# Patient Record
Sex: Female | Born: 1970 | Race: White | Hispanic: No | Marital: Single | Smoking: Never smoker
Health system: Southern US, Community
[De-identification: ages and names within clinical notes are randomized; demographics above are authoritative.]

## PROBLEM LIST (undated history)

## (undated) DIAGNOSIS — F419 Anxiety disorder, unspecified: Secondary | ICD-10-CM

## (undated) DIAGNOSIS — T7840XA Allergy, unspecified, initial encounter: Secondary | ICD-10-CM

## (undated) DIAGNOSIS — D649 Anemia, unspecified: Secondary | ICD-10-CM

## (undated) DIAGNOSIS — I10 Essential (primary) hypertension: Secondary | ICD-10-CM

## (undated) DIAGNOSIS — M1990 Unspecified osteoarthritis, unspecified site: Secondary | ICD-10-CM

## (undated) DIAGNOSIS — M4620 Osteomyelitis of vertebra, site unspecified: Secondary | ICD-10-CM

## (undated) DIAGNOSIS — F32A Depression, unspecified: Secondary | ICD-10-CM

## (undated) DIAGNOSIS — I351 Nonrheumatic aortic (valve) insufficiency: Secondary | ICD-10-CM

## (undated) DIAGNOSIS — D689 Coagulation defect, unspecified: Secondary | ICD-10-CM

## (undated) DIAGNOSIS — E669 Obesity, unspecified: Secondary | ICD-10-CM

## (undated) DIAGNOSIS — Z87442 Personal history of urinary calculi: Secondary | ICD-10-CM

## (undated) DIAGNOSIS — J189 Pneumonia, unspecified organism: Secondary | ICD-10-CM

## (undated) DIAGNOSIS — K219 Gastro-esophageal reflux disease without esophagitis: Secondary | ICD-10-CM

## (undated) DIAGNOSIS — N200 Calculus of kidney: Secondary | ICD-10-CM

## (undated) HISTORY — DX: Obesity, unspecified: E66.9

## (undated) HISTORY — PX: TUBAL LIGATION: SHX77

## (undated) HISTORY — DX: Depression, unspecified: F32.A

## (undated) HISTORY — DX: Unspecified osteoarthritis, unspecified site: M19.90

## (undated) HISTORY — PX: LITHOTRIPSY: SUR834

## (undated) HISTORY — PX: OTHER SURGICAL HISTORY: SHX169

## (undated) HISTORY — DX: Gastro-esophageal reflux disease without esophagitis: K21.9

## (undated) HISTORY — DX: Anxiety disorder, unspecified: F41.9

## (undated) HISTORY — DX: Allergy, unspecified, initial encounter: T78.40XA

## (undated) HISTORY — PX: CARPAL TUNNEL RELEASE: SHX101

## (undated) HISTORY — DX: Coagulation defect, unspecified: D68.9

## (undated) HISTORY — PX: BACK SURGERY: SHX140

## (undated) HISTORY — PX: CHOLECYSTECTOMY: SHX55

## (undated) HISTORY — PX: ABDOMINAL HYSTERECTOMY: SHX81

---

## 2004-07-10 ENCOUNTER — Emergency Department: Payer: Self-pay | Admitting: Unknown Physician Specialty

## 2004-11-05 ENCOUNTER — Emergency Department: Payer: Self-pay | Admitting: Unknown Physician Specialty

## 2005-11-15 ENCOUNTER — Emergency Department: Payer: Self-pay | Admitting: Emergency Medicine

## 2005-11-26 ENCOUNTER — Emergency Department: Payer: Self-pay | Admitting: Emergency Medicine

## 2005-11-26 IMAGING — CR DG CHEST 2V
1 series · 2 of 2 positions shown · non-contrast
Comparison: none

REASON FOR EXAM: Cough, possible pneumonia
COMMENTS:

PROCEDURE:     DXR - DXR CHEST PA (OR AP) AND LATERAL  - [DATE]  [DATE]
RESULT:          The lungs are adequately inflated.  There is no focal
infiltrate.  The heart is not enlarged.  The pulmonary vascularity is not
engorged.  There is no pleural effusion.

[Series 4038: postero_anterior · 0.22mm/px · 2 of 2 slices shown]
[im 1/2]
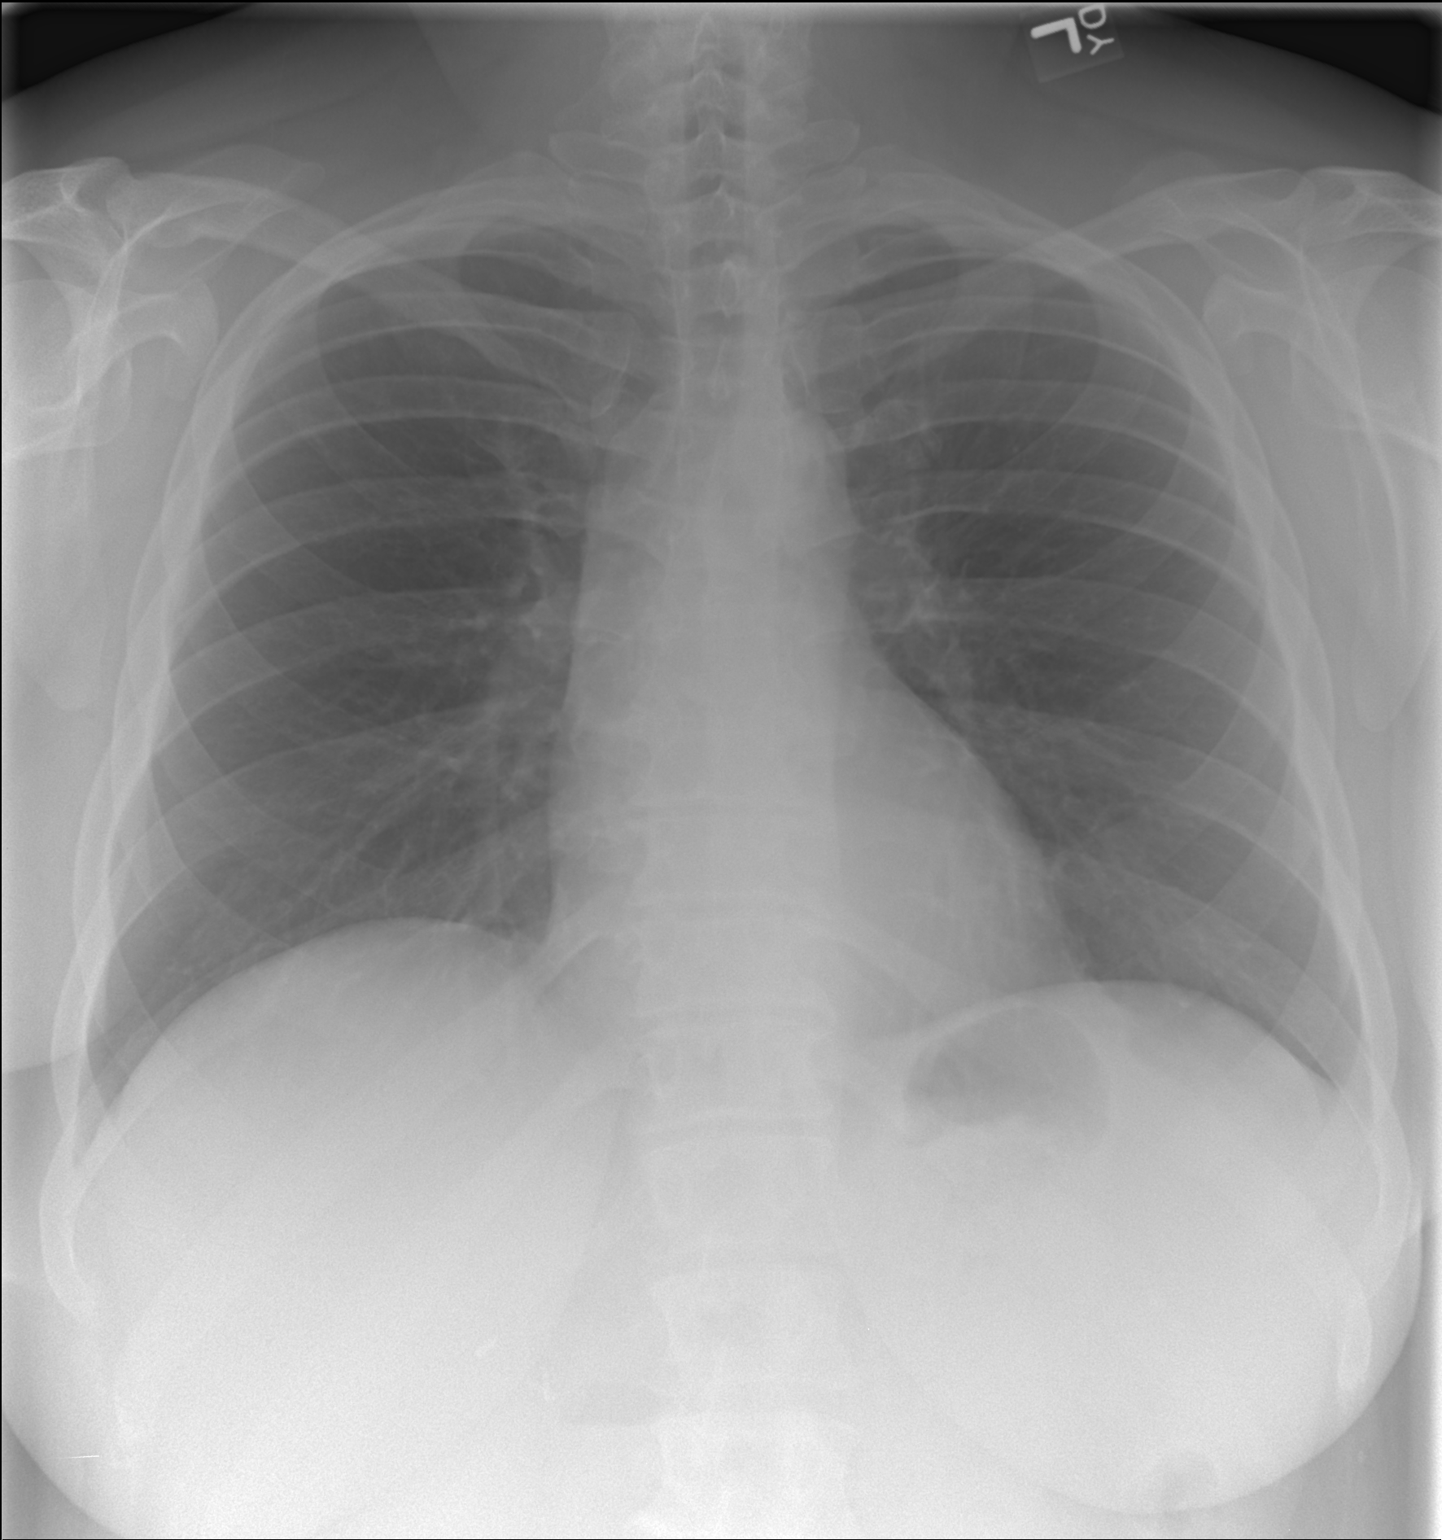
[im 2/2]
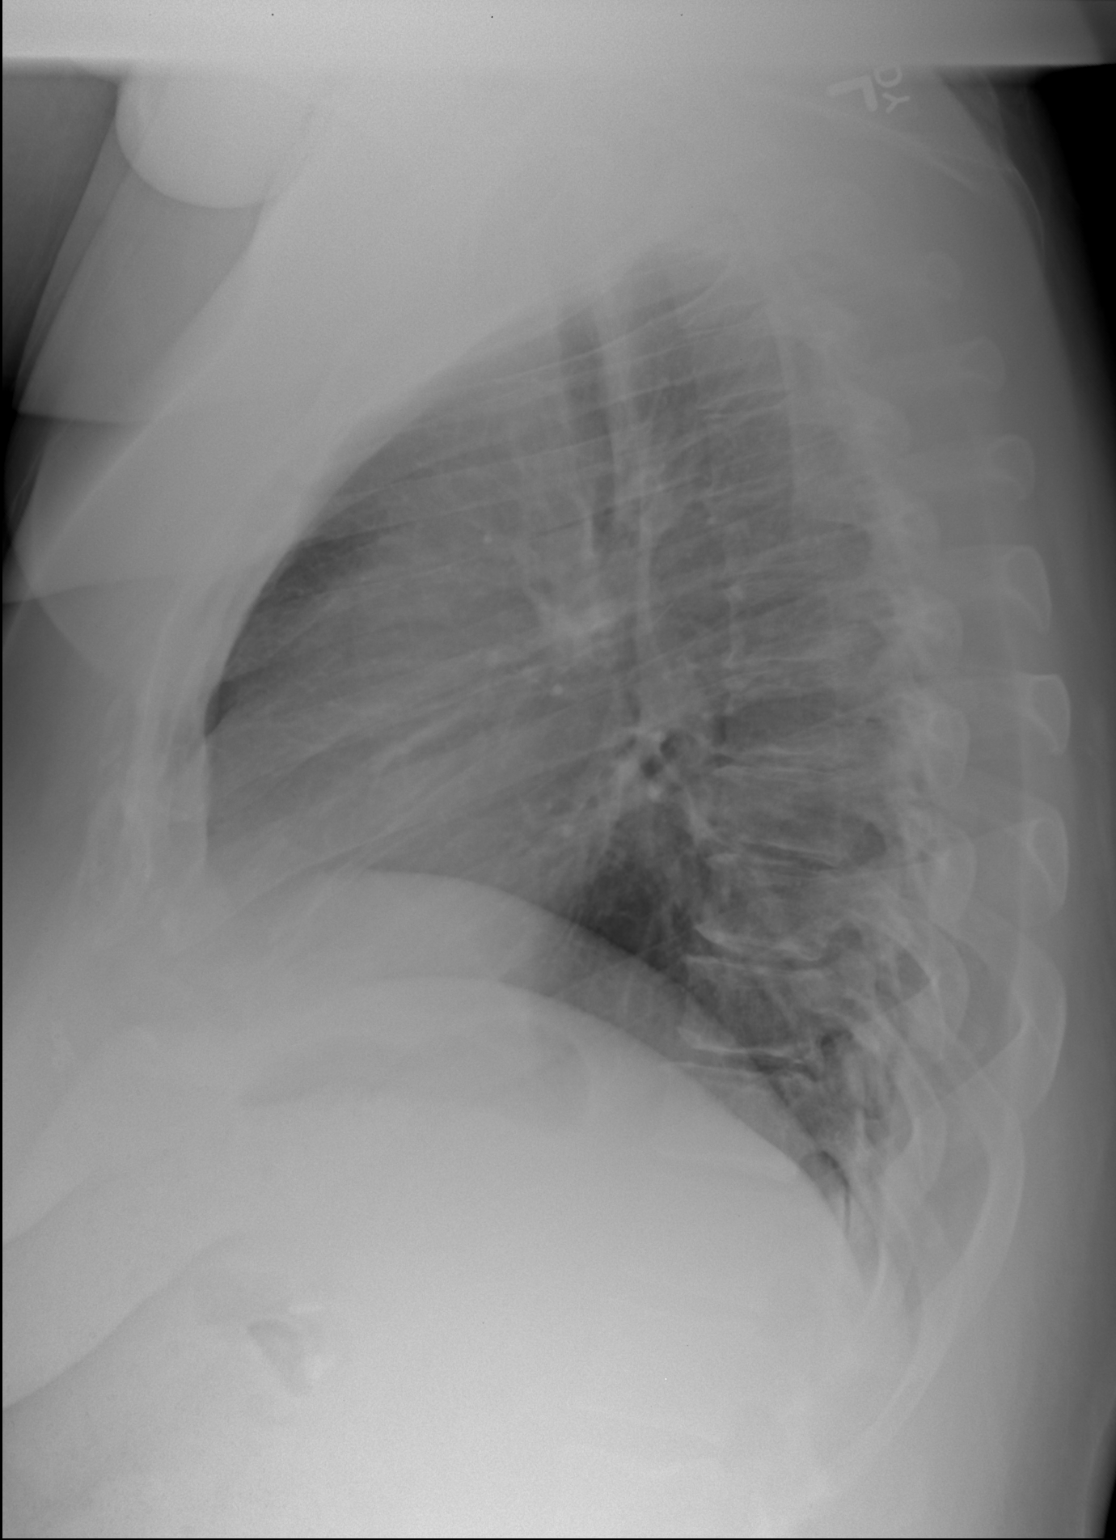

[2 of 2 positions shown; findings below may reference images not displayed]

IMPRESSION: I do not see evidence of acute cardiopulmonary disease.

## 2006-01-08 ENCOUNTER — Emergency Department: Payer: Self-pay | Admitting: Emergency Medicine

## 2006-01-11 ENCOUNTER — Emergency Department: Payer: Self-pay | Admitting: Emergency Medicine

## 2006-01-11 ENCOUNTER — Other Ambulatory Visit: Payer: Self-pay

## 2006-01-11 IMAGING — CR DG CHEST 1V PORT
1 series · 1 of 1 positions shown · non-contrast
Comparison: none

REASON FOR EXAM: Shortness of breath
COMMENTS:

PROCEDURE:     DXR - DXR PORTABLE CHEST SINGLE VIEW  - [DATE]  [DATE]
RESULT:     Comparison is made to a study of [DATE].
There is no infiltrate or effusion. There is no pneumothorax. The heart and
pulmonary vessels appear to be normal.

[view not recorded]
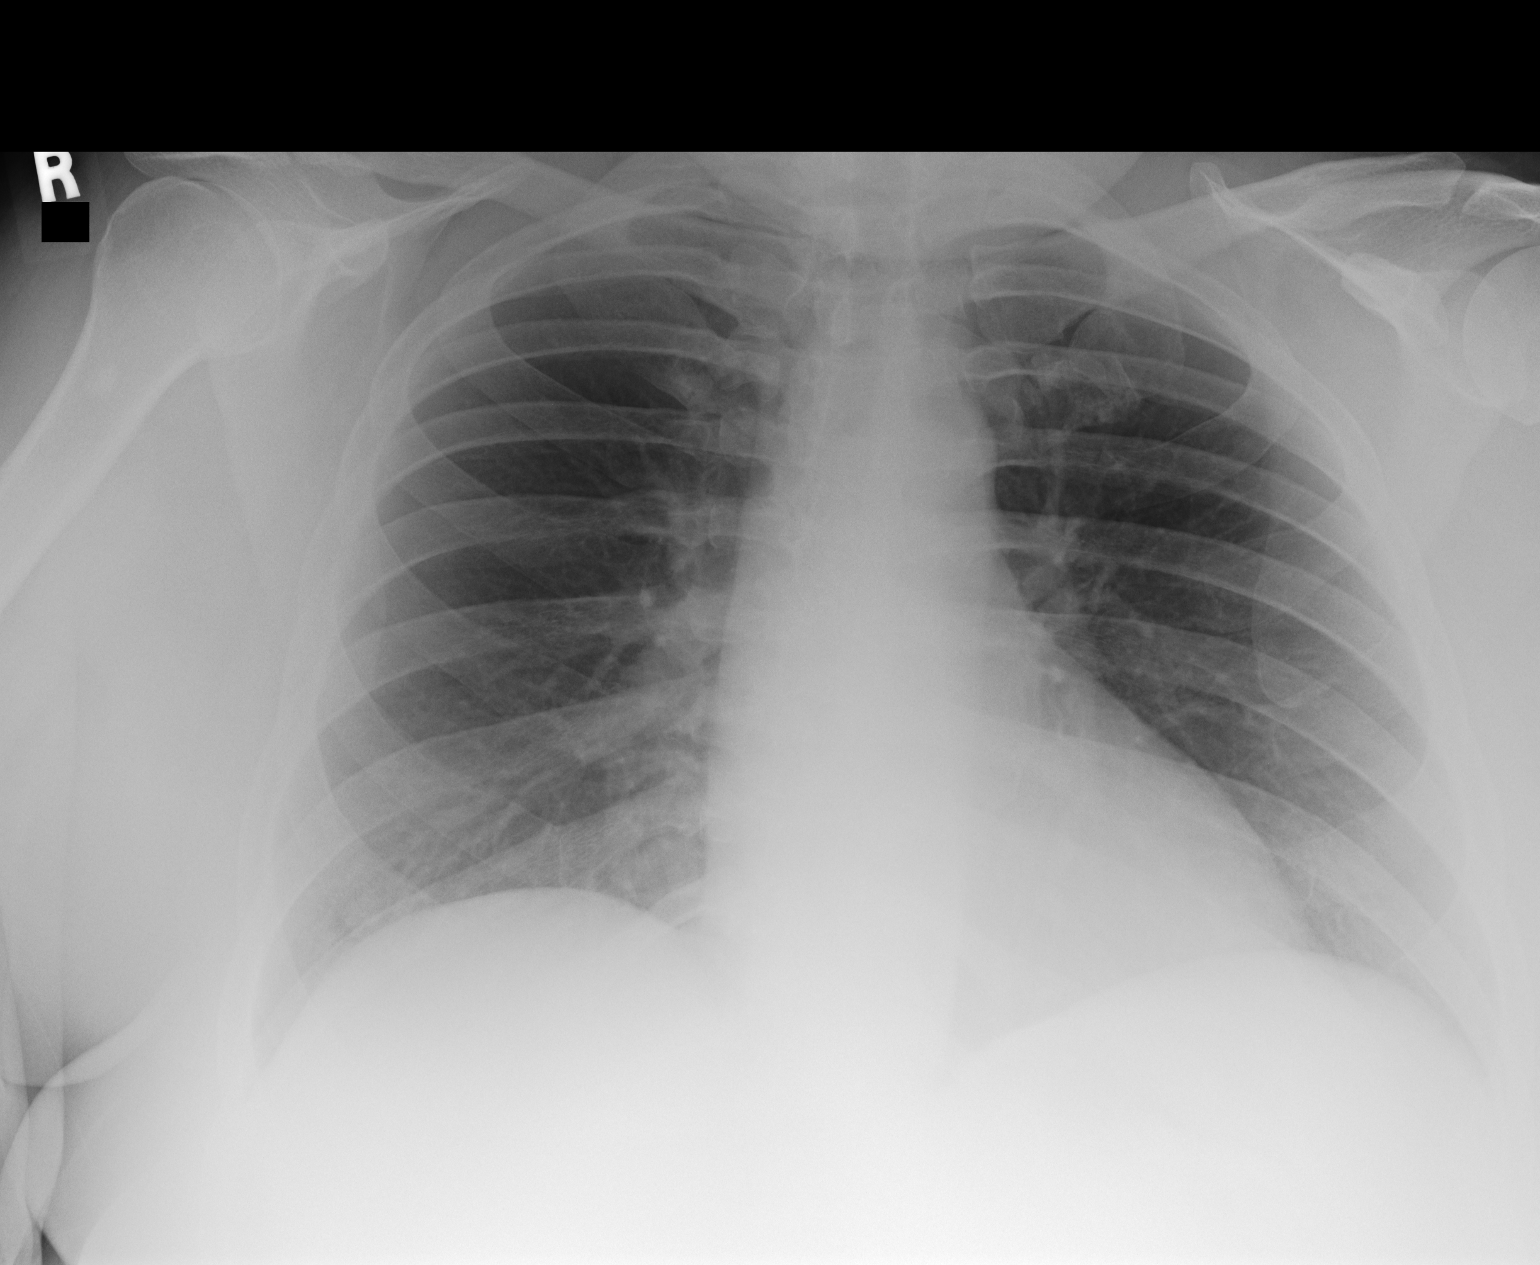

[1 of 1 positions shown; findings below may reference images not displayed]

IMPRESSION: No acute cardiopulmonary disease evident.

## 2006-07-15 ENCOUNTER — Emergency Department: Payer: Self-pay | Admitting: Unknown Physician Specialty

## 2007-07-02 ENCOUNTER — Emergency Department: Payer: Self-pay | Admitting: Emergency Medicine

## 2007-09-06 ENCOUNTER — Emergency Department: Payer: Self-pay | Admitting: Emergency Medicine

## 2007-10-08 DIAGNOSIS — F419 Anxiety disorder, unspecified: Secondary | ICD-10-CM | POA: Insufficient documentation

## 2009-09-09 DIAGNOSIS — M5090 Cervical disc disorder, unspecified, unspecified cervical region: Secondary | ICD-10-CM

## 2009-09-09 HISTORY — DX: Cervical disc disorder, unspecified, unspecified cervical region: M50.90

## 2009-10-20 ENCOUNTER — Emergency Department: Payer: Self-pay | Admitting: Emergency Medicine

## 2009-10-20 IMAGING — CR DG CLAVICLE*L*
1 series · 3 of 3 positions shown · non-contrast
Comparison: none

REASON FOR EXAM: pain medially   fall
COMMENTS:

PROCEDURE:     DXR - DXR CLAVICLE LEFT  - [DATE]  [DATE]
RESULT:     Three views of the left clavicle reveal the bones to be
adequately mineralized. I do not see evidence of an acute fracture. The AC
joint is grossly intact.

[Series 1: view not recorded · 0.17mm/px · 3 of 3 slices shown]
[im 1/3]
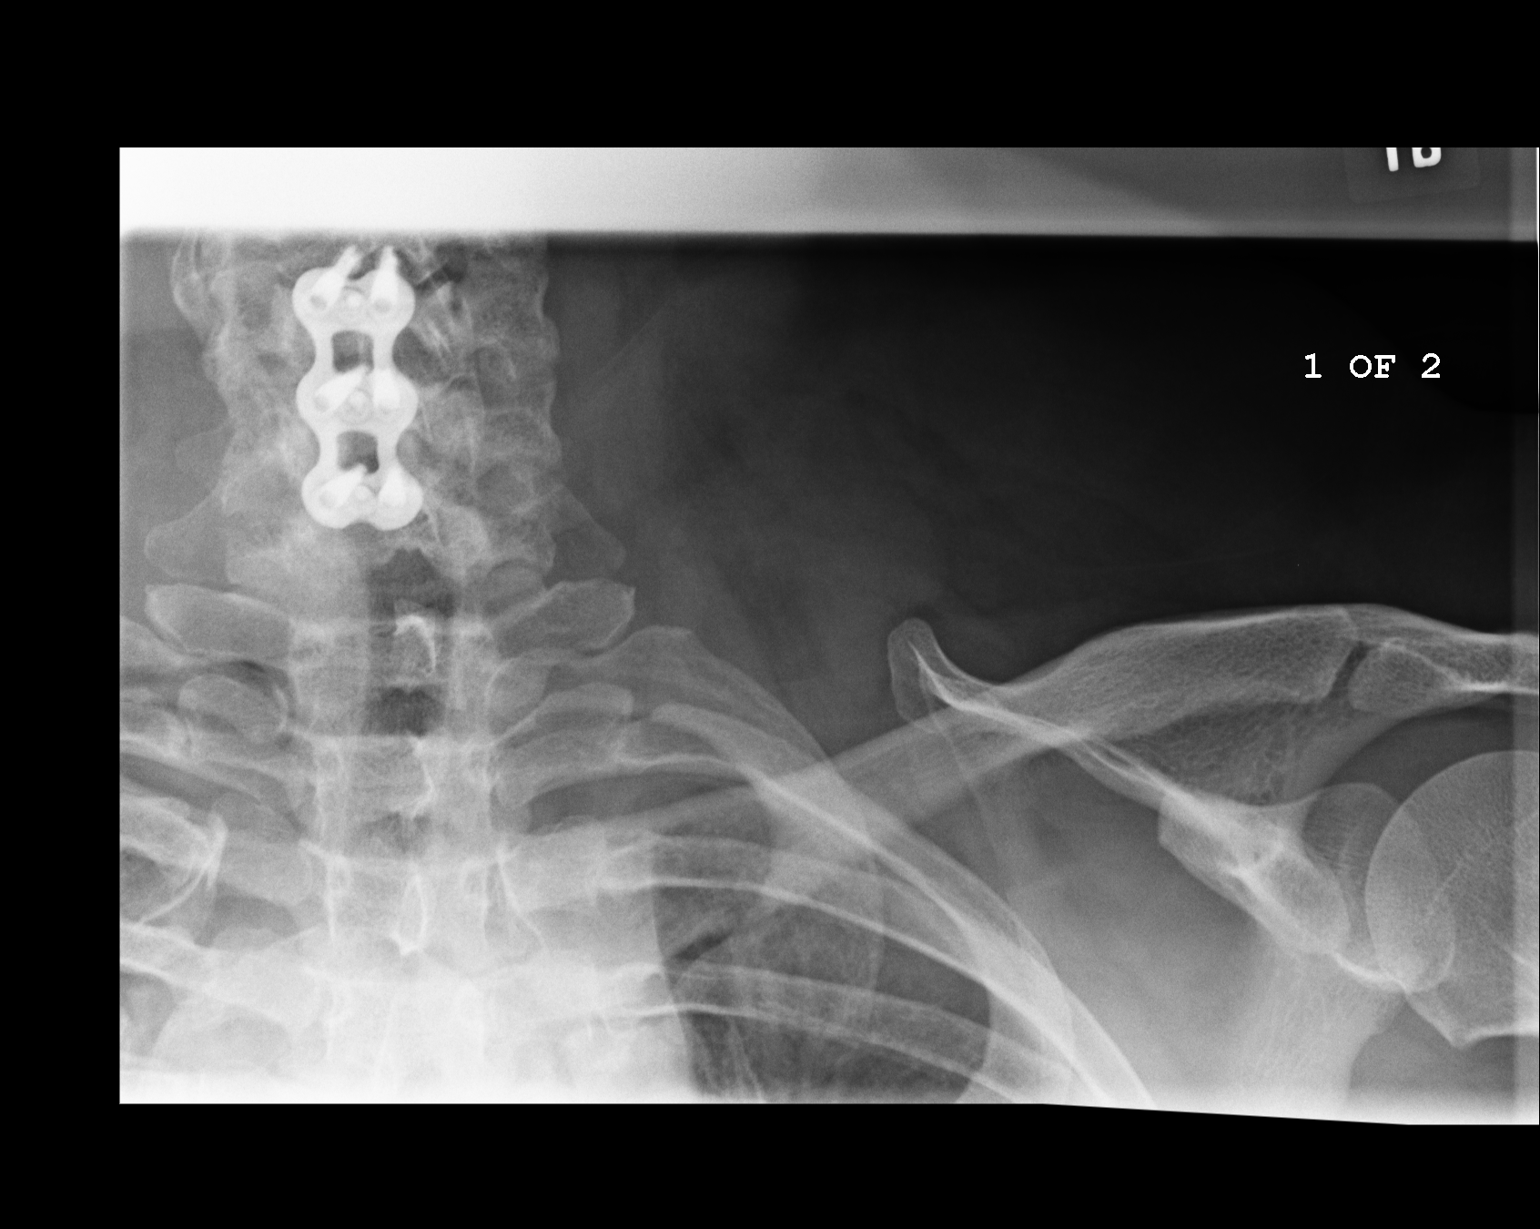
[im 2/3]
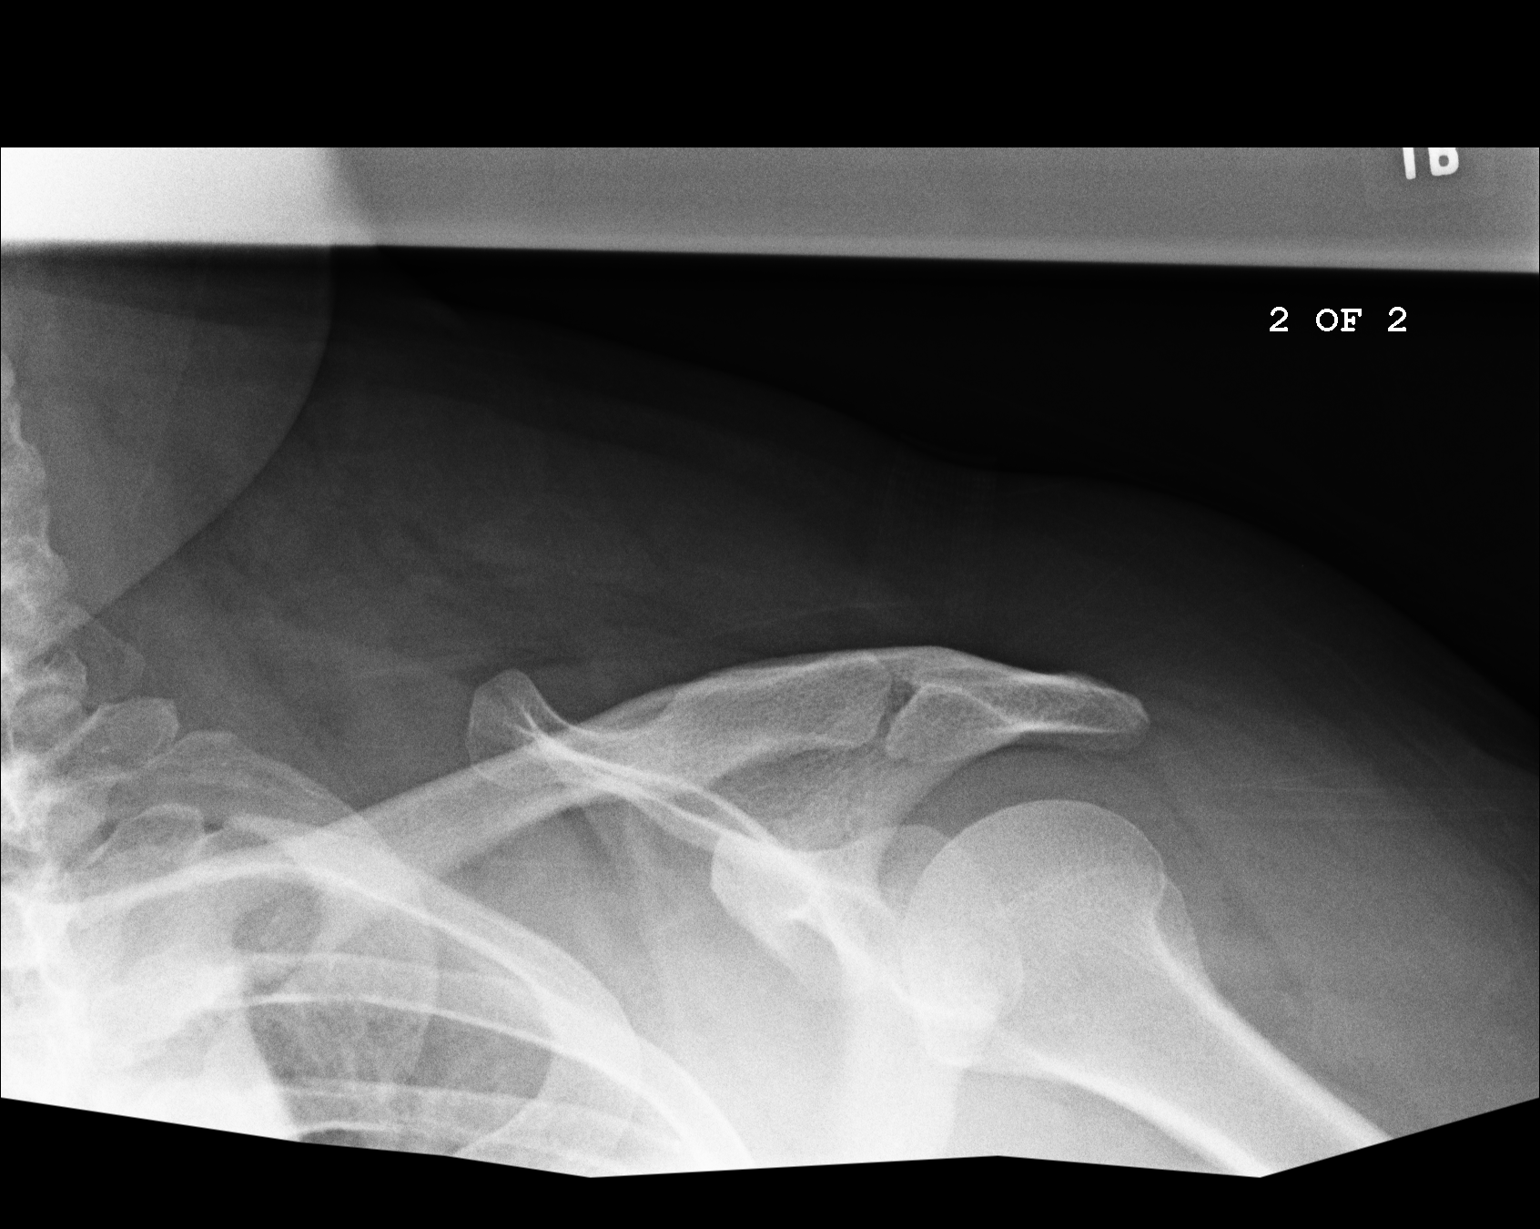
[im 3/3]
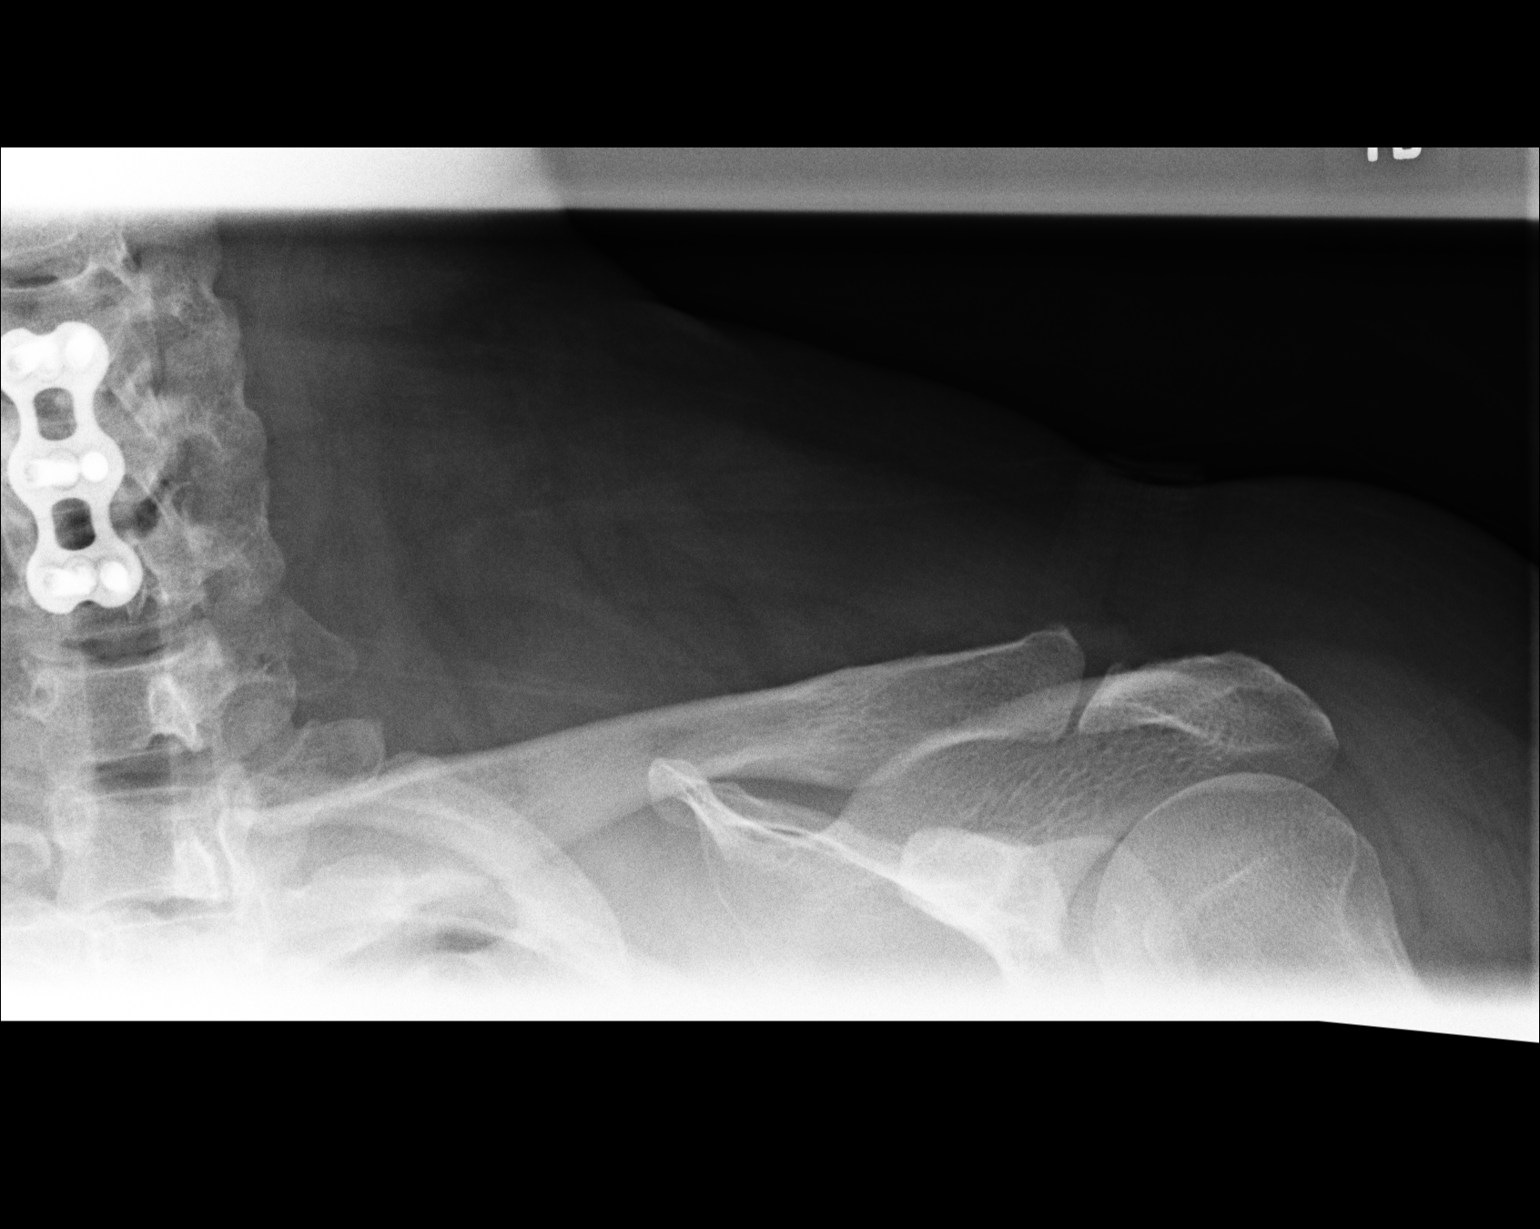

[3 of 3 positions shown; findings below may reference images not displayed]

IMPRESSION: I do not see acute abnormality of the left clavicle.

## 2009-10-20 IMAGING — CR RIGHT HAND - COMPLETE 3+ VIEW
1 series · 3 of 3 positions shown · non-contrast
Comparison: none

REASON FOR EXAM: pain
COMMENTS:

[Series 1: view not recorded · 0.17mm/px · 3 of 3 slices shown]
[im 1/3]
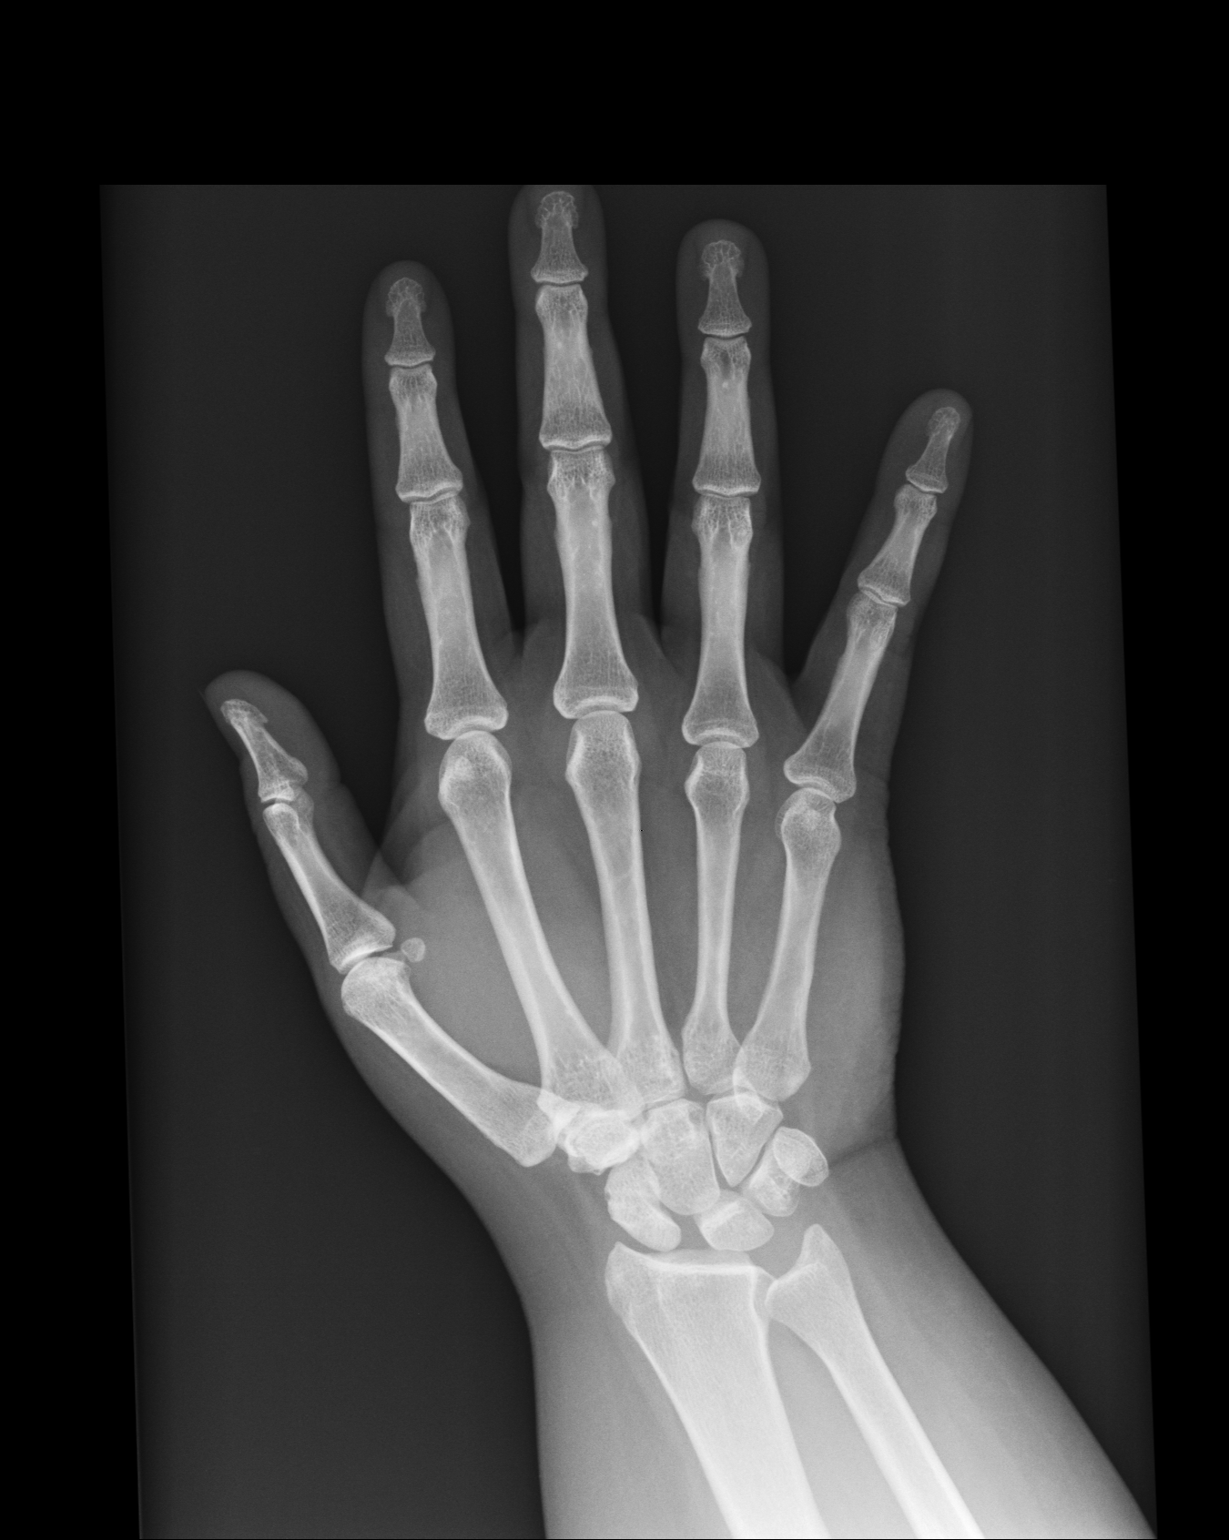
[im 2/3]
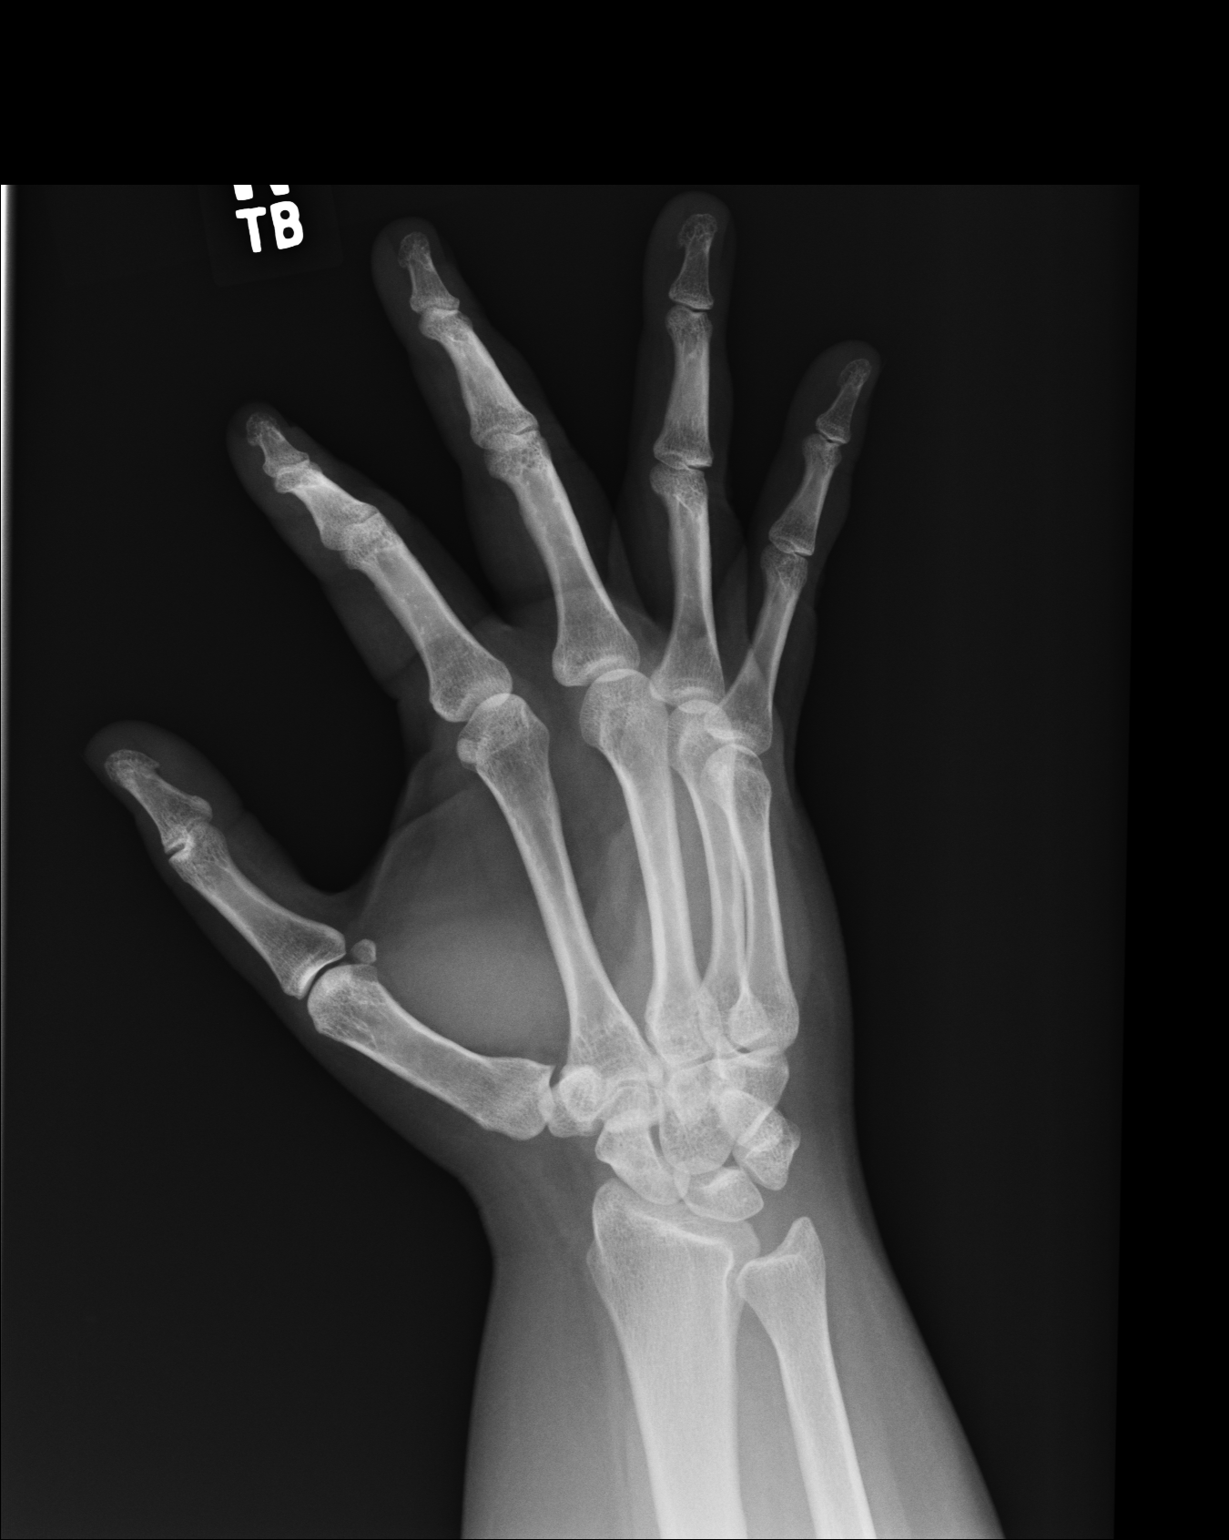
[im 3/3]
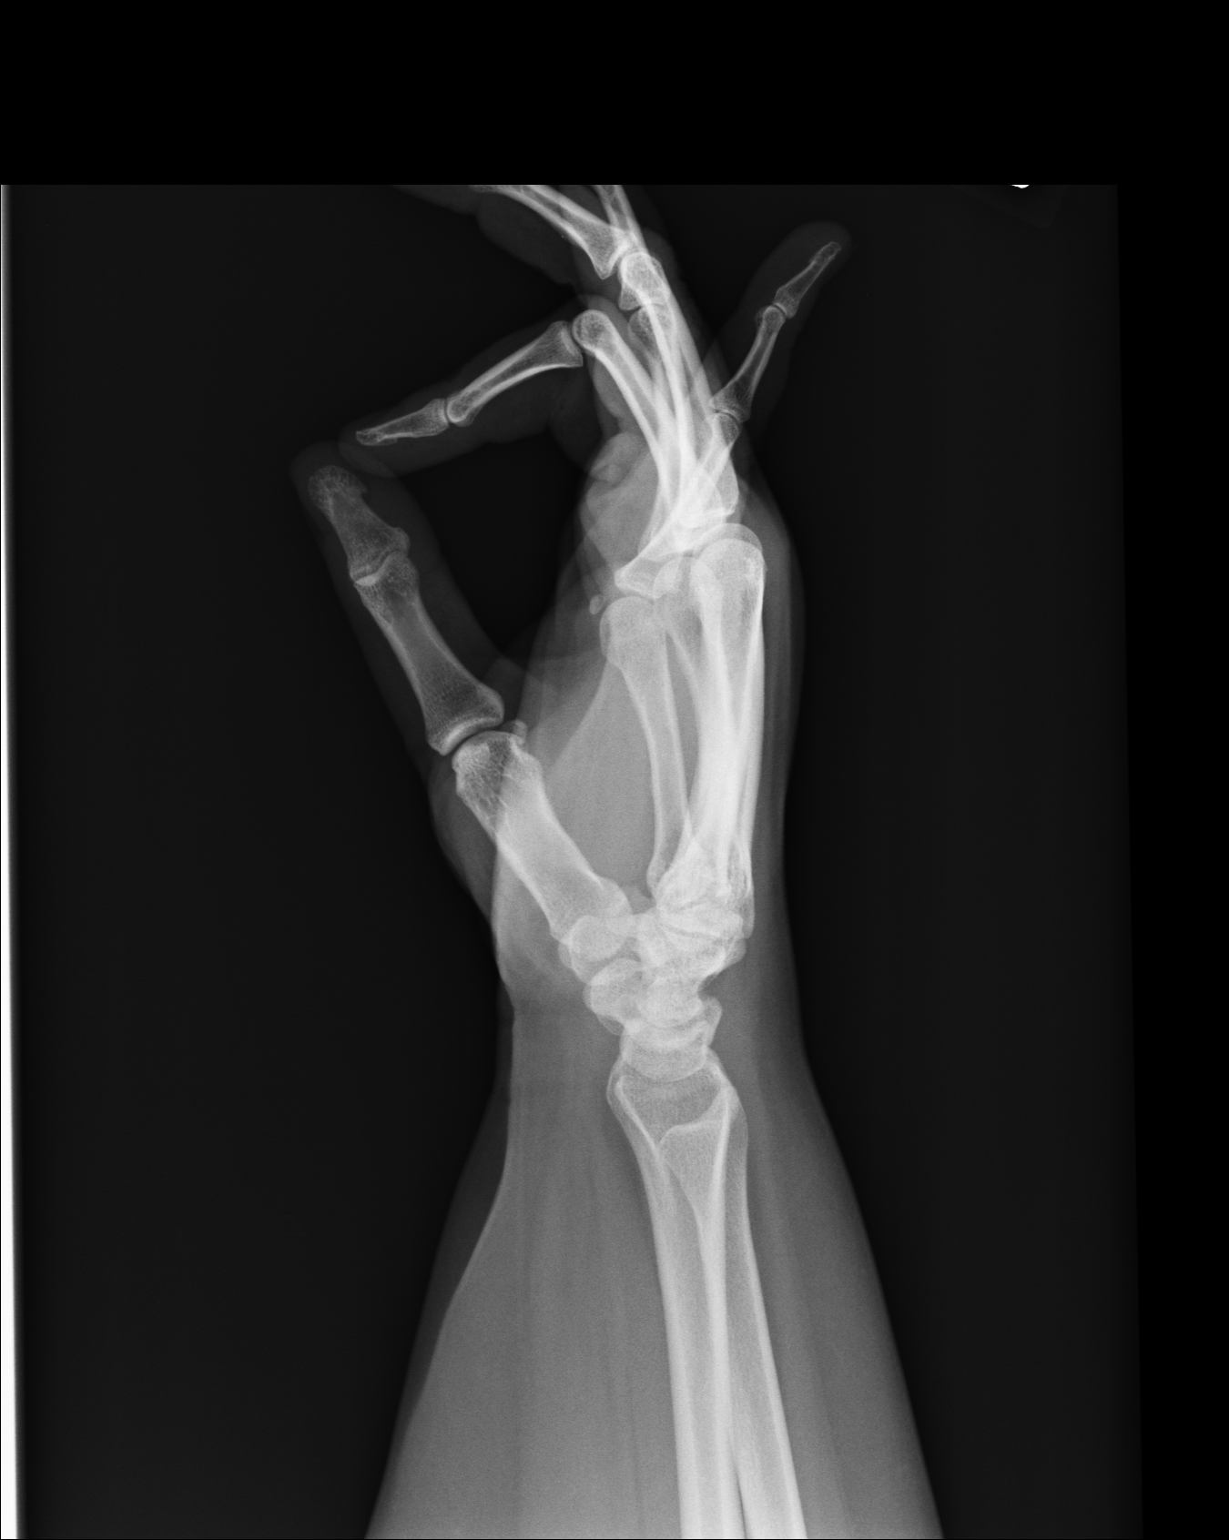

[3 of 3 positions shown; findings below may reference images not displayed]

PROCEDURE:     DXR - DXR HAND RT COMPLETE W/OBLIQUES  - [DATE]  [DATE]

RESULT:     Three views of the right hand reveal the bones to be adequately
mineralized. I do not see evidence of an acute fracture. The
metacarpophalangeal joints and the interphalangeal joints appear normal. The
overlying soft tissues exhibit no acute abnormality. The carpal bones appear
intact. Mild widening of the space between the proximal pole of the
navicular in the scaphoid is seen.
IMPRESSION: I do not see definite evidence of acute fracture of the
right hand. There is mild widening of the space between the proximal pole of
the navicular and the scaphoid. This is seen on one view only however.
Clinical note was made of a possible navicular fracture. A definite fracture
is not identified but a navicular series is recommended if there are strong
clinical concerns an of an occult fracture.

## 2009-10-20 IMAGING — CR DG KNEE COMPLETE 4+V*R*
1 series · 4 of 4 positions shown · non-contrast
Comparison: none

REASON FOR EXAM: pain
COMMENTS:

[Series 1: view not recorded · 0.17mm/px · 4 of 4 slices shown]
[im 1/4]
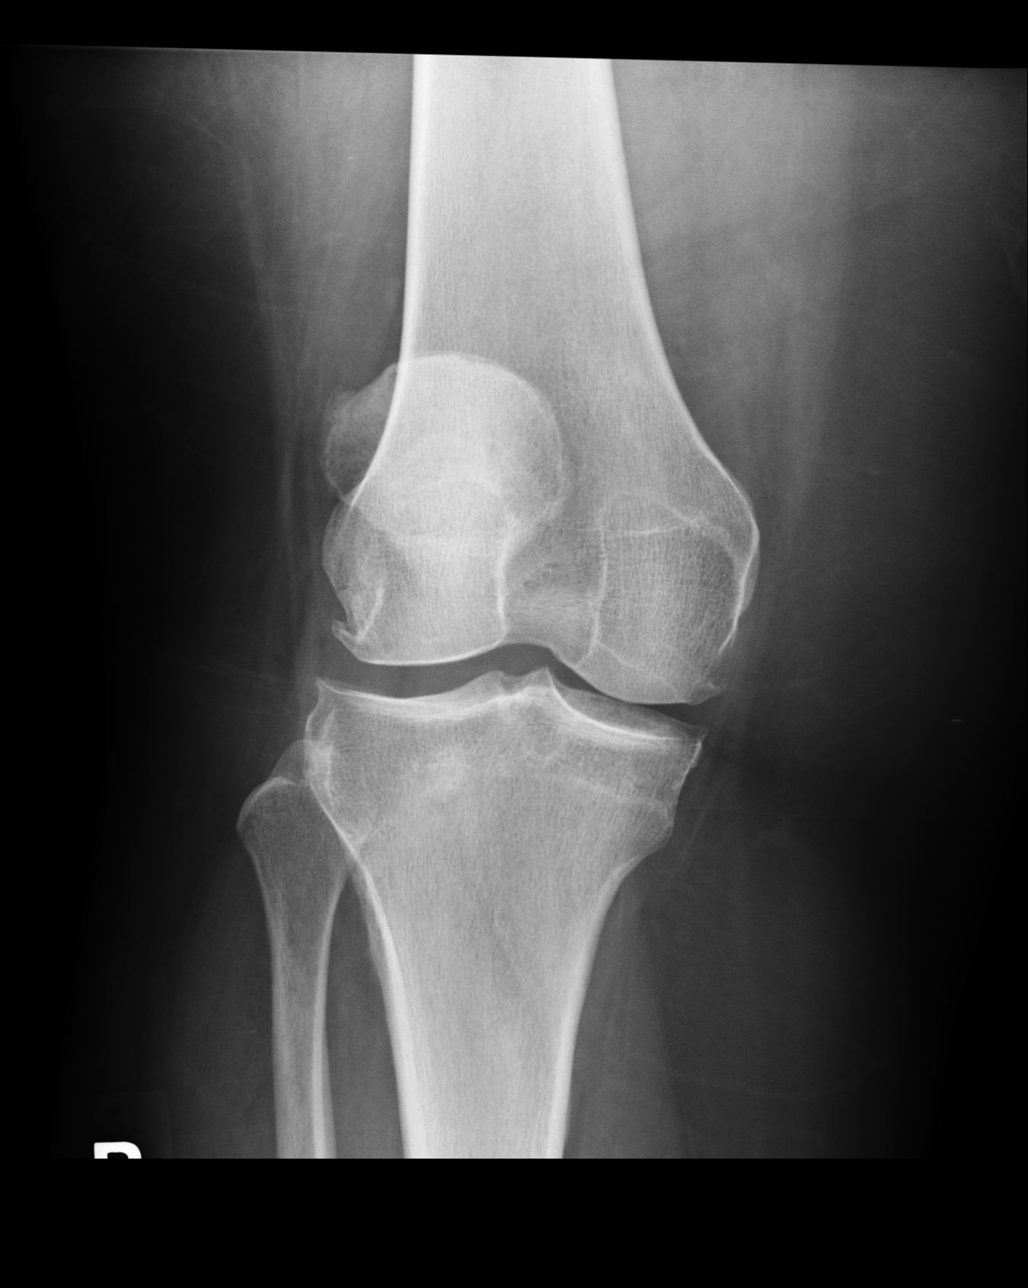
[im 2/4]
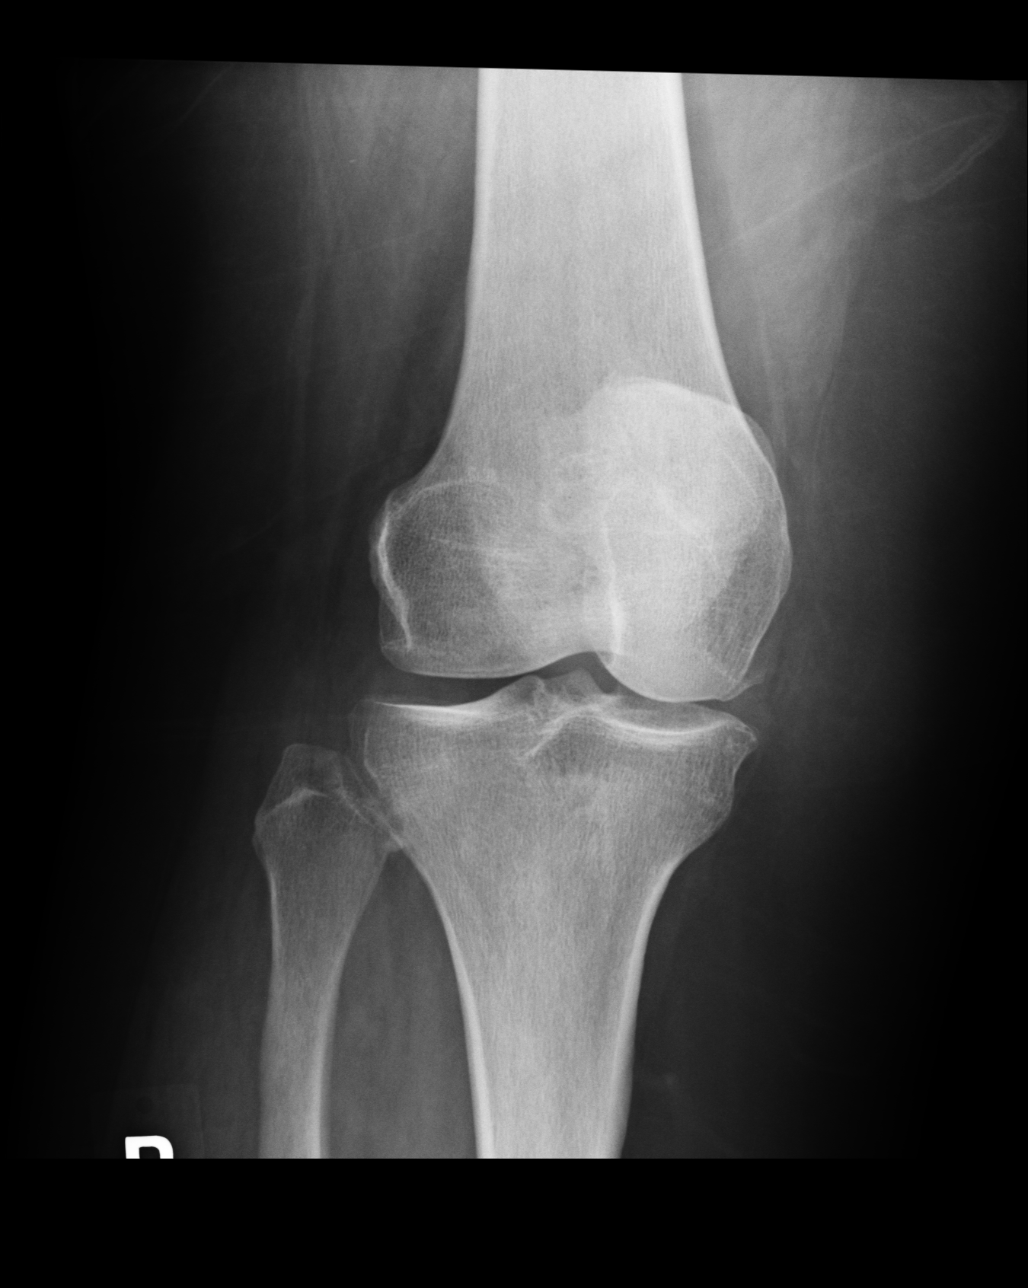
[im 3/4]
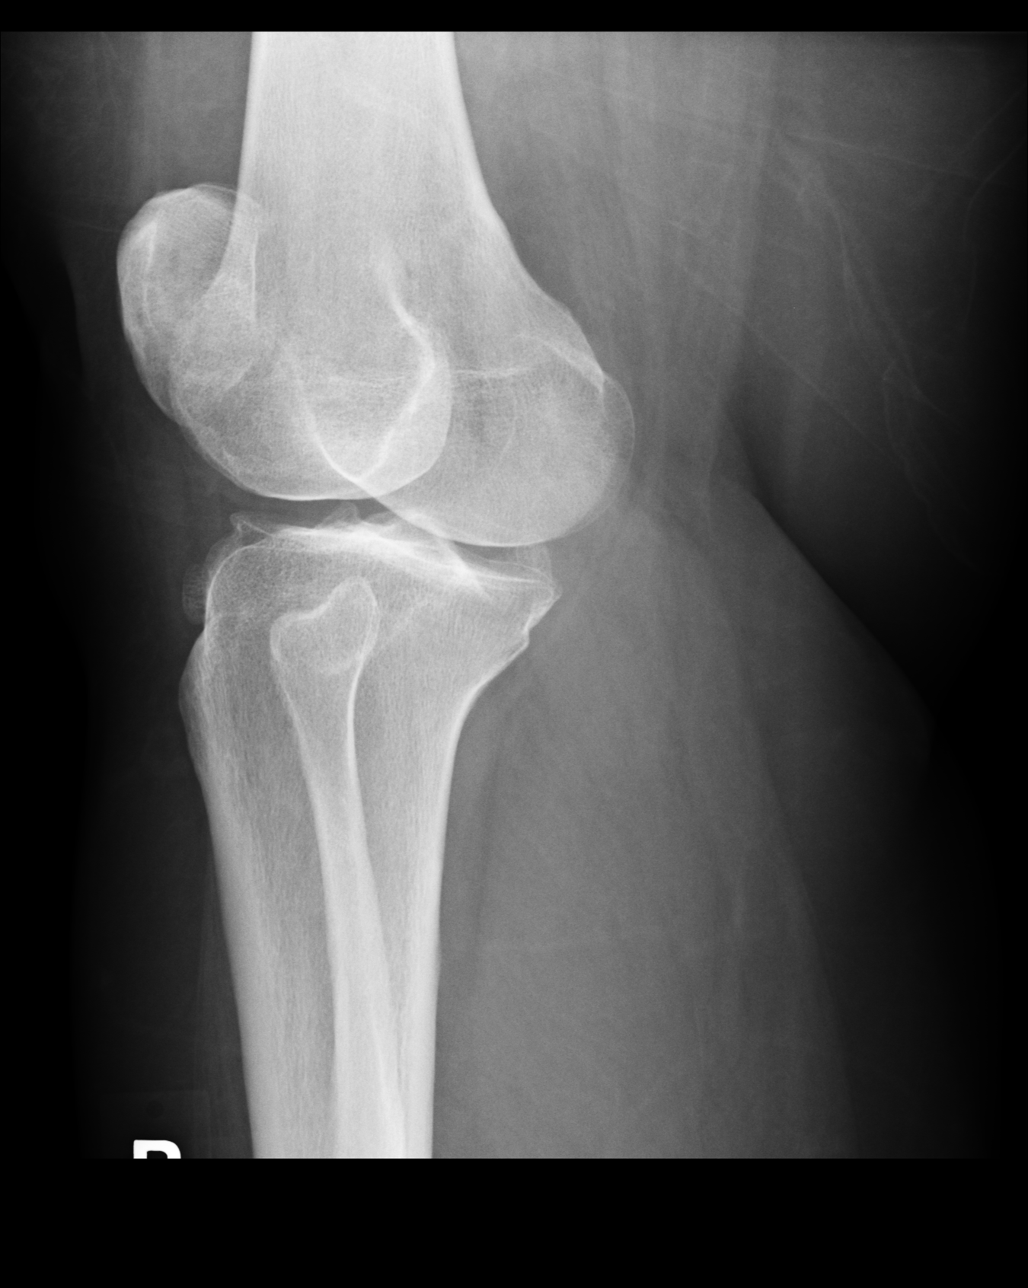
[im 4/4]
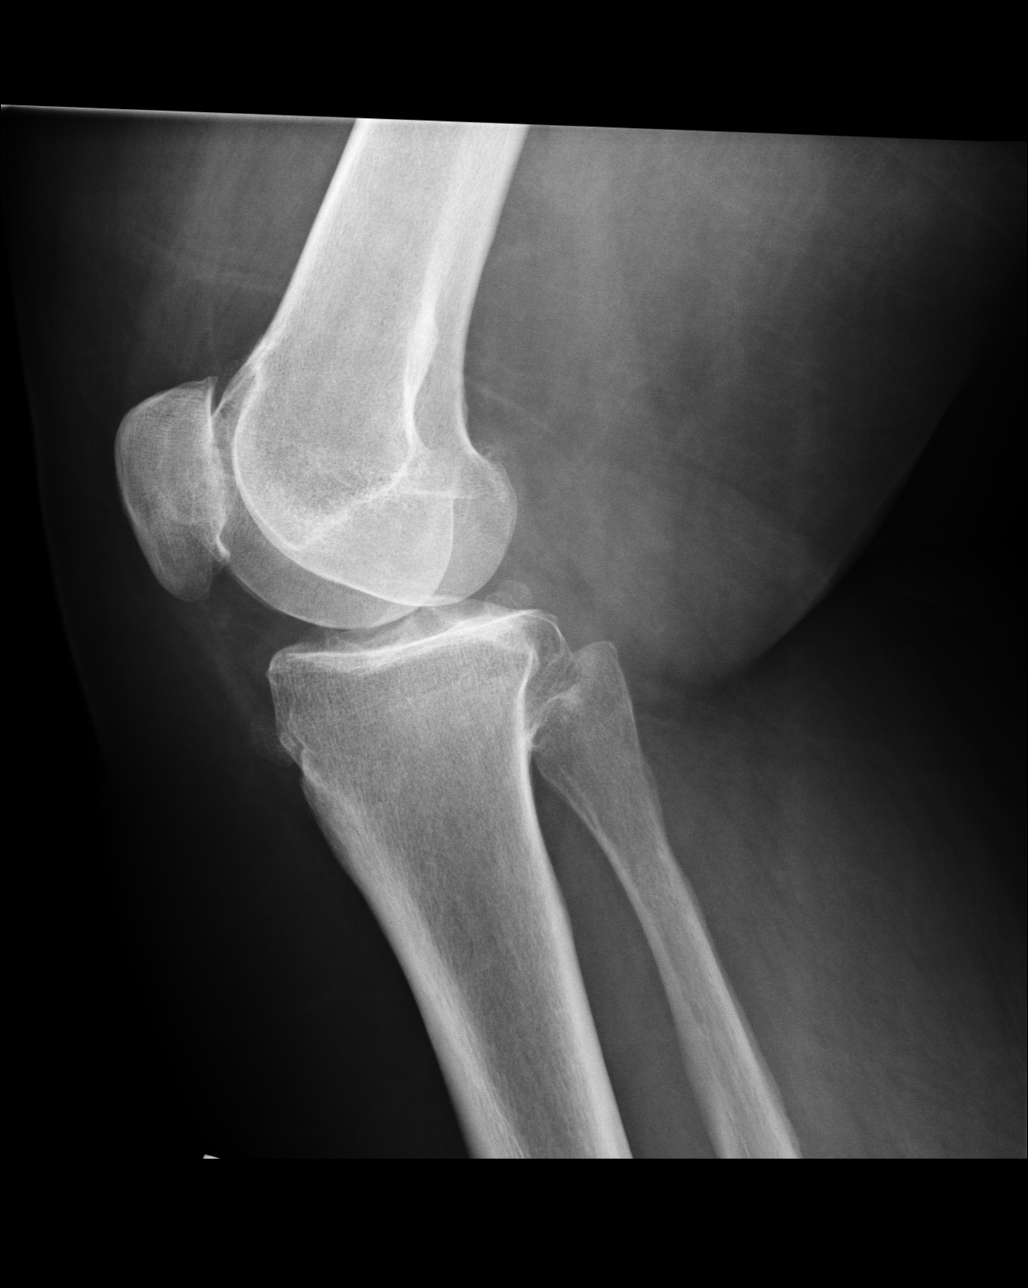

[4 of 4 positions shown; findings below may reference images not displayed]

PROCEDURE:     DXR - DXR KNEE RT COMP WITH OBLIQUES  - [DATE]  [DATE]

RESULT:     Four views of the right knee reveal the bones to be adequately
mineralized. Very mild beaking of the tibial spines and marginal osteophyte
formation is seen consistent with osteoarthritis. I do not see evidence of
an acute fracture nor dislocation.
IMPRESSION: I do not see acute bony abnormality of the right knee.
There are mild degenerative changes present.

## 2009-10-20 IMAGING — CR LEFT WRIST - COMPLETE 3+ VIEW
1 series · 4 of 4 positions shown · non-contrast
Comparison: none

REASON FOR EXAM: pain
COMMENTS:

[Series 1: view not recorded · 0.17mm/px · 4 of 4 slices shown]
[im 1/4]
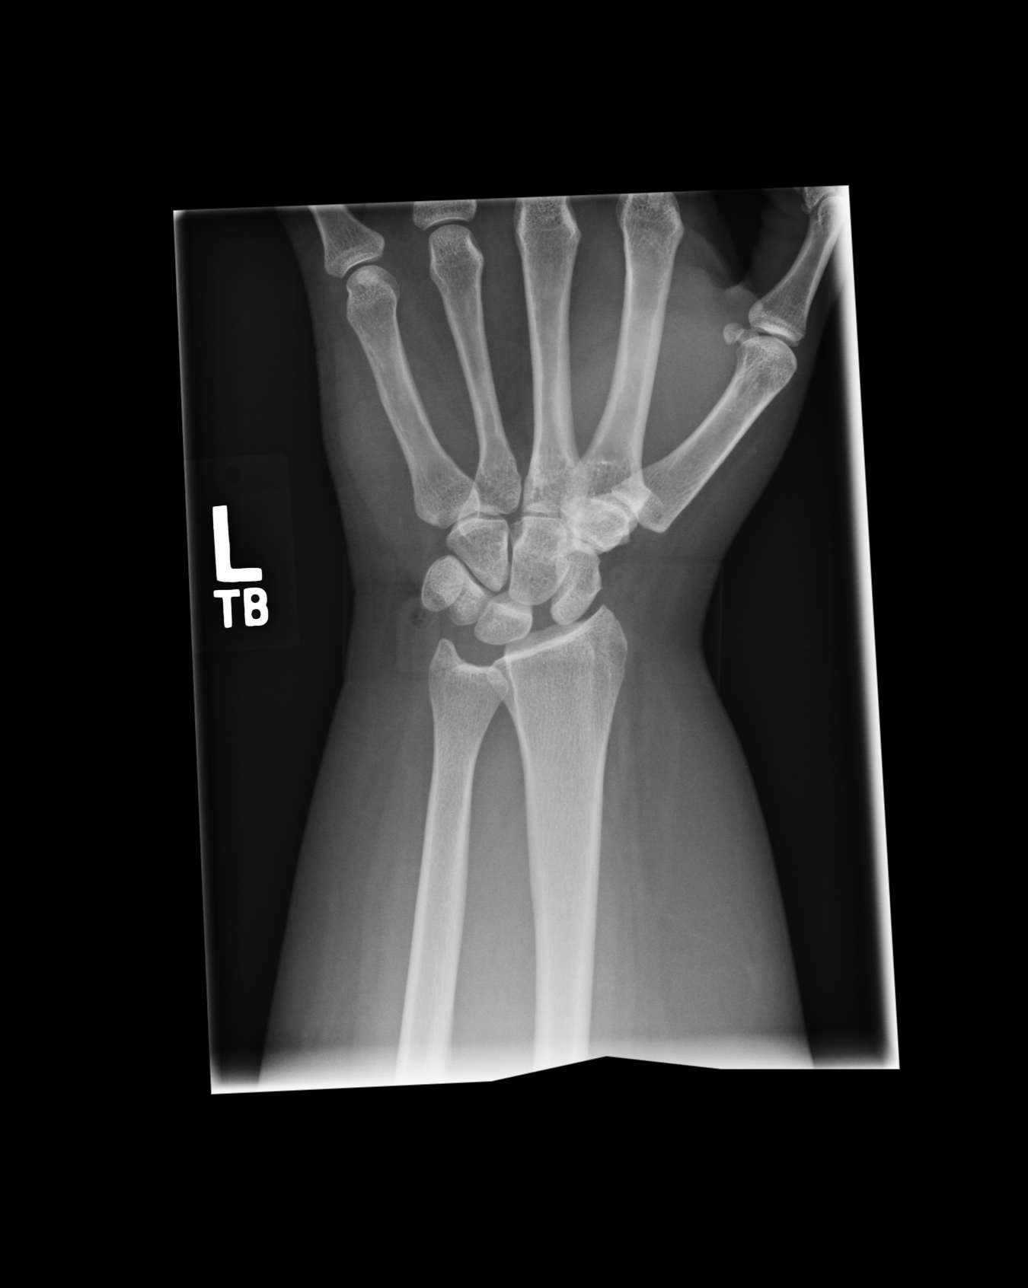
[im 2/4]
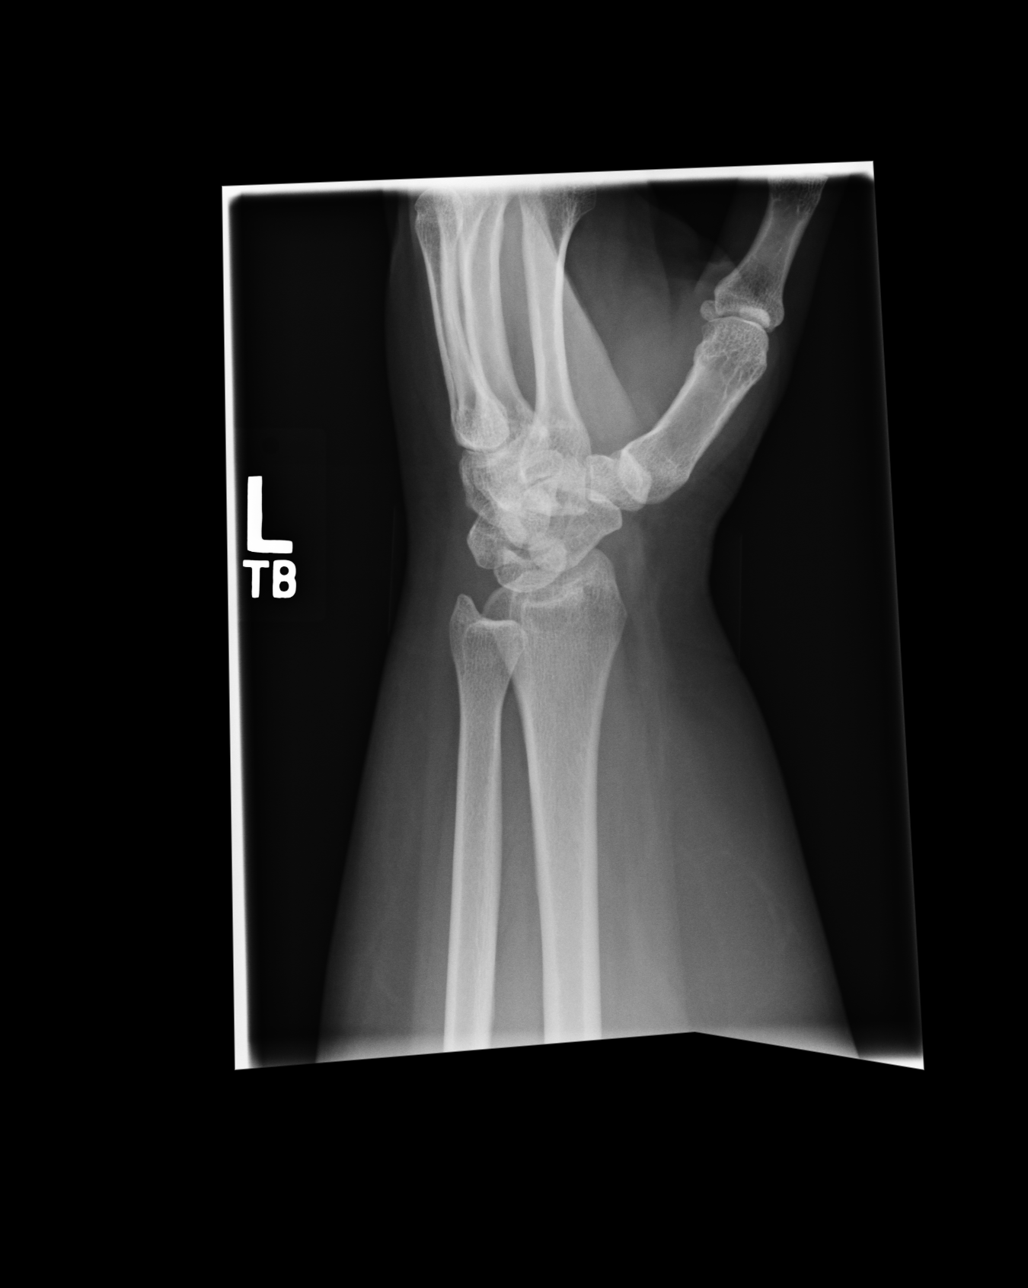
[im 3/4]
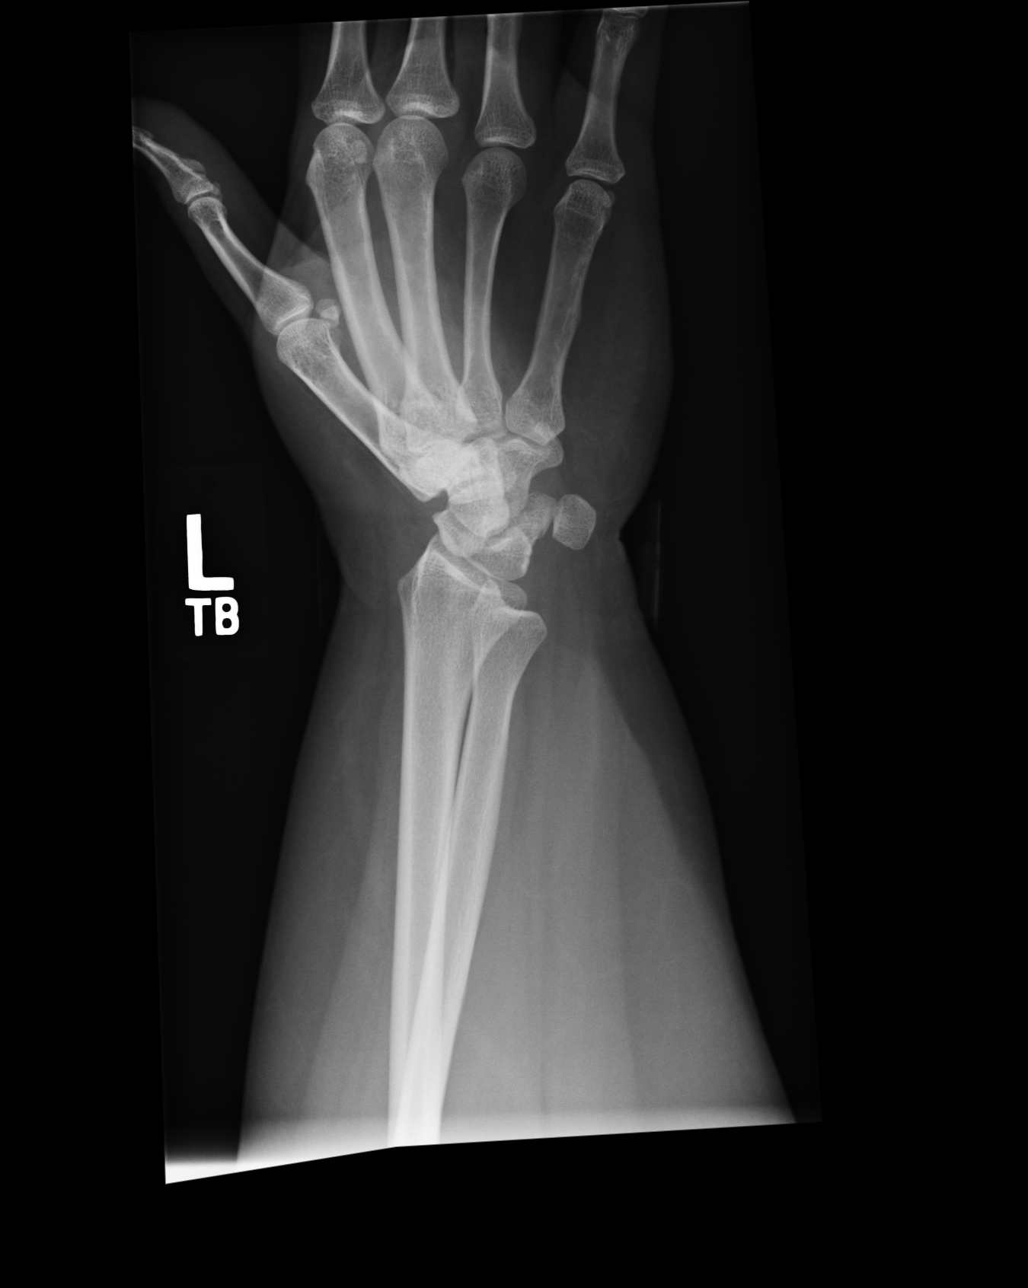
[im 4/4]
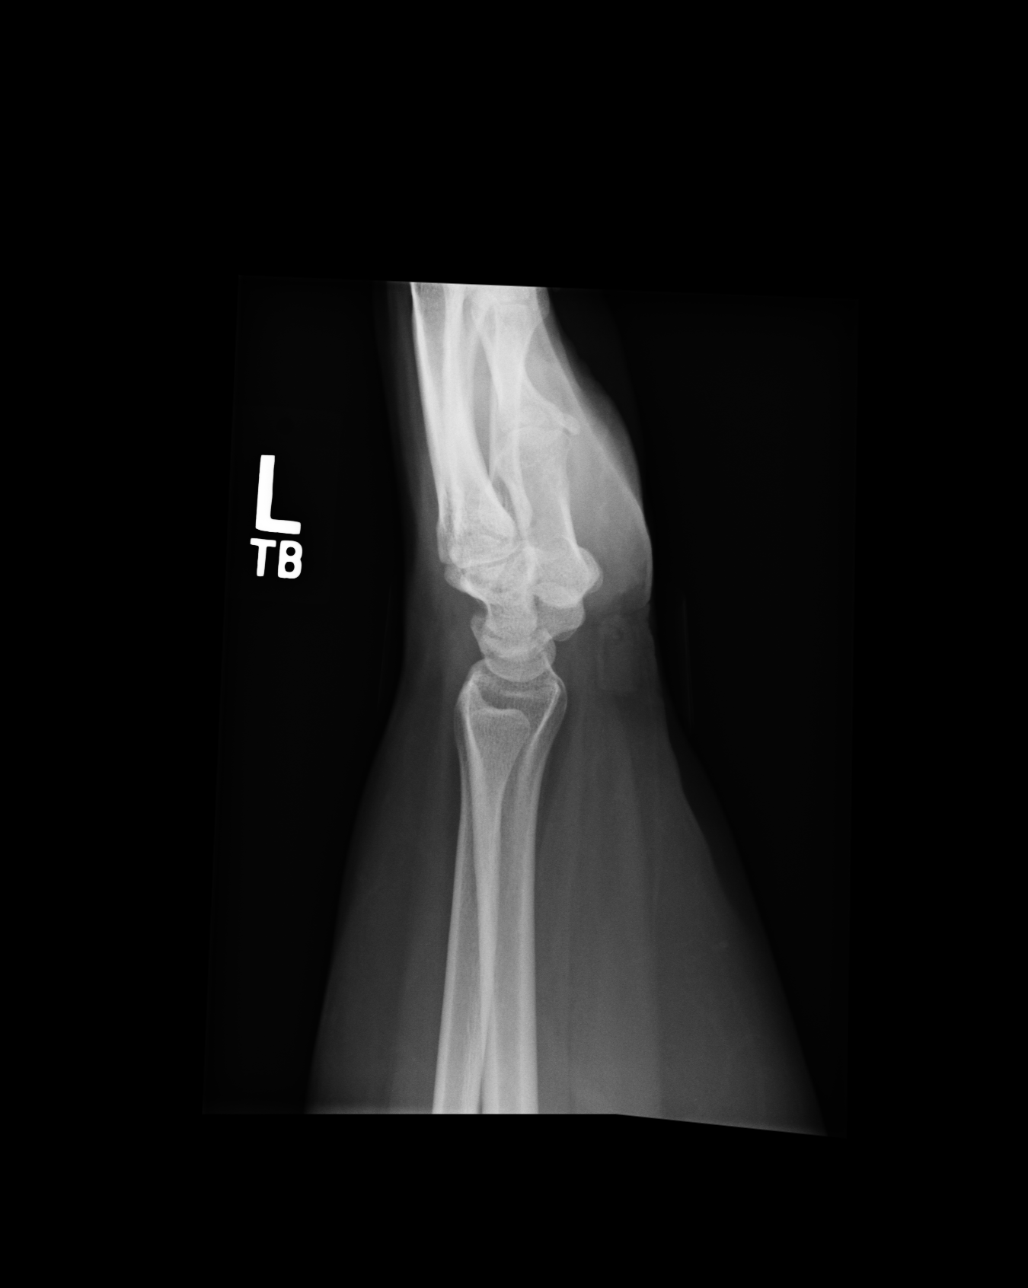

[4 of 4 positions shown; findings below may reference images not displayed]

PROCEDURE:     DXR - DXR WRIST LT COMP WITH OBLIQUES  - [DATE]  [DATE]

RESULT:     Images of the left wrist demonstrate lucency at the base of the
ulnar styloid process which may represent artifact or a nondisplaced or
incomplete fracture. Followup is recommended. The carpus appears to be
intact. There is some degenerative changes in the distal radius and slight
widening of the scapholunate joint is appreciated. Correlate for
scapholunate dissociation.
IMPRESSION: Please see above. Orthopedic followup is recommended.

## 2009-10-20 IMAGING — CT CT HEAD WITHOUT CONTRAST
1 series · 16 of 30 positions shown, 20 images · non-contrast
Comparison: none

REASON FOR EXAM: fall HA
COMMENTS:

[Series 2: soft tissue · axial · 0.40mm/px · z∈[+149,+299]mm · 16 of 34 slices shown, 20 images]
[im 2/34  brain]
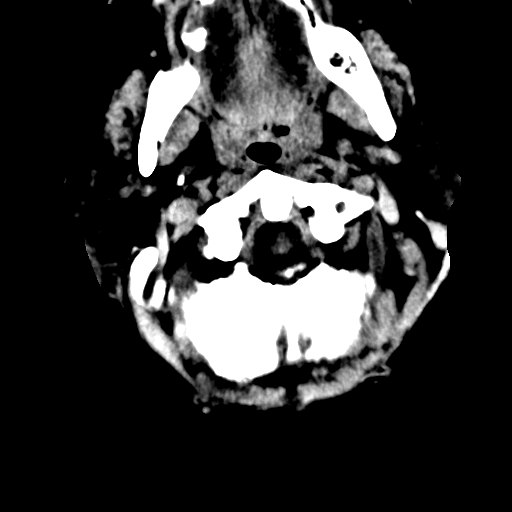
[im 2/34  bone]
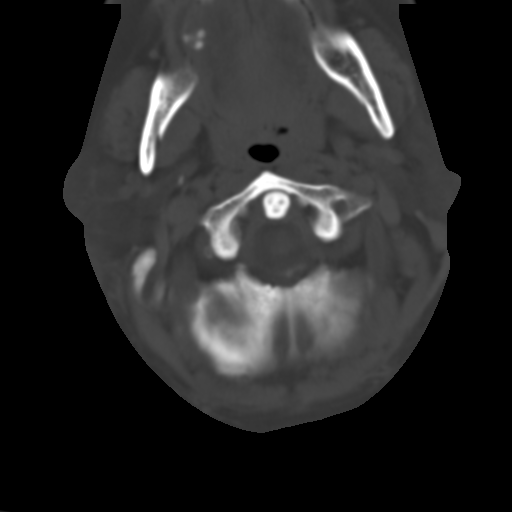
[im 4/34  brain]
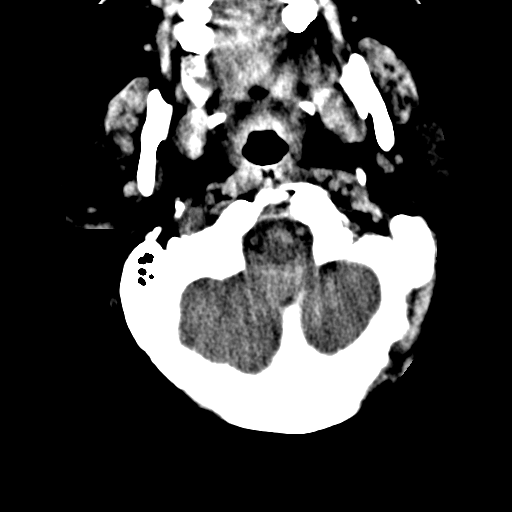
[im 6/34  brain]
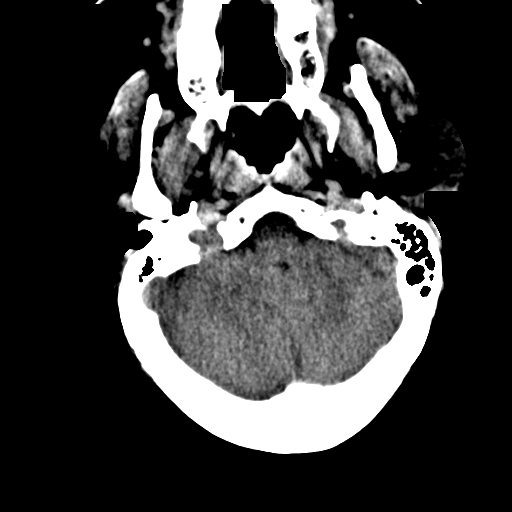
[im 8/34  brain]
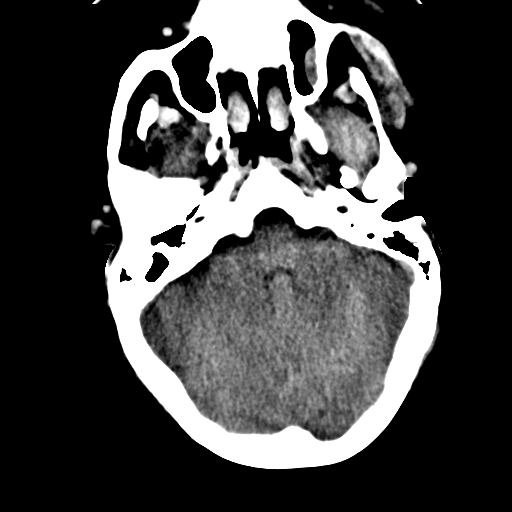
[im 10/34  brain]
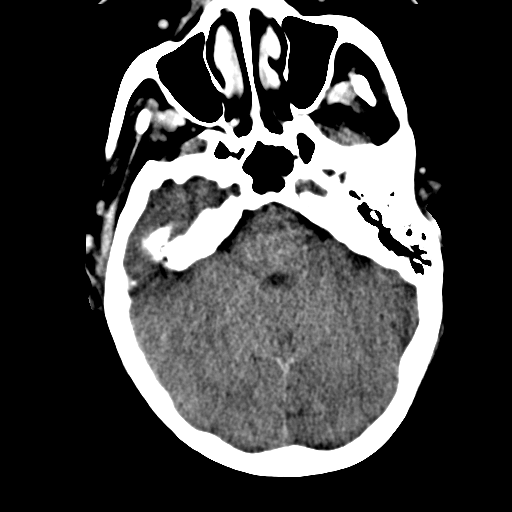
[im 10/34  bone]
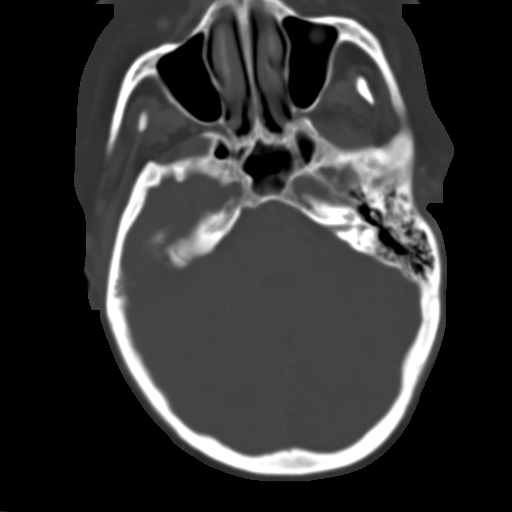
[im 12/34  brain]
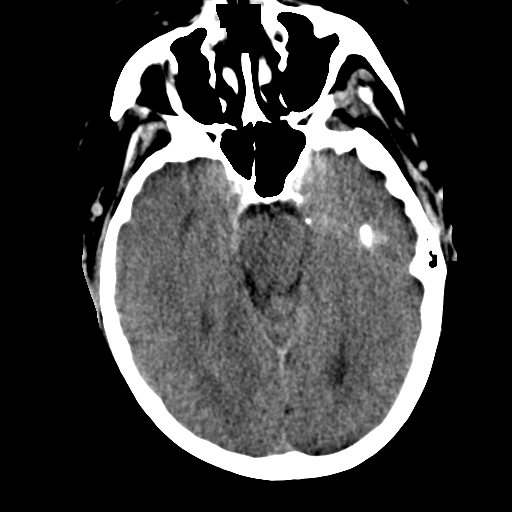
[im 14/34  brain]
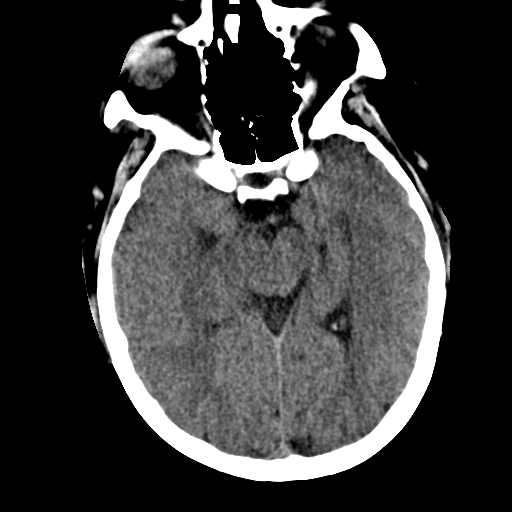
[im 16/34  brain]
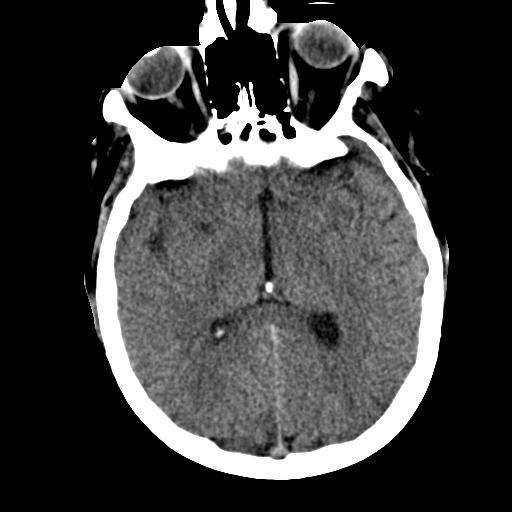
[im 18/34  brain]
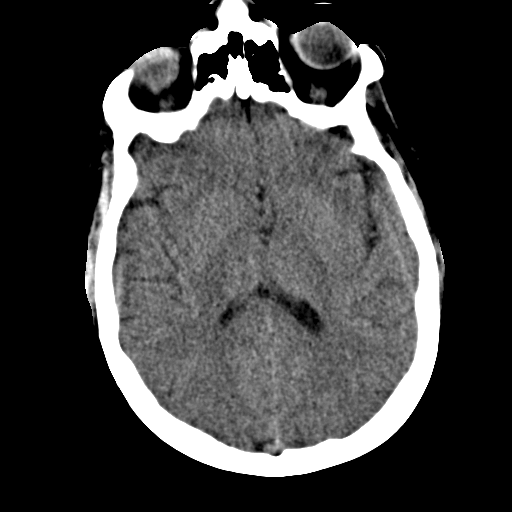
[im 18/34  bone]
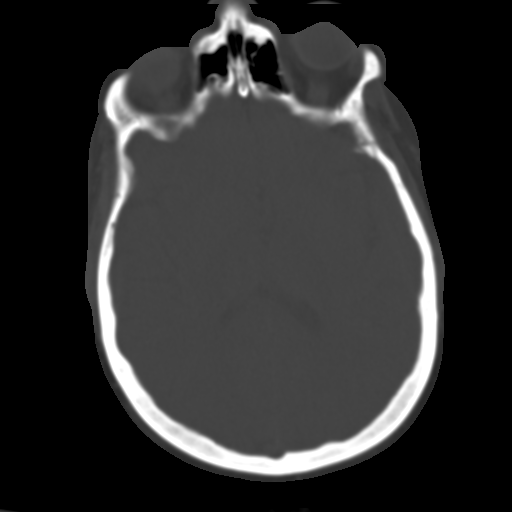
[im 20/34  brain]
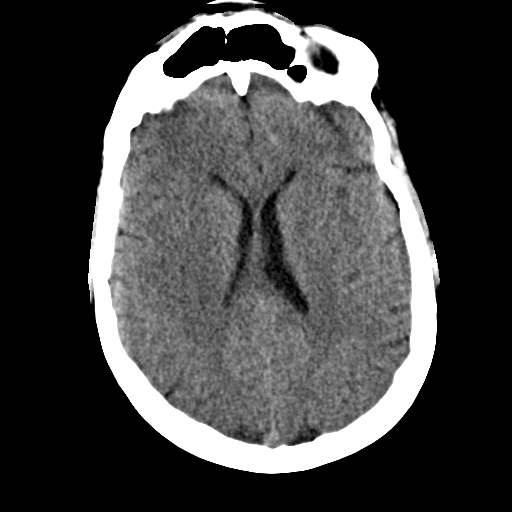
[im 22/34  brain]
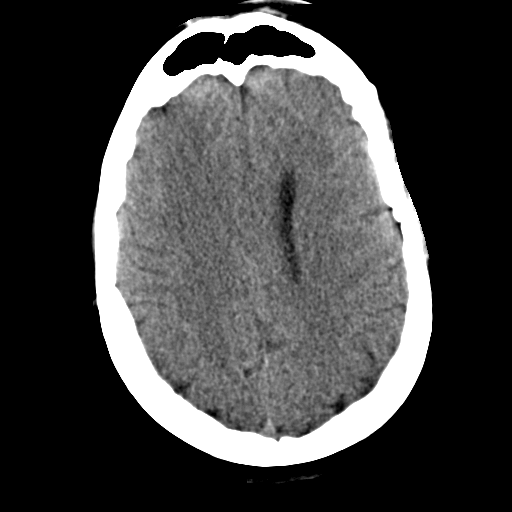
[im 24/34  brain]
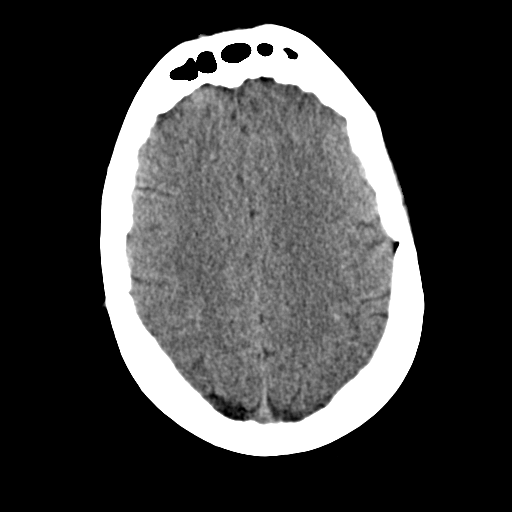
[im 26/34  brain]
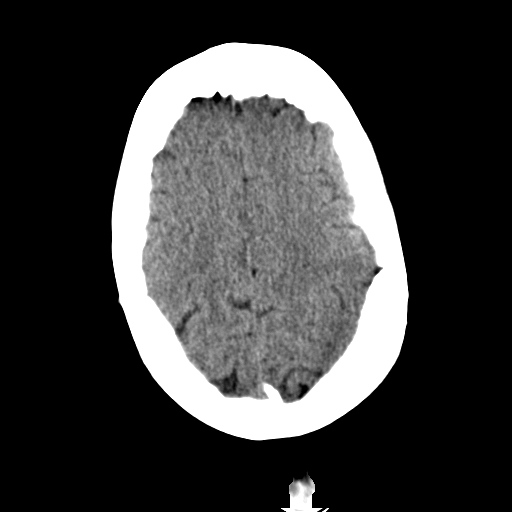
[im 26/34  bone]
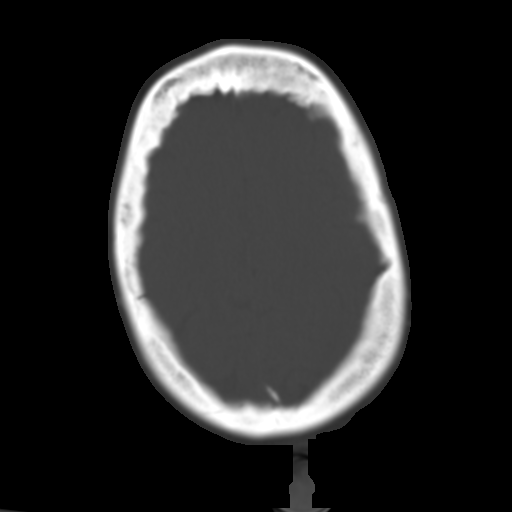
[im 28/34  brain]
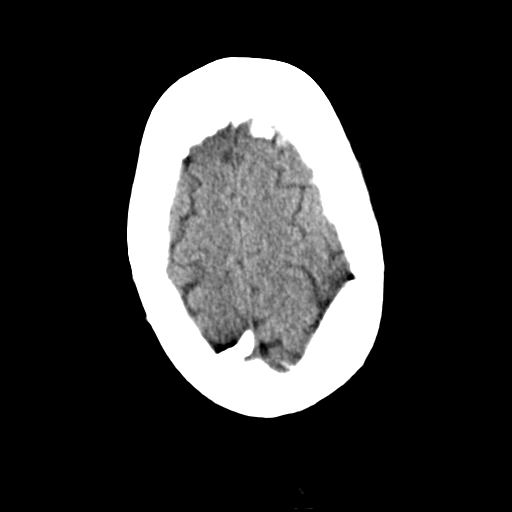
[im 30/34  brain]
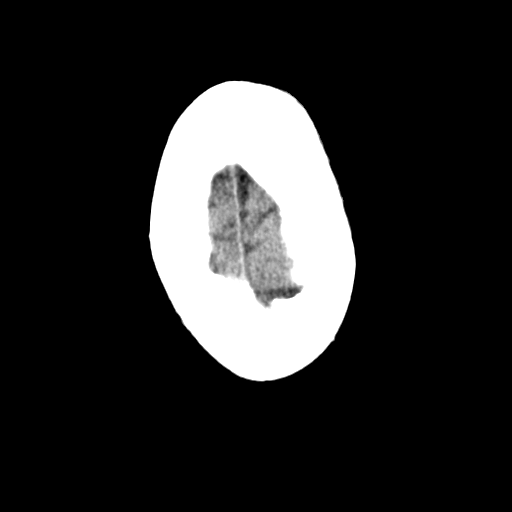
[im 32/34  brain]
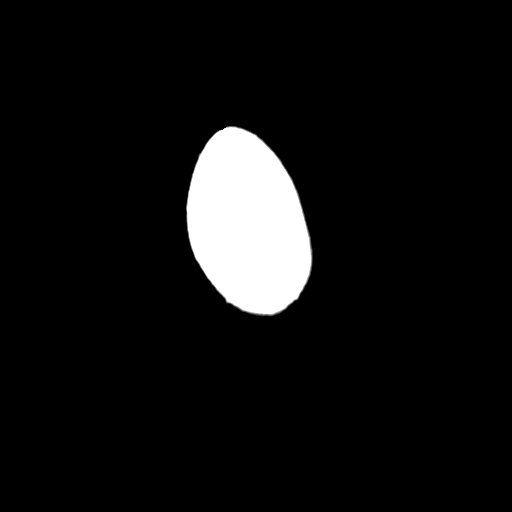

[16 of 30 positions shown; findings below may reference images not displayed]

PROCEDURE:     CT  - CT HEAD WITHOUT CONTRAST  - [DATE]  [DATE]

RESULT:     Noncontrast emergent CT of the brain is performed. The patient
has no previous exam for comparison.

The ventricles and sulci are normal. There is no hemorrhage. There is no
focal mass, mass-effect or midline shift. There is no evidence of edema or
territorial infarct. The bone windows demonstrate normal aeration of the
paranasal sinuses and mastoid air cells with small mucous retention cysts
seen in the maxillary sinuses bilaterally. There is no skull fracture
demonstrated.
IMPRESSION: 1. No acute intracranial abnormality.

## 2009-10-20 IMAGING — CR DG LUMBAR SPINE 2-3V
1 series · 3 of 3 positions shown · non-contrast
Comparison: none

REASON FOR EXAM: pain
COMMENTS:

[Series 1: view not recorded · 0.17mm/px · 3 of 3 slices shown]
[im 1/3]
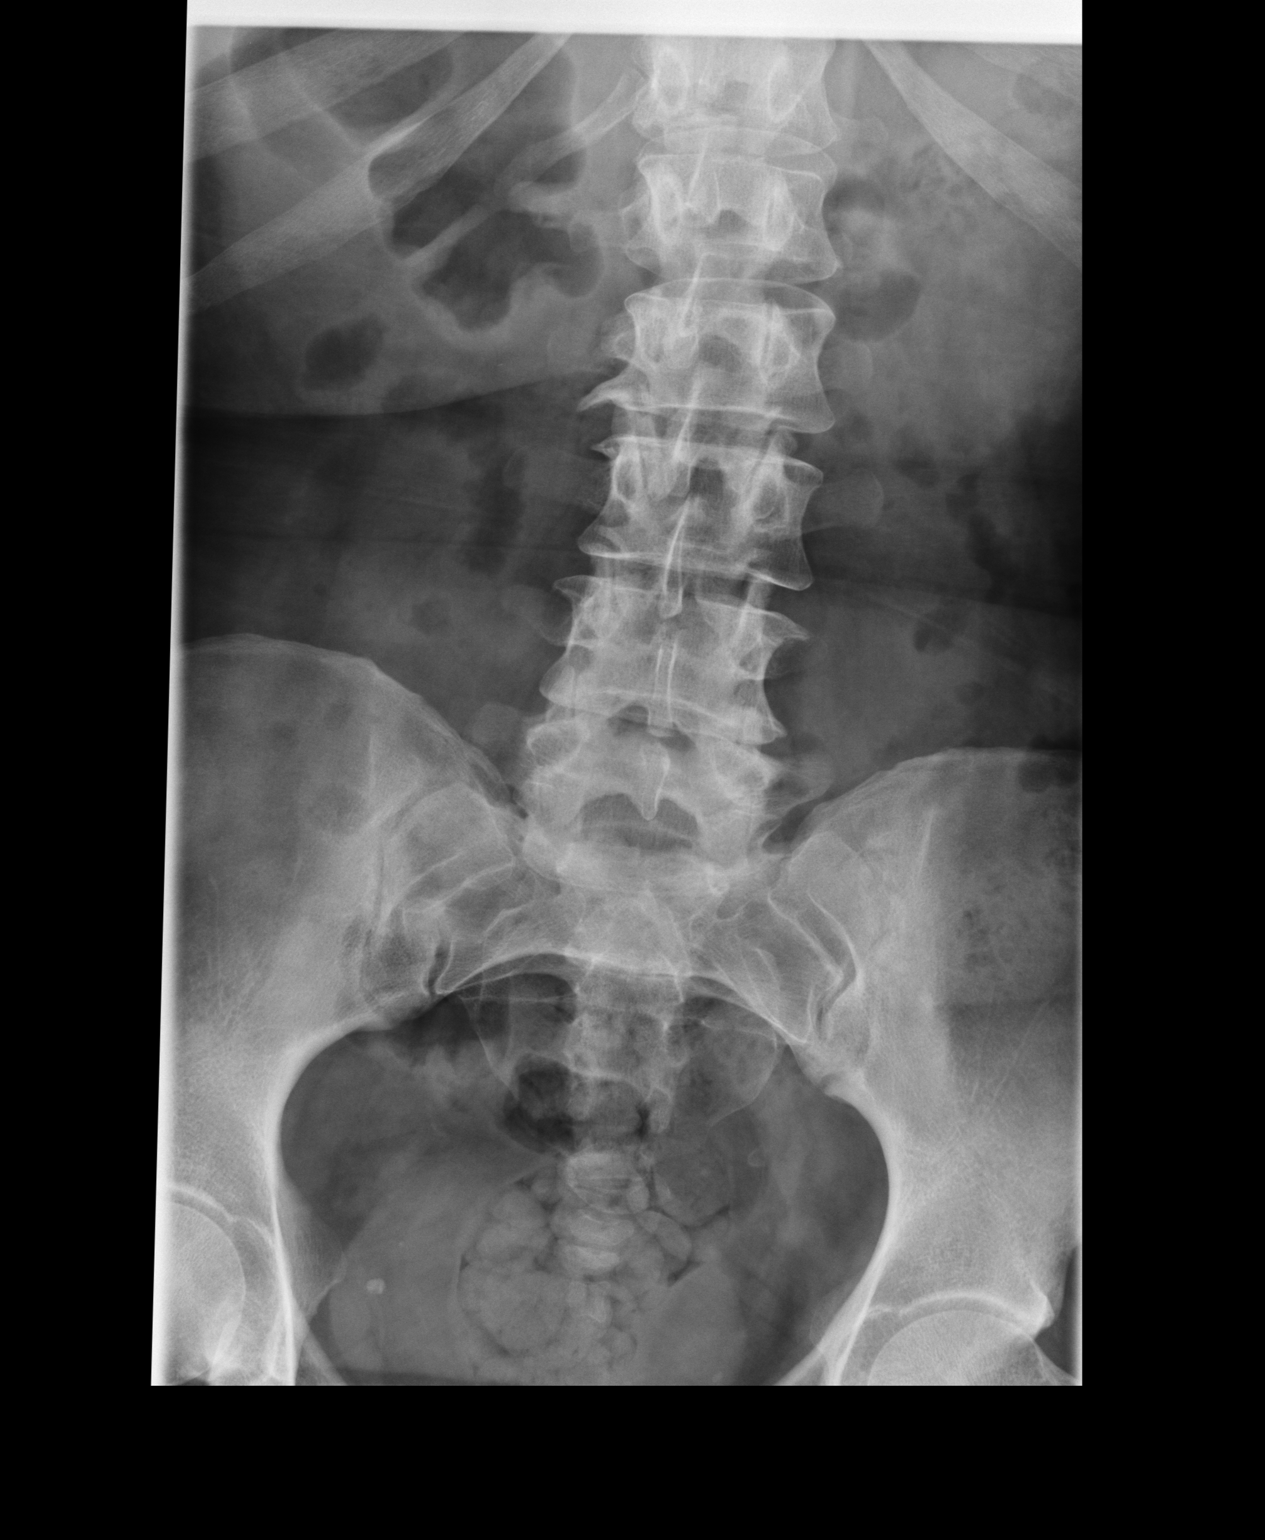
[im 2/3]
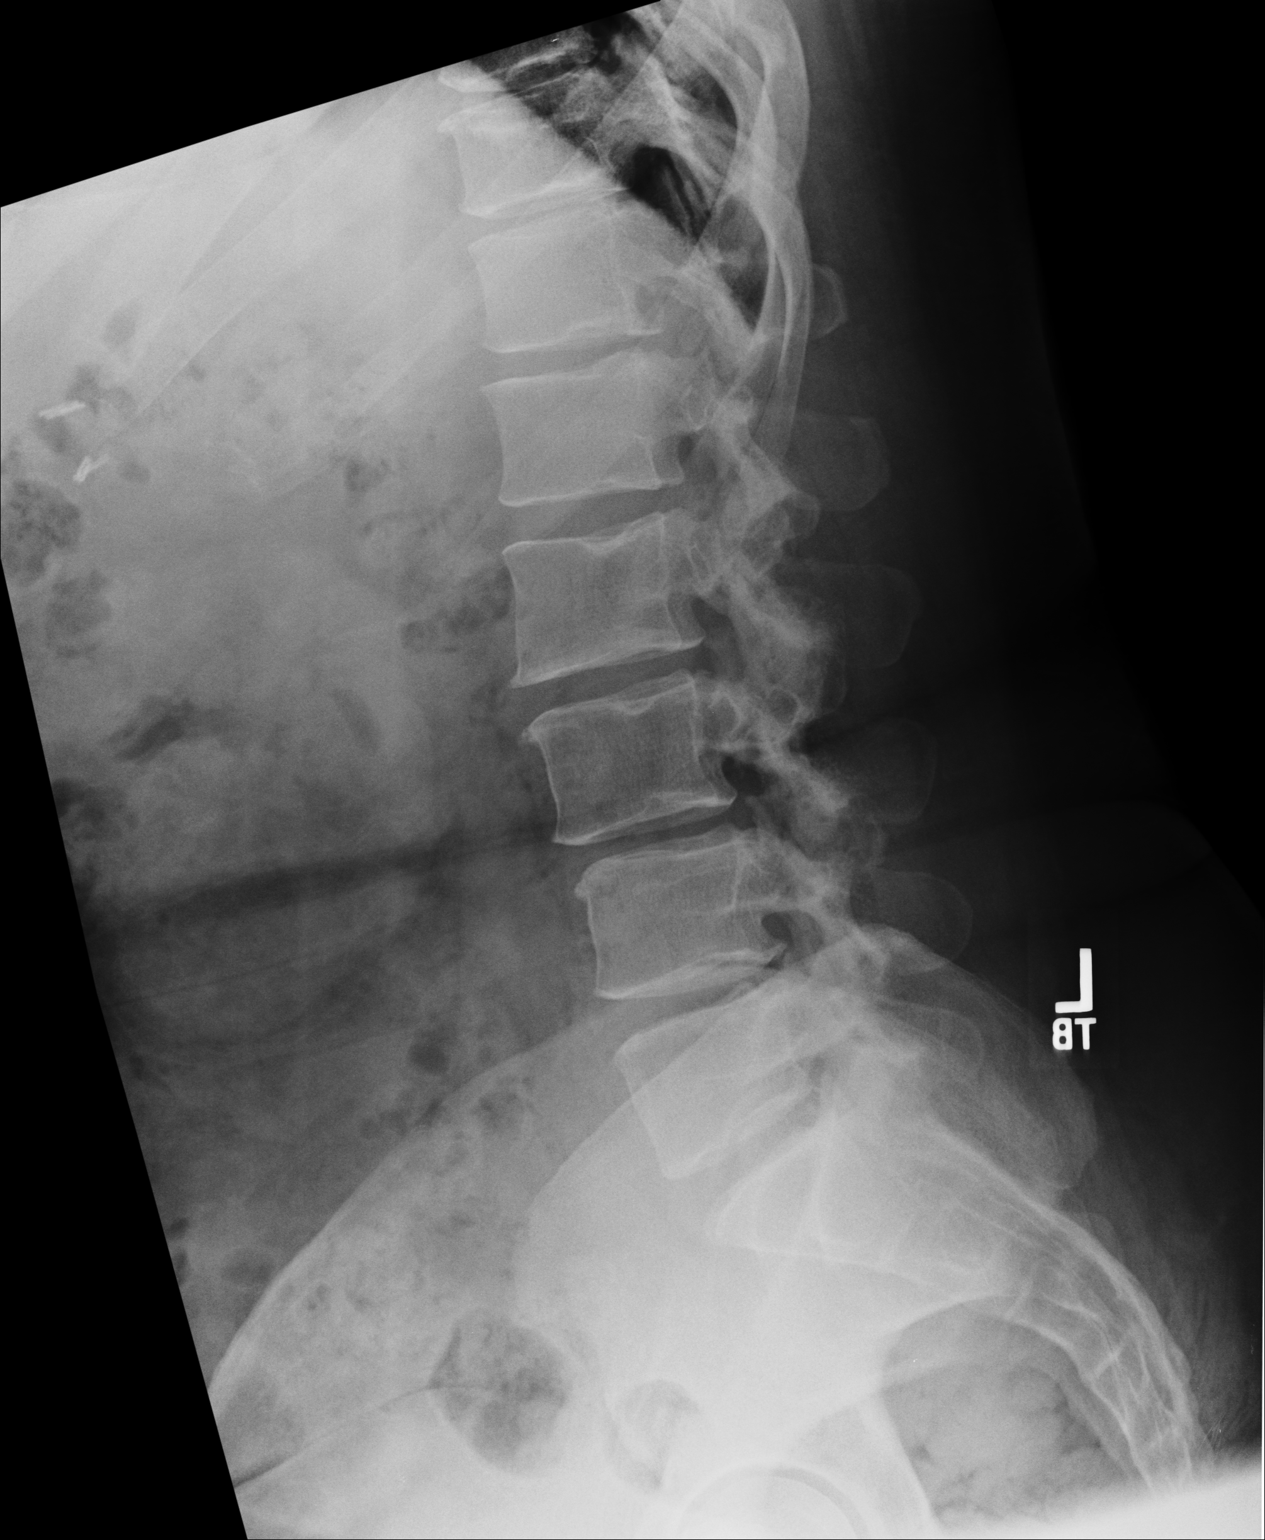
[im 3/3]
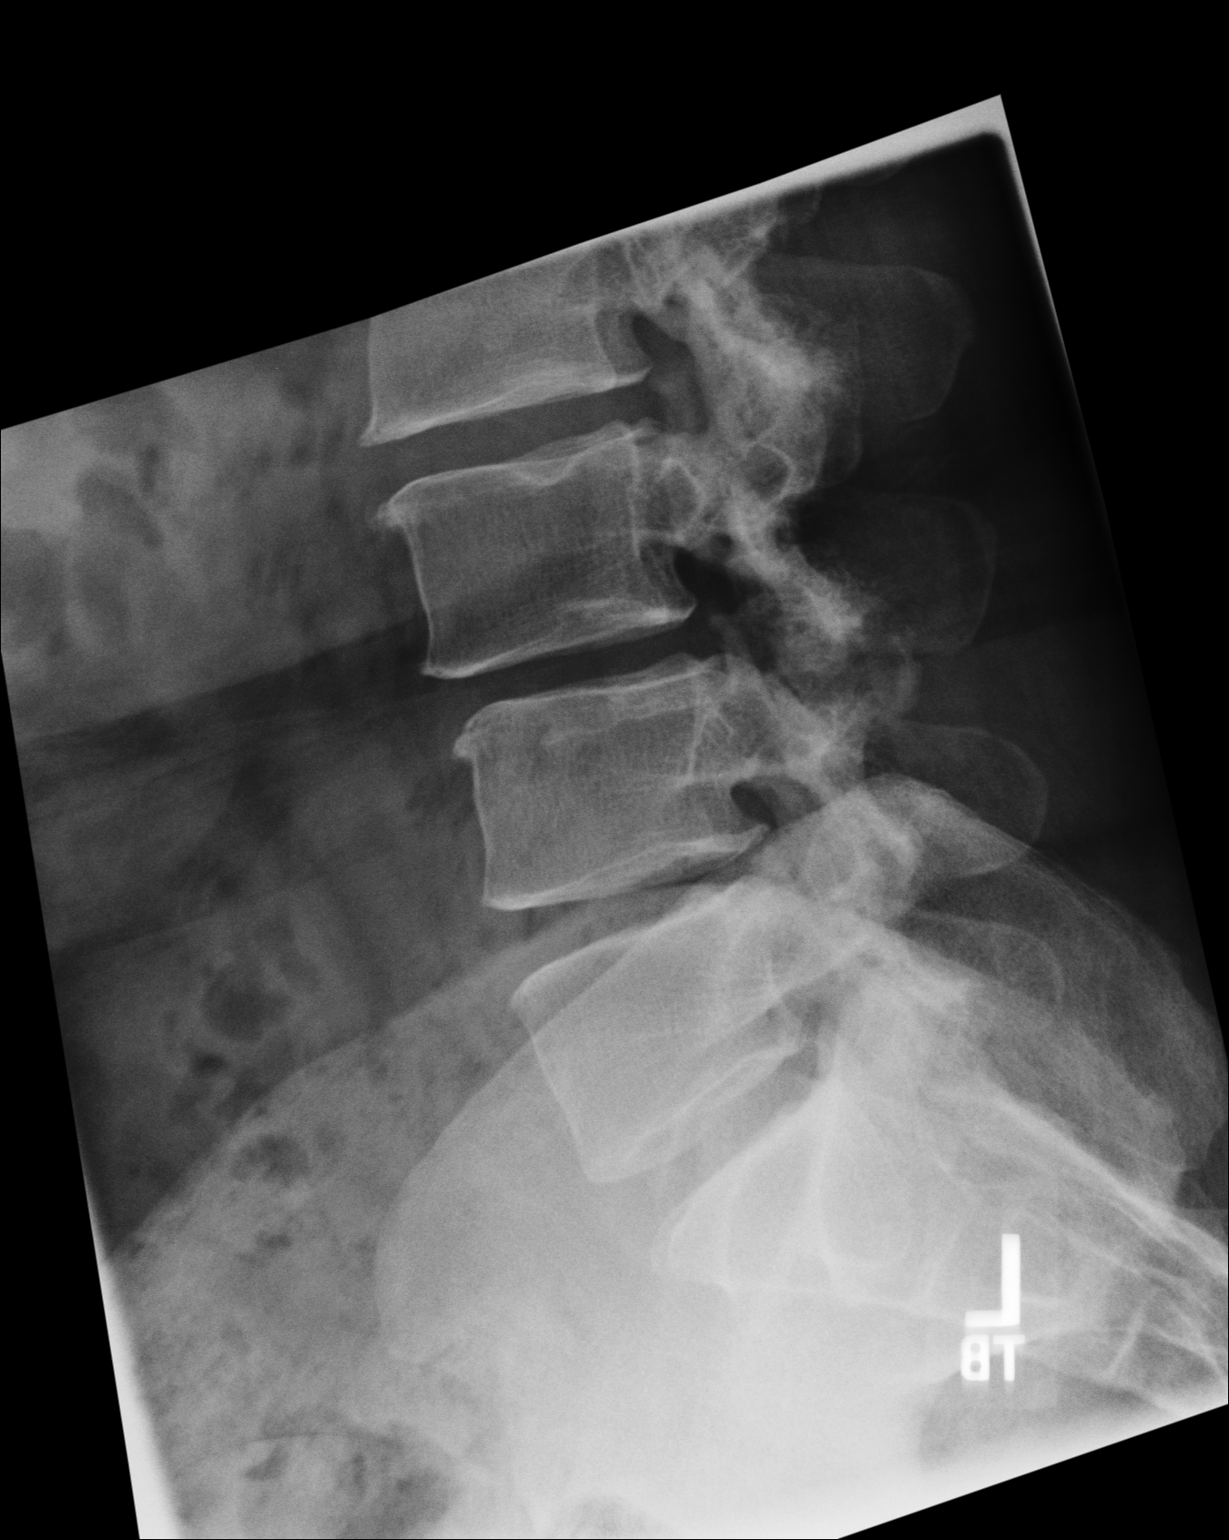

[3 of 3 positions shown; findings below may reference images not displayed]

PROCEDURE:     DXR - DXR LUMBAR SPINE AP AND LATERAL  - [DATE]  [DATE]

RESULT:     The lumbar vertebral bodies are preserved in height. Disc space
narrowing is noted at L3-L4, L4-L5, and minimally at L2-L3. I see no
spondylolisthesis nor evidence of a pars defect. There are small endplate
osteophytes at multiple levels. The pedicles appear intact. The visualized
portions of the transverse processes appear intact. The SI joints are normal
in appearance.
IMPRESSION: There are degenerative changes of the lumbar spine but I do
not see evidence of an acute fracture.

## 2009-10-20 IMAGING — CT CT CERVICAL SPINE WITHOUT CONTRAST
1 series · 12 of 14 positions shown, 15 images · non-contrast
Comparison: none

REASON FOR EXAM: fall neck pain
COMMENTS:

[Series 7: axial · axial · 0.26mm/px · z∈[+13,+165]mm · 12 of 90 slices shown, 15 images]
[im 7/90  soft-tissue]
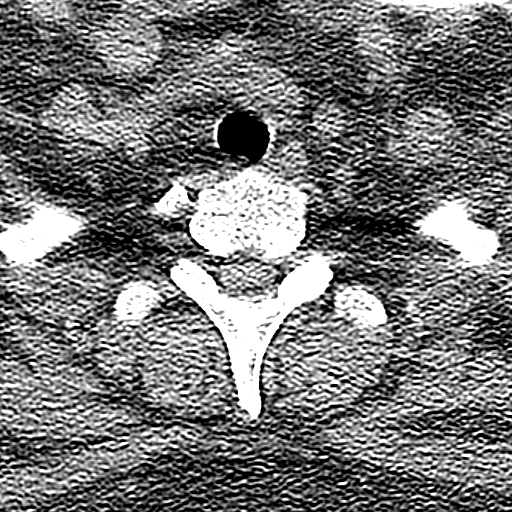
[im 7/90  bone]
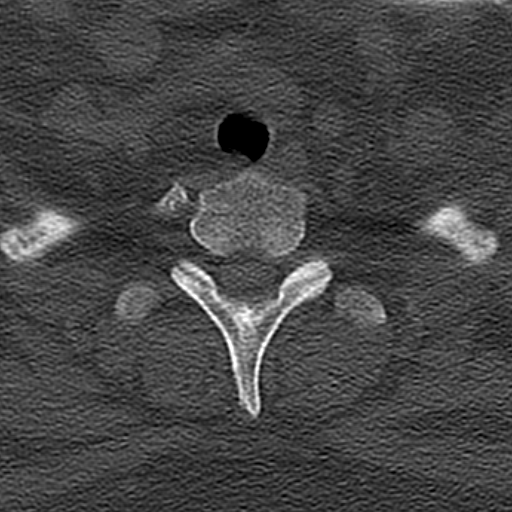
[im 14/90  bone]
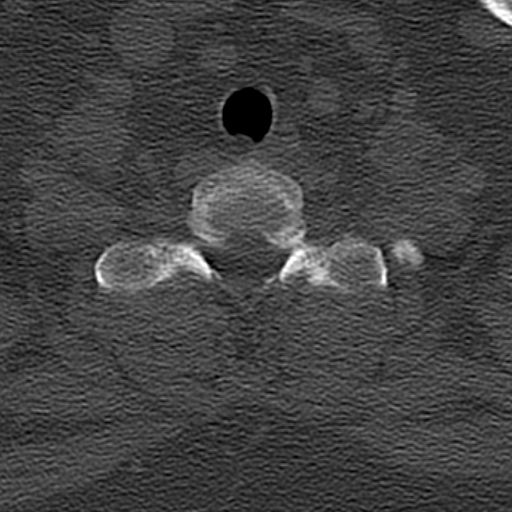
[im 21/90  bone]
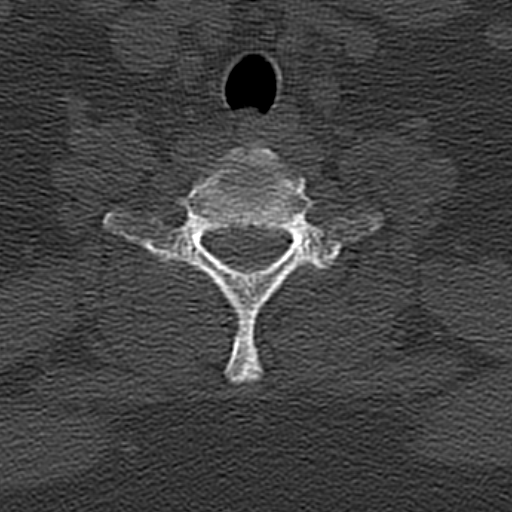
[im 28/90  bone]
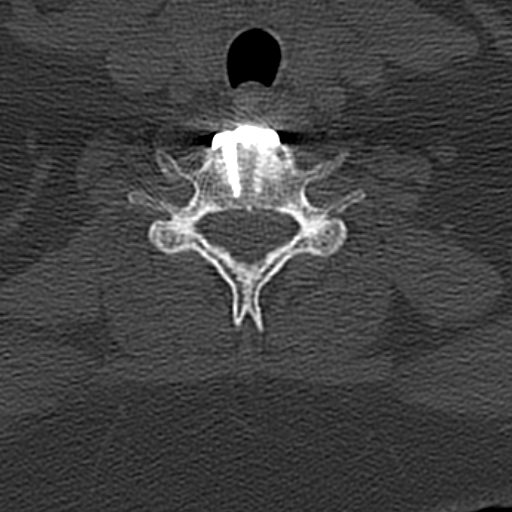
[im 35/90  soft-tissue]
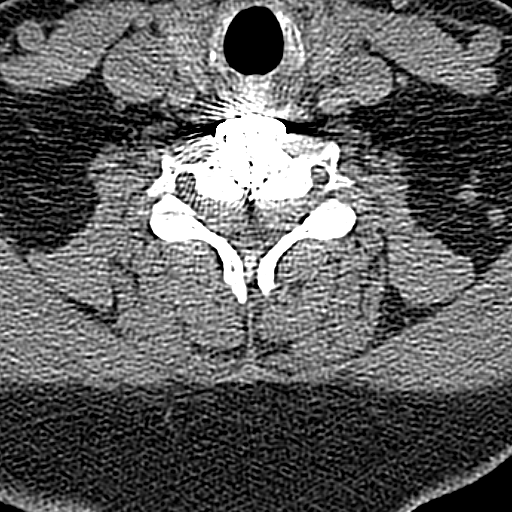
[im 35/90  bone]
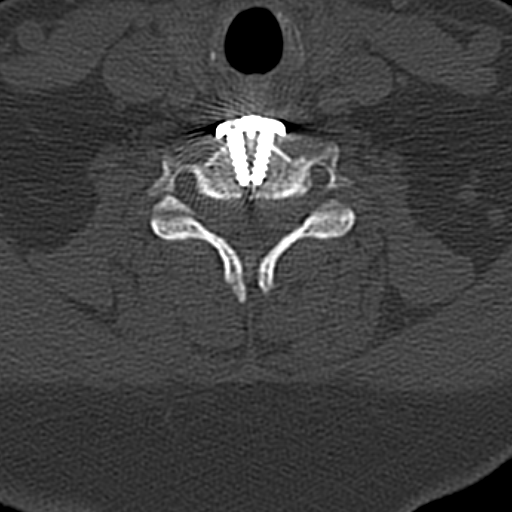
[im 42/90  bone]
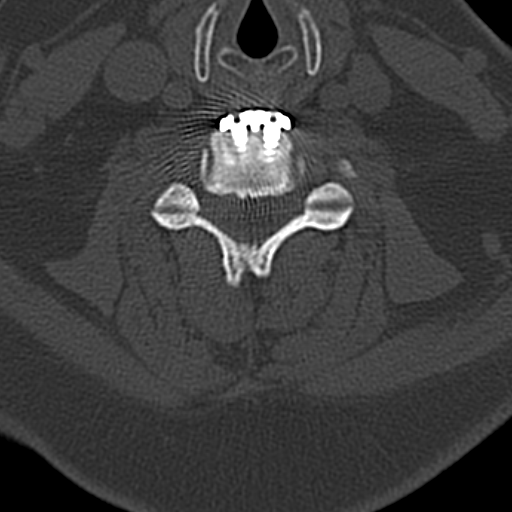
[im 48/90  bone]
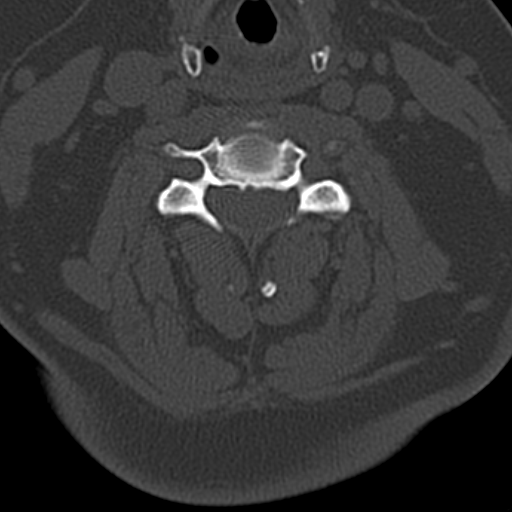
[im 55/90  bone]
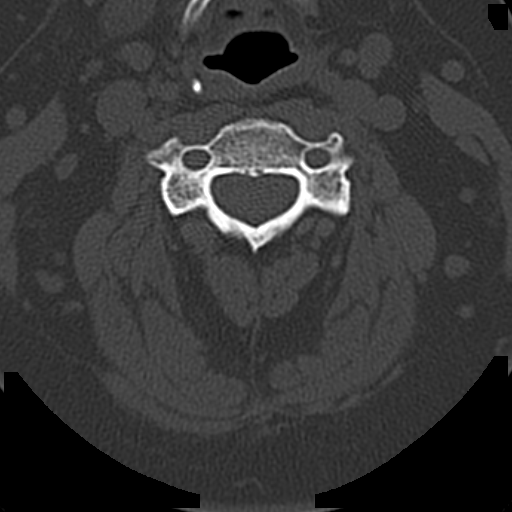
[im 62/90  soft-tissue]
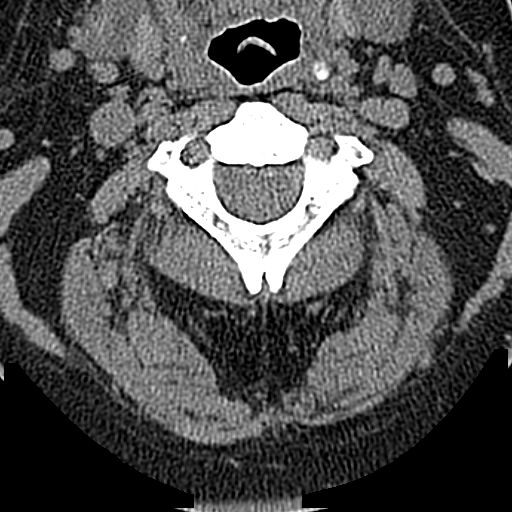
[im 62/90  bone]
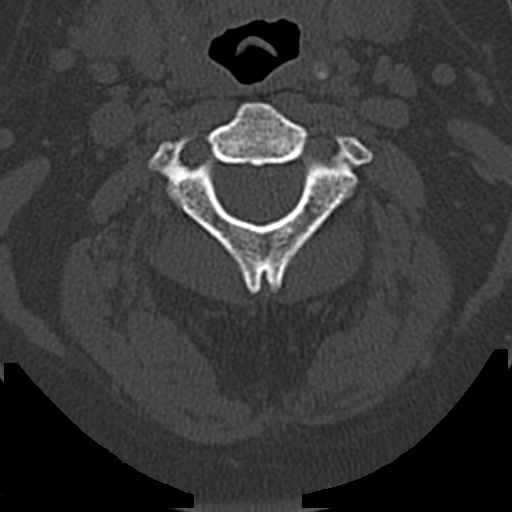
[im 69/90  bone]
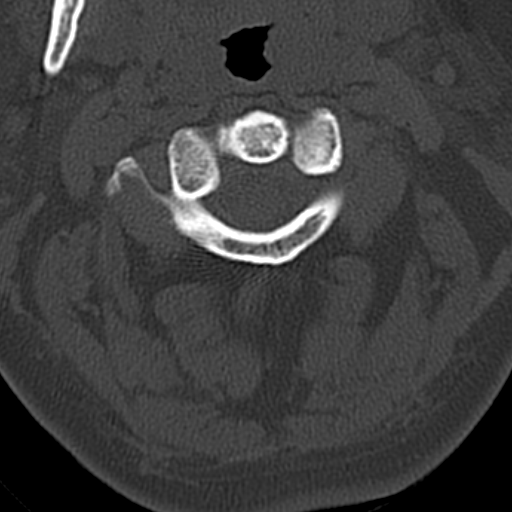
[im 76/90  bone]
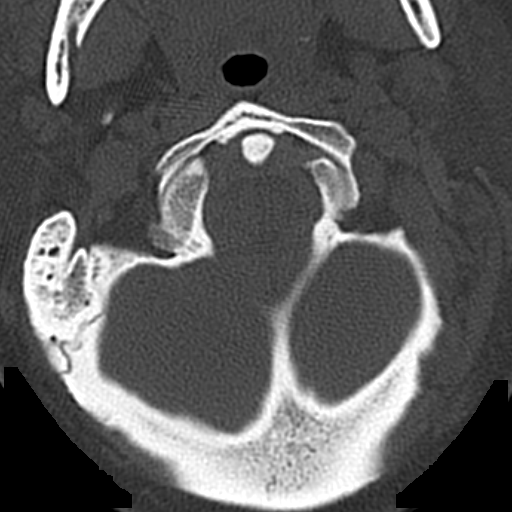
[im 83/90  bone]
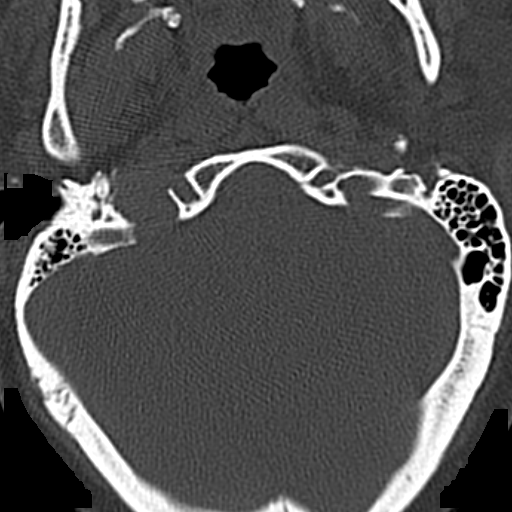

[12 of 14 positions shown; findings below may reference images not displayed]

PROCEDURE:     CT  - CT CERVICAL SPINE WO  - [DATE]  [DATE]

RESULT:     Multislice helical acquisition is performed in the standard
fashion. There is no previous exam for comparison. Axial, coronal and
sagittal bone window reconstructions were performed.

The craniocervical junction appears normal. The atlantoaxial alignment is
normal. The odontoid is intact. There are previous anterior cervical fusion
changes at C4-C5 and C5-C6 with intervertebral disc spacer is present. There
is some mild degenerative facet disease. No acute fracture is seen. The
hardware appears intact.
IMPRESSION: Postsurgical and mild degenerative changes are present.

## 2009-10-20 IMAGING — CR RIGHT FOREARM - 2 VIEW
1 series · 2 of 2 positions shown · non-contrast
Comparison: none

REASON FOR EXAM: pain
COMMENTS:

PROCEDURE:     DXR - DXR FOREARM RIGHT  - [DATE]  [DATE]
RESULT:     AP and lateral views of the right radius and ulna reveal the
bones to be adequately mineralized. I do not see evidence of an acute
fracture nor dislocation. The overlying soft tissues exhibit no abnormal
densities.

[Series 1: view not recorded · 0.17mm/px · 2 of 2 slices shown]
[im 1/2]
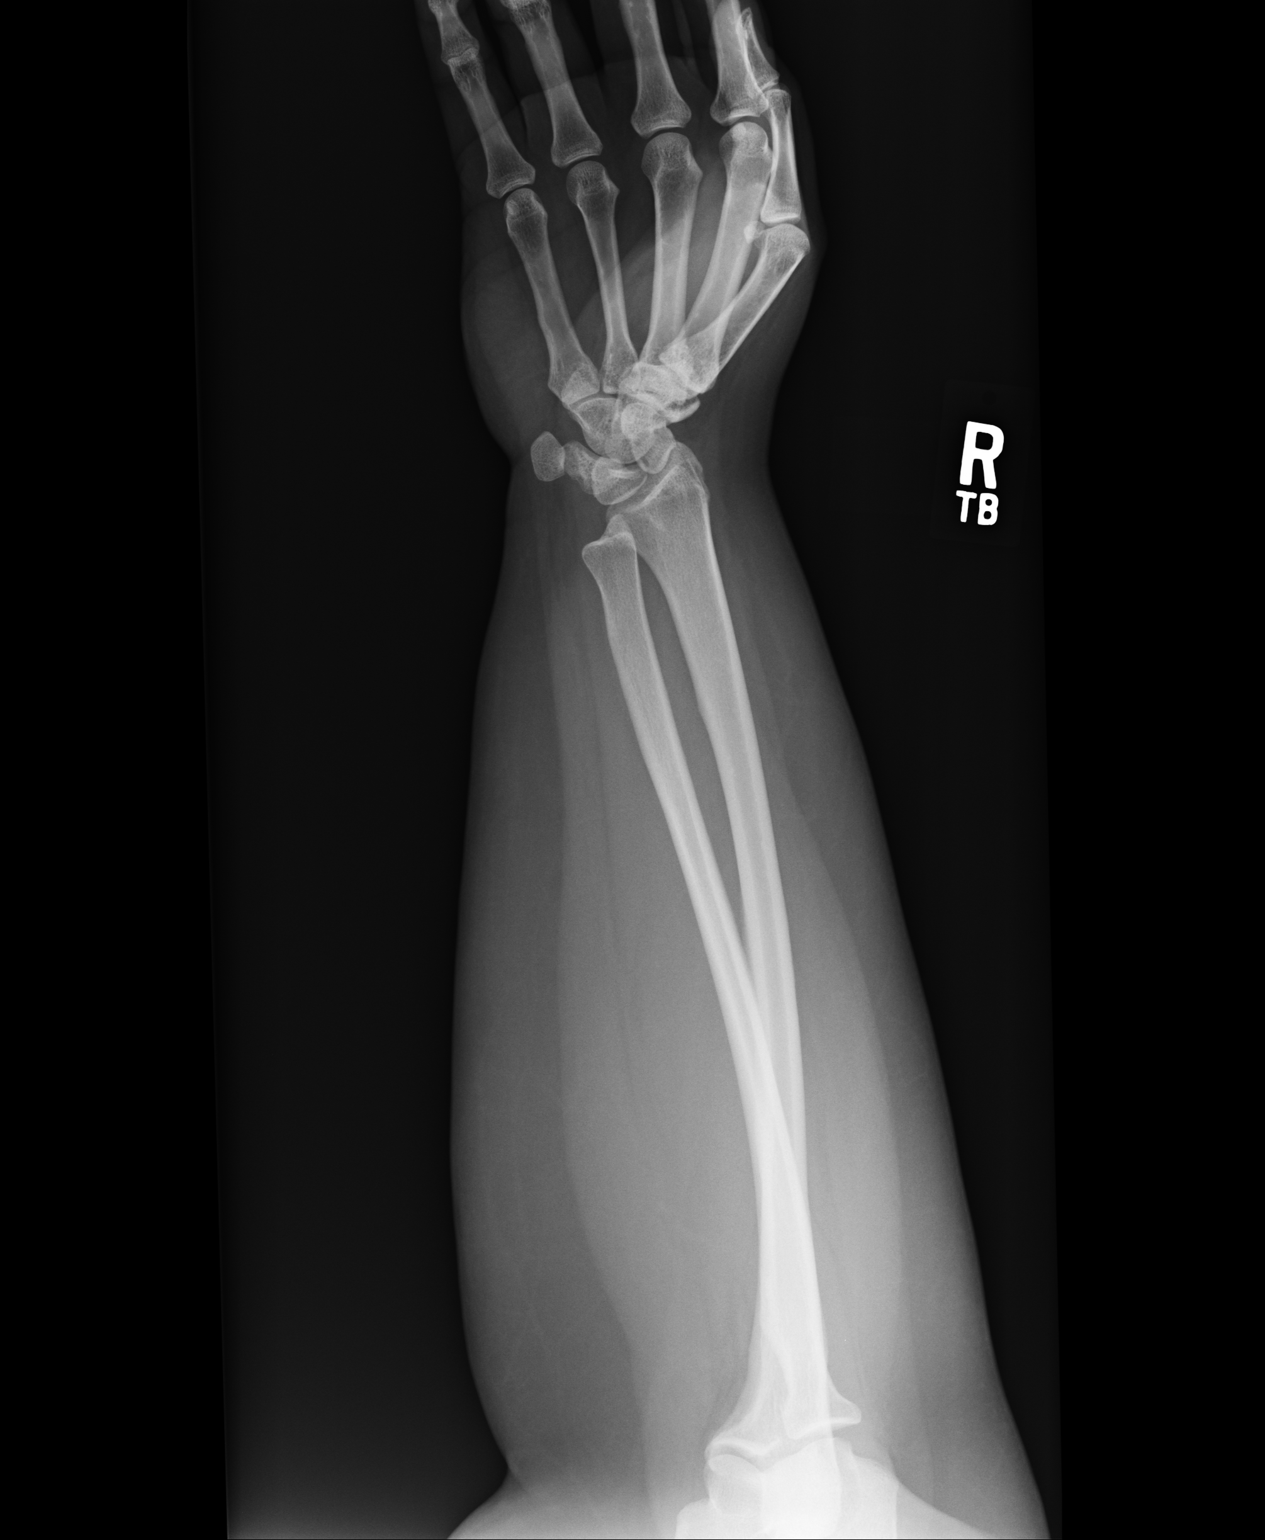
[im 2/2]
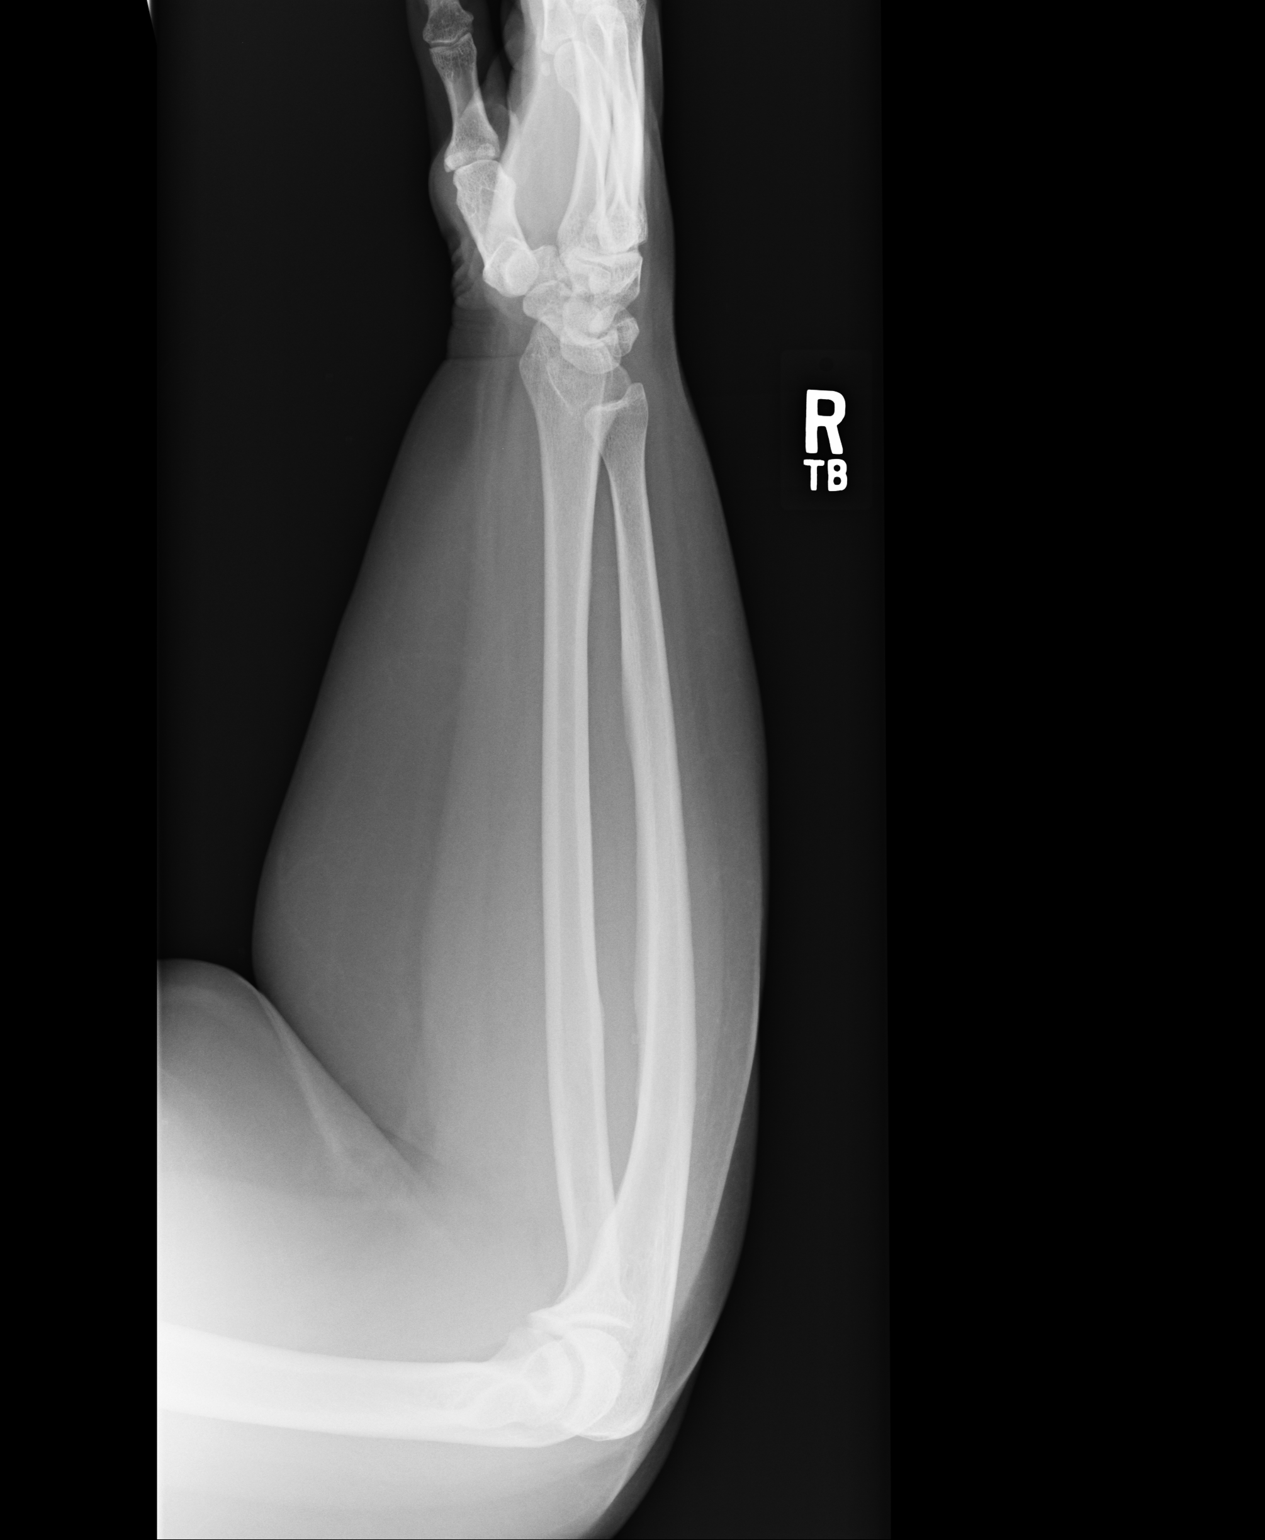

[2 of 2 positions shown; findings below may reference images not displayed]

IMPRESSION: I see no acute bony abnormality of the right radius or
ulna. If there are symptoms referable to the wrist or elbow, coned down
views of these areas are available upon request.

## 2010-04-29 ENCOUNTER — Emergency Department: Payer: Self-pay | Admitting: Emergency Medicine

## 2010-04-29 IMAGING — CT CT HEAD WITHOUT CONTRAST
2 series · 16 of 30 positions shown, 20 images · non-contrast
Comparison: none

REASON FOR EXAM: head pain s/p fall
COMMENTS:

PROCEDURE:     CT  - CT HEAD WITHOUT CONTRAST  - [DATE] [DATE]
RESULT:     Technique: Helical 5mm sections were obtained from the skull
base to the vertex without administration of intravenous contrast.

[Series 2: without · axial · non-contrast · 0.46mm/px · z∈[+336,+466]mm · 13 of 32 slices shown, 17 images]
[im 3/32  brain]
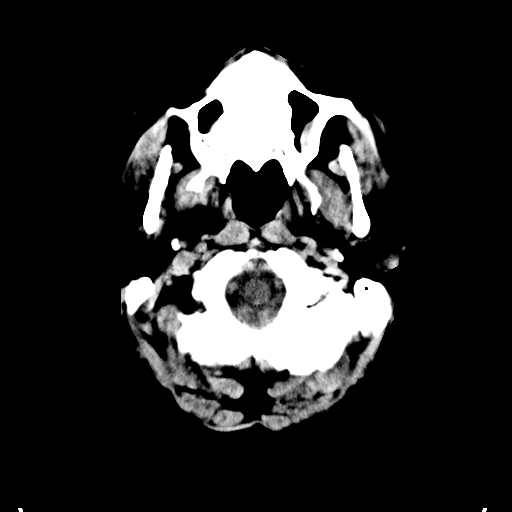
[im 3/32  bone]
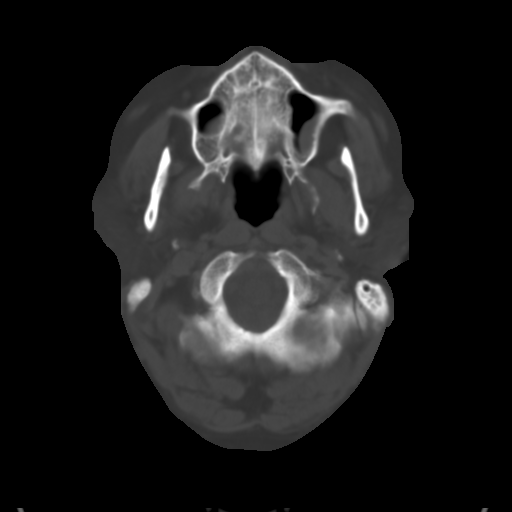
[im 5/32  brain]
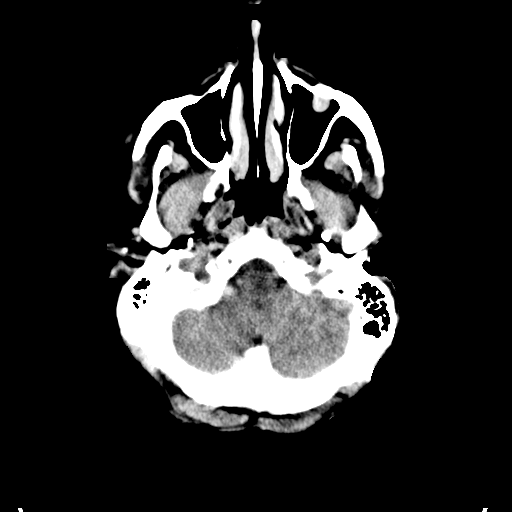
[im 7/32  brain]
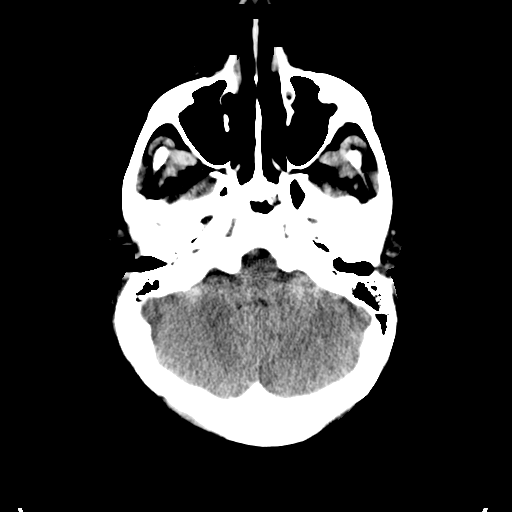
[im 9/32  brain]
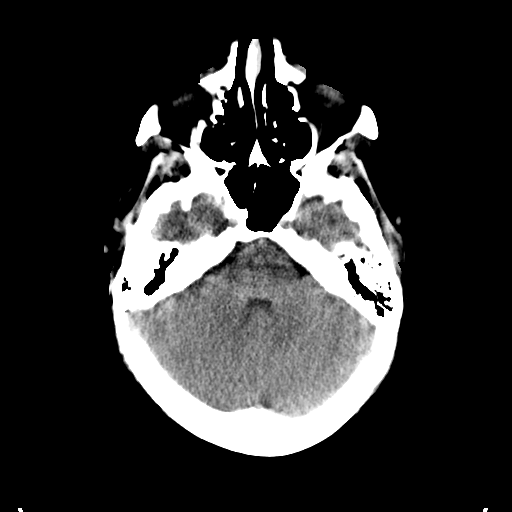
[im 12/32  brain]
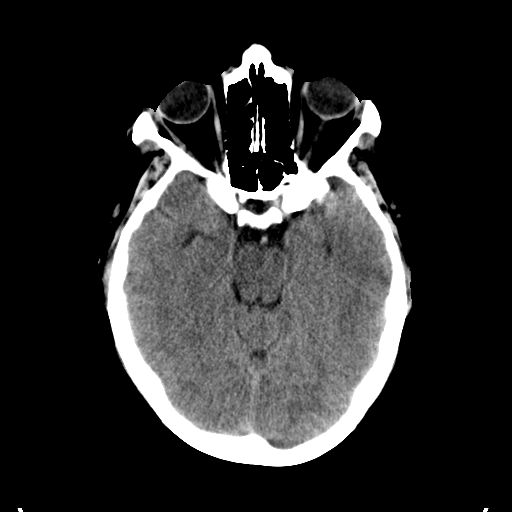
[im 12/32  bone]
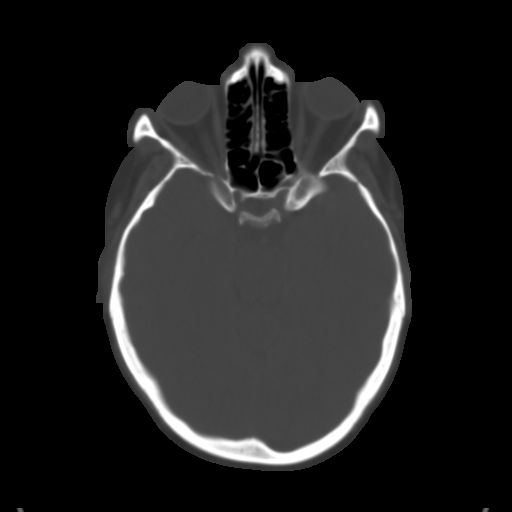
[im 14/32  brain]
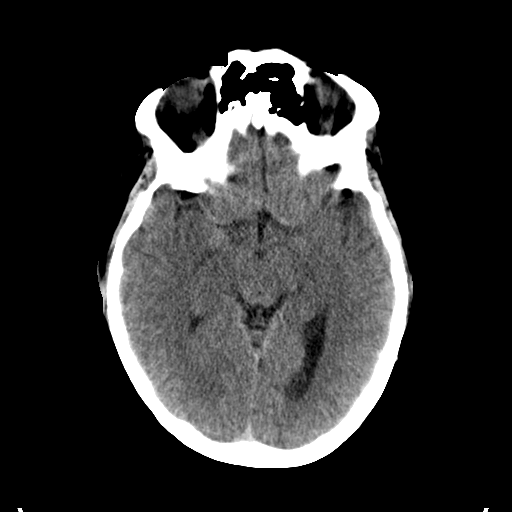
[im 16/32  brain]
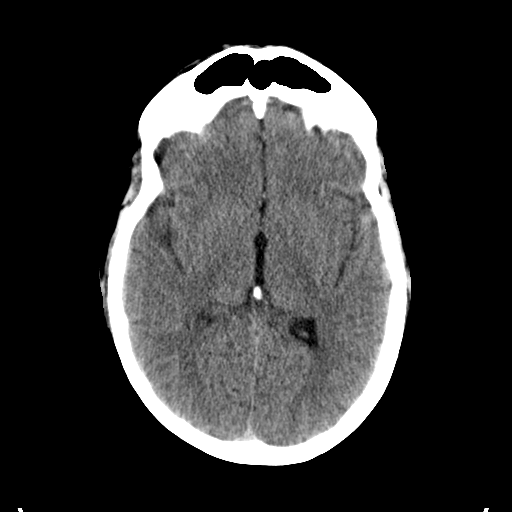
[im 18/32  brain]
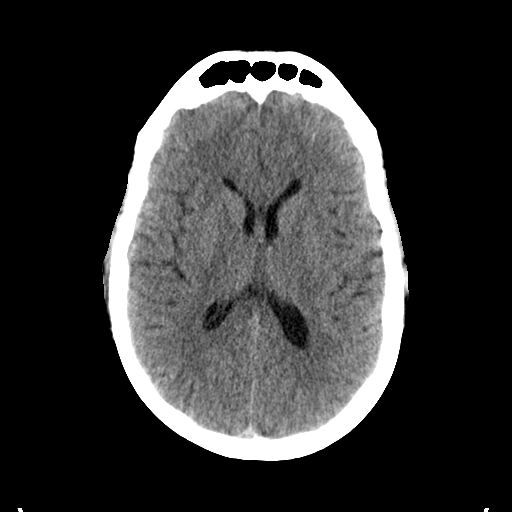
[im 20/32  brain]
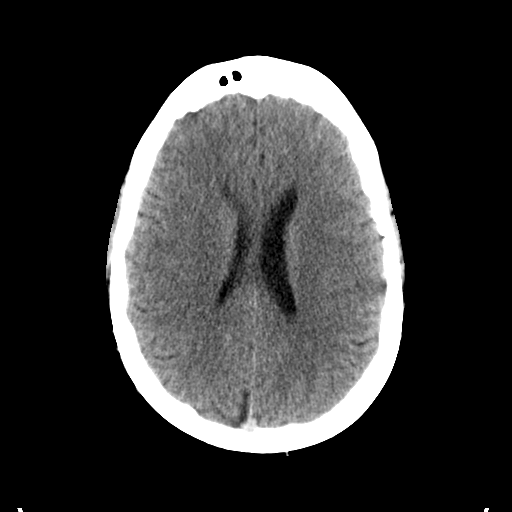
[im 20/32  bone]
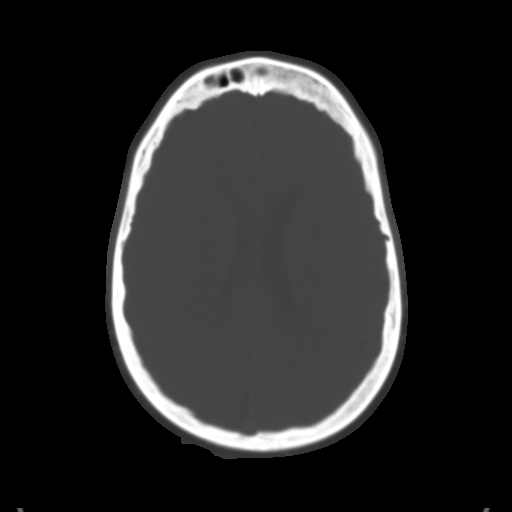
[im 23/32  brain]
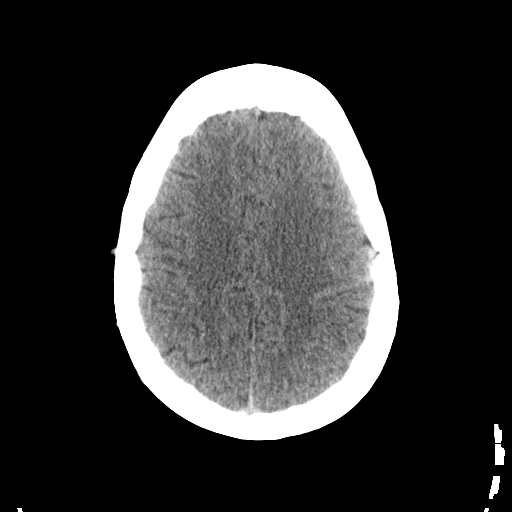
[im 25/32  brain]
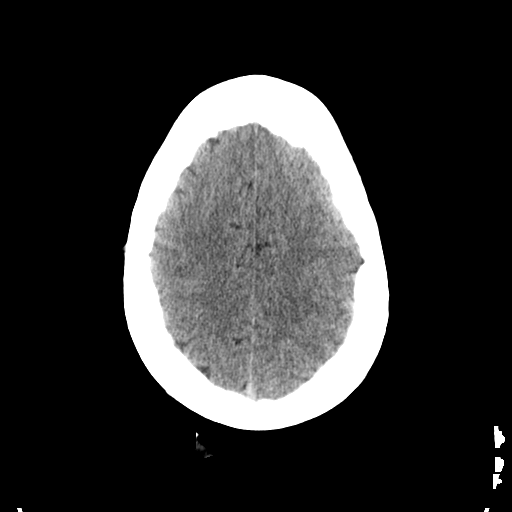
[im 27/32  brain]
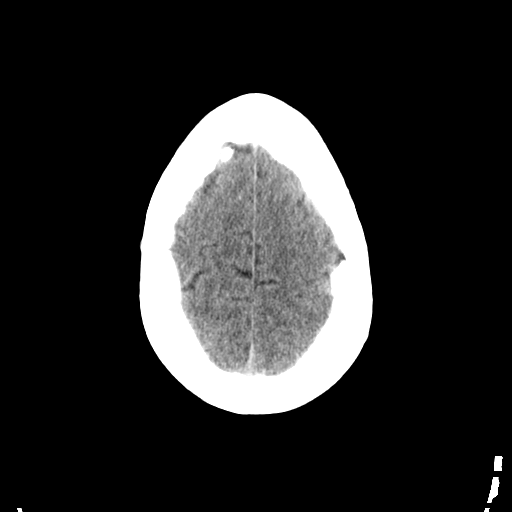
[im 29/32  brain]
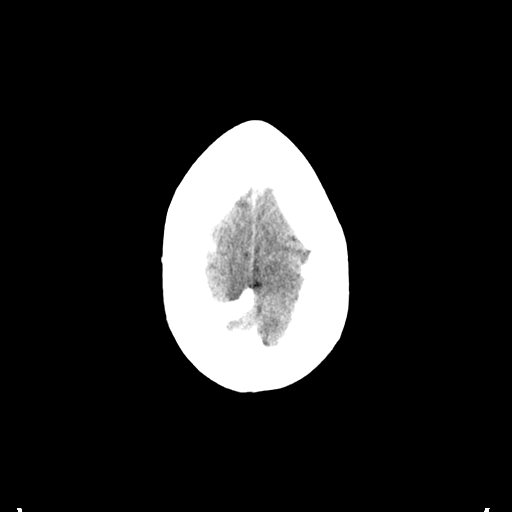
[im 29/32  bone]
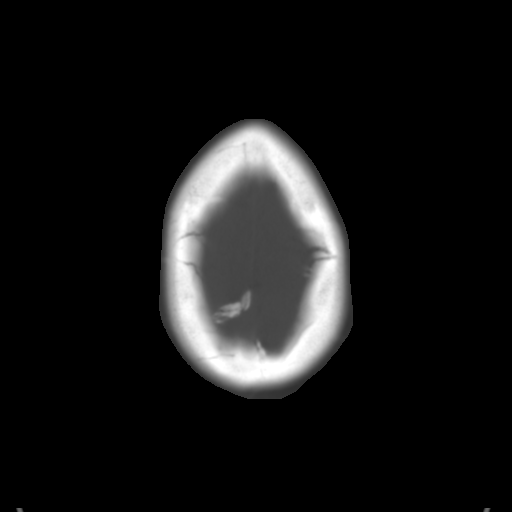

[Series 3: bone · axial · 0.46mm/px · z∈[+336,+381]mm · 3 of 32 slices shown]
[im 3/32  bone]
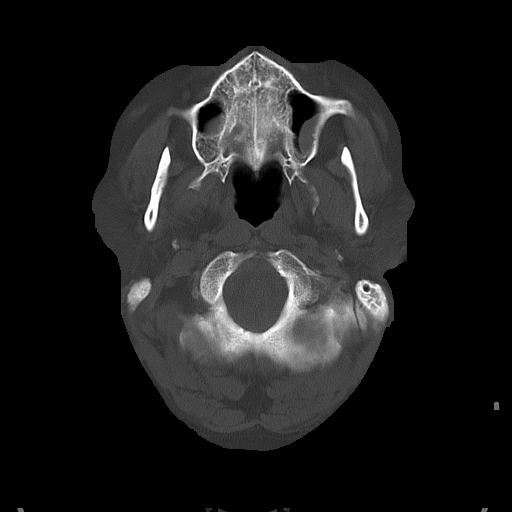
[im 7/32  bone]
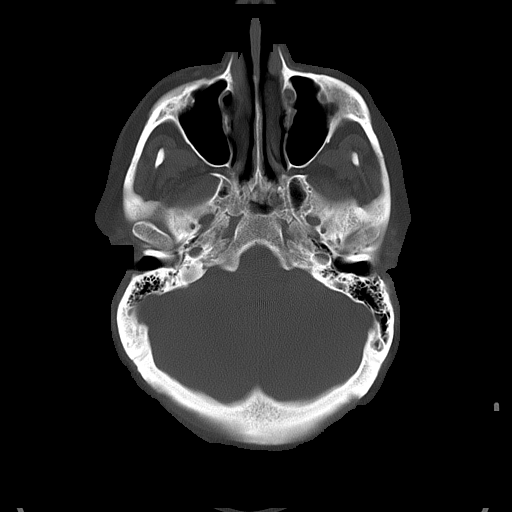
[im 12/32  bone]
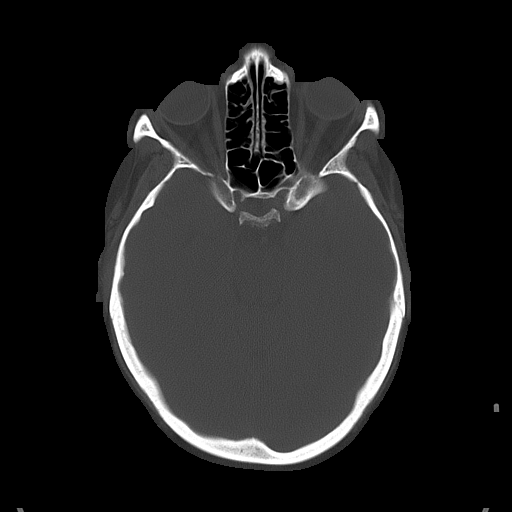

[16 of 30 positions shown; findings below may reference images not displayed]

FINDINGS: There is not evidence of intra-axial fluid collections. There is
no evidence of acute hemorrhage or secondary signs reflecting mass effect or
subacute or chronic focal territorial infarction. The osseous structures
demonstrate no evidence of a depressed skull fracture. If there is
persistent concern clinical follow-up with MRI is recommended.
IMPRESSION: 1. No evidence of acute intracranial abnormalitites.
2. Comparison made to prior study dated [DATE].
[DATE]. Dr. ALUN of the emergency department was informed of these findings
via a preliminary faxed report.

## 2010-04-29 IMAGING — CT CT CERVICAL SPINE WITHOUT CONTRAST
3 series · 16 of 33 positions shown, 19 images · non-contrast
Comparison: none

REASON FOR EXAM: neck pain s/p fall
COMMENTS:

PROCEDURE:     CT  - CT CERVICAL SPINE WO  - [DATE] [DATE]
RESULT:     Comparison is made to a prior study dated [DATE].
TECHNIQUE: Multiplanar imaging of the cervical spine was obtained utilizing
helical 2 mm acquisition and bone reconstruction algorithm.

[Series 3: axial · axial · 0.33mm/px · z∈[+183,+320]mm · 8 of 86 slices shown, 10 images]
[im 7/86  soft-tissue]
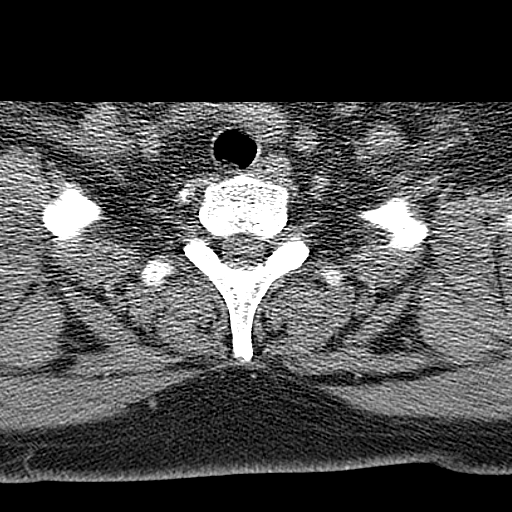
[im 7/86  bone]
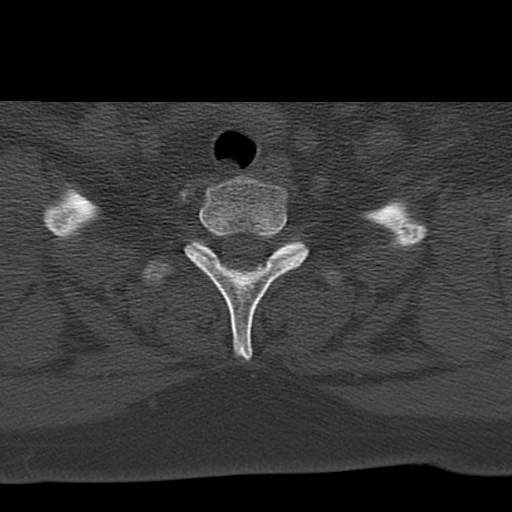
[im 20/86  bone]
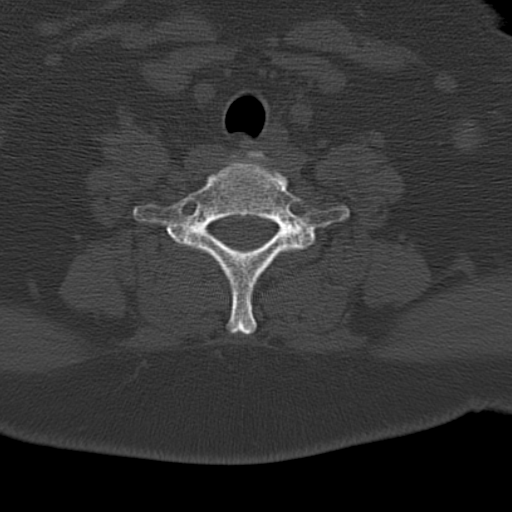
[im 27/86  bone]
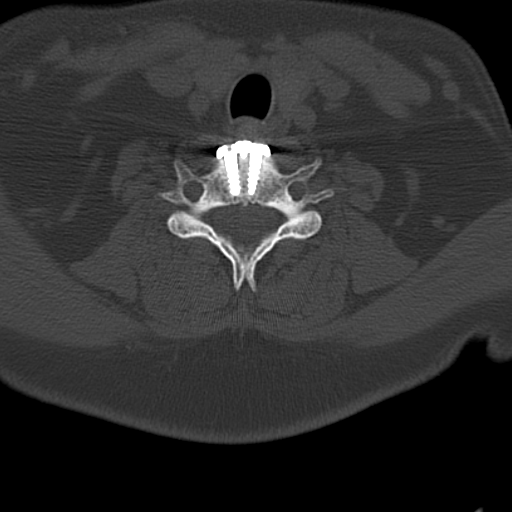
[im 40/86  bone]
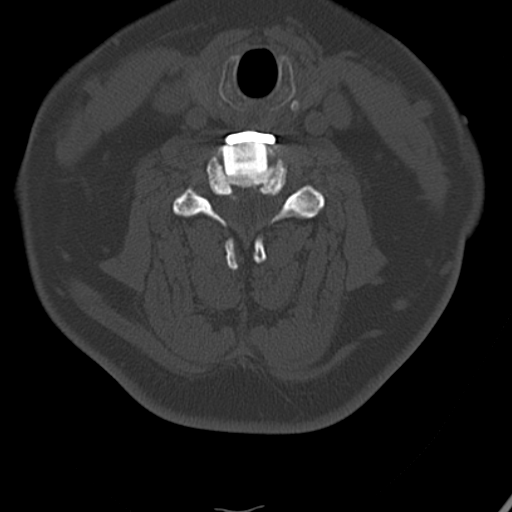
[im 46/86  soft-tissue]
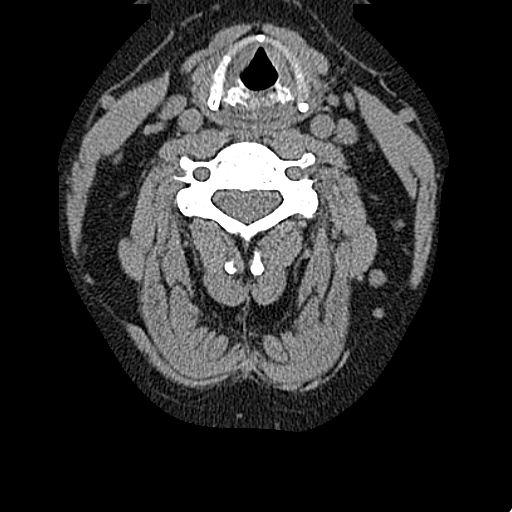
[im 46/86  bone]
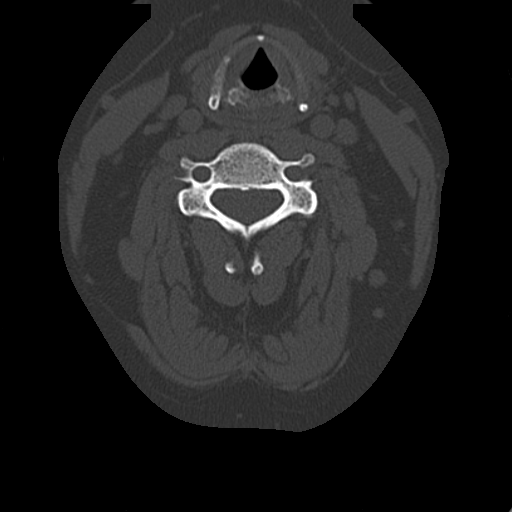
[im 59/86  bone]
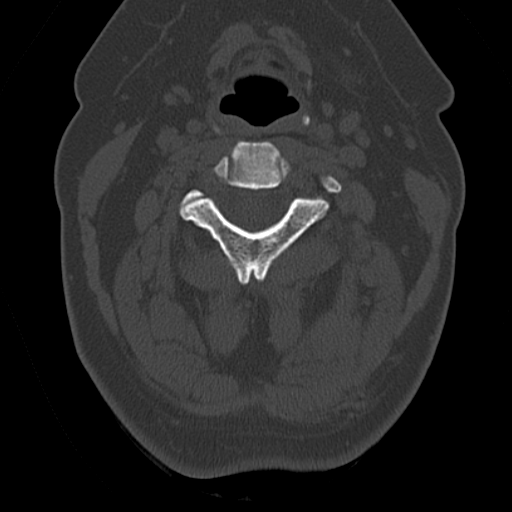
[im 66/86  bone]
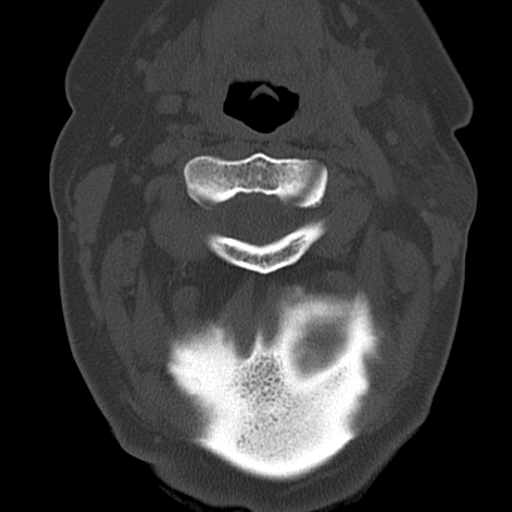
[im 79/86  bone]
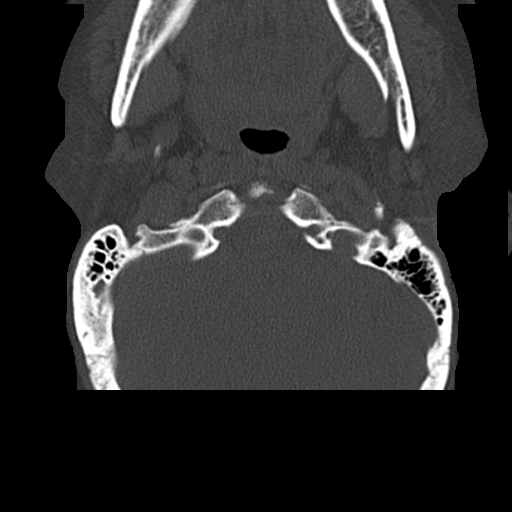

[Series 4: sagittal · sagittal · 0.40mm/px · 5 of 44 slices shown, 6 images]
[im 15/44  bone]
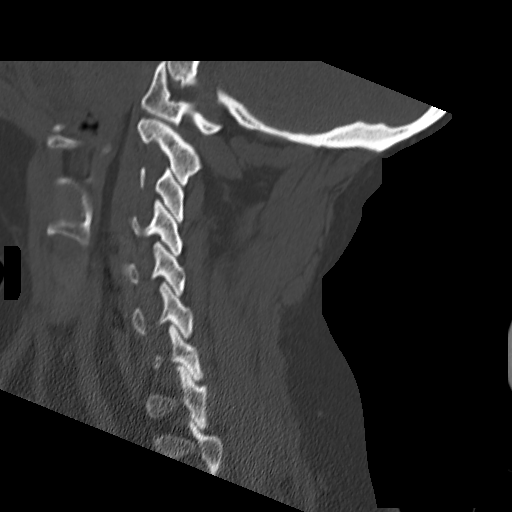
[im 18/44  bone]
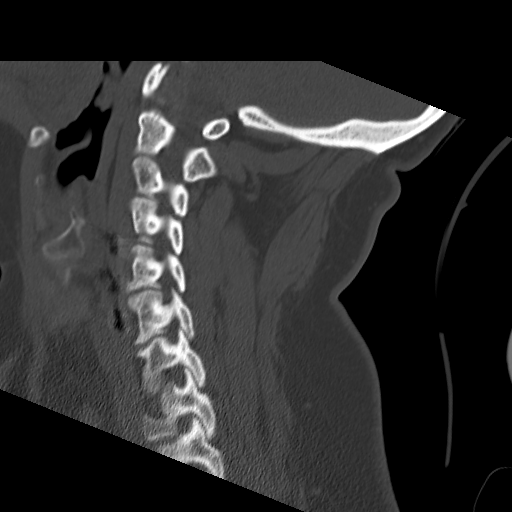
[im 22/44  soft-tissue]
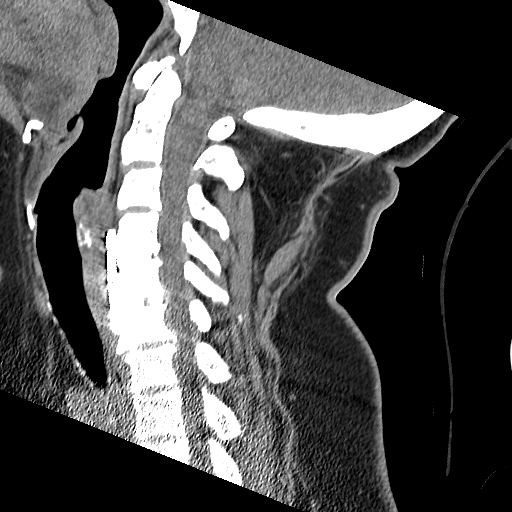
[im 22/44  bone]
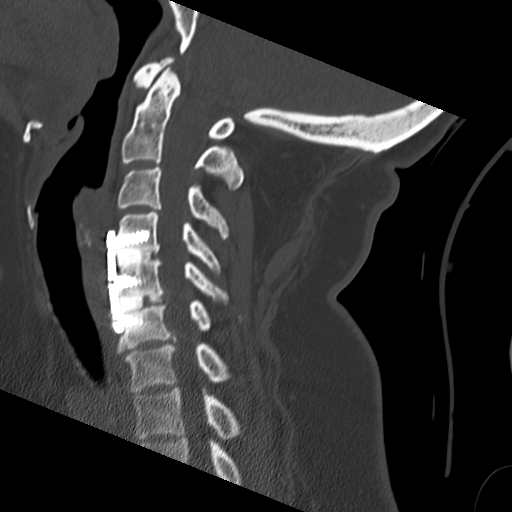
[im 26/44  bone]
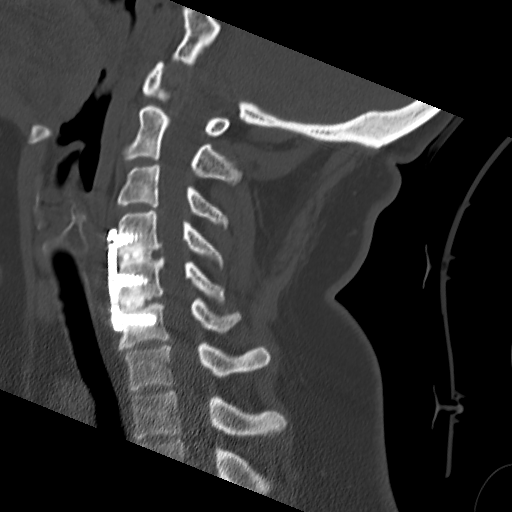
[im 29/44  bone]
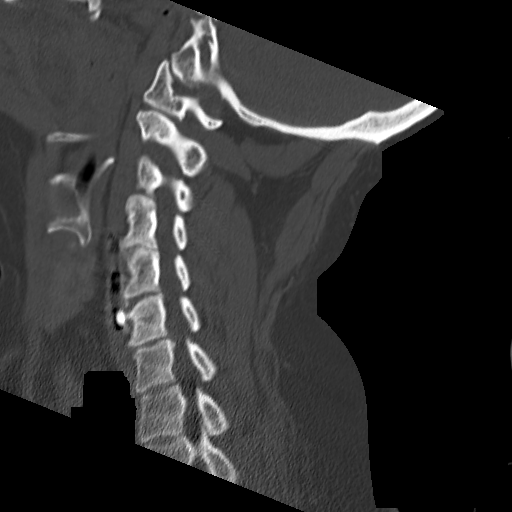

[Series 5: coronal · coronal · 0.40mm/px · 3 of 43 slices shown]
[im 9/43  bone]
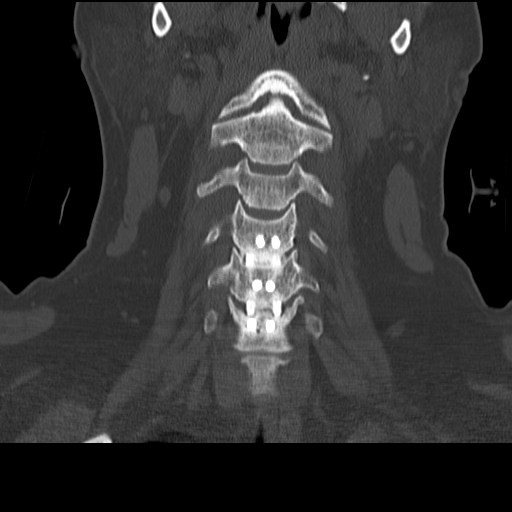
[im 17/43  bone]
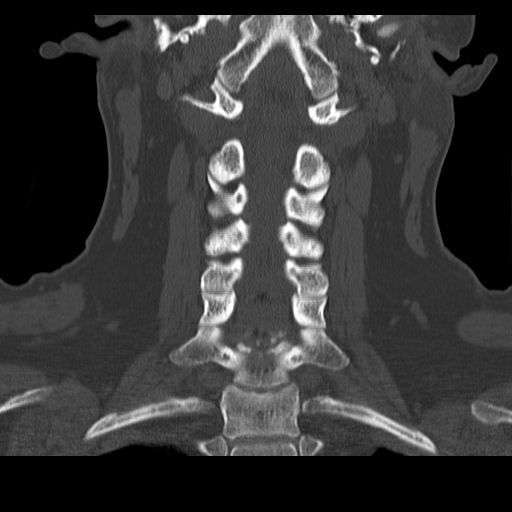
[im 26/43  bone]
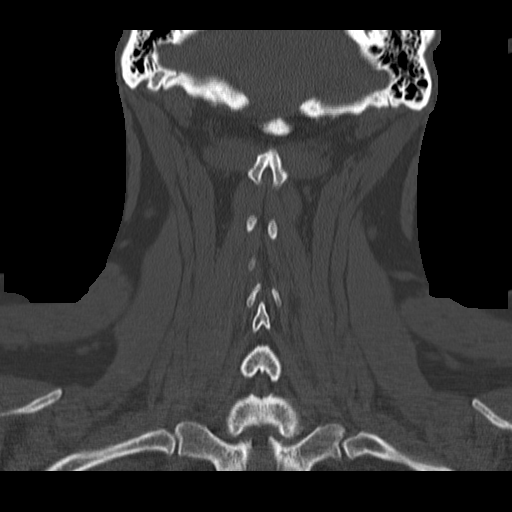

[16 of 33 positions shown; findings below may reference images not displayed]

FINDINGS: There is no evidence of fracture, dislocation or malalignment. The
patient is status post anterior fusion of the C4-5, and C6 vertebral bodies.
The hardware is intact without evidence of loosening or failure. There is no
evidence of canal stenosis.
IMPRESSION: 1. Postsurgical changes without evidence of acute osseous abnormalities.

Dr. ISMAR of the Emergency Department was informed of these findings via
a preliminary faxed report on [DATE] at [DATE] p.m. Central Time.

## 2010-06-03 DIAGNOSIS — R32 Unspecified urinary incontinence: Secondary | ICD-10-CM | POA: Insufficient documentation

## 2010-06-03 DIAGNOSIS — I10 Essential (primary) hypertension: Secondary | ICD-10-CM | POA: Insufficient documentation

## 2010-06-03 HISTORY — DX: Unspecified urinary incontinence: R32

## 2010-11-06 ENCOUNTER — Inpatient Hospital Stay: Payer: Self-pay | Admitting: Psychiatry

## 2011-03-09 ENCOUNTER — Emergency Department: Payer: Self-pay | Admitting: Emergency Medicine

## 2011-11-24 ENCOUNTER — Emergency Department: Payer: Self-pay | Admitting: Emergency Medicine

## 2011-11-24 LAB — URINALYSIS, COMPLETE
Bilirubin,UR: NEGATIVE
Blood: NEGATIVE
Ketone: NEGATIVE
Nitrite: NEGATIVE
Protein: NEGATIVE
Specific Gravity: 1.017 (ref 1.003–1.030)
Squamous Epithelial: 3
WBC UR: 17 /HPF (ref 0–5)

## 2011-12-24 ENCOUNTER — Emergency Department: Payer: Self-pay | Admitting: Emergency Medicine

## 2012-09-01 ENCOUNTER — Emergency Department: Payer: Self-pay | Admitting: Emergency Medicine

## 2012-09-01 LAB — CBC
HCT: 38.9 % (ref 35.0–47.0)
HGB: 13.5 g/dL (ref 12.0–16.0)
MCH: 29.8 pg (ref 26.0–34.0)
MCHC: 34.6 g/dL (ref 32.0–36.0)
MCV: 86 fL (ref 80–100)
RDW: 13 % (ref 11.5–14.5)
WBC: 11.2 10*3/uL — ABNORMAL HIGH (ref 3.6–11.0)

## 2012-09-01 LAB — BASIC METABOLIC PANEL
Anion Gap: 5 — ABNORMAL LOW (ref 7–16)
BUN: 17 mg/dL (ref 7–18)
Calcium, Total: 8.4 mg/dL — ABNORMAL LOW (ref 8.5–10.1)
Chloride: 110 mmol/L — ABNORMAL HIGH (ref 98–107)
Co2: 24 mmol/L (ref 21–32)
EGFR (African American): >60
EGFR (Non-African Amer.): >60
Osmolality: 279 (ref 275–301)

## 2012-10-25 ENCOUNTER — Emergency Department: Payer: Self-pay | Admitting: Emergency Medicine

## 2012-10-26 ENCOUNTER — Emergency Department: Payer: Self-pay | Admitting: Unknown Physician Specialty

## 2012-10-27 LAB — CBC WITH DIFFERENTIAL/PLATELET
Basophil #: 0.1 10*3/uL (ref 0.0–0.1)
Basophil %: 0.9 %
Eosinophil #: 0.3 10*3/uL (ref 0.0–0.7)
Eosinophil %: 3.2 %
HCT: 41.3 % (ref 35.0–47.0)
HGB: 14.1 g/dL (ref 12.0–16.0)
Lymphocyte #: 4.5 10*3/uL — ABNORMAL HIGH (ref 1.0–3.6)
Lymphocyte %: 42.7 %
MCHC: 34.1 g/dL (ref 32.0–36.0)
MCV: 87 fL (ref 80–100)
Monocyte %: 7.1 %
Neutrophil #: 4.8 10*3/uL (ref 1.4–6.5)
RBC: 4.78 10*6/uL (ref 3.80–5.20)
RDW: 13 % (ref 11.5–14.5)
WBC: 10.5 10*3/uL (ref 3.6–11.0)

## 2012-10-27 LAB — COMPREHENSIVE METABOLIC PANEL
Alkaline Phosphatase: 58 U/L (ref 50–136)
Anion Gap: 8 (ref 7–16)
Bilirubin,Total: 0.3 mg/dL (ref 0.2–1.0)
Calcium, Total: 8.7 mg/dL (ref 8.5–10.1)
Chloride: 106 mmol/L (ref 98–107)
Co2: 25 mmol/L (ref 21–32)
EGFR (African American): >60
EGFR (Non-African Amer.): >60
Sodium: 139 mmol/L (ref 136–145)

## 2012-10-27 LAB — SEDIMENTATION RATE: Erythrocyte Sed Rate: 6 mm/hr (ref 0–20)

## 2012-10-27 IMAGING — CT CT HEAD WITHOUT CONTRAST
1 series · 16 of 30 positions shown, 20 images · non-contrast
Comparison: none

REASON FOR EXAM: HA
COMMENTS:

[Series 2: soft tissue · axial · 0.46mm/px · z∈[-150,-14]mm · 16 of 31 slices shown, 20 images]
[im 2/31  brain]
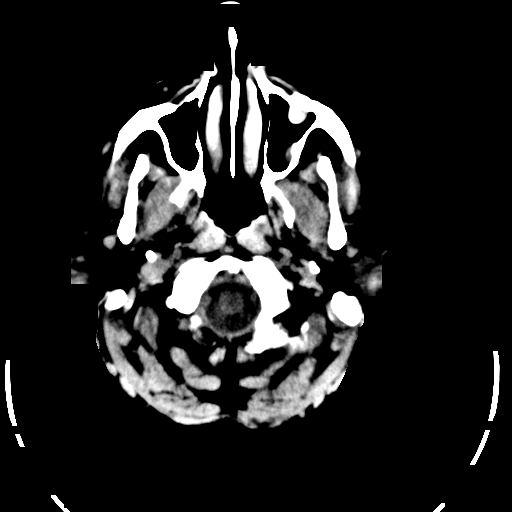
[im 2/31  bone]
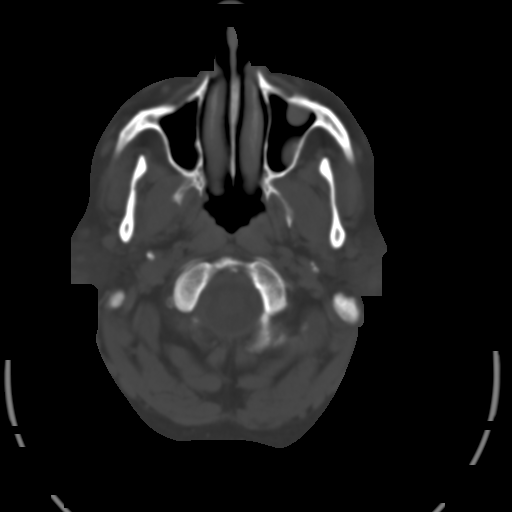
[im 4/31  brain]
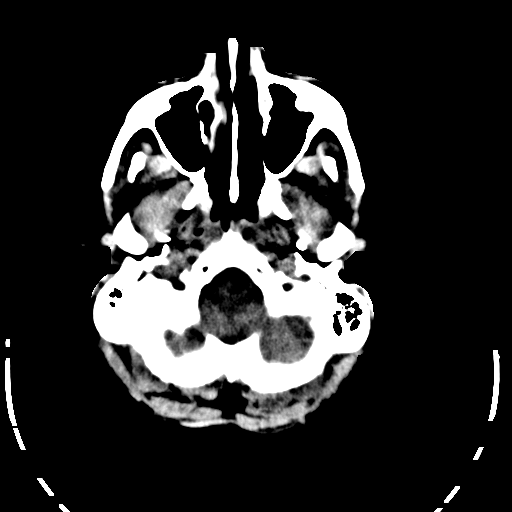
[im 6/31  brain]
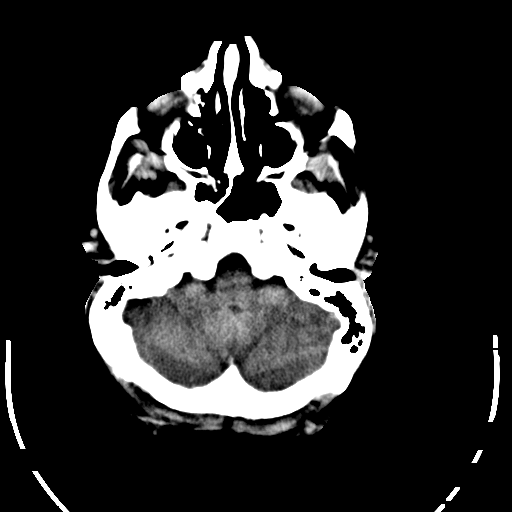
[im 8/31  brain]
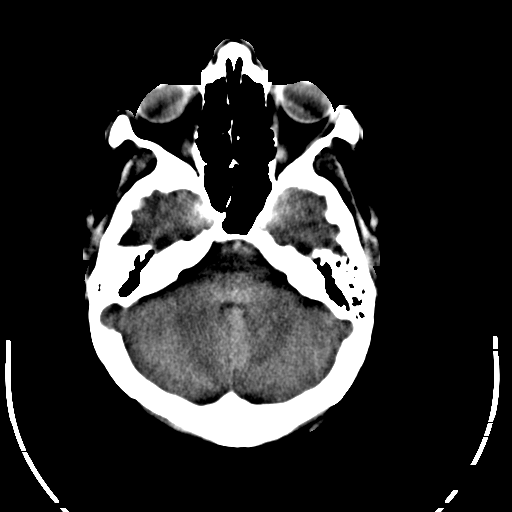
[im 9/31  brain]
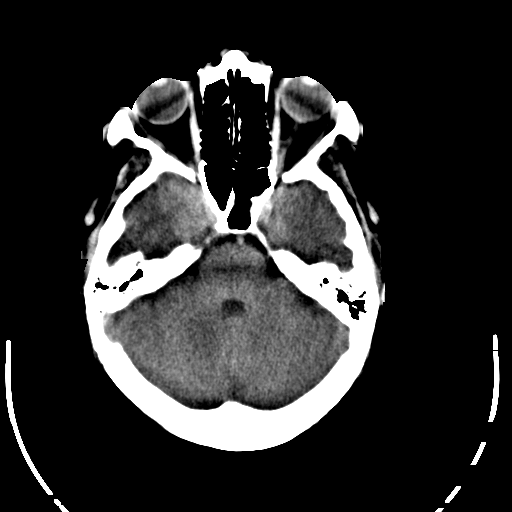
[im 9/31  bone]
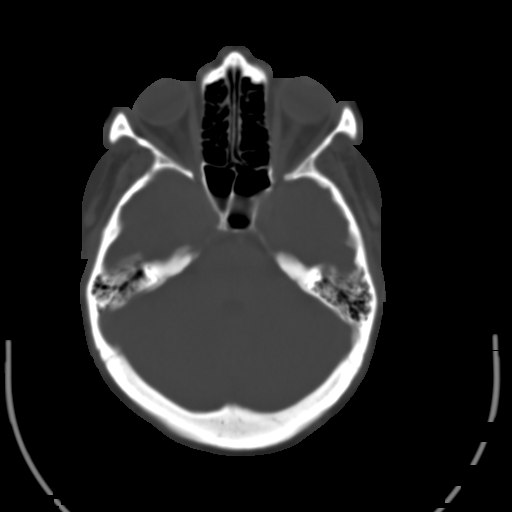
[im 11/31  brain]
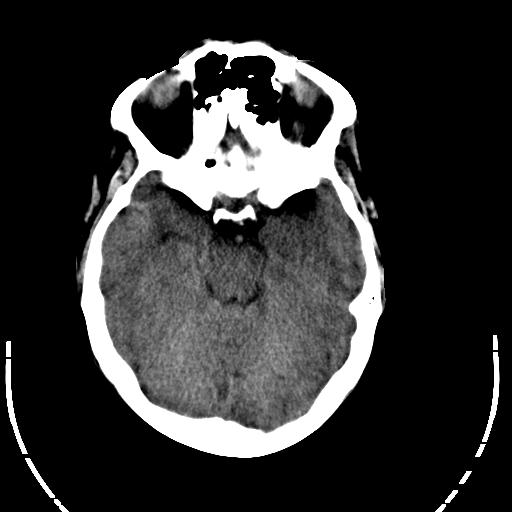
[im 13/31  brain]
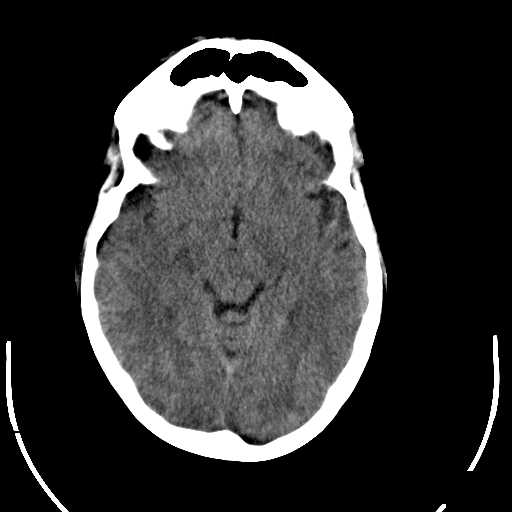
[im 15/31  brain]
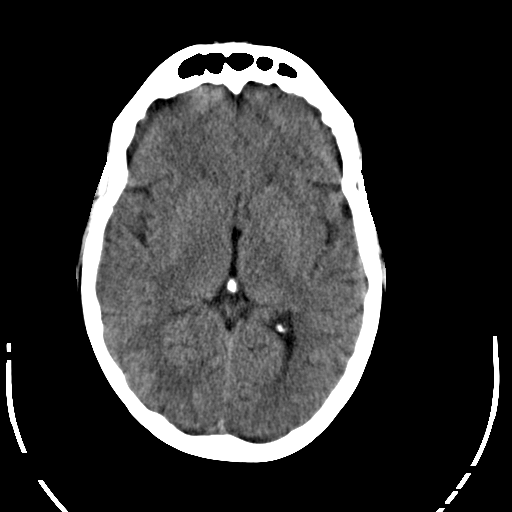
[im 16/31  brain]
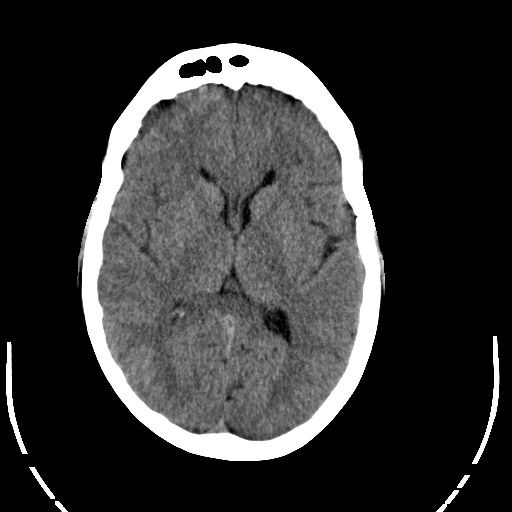
[im 16/31  bone]
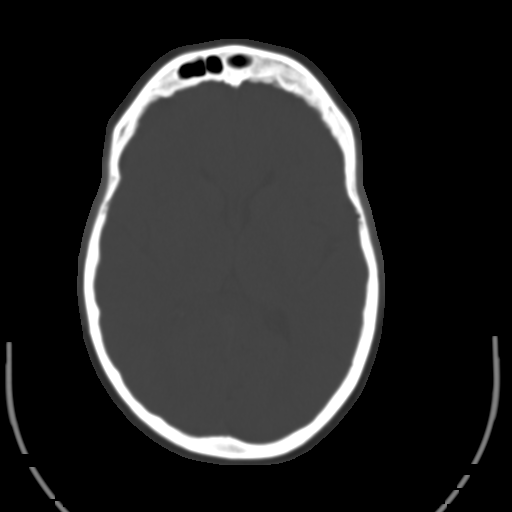
[im 18/31  brain]
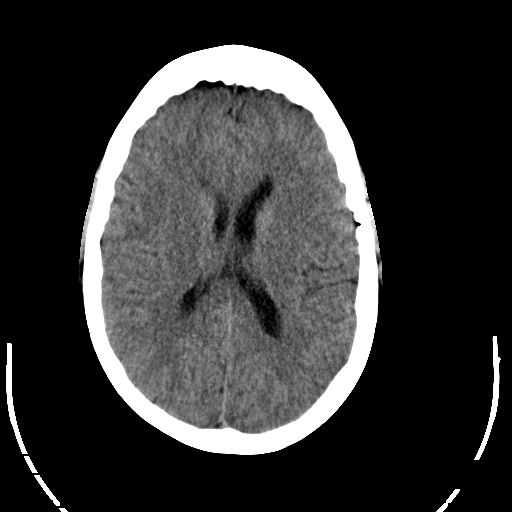
[im 20/31  brain]
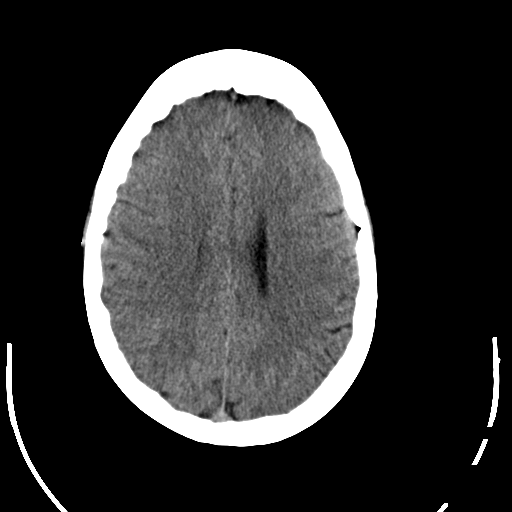
[im 22/31  brain]
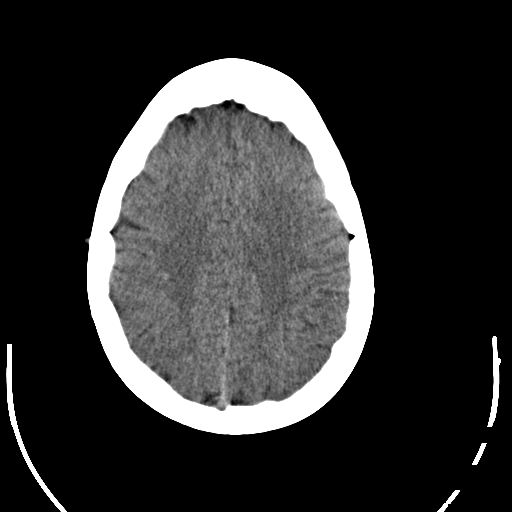
[im 23/31  brain]
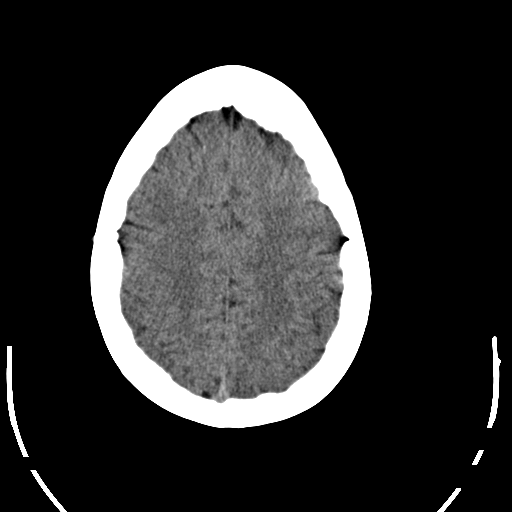
[im 23/31  bone]
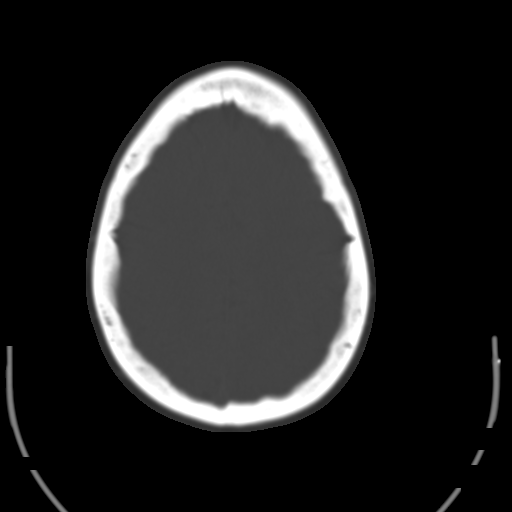
[im 25/31  brain]
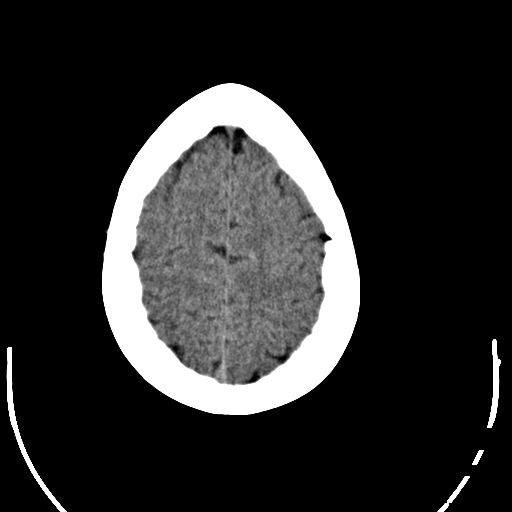
[im 27/31  brain]
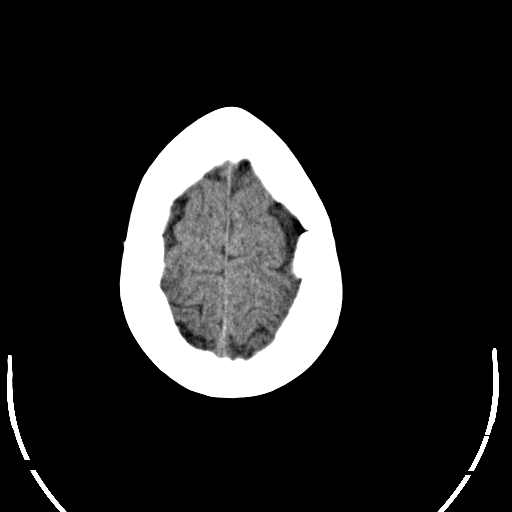
[im 29/31  brain]
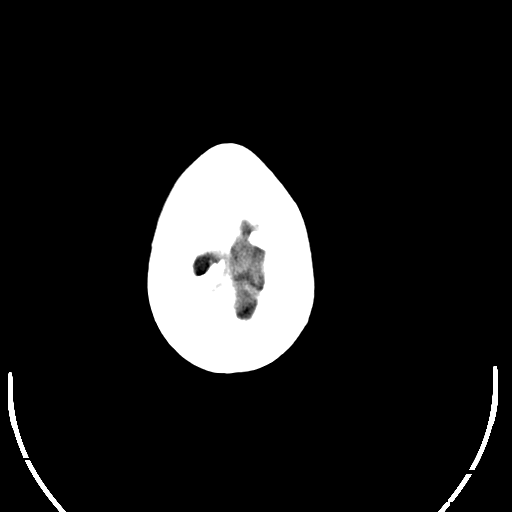

[16 of 30 positions shown; findings below may reference images not displayed]

PROCEDURE:     CT  - CT HEAD WITHOUT CONTRAST  - [DATE]  [DATE]

RESULT:     Emergent noncontrast CT of the brain is performed and compared
to the previous exam dated [DATE].

Mucous retention cysts are present in the left maxillary sinus. The sinuses
and mastoids otherwise appear unremarkable. The calvarium is intact. The
orbital structures appear to be within normal limits. The ventricles and
sulci are normal. There is no intracranial hemorrhage, mass or mass effect.
There is no midline shift.
IMPRESSION: No acute intracranial abnormality. Left maxillary mucous
retention cysts. The appearance is stable.

[REDACTED]

## 2012-11-06 ENCOUNTER — Ambulatory Visit: Payer: Self-pay

## 2012-11-08 ENCOUNTER — Emergency Department: Payer: Self-pay | Admitting: Emergency Medicine

## 2013-02-17 ENCOUNTER — Emergency Department: Payer: Self-pay | Admitting: Emergency Medicine

## 2013-02-17 IMAGING — CR RIGHT FOOT COMPLETE - 3+ VIEW
1 series · 3 of 3 positions shown · non-contrast
Comparison: none

REASON FOR EXAM: pain x 2 days    flex 42
COMMENTS:

PROCEDURE:     DXR - DXR FOOT RT COMPLETE W/OBLIQUES  - [DATE] [DATE]
RESULT:     Comparison: None.

[Series 1: x foot ap right · 0.14mm/px · 3 of 3 slices shown]
[im 1/3]
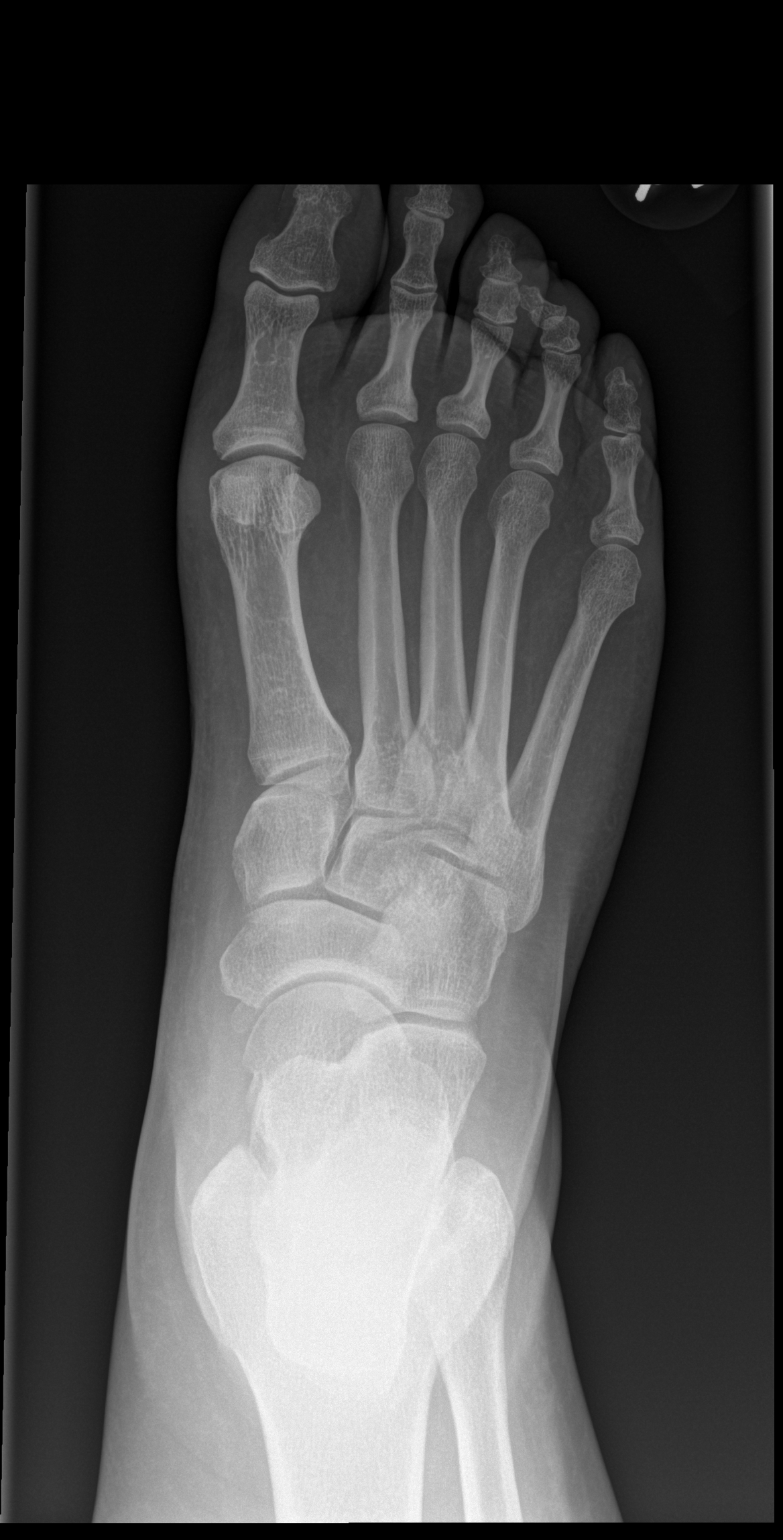
[im 2/3]
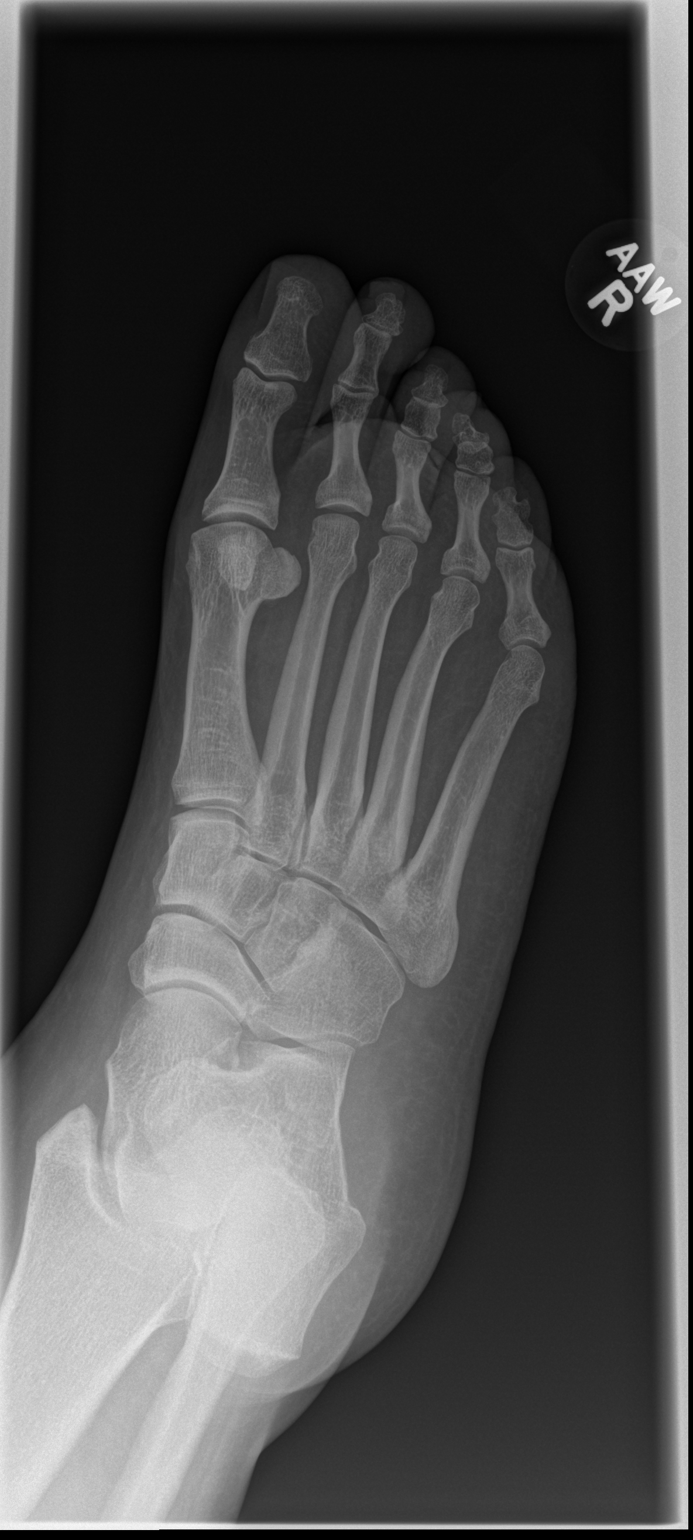
[im 3/3]
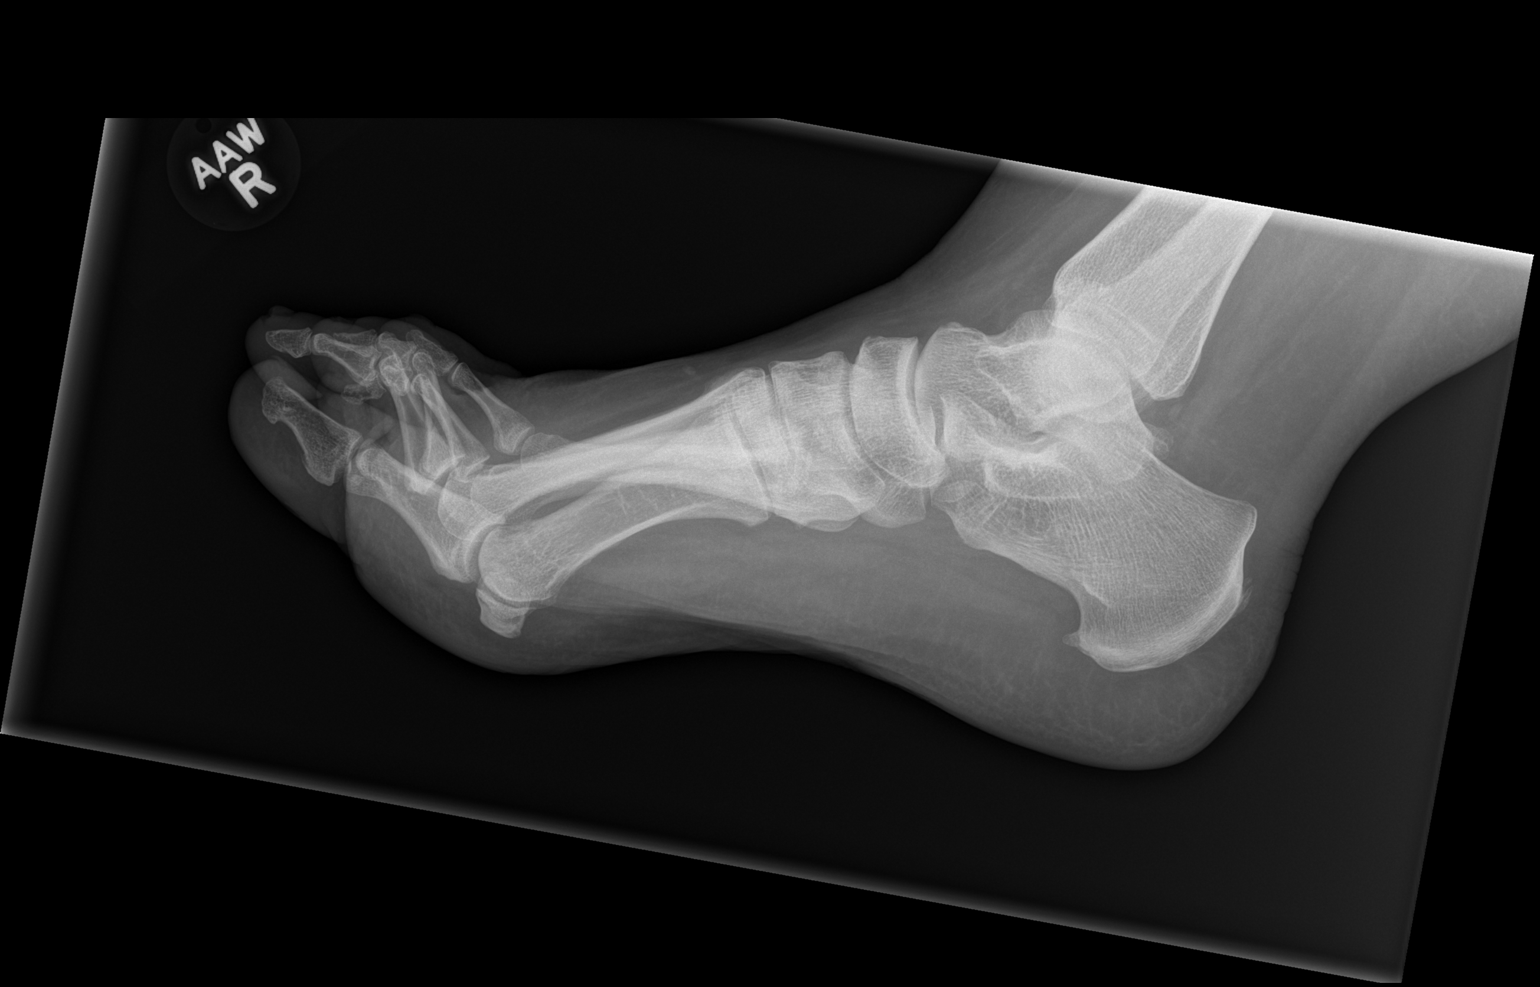

[3 of 3 positions shown; findings below may reference images not displayed]

FINDINGS: No acute fracture. Normal alignment. Joint spaces are maintained.
IMPRESSION: No acute findings.

[REDACTED]

## 2013-02-20 ENCOUNTER — Emergency Department: Payer: Self-pay | Admitting: Emergency Medicine

## 2013-04-15 ENCOUNTER — Emergency Department: Payer: Self-pay | Admitting: Emergency Medicine

## 2013-06-18 ENCOUNTER — Emergency Department: Payer: Self-pay | Admitting: Emergency Medicine

## 2013-09-12 DIAGNOSIS — N3000 Acute cystitis without hematuria: Secondary | ICD-10-CM

## 2013-09-12 HISTORY — DX: Acute cystitis without hematuria: N30.00

## 2013-11-14 ENCOUNTER — Emergency Department: Payer: Self-pay | Admitting: Emergency Medicine

## 2013-11-14 LAB — URINALYSIS, COMPLETE
Bilirubin,UR: NEGATIVE
Blood: NEGATIVE
Glucose,UR: NEGATIVE mg/dL (ref 0–75)
Ketone: NEGATIVE
NITRITE: POSITIVE
PROTEIN: NEGATIVE
Ph: 5 (ref 4.5–8.0)
RBC,UR: 2 /HPF (ref 0–5)
Specific Gravity: 1.006 (ref 1.003–1.030)

## 2013-11-23 ENCOUNTER — Emergency Department: Payer: Self-pay | Admitting: Emergency Medicine

## 2013-11-23 LAB — URINALYSIS, COMPLETE
Bilirubin,UR: NEGATIVE
Blood: NEGATIVE
Glucose,UR: NEGATIVE mg/dL (ref 0–75)
KETONE: NEGATIVE
NITRITE: NEGATIVE
PH: 6 (ref 4.5–8.0)
PROTEIN: NEGATIVE
RBC,UR: 30 /HPF (ref 0–5)
Specific Gravity: 1.019 (ref 1.003–1.030)
WBC UR: 54 /HPF (ref 0–5)

## 2013-11-23 LAB — BASIC METABOLIC PANEL
ANION GAP: 3 — AB (ref 7–16)
BUN: 13 mg/dL (ref 7–18)
CHLORIDE: 104 mmol/L (ref 98–107)
CO2: 30 mmol/L (ref 21–32)
Calcium, Total: 9 mg/dL (ref 8.5–10.1)
Creatinine: 0.88 mg/dL (ref 0.60–1.30)
EGFR (African American): >60
EGFR (Non-African Amer.): >60
Glucose: 96 mg/dL (ref 65–99)
Osmolality: 274 (ref 275–301)
Potassium: 4 mmol/L (ref 3.5–5.1)
Sodium: 137 mmol/L (ref 136–145)

## 2013-11-23 LAB — CBC
HCT: 42.5 % (ref 35.0–47.0)
HGB: 14.5 g/dL (ref 12.0–16.0)
MCH: 29.4 pg (ref 26.0–34.0)
MCHC: 34.1 g/dL (ref 32.0–36.0)
MCV: 86 fL (ref 80–100)
PLATELETS: 275 10*3/uL (ref 150–440)
RBC: 4.93 10*6/uL (ref 3.80–5.20)
RDW: 13.4 % (ref 11.5–14.5)
WBC: 11.7 10*3/uL — ABNORMAL HIGH (ref 3.6–11.0)

## 2013-11-23 LAB — WET PREP, GENITAL

## 2013-11-23 LAB — GC/CHLAMYDIA PROBE AMP

## 2013-12-18 ENCOUNTER — Emergency Department: Payer: Self-pay | Admitting: Internal Medicine

## 2013-12-18 DIAGNOSIS — R1031 Right lower quadrant pain: Secondary | ICD-10-CM | POA: Insufficient documentation

## 2013-12-18 LAB — URINALYSIS, COMPLETE
Bacteria: NONE SEEN
Bilirubin,UR: NEGATIVE
Blood: NEGATIVE
Glucose,UR: NEGATIVE mg/dL
Ketone: NEGATIVE
Leukocyte Esterase: NEGATIVE
Nitrite: NEGATIVE
Ph: 6
Protein: NEGATIVE
RBC,UR: 1 /HPF
Specific Gravity: 1.019
Squamous Epithelial: 5
WBC UR: 1 /HPF

## 2013-12-18 LAB — CBC WITH DIFFERENTIAL/PLATELET
Basophil #: 0.1 10*3/uL (ref 0.0–0.1)
Basophil %: 0.6 %
EOS ABS: 0.3 10*3/uL (ref 0.0–0.7)
EOS PCT: 3.2 %
HCT: 41.1 % (ref 35.0–47.0)
HGB: 13.9 g/dL (ref 12.0–16.0)
LYMPHS PCT: 11.9 %
Lymphocyte #: 1 10*3/uL (ref 1.0–3.6)
MCH: 29.3 pg (ref 26.0–34.0)
MCHC: 33.9 g/dL (ref 32.0–36.0)
MCV: 86 fL (ref 80–100)
Monocyte #: 0.5 x10 3/mm (ref 0.2–0.9)
Monocyte %: 5.3 %
NEUTROS ABS: 6.8 10*3/uL — AB (ref 1.4–6.5)
Neutrophil %: 79 %
Platelet: 225 10*3/uL (ref 150–440)
RBC: 4.76 10*6/uL (ref 3.80–5.20)
RDW: 13.1 % (ref 11.5–14.5)
WBC: 8.7 10*3/uL (ref 3.6–11.0)

## 2013-12-18 LAB — COMPREHENSIVE METABOLIC PANEL WITH GFR
Albumin: 3.3 g/dL — ABNORMAL LOW
Alkaline Phosphatase: 63 U/L
Anion Gap: 6 — ABNORMAL LOW
BUN: 14 mg/dL
Bilirubin,Total: 0.6 mg/dL
Calcium, Total: 7.7 mg/dL — ABNORMAL LOW
Chloride: 106 mmol/L
Co2: 25 mmol/L
Creatinine: 0.75 mg/dL
EGFR (African American): >60
EGFR (Non-African Amer.): >60
Glucose: 97 mg/dL
Osmolality: 274
Potassium: 3.8 mmol/L
SGOT(AST): 19 U/L
SGPT (ALT): 20 U/L
Sodium: 137 mmol/L
Total Protein: 7.2 g/dL

## 2013-12-18 LAB — LIPASE, BLOOD: Lipase: 119 U/L

## 2013-12-18 IMAGING — CT CT ABD-PELV W/ CM
2 of 5 series · 17 of 46 positions shown, 19 images · IV contrast (isovue)
Comparison: None.

ADDENDUM:
This addendum is given for the purpose of noting the mild
intrahepatic biliary ductal dilatation could be secondary to
cholecystectomy. The common bile duct is normal. Correlation with
liver function tests could be used for further evaluation.
CLINICAL DATA: Right lower quadrant abdominal pain for several
weeks, worse today.

EXAM:
CT ABDOMEN AND PELVIS WITH CONTRAST
TECHNIQUE: Multidetector CT imaging of the abdomen and pelvis was performed
using the standard protocol following bolus administration of
intravenous contrast.
CONTRAST:  125 cc Isovue 370.

[Series 2: routine abd pel with · axial · 0.84mm/px · z∈[-192,+228]mm · 14 of 96 slices shown, 16 images]
[im 6/96  soft-tissue]
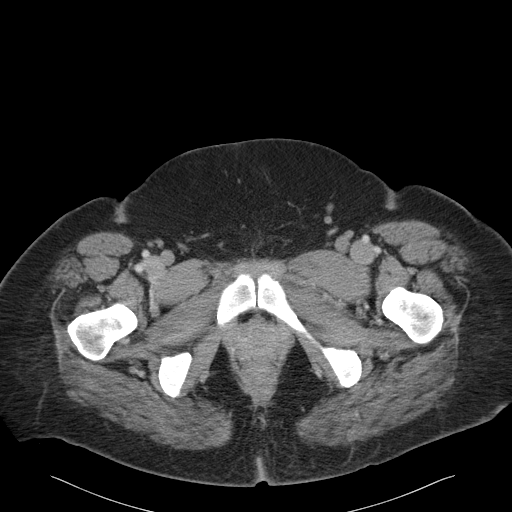
[im 6/96  bone]
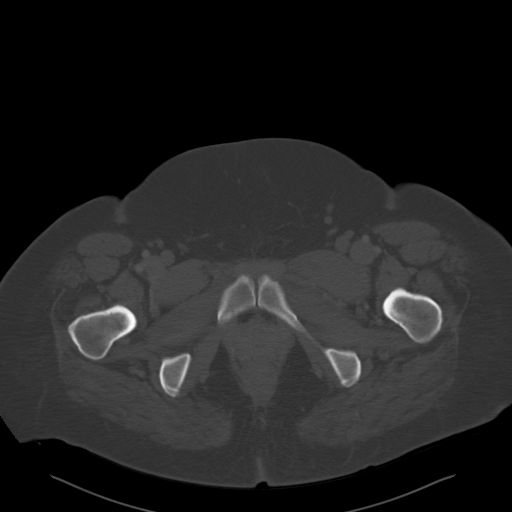
[im 11/96  soft-tissue]
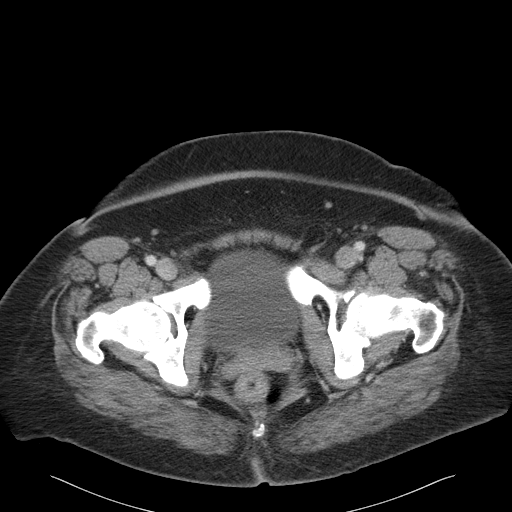
[im 22/96  soft-tissue]
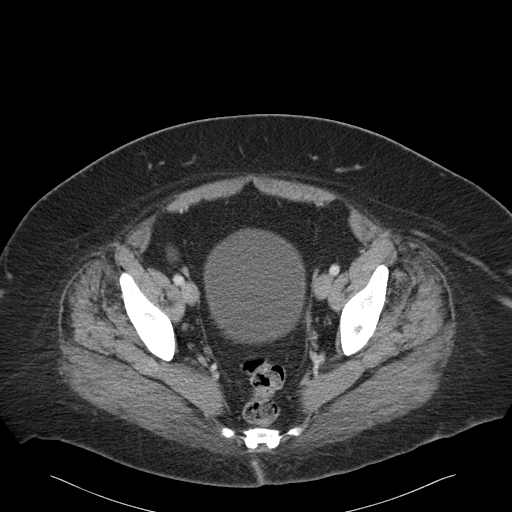
[im 27/96  soft-tissue]
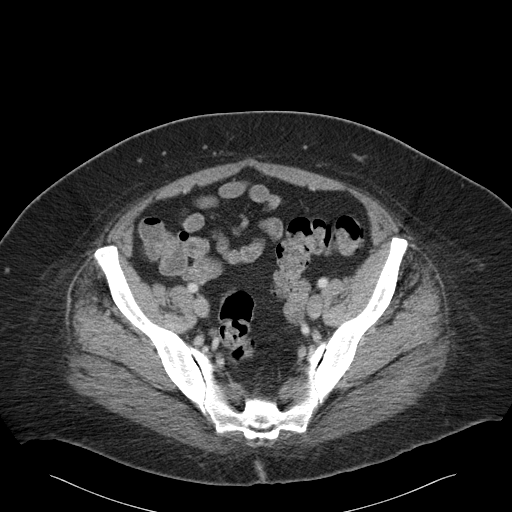
[im 32/96  soft-tissue]
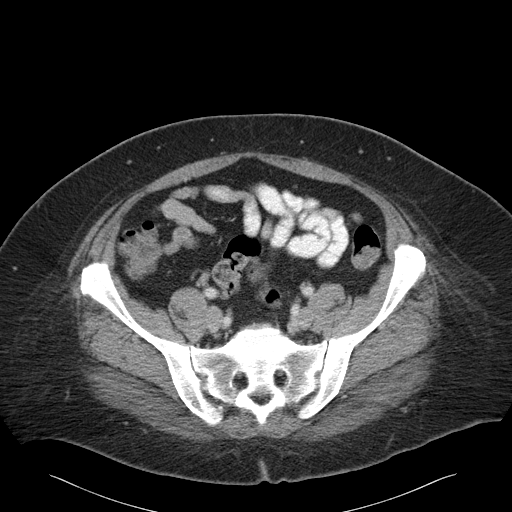
[im 37/96  soft-tissue]
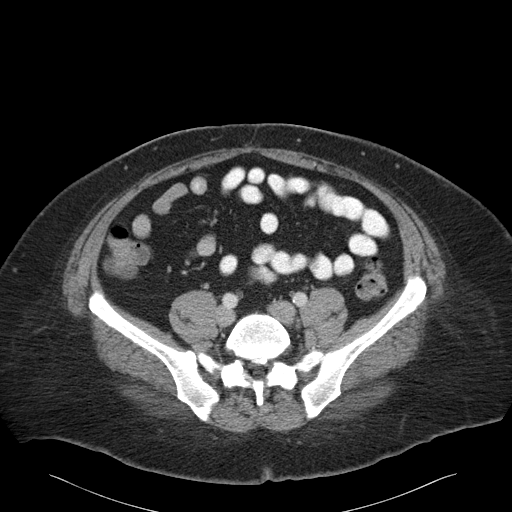
[im 43/96  soft-tissue]
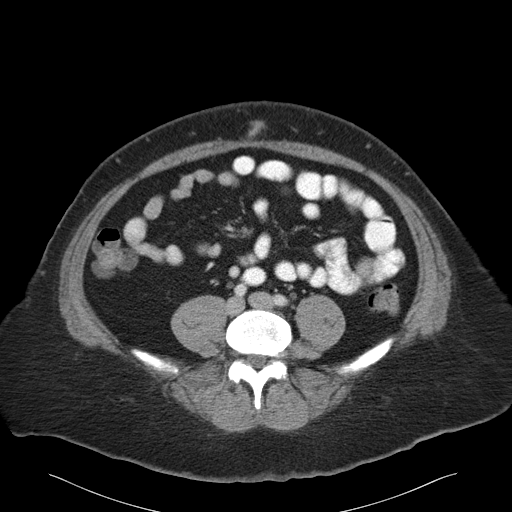
[im 53/96  soft-tissue]
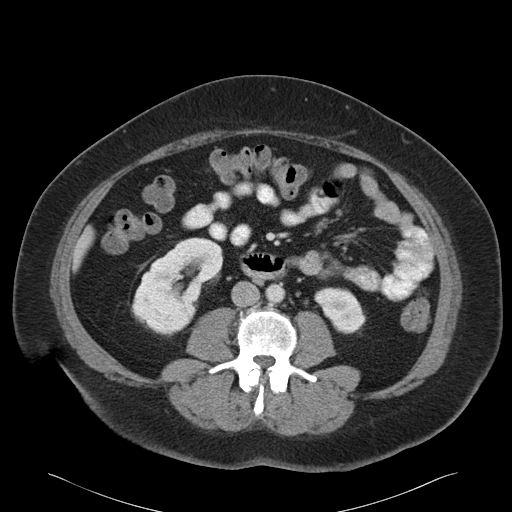
[im 59/96  soft-tissue]
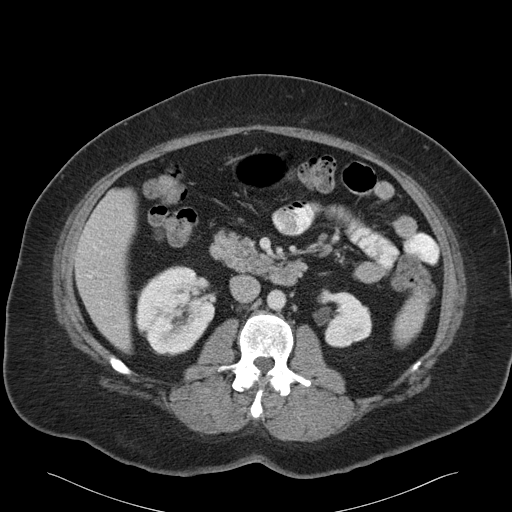
[im 59/96  bone]
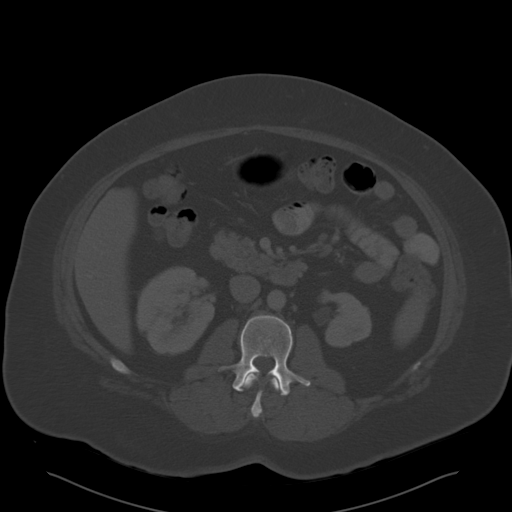
[im 64/96  soft-tissue]
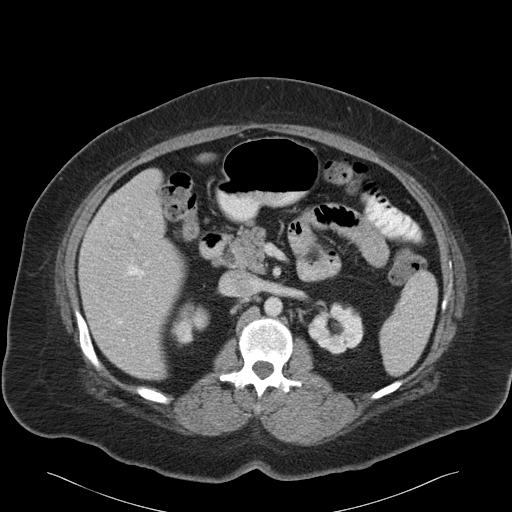
[im 69/96  soft-tissue]
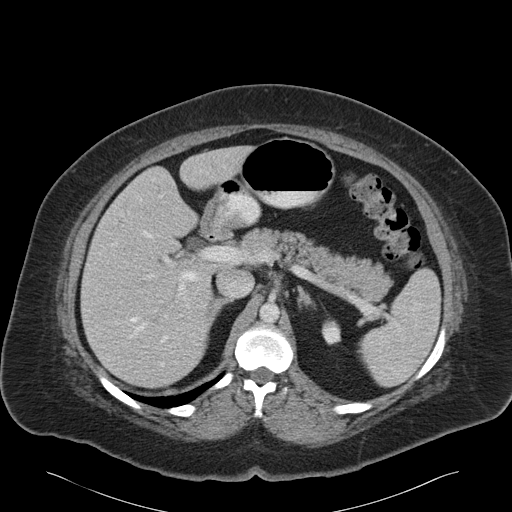
[im 74/96  soft-tissue]
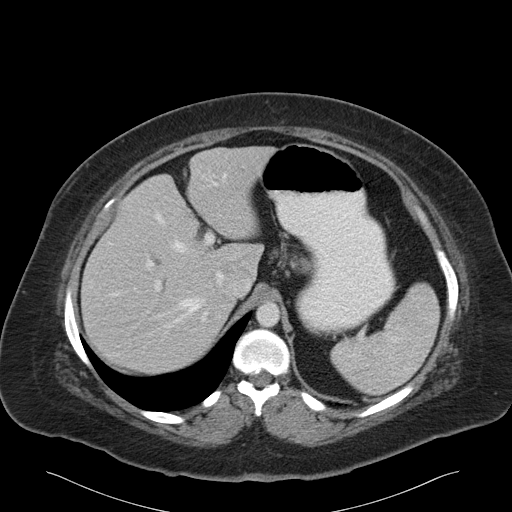
[im 85/96  soft-tissue]
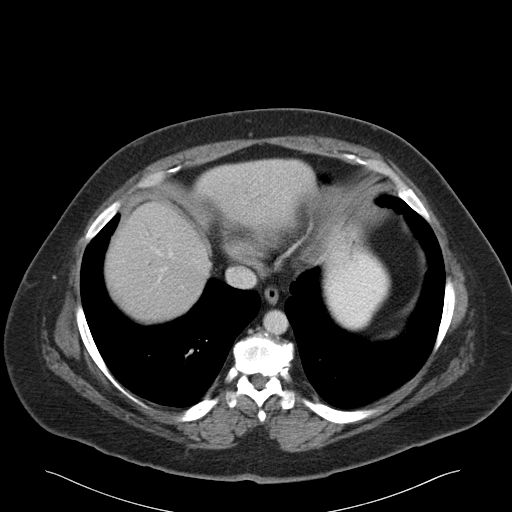
[im 90/96  soft-tissue]
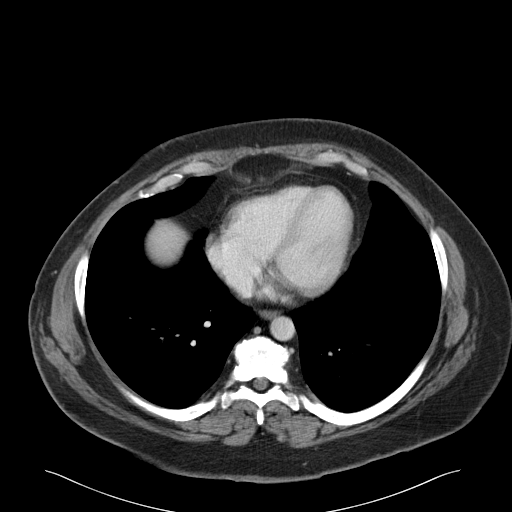

[Series 6: cor routine abd pel with · coronal · 0.97mm/px · 3 of 165 slices shown]
[im 55/165  soft-tissue]
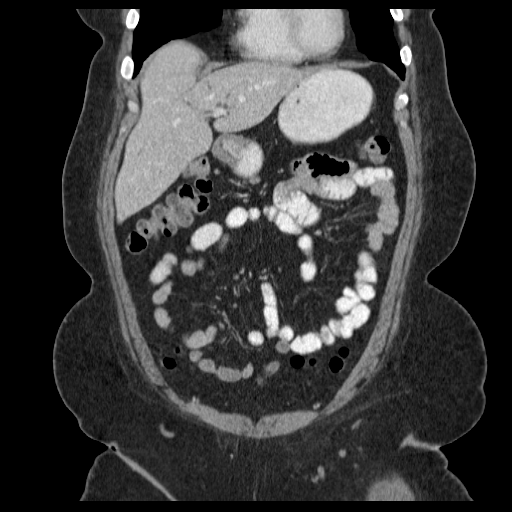
[im 73/165  soft-tissue]
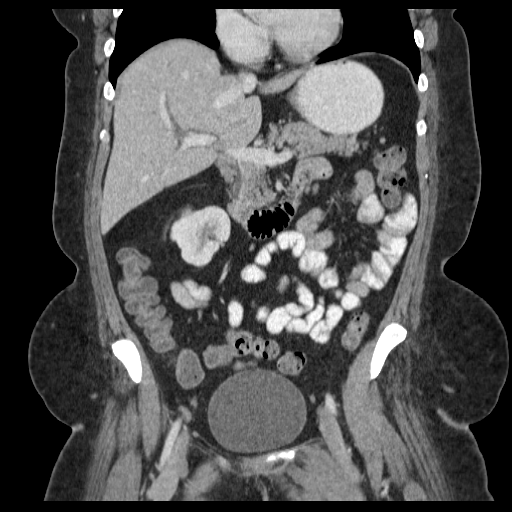
[im 92/165  soft-tissue]
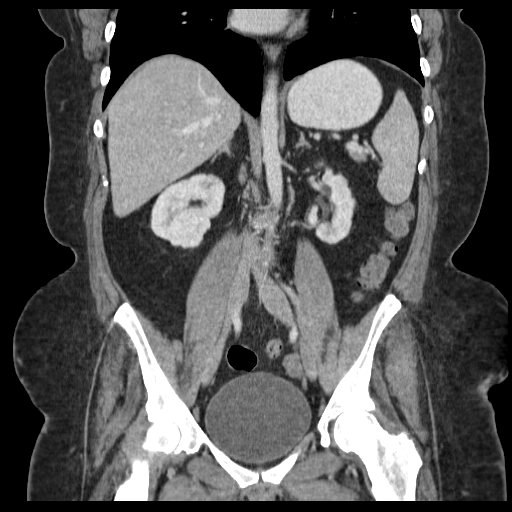

[17 of 46 positions shown; findings below may reference images not displayed]

FINDINGS: The lung bases are clear.  No pleural or pericardial effusion.

The patient is status post cholecystectomy. There is mild
intrahepatic biliary ductal dilatation. No focal liver lesion is
identified. The common bile duct is unremarkable. The adrenal
glands, spleen and pancreas appear normal. The left kidney is
atrophic with scarring present. Compensatory hypertrophy of the
right kidney is noted. The stomach, small and large bowel and
appendix appear normal. There is no lymphadenopathy or fluid. No
focal bony abnormality is identified.
IMPRESSION: Negative for appendicitis. No acute finding or finding to explain
the patient's symptoms.

Atrophic left kidney with renal scarring may be due to reflux.

## 2014-01-16 DIAGNOSIS — M7660 Achilles tendinitis, unspecified leg: Secondary | ICD-10-CM

## 2014-01-16 HISTORY — DX: Achilles tendinitis, unspecified leg: M76.60

## 2014-01-23 DIAGNOSIS — N261 Atrophy of kidney (terminal): Secondary | ICD-10-CM | POA: Insufficient documentation

## 2014-02-02 ENCOUNTER — Ambulatory Visit: Payer: Self-pay | Admitting: Urology

## 2014-02-02 IMAGING — NM NM RENOGRAM W/ LASIX
3 series · 18 of 18 positions shown · non-contrast
Comparison: None.

CLINICAL DATA: Left renal atrophy seen on CT.

EXAM:
NUCLEAR MEDICINE RENAL SCAN WITH DIURETIC ADMINISTRATION
TECHNIQUE: Radionuclide angiographic and sequential renal images were obtained
after intravenous injection of radiopharmaceutical. Imaging was
continued during slow intravenous injection of Lasix approximately
15 minutes after the start of the examination.
RADIOPHARMACEUTICALS:  4.9 mCi [GQ] MAG3

[Series 1000: lasix renal mag 3 (first dynamic renal results) · 7.79mm/px · 6 of 108 frames shown]
[frame 10/108]
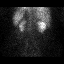
[frame 28/108]
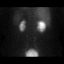
[frame 46/108]
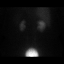
[frame 64/108]
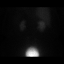
[frame 82/108]
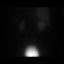
[frame 100/108]
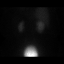

[Series 1000: lasix renal mag 3 · 7.79mm/px · 6 of 108 frames shown]
[frame 10/108]
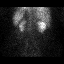
[frame 28/108]
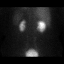
[frame 46/108]
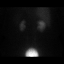
[frame 64/108]
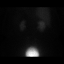
[frame 82/108]
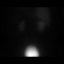
[frame 100/108]
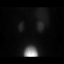

[Series 1000: lasix renal mag 3 (results) · 7.79mm/px · 6 of 108 frames shown]
[frame 10/108]
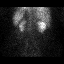
[frame 28/108]
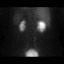
[frame 46/108]
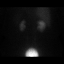
[frame 64/108]
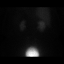
[frame 82/108]
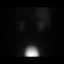
[frame 100/108]
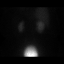

[18 of 18 positions shown; findings below may reference images not displayed]

FINDINGS: Flow:  Prompt symmetric arterial flow to the kidneys.

Left renogram: Mildly diminished uptake compared to the right kidney
but with prompt excretion and no evidence of obstruction.

Right renogram: Normal uptake and excretion.

Differential:

Left kidney = 35% %

Right kidney = 65% %
IMPRESSION: Asymmetric split renal function as above, with the right kidney
functioning more than the left. No evidence of obstruction.

## 2014-04-12 ENCOUNTER — Emergency Department: Payer: Self-pay | Admitting: Emergency Medicine

## 2014-04-12 LAB — URINALYSIS, COMPLETE
Bilirubin,UR: NEGATIVE
Glucose,UR: NEGATIVE mg/dL (ref 0–75)
Ketone: NEGATIVE
NITRITE: NEGATIVE
PH: 5 (ref 4.5–8.0)
PROTEIN: NEGATIVE
RBC,UR: 58 /HPF (ref 0–5)
Specific Gravity: 1.011 (ref 1.003–1.030)
Squamous Epithelial: 2
WBC UR: 64 /HPF (ref 0–5)

## 2014-04-12 LAB — GC/CHLAMYDIA PROBE AMP

## 2014-04-12 LAB — WET PREP, GENITAL

## 2014-06-04 ENCOUNTER — Emergency Department: Payer: Self-pay | Admitting: Internal Medicine

## 2014-07-20 ENCOUNTER — Emergency Department: Payer: Self-pay | Admitting: Emergency Medicine

## 2014-07-20 IMAGING — CR RIGHT HIP - COMPLETE 2+ VIEW
1 series · 3 of 3 positions shown · non-contrast
Comparison: CT abdomen and pelvis [DATE].

CLINICAL DATA: Status post fall 1 week ago with continued right hip
pain. Initial encounter.

EXAM:
RIGHT HIP - COMPLETE 2+ VIEW

[Series 1: dxr hip right complete · 0.14mm/px · 3 of 3 slices shown]
[im 1/3]
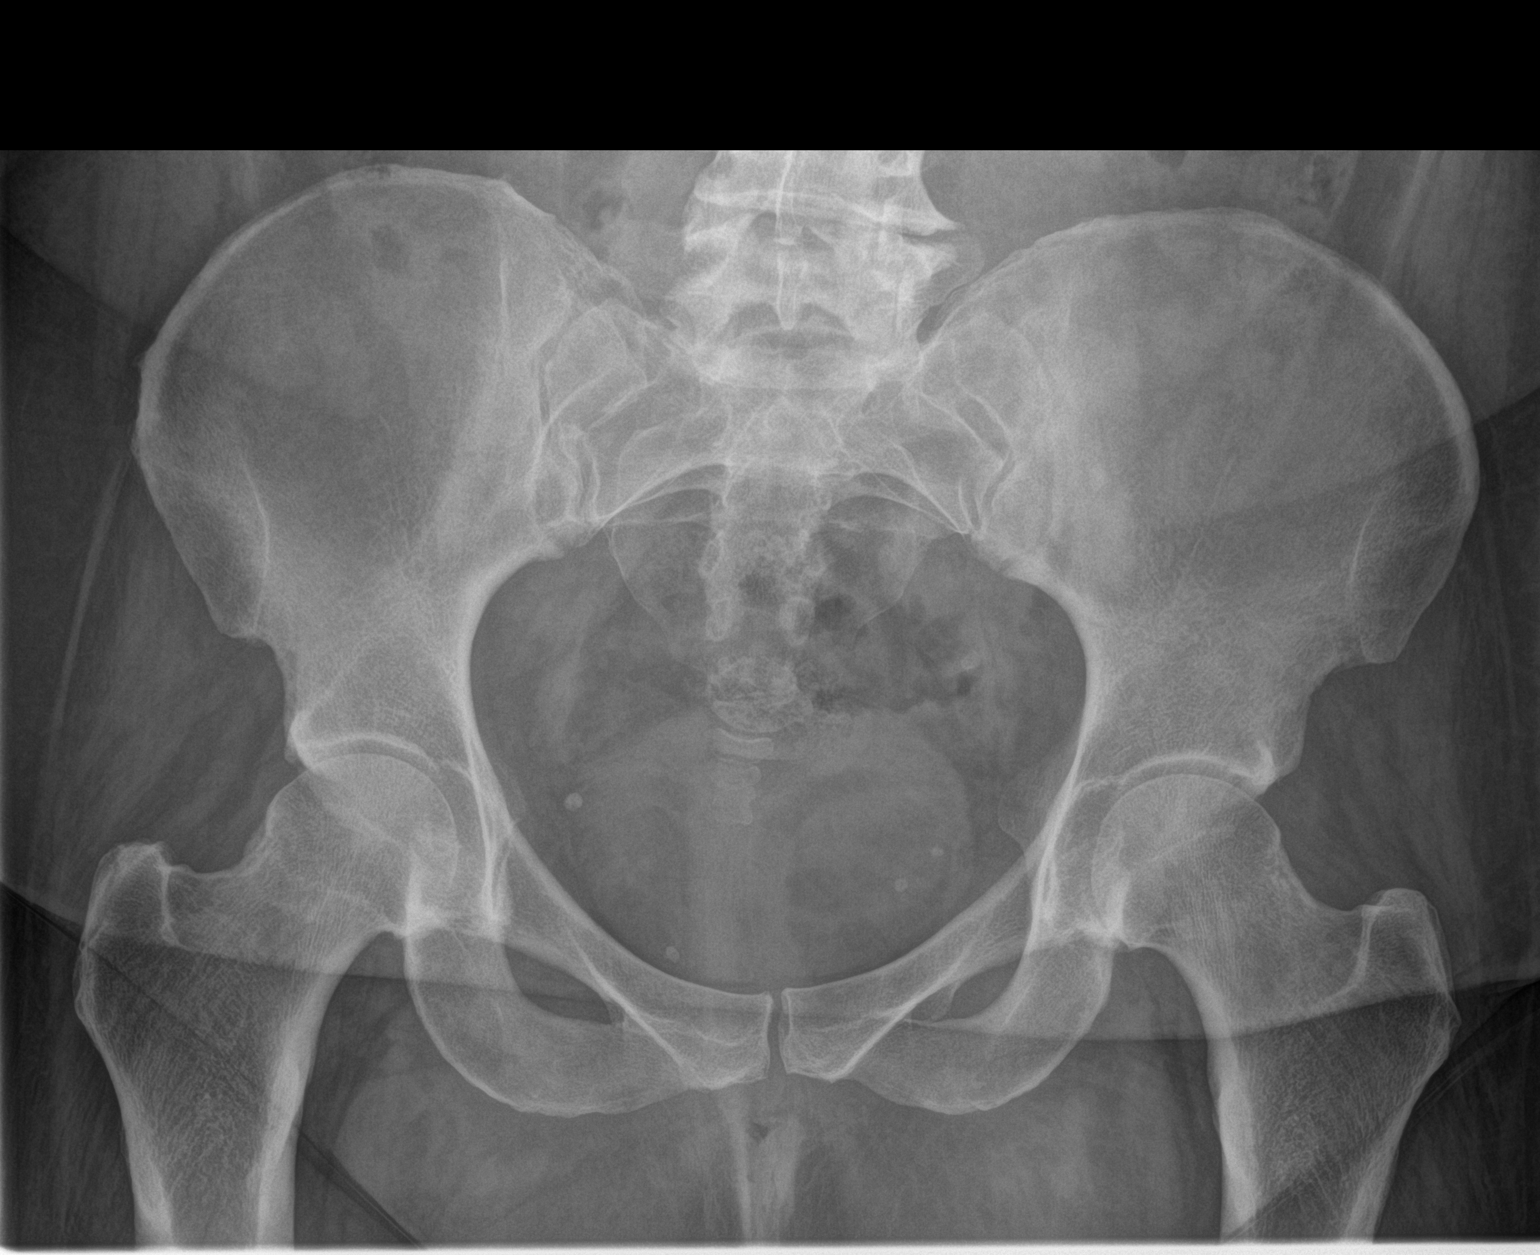
[im 2/3]
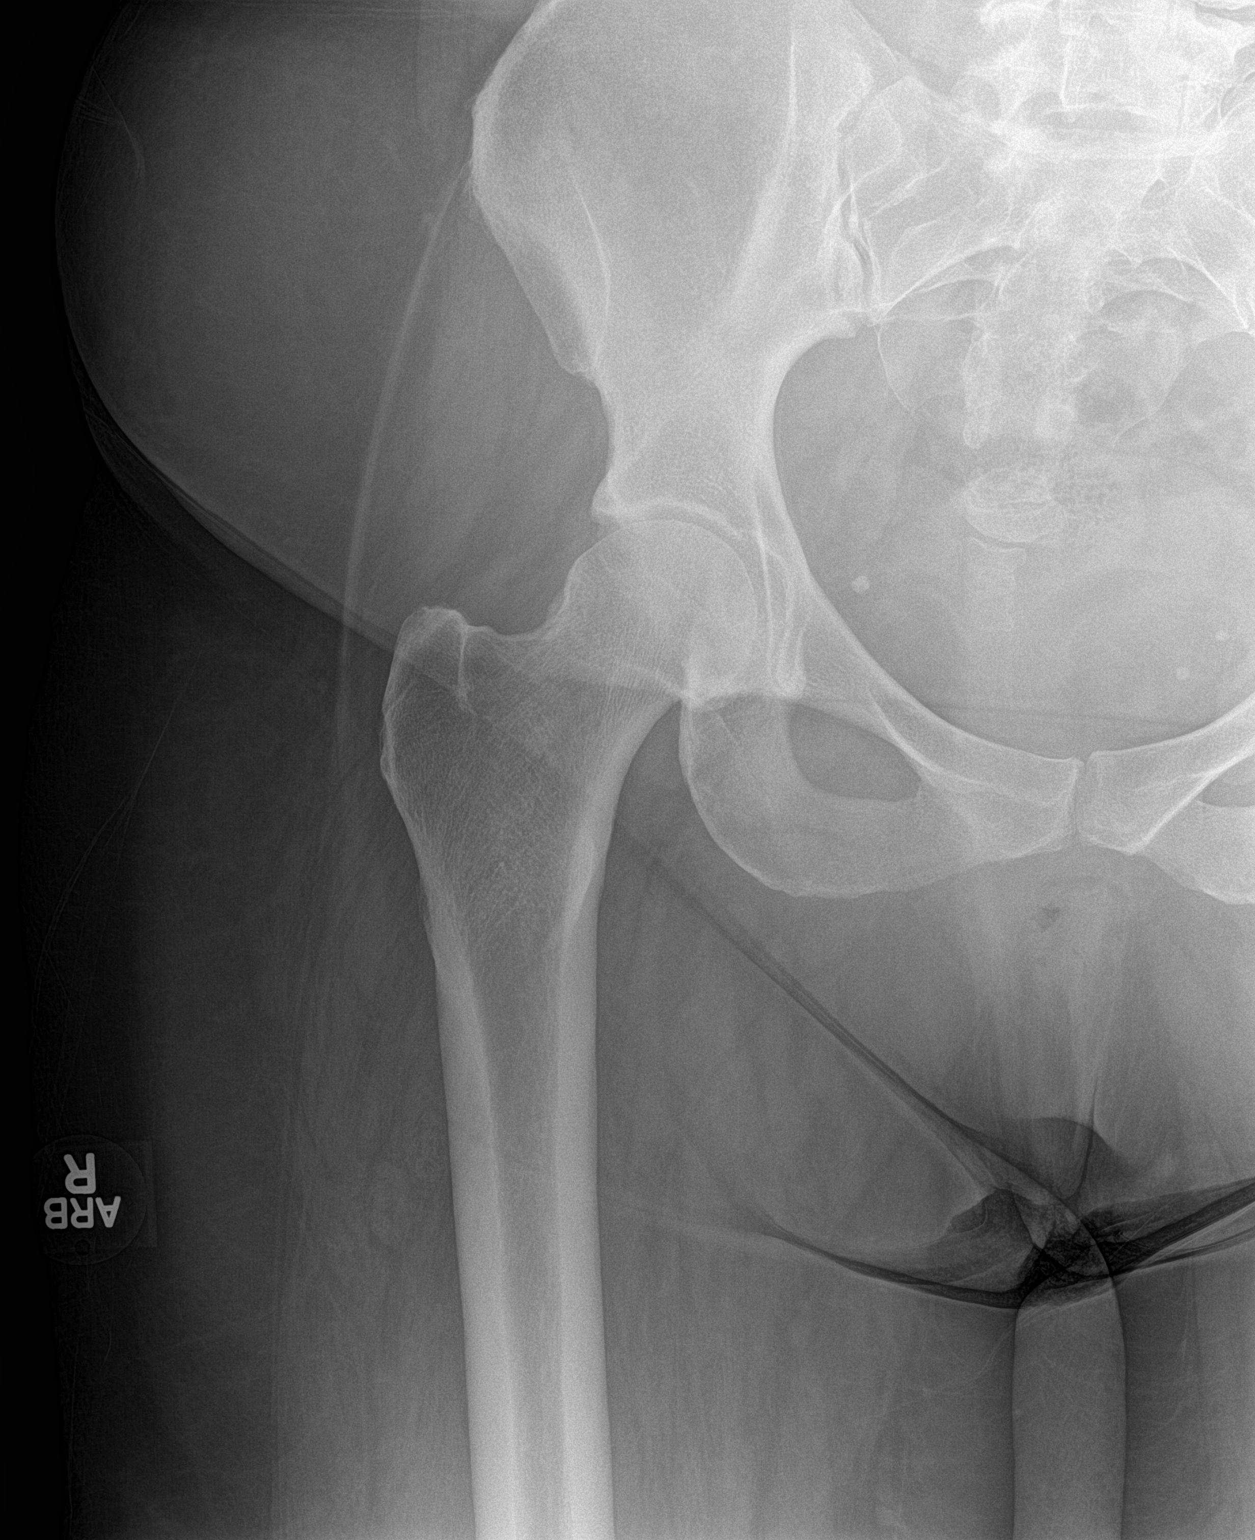
[im 3/3]
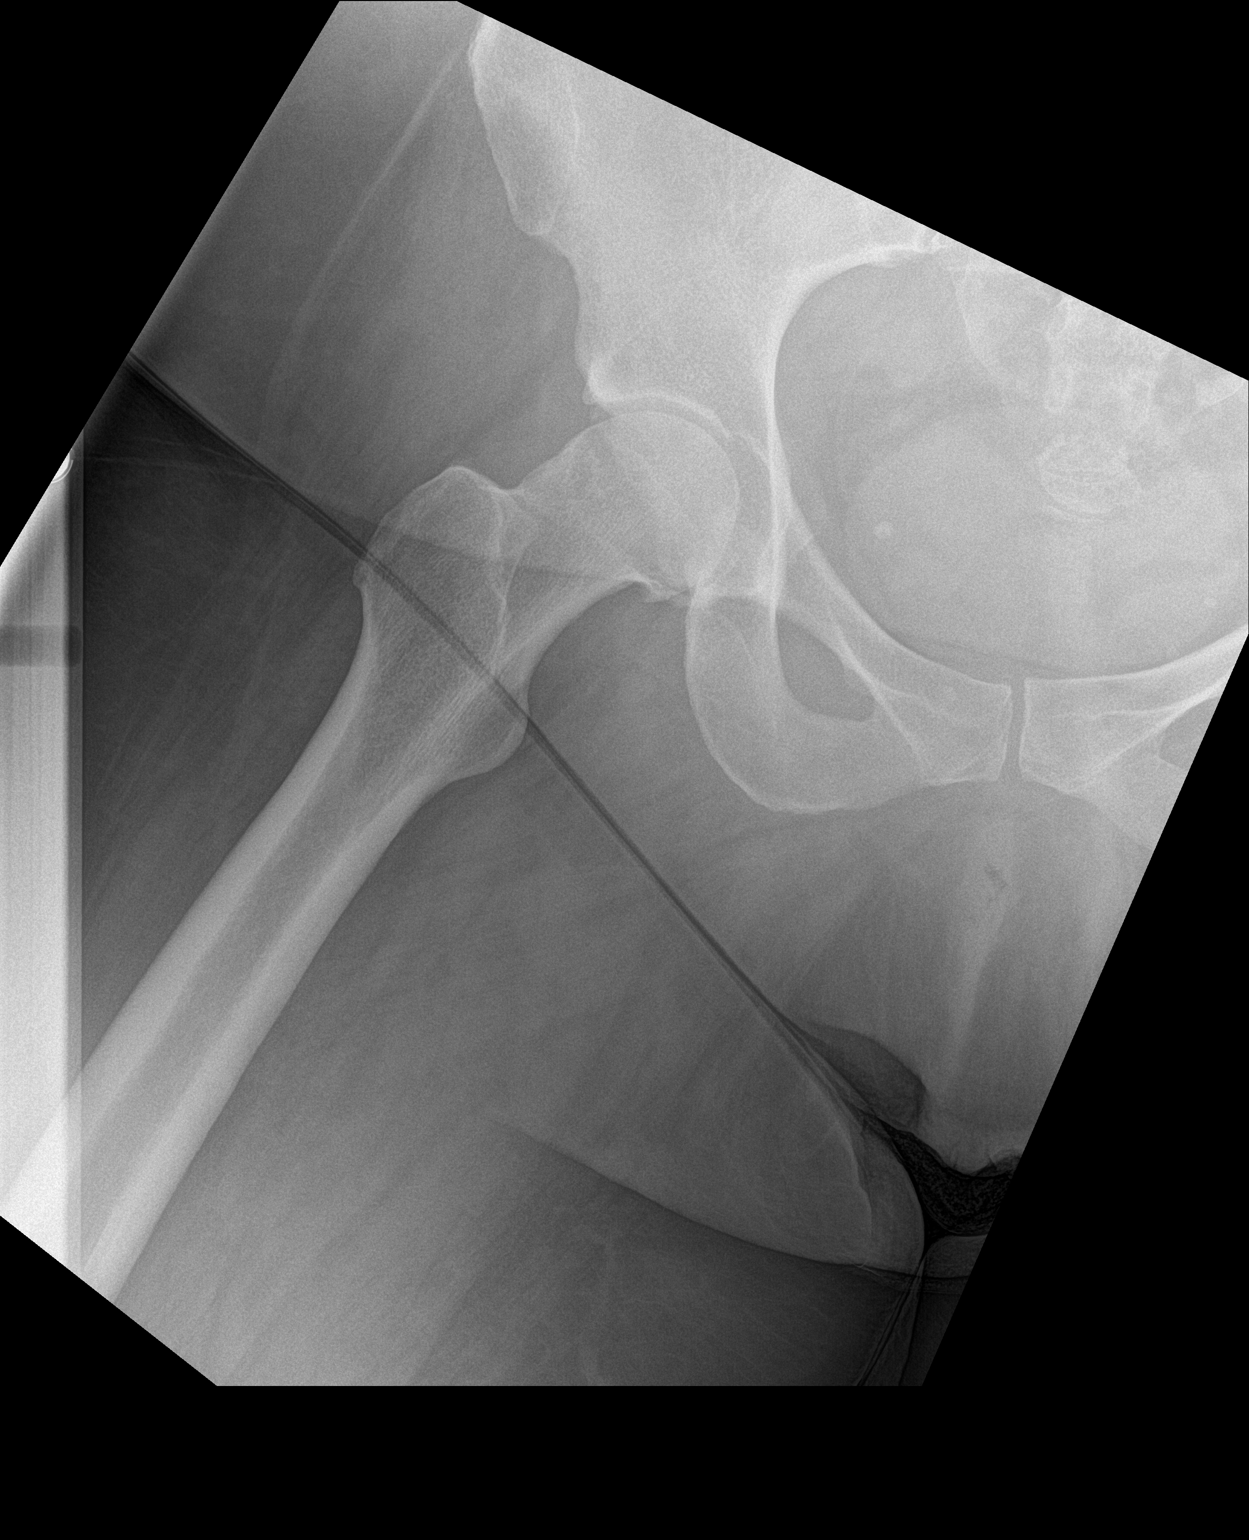

[3 of 3 positions shown; findings below may reference images not displayed]

FINDINGS: No acute bony or joint abnormality is identified. Mild degenerative
change is present about the hips. Lower lumbar spondylosis is also
noted.
IMPRESSION: No acute abnormality.

## 2014-07-20 IMAGING — CR DG KNEE COMPLETE 4+V*R*
1 series · 4 of 4 positions shown · non-contrast
Comparison: [DATE]

CLINICAL DATA: Fell 1 week ago onto RIGHT side, worsening pain at
RIGHT hip and RIGHT knee, no prior injuries

EXAM:
RIGHT KNEE - COMPLETE 4+ VIEW

[Series 1: dxr knee rt comp with obliques · 0.14mm/px · 4 of 4 slices shown]
[im 1/4]
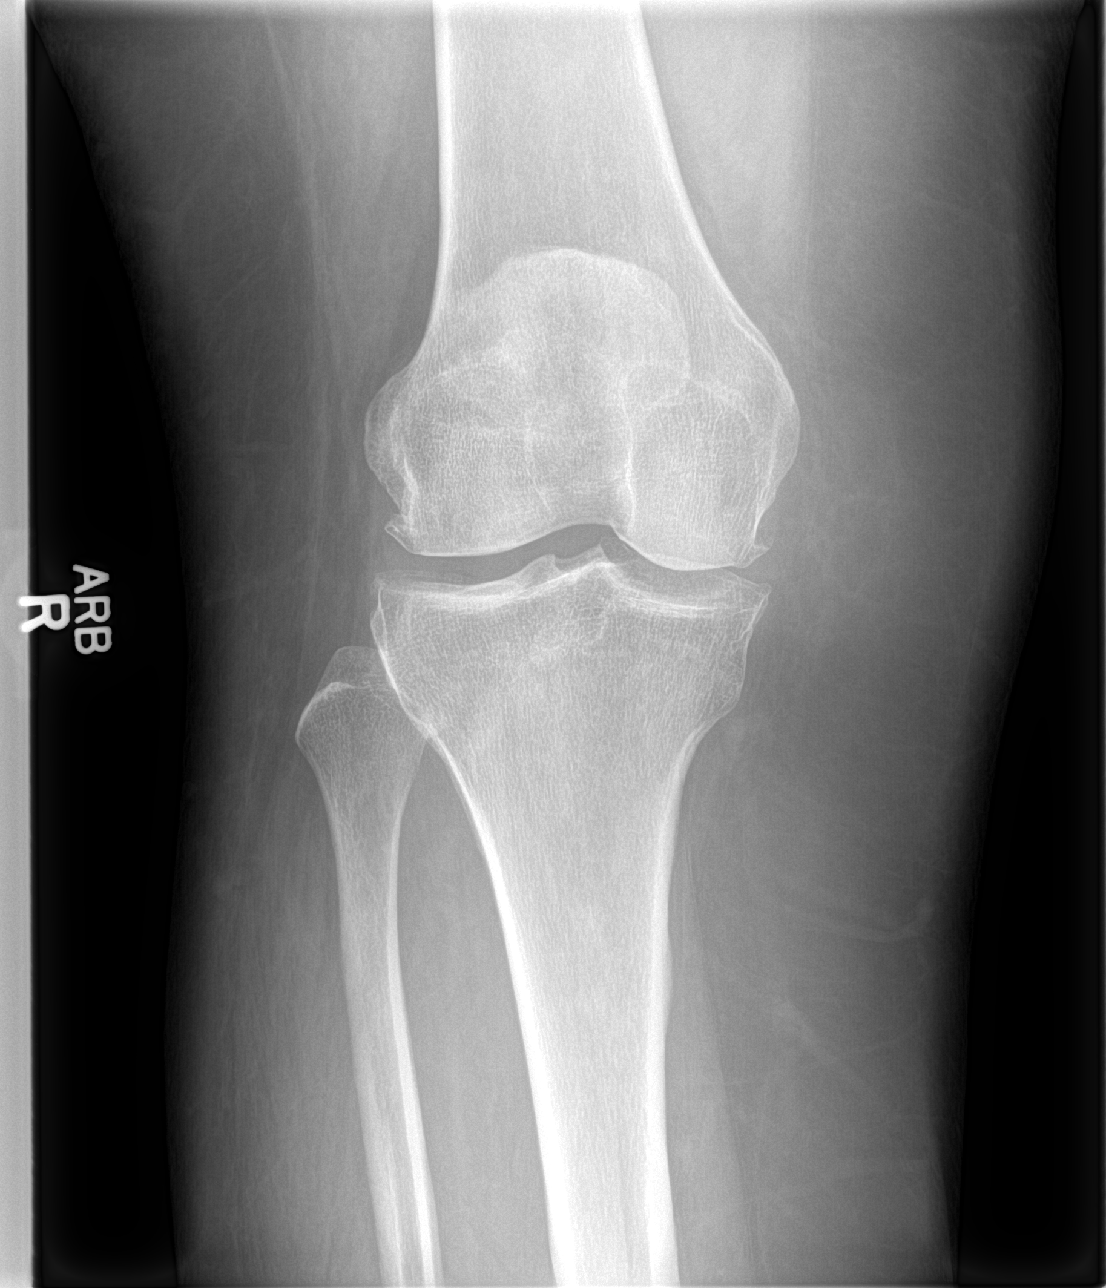
[im 2/4]
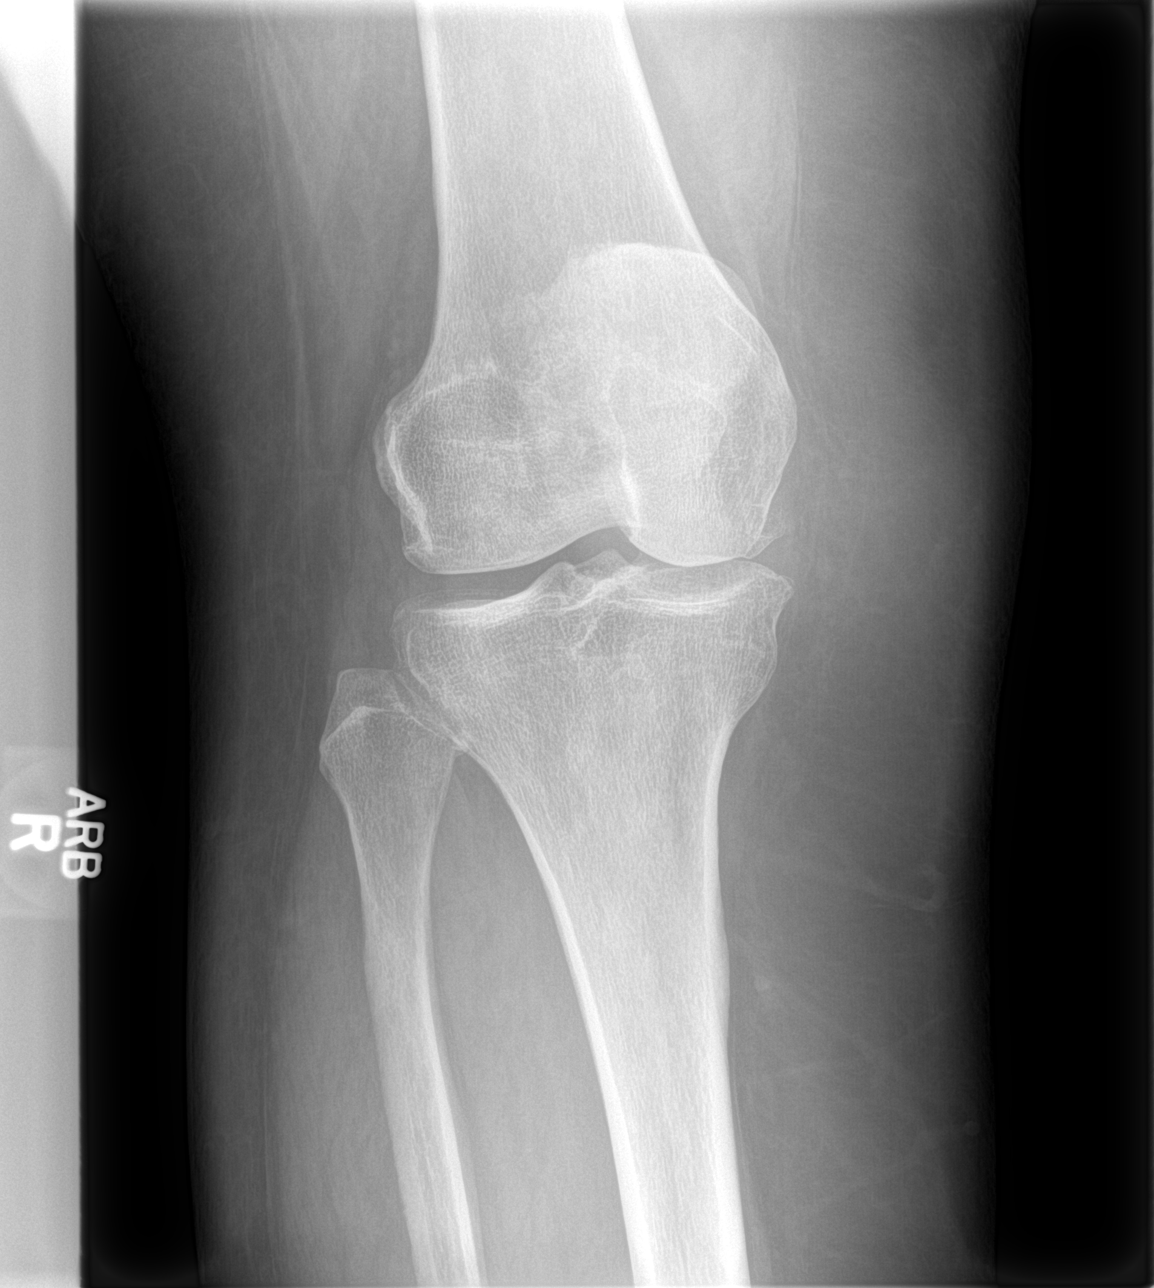
[im 3/4]
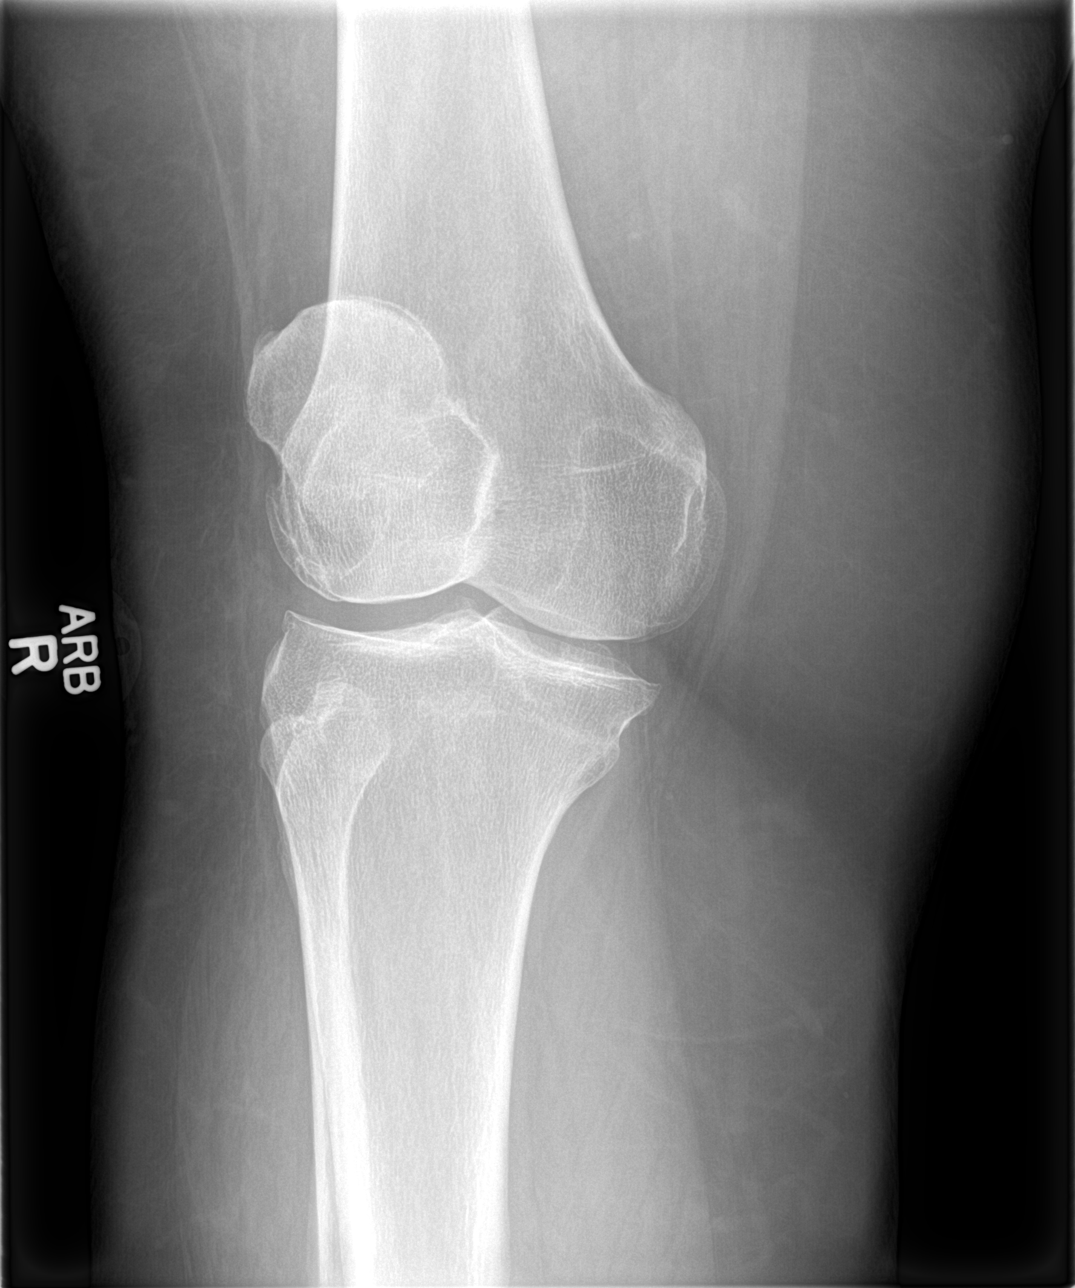
[im 4/4]
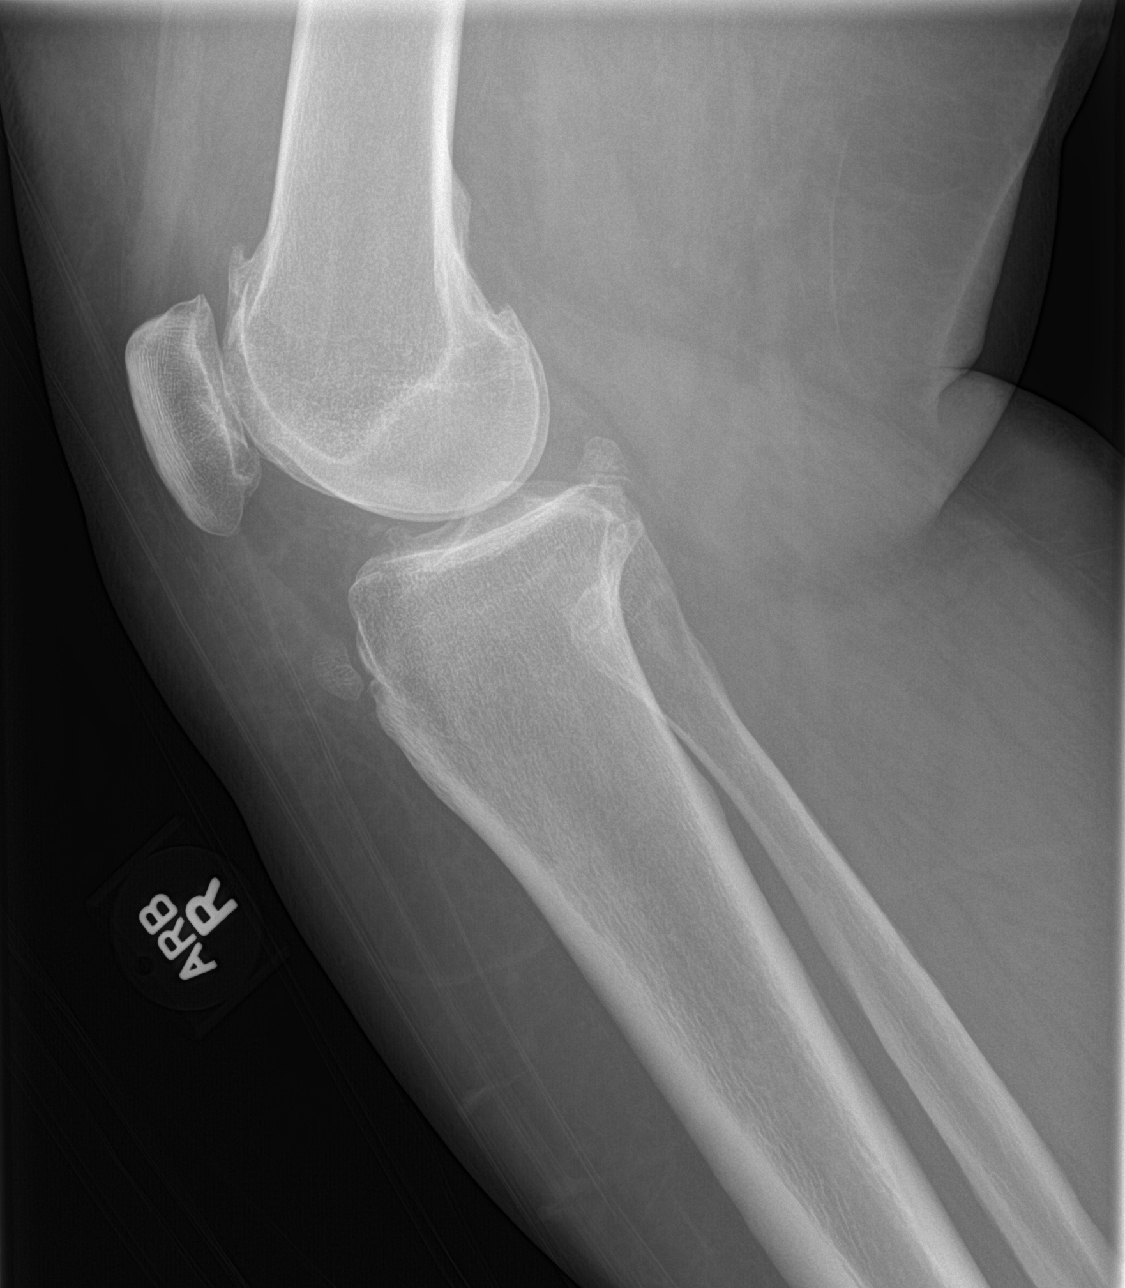

[4 of 4 positions shown; findings below may reference images not displayed]

FINDINGS: Osseous demineralization.

Minimal joint space narrowing and marginal spur formation.

No acute fracture, dislocation or bone destruction.

No knee joint effusion.

Unfused ossicle at tibial tubercle again noted.
IMPRESSION: Mild degenerative changes.

No acute osseous abnormalities.

## 2014-09-09 ENCOUNTER — Emergency Department: Payer: Self-pay | Admitting: Emergency Medicine

## 2014-09-09 LAB — URINALYSIS, COMPLETE
BILIRUBIN, UR: NEGATIVE
Glucose,UR: NEGATIVE mg/dL (ref 0–75)
Ketone: NEGATIVE
Nitrite: NEGATIVE
PH: 5 (ref 4.5–8.0)
PROTEIN: NEGATIVE
RBC,UR: 197 /HPF (ref 0–5)
Specific Gravity: 1.019 (ref 1.003–1.030)
Squamous Epithelial: 2
WBC UR: 146 /HPF (ref 0–5)

## 2014-09-28 ENCOUNTER — Emergency Department: Payer: Self-pay | Admitting: Emergency Medicine

## 2014-09-28 LAB — BASIC METABOLIC PANEL
Anion Gap: 7 (ref 7–16)
BUN: 15 mg/dL (ref 7–18)
CO2: 24 mmol/L (ref 21–32)
Calcium, Total: 8.5 mg/dL (ref 8.5–10.1)
Chloride: 108 mmol/L — ABNORMAL HIGH (ref 98–107)
Creatinine: 0.86 mg/dL (ref 0.60–1.30)
EGFR (African American): >60
EGFR (Non-African Amer.): >60
Glucose: 96 mg/dL (ref 65–99)
OSMOLALITY: 278 (ref 275–301)
POTASSIUM: 3.9 mmol/L (ref 3.5–5.1)
Sodium: 139 mmol/L (ref 136–145)

## 2014-09-28 LAB — CBC WITH DIFFERENTIAL/PLATELET
Basophil #: 0.1 10*3/uL (ref 0.0–0.1)
Basophil %: 0.5 %
EOS PCT: 3.1 %
Eosinophil #: 0.3 10*3/uL (ref 0.0–0.7)
HCT: 44.1 % (ref 35.0–47.0)
HGB: 14.5 g/dL (ref 12.0–16.0)
LYMPHS ABS: 2.7 10*3/uL (ref 1.0–3.6)
LYMPHS PCT: 26.4 %
MCH: 28.5 pg (ref 26.0–34.0)
MCHC: 32.9 g/dL (ref 32.0–36.0)
MCV: 87 fL (ref 80–100)
MONOS PCT: 6.8 %
Monocyte #: 0.7 x10 3/mm (ref 0.2–0.9)
Neutrophil #: 6.5 10*3/uL (ref 1.4–6.5)
Neutrophil %: 63.2 %
PLATELETS: 260 10*3/uL (ref 150–440)
RBC: 5.1 10*6/uL (ref 3.80–5.20)
RDW: 13.5 % (ref 11.5–14.5)
WBC: 10.2 10*3/uL (ref 3.6–11.0)

## 2014-09-28 LAB — TROPONIN I

## 2014-12-14 DIAGNOSIS — G5601 Carpal tunnel syndrome, right upper limb: Secondary | ICD-10-CM

## 2014-12-14 HISTORY — DX: Carpal tunnel syndrome, right upper limb: G56.01

## 2015-04-17 ENCOUNTER — Emergency Department
Admission: EM | Admit: 2015-04-17 | Discharge: 2015-04-17 | Disposition: A | Discharge status: Home / Self Care | Payer: Medicaid Other | Attending: Emergency Medicine | Admitting: Emergency Medicine

## 2015-04-17 ENCOUNTER — Emergency Department: Payer: Medicaid Other

## 2015-04-17 ENCOUNTER — Other Ambulatory Visit: Payer: Self-pay

## 2015-04-17 ENCOUNTER — Emergency Department
Admission: EM | Admit: 2015-04-17 | Discharge: 2015-04-17 | Disposition: A | Discharge status: Home / Self Care | Payer: No Typology Code available for payment source | Attending: Emergency Medicine | Admitting: Emergency Medicine

## 2015-04-17 ENCOUNTER — Encounter: Payer: Self-pay | Admitting: Emergency Medicine

## 2015-04-17 ENCOUNTER — Emergency Department: Payer: No Typology Code available for payment source

## 2015-04-17 DIAGNOSIS — Y9289 Other specified places as the place of occurrence of the external cause: Secondary | ICD-10-CM | POA: Insufficient documentation

## 2015-04-17 DIAGNOSIS — S0083XA Contusion of other part of head, initial encounter: Secondary | ICD-10-CM | POA: Insufficient documentation

## 2015-04-17 DIAGNOSIS — S0990XA Unspecified injury of head, initial encounter: Secondary | ICD-10-CM

## 2015-04-17 DIAGNOSIS — W01198A Fall on same level from slipping, tripping and stumbling with subsequent striking against other object, initial encounter: Secondary | ICD-10-CM | POA: Insufficient documentation

## 2015-04-17 DIAGNOSIS — Y9389 Activity, other specified: Secondary | ICD-10-CM | POA: Insufficient documentation

## 2015-04-17 DIAGNOSIS — S161XXA Strain of muscle, fascia and tendon at neck level, initial encounter: Secondary | ICD-10-CM | POA: Diagnosis not present

## 2015-04-17 DIAGNOSIS — S3992XA Unspecified injury of lower back, initial encounter: Secondary | ICD-10-CM | POA: Diagnosis not present

## 2015-04-17 DIAGNOSIS — S4992XA Unspecified injury of left shoulder and upper arm, initial encounter: Secondary | ICD-10-CM | POA: Diagnosis not present

## 2015-04-17 DIAGNOSIS — I10 Essential (primary) hypertension: Secondary | ICD-10-CM | POA: Insufficient documentation

## 2015-04-17 DIAGNOSIS — Y998 Other external cause status: Secondary | ICD-10-CM | POA: Diagnosis not present

## 2015-04-17 DIAGNOSIS — S29002A Unspecified injury of muscle and tendon of back wall of thorax, initial encounter: Secondary | ICD-10-CM | POA: Insufficient documentation

## 2015-04-17 DIAGNOSIS — S199XXA Unspecified injury of neck, initial encounter: Secondary | ICD-10-CM | POA: Diagnosis present

## 2015-04-17 DIAGNOSIS — T148XXA Other injury of unspecified body region, initial encounter: Secondary | ICD-10-CM

## 2015-04-17 DIAGNOSIS — S0012XA Contusion of left eyelid and periocular area, initial encounter: Secondary | ICD-10-CM | POA: Insufficient documentation

## 2015-04-17 DIAGNOSIS — M545 Low back pain: Secondary | ICD-10-CM

## 2015-04-17 DIAGNOSIS — Y92410 Unspecified street and highway as the place of occurrence of the external cause: Secondary | ICD-10-CM | POA: Diagnosis not present

## 2015-04-17 DIAGNOSIS — W19XXXA Unspecified fall, initial encounter: Secondary | ICD-10-CM

## 2015-04-17 DIAGNOSIS — M546 Pain in thoracic spine: Secondary | ICD-10-CM

## 2015-04-17 HISTORY — DX: Essential (primary) hypertension: I10

## 2015-04-17 IMAGING — CT CT HEAD W/O CM
3 of 4 series · 16 of 47 positions shown, 19 images · non-contrast
Comparison: None.

CLINICAL DATA: Recent fall with facial injury and periorbital
ecchymosis

EXAM:
CT HEAD WITHOUT CONTRAST
CT MAXILLOFACIAL WITHOUT CONTRAST
TECHNIQUE: Multidetector CT imaging of the head and maxillofacial structures
were performed using the standard protocol without intravenous
contrast. Multiplanar CT image reconstructions of the maxillofacial
structures were also generated.

[Series 5: max soft · axial · 0.35mm/px · z∈[-88,+40]mm · 10 of 76 slices shown, 13 images]
[im 8/76  brain]
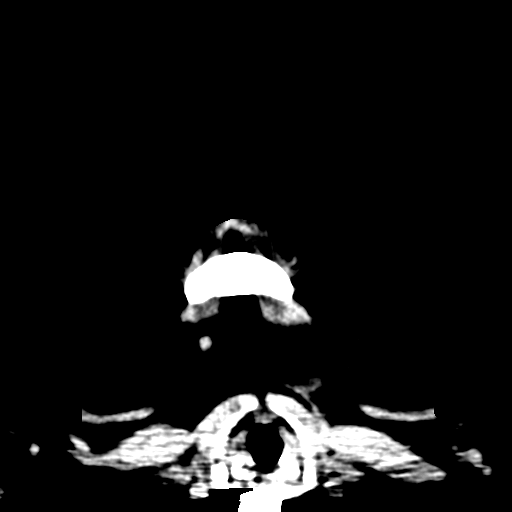
[im 8/76  bone]
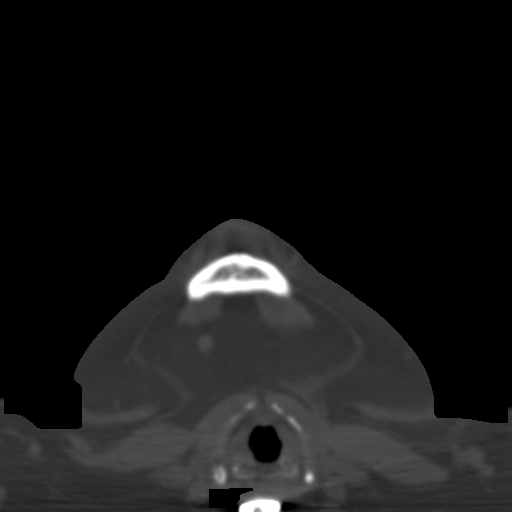
[im 15/76  brain]
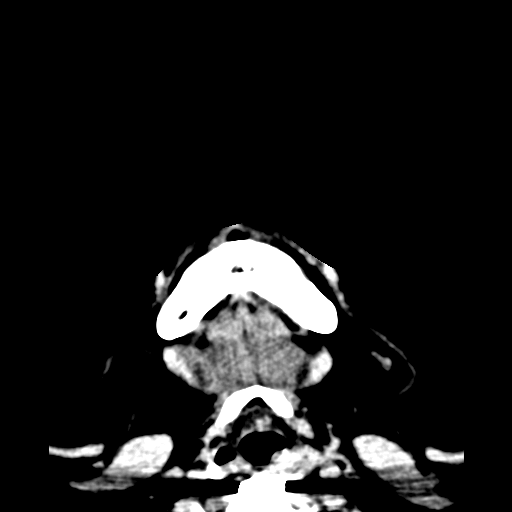
[im 22/76  brain]
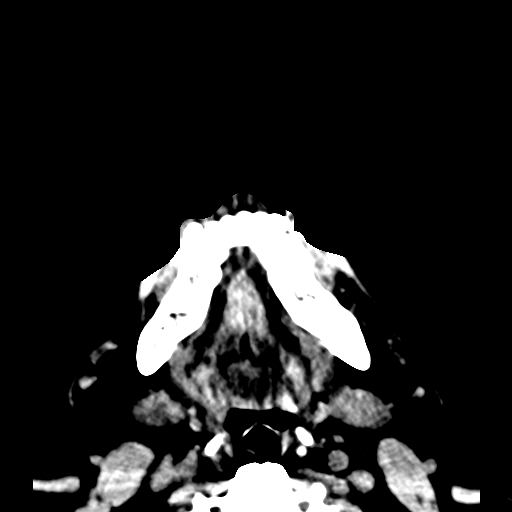
[im 29/76  brain]
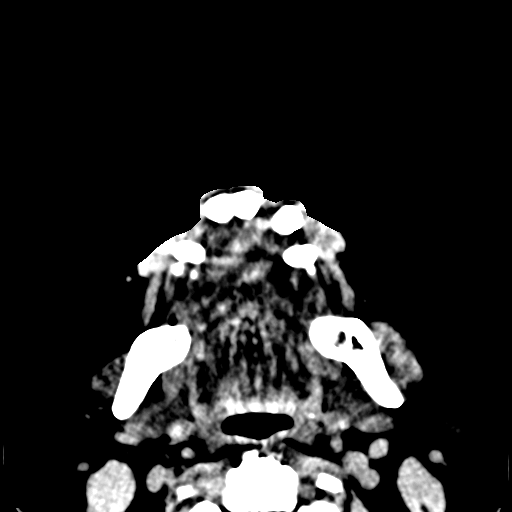
[im 36/76  brain]
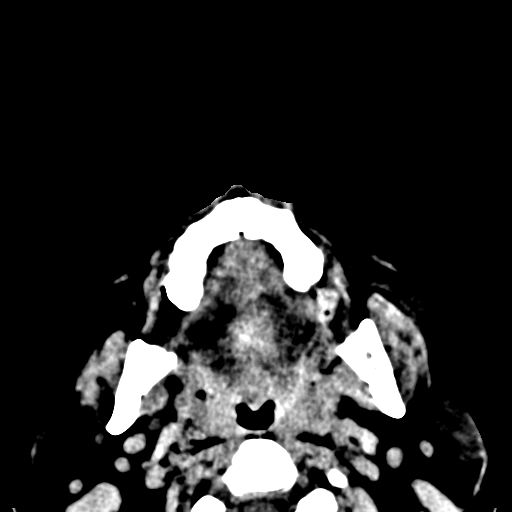
[im 36/76  bone]
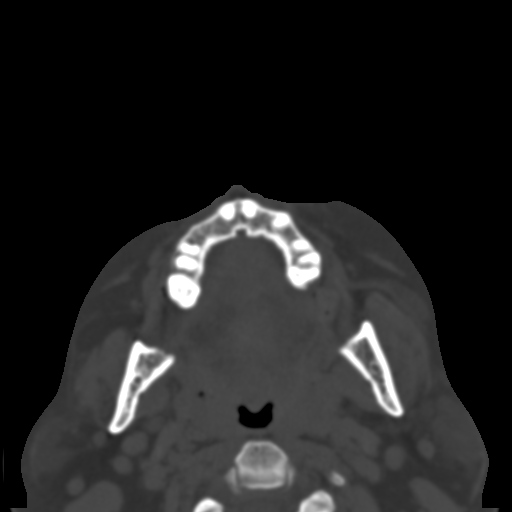
[im 43/76  brain]
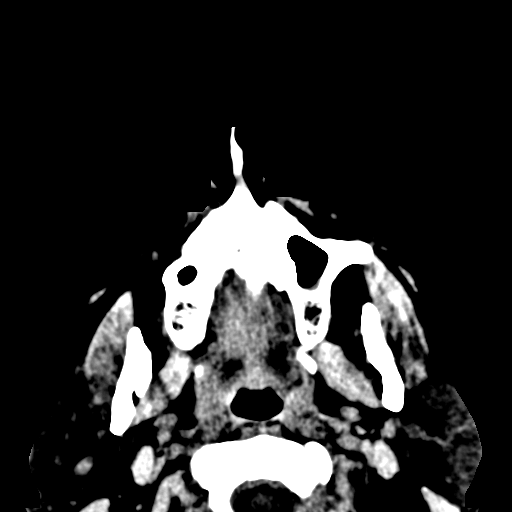
[im 51/76  brain]
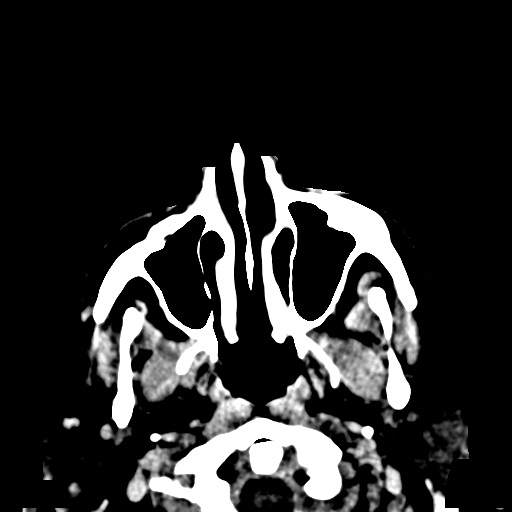
[im 58/76  brain]
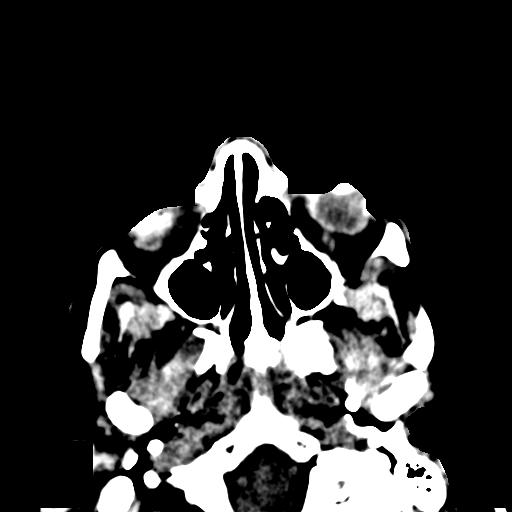
[im 65/76  brain]
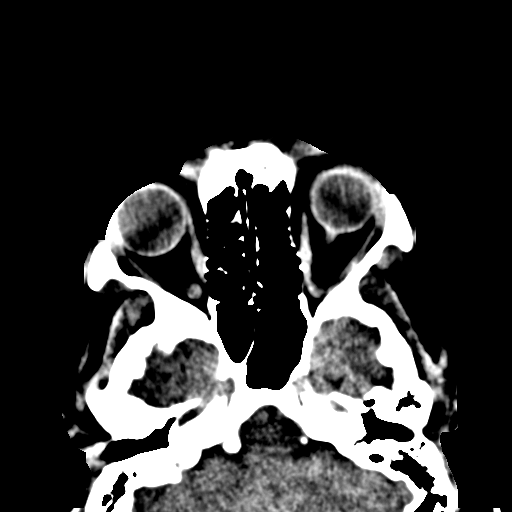
[im 65/76  bone]
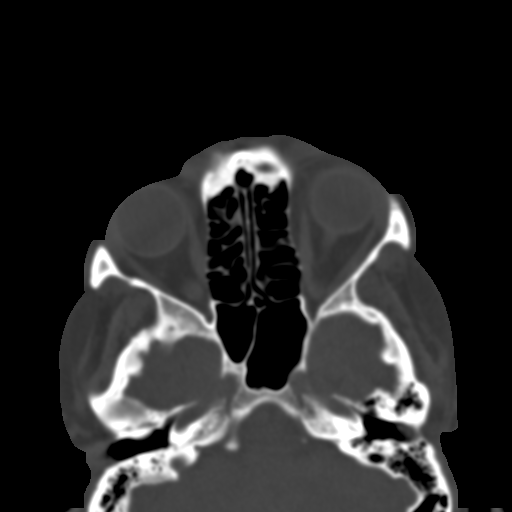
[im 72/76  brain]
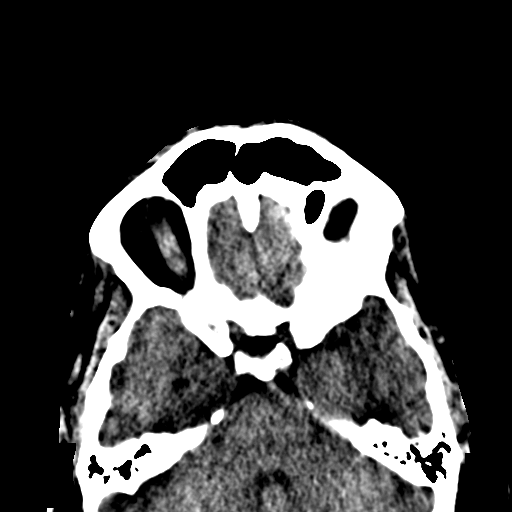

[Series 7: coronal soft · coronal · 0.36mm/px · 3 of 56 slices shown]
[im 19/56  brain]
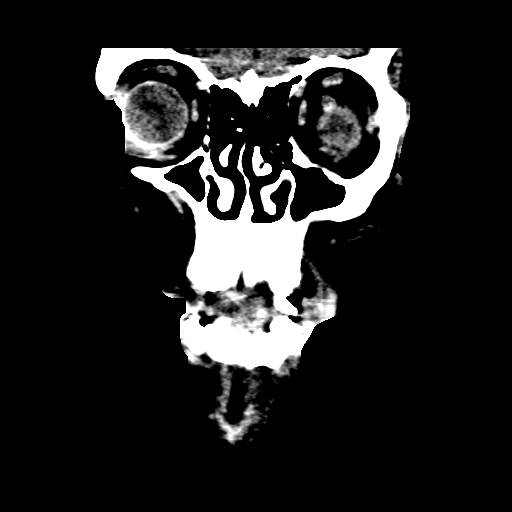
[im 25/56  brain]
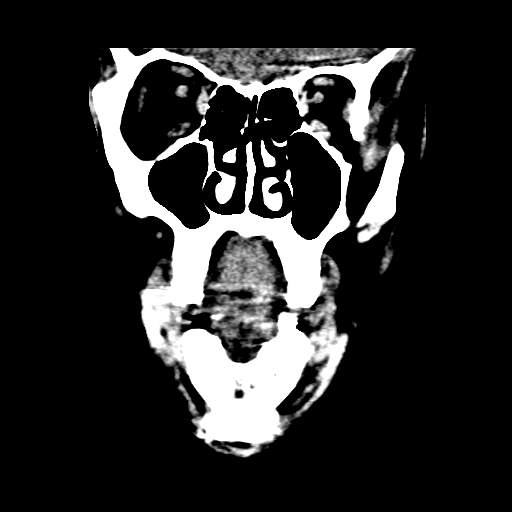
[im 31/56  brain]
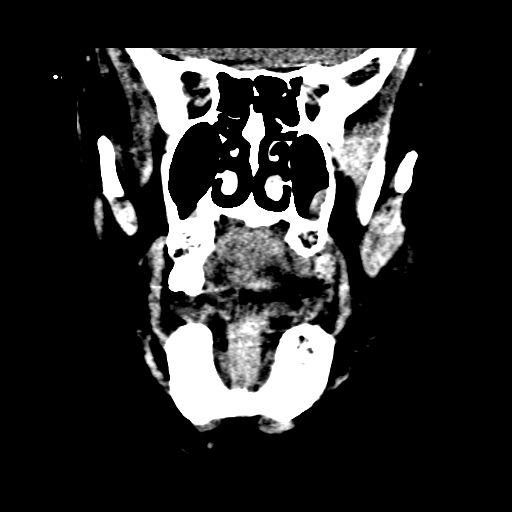

[Series 8: sagittal soft · sagittal · 0.36mm/px · 3 of 81 slices shown]
[im 27/81  brain]
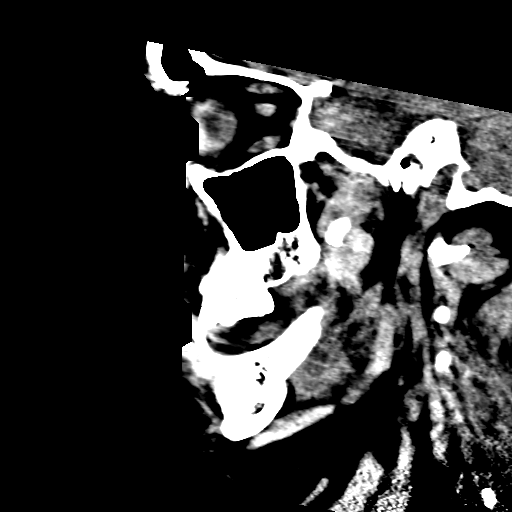
[im 41/81  brain]
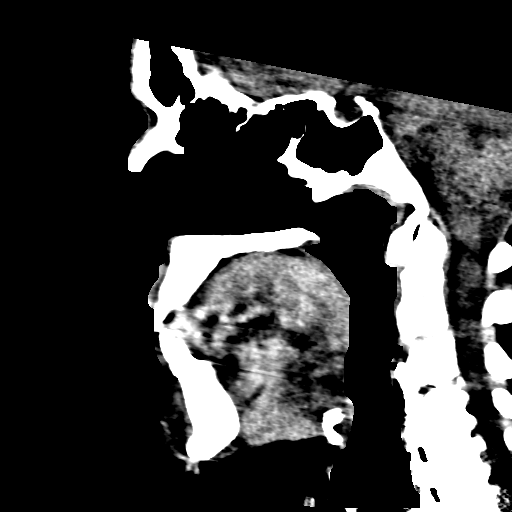
[im 54/81  brain]
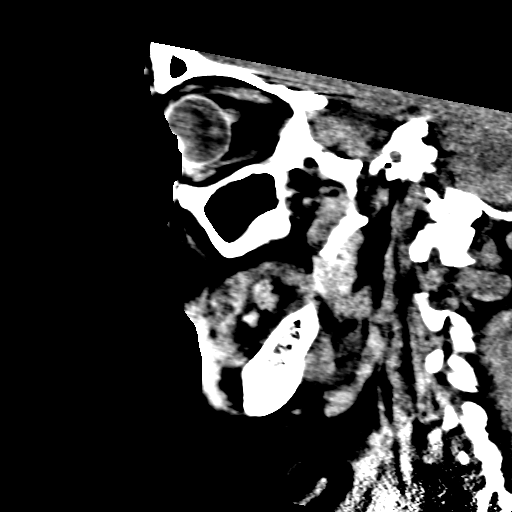

[16 of 47 positions shown; findings below may reference images not displayed]

FINDINGS: CT HEAD FINDINGS

Bony calvarium is intact. No gross soft tissue abnormality is noted.
No findings to suggest acute hemorrhage, acute infarction or
space-occupying mass lesion are noted.

CT MAXILLOFACIAL FINDINGS

A few mucosal retention cysts are noted within the maxillary antra.
No acute fracture or dislocation is noted. Paranasal sinuses are
otherwise within normal limits. Very mild left periorbital swelling
is seen. No focal hematoma is noted.
IMPRESSION: CT of the head:  No acute intracranial abnormality noted.

CT of the maxillofacial bones: No acute bony abnormality is noted.
Mild left periorbital swelling is seen. No focal hematoma is seen.

## 2015-04-17 IMAGING — CR DG HUMERUS 2V *L*
1 series · 3 of 3 positions shown · non-contrast
Comparison: None.

CLINICAL DATA: Fall, left arm pain

EXAM:
LEFT HUMERUS - 2+ VIEW

[Series 1: dg humerus left · 0.14mm/px · 3 of 3 slices shown]
[im 1/3]
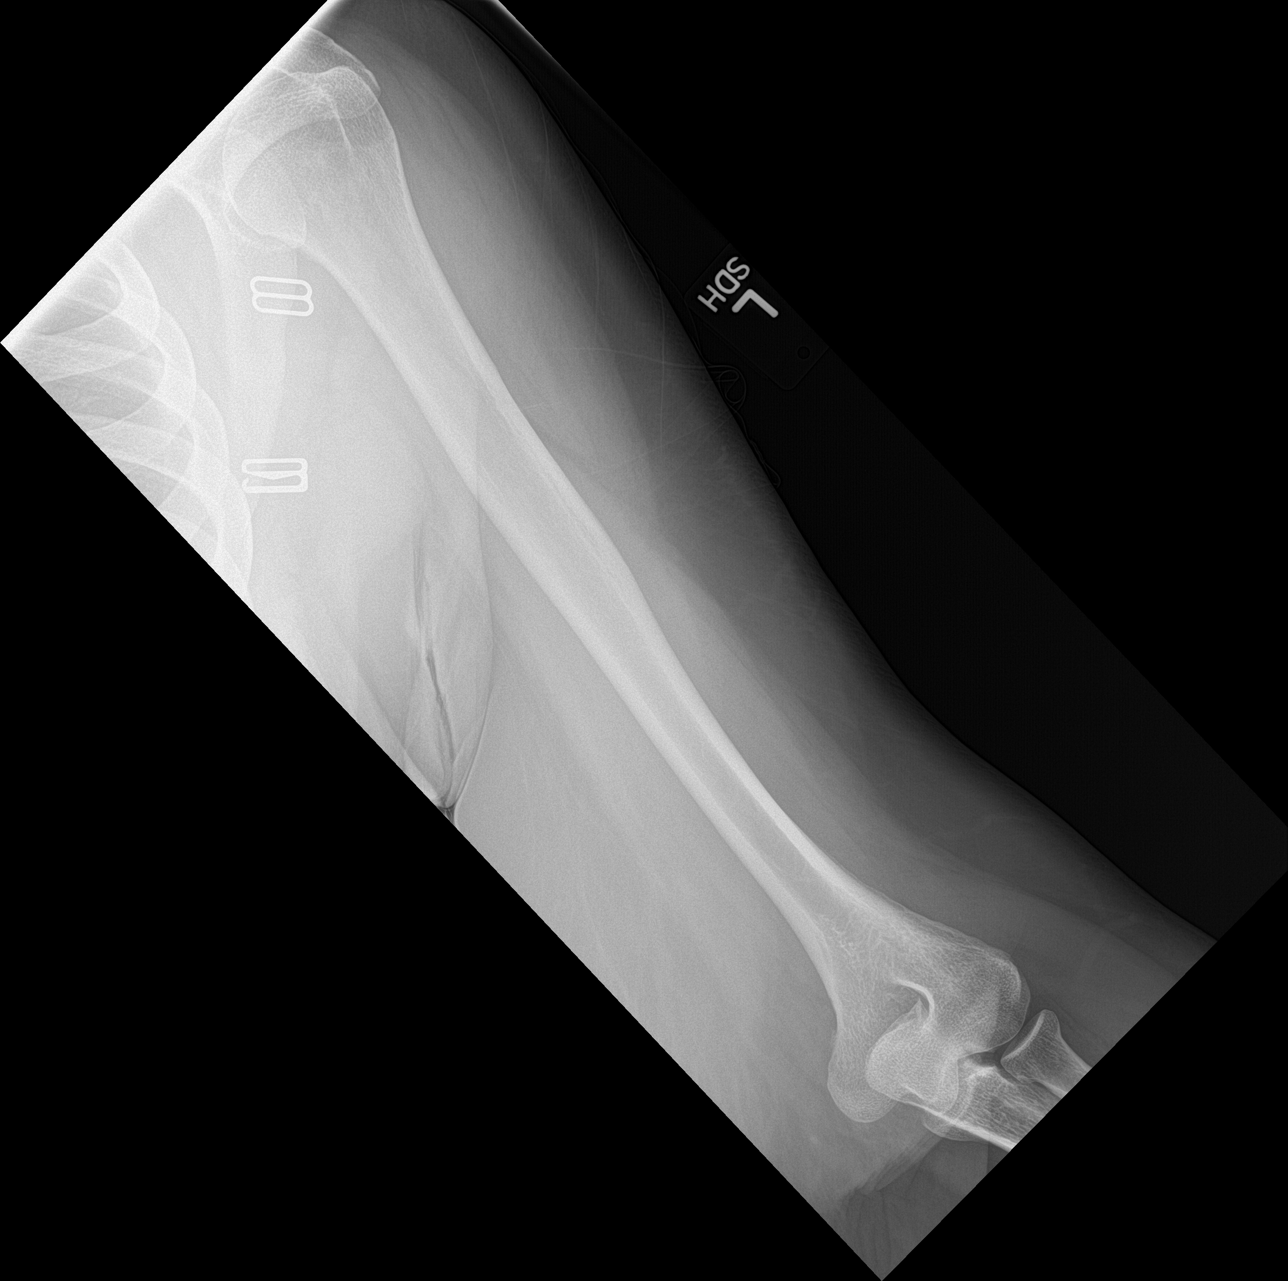
[im 2/3]
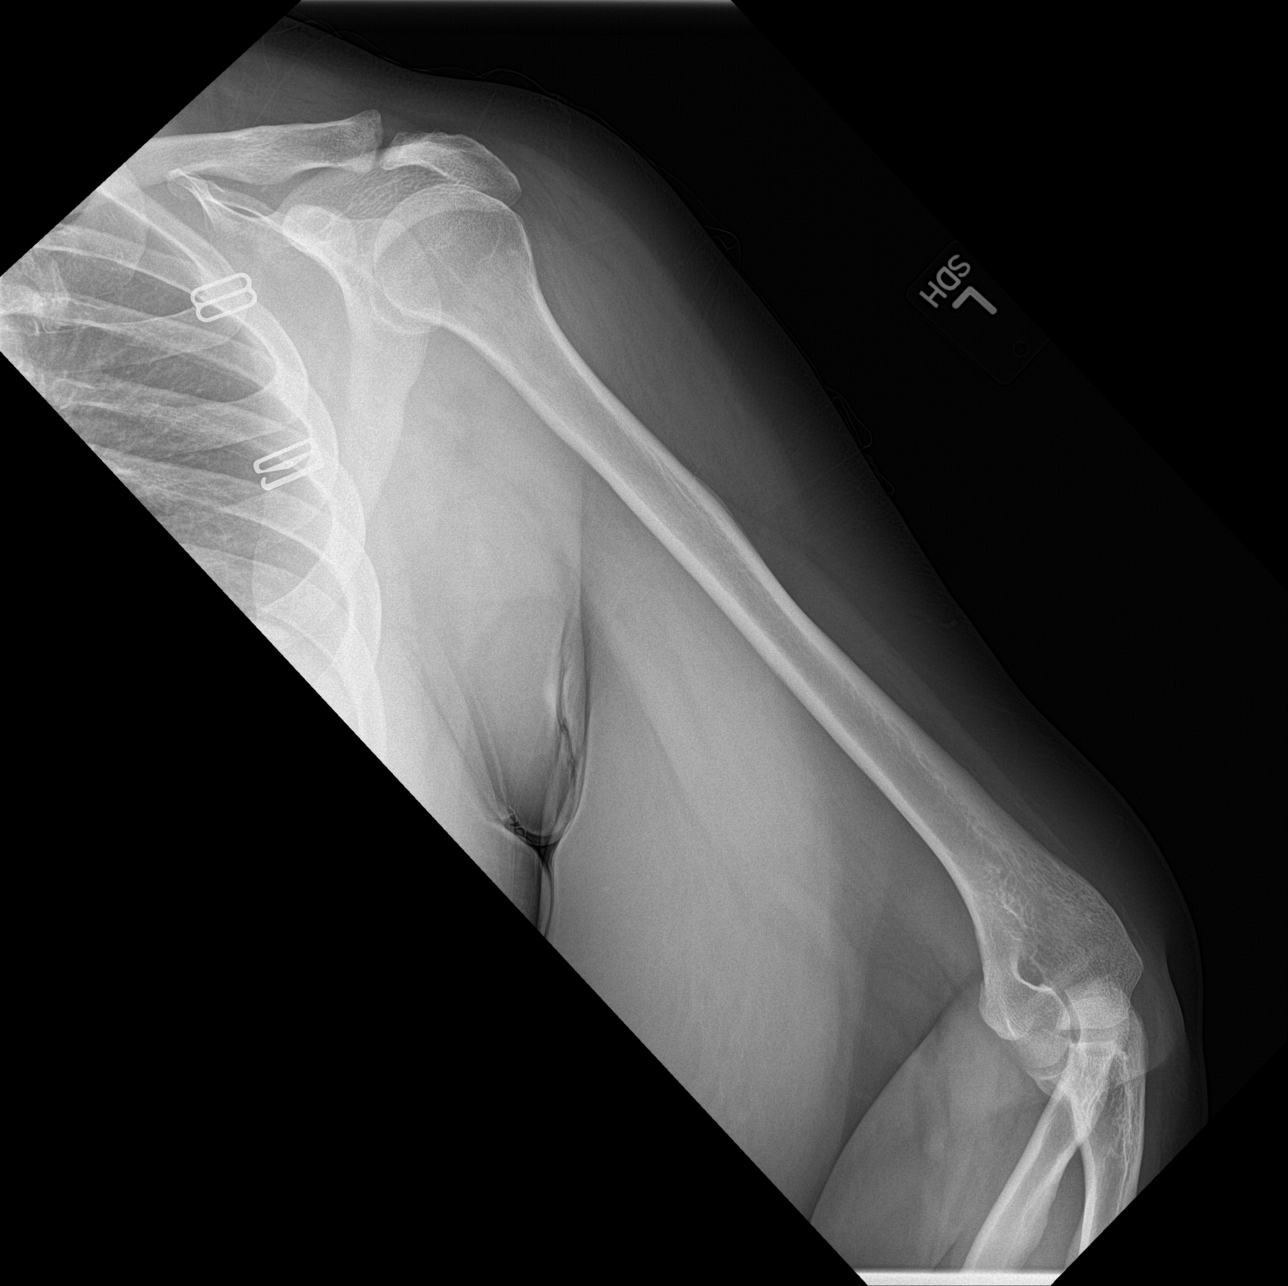
[im 3/3]
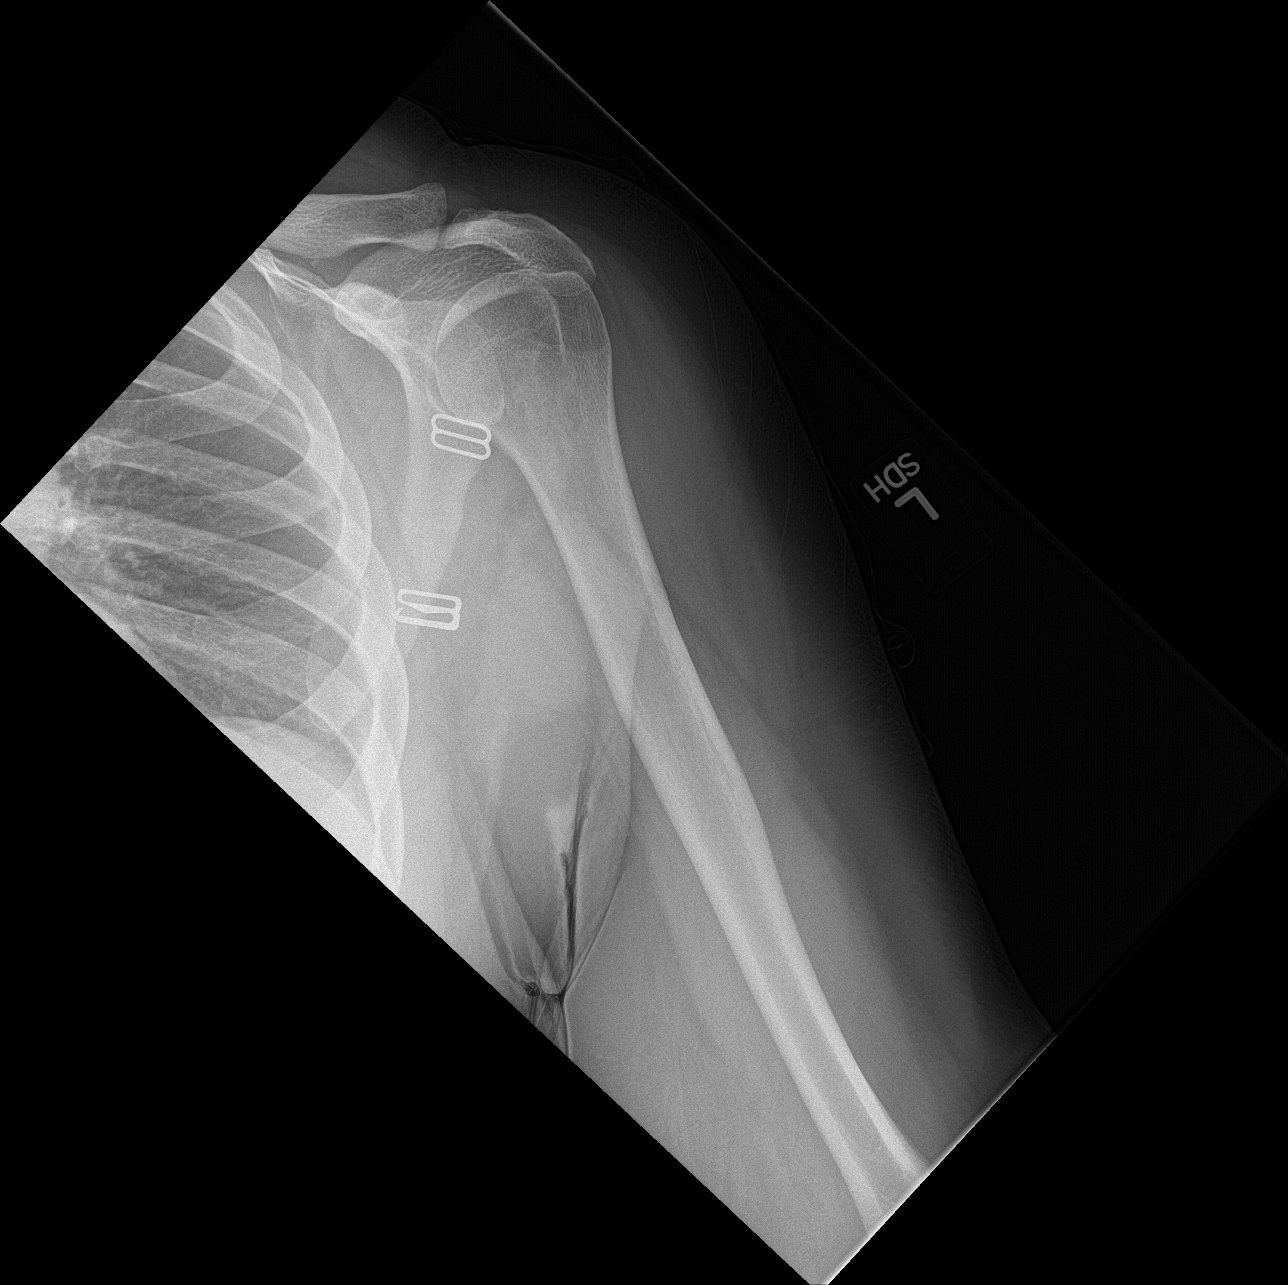

[3 of 3 positions shown; findings below may reference images not displayed]

FINDINGS: No fracture or dislocation is seen.

The visualized soft tissues are unremarkable.

Visualized left lung is clear.
IMPRESSION: No fracture or dislocation is seen.
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
## 2015-04-17 IMAGING — CR DG CERVICAL SPINE COMPLETE 4+V
1 series · 6 of 6 positions shown · non-contrast
Comparison: Cervical spine CT dated [DATE]

CLINICAL DATA: Fall, hit left face on window [place]

EXAM:
CERVICAL SPINE  4+ VIEWS

[Series 1: dg cervical spine complete · 0.14mm/px · 6 of 6 slices shown]
[im 1/6]
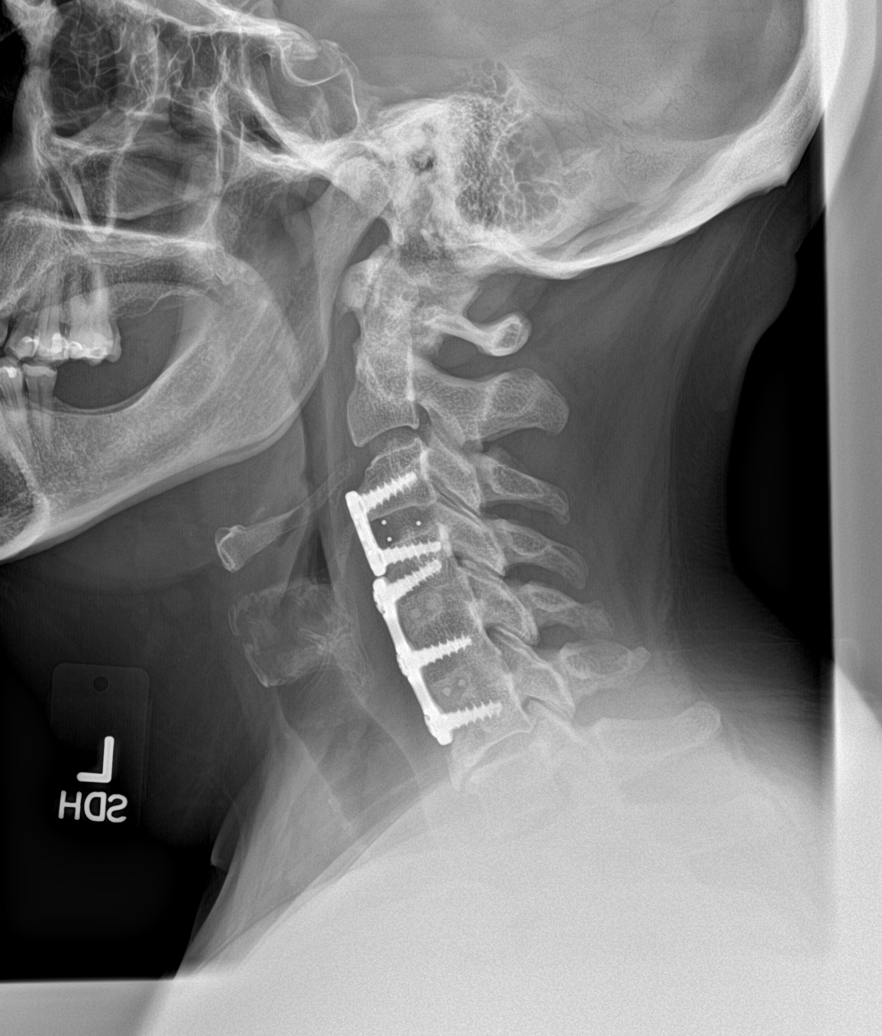
[im 2/6]
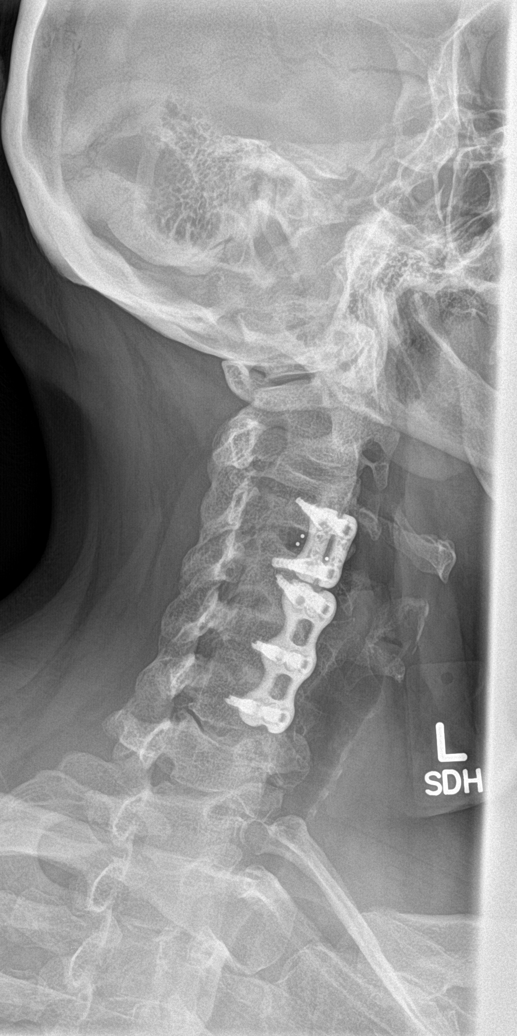
[im 3/6]
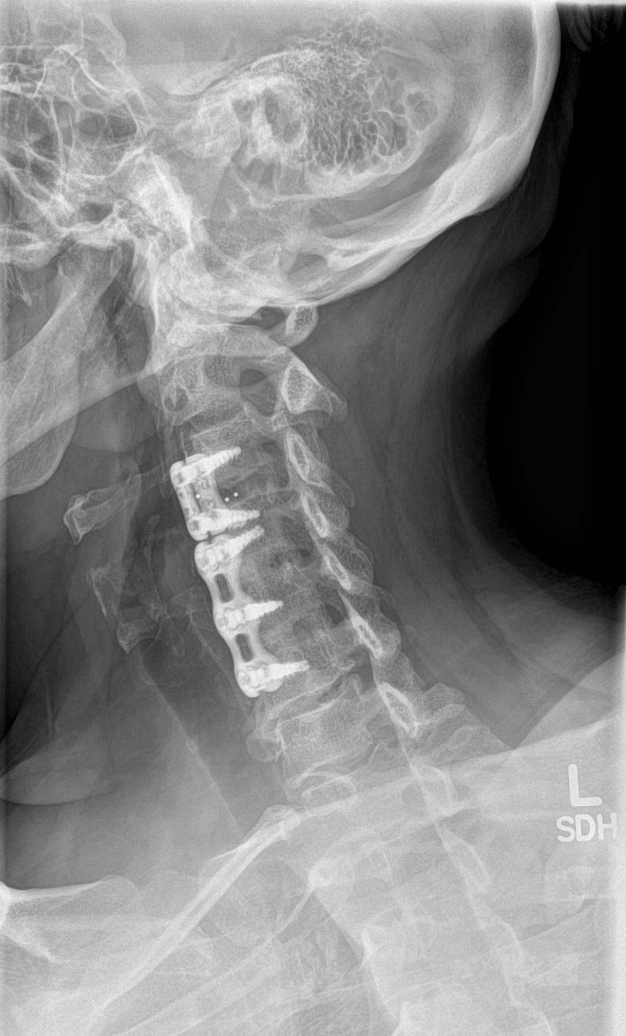
[im 4/6]
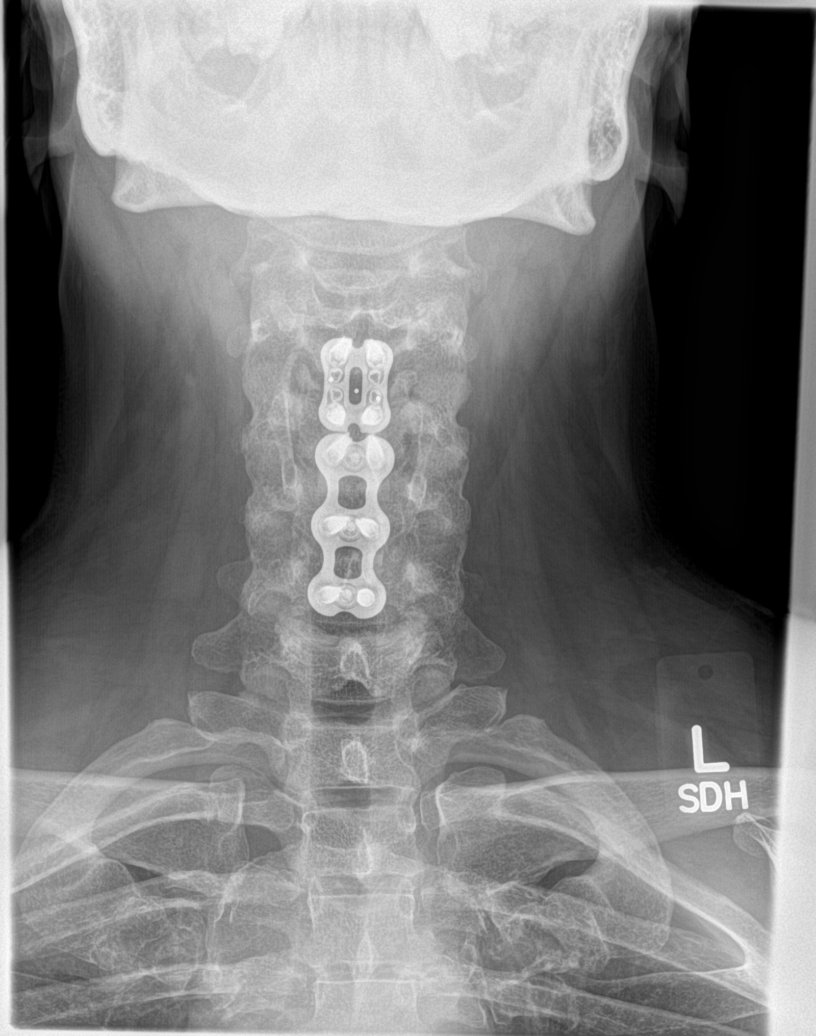
[im 5/6]
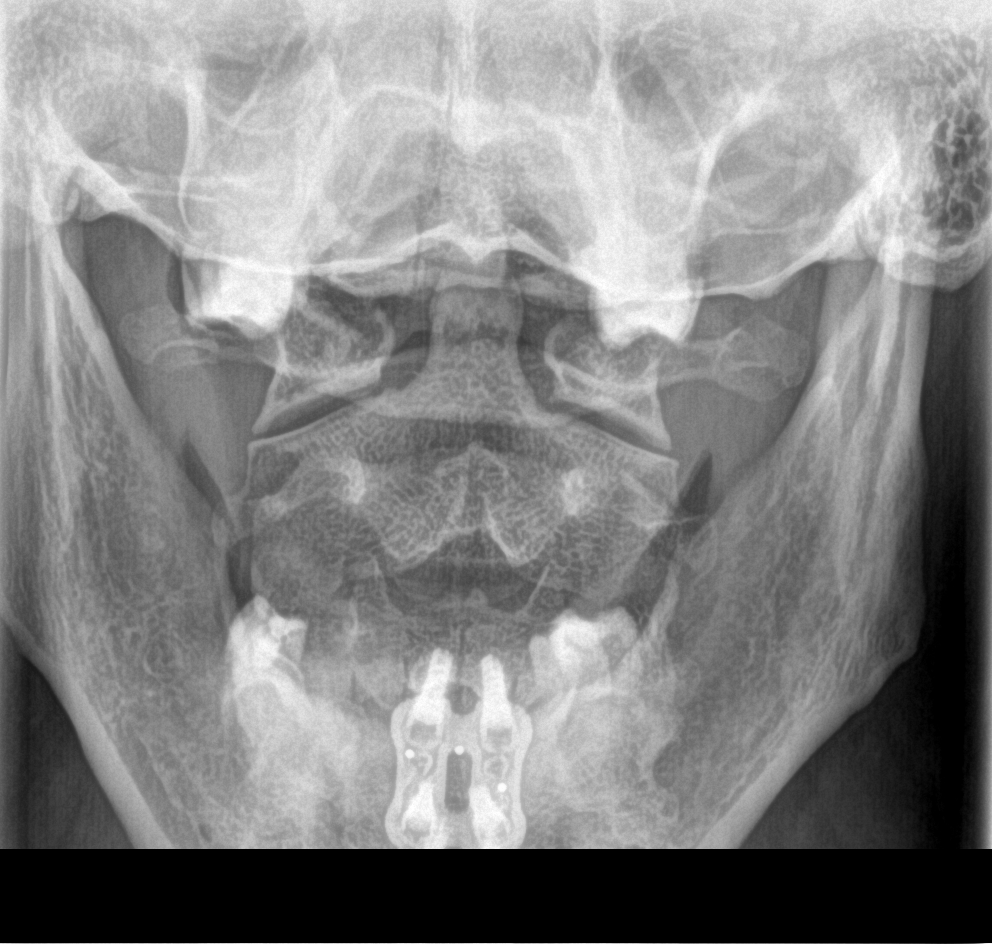
[im 6/6]
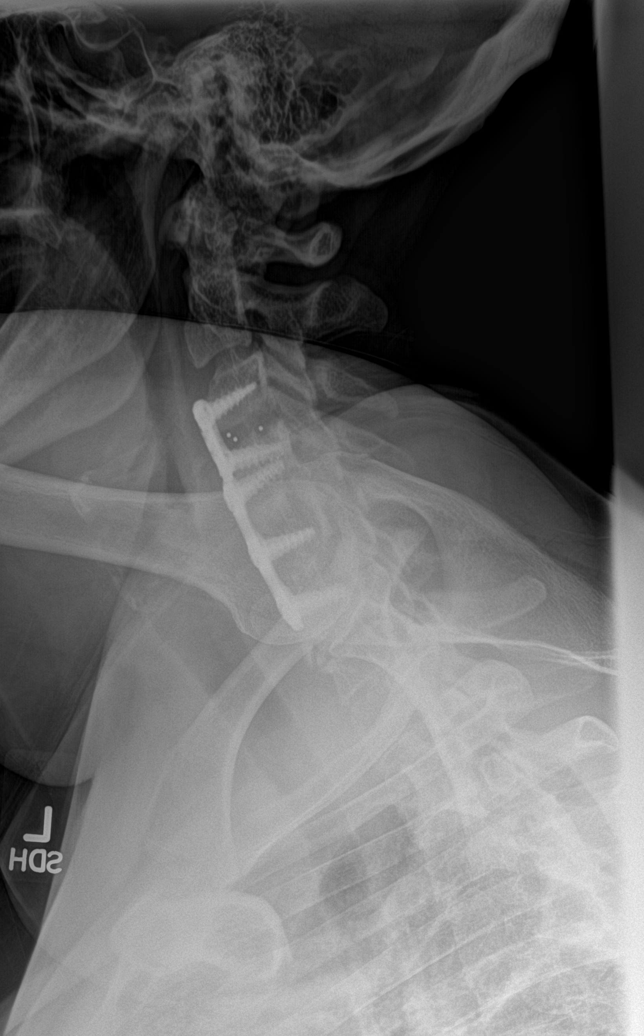

[6 of 6 positions shown; findings below may reference images not displayed]

FINDINGS: Normal cervical lordosis.

No evidence of fracture or dislocation. Vertebral body heights and
intervertebral disc spaces are maintained. Dens appears intact.

No prevertebral soft tissue swelling.

Status post ACDF at C3-4 and C4-6. No evidence of hardware
complication.

Mild degenerative changes at C6-7.

Visualized lung apices are clear.
IMPRESSION: No fracture or dislocation is seen.

Status post C3-6 ACDF.  No evidence of hardware complication.

Mild degenerative changes.
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
## 2015-04-17 MED ORDER — ACETAMINOPHEN-CODEINE #3 300-30 MG PO TABS: 1.0000 | ORAL_TABLET | Freq: Three times a day (TID) | ORAL | Status: DC | PRN

## 2015-04-17 MED ORDER — IBUPROFEN 800 MG PO TABS: 800.0000 mg | ORAL_TABLET | Freq: Three times a day (TID) | ORAL | Status: DC | PRN

## 2015-04-17 MED ORDER — HYDROCODONE-ACETAMINOPHEN 5-325 MG PO TABS
2.0000 | ORAL_TABLET | Freq: Once | ORAL | Status: AC
  Administered 2015-04-17: 2 via ORAL
  Filled 2015-04-17: qty 2

## 2015-04-17 NOTE — ED Provider Notes (Signed)
[facility] Emergency Department Provider Note  ____________________________________________  Time seen: Approximately 3:55 PM  I have reviewed the triage vital signs and the nursing notes.   HISTORY  Chief Complaint Fall   HPI Mary Chambers is a 44 y.o. female presents the ER for the complaint of left facial pain post fall. Patient states that approximately 5 AM this morning she got up to go to the bathroom and states that she tripped over her bathroom rug and fell forward. Patient states that she hit left face on the windowsill. She states that she was able to partially catch herself on the toilet seat. Patient denies loss of consciousness. Patient reports that she was able to get herself up. Patient states that she got herself up and then went back to bed. Patient states that she presents now she has continued pain.  Patient reports left face pain at 6 out of 10 and aching. Patient also reports left frontal headache, neck pain and left arm tenderness to palpation. She denies left arm pain unless palpation or with movement. Denies resting arm pain. Patient states that she hit her left arm when she fell forward. Patient denies any pain or discomfort prior to fall. Denies any recent chest pain, shortness of breath, abdominal pain, weakness or dizziness. Patient states that she fell because she tripped. Patient states that all of her discomforts are from the direct impact from fall.  She reports that she had cervical fusion in April 2016 and has had chronic neck pain and states that she wanted sure her neck was okay post fall. Denies numbness or tingling sensation. Denies pain radiation. Denies weakness in hands or hand grip.   Past Medical History  Diagnosis Date  . Hypertension   Chronic neck and back pain   There are no active problems to display for this patient.   Past Surgical History  Procedure Laterality Date  . Cholecystectomy    . Abdominal  hysterectomy    . Tubal ligation    . Lithotripsy    . Neck fusion      Current Outpatient Rx  Name  Route  Sig  Dispense  Refill                          Allergies Percocet and Septra  No family history on file.  Social History History  Substance Use Topics  . Smoking status: Never Smoker   . Smokeless tobacco: Not on file  . Alcohol Use: No    Review of Systems Constitutional: No fever/chills Eyes: No visual changes. ENT: No sore throat. Cardiovascular: Denies chest pain. Respiratory: Denies shortness of breath. Gastrointestinal: No abdominal pain.  No nausea, no vomiting.  No diarrhea.  No constipation. Genitourinary: Negative for dysuria. Musculoskeletal: Negative for back pain. Complains of neck pain and left arm pain. Skin: Negative for rash. Neurological: Negative for headaches, focal weakness or numbness.  10-point ROS otherwise negative.  ____________________________________________   PHYSICAL EXAM:  VITAL SIGNS: ED Triage Vitals  Enc Vitals Group     BP 04/17/15 1539 123/79 mmHg     Pulse Rate 04/17/15 1539 89     Resp 04/17/15 1539 18     Temp 04/17/15 1539 98.3 F (36.8 C)     Temp Source 04/17/15 1539 Oral     SpO2 04/17/15 1539 99 %     Weight 04/17/15 1539 270 lb (122.471 kg)     Height 04/17/15 1539 5'  10" (1.778 m)     Head Cir --      Peak Flow --      Pain Score 04/17/15 1540 7     Pain Loc --      Pain Edu? --      Excl. in [place]? --     Constitutional: Alert and oriented. Well appearing and in no acute distress. Eyes: Conjunctivae are normal. PERRL. EOMI. No pain with EOMs.  Head: Left face inferior orbit and upper left maxilla mild to mod TTP with mild swelling and ecchymosis.  Nose: No congestion/rhinnorhea. Mouth/Throat: Mucous membranes are moist.  Oropharynx non-erythematous. Neck: No stridor.Mild lower cervical spine tenderness to palpation.full ROM. Skin  Intact. No ecchymosis, swelling or  erythema. Hematological/Lymphatic/Immunilogical: No cervical lymphadenopathy. Cardiovascular: Normal rate, regular rhythm. Grossly normal heart sounds.  Good peripheral circulation. Respiratory: Normal respiratory effort.  No retractions. Lungs CTAB. Gastrointestinal: Soft and nontender. No distention. No abdominal bruits. No CVA tenderness. Musculoskeletal: No upper or lower extremity tenderness nor edema.  No joint effusions.No thoracic or lumbar TTP.  Except:  Left upper humerus and shoulder mild to moderate tender palpation. No pain at rest. Mild pain with left arm abduction and shoulder rotation. Full range of motion present with  5 out of 5 strength to bilateral upper and lower extremities. Bilateral distal radial pulses equal bilaterally and easily palpated, sensation to bilateral arms equal.  Bilateral handgrips equal. Patient changes position from lying to sitting to standing quickly without discomfort or distress. No thoracic or lumbar tenderness to palpation. Neurologic:  Normal speech and language. No gross focal neurologic deficits are appreciated. No gait instability. GCS 15. Cranial nerve II through XII grossly intact. Steady gait.  Skin:  Skin is warm, dry and intact. No rash noted. Psychiatric: Mood and affect are normal. Speech and behavior are normal.  ____________________________________________   LABS (all labs ordered are listed, but only abnormal results are displayed)  Labs Reviewed - No data to display ____________________________________________  RADIOLOGY  EXAM: CERVICAL SPINE 4+ VIEWS  COMPARISON: Cervical spine CT dated 04/29/2014  FINDINGS: Normal cervical lordosis.  No evidence of fracture or dislocation. Vertebral body heights and intervertebral disc spaces are maintained. Dens appears intact.  No prevertebral soft tissue swelling.  Status post ACDF at C3-4 and C4-6. No evidence of hardware complication.  Mild degenerative changes at  C6-7.  Visualized lung apices are clear.  IMPRESSION: No fracture or dislocation is seen.  Status post C3-6 ACDF. No evidence of hardware complication.  Mild degenerative changes.   Electronically Signed By: Julian Hy M.D. On: 04/17/2015 17:03          DG HumerUS Left (Final result) Result time: 04/17/15 17:03:30   Final result by Rad Results In Interface (04/17/15 17:03:30)   Narrative:   CLINICAL DATA: Fall, left arm pain  EXAM: LEFT HUMERUS - 2+ VIEW  COMPARISON: None.  FINDINGS: No fracture or dislocation is seen.  The visualized soft tissues are unremarkable.  Visualized left lung is clear.  IMPRESSION: No fracture or dislocation is seen.   Electronically Signed By: Julian Hy M.D. On: 04/17/2015 17:03          CT Head Wo Contrast (Final result) Result time: 04/17/15 16:39:02   Final result by Rad Results In Interface (04/17/15 16:39:02)   Narrative:   CLINICAL DATA: Recent fall with facial injury and periorbital ecchymosis  EXAM: CT HEAD WITHOUT CONTRAST  CT MAXILLOFACIAL WITHOUT CONTRAST  TECHNIQUE: Multidetector CT imaging of the head and maxillofacial  structures were performed using the standard protocol without intravenous contrast. Multiplanar CT image reconstructions of the maxillofacial structures were also generated.  COMPARISON: None.  FINDINGS: CT HEAD FINDINGS  Bony calvarium is intact. No gross soft tissue abnormality is noted. No findings to suggest acute hemorrhage, acute infarction or space-occupying mass lesion are noted.  CT MAXILLOFACIAL FINDINGS  A few mucosal retention cysts are noted within the maxillary antra. No acute fracture or dislocation is noted. Paranasal sinuses are otherwise within normal limits. Very mild left periorbital swelling is seen. No focal hematoma is noted.  IMPRESSION: CT of the head: No acute intracranial abnormality noted.  CT of the  maxillofacial bones: No acute bony abnormality is noted. Mild left periorbital swelling is seen. No focal hematoma is seen.   Electronically Signed By: Inez Catalina M.D. On: 04/17/2015 16:39          CT Maxillofacial WO CM (Final result) Result time: 04/17/15 16:39:02   Final result by Rad Results In Interface (04/17/15 16:39:02)   Narrative:   CLINICAL DATA: Recent fall with facial injury and periorbital ecchymosis  EXAM: CT HEAD WITHOUT CONTRAST  CT MAXILLOFACIAL WITHOUT CONTRAST  TECHNIQUE: Multidetector CT imaging of the head and maxillofacial structures were performed using the standard protocol without intravenous contrast. Multiplanar CT image reconstructions of the maxillofacial structures were also generated.  COMPARISON: None.  FINDINGS: CT HEAD FINDINGS  Bony calvarium is intact. No gross soft tissue abnormality is noted. No findings to suggest acute hemorrhage, acute infarction or space-occupying mass lesion are noted.  CT MAXILLOFACIAL FINDINGS  A few mucosal retention cysts are noted within the maxillary antra. No acute fracture or dislocation is noted. Paranasal sinuses are otherwise within normal limits. Very mild left periorbital swelling is seen. No focal hematoma is noted.  IMPRESSION: CT of the head: No acute intracranial abnormality noted.  CT of the maxillofacial bones: No acute bony abnormality is noted. Mild left periorbital swelling is seen. No focal hematoma is seen.   Electronically Signed By: Inez Catalina M.D. On: 04/17/2015 16:39    I, Marylene Land, personally viewed and evaluated these images as part of my medical decision making.   ____________________________________________   PROCEDURES  Procedure(s) performed:  Patient denies need for left arm sling or crutches.   INITIAL IMPRESSION / ASSESSMENT AND PLAN / ED COURSE  Pertinent labs & imaging results that were available during my care of the  patient were reviewed by me and considered in my medical decision making (see chart for details).  Very well-appearing patient. Presents the ER post mechanical fall this morning at her house after tripping over bathroom rug falling forward and hitting window sill. Patient presents the ER for complaints of left facial pain, headache, neck pain and left arm pain. Denies complaints prior to fall. Patient reports pain only in areas where she hit during fall.   Cervical spine complete no fracture dislocation seen status post C3-6 C6 fusion, no evidence of hardware complication, mild degenerative changes. Left humerus no fracture or dislocation seen. CT head no acute intracranial abnormality. CT of maxillary facial bones no acute bony abnormality, mild left periorbital swelling, no focal hematoma.  Patient is very well appearing. No acute distress. Presents for the complaints of pain post mechanical fall. Denies LOC. Patient to rest and apply ice. Follow up with primary care physician next week.. Ibuprofen and Tylenol 3. Discussed follow-up and return parameters. Patient verbalized understanding and agreed to plan. ____________________________________________   FINAL CLINICAL IMPRESSION(S) / ED DIAGNOSES  Final diagnoses:  Facial contusion, initial encounter  Contusion  Fall, initial encounter  Cervical strain, acute, initial encounter  Head injury, initial encounter      Marylene Land, NP 04/17/15 1907  Carrie Mew, MD 04/17/15 2119
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
## 2015-04-17 NOTE — Discharge Instructions (Signed)
Take medication as prescribed as needed for pain. Rest. Apply ice. Avoid strenuous activity. Follow-up with your primary care physician next week.  Return to the ER for new or worsening concerns.   Contusion A contusion is a deep bruise. Contusions are the result of an injury that caused bleeding under the skin. The contusion may turn blue, purple, or yellow. Minor injuries will give you a painless contusion, but more severe contusions may stay painful and swollen for a few weeks.  CAUSES  A contusion is usually caused by a blow, trauma, or direct force to an area of the body. SYMPTOMS   Swelling and redness of the injured area.  Bruising of the injured area.  Tenderness and soreness of the injured area.  Pain. DIAGNOSIS  The diagnosis can be made by taking a history and physical exam. An X-ray, CT scan, or MRI may be needed to determine if there were any associated injuries, such as fractures. TREATMENT  Specific treatment will depend on what area of the body was injured. In general, the best treatment for a contusion is resting, icing, elevating, and applying cold compresses to the injured area. Over-the-counter medicines may also be recommended for pain control. Ask your caregiver what the best treatment is for your contusion. HOME CARE INSTRUCTIONS   Put ice on the injured area.  Put ice in a plastic bag.  Place a towel between your skin and the bag.  Leave the ice on for 15-20 minutes, 3-4 times a day, or as directed by your health care provider.  Only take over-the-counter or prescription medicines for pain, discomfort, or fever as directed by your caregiver. Your caregiver may recommend avoiding anti-inflammatory medicines (aspirin, ibuprofen, and naproxen) for 48 hours because these medicines may increase bruising.  Rest the injured area.  If possible, elevate the injured area to reduce swelling. SEEK IMMEDIATE MEDICAL CARE IF:   You have increased bruising or  swelling.  You have pain that is getting worse.  Your swelling or pain is not relieved with medicines. MAKE SURE YOU:   Understand these instructions.  Will watch your condition.  Will get help right away if you are not doing well or get worse. Document Released: 06/28/2005 Document Revised: 09/23/2013 Document Reviewed: 07/24/2011 [facility] Patient Information 2015 [place], [place]. This information is not intended to replace advice given to you by your health care provider. Make sure you discuss any questions you have with your health care provider.  Head Injury You have a head injury. Headaches and throwing up (vomiting) are common after a head injury. It should be easy to wake up from sleeping. Sometimes you must stay in the hospital. Most problems happen within the first 24 hours. Side effects may occur up to 7-10 days after the injury.  WHAT ARE THE TYPES OF HEAD INJURIES? Head injuries can be as minor as a bump. Some head injuries can be more severe. More severe head injuries include:  A jarring injury to the brain (concussion).  A bruise of the brain (contusion). This mean there is bleeding in the brain that can cause swelling.  A cracked skull (skull fracture).  Bleeding in the brain that collects, clots, and forms a bump (hematoma). WHEN SHOULD I GET HELP RIGHT AWAY?   You are confused or sleepy.  You cannot be woken up.  You feel sick to your stomach (nauseous) or keep throwing up (vomiting).  Your dizziness or unsteadiness is getting worse.  You have very bad, lasting headaches that  are not helped by medicine. Take medicines only as told by your doctor.  You cannot use your arms or legs like normal.  You cannot walk.  You notice changes in the black spots in the center of the colored part of your eye (pupil).  You have clear or bloody fluid coming from your nose or ears.  You have trouble seeing. During the next 24 hours after the injury, you must stay with  someone who can watch you. This person should get help right away (call 911 in the [place] if you start to shake and are not able to control it (have seizures), you pass out, or you are unable to wake up. HOW CAN I PREVENT A HEAD INJURY IN THE FUTURE?  Wear seat belts.  Wear a helmet while bike riding and playing sports like football.  Stay away from dangerous activities around the house. WHEN CAN I RETURN TO NORMAL ACTIVITIES AND ATHLETICS? See your doctor before doing these activities. You should not do normal activities or play contact sports until 1 week after the following symptoms have stopped:  Headache that does not go away.  Dizziness.  Poor attention.  Confusion.  Memory problems.  Sickness to your stomach or throwing up.  Tiredness.  Fussiness.  Bothered by bright lights or loud noises.  Anxiousness or depression.  Restless sleep. MAKE SURE YOU:   Understand these instructions.  Will watch your condition.  Will get help right away if you are not doing well or get worse. Document Released: 08/31/2008 Document Revised: 02/02/2014 Document Reviewed: 05/26/2013 [facility] Patient Information 2015 [place], [place]. This information is not intended to replace advice given to you by your health care provider. Make sure you discuss any questions you have with your health care provider.   Cervical Strain  Cervical strain and sprain are injuries that commonly occur with "whiplash" injuries. Whiplash occurs when the neck is forcefully whipped backward or forward, such as during a motor vehicle accident or during contact sports. The muscles, ligaments, tendons, discs, and nerves of the neck are susceptible to injury when this occurs. RISK FACTORS Risk of having a whiplash injury increases if:  Osteoarthritis of the spine.  Situations that make head or neck accidents or trauma more likely.  High-risk sports (football, rugby, wrestling, hockey, auto racing, gymnastics,  diving, contact karate, or boxing).  Poor strength and flexibility of the neck.  Previous neck injury.  Poor tackling technique.  Improperly fitted or padded equipment. SYMPTOMS   Pain or stiffness in the front or back of neck or both.  Symptoms may present immediately or up to 24 hours after injury.  Dizziness, headache, nausea, and vomiting.  Muscle spasm with soreness and stiffness in the neck.  Tenderness and swelling at the injury site. PREVENTION  Learn and use proper technique (avoid tackling with the head, spearing, and head-butting; use proper falling techniques to avoid landing on the head).  Warm up and stretch properly before activity.  Maintain physical fitness:  Strength, flexibility, and endurance.  Cardiovascular fitness.  Wear properly fitted and padded protective equipment, such as padded soft collars, for participation in contact sports. PROGNOSIS  Recovery from cervical strain and sprain injuries is dependent on the extent of the injury. These injuries are usually curable in 1 week to 3 months with appropriate treatment.  RELATED COMPLICATIONS   Temporary numbness and weakness may occur if the nerve roots are damaged, and this may persist until the nerve has completely healed.  Chronic pain  due to frequent recurrence of symptoms.  Prolonged healing, especially if activity is resumed too soon (before complete recovery). TREATMENT  Treatment initially involves the use of ice and medication to help reduce pain and inflammation. It is also important to perform strengthening and stretching exercises and modify activities that worsen symptoms so the injury does not get worse. These exercises may be performed at home or with a therapist. For patients who experience severe symptoms, a soft, padded collar may be recommended to be worn around the neck.  Improving your posture may help reduce symptoms. Posture improvement includes pulling your chin and abdomen in  while sitting or standing. If you are sitting, sit in a firm chair with your buttocks against the back of the chair. While sleeping, try replacing your pillow with a small towel rolled to 2 inches in diameter, or use a cervical pillow or soft cervical collar. Poor sleeping positions delay healing.  For patients with nerve root damage, which causes numbness or weakness, the use of a cervical traction apparatus may be recommended. Surgery is rarely necessary for these injuries. However, cervical strain and sprains that are present at birth (congenital) may require surgery. MEDICATION   If pain medication is necessary, nonsteroidal anti-inflammatory medications, such as aspirin and ibuprofen, or other minor pain relievers, such as acetaminophen, are often recommended.  Do not take pain medication for 7 days before surgery.  Prescription pain relievers may be given if deemed necessary by your caregiver. Use only as directed and only as much as you need. HEAT AND COLD:   Cold treatment (icing) relieves pain and reduces inflammation. Cold treatment should be applied for 10 to 15 minutes every 2 to 3 hours for inflammation and pain and immediately after any activity that aggravates your symptoms. Use ice packs or an ice massage.  Heat treatment may be used prior to performing the stretching and strengthening activities prescribed by your caregiver, physical therapist, or athletic trainer. Use a heat pack or a warm soak. SEEK MEDICAL CARE IF:   Symptoms get worse or do not improve in 2 weeks despite treatment.  New, unexplained symptoms develop (drugs used in treatment may produce side effects). EXERCISES RANGE OF MOTION (ROM) AND STRETCHING EXERCISES - Cervical Strain and Sprain These exercises may help you when beginning to rehabilitate your injury. In order to successfully resolve your symptoms, you must improve your posture. These exercises are designed to help reduce the forward-head and  rounded-shoulder posture which contributes to this condition. Your symptoms may resolve with or without further involvement from your physician, physical therapist or athletic trainer. While completing these exercises, remember:   Restoring tissue flexibility helps normal motion to return to the joints. This allows healthier, less painful movement and activity.  An effective stretch should be held for at least 20 seconds, although you may need to begin with shorter hold times for comfort.  A stretch should never be painful. You should only feel a gentle lengthening or release in the stretched tissue. STRETCH- Axial Extensors  Lie on your back on the floor. You may bend your knees for comfort. Place a rolled-up hand towel or dish towel, about 2 inches in diameter, under the part of your head that makes contact with the floor.  Gently tuck your chin, as if trying to make a "double chin," until you feel a gentle stretch at the base of your head.  Hold __________ seconds. Repeat __________ times. Complete this exercise __________ times per day.  STRETCH -  Axial Extension   Stand or sit on a firm surface. Assume a good posture: chest up, shoulders drawn back, abdominal muscles slightly tense, knees unlocked (if standing) and feet hip width apart.  Slowly retract your chin so your head slides back and your chin slightly lowers. Continue to look straight ahead.  You should feel a gentle stretch in the back of your head. Be certain not to feel an aggressive stretch since this can cause headaches later.  Hold for __________ seconds. Repeat __________ times. Complete this exercise __________ times per day. STRETCH - Cervical Side Bend   Stand or sit on a firm surface. Assume a good posture: chest up, shoulders drawn back, abdominal muscles slightly tense, knees unlocked (if standing) and feet hip width apart.  Without letting your nose or shoulders move, slowly tip your right / left ear to your  shoulder until your feel a gentle stretch in the muscles on the opposite side of your neck.  Hold __________ seconds. Repeat __________ times. Complete this exercise __________ times per day. STRETCH - Cervical Rotators   Stand or sit on a firm surface. Assume a good posture: chest up, shoulders drawn back, abdominal muscles slightly tense, knees unlocked (if standing) and feet hip width apart.  Keeping your eyes level with the ground, slowly turn your head until you feel a gentle stretch along the back and opposite side of your neck.  Hold __________ seconds. Repeat __________ times. Complete this exercise __________ times per day. RANGE OF MOTION - Neck Circles   Stand or sit on a firm surface. Assume a good posture: chest up, shoulders drawn back, abdominal muscles slightly tense, knees unlocked (if standing) and feet hip width apart.  Gently roll your head down and around from the back of one shoulder to the back of the other. The motion should never be forced or painful.  Repeat the motion 10-20 times, or until you feel the neck muscles relax and loosen. Repeat __________ times. Complete the exercise __________ times per day. STRENGTHENING EXERCISES - Cervical Strain and Sprain These exercises may help you when beginning to rehabilitate your injury. They may resolve your symptoms with or without further involvement from your physician, physical therapist, or athletic trainer. While completing these exercises, remember:   Muscles can gain both the endurance and the strength needed for everyday activities through controlled exercises.  Complete these exercises as instructed by your physician, physical therapist, or athletic trainer. Progress the resistance and repetitions only as guided.  You may experience muscle soreness or fatigue, but the pain or discomfort you are trying to eliminate should never worsen during these exercises. If this pain does worsen, stop and make certain you are  following the directions exactly. If the pain is still present after adjustments, discontinue the exercise until you can discuss the trouble with your clinician. STRENGTH - Cervical Flexors, Isometric  Face a wall, standing about 6 inches away. Place a small pillow, a ball about 6-8 inches in diameter, or a folded towel between your forehead and the wall.  Slightly tuck your chin and gently push your forehead into the soft object. Push only with mild to moderate intensity, building up tension gradually. Keep your jaw and forehead relaxed.  Hold 10 to 20 seconds. Keep your breathing relaxed.  Release the tension slowly. Relax your neck muscles completely before you start the next repetition. Repeat __________ times. Complete this exercise __________ times per day. STRENGTH- Cervical Lateral Flexors, Isometric   Stand about 6  inches away from a wall. Place a small pillow, a ball about 6-8 inches in diameter, or a folded towel between the side of your head and the wall.  Slightly tuck your chin and gently tilt your head into the soft object. Push only with mild to moderate intensity, building up tension gradually. Keep your jaw and forehead relaxed.  Hold 10 to 20 seconds. Keep your breathing relaxed.  Release the tension slowly. Relax your neck muscles completely before you start the next repetition. Repeat __________ times. Complete this exercise __________ times per day. STRENGTH - Cervical Extensors, Isometric   Stand about 6 inches away from a wall. Place a small pillow, a ball about 6-8 inches in diameter, or a folded towel between the back of your head and the wall.  Slightly tuck your chin and gently tilt your head back into the soft object. Push only with mild to moderate intensity, building up tension gradually. Keep your jaw and forehead relaxed.  Hold 10 to 20 seconds. Keep your breathing relaxed.  Release the tension slowly. Relax your neck muscles completely before you start  the next repetition. Repeat __________ times. Complete this exercise __________ times per day. POSTURE AND BODY MECHANICS CONSIDERATIONS - Cervical Strain and Sprain Keeping correct posture when sitting, standing or completing your activities will reduce the stress put on different body tissues, allowing injured tissues a chance to heal and limiting painful experiences. The following are general guidelines for improved posture. Your physician or physical therapist will provide you with any instructions specific to your needs. While reading these guidelines, remember:  The exercises prescribed by your provider will help you have the flexibility and strength to maintain correct postures.  The correct posture provides the optimal environment for your joints to work. All of your joints have less wear and tear when properly supported by a spine with good posture. This means you will experience a healthier, less painful body.  Correct posture must be practiced with all of your activities, especially prolonged sitting and standing. Correct posture is as important when doing repetitive low-stress activities (typing) as it is when doing a single heavy-load activity (lifting). PROLONGED STANDING WHILE SLIGHTLY LEANING FORWARD When completing a task that requires you to lean forward while standing in one place for a long time, place either foot up on a stationary 2- to 4-inch high object to help maintain the best posture. When both feet are on the ground, the low back tends to lose its slight inward curve. If this curve flattens (or becomes too large), then the back and your other joints will experience too much stress, fatigue more quickly, and can cause pain.  RESTING POSITIONS Consider which positions are most painful for you when choosing a resting position. If you have pain with flexion-based activities (sitting, bending, stooping, squatting), choose a position that allows you to rest in a less flexed  posture. You would want to avoid curling into a fetal position on your side. If your pain worsens with extension-based activities (prolonged standing, working overhead), avoid resting in an extended position such as sleeping on your stomach. Most people will find more comfort when they rest with their spine in a more neutral position, neither too rounded nor too arched. Lying on a non-sagging bed on your side with a pillow between your knees, or on your back with a pillow under your knees will often provide some relief. Keep in mind, being in any one position for a prolonged period of time, no  matter how correct your posture, can still lead to stiffness. WALKING Walk with an upright posture. Your ears, shoulders, and hips should all line up. OFFICE WORK When working at a desk, create an environment that supports good, upright posture. Without extra support, muscles fatigue and lead to excessive strain on joints and other tissues. CHAIR:  A chair should be able to slide under your desk when your back makes contact with the back of the chair. This allows you to work closely.  The chair's height should allow your eyes to be level with the upper part of your monitor and your hands to be slightly lower than your elbows.  Body position:  Your feet should make contact with the floor. If this is not possible, use a foot rest.  Keep your ears over your shoulders. This will reduce stress on your neck and low back. Document Released: 09/18/2005 Document Revised: 02/02/2014 Document Reviewed: 12/31/2008 [facility] Patient Information 2015 [place], [place]. This information is not intended to replace advice given to you by your health care provider. Make sure you discuss any questions you have with your health care provider.

## 2015-04-17 NOTE — Discharge Instructions (Signed)

## 2015-04-17 NOTE — ED Notes (Signed)
States tripped on rug in bathroom and hit L face on windowsill. Denies dizzyness prior to or LOC after fall. L periorbital ecchymosis and L arm pain

## 2015-04-17 NOTE — ED Notes (Signed)
Patient with no complaints at this time. Respirations even and unlabored. Skin warm/dry. Discharge instructions reviewed with patient at this time. Patient given opportunity to voice concerns/ask questions. Patient discharged at this time and left Emergency Department, via wheelchair.   

## 2015-04-17 NOTE — ED Notes (Signed)
Computer will not sign on and pt unable to walk to another room for

## 2015-04-21 DIAGNOSIS — R600 Localized edema: Secondary | ICD-10-CM | POA: Insufficient documentation

## 2015-05-07 DIAGNOSIS — M48062 Spinal stenosis, lumbar region with neurogenic claudication: Secondary | ICD-10-CM | POA: Insufficient documentation

## 2015-09-29 ENCOUNTER — Encounter: Payer: Self-pay | Admitting: *Deleted

## 2015-09-29 ENCOUNTER — Emergency Department: Payer: Medicaid Other

## 2015-09-29 ENCOUNTER — Emergency Department
Admission: EM | Admit: 2015-09-29 | Discharge: 2015-09-29 | Disposition: A | Discharge status: Home / Self Care | Payer: Medicaid Other | Attending: Emergency Medicine | Admitting: Emergency Medicine

## 2015-09-29 DIAGNOSIS — R0602 Shortness of breath: Secondary | ICD-10-CM | POA: Diagnosis not present

## 2015-09-29 DIAGNOSIS — R42 Dizziness and giddiness: Secondary | ICD-10-CM | POA: Insufficient documentation

## 2015-09-29 DIAGNOSIS — R079 Chest pain, unspecified: Secondary | ICD-10-CM | POA: Diagnosis present

## 2015-09-29 DIAGNOSIS — I10 Essential (primary) hypertension: Secondary | ICD-10-CM | POA: Insufficient documentation

## 2015-09-29 DIAGNOSIS — Z79899 Other long term (current) drug therapy: Secondary | ICD-10-CM | POA: Diagnosis not present

## 2015-09-29 DIAGNOSIS — R0789 Other chest pain: Secondary | ICD-10-CM | POA: Insufficient documentation

## 2015-09-29 LAB — BASIC METABOLIC PANEL
ANION GAP: 7 (ref 5–15)
BUN: 19 mg/dL (ref 6–20)
CO2: 24 mmol/L (ref 22–32)
CREATININE: 0.73 mg/dL (ref 0.44–1.00)
Calcium: 8.6 mg/dL — ABNORMAL LOW (ref 8.9–10.3)
Chloride: 107 mmol/L (ref 101–111)
GFR calc Af Amer: >60 mL/min (ref >60)
GFR calc non Af Amer: >60 mL/min (ref >60)
GLUCOSE: 117 mg/dL — AB (ref 65–99)
Potassium: 3.8 mmol/L (ref 3.5–5.1)
Sodium: 138 mmol/L (ref 135–145)

## 2015-09-29 LAB — CBC
HCT: 37.3 % (ref 35.0–47.0)
HEMOGLOBIN: 12.6 g/dL (ref 12.0–16.0)
MCH: 28.6 pg (ref 26.0–34.0)
MCHC: 33.7 g/dL (ref 32.0–36.0)
MCV: 85 fL (ref 80.0–100.0)
Platelets: 274 10*3/uL (ref 150–440)
RBC: 4.39 MIL/uL (ref 3.80–5.20)
RDW: 13.6 % (ref 11.5–14.5)
WBC: 11.1 10*3/uL — ABNORMAL HIGH (ref 3.6–11.0)

## 2015-09-29 LAB — FIBRIN DERIVATIVES D-DIMER (ARMC ONLY): Fibrin derivatives D-dimer (ARMC): 603 — ABNORMAL HIGH (ref 0–499)

## 2015-09-29 LAB — TROPONIN I
TROPONIN I: 0.03 ng/mL (ref <0.031)
Troponin I: <0.03 ng/mL (ref <0.031)

## 2015-09-29 IMAGING — CR DG CHEST 2V
2 series · 2 of 2 positions shown · non-contrast
Comparison: [DATE] and [DATE]

CLINICAL DATA: Chest pain and shortness of breath.

EXAM:
CHEST  2 VIEW

[chest pa]
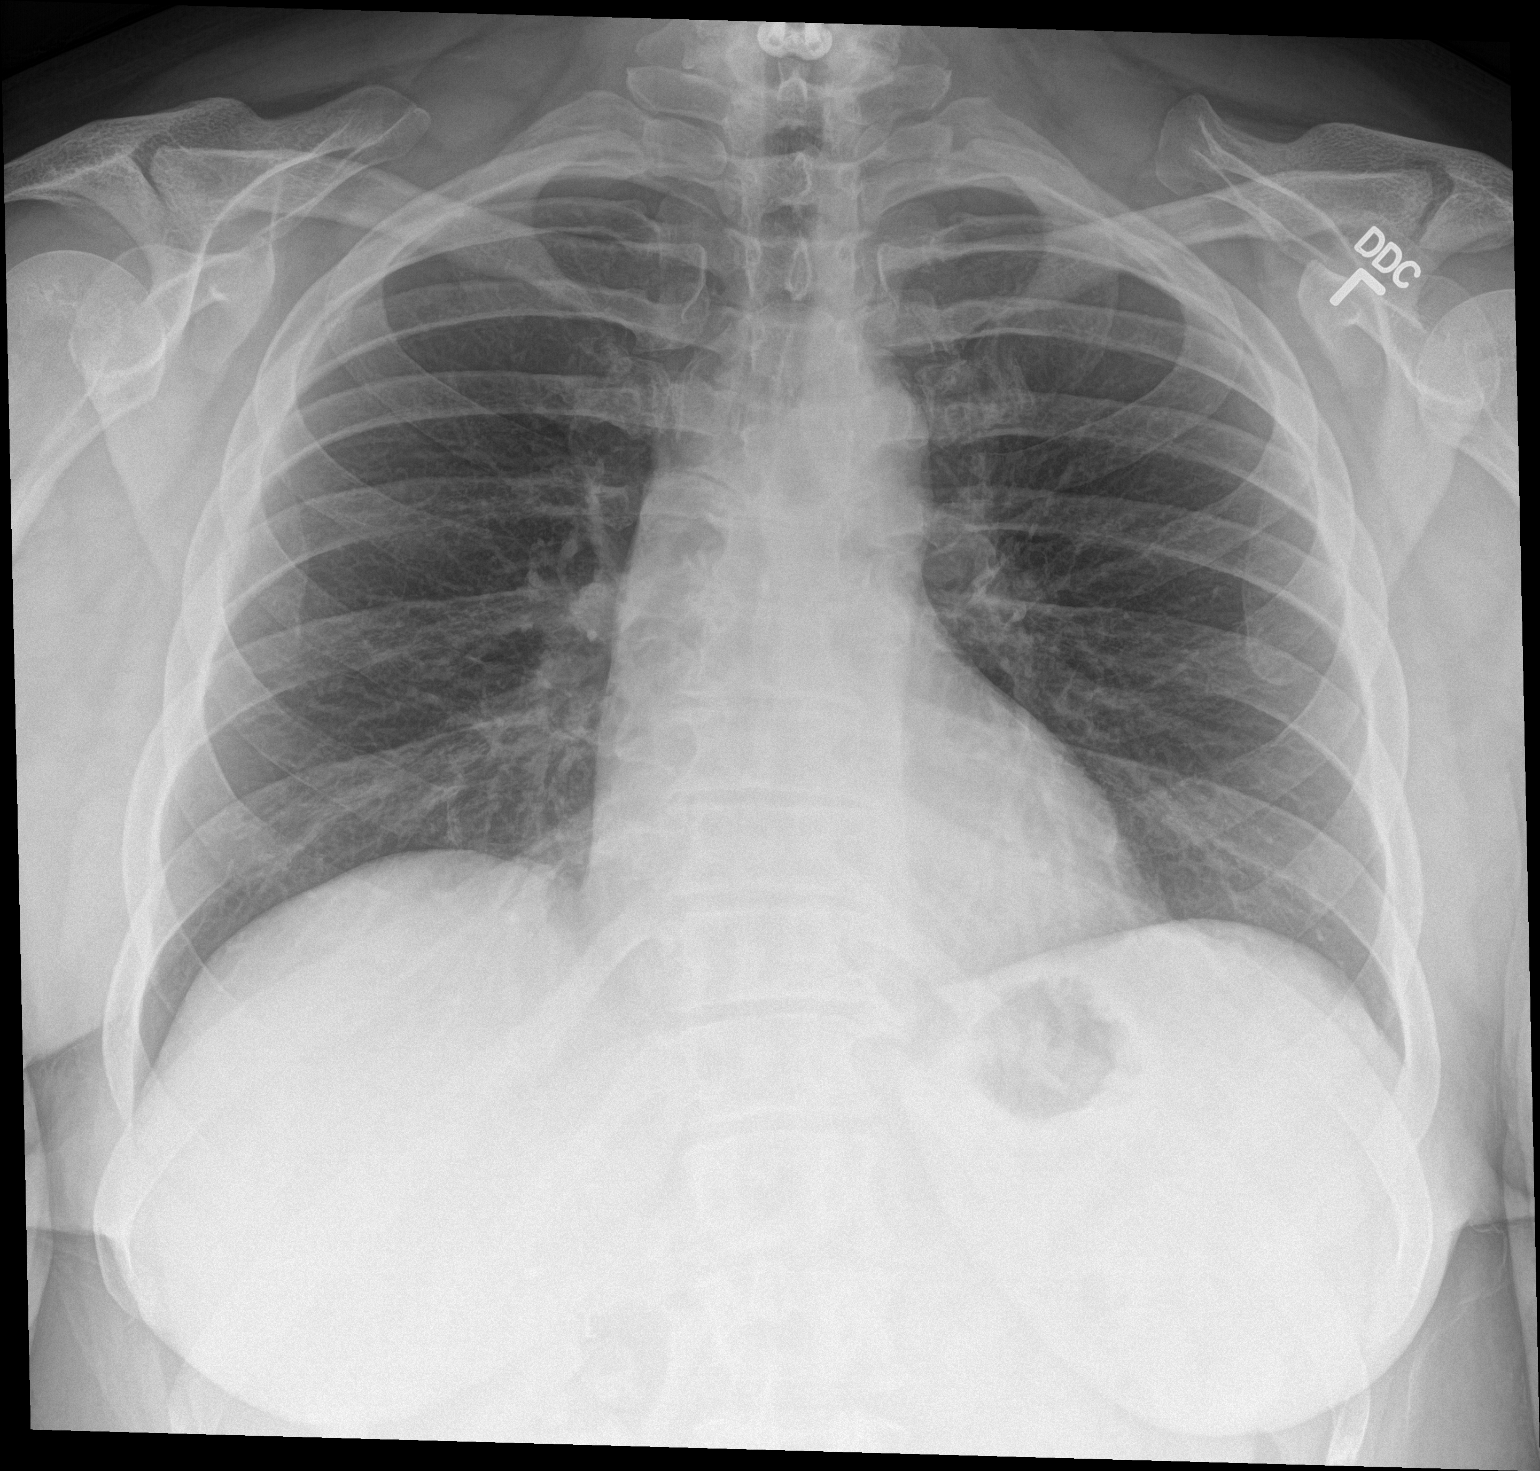

[chest lat]
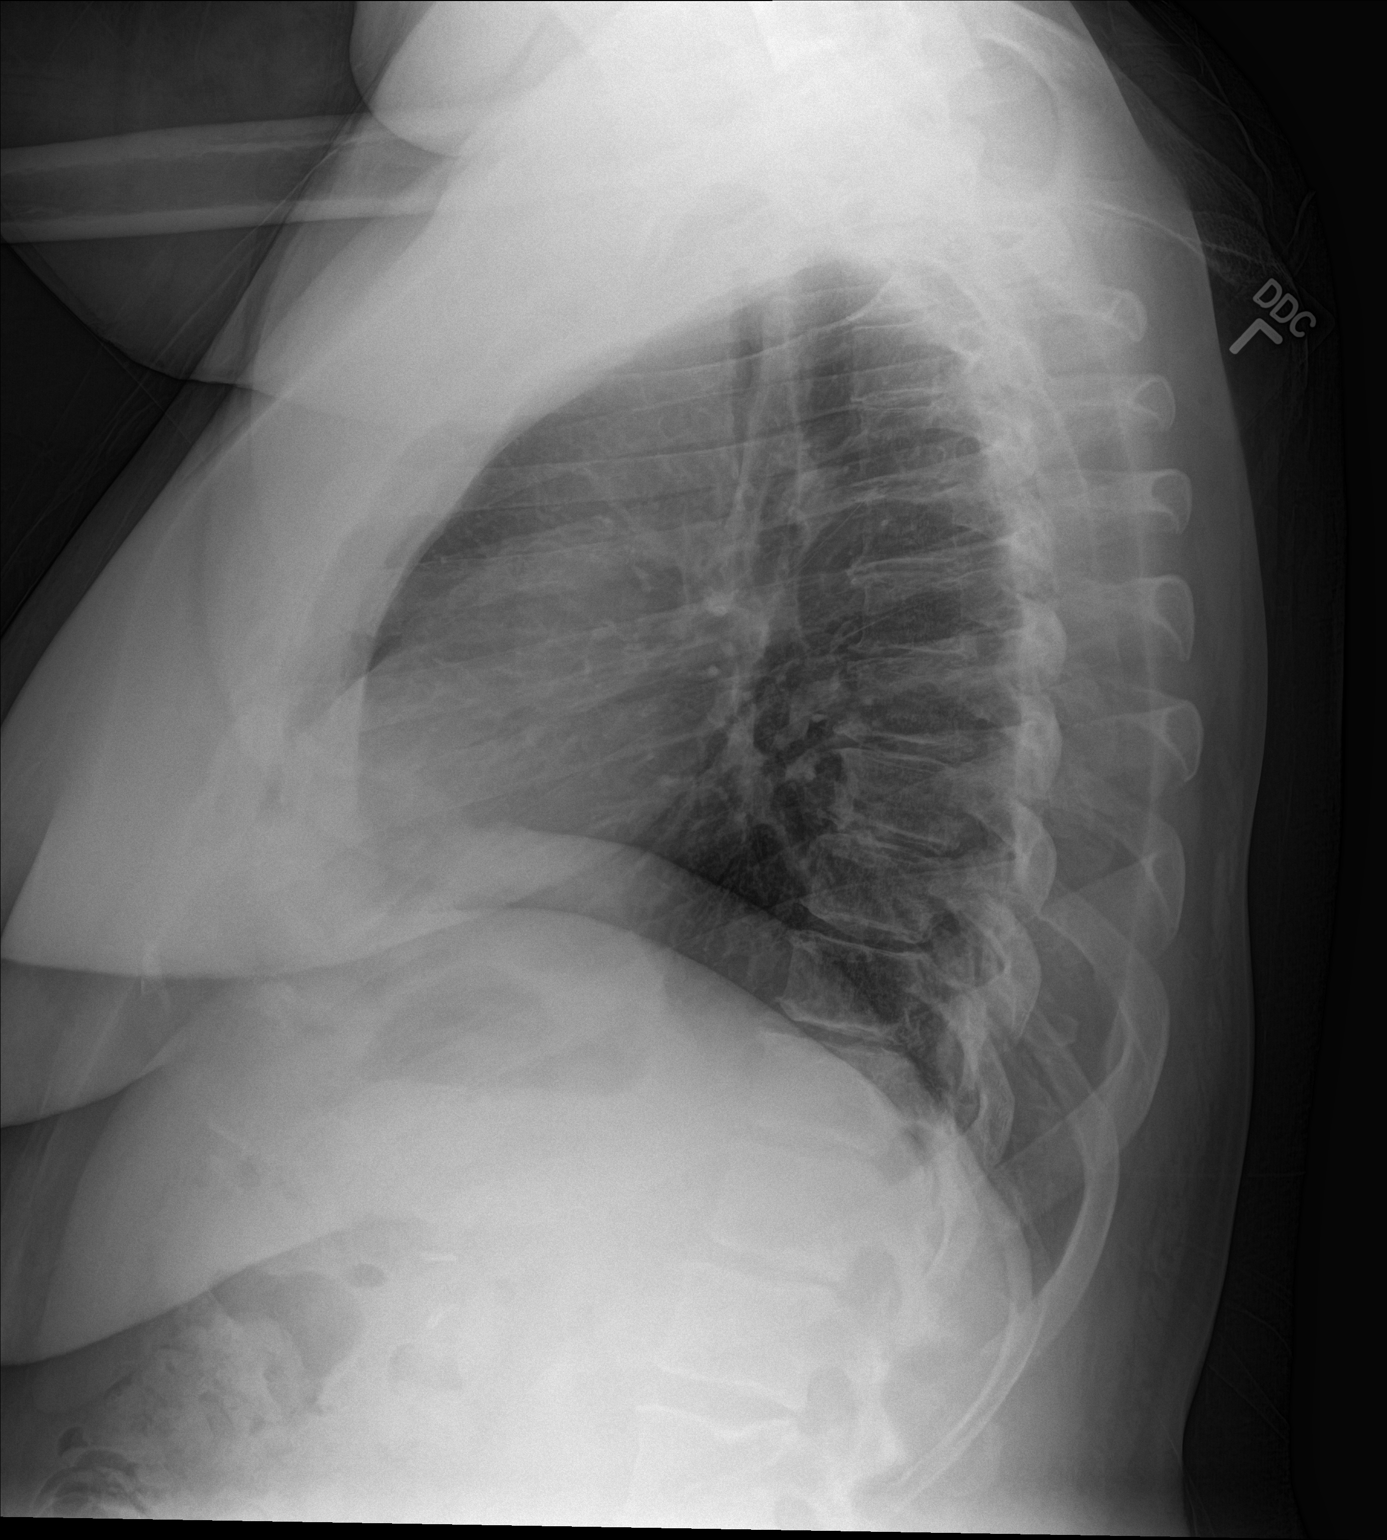

[2 of 2 positions shown; findings below may reference images not displayed]

FINDINGS: The heart size and mediastinal contours are within normal limits.
Both lungs are clear. The visualized skeletal structures are
unremarkable.
IMPRESSION: Normal exam.

## 2015-09-29 IMAGING — CT CT ANGIO CHEST
1 of 2 series · 18 of 30 positions shown · IV contrast (APPLIED)
Comparison: Radiograph dated [DATE]

CLINICAL DATA: 44-year-old female with sharp chest pain and
dizziness.

EXAM:
CT ANGIOGRAPHY CHEST WITH CONTRAST
TECHNIQUE: Multidetector CT imaging of the chest was performed using the
standard protocol during bolus administration of intravenous
contrast. Multiplanar CT image reconstructions and MIPs were
obtained to evaluate the vascular anatomy.
CONTRAST:  100mL OMNIPAQUE IOHEXOL 350 MG/ML SOLN

[Series 5: pe 1.0 thins · axial · 0.86mm/px · z∈[-241,+24]mm · 18 of 299 slices shown]
[im 17/299  lung]
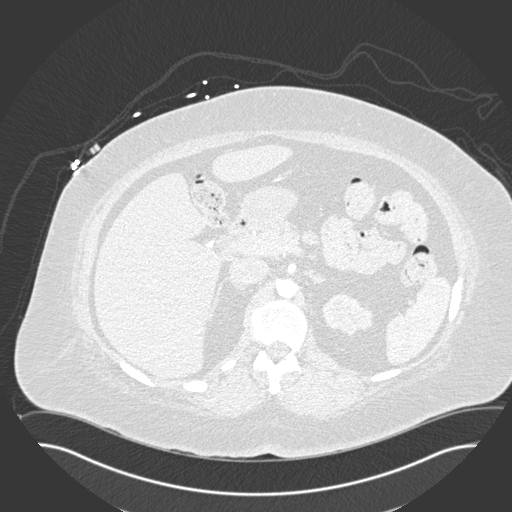
[im 34/299  mediastinal]
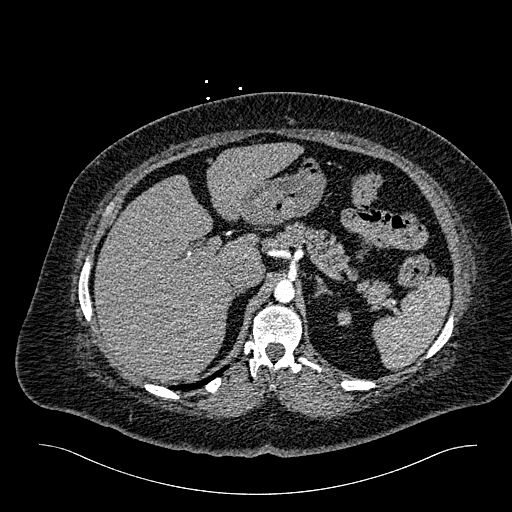
[im 50/299  lung]
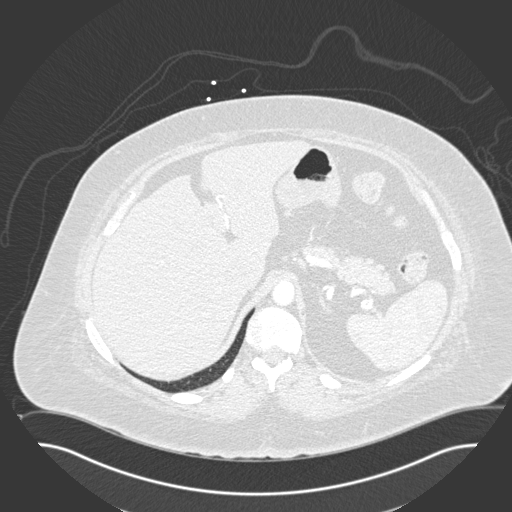
[im 67/299  mediastinal]
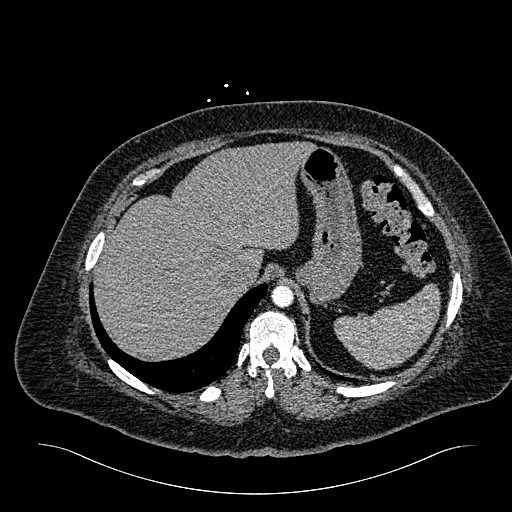
[im 83/299  lung]
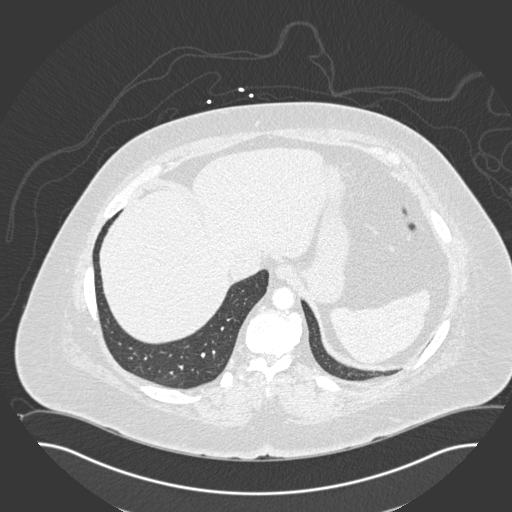
[im 100/299  mediastinal]
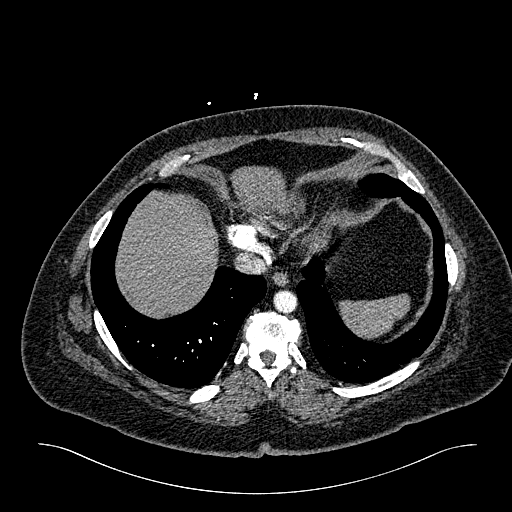
[im 116/299  lung]
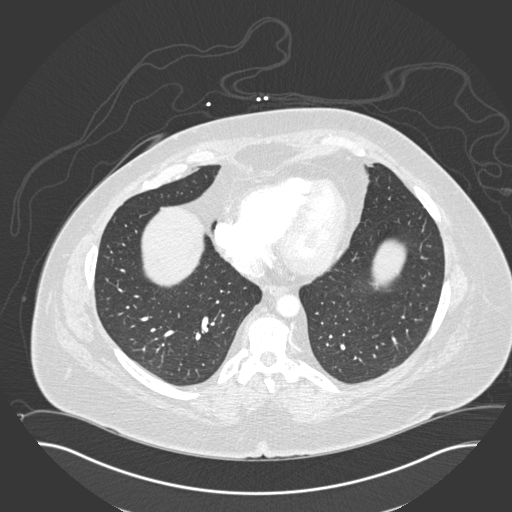
[im 133/299  mediastinal]
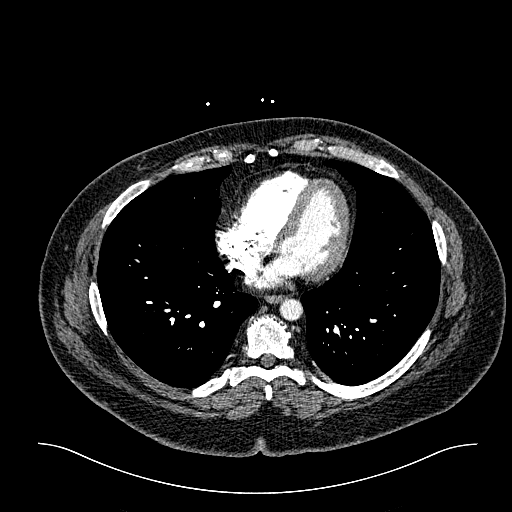
[im 140/299  lung]
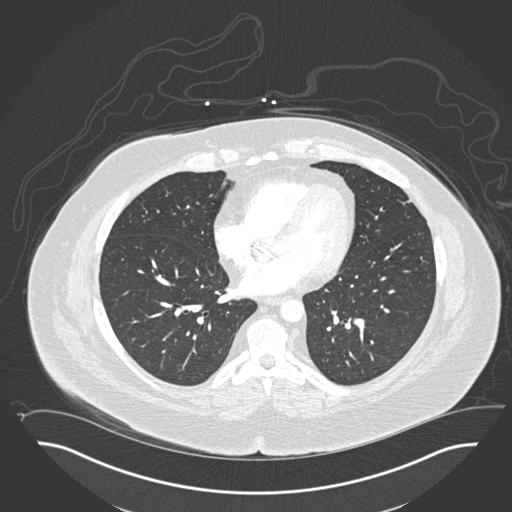
[im 150/299  mediastinal]
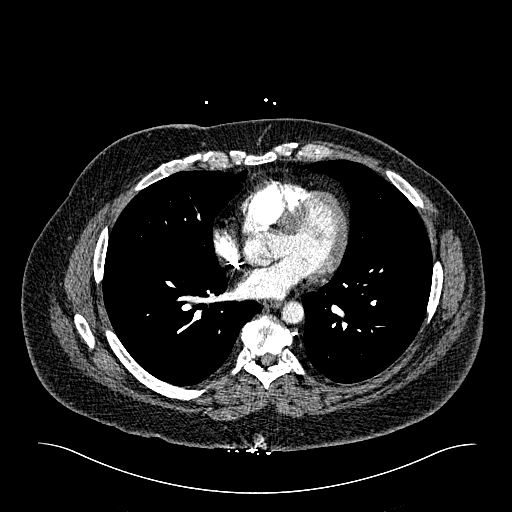
[im 166/299  lung]
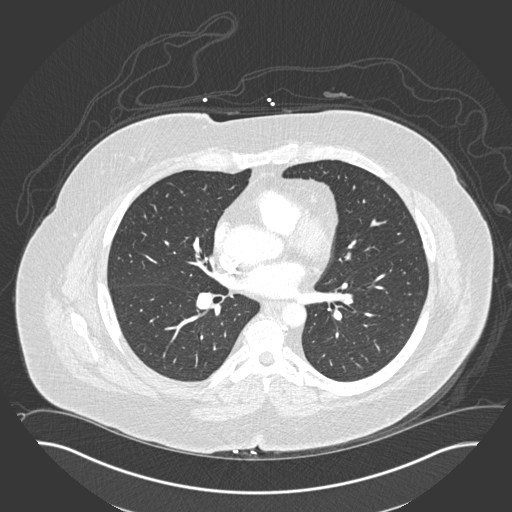
[im 183/299  mediastinal]
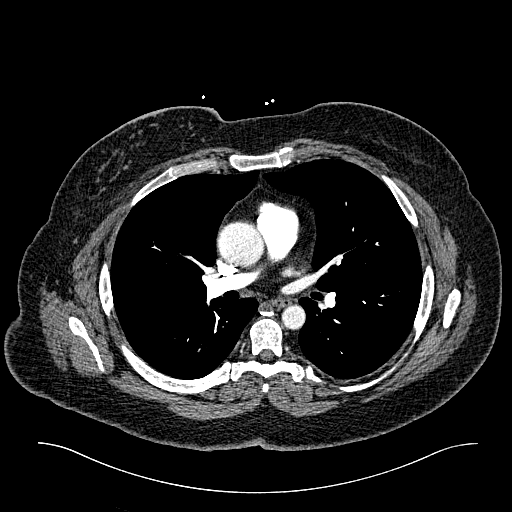
[im 199/299  lung]
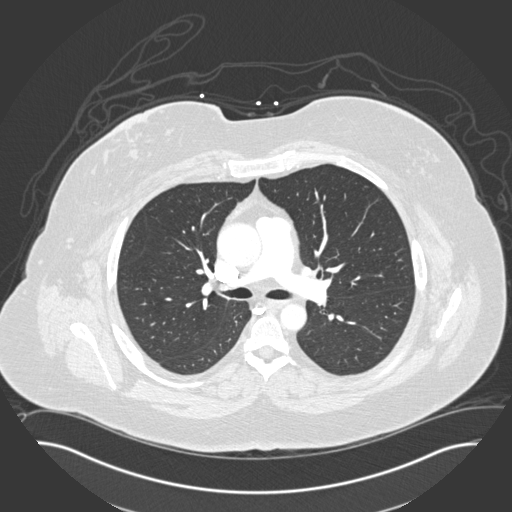
[im 216/299  mediastinal]
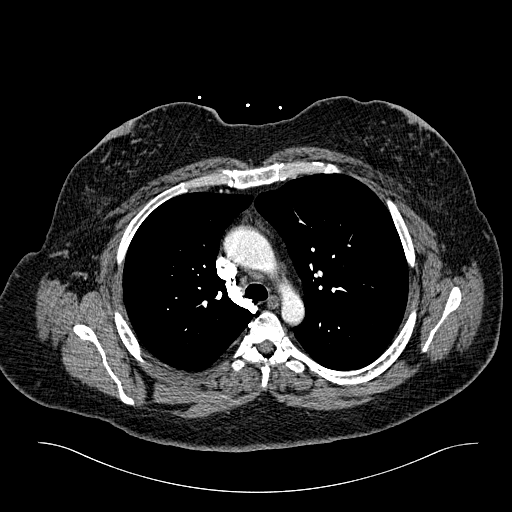
[im 232/299  lung]
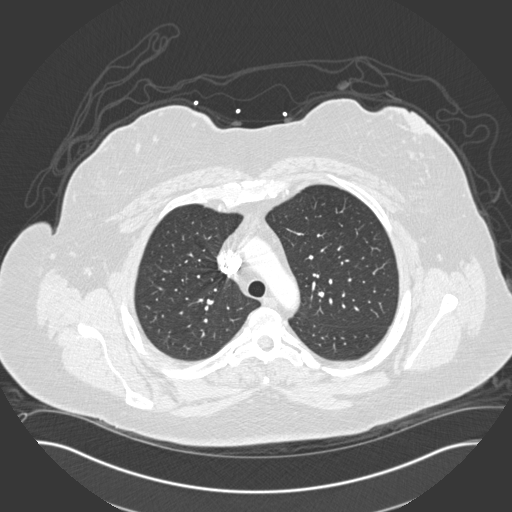
[im 249/299  mediastinal]
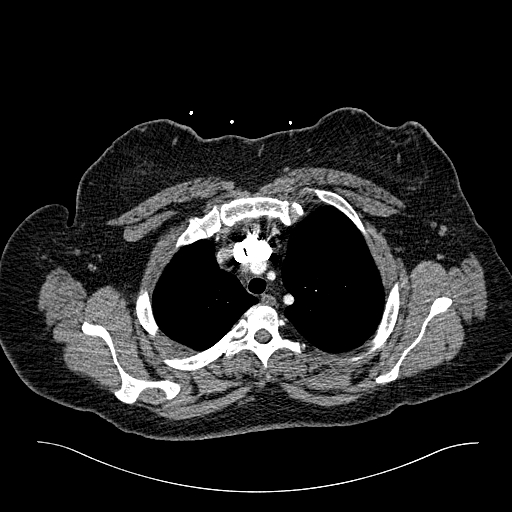
[im 265/299  lung]
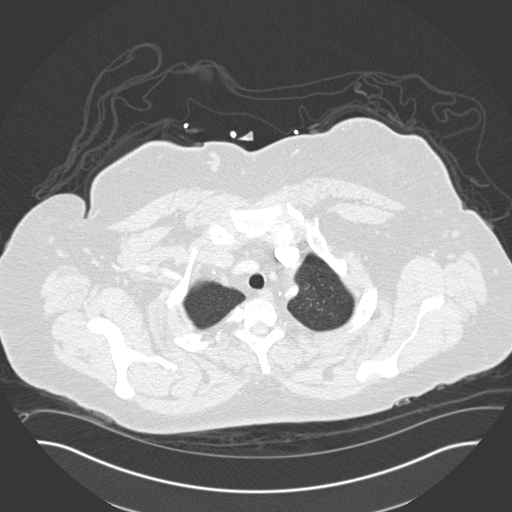
[im 282/299  mediastinal]
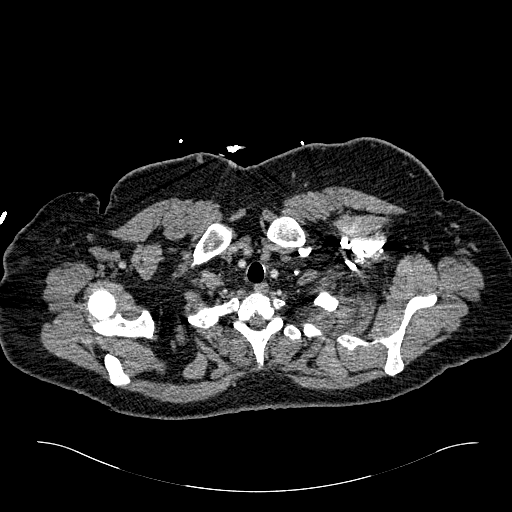

[18 of 30 positions shown; findings below may reference images not displayed]

FINDINGS: The lungs are clear. No pleural effusions or pneumothorax. The
central airways are patent. Top-normal caliber ascending aorta
measuring 4 cm in diameter. The descending thoracic aorta appears
normal in caliber. No evidence of aortic dissection. Caps no CT
evidence of pulmonary embolism.

There is no cardiomegaly or pericardial effusion. No hilar or
mediastinal adenopathy. The visualized esophagus and the thyroid
glands appear unremarkable. There is no axillary adenopathy the
chest wall soft tissues appear unremarkable. There is degenerative
changes of the spine. No acute fracture. There is apparent atrophy
of the visualized kidneys.

Review of the MIP images confirms the above findings.
IMPRESSION: No CT evidence of pulmonary embolism.

Top-normal ascending aorta measuring up to 4 cm in diameter.

## 2015-09-29 MED ORDER — IOHEXOL 350 MG/ML SOLN
100.0000 mL | Freq: Once | INTRAVENOUS | Status: AC | PRN
Start: 2015-09-29 — End: 2015-09-29
  Administered 2015-09-29: 100 mL via INTRAVENOUS

## 2015-09-29 MED ORDER — ASPIRIN 81 MG PO CHEW
324.0000 mg | CHEWABLE_TABLET | Freq: Once | ORAL | Status: AC
  Administered 2015-09-29: 324 mg via ORAL
  Filled 2015-09-29 (×2): qty 4

## 2015-09-29 MED ORDER — ACETAMINOPHEN 500 MG PO TABS
1000.0000 mg | ORAL_TABLET | Freq: Once | ORAL | Status: AC
  Administered 2015-09-29: 1000 mg via ORAL
  Filled 2015-09-29: qty 2

## 2015-09-29 NOTE — ED Notes (Signed)
Patient transported to CT Angio Chest PE W/Cm &/Or Wo Cm

## 2015-09-29 NOTE — ED Provider Notes (Signed)
[facility] Emergency Department Provider Note  ____________________________________________  Time seen: Approximately 4:56 PM  I have reviewed the triage vital signs and the nursing notes.   HISTORY  Chief Complaint Chest Pain    HPI Mary Chambers is a 44 y.o. female with a past medical history of hypertension and obesity who presents with 3 days of constant but waxing and waning midsternal sharp chest pain with minimal associated dizziness and shortness of breath.  Nothing in particular makes it better and nothing makes it worse except that taking a very deep breath does somewhat accentuated the pain.  She has had no nausea or vomiting, no fever nor chills, no cough, no abdominal pain.  She has had no prior cardiac issues although she reports that a couple of years ago she was admitted at [facility] for similar chest pain and stayed overnight for repeat lab work and was discharged but never had any stress test or invasive evaluation.  She has not been on any long trips recently, denies using birth control pills or any type of exogenous estrogen, has not had any unilateral leg pain or swelling, has no cancer history.   Past Medical History  Diagnosis Date  . Hypertension     There are no active problems to display for this patient.   Past Surgical History  Procedure Laterality Date  . Cholecystectomy    . Abdominal hysterectomy    . Tubal ligation    . Lithotripsy    . Neck fusion      Current Outpatient Rx  Name  Route  Sig  Dispense  Refill  . enalapril (VASOTEC) 10 MG tablet   Oral   Take 10 mg by mouth daily.         . hydrochlorothiazide (HYDRODIURIL) 25 MG tablet   Oral   Take 25 mg by mouth daily.         . Multiple Vitamin (MULTIVITAMIN WITH MINERALS) TABS tablet   Oral   Take 1 tablet by mouth daily.         . potassium chloride SA (K-DUR,KLOR-CON) 20 MEQ tablet   Oral   Take 20 mEq by mouth 2 (two) times daily.         Marland Kitchen  acetaminophen-codeine (TYLENOL #3) 300-30 MG per tablet   Oral   Take 1 tablet by mouth every 8 (eight) hours as needed for moderate pain or severe pain (Do not take other pain medication or drive or operate machinery while taking as can cause drowsiness.). Patient not taking: Reported on 09/29/2015   9 tablet   0   . ibuprofen (ADVIL,MOTRIN) 800 MG tablet   Oral   Take 1 tablet (800 mg total) by mouth every 8 (eight) hours as needed for mild pain or moderate pain. Patient not taking: Reported on 09/29/2015   15 tablet   0     Allergies Doxycycline; Percocet; and Septra  No family history on file.  Social History Social History  Substance Use Topics  . Smoking status: Never Smoker   . Smokeless tobacco: None  . Alcohol Use: No    Review of Systems Constitutional: No fever/chills Eyes: No visual changes. ENT: No sore throat. Cardiovascular: Sharp mid sternal chest pain Respiratory: Minimal shortness of breath associated with chest pain Gastrointestinal: No abdominal pain.  No nausea, no vomiting.  No diarrhea.  No constipation. Genitourinary: Negative for dysuria. Musculoskeletal: Negative for back pain. Skin: Negative for rash. Neurological: Negative for headaches, focal weakness  or numbness.  10-point ROS otherwise negative.  ____________________________________________   PHYSICAL EXAM:  VITAL SIGNS: ED Triage Vitals  Enc Vitals Group     BP 09/29/15 1449 134/64 mmHg     Pulse Rate 09/29/15 1449 79     Resp 09/29/15 1449 18     Temp 09/29/15 1449 98.2 F (36.8 C)     Temp Source 09/29/15 1449 Oral     SpO2 09/29/15 1449 97 %     Weight 09/29/15 1449 265 lb (120.203 kg)     Height 09/29/15 1449 5\' 10"  (1.778 m)     Head Cir --      Peak Flow --      Pain Score 09/29/15 1456 8     Pain Loc --      Pain Edu? --      Excl. in [place]? --     Constitutional: Alert and oriented. Well appearing and in no acute distress. Eyes: Conjunctivae are normal. PERRL.  EOMI. Head: Atraumatic. Nose: No congestion/rhinnorhea. Mouth/Throat: Mucous membranes are moist.  Oropharynx non-erythematous. Neck: No stridor.   Cardiovascular: Normal rate, regular rhythm. Grossly normal heart sounds.  Good peripheral circulation.  No reproducible chest wall tenderness to palpation Respiratory: Normal respiratory effort.  No retractions. Lungs CTAB. Gastrointestinal: Obese, Soft and nontender. No distention. No abdominal bruits. No CVA tenderness. Musculoskeletal: No lower extremity tenderness nor edema.  No joint effusions. Neurologic:  Normal speech and language. No gross focal neurologic deficits are appreciated.  Skin:  Skin is warm, dry and intact. No rash noted. Psychiatric: Mood and affect are normal. Speech and behavior are normal.  ____________________________________________   LABS (all labs ordered are listed, but only abnormal results are displayed)  Labs Reviewed  BASIC METABOLIC PANEL - Abnormal; Notable for the following:    Glucose, Bld 117 (*)    Calcium 8.6 (*)    All other components within normal limits  CBC - Abnormal; Notable for the following:    WBC 11.1 (*)    All other components within normal limits  FIBRIN DERIVATIVES D-DIMER ([facility] ONLY) - Abnormal; Notable for the following:    Fibrin derivatives D-dimer ([facility]) 603 (*)    All other components within normal limits  TROPONIN I  TROPONIN I   ____________________________________________  EKG  ED ECG REPORT I, Arvid Marengo, the attending physician, personally viewed and interpreted this ECG.   Date: 09/29/2015  EKG Time: 14:48  Rate: 83  Rhythm: normal sinus rhythm  Axis: Normal  Intervals:Normal  ST&T Change: No evidence of acute ischemia  ____________________________________________  RADIOLOGY   Dg Chest 2 View  09/29/2015  CLINICAL DATA:  Chest pain and shortness of breath. EXAM: CHEST  2 VIEW COMPARISON:  01/11/2006 and 11/26/2005 FINDINGS: The heart size and  mediastinal contours are within normal limits. Both lungs are clear. The visualized skeletal structures are unremarkable. IMPRESSION: Normal exam. Electronically Signed   By: Lorriane Shire M.D.   On: 09/29/2015 15:12   Ct Angio Chest Pe W/cm &/or Wo Cm  09/29/2015  CLINICAL DATA:  44 year old female with sharp chest pain and dizziness. EXAM: CT ANGIOGRAPHY CHEST WITH CONTRAST TECHNIQUE: Multidetector CT imaging of the chest was performed using the standard protocol during bolus administration of intravenous contrast. Multiplanar CT image reconstructions and MIPs were obtained to evaluate the vascular anatomy. CONTRAST:  146mL OMNIPAQUE IOHEXOL 350 MG/ML SOLN COMPARISON:  Radiograph dated 09/29/2015 FINDINGS: The lungs are clear. No pleural effusions or pneumothorax. The central airways are patent.  Top-normal caliber ascending aorta measuring 4 cm in diameter. The descending thoracic aorta appears normal in caliber. No evidence of aortic dissection. Caps no CT evidence of pulmonary embolism. There is no cardiomegaly or pericardial effusion. No hilar or mediastinal adenopathy. The visualized esophagus and the thyroid glands appear unremarkable. There is no axillary adenopathy the chest wall soft tissues appear unremarkable. There is degenerative changes of the spine. No acute fracture. There is apparent atrophy of the visualized kidneys. Review of the MIP images confirms the above findings. IMPRESSION: No CT evidence of pulmonary embolism. Top-normal ascending aorta measuring up to 4 cm in diameter. Electronically Signed   By: Anner Crete M.D.   On: 09/29/2015 21:35    ____________________________________________   PROCEDURES  Procedure(s) performed: None  Critical Care performed: No ____________________________________________   INITIAL IMPRESSION / ASSESSMENT AND PLAN / ED COURSE  Pertinent labs & imaging results that were available during my care of the patient were reviewed by me and  considered in my medical decision making (see chart for details).  The pleuritic nature of the chest pain makes me consider PE as well as ACS.  ACS is unlikely given that she has only hypertension as a risk factor (there is no close family history of ACS/CAD; her HEART score is at most 2 (low risk).  I have no better alternative diagnosis then a concern for pulmonary embolism and she is appropriate for a d-dimer rule out.  I explained to her that if the d-dimer is positive we will need to evaluate with a CT Angie of her chest and she understands and agrees with this plan.  I am giving her a full dose aspirin.  I am also checking a second troponin is the first was 0.03 as opposed to less than 0.03.  ----------------------------------------- 10:57 PM on 09/29/2015 -----------------------------------------  The patient's d-dimer was slightly elevated.  Her second troponin was negative.  Her CT angios chest was unremarkable.  She has been asymptomatic in the emergency department for hours and her family brought her a cheeseburger meal which she ate with no difficulty.  I explained to her that I believe we have ruled out the acute and emergent causes of acute chest pain and that she should follow-up with her outpatient doctor at the next available opportunity.  I gave my usual and customary return precautions.     ____________________________________________  FINAL CLINICAL IMPRESSION(S) / ED DIAGNOSES  Final diagnoses:  Atypical chest pain      NEW MEDICATIONS STARTED DURING THIS VISIT:  New Prescriptions   No medications on file     Hinda Kehr, MD 09/29/15 2259

## 2015-09-29 NOTE — ED Notes (Signed)
Past 3 days c/o constant sharp chest pain, some dizziness

## 2015-09-29 NOTE — Discharge Instructions (Signed)

## 2015-11-25 ENCOUNTER — Encounter: Payer: Self-pay | Admitting: Emergency Medicine

## 2015-11-25 ENCOUNTER — Emergency Department
Admission: EM | Admit: 2015-11-25 | Discharge: 2015-11-25 | Disposition: A | Discharge status: Home / Self Care | Payer: Medicaid Other | Attending: Emergency Medicine | Admitting: Emergency Medicine

## 2015-11-25 DIAGNOSIS — L271 Localized skin eruption due to drugs and medicaments taken internally: Secondary | ICD-10-CM | POA: Insufficient documentation

## 2015-11-25 DIAGNOSIS — T50905A Adverse effect of unspecified drugs, medicaments and biological substances, initial encounter: Secondary | ICD-10-CM

## 2015-11-25 DIAGNOSIS — Z79899 Other long term (current) drug therapy: Secondary | ICD-10-CM | POA: Insufficient documentation

## 2015-11-25 DIAGNOSIS — T426X5A Adverse effect of other antiepileptic and sedative-hypnotic drugs, initial encounter: Secondary | ICD-10-CM | POA: Diagnosis not present

## 2015-11-25 DIAGNOSIS — I10 Essential (primary) hypertension: Secondary | ICD-10-CM | POA: Diagnosis not present

## 2015-11-25 MED ORDER — HYDROXYZINE HCL 50 MG PO TABS
50.0000 mg | ORAL_TABLET | Freq: Once | ORAL | Status: AC
  Administered 2015-11-25: 50 mg via ORAL
  Filled 2015-11-25: qty 1

## 2015-11-25 MED ORDER — HYDROXYZINE HCL 50 MG PO TABS: 50.0000 mg | ORAL_TABLET | Freq: Three times a day (TID) | ORAL | Status: DC | PRN

## 2015-11-25 MED ORDER — DEXAMETHASONE SODIUM PHOSPHATE 10 MG/ML IJ SOLN
10.0000 mg | Freq: Once | INTRAMUSCULAR | Status: AC
  Administered 2015-11-25: 10 mg via INTRAMUSCULAR
  Filled 2015-11-25: qty 1

## 2015-11-25 MED ORDER — METHYLPREDNISOLONE 4 MG PO TBPK: ORAL_TABLET | ORAL | Status: DC

## 2015-11-25 NOTE — ED Notes (Signed)
Took first dose of gabapentin last night at 1130 - itching rash. Throat not swollen. Did not take any other meds today

## 2015-11-25 NOTE — ED Provider Notes (Signed)
[facility] Emergency Department Provider Note  ____________________________________________  Time seen: Approximately 1:44 PM  I have reviewed the triage vital signs and the nursing notes.   HISTORY  Chief Complaint Rash    HPI Mary Chambers is a 45 y.o. female patient complain of rash and itching status post taken gabapentin 11:30 last night. This was a patient first dose of this medication which was prescribed by her doctor yesterday. Medicine was prescribed for her chronic neck pain. Patient denies any anaphylactic or angioedema signs or symptoms. Patient denies any pain with this complaint. No palliative measures taken for this complaint. Patient notified her family doctor told to come to the emergency room.  Past Medical History  Diagnosis Date  . Hypertension     There are no active problems to display for this patient.   Past Surgical History  Procedure Laterality Date  . Cholecystectomy    . Abdominal hysterectomy    . Tubal ligation    . Lithotripsy    . Neck fusion      Current Outpatient Rx  Name  Route  Sig  Dispense  Refill  . gabapentin (NEURONTIN) 100 MG capsule   Oral   Take 100 mg by mouth 3 (three) times daily.         Marland Kitchen acetaminophen-codeine (TYLENOL #3) 300-30 MG per tablet   Oral   Take 1 tablet by mouth every 8 (eight) hours as needed for moderate pain or severe pain (Do not take other pain medication or drive or operate machinery while taking as can cause drowsiness.). Patient not taking: Reported on 09/29/2015   9 tablet   0   . enalapril (VASOTEC) 10 MG tablet   Oral   Take 10 mg by mouth daily.         . hydrochlorothiazide (HYDRODIURIL) 25 MG tablet   Oral   Take 25 mg by mouth daily.         Marland Kitchen ibuprofen (ADVIL,MOTRIN) 800 MG tablet   Oral   Take 1 tablet (800 mg total) by mouth every 8 (eight) hours as needed for mild pain or moderate pain. Patient not taking: Reported on 09/29/2015   15 tablet    0   . Multiple Vitamin (MULTIVITAMIN WITH MINERALS) TABS tablet   Oral   Take 1 tablet by mouth daily.         . potassium chloride SA (K-DUR,KLOR-CON) 20 MEQ tablet   Oral   Take 20 mEq by mouth 2 (two) times daily.           Allergies Doxycycline; Percocet; and Septra  History reviewed. No pertinent family history.  Social History Social History  Substance Use Topics  . Smoking status: Never Smoker   . Smokeless tobacco: None  . Alcohol Use: No    Review of Systems Constitutional: No fever/chills Eyes: No visual changes. ENT: No sore throat. Cardiovascular: Denies chest pain. Respiratory: Denies shortness of breath. Gastrointestinal: No abdominal pain.  No nausea, no vomiting.  No diarrhea.  No constipation. Genitourinary: Negative for dysuria. Musculoskeletal: Negative for back pain. Skin: Negative for rash. Neurological: Negative for headaches, focal weakness or numbness. Allergic/Immunilogical: Doxycycline, Percocets, Bactrim.    ____________________________________________   PHYSICAL EXAM:  VITAL SIGNS: ED Triage Vitals  Enc Vitals Group     BP 11/25/15 1329 135/84 mmHg     Pulse Rate 11/25/15 1329 113     Resp 11/25/15 1329 20     Temp 11/25/15 1329 98.4 F (  36.9 C)     Temp Source 11/25/15 1329 Oral     SpO2 11/25/15 1329 95 %     Weight 11/25/15 1329 260 lb (117.935 kg)     Height 11/25/15 1329 5\' 10"  (1.778 m)     Head Cir --      Peak Flow --      Pain Score --      Pain Loc --      Pain Edu? --      Excl. in [place]? --     Constitutional: Alert and oriented. Well appearing and in no acute distress. Morbid obesity Eyes: Conjunctivae are normal. PERRL. EOMI. Head: Atraumatic. Nose: No congestion/rhinnorhea. Mouth/Throat: Mucous membranes are moist.  Oropharynx non-erythematous. Neck: No stridor.  No cervical spine tenderness to palpation. Hematological/Lymphatic/Immunilogical: No cervical lymphadenopathy. Cardiovascular: Normal rate,  regular rhythm. Grossly normal heart sounds.  Good peripheral circulation. Respiratory: Normal respiratory effort.  No retractions. Lungs CTAB. Gastrointestinal: Soft and nontender. No distention. No abdominal bruits. No CVA tenderness. Musculoskeletal: No lower extremity tenderness nor edema.  No joint effusions. Neurologic:  Normal speech and language. No gross focal neurologic deficits are appreciated. No gait instability. Skin:  Skin is warm, dry and intact. Erythematous macular lesions facial neck and upper chest. Psychiatric: Mood and affect are normal. Speech and behavior are normal.  ____________________________________________   LABS (all labs ordered are listed, but only abnormal results are displayed)  Labs Reviewed - No data to display ____________________________________________  EKG   ____________________________________________  RADIOLOGY   ____________________________________________   PROCEDURES  Procedure(s) performed: None  Critical Care performed: No  ____________________________________________   INITIAL IMPRESSION / ASSESSMENT AND PLAN / ED COURSE  Pertinent labs & imaging results that were available during my care of the patient were reviewed by me and considered in my medical decision making (see chart for details).  Medication reaction. Patient advised to discontinue gabapentin and follow-up family doctor. Patient given Decadron and Atarax in the ER. Patient discharged prescription for Medrol Dosepak and Atarax. ____________________________________________   FINAL CLINICAL IMPRESSION(S) / ED DIAGNOSES  Final diagnoses:  Medication reaction, initial encounter      Sable Feil, PA-C 11/25/15 [place], MD 11/25/15 1535

## 2015-11-25 NOTE — Discharge Instructions (Signed)
Discontinue gabapentin follow-up family doctor.

## 2015-12-01 DIAGNOSIS — D72829 Elevated white blood cell count, unspecified: Secondary | ICD-10-CM | POA: Insufficient documentation

## 2016-02-02 DIAGNOSIS — G894 Chronic pain syndrome: Secondary | ICD-10-CM

## 2016-02-02 DIAGNOSIS — Z981 Arthrodesis status: Secondary | ICD-10-CM

## 2016-02-02 HISTORY — DX: Arthrodesis status: Z98.1

## 2016-02-02 HISTORY — DX: Chronic pain syndrome: G89.4

## 2016-02-14 ENCOUNTER — Emergency Department: Payer: Medicaid Other

## 2016-02-14 ENCOUNTER — Encounter: Payer: Self-pay | Admitting: Emergency Medicine

## 2016-02-14 ENCOUNTER — Emergency Department
Admission: EM | Admit: 2016-02-14 | Discharge: 2016-02-14 | Disposition: A | Discharge status: Home / Self Care | Payer: Medicaid Other | Attending: Emergency Medicine | Admitting: Emergency Medicine

## 2016-02-14 DIAGNOSIS — Y9289 Other specified places as the place of occurrence of the external cause: Secondary | ICD-10-CM | POA: Diagnosis not present

## 2016-02-14 DIAGNOSIS — W19XXXA Unspecified fall, initial encounter: Secondary | ICD-10-CM | POA: Diagnosis not present

## 2016-02-14 DIAGNOSIS — M7712 Lateral epicondylitis, left elbow: Secondary | ICD-10-CM | POA: Diagnosis not present

## 2016-02-14 DIAGNOSIS — Z79899 Other long term (current) drug therapy: Secondary | ICD-10-CM | POA: Insufficient documentation

## 2016-02-14 DIAGNOSIS — I10 Essential (primary) hypertension: Secondary | ICD-10-CM | POA: Diagnosis not present

## 2016-02-14 DIAGNOSIS — Y939 Activity, unspecified: Secondary | ICD-10-CM | POA: Diagnosis not present

## 2016-02-14 DIAGNOSIS — S4992XA Unspecified injury of left shoulder and upper arm, initial encounter: Secondary | ICD-10-CM | POA: Diagnosis present

## 2016-02-14 DIAGNOSIS — Y999 Unspecified external cause status: Secondary | ICD-10-CM | POA: Diagnosis not present

## 2016-02-14 IMAGING — DX DG FOREARM 2V*L*
3 series · 3 of 3 positions shown · non-contrast
Comparison: Wrist radiograph [DATE]

CLINICAL DATA: Pain to left forearm, status post fall 1-2 weeks
ago.

EXAM:
LEFT FOREARM - 2 VIEW

[forearm ap (1 of 2)]
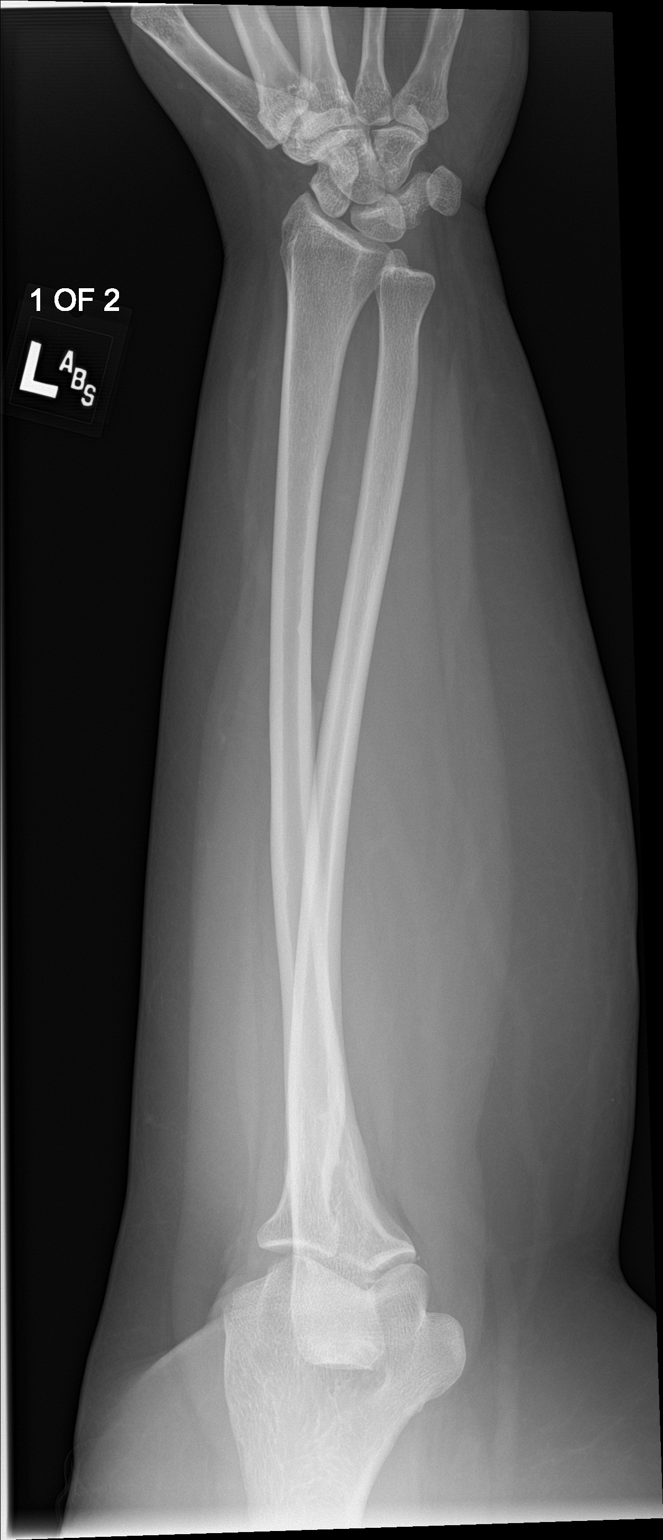

[forearm lat]
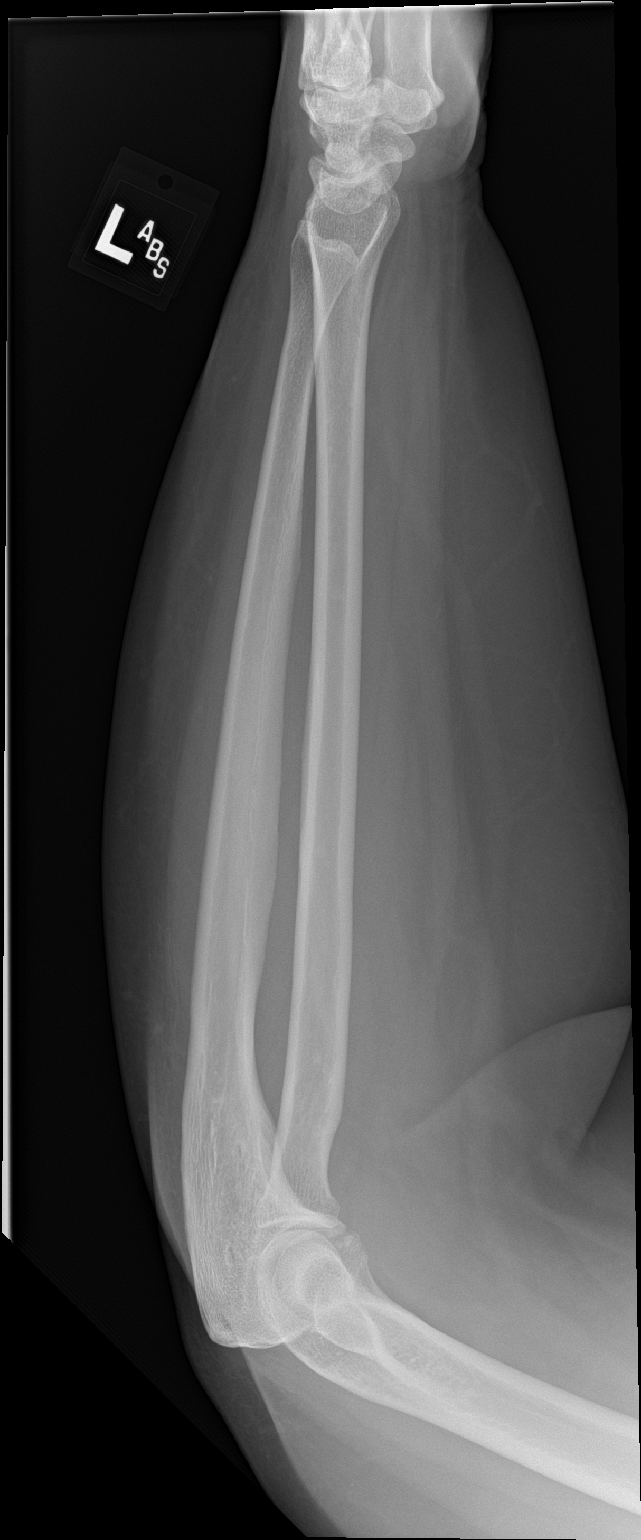

[forearm ap (2 of 2)]
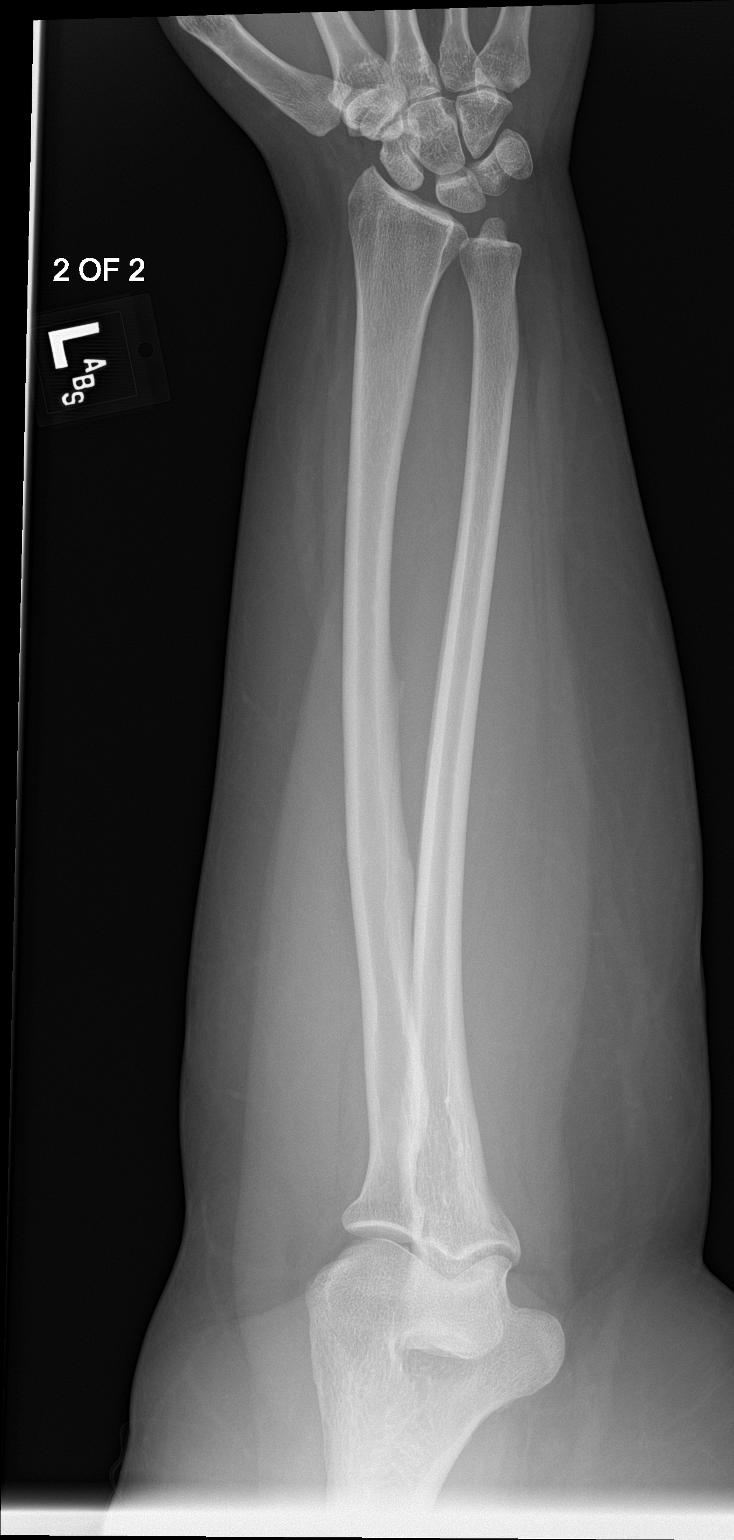

[3 of 3 positions shown; findings below may reference images not displayed]

FINDINGS: There is no evidence of fracture or other focal bone lesions. There
is stable widening of the scapholunate joint. Soft tissues are
unremarkable.
IMPRESSION: No evidence of acute fracture.

Stable widening of the scapholunate joint.

## 2016-02-14 MED ORDER — SODIUM CHLORIDE 0.9 % IV BOLUS (SEPSIS): 1000.0000 mL | Freq: Once | INTRAVENOUS | Status: DC

## 2016-02-14 MED ORDER — DEXAMETHASONE SODIUM PHOSPHATE 10 MG/ML IJ SOLN: 10.0000 mg | Freq: Once | INTRAMUSCULAR | Status: DC

## 2016-02-14 MED ORDER — ETODOLAC 400 MG PO TABS: 400.0000 mg | ORAL_TABLET | Freq: Two times a day (BID) | ORAL | Status: DC

## 2016-02-14 MED ORDER — SODIUM CHLORIDE 0.9 % IV SOLN: 3.0000 g | Freq: Once | INTRAVENOUS | Status: DC

## 2016-02-14 NOTE — ED Provider Notes (Signed)
[facility] Emergency Department Provider Note   ____________________________________________  Time seen: Approximately 9:23 AM  I have reviewed the triage vital signs and the nursing notes.   HISTORY  Chief Complaint Arm Injury   HPI Mary Chambers is a 45 y.o. female is here with complaint of left arm pain. Patient states that she fell approximately 1-1/2-2 weeks ago at a wedding reception. It was a mechanical fall and not a syncopal episode. Patient is continue to have pain in her left forearm. She has not had any relief with over-the-counter medications. Patient states that she has increased pain with gripping or moving. She denies any previous fractures in this arm. Currently she rates her pain as 6/10.   Past Medical History  Diagnosis Date  . Hypertension     There are no active problems to display for this patient.   Past Surgical History  Procedure Laterality Date  . Cholecystectomy    . Abdominal hysterectomy    . Tubal ligation    . Lithotripsy    . Neck fusion    . Carpal tunnel release      Current Outpatient Rx  Name  Route  Sig  Dispense  Refill  . enalapril (VASOTEC) 10 MG tablet   Oral   Take 10 mg by mouth daily.         Marland Kitchen etodolac (LODINE) 400 MG tablet   Oral   Take 1 tablet (400 mg total) by mouth 2 (two) times daily.   20 tablet   0   . gabapentin (NEURONTIN) 100 MG capsule   Oral   Take 100 mg by mouth 3 (three) times daily.         . hydrochlorothiazide (HYDRODIURIL) 25 MG tablet   Oral   Take 25 mg by mouth daily.         . Multiple Vitamin (MULTIVITAMIN WITH MINERALS) TABS tablet   Oral   Take 1 tablet by mouth daily.         . potassium chloride SA (K-DUR,KLOR-CON) 20 MEQ tablet   Oral   Take 20 mEq by mouth 2 (two) times daily.           Allergies Doxycycline; Percocet; Septra; and Gabapentin  No family history on file.  Social History Social History  Substance Use Topics  .  Smoking status: Never Smoker   . Smokeless tobacco: None  . Alcohol Use: No    Review of Systems Constitutional: No fever/chills Eyes: No visual changes. ENT: No trauma. Cardiovascular: Denies chest pain. Respiratory: Denies shortness of breath. Gastrointestinal:  No nausea, no vomiting.   Musculoskeletal: Negative for back pain. Positive left forearm pain. Skin: Negative for rash. Neurological: Negative for headaches, focal weakness or numbness.  10-point ROS otherwise negative.  ____________________________________________   PHYSICAL EXAM:  VITAL SIGNS: ED Triage Vitals  Enc Vitals Group     BP 02/14/16 0824 116/72 mmHg     Pulse Rate 02/14/16 0824 72     Resp 02/14/16 0824 16     Temp 02/14/16 0824 97.7 F (36.5 C)     Temp src --      SpO2 02/14/16 0824 97 %     Weight 02/14/16 0824 276 lb (125.193 kg)     Height 02/14/16 0824 5\' 10"  (1.778 m)     Head Cir --      Peak Flow --      Pain Score 02/14/16 0825 6     Pain  Loc --      Pain Edu? --      Excl. in [place]? --     Constitutional: Alert and oriented. Well appearing and in no acute distress. Eyes: Conjunctivae are normal. PERRL. EOMI. Head: Atraumatic. Nose: No congestion/rhinnorhea. Neck: No stridor.  No cervical tenderness on palpation posteriorly. Range of motion without any restrictions or pain. Cardiovascular: Normal rate, regular rhythm. Grossly normal heart sounds.  Good peripheral circulation. Respiratory: Normal respiratory effort.  No retractions. Lungs CTAB. Gastrointestinal: Soft and nontender. No distention.  Musculoskeletal: Examination of left forearm there is no gross deformity. There is moderate tenderness on palpation of the lateral epicondyle. Range of motion is slightly restricted secondary to pain. Patient is able to flex and extend with discomfort. Patient is able rotate with increased discomfort. There is also increased pain with grip of the left hand. There is point tenderness on  palpation of the lateral epicondyle. Neurologic:  Normal speech and language. No gross focal neurologic deficits are appreciated. No gait instability. Skin:  Skin is warm, dry and intact. No rash noted. Psychiatric: Mood and affect are normal. Speech and behavior are normal.  ____________________________________________   LABS (all labs ordered are listed, but only abnormal results are displayed)  Labs Reviewed - No data to display ____________________________________________  EKG  Deferred ____________________________________________  RADIOLOGY  Left forearm per radiologist no evidence of acute fracture. I, [name], personally viewed and evaluated these images (plain radiographs) as part of my medical decision making, as well as reviewing the written report by the radiologist. ____________________________________________   PROCEDURES  Procedure(s) performed: None  Critical Care performed: No  ____________________________________________   INITIAL IMPRESSION / ASSESSMENT AND PLAN / ED COURSE  Pertinent labs & imaging results that were available during my care of the patient were reviewed by me and considered in my medical decision making (see chart for details).  Patient was encouraged to obtain a tennis elbow brace at the local stores. Patient was given a prescription for etodolac 400 mg twice a day with food. Patient is follow-up with Dr. Marry Guan or [place] [facility] if any continued problems. ____________________________________________   FINAL CLINICAL IMPRESSION(S) / ED DIAGNOSES  Final diagnoses:  Left lateral epicondylitis      NEW MEDICATIONS STARTED DURING THIS VISIT:  Discharge Medication List as of 02/14/2016  9:32 AM    START taking these medications   Details  etodolac (LODINE) 400 MG tablet Take 1 tablet (400 mg total) by mouth 2 (two) times daily., Starting 02/14/2016, Until Discontinued, Print         Note:  This document was prepared  using Dragon voice recognition software and may include unintentional dictation errors.    [name], PA-C 02/14/16 1453  Lisa Roca, MD 02/15/16 [phone]

## 2016-02-14 NOTE — ED Notes (Signed)
States she fell about 1-2 weeks ago  Landed on left arm  conts to have pain to left forearm no swelling noted  Positive pulses noted

## 2016-02-14 NOTE — Discharge Instructions (Signed)
Tennis Elbow Tennis elbow is puffiness (inflammation) of the outer tendons of your forearm close to your elbow. Your tendons attach your muscles to your bones. Tennis elbow can happen in any sport or job in which you use your elbow too much. It is caused by doing the same motion over and over. Tennis elbow can cause:  Pain and tenderness in your forearm and the outer part of your elbow.  A burning feeling. This runs from your elbow through your arm.  Weak grip in your hands. HOME CARE Activity  Rest your elbow and wrist as told by your doctor. Try to avoid any activities that caused the problem until your doctor says that you can do them again.  If a physical therapist teaches you exercises, do all of them as told.  If you lift an object, lift it with your palm facing up. This is easier on your elbow. Lifestyle  If your tennis elbow is caused by sports, check your equipment and make sure that:  You are using it correctly.  It fits you well.  If your tennis elbow is caused by work, take breaks often, if you are able. Talk with your manager about doing your work in a way that is safe for you.  If your tennis elbow is caused by computer use, talk with your manager about any changes that can be made to your work setup. General Instructions  If told, apply ice to the painful area:  Put ice in a plastic bag.  Place a towel between your skin and the bag.  Leave the ice on for 20 minutes, 2-3 times per day.  Take medicines only as told by your doctor.  If you were given a brace, wear it as told by your doctor.  Keep all follow-up visits as told by your doctor. This is important. GET HELP IF:  Your pain does not get better with treatment.  Your pain gets worse.  You have weakness in your forearm, hand, or fingers.  You cannot feel your forearm, hand, or fingers.   This information is not intended to replace advice given to you by your health care provider. Make sure you  discuss any questions you have with your health care provider.   Document Released: 03/08/2010 Document Revised: 02/02/2015 Document Reviewed: 09/14/2014 Elsevier Interactive Patient Education 2016 [place] tennis elbow brace at any store that sells orthopedic supplies. [employer], [employer], [employer]. Ice to elbow when available. Begin taking etodolac 400 mg twice a day with food. Follow-up with Dr. Marry Guan if any continued problems with your elbow.
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
## 2016-03-10 DIAGNOSIS — M4714 Other spondylosis with myelopathy, thoracic region: Secondary | ICD-10-CM | POA: Insufficient documentation

## 2016-03-10 HISTORY — DX: Other spondylosis with myelopathy, thoracic region: M47.14

## 2016-05-04 DIAGNOSIS — R519 Headache, unspecified: Secondary | ICD-10-CM | POA: Insufficient documentation

## 2016-05-04 DIAGNOSIS — R51 Headache: Secondary | ICD-10-CM

## 2016-05-04 DIAGNOSIS — R5383 Other fatigue: Secondary | ICD-10-CM | POA: Insufficient documentation

## 2016-05-04 DIAGNOSIS — R200 Anesthesia of skin: Secondary | ICD-10-CM | POA: Insufficient documentation

## 2016-08-01 DIAGNOSIS — M5116 Intervertebral disc disorders with radiculopathy, lumbar region: Secondary | ICD-10-CM | POA: Insufficient documentation

## 2017-07-13 DIAGNOSIS — M179 Osteoarthritis of knee, unspecified: Secondary | ICD-10-CM | POA: Insufficient documentation

## 2017-07-13 DIAGNOSIS — M1710 Unilateral primary osteoarthritis, unspecified knee: Secondary | ICD-10-CM | POA: Insufficient documentation

## 2017-09-17 ENCOUNTER — Other Ambulatory Visit: Payer: Self-pay | Admitting: Orthopedic Surgery

## 2017-09-17 DIAGNOSIS — M160 Bilateral primary osteoarthritis of hip: Secondary | ICD-10-CM

## 2017-09-19 ENCOUNTER — Ambulatory Visit
Admission: RE | Admit: 2017-09-19 | Discharge: 2017-09-19 | Disposition: A | Discharge status: Home / Self Care | Payer: Medicaid Other | Source: Ambulatory Visit | Attending: Orthopedic Surgery | Admitting: Orthopedic Surgery

## 2017-09-19 DIAGNOSIS — M160 Bilateral primary osteoarthritis of hip: Secondary | ICD-10-CM | POA: Diagnosis present

## 2017-09-19 IMAGING — RF DG FLUORO GUIDE NDL PLC/BX
1 series · 1 of 1 positions shown · non-contrast
Comparison: none

CLINICAL DATA: Therapeutic injection of the right hip joint due to
osteoarthritis.

[Series 1: fluoro_iodine 2fps_bw · 0.17mm/px · 1 of 1 slices shown]
[im 1/1]
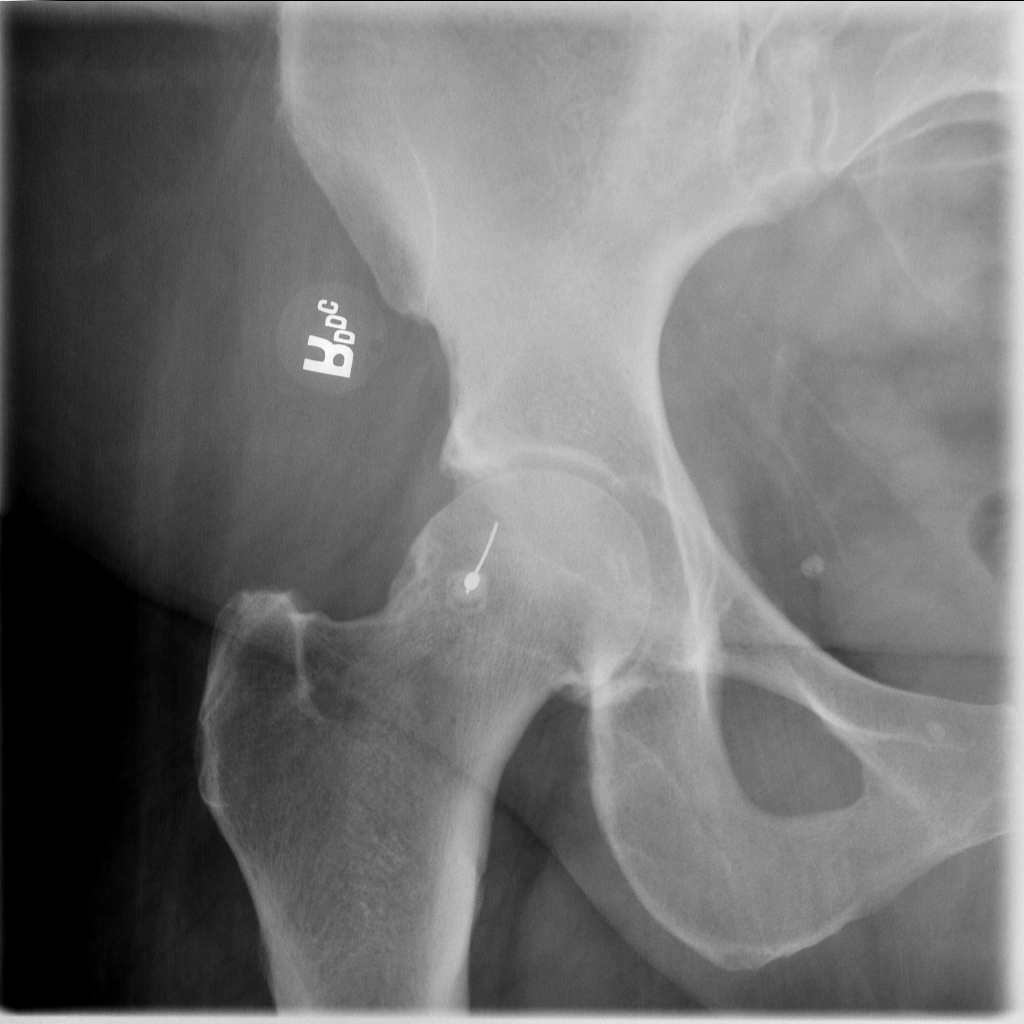

[1 of 1 positions shown; findings below may reference images not displayed]

EXAM:
Right HIP INJECTION UNDER FLUOROSCOPY

FLUOROSCOPY TIME:  Fluoroscopy Time:  0 minutes, 48 seconds

Radiation Exposure Index (if provided by the fluoroscopic device):
235 micro Gy per meters square

Number of Acquired Spot Images: 3; however, the 2 later images were
not saved.

PROCEDURE:
The anticipated procedure was discussed with the patient. Risks
including bleeding, infection, and increased pain were discussed and
their management outlined. There was no contraindication to the use
of cutaneous antiseptic is, injectable lidocaine, or injectable
contrast or steroids. The patient voiced her willingness to proceed.

A time-out procedure was called. The skin over the right thigh was
marked with an indelible marker. Overlying skin prepped with
Betadine, draped in the usual sterile fashion, and infiltrated
locally with 5 cc of Lidocaine. A 22 gauge spinal needle advanced to
the superolateral margin of the right femoral head. Diagnostic
injection of approximately 5 cc of Isovue 200 demonstrates
intra-articular spread without intravascular component.

A 80mg Depo-Medrol and 6 cc of bupivacaine 0.5% were then
administered. No immediate complication.
IMPRESSION: The patient tolerated the procedure well.

Technically successful right hip injection under fluoroscopy for
therapeutic purposes.

## 2017-09-19 MED ORDER — LIDOCAINE HCL (PF) 1 % IJ SOLN
5.0000 mL | Freq: Once | INTRAMUSCULAR | Status: AC
  Administered 2017-09-19: 5 mL
  Filled 2017-09-19: qty 5

## 2017-09-19 MED ORDER — IOPAMIDOL (ISOVUE-200) INJECTION 41%
50.0000 mL | Freq: Once | INTRAVENOUS | Status: AC | PRN
  Administered 2017-09-19: 10 mL
  Filled 2017-09-19: qty 50

## 2017-09-19 MED ORDER — BUPIVACAINE HCL (PF) 0.25 % IJ SOLN
30.0000 mL | Freq: Once | INTRAMUSCULAR | Status: AC
  Administered 2017-09-19: 10 mL via INTRA_ARTICULAR
  Filled 2017-09-19: qty 30

## 2017-09-19 MED ORDER — METHYLPREDNISOLONE ACETATE 40 MG/ML INJ SUSP (RADIOLOG
80.0000 mg | Freq: Once | INTRAMUSCULAR | Status: AC
  Administered 2017-09-19: 80 mg via INTRA_ARTICULAR

## 2017-10-15 DIAGNOSIS — M7138 Other bursal cyst, other site: Secondary | ICD-10-CM | POA: Insufficient documentation

## 2018-02-13 DIAGNOSIS — G43909 Migraine, unspecified, not intractable, without status migrainosus: Secondary | ICD-10-CM | POA: Insufficient documentation

## 2018-02-13 DIAGNOSIS — G8929 Other chronic pain: Secondary | ICD-10-CM | POA: Insufficient documentation

## 2018-02-13 DIAGNOSIS — M549 Dorsalgia, unspecified: Secondary | ICD-10-CM

## 2018-02-13 DIAGNOSIS — M1990 Unspecified osteoarthritis, unspecified site: Secondary | ICD-10-CM | POA: Insufficient documentation

## 2018-02-13 DIAGNOSIS — Z6841 Body Mass Index (BMI) 40.0 and over, adult: Secondary | ICD-10-CM

## 2018-02-13 HISTORY — DX: Other chronic pain: G89.29

## 2018-03-29 DIAGNOSIS — M79672 Pain in left foot: Secondary | ICD-10-CM | POA: Insufficient documentation

## 2018-04-22 ENCOUNTER — Ambulatory Visit (INDEPENDENT_AMBULATORY_CARE_PROVIDER_SITE_OTHER): Payer: Medicaid Other

## 2018-04-22 ENCOUNTER — Encounter: Payer: Self-pay | Admitting: Podiatry

## 2018-04-22 ENCOUNTER — Ambulatory Visit (INDEPENDENT_AMBULATORY_CARE_PROVIDER_SITE_OTHER): Payer: Medicaid Other | Admitting: Podiatry

## 2018-04-22 DIAGNOSIS — M722 Plantar fascial fibromatosis: Secondary | ICD-10-CM

## 2018-04-22 DIAGNOSIS — M7751 Other enthesopathy of right foot: Secondary | ICD-10-CM

## 2018-04-22 DIAGNOSIS — M2142 Flat foot [pes planus] (acquired), left foot: Secondary | ICD-10-CM

## 2018-04-22 DIAGNOSIS — M7752 Other enthesopathy of left foot: Secondary | ICD-10-CM | POA: Diagnosis not present

## 2018-04-22 DIAGNOSIS — M779 Enthesopathy, unspecified: Secondary | ICD-10-CM

## 2018-04-22 DIAGNOSIS — M2141 Flat foot [pes planus] (acquired), right foot: Secondary | ICD-10-CM

## 2018-04-22 MED ORDER — METHYLPREDNISOLONE 4 MG PO TBPK: ORAL_TABLET | ORAL | 0 refills | Status: DC

## 2018-04-22 NOTE — Progress Notes (Addendum)
Subjective:  Patient ID: Mary Chambers, female    DOB: Sep 28, 1971,  MRN: [id number] HPI Chief Complaint  Patient presents with  . Foot Pain    Patient presents for bilat foot pain and left ankle swelling x 6 months.  She reports burning and stinging pains that come and goes, and is worse when walking.  She reports pains are mostly in the heels.  She takes tylenol arthritis and elevates with slight relief    47 y.o. female presents with the above complaint.   ROS: Denies fever chills nausea vomiting muscle aches pains.  Has had 6 back surgeries to her back hurts some.  Past Medical History:  Diagnosis Date  . Hypertension    Past Surgical History:  Procedure Laterality Date  . ABDOMINAL HYSTERECTOMY    . CARPAL TUNNEL RELEASE    . CHOLECYSTECTOMY    . LITHOTRIPSY    . neck fusion    . TUBAL LIGATION      Current Outpatient Medications:  .  diclofenac sodium (VOLTAREN) 1 % GEL, Apply topically., Disp: , Rfl:  .  diphenhydrAMINE (BENADRYL) 25 mg capsule, Take by mouth., Disp: , Rfl:  .  fluticasone (FLONASE) 50 MCG/ACT nasal spray, fluticasone propionate 50 mcg/actuation nasal spray,suspension, Disp: , Rfl:  .  enalapril (VASOTEC) 10 MG tablet, Take 10 mg by mouth daily., Disp: , Rfl:  .  hydrochlorothiazide (HYDRODIURIL) 25 MG tablet, Take 25 mg by mouth daily., Disp: , Rfl:  .  LYRICA 50 MG capsule, Take 50 mg by mouth 3 (three) times daily., Disp: , Rfl: 2 .  meloxicam (MOBIC) 15 MG tablet, Take 15 mg by mouth daily., Disp: , Rfl: 2 .  methylPREDNISolone (MEDROL DOSEPAK) 4 MG TBPK tablet, 6 day dose pack - take as directed, Disp: 21 tablet, Rfl: 0 .  Multiple Vitamin (MULTIVITAMIN) capsule, Take by mouth., Disp: , Rfl:  .  naproxen (NAPROSYN) 500 MG tablet, naproxen 500 mg tablet  Take 1 tablet twice a day by oral route., Disp: , Rfl:  .  potassium chloride SA (K-DUR,KLOR-CON) 20 MEQ tablet, Take 20 mEq by mouth 2 (two) times daily., Disp: , Rfl:   Allergies  Allergen  Reactions  . Baclofen Other (See Comments)  . Doxycycline Itching  . Percocet [Oxycodone-Acetaminophen] Hives  . Septra [Sulfamethoxazole-Trimethoprim] Hives  . Gabapentin Rash   Review of Systems Objective:  There were no vitals filed for this visit.  General: Well developed, nourished, in no acute distress, alert and oriented x3   Dermatological: Skin is warm, dry and supple bilateral. Nails x 10 are well maintained; remaining integument appears unremarkable at this time. There are no open sores, no preulcerative lesions, no rash or signs of infection present.  Vascular: Dorsalis Pedis artery and Posterior Tibial artery pedal pulses are 2/4 bilateral with immedate capillary fill time. Pedal hair growth present. No varicosities and no lower extremity edema present bilateral.   Neruologic: Grossly intact via light touch bilateral. Vibratory intact via tuning fork bilateral. Protective threshold with Semmes Wienstein monofilament intact to all pedal sites bilateral. Patellar and Achilles deep tendon reflexes 2+ bilateral. No Babinski or clonus noted bilateral.   Musculoskeletal: No gross boney pedal deformities bilateral. No pain, crepitus, or limitation noted with foot and ankle range of motion bilateral. Muscular strength 5/5 in all groups tested bilateral.  Severe predatory foot type with pain on palpation medial calcaneal tubercle bilateral heels left greater than right.  Gait: Unassisted, Nonantalgic.    Radiographs:  Radiographs demonstrate  pes planus with sharp distally oriented calcaneal heel spurs soft tissue increase in density plantar fashion calcaneal insertion site significant osteoarthritic changes around the midfoot.  No acute findings are noted.  Assessment & Plan:   Assessment: Plantar fasciitis bilateral edema bilateral severe pronation bilateral  Plan: Discussed etiology pathology conservative versus surgical therapies.  At this point I will start her on a Medrol  Dosepak and injected bilateral heels with 20 mg Kenalog 5 mg Marcaine point maximal tenderness bilateral foot.  Discussed with patient the need for bracing for severe pronation bilaterally.    Jyssica Rief T. [place], [place]

## 2018-04-26 ENCOUNTER — Telehealth: Payer: Self-pay | Admitting: *Deleted

## 2018-04-26 NOTE — Telephone Encounter (Signed)
I informed pt to call [facility] to set up appt. I faxed over Rx and chart notes for insurance verification.

## 2018-06-05 ENCOUNTER — Ambulatory Visit: Payer: Medicaid Other | Admitting: Podiatry

## 2018-06-25 DIAGNOSIS — G4730 Sleep apnea, unspecified: Secondary | ICD-10-CM

## 2018-06-25 HISTORY — DX: Sleep apnea, unspecified: G47.30

## 2018-07-03 ENCOUNTER — Ambulatory Visit: Payer: Medicaid Other | Admitting: Podiatry

## 2018-07-17 DIAGNOSIS — K259 Gastric ulcer, unspecified as acute or chronic, without hemorrhage or perforation: Secondary | ICD-10-CM | POA: Insufficient documentation

## 2018-11-30 ENCOUNTER — Emergency Department (HOSPITAL_BASED_OUTPATIENT_CLINIC_OR_DEPARTMENT_OTHER)
Admission: EM | Admit: 2018-11-30 | Discharge: 2018-11-30 | Disposition: A | Discharge status: Home / Self Care | Payer: Medicaid Other | Attending: Emergency Medicine | Admitting: Emergency Medicine

## 2018-11-30 ENCOUNTER — Other Ambulatory Visit: Payer: Self-pay

## 2018-11-30 ENCOUNTER — Encounter (HOSPITAL_BASED_OUTPATIENT_CLINIC_OR_DEPARTMENT_OTHER): Payer: Self-pay | Admitting: *Deleted

## 2018-11-30 DIAGNOSIS — X101XXA Contact with hot food, initial encounter: Secondary | ICD-10-CM | POA: Insufficient documentation

## 2018-11-30 DIAGNOSIS — T24032A Burn of unspecified degree of left lower leg, initial encounter: Secondary | ICD-10-CM | POA: Insufficient documentation

## 2018-11-30 DIAGNOSIS — Z5189 Encounter for other specified aftercare: Secondary | ICD-10-CM

## 2018-11-30 DIAGNOSIS — F329 Major depressive disorder, single episode, unspecified: Secondary | ICD-10-CM | POA: Diagnosis not present

## 2018-11-30 DIAGNOSIS — Z9049 Acquired absence of other specified parts of digestive tract: Secondary | ICD-10-CM | POA: Diagnosis not present

## 2018-11-30 DIAGNOSIS — Y9389 Activity, other specified: Secondary | ICD-10-CM | POA: Insufficient documentation

## 2018-11-30 DIAGNOSIS — Z79899 Other long term (current) drug therapy: Secondary | ICD-10-CM | POA: Insufficient documentation

## 2018-11-30 DIAGNOSIS — Y998 Other external cause status: Secondary | ICD-10-CM | POA: Insufficient documentation

## 2018-11-30 DIAGNOSIS — Y929 Unspecified place or not applicable: Secondary | ICD-10-CM | POA: Diagnosis not present

## 2018-11-30 DIAGNOSIS — F419 Anxiety disorder, unspecified: Secondary | ICD-10-CM | POA: Diagnosis not present

## 2018-11-30 DIAGNOSIS — I10 Essential (primary) hypertension: Secondary | ICD-10-CM | POA: Insufficient documentation

## 2018-11-30 HISTORY — DX: Calculus of kidney: N20.0

## 2018-11-30 MED ORDER — BACITRACIN ZINC 500 UNIT/GM EX OINT: TOPICAL_OINTMENT | Freq: Two times a day (BID) | CUTANEOUS | Status: DC
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
## 2018-11-30 NOTE — Discharge Instructions (Addendum)
You were given a referral to the wound clinic. Please call the office to make an appointment for follow up in the next week. Apply bacitracin to the wound twice daily.   Please call your neurosurgeon in regards to your other symptoms today.  Please return to the emergency room immediately if you experience any new or worsening symptoms or any symptoms that indicate worsening infection such as fevers, increased redness/swelling/pain, warmth, or drainage from the affected area.
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
## 2018-11-30 NOTE — ED Notes (Signed)
ED Provider at bedside. 

## 2019-04-16 ENCOUNTER — Ambulatory Visit (HOSPITAL_COMMUNITY)
Admission: EM | Admit: 2019-04-16 | Discharge: 2019-04-16 | Disposition: A | Discharge status: Home / Self Care | Payer: Medicaid Other | Attending: Family Medicine | Admitting: Family Medicine

## 2019-04-16 ENCOUNTER — Encounter (HOSPITAL_COMMUNITY): Payer: Self-pay | Admitting: Emergency Medicine

## 2019-04-16 ENCOUNTER — Other Ambulatory Visit: Payer: Self-pay

## 2019-04-16 DIAGNOSIS — N3941 Urge incontinence: Secondary | ICD-10-CM | POA: Diagnosis not present

## 2019-04-16 LAB — POCT URINALYSIS DIP (DEVICE)
Bilirubin Urine: NEGATIVE
Glucose, UA: NEGATIVE mg/dL
Hgb urine dipstick: NEGATIVE
Ketones, ur: NEGATIVE mg/dL
Leukocytes,Ua: NEGATIVE
Nitrite: NEGATIVE
Protein, ur: NEGATIVE mg/dL
Specific Gravity, Urine: 1.025 (ref 1.005–1.030)
Urobilinogen, UA: 0.2 mg/dL (ref 0.0–1.0)
pH: 7 (ref 5.0–8.0)

## 2019-04-16 MED ORDER — TOLTERODINE TARTRATE ER 4 MG PO CP24: 4.0000 mg | ORAL_CAPSULE | Freq: Every day | ORAL | 0 refills | Status: DC

## 2019-04-16 NOTE — ED Provider Notes (Signed)
[place]    ASSESSMENT & PLAN:  1. Urge incontinence    Trial of: Meds ordered this encounter  Medications  . tolterodine (DETROL LA) 4 MG 24 hr capsule    Sig: Take 1 capsule (4 mg total) by mouth daily.    Dispense:  30 capsule    Refill:  0   Recommend urology follow up. No signs of pyelonephritis or UTI. Discussed.  May follow up here as needed. Outlined signs and symptoms indicating need for more acute intervention. Patient verbalized understanding. After Visit Summary given.  SUBJECTIVE:  Mary Chambers is a 48 y.o. female who complains of episodes of abrupt urinary urgency and inability to make it to toilet before leaking urine. Started noticing over the past 1-2 days. No specific aggravating or alleviating factors reported. No new medications. Minimal caffeine use. Otherwise feeling well. No specific dysuria or hematuria. No frequent thirst. Normal PO intake without n/v. Without abdominal or back pain. No self treatment reported. Ambulatory without difficulty.  LMP: No LMP recorded. Patient has had a hysterectomy.  ROS: As in HPI. All other systems negative.   OBJECTIVE:  Vitals:   04/16/19 1955  BP: (!) 137/93  Pulse: 76  Resp: 14  Temp: 97.7 F (36.5 C)  TempSrc: Oral  SpO2: 96%   General appearance: alert; no distress HENT: oropharynx: moist Lungs: unlabored respirations Abdomen: soft, non-tender; bowel sounds normal; no masses or organomegaly; no guarding or rebound tenderness Back: no CVA tenderness Extremities: no edema; symmetrical with no gross deformities Skin: warm and dry Neurologic: normal gait Psychological: alert and cooperative; normal mood and affect  Labs Reviewed - No data to display  Allergies  Allergen Reactions  . Baclofen Other (See Comments)  . Doxycycline Itching  . Percocet [Oxycodone-Acetaminophen] Hives  . Septra [Sulfamethoxazole-Trimethoprim] Hives  . Gabapentin Rash    Past Medical History:   Diagnosis Date  . Hypertension   . Kidney stone    Social History   Socioeconomic History  . Marital status: Single    Spouse name: Not on file  . Number of children: Not on file  . Years of education: Not on file  . Highest education level: Not on file  Occupational History  . Not on file  Social Needs  . Financial resource strain: Not on file  . Food insecurity    Worry: Not on file    Inability: Not on file  . Transportation needs    Medical: Not on file    Non-medical: Not on file  Tobacco Use  . Smoking status: Never Smoker  . Smokeless tobacco: Never Used  Substance and Sexual Activity  . Alcohol use: No  . Drug use: Never  . Sexual activity: Not on file  Lifestyle  . Physical activity    Days per week: Not on file    Minutes per session: Not on file  . Stress: Not on file  Relationships  . Social Herbalist on phone: Not on file    Gets together: Not on file    Attends religious service: Not on file    Active member of club or organization: Not on file    Attends meetings of clubs or organizations: Not on file    Relationship status: Not on file  . Intimate partner violence    Fear of current or ex partner: Not on file    Emotionally abused: Not on file    Physically abused: Not on file  Forced sexual activity: Not on file  Other Topics Concern  . Not on file  Social History Narrative  . Not on file   No family history on file.     Vanessa Kick, MD 04/19/19 [phone]

## 2019-04-16 NOTE — ED Triage Notes (Signed)
PT reports dysuria and bladder spasms started yesterday.

## 2019-05-06 ENCOUNTER — Ambulatory Visit (HOSPITAL_COMMUNITY)
Admission: EM | Admit: 2019-05-06 | Discharge: 2019-05-06 | Disposition: A | Discharge status: Home / Self Care | Payer: Medicaid Other | Attending: Emergency Medicine | Admitting: Emergency Medicine

## 2019-05-06 ENCOUNTER — Encounter (HOSPITAL_COMMUNITY): Payer: Self-pay | Admitting: *Deleted

## 2019-05-06 DIAGNOSIS — J029 Acute pharyngitis, unspecified: Secondary | ICD-10-CM | POA: Insufficient documentation

## 2019-05-06 DIAGNOSIS — H9202 Otalgia, left ear: Secondary | ICD-10-CM | POA: Diagnosis present

## 2019-05-06 LAB — POCT RAPID STREP A: Streptococcus, Group A Screen (Direct): NEGATIVE

## 2019-05-06 MED ORDER — CETIRIZINE HCL 10 MG PO CHEW: 10.0000 mg | CHEWABLE_TABLET | Freq: Every day | ORAL | 0 refills | Status: DC

## 2019-05-06 MED ORDER — FLUTICASONE PROPIONATE 50 MCG/ACT NA SUSP: 1.0000 | Freq: Every day | NASAL | 0 refills | Status: DC

## 2019-05-06 NOTE — ED Triage Notes (Signed)
Patient reports sore throat and ear pain (left) since Sunday. Denies fever, cough, or sob. States she feels nasal drainage in back of throat. No interaction with COVID +.

## 2019-05-06 NOTE — Discharge Instructions (Addendum)
Use the Flonase and Zyrtec as prescribed.    Your rapid strep test was negative today.  We will send it for a culture and call you if it comes back positive.    Return here or follow-up with your primary care provider if you develop fever, chills, cough, shortness of breath, vomiting, diarrhea, or other symptoms.

## 2019-05-06 NOTE — ED Provider Notes (Signed)
[facility]    CSN: [id number] Arrival date & time: 05/06/19  1644     History   Chief Complaint Chief Complaint  Patient presents with  . Sore Throat  . Otalgia    HPI Mary Chambers is a 48 y.o. female.   Patient presents with post-nasal drip, sore throat, and left ear pain x3 days.  She rates the pain at 5/10.  She states the pain is worse with swallowing but is able to swallow without difficulty.  She denies fever, chills, cough, shortness of breath, abdominal pain, nausea, vomiting, diarrhea, rash.  LMP: hysterectomy.    Patient denies COVID exposure.   The history is provided by the patient.    Past Medical History:  Diagnosis Date  . Hypertension   . Kidney stone     Patient Active Problem List   Diagnosis Date Noted  . Pain in left foot 03/29/2018  . Arthritis 02/13/2018  . Chronic back pain 02/13/2018  . Migraines 02/13/2018  . Morbid obesity with BMI of 40.0-44.9, adult ([place]) 02/13/2018  . Synovial cyst of lumbar spine 10/15/2017  . Osteoarthritis of knee 07/13/2017  . Lumbar disc disease with radiculopathy 08/01/2016  . Fatigue 05/04/2016  . Headache 05/04/2016  . Numbness of foot 05/04/2016  . 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 Arthrodesis status 02/02/2016  . Chronic pain syndrome 02/02/2016  . Elevated white blood cell count 12/01/2015  . Lumbar stenosis with neurogenic claudication 05/07/2015  . Bilateral leg edema 04/21/2015  . Carpal tunnel syndrome of right wrist 12/14/2014  . Renal atrophy, left 01/23/2014  . Achilles tendonitis 01/16/2014  . Right lower quadrant pain 12/18/2013  . Hypertension, benign 06/03/2010  . Urinary incontinence 06/03/2010  . Cervical disc disease 09/09/2009  . 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 ABDOMINAL HYSTERECTOMY    . BACK SURGERY    . CARPAL TUNNEL RELEASE    . CHOLECYSTECTOMY    . LITHOTRIPSY    . neck fusion    . TUBAL LIGATION       OB History   No obstetric history on file.      Home Medications    Prior to Admission medications   Medication Sig Start Date End Date Taking? Authorizing Provider  enalapril (VASOTEC) 10 MG tablet Take 10 mg by mouth daily.   Yes [provider]  hydrochlorothiazide (HYDRODIURIL) 25 MG tablet Take 25 mg by mouth daily.   Yes [provider]  cetirizine (ZYRTEC) 10 MG chewable tablet Chew 1 tablet (10 mg total) by mouth daily. 05/06/19 06/05/19  Sharion Balloon, NP  diphenhydrAMINE (BENADRYL) 25 mg capsule Take by mouth. 03/10/16   [provider]  fluticasone (FLONASE) 50 MCG/ACT nasal spray Place 1 spray into both nostrils daily. 05/06/19   Sharion Balloon, NP  LYRICA 50 MG capsule Take 50 mg by mouth 3 (three) times daily. 04/15/18   [provider]  meloxicam (MOBIC) 15 MG tablet Take 15 mg by mouth daily. 04/12/18   [provider]  methylPREDNISolone (MEDROL DOSEPAK) 4 MG TBPK tablet 6 day dose pack - take as directed 04/22/18   Hyatt, Max T, DPM  Multiple Vitamin (MULTIVITAMIN) capsule Take by mouth.    [provider]  naproxen (NAPROSYN) 500 MG tablet naproxen 500 mg tablet  Take 1 tablet twice a day by oral route.    [provider]  potassium chloride SA (K-DUR,KLOR-CON) 20 MEQ tablet Take 20  mEq by mouth 2 (two) times daily.    [provider]  tolterodine (DETROL LA) 4 MG 24 hr capsule Take 1 capsule (4 mg total) by mouth daily. 04/16/19   Vanessa Kick, MD    Family History Family History  Problem Relation Age of Onset  . Diabetes Mother   . Hypertension Mother   . Cancer Father   . Parkinson's disease Father     Social History Social History   Tobacco Use  . Smoking status: Never Smoker  . Smokeless tobacco: Never Used  Substance Use Topics  . Alcohol use: No  . Drug use: Never     Allergies   Baclofen, Doxycycline, Percocet [oxycodone-acetaminophen], Septra [sulfamethoxazole-trimethoprim], and  Gabapentin   Review of Systems Review of Systems  Constitutional: Negative for chills.  HENT: Positive for postnasal drip. Negative for trouble swallowing.   Eyes: Negative for pain and visual disturbance.  Respiratory: Negative for shortness of breath.   Cardiovascular: Negative for chest pain and palpitations.  Genitourinary: Negative for dysuria and hematuria.  Musculoskeletal: Negative for arthralgias and back pain.  Skin: Negative for color change.  Neurological: Negative for seizures and syncope.  All other systems reviewed and are negative.    Physical Exam Triage Vital Signs ED Triage Vitals  Enc Vitals Group     BP 05/06/19 1700 130/85     Pulse Rate 05/06/19 1700 (!) 122     Resp 05/06/19 1700 18     Temp 05/06/19 1700 98 F (36.7 C)     Temp Source 05/06/19 1700 Oral     SpO2 05/06/19 1700 95 %     Weight --      Height --      Head Circumference --      Peak Flow --      Pain Score 05/06/19 1702 5     Pain Loc --      Pain Edu? --      Excl. in [place]? --    No data found.  Updated Vital Signs BP 130/85 (BP Location: Left Arm)   Pulse (!) 122   Temp 98 F (36.7 C) (Oral)   Resp 18   SpO2 95%   Visual Acuity Right Eye Distance:   Left Eye Distance:   Bilateral Distance:    Right Eye Near:   Left Eye Near:    Bilateral Near:     Physical Exam Vitals signs and nursing note reviewed.  Constitutional:      General: She is not in acute distress.    Appearance: She is well-developed.  HENT:     Head: Normocephalic and atraumatic.     Right Ear: Tympanic membrane and ear canal normal.     Left Ear: Tympanic membrane and ear canal normal.     Nose: Nose normal.     Mouth/Throat:     Mouth: Mucous membranes are moist.     Pharynx: Oropharynx is clear. Posterior oropharyngeal erythema present. No oropharyngeal exudate.  Eyes:     Conjunctiva/sclera: Conjunctivae normal.  Neck:     Musculoskeletal: Neck supple.  Cardiovascular:     Rate and  Rhythm: Normal rate and regular rhythm.     Heart sounds: No murmur.  Pulmonary:     Effort: Pulmonary effort is normal. No respiratory distress.     Breath sounds: Normal breath sounds.  Abdominal:     Palpations: Abdomen is soft.     Tenderness: There is no abdominal tenderness. There is no  guarding or rebound.  Skin:    General: Skin is warm and dry.     Findings: No rash.  Neurological:     Mental Status: She is alert.      UC Treatments / Results  Labs (all labs ordered are listed, but only abnormal results are displayed) Labs Reviewed  CULTURE, GROUP A STREP [facility])  POCT RAPID STREP A    EKG   Radiology No results found.  Procedures Procedures (including critical care time)  Medications Ordered in UC Medications - No data to display  Initial Impression / Assessment and Plan / UC Course  I have reviewed the triage vital signs and the nursing notes.  Pertinent labs & imaging results that were available during my care of the patient were reviewed by me and considered in my medical decision making (see chart for details).  Clinical Course as of May 05 1842  Tue May 06, 2019  1743 Culture, group A strep [KT]  1743 POCT Rapid Strep A [KT]    Clinical Course User Index [KT] Sharion Balloon, NP    Sore throat.  Left ear pain.  Rapid strep negative; culture pending.  Treating with Zyrtec and Flonase.  Discussed with patient that she should return here or follow-up with her PCP if she develops fever, chills, cough, S OB, vomiting, diarrhea, or other symptoms.     Final Clinical Impressions(s) / UC Diagnoses   Final diagnoses:  Sore throat  Left ear pain     Discharge Instructions     Use the Flonase and Zyrtec as prescribed.    Your rapid strep test was negative today.  We will send it for a culture and call you if it comes back positive.    Return here or follow-up with your primary care provider if you develop fever, chills, cough, shortness of breath,  vomiting, diarrhea, or other symptoms.        ED Prescriptions    Medication Sig Dispense Auth. Provider   fluticasone (FLONASE) 50 MCG/ACT nasal spray Place 1 spray into both nostrils daily. 16 g Sharion Balloon, NP   cetirizine (ZYRTEC) 10 MG chewable tablet Chew 1 tablet (10 mg total) by mouth daily. 30 tablet Sharion Balloon, NP     Controlled Substance Prescriptions [place] Controlled Substance Registry consulted? Not Applicable   Sharion Balloon, NP 05/06/19 1843

## 2019-05-06 NOTE — ED Notes (Signed)
Patient strep negative , per [facility] CMA

## 2019-05-09 LAB — CULTURE, GROUP A STREP (THRC)

## 2019-05-20 ENCOUNTER — Other Ambulatory Visit: Payer: Self-pay

## 2019-05-20 DIAGNOSIS — Z20822 Contact with and (suspected) exposure to covid-19: Secondary | ICD-10-CM

## 2019-05-21 LAB — NOVEL CORONAVIRUS, NAA: SARS-CoV-2, NAA: NOT DETECTED

## 2019-05-26 ENCOUNTER — Encounter (HOSPITAL_COMMUNITY): Payer: Self-pay | Admitting: Emergency Medicine

## 2019-05-26 ENCOUNTER — Ambulatory Visit (HOSPITAL_COMMUNITY)
Admission: EM | Admit: 2019-05-26 | Discharge: 2019-05-26 | Disposition: A | Discharge status: Home / Self Care | Payer: Medicaid Other | Attending: Emergency Medicine | Admitting: Emergency Medicine

## 2019-05-26 ENCOUNTER — Emergency Department (HOSPITAL_COMMUNITY)
Admission: EM | Admit: 2019-05-26 | Discharge: 2019-05-26 | Disposition: A | Discharge status: Home / Self Care | Payer: Medicaid Other | Attending: Emergency Medicine | Admitting: Emergency Medicine

## 2019-05-26 ENCOUNTER — Other Ambulatory Visit: Payer: Self-pay

## 2019-05-26 DIAGNOSIS — N390 Urinary tract infection, site not specified: Secondary | ICD-10-CM | POA: Diagnosis not present

## 2019-05-26 DIAGNOSIS — R42 Dizziness and giddiness: Secondary | ICD-10-CM | POA: Diagnosis not present

## 2019-05-26 DIAGNOSIS — Z5321 Procedure and treatment not carried out due to patient leaving prior to being seen by health care provider: Secondary | ICD-10-CM | POA: Diagnosis not present

## 2019-05-26 LAB — BASIC METABOLIC PANEL
Anion gap: 11 (ref 5–15)
BUN: 12 mg/dL (ref 6–20)
CO2: 23 mmol/L (ref 22–32)
Calcium: 8.7 mg/dL — ABNORMAL LOW (ref 8.9–10.3)
Chloride: 107 mmol/L (ref 98–111)
Creatinine, Ser: 0.76 mg/dL (ref 0.44–1.00)
GFR calc Af Amer: >60 mL/min (ref >60)
GFR calc non Af Amer: >60 mL/min (ref >60)
Glucose, Bld: 84 mg/dL (ref 70–99)
Potassium: 3.7 mmol/L (ref 3.5–5.1)
Sodium: 141 mmol/L (ref 135–145)

## 2019-05-26 LAB — URINALYSIS, ROUTINE W REFLEX MICROSCOPIC
Bilirubin Urine: NEGATIVE
Glucose, UA: NEGATIVE mg/dL
Hgb urine dipstick: NEGATIVE
Ketones, ur: NEGATIVE mg/dL
Nitrite: POSITIVE — AB
Protein, ur: NEGATIVE mg/dL
Specific Gravity, Urine: 1.023 (ref 1.005–1.030)
pH: 6 (ref 5.0–8.0)

## 2019-05-26 LAB — CBC
HCT: 43.1 % (ref 36.0–46.0)
Hemoglobin: 13.4 g/dL (ref 12.0–15.0)
MCH: 28.6 pg (ref 26.0–34.0)
MCHC: 31.1 g/dL (ref 30.0–36.0)
MCV: 92.1 fL (ref 80.0–100.0)
Platelets: 299 10*3/uL (ref 150–400)
RBC: 4.68 MIL/uL (ref 3.87–5.11)
RDW: 12.2 % (ref 11.5–15.5)
WBC: 6.8 10*3/uL (ref 4.0–10.5)
nRBC: 0 % (ref 0.0–0.2)

## 2019-05-26 LAB — I-STAT BETA HCG BLOOD, ED (MC, WL, AP ONLY): I-stat hCG, quantitative: <5 m[IU]/mL (ref <5)

## 2019-05-26 MED ORDER — NITROFURANTOIN MONOHYD MACRO 100 MG PO CAPS: 100.0000 mg | ORAL_CAPSULE | Freq: Two times a day (BID) | ORAL | 0 refills | Status: DC

## 2019-05-26 MED ORDER — SODIUM CHLORIDE 0.9% FLUSH: 3.0000 mL | Freq: Once | INTRAVENOUS | Status: DC

## 2019-05-26 MED ORDER — MECLIZINE HCL 12.5 MG PO TABS: 12.5000 mg | ORAL_TABLET | Freq: Three times a day (TID) | ORAL | 0 refills | Status: DC | PRN

## 2019-05-26 NOTE — ED Triage Notes (Signed)
Pt with dizziness and headaches today, recently treated for allergies with Flonase. Denies chest pain or SOB.

## 2019-05-26 NOTE — ED Provider Notes (Signed)
[facility]    CSN: [id number] Arrival date & time: 05/26/19  1801      History   Chief Complaint Chief Complaint  Patient presents with  . Headache  . Dizziness    HPI Mary Chambers is a 48 y.o. female.   Patient presents with lightheadedness since this morning and a headache x3 days.  She was seen in the emergency department triage but left due to the wait time.  She denies otalgia, congestion, palpitations, chest pain, shortness of breath, abdominal pain, dysuria, vomiting, diarrhea, or other symptoms.  She had a negative COVID test on 05/20/2019.  Patient was seen here and treated for allergy symptoms on 05/06/2019; treated with Zyrtec and Flonase.    The history is provided by the patient.    Past Medical History:  Diagnosis Date  . Hypertension   . Kidney stone     Patient Active Problem List   Diagnosis Date Noted  . Pain in left foot 03/29/2018  . Arthritis 02/13/2018  . Chronic back pain 02/13/2018  . Migraines 02/13/2018  . Morbid obesity with BMI of 40.0-44.9, adult ([place]) 02/13/2018  . Synovial cyst of lumbar spine 10/15/2017  . Osteoarthritis of knee 07/13/2017  . Lumbar disc disease with radiculopathy 08/01/2016  . Fatigue 05/04/2016  . Headache 05/04/2016  . Numbness of foot 05/04/2016  . 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 Arthrodesis status 02/02/2016  . Chronic pain syndrome 02/02/2016  . Elevated white blood cell count 12/01/2015  . Lumbar stenosis with neurogenic claudication 05/07/2015  . Bilateral leg edema 04/21/2015  . Carpal tunnel syndrome of right wrist 12/14/2014  . Renal atrophy, left 01/23/2014  . Achilles tendonitis 01/16/2014  . Right lower quadrant pain 12/18/2013  . Hypertension, benign 06/03/2010  . Urinary incontinence 06/03/2010  . Cervical disc disease 09/09/2009  . 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 ABDOMINAL HYSTERECTOMY    . BACK SURGERY    . CARPAL  TUNNEL RELEASE    . CHOLECYSTECTOMY    . LITHOTRIPSY    . neck fusion    . TUBAL LIGATION      OB History   No obstetric history on file.      Home Medications    Prior to Admission medications   Medication Sig Start Date End Date Taking? Authorizing Provider  enalapril (VASOTEC) 10 MG tablet Take 10 mg by mouth daily.   Yes [provider]  fluticasone (FLONASE) 50 MCG/ACT nasal spray Place 1 spray into both nostrils daily. 05/06/19  Yes Sharion Balloon, NP  hydrochlorothiazide (HYDRODIURIL) 25 MG tablet Take 25 mg by mouth daily.   Yes [provider]  Multiple Vitamin (MULTIVITAMIN) capsule Take by mouth.   Yes [provider]  potassium chloride SA (K-DUR,KLOR-CON) 20 MEQ tablet Take 20 mEq by mouth 2 (two) times daily.   Yes [provider]  cetirizine (ZYRTEC) 10 MG chewable tablet Chew 1 tablet (10 mg total) by mouth daily. 05/06/19 06/05/19  Sharion Balloon, NP  diphenhydrAMINE (BENADRYL) 25 mg capsule Take by mouth. 03/10/16   [provider]  LYRICA 50 MG capsule Take 50 mg by mouth 3 (three) times daily. 04/15/18   [provider]  meclizine (ANTIVERT) 12.5 MG tablet Take 1 tablet (12.5 mg total) by mouth 3 (three) times daily as needed for dizziness. 05/26/19   Sharion Balloon, NP  meloxicam (MOBIC) 15 MG tablet Take 15 mg by mouth daily.  04/12/18   [provider]  methylPREDNISolone (MEDROL DOSEPAK) 4 MG TBPK tablet 6 day dose pack - take as directed 04/22/18   Hyatt, Max T, DPM  naproxen (NAPROSYN) 500 MG tablet naproxen 500 mg tablet  Take 1 tablet twice a day by oral route.    [provider]  nitrofurantoin, macrocrystal-monohydrate, (MACROBID) 100 MG capsule Take 1 capsule (100 mg total) by mouth 2 (two) times daily. 05/26/19   Sharion Balloon, NP  tolterodine (DETROL LA) 4 MG 24 hr capsule Take 1 capsule (4 mg total) by mouth daily. 04/16/19   Vanessa Kick, MD    Family History Family History  Problem Relation  Age of Onset  . Diabetes Mother   . Hypertension Mother   . Cancer Father   . Parkinson's disease Father     Social History Social History   Tobacco Use  . Smoking status: Never Smoker  . Smokeless tobacco: Never Used  Substance Use Topics  . Alcohol use: No  . Drug use: Never     Allergies   Baclofen, Doxycycline, Percocet [oxycodone-acetaminophen], Septra [sulfamethoxazole-trimethoprim], and Gabapentin   Review of Systems Review of Systems  Constitutional: Negative for chills and fever.  HENT: Negative for ear pain and sore throat.   Eyes: Negative for pain and visual disturbance.  Respiratory: Negative for cough and shortness of breath.   Cardiovascular: Negative for chest pain and palpitations.  Gastrointestinal: Negative for abdominal pain and vomiting.  Genitourinary: Negative for dysuria and hematuria.  Musculoskeletal: Negative for arthralgias and back pain.  Skin: Negative for color change and rash.  Neurological: Positive for light-headedness and headaches. Negative for seizures, syncope, facial asymmetry, speech difficulty, weakness and numbness.  All other systems reviewed and are negative.    Physical Exam Triage Vital Signs ED Triage Vitals  Enc Vitals Group     BP 05/26/19 1821 (!) 127/99     Pulse Rate 05/26/19 1821 93     Resp 05/26/19 1821 16     Temp 05/26/19 1821 98.5 F (36.9 C)     Temp Source 05/26/19 1821 Oral     SpO2 05/26/19 1821 100 %     Weight --      Height --      Head Circumference --      Peak Flow --      Pain Score 05/26/19 1822 7     Pain Loc --      Pain Edu? --      Excl. in [place]? --    No data found.  Updated Vital Signs BP (!) 127/99   Pulse 93   Temp 98.5 F (36.9 C) (Oral)   Resp 16   SpO2 100%   Visual Acuity Right Eye Distance:   Left Eye Distance:   Bilateral Distance:    Right Eye Near:   Left Eye Near:    Bilateral Near:     Physical Exam Vitals signs and nursing note reviewed.   Constitutional:      General: She is not in acute distress.    Appearance: She is well-developed.  HENT:     Head: Normocephalic and atraumatic.     Right Ear: Tympanic membrane normal.     Left Ear: Tympanic membrane normal.     Nose: Nose normal.     Mouth/Throat:     Mouth: Mucous membranes are moist.     Pharynx: Oropharynx is clear.  Eyes:     Extraocular Movements: Extraocular movements intact.  Conjunctiva/sclera: Conjunctivae normal.     Pupils: Pupils are equal, round, and reactive to light.  Neck:     Musculoskeletal: Neck supple.  Cardiovascular:     Rate and Rhythm: Normal rate and regular rhythm.     Heart sounds: No murmur.  Pulmonary:     Effort: Pulmonary effort is normal. No respiratory distress.     Breath sounds: Normal breath sounds.  Abdominal:     General: Bowel sounds are normal.     Palpations: Abdomen is soft.     Tenderness: There is no abdominal tenderness. There is no right CVA tenderness, left CVA tenderness, guarding or rebound.  Skin:    General: Skin is warm and dry.     Capillary Refill: Capillary refill takes less than 2 seconds.  Neurological:     General: No focal deficit present.     Mental Status: She is alert and oriented to person, place, and time.     Cranial Nerves: No cranial nerve deficit.     Sensory: No sensory deficit.     Motor: No weakness.     Coordination: Coordination normal.     Gait: Gait normal.     Deep Tendon Reflexes: Reflexes normal.      UC Treatments / Results  Labs (all labs ordered are listed, but only abnormal results are displayed) Labs Reviewed - No data to display  EKG   Radiology No results found.  Procedures Procedures (including critical care time)  Medications Ordered in UC Medications - No data to display  Initial Impression / Assessment and Plan / UC Course  I have reviewed the triage vital signs and the nursing notes.  Pertinent labs & imaging results that were available during  my care of the patient were reviewed by me and considered in my medical decision making (see chart for details).    Dizziness.  UTI.  EKG and labs done in emergency room triage unremarkable other than urine positive for LE and nitrite.  Treating dizziness with meclizine as needed; precautions for drowsiness given to patient included not to drive, operate machinery, or drink alcohol with this medication.  Treating UTI with Macrobid.  Instructed patient to stay hydrated.  Discussed that her blood pressure is slightly elevated today and that she needs to follow-up with her PCP in 2 to 4 weeks for recheck.  Instructed patient to go to the emergency department if she develops increased dizziness, palpitations, chest pain, shortness of breath, or other concerning symptoms.     Final Clinical Impressions(s) / UC Diagnoses   Final diagnoses:  Dizziness  Urinary tract infection without hematuria, site unspecified     Discharge Instructions     Take the anti-dizziness medication meclizine as needed up to 3 times a day; this medication may make you drowsy so do not drive, operate machinery, or drink alcohol while taking it.    Your urine shows evidence of a urinary tract infection.  Take the prescribed antibiotic twice a day x5 days.    Stay hydrated by drinking 8 to 10 glasses of water per day.    Your blood pressure was elevated today at 127/99.  Follow-up with your primary care provider for a recheck in 2 to 4 weeks.    Go to the emergency department if you have increased dizziness, heart palpitations, chest pain, shortness of breath, or other concerning symptoms.        ED Prescriptions    Medication Sig Dispense Auth. Provider   nitrofurantoin,  macrocrystal-monohydrate, (MACROBID) 100 MG capsule Take 1 capsule (100 mg total) by mouth 2 (two) times daily. 10 capsule Sharion Balloon, NP   meclizine (ANTIVERT) 12.5 MG tablet Take 1 tablet (12.5 mg total) by mouth 3 (three) times daily as needed  for dizziness. 30 tablet Sharion Balloon, NP     Controlled Substance Prescriptions [place] Controlled Substance Registry consulted? Not Applicable   Sharion Balloon, NP 05/26/19 Einar Crow

## 2019-05-26 NOTE — ED Triage Notes (Signed)
PT reports nagging headache that hurts along right posterior head.

## 2019-05-26 NOTE — ED Triage Notes (Signed)
PT reports dizziness that started once she got out of bed. Describes dizziness as lightheadedness/ room spinning. PT went to ED and had triage workup, but left due to wait time.

## 2019-05-26 NOTE — Discharge Instructions (Signed)
Take the anti-dizziness medication meclizine as needed up to 3 times a day; this medication may make you drowsy so do not drive, operate machinery, or drink alcohol while taking it.    Your urine shows evidence of a urinary tract infection.  Take the prescribed antibiotic twice a day x5 days.    Stay hydrated by drinking 8 to 10 glasses of water per day.    Your blood pressure was elevated today at 127/99.  Follow-up with your primary care provider for a recheck in 2 to 4 weeks.    Go to the emergency department if you have increased dizziness, heart palpitations, chest pain, shortness of breath, or other concerning symptoms.

## 2019-06-07 ENCOUNTER — Other Ambulatory Visit: Payer: Self-pay

## 2019-06-07 ENCOUNTER — Encounter (HOSPITAL_COMMUNITY): Payer: Self-pay | Admitting: Emergency Medicine

## 2019-06-07 ENCOUNTER — Ambulatory Visit (HOSPITAL_COMMUNITY)
Admission: EM | Admit: 2019-06-07 | Discharge: 2019-06-07 | Disposition: A | Discharge status: Home / Self Care | Payer: Medicaid Other | Attending: Emergency Medicine | Admitting: Emergency Medicine

## 2019-06-07 DIAGNOSIS — M25531 Pain in right wrist: Secondary | ICD-10-CM

## 2019-06-07 DIAGNOSIS — M654 Radial styloid tenosynovitis [de Quervain]: Secondary | ICD-10-CM

## 2019-06-07 MED ORDER — PREDNISONE 50 MG PO TABS: 50.0000 mg | ORAL_TABLET | Freq: Every day | ORAL | 0 refills | Status: AC

## 2019-06-07 NOTE — ED Provider Notes (Signed)
[place]    CSN: [id number] Arrival date & time: 06/07/19  1242      History   Chief Complaint Chief Complaint  Patient presents with  . Appointment  . Wrist Pain    HPI Mary Chambers is a 48 y.o. female history of hypertension, arthritis, previous bilateral carpal tunnel surgical release, presenting today for evaluation of right wrist and hand pain.  Patient states that over the past couple days she has had pain in her first 2 fingers that radiates into her wrist and up towards her elbow.  She denies any injury.  Notices symptoms worsening with gripping.  Patient drives as a living and feels today while she was gripping the steering wheel her pain was significantly worse.  She has been using Tylenol without relief.  Denies history of similar.  Denies numbness or tingling.  Since her carpal tunnel surgery she has not had issues and denies similar discomfort prior to surgery.  Denies symptoms on left.  Patient is left-handed.  HPI  Past Medical History:  Diagnosis Date  . Hypertension   . Kidney stone     Patient Active Problem List   Diagnosis Date Noted  . Pain in left foot 03/29/2018  . Arthritis 02/13/2018  . Chronic back pain 02/13/2018  . Migraines 02/13/2018  . Morbid obesity with BMI of 40.0-44.9, adult ([place]) 02/13/2018  . Synovial cyst of lumbar spine 10/15/2017  . Osteoarthritis of knee 07/13/2017  . Lumbar disc disease with radiculopathy 08/01/2016  . Fatigue 05/04/2016  . Headache 05/04/2016  . Numbness of foot 05/04/2016  . 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 Arthrodesis status 02/02/2016  . Chronic pain syndrome 02/02/2016  . Elevated white blood cell count 12/01/2015  . Lumbar stenosis with neurogenic claudication 05/07/2015  . Bilateral leg edema 04/21/2015  . Carpal tunnel syndrome of right wrist 12/14/2014  . Renal atrophy, left 01/23/2014  . Achilles tendonitis 01/16/2014  . Right lower quadrant pain 12/18/2013  . Hypertension,  benign 06/03/2010  . Urinary incontinence 06/03/2010  . Cervical disc disease 09/09/2009  . 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 ABDOMINAL HYSTERECTOMY    . BACK SURGERY    . CARPAL TUNNEL RELEASE    . CHOLECYSTECTOMY    . LITHOTRIPSY    . neck fusion    . TUBAL LIGATION      OB History   No obstetric history on file.      Home Medications    Prior to Admission medications   Medication Sig Start Date End Date Taking? Authorizing Provider  enalapril (VASOTEC) 10 MG tablet Take 10 mg by mouth daily.   Yes [provider]  fluticasone (FLONASE) 50 MCG/ACT nasal spray Place 1 spray into both nostrils daily. 05/06/19  Yes Sharion Balloon, NP  hydrochlorothiazide (HYDRODIURIL) 25 MG tablet Take 25 mg by mouth daily.   Yes [provider]  meclizine (ANTIVERT) 12.5 MG tablet Take 1 tablet (12.5 mg total) by mouth 3 (three) times daily as needed for dizziness. 05/26/19  Yes Sharion Balloon, NP  Multiple Vitamin (MULTIVITAMIN) capsule Take by mouth.   Yes [provider]  nitrofurantoin, macrocrystal-monohydrate, (MACROBID) 100 MG capsule Take 1 capsule (100 mg total) by mouth 2 (two) times daily. 05/26/19  Yes Sharion Balloon, NP  potassium chloride SA (K-DUR,KLOR-CON) 20 MEQ tablet Take 20 mEq by mouth 2 (two) times daily.   Yes [provider]  cetirizine (ZYRTEC)  10 MG chewable tablet Chew 1 tablet (10 mg total) by mouth daily. 05/06/19 06/05/19  Sharion Balloon, NP  diphenhydrAMINE (BENADRYL) 25 mg capsule Take by mouth. 03/10/16   [provider]  meloxicam (MOBIC) 15 MG tablet Take 15 mg by mouth daily. 04/12/18   [provider]  naproxen (NAPROSYN) 500 MG tablet naproxen 500 mg tablet  Take 1 tablet twice a day by oral route.    [provider]  predniSONE (DELTASONE) 50 MG tablet Take 1 tablet (50 mg total) by mouth daily for 5 days. 06/07/19 06/12/19  Wieters, Hallie C, PA-C  LYRICA 50  MG capsule Take 50 mg by mouth 3 (three) times daily. 04/15/18 06/07/19  [provider]    Family History Family History  Problem Relation Age of Onset  . Diabetes Mother   . Hypertension Mother   . Cancer Father   . Parkinson's disease Father     Social History Social History   Tobacco Use  . Smoking status: Never Smoker  . Smokeless tobacco: Never Used  Substance Use Topics  . Alcohol use: No  . Drug use: Never     Allergies   Baclofen, Doxycycline, Percocet [oxycodone-acetaminophen], Septra [sulfamethoxazole-trimethoprim], and Gabapentin   Review of Systems Review of Systems  Constitutional: Negative for fatigue and fever.  Eyes: Negative for visual disturbance.  Respiratory: Negative for shortness of breath.   Cardiovascular: Negative for chest pain.  Gastrointestinal: Negative for abdominal pain, nausea and vomiting.  Musculoskeletal: Positive for arthralgias and joint swelling.  Skin: Negative for color change, rash and wound.  Neurological: Negative for dizziness, weakness, light-headedness and headaches.     Physical Exam Triage Vital Signs ED Triage Vitals [06/07/19 1307]  Enc Vitals Group     BP 138/79     Pulse Rate 69     Resp 16     Temp 97.9 F (36.6 C)     Temp Source Oral     SpO2 96 %     Weight      Height      Head Circumference      Peak Flow      Pain Score 6     Pain Loc      Pain Edu?      Excl. in [place]?    No data found.  Updated Vital Signs BP 138/79   Pulse 69   Temp 97.9 F (36.6 C) (Oral)   Resp 16   SpO2 96%   Visual Acuity Right Eye Distance:   Left Eye Distance:   Bilateral Distance:    Right Eye Near:   Left Eye Near:    Bilateral Near:     Physical Exam Vitals signs and nursing note reviewed.  Constitutional:      Appearance: She is well-developed.     Comments: No acute distress  HENT:     Head: Normocephalic and atraumatic.     Nose: Nose normal.  Eyes:     Conjunctiva/sclera: Conjunctivae  normal.  Neck:     Musculoskeletal: Neck supple.  Cardiovascular:     Rate and Rhythm: Normal rate.  Pulmonary:     Effort: Pulmonary effort is normal. No respiratory distress.  Abdominal:     General: There is no distension.  Musculoskeletal: Normal range of motion.     Comments: Right wrist/hand: Mild swelling compared to left, no overlying erythema or discoloration, tender to palpation of distal radius extending into first and second metacarpals, nontender at interphalangeal  joints of first and second fingers, full active range of motion, positive Finkelstein's on right Full active range of motion proximally at the elbow Radial pulse 2+  Skin:    General: Skin is warm and dry.  Neurological:     Mental Status: She is alert and oriented to person, place, and time.      UC Treatments / Results  Labs (all labs ordered are listed, but only abnormal results are displayed) Labs Reviewed - No data to display  EKG   Radiology No results found.  Procedures Procedures (including critical care time)  Medications Ordered in UC Medications - No data to display  Initial Impression / Assessment and Plan / UC Course  I have reviewed the triage vital signs and the nursing notes.  Pertinent labs & imaging results that were available during my care of the patient were reviewed by me and considered in my medical decision making (see chart for details).     Patient likely with inflammatory cause of discomfort, possible de Quervain's given reproduction of symptoms with Finkelstein's.  Will place in thumb spica, 5-day course of prednisone, ice, rest.Discussed strict return precautions. Patient verbalized understanding and is agreeable with plan.  Final Clinical Impressions(s) / UC Diagnoses   Final diagnoses:  Right wrist pain  Tenosynovitis, de Quervain     Discharge Instructions     Wear wrist brace to immobilize wrist and thumb Prednisone daily for 5 days with food May ice  wrist  Please follow-up if symptoms not resolving with rest and the above over the next week   ED Prescriptions    Medication Sig Dispense Auth. Provider   predniSONE (DELTASONE) 50 MG tablet Take 1 tablet (50 mg total) by mouth daily for 5 days. 5 tablet Wieters, Hallie C, PA-C     Controlled Substance Prescriptions [place] Controlled Substance Registry consulted? Not Applicable   Janith Lima, [place] 06/07/19 1327

## 2019-06-07 NOTE — ED Triage Notes (Signed)
PT reports right wrist and hand pain since Thursday. PT has history of carpal tunnel surgery bilaterally

## 2019-06-07 NOTE — Discharge Instructions (Signed)
Wear wrist brace to immobilize wrist and thumb Prednisone daily for 5 days with food May ice wrist  Please follow-up if symptoms not resolving with rest and the above over the next week

## 2019-06-21 ENCOUNTER — Other Ambulatory Visit: Payer: Self-pay

## 2019-06-21 ENCOUNTER — Emergency Department (HOSPITAL_BASED_OUTPATIENT_CLINIC_OR_DEPARTMENT_OTHER)
Admission: EM | Admit: 2019-06-21 | Discharge: 2019-06-21 | Disposition: A | Discharge status: Home / Self Care | Payer: Medicaid Other | Attending: Emergency Medicine | Admitting: Emergency Medicine

## 2019-06-21 ENCOUNTER — Encounter (HOSPITAL_BASED_OUTPATIENT_CLINIC_OR_DEPARTMENT_OTHER): Payer: Self-pay | Admitting: Emergency Medicine

## 2019-06-21 DIAGNOSIS — Z20828 Contact with and (suspected) exposure to other viral communicable diseases: Secondary | ICD-10-CM | POA: Diagnosis not present

## 2019-06-21 DIAGNOSIS — R05 Cough: Secondary | ICD-10-CM | POA: Insufficient documentation

## 2019-06-21 DIAGNOSIS — Z20822 Contact with and (suspected) exposure to covid-19: Secondary | ICD-10-CM

## 2019-06-21 DIAGNOSIS — Z79899 Other long term (current) drug therapy: Secondary | ICD-10-CM | POA: Diagnosis not present

## 2019-06-21 DIAGNOSIS — I10 Essential (primary) hypertension: Secondary | ICD-10-CM | POA: Insufficient documentation

## 2019-06-21 MED ORDER — ALBUTEROL SULFATE HFA 108 (90 BASE) MCG/ACT IN AERS
2.0000 | INHALATION_SPRAY | RESPIRATORY_TRACT | Status: DC | PRN
  Administered 2019-06-21: 2 via RESPIRATORY_TRACT
  Filled 2019-06-21: qty 6.7

## 2019-06-21 MED ORDER — OXYMETAZOLINE HCL 0.05 % NA SOLN
2.0000 | Freq: Two times a day (BID) | NASAL | Status: DC | PRN
  Administered 2019-06-21: 06:00:00 2 via NASAL
  Filled 2019-06-21: qty 30

## 2019-06-21 MED ORDER — HYDROCOD POLST-CPM POLST ER 10-8 MG/5ML PO SUER
5.0000 mL | Freq: Once | ORAL | Status: AC
  Administered 2019-06-21: 5 mL via ORAL
  Filled 2019-06-21: qty 5

## 2019-06-21 MED ORDER — HYDROCOD POLST-CPM POLST ER 10-8 MG/5ML PO SUER: 5.0000 mL | Freq: Two times a day (BID) | ORAL | 0 refills | Status: DC | PRN

## 2019-06-21 NOTE — ED Provider Notes (Signed)
[facility] Provider Note: Mary Spurling, MD, FACEP  CSN: [id number] MRN: [id number] ARRIVAL: 06/21/19 at [place]: [place]  Cough   HISTORY OF PRESENT ILLNESS  06/21/19 5:10 AM Mary Chambers is a 48 y.o. female with a 2-day history of nasal congestion and cough.  She has been taking over-the-counter Mucinex without relief.  She states her symptoms are in fact worsening.  She denies fever, chills, change in taste or smell, sore throat, nausea, vomiting or diarrhea.  She is having some pain in her left hand which is unrelated to her current illness.   Past Medical History:  Diagnosis Date  . Hypertension   . Kidney stone     Past Surgical History:  Procedure Laterality Date  . ABDOMINAL HYSTERECTOMY    . BACK SURGERY    . CARPAL TUNNEL RELEASE    . CHOLECYSTECTOMY    . LITHOTRIPSY    . neck fusion    . TUBAL LIGATION      Family History  Problem Relation Age of Onset  . Diabetes Mother   . Hypertension Mother   . Cancer Father   . Parkinson's disease Father     Social History   Tobacco Use  . Smoking status: Never Smoker  . Smokeless tobacco: Never Used  Substance Use Topics  . Alcohol use: No  . Drug use: Never    Prior to Admission medications   Medication Sig Start Date End Date Taking? Authorizing Provider  cetirizine (ZYRTEC) 10 MG chewable tablet Chew 1 tablet (10 mg total) by mouth daily. 05/06/19 06/05/19  Sharion Balloon, NP  chlorpheniramine-HYDROcodone (TUSSIONEX PENNKINETIC ER) 10-8 MG/5ML SUER Take 5 mLs by mouth every 12 (twelve) hours as needed for cough. 06/21/19   Ahtziri Jeffries, MD  enalapril (VASOTEC) 10 MG tablet Take 10 mg by mouth daily.    [provider]  fluticasone (FLONASE) 50 MCG/ACT nasal spray Place 1 spray into both nostrils daily. 05/06/19   Sharion Balloon, NP  hydrochlorothiazide (HYDRODIURIL) 25 MG tablet Take 25 mg by mouth daily.    [provider]  meclizine (ANTIVERT) 12.5 MG  tablet Take 1 tablet (12.5 mg total) by mouth 3 (three) times daily as needed for dizziness. 05/26/19   Sharion Balloon, NP  meloxicam (MOBIC) 15 MG tablet Take 15 mg by mouth daily. 04/12/18   [provider]  Multiple Vitamin (MULTIVITAMIN) capsule Take by mouth.    [provider]  potassium chloride SA (K-DUR,KLOR-CON) 20 MEQ tablet Take 20 mEq by mouth 2 (two) times daily.    [provider]  diphenhydrAMINE (BENADRYL) 25 mg capsule Take by mouth. 03/10/16 06/21/19  [provider]  LYRICA 50 MG capsule Take 50 mg by mouth 3 (three) times daily. 04/15/18 06/07/19  [provider]    Allergies Baclofen, Doxycycline, Percocet [oxycodone-acetaminophen], Septra [sulfamethoxazole-trimethoprim], and Gabapentin   REVIEW OF SYSTEMS  Negative except as noted here or in the History of Present Illness.   PHYSICAL EXAMINATION  Initial Vital Signs Blood pressure (!) 149/90, pulse 88, temperature 98.1 F (36.7 C), temperature source Oral, resp. rate 20, height 5\' 7"  (1.702 m), weight 121.6 kg, SpO2 95 %.  Examination General: Well-developed, well-nourished female in no acute distress; appearance consistent with age of record HENT: normocephalic; atraumatic Eyes: pupils equal, round and reactive to light; extraocular muscles intact Neck: supple Heart: regular rate and rhythm Lungs: Mildly coarse airway sounds bilaterally Abdomen: soft; nondistended; nontender; bowel  sounds present Extremities: No deformity; full range of motion; pulses normal Neurologic: Awake, alert and oriented; motor function intact in all extremities and symmetric; no facial droop Skin: Warm and dry Psychiatric: Normal mood and affect   RESULTS  Summary of this visit's results, reviewed by myself:   EKG Interpretation  Date/Time:    Ventricular Rate:    PR Interval:    QRS Duration:   QT Interval:    QTC Calculation:   R Axis:     Text Interpretation:        Laboratory  Studies: No results found for this or any previous visit (from the past 24 hour(s)). Imaging Studies: No results found.  ED COURSE and MDM  Nursing notes and initial vitals signs, including pulse oximetry, reviewed.  Vitals:   06/21/19 0453  BP: (!) 149/90  Pulse: 88  Resp: 20  Temp: 98.1 F (36.7 C)  TempSrc: Oral  SpO2: 95%  Weight: 121.6 kg  Height: 5\' 7"  (1.702 m)   Mary Chambers was evaluated in Emergency Department on 06/21/2019 for the symptoms described in the history of present illness. She was evaluated in the context of the global COVID-19 pandemic, which necessitated consideration that the patient might be at risk for infection with the SARS-CoV-2 virus that causes COVID-19. Institutional protocols and algorithms that pertain to the evaluation of patients at risk for COVID-19 are in a state of rapid change based on information released by regulatory bodies including the CDC and federal and state organizations. These policies and algorithms were followed during the patient's care in the ED.  We will provide the patient with an inhaler and instruct her in its use.  She was advised to continue Mucinex.  PROCEDURES    ED DIAGNOSES     ICD-10-CM   1. Suspected Covid-19 Virus Infection  R68.89        Meredeth Furber, Jenny Reichmann, MD 06/21/19 [phone]

## 2019-06-21 NOTE — ED Triage Notes (Signed)
Pt is c/o cough and congestion x 2 days  Pt states she has been using OTC medication but is getting worse instead of better

## 2019-06-22 LAB — SARS CORONAVIRUS 2 (TAT 6-24 HRS): SARS Coronavirus 2: NEGATIVE

## 2019-07-20 ENCOUNTER — Other Ambulatory Visit: Payer: Self-pay

## 2019-07-20 ENCOUNTER — Ambulatory Visit
Admission: EM | Admit: 2019-07-20 | Discharge: 2019-07-20 | Disposition: A | Discharge status: Home / Self Care | Payer: Medicaid Other | Attending: Physician Assistant | Admitting: Physician Assistant

## 2019-07-20 ENCOUNTER — Encounter: Payer: Self-pay | Admitting: Emergency Medicine

## 2019-07-20 DIAGNOSIS — B9689 Other specified bacterial agents as the cause of diseases classified elsewhere: Secondary | ICD-10-CM | POA: Diagnosis not present

## 2019-07-20 DIAGNOSIS — M545 Low back pain, unspecified: Secondary | ICD-10-CM

## 2019-07-20 DIAGNOSIS — Z1159 Encounter for screening for other viral diseases: Secondary | ICD-10-CM

## 2019-07-20 DIAGNOSIS — J029 Acute pharyngitis, unspecified: Secondary | ICD-10-CM | POA: Diagnosis present

## 2019-07-20 DIAGNOSIS — N3090 Cystitis, unspecified without hematuria: Secondary | ICD-10-CM

## 2019-07-20 LAB — POCT URINALYSIS DIP (MANUAL ENTRY)
Bilirubin, UA: NEGATIVE
Glucose, UA: NEGATIVE mg/dL
Ketones, POC UA: NEGATIVE mg/dL
Nitrite, UA: NEGATIVE
Protein Ur, POC: NEGATIVE mg/dL
Spec Grav, UA: 1.02 (ref 1.010–1.025)
Urobilinogen, UA: 0.2 E.U./dL
pH, UA: 6.5 (ref 5.0–8.0)

## 2019-07-20 MED ORDER — CEPHALEXIN 500 MG PO CAPS: 500.0000 mg | ORAL_CAPSULE | Freq: Two times a day (BID) | ORAL | 0 refills | Status: DC

## 2019-07-20 MED ORDER — TIZANIDINE HCL 4 MG PO TABS: 4.0000 mg | ORAL_TABLET | Freq: Four times a day (QID) | ORAL | 0 refills | Status: DC | PRN

## 2019-07-20 NOTE — ED Provider Notes (Signed)
[facility]    CSN: [id number] Arrival date & time: 07/20/19  1022      History   Chief Complaint Chief Complaint  Patient presents with  . APPT: 1030am  . Dysuria    HPI Mary Chambers is a 48 y.o. female.   48 year old female comes in for multiple complaints.  1.  Few day history of urinary symptoms.  Has had right lower quadrant "spasm" before needing to urinate.  Has had urinary frequency.  Denies dysuria, hematuria.  Has a history of kidney stones, for which she does not think feels similar.  Denies abdominal pain other than the spasm before urination.  Denies nausea, vomiting.  Denies fever, chills.  States has back pain that she thinks is more muscular skeletal.  Denies vaginal symptoms such as discharge, itching.  History of hysterectomy.  2.  Right lower back pain.  States pain to the buttock area, without injury or trauma.  Pain is constant, dull without any radiation.  Denies exacerbating or alleviating factor.  Denies saddle anesthesia, loss of bladder or bowel control.  Has not taken anything for symptoms.  3.  2-day history of URI symptoms.  Has had right ear fullness, right-sided throat pain.  States feels as if drainage is going from ear to throat.  Denies rhinorrhea, nasal congestion, cough.  Denies shortness of breath, loss of taste or smell.  Patient's daughter is also in office due to URI symptoms.  No other sick contacts.     Past Medical History:  Diagnosis Date  . Hypertension   . Kidney stone     Patient Active Problem List   Diagnosis Date Noted  . Pain in left foot 03/29/2018  . Arthritis 02/13/2018  . Chronic back pain 02/13/2018  . Migraines 02/13/2018  . Morbid obesity with BMI of 40.0-44.9, adult ([place]) 02/13/2018  . Synovial cyst of lumbar spine 10/15/2017  . Osteoarthritis of knee 07/13/2017  . Lumbar disc disease with radiculopathy 08/01/2016  . Fatigue 05/04/2016  . Headache 05/04/2016  . Numbness of foot 05/04/2016   . 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 Arthrodesis status 02/02/2016  . Chronic pain syndrome 02/02/2016  . Elevated white blood cell count 12/01/2015  . Lumbar stenosis with neurogenic claudication 05/07/2015  . Bilateral leg edema 04/21/2015  . Carpal tunnel syndrome of right wrist 12/14/2014  . Renal atrophy, left 01/23/2014  . Achilles tendonitis 01/16/2014  . Right lower quadrant pain 12/18/2013  . Hypertension, benign 06/03/2010  . Urinary incontinence 06/03/2010  . Cervical disc disease 09/09/2009  . 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 ABDOMINAL HYSTERECTOMY    . BACK SURGERY    . CARPAL TUNNEL RELEASE    . CHOLECYSTECTOMY    . LITHOTRIPSY    . neck fusion    . TUBAL LIGATION      OB History   No obstetric history on file.      Home Medications    Prior to Admission medications   Medication Sig Start Date End Date Taking? Authorizing Provider  cephALEXin (KEFLEX) 500 MG capsule Take 1 capsule (500 mg total) by mouth 2 (two) times daily. 07/20/19   Tasia Catchings,  V, PA-C  cetirizine (ZYRTEC) 10 MG chewable tablet Chew 1 tablet (10 mg total) by mouth daily. 05/06/19 06/05/19  Sharion Balloon, NP  enalapril (VASOTEC) 10 MG tablet Take 10 mg by mouth daily.    [provider]  hydrochlorothiazide (HYDRODIURIL) 25 MG  tablet Take 25 mg by mouth daily.    [provider]  Multiple Vitamin (MULTIVITAMIN) capsule Take by mouth.    [provider]  potassium chloride SA (K-DUR,KLOR-CON) 20 MEQ tablet Take 20 mEq by mouth 2 (two) times daily.    [provider]  tiZANidine (ZANAFLEX) 4 MG tablet Take 1 tablet (4 mg total) by mouth every 6 (six) hours as needed for muscle spasms. 07/20/19   Tasia Catchings,  V, PA-C  diphenhydrAMINE (BENADRYL) 25 mg capsule Take by mouth. 03/10/16 06/21/19  [provider]  fluticasone (FLONASE) 50 MCG/ACT nasal spray Place 1 spray into both nostrils daily. 05/06/19 07/20/19   Sharion Balloon, NP  LYRICA 50 MG capsule Take 50 mg by mouth 3 (three) times daily. 04/15/18 06/07/19  [provider]    Family History Family History  Problem Relation Age of Onset  . Diabetes Mother   . Hypertension Mother   . Cancer Father   . Parkinson's disease Father     Social History Social History   Tobacco Use  . Smoking status: Never Smoker  . Smokeless tobacco: Never Used  Substance Use Topics  . Alcohol use: No  . Drug use: Never     Allergies   Baclofen, Doxycycline, Percocet [oxycodone-acetaminophen], Septra [sulfamethoxazole-trimethoprim], and Gabapentin   Review of Systems Review of Systems  Reason unable to perform ROS: See HPI as above.     Physical Exam Triage Vital Signs ED Triage Vitals  Enc Vitals Group     BP 07/20/19 1032 (!) 141/78     Pulse Rate 07/20/19 1032 93     Resp 07/20/19 1032 18     Temp 07/20/19 1032 98 F (36.7 C)     Temp Source 07/20/19 1032 Oral     SpO2 07/20/19 1032 97 %     Weight --      Height --      Head Circumference --      Peak Flow --      Pain Score 07/20/19 1033 3     Pain Loc --      Pain Edu? --      Excl. in [place]? --    No data found.  Updated Vital Signs BP (!) 141/78 (BP Location: Right Arm)   Pulse 93   Temp 98 F (36.7 C) (Oral)   Resp 18   SpO2 97%   Physical Exam Constitutional:      General: She is not in acute distress.    Appearance: She is well-developed. She is not ill-appearing, toxic-appearing or diaphoretic.  HENT:     Head: Normocephalic and atraumatic.     Nose:     Right Sinus: No maxillary sinus tenderness or frontal sinus tenderness.     Left Sinus: No maxillary sinus tenderness or frontal sinus tenderness.     Mouth/Throat:     Mouth: Mucous membranes are moist.     Pharynx: Oropharynx is clear. Uvula midline.     Tonsils: No tonsillar exudate.  Eyes:     Conjunctiva/sclera: Conjunctivae normal.     Pupils: Pupils are equal, round, and reactive to light.   Cardiovascular:     Rate and Rhythm: Normal rate and regular rhythm.     Heart sounds: Normal heart sounds. No murmur. No friction rub. No gallop.   Pulmonary:     Effort: Pulmonary effort is normal.     Breath sounds: Normal breath sounds. No wheezing or rales.  Abdominal:  General: Bowel sounds are normal.     Palpations: Abdomen is soft.     Tenderness: There is no abdominal tenderness. There is no right CVA tenderness, left CVA tenderness, guarding or rebound.  Musculoskeletal:     Comments: No tenderness to palpation of spinous processes.  Tenderness to palpation of right buttock/sacral region.  No tenderness to palpation of bilateral hips.  Full range of motion of back and hips.  Negative straight leg raise.  Skin:    General: Skin is warm and dry.  Neurological:     Mental Status: She is alert and oriented to person, place, and time.  Psychiatric:        Behavior: Behavior normal.        Judgment: Judgment normal.      UC Treatments / Results  Labs (all labs ordered are listed, but only abnormal results are displayed) Labs Reviewed  POCT URINALYSIS DIP (MANUAL ENTRY) - Abnormal; Notable for the following components:      Result Value   Clarity, UA cloudy (*)    Blood, UA trace-intact (*)    Leukocytes, UA Small (1+) (*)    All other components within normal limits  URINE CULTURE  NOVEL CORONAVIRUS, NAA    EKG   Radiology No results found.  Procedures Procedures (including critical care time)  Medications Ordered in UC Medications - No data to display  Initial Impression / Assessment and Plan / UC Course  I have reviewed the triage vital signs and the nursing notes.  Pertinent labs & imaging results that were available during my care of the patient were reviewed by me and considered in my medical decision making (see chart for details).    1.  Cystitis Urine with blood and leuks.  Will treat for UTI with Keflex.  Push fluids.  Return precautions  given.  2.  Sore throat With sick contact, discussed with patient cannot rule out Covid, will send for testing.  Patient to remain in quarantine until testing results return.  Symptomatic treatment discussed.  Push fluids.  Return precautions given.  3.  Right low back pain without sciatica. Will have patient take tizanidine at this time to help with symptoms.  Can also supplement with ibuprofen/Tylenol.  Warm compress to the region.  Return precautions given.  Patient expresses understanding and agrees to plan.  Final Clinical Impressions(s) / UC Diagnoses   Final diagnoses:  Cystitis  Sore throat  Acute right-sided low back pain without sciatica    ED Prescriptions    Medication Sig Dispense Auth. Provider   tiZANidine (ZANAFLEX) 4 MG tablet Take 1 tablet (4 mg total) by mouth every 6 (six) hours as needed for muscle spasms. 20 tablet ,  V, PA-C   cephALEXin (KEFLEX) 500 MG capsule Take 1 capsule (500 mg total) by mouth 2 (two) times daily. 10 capsule Ok Edwards, PA-C     PDMP not reviewed this encounter.   Ok Edwards, PA-C 07/20/19 1236

## 2019-07-20 NOTE — ED Notes (Signed)
Patient able to ambulate independently  

## 2019-07-20 NOTE — Discharge Instructions (Signed)
UTI Your urine was positive for an urinary tract infection. Start keflex as directed. Keep hydrated, urine should be clear to pale yellow in color. Monitor for any worsening of symptoms, fever, worsening abdominal pain, nausea/vomiting, flank pain, follow up for reevaluation.   Sore throat As discussed, cannot rule out COVID. Currently, no alarming signs. Testing ordered. I would like you to quarantine until testing results. Start flonase as directed. If experiencing shortness of breath, trouble breathing, go to the ED for further evaluation needed.   Back pain Tizanidine as needed, this can make you drowsy, so do not take if you are going to drive, operate heavy machinery, or make important decisions. Ice/heat compresses as needed. This can take up to 3-4 weeks to completely resolve, but you should be feeling better each week. Follow up with PCP if symptoms worsen, changes for reevaluation. If experience numbness/tingling of the inner thighs, loss of bladder or bowel control, go to the emergency department for evaluation.

## 2019-07-20 NOTE — ED Triage Notes (Signed)
Pt presents to [facility] for assessment of lower abdominal "spasms" right before needing to urinate and urinary frequency.  Pt c/o right sided back pain worsening last night, states she can't lay on that side.

## 2019-07-21 ENCOUNTER — Telehealth: Payer: Self-pay

## 2019-07-23 LAB — URINE CULTURE: Culture: 60000 — AB

## 2019-07-23 LAB — NOVEL CORONAVIRUS, NAA: SARS-CoV-2, NAA: NOT DETECTED

## 2019-07-24 ENCOUNTER — Telehealth (HOSPITAL_COMMUNITY): Payer: Self-pay | Admitting: Emergency Medicine

## 2019-07-24 NOTE — Telephone Encounter (Signed)
Urine culture was positive for e coli and was given keflex  at urgent care visit. Pt contacted and made aware, educated on completing antibiotic and to follow up if symptoms are persistent. Verbalized understanding.  Pt has pain from kidney stone and has follow up with urology already.

## 2019-07-25 ENCOUNTER — Emergency Department (HOSPITAL_COMMUNITY)
Admission: EM | Admit: 2019-07-25 | Discharge: 2019-07-26 | Disposition: A | Discharge status: Home / Self Care | Payer: Medicaid Other | Attending: Emergency Medicine | Admitting: Emergency Medicine

## 2019-07-25 ENCOUNTER — Encounter (HOSPITAL_COMMUNITY): Payer: Self-pay

## 2019-07-25 ENCOUNTER — Emergency Department (HOSPITAL_COMMUNITY): Payer: Medicaid Other

## 2019-07-25 ENCOUNTER — Other Ambulatory Visit: Payer: Self-pay

## 2019-07-25 DIAGNOSIS — Z79899 Other long term (current) drug therapy: Secondary | ICD-10-CM | POA: Diagnosis not present

## 2019-07-25 DIAGNOSIS — R1031 Right lower quadrant pain: Secondary | ICD-10-CM | POA: Diagnosis present

## 2019-07-25 DIAGNOSIS — N200 Calculus of kidney: Secondary | ICD-10-CM | POA: Diagnosis not present

## 2019-07-25 DIAGNOSIS — Z20828 Contact with and (suspected) exposure to other viral communicable diseases: Secondary | ICD-10-CM | POA: Insufficient documentation

## 2019-07-25 DIAGNOSIS — R519 Headache, unspecified: Secondary | ICD-10-CM | POA: Insufficient documentation

## 2019-07-25 DIAGNOSIS — R112 Nausea with vomiting, unspecified: Secondary | ICD-10-CM | POA: Insufficient documentation

## 2019-07-25 DIAGNOSIS — I10 Essential (primary) hypertension: Secondary | ICD-10-CM | POA: Insufficient documentation

## 2019-07-25 LAB — DIFFERENTIAL
Abs Immature Granulocytes: 0.04 10*3/uL (ref 0.00–0.07)
Basophils Absolute: 0 10*3/uL (ref 0.0–0.1)
Basophils Relative: 0 %
Eosinophils Absolute: 0 10*3/uL (ref 0.0–0.5)
Eosinophils Relative: 0 %
Immature Granulocytes: 0 %
Lymphocytes Relative: 10 %
Lymphs Abs: 1.3 10*3/uL (ref 0.7–4.0)
Monocytes Absolute: 0.3 10*3/uL (ref 0.1–1.0)
Monocytes Relative: 3 %
Neutro Abs: 10.6 10*3/uL — ABNORMAL HIGH (ref 1.7–7.7)
Neutrophils Relative %: 87 %

## 2019-07-25 LAB — CBC
HCT: 41.8 % (ref 36.0–46.0)
Hemoglobin: 13.6 g/dL (ref 12.0–15.0)
MCH: 28.2 pg (ref 26.0–34.0)
MCHC: 32.5 g/dL (ref 30.0–36.0)
MCV: 86.7 fL (ref 80.0–100.0)
Platelets: 289 10*3/uL (ref 150–400)
RBC: 4.82 MIL/uL (ref 3.87–5.11)
RDW: 13.5 % (ref 11.5–15.5)
WBC: 12.5 10*3/uL — ABNORMAL HIGH (ref 4.0–10.5)
nRBC: 0 % (ref 0.0–0.2)

## 2019-07-25 LAB — COMPREHENSIVE METABOLIC PANEL
ALT: 14 U/L (ref 0–44)
AST: 17 U/L (ref 15–41)
Albumin: 3.6 g/dL (ref 3.5–5.0)
Alkaline Phosphatase: 51 U/L (ref 38–126)
Anion gap: 10 (ref 5–15)
BUN: 16 mg/dL (ref 6–20)
CO2: 23 mmol/L (ref 22–32)
Calcium: 8.7 mg/dL — ABNORMAL LOW (ref 8.9–10.3)
Chloride: 104 mmol/L (ref 98–111)
Creatinine, Ser: 0.88 mg/dL (ref 0.44–1.00)
GFR calc Af Amer: >60 mL/min (ref >60)
GFR calc non Af Amer: >60 mL/min (ref >60)
Glucose, Bld: 122 mg/dL — ABNORMAL HIGH (ref 70–99)
Potassium: 3.9 mmol/L (ref 3.5–5.1)
Sodium: 137 mmol/L (ref 135–145)
Total Bilirubin: 0.6 mg/dL (ref 0.3–1.2)
Total Protein: 7.1 g/dL (ref 6.5–8.1)

## 2019-07-25 LAB — URINALYSIS, ROUTINE W REFLEX MICROSCOPIC
Bacteria, UA: NONE SEEN
Bilirubin Urine: NEGATIVE
Glucose, UA: NEGATIVE mg/dL
Ketones, ur: NEGATIVE mg/dL
Leukocytes,Ua: NEGATIVE
Nitrite: NEGATIVE
Protein, ur: NEGATIVE mg/dL
Specific Gravity, Urine: 1.012 (ref 1.005–1.030)
pH: 7 (ref 5.0–8.0)

## 2019-07-25 LAB — LIPASE, BLOOD: Lipase: 25 U/L (ref 11–51)

## 2019-07-25 LAB — I-STAT BETA HCG BLOOD, ED (MC, WL, AP ONLY): I-stat hCG, quantitative: <5 m[IU]/mL (ref <5)

## 2019-07-25 IMAGING — CT CT ABD-PELV W/ CM
2 of 5 series · 16 of 46 positions shown, 18 images · IV contrast (omnipaque)
Comparison: None.

CLINICAL DATA: Fever, right lower quadrant abdominal pain

EXAM:
CT ABDOMEN AND PELVIS WITH CONTRAST
TECHNIQUE: Multidetector CT imaging of the abdomen and pelvis was performed
using the standard protocol following bolus administration of
intravenous contrast.
CONTRAST:  100mL OMNIPAQUE IOHEXOL 300 MG/ML  SOLN

[Series 2: axial st · axial · 0.88mm/px · z∈[-462,-22]mm · 13 of 102 slices shown, 15 images]
[im 7/102  soft-tissue]
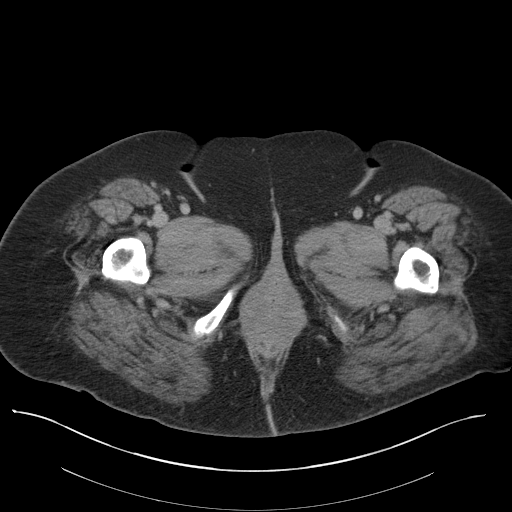
[im 7/102  bone]
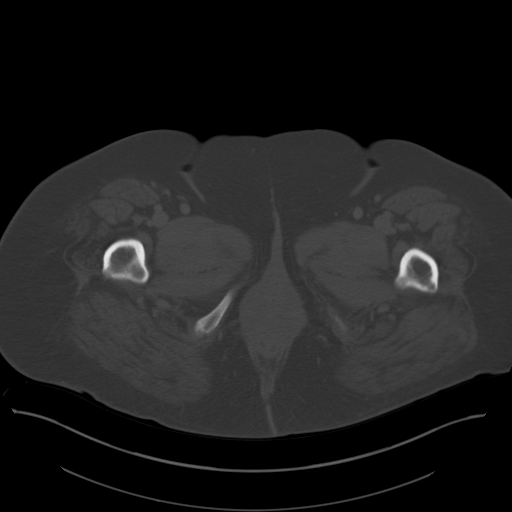
[im 13/102  soft-tissue]
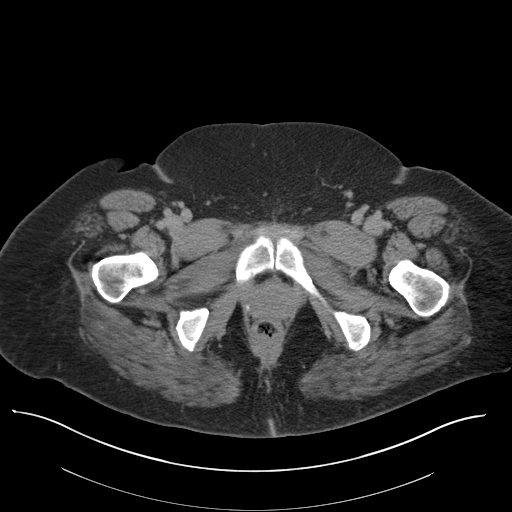
[im 19/102  soft-tissue]
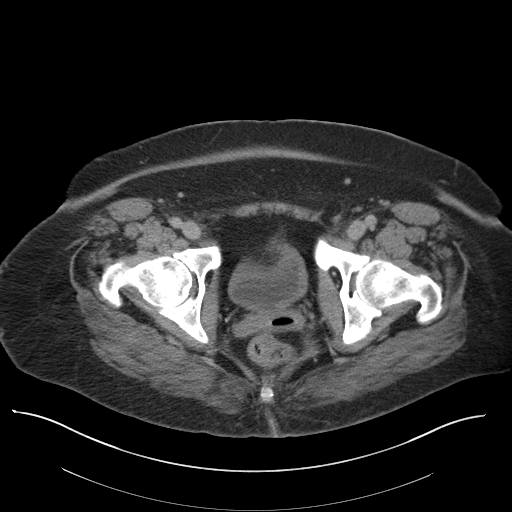
[im 32/102  soft-tissue]
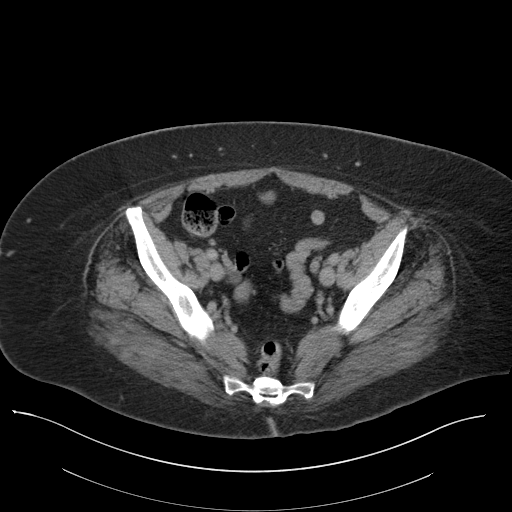
[im 38/102  soft-tissue]
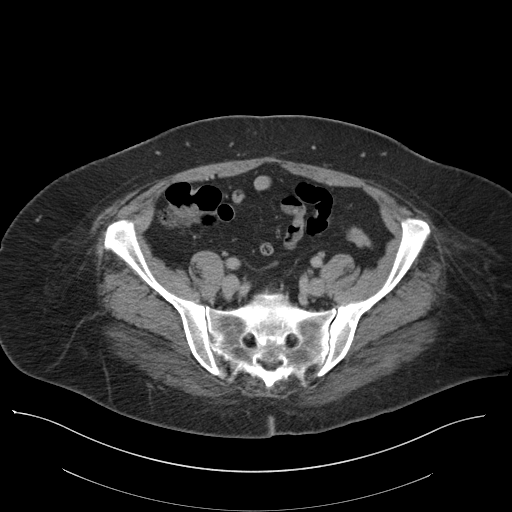
[im 45/102  soft-tissue]
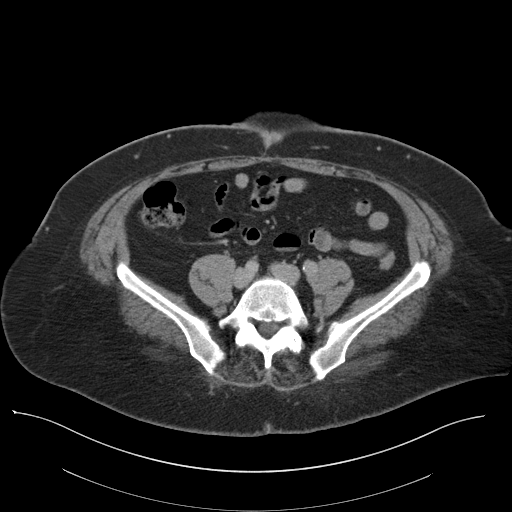
[im 51/102  soft-tissue]
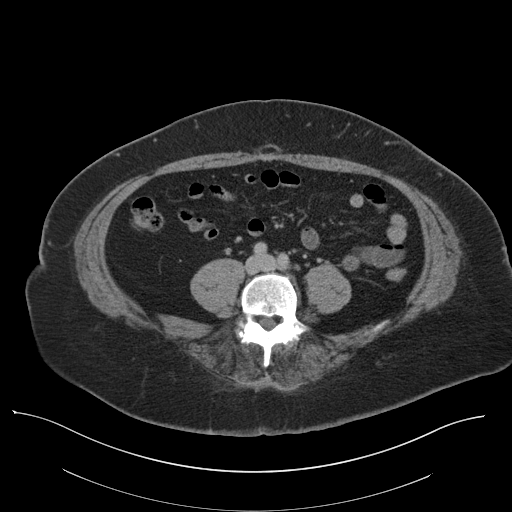
[im 57/102  soft-tissue]
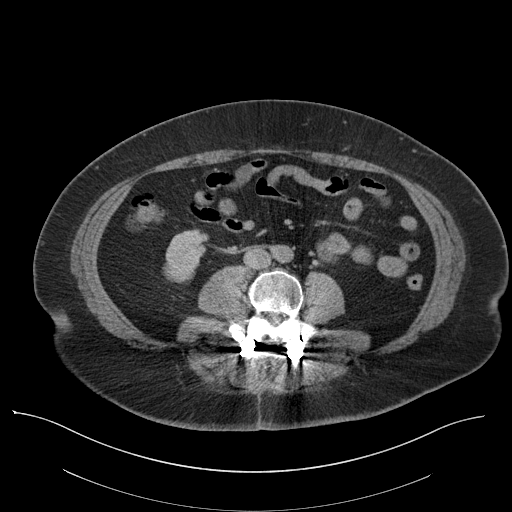
[im 64/102  soft-tissue]
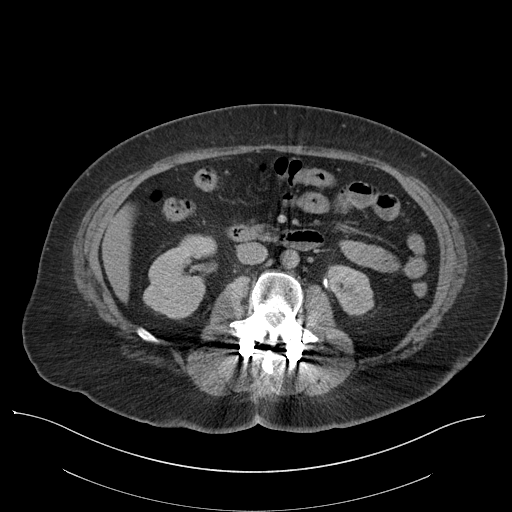
[im 64/102  bone]
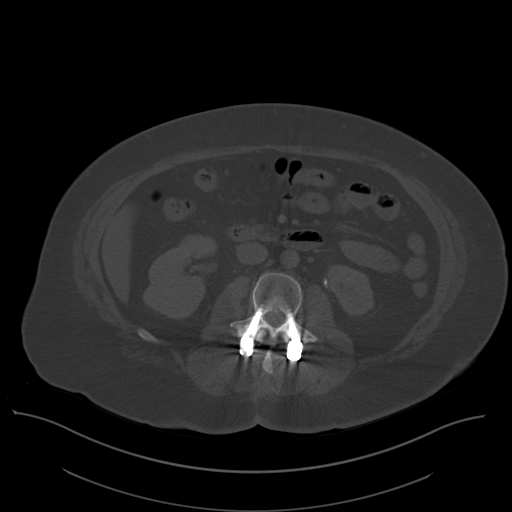
[im 70/102  soft-tissue]
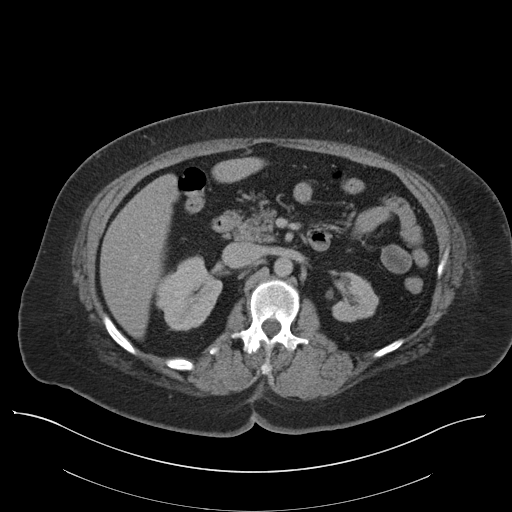
[im 83/102  soft-tissue]
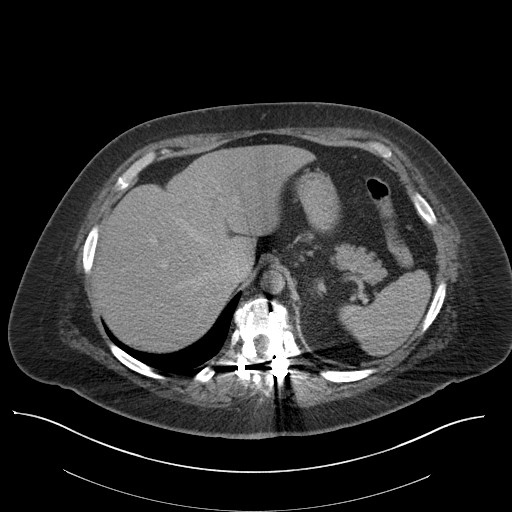
[im 89/102  soft-tissue]
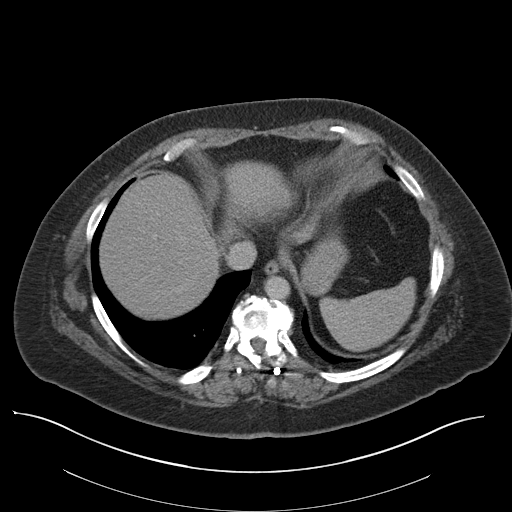
[im 95/102  soft-tissue]
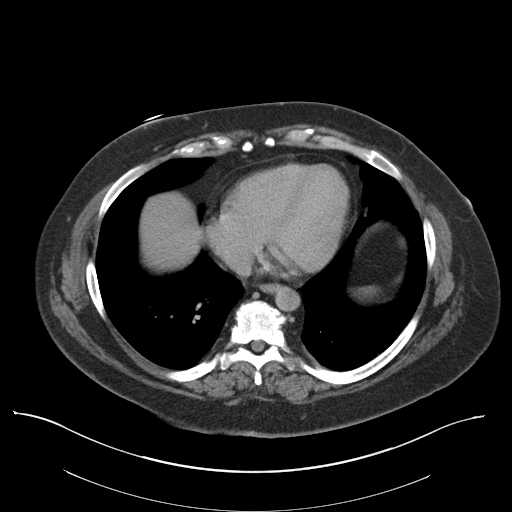

[Series 5: coronal st · coronal · 0.97mm/px · 3 of 146 slices shown]
[im 49/146  soft-tissue]
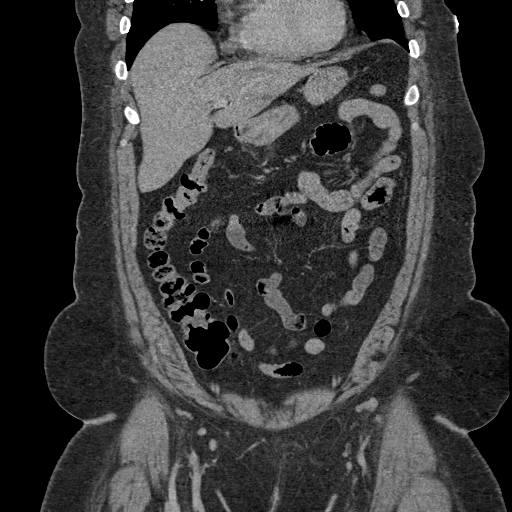
[im 65/146  soft-tissue]
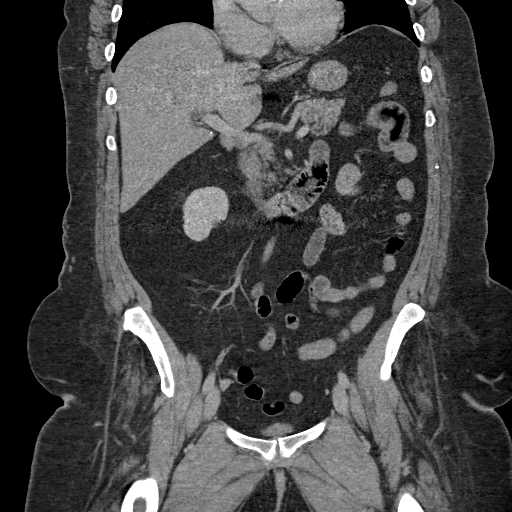
[im 81/146  soft-tissue]
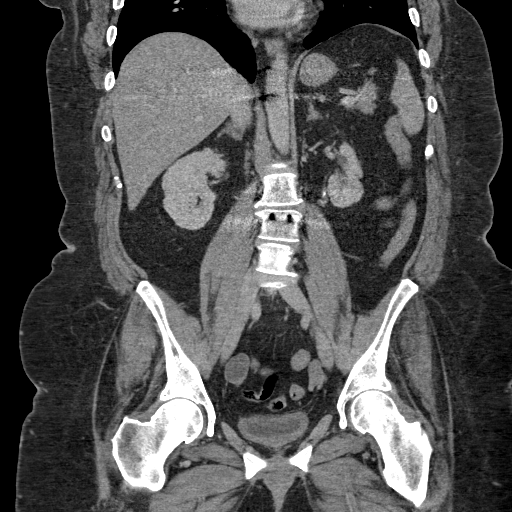

[16 of 46 positions shown; findings below may reference images not displayed]

FINDINGS: Lower chest: The visualized heart size within normal limits. No
pericardial fluid/thickening.

No hiatal hernia.

The visualized portions of the lungs are clear.

Hepatobiliary: The liver is normal in density without focal
abnormality.The main portal vein is patent. No evidence of calcified
gallstones, gallbladder wall thickening or biliary dilatation.

Pancreas: Unremarkable. No pancreatic ductal dilatation or
surrounding inflammatory changes.

Spleen: Normal in size without focal abnormality.

Adrenals/Urinary Tract: Both adrenal glands appear normal. There is
a 7 mm left proximal ureteral calculus causing mild left
pelvicaliectasis. Again noted is left renal atrophy. There is areas
of scarring and a lobular appearance to both kidneys. No right-sided
renal or collecting system calculi are seen.

Stomach/Bowel: The stomach, small bowel, and colon are normal in
appearance. No inflammatory changes, wall thickening, or obstructive
findings.The appendix is normal.

Vascular/Lymphatic: There are no enlarged mesenteric,
retroperitoneal, or pelvic lymph nodes. Scattered mild aortic
atherosclerotic calcifications are seen without aneurysmal
dilatation.

Reproductive: The adnexa is unremarkable.

Other: Small fat containing anterior umbilical hernia.

Musculoskeletal: No acute or significant osseous findings. Surgical
fixation hardware seen in the lower thoracic spine and at L2-L3.
IMPRESSION: 7 mm left proximal ureteral calculus causing mild pelvicaliectasis.

No other acute intra-abdominal or pelvic pathology to explain the
patient's symptoms.

## 2019-07-25 MED ORDER — SODIUM CHLORIDE (PF) 0.9 % IJ SOLN
INTRAMUSCULAR | Status: AC
  Administered 2019-07-25: 21:00:00 10 mL
  Filled 2019-07-25: qty 50

## 2019-07-25 MED ORDER — ONDANSETRON 4 MG PO TBDP: 4.0000 mg | ORAL_TABLET | Freq: Three times a day (TID) | ORAL | 0 refills | Status: DC | PRN

## 2019-07-25 MED ORDER — OXYCODONE HCL 5 MG PO TABS: 5.0000 mg | ORAL_TABLET | ORAL | 0 refills | Status: DC | PRN

## 2019-07-25 MED ORDER — IOHEXOL 300 MG/ML  SOLN
100.0000 mL | Freq: Once | INTRAMUSCULAR | Status: AC | PRN
  Administered 2019-07-25: 100 mL via INTRAVENOUS

## 2019-07-25 MED ORDER — ONDANSETRON 4 MG PO TBDP
4.0000 mg | ORAL_TABLET | Freq: Once | ORAL | Status: AC | PRN
  Administered 2019-07-25: 19:00:00 4 mg via ORAL
  Filled 2019-07-25: qty 1

## 2019-07-25 MED ORDER — METOCLOPRAMIDE HCL 5 MG/ML IJ SOLN
10.0000 mg | Freq: Once | INTRAMUSCULAR | Status: AC
  Administered 2019-07-25: 21:00:00 10 mg via INTRAVENOUS
  Filled 2019-07-25: qty 2

## 2019-07-25 MED ORDER — MORPHINE SULFATE (PF) 4 MG/ML IV SOLN
4.0000 mg | Freq: Once | INTRAVENOUS | Status: AC
  Administered 2019-07-25: 21:00:00 4 mg via INTRAVENOUS
  Filled 2019-07-25: qty 1

## 2019-07-25 MED ORDER — SODIUM CHLORIDE 0.9% FLUSH
3.0000 mL | Freq: Once | INTRAVENOUS | Status: AC
  Administered 2019-07-25: 20:00:00 3 mL via INTRAVENOUS

## 2019-07-25 MED ORDER — SODIUM CHLORIDE 0.9 % IV BOLUS
1000.0000 mL | Freq: Once | INTRAVENOUS | Status: AC
  Administered 2019-07-25: 21:00:00 1000 mL via INTRAVENOUS

## 2019-07-25 NOTE — ED Provider Notes (Signed)
[facility] DEPT Provider Note   CSN: [id number] Arrival date & time: 07/25/19  1845     History   Chief Complaint Chief Complaint  Patient presents with  . Nausea  . Emesis  . Abdominal Pain    HPI Mary Chambers is a 48 y.o. female with a history of hypertension, obesity, partial hysterectomy, cholecystectomy, presenting to the emergency department with right lower quadrant pain nausea and vomiting.  Patient was diagnosed with a UTI during ED visit 5 days ago on October 18.  She was started on Keflex which she has completed.  Her urine culture was positive for E. coli which was pan susceptible.  Patient reports improvement of her cloudy urine.  She was also tested negative for COVID-19 at that time.  She subsequently developed right-sided flank pain the next day on October 19.  She went to [facility] emergency department where she had a CT scan.  The scan showed a 5 mm stone at the left UVJ, but no stones in the right sided urinary system.  On October 21 she was seen by urologist at [facility].  The urologist note on care everywhere states that they believe that they see a calcification right lower pelvis which may be a pelvic phlebolith is close to the ureter.  They determined that they would need an IVP to determine if a stone exists, the patient is scheduled to have this done early next week.  They did start the patient on Flomax.  The patient presents back to the ED with new complaints.  She states that yesterday she began feeling diffusely weak and malaise.  This afternoon she was trying to shop and began having nausea with vomiting.  She has had copious vomiting throughout the afternoon.  She states that prior to this she received a flu shot at [employer].  She also states is arriving the ED she now has a headache.  She reports that the pain in her right lower quadrant has worsened and is now localized.  It is no longer in her right flank.  She reports she has  had no problems with bowel movements, has a daily bowel movement, and is nonbloody.  Allergies - listed as percocet but patient reports she has had tylenol and oxycodone separately with NO reaction.  She states her allergy is specific to the "preservative in percocet."  She has also had other opioids (morphine) with NO reaction.     HPI  Past Medical History:  Diagnosis Date  . Hypertension   . Kidney stone     Patient Active Problem List   Diagnosis Date Noted  . Pain in left foot 03/29/2018  . Arthritis 02/13/2018  . Chronic back pain 02/13/2018  . Migraines 02/13/2018  . Morbid obesity with BMI of 40.0-44.9, adult ([place]) 02/13/2018  . Synovial cyst of lumbar spine 10/15/2017  . Osteoarthritis of knee 07/13/2017  . Lumbar disc disease with radiculopathy 08/01/2016  . Fatigue 05/04/2016  . Headache 05/04/2016  . Numbness of foot 05/04/2016  . 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 Arthrodesis status 02/02/2016  . Chronic pain syndrome 02/02/2016  . Elevated white blood cell count 12/01/2015  . Lumbar stenosis with neurogenic claudication 05/07/2015  . Bilateral leg edema 04/21/2015  . Carpal tunnel syndrome of right wrist 12/14/2014  . Renal atrophy, left 01/23/2014  . Achilles tendonitis 01/16/2014  . Right lower quadrant pain 12/18/2013  . Hypertension, benign 06/03/2010  . Urinary incontinence 06/03/2010  . Cervical disc  disease 09/09/2009  . 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 ABDOMINAL HYSTERECTOMY    . BACK SURGERY    . CARPAL TUNNEL RELEASE    . CHOLECYSTECTOMY    . LITHOTRIPSY    . neck fusion    . TUBAL LIGATION       OB History   No obstetric history on file.      Home Medications    Prior to Admission medications   Medication Sig Start Date End Date Taking? Authorizing Provider  acetaminophen (TYLENOL) 500 MG tablet Take 500 mg by mouth every 6 (six) hours as needed for moderate pain.   Yes  [provider]  enalapril (VASOTEC) 10 MG tablet Take 10 mg by mouth daily.   Yes [provider]  hydrochlorothiazide (HYDRODIURIL) 25 MG tablet Take 25 mg by mouth daily.   Yes [provider]  Multiple Vitamin (MULTIVITAMIN) capsule Take 1 capsule by mouth daily.    Yes [provider]  potassium chloride SA (K-DUR,KLOR-CON) 20 MEQ tablet Take 20 mEq by mouth 2 (two) times daily.   Yes [provider]  tiZANidine (ZANAFLEX) 4 MG tablet Take 1 tablet (4 mg total) by mouth every 6 (six) hours as needed for muscle spasms. 07/20/19  Yes Yu, Amy V, PA-C  cephALEXin (KEFLEX) 500 MG capsule Take 1 capsule (500 mg total) by mouth 2 (two) times daily. Patient not taking: Reported on 07/25/2019 07/20/19   Ok Edwards, PA-C  cetirizine (ZYRTEC) 10 MG chewable tablet Chew 1 tablet (10 mg total) by mouth daily. 05/06/19 06/05/19  Sharion Balloon, NP  ondansetron (ZOFRAN ODT) 4 MG disintegrating tablet Take 1 tablet (4 mg total) by mouth every 8 (eight) hours as needed for up to 15 doses for nausea or vomiting. 07/25/19   Wyvonnia Dusky, MD  oxyCODONE (ROXICODONE) 5 MG immediate release tablet Take 1 tablet (5 mg total) by mouth every 4 (four) hours as needed for up to 8 doses for severe pain or breakthrough pain. 07/25/19   Wyvonnia Dusky, MD  diphenhydrAMINE (BENADRYL) 25 mg capsule Take by mouth. 03/10/16 06/21/19  [provider]  fluticasone (FLONASE) 50 MCG/ACT nasal spray Place 1 spray into both nostrils daily. 05/06/19 07/20/19  Sharion Balloon, NP  LYRICA 50 MG capsule Take 50 mg by mouth 3 (three) times daily. 04/15/18 06/07/19  [provider]    Family History Family History  Problem Relation Age of Onset  . Diabetes Mother   . Hypertension Mother   . Cancer Father   . Parkinson's disease Father     Social History Social History   Tobacco Use  . Smoking status: Never Smoker  . Smokeless tobacco: Never Used  Substance Use Topics  .  Alcohol use: No  . Drug use: Never     Allergies   Baclofen, Doxycycline, Percocet [oxycodone-acetaminophen], Septra [sulfamethoxazole-trimethoprim], and Gabapentin   Review of Systems Review of Systems  Constitutional: Positive for appetite change, chills and fatigue. Negative for fever.  Respiratory: Negative for cough and shortness of breath.   Cardiovascular: Negative for chest pain and palpitations.  Gastrointestinal: Positive for abdominal pain, nausea and vomiting. Negative for blood in stool, constipation and diarrhea.  Genitourinary: Negative for dysuria, flank pain, frequency, hematuria, vaginal bleeding and vaginal discharge.  Musculoskeletal: Positive for myalgias. Negative for arthralgias and back pain.  Neurological: Positive for headaches. Negative for syncope.  Psychiatric/Behavioral: Negative for agitation and confusion.  All other systems reviewed and are negative.    Physical Exam Updated Vital Signs BP 128/77 (BP Location: Left Arm)   Pulse 84   Temp 98.7 F (37.1 C) (Oral)   Resp 19   Ht 5' 7.5" (1.715 m)   Wt 121.1 kg   SpO2 99%   BMI 41.20 kg/m   Physical Exam Vitals signs and nursing note reviewed.  Constitutional:      General: She is not in acute distress.    Appearance: She is well-developed. She is obese.  HENT:     Head: Normocephalic and atraumatic.     Comments: Neck is supple Eyes:     Conjunctiva/sclera: Conjunctivae normal.  Neck:     Musculoskeletal: Neck supple.  Cardiovascular:     Rate and Rhythm: Normal rate and regular rhythm.     Heart sounds: Normal heart sounds. No murmur.  Pulmonary:     Effort: Pulmonary effort is normal. No respiratory distress.     Breath sounds: Normal breath sounds.  Abdominal:     Palpations: Abdomen is soft.     Tenderness: There is abdominal tenderness in the right lower quadrant. There is no guarding or rebound. Positive signs include McBurney's sign. Negative signs include Rovsing's sign.   Skin:    General: Skin is warm and dry.  Neurological:     Mental Status: She is alert.  Psychiatric:        Mood and Affect: Mood normal.        Behavior: Behavior normal.     ED Treatments / Results  Labs (all labs ordered are listed, but only abnormal results are displayed) Labs Reviewed  COMPREHENSIVE METABOLIC PANEL - Abnormal; Notable for the following components:      Result Value   Glucose, Bld 122 (*)    Calcium 8.7 (*)    All other components within normal limits  CBC - Abnormal; Notable for the following components:   WBC 12.5 (*)    All other components within normal limits  URINALYSIS, ROUTINE W REFLEX MICROSCOPIC - Abnormal; Notable for the following components:   Color, Urine STRAW (*)    Hgb urine dipstick SMALL (*)    All other components within normal limits  DIFFERENTIAL - Abnormal; Notable for the following components:   Neutro Abs 10.6 (*)    All other components within normal limits  SARS CORONAVIRUS 2 (TAT 6-24 HRS)  LIPASE, BLOOD  I-STAT BETA HCG BLOOD, ED ([facility], [facility], [facility] ONLY)    EKG None  Radiology Ct Abdomen Pelvis W Contrast  Result Date: 07/25/2019 CLINICAL DATA:  Fever, right lower quadrant abdominal pain EXAM: CT ABDOMEN AND PELVIS WITH CONTRAST TECHNIQUE: Multidetector CT imaging of the abdomen and pelvis was performed using the standard protocol following bolus administration of intravenous contrast. CONTRAST:  159mL OMNIPAQUE IOHEXOL 300 MG/ML  SOLN COMPARISON:  None. FINDINGS: Lower chest: The visualized heart size within normal limits. No pericardial fluid/thickening. No hiatal hernia. The visualized portions of the lungs are clear. Hepatobiliary: The liver is normal in density without focal abnormality.The main portal vein is patent. No evidence of calcified gallstones, gallbladder wall thickening or biliary dilatation. Pancreas: Unremarkable. No pancreatic ductal dilatation or surrounding inflammatory changes. Spleen: Normal in size without  focal abnormality. Adrenals/Urinary Tract: Both adrenal glands appear normal. There is a 7 mm left proximal ureteral calculus causing mild left pelvicaliectasis. Again noted is left renal atrophy. There is areas of scarring and a lobular appearance to both kidneys. No right-sided  renal or collecting system calculi are seen. Stomach/Bowel: The stomach, small bowel, and colon are normal in appearance. No inflammatory changes, wall thickening, or obstructive findings.The appendix is normal. Vascular/Lymphatic: There are no enlarged mesenteric, retroperitoneal, or pelvic lymph nodes. Scattered mild aortic atherosclerotic calcifications are seen without aneurysmal dilatation. Reproductive: The adnexa is unremarkable. Other: Small fat containing anterior umbilical hernia. Musculoskeletal: No acute or significant osseous findings. Surgical fixation hardware seen in the lower thoracic spine and at L2-L3. IMPRESSION: 7 mm left proximal ureteral calculus causing mild pelvicaliectasis. No other acute intra-abdominal or pelvic pathology to explain the patient's symptoms. Electronically Signed   By: Prudencio Pair M.D.   On: 07/25/2019 21:25    Procedures Procedures (including critical care time)  Medications Ordered in ED Medications  sodium chloride flush (NS) 0.9 % injection 3 mL (3 mLs Intravenous Given 07/25/19 1950)  ondansetron (ZOFRAN-ODT) disintegrating tablet 4 mg (4 mg Oral Given 07/25/19 1912)  metoCLOPramide (REGLAN) injection 10 mg (10 mg Intravenous Given 07/25/19 2055)  sodium chloride 0.9 % bolus 1,000 mL (0 mLs Intravenous Stopped 07/25/19 2348)  morphine 4 MG/ML injection 4 mg (4 mg Intravenous Given 07/25/19 2055)  iohexol (OMNIPAQUE) 300 MG/ML solution 100 mL (100 mLs Intravenous Contrast Given 07/25/19 2100)  sodium chloride (PF) 0.9 % injection (10 mLs  Given 07/25/19 2055)     Initial Impression / Assessment and Plan / ED Course  I have reviewed the triage vital signs and the nursing  notes.  Pertinent labs & imaging results that were available during my care of the patient were reviewed by me and considered in my medical decision making (see chart for details).  48 yo female presenting to the ED with fatigue, nausea and vomiting, headache, and abdominal pain.  Onset of symptoms within the past 24 hours.  She reports multiple episodes of vomiting while shopping today.  She also reports she received a flu vaccine this afternoon - however, her presentation and timeline of symptoms is inconsistent with adverse flu reaction.  She has focal RLQ ttp on exam at McBurney's point.  Given her GI symptoms and mild leukocytosis, this raises concern for acute appendicitis.  Unfortunately the patient has already had prior CT scans this week to evaluate her kidney stones, but after discussing my concerns at the bedside, we have decided to proceed with repeat CT scan.   We'll also check a UA for signs of infection. She was treated for UTI 5 days ago with keflex,which she has finished, but we need to ensure there is no infected kidney stone.  She does not appear septic or bacteremic on exam.  The abruptness of her other symptom onset (headache, myalgia) is suggestive of viral illness.   Her headache appears to be tension-type.  We will treat with reglan and IV fluids.  I have a lower suspicion for bacterial meningitis at this time.  Her neck is supple. She does not appear toxic.    Clinical Course as of Jul 25 1121  Fri Jul 25, 2019  2137 Leukocytes,Ua: NEGATIVE [MT]  2137 Nitrite: NEGATIVE [MT]  2137 Bacteria, UA: NONE SEEN [MT]  2138 IMPRESSION: 7 mm left proximal ureteral calculus causing mild pelvicaliectasis.  No other acute intra-abdominal or pelvic pathology to explain the patient's symptoms.     [MT]  2344 Patient is feeling much better after medications, will reswab for covid, discharge with pain medication and urology f/u   [MT]    Clinical Course User Index [MT]  Wyvonnia Dusky,  MD    Final Clinical Impressions(s) / ED Diagnoses   Final diagnoses:  Right lower quadrant abdominal pain  Kidney stone on left side  Nonintractable headache, unspecified chronicity pattern, unspecified headache type  Nausea and vomiting, intractability of vomiting not specified, unspecified vomiting type    ED Discharge Orders         Ordered    oxyCODONE (ROXICODONE) 5 MG immediate release tablet  Every 4 hours PRN     07/25/19 2354    ondansetron (ZOFRAN ODT) 4 MG disintegrating tablet  Every 8 hours PRN     07/25/19 2354           Wyvonnia Dusky, MD 07/26/19 1126

## 2019-07-25 NOTE — ED Notes (Signed)
Pt reminded a urine specimen is needed 

## 2019-07-25 NOTE — ED Notes (Signed)
Patient transported to CT 

## 2019-07-25 NOTE — ED Triage Notes (Signed)
Per EMS, Pt called for N/V and abd pain in the right upper quadrant. Began apprx 1500, pt also had a flu shot prior to S/S. Pt is afebrile.

## 2019-07-25 NOTE — ED Notes (Addendum)
Pt complaining of a headache, states her pain is at a 7. States she can not pee.

## 2019-07-26 LAB — SARS CORONAVIRUS 2 (TAT 6-24 HRS): SARS Coronavirus 2: NEGATIVE

## 2019-07-27 ENCOUNTER — Encounter (HOSPITAL_COMMUNITY): Payer: Self-pay | Admitting: Emergency Medicine

## 2019-07-29 DIAGNOSIS — N200 Calculus of kidney: Secondary | ICD-10-CM | POA: Insufficient documentation

## 2019-09-05 ENCOUNTER — Emergency Department (HOSPITAL_COMMUNITY)
Admission: EM | Admit: 2019-09-05 | Discharge: 2019-09-05 | Disposition: A | Discharge status: Home / Self Care | Payer: Self-pay | Attending: Emergency Medicine | Admitting: Emergency Medicine

## 2019-09-05 ENCOUNTER — Emergency Department (HOSPITAL_COMMUNITY): Payer: Self-pay

## 2019-09-05 ENCOUNTER — Encounter (HOSPITAL_COMMUNITY): Payer: Self-pay | Admitting: Emergency Medicine

## 2019-09-05 ENCOUNTER — Other Ambulatory Visit: Payer: Self-pay

## 2019-09-05 DIAGNOSIS — W19XXXA Unspecified fall, initial encounter: Secondary | ICD-10-CM

## 2019-09-05 DIAGNOSIS — M25511 Pain in right shoulder: Secondary | ICD-10-CM | POA: Insufficient documentation

## 2019-09-05 DIAGNOSIS — Y939 Activity, unspecified: Secondary | ICD-10-CM | POA: Insufficient documentation

## 2019-09-05 DIAGNOSIS — W1839XA Other fall on same level, initial encounter: Secondary | ICD-10-CM | POA: Insufficient documentation

## 2019-09-05 DIAGNOSIS — M25561 Pain in right knee: Secondary | ICD-10-CM | POA: Insufficient documentation

## 2019-09-05 DIAGNOSIS — Y92512 Supermarket, store or market as the place of occurrence of the external cause: Secondary | ICD-10-CM | POA: Insufficient documentation

## 2019-09-05 DIAGNOSIS — Y999 Unspecified external cause status: Secondary | ICD-10-CM | POA: Insufficient documentation

## 2019-09-05 DIAGNOSIS — I10 Essential (primary) hypertension: Secondary | ICD-10-CM | POA: Insufficient documentation

## 2019-09-05 DIAGNOSIS — M25551 Pain in right hip: Secondary | ICD-10-CM | POA: Insufficient documentation

## 2019-09-05 DIAGNOSIS — Z79899 Other long term (current) drug therapy: Secondary | ICD-10-CM | POA: Insufficient documentation

## 2019-09-05 DIAGNOSIS — M25571 Pain in right ankle and joints of right foot: Secondary | ICD-10-CM | POA: Insufficient documentation

## 2019-09-05 IMAGING — CR DG HIP (WITH OR WITHOUT PELVIS) 2-3V*R*
3 series · 3 of 3 positions shown · non-contrast
Comparison: [DATE]

CLINICAL DATA: RIGHT hip pain post fall

EXAM:
DG HIP (WITH OR WITHOUT PELVIS) 2-3V RIGHT

[t pelvis ap]
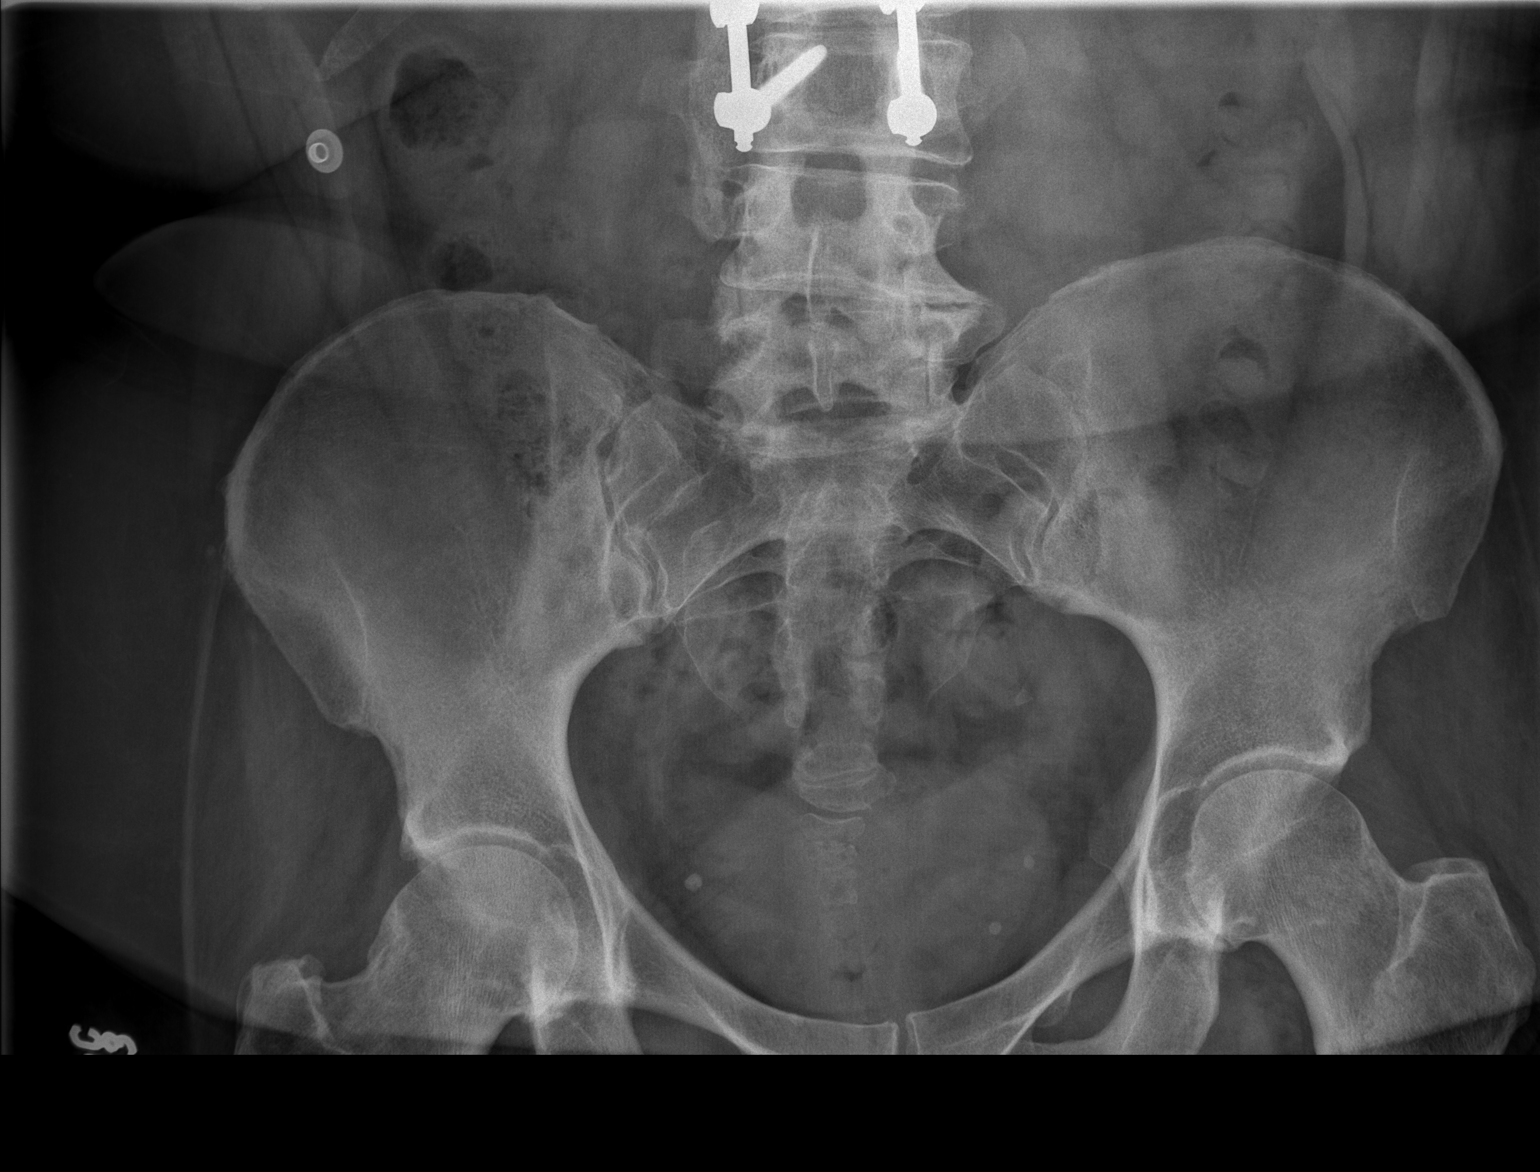

[t hip ap right]
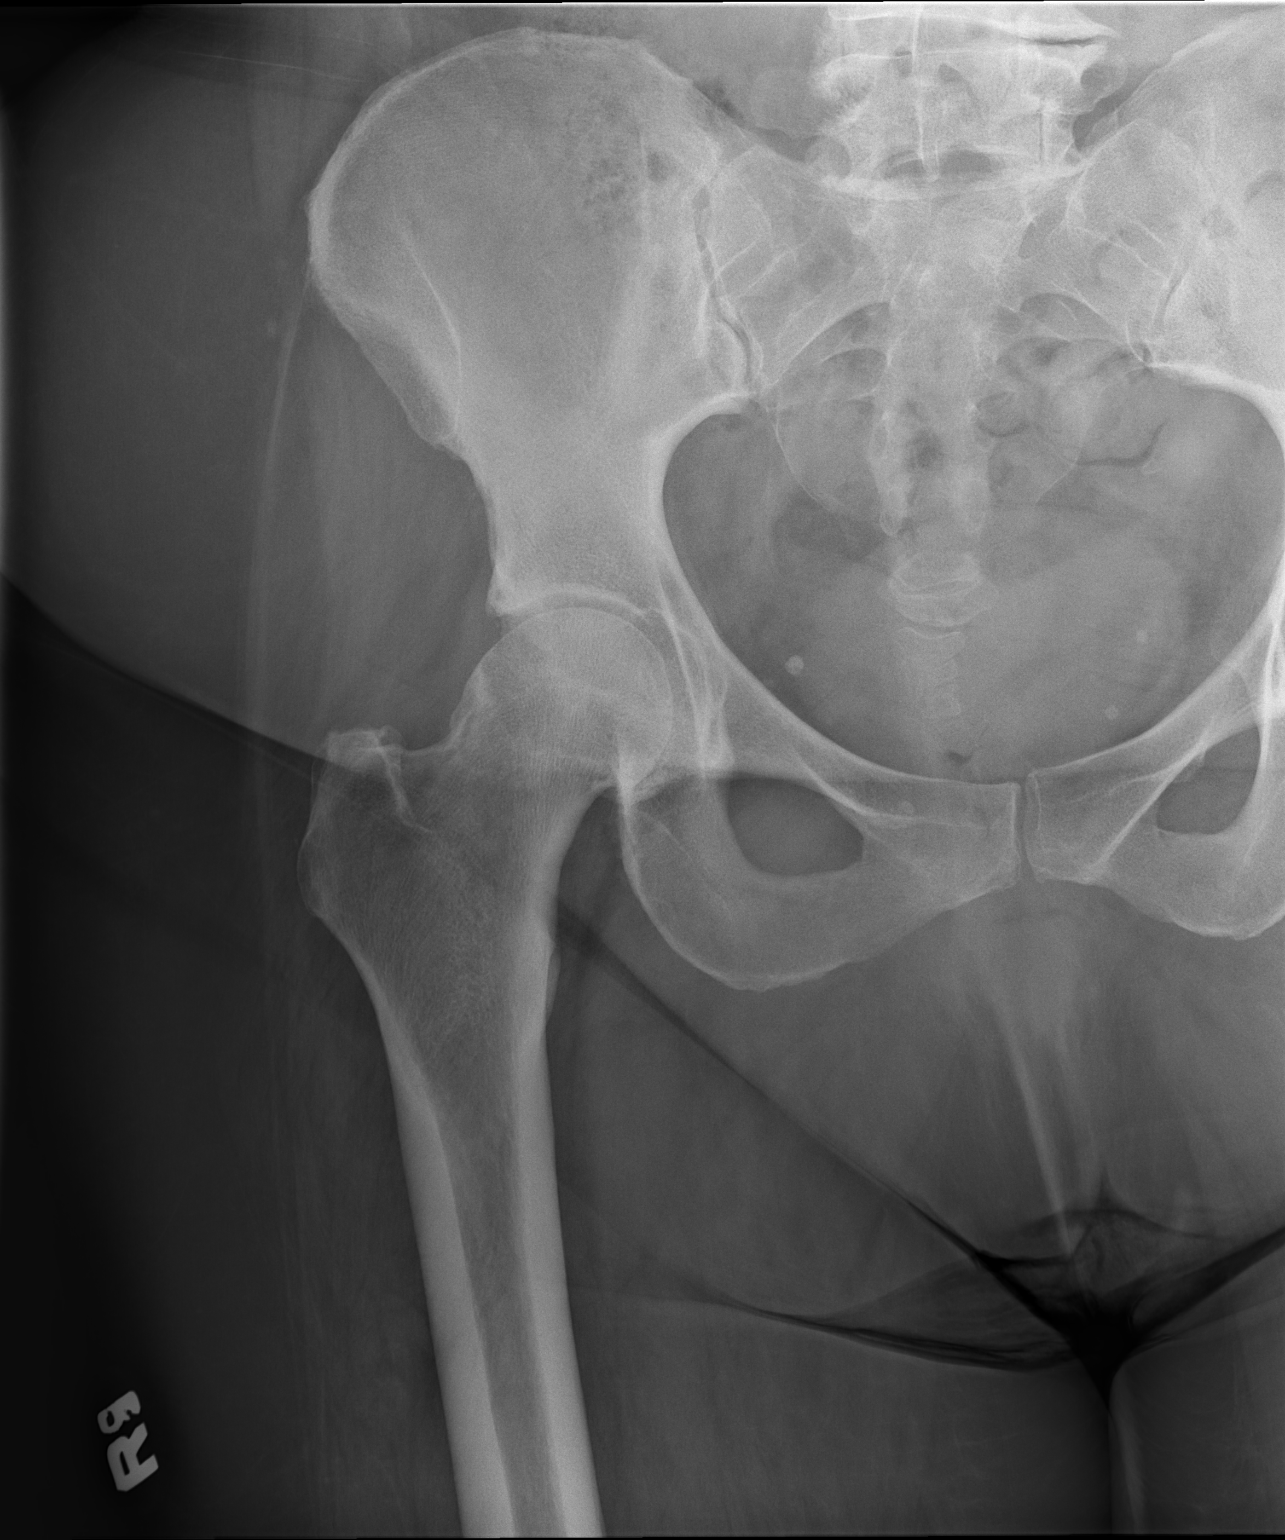

[t hip frog leg right]
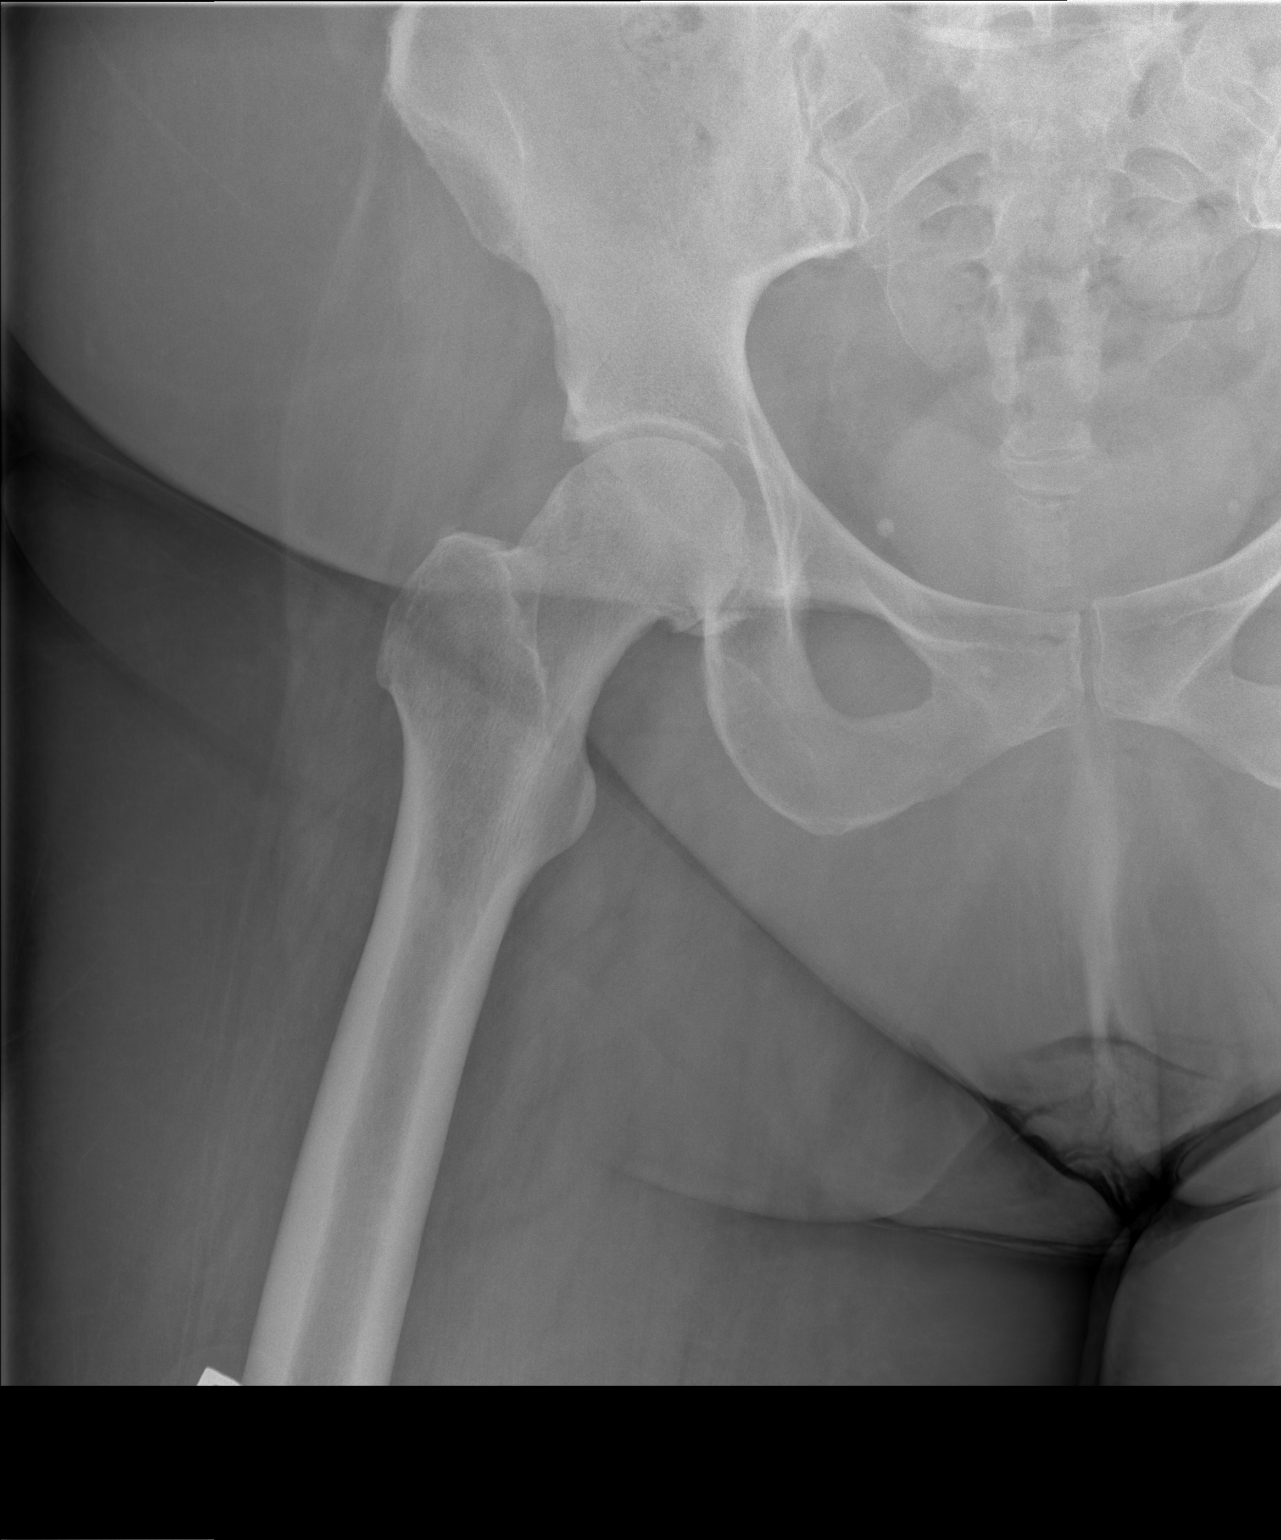

[3 of 3 positions shown; findings below may reference images not displayed]

FINDINGS: Osseous mineralization normal.

Mild degenerative changes of the RIGHT hip joint with joint space
narrowing and spurring.

Minimal narrowing of LEFT hip joint.

SI joints preserved.

No acute fracture, dislocation, or bone destruction.

Degenerative disc disease changes L4-L5.

Prior fusion L2-L3.
IMPRESSION: Degenerative changes of RIGHT hip and at L4-L5.

No acute abnormalities.

## 2019-09-05 IMAGING — CR DG SHOULDER 2+V*R*
2 series · 2 of 2 positions shown · non-contrast
Comparison: Chest radiograph dated [DATE].

CLINICAL DATA: 48-year-old female with right shoulder pain.

EXAM:
RIGHT SHOULDER - 2+ VIEW

[x shoulder ap right (1 of 2)]
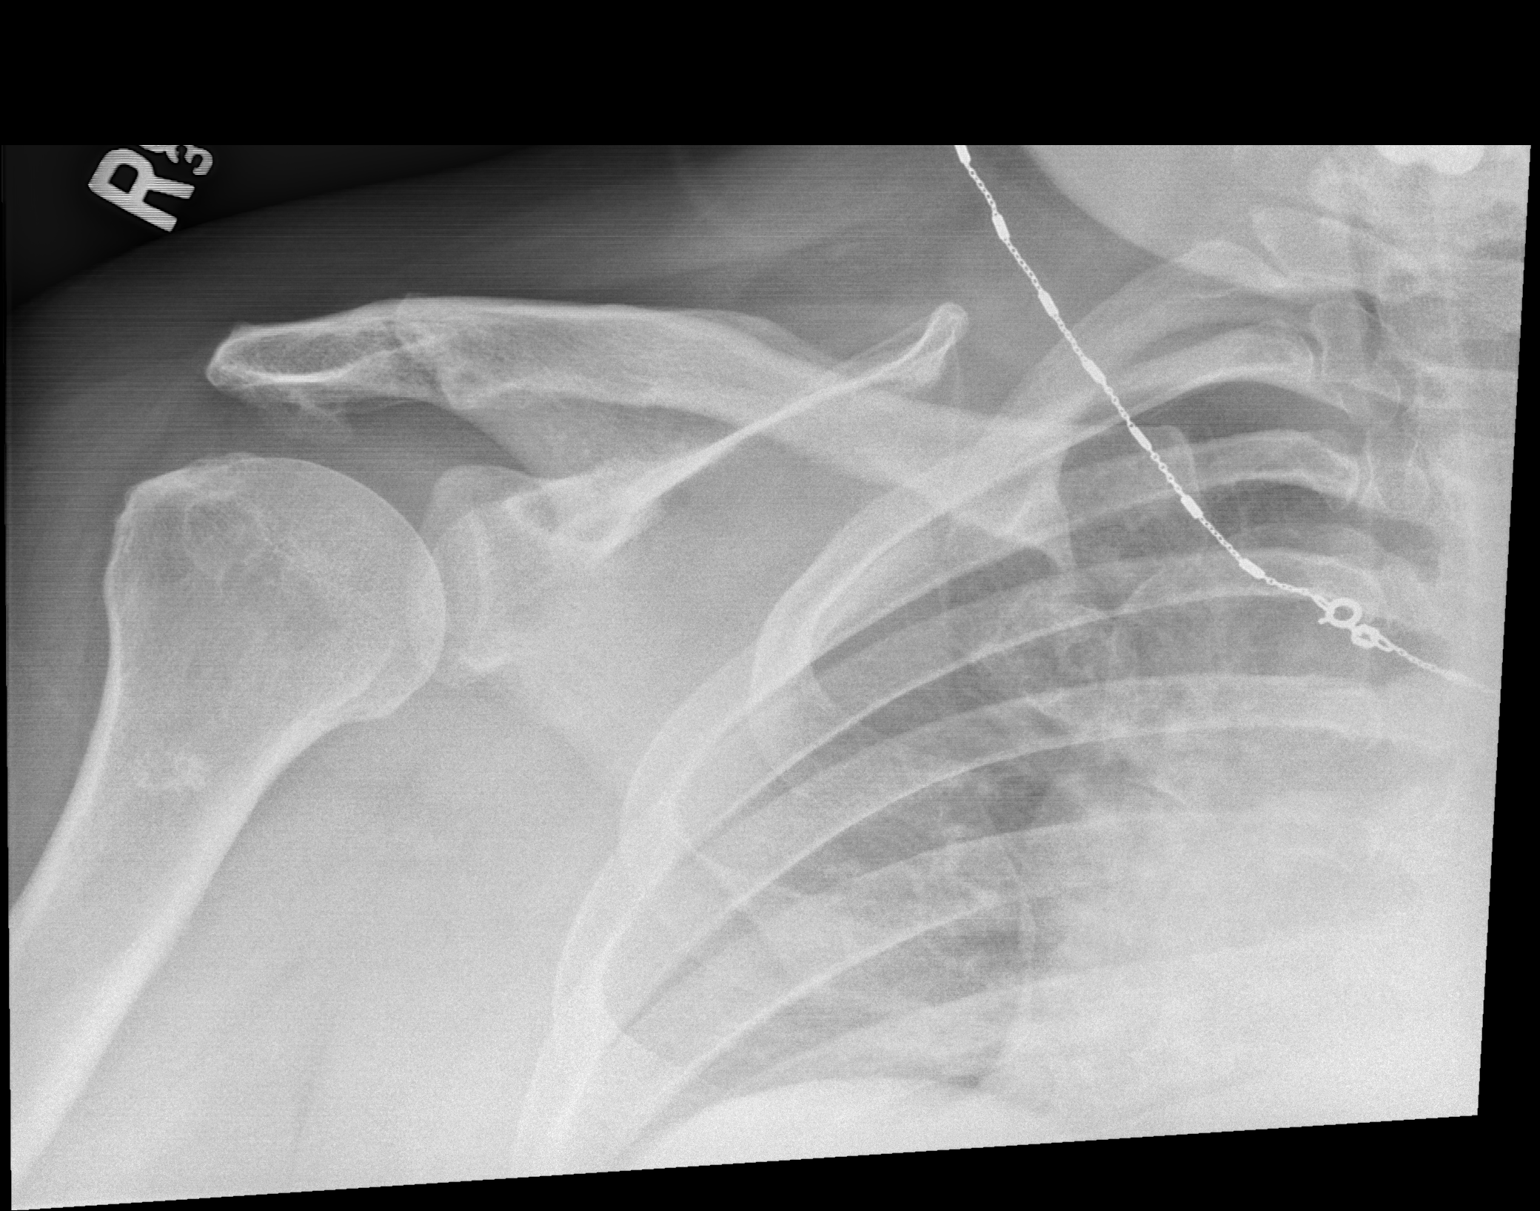

[x shoulder ap right (2 of 2)]
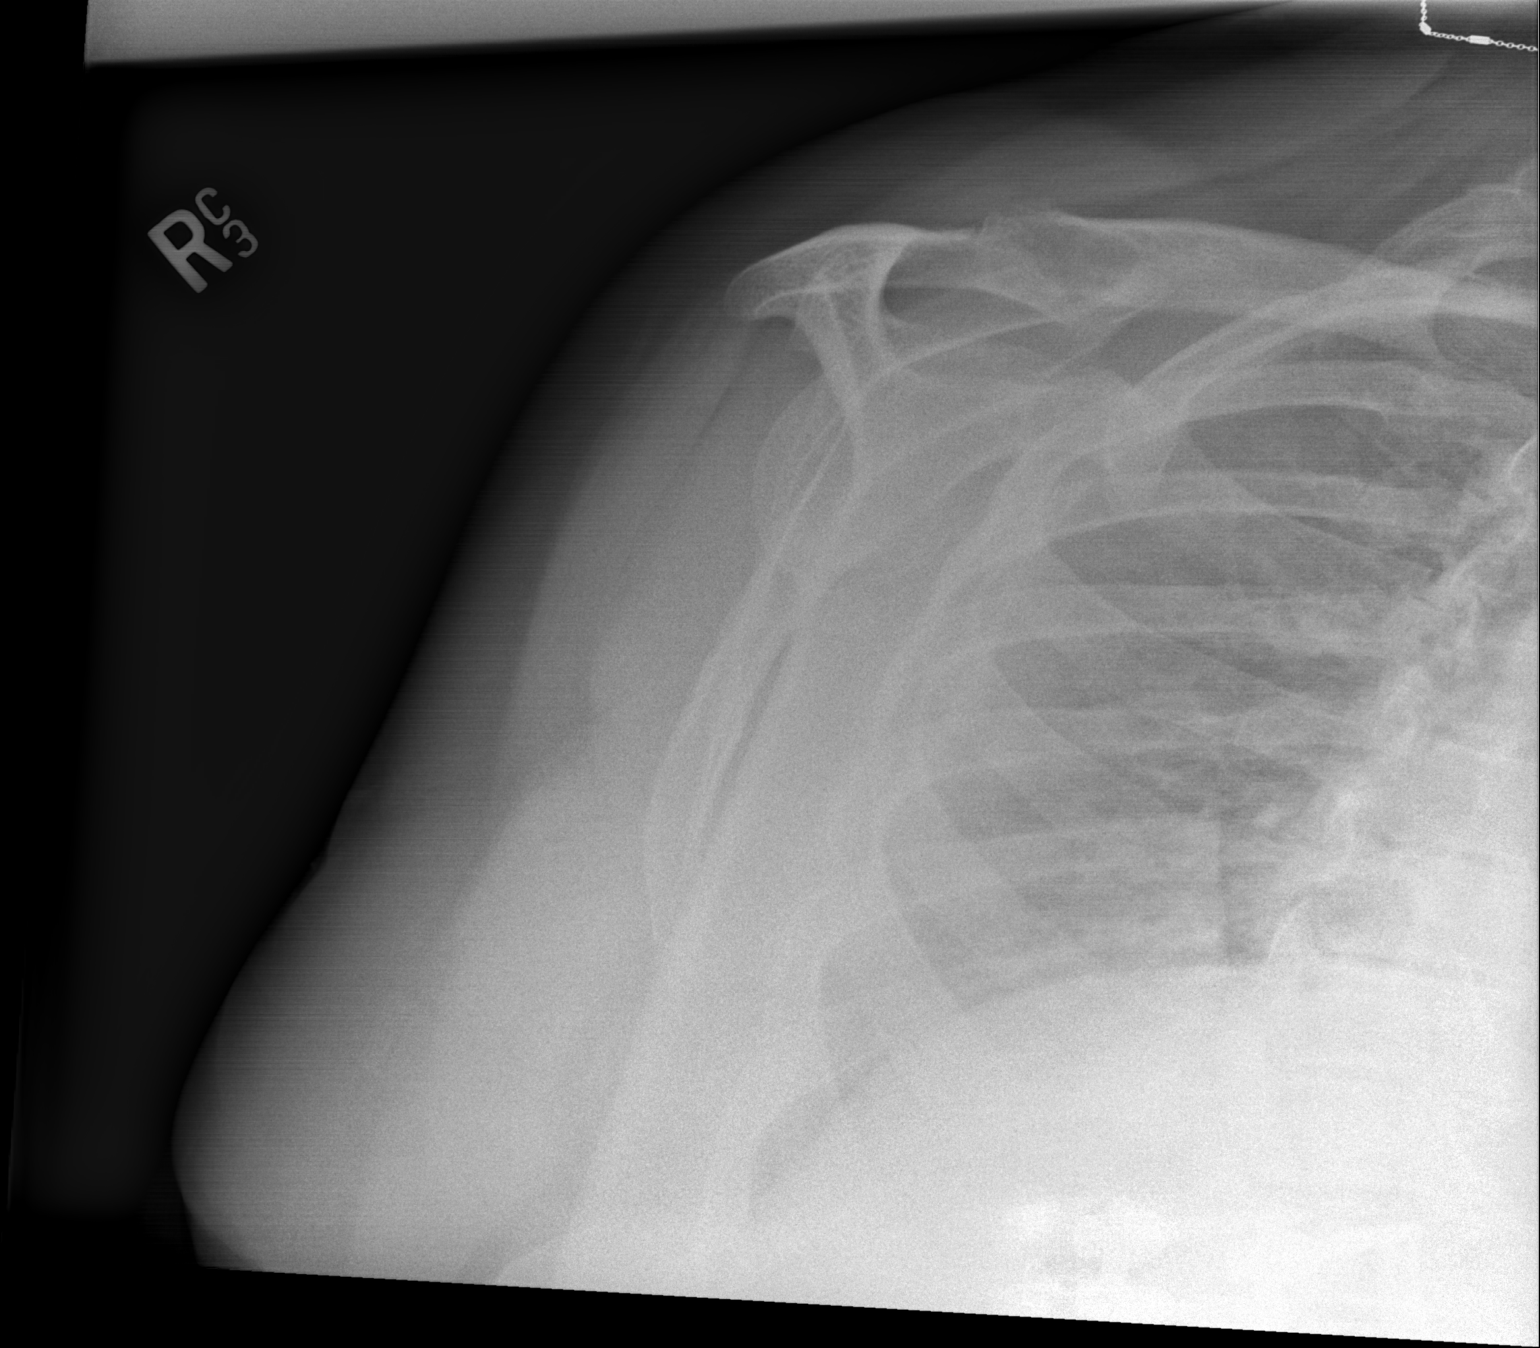

[2 of 2 positions shown; findings below may reference images not displayed]

FINDINGS: There is no acute fracture or dislocation. Mild arthritic changes of
the right shoulder with subcortical cystic changes of the lateral
humeral head and bone spurring from undersurface of the acromion. A
13 mm sclerotic focus in the proximal right humeral diaphysis is
suboptimally characterized on this radiograph, but does not
demonstrate aggressive features. Direct comparison with prior
images, if available, is recommended. The soft tissues are
unremarkable.
IMPRESSION: 1. No acute fracture or dislocation.
2. Mild arthritic changes of the right shoulder.

## 2019-09-05 IMAGING — CR DG ANKLE 2V *R*
2 series · 2 of 2 positions shown · non-contrast
Comparison: None

CLINICAL DATA: RIGHT ankle pain post fall

EXAM:
RIGHT ANKLE - 2 VIEW

[x ankle ap right]
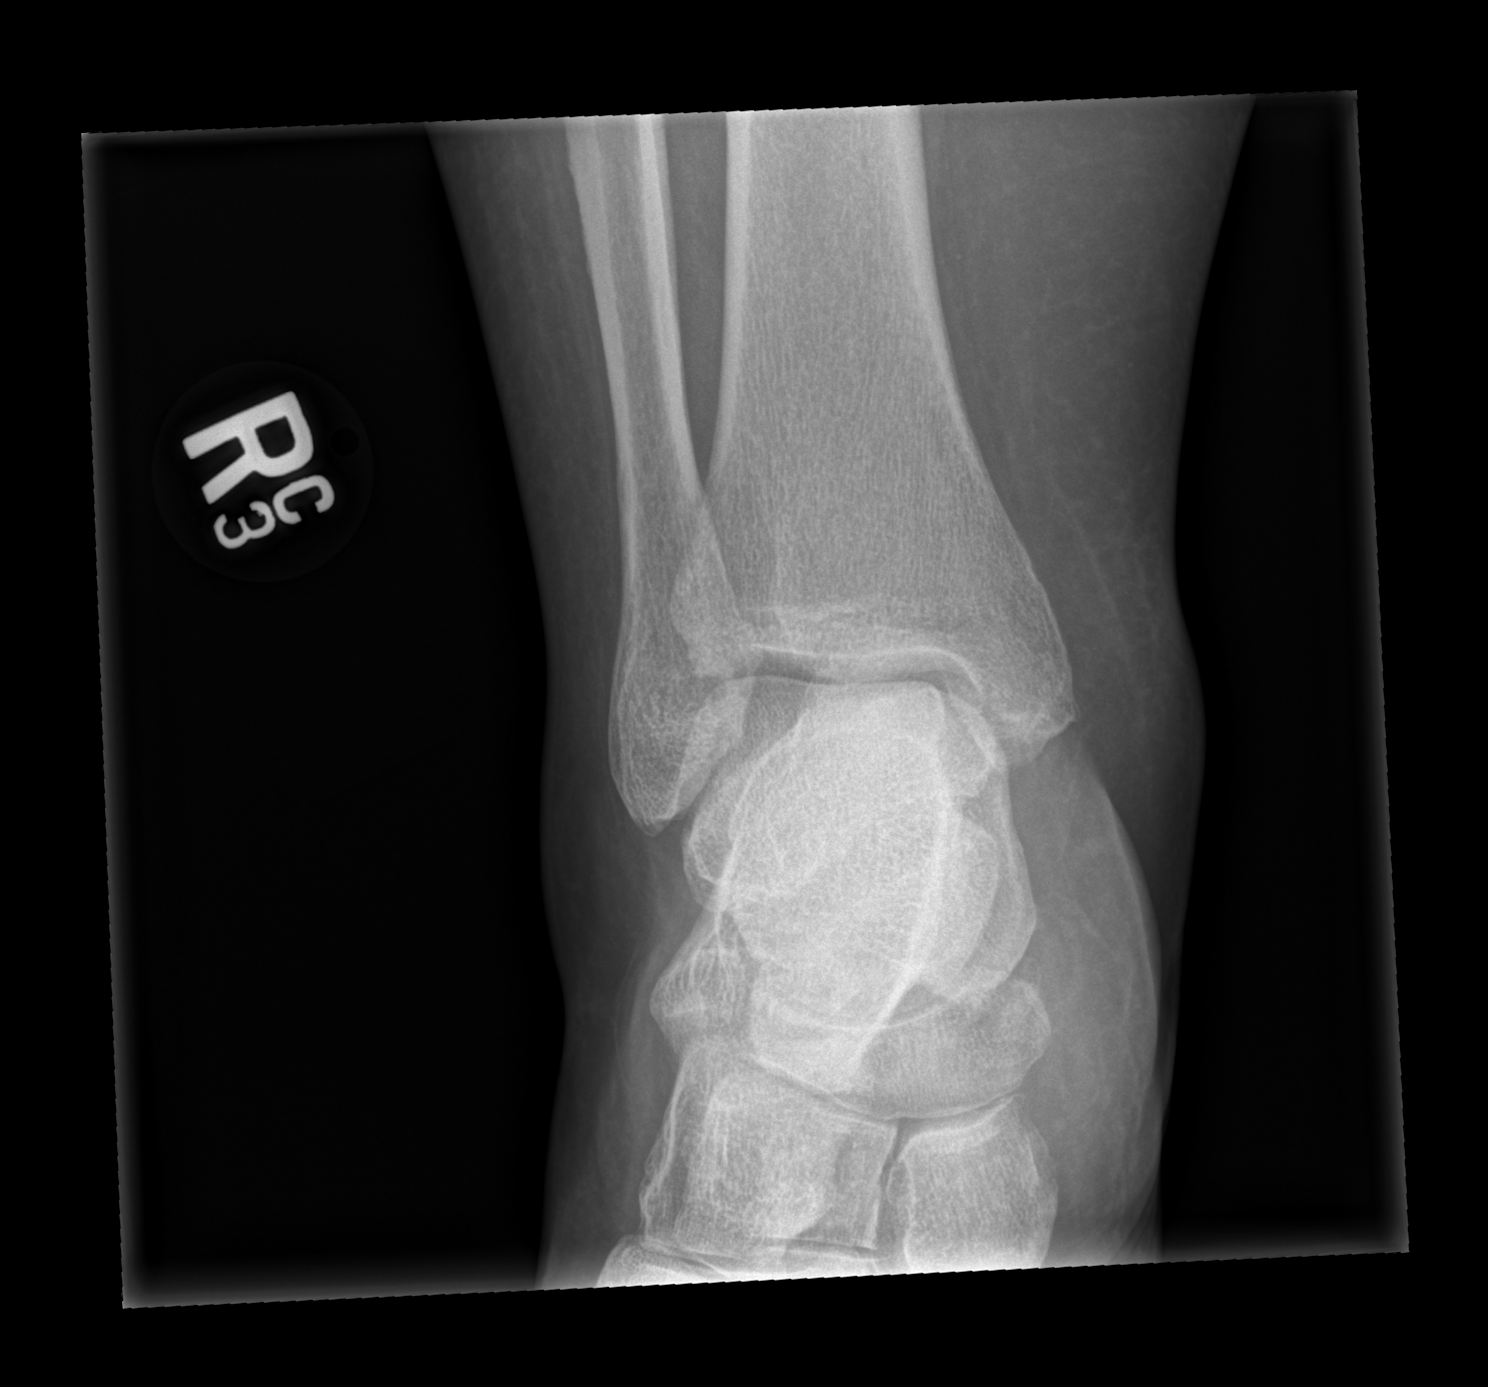

[x ankle lat right]
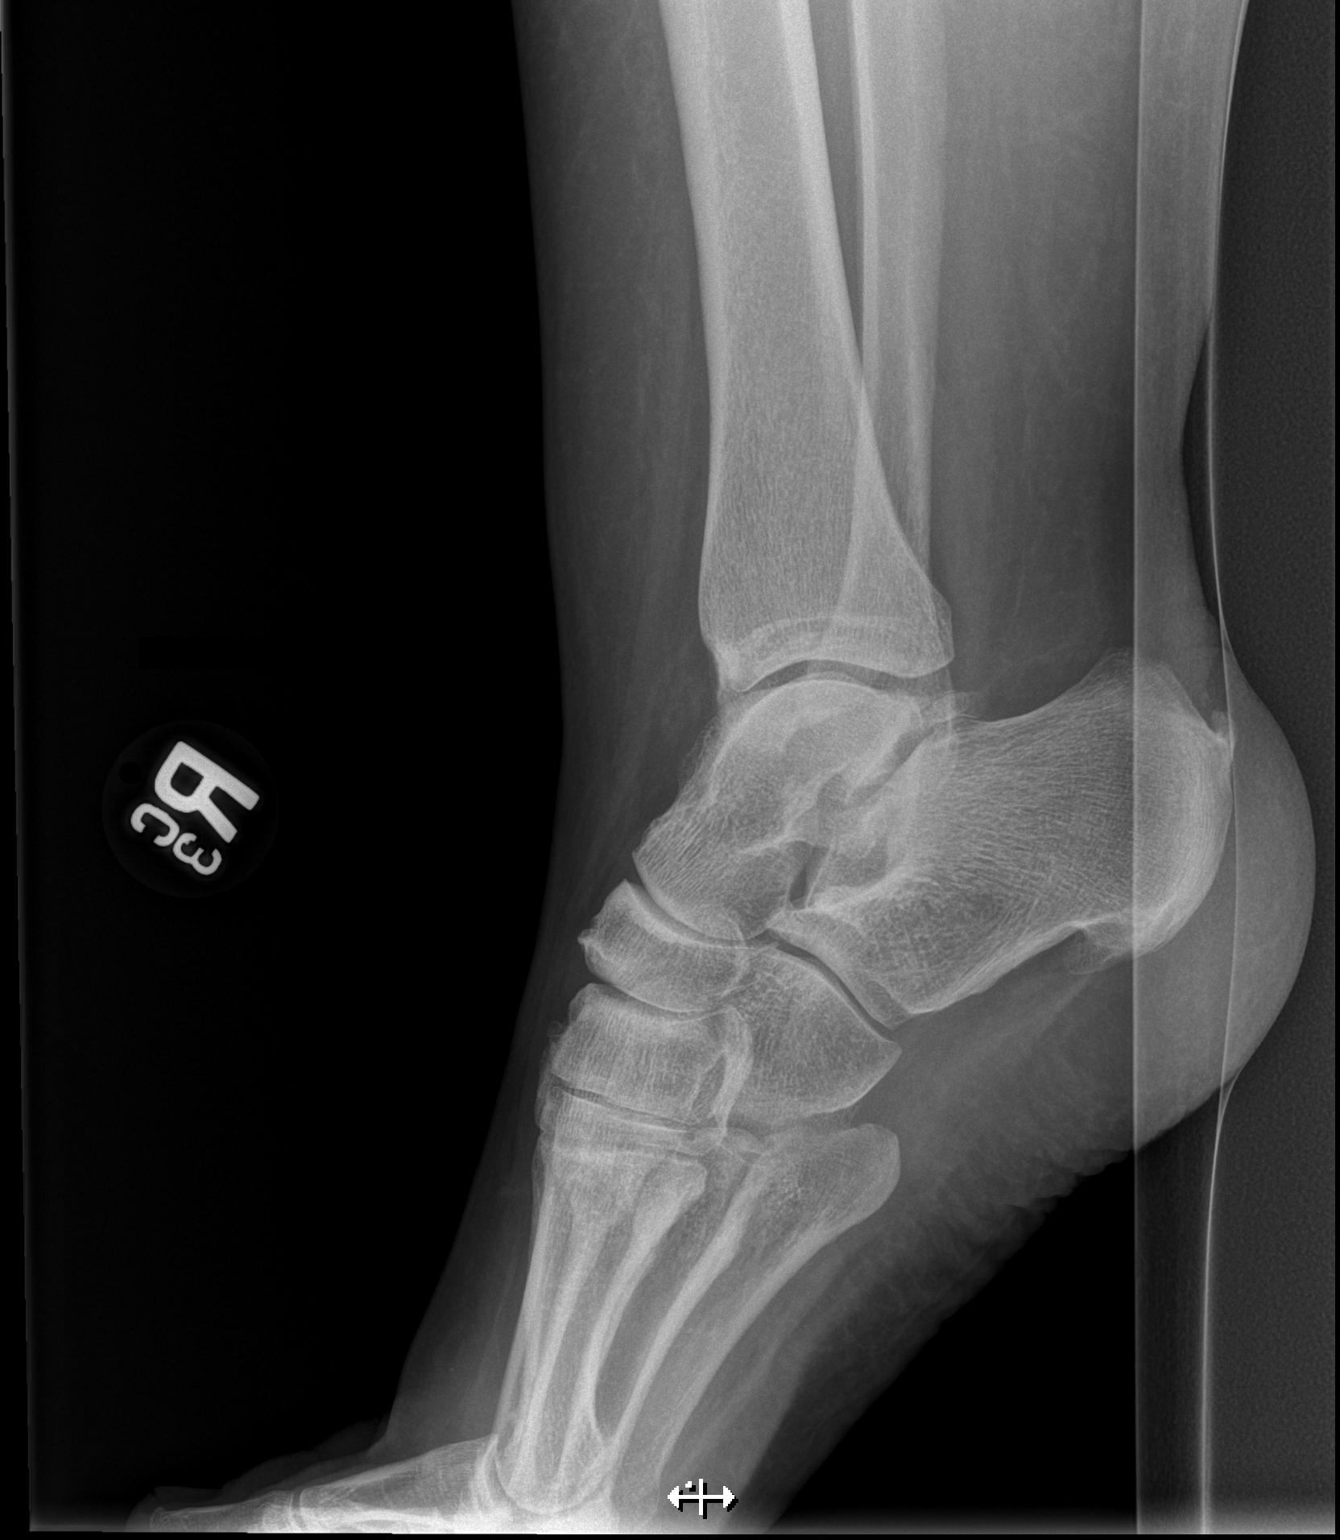

[2 of 2 positions shown; findings below may reference images not displayed]

FINDINGS: Osseous mineralization normal.

Joint spaces preserved.

Plantar and Achilles insertion calcaneal spurs.

Scattered soft tissue swelling.

No acute fracture, dislocation, or bone destruction.
IMPRESSION: No acute osseous abnormalities.

Calcaneal spurring.

## 2019-09-05 IMAGING — CR DG KNEE COMPLETE 4+V*R*
4 series · 4 of 4 positions shown · non-contrast
Comparison: None.

CLINICAL DATA: 48-year-old female with fall and right knee pain.

EXAM:
RIGHT KNEE - COMPLETE 4+ VIEW

[t knee obl right (1 of 2)]
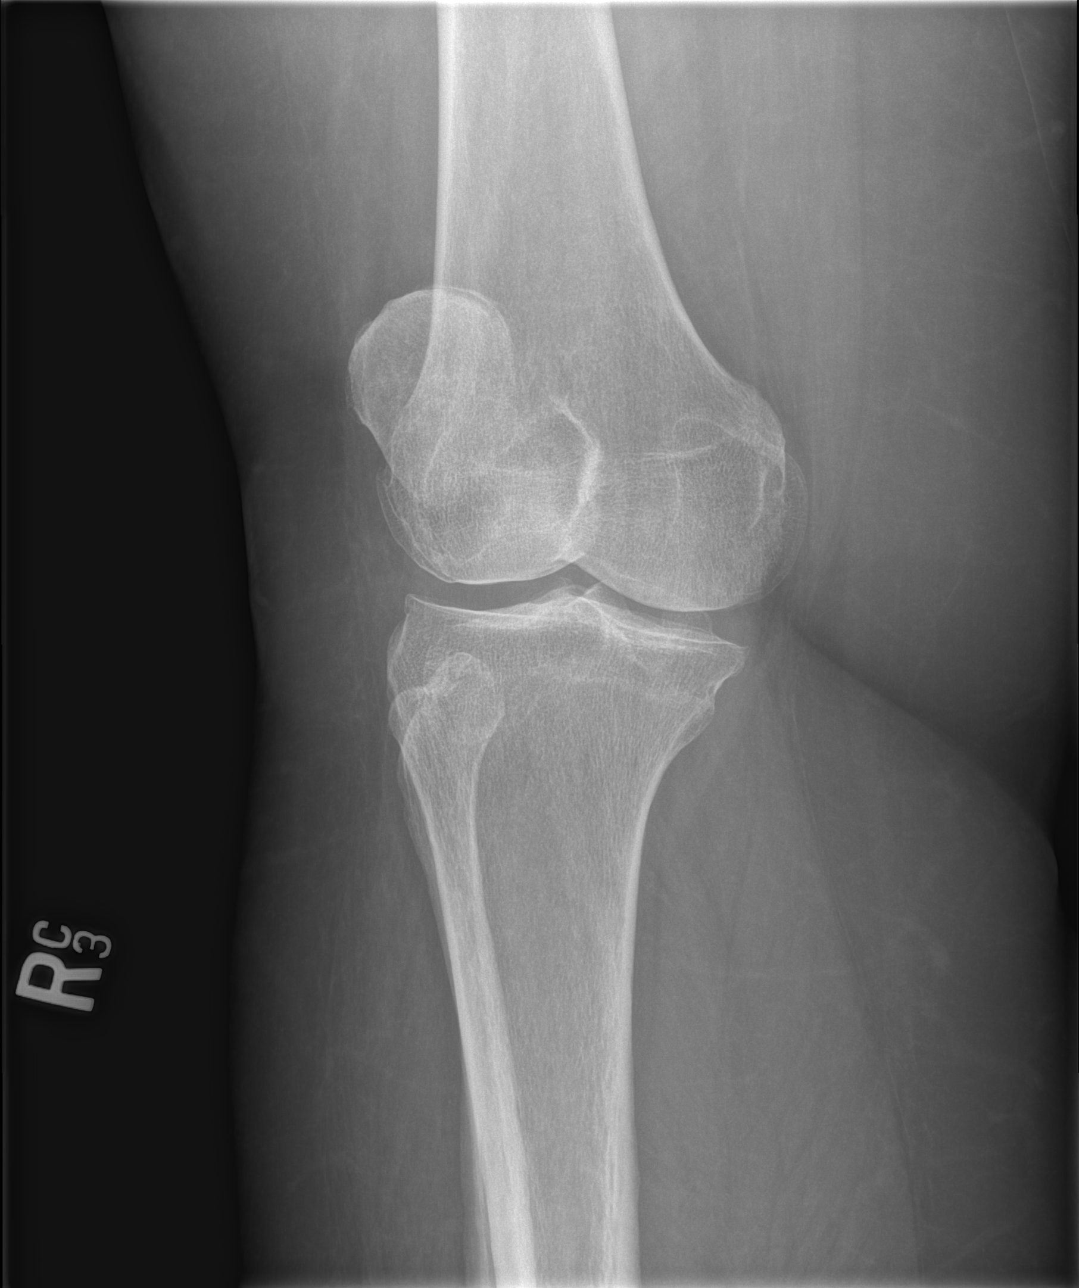

[t knee ap right]
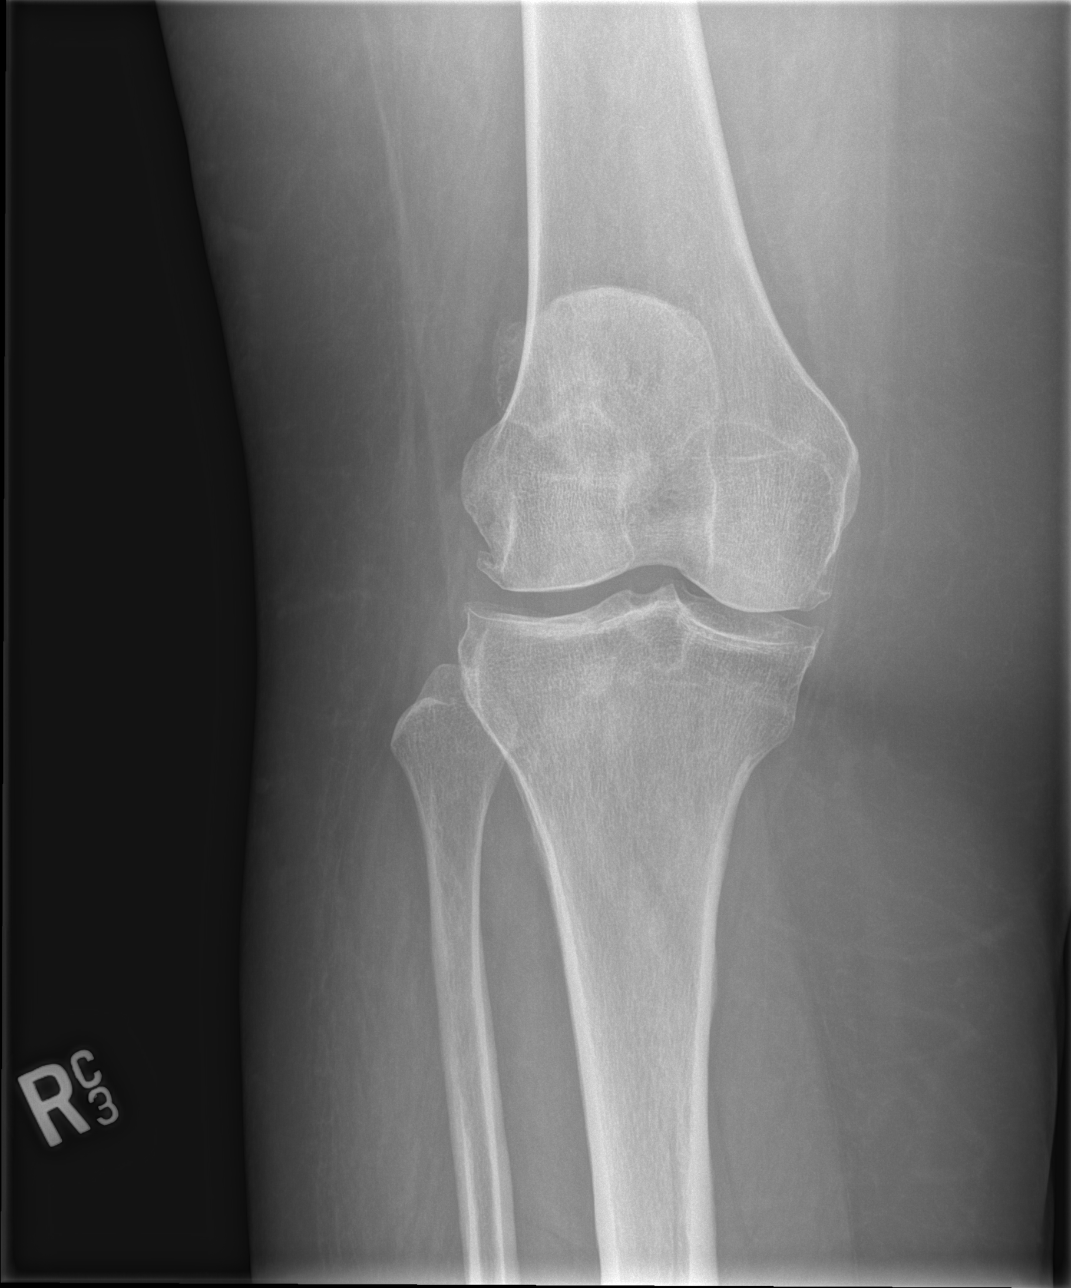

[t knee obl right (2 of 2)]
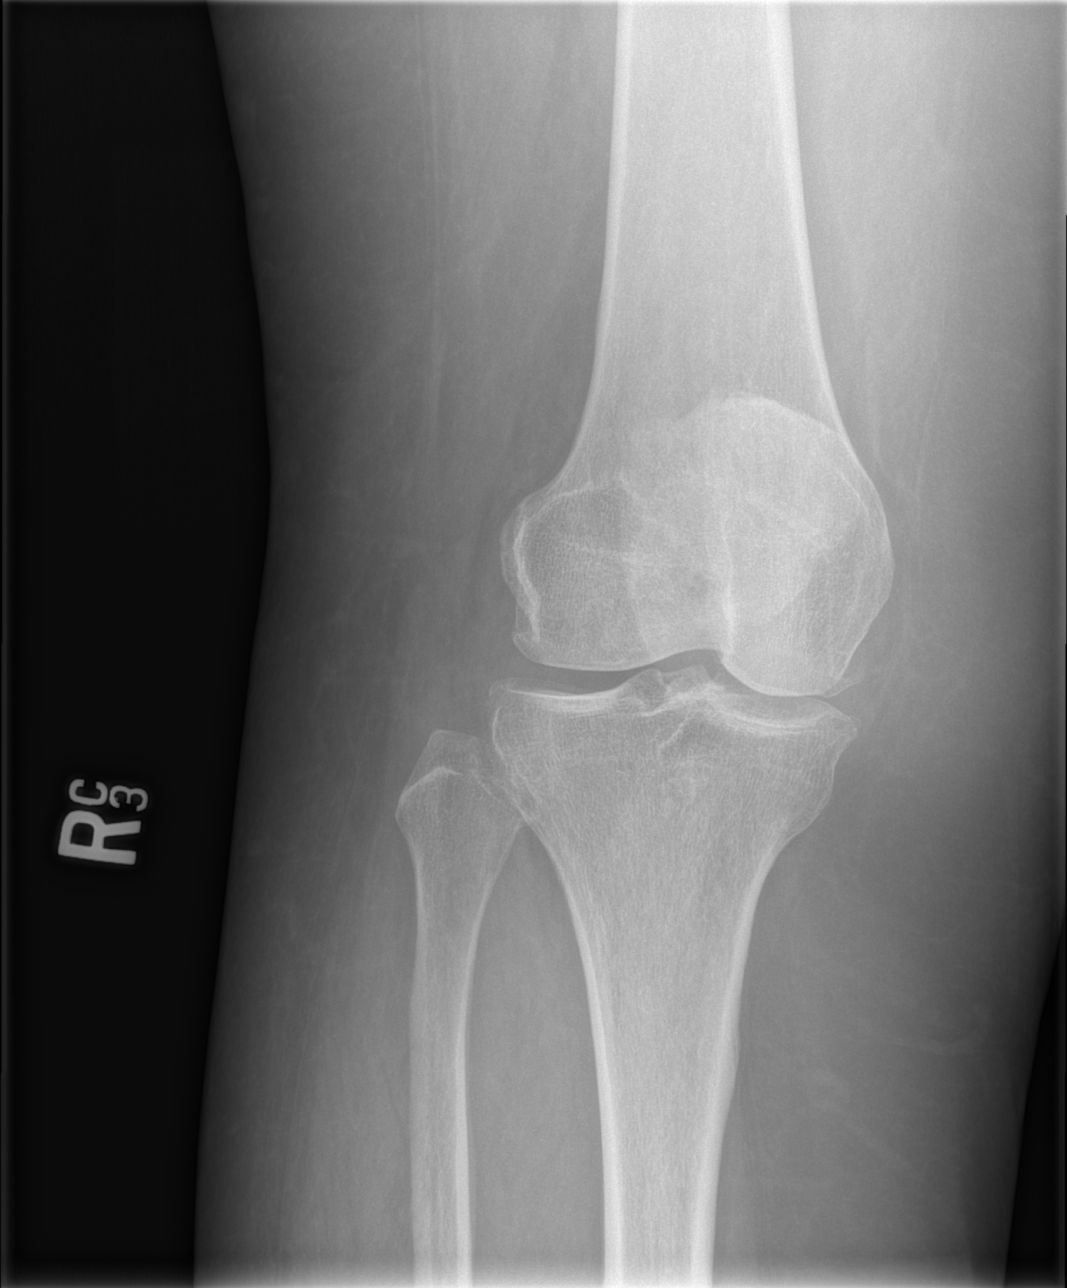

[x knee lat right]
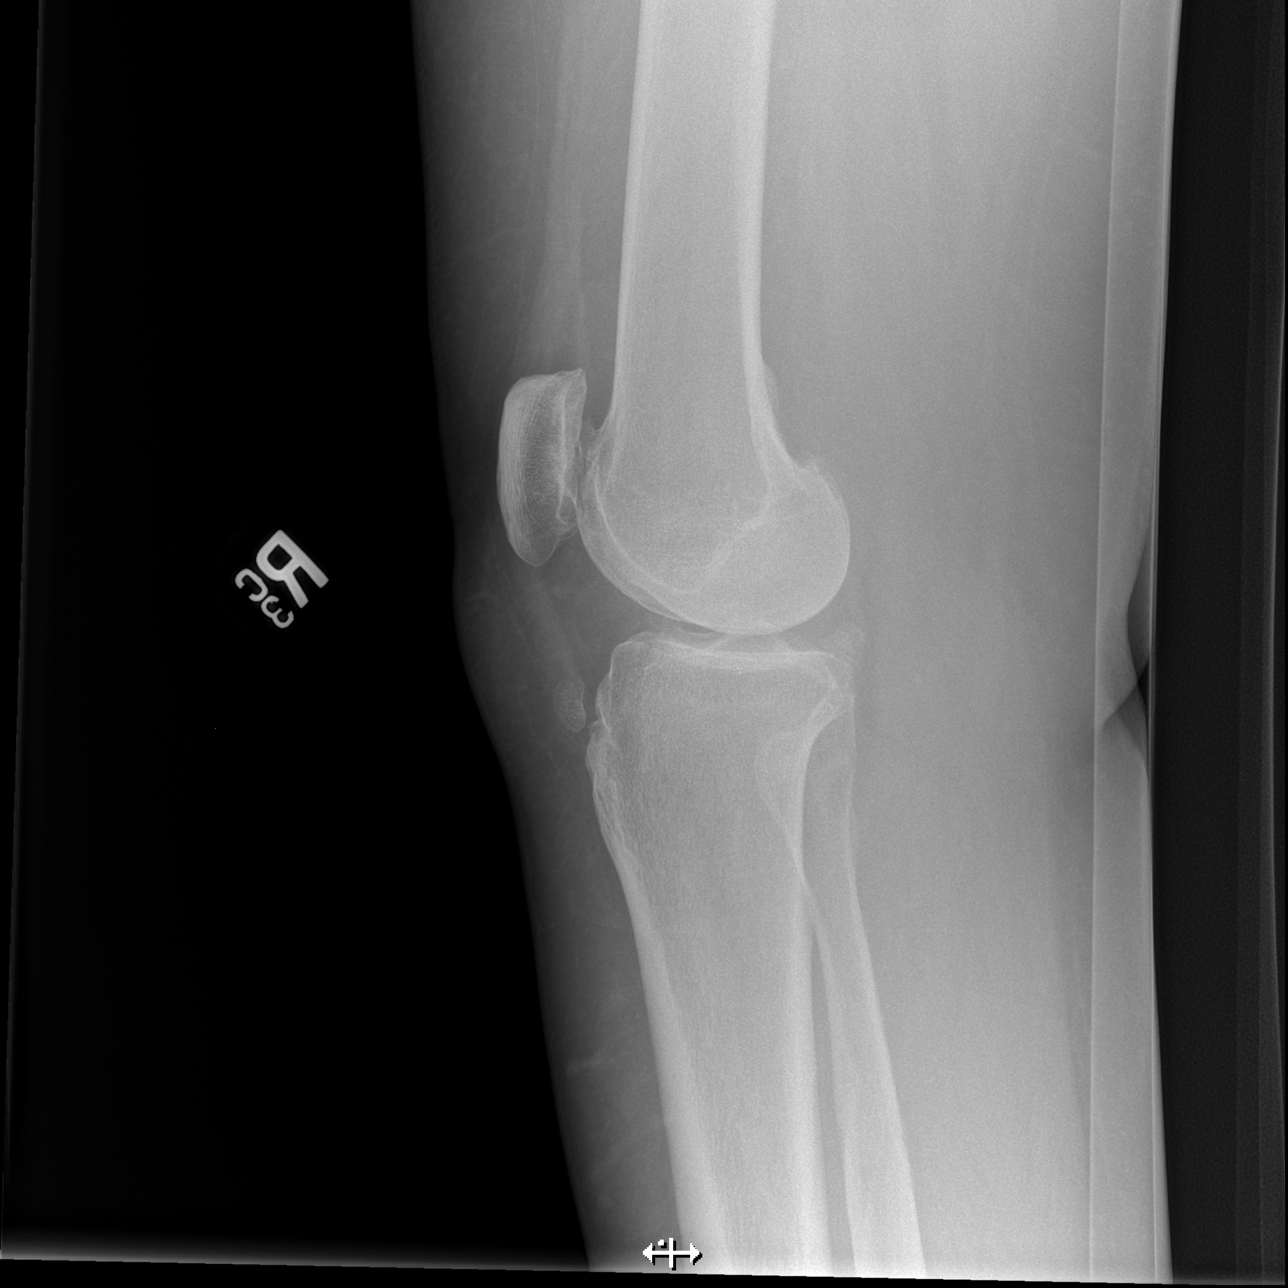

[4 of 4 positions shown; findings below may reference images not displayed]

FINDINGS: There is no acute fracture or dislocation. Mild osteopenia and mild
arthritic changes with narrowing of the patellofemoral and medial
compartment and bone spurring. No significant joint effusion. The
soft tissues are unremarkable.
IMPRESSION: 1. No acute fracture or dislocation.
2. Mild arthritic changes.

## 2019-09-05 MED ORDER — HYDROCODONE-ACETAMINOPHEN 5-325 MG PO TABS
1.0000 | ORAL_TABLET | Freq: Once | ORAL | Status: AC
  Administered 2019-09-05: 1 via ORAL
  Filled 2019-09-05: qty 1

## 2019-09-05 MED ORDER — HYDROCODONE-ACETAMINOPHEN 5-325 MG PO TABS: 2.0000 | ORAL_TABLET | ORAL | 0 refills | Status: DC | PRN

## 2019-09-05 NOTE — Discharge Instructions (Signed)
Please return for any problem.  Follow-up with your regular care provider as instructed. °

## 2019-09-05 NOTE — ED Provider Notes (Signed)
[facility] DEPT Provider Note   CSN: [id number] Arrival date & time: 09/05/19  1425     History   Chief Complaint Chief Complaint  Patient presents with   Knee Pain   Hip Pain   Shoulder Pain    HPI Mary Chambers is a 48 y.o. female.     48 year old female with prior medical history as detailed below presents for evaluation following fall.  Patient reports that she was shopping at a [employer]  She reports an accidental fall from standing.  She landed hard on her right knee.  She complains now of pain to the right shoulder, right hip, right knee, and right ankle.  She received fentanyl in route to the ED from EMS.  She feels moderately improved.  She denies head injury or loss consciousness.  She denies associated chest pain or shortness of breath.  The history is provided by the patient and medical records.  Knee Pain Location:  Hip, knee and ankle Injury: no   Pain details:    Quality:  Aching   Radiates to:  Does not radiate   Severity:  No pain   Onset quality:  Sudden   Timing:  Constant   Progression:  Waxing and waning Hip Pain  Shoulder Pain   Past Medical History:  Diagnosis Date   Hypertension    Kidney stone     Patient Active Problem List   Diagnosis Date Noted   Pain in left foot 03/29/2018   Arthritis 02/13/2018   Chronic back pain 02/13/2018   Migraines 02/13/2018   Morbid obesity with BMI of 40.0-44.9, adult ([place]) 02/13/2018   Synovial cyst of lumbar spine 10/15/2017   Osteoarthritis of knee 07/13/2017   Lumbar disc disease with radiculopathy 08/01/2016   Fatigue 05/04/2016   Headache 05/04/2016   Numbness of foot 05/04/2016   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   Arthrodesis status 02/02/2016   Chronic pain syndrome 02/02/2016   Elevated white blood cell count 12/01/2015   Lumbar stenosis with neurogenic claudication 05/07/2015   Bilateral leg edema 04/21/2015   Carpal tunnel  syndrome of right wrist 12/14/2014   Renal atrophy, left 01/23/2014   Achilles tendonitis 01/16/2014   Right lower quadrant pain 12/18/2013   Hypertension, benign 06/03/2010   Urinary incontinence 06/03/2010   Cervical disc disease 09/09/2009   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      Home Medications    Prior to Admission medications   Medication Sig Start Date End Date Taking? Authorizing Provider  acetaminophen (TYLENOL) 500 MG tablet Take 500 mg by mouth every 6 (six) hours as needed for moderate pain.    [provider]  cephALEXin (KEFLEX) 500 MG capsule Take 1 capsule (500 mg total) by mouth 2 (two) times daily. Patient not taking: Reported on 07/25/2019 07/20/19   Ok Edwards, PA-C  cetirizine (ZYRTEC) 10 MG chewable tablet Chew 1 tablet (10 mg total) by mouth daily. 05/06/19 06/05/19  Sharion Balloon, NP  enalapril (VASOTEC) 10 MG tablet Take 10 mg by mouth daily.    [provider]  hydrochlorothiazide (HYDRODIURIL) 25 MG tablet Take  25 mg by mouth daily.    [provider]  Multiple Vitamin (MULTIVITAMIN) capsule Take 1 capsule by mouth daily.     [provider]  ondansetron (ZOFRAN ODT) 4 MG disintegrating tablet Take 1 tablet (4 mg total) by mouth every 8 (eight) hours as needed for up to 15 doses for nausea or vomiting. 07/25/19   Wyvonnia Dusky, MD  oxyCODONE (ROXICODONE) 5 MG immediate release tablet Take 1 tablet (5 mg total) by mouth every 4 (four) hours as needed for up to 8 doses for severe pain or breakthrough pain. 07/25/19   Wyvonnia Dusky, MD  potassium chloride SA (K-DUR,KLOR-CON) 20 MEQ tablet Take 20 mEq by mouth 2 (two) times daily.    [provider]    tiZANidine (ZANAFLEX) 4 MG tablet Take 1 tablet (4 mg total) by mouth every 6 (six) hours as needed for muscle spasms. 07/20/19   Tasia Catchings, Amy V, PA-C  diphenhydrAMINE (BENADRYL) 25 mg capsule Take by mouth. 03/10/16 06/21/19  [provider]  fluticasone (FLONASE) 50 MCG/ACT nasal spray Place 1 spray into both nostrils daily. 05/06/19 07/20/19  Sharion Balloon, NP  LYRICA 50 MG capsule Take 50 mg by mouth 3 (three) times daily. 04/15/18 06/07/19  [provider]    Family History Family History  Problem Relation Age of Onset   Diabetes Mother    Hypertension Mother    Cancer Father    Parkinson's disease Father     Social History Social History   Tobacco Use   Smoking status: Never Smoker   Smokeless tobacco: Never Used  Substance Use Topics   Alcohol use: No   Drug use: Never     Allergies   Baclofen, Doxycycline, Percocet [oxycodone-acetaminophen], Septra [sulfamethoxazole-trimethoprim], and Gabapentin   Review of Systems Review of Systems  All other systems reviewed and are negative.    Physical Exam Updated Vital Signs BP (!) 150/97    Pulse 83    Temp 98.3 F (36.8 C) (Oral)    Resp 18    Ht 5\' 7"  (1.702 m)    Wt 120.2 kg    SpO2 97%    BMI 41.50 kg/m   Physical Exam Vitals signs and nursing note reviewed.  Constitutional:      General: She is not in acute distress.    Appearance: Normal appearance. She is well-developed.  HENT:     Head: Normocephalic and atraumatic.  Eyes:     Conjunctiva/sclera: Conjunctivae normal.     Pupils: Pupils are equal, round, and reactive to light.  Neck:     Musculoskeletal: Normal range of motion and neck supple.  Cardiovascular:     Rate and Rhythm: Normal rate and regular rhythm.     Heart sounds: Normal heart sounds.  Pulmonary:     Effort: Pulmonary effort is normal. No respiratory distress.     Breath sounds: Normal breath sounds.  Abdominal:     General: There is no distension.     Palpations:  Abdomen is soft.     Tenderness: There is no abdominal tenderness.  Musculoskeletal: Normal range of motion.        General: Tenderness present. No deformity.     Comments: Letter tenderness to the anterior superior aspect of the right shoulder.  Full active range of motion of the right shoulder noted.  Mild tenderness to the lateral aspect of the right hip.  No deformity noted.  Mild tenderness to the anterior aspect of the right  knee.  Full active range of motion noted.  No discrete deformity noted.  No ecchymosis noted.  Mild tenderness to the medial aspect of the right ankle.  No discrete deformity noted.  No ecchymosis noted.  Distal right lower extremity is neurovascular intact.   Skin:    General: Skin is warm and dry.  Neurological:     Mental Status: She is alert and oriented to person, place, and time.      ED Treatments / Results  Labs (all labs ordered are listed, but only abnormal results are displayed) Labs Reviewed - No data to display  EKG None  Radiology Dg Shoulder Right  Result Date: 09/05/2019 CLINICAL DATA:  48 year old female with right shoulder pain. EXAM: RIGHT SHOULDER - 2+ VIEW COMPARISON:  Chest radiograph dated 09/29/2015. FINDINGS: There is no acute fracture or dislocation. Mild arthritic changes of the right shoulder with subcortical cystic changes of the lateral humeral head and bone spurring from undersurface of the acromion. A 13 mm sclerotic focus in the proximal right humeral diaphysis is suboptimally characterized on this radiograph, but does not demonstrate aggressive features. Direct comparison with prior images, if available, is recommended. The soft tissues are unremarkable. IMPRESSION: 1. No acute fracture or dislocation. 2. Mild arthritic changes of the right shoulder. Electronically Signed   By: Anner Crete M.D.   On: 09/05/2019 16:22   Dg Ankle 2 Views Right  Result Date: 09/05/2019 CLINICAL DATA:  RIGHT ankle pain post fall EXAM:  RIGHT ANKLE - 2 VIEW COMPARISON:  None FINDINGS: Osseous mineralization normal. Joint spaces preserved. Plantar and Achilles insertion calcaneal spurs. Scattered soft tissue swelling. No acute fracture, dislocation, or bone destruction. IMPRESSION: No acute osseous abnormalities. Calcaneal spurring. Electronically Signed   By: Lavonia Dana M.D.   On: 09/05/2019 16:26   Dg Knee Complete 4 Views Right  Result Date: 09/05/2019 CLINICAL DATA:  48 year old female with fall and right knee pain. EXAM: RIGHT KNEE - COMPLETE 4+ VIEW COMPARISON:  None. FINDINGS: There is no acute fracture or dislocation. Mild osteopenia and mild arthritic changes with narrowing of the patellofemoral and medial compartment and bone spurring. No significant joint effusion. The soft tissues are unremarkable. IMPRESSION: 1. No acute fracture or dislocation. 2. Mild arthritic changes. Electronically Signed   By: Anner Crete M.D.   On: 09/05/2019 16:24   Dg Hip Unilat  With Pelvis 2-3 Views Right  Result Date: 09/05/2019 CLINICAL DATA:  RIGHT hip pain post fall EXAM: DG HIP (WITH OR WITHOUT PELVIS) 2-3V RIGHT COMPARISON:  07/20/2014 FINDINGS: Osseous mineralization normal. Mild degenerative changes of the RIGHT hip joint with joint space narrowing and spurring. Minimal narrowing of LEFT hip joint. SI joints preserved. No acute fracture, dislocation, or bone destruction. Degenerative disc disease changes L4-L5. Prior fusion L2-L3. IMPRESSION: Degenerative changes of RIGHT hip and at L4-L5. No acute abnormalities. Electronically Signed   By: Lavonia Dana M.D.   On: 09/05/2019 16:24    Procedures Procedures (including critical care time)  Medications Ordered in ED Medications  HYDROcodone-acetaminophen (NORCO/VICODIN) 5-325 MG per tablet 1 tablet (1 tablet Oral Given 09/05/19 1539)     Initial Impression / Assessment and Plan / ED Course  I have reviewed the triage vital signs and the nursing notes.  Pertinent labs & imaging  results that were available during my care of the patient were reviewed by me and considered in my medical decision making (see chart for details).  Clinical Course as of Sep 05 1703  Fri  Sep 05, 2019  1636 DG Knee Complete 4 Views Right [PM]  [id number] DG Shoulder Right [PM]    Clinical Course User Index [PM] Valarie Merino, MD       MDM  Screen complete  EMBREE HAHNER was evaluated in Emergency Department on 09/05/2019 for the symptoms described in the history of present illness. She was evaluated in the context of the global COVID-19 pandemic, which necessitated consideration that the patient might be at risk for infection with the SARS-CoV-2 virus that causes COVID-19. Institutional protocols and algorithms that pertain to the evaluation of patients at risk for COVID-19 are in a state of rapid change based on information released by regulatory bodies including the CDC and federal and state organizations. These policies and algorithms were followed during the patient's care in the ED.  Patient is presenting for evaluation following reported fall.  Patient without evidence of significant traumatic injury on exam.  Screening films obtained are without evidence of acute fracture or other significant pathology.  She feels improved following her ED evaluation.  She now desires discharge.   She does understand the need for close follow-up.  Strict return precautions given and understood.  Final Clinical Impressions(s) / ED Diagnoses   Final diagnoses:  Fall, initial encounter    ED Discharge Orders         Ordered    HYDROcodone-acetaminophen (NORCO/VICODIN) 5-325 MG tablet  Every 4 hours PRN     09/05/19 1703           Valarie Merino, MD 09/05/19 1705

## 2019-09-05 NOTE — ED Triage Notes (Signed)
While at a [employer], the patient fell on her right knee. She now complains of right knee pain, right hip pain and right shoulder soreness. The right knee has some swelling. The right hip is tender to pain. EMS administered 100 mcg of Fentanyl.    EMS vitals: 130/90 BP 84 HR 97% O2 sat on room air 18 Resp Rate

## 2019-09-08 ENCOUNTER — Other Ambulatory Visit: Payer: Self-pay

## 2019-09-08 ENCOUNTER — Ambulatory Visit
Admission: EM | Admit: 2019-09-08 | Discharge: 2019-09-08 | Disposition: A | Discharge status: Home / Self Care | Payer: Self-pay | Attending: Physician Assistant | Admitting: Physician Assistant

## 2019-09-08 DIAGNOSIS — R0981 Nasal congestion: Secondary | ICD-10-CM

## 2019-09-08 DIAGNOSIS — J3489 Other specified disorders of nose and nasal sinuses: Secondary | ICD-10-CM

## 2019-09-08 MED ORDER — DEXAMETHASONE SODIUM PHOSPHATE 10 MG/ML IJ SOLN
10.0000 mg | Freq: Once | INTRAMUSCULAR | Status: AC
  Administered 2019-09-08: 10 mg via INTRAMUSCULAR

## 2019-09-08 MED ORDER — PREDNISONE 50 MG PO TABS: 50.0000 mg | ORAL_TABLET | Freq: Every day | ORAL | 0 refills | Status: DC

## 2019-09-08 MED ORDER — KETOROLAC TROMETHAMINE 30 MG/ML IJ SOLN
30.0000 mg | Freq: Once | INTRAMUSCULAR | Status: AC
  Administered 2019-09-08: 19:00:00 30 mg via INTRAMUSCULAR

## 2019-09-08 MED ORDER — FLUTICASONE PROPIONATE 50 MCG/ACT NA SUSP: 2.0000 | Freq: Every day | NASAL | 0 refills | Status: DC

## 2019-09-08 NOTE — ED Triage Notes (Signed)
Pt c/o sinus headache and pressure for the past 3 days, some relief after blowing her nose.

## 2019-09-08 NOTE — Discharge Instructions (Addendum)
Toradol and decadron injection in office today. Start prednisone and flonase as directed. TYou can use over the counter nasal saline rinse such as neti pot for nasal congestion. Keep hydrated, your urine should be clear to pale yellow in color. Tylenol/motrin for fever and pain. Monitor for any worsening of symptoms, chest pain, shortness of breath, wheezing, swelling of the throat, go to the emergency department for further evaluation needed.

## 2019-09-08 NOTE — ED Provider Notes (Signed)
[facility]    CSN: [id number] Arrival date & time: 09/08/19  1843      History   Chief Complaint Chief Complaint  Patient presents with  . Facial Pain    HPI Mary Chambers is a 48 y.o. female.   48 year old female comes in for 1 week history of URI symptoms. Has had nasal congestion, sinus pressure/headache. For the past 3 days, has had worsening headache and therefore came in for evaluation. Denies cough, sore throat. Denies fever, chills, body aches. Denies abdominal pain, nausea, vomiting, diarrhea. Denies shortness of breath, loss of taste/smell. Never smoker. No obvious sick/COVID. Negative COVID test 5 days ago.      Past Medical History:  Diagnosis Date  . Hypertension   . Kidney stone     Patient Active Problem List   Diagnosis Date Noted  . Pain in left foot 03/29/2018  . Arthritis 02/13/2018  . Chronic back pain 02/13/2018  . Migraines 02/13/2018  . Morbid obesity with BMI of 40.0-44.9, adult ([place]) 02/13/2018  . Synovial cyst of lumbar spine 10/15/2017  . Osteoarthritis of knee 07/13/2017  . Lumbar disc disease with radiculopathy 08/01/2016  . Fatigue 05/04/2016  . Headache 05/04/2016  . Numbness of foot 05/04/2016  . 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 Arthrodesis status 02/02/2016  . Chronic pain syndrome 02/02/2016  . Elevated white blood cell count 12/01/2015  . Lumbar stenosis with neurogenic claudication 05/07/2015  . Bilateral leg edema 04/21/2015  . Carpal tunnel syndrome of right wrist 12/14/2014  . Renal atrophy, left 01/23/2014  . Achilles tendonitis 01/16/2014  . Right lower quadrant pain 12/18/2013  . Hypertension, benign 06/03/2010  . Urinary incontinence 06/03/2010  . Cervical disc disease 09/09/2009  . 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 ABDOMINAL HYSTERECTOMY    . BACK SURGERY    . CARPAL TUNNEL RELEASE    . CHOLECYSTECTOMY    . LITHOTRIPSY    . neck fusion     . TUBAL LIGATION      OB History   No obstetric history on file.      Home Medications    Prior to Admission medications   Medication Sig Start Date End Date Taking? Authorizing Provider  acetaminophen (TYLENOL) 500 MG tablet Take 500 mg by mouth every 6 (six) hours as needed for moderate pain.    [provider]  cephALEXin (KEFLEX) 500 MG capsule Take 1 capsule (500 mg total) by mouth 2 (two) times daily. Patient not taking: Reported on 07/25/2019 07/20/19   Ok Edwards, PA-C  cetirizine (ZYRTEC) 10 MG chewable tablet Chew 1 tablet (10 mg total) by mouth daily. 05/06/19 06/05/19  Sharion Balloon, NP  enalapril (VASOTEC) 10 MG tablet Take 10 mg by mouth daily.    [provider]  fluticasone (FLONASE) 50 MCG/ACT nasal spray Place 2 sprays into both nostrils daily. 09/08/19   Tasia Catchings, Amy V, PA-C  hydrochlorothiazide (HYDRODIURIL) 25 MG tablet Take 25 mg by mouth daily.    [provider]  HYDROcodone-acetaminophen (NORCO/VICODIN) 5-325 MG tablet Take 2 tablets by mouth every 4 (four) hours as needed. 09/05/19   Valarie Merino, MD  Multiple Vitamin (MULTIVITAMIN) capsule Take 1 capsule by mouth daily.     [provider]  potassium chloride SA (K-DUR,KLOR-CON) 20 MEQ tablet Take 20 mEq by mouth 2 (two) times daily.    [provider]  predniSONE (DELTASONE) 50 MG tablet Take  1 tablet (50 mg total) by mouth daily with breakfast. 09/08/19   Tasia Catchings, Amy V, PA-C  tiZANidine (ZANAFLEX) 4 MG tablet Take 1 tablet (4 mg total) by mouth every 6 (six) hours as needed for muscle spasms. 07/20/19   Tasia Catchings, Amy V, PA-C  diphenhydrAMINE (BENADRYL) 25 mg capsule Take by mouth. 03/10/16 06/21/19  [provider]  LYRICA 50 MG capsule Take 50 mg by mouth 3 (three) times daily. 04/15/18 06/07/19  [provider]    Family History Family History  Problem Relation Age of Onset  . Diabetes Mother   . Hypertension Mother   . Cancer Father   . Parkinson's disease  Father     Social History Social History   Tobacco Use  . Smoking status: Never Smoker  . Smokeless tobacco: Never Used  Substance Use Topics  . Alcohol use: No  . Drug use: Never     Allergies   Baclofen, Doxycycline, Percocet [oxycodone-acetaminophen], Septra [sulfamethoxazole-trimethoprim], and Gabapentin   Review of Systems Review of Systems  Reason unable to perform ROS: See HPI as above.     Physical Exam Triage Vital Signs ED Triage Vitals [09/08/19 1851]  Enc Vitals Group     BP (!) 166/78     Pulse Rate 88     Resp 18     Temp 98 F (36.7 C)     Temp Source Oral     SpO2 96 %     Weight      Height      Head Circumference      Peak Flow      Pain Score 6     Pain Loc      Pain Edu?      Excl. in [place]?    No data found.  Updated Vital Signs BP (!) 166/78 (BP Location: Left Arm)   Pulse 88   Temp 98 F (36.7 C) (Oral)   Resp 18   SpO2 96%   Physical Exam Constitutional:      General: She is not in acute distress.    Appearance: Normal appearance. She is not ill-appearing, toxic-appearing or diaphoretic.  HENT:     Head: Normocephalic and atraumatic.     Nose:     Right Sinus: Maxillary sinus tenderness and frontal sinus tenderness present.     Left Sinus: Maxillary sinus tenderness and frontal sinus tenderness present.     Mouth/Throat:     Mouth: Mucous membranes are moist.     Pharynx: Oropharynx is clear. Uvula midline.  Neck:     Musculoskeletal: Normal range of motion and neck supple.  Cardiovascular:     Rate and Rhythm: Normal rate and regular rhythm.     Heart sounds: Normal heart sounds. No murmur. No friction rub. No gallop.   Pulmonary:     Effort: Pulmonary effort is normal. No accessory muscle usage, prolonged expiration, respiratory distress or retractions.     Comments: Lungs clear to auscultation without adventitious lung sounds. Neurological:     General: No focal deficit present.     Mental Status: She is alert and  oriented to person, place, and time.      UC Treatments / Results  Labs (all labs ordered are listed, but only abnormal results are displayed) Labs Reviewed - No data to display  EKG   Radiology No results found.  Procedures Procedures (including critical care time)  Medications Ordered in UC Medications  ketorolac (TORADOL) 30 MG/ML injection 30 mg (  30 mg Intramuscular Given 09/08/19 1920)  dexamethasone (DECADRON) injection 10 mg (10 mg Intramuscular Given 09/08/19 1920)    Initial Impression / Assessment and Plan / UC Course  I have reviewed the triage vital signs and the nursing notes.  Pertinent labs & imaging results that were available during my care of the patient were reviewed by me and considered in my medical decision making (see chart for details).    Patient with negative COVID since symptom onset. Low suspicion for bacterial sinusitis at this time. toradol and decadron injection in office today for symptomatic management. Prednisone and nasal spray as directed. Return precautions given.  Final Clinical Impressions(s) / UC Diagnoses   Final diagnoses:  Sinus pressure  Nasal congestion   ED Prescriptions    Medication Sig Dispense Auth. Provider   predniSONE (DELTASONE) 50 MG tablet Take 1 tablet (50 mg total) by mouth daily with breakfast. 5 tablet Yu, Amy V, PA-C   fluticasone (FLONASE) 50 MCG/ACT nasal spray Place 2 sprays into both nostrils daily. 1 g Ok Edwards, PA-C     PDMP not reviewed this encounter.   Ok Edwards, PA-C 09/08/19 1935

## 2019-09-22 ENCOUNTER — Emergency Department (HOSPITAL_COMMUNITY): Payer: Self-pay

## 2019-09-22 ENCOUNTER — Encounter (HOSPITAL_COMMUNITY): Payer: Self-pay | Admitting: Emergency Medicine

## 2019-09-22 ENCOUNTER — Other Ambulatory Visit: Payer: Self-pay

## 2019-09-22 ENCOUNTER — Emergency Department (HOSPITAL_COMMUNITY)
Admission: EM | Admit: 2019-09-22 | Discharge: 2019-09-22 | Disposition: A | Discharge status: Home / Self Care | Payer: Self-pay | Attending: Emergency Medicine | Admitting: Emergency Medicine

## 2019-09-22 DIAGNOSIS — M5441 Lumbago with sciatica, right side: Secondary | ICD-10-CM | POA: Insufficient documentation

## 2019-09-22 DIAGNOSIS — Z79899 Other long term (current) drug therapy: Secondary | ICD-10-CM | POA: Insufficient documentation

## 2019-09-22 DIAGNOSIS — I10 Essential (primary) hypertension: Secondary | ICD-10-CM | POA: Insufficient documentation

## 2019-09-22 IMAGING — CR DG HIP (WITH OR WITHOUT PELVIS) 2-3V*R*
3 series · 3 of 3 positions shown · non-contrast
Comparison: None.

CLINICAL DATA: Pain

EXAM:
DG HIP (WITH OR WITHOUT PELVIS) 2-3V RIGHT

[t pelvis ap]
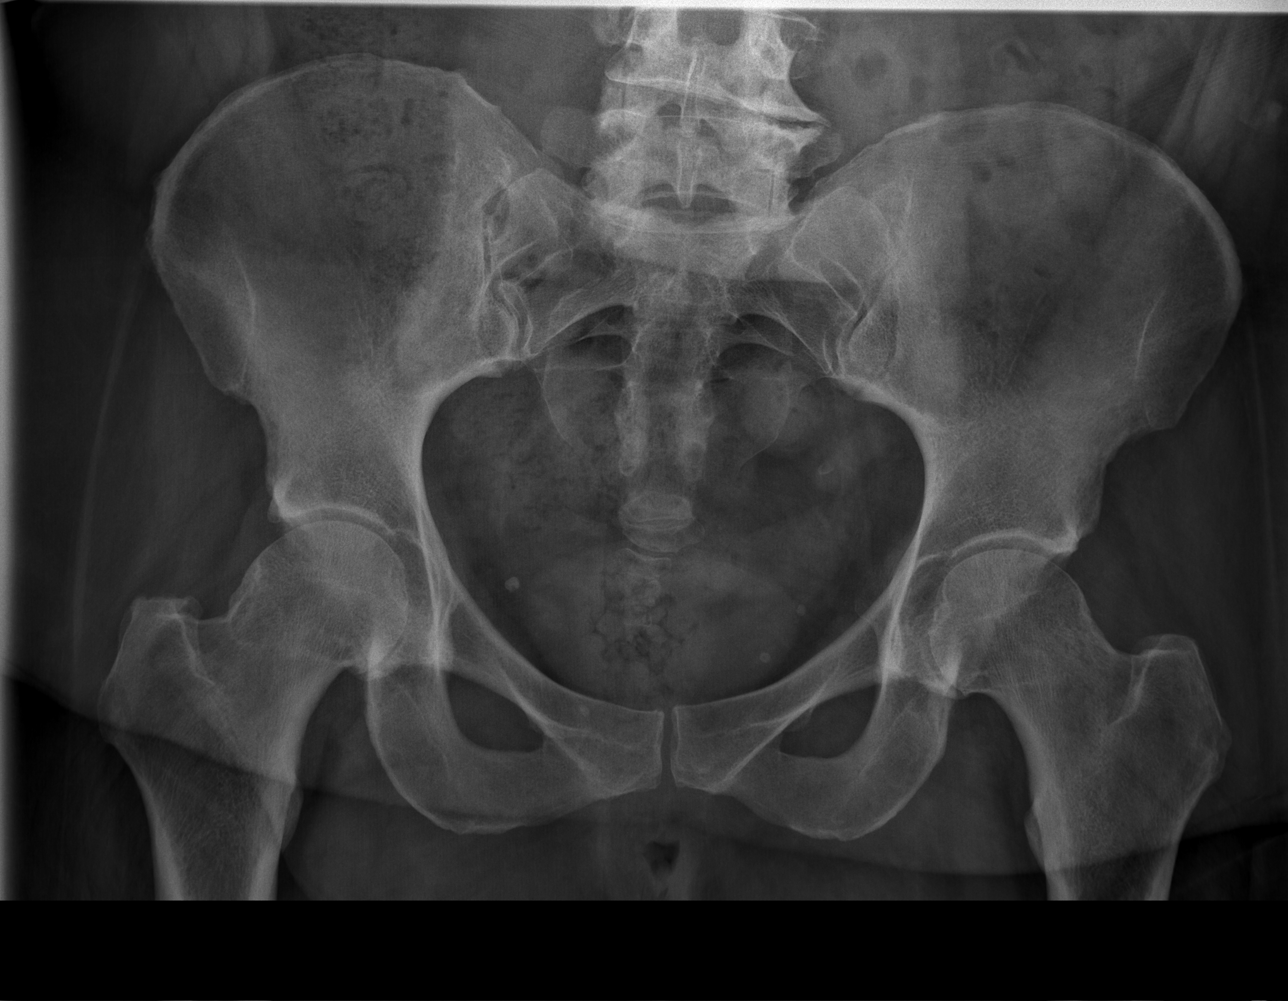

[t hip ap right]
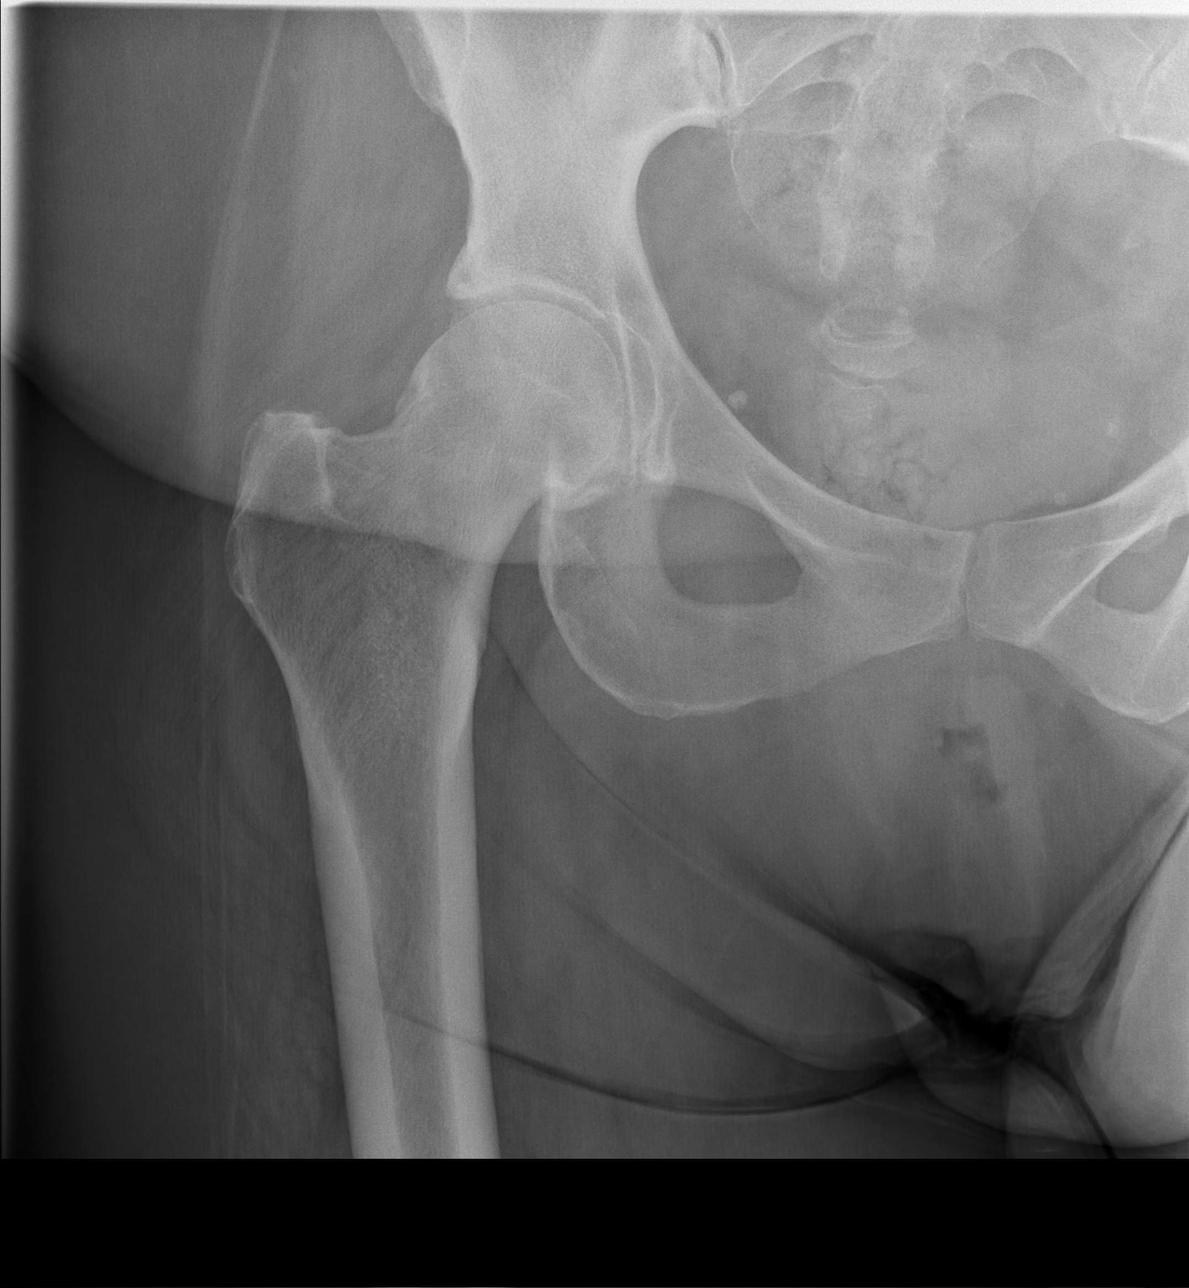

[t hip frog leg right]
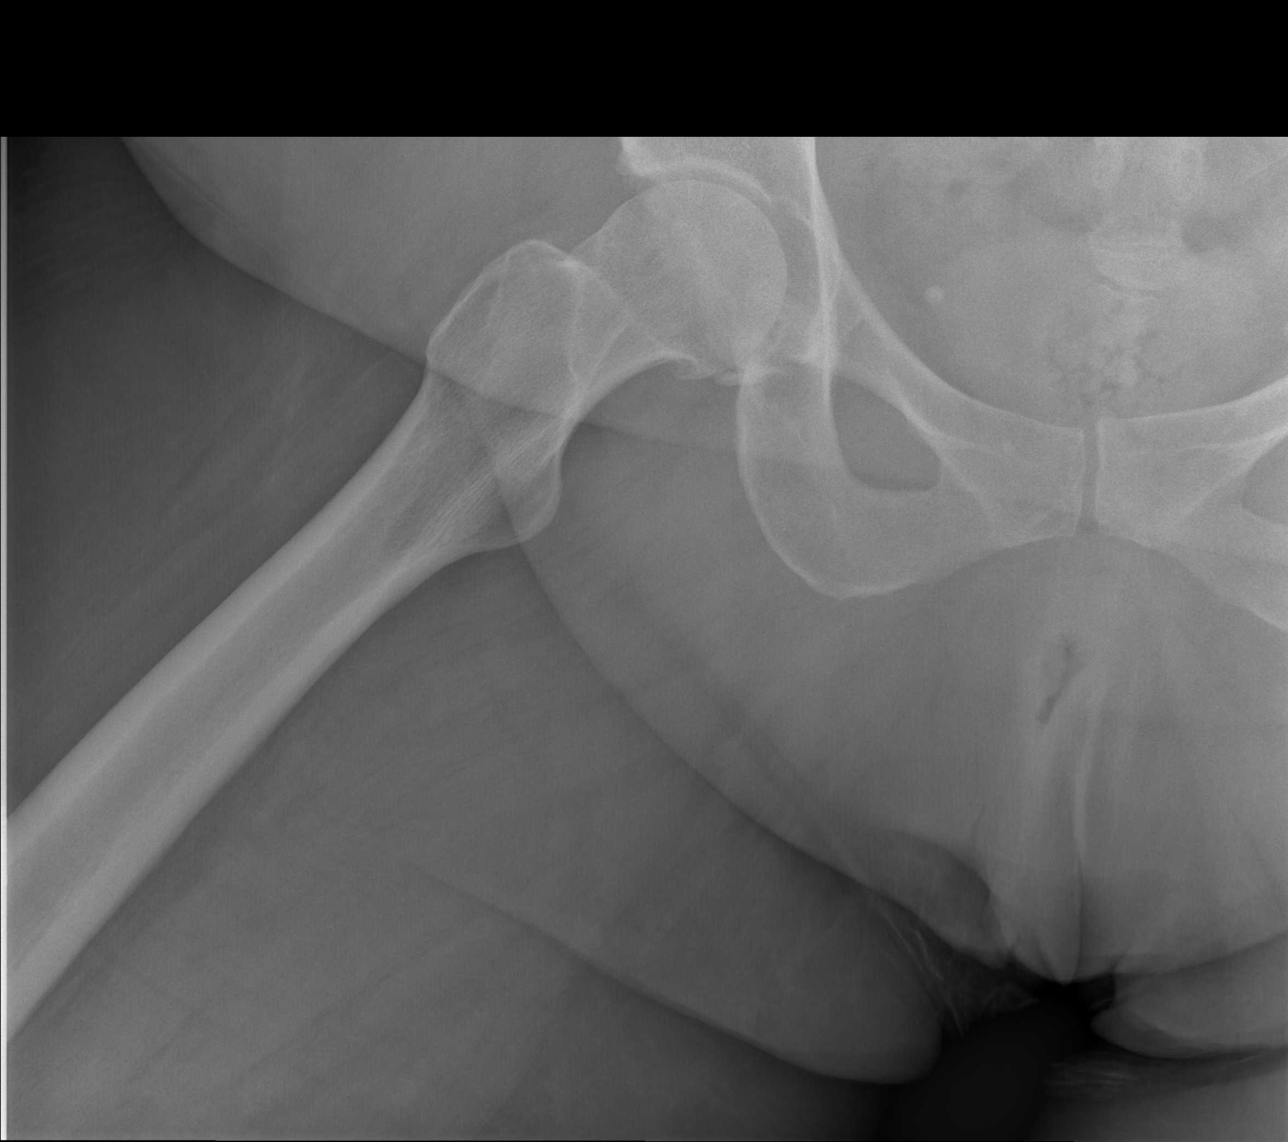

[3 of 3 positions shown; findings below may reference images not displayed]

FINDINGS: Frontal pelvis as well as frontal and lateral right hip images were
obtained. No fracture or dislocation. There is moderate symmetric
narrowing of each hip joint. No erosive change. Sacroiliac joints
bilaterally appear normal. There is degenerative change on the left
at L4-5.
IMPRESSION: Symmetric narrowing of each hip joint. No fracture or dislocation.
Osteoarthritic change noted in the lower lumbar region.

## 2019-09-22 IMAGING — CR DG LUMBAR SPINE COMPLETE 4+V
5 series · 5 of 5 positions shown · non-contrast
Comparison: [DATE]

CLINICAL DATA: Pain following fall

EXAM:
LUMBAR SPINE - COMPLETE 4+ VIEW

[t lumbar spine ap]
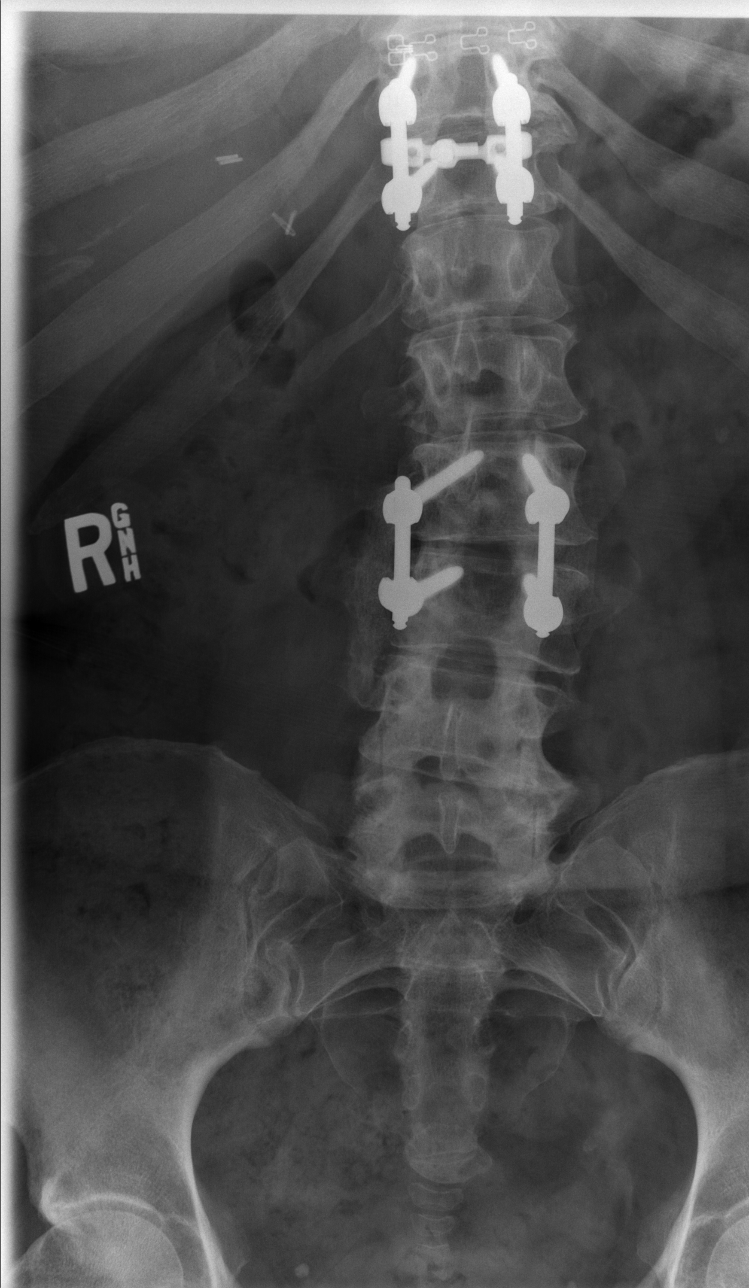

[t lumbar spine obl (1 of 2)]
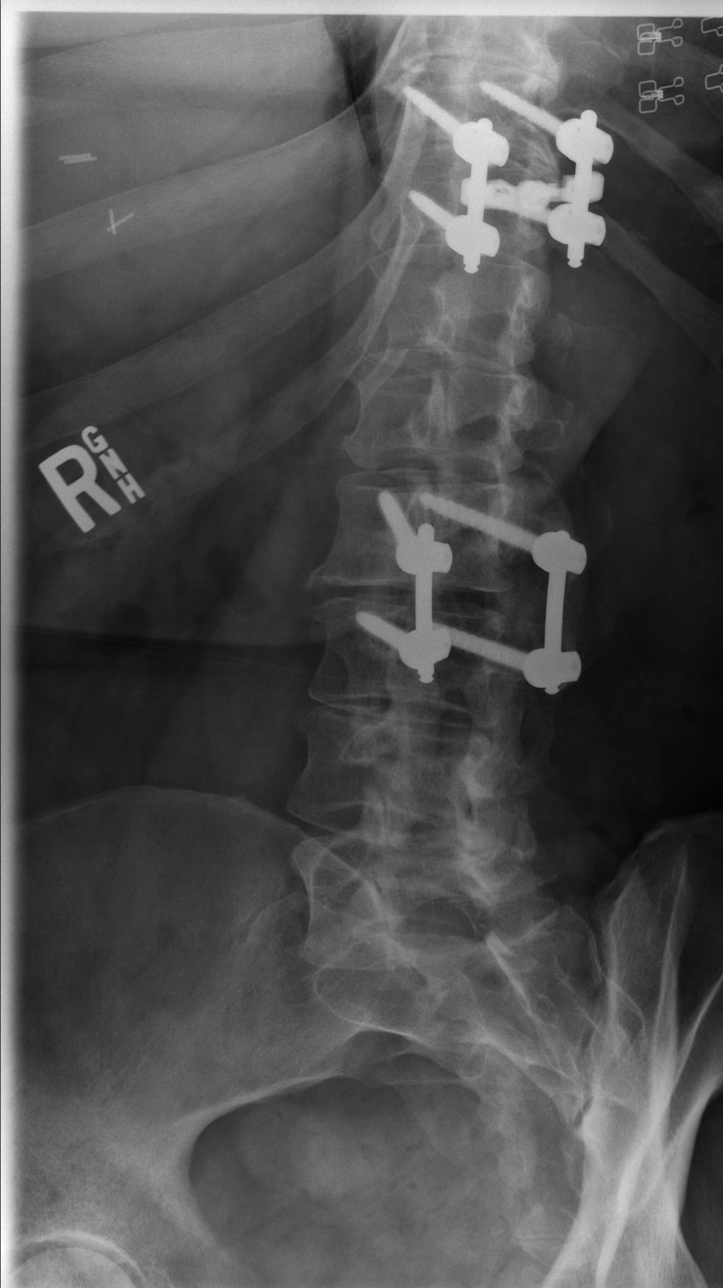

[t lumbar spine obl (2 of 2)]
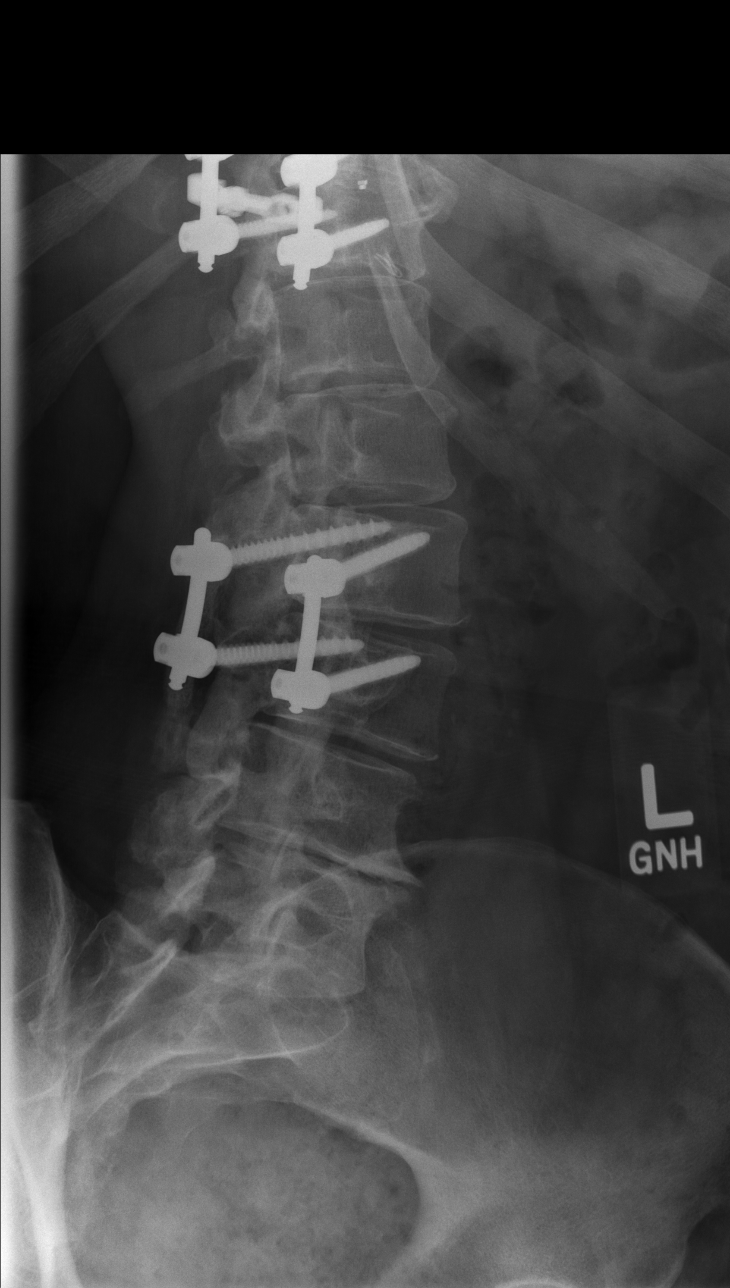

[t lumbar spine lat]
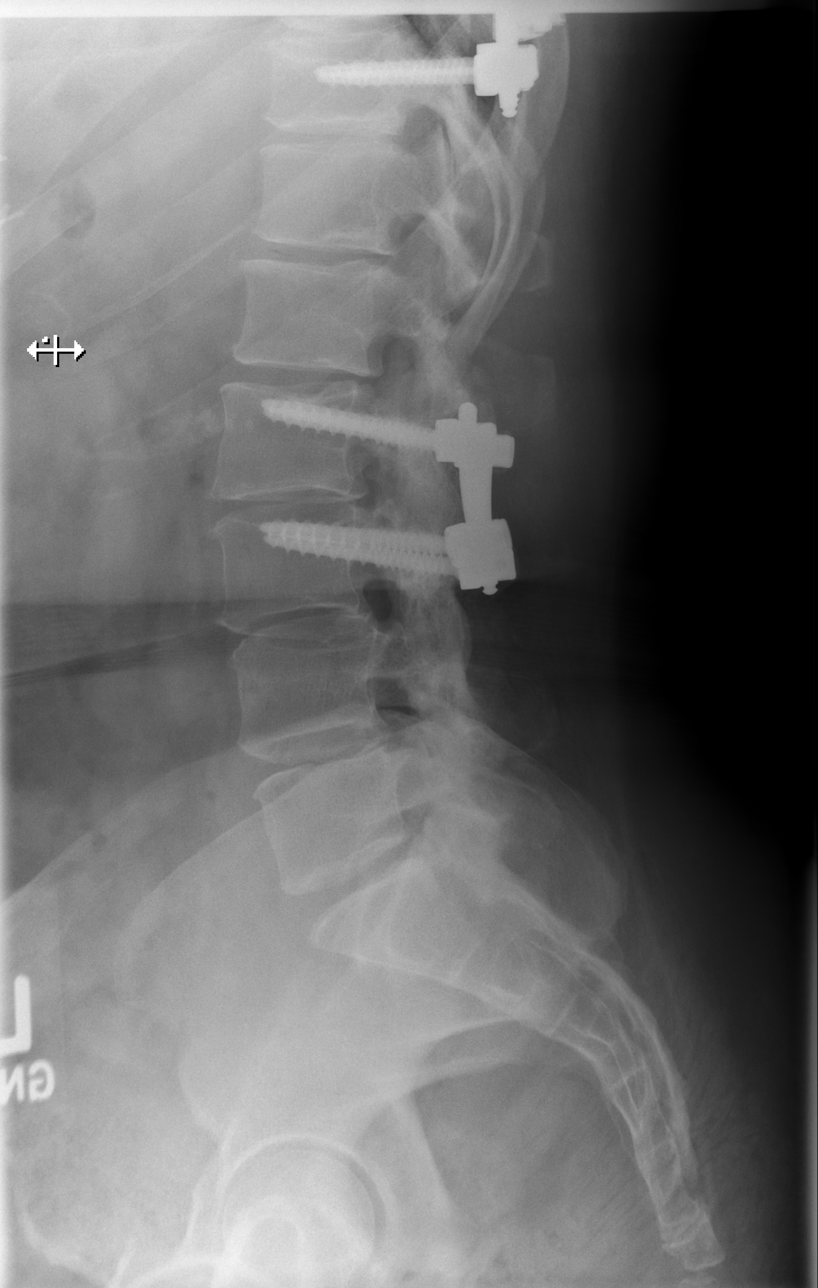

[t lumbar l-5 s-1 spot]
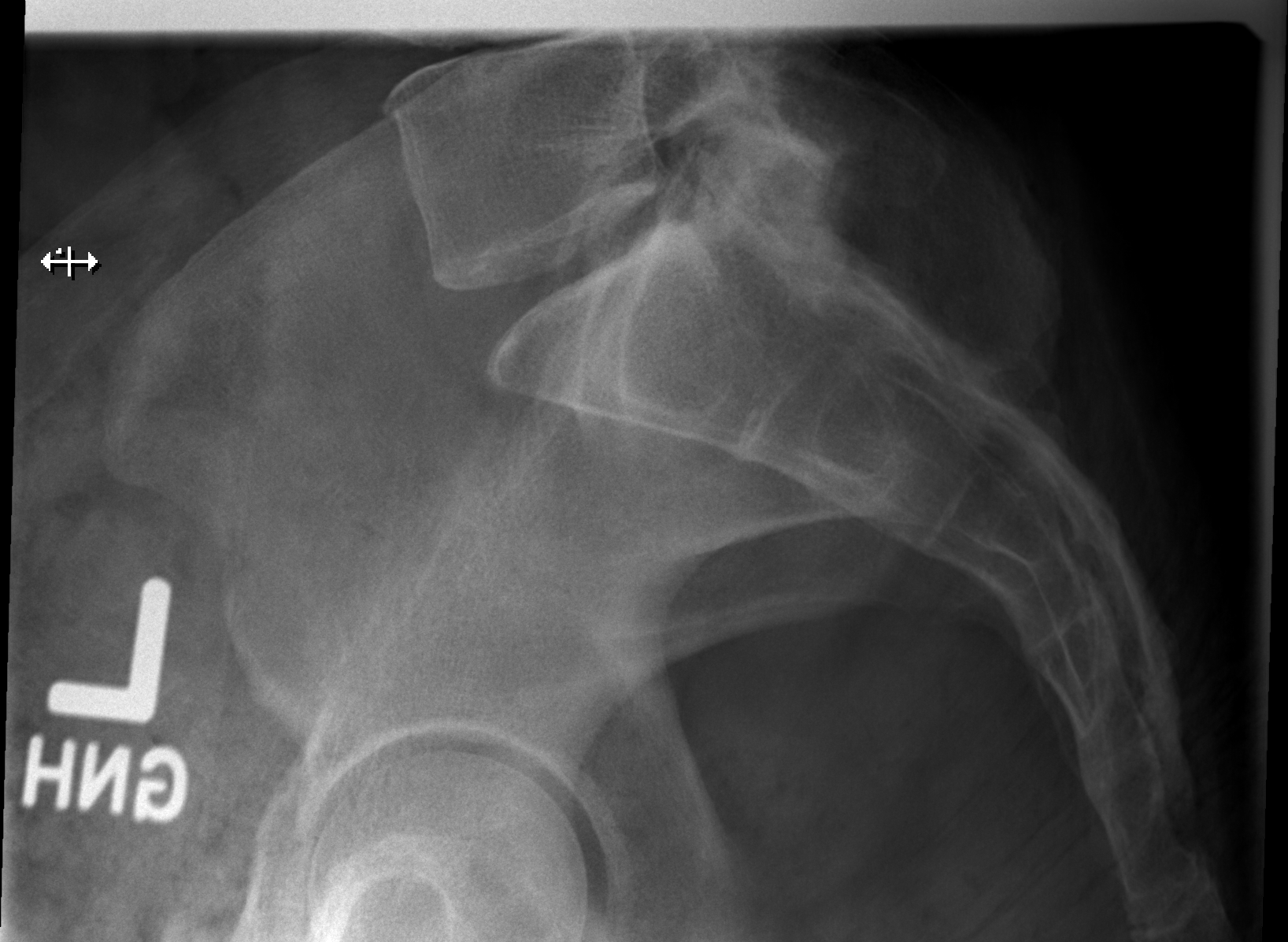

[5 of 5 positions shown; findings below may reference images not displayed]

FINDINGS: Frontal, lateral, spot lumbosacral lateral, and bilateral oblique
views were obtained. There are 5 non rib-bearing lumbar type
vertebral bodies. There is lumbar levoscoliosis with rotatory
component. Patient has had previous laminectomies at L2 and L3 with
posterior screw and plate fixation at L2 and L3. There is also
posterior screw and plate fixation at T10 and T11. Support hardware
in these areas appears intact. There is no fracture. There is 4 mm
of anterolisthesis of L3 on L4. No other spondylolisthesis evident.
There is moderate disc space narrowing at all levels, most notably
at L2-3 and L3-4. There is facet osteoarthritic change at L3-4,
L4-5, and L5-S1 bilaterally.

There is a 3 mm calcification either in or overlying the lower left
pelvis.
IMPRESSION: Postoperative changes at T10 and T11 as well as at L2 and L3 with
support hardware in these areas intact. No fracture evident. Mild
spondylolisthesis at L3-4. No other spondylolisthesis.
Osteoarthritic change noted at multiple levels. There is a degree of
levoscoliosis with rotatory component.

Question small calculus lower pole left kidney.

## 2019-09-22 MED ORDER — MORPHINE SULFATE (PF) 4 MG/ML IV SOLN: 4.0000 mg | Freq: Once | INTRAVENOUS | Status: DC

## 2019-09-22 MED ORDER — PREDNISONE 20 MG PO TABS: 60.0000 mg | ORAL_TABLET | Freq: Every day | ORAL | 0 refills | Status: AC

## 2019-09-22 MED ORDER — LIDOCAINE 5 % EX PTCH
1.0000 | MEDICATED_PATCH | CUTANEOUS | Status: DC
  Administered 2019-09-22: 1 via TRANSDERMAL
  Filled 2019-09-22: qty 1

## 2019-09-22 MED ORDER — HYDROCODONE-ACETAMINOPHEN 5-325 MG PO TABS
2.0000 | ORAL_TABLET | Freq: Once | ORAL | Status: AC
  Administered 2019-09-22: 2 via ORAL
  Filled 2019-09-22: qty 2

## 2019-09-22 MED ORDER — HYDROCODONE-ACETAMINOPHEN 5-325 MG PO TABS: 1.0000 | ORAL_TABLET | ORAL | 0 refills | Status: DC | PRN

## 2019-09-22 MED ORDER — MORPHINE SULFATE (PF) 4 MG/ML IV SOLN
4.0000 mg | Freq: Once | INTRAVENOUS | Status: AC
  Administered 2019-09-22: 14:00:00 4 mg via INTRAMUSCULAR
  Filled 2019-09-22: qty 1

## 2019-09-22 NOTE — ED Provider Notes (Signed)
[facility] DEPT Provider Note   CSN: [id number] Arrival date & time: 09/22/19  [id number]    History Chief Complaint  Patient presents with  . Hip Pain   DEERICKA LUNNY is a 48 y.o. female with past medical history who presents for evaluation of right hip pain. Pain began 2 days ago. Pain located to right lower lumbar and posterior right hip. Radiates down the back of her leg. Worse with movement. Did fall 2 weeks ago, seen in ED with negative films at that time. Pain resolved however returned. No new injury or fall.  Followed by Dr. Wynn Banker with neurosurgery at [facility].  Has prior history of lumbar spinal fusions.  No IV drug use, bowel or bladder incontinence, saddle paresthesias.  Able to walk however with pain.  No paresthesias to extremities, no fever, chills, nausea, vomiting, chest pain, shortness of breath, abdominal pain, flank pain, redness, swelling, warmth.  Denies additional aggravating or alleviating factors.  History obtained from patient and past medical records.  No interpreter is used.  HPI     Past Medical History:  Diagnosis Date  . Hypertension   . Kidney stone     Patient Active Problem List   Diagnosis Date Noted  . Pain in left foot 03/29/2018  . Arthritis 02/13/2018  . Chronic back pain 02/13/2018  . Migraines 02/13/2018  . Morbid obesity with BMI of 40.0-44.9, adult ([place]) 02/13/2018  . Synovial cyst of lumbar spine 10/15/2017  . Osteoarthritis of knee 07/13/2017  . Lumbar disc disease with radiculopathy 08/01/2016  . Fatigue 05/04/2016  . Headache 05/04/2016  . Numbness of foot 05/04/2016  . 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 Arthrodesis status 02/02/2016  . Chronic pain syndrome 02/02/2016  . Elevated white blood cell count 12/01/2015  . Lumbar stenosis with neurogenic claudication 05/07/2015  . Bilateral leg edema 04/21/2015  . Carpal tunnel syndrome of right wrist 12/14/2014  . Renal atrophy, left 01/23/2014  . Achilles  tendonitis 01/16/2014  . Right lower quadrant pain 12/18/2013  . Hypertension, benign 06/03/2010  . Urinary incontinence 06/03/2010  . Cervical disc disease 09/09/2009  . 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 ABDOMINAL HYSTERECTOMY    . BACK SURGERY    . CARPAL TUNNEL RELEASE    . CHOLECYSTECTOMY    . LITHOTRIPSY    . neck fusion    . TUBAL LIGATION       OB History   No obstetric history on file.     Family History  Problem Relation Age of Onset  . Diabetes Mother   . Hypertension Mother   . Cancer Father   . Parkinson's disease Father     Social History   Tobacco Use  . Smoking status: Never Smoker  . Smokeless tobacco: Never Used  Substance Use Topics  . Alcohol use: No  . Drug use: Never    Home Medications Prior to Admission medications   Medication Sig Start Date End Date Taking? Authorizing Provider  acetaminophen (TYLENOL) 650 MG CR tablet Take 650 mg by mouth every 8 (eight) hours as needed for pain.   Yes [provider]  enalapril (VASOTEC) 10 MG tablet Take 10 mg by mouth daily.   Yes [provider]  fluticasone (FLONASE) 50 MCG/ACT nasal spray Place 2 sprays into both nostrils daily. 09/08/19  Yes Yu, Amy V, PA-C  hydrochlorothiazide (HYDRODIURIL) 25 MG tablet Take 25 mg by mouth daily.   Yes  [provider]  Multiple Vitamin (MULTIVITAMIN) capsule Take 1 capsule by mouth daily.    Yes [provider]  potassium chloride SA (K-DUR,KLOR-CON) 20 MEQ tablet Take 20 mEq by mouth 2 (two) times daily.   Yes [provider]  tiZANidine (ZANAFLEX) 4 MG tablet Take 1 tablet (4 mg total) by mouth every 6 (six) hours as needed for muscle spasms. 07/20/19  Yes Yu, Amy V, PA-C  cephALEXin (KEFLEX) 500 MG capsule Take 1 capsule (500 mg total) by mouth 2 (two) times daily. Patient not taking: Reported on 07/25/2019 07/20/19   Ok Edwards, PA-C  cetirizine (ZYRTEC) 10 MG  chewable tablet Chew 1 tablet (10 mg total) by mouth daily. 05/06/19 06/05/19  Sharion Balloon, NP  HYDROcodone-acetaminophen (NORCO/VICODIN) 5-325 MG tablet Take 1 tablet by mouth every 4 (four) hours as needed. 09/22/19   Holbert Caples A, PA-C  predniSONE (DELTASONE) 20 MG tablet Take 3 tablets (60 mg total) by mouth daily for 5 days. 09/22/19 09/27/19  Kamile Fassler A, PA-C  diphenhydrAMINE (BENADRYL) 25 mg capsule Take by mouth. 03/10/16 06/21/19  [provider]  LYRICA 50 MG capsule Take 50 mg by mouth 3 (three) times daily. 04/15/18 06/07/19  [provider]    Allergies    Baclofen, Doxycycline, Percocet [oxycodone-acetaminophen], Septra [sulfamethoxazole-trimethoprim], and Gabapentin  Review of Systems   Review of Systems  Constitutional: Negative.   HENT: Negative.   Respiratory: Negative.   Cardiovascular: Negative.   Gastrointestinal: Negative.   Genitourinary: Negative.   Musculoskeletal: Positive for back pain. Negative for arthralgias, gait problem, joint swelling, myalgias, neck pain and neck stiffness.       Right hip pain  Skin: Negative.   Neurological: Negative.   All other systems reviewed and are negative.  Physical Exam Updated Vital Signs BP (!) 158/119   Pulse 94   Temp 98.8 F (37.1 C) (Oral)   Resp 16   SpO2 97%   Physical Exam Physical Exam  Constitutional: Pt appears well-developed and well-nourished. No distress.  HENT:  Head: Normocephalic and atraumatic.  Mouth/Throat: Oropharynx is clear and moist. No oropharyngeal exudate.  Eyes: Conjunctivae are normal.  Neck: Normal range of motion. Neck supple.  Full ROM without pain  Cardiovascular: Normal rate, regular rhythm and intact distal pulses.   Pulmonary/Chest: Effort normal and breath sounds normal. No respiratory distress. Pt has no wheezes.  Abdominal: Soft. Pt exhibits no distension. There is no tenderness, rebound or guarding. No abd bruit or pulsatile mass Musculoskeletal:   Full range of motion of the T-spine and L-spine with flexion, hyperextension, and lateral flexion. No midline tenderness or stepoffs. No tenderness to palpation of the spinous processes of the T-spine or L-spine. Moderate tenderness to palpation of the paraspinous muscles of the L-spin on RIGHT. Positive straight leg raise on right at 40'. Tenderness over piriformis and SI to right. Lymphadenopathy:    Pt has no cervical adenopathy.  Neurological: Pt is alert. Pt has normal reflexes.  Reflex Scores:      Bicep reflexes are 2+ on the right side and 2+ on the left side.      Brachioradialis reflexes are 2+ on the right side and 2+ on the left side.      Patellar reflexes are 2+ on the right side and 2+ on the left side.      Achilles reflexes are 2+ on the right side and 2+ on the left side. Speech is clear and goal oriented, follows commands Normal  5/5 strength in upper and lower extremities bilaterally including dorsiflexion and plantar flexion, strong and equal grip strength Sensation normal to light and sharp touch Moves extremities without ataxia, coordination intact Normal gait Normal balance No Clonus Skin: Skin is warm and dry. No rash noted or lesions noted. Pt is not diaphoretic. No erythema, ecchymosis,edema or warmth.  Psychiatric: Pt has a normal mood and affect. Behavior is normal.  Nursing note and vitals reviewed. ED Results / Procedures / Treatments   Labs (all labs ordered are listed, but only abnormal results are displayed) Labs Reviewed - No data to display  EKG None  Radiology DG Lumbar Spine Complete  Result Date: 09/22/2019 CLINICAL DATA:  Pain following fall EXAM: LUMBAR SPINE - COMPLETE 4+ VIEW COMPARISON:  April 17, 2015 FINDINGS: Frontal, lateral, spot lumbosacral lateral, and bilateral oblique views were obtained. There are 5 non rib-bearing lumbar type vertebral bodies. There is lumbar levoscoliosis with rotatory component. Patient has had previous  laminectomies at L2 and L3 with posterior screw and plate fixation at L2 and L3. There is also posterior screw and plate fixation at QA348G and T11. Support hardware in these areas appears intact. There is no fracture. There is 4 mm of anterolisthesis of L3 on L4. No other spondylolisthesis evident. There is moderate disc space narrowing at all levels, most notably at L2-3 and L3-4. There is facet osteoarthritic change at L3-4, L4-5, and L5-S1 bilaterally. There is a 3 mm calcification either in or overlying the lower left pelvis. IMPRESSION: Postoperative changes at T10 and T11 as well as at L2 and L3 with support hardware in these areas intact. No fracture evident. Mild spondylolisthesis at L3-4. No other spondylolisthesis. Osteoarthritic change noted at multiple levels. There is a degree of levoscoliosis with rotatory component. Question small calculus lower pole left kidney. Electronically Signed   By: Lowella Grip III M.D.   On: 09/22/2019 12:14   DG Hip Unilat  With Pelvis 2-3 Views Right  Result Date: 09/22/2019 CLINICAL DATA:  Pain EXAM: DG HIP (WITH OR WITHOUT PELVIS) 2-3V RIGHT COMPARISON:  None. FINDINGS: Frontal pelvis as well as frontal and lateral right hip images were obtained. No fracture or dislocation. There is moderate symmetric narrowing of each hip joint. No erosive change. Sacroiliac joints bilaterally appear normal. There is degenerative change on the left at L4-5. IMPRESSION: Symmetric narrowing of each hip joint. No fracture or dislocation. Osteoarthritic change noted in the lower lumbar region. Electronically Signed   By: Lowella Grip III M.D.   On: 09/22/2019 12:11    Procedures Procedures (including critical care time)  Medications Ordered in ED Medications  morphine 4 MG/ML injection 4 mg (has no administration in time range)  lidocaine (LIDODERM) 5 % 1 patch (has no administration in time range)  HYDROcodone-acetaminophen (NORCO/VICODIN) 5-325 MG per tablet 2  tablet (2 tablets Oral Given 09/22/19 1142)   ED Course  I have reviewed the triage vital signs and the nursing notes.  Pertinent labs & imaging results that were available during my care of the patient were reviewed by me and considered in my medical decision making (see chart for details).  48 year old who presents for evaluation of back pain and right hip pain. Afebrile non septic, non ill appearing.  No IV drug use, bowel or bladder incontinence, saddle paresthesia.  No overlying skin changes to lumbar spine, right hip.  Straight leg raise on right at 40 degrees.  Tenderness palpation over right piriformis and SI. Ambulatory in  ED without difficulty. Low suspicion for septic joint, gout, hemarthrosis, compartment syndrome, vascular occlusion, bacterial infectious process.  Patient with back pain.  No neurological deficits and normal neuro exam.  Patient can walk but states is painful.  No loss of bowel or bladder control.  No concern for cauda equina.  No fever, night sweats, weight loss, h/o cancer, IVDU.  RICE protocol and pain medicine indicated and discussed with patient.   The patient has been appropriately medically screened and/or stabilized in the ED. I have low suspicion for any other emergent medical condition which would require further screening, evaluation or treatment in the ED or require inpatient management.  Patient is hemodynamically stable and in no acute distress.  Patient able to ambulate in department prior to ED.  Evaluation does not show acute pathology that would require ongoing or additional emergent interventions while in the emergency department or further inpatient treatment.  I have discussed the diagnosis with the patient and answered all questions.  Pain is been managed while in the emergency department and patient has no further complaints prior to discharge.  Patient is comfortable with plan discussed in room and is stable for discharge at this time.  I have  discussed strict return precautions for returning to the emergency department.  Patient was encouraged to follow-up with PCP/specialist refer to at discharge.    MDM Rules/Calculators/A&P                       Final Clinical Impression(s) / ED Diagnoses Final diagnoses:  Acute right-sided low back pain with right-sided sciatica    Rx / DC Orders ED Discharge Orders         Ordered    HYDROcodone-acetaminophen (NORCO/VICODIN) 5-325 MG tablet  Every 4 hours PRN     09/22/19 1253    predniSONE (DELTASONE) 20 MG tablet  Daily     09/22/19 1253           Darreld Hoffer A, PA-C 09/22/19 1318    Rancour, Annie Main, MD 09/22/19 1606

## 2019-09-22 NOTE — ED Triage Notes (Signed)
Per pt, states she woke up this am with right hip pain radiating to right foot-states no injury or trauma-states she fell 2 weeks ago but landed on her knees

## 2019-09-22 NOTE — Discharge Instructions (Addendum)
Follow up with your Neurosurgeon. Return for new or worsening symptoms.

## 2019-09-23 NOTE — ED Notes (Signed)
Per Dr Melanee Left, ok to extend work note till Monday 09/29/2019

## 2019-09-30 ENCOUNTER — Encounter: Payer: Self-pay | Admitting: Emergency Medicine

## 2019-09-30 ENCOUNTER — Other Ambulatory Visit: Payer: Self-pay

## 2019-09-30 ENCOUNTER — Ambulatory Visit
Admission: EM | Admit: 2019-09-30 | Discharge: 2019-09-30 | Disposition: A | Discharge status: Home / Self Care | Payer: Self-pay | Attending: Emergency Medicine | Admitting: Emergency Medicine

## 2019-09-30 DIAGNOSIS — R200 Anesthesia of skin: Secondary | ICD-10-CM

## 2019-09-30 MED ORDER — METHYLPREDNISOLONE SODIUM SUCC 125 MG IJ SOLR
125.0000 mg | Freq: Once | INTRAMUSCULAR | Status: AC
  Administered 2019-09-30: 125 mg via INTRAMUSCULAR

## 2019-09-30 NOTE — Discharge Instructions (Signed)
You were given steroid shot today. You may use hot compresses for additional relief. Important to do stretches provided. Follow-up with PCP/Ortho for further evaluation, management. Please go to ER if no improvement, or worsening of current symptoms, inability to move leg, foot numbness, hip pain, change in bowel/bladder habit, fever.

## 2019-09-30 NOTE — ED Triage Notes (Addendum)
Pt presents to [facility] for assessment of right leg numbness starting Friday.  Patient states hx of multiple back surgeries to her back and her neck.  States she was seen Monday of last week at ER for right lower back pain and treated with steroids.  Patient states inner thigh, front of thigh and side are mostly numb.  Denies loss of bowel or bladder except chronic hx of bladder incontinence.  Patient is able to ambulate, but states the area feels like there is a band wrapped around, and feels like she is pulling dead weight in her thigh.  Denies any pain at this time.  Patient has to pick her leg up to move it.    Patient c/o three falls in the past week and a half.  PT c/o burning sensation to right thigh when standing for extended periods of time.

## 2019-09-30 NOTE — ED Provider Notes (Signed)
[facility]    CSN: [id number] Arrival date & time: 09/30/19  1341      History   Chief Complaint Chief Complaint  Patient presents with  . Numbness    HPI MAKHARI DESPIRITO is a 48 y.o. female history hypertension, renal calculi presenting for right thigh pain/numbness.  States it feels like there is a "rubber band around the thigh ".  Patient states this started last night: Has had to pick her leg up to move it more easily.  Of note, patient has had numerous back surgeries, for she is followed by Ortho.  Denies trauma to the area, inciting event, recent injury, surgery, stasis.  Patient was seen in the ER for similar concern on 12/21.  Please review those records which were reviewed with me at time of appointment.  Patient had acute fracture ruled out via lumbar spine XR, was given Norco, prednisone which alleviated pain.  Patient states similar symptoms returned 2 days after discontinuation of medication, prompting today's evaluation.  Patient denies fever, saddle area anesthesia, change in bowel or bladder habit.  States low back pain is no longer there, more in her thigh.  Patient denies history of blood clot, unilateral leg swelling or discoloration.   Past Medical History:  Diagnosis Date  . Hypertension   . Kidney stone     Patient Active Problem List   Diagnosis Date Noted  . Lumbar spinal stenosis 10/01/2019  . Pain in left foot 03/29/2018  . Arthritis 02/13/2018  . Chronic back pain 02/13/2018  . Migraines 02/13/2018  . Morbid obesity with BMI of 40.0-44.9, adult ([place]) 02/13/2018  . Synovial cyst of lumbar spine 10/15/2017  . Osteoarthritis of knee 07/13/2017  . Lumbar disc disease with radiculopathy 08/01/2016  . Fatigue 05/04/2016  . Headache 05/04/2016  . Numbness of foot 05/04/2016  . 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 Arthrodesis status 02/02/2016  . Chronic pain syndrome 02/02/2016  . Elevated white blood cell count 12/01/2015  . Lumbar  stenosis with neurogenic claudication 05/07/2015  . Bilateral leg edema 04/21/2015  . Carpal tunnel syndrome of right wrist 12/14/2014  . Renal atrophy, left 01/23/2014  . Achilles tendonitis 01/16/2014  . Right lower quadrant pain 12/18/2013  . Hypertension, benign 06/03/2010  . Urinary incontinence 06/03/2010  . Cervical disc disease 09/09/2009  . 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 ABDOMINAL HYSTERECTOMY    . BACK SURGERY    . CARPAL TUNNEL RELEASE    . CHOLECYSTECTOMY    . LITHOTRIPSY    . neck fusion    . TUBAL LIGATION      OB History   No obstetric history on file.      Home Medications    Prior to Admission medications   Medication Sig Start Date End Date Taking? Authorizing Provider  Ascorbic Acid (VITAMIN C) 500 MG CAPS Take 500 mg by mouth daily.    [provider]  cetirizine (ZYRTEC) 10 MG chewable tablet Chew 1 tablet (10 mg total) by mouth daily. 05/06/19 06/05/19  Sharion Balloon, NP  cholecalciferol (VITAMIN D3) 25 MCG (1000 UT) tablet Take 1,000 Units by mouth daily.    [provider]  enalapril (VASOTEC) 10 MG tablet Take 10 mg by mouth daily.    [provider]  fluticasone (FLONASE) 50 MCG/ACT nasal spray Place 2 sprays into both nostrils daily. Patient not taking: Reported on 10/01/2019 09/08/19   Ok Edwards, PA-C  hydrochlorothiazide (HYDRODIURIL) 25 MG tablet Take 25 mg by mouth daily.    [provider]  HYDROcodone-acetaminophen (NORCO/VICODIN) 5-325 MG tablet Take 1 tablet by mouth every 4 (four) hours as needed. Patient not taking: Reported on 10/01/2019 09/22/19   Henderly, Britni A, PA-C  Multiple Vitamin (MULTIVITAMIN) capsule Take 1 capsule by mouth daily.     [provider]  naproxen sodium (ALEVE) 220 MG tablet Take 220 mg by mouth 2 (two) times daily as needed (pain).    [provider]  potassium chloride SA (K-DUR,KLOR-CON) 20 MEQ tablet  Take 20 mEq by mouth 2 (two) times daily.    [provider]  tiZANidine (ZANAFLEX) 4 MG tablet Take 1 tablet (4 mg total) by mouth every 6 (six) hours as needed for muscle spasms. 07/20/19   Tasia Catchings, Amy V, PA-C  diphenhydrAMINE (BENADRYL) 25 mg capsule Take by mouth. 03/10/16 06/21/19  [provider]  LYRICA 50 MG capsule Take 50 mg by mouth 3 (three) times daily. 04/15/18 06/07/19  [provider]    Family History Family History  Problem Relation Age of Onset  . Diabetes Mother   . Hypertension Mother   . Cancer Father   . Parkinson's disease Father     Social History Social History   Tobacco Use  . Smoking status: Never Smoker  . Smokeless tobacco: Never Used  Substance Use Topics  . Alcohol use: No  . Drug use: Never     Allergies   Baclofen, Doxycycline, Percocet [oxycodone-acetaminophen], Septra [sulfamethoxazole-trimethoprim], and Gabapentin   Review of Systems Review of Systems  Constitutional: Negative for fatigue and fever.  Respiratory: Negative for cough and shortness of breath.   Cardiovascular: Negative for chest pain and palpitations.  Musculoskeletal:       Positive for right thigh pain  Neurological: Positive for numbness. Negative for weakness.     Physical Exam Triage Vital Signs ED Triage Vitals [09/30/19 1522]  Enc Vitals Group     BP 129/85     Pulse Rate 98     Resp 16     Temp 97.9 F (36.6 C)     Temp Source Oral     SpO2 96 %     Weight      Height      Head Circumference      Peak Flow      Pain Score      Pain Loc      Pain Edu?      Excl. in [place]?    No data found.  Updated Vital Signs BP 129/85 (BP Location: Left Arm)   Pulse 98   Temp 97.9 F (36.6 C) (Oral)   Resp 16   SpO2 96%   Visual Acuity Right Eye Distance:   Left Eye Distance:   Bilateral Distance:    Right Eye Near:   Left Eye Near:    Bilateral Near:     Physical Exam Constitutional:      General: She is not in acute  distress. HENT:     Head: Normocephalic and atraumatic.  Eyes:     General: No scleral icterus.    Pupils: Pupils are equal, round, and reactive to light.  Cardiovascular:     Rate and Rhythm: Normal rate.  Pulmonary:     Effort: Pulmonary effort is normal.  Musculoskeletal:     Right hip: No bony tenderness. Decreased strength.     Left hip: No bony tenderness. Normal strength.  Right upper leg: No swelling, edema, deformity, lacerations, tenderness or bony tenderness.     Left upper leg: No swelling, edema, deformity, lacerations, tenderness or bony tenderness.     Right knee: No swelling, bony tenderness or crepitus.     Left knee: No swelling or bony tenderness.       Legs:     Comments: Lumbar spine without spinous process tenderness.  No PSIS tenderness of hips bilaterally.  Negative Homans sign  Skin:    Capillary Refill: Capillary refill takes less than 2 seconds.     Coloration: Skin is not jaundiced or pale.     Findings: No bruising, erythema, lesion or rash.     Comments: No discoloration of right anterolateral thigh as compared to left.  Does feel slightly cool with patient reported decreased sensation, "I can feel her hand there but it feels different ".  No masses, nodules appreciated.  Neurological:     General: No focal deficit present.     Mental Status: She is alert and oriented to person, place, and time.     Sensory: Sensory deficit present.     Gait: Gait normal.     Deep Tendon Reflexes:     Reflex Scores:      Patellar reflexes are 2+ on the right side and 2+ on the left side.      Achilles reflexes are 2+ on the right side and 2+ on the left side.     UC Treatments / Results  Labs (all labs ordered are listed, but only abnormal results are displayed) Labs Reviewed - No data to display  EKG   Radiology   Procedures Procedures (including critical care time)  Medications Ordered in UC Medications  methylPREDNISolone sodium succinate  (SOLU-MEDROL) 125 mg/2 mL injection 125 mg (125 mg Intramuscular Given 09/30/19 1641)    Initial Impression / Assessment and Plan / UC Course  I have reviewed the triage vital signs and the nursing notes.  Pertinent labs & imaging results that were available during my care of the patient were reviewed by me and considered in my medical decision making (see chart for details).     Patient afebrile, nontoxic.  No alarm symptoms such as saddle area seizure, change in bowel or bladder habit.  Physical exam significant only for slightly decreased temperature and sensation of right anterolateral thigh as compared to left.  Patient does have numerous lower back surgeries which could be contributory.  Lumbar x-ray deferred at this time with patient's agreement as there has been no trauma more pain to the affected area since previous imaging last week and would likely have low yield/impact on treatment.  Lower concern for blood clot at this time as legs are symmetric, without discoloration of right as compared to left.  Patient responded well to Norco, prednisone after last ER visit.  IM Solu-Medrol given in office which patient tolerated well.  Will do additional supportive measures as outlined below.  Stressed importance of following up with Ortho for further surveillance and evaluation such as MRI should symptoms persist or worsen.  ER return precautions discussed, patient verbalized understanding and is agreeable to plan. Final Clinical Impressions(s) / UC Diagnoses   Final diagnoses:  Right leg numbness     Discharge Instructions     You were given steroid shot today. You may use hot compresses for additional relief. Important to do stretches provided. Follow-up with PCP/Ortho for further evaluation, management. Please go to ER if no improvement, or  worsening of current symptoms, inability to move leg, foot numbness, hip pain, change in bowel/bladder habit, fever.    ED Prescriptions    None      PDMP not reviewed this encounter.   Neldon Mc Jenkins, [place] 10/01/19 2044

## 2019-09-30 NOTE — ED Notes (Signed)
Patient able to ambulate independently  

## 2019-10-01 ENCOUNTER — Encounter (HOSPITAL_COMMUNITY): Payer: Self-pay

## 2019-10-01 ENCOUNTER — Other Ambulatory Visit: Payer: Self-pay

## 2019-10-01 ENCOUNTER — Emergency Department (HOSPITAL_COMMUNITY): Payer: Medicaid Other

## 2019-10-01 ENCOUNTER — Inpatient Hospital Stay (HOSPITAL_COMMUNITY)
Admission: EM | Admit: 2019-10-01 | Discharge: 2019-10-05 | DRG: 454 | Disposition: A | Discharge status: Home Health | Payer: Medicaid Other | Attending: Neurosurgery | Admitting: Neurosurgery

## 2019-10-01 DIAGNOSIS — M5116 Intervertebral disc disorders with radiculopathy, lumbar region: Secondary | ICD-10-CM | POA: Diagnosis present

## 2019-10-01 DIAGNOSIS — Z20828 Contact with and (suspected) exposure to other viral communicable diseases: Secondary | ICD-10-CM | POA: Diagnosis not present

## 2019-10-01 DIAGNOSIS — G894 Chronic pain syndrome: Secondary | ICD-10-CM | POA: Diagnosis present

## 2019-10-01 DIAGNOSIS — Z6837 Body mass index (BMI) 37.0-37.9, adult: Secondary | ICD-10-CM | POA: Diagnosis not present

## 2019-10-01 DIAGNOSIS — M4726 Other spondylosis with radiculopathy, lumbar region: Secondary | ICD-10-CM | POA: Diagnosis present

## 2019-10-01 DIAGNOSIS — S343XXA Injury of cauda equina, initial encounter: Secondary | ICD-10-CM

## 2019-10-01 DIAGNOSIS — Z79899 Other long term (current) drug therapy: Secondary | ICD-10-CM | POA: Diagnosis not present

## 2019-10-01 DIAGNOSIS — M48061 Spinal stenosis, lumbar region without neurogenic claudication: Secondary | ICD-10-CM | POA: Diagnosis present

## 2019-10-01 DIAGNOSIS — Z20822 Contact with and (suspected) exposure to covid-19: Secondary | ICD-10-CM | POA: Diagnosis present

## 2019-10-01 DIAGNOSIS — N390 Urinary tract infection, site not specified: Secondary | ICD-10-CM | POA: Diagnosis present

## 2019-10-01 DIAGNOSIS — N3941 Urge incontinence: Secondary | ICD-10-CM | POA: Diagnosis not present

## 2019-10-01 DIAGNOSIS — M48062 Spinal stenosis, lumbar region with neurogenic claudication: Secondary | ICD-10-CM | POA: Diagnosis present

## 2019-10-01 DIAGNOSIS — Z419 Encounter for procedure for purposes other than remedying health state, unspecified: Secondary | ICD-10-CM

## 2019-10-01 DIAGNOSIS — I10 Essential (primary) hypertension: Secondary | ICD-10-CM | POA: Diagnosis present

## 2019-10-01 DIAGNOSIS — Z90710 Acquired absence of both cervix and uterus: Secondary | ICD-10-CM

## 2019-10-01 DIAGNOSIS — R296 Repeated falls: Secondary | ICD-10-CM | POA: Diagnosis present

## 2019-10-01 DIAGNOSIS — M5126 Other intervertebral disc displacement, lumbar region: Secondary | ICD-10-CM

## 2019-10-01 DIAGNOSIS — M545 Low back pain: Secondary | ICD-10-CM | POA: Diagnosis present

## 2019-10-01 LAB — URINALYSIS, ROUTINE W REFLEX MICROSCOPIC
Bilirubin Urine: NEGATIVE
Glucose, UA: NEGATIVE mg/dL
Hgb urine dipstick: NEGATIVE
Ketones, ur: NEGATIVE mg/dL
Nitrite: POSITIVE — AB
Protein, ur: NEGATIVE mg/dL
Specific Gravity, Urine: 1.013 (ref 1.005–1.030)
pH: 6 (ref 5.0–8.0)

## 2019-10-01 LAB — BASIC METABOLIC PANEL
Anion gap: 11 (ref 5–15)
BUN: 19 mg/dL (ref 6–20)
CO2: 22 mmol/L (ref 22–32)
Calcium: 9 mg/dL (ref 8.9–10.3)
Chloride: 104 mmol/L (ref 98–111)
Creatinine, Ser: 0.76 mg/dL (ref 0.44–1.00)
GFR calc Af Amer: >60 mL/min (ref >60)
GFR calc non Af Amer: >60 mL/min (ref >60)
Glucose, Bld: 129 mg/dL — ABNORMAL HIGH (ref 70–99)
Potassium: 4.2 mmol/L (ref 3.5–5.1)
Sodium: 137 mmol/L (ref 135–145)

## 2019-10-01 LAB — CBC WITH DIFFERENTIAL/PLATELET
Abs Immature Granulocytes: 0.11 10*3/uL — ABNORMAL HIGH (ref 0.00–0.07)
Basophils Absolute: 0 10*3/uL (ref 0.0–0.1)
Basophils Relative: 0 %
Eosinophils Absolute: 0 10*3/uL (ref 0.0–0.5)
Eosinophils Relative: 0 %
HCT: 44 % (ref 36.0–46.0)
Hemoglobin: 13.9 g/dL (ref 12.0–15.0)
Immature Granulocytes: 1 %
Lymphocytes Relative: 10 %
Lymphs Abs: 1.5 10*3/uL (ref 0.7–4.0)
MCH: 28.1 pg (ref 26.0–34.0)
MCHC: 31.6 g/dL (ref 30.0–36.0)
MCV: 88.9 fL (ref 80.0–100.0)
Monocytes Absolute: 0.2 10*3/uL (ref 0.1–1.0)
Monocytes Relative: 1 %
Neutro Abs: 12.7 10*3/uL — ABNORMAL HIGH (ref 1.7–7.7)
Neutrophils Relative %: 88 %
Platelets: 336 10*3/uL (ref 150–400)
RBC: 4.95 MIL/uL (ref 3.87–5.11)
RDW: 14.2 % (ref 11.5–15.5)
WBC: 14.5 10*3/uL — ABNORMAL HIGH (ref 4.0–10.5)
nRBC: 0 % (ref 0.0–0.2)

## 2019-10-01 LAB — POC SARS CORONAVIRUS 2 AG -  ED: SARS Coronavirus 2 Ag: NEGATIVE

## 2019-10-01 LAB — HIV ANTIBODY (ROUTINE TESTING W REFLEX): HIV Screen 4th Generation wRfx: NONREACTIVE

## 2019-10-01 LAB — SURGICAL PCR SCREEN
MRSA, PCR: NEGATIVE
Staphylococcus aureus: NEGATIVE

## 2019-10-01 LAB — POC URINE PREG, ED: Preg Test, Ur: NEGATIVE

## 2019-10-01 IMAGING — MR MR LUMBAR SPINE W/O CM
4 of 5 series · 17 of 48 positions shown · non-contrast
Comparison: Lumbar spine radiographs [DATE]

CLINICAL DATA: Low back pain, progressive neurologic deficit.
Additional history provided: Status post fall 3 weeks ago, worsening
right leg weakness since 3 a.m. today unable to lift right leg,
unable to ambulate, numbness right leg.

EXAM:
MRI LUMBAR SPINE WITHOUT CONTRAST
TECHNIQUE: Multiplanar, multisequence MR imaging of the lumbar spine was
performed. No intravenous contrast was administered.

[Series 3: T1 · sagittal · 4.0mm · 0.53mm/px · 3 of 14 slices shown (1 of 2)]
[im 3/14]
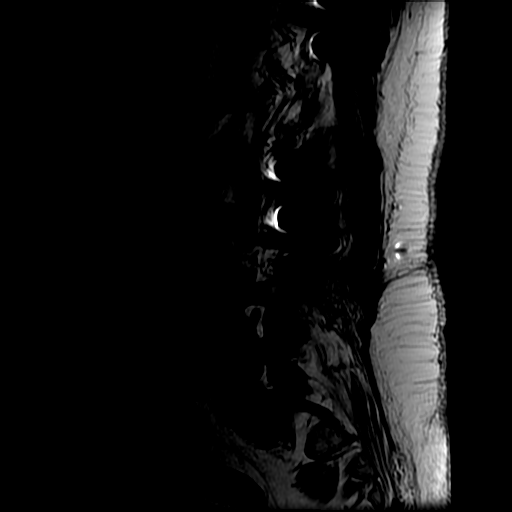
[im 8/14]
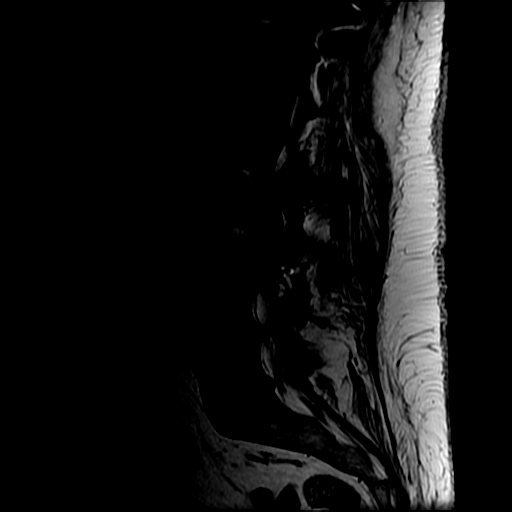
[im 14/14]
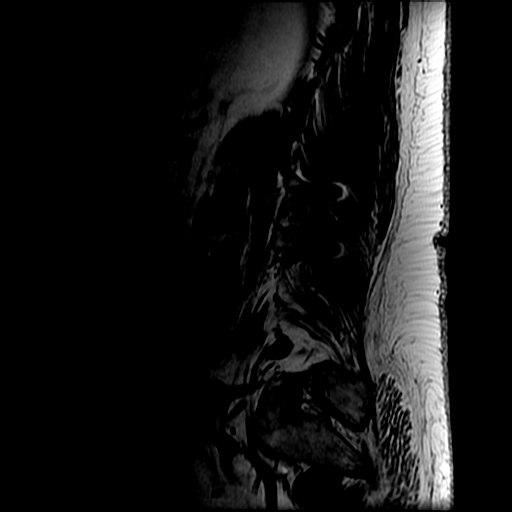

[Series 4: T2 post-contrast · sagittal · 4.0mm · 0.53mm/px · 6 of 14 slices shown]
[im 1/14]
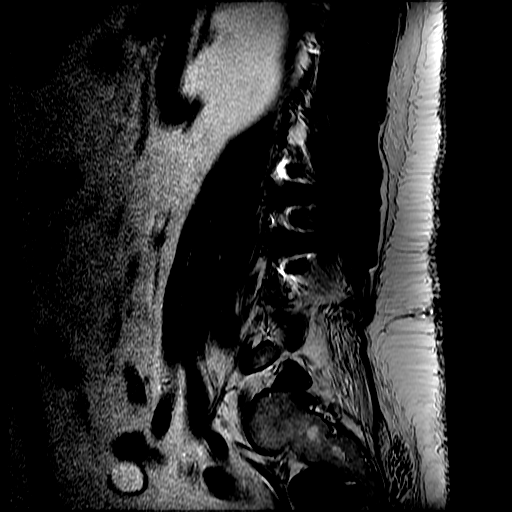
[im 3/14]
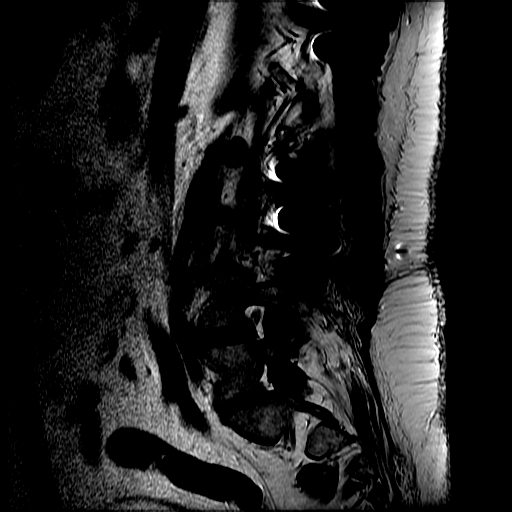
[im 6/14]
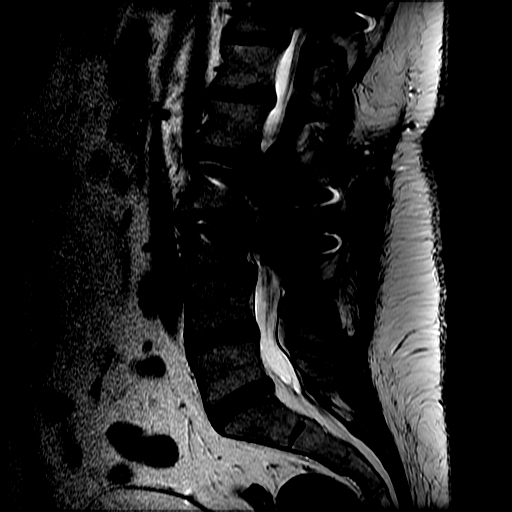
[im 8/14]
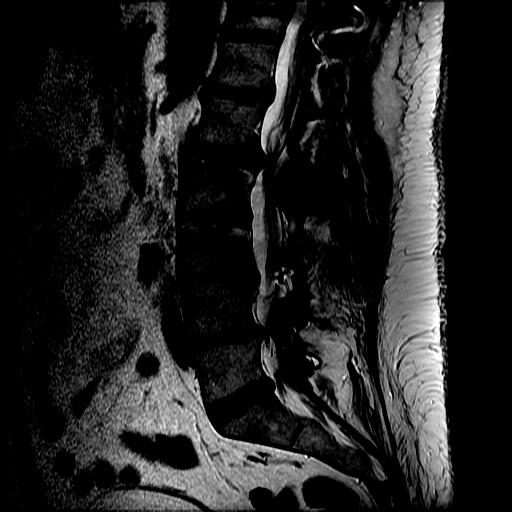
[im 11/14]
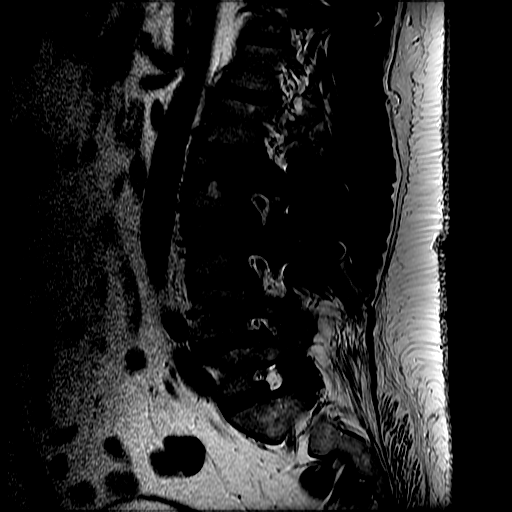
[im 14/14]
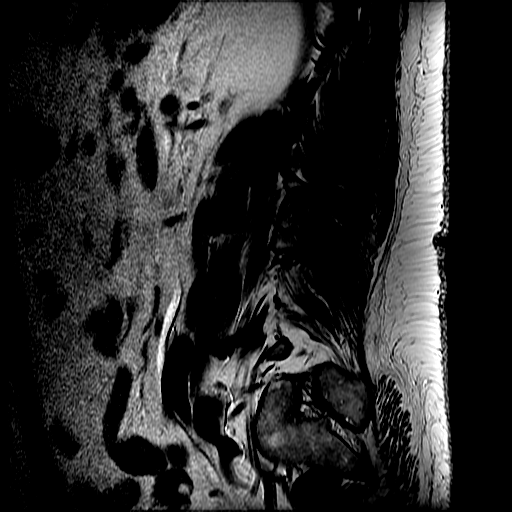

[Series 6: T2 · axial · 4.0mm · 0.41mm/px · z∈[-7,+162]mm · 5 of 37 slices shown]
[im 1/37]
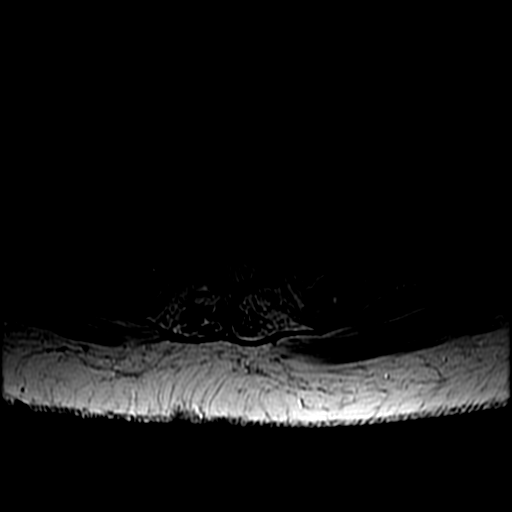
[im 6/37]
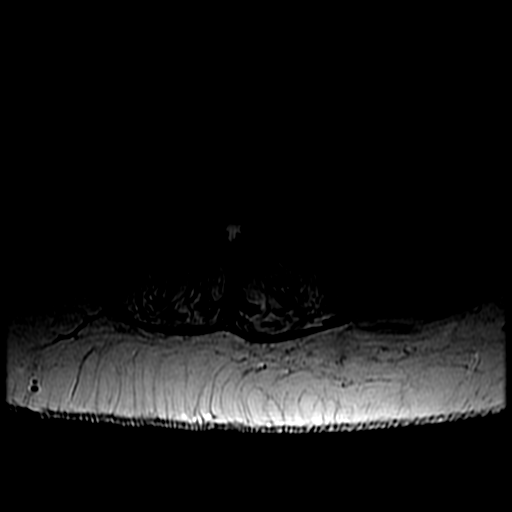
[im 11/37]
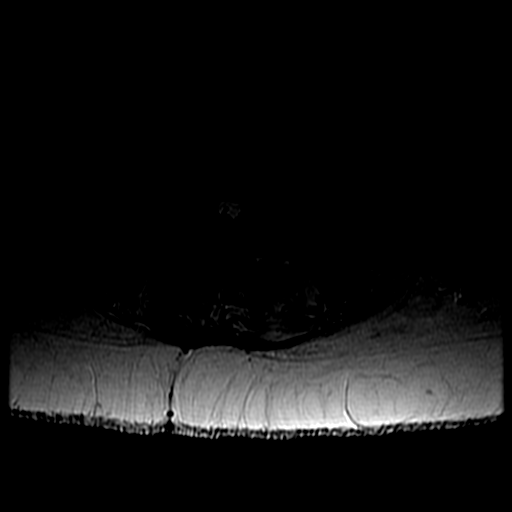
[im 19/37]
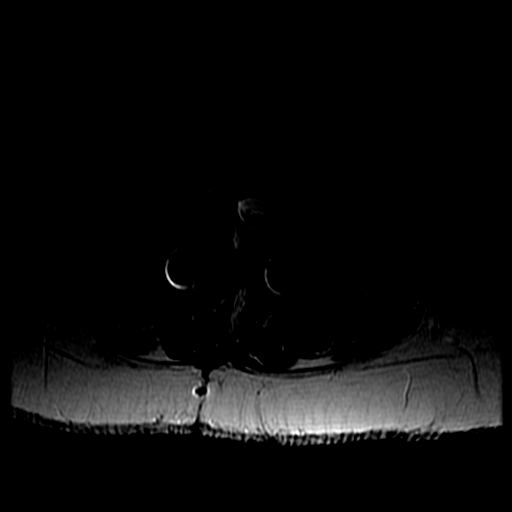
[im 31/37]
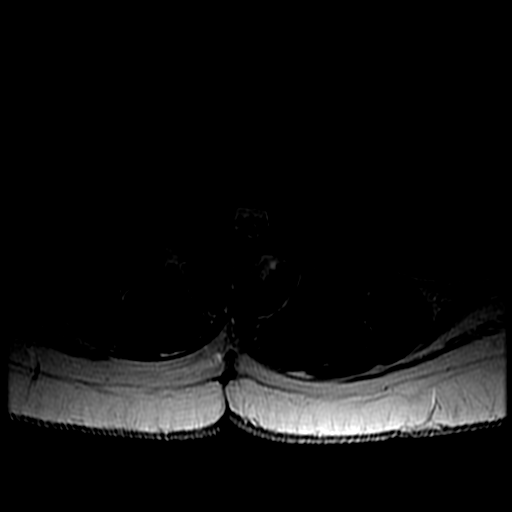

[Series 7: T1 · axial · 4.0mm · 0.41mm/px · z∈[+18,+162]mm · 3 of 37 slices shown (2 of 2)]
[im 6/37]
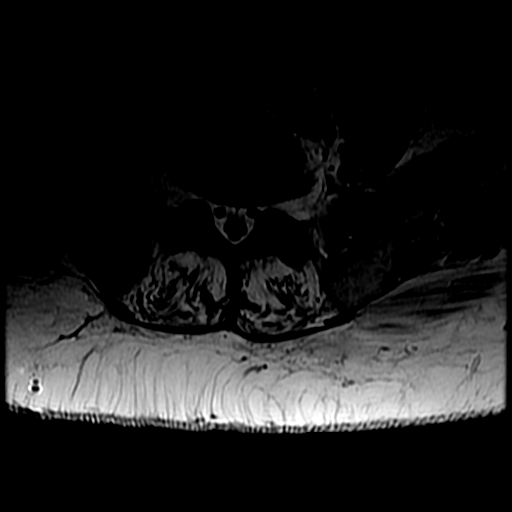
[im 19/37]
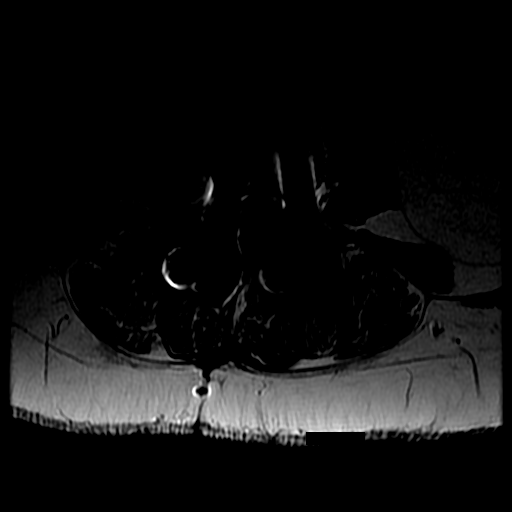
[im 31/37]
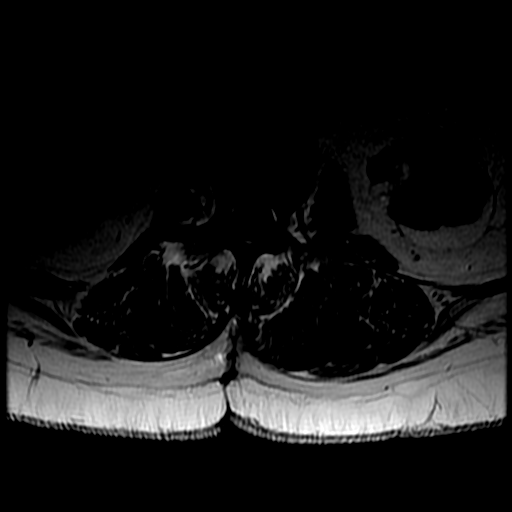

[17 of 48 positions shown; findings below may reference images not displayed]

FINDINGS: Segmentation: Spinal numbering will remain consistent with prior
examinations. The lowest well-formed intervertebral disc is
designated L5-S1.

Alignment: Lumbar levocurvature. Trace T12-L1 and L1-L2
retrolisthesis.

Vertebrae: Vertebral body height is maintained. Incompletely imaged
posterior spinal fusion construct at T10-T11. Sequela of prior L2-L3
posterior decompression with posterior spinal fusion construct at
this level. Susceptibility artifact arising from the fusion hardware
slightly limits evaluation of marrow signal at these levels. No
appreciable marrow edema or suspicious osseous lesion. L1 vertebral
body hemangioma.

Conus medullaris and cauda equina: Conus extends to the T12 level.
No signal abnormality within the visualized distal spinal cord.

Paraspinal and other soft tissues: No abnormality identified within
included portions of the abdomen/retroperitoneum. Postsurgical
changes to the paraspinal soft tissues. Marked atrophy of the lower
lumbar paraspinal musculature.

Disc levels:

Mild multilevel disc degeneration.

T12-L1: Small disc bulge. Superimposed tiny cranially migrated
central disc extrusion. No significant spinal canal stenosis or
neural foraminal narrowing.

L1-L2: Disc bulge. Superimposed moderate-sized right
center/subarticular disc extrusion with mild caudal migration.
Facet/ligamentum flavum hypertrophy. Severe spinal canal stenosis
with encroachment upon right greater than left descending cauda
equina nerve roots. Redundancy of the cauda equina nerve roots
cephalad to this level. Mild right neural foraminal narrowing.

L2-L3: Sequela of prior posterior decompression and fusion. Disc
bulge with endplate spurring. Spinal canal widely patent. No
significant foraminal stenosis.

L3-L4: Sequela of prior posterior decompression. Small disc bulge
with endplate spurring. Facet hypertrophy. Spinal canal widely
patent. No significant neural foraminal narrowing.

L4-L5: Disc bulge asymmetric to the left with endplate spurring.
Moderate facet arthrosis with ligamentum flavum hypertrophy. 8 mm
posteriorly projecting synovial facet cyst on the left. Moderate
left subarticular stenosis with crowding of the descending left L5
nerve root. Mild relative central canal narrowing. Mild/moderate
left neural foraminal narrowing.

L5-S1: Small disc bulge. Mild facet arthrosis/ligamentum flavum
hypertrophy. No significant spinal canal stenosis or neural
foraminal narrowing.

These results were called by telephone at the time of interpretation
on [DATE] at [DATE] to provider Dr. INNOCENT, who verbally
acknowledged these results.
IMPRESSION: Spinal numbering will remain consistent with previous examinations.

Sequela of prior posterior decompression at L2-L3 and L3-L4 with
L2-L3 posterior spinal fusion construct. Spinal canal widely patent
at these levels.

Incompletely imaged T10-T11 posterior spinal fusion construct.

At L1-L2, a moderate-sized right center/subarticular caudally
migrated disc extrusion contributes to severe spinal canal stenosis
with encroachment upon right greater than left cauda equina nerve
roots. There is redundancy of the cauda equina nerve roots cephalad
to this level. Mild right neural foraminal narrowing.

At L4-L5, multifactorial left subarticular stenosis with crowding of
the descending left L5 nerve root. Mild central canal narrowing and
mild/moderate left neural foraminal narrowing also present at this
level.

## 2019-10-01 MED ORDER — ONDANSETRON HCL 4 MG/2ML IJ SOLN: 4.0000 mg | Freq: Four times a day (QID) | INTRAMUSCULAR | Status: DC | PRN

## 2019-10-01 MED ORDER — POTASSIUM CHLORIDE CRYS ER 20 MEQ PO TBCR
20.0000 meq | EXTENDED_RELEASE_TABLET | Freq: Two times a day (BID) | ORAL | Status: DC
  Administered 2019-10-01 – 2019-10-05 (×8): 20 meq via ORAL
  Filled 2019-10-01 (×8): qty 1

## 2019-10-01 MED ORDER — ONDANSETRON HCL 4 MG PO TABS: 4.0000 mg | ORAL_TABLET | Freq: Four times a day (QID) | ORAL | Status: DC | PRN

## 2019-10-01 MED ORDER — DIPHENHYDRAMINE HCL 25 MG PO CAPS
25.0000 mg | ORAL_CAPSULE | Freq: Four times a day (QID) | ORAL | Status: DC | PRN
  Administered 2019-10-01 – 2019-10-02 (×2): 25 mg via ORAL
  Filled 2019-10-01 (×2): qty 1

## 2019-10-01 MED ORDER — OXYCODONE HCL 5 MG PO TABS
5.0000 mg | ORAL_TABLET | ORAL | Status: DC | PRN
  Administered 2019-10-01 – 2019-10-02 (×3): 5 mg via ORAL
  Filled 2019-10-01 (×3): qty 1

## 2019-10-01 MED ORDER — POLYETHYLENE GLYCOL 3350 17 G PO PACK: 17.0000 g | PACK | Freq: Every day | ORAL | Status: DC | PRN

## 2019-10-01 MED ORDER — SODIUM CHLORIDE 0.9 % IV SOLN: INTRAVENOUS | Status: DC

## 2019-10-01 MED ORDER — DEXAMETHASONE SODIUM PHOSPHATE 4 MG/ML IJ SOLN
4.0000 mg | Freq: Four times a day (QID) | INTRAMUSCULAR | Status: DC
  Administered 2019-10-01 – 2019-10-05 (×13): 4 mg via INTRAVENOUS
  Filled 2019-10-01 (×13): qty 1

## 2019-10-01 MED ORDER — TIZANIDINE HCL 4 MG PO TABS
4.0000 mg | ORAL_TABLET | Freq: Four times a day (QID) | ORAL | Status: DC | PRN
  Administered 2019-10-04 (×2): 4 mg via ORAL
  Filled 2019-10-01 (×3): qty 1

## 2019-10-01 MED ORDER — CEPHALEXIN 500 MG PO CAPS
500.0000 mg | ORAL_CAPSULE | Freq: Three times a day (TID) | ORAL | Status: DC
  Administered 2019-10-01 – 2019-10-05 (×11): 500 mg via ORAL
  Filled 2019-10-01 (×13): qty 1

## 2019-10-01 MED ORDER — DOCUSATE SODIUM 100 MG PO CAPS
100.0000 mg | ORAL_CAPSULE | Freq: Two times a day (BID) | ORAL | Status: DC
  Administered 2019-10-01 – 2019-10-05 (×8): 100 mg via ORAL
  Filled 2019-10-01 (×8): qty 1

## 2019-10-01 MED ORDER — MUPIROCIN 2 % EX OINT
1.0000 "application " | TOPICAL_OINTMENT | Freq: Two times a day (BID) | CUTANEOUS | Status: DC
  Administered 2019-10-01 – 2019-10-05 (×8): 1 via NASAL
  Filled 2019-10-01 (×4): qty 22

## 2019-10-01 MED ORDER — ACETAMINOPHEN 325 MG PO TABS
650.0000 mg | ORAL_TABLET | Freq: Four times a day (QID) | ORAL | Status: DC | PRN
  Administered 2019-10-01: 650 mg via ORAL

## 2019-10-01 MED ORDER — MORPHINE SULFATE (PF) 4 MG/ML IV SOLN
4.0000 mg | Freq: Once | INTRAVENOUS | Status: AC
  Administered 2019-10-01: 10:00:00 4 mg via INTRAVENOUS
  Filled 2019-10-01: qty 1

## 2019-10-01 MED ORDER — CHLORHEXIDINE GLUCONATE CLOTH 2 % EX PADS
6.0000 | MEDICATED_PAD | Freq: Every day | CUTANEOUS | Status: DC
  Administered 2019-10-01 – 2019-10-04 (×3): 6 via TOPICAL

## 2019-10-01 MED ORDER — ACETAMINOPHEN 325 MG PO TABS
650.0000 mg | ORAL_TABLET | Freq: Four times a day (QID) | ORAL | Status: AC | PRN
  Administered 2019-10-01 – 2019-10-02 (×2): 650 mg via ORAL
  Filled 2019-10-01 (×4): qty 2

## 2019-10-01 MED ORDER — FLEET ENEMA 7-19 GM/118ML RE ENEM: 1.0000 | ENEMA | Freq: Once | RECTAL | Status: DC | PRN

## 2019-10-01 MED ORDER — ENALAPRIL MALEATE 5 MG PO TABS
10.0000 mg | ORAL_TABLET | Freq: Every day | ORAL | Status: DC
  Administered 2019-10-02 – 2019-10-05 (×4): 10 mg via ORAL
  Filled 2019-10-01 (×5): qty 2
  Filled 2019-10-01: qty 1

## 2019-10-01 MED ORDER — HYDROCHLOROTHIAZIDE 25 MG PO TABS
25.0000 mg | ORAL_TABLET | Freq: Every day | ORAL | Status: DC
  Administered 2019-10-02 – 2019-10-05 (×4): 25 mg via ORAL
  Filled 2019-10-01 (×5): qty 1

## 2019-10-01 MED ORDER — ZOLPIDEM TARTRATE 5 MG PO TABS
5.0000 mg | ORAL_TABLET | Freq: Every evening | ORAL | Status: DC | PRN
  Administered 2019-10-02 – 2019-10-04 (×3): 5 mg via ORAL
  Filled 2019-10-01 (×3): qty 1

## 2019-10-01 MED ORDER — BISACODYL 10 MG RE SUPP: 10.0000 mg | Freq: Every day | RECTAL | Status: DC | PRN

## 2019-10-01 MED ORDER — ACETAMINOPHEN 650 MG RE SUPP: 650.0000 mg | Freq: Four times a day (QID) | RECTAL | Status: DC | PRN

## 2019-10-01 MED ORDER — LORAZEPAM 2 MG/ML IJ SOLN
1.0000 mg | Freq: Once | INTRAMUSCULAR | Status: AC | PRN
  Administered 2019-10-01: 07:00:00 1 mg via INTRAMUSCULAR
  Filled 2019-10-01: qty 1

## 2019-10-01 MED ORDER — VITAMIN D 25 MCG (1000 UNIT) PO TABS
1000.0000 [IU] | ORAL_TABLET | Freq: Every day | ORAL | Status: DC
  Administered 2019-10-01 – 2019-10-05 (×4): 1000 [IU] via ORAL
  Filled 2019-10-01 (×4): qty 1

## 2019-10-01 NOTE — ED Notes (Signed)
Patient returned from MRI.

## 2019-10-01 NOTE — ED Provider Notes (Signed)
Received signout at the beginning of shift, please see previous providers note for complete H&P.  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 Wynn Banker at [facility] presenting with complaints of right thigh numbness and weakness.  Symptoms ongoing for the past 6 days with increased falls over the last month.  Exam notable for decrease sensation to the medial right thigh compared to left with intact proprioception but exhibits weakness of the right leg.   Worsening weakness while in the ER, having to pick up her leg with her hands  [place] 3 weeks ago.  No incontinence. Steroid shot at [facility] yesterday, no improvement.   8:48 AM Appreciate consultation from neurosurgery and I spoke with Vinnie, neurosurgery PA who will come to ER to evaluate patient and determine disposition.  10:24 AM Pt will be admitted to neurosurgery for surgical intervention.  covid-19 screening test ordered.  Mary Chambers was evaluated in Emergency Department on 10/01/2019 for the symptoms described in the history of present illness. She was evaluated in the context of the global COVID-19 pandemic, which necessitated consideration that the patient might be at risk for infection with the SARS-CoV-2 virus that causes COVID-19. Institutional protocols and algorithms that pertain to the evaluation of patients at risk for COVID-19 are in a state of rapid change based on information released by regulatory bodies including the CDC and federal and state organizations. These policies and algorithms were followed during the patient's care in the ED.   BP (!) 156/105 (BP Location: Right Arm)   Pulse 95   Temp 98.1 F (36.7 C) (Oral)   Resp 17   Ht 5\' 7"  (1.702 m)   Wt 109.8 kg   SpO2 96%   BMI 37.90 kg/m    Results for orders placed or performed during the hospital encounter of 10/01/19  POC urine preg, ED  Result Value Ref Range   Preg Test, Ur NEGATIVE NEGATIVE   DG Lumbar Spine Complete  Result Date: 09/22/2019 CLINICAL DATA:  Pain following fall EXAM: LUMBAR SPINE - COMPLETE 4+ VIEW COMPARISON:  April 17, 2015 FINDINGS: Frontal, lateral, spot lumbosacral lateral, and bilateral oblique views were obtained. There are 5 non rib-bearing lumbar type vertebral bodies. There is lumbar levoscoliosis with rotatory component. Patient has had previous laminectomies at L2 and L3 with posterior screw and plate fixation at L2 and L3. There is also posterior screw and plate fixation at [id number] and T11. Support hardware in these areas appears intact. There is no fracture. There is 4 mm of anterolisthesis of L3 on L4. No other spondylolisthesis evident. There is moderate disc space narrowing at all levels, most notably at L2-3 and L3-4. There is facet osteoarthritic change at L3-4, L4-5, and L5-S1 bilaterally. There is a 3 mm calcification either in or overlying the lower left pelvis. IMPRESSION: Postoperative changes at T10 and T11 as well as at L2 and L3 with support hardware in these areas intact. No fracture evident. Mild spondylolisthesis at L3-4. No other spondylolisthesis. Osteoarthritic change noted at multiple levels. There is a degree of levoscoliosis with rotatory component. Question small calculus lower pole left kidney. Electronically Signed   By: Lowella Grip III M.D.   On: 09/22/2019 12:14   DG Shoulder Right  Result Date: 09/05/2019 CLINICAL DATA:  48 year old female with right shoulder pain.  EXAM: RIGHT SHOULDER - 2+ VIEW COMPARISON:  Chest radiograph dated 09/29/2015. FINDINGS: There is no acute fracture or dislocation. Mild arthritic changes of the right shoulder with subcortical cystic changes of the lateral humeral head and bone spurring from undersurface of the acromion. A 13 mm sclerotic focus  in the proximal right humeral diaphysis is suboptimally characterized on this radiograph, but does not demonstrate aggressive features. Direct comparison with prior images, if available, is recommended. The soft tissues are unremarkable. IMPRESSION: 1. No acute fracture or dislocation. 2. Mild arthritic changes of the right shoulder. Electronically Signed   By: Anner Crete M.D.   On: 09/05/2019 16:22   DG Ankle 2 Views Right  Result Date: 09/05/2019 CLINICAL DATA:  RIGHT ankle pain post fall EXAM: RIGHT ANKLE - 2 VIEW COMPARISON:  None FINDINGS: Osseous mineralization normal. Joint spaces preserved. Plantar and Achilles insertion calcaneal spurs. Scattered soft tissue swelling. No acute fracture, dislocation, or bone destruction. IMPRESSION: No acute osseous abnormalities. Calcaneal spurring. Electronically Signed   By: Lavonia Dana M.D.   On: 09/05/2019 16:26   MR LUMBAR SPINE WO CONTRAST  Result Date: 10/01/2019 CLINICAL DATA:  Low back pain, progressive neurologic deficit. Additional history provided: Status post fall 3 weeks ago, worsening right leg weakness since 3 a.m. today unable to lift right leg, unable to ambulate, numbness right leg. EXAM: MRI LUMBAR SPINE WITHOUT CONTRAST TECHNIQUE: Multiplanar, multisequence MR imaging of the lumbar spine was performed. No intravenous contrast was administered. COMPARISON:  Lumbar spine radiographs 09/22/2019 FINDINGS: Segmentation: Spinal numbering will remain consistent with prior examinations. The lowest well-formed intervertebral disc is designated L5-S1. Alignment: Lumbar levocurvature. Trace T12-L1 and L1-L2 retrolisthesis. Vertebrae: Vertebral body height is maintained. Incompletely imaged posterior spinal fusion construct at T10-T11. Sequela of prior L2-L3 posterior decompression with posterior spinal fusion construct at this level. Susceptibility artifact arising from the fusion hardware slightly limits evaluation of marrow signal at these levels.  No appreciable marrow edema or suspicious osseous lesion. L1 vertebral body hemangioma. Conus medullaris and cauda equina: Conus extends to the T12 level. No signal abnormality within the visualized distal spinal cord. Paraspinal and other soft tissues: No abnormality identified within included portions of the abdomen/retroperitoneum. Postsurgical changes to the paraspinal soft tissues. Marked atrophy of the lower lumbar paraspinal musculature. Disc levels: Mild multilevel disc degeneration. T12-L1: Small disc bulge. Superimposed tiny cranially migrated central disc extrusion. No significant spinal canal stenosis or neural foraminal narrowing. L1-L2: Disc bulge. Superimposed moderate-sized right center/subarticular disc extrusion with mild caudal migration. Facet/ligamentum flavum hypertrophy. Severe spinal canal stenosis with encroachment upon right greater than left descending cauda equina nerve roots. Redundancy of the cauda equina nerve roots cephalad to this level. Mild right neural foraminal narrowing. L2-L3: Sequela of prior posterior decompression and fusion. Disc bulge with endplate spurring. Spinal canal widely patent. No significant foraminal stenosis. L3-L4: Sequela of prior posterior decompression. Small disc bulge with endplate spurring. Facet hypertrophy. Spinal canal widely patent. No significant neural foraminal narrowing. L4-L5: Disc bulge asymmetric to the left with endplate spurring. Moderate facet arthrosis with ligamentum flavum hypertrophy. 8 mm posteriorly projecting synovial facet cyst on the left. Moderate left subarticular stenosis with crowding of the descending left L5 nerve root. Mild relative central canal narrowing. Mild/moderate left neural foraminal narrowing. L5-S1: Small disc bulge. Mild facet arthrosis/ligamentum flavum hypertrophy. No significant spinal canal stenosis or neural foraminal narrowing. These results were called by telephone at the time of interpretation on  10/01/2019 at 7:59 am to provider Dr. Rona Ravens, who  verbally acknowledged these results. IMPRESSION: Spinal numbering will remain consistent with previous examinations. Sequela of prior posterior decompression at L2-L3 and L3-L4 with L2-L3 posterior spinal fusion construct. Spinal canal widely patent at these levels. Incompletely imaged T10-T11 posterior spinal fusion construct. At L1-L2, a moderate-sized right center/subarticular caudally migrated disc extrusion contributes to severe spinal canal stenosis with encroachment upon right greater than left cauda equina nerve roots. There is redundancy of the cauda equina nerve roots cephalad to this level. Mild right neural foraminal narrowing. At L4-L5, multifactorial left subarticular stenosis with crowding of the descending left L5 nerve root. Mild central canal narrowing and mild/moderate left neural foraminal narrowing also present at this level. Electronically Signed   By: Kellie Simmering DO   On: 10/01/2019 07:59   DG Knee Complete 4 Views Right  Result Date: 09/05/2019 CLINICAL DATA:  48 year old female with fall and right knee pain. EXAM: RIGHT KNEE - COMPLETE 4+ VIEW COMPARISON:  None. FINDINGS: There is no acute fracture or dislocation. Mild osteopenia and mild arthritic changes with narrowing of the patellofemoral and medial compartment and bone spurring. No significant joint effusion. The soft tissues are unremarkable. IMPRESSION: 1. No acute fracture or dislocation. 2. Mild arthritic changes. Electronically Signed   By: Anner Crete M.D.   On: 09/05/2019 16:24   DG Hip Unilat  With Pelvis 2-3 Views Right  Result Date: 09/22/2019 CLINICAL DATA:  Pain EXAM: DG HIP (WITH OR WITHOUT PELVIS) 2-3V RIGHT COMPARISON:  None. FINDINGS: Frontal pelvis as well as frontal and lateral right hip images were obtained. No fracture or dislocation. There is moderate symmetric narrowing of each hip joint. No erosive change. Sacroiliac joints bilaterally appear normal.  There is degenerative change on the left at L4-5. IMPRESSION: Symmetric narrowing of each hip joint. No fracture or dislocation. Osteoarthritic change noted in the lower lumbar region. Electronically Signed   By: Lowella Grip III M.D.   On: 09/22/2019 12:11   DG Hip Unilat  With Pelvis 2-3 Views Right  Result Date: 09/05/2019 CLINICAL DATA:  RIGHT hip pain post fall EXAM: DG HIP (WITH OR WITHOUT PELVIS) 2-3V RIGHT COMPARISON:  07/20/2014 FINDINGS: Osseous mineralization normal. Mild degenerative changes of the RIGHT hip joint with joint space narrowing and spurring. Minimal narrowing of LEFT hip joint. SI joints preserved. No acute fracture, dislocation, or bone destruction. Degenerative disc disease changes L4-L5. Prior fusion L2-L3. IMPRESSION: Degenerative changes of RIGHT hip and at L4-L5. No acute abnormalities. Electronically Signed   By: Lavonia Dana M.D.   On: 09/05/2019 16:24      Domenic Moras, PA-C 10/01/19 1025    Tegeler, [name], MD 10/01/19 [phone]

## 2019-10-01 NOTE — ED Provider Notes (Addendum)
[facility] DEPT Provider Note   CSN: [id number] Arrival date & time: 10/01/19  [id number]     History Chief Complaint  Patient presents with  . Numbness    right thigh     Mary Chambers is a 48 y.o. 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  The patient reports that she developed numbness in her right anteromedial thigh that has been persistent since onset over the last 6 days (12/24).  She reports that she has also been having intermittent weakness/heaviness in her right thigh during this time.  She works in transportation and reports that she has been having to use her arms to pull up her right leg to move it on and off of the the gas pedal and brake.  She reports that sometimes when she stands she will also have to pick up the leg because it is feeling weak and heavy.  She states that it feels as if there is a band or a "hair tie" wrapped around the top of her right thigh. she reports that she has also been having increasing falls over the last few weeks, most recently where she fell backwards on 12/25.  Earlier this month, she was seen for right-sided low back pain that was shooting down into her right hip and leg.  She had an x-ray in the ER that was unremarkable she was discharged with a prednisone Dosepak and pain medication.  She reports that that pain has since resolved and the symptoms that she has been having have been virtually pain-free.  Reports that Tylenol and Aleve have been controlling her symptoms as she has developed some mild left-sided low back pain from compensating from the symptoms on her right side.  No known aggravating or alleviating factors.  She denies fevers,  chills, headaches, visual changes, neck pain, slurred speech, urinary or fecal incontinence, saddle paresthesias, left leg numbness or weakness, abdominal pain, nausea, vomiting, diarrhea, constipation, hematuria, dysuria, vaginal pain or discharge.  No hip, knee, or ankle pain.  She has been able to ambulate, but gets concerned about the heaviness in her leg.  In addition to the times where she has fallen over the last few weeks, she reports that she has had to catch herself several times to keep from falling.   No history of IV drug use.  Followed by Dr. Wynn Banker with neurosurgery at [facility].  The history is provided by the patient. No language interpreter was used.       Past Medical History:  Diagnosis Date  . Hypertension   . Kidney stone     Patient Active Problem List   Diagnosis Date Noted  . Pain in left foot 03/29/2018  . Arthritis 02/13/2018  . Chronic back pain 02/13/2018  . Migraines 02/13/2018  . Morbid obesity with BMI of 40.0-44.9, adult ([place]) 02/13/2018  . Synovial cyst of lumbar spine 10/15/2017  . Osteoarthritis of knee 07/13/2017  . Lumbar disc disease with radiculopathy 08/01/2016  . Fatigue 05/04/2016  . Headache 05/04/2016  . Numbness of foot 05/04/2016  . 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 Arthrodesis status 02/02/2016  . Chronic pain syndrome 02/02/2016  . Elevated white blood cell count 12/01/2015  . Lumbar stenosis with neurogenic claudication 05/07/2015  . Bilateral leg  edema 04/21/2015  . Carpal tunnel syndrome of right wrist 12/14/2014  . Renal atrophy, left 01/23/2014  . Achilles tendonitis 01/16/2014  . Right lower quadrant pain 12/18/2013  . Hypertension, benign 06/03/2010  . Urinary incontinence 06/03/2010  . Cervical disc disease 09/09/2009  . 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 ABDOMINAL HYSTERECTOMY    . BACK SURGERY    . CARPAL TUNNEL RELEASE    . CHOLECYSTECTOMY    . LITHOTRIPSY    .  neck fusion    . TUBAL LIGATION       OB History   No obstetric history on file.     Family History  Problem Relation Age of Onset  . Diabetes Mother   . Hypertension Mother   . Cancer Father   . Parkinson's disease Father     Social History   Tobacco Use  . Smoking status: Never Smoker  . Smokeless tobacco: Never Used  Substance Use Topics  . Alcohol use: No  . Drug use: Never    Home Medications Prior to Admission medications   Medication Sig Start Date End Date Taking? Authorizing Provider  Ascorbic Acid (VITAMIN C) 500 MG CAPS Take 500 mg by mouth daily.   Yes [provider]  cholecalciferol (VITAMIN D3) 25 MCG (1000 UT) tablet Take 1,000 Units by mouth daily.   Yes [provider]  enalapril (VASOTEC) 10 MG tablet Take 10 mg by mouth daily.   Yes [provider]  hydrochlorothiazide (HYDRODIURIL) 25 MG tablet Take 25 mg by mouth daily.   Yes [provider]  Multiple Vitamin (MULTIVITAMIN) capsule Take 1 capsule by mouth daily.    Yes [provider]  naproxen sodium (ALEVE) 220 MG tablet Take 220 mg by mouth 2 (two) times daily as needed (pain).   Yes [provider]  potassium chloride SA (K-DUR,KLOR-CON) 20 MEQ tablet Take 20 mEq by mouth 2 (two) times daily.   Yes [provider]  tiZANidine (ZANAFLEX) 4 MG tablet Take 1 tablet (4 mg total) by mouth every 6 (six) hours as needed for muscle spasms. 07/20/19  Yes Yu, Amy V, PA-C  cetirizine (ZYRTEC) 10 MG chewable tablet Chew 1 tablet (10 mg total) by mouth daily. 05/06/19 06/05/19  Sharion Balloon, NP  fluticasone (FLONASE) 50 MCG/ACT nasal spray Place 2 sprays into both nostrils daily. Patient not taking: Reported on 10/01/2019 09/08/19   Ok Edwards, PA-C  HYDROcodone-acetaminophen (NORCO/VICODIN) 5-325 MG tablet Take 1 tablet by mouth every 4 (four) hours as needed. Patient not taking: Reported on 10/01/2019 09/22/19   Henderly, Britni A, PA-C    diphenhydrAMINE (BENADRYL) 25 mg capsule Take by mouth. 03/10/16 06/21/19  [provider]  LYRICA 50 MG capsule Take 50 mg by mouth 3 (three) times daily. 04/15/18 06/07/19  [provider]    Allergies    Baclofen, Doxycycline, Percocet [oxycodone-acetaminophen], Septra [sulfamethoxazole-trimethoprim], and Gabapentin  Review of Systems   Review of Systems  Constitutional: Negative for activity change, chills and fever.  HENT: Negative for congestion and sore throat.   Eyes: Negative for visual disturbance.  Respiratory: Negative for shortness of breath and wheezing.   Cardiovascular: Negative for chest pain and palpitations.  Gastrointestinal: Negative for abdominal pain, blood in stool, diarrhea, nausea and vomiting.  Genitourinary: Negative for dysuria, flank pain, frequency, urgency, vaginal bleeding and vaginal discharge.  Musculoskeletal: Positive for gait problem. Negative for back pain, joint swelling, neck pain  and neck stiffness.  Skin: Negative for rash.  Allergic/Immunologic: Negative for immunocompromised state.  Neurological: Positive for weakness and numbness. Negative for dizziness, syncope, speech difficulty, light-headedness and headaches.  Psychiatric/Behavioral: Negative for confusion.    Physical Exam Updated Vital Signs BP 116/87 (BP Location: Left Wrist)   Pulse 68   Temp 98.1 F (36.7 C) (Oral)   Resp 18   Ht 5\' 7"  (1.702 m)   Wt 109.8 kg   SpO2 95%   BMI 37.90 kg/m   Physical Exam Vitals and nursing note reviewed.  Constitutional:      General: She is not in acute distress.    Appearance: She is obese. She is not ill-appearing, toxic-appearing or diaphoretic.     Comments: No acute distress.  HENT:     Head: Normocephalic.  Eyes:     Conjunctiva/sclera: Conjunctivae normal.  Cardiovascular:     Rate and Rhythm: Normal rate and regular rhythm.     Heart sounds: No murmur. No friction rub. No gallop.   Pulmonary:     Effort:  Pulmonary effort is normal. No respiratory distress.     Breath sounds: No stridor. No wheezing, rhonchi or rales.  Chest:     Chest wall: No tenderness.  Abdominal:     General: There is no distension.     Palpations: Abdomen is soft. There is no mass.     Tenderness: There is no abdominal tenderness. There is no right CVA tenderness, left CVA tenderness, guarding or rebound.     Hernia: No hernia is present.  Musculoskeletal:     Cervical back: Neck supple.     Comments: Well-healed midline scar to the lumbar spine.  No focal tenderness to the cervical, thoracic, or lumbar spinous processes or bilateral paraspinal muscles.  Negative straight leg raise bilaterally.  Skin:    General: Skin is warm.     Findings: No rash.  Neurological:     Mental Status: She is alert.     Comments: Decreased sensation to sharp touch that is most prominent to the anterior and medial aspect of the right thigh, but is present throughout the entire right lower extremity when compared to the left lower extremity.  Proprioception of the bilateral lower extremities is intact.  Patient is able to lift the left leg off the bed.  She is unable to lift the right leg without using her arms for assistance.  She is able to ambulate.  She is not obviously dragging the right leg and there is no obvious weakness which short distances on my exam.  No foot drop.  No other focal neurologic deficits.  Psychiatric:        Behavior: Behavior normal.     ED Results / Procedures / Treatments   Labs (all labs ordered are listed, but only abnormal results are displayed) Labs Reviewed - No data to display  EKG None  Radiology No results found.  Procedures Procedures (including critical care time)  Medications Ordered in ED Medications  acetaminophen (TYLENOL) tablet 650 mg (has no administration in time range)  LORazepam (ATIVAN) injection 1 mg (has no administration in time range)    ED Course  I have reviewed the  triage vital signs and the nursing notes.  Pertinent labs & imaging results that were available during my care of the patient were reviewed by me and considered in my medical decision making (see chart for details).    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  She has also been having increased falls over the last month.  She has a history of previous lumbar spine fusion.  No constitutional symptoms.  No history of IV drug use.  She is not having any associated pain at this time other than some mild left-sided pain due to positional changes from compensating for her symptoms on the right.  The patient was discussed with Dr. Dayna Barker, attending physician.  She does have decreased sensation to sharp touch on the right when compared to the left.  This is most predominantly seen in the right anteromedial thigh, but appears to extend throughout the right lower extremity.  Proprioception seems intact.  She is able to ambulate, and I do not see an obvious foot drop or weakness in the leg with ambulation of short distances.  However, given her medical history with acute deficits, I think she warrants an MRI of the lumbar spine while she is in the ER.  This has been ordered and is pending. A dose of PRN Ativan ordered for prior to the study.  She has not required any pain medication at this time, but as needed Tylenol has been ordered.  Low suspicion for infectious etiology, including epidural abscess at this time.  Patient care transferred to [facility] at the end of my shift to follow up on MRI. Patient presentation, ED course, and plan of care  discussed with review of all pertinent labs and imaging. Please see his/her note for further details regarding further ED course and disposition.  Final Clinical Impression(s) / ED Diagnoses Final diagnoses:  None    Rx / DC Orders ED Discharge Orders    None       Ilma Achee, [place], PA-C 10/01/19 0630    Osker Ayoub A, PA-C 10/01/19 [id number]    Merrily Pew, MD 10/01/19 [phone]

## 2019-10-01 NOTE — ED Notes (Signed)
Patient transported to MRI 

## 2019-10-01 NOTE — ED Notes (Signed)
Patient off floor in MRI. Belongings at bedside.

## 2019-10-01 NOTE — ED Triage Notes (Signed)
Pt coming in c/o right thigh numbness since 09/25/19. Seen at [facility] 12/29 and told to come to ED if not better in 12 hours and seen in ED 09/22/19 and dx with sciatica.  Reports 2 falls since then. Pt stated there feels like a band is wrapped around her right hip area. Denies pain. Pt has to pick up leg to move it. No facial droop, blurry vision or slurred speech

## 2019-10-01 NOTE — ED Notes (Signed)
[facility] has been dispatched with ETA-1 hour or so.   Report given to Caryl Pina, RN [id number].

## 2019-10-01 NOTE — ED Notes (Signed)
[facility] at bedside 

## 2019-10-01 NOTE — H&P (Addendum)
Chief Complaint   Chief Complaint  Patient presents with  . Numbness    right thigh     HPI   HPI: Mary Chambers is a 48 y.o. female with history of HTN, cervical DDD, thoracic myelopathy and lumbar DDD s/p multiple cervical, thoracic and lumbar surgeries by Dr Aleene Davidson, who presents to the ED for the third time in the past three weeks for right leg pain & worsening numbness in RLE. 3 weeks ago, she had a mechanical fall, suffered bruising to right knee and shin. Evaluated at ED and work up negative for acute fracture. On 12/21 she developed rather severe right lower back and hip pain and numbness in right thigh. She again presented to ED and was prescribed norco and prednisone. Unfortunately this did not provide relief with her pain/paresthesias. Six days ago, she started to notice weakness in right leg proximally with HF and KE. She was seen by her PCP 2 days ago for the pain and weakness and was given what sounds like an IM steroid. She reports complete resolution of pain with injection, however the strength and numbness in her right leg has progressively worsened. She has been unable to lift right leg without using her BUE. She has fallen multiple times due to the weakness in her leg. She ultimately came to [facility] ER because of the weakness and numbness. An MRI was obtained which revealed a large disc herniation at L1-2, adjacent to prior L2-3 fusion. NSY consultation was requested.    She continues to deny any pain. Does endorse progressive weakness proximal RLE even since being in the ED. Numbness in anterolateral thigh. No LLE symptoms. Chronic urge incontinence, but denies any worsening. Denies urinary symptoms, although UA obtained in ED + for nitrites. She has been trying to get in to be seen by Dr Wynn Banker who has performed all of her other cervical, thoracic and lumbar surgeries. Unfortunately the soonest she can be seen is January 25th.   Not on blood thinners. Has not eaten since  yesterday evening.   Patient Active Problem List   Diagnosis Date Noted  . Pain in left foot 03/29/2018  . Arthritis 02/13/2018  . Chronic back pain 02/13/2018  . Migraines 02/13/2018  . Morbid obesity with BMI of 40.0-44.9, adult ([place]) 02/13/2018  . Synovial cyst of lumbar spine 10/15/2017  . Osteoarthritis of knee 07/13/2017  . Lumbar disc disease with radiculopathy 08/01/2016  . Fatigue 05/04/2016  . Headache 05/04/2016  . Numbness of foot 05/04/2016  . 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 Arthrodesis status 02/02/2016  . Chronic pain syndrome 02/02/2016  . Elevated white blood cell count 12/01/2015  . Lumbar stenosis with neurogenic claudication 05/07/2015  . Bilateral leg edema 04/21/2015  . Carpal tunnel syndrome of right wrist 12/14/2014  . Renal atrophy, left 01/23/2014  . Achilles tendonitis 01/16/2014  . Right lower quadrant pain 12/18/2013  . Hypertension, benign 06/03/2010  . Urinary incontinence 06/03/2010  . Cervical disc disease 09/09/2009  . 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 Hypertension   . Kidney stone     PSH: Past Surgical History:  Procedure Laterality Date  . ABDOMINAL HYSTERECTOMY    . BACK SURGERY    . CARPAL TUNNEL RELEASE    . CHOLECYSTECTOMY    . LITHOTRIPSY    . neck fusion    . TUBAL LIGATION      (Not in a hospital admission)   SH: Social  History   Tobacco Use  . Smoking status: Never Smoker  . Smokeless tobacco: Never Used  Substance Use Topics  . Alcohol use: No  . Drug use: Never    MEDS: Prior to Admission medications   Medication Sig Start Date End Date Taking? Authorizing Provider  Ascorbic Acid (VITAMIN C) 500 MG CAPS Take 500 mg by mouth daily.   Yes [provider]  cholecalciferol (VITAMIN D3) 25 MCG (1000 UT) tablet Take 1,000 Units by mouth daily.   Yes [provider]  enalapril (VASOTEC) 10 MG tablet Take 10 mg by mouth daily.   Yes [provider]  hydrochlorothiazide (HYDRODIURIL) 25 MG tablet Take 25 mg by mouth daily.   Yes [provider]  Multiple Vitamin (MULTIVITAMIN) capsule Take 1 capsule by mouth daily.    Yes [provider]  naproxen sodium (ALEVE) 220 MG tablet Take 220 mg by mouth 2 (two) times daily as needed (pain).   Yes [provider]  potassium chloride SA (K-DUR,KLOR-CON) 20 MEQ tablet Take 20 mEq by mouth 2 (two) times daily.   Yes [provider]  tiZANidine (ZANAFLEX) 4 MG tablet Take 1 tablet (4 mg total) by mouth every 6 (six) hours as needed for muscle spasms. 07/20/19  Yes Yu, Amy V, PA-C  cetirizine (ZYRTEC) 10 MG chewable tablet Chew 1 tablet (10 mg total) by mouth daily. 05/06/19 06/05/19  Sharion Balloon, NP  fluticasone (FLONASE) 50 MCG/ACT nasal spray Place 2 sprays into both nostrils daily. Patient not taking: Reported on 10/01/2019 09/08/19   Ok Edwards, PA-C  HYDROcodone-acetaminophen (NORCO/VICODIN) 5-325 MG tablet Take 1 tablet by mouth every 4 (four) hours as needed. Patient not taking: Reported on 10/01/2019 09/22/19   Henderly, Britni A, PA-C  diphenhydrAMINE (BENADRYL) 25 mg capsule Take by mouth. 03/10/16 06/21/19  [provider]  LYRICA 50 MG capsule Take 50 mg by mouth 3 (three) times daily. 04/15/18 06/07/19  [provider]    ALLERGY: Allergies  Allergen Reactions  . Baclofen Other (See Comments)  . Doxycycline Itching  . Percocet [Oxycodone-Acetaminophen] Hives  . Septra [Sulfamethoxazole-Trimethoprim] Hives  . Gabapentin Rash    Social History   Tobacco Use  . Smoking status: Never Smoker  . Smokeless tobacco: Never Used  Substance Use Topics  . Alcohol use: No     Family History  Problem Relation Age of Onset  . Diabetes Mother   . Hypertension Mother   . Cancer Father   . Parkinson's disease Father      ROS   Review of Systems  Constitutional: Negative.   HENT: Negative.   Eyes: Negative.   Respiratory:  Negative.   Cardiovascular: Negative.   Gastrointestinal: Negative.  Negative for nausea.  Genitourinary: Negative.   Musculoskeletal: Positive for falls. Negative for back pain, myalgias and neck pain.  Skin: Negative.   Neurological: Positive for tingling, focal weakness and weakness. Negative for dizziness, tremors, sensory change, speech change, seizures, loss of consciousness and headaches.    Exam   Vitals:   10/01/19 0930 10/01/19 0952  BP: (!) 142/83   Pulse: 94 94  Resp: 19   Temp:    SpO2: 97% 97%   General appearance: WDWN, NAD Eyes: No scleral injection Cardiovascular: Regular rate and rhythm without murmurs, rubs, gallops. No edema or variciosities. Distal pulses normal. Pulmonary: Effort normal, non-labored breathing Musculoskeletal:     Muscle tone upper extremities: Normal    Muscle tone lower extremities: Normal  Motor exam: Upper Extremities Deltoid Bicep Tricep Grip  Right 5/5 5/5 5/5 5/5  Left 5/5 5/5 5/5 5/5   Lower Extremity IP Quad PF DF EHL  Right 2/5 2/5 4/5 4/5 4/5  Left 5/5 5/5 5/5 5/5 5/5   Neurological Mental Status:    - Patient is awake, alert, oriented to person, place, month, year, and situation    - Patient is able to give a clear and coherent history.    - No signs of aphasia or neglect Cranial Nerves    - II: Visual Fields are full. PERRL    - III/IV/VI: EOMI without ptosis or diploplia.     - V: Facial sensation is grossly normal    - VII: Facial movement is symmetric.     - VIII: hearing is intact to voice    - X: Uvula elevates symmetrically    - XI: Shoulder shrug is symmetric.    - XII: tongue is midline without atrophy or fasciculations.  Sensory: Sensation grossly intact to LT Deep Tendon Reflexes    - decreased lower reflexes Plantars   - Toes are downgoing bilaterally.   Results - Imaging/Labs   Results for orders placed or performed during the hospital encounter of 10/01/19 (from the past 48 hour(s))    Urinalysis, Routine w reflex microscopic     Status: Abnormal   Collection Time: 10/01/19  8:29 AM  Result Value Ref Range   Color, Urine YELLOW YELLOW   APPearance CLEAR CLEAR   Specific Gravity, Urine 1.013 1.005 - 1.030   pH 6.0 5.0 - 8.0   Glucose, UA NEGATIVE NEGATIVE mg/dL   Hgb urine dipstick NEGATIVE NEGATIVE   Bilirubin Urine NEGATIVE NEGATIVE   Ketones, ur NEGATIVE NEGATIVE mg/dL   Protein, ur NEGATIVE NEGATIVE mg/dL   Nitrite POSITIVE (A) NEGATIVE   Leukocytes,Ua TRACE (A) NEGATIVE   RBC / HPF 0-5 0 - 5 RBC/hpf   WBC, UA 6-10 0 - 5 WBC/hpf   Bacteria, UA RARE (A) NONE SEEN   Mucus PRESENT     Comment: Performed at [facility], [place] [address], [place], [place] [address]  POC urine preg, ED     Status: None   Collection Time: 10/01/19  8:35 AM  Result Value Ref Range   Preg Test, Ur NEGATIVE NEGATIVE    Comment:        THE SENSITIVITY OF THIS METHODOLOGY IS >24 mIU/mL   Basic metabolic panel     Status: Abnormal   Collection Time: 10/01/19  8:44 AM  Result Value Ref Range   Sodium 137 135 - 145 mmol/L   Potassium 4.2 3.5 - 5.1 mmol/L   Chloride 104 98 - 111 mmol/L   CO2 22 22 - 32 mmol/L   Glucose, Bld 129 (H) 70 - 99 mg/dL   BUN 19 6 - 20 mg/dL   Creatinine, Ser 0.76 0.44 - 1.00 mg/dL   Calcium 9.0 8.9 - 10.3 mg/dL   GFR calc non Af Amer >60 >60 mL/min   GFR calc Af Amer >60 >60 mL/min   Anion gap 11 5 - 15    Comment: Performed at [facility], [place] [address], [place], [place] [address]  CBC with Differential     Status: Abnormal   Collection Time: 10/01/19  8:44 AM  Result Value Ref Range   WBC 14.5 (H) 4.0 - 10.5 K/uL   RBC 4.95 3.87 - 5.11 MIL/uL   Hemoglobin 13.9 12.0 - 15.0  g/dL   HCT 44.0 36.0 - 46.0 %   MCV 88.9 80.0 - 100.0 fL   MCH 28.1 26.0 - 34.0 pg   MCHC 31.6 30.0 - 36.0 g/dL   RDW 14.2 11.5 - 15.5 %   Platelets 336 150 - 400 K/uL   nRBC 0.0 0.0 - 0.2 %   Neutrophils Relative % 88 %   Neutro Abs  12.7 (H) 1.7 - 7.7 K/uL   Lymphocytes Relative 10 %   Lymphs Abs 1.5 0.7 - 4.0 K/uL   Monocytes Relative 1 %   Monocytes Absolute 0.2 0.1 - 1.0 K/uL   Eosinophils Relative 0 %   Eosinophils Absolute 0.0 0.0 - 0.5 K/uL   Basophils Relative 0 %   Basophils Absolute 0.0 0.0 - 0.1 K/uL   Immature Granulocytes 1 %   Abs Immature Granulocytes 0.11 (H) 0.00 - 0.07 K/uL    Comment: Performed at [facility], [place] [address], [place], [place] [address]  POC SARS Coronavirus 2 Ag-ED - Nasal Swab (BD Veritor Kit)     Status: None   Collection Time: 10/01/19  9:07 AM  Result Value Ref Range   SARS Coronavirus 2 Ag NEGATIVE NEGATIVE    Comment: (NOTE) SARS-CoV-2 antigen NOT DETECTED.  Negative results are presumptive.  Negative results do not preclude SARS-CoV-2 infection and should not be used as the sole basis for treatment or other patient management decisions, including infection  control decisions, particularly in the presence of clinical signs and  symptoms consistent with COVID-19, or in those who have been in contact with the virus.  Negative results must be combined with clinical observations, patient history, and epidemiological information. The expected result is Negative. Fact Sheet for Patients: PodPark.tn Fact Sheet for Healthcare Providers: GiftContent.is This test is not yet approved or cleared by the [place] FDA and  has been authorized for detection and/or diagnosis of SARS-CoV-2 by FDA under an Emergency Use Authorization (EUA).  This EUA will remain in effect (meaning this test can be used) for the duration of  the COVID-19 de claration under Section 564(b)(1) of the Act, 21 U.S.C. section 360bbb-3(b)(1), unless the authorization is terminated or revoked sooner.     MR LUMBAR SPINE WO CONTRAST  Result Date: 10/01/2019 CLINICAL DATA:  Low back pain, progressive neurologic deficit.  Additional history provided: Status post fall 3 weeks ago, worsening right leg weakness since 3 a.m. today unable to lift right leg, unable to ambulate, numbness right leg. EXAM: MRI LUMBAR SPINE WITHOUT CONTRAST TECHNIQUE: Multiplanar, multisequence MR imaging of the lumbar spine was performed. No intravenous contrast was administered. COMPARISON:  Lumbar spine radiographs 09/22/2019 FINDINGS: Segmentation: Spinal numbering will remain consistent with prior examinations. The lowest well-formed intervertebral disc is designated L5-S1. Alignment: Lumbar levocurvature. Trace T12-L1 and L1-L2 retrolisthesis. Vertebrae: Vertebral body height is maintained. Incompletely imaged posterior spinal fusion construct at T10-T11. Sequela of prior L2-L3 posterior decompression with posterior spinal fusion construct at this level. Susceptibility artifact arising from the fusion hardware slightly limits evaluation of marrow signal at these levels. No appreciable marrow edema or suspicious osseous lesion. L1 vertebral body hemangioma. Conus medullaris and cauda equina: Conus extends to the T12 level. No signal abnormality within the visualized distal spinal cord. Paraspinal and other soft tissues: No abnormality identified within included portions of the abdomen/retroperitoneum. Postsurgical changes to the paraspinal soft tissues. Marked atrophy of the lower lumbar paraspinal musculature. Disc levels: Mild multilevel disc degeneration. T12-L1: Small disc bulge. Superimposed tiny cranially  migrated central disc extrusion. No significant spinal canal stenosis or neural foraminal narrowing. L1-L2: Disc bulge. Superimposed moderate-sized right center/subarticular disc extrusion with mild caudal migration. Facet/ligamentum flavum hypertrophy. Severe spinal canal stenosis with encroachment upon right greater than left descending cauda equina nerve roots. Redundancy of the cauda equina nerve roots cephalad to this level. Mild right neural  foraminal narrowing. L2-L3: Sequela of prior posterior decompression and fusion. Disc bulge with endplate spurring. Spinal canal widely patent. No significant foraminal stenosis. L3-L4: Sequela of prior posterior decompression. Small disc bulge with endplate spurring. Facet hypertrophy. Spinal canal widely patent. No significant neural foraminal narrowing. L4-L5: Disc bulge asymmetric to the left with endplate spurring. Moderate facet arthrosis with ligamentum flavum hypertrophy. 8 mm posteriorly projecting synovial facet cyst on the left. Moderate left subarticular stenosis with crowding of the descending left L5 nerve root. Mild relative central canal narrowing. Mild/moderate left neural foraminal narrowing. L5-S1: Small disc bulge. Mild facet arthrosis/ligamentum flavum hypertrophy. No significant spinal canal stenosis or neural foraminal narrowing. These results were called by telephone at the time of interpretation on 10/01/2019 at 7:59 am to provider Dr. Rona Ravens, who verbally acknowledged these results. IMPRESSION: Spinal numbering will remain consistent with previous examinations. Sequela of prior posterior decompression at L2-L3 and L3-L4 with L2-L3 posterior spinal fusion construct. Spinal canal widely patent at these levels. Incompletely imaged T10-T11 posterior spinal fusion construct. At L1-L2, a moderate-sized right center/subarticular caudally migrated disc extrusion contributes to severe spinal canal stenosis with encroachment upon right greater than left cauda equina nerve roots. There is redundancy of the cauda equina nerve roots cephalad to this level. Mild right neural foraminal narrowing. At L4-L5, multifactorial left subarticular stenosis with crowding of the descending left L5 nerve root. Mild central canal narrowing and mild/moderate left neural foraminal narrowing also present at this level. Electronically Signed   By: Kellie Simmering DO   On: 10/01/2019 07:59    Impression/Plan   48 y.o.  female with progressive weakness and numbness proximal RLE secondary to large L1-2 disc herniation adjacent to prior L2-3 fusion. She has proximal RLE weakness with IP and quad, 2/5 at best. Chronic distal RLE weakness essentially stable.   She is followed by Dr Wynn Banker, spine surgeon, with [facility]. Given her extensive history of spine surgery with him (cervical, thoracic and lumbar), I did advise her that it may be best for continuity of care to f/u with Dr Wynn Banker. I called Dr Glendell Docker office, and unfortunately, he is out of town until Monday. Given progressive weakness with a large herniation, multiple ER visits over the past three weeks, we rec intervention sooner rather than later. I discussed with her the necessary surgery which includes decompression at L1-2 with extension of prior fusion. I have discussed risks, benefits including alternatives for outpt f/u with Dr Wynn Banker when he returns. Given severity, she appreciates surgical intervention with Dr Vertell Limber today, rather than waiting for Dr Glendell Docker return.   Patient to be transferred to [facility] for surgery. Keep NPO. Dr Vertell Limber to f/u when she arrives. Chart review reveals prior [employer] Solera hardware.   UTI - Denies sx but nitrite positive. Send for culture - start po keflex TID po x7days. Follow culture results - Okay for foley in preparation for surgery tomorrow  Addendum Patient's surgery will be rescheduled to tomorrow. She has been updated regarding plan.   Ferne Reus, PA-C [facility] [employer]

## 2019-10-01 NOTE — Progress Notes (Signed)
Orthopedic Tech Progress Note Patient Details:  [name] Apr 21, 1971 [id number] Called in order to [facility] for a LUMBAR CORSETT.  Patient ID: Mary Chambers, female   DOB: 1971-01-17, 48 y.o.   MRN: [id number]   Janit Pagan 10/01/2019, 4:21 PM

## 2019-10-02 ENCOUNTER — Inpatient Hospital Stay (HOSPITAL_COMMUNITY): Payer: Medicaid Other

## 2019-10-02 ENCOUNTER — Encounter (HOSPITAL_COMMUNITY): Payer: Self-pay | Admitting: Neurosurgery

## 2019-10-02 ENCOUNTER — Inpatient Hospital Stay (HOSPITAL_COMMUNITY)
Admission: EM | Disposition: A | Discharge status: Home Health | Payer: Self-pay | Source: Home / Self Care | Attending: Neurosurgery

## 2019-10-02 ENCOUNTER — Inpatient Hospital Stay (HOSPITAL_COMMUNITY): Payer: Medicaid Other | Admitting: Anesthesiology

## 2019-10-02 DIAGNOSIS — M5126 Other intervertebral disc displacement, lumbar region: Secondary | ICD-10-CM | POA: Diagnosis present

## 2019-10-02 HISTORY — DX: Other intervertebral disc displacement, lumbar region: M51.26

## 2019-10-02 IMAGING — RF DG LUMBAR SPINE 2-3V
1 series · 3 of 3 positions shown · non-contrast
Comparison: MRI [DATE].

CLINICAL DATA: Surgical posterior fusion of lumbar spine.

EXAM:
LUMBAR SPINE - 2-3 VIEW; DG C-ARM 1-60 MIN
FLUOROSCOPY TIME:  37 seconds.

[Series 1: run · 3 of 3 slices shown]
[im 1/3]
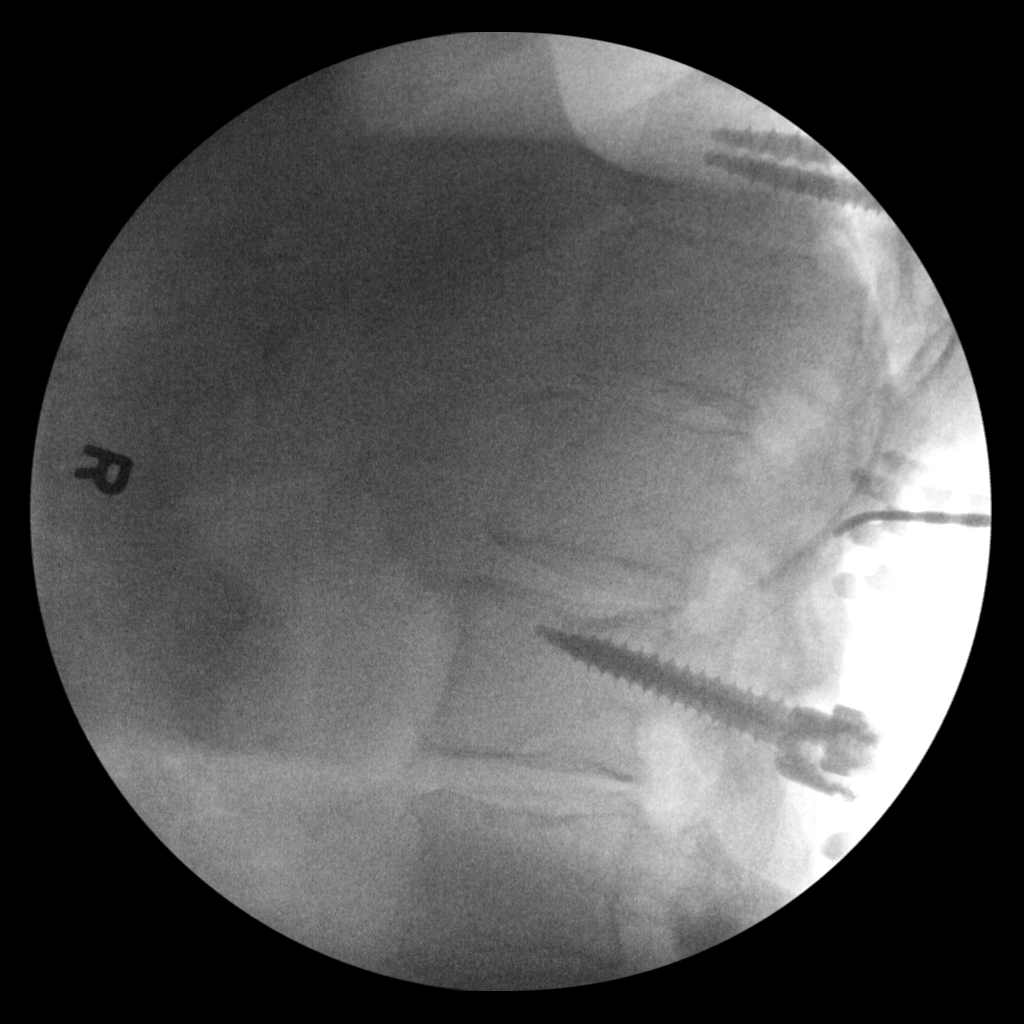
[im 2/3]
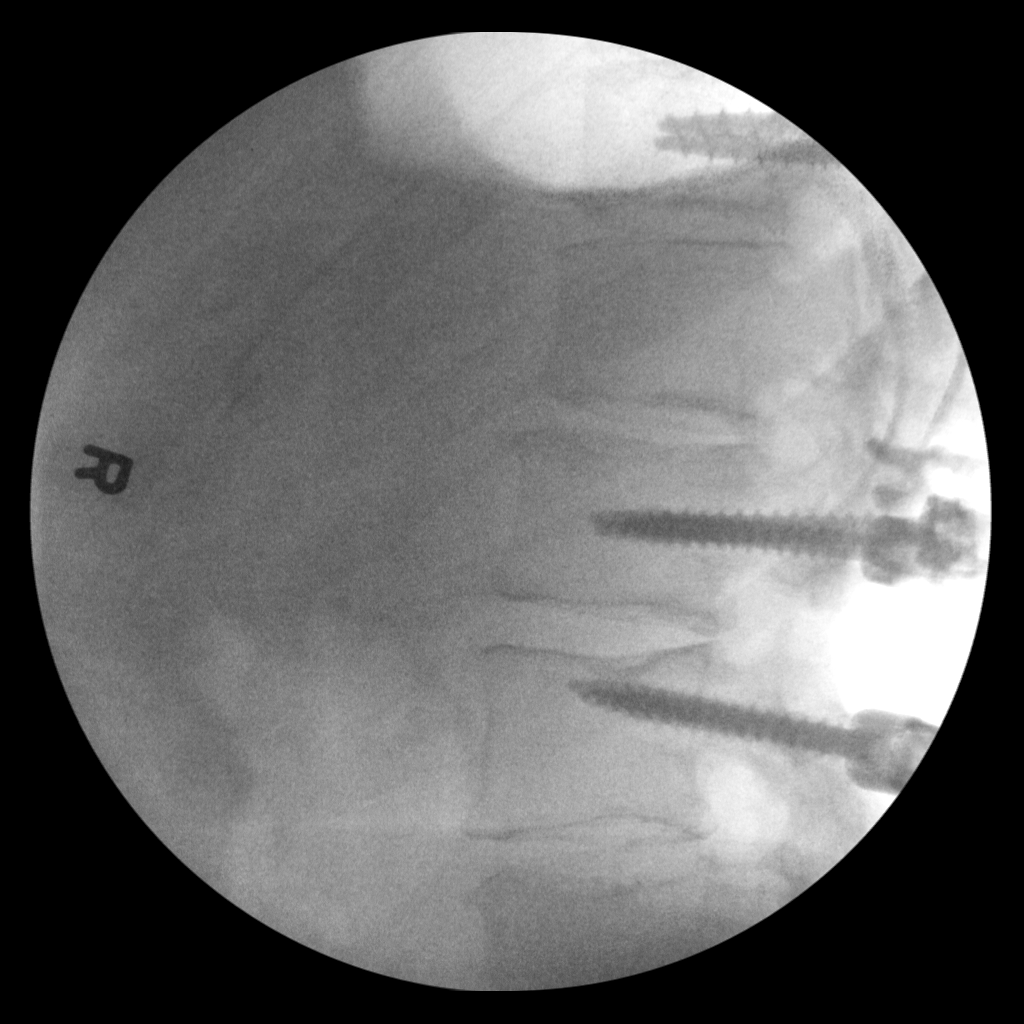
[im 3/3]
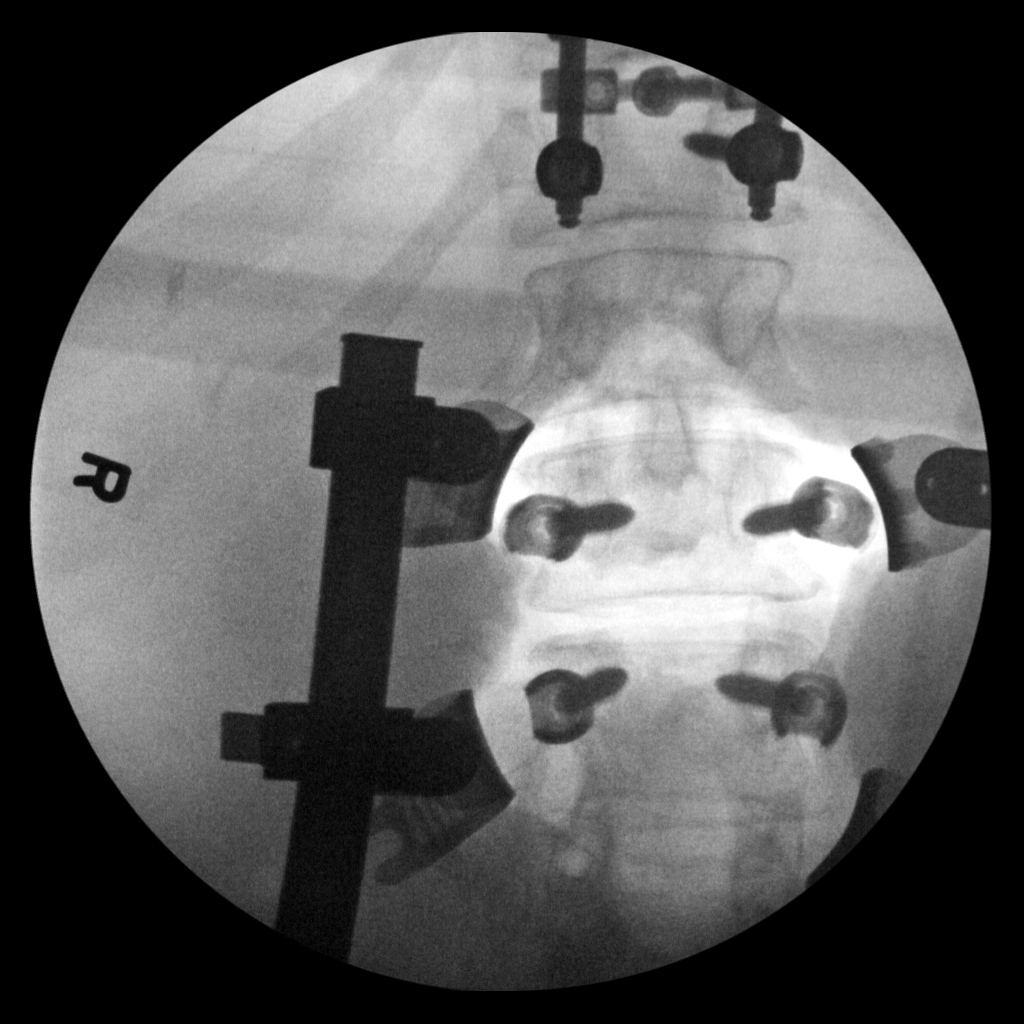

[3 of 3 positions shown; findings below may reference images not displayed]

FINDINGS: Three intraoperative fluoroscopic images were obtained of the lumbar
spine. These images demonstrate bilateral intrapedicular screw
placement involving an upper lumbar vertebral body.
IMPRESSION: Fluoroscopic guidance provided during surgical posterior fusion of
lumbar spine.

## 2019-10-02 IMAGING — RF DG C-ARM 1-60 MIN
1 series · 3 of 3 positions shown · non-contrast
Comparison: MRI [DATE].

CLINICAL DATA: Surgical posterior fusion of lumbar spine.

EXAM:
LUMBAR SPINE - 2-3 VIEW; DG C-ARM 1-60 MIN
FLUOROSCOPY TIME:  37 seconds.

[Series 1: run · 3 of 3 slices shown]
[im 1/3]
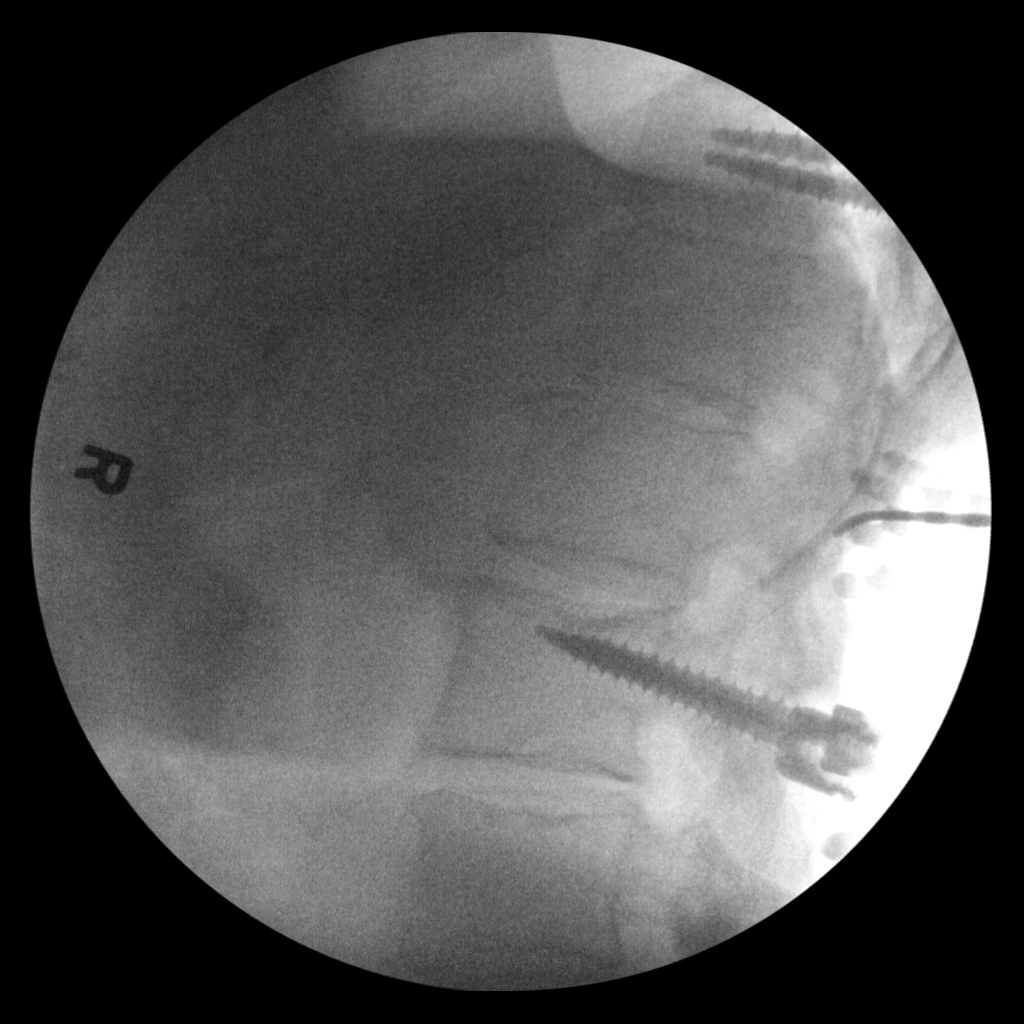
[im 2/3]
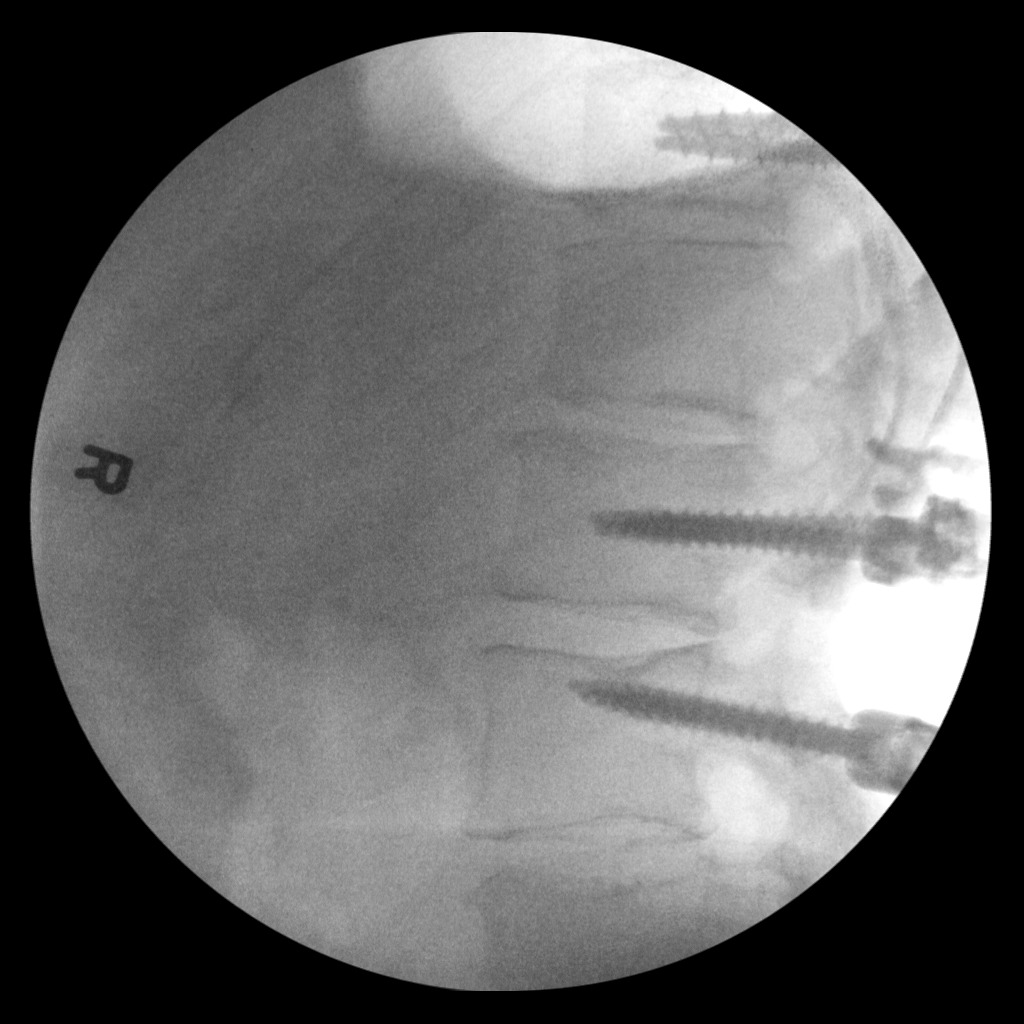
[im 3/3]
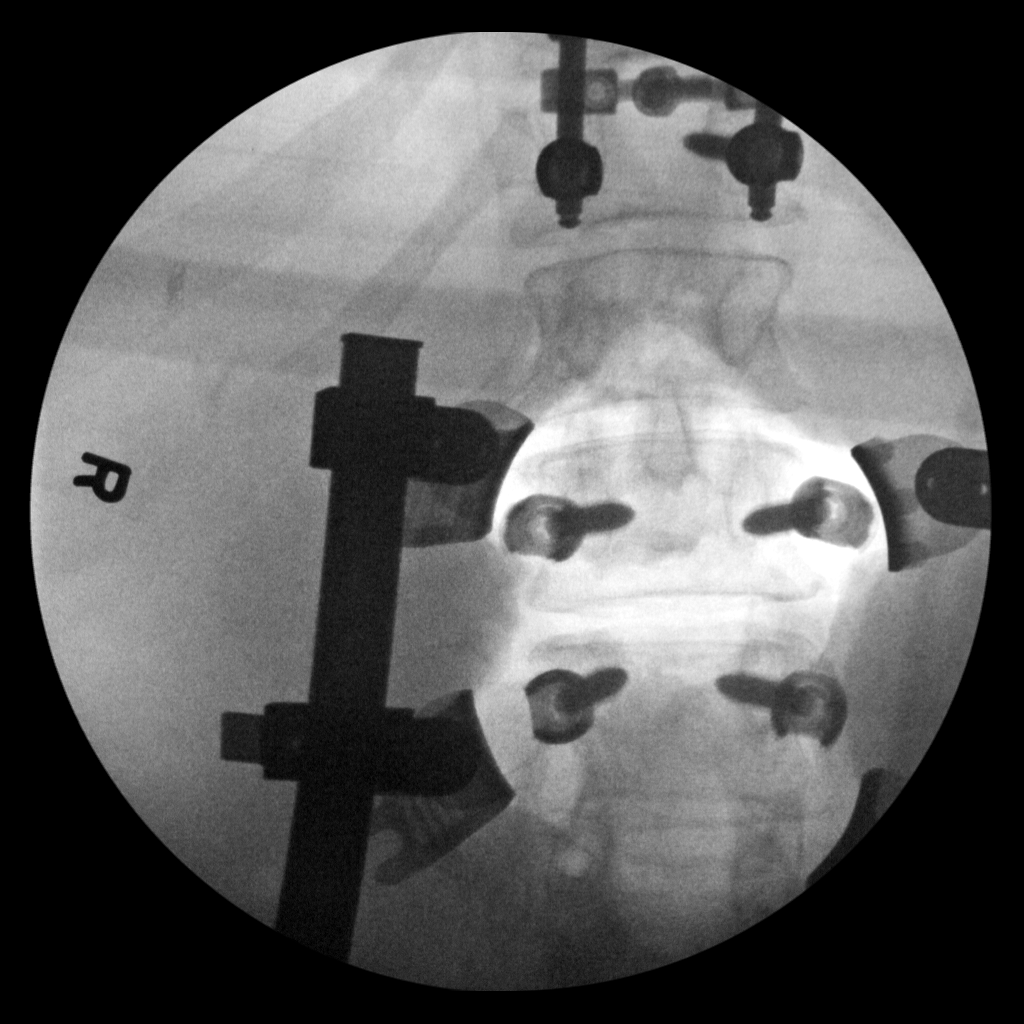

[3 of 3 positions shown; findings below may reference images not displayed]

FINDINGS: Three intraoperative fluoroscopic images were obtained of the lumbar
spine. These images demonstrate bilateral intrapedicular screw
placement involving an upper lumbar vertebral body.
IMPRESSION: Fluoroscopic guidance provided during surgical posterior fusion of
lumbar spine.

## 2019-10-02 SURGERY — POSTERIOR LUMBAR FUSION 2 LEVEL
Anesthesia: General | Site: Back

## 2019-10-02 MED ORDER — BUPIVACAINE LIPOSOME 1.3 % IJ SUSP
20.0000 mL | INTRAMUSCULAR | Status: AC
  Filled 2019-10-02: qty 20

## 2019-10-02 MED ORDER — FLEET ENEMA 7-19 GM/118ML RE ENEM: 1.0000 | ENEMA | Freq: Once | RECTAL | Status: DC | PRN

## 2019-10-02 MED ORDER — BUPIVACAINE HCL (PF) 0.5 % IJ SOLN
INTRAMUSCULAR | Status: AC
  Filled 2019-10-02: qty 30

## 2019-10-02 MED ORDER — LACTATED RINGERS IV SOLN: INTRAVENOUS | Status: DC

## 2019-10-02 MED ORDER — DOCUSATE SODIUM 100 MG PO CAPS: 100.0000 mg | ORAL_CAPSULE | Freq: Two times a day (BID) | ORAL | Status: DC

## 2019-10-02 MED ORDER — HYDROCODONE-ACETAMINOPHEN 5-325 MG PO TABS
ORAL_TABLET | ORAL | Status: AC
  Filled 2019-10-02: qty 2

## 2019-10-02 MED ORDER — FENTANYL CITRATE (PF) 100 MCG/2ML IJ SOLN
INTRAMUSCULAR | Status: DC | PRN
  Administered 2019-10-02 (×2): 50 ug via INTRAVENOUS
  Administered 2019-10-02: 200 ug via INTRAVENOUS
  Administered 2019-10-02 (×4): 50 ug via INTRAVENOUS

## 2019-10-02 MED ORDER — METHOCARBAMOL 500 MG PO TABS
500.0000 mg | ORAL_TABLET | Freq: Four times a day (QID) | ORAL | Status: DC | PRN
  Administered 2019-10-02 – 2019-10-05 (×5): 500 mg via ORAL
  Filled 2019-10-02 (×4): qty 1

## 2019-10-02 MED ORDER — ALUM & MAG HYDROXIDE-SIMETH 200-200-20 MG/5ML PO SUSP: 30.0000 mL | Freq: Four times a day (QID) | ORAL | Status: DC | PRN

## 2019-10-02 MED ORDER — ROCURONIUM BROMIDE 10 MG/ML (PF) SYRINGE
PREFILLED_SYRINGE | INTRAVENOUS | Status: AC
  Filled 2019-10-02: qty 10

## 2019-10-02 MED ORDER — CEFAZOLIN SODIUM 1 G IJ SOLR
INTRAMUSCULAR | Status: AC
  Filled 2019-10-02: qty 10

## 2019-10-02 MED ORDER — BUPIVACAINE HCL (PF) 0.5 % IJ SOLN
INTRAMUSCULAR | Status: DC | PRN
  Administered 2019-10-02: 5 mL

## 2019-10-02 MED ORDER — PROPOFOL 10 MG/ML IV BOLUS
INTRAVENOUS | Status: DC | PRN
  Administered 2019-10-02: 200 mg via INTRAVENOUS
  Administered 2019-10-02: 25 mg via INTRAVENOUS

## 2019-10-02 MED ORDER — MENTHOL 3 MG MT LOZG: 1.0000 | LOZENGE | OROMUCOSAL | Status: DC | PRN

## 2019-10-02 MED ORDER — POLYETHYLENE GLYCOL 3350 17 G PO PACK: 17.0000 g | PACK | Freq: Every day | ORAL | Status: DC | PRN

## 2019-10-02 MED ORDER — FENTANYL CITRATE (PF) 250 MCG/5ML IJ SOLN
INTRAMUSCULAR | Status: AC
  Filled 2019-10-02: qty 5

## 2019-10-02 MED ORDER — ACETAMINOPHEN 650 MG RE SUPP: 650.0000 mg | RECTAL | Status: DC | PRN

## 2019-10-02 MED ORDER — SUGAMMADEX SODIUM 500 MG/5ML IV SOLN
INTRAVENOUS | Status: DC | PRN
  Administered 2019-10-02: 500 mg via INTRAVENOUS

## 2019-10-02 MED ORDER — SODIUM CHLORIDE 0.9 % IV SOLN: 250.0000 mL | INTRAVENOUS | Status: DC

## 2019-10-02 MED ORDER — LIDOCAINE-EPINEPHRINE 1 %-1:100000 IJ SOLN
INTRAMUSCULAR | Status: AC
  Filled 2019-10-02: qty 1

## 2019-10-02 MED ORDER — 0.9 % SODIUM CHLORIDE (POUR BTL) OPTIME
TOPICAL | Status: DC | PRN
  Administered 2019-10-02: 1000 mL

## 2019-10-02 MED ORDER — ONDANSETRON HCL 4 MG/2ML IJ SOLN
INTRAMUSCULAR | Status: AC
  Filled 2019-10-02: qty 2

## 2019-10-02 MED ORDER — METHOCARBAMOL 1000 MG/10ML IJ SOLN
500.0000 mg | Freq: Four times a day (QID) | INTRAVENOUS | Status: DC | PRN
  Filled 2019-10-02: qty 5

## 2019-10-02 MED ORDER — KCL IN DEXTROSE-NACL 20-5-0.45 MEQ/L-%-% IV SOLN
INTRAVENOUS | Status: DC
  Filled 2019-10-02: qty 1000

## 2019-10-02 MED ORDER — METHOCARBAMOL 500 MG PO TABS
ORAL_TABLET | ORAL | Status: AC
  Filled 2019-10-02: qty 1

## 2019-10-02 MED ORDER — THROMBIN 5000 UNITS EX SOLR
CUTANEOUS | Status: AC
  Filled 2019-10-02: qty 5000

## 2019-10-02 MED ORDER — SODIUM CHLORIDE 0.9% FLUSH
3.0000 mL | Freq: Two times a day (BID) | INTRAVENOUS | Status: DC
  Administered 2019-10-02 – 2019-10-05 (×5): 3 mL via INTRAVENOUS

## 2019-10-02 MED ORDER — THROMBIN 20000 UNITS EX SOLR
CUTANEOUS | Status: AC
  Filled 2019-10-02: qty 20000

## 2019-10-02 MED ORDER — LIDOCAINE 2% (20 MG/ML) 5 ML SYRINGE
INTRAMUSCULAR | Status: DC | PRN
  Administered 2019-10-02: 100 mg via INTRAVENOUS

## 2019-10-02 MED ORDER — MIDAZOLAM HCL 5 MG/5ML IJ SOLN
INTRAMUSCULAR | Status: DC | PRN
  Administered 2019-10-02: 2 mg via INTRAVENOUS

## 2019-10-02 MED ORDER — HYDROMORPHONE HCL 1 MG/ML IJ SOLN
1.0000 mg | INTRAMUSCULAR | Status: DC | PRN
  Administered 2019-10-02: 20:00:00 1 mg via INTRAVENOUS
  Filled 2019-10-02: qty 1

## 2019-10-02 MED ORDER — ONDANSETRON HCL 4 MG/2ML IJ SOLN
INTRAMUSCULAR | Status: DC | PRN
  Administered 2019-10-02: 4 mg via INTRAVENOUS

## 2019-10-02 MED ORDER — CEFAZOLIN SODIUM-DEXTROSE 2-4 GM/100ML-% IV SOLN
2.0000 g | INTRAVENOUS | Status: AC
  Administered 2019-10-02: 14:00:00 2 g via INTRAVENOUS
  Administered 2019-10-02: 15:00:00 1 g via INTRAVENOUS

## 2019-10-02 MED ORDER — ACETAMINOPHEN 325 MG PO TABS: 650.0000 mg | ORAL_TABLET | ORAL | Status: DC | PRN

## 2019-10-02 MED ORDER — ZOLPIDEM TARTRATE 5 MG PO TABS: 5.0000 mg | ORAL_TABLET | Freq: Every evening | ORAL | Status: DC | PRN

## 2019-10-02 MED ORDER — THROMBIN 20000 UNITS EX SOLR: CUTANEOUS | Status: DC | PRN

## 2019-10-02 MED ORDER — PHENOL 1.4 % MT LIQD: 1.0000 | OROMUCOSAL | Status: DC | PRN

## 2019-10-02 MED ORDER — DEXAMETHASONE SODIUM PHOSPHATE 10 MG/ML IJ SOLN
INTRAMUSCULAR | Status: DC | PRN
  Administered 2019-10-02: 10 mg via INTRAVENOUS

## 2019-10-02 MED ORDER — ONDANSETRON HCL 4 MG/2ML IJ SOLN: 4.0000 mg | Freq: Four times a day (QID) | INTRAMUSCULAR | Status: DC | PRN

## 2019-10-02 MED ORDER — HYDROCODONE-ACETAMINOPHEN 5-325 MG PO TABS: 1.0000 | ORAL_TABLET | ORAL | Status: DC | PRN

## 2019-10-02 MED ORDER — HYDROMORPHONE HCL 1 MG/ML IJ SOLN
INTRAMUSCULAR | Status: AC
  Filled 2019-10-02: qty 1

## 2019-10-02 MED ORDER — THROMBIN 5000 UNITS EX SOLR: OROMUCOSAL | Status: DC | PRN

## 2019-10-02 MED ORDER — LIDOCAINE-EPINEPHRINE 1 %-1:100000 IJ SOLN
INTRAMUSCULAR | Status: DC | PRN
  Administered 2019-10-02: 5 mL

## 2019-10-02 MED ORDER — MIDAZOLAM HCL 2 MG/2ML IJ SOLN
INTRAMUSCULAR | Status: AC
  Filled 2019-10-02: qty 2

## 2019-10-02 MED ORDER — ROCURONIUM BROMIDE 50 MG/5ML IV SOSY
PREFILLED_SYRINGE | INTRAVENOUS | Status: DC | PRN
  Administered 2019-10-02: 20 mg via INTRAVENOUS
  Administered 2019-10-02: 80 mg via INTRAVENOUS

## 2019-10-02 MED ORDER — PROMETHAZINE HCL 25 MG/ML IJ SOLN: 6.2500 mg | INTRAMUSCULAR | Status: DC | PRN

## 2019-10-02 MED ORDER — LIDOCAINE 2% (20 MG/ML) 5 ML SYRINGE
INTRAMUSCULAR | Status: AC
  Filled 2019-10-02: qty 5

## 2019-10-02 MED ORDER — CEFAZOLIN SODIUM-DEXTROSE 2-4 GM/100ML-% IV SOLN
INTRAVENOUS | Status: AC
  Filled 2019-10-02: qty 100

## 2019-10-02 MED ORDER — KETOROLAC TROMETHAMINE 30 MG/ML IJ SOLN: 30.0000 mg | Freq: Once | INTRAMUSCULAR | Status: DC | PRN

## 2019-10-02 MED ORDER — DEXAMETHASONE SODIUM PHOSPHATE 10 MG/ML IJ SOLN
INTRAMUSCULAR | Status: AC
  Filled 2019-10-02: qty 1

## 2019-10-02 MED ORDER — PROPOFOL 10 MG/ML IV BOLUS
INTRAVENOUS | Status: AC
  Filled 2019-10-02: qty 20

## 2019-10-02 MED ORDER — PANTOPRAZOLE SODIUM 40 MG IV SOLR
40.0000 mg | Freq: Every day | INTRAVENOUS | Status: DC
  Administered 2019-10-02: 22:00:00 40 mg via INTRAVENOUS
  Filled 2019-10-02: qty 40

## 2019-10-02 MED ORDER — BISACODYL 10 MG RE SUPP: 10.0000 mg | Freq: Every day | RECTAL | Status: DC | PRN

## 2019-10-02 MED ORDER — CEFAZOLIN SODIUM-DEXTROSE 2-4 GM/100ML-% IV SOLN
2.0000 g | Freq: Three times a day (TID) | INTRAVENOUS | Status: AC
  Administered 2019-10-02 – 2019-10-03 (×2): 2 g via INTRAVENOUS
  Filled 2019-10-02 (×2): qty 100

## 2019-10-02 MED ORDER — SODIUM CHLORIDE 0.9% FLUSH: 3.0000 mL | INTRAVENOUS | Status: DC | PRN

## 2019-10-02 MED ORDER — HYDROMORPHONE HCL 1 MG/ML IJ SOLN
0.2500 mg | INTRAMUSCULAR | Status: DC | PRN
  Administered 2019-10-02 (×4): 0.5 mg via INTRAVENOUS

## 2019-10-02 MED ORDER — BUPIVACAINE LIPOSOME 1.3 % IJ SUSP
INTRAMUSCULAR | Status: DC | PRN
  Administered 2019-10-02: 20 mL

## 2019-10-02 MED ORDER — ONDANSETRON HCL 4 MG PO TABS: 4.0000 mg | ORAL_TABLET | Freq: Four times a day (QID) | ORAL | Status: DC | PRN

## 2019-10-02 MED ORDER — HYDROCODONE-ACETAMINOPHEN 5-325 MG PO TABS
2.0000 | ORAL_TABLET | ORAL | Status: DC | PRN
  Administered 2019-10-02 – 2019-10-05 (×9): 2 via ORAL
  Filled 2019-10-02 (×8): qty 2

## 2019-10-02 SURGICAL SUPPLY — 60 items
BAND RUBBER #18 3X1/16 STRL (MISCELLANEOUS) ×4 IMPLANT
BASKET BONE COLLECTION (BASKET) ×3 IMPLANT
BUR MATCHSTICK NEURO 3.0 LAGG (BURR) ×3 IMPLANT
BUR PRECISION FLUTE 5.0 (BURR) ×5 IMPLANT
CANISTER SUCT 3000ML PPV (MISCELLANEOUS) ×3 IMPLANT
CARTRIDGE OIL MAESTRO DRILL (MISCELLANEOUS) ×1 IMPLANT
CONT SPEC 4OZ CLIKSEAL STRL BL (MISCELLANEOUS) ×5 IMPLANT
COVER BACK TABLE 60X90IN (DRAPES) ×3 IMPLANT
DERMABOND ADVANCED (GAUZE/BANDAGES/DRESSINGS) ×2
DERMABOND ADVANCED .7 DNX12 (GAUZE/BANDAGES/DRESSINGS) ×1 IMPLANT
DIFFUSER DRILL AIR PNEUMATIC (MISCELLANEOUS) ×3 IMPLANT
DRAPE C-ARM 42X72 X-RAY (DRAPES) ×3 IMPLANT
DRAPE C-ARMOR (DRAPES) ×3 IMPLANT
DRAPE LAPAROTOMY 100X72X124 (DRAPES) ×3 IMPLANT
DRAPE MICROSCOPE LEICA (MISCELLANEOUS) ×2 IMPLANT
DRAPE SURG 17X23 STRL (DRAPES) ×3 IMPLANT
DRSG OPSITE POSTOP 4X8 (GAUZE/BANDAGES/DRESSINGS) ×2 IMPLANT
DURAPREP 26ML APPLICATOR (WOUND CARE) ×3 IMPLANT
ELECT BLADE 4.0 EZ CLEAN MEGAD (MISCELLANEOUS) ×3
ELECT REM PT RETURN 9FT ADLT (ELECTROSURGICAL) ×3
ELECTRODE BLDE 4.0 EZ CLN MEGD (MISCELLANEOUS) IMPLANT
ELECTRODE REM PT RTRN 9FT ADLT (ELECTROSURGICAL) ×1 IMPLANT
EVACUATOR 1/8 PVC DRAIN (DRAIN) IMPLANT
GAUZE 4X4 16PLY RFD (DISPOSABLE) IMPLANT
GAUZE SPONGE 4X4 12PLY STRL (GAUZE/BANDAGES/DRESSINGS) ×3 IMPLANT
GLOVE BIO SURGEON STRL SZ8 (GLOVE) ×6 IMPLANT
GLOVE BIOGEL PI IND STRL 8 (GLOVE) ×2 IMPLANT
GLOVE BIOGEL PI IND STRL 8.5 (GLOVE) ×2 IMPLANT
GLOVE BIOGEL PI INDICATOR 8 (GLOVE) ×4
GLOVE BIOGEL PI INDICATOR 8.5 (GLOVE) ×4
GLOVE ECLIPSE 8.0 STRL XLNG CF (GLOVE) ×6 IMPLANT
GOWN STRL REUS W/ TWL XL LVL3 (GOWN DISPOSABLE) ×3 IMPLANT
GOWN STRL REUS W/TWL 2XL LVL3 (GOWN DISPOSABLE) ×6 IMPLANT
GOWN STRL REUS W/TWL LRG LVL3 (GOWN DISPOSABLE) ×4 IMPLANT
GOWN STRL REUS W/TWL XL LVL3 (GOWN DISPOSABLE) ×10 IMPLANT
HEMOSTAT POWDER KIT SURGIFOAM (HEMOSTASIS) ×5 IMPLANT
KIT BASIN OR (CUSTOM PROCEDURE TRAY) ×3 IMPLANT
KIT POSITION SURG JACKSON T1 (MISCELLANEOUS) ×3 IMPLANT
KIT TURNOVER KIT B (KITS) ×3 IMPLANT
MILL MEDIUM DISP (BLADE) ×2 IMPLANT
NDL HYPO 25X1 1.5 SAFETY (NEEDLE) ×1 IMPLANT
NDL SPNL 18GX3.5 QUINCKE PK (NEEDLE) IMPLANT
NEEDLE HYPO 25X1 1.5 SAFETY (NEEDLE) ×3 IMPLANT
NEEDLE SPNL 18GX3.5 QUINCKE PK (NEEDLE) ×3 IMPLANT
NS IRRIG 1000ML POUR BTL (IV SOLUTION) ×3 IMPLANT
OIL CARTRIDGE MAESTRO DRILL (MISCELLANEOUS) ×3
PACK LAMINECTOMY NEURO (CUSTOM PROCEDURE TRAY) ×3 IMPLANT
PATTIES SURGICAL 1X1 (DISPOSABLE) ×2 IMPLANT
ROD RELINE-O LORD 5.5X50MM (Rod) ×4 IMPLANT
SCREW LOCK RELINE 5.5 TULIP (Screw) ×8 IMPLANT
SCREW RELINE-O POLY 6.5X50MM (Screw) ×8 IMPLANT
SPONGE SURGIFOAM ABS GEL 100 (HEMOSTASIS) ×2 IMPLANT
STAPLER SKIN PROX WIDE 3.9 (STAPLE) ×2 IMPLANT
SUT VIC AB 1 CT1 18XBRD ANBCTR (SUTURE) ×2 IMPLANT
SUT VIC AB 1 CT1 8-18 (SUTURE) ×2
SUT VIC AB 2-0 CT1 18 (SUTURE) ×4 IMPLANT
SUT VIC AB 3-0 SH 8-18 (SUTURE) ×4 IMPLANT
TOWEL GREEN STERILE (TOWEL DISPOSABLE) ×3 IMPLANT
TOWEL GREEN STERILE FF (TOWEL DISPOSABLE) ×3 IMPLANT
WATER STERILE IRR 1000ML POUR (IV SOLUTION) ×3 IMPLANT

## 2019-10-02 NOTE — Anesthesia Procedure Notes (Signed)
Procedure Name: Intubation Date/Time: 10/02/2019 2:12 PM Performed by: Lance Coon, CRNA Pre-anesthesia Checklist: Patient identified, Emergency Drugs available, Suction available, Patient being monitored and Timeout performed Patient Re-evaluated:Patient Re-evaluated prior to induction Oxygen Delivery Method: Circle system utilized Preoxygenation: Pre-oxygenation with 100% oxygen Induction Type: IV induction Ventilation: Mask ventilation without difficulty Laryngoscope Size: Miller and 2 Grade View: Grade II Tube type: Oral Tube size: 7.0 mm Number of attempts: 1 Airway Equipment and Method: Stylet Placement Confirmation: ETT inserted through vocal cords under direct vision,  positive ETCO2 and breath sounds checked- equal and bilateral Secured at: 21 cm Tube secured with: Tape Dental Injury: Teeth and Oropharynx as per pre-operative assessment

## 2019-10-02 NOTE — Anesthesia Postprocedure Evaluation (Signed)
Anesthesia Post Note  Patient: Mary Chambers  Procedure(s) Performed: Exploration of Lumbar Two-Three with Lumbar One-Two Discectomy and Fusion (N/A Back)     Patient location during evaluation: PACU Anesthesia Type: General Level of consciousness: awake and alert Pain management: pain level controlled Vital Signs Assessment: post-procedure vital signs reviewed and stable Respiratory status: spontaneous breathing, nonlabored ventilation, respiratory function stable and patient connected to nasal cannula oxygen Cardiovascular status: blood pressure returned to baseline and stable Postop Assessment: no apparent nausea or vomiting Anesthetic complications: no    Last Vitals:  Vitals:   10/02/19 1745 10/02/19 1800  BP: 135/89 128/71  Pulse: 96 67  Resp: 17 17  Temp:    SpO2: 100% 100%    Last Pain:  Vitals:   10/02/19 1800  TempSrc:   PainSc: 3     LLE Motor Response: Purposeful movement (10/02/19 1800) LLE Sensation: Full sensation (10/02/19 1800) RLE Motor Response: Purposeful movement (10/02/19 1800) RLE Sensation: Full sensation (10/02/19 1800)      Jewell Haught S

## 2019-10-02 NOTE — Progress Notes (Signed)
Subjective: Patient reports "I'm ok. This leg is weaker"  Objective: Vital signs in last 24 hours: Temp:  [97.7 F (36.5 C)-98.4 F (36.9 C)] 98.4 F (36.9 C) (12/31 0748) Pulse Rate:  [78-103] 88 (12/31 0748) Resp:  [17-20] 18 (12/31 0748) BP: (102-156)/(59-105) 102/59 (12/31 0748) SpO2:  [94 %-98 %] 97 % (12/31 0748)  Intake/Output from previous day: 12/30 0701 - 12/31 0700 In: 1240 [P.O.:240; I.V.:1000] Out: 2350 [Urine:2350] Intake/Output this shift: No intake/output data recorded.  Alert, conversant. Plantar/dorsiflexion good bilat, but profound right hip flexor weakness.   Lab Results: Recent Labs    10/01/19 0844  WBC 14.5*  HGB 13.9  HCT 44.0  PLT 336   BMET Recent Labs    10/01/19 0844  NA 137  K 4.2  CL 104  CO2 22  GLUCOSE 129*  BUN 19  CREATININE 0.76  CALCIUM 9.0    Studies/Results: MR LUMBAR SPINE WO CONTRAST  Result Date: 10/01/2019 CLINICAL DATA:  Low back pain, progressive neurologic deficit. Additional history provided: Status post fall 3 weeks ago, worsening right leg weakness since 3 a.m. today unable to lift right leg, unable to ambulate, numbness right leg. EXAM: MRI LUMBAR SPINE WITHOUT CONTRAST TECHNIQUE: Multiplanar, multisequence MR imaging of the lumbar spine was performed. No intravenous contrast was administered. COMPARISON:  Lumbar spine radiographs 09/22/2019 FINDINGS: Segmentation: Spinal numbering will remain consistent with prior examinations. The lowest well-formed intervertebral disc is designated L5-S1. Alignment: Lumbar levocurvature. Trace T12-L1 and L1-L2 retrolisthesis. Vertebrae: Vertebral body height is maintained. Incompletely imaged posterior spinal fusion construct at T10-T11. Sequela of prior L2-L3 posterior decompression with posterior spinal fusion construct at this level. Susceptibility artifact arising from the fusion hardware slightly limits evaluation of marrow signal at these levels. No appreciable marrow edema or  suspicious osseous lesion. L1 vertebral body hemangioma. Conus medullaris and cauda equina: Conus extends to the T12 level. No signal abnormality within the visualized distal spinal cord. Paraspinal and other soft tissues: No abnormality identified within included portions of the abdomen/retroperitoneum. Postsurgical changes to the paraspinal soft tissues. Marked atrophy of the lower lumbar paraspinal musculature. Disc levels: Mild multilevel disc degeneration. T12-L1: Small disc bulge. Superimposed tiny cranially migrated central disc extrusion. No significant spinal canal stenosis or neural foraminal narrowing. L1-L2: Disc bulge. Superimposed moderate-sized right center/subarticular disc extrusion with mild caudal migration. Facet/ligamentum flavum hypertrophy. Severe spinal canal stenosis with encroachment upon right greater than left descending cauda equina nerve roots. Redundancy of the cauda equina nerve roots cephalad to this level. Mild right neural foraminal narrowing. L2-L3: Sequela of prior posterior decompression and fusion. Disc bulge with endplate spurring. Spinal canal widely patent. No significant foraminal stenosis. L3-L4: Sequela of prior posterior decompression. Small disc bulge with endplate spurring. Facet hypertrophy. Spinal canal widely patent. No significant neural foraminal narrowing. L4-L5: Disc bulge asymmetric to the left with endplate spurring. Moderate facet arthrosis with ligamentum flavum hypertrophy. 8 mm posteriorly projecting synovial facet cyst on the left. Moderate left subarticular stenosis with crowding of the descending left L5 nerve root. Mild relative central canal narrowing. Mild/moderate left neural foraminal narrowing. L5-S1: Small disc bulge. Mild facet arthrosis/ligamentum flavum hypertrophy. No significant spinal canal stenosis or neural foraminal narrowing. These results were called by telephone at the time of interpretation on 10/01/2019 at 7:59 am to provider Dr.  Rona Ravens, who verbally acknowledged these results. IMPRESSION: Spinal numbering will remain consistent with previous examinations. Sequela of prior posterior decompression at L2-L3 and L3-L4 with L2-L3 posterior spinal fusion construct. Spinal canal  widely patent at these levels. Incompletely imaged T10-T11 posterior spinal fusion construct. At L1-L2, a moderate-sized right center/subarticular caudally migrated disc extrusion contributes to severe spinal canal stenosis with encroachment upon right greater than left cauda equina nerve roots. There is redundancy of the cauda equina nerve roots cephalad to this level. Mild right neural foraminal narrowing. At L4-L5, multifactorial left subarticular stenosis with crowding of the descending left L5 nerve root. Mild central canal narrowing and mild/moderate left neural foraminal narrowing also present at this level. Electronically Signed   By: Kellie Simmering DO   On: 10/01/2019 07:59    Assessment/Plan:   LOS: 1 day  Agreeable with plan for surgery today. L1-2 decompression and fusion with exploration of previously fused L2-3 level. NPO. Consent has been signed by pt.   Mary Chambers 10/02/2019, 8:04 AM

## 2019-10-02 NOTE — Brief Op Note (Signed)
10/02/2019  5:34 PM  PATIENT:  Mary Chambers  48 y.o. female  PRE-OPERATIVE DIAGNOSIS:  Lumbar spinal stenosis, herniated lumbar disc, profound right leg weakness, spondylosis, disc degeneration, lumbago, radiculopathy L 12 level  POST-OPERATIVE DIAGNOSIS: Lumbar spinal stenosis, herniated lumbar disc, profound right leg weakness, spondylosis, disc degeneration, lumbago, radiculopathy L 12 level   PROCEDURE:  Procedure(s): Exploration of Lumbar Two-Three with Lumbar One-Two Discectomy and Fusion (N/A) with pedicle screw fixation and posterolateral arthrodesis  SURGEON:  Surgeon(s) and Role:    [name], MD - Primary    * Judith Part, MD - Assisting  PHYSICIAN ASSISTANT:   ASSISTANTS: Poteat, RN   ANESTHESIA:   general  EBL:  300 mL   BLOOD ADMINISTERED:none  DRAINS: (Medium) Hemovact drain(s) in the epidural space with  Suction Open   LOCAL MEDICATIONS USED:  MARCAINE    and LIDOCAINE   SPECIMEN:  No Specimen  DISPOSITION OF SPECIMEN:  N/A  COUNTS:  YES  TOURNIQUET:  * No tourniquets in log *  DICTATION: Patient is 48 year old woman with previous fusion L 23 level with severe spinal stenosis at  L 12 with a large disc herniation and severe right leg pain and weakness with  and a long history of severe back and bilateral leg pain.  It was elected to take her to surgery for exploration of previous fusion with decompression and fusion from L 12 level.   Procedure: Patient was placed in a prone position on the [place] table after smooth and uncomplicated induction of general endotracheal anesthesia. Her back was marked with X ray.  Her low back was prepped and draped in usual sterile fashion with betadine scrub and DuraPrep. Area of incision was infiltrated with local lidocaine. Incision was made to the lumbodorsal fascia was incised and exposure was performed of the L 1 - L 3 spinous processes laminae facet joint and transverse processes. Previous hardware  was exposed.  This was covered with dense bone growth and required a chisel to remove the bone to expose the previously placed hardware. The bone screws were removed and appeared to be solidly in bone and there was dense bridging bone across the L 23 level.  New screws were placed at L 2 (6.5 x 50 mm). Intraoperative x-ray was obtained which confirmed correct orientation with marker probes at L1 level. A total laminectomy of L 1 through L 2  levels was performed with disarticulation of the facet joints and thorough decompression was performed of both L1 , L 2  nerve roots along with the common dural tube. Decompression was greater than would be typical for PLIF. A thorough discectomy with removal  Of free fragments at L 23 on the right with the microscope with removal of a large free fragment behind the L 2 vertebral body. The posterolateral region was extensively decorticated and pedicle probes were placed at L 1 bilaterally. Intraoperative fluoroscopy confirmed correct orientationin the AP and lateral plane. 50 x 6.5 mm pedicle screws were placed at L 1 bilaterally. Final x-rays demonstrated well-positioned interbody grafts and pedicle screw fixation. A 50 mm lordotic rod was placed on the right and a 50 mm rod was placed on the left locked down in situ and the posterolateral region was packed with 20 cc bone graft on the left. The wound was irrigated. A medium Hemovac drain was placed. Fascia was closed with 1 Vicryl sutures skin edges were reapproximated 2 and 3-0 Vicryl sutures. Long-acting Marcaine was injected in the  deep musculature.  The wound was dressed with Dermabond and  an occlusive dressing the patient was extubated in the operating room and taken to recovery in stable satisfactory condition she tolerated traction well counts were correct at the end of the case.   PLAN OF CARE: Admit to inpatient   PATIENT DISPOSITION:  PACU - hemodynamically stable.   Delay start of Pharmacological VTE agent  (>24hrs) due to surgical blood loss or risk of bleeding: yes

## 2019-10-02 NOTE — H&P (View-Only) (Signed)
Subjective: Patient reports "I'm ok. This leg is weaker"  Objective: Vital signs in last 24 hours: Temp:  [97.7 F (36.5 C)-98.4 F (36.9 C)] 98.4 F (36.9 C) (12/31 0748) Pulse Rate:  [78-103] 88 (12/31 0748) Resp:  [17-20] 18 (12/31 0748) BP: (102-156)/(59-105) 102/59 (12/31 0748) SpO2:  [94 %-98 %] 97 % (12/31 0748)  Intake/Output from previous day: 12/30 0701 - 12/31 0700 In: 1240 [P.O.:240; I.V.:1000] Out: 2350 [Urine:2350] Intake/Output this shift: No intake/output data recorded.  Alert, conversant. Plantar/dorsiflexion good bilat, but profound right hip flexor weakness.   Lab Results: Recent Labs    10/01/19 0844  WBC 14.5*  HGB 13.9  HCT 44.0  PLT 336   BMET Recent Labs    10/01/19 0844  NA 137  K 4.2  CL 104  CO2 22  GLUCOSE 129*  BUN 19  CREATININE 0.76  CALCIUM 9.0    Studies/Results: MR LUMBAR SPINE WO CONTRAST  Result Date: 10/01/2019 CLINICAL DATA:  Low back pain, progressive neurologic deficit. Additional history provided: Status post fall 3 weeks ago, worsening right leg weakness since 3 a.m. today unable to lift right leg, unable to ambulate, numbness right leg. EXAM: MRI LUMBAR SPINE WITHOUT CONTRAST TECHNIQUE: Multiplanar, multisequence MR imaging of the lumbar spine was performed. No intravenous contrast was administered. COMPARISON:  Lumbar spine radiographs 09/22/2019 FINDINGS: Segmentation: Spinal numbering will remain consistent with prior examinations. The lowest well-formed intervertebral disc is designated L5-S1. Alignment: Lumbar levocurvature. Trace T12-L1 and L1-L2 retrolisthesis. Vertebrae: Vertebral body height is maintained. Incompletely imaged posterior spinal fusion construct at T10-T11. Sequela of prior L2-L3 posterior decompression with posterior spinal fusion construct at this level. Susceptibility artifact arising from the fusion hardware slightly limits evaluation of marrow signal at these levels. No appreciable marrow edema or  suspicious osseous lesion. L1 vertebral body hemangioma. Conus medullaris and cauda equina: Conus extends to the T12 level. No signal abnormality within the visualized distal spinal cord. Paraspinal and other soft tissues: No abnormality identified within included portions of the abdomen/retroperitoneum. Postsurgical changes to the paraspinal soft tissues. Marked atrophy of the lower lumbar paraspinal musculature. Disc levels: Mild multilevel disc degeneration. T12-L1: Small disc bulge. Superimposed tiny cranially migrated central disc extrusion. No significant spinal canal stenosis or neural foraminal narrowing. L1-L2: Disc bulge. Superimposed moderate-sized right center/subarticular disc extrusion with mild caudal migration. Facet/ligamentum flavum hypertrophy. Severe spinal canal stenosis with encroachment upon right greater than left descending cauda equina nerve roots. Redundancy of the cauda equina nerve roots cephalad to this level. Mild right neural foraminal narrowing. L2-L3: Sequela of prior posterior decompression and fusion. Disc bulge with endplate spurring. Spinal canal widely patent. No significant foraminal stenosis. L3-L4: Sequela of prior posterior decompression. Small disc bulge with endplate spurring. Facet hypertrophy. Spinal canal widely patent. No significant neural foraminal narrowing. L4-L5: Disc bulge asymmetric to the left with endplate spurring. Moderate facet arthrosis with ligamentum flavum hypertrophy. 8 mm posteriorly projecting synovial facet cyst on the left. Moderate left subarticular stenosis with crowding of the descending left L5 nerve root. Mild relative central canal narrowing. Mild/moderate left neural foraminal narrowing. L5-S1: Small disc bulge. Mild facet arthrosis/ligamentum flavum hypertrophy. No significant spinal canal stenosis or neural foraminal narrowing. These results were called by telephone at the time of interpretation on 10/01/2019 at 7:59 am to provider Dr.  Rona Ravens, who verbally acknowledged these results. IMPRESSION: Spinal numbering will remain consistent with previous examinations. Sequela of prior posterior decompression at L2-L3 and L3-L4 with L2-L3 posterior spinal fusion construct. Spinal canal  widely patent at these levels. Incompletely imaged T10-T11 posterior spinal fusion construct. At L1-L2, a moderate-sized right center/subarticular caudally migrated disc extrusion contributes to severe spinal canal stenosis with encroachment upon right greater than left cauda equina nerve roots. There is redundancy of the cauda equina nerve roots cephalad to this level. Mild right neural foraminal narrowing. At L4-L5, multifactorial left subarticular stenosis with crowding of the descending left L5 nerve root. Mild central canal narrowing and mild/moderate left neural foraminal narrowing also present at this level. Electronically Signed   By: Kellie Simmering DO   On: 10/01/2019 07:59    Assessment/Plan:   LOS: 1 day  Agreeable with plan for surgery today. L1-2 decompression and fusion with exploration of previously fused L2-3 level. NPO. Consent has been signed by pt.   Verdis Prime 10/02/2019, 8:04 AM

## 2019-10-02 NOTE — Interval H&P Note (Signed)
History and Physical Interval Note:  10/02/2019 1:17 PM  Mary Chambers  has presented today for surgery, with the diagnosis of Lumbar spinal stenosis.  The various methods of treatment have been discussed with the patient and family. After consideration of risks, benefits and other options for treatment, the patient has consented to  Procedure(s): Exploration of fusion L2-3, L1-2 discectomy and fusion (N/A) as a surgical intervention.  The patient's history has been reviewed, patient examined, no change in status, stable for surgery.  I have reviewed the patient's chart and labs.  Questions were answered to the patient's satisfaction.     Peggyann Shoals

## 2019-10-02 NOTE — Progress Notes (Signed)
Patient is awake, alert, conversant.  MAEW with significant improvement in right leg strength.  She is now able to lift her right leg off the bed without help.  She says her back hurts, but her right leg is much improved.

## 2019-10-02 NOTE — Progress Notes (Signed)
Patient is transferred from room 3C10 to OR at this time. Alert and in stable condition. Report given to receiving nurse Sam, RN with all questions answered. Left unit via bed with belongings at side.

## 2019-10-02 NOTE — Anesthesia Preprocedure Evaluation (Signed)
Anesthesia Evaluation  Patient identified by MRN, date of birth, ID band Patient awake    Reviewed: Allergy & Precautions, NPO status , Patient's Chart, lab work & pertinent test results  Airway Mallampati: I  TM Distance: >3 FB Neck ROM: Full   Comment: Grade I views previously with MAC 4 blade  Dental no notable dental hx.    Pulmonary neg pulmonary ROS,    Pulmonary exam normal breath sounds clear to auscultation       Cardiovascular hypertension, Normal cardiovascular exam Rhythm:Regular Rate:Normal     Neuro/Psych negative neurological ROS  negative psych ROS   GI/Hepatic negative GI ROS, Neg liver ROS,   Endo/Other  Morbid obesity  Renal/GU negative Renal ROS  negative genitourinary   Musculoskeletal negative musculoskeletal ROS (+)   Abdominal   Peds negative pediatric ROS (+)  Hematology negative hematology ROS (+)   Anesthesia Other Findings   Reproductive/Obstetrics negative OB ROS                             Anesthesia Physical Anesthesia Plan  ASA: III  Anesthesia Plan: General   Post-op Pain Management:    Induction: Intravenous  PONV Risk Score and Plan: 3 and Ondansetron, Dexamethasone and Treatment may vary due to age or medical condition  Airway Management Planned: Oral ETT  Additional Equipment:   Intra-op Plan:   Post-operative Plan: Extubation in OR  Informed Consent: I have reviewed the patients History and Physical, chart, labs and discussed the procedure including the risks, benefits and alternatives for the proposed anesthesia with the patient or authorized representative who has indicated his/her understanding and acceptance.     Dental advisory given  Plan Discussed with: CRNA and Surgeon  Anesthesia Plan Comments:         Anesthesia Quick Evaluation

## 2019-10-02 NOTE — Progress Notes (Signed)
Orthopedic Tech Progress Note Patient Details:  Mary Chambers [birth year] [id number] Note says that patient has the brace already.  Patient ID: Mary Chambers, female   DOB: 24-Jun-1971, 48 y.o.   MRN: [id number]   Staci Righter 10/02/2019, 10:00 PM

## 2019-10-02 NOTE — Transfer of Care (Signed)
Immediate Anesthesia Transfer of Care Note  Patient: SHANNARA NIGHT  Procedure(s) Performed: Exploration of Lumbar Two-Three with Lumbar One-Two Discectomy and Fusion (N/A Back)  Patient Location: PACU  Anesthesia Type:General  Level of Consciousness: awake, patient cooperative and responds to stimulation  Airway & Oxygen Therapy: Patient Spontanous Breathing  Post-op Assessment: Report given to RN, Post -op Vital signs reviewed and stable and Patient moving all extremities X 4  Post vital signs: Reviewed and stable  Last Vitals:  Vitals Value Taken Time  BP 130/63 10/02/19 1732  Temp 36.6 C 10/02/19 1732  Pulse 95 10/02/19 1735  Resp 17 10/02/19 1735  SpO2 100 % 10/02/19 1735  Vitals shown include unvalidated device data.  Last Pain:  Vitals:   10/02/19 1230  TempSrc:   PainSc: 0-No pain      Patients Stated Pain Goal: 3 ([id number])  Complications: No apparent anesthesia complications

## 2019-10-02 NOTE — Op Note (Signed)
10/02/2019  5:34 PM  PATIENT:  Mary Chambers  48 y.o. female  PRE-OPERATIVE DIAGNOSIS:  Lumbar spinal stenosis, herniated lumbar disc, profound right leg weakness, spondylosis, disc degeneration, lumbago, radiculopathy L 12 level  POST-OPERATIVE DIAGNOSIS: Lumbar spinal stenosis, herniated lumbar disc, profound right leg weakness, spondylosis, disc degeneration, lumbago, radiculopathy L 12 level   PROCEDURE:  Procedure(s): Exploration of Lumbar Two-Three with Lumbar One-Two Discectomy and Fusion (N/A) with pedicle screw fixation and posterolateral arthrodesis  SURGEON:  Surgeon(s) and Role:    [name], MD - Primary    * Judith Part, MD - Assisting  PHYSICIAN ASSISTANT:   ASSISTANTS: Poteat, RN   ANESTHESIA:   general  EBL:  300 mL   BLOOD ADMINISTERED:none  DRAINS: (Medium) Hemovact drain(s) in the epidural space with  Suction Open   LOCAL MEDICATIONS USED:  MARCAINE    and LIDOCAINE   SPECIMEN:  No Specimen  DISPOSITION OF SPECIMEN:  N/A  COUNTS:  YES  TOURNIQUET:  * No tourniquets in log *  DICTATION: Patient is 48 year old woman with previous fusion L 23 level with severe spinal stenosis at  L 12 with a large disc herniation and severe right leg pain and weakness with  and a long history of severe back and bilateral leg pain.  It was elected to take her to surgery for exploration of previous fusion with decompression and fusion from L 12 level.   Procedure: Patient was placed in a prone position on the [place] table after smooth and uncomplicated induction of general endotracheal anesthesia. Her back was marked with X ray.  Her low back was prepped and draped in usual sterile fashion with betadine scrub and DuraPrep. Area of incision was infiltrated with local lidocaine. Incision was made to the lumbodorsal fascia was incised and exposure was performed of the L 1 - L 3 spinous processes laminae facet joint and transverse processes. Previous hardware  was exposed.  This was covered with dense bone growth and required a chisel to remove the bone to expose the previously placed hardware. The bone screws were removed and appeared to be solidly in bone and there was dense bridging bone across the L 23 level.  New screws were placed at L 2 (6.5 x 50 mm). Intraoperative x-ray was obtained which confirmed correct orientation with marker probes at L1 level. A total laminectomy of L 1 through L 2  levels was performed with disarticulation of the facet joints and thorough decompression was performed of both L1 , L 2  nerve roots along with the common dural tube. Decompression was greater than would be typical for PLIF. A thorough discectomy with removal  Of free fragments at L 23 on the right with the microscope with removal of a large free fragment behind the L 2 vertebral body. The posterolateral region was extensively decorticated and pedicle probes were placed at L 1 bilaterally. Intraoperative fluoroscopy confirmed correct orientationin the AP and lateral plane. 50 x 6.5 mm pedicle screws were placed at L 1 bilaterally. Final x-rays demonstrated well-positioned interbody grafts and pedicle screw fixation. A 50 mm lordotic rod was placed on the right and a 50 mm rod was placed on the left locked down in situ and the posterolateral region was packed with 20 cc bone graft on the left. The wound was irrigated. A medium Hemovac drain was placed. Fascia was closed with 1 Vicryl sutures skin edges were reapproximated 2 and 3-0 Vicryl sutures. Long-acting Marcaine was injected in the  deep musculature.  The wound was dressed with Dermabond and  an occlusive dressing the patient was extubated in the operating room and taken to recovery in stable satisfactory condition she tolerated traction well counts were correct at the end of the case.   PLAN OF CARE: Admit to inpatient   PATIENT DISPOSITION:  PACU - hemodynamically stable.   Delay start of Pharmacological VTE agent  (>24hrs) due to surgical blood loss or risk of bleeding: yes

## 2019-10-02 NOTE — Progress Notes (Signed)
PT Cancellation Note  Patient Details Name: Mary Chambers MRN: [id number] DOB: Jul 26, 1971   Cancelled Treatment:    Reason Eval/Treat Not Completed: To OR this date.  PT eval received, chart reviewed. Pt to go to OR today for spinal surgery - will initiate PT evaluation post-op as able.    Thelma Comp 10/02/2019, 6:51 AM   Rolinda Roan, PT, DPT Acute Rehabilitation Services Pager: [phone] Office: [phone]

## 2019-10-02 NOTE — Progress Notes (Signed)
OT Cancellation Note  Patient Details Name: Mary Chambers MRN: [id number] DOB: [birth year]   Cancelled Treatment:    Reason Eval/Treat Not Completed: Patient not medically ready(Pt to undergo spinal surgery today. Will initiate OT s/p sx.)  [name] 10/02/2019, 8:21 AM  Nestor Lewandowsky, OTR/L Acute Rehabilitation Services Pager: [phone] Office: [phone]

## 2019-10-03 MED ORDER — PANTOPRAZOLE SODIUM 40 MG PO TBEC
40.0000 mg | DELAYED_RELEASE_TABLET | Freq: Every day | ORAL | Status: DC
  Administered 2019-10-03 – 2019-10-04 (×2): 40 mg via ORAL
  Filled 2019-10-03 (×2): qty 1

## 2019-10-03 NOTE — Evaluation (Addendum)
Occupational Therapy Evaluation Patient Details Name: Mary Chambers MRN: [id number] DOB: 1971-09-10 Today's Date: 10/03/2019    History of Present Illness Mary Chambers is a 49 y.o. female with history of HTN, cervical DDD, thoracic myelopathy and lumbar DDD s/p multiple cervical, thoracic and lumbar surgeries by Dr Aleene Davidson, who presents to the ED for the third time in the past three weeks for right leg pain & worsening numbness in RLE. Pt found to have large L1-2 disc herniation. Pt now s/p PLIF 1-2 on 12/31.   Clinical Impression   Patient is s/p PLIF 1-2  surgery resulting in functional limitations due to the deficits listed below (see OT problem list). Pt currently demonstrates foot drop bil and could benefit from hanger fit for AFO acutely. Pt shows pending [employer] and will need equipment issued acutely. Next Ot session to focus on AE for LB.  Patient will benefit from skilled OT acutely to increase independence and safety with ADLS to allow discharge [place].     Follow Up Recommendations  Home health OT    Equipment Recommendations  3 in 1 bedside commode;Other (comment)(RW - AFO for drop foot)    Recommendations for Other Services       Precautions / Restrictions Precautions Precautions: Back Precaution Booklet Issued: Yes (comment) Precaution Comments: pt with good understanding and recall from previous surgeries Required Braces or Orthoses: Spinal Brace Spinal Brace: Lumbar corset;Applied in supine position Restrictions Weight Bearing Restrictions: No      Mobility Bed Mobility Overal bed mobility: Needs Assistance Bed Mobility: Rolling;Sit to Supine Rolling: Mod assist     Sit to supine: Mod assist   General bed mobility comments: pt requires mod cues for sequence and to position in bed. pt with R LE abducting due to hip fleixon weakness noted. Pt requires max (A) fo rbil LE onto bed surface  Transfers Overall transfer level: Needs  assistance Equipment used: Rolling walker (2 wheeled) Transfers: Sit to/from Stand Sit to Stand: Min guard         General transfer comment: pt is able to power up onto rw with cues for straight back and avoid hip flexion    Balance Overall balance assessment: Needs assistance Sitting-balance support: No upper extremity supported;Feet supported Sitting balance-Leahy Scale: Fair     Standing balance support: Bilateral upper extremity supported;During functional activity Standing balance-Leahy Scale: Fair Standing balance comment: able to static stand at sink to wash hand with single UE release                           ADL either performed or assessed with clinical judgement   ADL Overall ADL's : Needs assistance/impaired Eating/Feeding: Modified independent   Grooming: Min guard;Standing   Upper Body Bathing: Set up   Lower Body Bathing: Moderate assistance   Upper Body Dressing : Set up;Sitting   Lower Body Dressing: Moderate assistance;Sit to/from stand(will require AE) Lower Body Dressing Details (indicate cue type and reason): pt educated on dressing R LE first and having family dress prior to leaving for work - daughter goes to work 330pm and son will be home after that.  Toilet Transfer: Min Retail banker and Hygiene: Min guard;Sitting/lateral lean Toileting - Clothing Manipulation Details (indicate cue type and reason): will need further education on toilet aide. Introduced this session and discussed wet wipes     Functional mobility during ADLs: Min guard;Rolling walker General ADL Comments: pt  noted to have foot drop and reports she has not made it to appointment for fit for braces. Will need to issues as much acutely as possible as patient is pending Medicaid.    Pt with min (A) to don doff brace> educated during session on proper fit. Back handout provided and reviewed adls in detail. Pt educated on: clothing between  brace, never sleep in brace, set an alarm at night for medication, avoid sitting for long periods of time, correct bed positioning for sleeping, correct sequence for bed mobility, avoiding lifting more than 5 pounds and never wash directly over incision.   Vision Baseline Vision/History: Wears glasses Wears Glasses: At all times       Perception     Praxis      Pertinent Vitals/Pain Pain Assessment: 0-10 Pain Score: 4  Pain Location: back Pain Descriptors / Indicators: Aching Pain Intervention(s): Monitored during session;Premedicated before session;Repositioned     Hand Dominance Right   Extremity/Trunk Assessment Upper Extremity Assessment Upper Extremity Assessment: Overall WFL for tasks assessed   Lower Extremity Assessment Lower Extremity Assessment: Defer to PT evaluation RLE Deficits / Details: grossly 3/5, reports R thigh to be numb but no longer the medial or posterior part RLE Coordination: decreased gross motor(pt with bilat drop foot (baseline))   Cervical / Trunk Assessment Cervical / Trunk Assessment: Other exceptions Cervical / Trunk Exceptions: x2 previous back surgery with one wash out per patient   Communication Communication Communication: No difficulties   Cognition Arousal/Alertness: Awake/alert Behavior During Therapy: WFL for tasks assessed/performed;Anxious Overall Cognitive Status: Within Functional Limits for tasks assessed                                 General Comments: pt mildly anxious regarding onset of worse pain with movement but participated in all therapy   General Comments  educated on clean linen use each shower    Exercises     Shoulder Instructions      Home Living Family/patient expects to be discharged to:: Private residence Living Arrangements: Children Available Help at Discharge: Family;Available 24 hours/day Type of Home: House Home Access: Stairs to enter [employer] of Steps: 1 Entrance  Stairs-Rails: None Home Layout: One level     Bathroom Shower/Tub: Teacher, early years/pre: Standard     Home Equipment: None          Prior Functioning/Environment Level of Independence: Independent        Comments: drives        OT Problem List: Decreased activity tolerance;Decreased knowledge of precautions;Decreased knowledge of use of DME or AE;Decreased strength;Pain;Obesity;Impaired balance (sitting and/or standing)      OT Treatment/Interventions: Self-care/ADL training;Therapeutic activities;Therapeutic exercise;Neuromuscular education;Energy conservation;DME and/or AE instruction;Patient/family education;Balance training    OT Goals(Current goals can be found in the care plan section) Acute Rehab OT Goals Patient Stated Goal: to get stronger OT Goal Formulation: With patient Time For Goal Achievement: 10/17/19 Potential to Achieve Goals: Good  OT Frequency: Min 3X/week   Barriers to D/C:            Co-evaluation              AM-PAC OT "6 Clicks" Daily Activity     Outcome Measure Help from another person eating meals?: None Help from another person taking care of personal grooming?: None Help from another person toileting, which includes using toliet, bedpan, or urinal?: A Lot  Help from another person bathing (including washing, rinsing, drying)?: A Lot Help from another person to put on and taking off regular upper body clothing?: None Help from another person to put on and taking off regular lower body clothing?: A Lot 6 Click Score: 18   End of Session Equipment Utilized During Treatment: Rolling walker;Back brace Nurse Communication: Mobility status;Precautions  Activity Tolerance: Patient tolerated treatment well Patient left: in bed;with call bell/phone within reach  OT Visit Diagnosis: Unsteadiness on feet (R26.81);Muscle weakness (generalized) (M62.81)                Time: [id number] OT Time Calculation (min): 27  min Charges:  OT General Charges $OT Visit: 1 Visit OT Evaluation $OT Eval Moderate Complexity: 1 Mod   Brynn, OTR/L  Acute Rehabilitation Services Pager: [phone] Office: [phone] .   Jeri Modena 10/03/2019, 11:59 AM

## 2019-10-03 NOTE — Progress Notes (Signed)
BP 122/75 (BP Location: Left Wrist)   Pulse 82   Temp 98.9 F (37.2 C) (Oral)   Resp 16   Ht 5\' 7"  (1.702 m)   Wt 109.8 kg   SpO2 94%   BMI 37.90 kg/m  Alert and oriented x 4, speech is clear and fluent Moving all extremities, improved strength in right lower extremity Wound is clean, dry, without signs of infection Possible discharge tomorrow

## 2019-10-03 NOTE — TOC Progression Note (Signed)
Transition of Care [facility]) - Progression Note    Patient Details  Name: Mary Chambers MRN: [id number] Date of Birth: 31-Dec-1970  Transition of Care [facility]) CM/SW [place], [place] Phone Number: 10/03/2019, 12:26 PM  Clinical Narrative:    CSW spoke with charity [facility] provider rep [place] with [employer]. They state they will have to speak with other agencies as they are "maxed out" at this time for charity. DME company [employer] is closed for holiday, will requested weekend follow up.   Expected Discharge Plan: [place] Barriers to Discharge: Continued Medical Work up  Expected Discharge Plan and Services Expected Discharge Plan: [place] In-house Referral: Clinical Social Work Discharge Planning Services: CM Consult, [place] Program, Medication Assistance Post Acute Care Choice: Durable Medical Equipment, Home Health           DME Arranged: 3-N-1, Environmental consultant DME Agency: [facility] HH Arranged: PT, OT HH Agency: [facility] Date [place]: 10/03/19 Time [place]: 1224 Representative spoke with at [place]: [place]   Readmission Risk Interventions Readmission Risk Prevention Plan 10/03/2019  Transportation Screening Complete  PCP or Specialist Appt within 3-5 Days Not Complete  Not Complete comments pt to arrange due to holiday  [place] or [place] Complete  Social Work Consult for [place] Planning/Counseling Complete  Palliative Care Screening Not Applicable  Medication Review Press photographer) Referral to Pharmacy  Some recent data might be hidden

## 2019-10-03 NOTE — Evaluation (Signed)
Physical Therapy Evaluation Patient Details Name: Mary Chambers MRN: [id number] DOB: 10-Nov-1970 Today's Date: 10/03/2019   History of Present Illness  Mary Chambers is a 49 y.o. female with history of HTN, cervical DDD, thoracic myelopathy and lumbar DDD s/p multiple cervical, thoracic and lumbar surgeries by Dr Aleene Davidson, who presents to the ED for the third time in the past three weeks for right leg pain & worsening numbness in RLE. Pt found to have large L1-2 disc herniation. Pt now s/p PLIF 1-2 on 12/31.  Clinical Impression  Pt admitted with above. Pt presenting with R LE weakness and numbness requiring minA for transfers and RW for ambulation. Ambulation limited by R LE weakness as well. Pt reports numbness in R LE to be improving. Pt with good home set up and support. Pt to benefit from HHPT to progress indep and strengthen R LE as pt was indep and working PTA. Acute PT to cont to follow.    Follow Up Recommendations Home health PT;Supervision/Assistance - 24 hour    Equipment Recommendations  Rolling walker with 5" wheels;3in1 (PT)    Recommendations for Other Services       Precautions / Restrictions Precautions Precautions: Back Precaution Booklet Issued: Yes (comment) Precaution Comments: pt with good understanding and recall from previous surgeries Required Braces or Orthoses: Spinal Brace Spinal Brace: Lumbar corset;Applied in supine position Restrictions Weight Bearing Restrictions: No      Mobility  Bed Mobility               General bed mobility comments: pt received upon BSC, did discuss log roll in/out of bed, pt reported "thats what the nurse and I did"  Transfers Overall transfer level: Needs assistance Equipment used: Rolling walker (2 wheeled) Transfers: Sit to/from Stand Sit to Stand: Min guard         General transfer comment: increased time, pt wiht increased trunk flexion, educated on minimizing trunk flexion however pt with weak R LE  and unable to really push up with LEs  Ambulation/Gait Ambulation/Gait assistance: Min assist Gait Distance (Feet): 20 Feet Assistive device: Rolling walker (2 wheeled) Gait Pattern/deviations: Step-to pattern;Decreased stride length;Decreased stance time - right;Decreased dorsiflexion - right;Decreased dorsiflexion - left Gait velocity: decreased Gait velocity interpretation: <1.8 ft/sec, indicate of risk for recurrent falls General Gait Details: pt with bilat drop foot, pt with decreased step height on the R and increased bilat UE dependency, at 52' pt became anxious stating "I"m getting shaky, I don't want my R leg to give out" pt seated and deferred ambulation again due to ""Im still shaky"  Stairs            Wheelchair Mobility    Modified Rankin (Stroke Patients Only)       Balance Overall balance assessment: Needs assistance Sitting-balance support: No upper extremity supported;Feet supported Sitting balance-Leahy Scale: Poor     Standing balance support: Bilateral upper extremity supported Standing balance-Leahy Scale: Poor Standing balance comment: dependent on bilat UEs on walker                             Pertinent Vitals/Pain Pain Assessment: 0-10 Pain Score: 6  Pain Location: back Pain Descriptors / Indicators: Aching Pain Intervention(s): Monitored during session    Home Living Family/patient expects to be discharged to:: Private residence Living Arrangements: Children(29yo dtr and 5yo son) Available Help at Discharge: Family;Available 24 hours/day(dtr works night shift but son will be  there) Type of Home: House Home Access: Stairs to enter Entrance Stairs-Rails: None Entrance Stairs-Number of Steps: 1(1 step up to get to side walk, level entry into home) Home Layout: One level Home Equipment: None      Prior Function Level of Independence: Independent         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   Dominant Hand:  Right    Extremity/Trunk Assessment   Upper Extremity Assessment Upper Extremity Assessment: Generalized weakness    Lower Extremity Assessment Lower Extremity Assessment: RLE deficits/detail RLE Deficits / Details: grossly 3/5, reports R thigh to be numb but no longer the medial or posterior part RLE Coordination: decreased gross motor(pt with bilat drop foot (baseline))    Cervical / Trunk Assessment Cervical / Trunk Assessment: Other exceptions Cervical / Trunk Exceptions: back surgery  Communication   Communication: No difficulties  Cognition Arousal/Alertness: Awake/alert Behavior During Therapy: WFL for tasks assessed/performed;Anxious Overall Cognitive Status: Within Functional Limits for tasks assessed                                 General Comments: pt mildly anxious regarding onset of worse pain with movement but participated in all therapy      General Comments General comments (skin integrity, edema, etc.): pt on BSC, with set up pt able to perform peri-care in sitting, pt min guard to push up from [facility]    Exercises     Assessment/Plan    PT Assessment Patient needs continued PT services  PT Problem List Decreased strength;Decreased activity tolerance;Decreased range of motion;Decreased balance;Decreased mobility;Decreased coordination;Decreased cognition;Decreased knowledge of use of DME;Decreased safety awareness;Decreased knowledge of precautions;Pain       PT Treatment Interventions DME instruction;Gait training;Stair training;Functional mobility training;Therapeutic activities;Therapeutic exercise;Balance training;Neuromuscular re-education    PT Goals (Current goals can be found in the Care Plan section)  Acute Rehab PT Goals Patient Stated Goal: to get stronger PT Goal Formulation: With patient Time For Goal Achievement: 10/17/19 Potential to Achieve Goals: Good    Frequency Min 5X/week   Barriers to discharge         Co-evaluation               AM-PAC PT "6 Clicks" Mobility  Outcome Measure Help needed turning from your back to your side while in a flat bed without using bedrails?: A Little Help needed moving from lying on your back to sitting on the side of a flat bed without using bedrails?: A Little Help needed moving to and from a bed to a chair (including a wheelchair)?: A Little Help needed standing up from a chair using your arms (e.g., wheelchair or bedside chair)?: A Little Help needed to walk in hospital room?: A Little Help needed climbing 3-5 steps with a railing? : A Lot 6 Click Score: 17    End of Session Equipment Utilized During Treatment: Gait belt;Back brace Activity Tolerance: Patient tolerated treatment well Patient left: in chair;with chair alarm set;with call bell/phone within reach Nurse Communication: Mobility status PT Visit Diagnosis: Unsteadiness on feet (R26.81);Other abnormalities of gait and mobility (R26.89);Muscle weakness (generalized) (M62.81);History of falling (Z91.81);Difficulty in walking, not elsewhere classified (R26.2);Pain Pain - part of body: (back)    Time: [id number] PT Time Calculation (min) (ACUTE ONLY): 34 min   Charges:   PT Evaluation $PT Eval Moderate Complexity: 1 Mod PT Treatments $Gait Training: 8-22 mins  Kittie Plater, PT, DPT Acute Rehabilitation Services Pager #: [phone] Office #: [phone]   Berline Lopes 10/03/2019, 9:52 AM

## 2019-10-03 NOTE — TOC Initial Note (Signed)
Transition of Care [facility]) - Initial/Assessment Note    Patient Details  Name: Mary Chambers MRN: [id number] Date of Birth: Jun 07, 1971  Transition of Care [facility]) CM/SW Contact:    Alexander [place], [place] Phone Number: 10/03/2019, 10:55 AM  Clinical Narrative:                 CSW spoke with pt on room phone. Introduced self, role, reason for call. Pt from an extended stay with her two children. She states they provide assistance and supervision as needed. Pt confirmed address and PCP information. Pt confirmed she is currently uninsured, agreeable to be assessed for charity DME and [facility] services. Pt pleasant and states no other questions or needs at this time. TOC team continues to follow.   Expected Discharge Plan: [place] Barriers to Discharge: Continued Medical Work up   Patient Goals and CMS Choice Patient states their goals for this hospitalization and ongoing recovery are:: to have home health CMS Medicare.gov Compare Post Acute Care list provided to:: Patient(pt charity care) Choice offered to / list presented to : Patient  Expected Discharge Plan and Services Expected Discharge Plan: [place] In-house Referral: Clinical Social Work Discharge Planning Services: CM Consult, [place] Program, Medication Assistance Post Acute Care Choice: Durable Medical Equipment, Home Health           DME Arranged: 3-N-1, Walker rolling DME Agency: [facility] HH Arranged: PT, OT  Prior Living Arrangements/Services   Lives with:: Adult Children Patient language and need for interpreter reviewed:: Yes(no needs) Do you feel safe going back to the place where you live?: Yes      Need for Family Participation in Patient Care: Yes (Comment)(supervision/assistance as needed) Care giver support system in place?: Yes (comment)(pt children)   Criminal Activity/Legal Involvement Pertinent to Current Situation/Hospitalization: No - Comment as needed  Activities of  Daily Living Home Assistive Devices/Equipment: Eyeglasses ADL Screening (condition at time of admission) Patient's cognitive ability adequate to safely complete daily activities?: Yes Is the patient deaf or have difficulty hearing?: No Does the patient have difficulty seeing, even when wearing glasses/contacts?: No Does the patient have difficulty concentrating, remembering, or making decisions?: No Patient able to express need for assistance with ADLs?: Yes Does the patient have difficulty dressing or bathing?: Yes Independently performs ADLs?: No Communication: Independent Dressing (OT): Needs assistance Is this a change from baseline?: Change from baseline, expected to last >3 days Grooming: Needs assistance Is this a change from baseline?: Change from baseline, expected to last >3 days Feeding: Independent Bathing: Needs assistance Is this a change from baseline?: Change from baseline, expected to last >3 days Toileting: Needs assistance Is this a change from baseline?: Change from baseline, expected to last >3days In/Out Bed: Needs assistance Is this a change from baseline?: Change from baseline, expected to last >3 days Walks in Home: Needs assistance Is this a change from baseline?: Change from baseline, expected to last >3 days Does the patient have difficulty walking or climbing stairs?: Yes(secondary to weakness) Weakness of Legs: Right Weakness of Arms/Hands: None  Permission Sought/Granted Permission sought to share information with : Family Supports Permission granted to share information with : Yes, Verbal Permission Granted  Share Information with NAME: Sherren Mocha  Permission granted to share info w AGENCY: [place] granted to share info w Relationship: daughter  Permission granted to share info w Contact Information: [phone]  Emotional Assessment Appearance:: (telephonic assessment) Attitude/Demeanor/Rapport: Engaged, Gracious(telephonic  assessment) Affect (typically observed): Adaptable, Accepting, Appropriate, Pleasant(telephonic assessment) Orientation: : Oriented to Self, Oriented to Place, Oriented to  Time, Oriented to Situation Alcohol / Substance Use: Not Applicable Psych Involvement: No (comment)  Admission diagnosis:  Lumbar spinal stenosis [M48.061] Cauda equina injury without spinal bone injury, initial encounter ([place]) [id number]] Herniated lumbar disc without myelopathy [M51.26] Patient Active Problem List   Diagnosis Date Noted  . Herniated lumbar disc without myelopathy 10/02/2019  . Lumbar spinal stenosis 10/01/2019  . Pain in left foot 03/29/2018  . Arthritis 02/13/2018  . Chronic back pain 02/13/2018  . Migraines 02/13/2018  . Morbid obesity with BMI of 40.0-44.9, adult ([place]) 02/13/2018  . Synovial cyst of lumbar spine 10/15/2017  . Osteoarthritis of knee 07/13/2017  . Lumbar disc disease with radiculopathy 08/01/2016  . Fatigue 05/04/2016  . Headache 05/04/2016  . Numbness of foot 05/04/2016  . 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 Arthrodesis status 02/02/2016  . Chronic pain syndrome 02/02/2016  . Elevated white blood cell count 12/01/2015  . Lumbar stenosis with neurogenic claudication 05/07/2015  . Bilateral leg edema 04/21/2015  . Carpal tunnel syndrome of right wrist 12/14/2014  . Renal atrophy, left 01/23/2014  . Achilles tendonitis 01/16/2014  . Right lower quadrant pain 12/18/2013  . Hypertension, benign 06/03/2010  . Urinary incontinence 06/03/2010  . Cervical disc disease 09/09/2009  . 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   PCP:  Jeraldine Loots, MD Pharmacy:   [id number] [place], [place] [id number] [place] [id number] Phone: [phone] Fax: [phone]   Readmission Risk Interventions Readmission Risk Prevention Plan 10/03/2019  Transportation Screening Complete  PCP or Specialist Appt within 3-5 Days  Not Complete  Not Complete comments pt to arrange due to holiday  [place] or [place] Complete  Social Work Consult for [place] Planning/Counseling Complete  Palliative Care Screening Not Applicable  Medication Review (RN Care Manager) Referral to Pharmacy  Some recent data might be hidden

## 2019-10-03 NOTE — Social Work (Signed)
CSW acknowledging consult for HHPT/OT/SLP and DME. Current therapy recommendations are for [facility] PT/OT and DME. Will need orders for these services and equipment. Due to pt lack of insurance will need to be assessed for charity services.   Westley Hummer, MSW, [place] Work

## 2019-10-04 NOTE — Progress Notes (Addendum)
Physical Therapy Treatment Patient Details Name: Mary Chambers MRN: [id number] DOB: 09-19-1971 Today's Date: 10/04/2019    History of Present Illness Mary Chambers is a 49 y.o. female with history of HTN, cervical DDD, thoracic myelopathy and lumbar DDD s/p multiple cervical, thoracic and lumbar surgeries by Dr Aleene Davidson, who presents to the ED for the third time in the past three weeks for right leg pain & worsening numbness in RLE. Pt found to have large L1-2 disc herniation. Pt now s/p PLIF 1-2 on 12/31.    PT Comments    Pt making steady progress towards physical therapy goals, reporting improved strength and sensation in RLE. Ambulating 10 feet x 30 feet with walker and min guard assist. Pt with increased bilateral foot clearance today. Rest of session focused on seated strengthening exercises. Pt continues with gait abnormalities, weakness, and balance impairments. D/c plan remains appropriate.     Follow Up Recommendations  Home health PT;Supervision/Assistance - 24 hour     Equipment Recommendations  Rolling walker with 5" wheels;3in1 (PT) (bariatric)   Recommendations for Other Services       Precautions / Restrictions Precautions Precautions: Back Precaution Booklet Issued: Yes (comment) Required Braces or Orthoses: Spinal Brace Spinal Brace: Lumbar corset;Applied in supine position Restrictions Weight Bearing Restrictions: No    Mobility  Bed Mobility Overal bed mobility: Needs Assistance Bed Mobility: Rolling;Sit to Supine Rolling: Supervision     Sit to supine: Mod assist;Supervision   General bed mobility comments: Good log roll technique; heavy use of bed rail and HOB elevated. No physical assist required  Transfers Overall transfer level: Needs assistance Equipment used: Rolling walker (2 wheeled) Transfers: Sit to/from Stand Sit to Stand: Min guard;Min assist         General transfer comment: Min guard to stand from edge of bed and recliner;  minA to stand from toilet with use of momentum from low surface. Cues for hand placement  Ambulation/Gait Ambulation/Gait assistance: Min guard Gait Distance (Feet): 40 Feet(10, 30) Assistive device: Rolling walker (2 wheeled) Gait Pattern/deviations: Decreased stride length;Decreased stance time - right;Decreased dorsiflexion - right;Decreased dorsiflexion - left;Step-to pattern;Step-through pattern Gait velocity: decreased   General Gait Details: Improved bilateral foot clearance; cues for decreased right foot external rotation. Pt with decreased bilateral heel strike at initial contact   Stairs             Wheelchair Mobility    Modified Rankin (Stroke Patients Only)       Balance Overall balance assessment: Needs assistance Sitting-balance support: No upper extremity supported;Feet supported Sitting balance-Leahy Scale: Fair     Standing balance support: Bilateral upper extremity supported;During functional activity Standing balance-Leahy Scale: Poor Standing balance comment: reliant on external support                            Cognition Arousal/Alertness: Awake/alert Behavior During Therapy: WFL for tasks assessed/performed;Anxious Overall Cognitive Status: Within Functional Limits for tasks assessed                                        Exercises General Exercises - Lower Extremity Long Arc Quad: Both;10 reps;Seated Hip Flexion/Marching: Both;10 reps;Seated Heel Raises: Both;20 reps;Seated Other Exercises Other Exercises: Seated: Bilateral ankle AROM "ABC's"    General Comments        Pertinent Vitals/Pain Pain Assessment: Faces Faces  Pain Scale: Hurts a little bit Pain Location: back Pain Descriptors / Indicators: Operative site guarding Pain Intervention(s): Monitored during session;Premedicated before session    Home Living                      Prior Function            PT Goals (current goals can  now be found in the care plan section) Acute Rehab PT Goals Patient Stated Goal: to get stronger Potential to Achieve Goals: Good Progress towards PT goals: Progressing toward goals    Frequency    Min 5X/week      PT Plan Current plan remains appropriate    Co-evaluation              AM-PAC PT "6 Clicks" Mobility   Outcome Measure  Help needed turning from your back to your side while in a flat bed without using bedrails?: None Help needed moving from lying on your back to sitting on the side of a flat bed without using bedrails?: A Little Help needed moving to and from a bed to a chair (including a wheelchair)?: A Little Help needed standing up from a chair using your arms (e.g., wheelchair or bedside chair)?: A Little Help needed to walk in hospital room?: A Little Help needed climbing 3-5 steps with a railing? : A Lot 6 Click Score: 18    End of Session Equipment Utilized During Treatment: Gait belt;Back brace Activity Tolerance: Patient tolerated treatment well Patient left: in chair;with call bell/phone within reach;with chair alarm set Nurse Communication: Mobility status PT Visit Diagnosis: Unsteadiness on feet (R26.81);Other abnormalities of gait and mobility (R26.89);Muscle weakness (generalized) (M62.81);History of falling (Z91.81);Difficulty in walking, not elsewhere classified (R26.2);Pain Pain - part of body: (back)     Time: [id number] PT Time Calculation (min) (ACUTE ONLY): 35 min  Charges:  $Gait Training: 8-22 mins $Therapeutic Exercise: 8-22 mins                     Mary Chambers, PT, DPT Acute Rehabilitation Services Pager [phone] Office [phone]    Willy Eddy 10/04/2019, 12:55 PM

## 2019-10-04 NOTE — Progress Notes (Signed)
NEUROSURGERY PROGRESS NOTE  Doing well. Complains of appropriate back soreness. Right leg strength seemed to improve a little since preop but still some hip flexor weakness. Would like some more therapy today and possible discharge home tomorrow. Continue pain management  Temp:  [97.7 F (36.5 C)-98.9 F (37.2 C)] 98.1 F (36.7 C) (01/02 0850) Pulse Rate:  [69-85] 69 (01/02 0850) Resp:  [16-18] 16 (01/02 0850) BP: (122-147)/(73-86) 124/75 (01/02 0850) SpO2:  [94 %-100 %] 100 % (01/02 0850)   Eleonore Chiquito, NP 10/04/2019 9:57 AM

## 2019-10-05 MED ORDER — HYDROCODONE-ACETAMINOPHEN 5-325 MG PO TABS: 1.0000 | ORAL_TABLET | ORAL | 0 refills | Status: DC | PRN

## 2019-10-05 MED ORDER — TIZANIDINE HCL 4 MG PO TABS: 4.0000 mg | ORAL_TABLET | Freq: Four times a day (QID) | ORAL | 0 refills | Status: DC | PRN

## 2019-10-05 NOTE — Progress Notes (Signed)
Occupational Therapy Treatment Patient Details Name: Mary Chambers MRN: [id number] DOB: 01-Oct-1971 Today's Date: 10/05/2019    History of present illness Mary Chambers is a 49 y.o. female with history of HTN, cervical DDD, thoracic myelopathy and lumbar DDD s/p multiple cervical, thoracic and lumbar surgeries by Dr Aleene Davidson, who presents to the ED for the third time in the past three weeks for right leg pain & worsening numbness in RLE. Pt found to have large L1-2 disc herniation. Pt now s/p PLIF 1-2 on 12/31.   OT comments  Pt. Seen for skilled OT treatment session.  Pt. Able to completed bed mobility S.  Amb. To/from b.room for toileting including peri care and grooming at sink min guard A.  LB dressing without any A/E needs.  Note d/c likely later today.    Follow Up Recommendations  Home health OT    Equipment Recommendations  3 in 1 bedside commode;Other (comment)    Recommendations for Other Services      Precautions / Restrictions Precautions Precautions: Back Precaution Comments: pt with good understanding and recall from previous surgeries Spinal Brace: Lumbar corset;Applied in supine position;Other (comment) Spinal Brace Comments: pt. reports she applies in seated position, and applied seated eob today       Mobility Bed Mobility Overal bed mobility: Needs Assistance Bed Mobility: Rolling;Sidelying to Sit Rolling: Supervision Sidelying to sit: Supervision       General bed mobility comments: Good log roll technique; heavy use of bed rail and HOB flat. No physical assist required  Transfers                      Balance                                           ADL either performed or assessed with clinical judgement   ADL Overall ADL's : Needs assistance/impaired     Grooming: Wash/dry hands;Standing;Supervision/safety         Lower Body Bathing Details (indicate cue type and reason): simulated in seated position while  donning slippers, would be able to reach B LEs Upper Body Dressing : Set up;Sitting Upper Body Dressing Details (indicate cue type and reason): don brace seated eob Lower Body Dressing: Sitting/lateral leans Lower Body Dressing Details (indicate cue type and reason): able to cross each leg fully over each knee while maintaining precautions no a/e needs noted Toilet Transfer: Min Forensic psychologist Details (indicate cue type and reason): educated on the 3 uses of a 3n1 and how to place over toilet at home Toileting- Water quality scientist and Hygiene: Min guard;Sitting/lateral lean Toileting - Clothing Manipulation Details (indicate cue type and reason): will need further education on toilet aide. Introduced this session and discussed wet wipes-able to wipe without breaking precautions     Functional mobility during ADLs: Min guard;Rolling walker General ADL Comments: pt. moving well. "happy tears" at end of session as she was very proud of her abilities and how she performed during session.     Vision       Perception     Praxis      Cognition Arousal/Alertness: Awake/alert Behavior During Therapy: WFL for tasks assessed/performed;Anxious Overall Cognitive Status: Within Functional Limits for tasks assessed  Exercises     Shoulder Instructions       General Comments      Pertinent Vitals/ Pain       Pain Assessment: 0-10 Pain Score: 5  Pain Location: back Pain Descriptors / Indicators: Aching Pain Intervention(s): Limited activity within patient's tolerance;Monitored during session;Repositioned  Home Living                                          Prior Functioning/Environment              Frequency  Min 3X/week        Progress Toward Goals  OT Goals(current goals can now be found in the care plan section)  Progress towards OT goals: Progressing toward goals     Plan       Co-evaluation                 AM-PAC OT "6 Clicks" Daily Activity     Outcome Measure   Help from another person eating meals?: None Help from another person taking care of personal grooming?: None Help from another person toileting, which includes using toliet, bedpan, or urinal?: A Lot Help from another person bathing (including washing, rinsing, drying)?: A Lot Help from another person to put on and taking off regular upper body clothing?: None Help from another person to put on and taking off regular lower body clothing?: A Lot 6 Click Score: 18    End of Session Equipment Utilized During Treatment: Rolling walker;Back brace  OT Visit Diagnosis: Unsteadiness on feet (R26.81);Muscle weakness (generalized) (M62.81)   Activity Tolerance Patient tolerated treatment well   Patient Left in chair;with call bell/phone within reach   Nurse Communication          Time: [id number] OT Time Calculation (min): 19 min  Charges: OT General Charges $OT Visit: 1 Visit OT Treatments $Self Care/Home Management : 8-22 mins  Rico Junker M, COTA/L 10/05/2019, 10:15 AM

## 2019-10-05 NOTE — TOC Transition Note (Signed)
Transition of Care [facility]) - CM/SW Discharge Note   Patient Details  Name: Mary Chambers MRN: [id number] Date of Birth: 08-09-1971  Transition [facility]) CM/SW Contact:  Claudie Leach, RN 10/05/2019, 11:00 AM   Clinical Narrative:    Pt to dc home.  Home health services are not arranged due to lack of insurance.  [employer] (designated [facility] administrator) is unable to accept patient into charity program, and no other charity agency accepted referral.    Patient would like to be referred to Outpatient therapy.  Referral placed.    Bariatric 3n1 and RW arranged and delivered to room .   Final next level of care: Home/Self Care Barriers to Discharge: No Barriers Identified   Patient Goals and CMS Choice Patient states their goals for this hospitalization and ongoing recovery are:: to have home health CMS Medicare.gov Compare Post Acute Care list provided to:: Patient(pt charity care) Choice offered to / list presented to : Patient  Discharge Placement                       Discharge Plan and Services In-house Referral: Clinical Social Work Discharge Planning Services: CM Consult, [place] Program, Medication Assistance Post Acute Care Choice: Durable Medical Equipment, Home Health          DME Arranged: 3-N-1, [place]ford Rile DME Agency: [employer] Date DME Agency Contacted: 10/05/19 Time DME Agency Contacted: 1000 Representative spoke with at DME Agency: Maribel    Readmission Risk Interventions Readmission Risk Prevention Plan 10/03/2019  Transportation Screening Complete  PCP or Specialist Appt within 3-5 Days Not Complete  Not Complete comments pt to arrange due to holiday  [place] or [place] Complete  Social Work Consult for [place] Planning/Counseling Complete  Palliative Care Screening Not Applicable  Medication Review Press photographer) Referral to Pharmacy  Some recent data might be hidden

## 2019-10-05 NOTE — Progress Notes (Signed)
Patient will need a bariatric 3 in 1 for her body habitus

## 2019-10-05 NOTE — Discharge Summary (Signed)
Physician Discharge Summary  Patient ID: Mary Chambers MRN: [id number] DOB/AGE: 1971/07/13 49 y.o.  Admit date: 10/01/2019 Discharge date: 10/05/2019  Admission Diagnoses: Lumbar spinal stenosis, herniated lumbar disc, profound right leg weakness, spondylosis, disc degeneration, lumbago, radiculopathy L 12 level   Discharge Diagnoses: same   Discharged Condition: good  Hospital Course: The patient was admitted on 10/01/2019 and taken to the operating room where the patient underwent PLIF L1-3. The patient tolerated the procedure well and was taken to the recovery room and then to the back in stable condition. The hospital course was routine. There were no complications. The wound remained clean dry and intact. Pt had appropriate back soreness. No complaints of leg pain or new N/T/W. The patient remained afebrile with stable vital signs, and tolerated a regular diet. The patient continued to increase activities, and pain was well controlled with oral pain medications.   Consults: None  Significant Diagnostic Studies:  Results for orders placed or performed during the hospital encounter of 10/01/19  Surgical PCR screen   Specimen: Nasal Mucosa; Nasal Swab  Result Value Ref Range   MRSA, PCR NEGATIVE NEGATIVE   Staphylococcus aureus NEGATIVE NEGATIVE  Basic metabolic panel  Result Value Ref Range   Sodium 137 135 - 145 mmol/L   Potassium 4.2 3.5 - 5.1 mmol/L   Chloride 104 98 - 111 mmol/L   CO2 22 22 - 32 mmol/L   Glucose, Bld 129 (H) 70 - 99 mg/dL   BUN 19 6 - 20 mg/dL   Creatinine, Ser 0.76 0.44 - 1.00 mg/dL   Calcium 9.0 8.9 - 10.3 mg/dL   GFR calc non Af Amer >60 >60 mL/min   GFR calc Af Amer >60 >60 mL/min   Anion gap 11 5 - 15  CBC with Differential  Result Value Ref Range   WBC 14.5 (H) 4.0 - 10.5 K/uL   RBC 4.95 3.87 - 5.11 MIL/uL   Hemoglobin 13.9 12.0 - 15.0 g/dL   HCT 44.0 36.0 - 46.0 %   MCV 88.9 80.0 - 100.0 fL   MCH 28.1 26.0 - 34.0 pg   MCHC 31.6 30.0 -  36.0 g/dL   RDW 14.2 11.5 - 15.5 %   Platelets 336 150 - 400 K/uL   nRBC 0.0 0.0 - 0.2 %   Neutrophils Relative % 88 %   Neutro Abs 12.7 (H) 1.7 - 7.7 K/uL   Lymphocytes Relative 10 %   Lymphs Abs 1.5 0.7 - 4.0 K/uL   Monocytes Relative 1 %   Monocytes Absolute 0.2 0.1 - 1.0 K/uL   Eosinophils Relative 0 %   Eosinophils Absolute 0.0 0.0 - 0.5 K/uL   Basophils Relative 0 %   Basophils Absolute 0.0 0.0 - 0.1 K/uL   Immature Granulocytes 1 %   Abs Immature Granulocytes 0.11 (H) 0.00 - 0.07 K/uL  Urinalysis, Routine w reflex microscopic  Result Value Ref Range   Color, Urine YELLOW YELLOW   APPearance CLEAR CLEAR   Specific Gravity, Urine 1.013 1.005 - 1.030   pH 6.0 5.0 - 8.0   Glucose, UA NEGATIVE NEGATIVE mg/dL   Hgb urine dipstick NEGATIVE NEGATIVE   Bilirubin Urine NEGATIVE NEGATIVE   Ketones, ur NEGATIVE NEGATIVE mg/dL   Protein, ur NEGATIVE NEGATIVE mg/dL   Nitrite POSITIVE (A) NEGATIVE   Leukocytes,Ua TRACE (A) NEGATIVE   RBC / HPF 0-5 0 - 5 RBC/hpf   WBC, UA 6-10 0 - 5 WBC/hpf   Bacteria, UA RARE (A) NONE SEEN  Mucus PRESENT   HIV Antibody (routine testing w rflx)  Result Value Ref Range   HIV Screen 4th Generation wRfx NON REACTIVE NON REACTIVE  POC urine preg, ED  Result Value Ref Range   Preg Test, Ur NEGATIVE NEGATIVE  POC SARS Coronavirus 2 Ag-ED - Nasal Swab (BD Veritor Kit)  Result Value Ref Range   SARS Coronavirus 2 Ag NEGATIVE NEGATIVE    DG Lumbar Spine 2-3 Views  Result Date: 10/02/2019 CLINICAL DATA:  Surgical posterior fusion of lumbar spine. EXAM: LUMBAR SPINE - 2-3 VIEW; DG C-ARM 1-60 MIN FLUOROSCOPY TIME:  37 seconds. COMPARISON:  MRI of October 01, 2019. FINDINGS: Three intraoperative fluoroscopic images were obtained of the lumbar spine. These images demonstrate bilateral intrapedicular screw placement involving an upper lumbar vertebral body. IMPRESSION: Fluoroscopic guidance provided during surgical posterior fusion of lumbar spine.  Electronically Signed   By: Marijo Conception M.D.   On: 10/02/2019 17:33   DG Lumbar Spine Complete  Result Date: 09/22/2019 CLINICAL DATA:  Pain following fall EXAM: LUMBAR SPINE - COMPLETE 4+ VIEW COMPARISON:  April 17, 2015 FINDINGS: Frontal, lateral, spot lumbosacral lateral, and bilateral oblique views were obtained. There are 5 non rib-bearing lumbar type vertebral bodies. There is lumbar levoscoliosis with rotatory component. Patient has had previous laminectomies at L2 and L3 with posterior screw and plate fixation at L2 and L3. There is also posterior screw and plate fixation at W11 and T11. Support hardware in these areas appears intact. There is no fracture. There is 4 mm of anterolisthesis of L3 on L4. No other spondylolisthesis evident. There is moderate disc space narrowing at all levels, most notably at L2-3 and L3-4. There is facet osteoarthritic change at L3-4, L4-5, and L5-S1 bilaterally. There is a 3 mm calcification either in or overlying the lower left pelvis. IMPRESSION: Postoperative changes at T10 and T11 as well as at L2 and L3 with support hardware in these areas intact. No fracture evident. Mild spondylolisthesis at L3-4. No other spondylolisthesis. Osteoarthritic change noted at multiple levels. There is a degree of levoscoliosis with rotatory component. Question small calculus lower pole left kidney. Electronically Signed   By: Lowella Grip III M.D.   On: 09/22/2019 12:14   DG Shoulder Right  Result Date: 09/05/2019 CLINICAL DATA:  49 year old female with right shoulder pain. EXAM: RIGHT SHOULDER - 2+ VIEW COMPARISON:  Chest radiograph dated 09/29/2015. FINDINGS: There is no acute fracture or dislocation. Mild arthritic changes of the right shoulder with subcortical cystic changes of the lateral humeral head and bone spurring from undersurface of the acromion. A 13 mm sclerotic focus in the proximal right humeral diaphysis is suboptimally characterized on this radiograph, but  does not demonstrate aggressive features. Direct comparison with prior images, if available, is recommended. The soft tissues are unremarkable. IMPRESSION: 1. No acute fracture or dislocation. 2. Mild arthritic changes of the right shoulder. Electronically Signed   By: Anner Crete M.D.   On: 09/05/2019 16:22   DG Ankle 2 Views Right  Result Date: 09/05/2019 CLINICAL DATA:  RIGHT ankle pain post fall EXAM: RIGHT ANKLE - 2 VIEW COMPARISON:  None FINDINGS: Osseous mineralization normal. Joint spaces preserved. Plantar and Achilles insertion calcaneal spurs. Scattered soft tissue swelling. No acute fracture, dislocation, or bone destruction. IMPRESSION: No acute osseous abnormalities. Calcaneal spurring. Electronically Signed   By: Lavonia Dana M.D.   On: 09/05/2019 16:26   MR LUMBAR SPINE WO CONTRAST  Result Date: 10/01/2019 CLINICAL DATA:  Low back  pain, progressive neurologic deficit. Additional history provided: Status post fall 3 weeks ago, worsening right leg weakness since 3 a.m. today unable to lift right leg, unable to ambulate, numbness right leg. EXAM: MRI LUMBAR SPINE WITHOUT CONTRAST TECHNIQUE: Multiplanar, multisequence MR imaging of the lumbar spine was performed. No intravenous contrast was administered. COMPARISON:  Lumbar spine radiographs 09/22/2019 FINDINGS: Segmentation: Spinal numbering will remain consistent with prior examinations. The lowest well-formed intervertebral disc is designated L5-S1. Alignment: Lumbar levocurvature. Trace T12-L1 and L1-L2 retrolisthesis. Vertebrae: Vertebral body height is maintained. Incompletely imaged posterior spinal fusion construct at T10-T11. Sequela of prior L2-L3 posterior decompression with posterior spinal fusion construct at this level. Susceptibility artifact arising from the fusion hardware slightly limits evaluation of marrow signal at these levels. No appreciable marrow edema or suspicious osseous lesion. L1 vertebral body hemangioma. Conus  medullaris and cauda equina: Conus extends to the T12 level. No signal abnormality within the visualized distal spinal cord. Paraspinal and other soft tissues: No abnormality identified within included portions of the abdomen/retroperitoneum. Postsurgical changes to the paraspinal soft tissues. Marked atrophy of the lower lumbar paraspinal musculature. Disc levels: Mild multilevel disc degeneration. T12-L1: Small disc bulge. Superimposed tiny cranially migrated central disc extrusion. No significant spinal canal stenosis or neural foraminal narrowing. L1-L2: Disc bulge. Superimposed moderate-sized right center/subarticular disc extrusion with mild caudal migration. Facet/ligamentum flavum hypertrophy. Severe spinal canal stenosis with encroachment upon right greater than left descending cauda equina nerve roots. Redundancy of the cauda equina nerve roots cephalad to this level. Mild right neural foraminal narrowing. L2-L3: Sequela of prior posterior decompression and fusion. Disc bulge with endplate spurring. Spinal canal widely patent. No significant foraminal stenosis. L3-L4: Sequela of prior posterior decompression. Small disc bulge with endplate spurring. Facet hypertrophy. Spinal canal widely patent. No significant neural foraminal narrowing. L4-L5: Disc bulge asymmetric to the left with endplate spurring. Moderate facet arthrosis with ligamentum flavum hypertrophy. 8 mm posteriorly projecting synovial facet cyst on the left. Moderate left subarticular stenosis with crowding of the descending left L5 nerve root. Mild relative central canal narrowing. Mild/moderate left neural foraminal narrowing. L5-S1: Small disc bulge. Mild facet arthrosis/ligamentum flavum hypertrophy. No significant spinal canal stenosis or neural foraminal narrowing. These results were called by telephone at the time of interpretation on 10/01/2019 at 7:59 am to provider Dr. Rona Ravens, who verbally acknowledged these results. IMPRESSION: Spinal  numbering will remain consistent with previous examinations. Sequela of prior posterior decompression at L2-L3 and L3-L4 with L2-L3 posterior spinal fusion construct. Spinal canal widely patent at these levels. Incompletely imaged T10-T11 posterior spinal fusion construct. At L1-L2, a moderate-sized right center/subarticular caudally migrated disc extrusion contributes to severe spinal canal stenosis with encroachment upon right greater than left cauda equina nerve roots. There is redundancy of the cauda equina nerve roots cephalad to this level. Mild right neural foraminal narrowing. At L4-L5, multifactorial left subarticular stenosis with crowding of the descending left L5 nerve root. Mild central canal narrowing and mild/moderate left neural foraminal narrowing also present at this level. Electronically Signed   By: Kellie Simmering DO   On: 10/01/2019 07:59   DG Knee Complete 4 Views Right  Result Date: 09/05/2019 CLINICAL DATA:  49 year old female with fall and right knee pain. EXAM: RIGHT KNEE - COMPLETE 4+ VIEW COMPARISON:  None. FINDINGS: There is no acute fracture or dislocation. Mild osteopenia and mild arthritic changes with narrowing of the patellofemoral and medial compartment and bone spurring. No significant joint effusion. The soft tissues are unremarkable. IMPRESSION: 1.  No acute fracture or dislocation. 2. Mild arthritic changes. Electronically Signed   By: Anner Crete M.D.   On: 09/05/2019 16:24   DG C-Arm 1-60 Min  Result Date: 10/02/2019 CLINICAL DATA:  Surgical posterior fusion of lumbar spine. EXAM: LUMBAR SPINE - 2-3 VIEW; DG C-ARM 1-60 MIN FLUOROSCOPY TIME:  37 seconds. COMPARISON:  MRI of October 01, 2019. FINDINGS: Three intraoperative fluoroscopic images were obtained of the lumbar spine. These images demonstrate bilateral intrapedicular screw placement involving an upper lumbar vertebral body. IMPRESSION: Fluoroscopic guidance provided during surgical posterior fusion of  lumbar spine. Electronically Signed   By: Marijo Conception M.D.   On: 10/02/2019 17:33   DG Hip Unilat  With Pelvis 2-3 Views Right  Result Date: 09/22/2019 CLINICAL DATA:  Pain EXAM: DG HIP (WITH OR WITHOUT PELVIS) 2-3V RIGHT COMPARISON:  None. FINDINGS: Frontal pelvis as well as frontal and lateral right hip images were obtained. No fracture or dislocation. There is moderate symmetric narrowing of each hip joint. No erosive change. Sacroiliac joints bilaterally appear normal. There is degenerative change on the left at L4-5. IMPRESSION: Symmetric narrowing of each hip joint. No fracture or dislocation. Osteoarthritic change noted in the lower lumbar region. Electronically Signed   By: Lowella Grip III M.D.   On: 09/22/2019 12:11   DG Hip Unilat  With Pelvis 2-3 Views Right  Result Date: 09/05/2019 CLINICAL DATA:  RIGHT hip pain post fall EXAM: DG HIP (WITH OR WITHOUT PELVIS) 2-3V RIGHT COMPARISON:  07/20/2014 FINDINGS: Osseous mineralization normal. Mild degenerative changes of the RIGHT hip joint with joint space narrowing and spurring. Minimal narrowing of LEFT hip joint. SI joints preserved. No acute fracture, dislocation, or bone destruction. Degenerative disc disease changes L4-L5. Prior fusion L2-L3. IMPRESSION: Degenerative changes of RIGHT hip and at L4-L5. No acute abnormalities. Electronically Signed   By: Lavonia Dana M.D.   On: 09/05/2019 16:24    Antibiotics:  Anti-infectives (From admission, onward)   Start     Dose/Rate Route Frequency Ordered Stop   10/02/19 2300  ceFAZolin (ANCEF) IVPB 2g/100 mL premix     2 g 200 mL/hr over 30 Minutes Intravenous Every 8 hours 10/02/19 1908 10/03/19 1059   10/02/19 1225  ceFAZolin (ANCEF) IVPB 2g/100 mL premix     2 g 200 mL/hr over 30 Minutes Intravenous 30 min pre-op 10/02/19 1225 10/02/19 1445   10/01/19 1400  cephALEXin (KEFLEX) capsule 500 mg     500 mg Oral Every 8 hours 10/01/19 1226        Discharge Exam: Blood pressure (!)  155/93, pulse 83, temperature 98 F (36.7 C), resp. rate (!) 2, height 5' 7"  (1.702 m), weight 109.8 kg, SpO2 99 %. Neurologic: Grossly normal Ambulating and voiding well, incision cdi  Discharge Medications:   Allergies as of 10/05/2019      Reactions   Baclofen Other (See Comments)   Doxycycline Itching   Percocet [oxycodone-acetaminophen] Hives   Septra [sulfamethoxazole-trimethoprim] Hives   Gabapentin Rash      Medication List    TAKE these medications   cetirizine 10 MG chewable tablet Commonly known as: ZYRTEC Chew 1 tablet (10 mg total) by mouth daily.   cholecalciferol 25 MCG (1000 UT) tablet Commonly known as: VITAMIN D3 Take 1,000 Units by mouth daily.   enalapril 10 MG tablet Commonly known as: VASOTEC Take 10 mg by mouth daily.   fluticasone 50 MCG/ACT nasal spray Commonly known as: FLONASE Place 2 sprays into both nostrils daily.  hydrochlorothiazide 25 MG tablet Commonly known as: HYDRODIURIL Take 25 mg by mouth daily.   HYDROcodone-acetaminophen 5-325 MG tablet Commonly known as: NORCO/VICODIN Take 1 tablet by mouth every 4 (four) hours as needed. What changed: Another medication with the same name was added. Make sure you understand how and when to take each.   HYDROcodone-acetaminophen 5-325 MG tablet Commonly known as: NORCO/VICODIN Take 1 tablet by mouth every 4 (four) hours as needed for moderate pain ((score 4 to 6)). What changed: You were already taking a medication with the same name, and this prescription was added. Make sure you understand how and when to take each.   multivitamin capsule Take 1 capsule by mouth daily.   naproxen sodium 220 MG tablet Commonly known as: ALEVE Take 220 mg by mouth 2 (two) times daily as needed (pain).   potassium chloride SA 20 MEQ tablet Commonly known as: KLOR-CON Take 20 mEq by mouth 2 (two) times daily.   tiZANidine 4 MG tablet Commonly known as: Zanaflex Take 1 tablet (4 mg total) by mouth  every 6 (six) hours as needed for muscle spasms.   Vitamin C 500 MG Caps Take 500 mg by mouth daily.            Durable Medical Equipment  (From admission, onward)         Start     Ordered   10/05/19 0856  DME 3-in-1  Once     10/05/19 0857   10/05/19 0856  DME Walker  Once    Question Answer Comment  Walker: With [place]   Patient needs a walker to treat with the following condition Encounter for postoperative care related to surgical joint fusion      10/05/19 0857          Disposition: home   Final Dx:  PLIF L1-3  Discharge Instructions     Remove dressing in 72 hours   Complete by: As directed    Call MD for:  difficulty breathing, headache or visual disturbances   Complete by: As directed    Call MD for:  hives   Complete by: As directed    Call MD for:  persistant dizziness or light-headedness   Complete by: As directed    Call MD for:  persistant nausea and vomiting   Complete by: As directed    Call MD for:  redness, tenderness, or signs of infection (pain, swelling, redness, odor or green/yellow discharge around incision site)   Complete by: As directed    Call MD for:  severe uncontrolled pain   Complete by: As directed    Call MD for:  temperature >100.4   Complete by: As directed    Diet - low sodium heart healthy   Complete by: As directed    Driving Restrictions   Complete by: As directed    No driving for 2 weeks, no riding in the car for 1 week   Increase activity slowly   Complete by: As directed    Lifting restrictions   Complete by: As directed    No lifting more than 8 lbs         Signed: Ocie Cornfield Undray Allman 10/05/2019, 8:59 AM

## 2019-10-06 MED FILL — Thrombin For Soln 5000 Unit: CUTANEOUS | Qty: 5000 | Status: AC

## 2019-10-10 MED FILL — Heparin Sodium (Porcine) Inj 1000 Unit/ML: INTRAMUSCULAR | Qty: 30 | Status: AC

## 2019-10-10 MED FILL — Sodium Chloride IV Soln 0.9%: INTRAVENOUS | Qty: 1000 | Status: AC

## 2020-04-04 ENCOUNTER — Other Ambulatory Visit: Payer: Self-pay

## 2020-04-04 ENCOUNTER — Ambulatory Visit
Admission: RE | Admit: 2020-04-04 | Discharge: 2020-04-04 | Disposition: A | Discharge status: Home / Self Care | Payer: Medicaid Other | Source: Ambulatory Visit | Attending: Emergency Medicine | Admitting: Emergency Medicine

## 2020-04-04 VITALS — BP 139/80 | HR 90 | Temp 98.2°F | Resp 18

## 2020-04-04 DIAGNOSIS — N3001 Acute cystitis with hematuria: Secondary | ICD-10-CM

## 2020-04-04 LAB — POCT URINALYSIS DIP (MANUAL ENTRY)
Bilirubin, UA: NEGATIVE
Blood, UA: NEGATIVE
Glucose, UA: NEGATIVE mg/dL
Ketones, POC UA: NEGATIVE mg/dL
Nitrite, UA: POSITIVE — AB
Protein Ur, POC: NEGATIVE mg/dL
Spec Grav, UA: >=1.03 — AB (ref 1.010–1.025)
Urobilinogen, UA: 0.2 E.U./dL
pH, UA: 6 (ref 5.0–8.0)

## 2020-04-04 MED ORDER — CEPHALEXIN 500 MG PO CAPS: 500.0000 mg | ORAL_CAPSULE | Freq: Two times a day (BID) | ORAL | 0 refills | Status: AC

## 2020-04-04 MED ORDER — FLUCONAZOLE 200 MG PO TABS: 200.0000 mg | ORAL_TABLET | Freq: Once | ORAL | 0 refills | Status: AC

## 2020-04-04 NOTE — Discharge Instructions (Addendum)
Take antibiotic twice daily with food. Important to drink plenty of water throughout the day. Return for worsening urinary symptoms, blood in urine, abdominal or back pain, fever. 

## 2020-04-04 NOTE — ED Triage Notes (Signed)
Pt presents to [facility] for assessment of 2-3 days of urinary frequency/urgency/pressure and RLQ abdominal pain.  Denies fevers.

## 2020-04-04 NOTE — ED Provider Notes (Signed)
[facility]    CSN: [id number] Arrival date & time: 04/04/20  1150      History   Chief Complaint Chief Complaint  Patient presents with  . Dysuria    HPI Mary Chambers is a 49 y.o. female with history of hypertension, obesity presenting for 3-day course of urinary frequency, urgency, lower abdominal pressure.  Has also noted intermittent RLQ abdominal pain.  States this has been chronic/stable since previous evaluation here in UC setting: Since seen for cauda equina with surgical intervention in hospital setting.  Postoperatively, doing well.  No saddle or anesthesia, lower leg weakness.  No vaginal discharge or pelvic pain.  No fever.   Past Medical History:  Diagnosis Date  . Hypertension   . Kidney stone     Patient Active Problem List   Diagnosis Date Noted  . Herniated lumbar disc without myelopathy 10/02/2019  . Lumbar spinal stenosis 10/01/2019  . Pain in left foot 03/29/2018  . Arthritis 02/13/2018  . Chronic back pain 02/13/2018  . Migraines 02/13/2018  . Morbid obesity with BMI of 40.0-44.9, adult ([place]) 02/13/2018  . Synovial cyst of lumbar spine 10/15/2017  . Osteoarthritis of knee 07/13/2017  . Lumbar disc disease with radiculopathy 08/01/2016  . Fatigue 05/04/2016  . Headache 05/04/2016  . Numbness of foot 05/04/2016  . 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 Arthrodesis status 02/02/2016  . Chronic pain syndrome 02/02/2016  . Elevated white blood cell count 12/01/2015  . Lumbar stenosis with neurogenic claudication 05/07/2015  . Bilateral leg edema 04/21/2015  . Carpal tunnel syndrome of right wrist 12/14/2014  . Renal atrophy, left 01/23/2014  . Achilles tendonitis 01/16/2014  . Right lower quadrant pain 12/18/2013  . Hypertension, benign 06/03/2010  . Urinary incontinence 06/03/2010  . Cervical disc disease 09/09/2009  . 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 ABDOMINAL  HYSTERECTOMY    . BACK SURGERY    . CARPAL TUNNEL RELEASE    . CHOLECYSTECTOMY    . LITHOTRIPSY    . neck fusion    . TUBAL LIGATION      OB History   No obstetric history on file.      Home Medications    Prior to Admission medications   Medication Sig Start Date End Date Taking? Authorizing Provider  Ascorbic Acid (VITAMIN C) 500 MG CAPS Take 500 mg by mouth daily.    [provider]  cephALEXin (KEFLEX) 500 MG capsule Take 1 capsule (500 mg total) by mouth 2 (two) times daily for 5 days. 04/04/20 04/09/20  Hall-Potvin, [place], PA-C  cetirizine (ZYRTEC) 10 MG chewable tablet Chew 1 tablet (10 mg total) by mouth daily. 05/06/19 06/05/19  Sharion Balloon, NP  cholecalciferol (VITAMIN D3) 25 MCG (1000 UT) tablet Take 1,000 Units by mouth daily.    [provider]  enalapril (VASOTEC) 10 MG tablet Take 10 mg by mouth daily.    [provider]  fluconazole (DIFLUCAN) 200 MG tablet Take 1 tablet (200 mg total) by mouth once for 1 dose. May repeat in 72 hours if needed 04/04/20 04/04/20  Hall-Potvin, [place], PA-C  hydrochlorothiazide (HYDRODIURIL) 25 MG tablet Take 25 mg by mouth daily.    [provider]  Multiple Vitamin (MULTIVITAMIN) capsule Take 1 capsule by mouth daily.     [provider]  naproxen sodium (ALEVE) 220 MG tablet Take 220 mg by mouth 2 (two) times daily as needed (pain).  [provider]  potassium chloride SA (K-DUR,KLOR-CON) 20 MEQ tablet Take 20 mEq by mouth 2 (two) times daily.    [provider]  tiZANidine (ZANAFLEX) 4 MG tablet Take 1 tablet (4 mg total) by mouth every 6 (six) hours as needed for muscle spasms. 10/05/19   Meyran, Ocie Cornfield, NP  diphenhydrAMINE (BENADRYL) 25 mg capsule Take by mouth. 03/10/16 06/21/19  [provider]  fluticasone (FLONASE) 50 MCG/ACT nasal spray Place 2 sprays into both nostrils daily. Patient not taking: Reported on 10/01/2019 09/08/19 04/04/20  Ok Edwards, PA-C   LYRICA 50 MG capsule Take 50 mg by mouth 3 (three) times daily. 04/15/18 06/07/19  [provider]    Family History Family History  Problem Relation Age of Onset  . Diabetes Mother   . Hypertension Mother   . Cancer Father   . Parkinson's disease Father     Social History Social History   Tobacco Use  . Smoking status: Never Smoker  . Smokeless tobacco: Never Used  Vaping Use  . Vaping Use: Never used  Substance Use Topics  . Alcohol use: No  . Drug use: Never     Allergies   Baclofen, Doxycycline, Percocet [oxycodone-acetaminophen], Septra [sulfamethoxazole-trimethoprim], and Gabapentin   Review of Systems As per HPI   Physical Exam Triage Vital Signs ED Triage Vitals  Enc Vitals Group     BP      Pulse      Resp      Temp      Temp src      SpO2      Weight      Height      Head Circumference      Peak Flow      Pain Score      Pain Loc      Pain Edu?      Excl. in [place]?    No data found.  Updated Vital Signs BP 139/80 (BP Location: Left Wrist)   Pulse 90   Temp 98.2 F (36.8 C) (Oral)   Resp 18   SpO2 96%   Visual Acuity Right Eye Distance:   Left Eye Distance:   Bilateral Distance:    Right Eye Near:   Left Eye Near:    Bilateral Near:     Physical Exam Constitutional:      General: She is not in acute distress. HENT:     Head: Normocephalic and atraumatic.  Eyes:     General: No scleral icterus.    Pupils: Pupils are equal, round, and reactive to light.  Cardiovascular:     Rate and Rhythm: Normal rate.  Pulmonary:     Effort: Pulmonary effort is normal.  Abdominal:     General: Bowel sounds are normal.     Palpations: Abdomen is soft.     Tenderness: There is no abdominal tenderness. There is no right CVA tenderness, left CVA tenderness or guarding.  Skin:    Coloration: Skin is not jaundiced or pale.  Neurological:     Mental Status: She is alert and oriented to person, place, and time.      UC Treatments /  Results  Labs (all labs ordered are listed, but only abnormal results are displayed) Labs Reviewed  POCT URINALYSIS DIP (MANUAL ENTRY) - Abnormal; Notable for the following components:      Result Value   Spec Grav, UA >=1.030 (*)    Nitrite, UA Positive (*)    Leukocytes,  UA Trace (*)    All other components within normal limits  URINE CULTURE    EKG   Radiology No results found.  Procedures Procedures (including critical care time)  Medications Ordered in UC Medications - No data to display  Initial Impression / Assessment and Plan / UC Course  I have reviewed the triage vital signs and the nursing notes.  Pertinent labs & imaging results that were available during my care of the patient were reviewed by me and considered in my medical decision making (see chart for details).     Patient febrile, nontoxic in office today.  No signs/symptoms of acute abdomen.  Urine dipstick significant for elevated specific gravity, positive nitrates and leukocytes.  Culture pending.  Last culture from October 2020 showing pansensitive E. coli.  Will start Keflex for UTI, Diflucan for post antibiotic vaginal Candida.  Return precautions discussed, patient verbalized understanding and is agreeable to plan. Final Clinical Impressions(s) / UC Diagnoses   Final diagnoses:  Acute cystitis with hematuria     Discharge Instructions     Take antibiotic twice daily with food. Important to drink plenty of water throughout the day. Return for worsening urinary symptoms, blood in urine, abdominal or back pain, fever.    ED Prescriptions    Medication Sig Dispense Auth. Provider   cephALEXin (KEFLEX) 500 MG capsule Take 1 capsule (500 mg total) by mouth 2 (two) times daily for 5 days. 10 capsule Hall-Potvin, [place], PA-C   fluconazole (DIFLUCAN) 200 MG tablet Take 1 tablet (200 mg total) by mouth once for 1 dose. May repeat in 72 hours if needed 2 tablet Hall-Potvin, [place], PA-C      PDMP not reviewed this encounter.   Hall-Potvin, [place], [place] 04/04/20 1231

## 2020-04-07 LAB — URINE CULTURE: Culture: >=100000 — AB

## 2020-04-22 ENCOUNTER — Other Ambulatory Visit: Payer: Self-pay

## 2020-04-22 ENCOUNTER — Emergency Department (HOSPITAL_COMMUNITY)
Admission: EM | Admit: 2020-04-22 | Discharge: 2020-04-22 | Disposition: A | Discharge status: Home / Self Care | Payer: Medicaid Other | Attending: Emergency Medicine | Admitting: Emergency Medicine

## 2020-04-22 ENCOUNTER — Emergency Department (HOSPITAL_COMMUNITY): Payer: Medicaid Other

## 2020-04-22 ENCOUNTER — Encounter (HOSPITAL_COMMUNITY): Payer: Self-pay

## 2020-04-22 DIAGNOSIS — R10816 Epigastric abdominal tenderness: Secondary | ICD-10-CM | POA: Insufficient documentation

## 2020-04-22 DIAGNOSIS — I712 Thoracic aortic aneurysm, without rupture, unspecified: Secondary | ICD-10-CM

## 2020-04-22 DIAGNOSIS — I10 Essential (primary) hypertension: Secondary | ICD-10-CM | POA: Diagnosis not present

## 2020-04-22 DIAGNOSIS — Z79899 Other long term (current) drug therapy: Secondary | ICD-10-CM | POA: Insufficient documentation

## 2020-04-22 DIAGNOSIS — R0789 Other chest pain: Secondary | ICD-10-CM | POA: Insufficient documentation

## 2020-04-22 LAB — BASIC METABOLIC PANEL
Anion gap: 12 (ref 5–15)
BUN: 17 mg/dL (ref 6–20)
CO2: 25 mmol/L (ref 22–32)
Calcium: 9.5 mg/dL (ref 8.9–10.3)
Chloride: 103 mmol/L (ref 98–111)
Creatinine, Ser: 0.79 mg/dL (ref 0.44–1.00)
GFR calc Af Amer: >60 mL/min (ref >60)
GFR calc non Af Amer: >60 mL/min (ref >60)
Glucose, Bld: 99 mg/dL (ref 70–99)
Potassium: 4.1 mmol/L (ref 3.5–5.1)
Sodium: 140 mmol/L (ref 135–145)

## 2020-04-22 LAB — TROPONIN I (HIGH SENSITIVITY)
Troponin I (High Sensitivity): <2 ng/L (ref <18)
Troponin I (High Sensitivity): 2 ng/L (ref <18)

## 2020-04-22 LAB — URINALYSIS, ROUTINE W REFLEX MICROSCOPIC
Bilirubin Urine: NEGATIVE
Glucose, UA: NEGATIVE mg/dL
Hgb urine dipstick: NEGATIVE
Ketones, ur: 20 mg/dL — AB
Nitrite: POSITIVE — AB
Protein, ur: NEGATIVE mg/dL
Specific Gravity, Urine: 1.013 (ref 1.005–1.030)
WBC, UA: >50 WBC/hpf — ABNORMAL HIGH (ref 0–5)
pH: 6 (ref 5.0–8.0)

## 2020-04-22 LAB — CBC
HCT: 43.2 % (ref 36.0–46.0)
Hemoglobin: 13.4 g/dL (ref 12.0–15.0)
MCH: 25.5 pg — ABNORMAL LOW (ref 26.0–34.0)
MCHC: 31 g/dL (ref 30.0–36.0)
MCV: 82.3 fL (ref 80.0–100.0)
Platelets: 328 10*3/uL (ref 150–400)
RBC: 5.25 MIL/uL — ABNORMAL HIGH (ref 3.87–5.11)
RDW: 14.4 % (ref 11.5–15.5)
WBC: 8.8 10*3/uL (ref 4.0–10.5)
nRBC: 0 % (ref 0.0–0.2)

## 2020-04-22 LAB — HEPATIC FUNCTION PANEL
ALT: 22 U/L (ref 0–44)
AST: 21 U/L (ref 15–41)
Albumin: 4.3 g/dL (ref 3.5–5.0)
Alkaline Phosphatase: 66 U/L (ref 38–126)
Bilirubin, Direct: 0.1 mg/dL (ref 0.0–0.2)
Indirect Bilirubin: 0.6 mg/dL (ref 0.3–0.9)
Total Bilirubin: 0.7 mg/dL (ref 0.3–1.2)
Total Protein: 8.4 g/dL — ABNORMAL HIGH (ref 6.5–8.1)

## 2020-04-22 LAB — I-STAT BETA HCG BLOOD, ED (NOT ORDERABLE): I-stat hCG, quantitative: <5 m[IU]/mL (ref <5)

## 2020-04-22 LAB — LIPASE, BLOOD: Lipase: 26 U/L (ref 11–51)

## 2020-04-22 IMAGING — CR DG CHEST 2V
2 series · 2 of 2 positions shown · non-contrast
Comparison: CT angiogram chest [DATE], chest radiographs
[DATE].

CLINICAL DATA: Chest pain.

EXAM:
CHEST - 2 VIEW

[w chest pa]
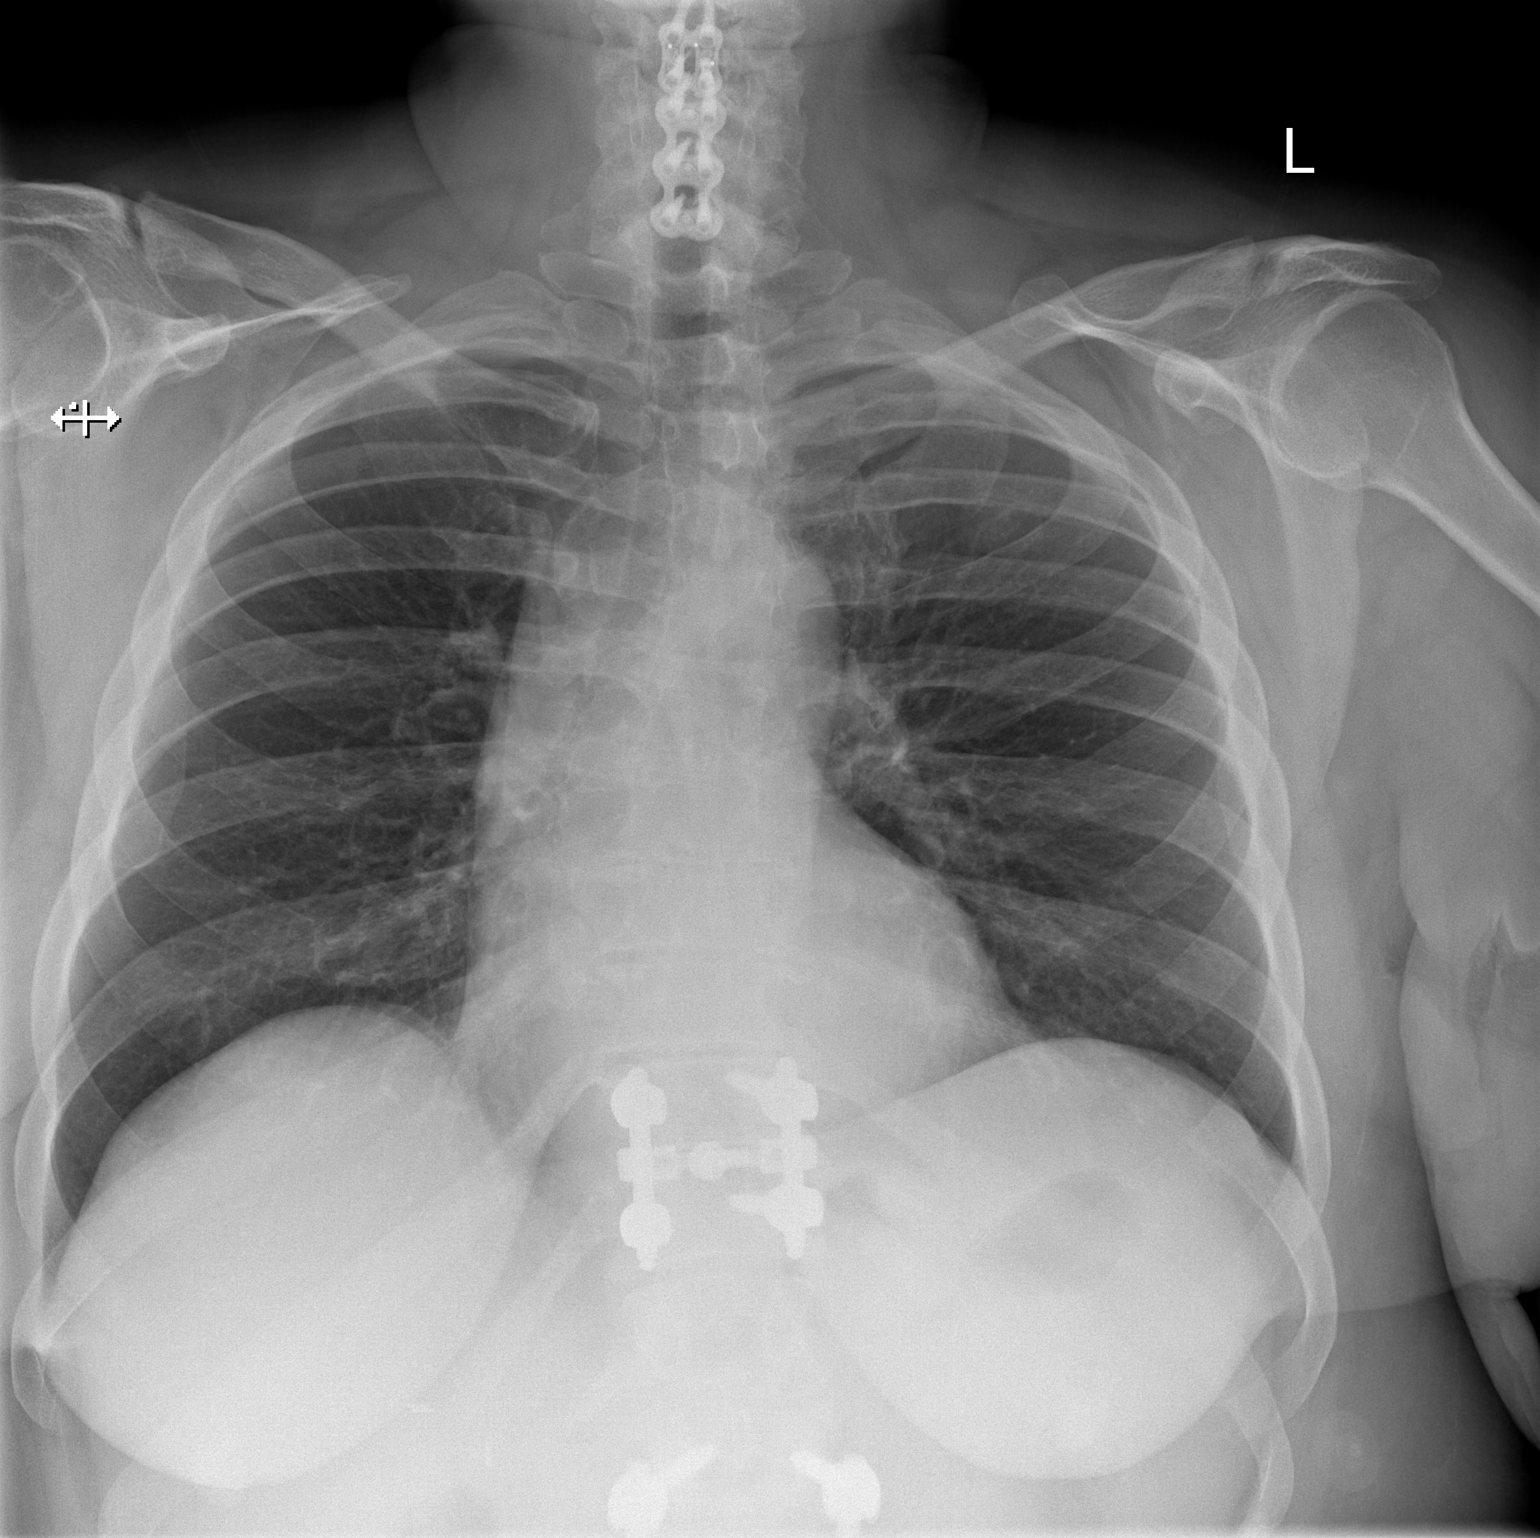

[w chest lat]
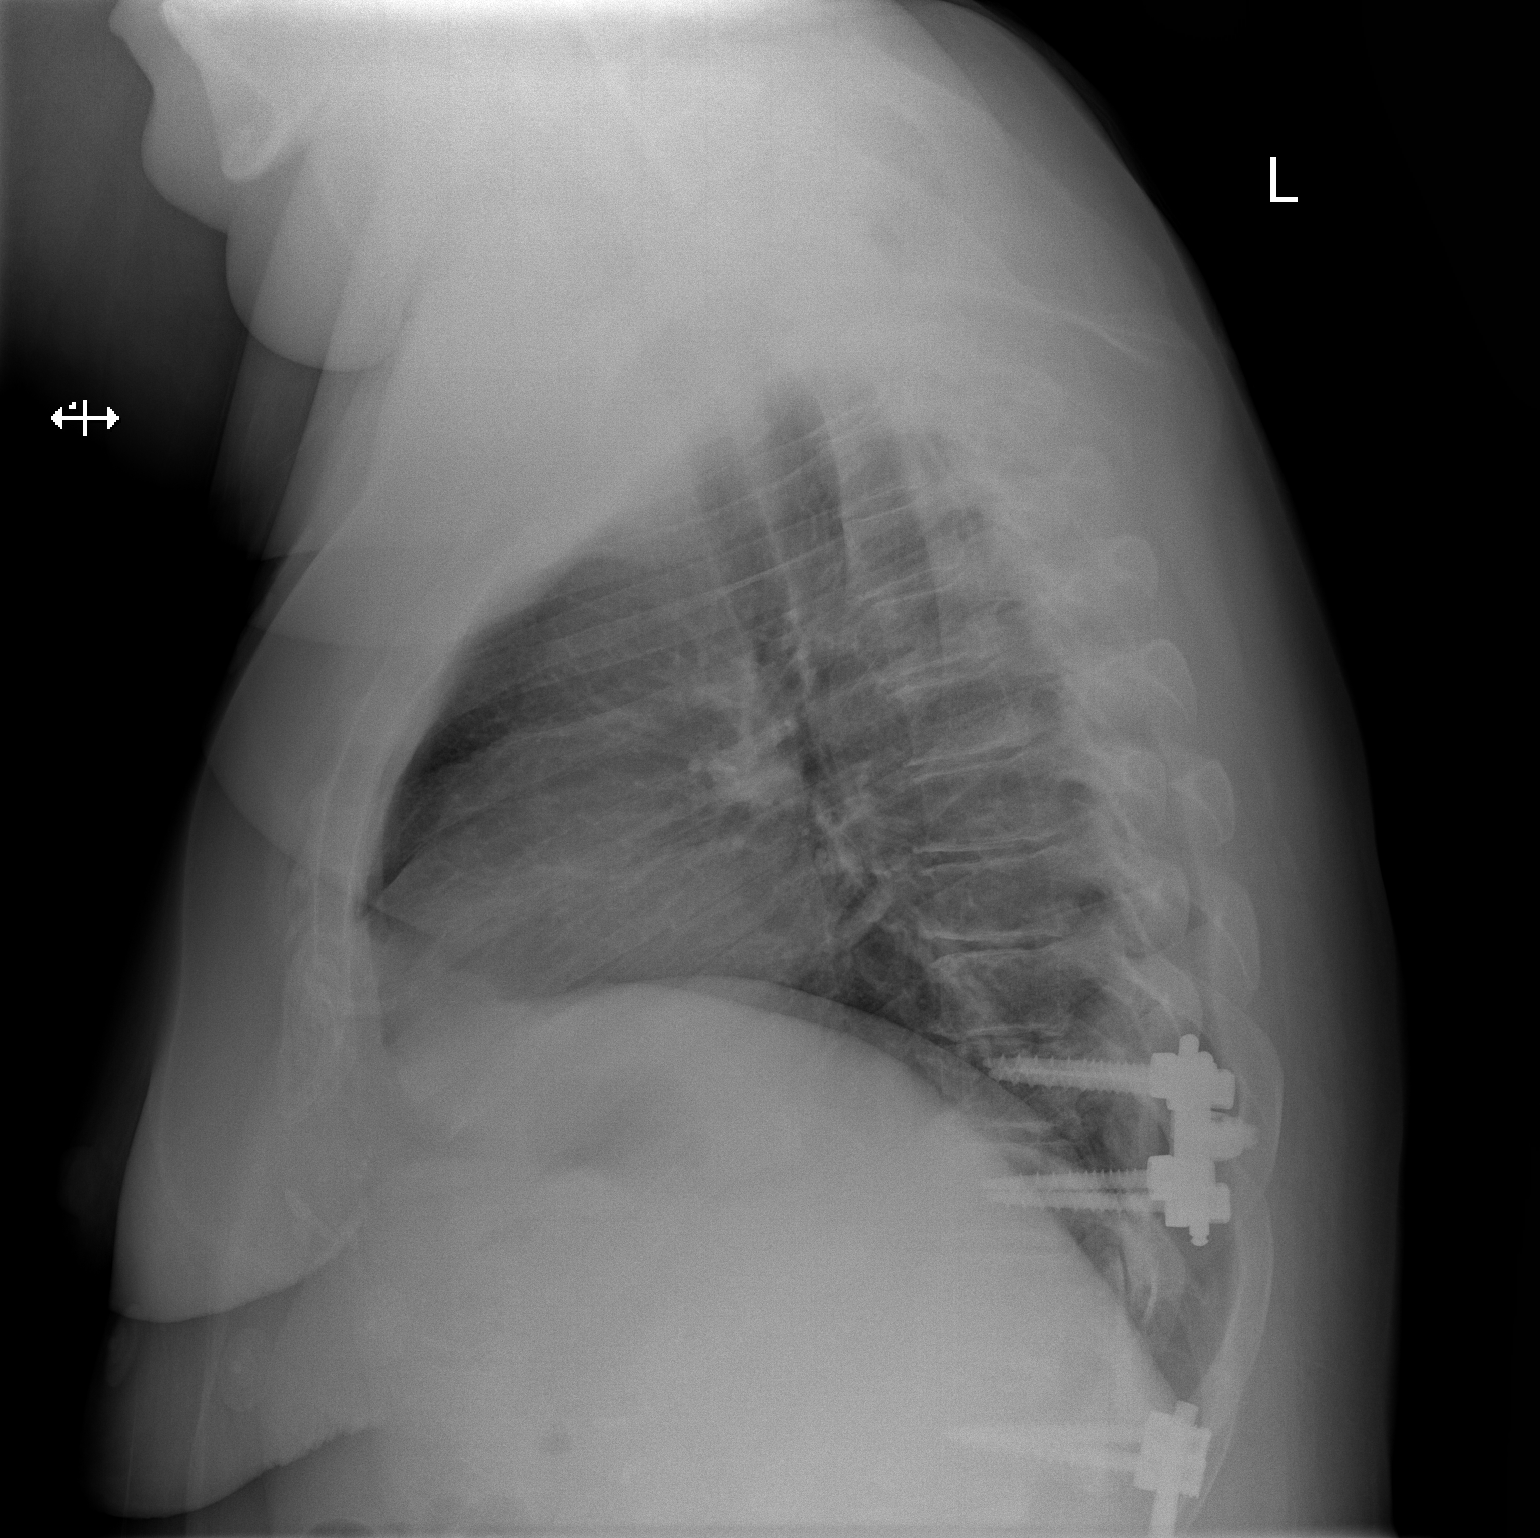

[2 of 2 positions shown; findings below may reference images not displayed]

FINDINGS: Heart size within normal limits. No appreciable airspace
consolidation or pulmonary edema. No evidence of pleural effusion or
pneumothorax. No acute bony abnormality identified. ACDF hardware
within the visualized cervical spine. Thoracolumbar spinal fusion
constructs.
IMPRESSION: No evidence of active cardiopulmonary disease.

## 2020-04-22 IMAGING — CT CT ANGIO CHEST
2 of 6 series · 18 of 36 positions shown · IV contrast (omnipaque)
Comparison: CT chest dated [DATE].

CLINICAL DATA: Chest pain and shortness of breath.

EXAM:
CT ANGIOGRAPHY CHEST WITH CONTRAST
TECHNIQUE: Multidetector CT imaging of the chest was performed using the
standard protocol during bolus administration of intravenous
contrast. Multiplanar CT image reconstructions and MIPs were
obtained to evaluate the vascular anatomy.
CONTRAST:  100mL OMNIPAQUE IOHEXOL 350 MG/ML SOLN

[Series 5: thins · axial · 0.75mm/px · z∈[+1368,+1633]mm · 17 of 299 slices shown]
[im 17/299  lung]
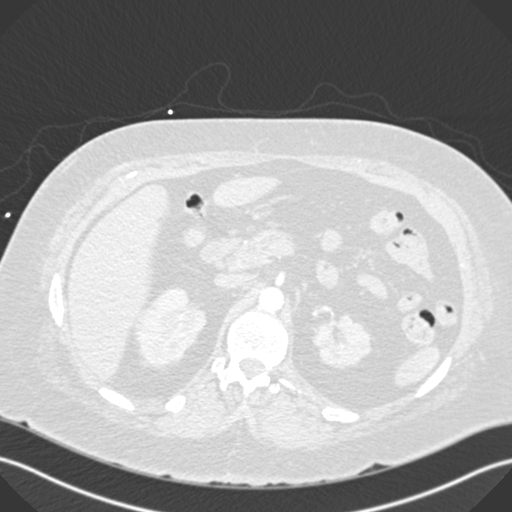
[im 34/299  mediastinal]
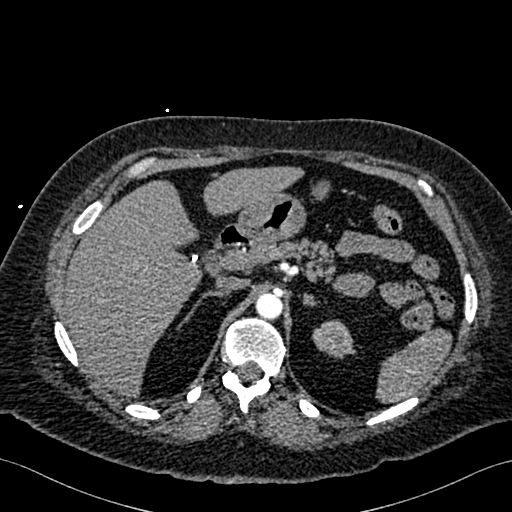
[im 50/299  lung]
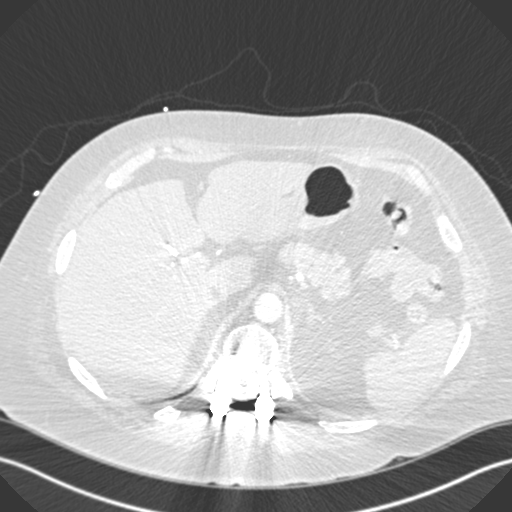
[im 67/299  mediastinal]
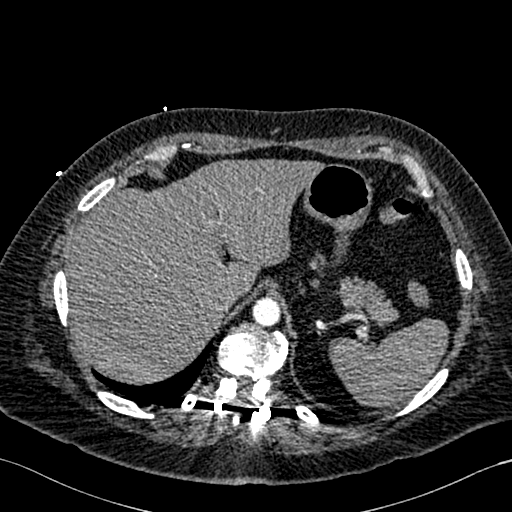
[im 83/299  lung]
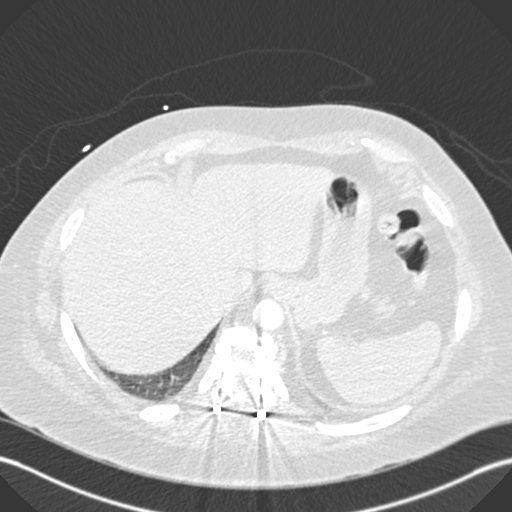
[im 100/299  mediastinal]
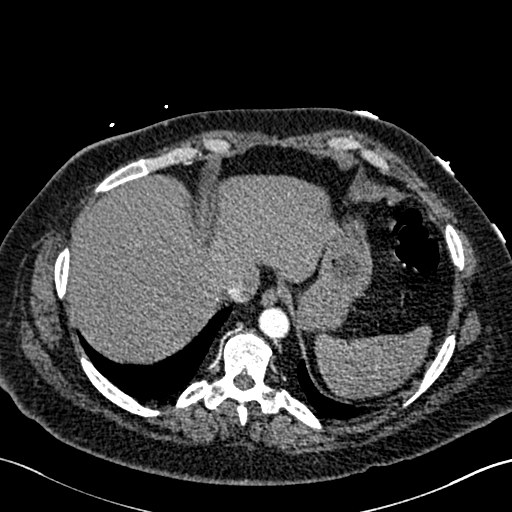
[im 116/299  lung]
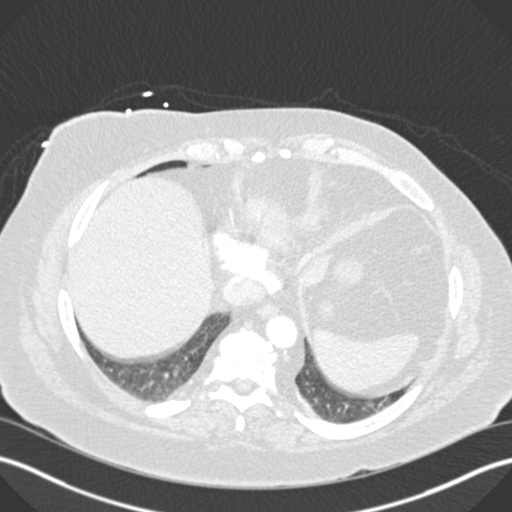
[im 133/299  mediastinal]
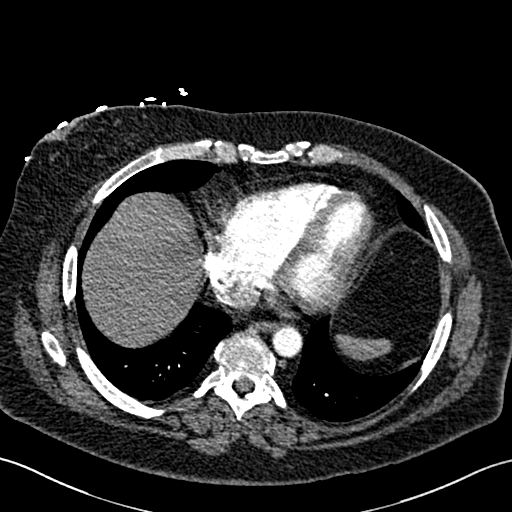
[im 150/299  lung]
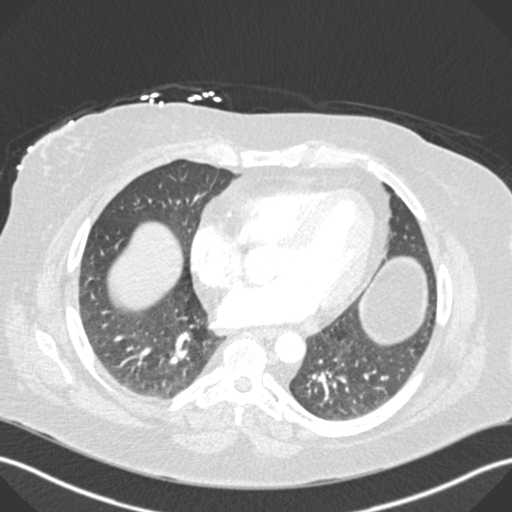
[im 166/299  mediastinal]
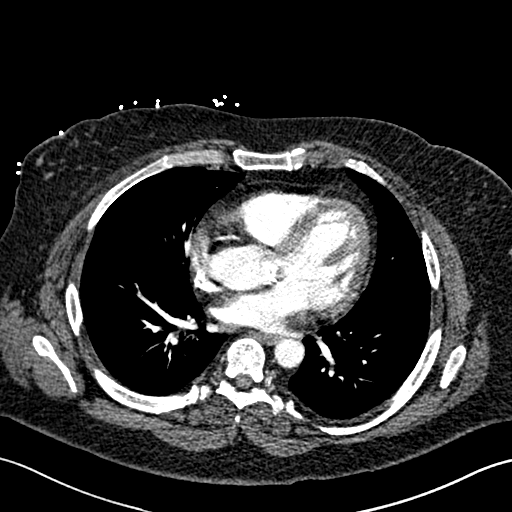
[im 183/299  lung]
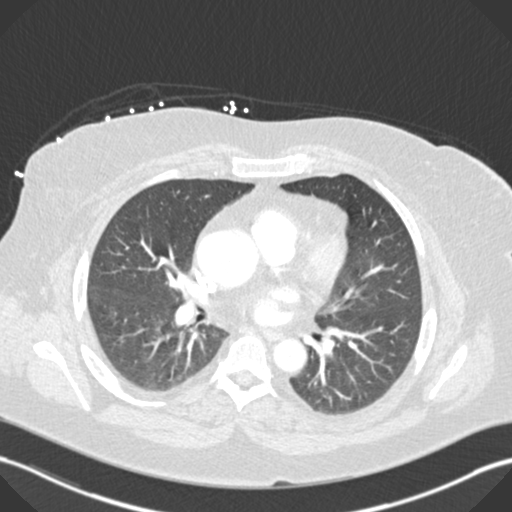
[im 199/299  mediastinal]
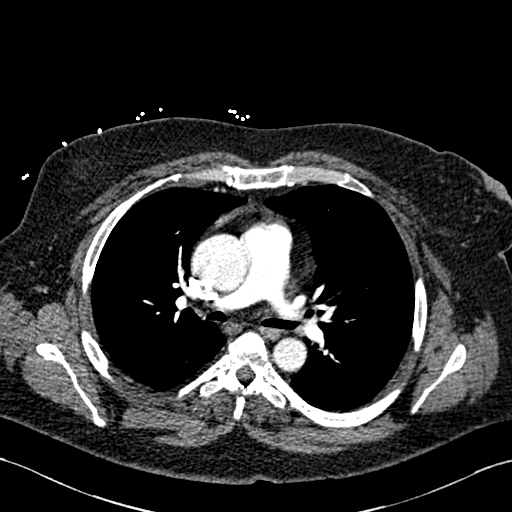
[im 216/299  lung]
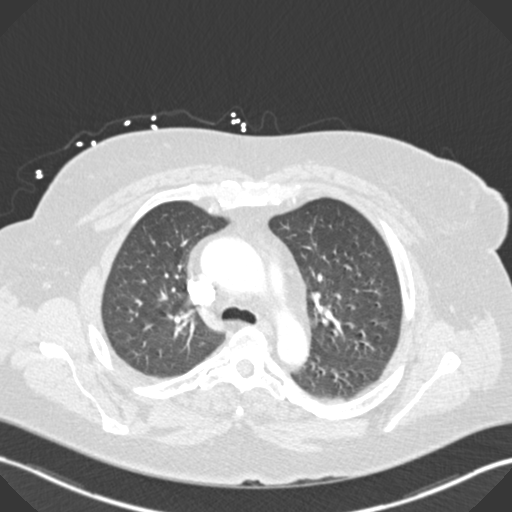
[im 232/299  mediastinal]
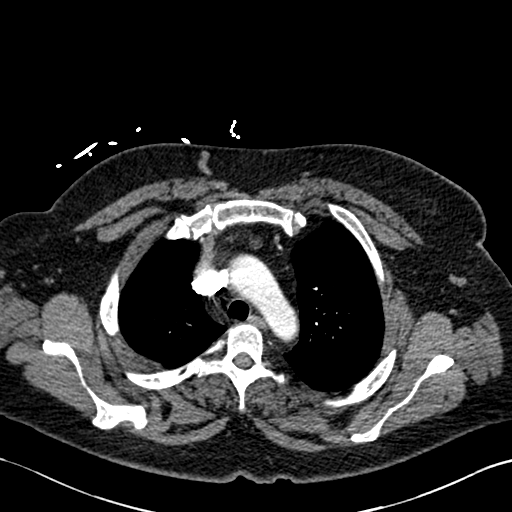
[im 249/299  lung]
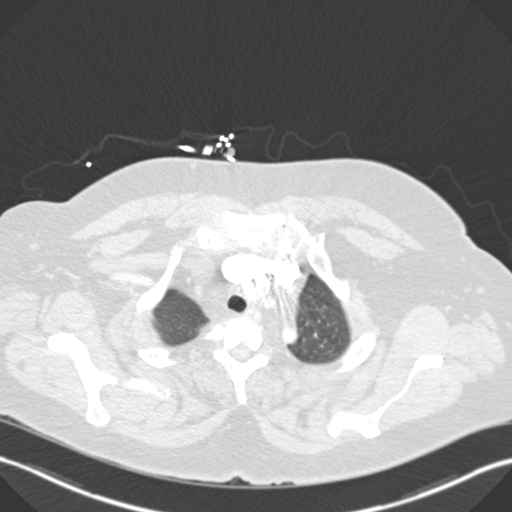
[im 265/299  mediastinal]
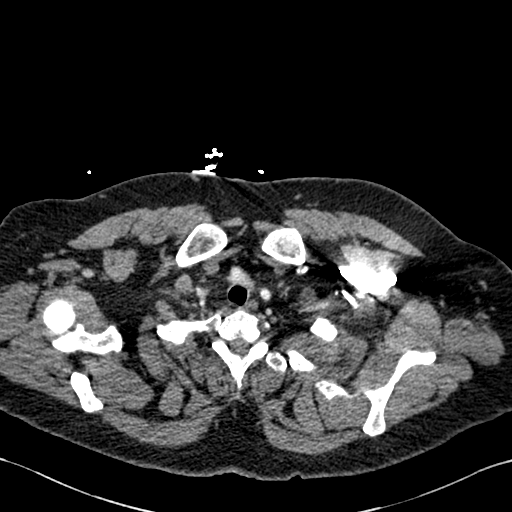
[im 282/299  lung]
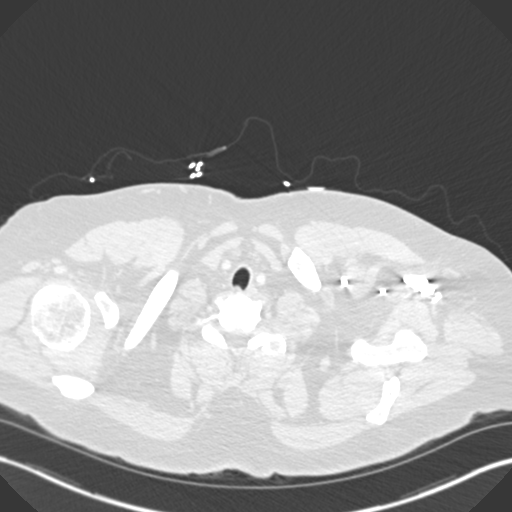

[Series 6: coronal mpr · coronal · 0.60mm/px · 1 of 145 slices shown]
[im 73/145  mediastinal]
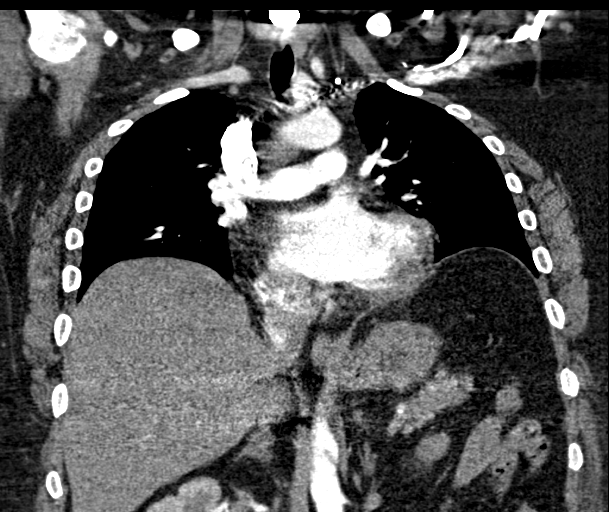

[18 of 36 positions shown; findings below may reference images not displayed]

FINDINGS: Cardiovascular: Satisfactory opacification of the pulmonary arteries
to the segmental level. No evidence of pulmonary embolism. Normal
heart size. No pericardial effusion. Slight interval increase in
size of the ascending thoracic aortic aneurysm, currently measuring
4.2 cm, previously 4.0 cm. No aortic dissection.

Mediastinum/Nodes: No enlarged mediastinal, hilar, or axillary lymph
nodes. Thyroid gland, trachea, and esophagus demonstrate no
significant findings.

Lungs/Pleura: No focal consolidation, pleural effusion, or
pneumothorax. No suspicious pulmonary nodule.

Upper Abdomen: No acute abnormality.  Unchanged left renal atrophy.

Musculoskeletal: No chest wall abnormality. No acute or significant
osseous findings. Prior cervical and lower thoracic effusions.

Review of the MIP images confirms the above findings.
IMPRESSION: 1. No evidence of pulmonary embolism. No acute intrathoracic
process.
2. Slight interval increase in size of the ascending thoracic aortic
aneurysm, currently measuring 4.2 cm, previously 4.0 cm. Recommend
annual imaging followup by CTA or MRA. This recommendation follows
[AT] ACCF/AHA/AATS/ACR/ASA/SCA/SANKALER/SANKALER/SANKALER/SANKALER Guidelines for the
Diagnosis and Management of Patients with Thoracic Aortic Disease.
Circulation. [AT]; 121: [id number]. Aortic aneurysm NOS ([AT]-[AT])

## 2020-04-22 MED ORDER — LIDOCAINE VISCOUS HCL 2 % MT SOLN
15.0000 mL | Freq: Once | OROMUCOSAL | Status: AC
  Administered 2020-04-22: 15 mL via ORAL
  Filled 2020-04-22: qty 15

## 2020-04-22 MED ORDER — ONDANSETRON HCL 4 MG/2ML IJ SOLN
4.0000 mg | Freq: Once | INTRAMUSCULAR | Status: AC
  Administered 2020-04-22: 4 mg via INTRAVENOUS
  Filled 2020-04-22: qty 2

## 2020-04-22 MED ORDER — SODIUM CHLORIDE 0.9% FLUSH
3.0000 mL | Freq: Once | INTRAVENOUS | Status: AC
  Administered 2020-04-22: 3 mL via INTRAVENOUS

## 2020-04-22 MED ORDER — ALUM & MAG HYDROXIDE-SIMETH 200-200-20 MG/5ML PO SUSP
30.0000 mL | Freq: Once | ORAL | Status: AC
  Administered 2020-04-22: 30 mL via ORAL
  Filled 2020-04-22: qty 30

## 2020-04-22 MED ORDER — IOHEXOL 350 MG/ML SOLN
100.0000 mL | Freq: Once | INTRAVENOUS | Status: AC | PRN
  Administered 2020-04-22: 100 mL via INTRAVENOUS

## 2020-04-22 MED ORDER — FENTANYL CITRATE (PF) 100 MCG/2ML IJ SOLN
50.0000 ug | Freq: Once | INTRAMUSCULAR | Status: AC
  Administered 2020-04-22: 50 ug via INTRAVENOUS
  Filled 2020-04-22: qty 2

## 2020-04-22 MED ORDER — PANTOPRAZOLE SODIUM 20 MG PO TBEC
20.0000 mg | DELAYED_RELEASE_TABLET | Freq: Every day | ORAL | 0 refills | Status: DC
Start: 2020-04-22 — End: 2021-06-23

## 2020-04-22 MED ORDER — CEPHALEXIN 500 MG PO CAPS
500.0000 mg | ORAL_CAPSULE | Freq: Two times a day (BID) | ORAL | 0 refills | Status: DC
Start: 2020-04-22 — End: 2020-05-17

## 2020-04-22 NOTE — ED Provider Notes (Signed)
[facility] DEPT Provider Note   CSN: [id number] Arrival date & time: 04/22/20  1106     History Chief Complaint  Patient presents with  . Chest Pain    Mary Chambers is a 49 y.o. female.  The history is provided by the patient and medical records. No language interpreter was used.  Chest Pain  Mary Chambers is a 49 y.o. female who presents to the Emergency Department complaining of chest pain. She presents the emergency department complaining of sharp chest pain that began at 330 this morning and woke her from sleep. Pain is waxing and waning in nature. Episodes last a few minutes and then resolved for a few minutes. No clear alleviating or worsening factors. She has associated nausea and shortness of breath. Pain is nonradiating. She denies any fevers, abdominal pain, vomiting, diarrhea. She has chronic lower extremity edema and this is at her baseline. She has a history of hypertension and back problems. She is a non-smoker. No history of DVT or PE.    Past Medical History:  Diagnosis Date  . Hypertension   . Kidney stone     Patient Active Problem List   Diagnosis Date Noted  . Herniated lumbar disc without myelopathy 10/02/2019  . Lumbar spinal stenosis 10/01/2019  . Pain in left foot 03/29/2018  . Arthritis 02/13/2018  . Chronic back pain 02/13/2018  . Migraines 02/13/2018  . Morbid obesity with BMI of 40.0-44.9, adult ([place]) 02/13/2018  . Synovial cyst of lumbar spine 10/15/2017  . Osteoarthritis of knee 07/13/2017  . Lumbar disc disease with radiculopathy 08/01/2016  . Fatigue 05/04/2016  . Headache 05/04/2016  . Numbness of foot 05/04/2016  . 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 Arthrodesis status 02/02/2016  . Chronic pain syndrome 02/02/2016  . Elevated white blood cell count 12/01/2015  . Lumbar stenosis with neurogenic claudication 05/07/2015  . Bilateral leg edema 04/21/2015  . Carpal tunnel syndrome of  right wrist 12/14/2014  . Renal atrophy, left 01/23/2014  . Achilles tendonitis 01/16/2014  . Right lower quadrant pain 12/18/2013  . Hypertension, benign 06/03/2010  . Urinary incontinence 06/03/2010  . Cervical disc disease 09/09/2009  . 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 ABDOMINAL HYSTERECTOMY    . BACK SURGERY    . CARPAL TUNNEL RELEASE    . CHOLECYSTECTOMY    . LITHOTRIPSY    . neck fusion    . TUBAL LIGATION       OB History   No obstetric history on file.     Family History  Problem Relation Age of Onset  . Diabetes Mother   . Hypertension Mother   . Cancer Father   . Parkinson's disease Father     Social History   Tobacco Use  . Smoking status: Never Smoker  . Smokeless tobacco: Never Used  Vaping Use  . Vaping Use: Never used  Substance Use Topics  . Alcohol use: No  . Drug use: Never    Home Medications Prior to Admission medications   Medication Sig Start Date End Date Taking? Authorizing Provider  Ascorbic Acid (VITAMIN C) 500 MG CAPS Take 500 mg by mouth daily.   Yes [provider]  cholecalciferol (VITAMIN D3) 25 MCG (1000 UT) tablet Take 1,000 Units by mouth daily.   Yes [provider]  enalapril (VASOTEC) 10 MG tablet Take 10 mg by mouth daily.   Yes [provider]  hydrochlorothiazide (HYDRODIURIL) 25 MG tablet Take 25 mg by mouth daily.   Yes [provider]  Multiple Vitamin (MULTIVITAMIN) capsule Take 1 capsule by mouth daily.    Yes [provider]  POTASSIUM PO Take 1,000 mg by mouth daily.   Yes [provider]  tiZANidine (ZANAFLEX) 2 MG tablet Take 2-4 mg by mouth 2 (two) times daily as needed for muscle spasms (sleep).  04/11/20  Yes [provider]  cephALEXin (KEFLEX) 500 MG capsule Take 1 capsule (500 mg total) by mouth 2 (two) times daily. 04/22/20   Quintella Reichert, MD  cetirizine (ZYRTEC) 10 MG chewable tablet Chew  1 tablet (10 mg total) by mouth daily. 05/06/19 06/05/19  Sharion Balloon, NP  meloxicam (MOBIC) 15 MG tablet Take 15 mg by mouth daily. Patient not taking: Reported on 04/22/2020 04/19/20   [provider]  naproxen sodium (ALEVE) 220 MG tablet Take 220 mg by mouth 2 (two) times daily as needed (pain). Patient not taking: Reported on 04/22/2020    [provider]  pantoprazole (PROTONIX) 20 MG tablet Take 1 tablet (20 mg total) by mouth daily. 04/22/20   Quintella Reichert, MD  potassium chloride SA (K-DUR,KLOR-CON) 20 MEQ tablet Take 20 mEq by mouth 2 (two) times daily. Patient not taking: Reported on 04/22/2020    [provider]  tiZANidine (ZANAFLEX) 4 MG tablet Take 1 tablet (4 mg total) by mouth every 6 (six) hours as needed for muscle spasms. Patient not taking: Reported on 04/22/2020 10/05/19   Eleonore Chiquito, NP  diphenhydrAMINE (BENADRYL) 25 mg capsule Take by mouth. 03/10/16 06/21/19  [provider]  fluticasone (FLONASE) 50 MCG/ACT nasal spray Place 2 sprays into both nostrils daily. Patient not taking: Reported on 10/01/2019 09/08/19 04/04/20  Ok Edwards, PA-C  LYRICA 50 MG capsule Take 50 mg by mouth 3 (three) times daily. 04/15/18 06/07/19  [provider]    Allergies    Baclofen, Doxycycline, Percocet [oxycodone-acetaminophen], Septra [sulfamethoxazole-trimethoprim], and Gabapentin  Review of Systems   Review of Systems  Cardiovascular: Positive for chest pain.  All other systems reviewed and are negative.   Physical Exam Updated Vital Signs BP (!) 140/80 (BP Location: Right Arm)   Pulse 96   Temp 98.4 F (36.9 C) (Oral)   Resp 13   Ht 5\' 7"  (1.702 m)   Wt (!) 117.9 kg   SpO2 98%   BMI 40.72 kg/m   Physical Exam Vitals and nursing note reviewed.  Constitutional:      Appearance: She is well-developed.     Comments: Uncomfortable appearing  HENT:     Head: Normocephalic and atraumatic.  Cardiovascular:     Rate and Rhythm:  Regular rhythm. Tachycardia present.     Heart sounds: No murmur heard.   Pulmonary:     Effort: Pulmonary effort is normal. No respiratory distress.     Breath sounds: Normal breath sounds.  Abdominal:     Palpations: Abdomen is soft.     Tenderness: There is no guarding or rebound.     Comments: Mild epigastric tenderness  Musculoskeletal:        General: No tenderness.  Skin:    General: Skin is warm and dry.  Neurological:     Mental Status: She is alert and oriented to person, place, and time.  Psychiatric:        Behavior: Behavior normal.     ED Results / Procedures / Treatments   Labs (all labs  ordered are listed, but only abnormal results are displayed) Labs Reviewed  CBC - Abnormal; Notable for the following components:      Result Value   RBC 5.25 (*)    MCH 25.5 (*)    All other components within normal limits  HEPATIC FUNCTION PANEL - Abnormal; Notable for the following components:   Total Protein 8.4 (*)    All other components within normal limits  URINALYSIS, ROUTINE W REFLEX MICROSCOPIC - Abnormal; Notable for the following components:   APPearance HAZY (*)    Ketones, ur 20 (*)    Nitrite POSITIVE (*)    Leukocytes,Ua LARGE (*)    WBC, UA >50 (*)    Bacteria, UA RARE (*)    All other components within normal limits  URINE CULTURE  BASIC METABOLIC PANEL  LIPASE, BLOOD  I-STAT BETA HCG BLOOD, ED ([facility], [facility], [facility] ONLY)  I-STAT BETA HCG BLOOD, ED (NOT ORDERABLE)  TROPONIN I (HIGH SENSITIVITY)  TROPONIN I (HIGH SENSITIVITY)    EKG EKG Interpretation  Date/Time:  Thursday April 22 2020 11:16:56 EDT Ventricular Rate:  96 PR Interval:    QRS Duration: 87 QT Interval:  332 QTC Calculation: 420 R Axis:   -10 Text Interpretation: Sinus rhythm Abnormal R-wave progression, early transition Left ventricular hypertrophy Borderline T abnormalities, anterior leads 12 Lead; Mason-Likar Confirmed by Quintella Reichert [phone]) on 04/22/2020 7:23:56  PM   Radiology DG Chest 2 View  Result Date: 04/22/2020 CLINICAL DATA:  Chest pain. EXAM: CHEST - 2 VIEW COMPARISON:  CT angiogram chest 09/30/2015, chest radiographs 09/29/2015. FINDINGS: Heart size within normal limits. No appreciable airspace consolidation or pulmonary edema. No evidence of pleural effusion or pneumothorax. No acute bony abnormality identified. ACDF hardware within the visualized cervical spine. Thoracolumbar spinal fusion constructs. IMPRESSION: No evidence of active cardiopulmonary disease. Electronically Signed   By: Kellie Simmering DO   On: 04/22/2020 11:36   CT Angio Chest PE W/Cm &/Or Wo Cm  Result Date: 04/22/2020 CLINICAL DATA:  Chest pain and shortness of breath. EXAM: CT ANGIOGRAPHY CHEST WITH CONTRAST TECHNIQUE: Multidetector CT imaging of the chest was performed using the standard protocol during bolus administration of intravenous contrast. Multiplanar CT image reconstructions and MIPs were obtained to evaluate the vascular anatomy. CONTRAST:  130mL OMNIPAQUE IOHEXOL 350 MG/ML SOLN COMPARISON:  CT chest dated September 30, 2015. FINDINGS: Cardiovascular: Satisfactory opacification of the pulmonary arteries to the segmental level. No evidence of pulmonary embolism. Normal heart size. No pericardial effusion. Slight interval increase in size of the ascending thoracic aortic aneurysm, currently measuring 4.2 cm, previously 4.0 cm. No aortic dissection. Mediastinum/Nodes: No enlarged mediastinal, hilar, or axillary lymph nodes. Thyroid gland, trachea, and esophagus demonstrate no significant findings. Lungs/Pleura: No focal consolidation, pleural effusion, or pneumothorax. No suspicious pulmonary nodule. Upper Abdomen: No acute abnormality.  Unchanged left renal atrophy. Musculoskeletal: No chest wall abnormality. No acute or significant osseous findings. Prior cervical and lower thoracic effusions. Review of the MIP images confirms the above findings. IMPRESSION: 1. No evidence of  pulmonary embolism. No acute intrathoracic process. 2. Slight interval increase in size of the ascending thoracic aortic aneurysm, currently measuring 4.2 cm, previously 4.0 cm. Recommend annual imaging followup by CTA or MRA. This recommendation follows 2010 ACCF/AHA/AATS/ACR/ASA/SCA/SCAI/SIR/STS/SVM Guidelines for the Diagnosis and Management of Patients with Thoracic Aortic Disease. Circulation. 2010; 121: S854-O270. Aortic aneurysm NOS (ICD10-I71.9) Electronically Signed   By: Titus Dubin M.D.   On: 04/22/2020 20:20    Procedures Procedures (including critical care  time)  Medications Ordered in ED Medications  sodium chloride flush (NS) 0.9 % injection 3 mL (3 mLs Intravenous Given 04/22/20 2021)  fentaNYL (SUBLIMAZE) injection 50 mcg (50 mcg Intravenous Given 04/22/20 2021)  ondansetron (ZOFRAN) injection 4 mg (4 mg Intravenous Given 04/22/20 2022)  iohexol (OMNIPAQUE) 350 MG/ML injection 100 mL (100 mLs Intravenous Contrast Given 04/22/20 1955)  alum & mag hydroxide-simeth (MAALOX/MYLANTA) 200-200-20 MG/5ML suspension 30 mL (30 mLs Oral Given 04/22/20 2127)    And  lidocaine (XYLOCAINE) 2 % viscous mouth solution 15 mL (15 mLs Oral Given 04/22/20 2126)    ED Course  I have reviewed the triage vital signs and the nursing notes.  Pertinent labs & imaging results that were available during my care of the patient were reviewed by me and considered in my medical decision making (see chart for details).    MDM Rules/Calculators/A&P                         Patient here for evaluation of chest pain that began this morning. EKG without acute ischemic changes in troponin is negative times two. CTA was obtained, which was negative for PE or dissection. CT does demonstrate thoracic aortic aneurysm, minimally increased compared to prior in 2016. Symptoms improved following G.I. cocktail. Discussed with patient home care for likely reflux. Discussed with patient finding of aneurysm and importance of  PCP follow-up for recheck. Incidentally patient found to have UTI on UA. Will send a culture and treat with Keflex. She is asymptomatic at this time.  Final Clinical Impression(s) / ED Diagnoses Final diagnoses:  Atypical chest pain  Thoracic aortic aneurysm without rupture [facility])    Rx / DC Orders ED Discharge Orders         Ordered    cephALEXin (KEFLEX) 500 MG capsule  2 times daily     Discontinue  Reprint     04/22/20 2059    pantoprazole (PROTONIX) 20 MG tablet  Daily     Discontinue  Reprint     04/22/20 2147           Quintella Reichert, MD 04/22/20 2154

## 2020-04-22 NOTE — ED Notes (Signed)
Assumed care of patient at this time, placed in a hospital gown, on the cardiac monitor with continuous pulse ox and automatic BP.  Purewik has been applied.

## 2020-04-22 NOTE — ED Triage Notes (Signed)
Pt presents with c/o chest pain that started around 3:30 this morning. Pt reports the pain woke her from her sleep. Pt reports she took some medicine (Tylenol) for the pain with no relief. Pt does report some associated shortness of breath as well.

## 2020-04-22 NOTE — Discharge Instructions (Addendum)
You had a CT scan performed today that showed a 4.2 cm aortic aneursym.  It does not appear to be contributing to your symptoms today.  You should have a repeat image performed by your doctor to look for changes.  Please follow up with your doctor for recheck.  Get rechecked immediately if you develop severe pain, new numbness, weakness or new concerning symptoms.

## 2020-04-24 LAB — URINE CULTURE: Culture: >=100000 — AB

## 2020-04-25 ENCOUNTER — Telehealth: Payer: Self-pay | Admitting: Emergency Medicine

## 2020-04-25 NOTE — Telephone Encounter (Signed)
Post ED Visit - Positive Culture Follow-up  Culture report reviewed by antimicrobial stewardship pharmacist: [facility] []  Elenor Quinones, Pharm.D. []  Heide Guile, Pharm.D., BCPS AQ-ID []  Parks Neptune, Pharm.D., BCPS []  Alycia Rossetti, Pharm.D., BCPS []  [place], Pharm.D., BCPS, AAHIVP []  Legrand Como, Pharm.D., BCPS, AAHIVP []  Salome Arnt, PharmD, BCPS []  Johnnette Gourd, PharmD, BCPS []  Hughes Better, PharmD, BCPS []  Leeroy Cha, PharmD []  Laqueta Linden, PharmD, BCPS []  Albertina Parr, PharmD  [place] Team []  Leodis Sias, PharmD []  Lindell Spar, PharmD []  Royetta Asal, PharmD []  Graylin Shiver, Rph []  Rema Fendt) Glennon Mac, PharmD []  Arlyn Dunning, PharmD []  Netta Cedars, PharmD []  Dia Sitter, PharmD []  Leone Haven, PharmD []  Gretta Arab, PharmD []  Theodis Shove, PharmD []  Peggyann Juba, PharmD [x]  Reuel Boom, PharmD   Positive urine culture Treated with Cephalexin, organism sensitive to the same and no further patient follow-up is required at this time.  Sandi Raveling Purvi Ruehl 04/25/2020, 4:22 PM

## 2020-05-12 DIAGNOSIS — I712 Thoracic aortic aneurysm, without rupture, unspecified: Secondary | ICD-10-CM | POA: Insufficient documentation

## 2020-05-13 DIAGNOSIS — R8290 Unspecified abnormal findings in urine: Secondary | ICD-10-CM | POA: Insufficient documentation

## 2020-05-13 HISTORY — DX: Unspecified abnormal findings in urine: R82.90

## 2020-05-17 ENCOUNTER — Ambulatory Visit
Admission: RE | Admit: 2020-05-17 | Discharge: 2020-05-17 | Disposition: A | Discharge status: Home / Self Care | Payer: Medicaid Other | Source: Ambulatory Visit | Attending: Physician Assistant | Admitting: Physician Assistant

## 2020-05-17 ENCOUNTER — Other Ambulatory Visit: Payer: Self-pay

## 2020-05-17 VITALS — BP 130/87 | HR 94 | Temp 98.3°F | Resp 18

## 2020-05-17 DIAGNOSIS — R519 Headache, unspecified: Secondary | ICD-10-CM

## 2020-05-17 DIAGNOSIS — R0981 Nasal congestion: Secondary | ICD-10-CM

## 2020-05-17 DIAGNOSIS — J029 Acute pharyngitis, unspecified: Secondary | ICD-10-CM

## 2020-05-17 DIAGNOSIS — Z1152 Encounter for screening for COVID-19: Secondary | ICD-10-CM

## 2020-05-17 MED ORDER — FLUTICASONE PROPIONATE 50 MCG/ACT NA SUSP
2.0000 | Freq: Every day | NASAL | 0 refills | Status: DC
Start: 2020-05-17 — End: 2020-09-16

## 2020-05-17 MED ORDER — DEXAMETHASONE SODIUM PHOSPHATE 10 MG/ML IJ SOLN
10.0000 mg | Freq: Once | INTRAMUSCULAR | Status: AC
  Administered 2020-05-17: 10 mg via INTRAMUSCULAR

## 2020-05-17 MED ORDER — IPRATROPIUM BROMIDE 0.06 % NA SOLN
2.0000 | Freq: Four times a day (QID) | NASAL | 0 refills | Status: DC
Start: 2020-05-17 — End: 2020-12-15

## 2020-05-17 NOTE — Discharge Instructions (Signed)
COVID PCR testing ordered. I would like you to quarantine until testing results. Decadon injection today for headache/sinus pressure. Start flonase, atrovent nasal spray for nasal congestion/drainage. You can use over the counter nasal saline rinse such as neti pot for nasal congestion. Keep hydrated, your urine should be clear to pale yellow in color. Tylenol/motrin for fever and pain. Monitor for any worsening of symptoms, chest pain, shortness of breath, wheezing, swelling of the throat, go to the emergency department for further evaluation needed.

## 2020-05-17 NOTE — ED Triage Notes (Signed)
Pt c/o headache, nasal congestion, and sore throat since last Thursday. States is covid vaccinated.

## 2020-05-17 NOTE — ED Provider Notes (Signed)
[facility]    CSN: [id number] Arrival date & time: 05/17/20  1055      History   Chief Complaint Chief Complaint  Patient presents with  . Nasal Congestion    HPI Mary Chambers is a 49 y.o. female.   49 year old female comes in for 4 day of URI symptoms. Nasal congestion, post nasal drip, headache, sore throat. Denies fever, chills, body aches. Denies abdominal pain, nausea, vomiting, diarrhea. Denies shortness of breath, loss of taste/smell. Fully COVID vaccinated.      Past Medical History:  Diagnosis Date  . Hypertension   . Kidney stone     Patient Active Problem List   Diagnosis Date Noted  . Herniated lumbar disc without myelopathy 10/02/2019  . Lumbar spinal stenosis 10/01/2019  . Pain in left foot 03/29/2018  . Arthritis 02/13/2018  . Chronic back pain 02/13/2018  . Migraines 02/13/2018  . Morbid obesity with BMI of 40.0-44.9, adult ([place]) 02/13/2018  . Synovial cyst of lumbar spine 10/15/2017  . Osteoarthritis of knee 07/13/2017  . Lumbar disc disease with radiculopathy 08/01/2016  . Fatigue 05/04/2016  . Headache 05/04/2016  . Numbness of foot 05/04/2016  . 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 Arthrodesis status 02/02/2016  . Chronic pain syndrome 02/02/2016  . Elevated white blood cell count 12/01/2015  . Lumbar stenosis with neurogenic claudication 05/07/2015  . Bilateral leg edema 04/21/2015  . Carpal tunnel syndrome of right wrist 12/14/2014  . Renal atrophy, left 01/23/2014  . Achilles tendonitis 01/16/2014  . Right lower quadrant pain 12/18/2013  . Hypertension, benign 06/03/2010  . Urinary incontinence 06/03/2010  . Cervical disc disease 09/09/2009  . 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 ABDOMINAL HYSTERECTOMY    . BACK SURGERY    . CARPAL TUNNEL RELEASE    . CHOLECYSTECTOMY    . LITHOTRIPSY    . neck fusion    . TUBAL LIGATION      OB History   No obstetric  history on file.      Home Medications    Prior to Admission medications   Medication Sig Start Date End Date Taking? Authorizing Provider  Ascorbic Acid (VITAMIN C) 500 MG CAPS Take 500 mg by mouth daily.    [provider]  cetirizine (ZYRTEC) 10 MG chewable tablet Chew 1 tablet (10 mg total) by mouth daily. 05/06/19 06/05/19  Sharion Balloon, NP  cholecalciferol (VITAMIN D3) 25 MCG (1000 UT) tablet Take 1,000 Units by mouth daily.    [provider]  enalapril (VASOTEC) 10 MG tablet Take 10 mg by mouth daily.    [provider]  fluticasone (FLONASE) 50 MCG/ACT nasal spray Place 2 sprays into both nostrils daily. 05/17/20   Tasia Catchings, Eberardo Demello V, PA-C  hydrochlorothiazide (HYDRODIURIL) 25 MG tablet Take 25 mg by mouth daily.    [provider]  ipratropium (ATROVENT) 0.06 % nasal spray Place 2 sprays into both nostrils 4 (four) times daily. 05/17/20   Tasia Catchings, Amali Uhls V, PA-C  Multiple Vitamin (MULTIVITAMIN) capsule Take 1 capsule by mouth daily.     [provider]  pantoprazole (PROTONIX) 20 MG tablet Take 1 tablet (20 mg total) by mouth daily. 04/22/20   Quintella Reichert, MD  POTASSIUM PO Take 1,000 mg by mouth daily.    [provider]  tiZANidine (ZANAFLEX) 2 MG tablet Take 2-4 mg by mouth 2 (two) times daily as needed for muscle spasms (sleep).  04/11/20   [provider]  diphenhydrAMINE (BENADRYL) 25 mg capsule Take by mouth. 03/10/16 06/21/19  [provider]  LYRICA 50 MG capsule Take 50 mg by mouth 3 (three) times daily. 04/15/18 06/07/19  [provider]    Family History Family History  Problem Relation Age of Onset  . Diabetes Mother   . Hypertension Mother   . Cancer Father   . Parkinson's disease Father     Social History Social History   Tobacco Use  . Smoking status: Never Smoker  . Smokeless tobacco: Never Used  Vaping Use  . Vaping Use: Never used  Substance Use Topics  . Alcohol use: No  . Drug use: Never      Allergies   Baclofen, Doxycycline, Percocet [oxycodone-acetaminophen], Septra [sulfamethoxazole-trimethoprim], and Gabapentin   Review of Systems Review of Systems  Reason unable to perform ROS: See HPI as above.     Physical Exam Triage Vital Signs ED Triage Vitals  Enc Vitals Group     BP 05/17/20 1108 130/87     Pulse Rate 05/17/20 1108 94     Resp 05/17/20 1108 18     Temp 05/17/20 1108 98.3 F (36.8 C)     Temp Source 05/17/20 1108 Oral     SpO2 05/17/20 1108 96 %     Weight --      Height --      Head Circumference --      Peak Flow --      Pain Score 05/17/20 1109 0     Pain Loc --      Pain Edu? --      Excl. in [place]? --    No data found.  Updated Vital Signs BP 130/87 (BP Location: Left Arm)   Pulse 94   Temp 98.3 F (36.8 C) (Oral)   Resp 18   SpO2 96%   Physical Exam Constitutional:      General: She is not in acute distress.    Appearance: Normal appearance. She is well-developed. She is not ill-appearing, toxic-appearing or diaphoretic.  HENT:     Head: Normocephalic and atraumatic.     Right Ear: Tympanic membrane, ear canal and external ear normal. Tympanic membrane is not erythematous or bulging.     Left Ear: Tympanic membrane, ear canal and external ear normal. Tympanic membrane is not erythematous or bulging.     Nose:     Right Sinus: Maxillary sinus tenderness present. No frontal sinus tenderness.     Left Sinus: Maxillary sinus tenderness present. No frontal sinus tenderness.     Mouth/Throat:     Mouth: Mucous membranes are moist.     Pharynx: Oropharynx is clear. Uvula midline.  Eyes:     Conjunctiva/sclera: Conjunctivae normal.     Pupils: Pupils are equal, round, and reactive to light.  Cardiovascular:     Rate and Rhythm: Normal rate and regular rhythm.  Pulmonary:     Effort: Pulmonary effort is normal. No accessory muscle usage, prolonged expiration, respiratory distress or retractions.     Breath sounds: No decreased air  movement or transmitted upper airway sounds. No decreased breath sounds.     Comments: LCTAB Musculoskeletal:     Cervical back: Normal range of motion and neck supple.  Skin:    General: Skin is warm and dry.  Neurological:     Mental Status: She is alert and oriented to person, place, and time.      UC Treatments /  Results  Labs (all labs ordered are listed, but only abnormal results are displayed) Labs Reviewed  NOVEL CORONAVIRUS, NAA    EKG   Radiology No results found.  Procedures Procedures (including critical care time)  Medications Ordered in UC Medications  dexamethasone (DECADRON) injection 10 mg (has no administration in time range)    Initial Impression / Assessment and Plan / UC Course  I have reviewed the triage vital signs and the nursing notes.  Pertinent labs & imaging results that were available during my care of the patient were reviewed by me and considered in my medical decision making (see chart for details).    COVID PCR test ordered. Patient to quarantine until testing results return. No alarming signs on exam.  LCTAB. Symptomatic treatment discussed.  Push fluids.  Return precautions given.  Patient expresses understanding and agrees to plan.  Final Clinical Impressions(s) / UC Diagnoses   Final diagnoses:  Encounter for screening for COVID-19  Acute intractable headache, unspecified headache type  Sore throat  Nasal congestion    ED Prescriptions    Medication Sig Dispense Auth. Provider   fluticasone (FLONASE) 50 MCG/ACT nasal spray Place 2 sprays into both nostrils daily. 1 g Zaine Elsass V, PA-C   ipratropium (ATROVENT) 0.06 % nasal spray Place 2 sprays into both nostrils 4 (four) times daily. 15 mL Ok Edwards, PA-C     PDMP not reviewed this encounter.   Ok Edwards, PA-C 05/17/20 1127

## 2020-05-18 LAB — SARS-COV-2, NAA 2 DAY TAT

## 2020-05-18 LAB — NOVEL CORONAVIRUS, NAA: SARS-CoV-2, NAA: NOT DETECTED

## 2020-06-21 ENCOUNTER — Ambulatory Visit: Payer: Self-pay

## 2020-06-21 ENCOUNTER — Other Ambulatory Visit: Payer: Self-pay

## 2020-06-21 ENCOUNTER — Ambulatory Visit (INDEPENDENT_AMBULATORY_CARE_PROVIDER_SITE_OTHER): Payer: Medicaid Other

## 2020-06-21 ENCOUNTER — Ambulatory Visit (INDEPENDENT_AMBULATORY_CARE_PROVIDER_SITE_OTHER): Payer: Medicaid Other | Admitting: Podiatry

## 2020-06-21 DIAGNOSIS — G894 Chronic pain syndrome: Secondary | ICD-10-CM

## 2020-06-21 DIAGNOSIS — M722 Plantar fascial fibromatosis: Secondary | ICD-10-CM

## 2020-06-21 DIAGNOSIS — R52 Pain, unspecified: Secondary | ICD-10-CM

## 2020-06-21 DIAGNOSIS — M216X1 Other acquired deformities of right foot: Secondary | ICD-10-CM

## 2020-06-21 DIAGNOSIS — M21371 Foot drop, right foot: Secondary | ICD-10-CM

## 2020-06-21 DIAGNOSIS — M21372 Foot drop, left foot: Secondary | ICD-10-CM

## 2020-06-21 DIAGNOSIS — M48061 Spinal stenosis, lumbar region without neurogenic claudication: Secondary | ICD-10-CM

## 2020-06-21 DIAGNOSIS — M5126 Other intervertebral disc displacement, lumbar region: Secondary | ICD-10-CM

## 2020-06-21 NOTE — [place] Notes (Signed)
°  Subjective:  Patient ID: Mary Chambers, female    DOB: 07/12/1971,  MRN: [id number]  Chief Complaint  Patient presents with   Foot Pain    bilateral foot pain. Pt states that her feet hurt all the time. Burning type of pain in feet, feet stay swollen. pt has treat with cream, icy hot and elevating. The only aggravating factors are from walking.     49 y.o. female presents with the above complaint. History confirmed with patient.  She has a significant history of spinal disease, and radiculopathy.  She also states that when she walks she feels at her foot slaps the floor and she is often at risk of falling or tripping over herself.  Objective:  Physical Exam: Bilaterally she has +2 pitting edema throughout the lower extremities, pulses are not currently palpable secondary to this.  She has pes planus.  Her anterior compartment along the tibialis anterior is 4- on the left side and 4+ on the right side in strength, she has normal eversion, plantarflexion strength.  Reduced sensation throughout the bilateral lower extremities.  Radiographs: X-ray of both feet: no fracture, dislocation, swelling or degenerative changes noted Assessment:   1. Pain   2. Bilateral foot-drop   3. Herniated lumbar disc without myelopathy   4. Spinal stenosis of lumbar region without neurogenic claudication   5. Chronic pain syndrome      Plan:  Patient was evaluated and treated and all questions answered.  -Discussed with her that her foot drop and neuropathic symptoms are secondary to her spinal disease.  I recommended that she have support in a minimal carbon fiber AFO for foot drop.  She had a previous thermoplastic posterior AFO 6 years ago that was not helpful.  I think it would be more tolerable for her to wear.  We also discussed treatment of the burning pain that she often has.  She had a severe reaction of angioedema to previous gabapentin use.  She tried Lyrica once and this was helpful.   I encouraged her to discuss correction of this from her PCP or her neurologist.  Prescription for foot drop AFO was given to her to take to [facility]   No follow-ups on file.

## 2020-08-05 ENCOUNTER — Ambulatory Visit
Admission: RE | Admit: 2020-08-05 | Discharge: 2020-08-05 | Disposition: A | Discharge status: Home / Self Care | Payer: Medicaid Other | Source: Ambulatory Visit | Attending: Emergency Medicine | Admitting: Emergency Medicine

## 2020-08-05 ENCOUNTER — Other Ambulatory Visit: Payer: Self-pay

## 2020-08-05 VITALS — BP 167/84 | HR 73 | Temp 98.7°F | Resp 20

## 2020-08-05 DIAGNOSIS — Z1152 Encounter for screening for COVID-19: Secondary | ICD-10-CM | POA: Diagnosis not present

## 2020-08-05 DIAGNOSIS — J069 Acute upper respiratory infection, unspecified: Secondary | ICD-10-CM | POA: Diagnosis not present

## 2020-08-05 MED ORDER — BENZONATATE 100 MG PO CAPS
100.0000 mg | ORAL_CAPSULE | Freq: Three times a day (TID) | ORAL | 0 refills | Status: DC
Start: 2020-08-05 — End: 2020-09-16

## 2020-08-05 NOTE — ED Triage Notes (Signed)
Pt states she has had cough and congestion for 3-4 days. Pt states no fevers or body aches or other symptoms. Pt is aox4 and ambulatory.

## 2020-08-05 NOTE — Discharge Instructions (Signed)
Your COVID test is pending - it is important to quarantine / isolate at home until your results are back. °If you test positive and would like further evaluation for persistent or worsening symptoms, you may schedule an E-visit or virtual (video) visit throughout the [facility] MyChart app or website. ° °PLEASE NOTE: If you develop severe chest pain or shortness of breath please go to the ER or call 9-1-1 for further evaluation --> DO NOT schedule electronic or virtual visits for this. °Please call our office for further guidance / recommendations as needed. ° °For information about the Covid vaccine, please visit [email or web address] °

## 2020-08-05 NOTE — ED Provider Notes (Signed)
[facility]    CSN: [id number] Arrival date & time: 08/05/20  1717      History   Chief Complaint Chief Complaint  Patient presents with  . Cough    since yesterday  . Nasal Congestion    x 3 days    HPI Mary Chambers is a 49 y.o. female  Presenting for 4-day course of nonproductive cough, severe nasal congestion with associated sinus pressure. Denies fever, myalgias, shortness of breath or chest pain. Did have possible secondary Covid exposure from family member within the last 2 weeks. Also concern for sinus infection as she has a history thereof.  Past Medical History:  Diagnosis Date  . Hypertension   . Kidney stone     Patient Active Problem List   Diagnosis Date Noted  . Herniated lumbar disc without myelopathy 10/02/2019  . Lumbar spinal stenosis 10/01/2019  . Pain in left foot 03/29/2018  . Arthritis 02/13/2018  . Chronic back pain 02/13/2018  . Migraines 02/13/2018  . Morbid obesity with BMI of 40.0-44.9, adult ([place]) 02/13/2018  . Synovial cyst of lumbar spine 10/15/2017  . Osteoarthritis of knee 07/13/2017  . Lumbar disc disease with radiculopathy 08/01/2016  . Fatigue 05/04/2016  . Headache 05/04/2016  . Numbness of foot 05/04/2016  . 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 Arthrodesis status 02/02/2016  . Chronic pain syndrome 02/02/2016  . Elevated white blood cell count 12/01/2015  . Lumbar stenosis with neurogenic claudication 05/07/2015  . Bilateral leg edema 04/21/2015  . Carpal tunnel syndrome of right wrist 12/14/2014  . Renal atrophy, left 01/23/2014  . Achilles tendonitis 01/16/2014  . Right lower quadrant pain 12/18/2013  . Hypertension, benign 06/03/2010  . Urinary incontinence 06/03/2010  . Cervical disc disease 09/09/2009  . 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 ABDOMINAL HYSTERECTOMY    . BACK SURGERY    . CARPAL TUNNEL RELEASE    . CHOLECYSTECTOMY    .  LITHOTRIPSY    . neck fusion    . TUBAL LIGATION      OB History   No obstetric history on file.      Home Medications    Prior to Admission medications   Medication Sig Start Date End Date Taking? Authorizing Provider  Ascorbic Acid (VITAMIN C) 500 MG CAPS Take 500 mg by mouth daily.   Yes [provider]  cholecalciferol (VITAMIN D3) 25 MCG (1000 UT) tablet Take 1,000 Units by mouth daily.   Yes [provider]  enalapril (VASOTEC) 10 MG tablet Take 10 mg by mouth daily.   Yes [provider]  fluticasone (FLONASE) 50 MCG/ACT nasal spray Place 2 sprays into both nostrils daily. 05/17/20  Yes Yu, Amy V, PA-C  hydrochlorothiazide (HYDRODIURIL) 25 MG tablet Take 25 mg by mouth daily.   Yes [provider]  ipratropium (ATROVENT) 0.06 % nasal spray Place 2 sprays into both nostrils 4 (four) times daily. 05/17/20  Yes Yu, Amy V, PA-C  Multiple Vitamin (MULTIVITAMIN) capsule Take 1 capsule by mouth daily.    Yes [provider]  pantoprazole (PROTONIX) 20 MG tablet Take 1 tablet (20 mg total) by mouth daily. 04/22/20  Yes Quintella Reichert, MD  tiZANidine (ZANAFLEX) 2 MG tablet Take 2-4 mg by mouth 2 (two) times daily as needed for muscle spasms (sleep).  04/11/20  Yes [provider]  benzonatate (TESSALON) 100 MG capsule Take 1 capsule (100 mg total) by mouth every  8 (eight) hours. 08/05/20   Hall-Potvin, [place], PA-C  cetirizine (ZYRTEC) 10 MG chewable tablet Chew 1 tablet (10 mg total) by mouth daily. 05/06/19 06/05/19  Sharion Balloon, NP  POTASSIUM PO Take 1,000 mg by mouth daily.    [provider]  diphenhydrAMINE (BENADRYL) 25 mg capsule Take by mouth. 03/10/16 06/21/19  [provider]  LYRICA 50 MG capsule Take 50 mg by mouth 3 (three) times daily. 04/15/18 06/07/19  [provider]    Family History Family History  Problem Relation Age of Onset  . Diabetes Mother   . Hypertension Mother   . Cancer Father   .  Parkinson's disease Father     Social History Social History   Tobacco Use  . Smoking status: Never Smoker  . Smokeless tobacco: Never Used  Vaping Use  . Vaping Use: Never used  Substance Use Topics  . Alcohol use: No  . Drug use: Never     Allergies   Baclofen, Doxycycline, Percocet [oxycodone-acetaminophen], Septra [sulfamethoxazole-trimethoprim], and Gabapentin   Review of Systems Review of Systems  Constitutional: Negative for fatigue and fever.  HENT: Positive for congestion, postnasal drip and sinus pain. Negative for ear pain, sore throat, trouble swallowing and voice change.   Eyes: Negative for pain, redness and visual disturbance.  Respiratory: Positive for cough. Negative for chest tightness, shortness of breath and wheezing.   Cardiovascular: Negative for chest pain, palpitations and leg swelling.  Gastrointestinal: Negative for abdominal pain, diarrhea and vomiting.  Musculoskeletal: Negative for arthralgias and myalgias.  Skin: Negative for rash and wound.  Neurological: Negative for syncope and headaches.     Physical Exam Triage Vital Signs ED Triage Vitals  Enc Vitals Group     BP 08/05/20 1744 (!) 167/84     Pulse Rate 08/05/20 1744 73     Resp 08/05/20 1744 20     Temp 08/05/20 1744 98.7 F (37.1 C)     Temp Source 08/05/20 1744 Oral     SpO2 08/05/20 1744 97 %     Weight --      Height --      Head Circumference --      Peak Flow --      Pain Score 08/05/20 1815 0     Pain Loc --      Pain Edu? --      Excl. in [employer]? --    No data found.  Updated Vital Signs BP (!) 167/84 (BP Location: Left Arm)   Pulse 73   Temp 98.7 F (37.1 C) (Oral)   Resp 20   SpO2 97%   Visual Acuity Right Eye Distance:   Left Eye Distance:   Bilateral Distance:    Right Eye Near:   Left Eye Near:    Bilateral Near:     Physical Exam Constitutional:      General: She is not in acute distress.    Appearance: She is not ill-appearing or diaphoretic.    HENT:     Head: Normocephalic and atraumatic.     Right Ear: Tympanic membrane and ear canal normal.     Left Ear: Tympanic membrane and ear canal normal.     Nose:     Comments: btl turbinate edema (L>R) w/ sinus tenderness    Mouth/Throat:     Mouth: Mucous membranes are moist.     Pharynx: Oropharynx is clear. No oropharyngeal exudate or posterior oropharyngeal erythema.  Eyes:     General: No  scleral icterus.    Conjunctiva/sclera: Conjunctivae normal.     Pupils: Pupils are equal, round, and reactive to light.  Neck:     Comments: Trachea midline, negative JVD Cardiovascular:     Rate and Rhythm: Normal rate and regular rhythm.     Heart sounds: No murmur heard.  No gallop.   Pulmonary:     Effort: Pulmonary effort is normal. No respiratory distress.     Breath sounds: No wheezing, rhonchi or rales.  Musculoskeletal:     Cervical back: Neck supple. No tenderness.  Lymphadenopathy:     Cervical: No cervical adenopathy.  Skin:    Capillary Refill: Capillary refill takes less than 2 seconds.     Coloration: Skin is not jaundiced or pale.     Findings: No rash.  Neurological:     General: No focal deficit present.     Mental Status: She is alert and oriented to person, place, and time.      UC Treatments / Results  Labs (all labs ordered are listed, but only abnormal results are displayed) Labs Reviewed  NOVEL CORONAVIRUS, NAA    EKG   Radiology No results found.  Procedures Procedures (including critical care time)  Medications Ordered in UC Medications - No data to display  Initial Impression / Assessment and Plan / UC Course  I have reviewed the triage vital signs and the nursing notes.  Pertinent labs & imaging results that were available during my care of the patient were reviewed by me and considered in my medical decision making (see chart for details).     Patient afebrile, nontoxic, with SpO2 97%.  Covid PCR pending.  Patient to quarantine  until results are back.  We will treat supportively as outlined below, though would consider antibiotics if covid negative given sinus tenderness and swelling. Will avoid use of steroids as patient has frequent use thereof for chronic back pain.  Return precautions discussed, patient verbalized understanding and is agreeable to plan. Final Clinical Impressions(s) / UC Diagnoses   Final diagnoses:  Encounter for screening for COVID-19  URI with cough and congestion     Discharge Instructions     Your COVID test is pending - it is important to quarantine / isolate at home until your results are back. If you test positive and would like further evaluation for persistent or worsening symptoms, you may schedule an E-visit or virtual (video) visit throughout the [facility] app or website.  PLEASE NOTE: If you develop severe chest pain or shortness of breath please go to the ER or call 9-1-1 for further evaluation --> DO NOT schedule electronic or virtual visits for this. Please call our office for further guidance / recommendations as needed.  For information about the Covid vaccine, please visit [email or web address]    ED Prescriptions    Medication Sig Dispense Auth. Provider   benzonatate (TESSALON) 100 MG capsule Take 1 capsule (100 mg total) by mouth every 8 (eight) hours. 21 capsule Hall-Potvin, [place], PA-C     PDMP not reviewed this encounter.   Neldon Mc Trin[place], [place] 08/05/20 1925

## 2020-08-06 LAB — NOVEL CORONAVIRUS, NAA: SARS-CoV-2, NAA: NOT DETECTED

## 2020-08-06 LAB — SARS-COV-2, NAA 2 DAY TAT

## 2020-08-08 ENCOUNTER — Telehealth: Payer: Self-pay | Admitting: Emergency Medicine

## 2020-08-08 MED ORDER — AMOXICILLIN-POT CLAVULANATE 875-125 MG PO TABS
1.0000 | ORAL_TABLET | Freq: Two times a day (BID) | ORAL | 0 refills | Status: AC
Start: 2020-08-08 — End: 2020-08-15

## 2020-08-08 MED ORDER — FLUCONAZOLE 150 MG PO TABS
150.0000 mg | ORAL_TABLET | Freq: Every day | ORAL | 0 refills | Status: AC
Start: 2020-08-08 — End: 2020-08-10

## 2020-08-08 NOTE — Telephone Encounter (Signed)
Returned pt call sts was to have antibiotics called in if covid negative; ok to send per AY

## 2020-08-23 ENCOUNTER — Telehealth: Payer: Medicaid Other | Admitting: Family

## 2020-08-23 DIAGNOSIS — R109 Unspecified abdominal pain: Secondary | ICD-10-CM

## 2020-08-23 DIAGNOSIS — R399 Unspecified symptoms and signs involving the genitourinary system: Secondary | ICD-10-CM

## 2020-08-23 DIAGNOSIS — R509 Fever, unspecified: Secondary | ICD-10-CM

## 2020-08-23 MED ORDER — FLUCONAZOLE 150 MG PO TABS: 150.0000 mg | ORAL_TABLET | ORAL | 0 refills | Status: DC | PRN

## 2020-08-23 MED ORDER — CEPHALEXIN 500 MG PO CAPS
500.0000 mg | ORAL_CAPSULE | Freq: Two times a day (BID) | ORAL | 0 refills | Status: DC
Start: 2020-08-23 — End: 2020-12-15

## 2020-08-23 NOTE — Addendum Note (Signed)
Addended by: Evelina Dun A on: 08/23/2020 06:32 PM   Modules accepted: Orders

## 2020-08-23 NOTE — Progress Notes (Signed)

## 2020-08-23 NOTE — Progress Notes (Signed)
Based on what you shared with me, I feel your condition warrants further evaluation and I recommend that you be seen for a face to face office visit.   Given your symptoms with fever and back pain you need to be seen face to face to rule out a more serious infection.   NOTE: If you entered your credit card information for this eVisit, you will not be charged. You may see a "hold" on your card for the $35 but that hold will drop off and you will not have a charge processed.   If you are having a true medical emergency please call 911.      For an urgent face to face visit, [facility] has five urgent care centers for your convenience:     [facility] [place] [facility] [place] Get Driving Directions [phone] [address] [place], [place] [address] . 10 am - 6pm Monday - Friday    [facility]) Get Driving Directions [phone] [address] [place], [place] [address] . 10 am to 8 pm Monday-Friday . 12 pm to 8 pm [facility] Get Driving Directions [phone] [address] [place], [place], [place] [address] . 8 am to 8 pm Monday-Friday . 9 am to 6 pm Saturday . 11 am to 6 pm Sunday     [facility] at [facility] Get Driving Directions  [phone] [address], [place] [address] . 8 am to 8 pm Monday-Friday . 8 am to 4 pm [facility] [place] Get Driving Directions [phone] [address] [place], [place] [address] . 12 pm to 6 pm Monday-Friday      Your e-visit answers were reviewed by a board certified advanced clinical practitioner to complete your personal care plan.  Thank you for using e-Visits.

## 2020-08-24 ENCOUNTER — Ambulatory Visit: Payer: Self-pay

## 2020-09-16 ENCOUNTER — Telehealth: Payer: Medicaid Other | Admitting: Emergency Medicine

## 2020-09-16 DIAGNOSIS — J069 Acute upper respiratory infection, unspecified: Secondary | ICD-10-CM | POA: Diagnosis not present

## 2020-09-16 MED ORDER — FLUTICASONE PROPIONATE 50 MCG/ACT NA SUSP: 2.0000 | Freq: Every day | NASAL | 0 refills | Status: DC

## 2020-09-16 MED ORDER — BENZONATATE 100 MG PO CAPS: 100.0000 mg | ORAL_CAPSULE | Freq: Two times a day (BID) | ORAL | 0 refills | Status: DC | PRN

## 2020-09-16 NOTE — Progress Notes (Signed)
We are sorry you are not feeling well.  Here is how we plan to help!  Based on what you have shared with me, it looks like you may have a viral upper respiratory infection.  Upper respiratory infections are caused by a large number of viruses; however, rhinovirus is the most common cause.   Symptoms vary from person to person, with common symptoms including sore throat, cough, fatigue or lack of energy and feeling of general discomfort.  A low-grade fever of up to 100.4 may present, but is often uncommon.  Symptoms vary however, and are closely related to a person's age or underlying illnesses.  The most common symptoms associated with an upper respiratory infection are nasal discharge or congestion, cough, sneezing, headache and pressure in the ears and face.  These symptoms usually persist for about 3 to 10 days, but can last up to 2 weeks.  It is important to know that upper respiratory infections do not cause serious illness or complications in most cases.    Upper respiratory infections can be transmitted from person to person, with the most common method of transmission being a person's hands.  The virus is able to live on the skin and can infect other persons for up to 2 hours after direct contact.  Also, these can be transmitted when someone coughs or sneezes; thus, it is important to cover the mouth to reduce this risk.  To keep the spread of the illness at bay, good hand hygiene is very important.  This is an infection that is most likely caused by a virus. There are no specific treatments other than to help you with the symptoms until the infection runs its course.  We are sorry you are not feeling well.  Here is how we plan to help!   For nasal congestion, you may use an oral decongestants such as Mucinex D or if you have glaucoma or high blood pressure use plain Mucinex.  Saline nasal spray or nasal drops can help and can safely be used as often as needed for congestion.  For your congestion,  I have prescribed Fluticasone nasal spray one spray in each nostril twice a day  If you do not have a history of heart disease, hypertension, diabetes or thyroid disease, prostate/bladder issues or glaucoma, you may also use Sudafed to treat nasal congestion.  It is highly recommended that you consult with a pharmacist or your primary care physician to ensure this medication is safe for you to take.     If you have a cough, you may use cough suppressants such as Delsym and Robitussin.  If you have glaucoma or high blood pressure, you can also use Coricidin HBP.   For cough I have prescribed for you A prescription cough medication called Tessalon Perles 100 mg. You may take 1-2 capsules every 8 hours as needed for cough  If you have a sore or scratchy throat, use a saltwater gargle-  to  teaspoon of salt dissolved in a 4-ounce to 8-ounce glass of warm water.  Gargle the solution for approximately 15-30 seconds and then spit.  It is important not to swallow the solution.  You can also use throat lozenges/cough drops and Chloraseptic spray to help with throat pain or discomfort.  Warm or cold liquids can also be helpful in relieving throat pain.  For headache, pain or general discomfort, you can use Ibuprofen or Tylenol as directed.   Some authorities believe that zinc sprays or the use of   Echinacea may shorten the course of your symptoms.   HOME CARE . Only take medications as instructed by your medical team. . Be sure to drink plenty of fluids. Water is fine as well as fruit juices, sodas and electrolyte beverages. You may want to stay away from caffeine or alcohol. If you are nauseated, try taking small sips of liquids. How do you know if you are getting enough fluid? Your urine should be a pale yellow or almost colorless. . Get rest. . Taking a steamy shower or using a humidifier may help nasal congestion and ease sore throat pain. You can place a towel over your head and breathe in the steam  from hot water coming from a faucet. . Using a saline nasal spray works much the same way. . Cough drops, hard candies and sore throat lozenges may ease your cough. . Avoid close contacts especially the very young and the elderly . Cover your mouth if you cough or sneeze . Always remember to wash your hands.   GET HELP RIGHT AWAY IF: . You develop worsening fever. . If your symptoms do not improve within 10 days . You develop yellow or green discharge from your nose over 3 days. . You have coughing fits . You develop a severe head ache or visual changes. . You develop shortness of breath, difficulty breathing or start having chest pain . Your symptoms persist after you have completed your treatment plan  MAKE SURE YOU   Understand these instructions.  Will watch your condition.  Will get help right away if you are not doing well or get worse.  Your e-visit answers were reviewed by a board certified advanced clinical practitioner to complete your personal care plan. Depending upon the condition, your plan could have included both over the counter or prescription medications. Please review your pharmacy choice. If there is a problem, you may call our nursing hot line at and have the prescription routed to another pharmacy. Your safety is important to us. If you have drug allergies check your prescription carefully.   You can use MyChart to ask questions about today's visit, request a non-urgent call back, or ask for a work or school excuse for 24 hours related to this e-Visit. If it has been greater than 24 hours you will need to follow up with your provider, or enter a new e-Visit to address those concerns. You will get an e-mail in the next two days asking about your experience.  I hope that your e-visit has been valuable and will speed your recovery. Thank you for using e-visits.     Approximately 5 minutes was used in reviewing the patient's chart, questionnaire, prescribing  medications, and documentation.  

## 2020-09-20 ENCOUNTER — Ambulatory Visit
Admission: RE | Admit: 2020-09-20 | Discharge: 2020-09-20 | Disposition: A | Discharge status: Home / Self Care | Payer: Medicaid Other | Source: Ambulatory Visit | Attending: Emergency Medicine | Admitting: Emergency Medicine

## 2020-09-20 ENCOUNTER — Telehealth: Payer: Medicaid Other | Admitting: Physician Assistant

## 2020-09-20 ENCOUNTER — Other Ambulatory Visit: Payer: Self-pay

## 2020-09-20 VITALS — BP 123/85 | HR 98 | Temp 98.6°F | Resp 18

## 2020-09-20 DIAGNOSIS — R300 Dysuria: Secondary | ICD-10-CM

## 2020-09-20 DIAGNOSIS — R350 Frequency of micturition: Secondary | ICD-10-CM | POA: Diagnosis not present

## 2020-09-20 DIAGNOSIS — R1110 Vomiting, unspecified: Secondary | ICD-10-CM

## 2020-09-20 LAB — POCT URINALYSIS DIP (MANUAL ENTRY)
Bilirubin, UA: NEGATIVE
Glucose, UA: NEGATIVE mg/dL
Ketones, POC UA: NEGATIVE mg/dL
Nitrite, UA: NEGATIVE
Protein Ur, POC: 100 mg/dL — AB
Spec Grav, UA: 1.025 (ref 1.010–1.025)
Urobilinogen, UA: 0.2 E.U./dL
pH, UA: 6 (ref 5.0–8.0)

## 2020-09-20 MED ORDER — ONDANSETRON 4 MG PO TBDP: 4.0000 mg | ORAL_TABLET | Freq: Three times a day (TID) | ORAL | 0 refills | Status: DC | PRN

## 2020-09-20 MED ORDER — NITROFURANTOIN MONOHYD MACRO 100 MG PO CAPS: 100.0000 mg | ORAL_CAPSULE | Freq: Two times a day (BID) | ORAL | 0 refills | Status: AC

## 2020-09-20 NOTE — Discharge Instructions (Addendum)
Macrobid twice daily for 5 days Culture pending Push fluids Tylneol/ibuprofen for pain Zofran for nausea Follow up if not improving or worsening

## 2020-09-20 NOTE — Progress Notes (Signed)
Based on what you shared with me, I feel your condition warrants further evaluation and I recommend that you be seen for a face to face office visit. Vomiting in conjunction with a urinary tract infection can indicate a much more serious kidney infection and potentially a rapidly progressing infection.  You should be evaluated at an Urgent Care or in the ER this morning.   NOTE: If you entered your credit card information for this eVisit, you will not be charged. You may see a "hold" on your card for the $35 but that hold will drop off and you will not have a charge processed.   If you are having a true medical emergency please call 911.      For an urgent face to face visit, [place] has five urgent care centers for your convenience:     [facility] [place] [facility] [place] Get Driving Directions [phone] [address] [place], [place] [address] . 10 am - 6pm Monday - Friday    [place] [facility] [place] [facility]) Get Driving Directions [phone] [address] [place], [place] [address] . 10 am to 8 pm Monday-Friday . 12 pm to 8 pm [facility] Get Driving Directions [phone] [address], [place], [place] [address] . 8 am to 8 pm Monday-Friday . 9 am to 6 pm Saturday . 11 am to 6 pm Sunday     [facility] at [facility] Get Driving Directions  [phone] [address] [place], [place] [address] . 8 am to 8 pm Monday-Friday . 8 am to 4 pm [facility] at [place] Get Driving Directions [phone] [address] [place], [place] [address] . 12 pm to 6 pm Monday-Friday      Your e-visit answers were reviewed by a board certified advanced clinical practitioner to complete your personal care plan.  Thank you for using e-Visits.     Greater than 5 minutes, yet less than 10 minutes of time  have been spent researching, coordinating, and implementing care for this patient today

## 2020-09-20 NOTE — ED Provider Notes (Signed)
[facility]    CSN: [id number] Arrival date & time: 09/20/20  1651      History   Chief Complaint Chief Complaint  Patient presents with  . Emesis    HPI Mary Chambers is a 49 y.o. female history of hypertension presenting today for evaluation of vomiting and chills. Reports urinary symptoms of frequency, urgency and chills on Friday/saturday. Increased discomfort on right lower back/flank. Nausea and vomiting last night. Denies fevers. Denies URI symptoms. Reports history of prior UTI symptoms, feels similar.  Has had prior kidney stones, but does not feel similar.  HPI  Past Medical History:  Diagnosis Date  . Hypertension   . Kidney stone     Patient Active Problem List   Diagnosis Date Noted  . Herniated lumbar disc without myelopathy 10/02/2019  . Lumbar spinal stenosis 10/01/2019  . Pain in left foot 03/29/2018  . Arthritis 02/13/2018  . Chronic back pain 02/13/2018  . Migraines 02/13/2018  . Morbid obesity with BMI of 40.0-44.9, adult ([place]) 02/13/2018  . Synovial cyst of lumbar spine 10/15/2017  . Osteoarthritis of knee 07/13/2017  . Lumbar disc disease with radiculopathy 08/01/2016  . Fatigue 05/04/2016  . Headache 05/04/2016  . Numbness of foot 05/04/2016  . 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 Arthrodesis status 02/02/2016  . Chronic pain syndrome 02/02/2016  . Elevated white blood cell count 12/01/2015  . Lumbar stenosis with neurogenic claudication 05/07/2015  . Bilateral leg edema 04/21/2015  . Carpal tunnel syndrome of right wrist 12/14/2014  . Renal atrophy, left 01/23/2014  . Achilles tendonitis 01/16/2014  . Right lower quadrant pain 12/18/2013  . Hypertension, benign 06/03/2010  . Urinary incontinence 06/03/2010  . Cervical disc disease 09/09/2009  . 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 ABDOMINAL HYSTERECTOMY    . BACK SURGERY    . CARPAL TUNNEL RELEASE    .  CHOLECYSTECTOMY    . LITHOTRIPSY    . neck fusion    . TUBAL LIGATION      OB History   No obstetric history on file.      Home Medications    Prior to Admission medications   Medication Sig Start Date End Date Taking? Authorizing Provider  enalapril (VASOTEC) 10 MG tablet Take 10 mg by mouth daily.   Yes [provider]  hydrochlorothiazide (HYDRODIURIL) 25 MG tablet Take 25 mg by mouth daily.   Yes [provider]  ipratropium (ATROVENT) 0.06 % nasal spray Place 2 sprays into both nostrils 4 (four) times daily. 05/17/20  Yes Yu, Amy V, PA-C  pantoprazole (PROTONIX) 20 MG tablet Take 1 tablet (20 mg total) by mouth daily. 04/22/20  Yes Quintella Reichert, MD  POTASSIUM PO Take 1,000 mg by mouth daily.   Yes [provider]  Ascorbic Acid (VITAMIN C) 500 MG CAPS Take 500 mg by mouth daily.    [provider]  benzonatate (TESSALON) 100 MG capsule Take 1 capsule (100 mg total) by mouth 2 (two) times daily as needed for cough. 09/16/20   Montine Circle, PA-C  cephALEXin (KEFLEX) 500 MG capsule Take 1 capsule (500 mg total) by mouth 2 (two) times daily. 08/23/20   Sharion Balloon, FNP  cetirizine (ZYRTEC) 10 MG chewable tablet Chew 1 tablet (10 mg total) by mouth daily. 05/06/19 06/05/19  Sharion Balloon, NP  cholecalciferol (VITAMIN D3) 25 MCG (1000 UT) tablet Take 1,000 Units by mouth daily.  [provider]  fluconazole (DIFLUCAN) 150 MG tablet Take 1 tablet (150 mg total) by mouth every three (3) days as needed. 08/23/20   Sharion Balloon, FNP  fluticasone (FLONASE) 50 MCG/ACT nasal spray Place 2 sprays into both nostrils daily. 09/16/20   Montine Circle, PA-C  Multiple Vitamin (MULTIVITAMIN) capsule Take 1 capsule by mouth daily.     [provider]  nitrofurantoin, macrocrystal-monohydrate, (MACROBID) 100 MG capsule Take 1 capsule (100 mg total) by mouth 2 (two) times daily for 5 days. 09/20/20 09/25/20  Yochanan Eddleman C, PA-C   ondansetron (ZOFRAN ODT) 4 MG disintegrating tablet Take 1 tablet (4 mg total) by mouth every 8 (eight) hours as needed for nausea or vomiting. 09/20/20   Lizeth Bencosme C, PA-C  tiZANidine (ZANAFLEX) 2 MG tablet Take 2-4 mg by mouth 2 (two) times daily as needed for muscle spasms (sleep).  04/11/20   [provider]  diphenhydrAMINE (BENADRYL) 25 mg capsule Take by mouth. 03/10/16 06/21/19  [provider]  LYRICA 50 MG capsule Take 50 mg by mouth 3 (three) times daily. 04/15/18 06/07/19  [provider]    Family History Family History  Problem Relation Age of Onset  . Diabetes Mother   . Hypertension Mother   . Cancer Father   . Parkinson's disease Father     Social History Social History   Tobacco Use  . Smoking status: Never Smoker  . Smokeless tobacco: Never Used  Vaping Use  . Vaping Use: Never used  Substance Use Topics  . Alcohol use: No  . Drug use: Never     Allergies   Baclofen, Doxycycline, Percocet [oxycodone-acetaminophen], Septra [sulfamethoxazole-trimethoprim], and Gabapentin   Review of Systems Review of Systems  Constitutional: Positive for chills. Negative for activity change, appetite change, fatigue and fever.  HENT: Negative for congestion, ear pain, rhinorrhea, sinus pressure, sore throat and trouble swallowing.   Eyes: Negative for discharge and redness.  Respiratory: Negative for cough, chest tightness and shortness of breath.   Cardiovascular: Negative for chest pain.  Gastrointestinal: Positive for nausea and vomiting. Negative for abdominal pain and diarrhea.  Genitourinary: Positive for frequency and urgency. Negative for difficulty urinating and hematuria.  Musculoskeletal: Negative for myalgias.  Skin: Negative for rash.  Neurological: Negative for dizziness, light-headedness and headaches.     Physical Exam Triage Vital Signs ED Triage Vitals  Enc Vitals Group     BP      Pulse      Resp      Temp       Temp src      SpO2      Weight      Height      Head Circumference      Peak Flow      Pain Score      Pain Loc      Pain Edu?      Excl. in [place]?    No data found.  Updated Vital Signs BP 123/85 (BP Location: Left Wrist)   Pulse 98   Temp 98.6 F (37 C) (Oral)   Resp 18   SpO2 96%   Visual Acuity Right Eye Distance:   Left Eye Distance:   Bilateral Distance:    Right Eye Near:   Left Eye Near:    Bilateral Near:     Physical Exam Vitals and nursing note reviewed.  Constitutional:      Appearance: She is well-developed and well-nourished.  Comments: No acute distress  HENT:     Head: Normocephalic and atraumatic.     Nose: Nose normal.  Eyes:     Conjunctiva/sclera: Conjunctivae normal.  Cardiovascular:     Rate and Rhythm: Normal rate and regular rhythm.  Pulmonary:     Effort: Pulmonary effort is normal. No respiratory distress.     Comments: Breathing comfortably at rest, CTABL, no wheezing, rales or other adventitious sounds auscultated Abdominal:     General: There is no distension.     Comments: Soft, nondistended, nontender to palpation throughout abdomen  Musculoskeletal:        General: Normal range of motion.     Cervical back: Neck supple.     Comments: Right lower back with tenderness to palpation on lumbar area, no midline tenderness  Skin:    General: Skin is warm and dry.  Neurological:     Mental Status: She is alert and oriented to person, place, and time.  Psychiatric:        Mood and Affect: Mood and affect normal.      UC Treatments / Results  Labs (all labs ordered are listed, but only abnormal results are displayed) Labs Reviewed  POCT URINALYSIS DIP (MANUAL ENTRY) - Abnormal; Notable for the following components:      Result Value   Blood, UA trace-intact (*)    Protein Ur, POC =100 (*)    Leukocytes, UA Trace (*)    All other components within normal limits  URINE CULTURE    EKG   Radiology No results  found.  Procedures Procedures (including critical care time)  Medications Ordered in UC Medications - No data to display  Initial Impression / Assessment and Plan / UC Course  I have reviewed the triage vital signs and the nursing notes.  Pertinent labs & imaging results that were available during my care of the patient were reviewed by me and considered in my medical decision making (see chart for details).     UA with trace leuks and trace hemoglobin, discussed with patient results minimally suggestive of UTI, but given patient's reported symptoms and feel similar to prior UTIs opting to go ahead and start on Macrobid, urine culture pending.  Will have patient stop antibiotics if culture negative.  Push fluids.  Monitor for any further signs of possible stone.  Zofran for nausea.  Discussed strict return precautions. Patient verbalized understanding and is agreeable with plan.  Final Clinical Impressions(s) / UC Diagnoses   Final diagnoses:  Urinary frequency     Discharge Instructions     Macrobid twice daily for 5 days Culture pending Push fluids Tylneol/ibuprofen for pain Zofran for nausea Follow up if not improving or worsening    ED Prescriptions    Medication Sig Dispense Auth. Provider   nitrofurantoin, macrocrystal-monohydrate, (MACROBID) 100 MG capsule Take 1 capsule (100 mg total) by mouth 2 (two) times daily for 5 days. 10 capsule Chay Mazzoni C, PA-C   ondansetron (ZOFRAN ODT) 4 MG disintegrating tablet Take 1 tablet (4 mg total) by mouth every 8 (eight) hours as needed for nausea or vomiting. 20 tablet Savilla Turbyfill, [place] C, PA-C     PDMP not reviewed this encounter.   Janith Lima, [place] 09/20/20 1750

## 2020-09-20 NOTE — ED Triage Notes (Addendum)
Pt c/o nausea, dark urine, urinary frequency, urgency, chills, lack of appetite onset Saturday.  Right flank discomfort onset Friday.  One episode of vomiting last night. Pt reports increased thirst onset yesterday.  Reports one incidence of coughing today that pt attributes to dusty work environment and states when she coughed, she had a HA occur. States has been drinking orange juice heavily today.   Denies fever, abdominal pain, congestion, sore throat, runny nose, persistent cough, SOB.  Last took tylenol at 0900 today.

## 2020-09-20 NOTE — Progress Notes (Signed)
Repeat e-visit for UTI after pt was referred for face to face evaluation given concerning symptoms. Referred again for face to face evaluation  "I appreciate your desire to be well and treat your symptoms.  I understand a face-to-face evaluation is a hassle and takes time, however I firmly believe it is in your best interest to have a physical exam, urinalysis and possibly bloodwork given your symptoms.  Your safety is of upmost importance to [place] and we want to ensure you receive the best care possible.  I would recommend an evaluation at an urgent care today."   Greater than 5 minutes, yet less than 10 minutes of time have been spent researching, coordinating, and implementing care for this patient today

## 2020-09-22 LAB — URINE CULTURE

## 2020-09-23 ENCOUNTER — Ambulatory Visit: Payer: Medicaid Other | Admitting: Podiatry

## 2020-10-26 ENCOUNTER — Telehealth: Payer: Medicaid Other | Admitting: Nurse Practitioner

## 2020-10-26 DIAGNOSIS — Z87440 Personal history of urinary (tract) infections: Secondary | ICD-10-CM

## 2020-10-26 NOTE — Progress Notes (Signed)
Based on what you shared with me it looks like you have urinary symptoms.,that should be evaluated in a face to face office visit. According to your chart this has ben a reoccurring problem over the last  several months. You will need a face to face visit with urinalysis and urine culture.    NOTE: If you entered your credit card information for this eVisit, you will not be charged. You may see a "hold" on your card for the $35 but that hold will drop off and you will not have a charge processed.  If you are having a true medical emergency please call 911.     For an urgent face to face visit, [place] has four urgent care centers for your convenience:   . [facility]    [phone]                  Get Driving Directions  [address], [place] [address] . 10 am to 8 pm Monday-Friday . 12 pm to 8 pm Saturday-Sunday   . [facility] at [place]                  Get Driving Directions  [address], [place], [place] [address] . 8 am to 8 pm Monday-Friday . 9 am to 6 pm Saturday . 11 am to 6 pm Sunday   . [facility] at [place]                  Get Driving Directions   [address] [place], [place] [address] . 8 am to 8 pm Monday-Friday . 8 am to 4 pm Saturday-Sunday    . [facility] at [place]                    Get Driving Directions  [phone]  [address], [place], [place] [address]  . Monday-Friday, 12 PM to 6 PM    Your e-visit answers were reviewed by a board certified advanced clinical practitioner to complete your personal care plan.  Thank you for using e-Visits.

## 2020-11-06 ENCOUNTER — Encounter: Payer: Self-pay | Admitting: Physician Assistant

## 2020-11-06 ENCOUNTER — Telehealth: Payer: 59 | Admitting: Physician Assistant

## 2020-11-06 DIAGNOSIS — Z20822 Contact with and (suspected) exposure to covid-19: Secondary | ICD-10-CM | POA: Diagnosis not present

## 2020-11-06 DIAGNOSIS — R0981 Nasal congestion: Secondary | ICD-10-CM

## 2020-11-06 MED ORDER — FLUTICASONE PROPIONATE 50 MCG/ACT NA SUSP: 2.0000 | Freq: Every day | NASAL | 6 refills | Status: DC

## 2020-11-06 NOTE — Progress Notes (Signed)
E-Visit for Corona Virus Screening  Your current symptoms could be consistent with the coronavirus.  Many health care providers can now test patients at their office but not all are.  [facility] has multiple testing sites. For information on our [place] testing locations and hours go to [email or web address]  We are enrolling you in our [place] for [place] . Daily you will receive a questionnaire within the [facility] website. Our COVID 19 response team will be monitoring your responses daily.  Testing Information: The COVID-19 Community Testing sites are testing BY APPOINTMENT ONLY.  You can schedule online at [email or web address]  If you do not have access to a smart phone or computer you may call [phone] for an appointment.   Additional testing sites in the Community:  . For [facility] Testing sites in [facility]  [email or web address]  . For Pop-up testing sites in [place]  [email or web address]  . For [facility] [email or web address]  . For [facility] testing in [place] and [employer] [email or web address]  . For [facility] testing in [facility]   [email or web address]  For  more information about community testing call [phone]   Please quarantine yourself while awaiting your test results. Please stay home for a minimum of 10 days from the first day of illness with improving symptoms and you have had 24 hours of no fever (without the use of Tylenol (Acetaminophen) Motrin (Ibuprofen) or any fever reducing medication).  Also - Do not get tested prior to returning to work because once you have had a positive test the test can stay  positive for more than a month in some cases.   You should wear a mask or cloth face covering over your nose and mouth if you must be around other people or animals, including pets (even at home). Try to stay at least 6 feet away from other people. This will protect the people around you.  Please continue good preventive care measures, including:  frequent hand-washing, avoid touching your face, cover coughs/sneezes, stay out of crowds and keep a 6 foot distance from others.  COVID-19 is a respiratory illness with symptoms that are similar to the flu. Symptoms are typically mild to moderate, but there have been cases of severe illness and death due to the virus.   The following symptoms may appear 2-14 days after exposure: . Fever . Cough . Shortness of breath or difficulty breathing . Chills . Repeated shaking with chills . Muscle pain . Headache . Sore throat . New loss of taste or smell . Fatigue . Congestion or runny nose . Nausea or vomiting . Diarrhea  Go to the nearest hospital ED for assessment if fever/cough/breathlessness are severe or illness seems like a threat to life.  It is vitally important that if you feel that you have an infection such as this virus or any other virus that you stay home and away from places where you may spread it to others.  You should avoid contact with people age 16 and older.   You can use medication such as A prescription for Fluticasone nasal spray 2 sprays in each nostril one time per day  You may also take acetaminophen (Tylenol) as needed for fever.  Reduce your risk of any infection by using the same precautions used for avoiding the common cold or flu:  Marland Kitchen Wash your hands often with soap and warm water for at least 20 seconds.  If soap and water are not readily available, use an alcohol-based hand  sanitizer with at least 60% alcohol.  . If coughing or sneezing, cover your mouth and nose by coughing or sneezing into the elbow areas of your shirt  or coat, into a tissue or into your sleeve (not your hands). . Avoid shaking hands with others and consider head nods or verbal greetings only. . Avoid touching your eyes, nose, or mouth with unwashed hands.  . Avoid close contact with people who are sick. . Avoid places or events with large numbers of people in one location, like concerts or sporting events. . Carefully consider travel plans you have or are making. . If you are planning any travel outside or inside the [place], visit the CDC's Travelers' Health webpage for the latest health notices. . If you have some symptoms but not all symptoms, continue to monitor at home and seek medical attention if your symptoms worsen. . If you are having a medical emergency, call 911.  HOME CARE . Only take medications as instructed by your medical team. . Drink plenty of fluids and get plenty of rest. . A steam or ultrasonic humidifier can help if you have congestion.   GET HELP RIGHT AWAY IF YOU HAVE EMERGENCY WARNING SIGNS** FOR COVID-19. If you or someone is showing any of these signs seek emergency medical care immediately. Call 911 or proceed to your closest emergency facility if: . You develop worsening high fever. . Trouble breathing . Bluish lips or face . Persistent pain or pressure in the chest . New confusion . Inability to wake or stay awake . You cough up blood. . Your symptoms become more severe  **This list is not all possible symptoms. Contact your medical provider for any symptoms that are sever or concerning to you.  MAKE SURE YOU   Understand these instructions.  Will watch your condition.  Will get help right away if you are not doing well or get worse.  Your e-visit answers were reviewed by a board certified advanced clinical practitioner to complete your personal care plan.  Depending on the condition, your plan could have included both over the counter or prescription medications.  If there is a problem please reply  once you have received a response from your provider.  Your safety is important to [place].  If you have drug allergies check your prescription carefully.    You can use MyChart to ask questions about today's visit, request a non-urgent call back, or ask for a work or school excuse for 24 hours related to this e-Visit. If it has been greater than 24 hours you will need to follow up with your provider, or enter a new e-Visit to address those concerns. You will get an e-mail in the next two days asking about your experience.  I hope that your e-visit has been valuable and will speed your recovery. Thank you for using e-visits.   I spent 5-10 minutes on review and completion of this note- Lacy [facility]

## 2020-11-29 ENCOUNTER — Encounter: Payer: Self-pay | Admitting: Gastroenterology

## 2020-12-15 ENCOUNTER — Encounter: Payer: Self-pay | Admitting: Gastroenterology

## 2020-12-15 ENCOUNTER — Ambulatory Visit: Payer: Medicaid Other | Admitting: Gastroenterology

## 2020-12-15 VITALS — BP 126/74 | HR 72 | Ht 67.0 in | Wt 278.0 lb

## 2020-12-15 DIAGNOSIS — R110 Nausea: Secondary | ICD-10-CM | POA: Insufficient documentation

## 2020-12-15 DIAGNOSIS — Z8711 Personal history of peptic ulcer disease: Secondary | ICD-10-CM

## 2020-12-15 DIAGNOSIS — Z1212 Encounter for screening for malignant neoplasm of rectum: Secondary | ICD-10-CM

## 2020-12-15 DIAGNOSIS — R112 Nausea with vomiting, unspecified: Secondary | ICD-10-CM | POA: Insufficient documentation

## 2020-12-15 DIAGNOSIS — Z1211 Encounter for screening for malignant neoplasm of colon: Secondary | ICD-10-CM

## 2020-12-15 DIAGNOSIS — K219 Gastro-esophageal reflux disease without esophagitis: Secondary | ICD-10-CM | POA: Insufficient documentation

## 2020-12-15 MED ORDER — PLENVU 140 G PO SOLR: ORAL | 0 refills | Status: DC

## 2020-12-15 NOTE — Progress Notes (Signed)
Agree with assessment and plan as outlined.  

## 2020-12-15 NOTE — Progress Notes (Signed)
12/15/2020 Mary Chambers [id number] [birth year]   HISTORY OF PRESENT ILLNESS: This is a 50 year old female who is new to our office.  She is here today to discuss a colonoscopy for colorectal cancer screening.  She tells me that she had a colonoscopy over 20 years ago through [facility] when she was having some GI issues.  From what she can recall it was normal and she was told that she has IBS.  She reports that for the most part she moves her bowels well.  Denies any rectal bleeding.  She has experienced some rectal discomfort just recently with having bowel movements.  She also wanted to discuss an endoscopy.  She says that a few years ago she was going to have some type of weight loss surgery and the physician that she was working with through [place] performed an EGD.  She was told she had ulcers and supposed to have a repeat EGD in a couple of months.  She ended up not having any type of weight loss surgery and had relocated to [place] for short period of time.  Nonetheless, she never had a repeat EGD.  She is on pantoprazole 20 mg daily.  She reports frequently feeling sick to her stomach/nauseated.  She has belching and heartburn/reflux despite the medication.  Referred here by [name], NP, in order to discuss EGD and colonoscopy   Past Medical History:  Diagnosis Date  . Arthritis   . Depression   . Hypertension   . Kidney stone   . Obesity    Past Surgical History:  Procedure Laterality Date  . ABDOMINAL HYSTERECTOMY    . BACK SURGERY    . CARPAL TUNNEL RELEASE    . CHOLECYSTECTOMY    . LITHOTRIPSY    . neck fusion    . TUBAL LIGATION      reports that she has never smoked. She has never used smokeless tobacco. She reports that she does not drink alcohol and does not use drugs. family history includes Cancer in her father; Diabetes in her mother; Hypertension in her mother; Parkinson's disease in her father. Allergies  Allergen Reactions  .  Baclofen Other (See Comments)  . Doxycycline Itching  . Percocet [Oxycodone-Acetaminophen] Hives  . Septra [Sulfamethoxazole-Trimethoprim] Hives  . Gabapentin Rash      Outpatient Encounter Medications as of 12/15/2020  Medication Sig  . Ascorbic Acid (VITAMIN C) 500 MG CAPS Take 500 mg by mouth daily.  . cholecalciferol (VITAMIN D3) 25 MCG (1000 UT) tablet Take 1,000 Units by mouth daily.  . enalapril (VASOTEC) 10 MG tablet Take 10 mg by mouth daily.  . hydrochlorothiazide (HYDRODIURIL) 25 MG tablet Take 25 mg by mouth daily.  . Multiple Vitamin (MULTIVITAMIN) capsule Take 1 capsule by mouth daily.   . ondansetron (ZOFRAN ODT) 4 MG disintegrating tablet Take 1 tablet (4 mg total) by mouth every 8 (eight) hours as needed for nausea or vomiting.  . pantoprazole (PROTONIX) 20 MG tablet Take 1 tablet (20 mg total) by mouth daily.  Marland Kitchen POTASSIUM PO Take 1,000 mg by mouth daily.  Marland Kitchen tiZANidine (ZANAFLEX) 2 MG tablet Take 2-4 mg by mouth 2 (two) times daily as needed for muscle spasms (sleep).   . [DISCONTINUED] benzonatate (TESSALON) 100 MG capsule Take 1 capsule (100 mg total) by mouth 2 (two) times daily as needed for cough.  . [DISCONTINUED] cephALEXin (KEFLEX) 500 MG capsule Take 1 capsule (500 mg total) by mouth 2 (two) times  daily.  . [DISCONTINUED] cetirizine (ZYRTEC) 10 MG chewable tablet Chew 1 tablet (10 mg total) by mouth daily.  . [DISCONTINUED] diphenhydrAMINE (BENADRYL) 25 mg capsule Take by mouth.  . [DISCONTINUED] fluconazole (DIFLUCAN) 150 MG tablet Take 1 tablet (150 mg total) by mouth every three (3) days as needed.  . [DISCONTINUED] fluticasone (FLONASE) 50 MCG/ACT nasal spray Place 2 sprays into both nostrils daily.  . [DISCONTINUED] fluticasone (FLONASE) 50 MCG/ACT nasal spray Place 2 sprays into both nostrils daily.  . [DISCONTINUED] ipratropium (ATROVENT) 0.06 % nasal spray Place 2 sprays into both nostrils 4 (four) times daily.  . [DISCONTINUED] LYRICA 50 MG capsule Take 50  mg by mouth 3 (three) times daily.   No facility-administered encounter medications on file as of 12/15/2020.     REVIEW OF SYSTEMS  : All other systems reviewed and negative except where noted in the History of Present Illness.   PHYSICAL EXAM: BP 126/74   Pulse 72   Ht 5\' 7"  (1.702 m)   Wt 278 lb (126.1 kg)   BMI 43.54 kg/m  General: Well developed white female in no acute distress Head: Normocephalic and atraumatic Eyes:  Sclerae anicteric, conjunctiva pink. Ears: Normal auditory acuity Lungs: Clear throughout to auscultation; no W/R/R. Heart: Regular rate and rhythm; no M/R/G. Abdomen: Soft, non-distended.  BS present.  Non-tender. Rectal:  Will be done at the time of colonoscopy. Musculoskeletal: Symmetrical with no gross deformities  Skin: No lesions on visible extremities Extremities: No edema  Neurological: Alert oriented x 4, grossly non-focal Psychological:  Alert and cooperative. Normal mood and affect  ASSESSMENT AND PLAN: *CRC screening:  Had colonoscopy over 20 years ago.  Will plan for colonoscopy with Dr. Havery Moros. *GERD, nausea, history of gastric ulcers:  Reports ulcers a few years ago when she had an EGD to get ready for weight loss surgery.  Says that she was supposed to have a follow-up EGD, but then relocated to never had that done.  We will plan for EGD as well.  She is only on pantoprazole 20 mg daily.  We will increase that to 40 mg daily.  **The risks, benefits, and alternatives to EGD and colonoscopy were discussed with the patient and she consents to proceed.    CC:  Jeraldine Loots, MD  CC:  [name], NP

## 2020-12-15 NOTE — Patient Instructions (Signed)
If you are age 51 or older, your body mass index should be between 23-30. Your Body mass index is 43.54 kg/m. If this is out of the aforementioned range listed, please consider follow up with your Primary Care Provider.  If you are age 92 or younger, your body mass index should be between 19-25. Your Body mass index is 43.54 kg/m. If this is out of the aformentioned range listed, please consider follow up with your Primary Care Provider.   You have been scheduled for an endoscopy and colonoscopy. Please follow the written instructions given to you at your visit today. Please pick up your prep supplies at the pharmacy within the next 1-3 days. If you use inhalers (even only as needed), please bring them with you on the day of your procedure.  Please increase your pantoprazole to 40 mg daily  Thank you for choosing me and [facility].  Alonza Bogus, PA-C

## 2021-01-25 ENCOUNTER — Telehealth: Payer: Medicaid Other | Admitting: Family

## 2021-01-25 DIAGNOSIS — R399 Unspecified symptoms and signs involving the genitourinary system: Secondary | ICD-10-CM | POA: Diagnosis not present

## 2021-01-26 MED ORDER — CEPHALEXIN 500 MG PO CAPS: 500.0000 mg | ORAL_CAPSULE | Freq: Two times a day (BID) | ORAL | 0 refills | Status: DC

## 2021-01-26 NOTE — Progress Notes (Signed)

## 2021-01-30 ENCOUNTER — Telehealth: Payer: Medicaid Other | Admitting: Nurse Practitioner

## 2021-01-30 DIAGNOSIS — R110 Nausea: Secondary | ICD-10-CM

## 2021-01-30 MED ORDER — ONDANSETRON 4 MG PO TBDP: 4.0000 mg | ORAL_TABLET | Freq: Three times a day (TID) | ORAL | 0 refills | Status: DC | PRN

## 2021-01-30 NOTE — Progress Notes (Signed)
We are sorry that you are not feeling well. Here is how we plan to help!  Based on what you have shared with me it looks like you have a Virus that is irritating your GI tract.  Vomiting is the forceful emptying of a portion of the stomach's content through the mouth.  Although nausea and vomiting can make you feel miserable, it's important to remember that these are not diseases, but rather symptoms of an underlying illness.  When we treat short term symptoms, we always caution that any symptoms that persist should be fully evaluated in a medical office.  I have prescribed a medication that will help alleviate your symptoms and allow you to stay hydrated:  Zofran 4 mg 1 tablet every 8 hours as needed for nausea and vomiting  HOME CARE:  Drink clear liquids.  This is very important! Dehydration (the lack of fluid) can lead to a serious complication.  Start off with 1 tablespoon every 5 minutes for 8 hours.  You may begin eating bland foods after 8 hours without vomiting.  Start with saltine crackers, white bread, rice, mashed potatoes, applesauce.  After 48 hours on a bland diet, you may resume a normal diet.  Try to go to sleep.  Sleep often empties the stomach and relieves the need to vomit.  GET HELP RIGHT AWAY IF:   Your symptoms do not improve or worsen within 2 days after treatment.  You have a fever for over 3 days.  You cannot keep down fluids after trying the medication.  MAKE SURE YOU:   Understand these instructions.  Will watch your condition.  Will get help right away if you are not doing well or get worse.   Thank you for choosing an e-visit. Your e-visit answers were reviewed by a board certified advanced clinical practitioner to complete your personal care plan. Depending upon the condition, your plan could have included both over the counter or prescription medications. Please review your pharmacy choice. Be sure that the pharmacy you have chosen is open so  that you can pick up your prescription now.  If there is a problem you may message your provider in [place] to have the prescription routed to another pharmacy. Your safety is important to [place]. If you have drug allergies check your prescription carefully.  For the next 24 hours, you can use MyChart to ask questions about today's visit, request a non-urgent call back, or ask for a work or school excuse from your e-visit provider. You will get an e-mail in the next two days asking about your experience. I hope that your e-visit has been valuable and will speed your recovery.  I have spent at least 5 minutes reviewing and documenting in the patient's chart.

## 2021-02-04 ENCOUNTER — Ambulatory Visit (AMBULATORY_SURGERY_CENTER): Payer: 59 | Admitting: Gastroenterology

## 2021-02-04 ENCOUNTER — Other Ambulatory Visit: Payer: Self-pay

## 2021-02-04 ENCOUNTER — Other Ambulatory Visit: Payer: Self-pay | Admitting: Gastroenterology

## 2021-02-04 ENCOUNTER — Encounter: Payer: Self-pay | Admitting: Gastroenterology

## 2021-02-04 VITALS — BP 135/87 | HR 85 | Temp 98.2°F | Resp 21 | Ht 67.0 in | Wt 278.0 lb

## 2021-02-04 DIAGNOSIS — Z1212 Encounter for screening for malignant neoplasm of rectum: Secondary | ICD-10-CM

## 2021-02-04 DIAGNOSIS — D122 Benign neoplasm of ascending colon: Secondary | ICD-10-CM

## 2021-02-04 DIAGNOSIS — D120 Benign neoplasm of cecum: Secondary | ICD-10-CM | POA: Diagnosis not present

## 2021-02-04 DIAGNOSIS — Z1211 Encounter for screening for malignant neoplasm of colon: Secondary | ICD-10-CM | POA: Diagnosis not present

## 2021-02-04 DIAGNOSIS — Z8711 Personal history of peptic ulcer disease: Secondary | ICD-10-CM | POA: Diagnosis not present

## 2021-02-04 DIAGNOSIS — K219 Gastro-esophageal reflux disease without esophagitis: Secondary | ICD-10-CM | POA: Diagnosis present

## 2021-02-04 DIAGNOSIS — K2970 Gastritis, unspecified, without bleeding: Secondary | ICD-10-CM

## 2021-02-04 DIAGNOSIS — K2950 Unspecified chronic gastritis without bleeding: Secondary | ICD-10-CM | POA: Diagnosis not present

## 2021-02-04 MED ORDER — SODIUM CHLORIDE 0.9 % IV SOLN: 500.0000 mL | Freq: Once | INTRAVENOUS | Status: DC

## 2021-02-04 NOTE — Progress Notes (Signed)
A and O x3. Report to RN. Tolerated MAC anesthesia well.Teeth unchanged after procedure.

## 2021-02-04 NOTE — Patient Instructions (Signed)
Impression/Recommendations:  Polyp, diverticulosis, and hemorrhoid handouts given to patient.  Resume previous diet. Continue present medications. Await pathology results.  YOU HAD AN ENDOSCOPIC PROCEDURE TODAY AT [facility]:   Refer to the procedure report that was given to you for any specific questions about what was found during the examination.  If the procedure report does not answer your questions, please call your gastroenterologist to clarify.  If you requested that your care partner not be given the details of your procedure findings, then the procedure report has been included in a sealed envelope for you to review at your convenience later.  YOU SHOULD EXPECT: Some feelings of bloating in the abdomen. Passage of more gas than usual.  Walking can help get rid of the air that was put into your GI tract during the procedure and reduce the bloating. If you had a lower endoscopy (such as a colonoscopy or flexible sigmoidoscopy) you may notice spotting of blood in your stool or on the toilet paper. If you underwent a bowel prep for your procedure, you may not have a normal bowel movement for a few days.  Please Note:  You might notice some irritation and congestion in your nose or some drainage.  This is from the oxygen used during your procedure.  There is no need for concern and it should clear up in a day or so.  SYMPTOMS TO REPORT IMMEDIATELY:   Following lower endoscopy (colonoscopy or flexible sigmoidoscopy):  Excessive amounts of blood in the stool  Significant tenderness or worsening of abdominal pains  Swelling of the abdomen that is new, acute  Fever of 100F or higher   Following upper endoscopy (EGD)  Vomiting of blood or coffee ground material  New chest pain or pain under the shoulder blades  Painful or persistently difficult swallowing  New shortness of breath  Fever of 100F or higher  Black, tarry-looking stools  For urgent or emergent issues,  a gastroenterologist can be reached at any hour by calling [phone]. Do not use MyChart messaging for urgent concerns.    DIET:  We do recommend a small meal at first, but then you may proceed to your regular diet.  Drink plenty of fluids but you should avoid alcoholic beverages for 24 hours.  ACTIVITY:  You should plan to take it easy for the rest of today and you should NOT DRIVE or use heavy machinery until tomorrow (because of the sedation medicines used during the test).    FOLLOW UP: Our staff will call the number listed on your records 48-72 hours following your procedure to check on you and address any questions or concerns that you may have regarding the information given to you following your procedure. If we do not reach you, we will leave a message.  We will attempt to reach you two times.  During this call, we will ask if you have developed any symptoms of COVID 19. If you develop any symptoms (ie: fever, flu-like symptoms, shortness of breath, cough etc.) before then, please call [phone].  If you test positive for Covid 19 in the 2 weeks post procedure, please call and report this information to [place].    If any biopsies were taken you will be contacted by phone or by letter within the next 1-3 weeks.  Please call us at [phone] if you have not heard about the biopsies in 3 weeks.    SIGNATURES/CONFIDENTIALITY: You and/or your care partner have signed paperwork which  will be entered into your electronic medical record.  These signatures attest to the fact that that the information above on your After Visit Summary has been reviewed and is understood.  Full responsibility of the confidentiality of this discharge information lies with you and/or your care-partner.

## 2021-02-04 NOTE — Op Note (Addendum)
[place] Patient Name: Mary Chambers Procedure Date: 02/04/2021 2:57 PM MRN: [id number] Endoscopist: Remo Lipps P. Havery Moros , MD Age: 50 Referring MD:  Date of Birth: [birth year] Gender: Female Account #: [id number] Procedure:                Upper GI endoscopy Indications:              Follow-up of gastro-esophageal reflux disease,                            Follow-up of gastric ulcer - on protonix 40mg  / day                            with some breakthrough of heartburn at times Medicines:                Monitored Anesthesia Care Procedure:                Pre-Anesthesia Assessment:                           - Prior to the procedure, a History and Physical                            was performed, and patient medications and                            allergies were reviewed. The patient's tolerance of                            previous anesthesia was also reviewed. The risks                            and benefits of the procedure and the sedation                            options and risks were discussed with the patient.                            All questions were answered, and informed consent                            was obtained. Prior Anticoagulants: The patient has                            taken no previous anticoagulant or antiplatelet                            agents. ASA Grade Assessment: III - A patient with                            severe systemic disease. After reviewing the risks                            and benefits, the patient was deemed in  satisfactory condition to undergo the procedure.                           After obtaining informed consent, the endoscope was                            passed under direct vision. Throughout the                            procedure, the patient's blood pressure, pulse, and                            oxygen saturations were monitored continuously. The                             Endoscope was introduced through the mouth, and                            advanced to the second part of duodenum. The upper                            GI endoscopy was accomplished without difficulty.                            The patient tolerated the procedure well. Scope In: Scope Out: Findings:                 Esophagogastric landmarks were identified: the                            Z-line was found at 40 cm, the gastroesophageal                            junction was found at 40 cm and the upper extent of                            the gastric folds was found at 40 cm from the                            incisors.                           The exam of the esophagus was otherwise normal.                           Two areas of scarring was found in the gastric                            antrum, likely from prior ulcers. One had some                            mildly nodular mucosa overlying it. Biopsies were  taken with a cold forceps for histology.                           Patchy mildly erythematous mucosa was found in the                            gastric antrum.                           The exam of the stomach was otherwise normal.                           Biopsies were taken with a cold forceps in the                            gastric body, at the incisura and in the gastric                            antrum for Helicobacter pylori testing.                           The duodenal bulb and second portion of the                            duodenum were normal. Complications:            No immediate complications. Estimated blood loss:                            Minimal. Estimated Blood Loss:     Estimated blood loss was minimal. Impression:               - Esophagogastric landmarks identified.                           - Normal esophagus otherwise - no Barrett's or                            esophagitis.                           - Two scars  in the gastric antrum likely from                            history of PUD, one with some mildly overlying                            nodular mucosa Biopsied.                           - Erythematous mucosa in the antrum.                           - Normal stomach otherwise - biopsies taken to rule  out H pylori                           - Normal duodenal bulb and second portion of the                            duodenum. Recommendation:           - Patient has a contact number available for                            emergencies. The signs and symptoms of potential                            delayed complications were discussed with the                            patient. Return to normal activities tomorrow.                            Written discharge instructions were provided to the                            patient.                           - Resume previous diet.                           - Continue present medications.                           - Await pathology results.                           - Titrate up or down protonix as needed to control                            heartburn, will discuss with patient Carlota Raspberry. Meggie Laseter, MD 02/04/2021 3:40:24 PM This report has been signed electronically.

## 2021-02-04 NOTE — Progress Notes (Signed)
[facility]  History reviewed.

## 2021-02-04 NOTE — Op Note (Signed)
[place] Patient Name: Mary Chambers Procedure Date: 02/04/2021 2:57 PM MRN: [id number] Endoscopist: Remo Lipps P. Havery Moros , MD Age: 50 Referring MD:  Date of Birth: 11-28-1970 Gender: Female Account #: [id number] Procedure:                Colonoscopy Indications:              Screening for colorectal malignant neoplasm Medicines:                Monitored Anesthesia Care Procedure:                Pre-Anesthesia Assessment:                           - Prior to the procedure, a History and Physical                            was performed, and patient medications and                            allergies were reviewed. The patient's tolerance of                            previous anesthesia was also reviewed. The risks                            and benefits of the procedure and the sedation                            options and risks were discussed with the patient.                            All questions were answered, and informed consent                            was obtained. Prior Anticoagulants: The patient has                            taken no previous anticoagulant or antiplatelet                            agents. ASA Grade Assessment: III - A patient with                            severe systemic disease. After reviewing the risks                            and benefits, the patient was deemed in                            satisfactory condition to undergo the procedure.                           After obtaining informed consent, the colonoscope  was passed under direct vision. Throughout the                            procedure, the patient's blood pressure, pulse, and                            oxygen saturations were monitored continuously. The                            Olympus CF-HQ190 [id number]) Colonoscope was                            introduced through the anus and advanced to the the                             cecum, identified by appendiceal orifice and                            ileocecal valve. The colonoscopy was performed                            without difficulty. The patient tolerated the                            procedure well. The quality of the bowel                            preparation was good. The ileocecal valve,                            appendiceal orifice, and rectum were photographed. Scope In: 3:09:29 PM Scope Out: 3:27:06 PM Scope Withdrawal Time: 0 hours 14 minutes 14 seconds  Total Procedure Duration: 0 hours 17 minutes 37 seconds  Findings:                 The perianal and digital rectal examinations were                            normal.                           A 3 mm polyp was found in the cecum. The polyp was                            sessile. The polyp was removed with a cold snare.                            Resection and retrieval were complete.                           A 3 mm polyp was found in the ascending colon. The                            polyp was sessile. The polyp was removed with  a                            cold snare. Resection and retrieval were complete.                           A few small-mouthed diverticula were found in the                            sigmoid colon.                           Internal hemorrhoids were found during retroflexion.                           The exam was otherwise without abnormality. Complications:            No immediate complications. Estimated blood loss:                            Minimal. Estimated Blood Loss:     Estimated blood loss was minimal. Impression:               - One 3 mm polyp in the cecum, removed with a cold                            snare. Resected and retrieved.                           - One 3 mm polyp in the ascending colon, removed                            with a cold snare. Resected and retrieved.                           - Diverticulosis in the sigmoid colon.                            - Internal hemorrhoids.                           - The examination was otherwise normal. Recommendation:           - Patient has a contact number available for                            emergencies. The signs and symptoms of potential                            delayed complications were discussed with the                            patient. Return to normal activities tomorrow.                            Written discharge instructions were provided to the  patient.                           - Resume previous diet.                           - Continue present medications.                           - Await pathology results. Remo Lipps P. Elza Sortor, MD 02/04/2021 3:33:15 PM This report has been signed electronically.

## 2021-02-08 ENCOUNTER — Telehealth: Payer: Self-pay | Admitting: *Deleted

## 2021-02-08 NOTE — Telephone Encounter (Signed)
  Follow up Call-  Call back number 02/04/2021  Post procedure Call Back phone  # [phone]  Permission to leave phone message Yes  Some recent data might be hidden     Patient questions:  Do you have a fever, pain , or abdominal swelling? No. Pain Score  0 *  Have you tolerated food without any problems? Yes.    Have you been able to return to your normal activities? Yes.    Do you have any questions about your discharge instructions: Diet   No. Medications  No. Follow up visit  No.  Do you have questions or concerns about your Care? No.  Actions: * If pain score is 4 or above: No action needed, pain <4

## 2021-03-06 ENCOUNTER — Telehealth: Payer: Medicaid Other | Admitting: Family

## 2021-03-06 DIAGNOSIS — N898 Other specified noninflammatory disorders of vagina: Secondary | ICD-10-CM

## 2021-03-06 NOTE — Progress Notes (Signed)
Based on what you shared with me, I feel your condition warrants further evaluation and I recommend that you be seen in a face to face office visit.  I am sorry, but recommend going to an Urgent Care. They are usually opened until 8pm.    NOTE: If you entered your credit card information for this eVisit, you will not be charged. You may see a "hold" on your card for the $35 but that hold will drop off and you will not have a charge processed.   If you are having a true medical emergency please call 911.      For an urgent face to face visit, [place] has six urgent care centers for your convenience:     [facility] [place] [facility] [place] Get Driving Directions [phone] [place], [place] [address] . 8 am - 4 pm Monday - Friday    [facility] Get Driving Directions [phone] [address] [place], [place] [address] . 8 am to 8 pm Monday-Friday . 10 am to 6 pm [facility]gent [facility] Get Driving Directions [phone]  [address] [place],  [place]  [address] . 8 am to 8 pm Monday-Friday . 8 am to 4 pm [facility] at [facility] Get Driving Directions [phone] [place], [place], [place] [address] . 8 am to 8 pm Monday-Friday . 8 am to 4 pm [facility] at [facility] Get Driving Directions  [phone] [address] [place], [place] [address] . 8 am to 8 pm Monday-Friday . 8 am to 4 pm [facility] at [place] Get Driving Directions [phone] [address], [place],  [place] [address] . 8 am to 8 pm Monday-Friday . 8 am to 4 pm Saturday-Sunday     Your MyChart E-visit questionnaire answers were reviewed by a board certified advanced clinical practitioner to complete your  personal care plan based on your specific symptoms.  Thank you for using e-Visits.

## 2021-03-06 NOTE — Progress Notes (Signed)
Based on what you shared with me, I feel your condition warrants further evaluation and I recommend that you be seen in a face to face office visit.   Given your vaginal discharge you need to be seen face to face to rule out a more serious infection.   NOTE: If you entered your credit card information for this eVisit, you will not be charged. You may see a "hold" on your card for the $35 but that hold will drop off and you will not have a charge processed.   If you are having a true medical emergency please call 911.      For an urgent face to face visit, [facility] [place] has six urgent care centers for your convenience:     [facility] [place] [facility] [place] Get Driving Directions [phone] [place], [place] [address] . 8 am - 4 pm Monday - Friday    [place] [facility]) Get Driving Directions [phone] [address] [place], [place] [address] . 8 am to 8 pm Monday-Friday . 10 am to 6 pm [facility] Urgent [facility] Get Driving Directions [phone]  [address] [place],  [place]  [address] . 8 am to 8 pm Monday-Friday . 8 am to 4 pm [facility] at [facility] Get Driving Directions [phone] [place], [place], [place] [address] . 8 am to 8 pm Monday-Friday . 8 am to 4 pm [facility] at [facility] Get Driving Directions  [phone] [address] [place], [place] [address] . 8 am to 8 pm Monday-Friday . 8 am to 4 pm [facility] at [place] Get Driving Directions [phone] [address], [place], [place] [address] . 8 am to 8 pm Monday-Friday . 8 am to 4 pm Saturday-Sunday     Your MyChart E-visit questionnaire answers were reviewed by a board certified advanced clinical practitioner to  complete your personal care plan based on your specific symptoms.  Thank you for using e-Visits.

## 2021-03-11 ENCOUNTER — Ambulatory Visit (INDEPENDENT_AMBULATORY_CARE_PROVIDER_SITE_OTHER): Payer: 59

## 2021-03-11 ENCOUNTER — Ambulatory Visit (HOSPITAL_COMMUNITY)
Admission: EM | Admit: 2021-03-11 | Discharge: 2021-03-11 | Disposition: A | Discharge status: Home / Self Care | Payer: 59 | Attending: Emergency Medicine | Admitting: Emergency Medicine

## 2021-03-11 ENCOUNTER — Other Ambulatory Visit: Payer: Self-pay

## 2021-03-11 ENCOUNTER — Encounter (HOSPITAL_COMMUNITY): Payer: Self-pay

## 2021-03-11 DIAGNOSIS — R102 Pelvic and perineal pain: Secondary | ICD-10-CM

## 2021-03-11 DIAGNOSIS — S3219XA Other fracture of sacrum, initial encounter for closed fracture: Secondary | ICD-10-CM | POA: Diagnosis not present

## 2021-03-11 DIAGNOSIS — S39012A Strain of muscle, fascia and tendon of lower back, initial encounter: Secondary | ICD-10-CM

## 2021-03-11 IMAGING — DX DG SACRUM/COCCYX 2+V
3 series · 3 of 3 positions shown · non-contrast
Comparison: [DATE]

CLINICAL DATA: Fall, sacrococcygeal pain

EXAM:
SACRUM AND COCCYX - 2+ VIEW

[coccyx ap]
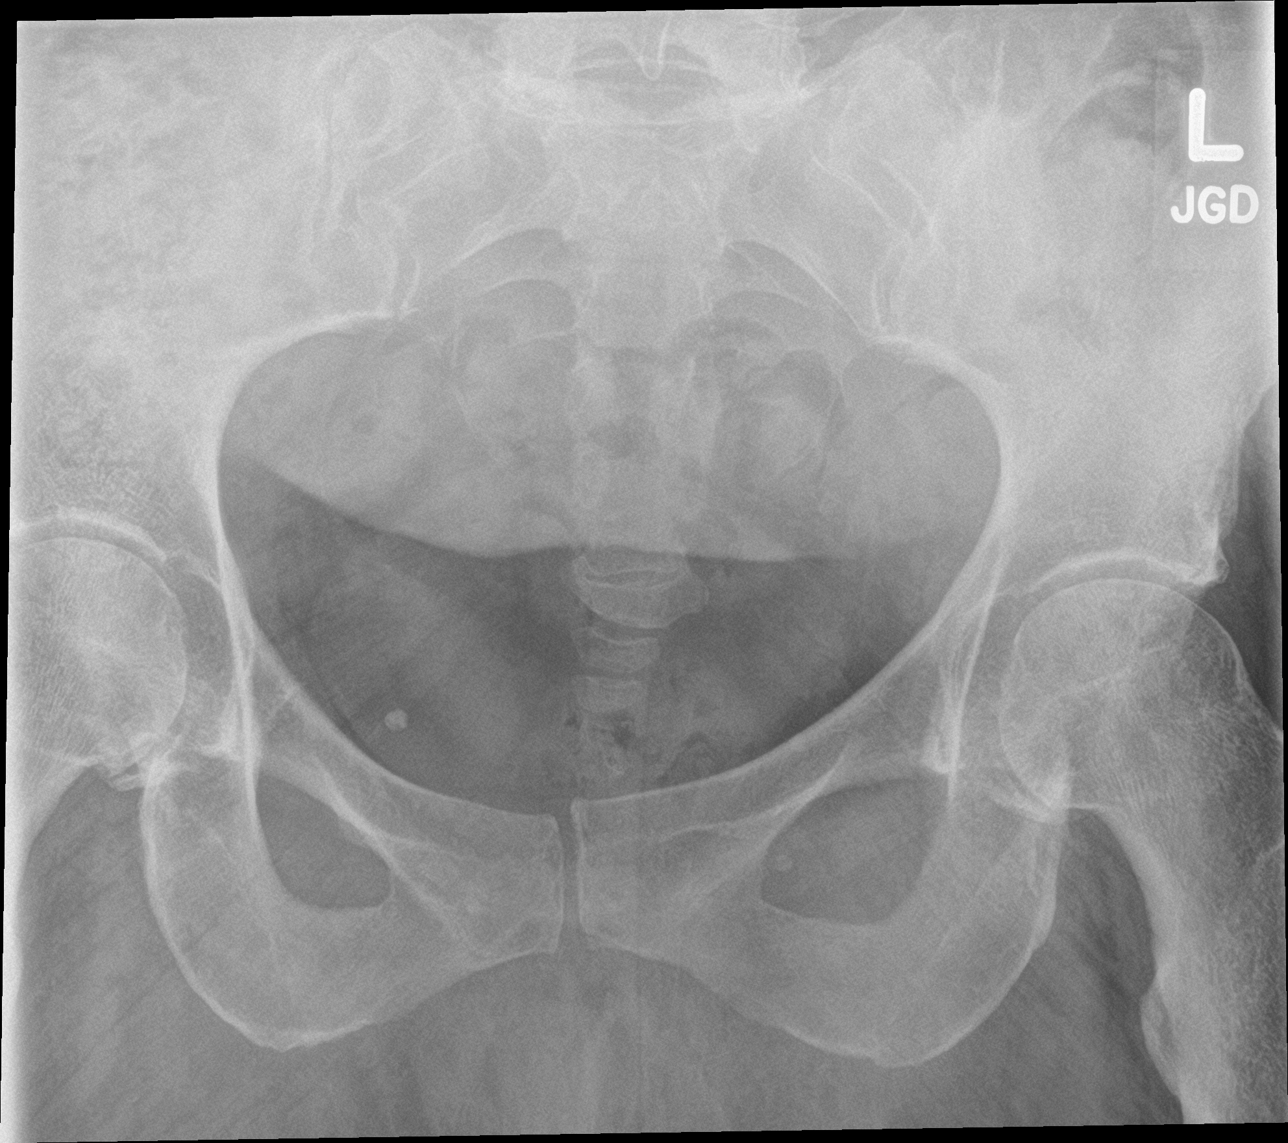

[sacrum ap]
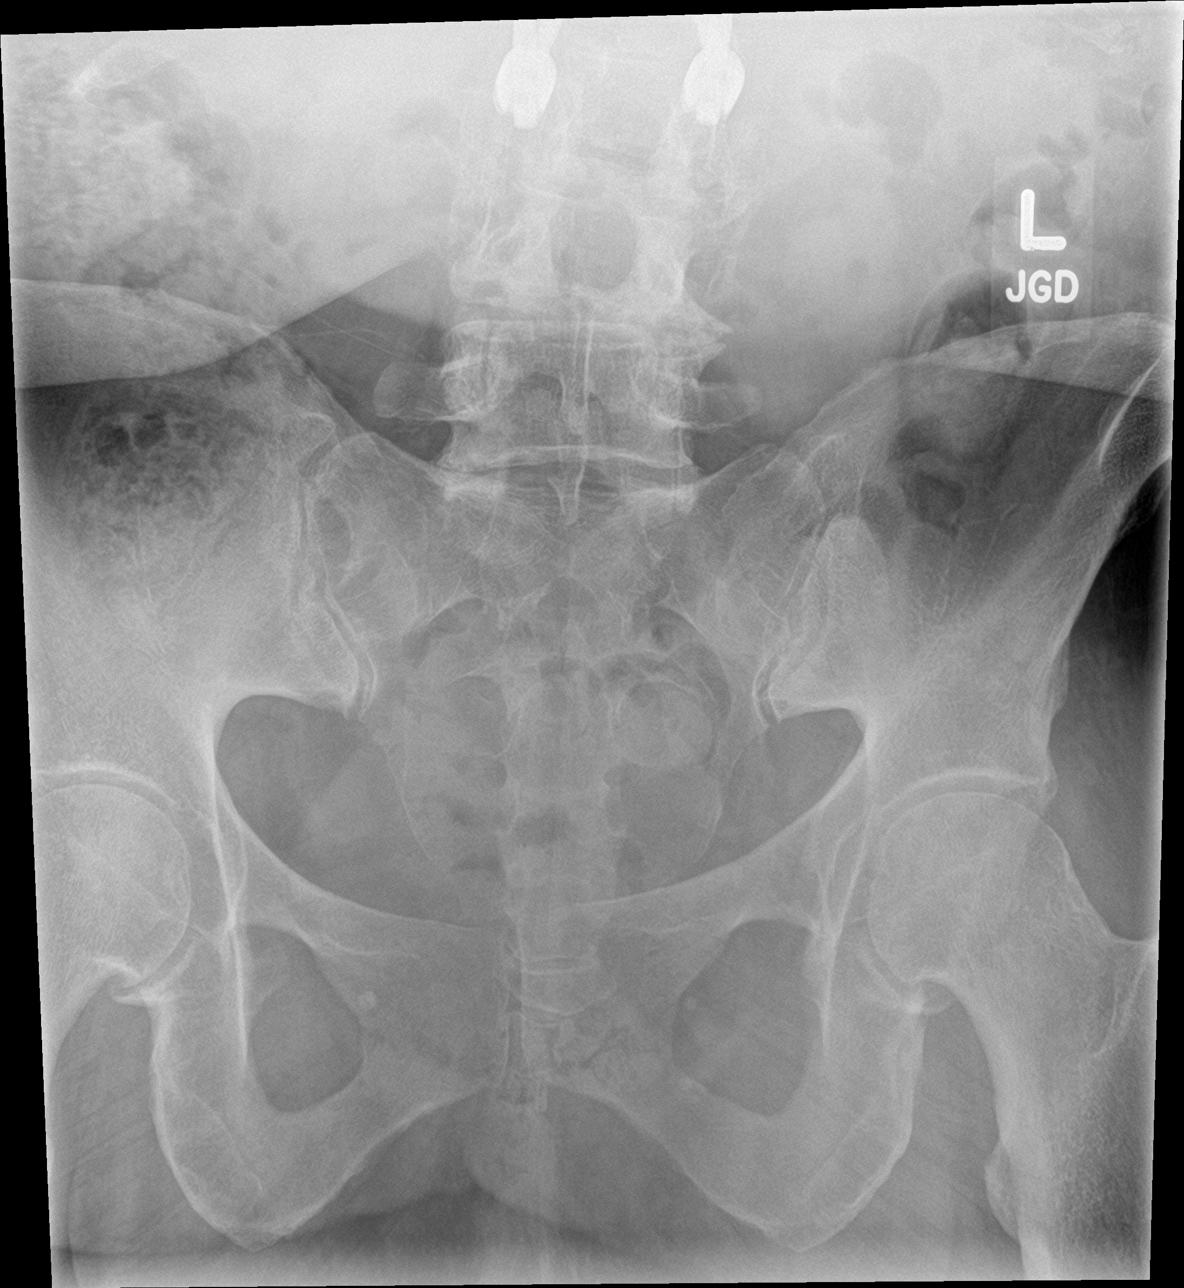

[sacrum lat]
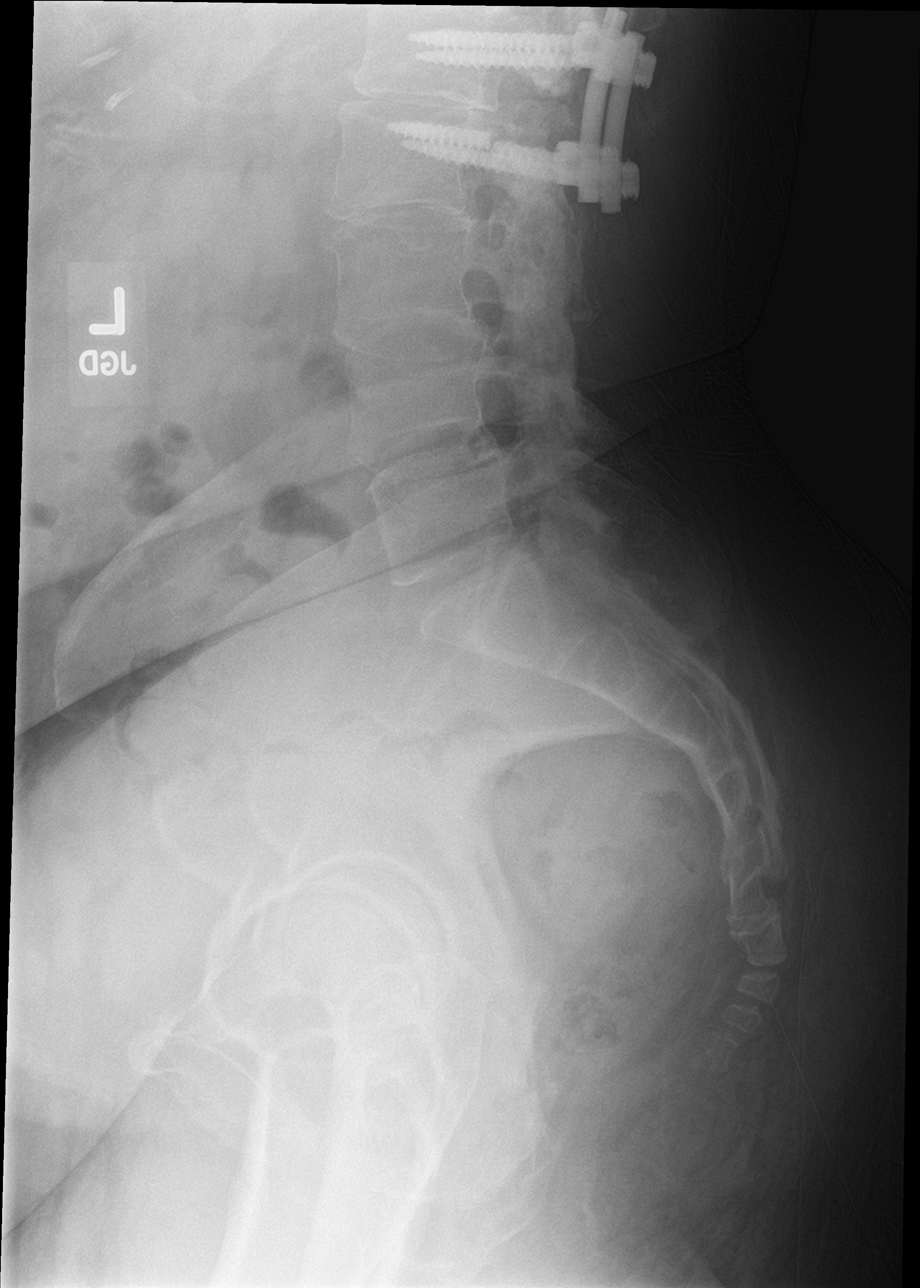

[3 of 3 positions shown; findings below may reference images not displayed]

FINDINGS: New contour deformity within the distal sacrum at S4-S5 suspicious
for a nondisplaced fracture. Bilateral SI joints are intact without
diastasis. One of the L2 transpedicular screws is fractured at its
midpoint.
IMPRESSION: 1. Findings suspicious for a nondisplaced fracture of the distal
sacrum.
2. One of the L2 transpedicular screws is fractured, new from prior.

## 2021-03-11 NOTE — ED Triage Notes (Signed)
Pt reports back pain and bilateral buttocks pain after she fell on concrete floor at work when she was walking and her right ankle gave up. Pt took Tramadol 1 hr ago.   Pt is concern as she has several back surgeries. Reports 1 of the screw in the back is crack, she was told 3 weeks ago.

## 2021-03-11 NOTE — Discharge Instructions (Addendum)
Follow up your neurosurgeon as soon as possible for re-evaluation of your transpedicular screw and sacral fracture.   You can sit on a donut for comfort.    You can take Ibuprofen and/or Tylenol as needed for pain relief and fever reduction.  You can also take your prescriptions medications as needed or as prescribed.    Return or go to the Emergency Department if symptoms worsen or do not improve in the next few days.

## 2021-03-11 NOTE — ED Provider Notes (Signed)
[place]    CSN: [id number] Arrival date & time: 03/11/21  1157      History   Chief Complaint Chief Complaint  Patient presents with   Back Pain    HPI Mary Chambers is a 50 y.o. female.   Patient here for evaluation of bilateral Leg and back pain after work.  Patient reports having history of back surgery reports and was told that one of the screws was "cracked" at her last visit.  Reports pain is worse with movement and improves when sitting or being still.  Reports taking a Tramadol with minimal relief.  Denies any fevers, chest pain, shortness of breath, N/V/D, numbness, tingling, weakness, abdominal pain, or headaches.    The history is provided by the patient.  Back Pain  Past Medical History:  Diagnosis Date   Arthritis    Depression    Hypertension    Kidney stone    Obesity     Patient Active Problem List   Diagnosis Date Noted   Gastroesophageal reflux disease 12/15/2020   Screening for colorectal cancer 12/15/2020   Nausea without vomiting 12/15/2020   History of gastric ulcer 12/15/2020   Herniated lumbar disc without myelopathy 10/02/2019   Lumbar spinal stenosis 10/01/2019   Pain in left foot 03/29/2018   Arthritis 02/13/2018   Chronic back pain 02/13/2018   Migraines 02/13/2018   Morbid obesity with BMI of 40.0-44.9, adult ([place]) 02/13/2018   Synovial cyst of lumbar spine 10/15/2017   Osteoarthritis of knee 07/13/2017   Lumbar disc disease with radiculopathy 08/01/2016   Fatigue 05/04/2016   Headache 05/04/2016   Numbness of foot 05/04/2016   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   Arthrodesis status 02/02/2016   Chronic pain syndrome 02/02/2016   Elevated white blood cell count 12/01/2015   Lumbar stenosis with neurogenic claudication 05/07/2015   Bilateral leg edema 04/21/2015   Carpal tunnel syndrome of right wrist 12/14/2014   Renal atrophy, left 01/23/2014   Achilles tendonitis 01/16/2014   Right lower quadrant pain  12/18/2013   Hypertension, benign 06/03/2010   Urinary incontinence 06/03/2010   Cervical disc disease 09/09/2009   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      Home Medications    Prior to Admission medications   Medication Sig Start Date End Date Taking? Authorizing Provider  Ascorbic Acid (VITAMIN C) 500 MG CAPS Take 500 mg by mouth daily.    [provider]  cephALEXin (KEFLEX) 500 MG capsule Take 1 capsule (500 mg total) by mouth 2 (two) times daily. Patient not taking: No sig reported 01/26/21   Evelina Dun A, FNP  cholecalciferol (VITAMIN D3) 25 MCG (1000 UT) tablet Take 1,000 Units by mouth daily.    [provider]  enalapril (VASOTEC) 10 MG tablet Take 10 mg by mouth daily.    [provider]  hydrochlorothiazide (HYDRODIURIL) 25 MG tablet Take 25 mg by mouth daily.    [provider]  Multiple Vitamin (MULTIVITAMIN) capsule Take 1 capsule by mouth daily.     [provider]  ondansetron (ZOFRAN ODT) 4 MG disintegrating tablet Take 1 tablet (4 mg total)  by mouth every 8 (eight) hours as needed for nausea or vomiting. 09/20/20   Wieters, Hallie C, PA-C  pantoprazole (PROTONIX) 20 MG tablet Take 1 tablet (20 mg total) by mouth daily. 04/22/20   Quintella Reichert, MD  POTASSIUM PO Take 1,000 mg by mouth daily.    [provider]  tiZANidine (ZANAFLEX) 2 MG tablet Take 2-4 mg by mouth 2 (two) times daily as needed for muscle spasms (sleep).  04/11/20   [provider]  traMADol (ULTRAM) 50 MG tablet Take 50 mg by mouth 3 (three) times daily as needed. 02/02/21   [provider]  diphenhydrAMINE (BENADRYL) 25 mg capsule Take by mouth. 03/10/16 06/21/19  [provider]   LYRICA 50 MG capsule Take 50 mg by mouth 3 (three) times daily. 04/15/18 06/07/19  [provider]    Family History Family History  Problem Relation Age of Onset   Diabetes Mother    Hypertension Mother    Cancer Father    Parkinson's disease Father    Heart disease Father    Heart disease Sister    Heart disease Brother    Colon cancer Neg Hx    Rectal cancer Neg Hx    Stomach cancer Neg Hx     Social History Social History   Tobacco Use   Smoking status: Never   Smokeless tobacco: Never  Vaping Use   Vaping Use: Never used  Substance Use Topics   Alcohol use: No   Drug use: Never     Allergies   Baclofen, Doxycycline, Percocet [oxycodone-acetaminophen], Septra [sulfamethoxazole-trimethoprim], and Gabapentin   Review of Systems Review of Systems  Musculoskeletal:  Positive for back pain.  All other systems reviewed and are negative.   Physical Exam Triage Vital Signs ED Triage Vitals  Enc Vitals Group     BP 03/11/21 1218 (!) 132/97     Pulse Rate 03/11/21 1218 97     Resp 03/11/21 1218 20     Temp 03/11/21 1218 97.8 F (36.6 C)     Temp Source 03/11/21 1218 Oral     SpO2 03/11/21 1218 97 %     Weight --      Height --      Head Circumference --      Peak Flow --      Pain Score 03/11/21 1212 9     Pain Loc --      Pain Edu? --      Excl. in [place]? --    No data found.  Updated Vital Signs BP (!) 132/97 (BP Location: Right Wrist)   Pulse 97   Temp 97.8 F (36.6 C) (Oral)   Resp 20   SpO2 97%   Visual Acuity Right Eye Distance:   Left Eye Distance:   Bilateral Distance:    Right Eye Near:   Left Eye Near:    Bilateral Near:     Physical Exam Vitals and nursing note reviewed.  Constitutional:      General: She is not in acute distress.    Appearance: Normal appearance. She is not ill-appearing, toxic-appearing or diaphoretic.  HENT:     Head: Normocephalic and atraumatic.  Eyes:     Conjunctiva/sclera: Conjunctivae normal.   Cardiovascular:     Rate and Rhythm: Normal rate.     Pulses: Normal pulses.  Pulmonary:     Effort: Pulmonary effort is normal.  Abdominal:     General: Abdomen is flat.  Musculoskeletal:  Cervical back: Normal and normal range of motion.     Thoracic back: Normal.     Lumbar back: Tenderness and bony tenderness present. Decreased range of motion. Negative right straight leg raise test and negative left straight leg raise test.  Skin:    General: Skin is warm and dry.  Neurological:     General: No focal deficit present.     Mental Status: She is alert and oriented to person, place, and time.  Psychiatric:        Mood and Affect: Mood normal.     UC Treatments / Results  Labs (all labs ordered are listed, but only abnormal results are displayed) Labs Reviewed - No data to display  EKG   Radiology DG Sacrum/Coccyx  Result Date: 03/11/2021 CLINICAL DATA:  Fall, sacrococcygeal pain EXAM: SACRUM AND COCCYX - 2+ VIEW COMPARISON:  07/25/2019 FINDINGS: New contour deformity within the distal sacrum at S4-S5 suspicious for a nondisplaced fracture. Bilateral SI joints are intact without diastasis. One of the L2 transpedicular screws is fractured at its midpoint. IMPRESSION: 1. Findings suspicious for a nondisplaced fracture of the distal sacrum. 2. One of the L2 transpedicular screws is fractured, new from prior. Electronically Signed   By: Davina Poke D.O.   On: 03/11/2021 13:06    Procedures Procedures (including critical care time)  Medications Ordered in UC Medications - No data to display  Initial Impression / Assessment and Plan / UC Course  I have reviewed the triage vital signs and the nursing notes.  Pertinent labs & imaging results that were available during my care of the patient were reviewed by me and considered in my medical decision making (see chart for details).    Xray shows nondisplaced fracture of the distal sacrum and one of the L2 transpedicular  screws is fractured.  Patient reports that one of the screws was fractured at her last visit with neurosurgery a few weeks ago.  Recommend sitting on a donut for comfort.  May take Tylenol and/or Ibuprofen for pain and fever.  Patient is already prescribed Ultram for pain.  Recommend following up with neurosurgery for re-evaluation of broken screw and distal sacrum fracture.   Final Clinical Impressions(s) / UC Diagnoses   Final diagnoses:  Other fracture of sacrum, initial encounter for closed fracture ([place])  Strain of lumbar region, initial encounter     Discharge Instructions      Follow up your neurosurgeon as soon as possible for re-evaluation of your transpedicular screw and sacral fracture.   You can sit on a donut for comfort.    You can take Ibuprofen and/or Tylenol as needed for pain relief and fever reduction.  You can also take your prescriptions medications as needed or as prescribed.    Return or go to the Emergency Department if symptoms worsen or do not improve in the next few days.       ED Prescriptions   None    PDMP not reviewed this encounter.   Pearson Forster, NP 03/11/21 1334

## 2021-03-13 ENCOUNTER — Telehealth: Payer: 59 | Admitting: Physician Assistant

## 2021-03-13 DIAGNOSIS — S3210XD Unspecified fracture of sacrum, subsequent encounter for fracture with routine healing: Secondary | ICD-10-CM | POA: Diagnosis not present

## 2021-03-13 MED ORDER — CYCLOBENZAPRINE HCL 10 MG PO TABS: 10.0000 mg | ORAL_TABLET | Freq: Three times a day (TID) | ORAL | 0 refills | Status: DC | PRN

## 2021-03-13 MED ORDER — NAPROXEN 500 MG PO TABS: 500.0000 mg | ORAL_TABLET | Freq: Two times a day (BID) | ORAL | 0 refills | Status: DC

## 2021-03-13 NOTE — Progress Notes (Signed)
We are sorry that you are not feeling well.  Here is how we plan to help!  Based on what you have shared with me it looks like you mostly have acute back pain.  Acute back pain is defined as musculoskeletal pain that can resolve in 1-3 weeks with conservative treatment.  I have prescribed Naprosyn 500 mg take one by mouth twice a day non-steroid anti-inflammatory (NSAID) as well as Flexeril 10 mg every eight hours as needed which is a muscle relaxer  Some patients experience stomach irritation or in increased heartburn with anti-inflammatory drugs.  Please keep in mind that muscle relaxer's can cause fatigue and should not be taken while at work or driving.  Back pain is very common.  The pain often gets better over time.  The cause of back pain is usually not dangerous.  Most people can learn to manage their back pain on their own.  Unfortunately, through this platform we are unable to provide controlled substances, so I am unable to prescribe a stronger pain medication than tramadol, which you are already on. A work note will be provided. I would recommend to follow up with your neurosurgeon for further care.   Home Care Stay active.  Start with short walks on flat ground if you can.  Try to walk farther each day. Do not sit, drive or stand in one place for more than 30 minutes.  Do not stay in bed. Do not avoid exercise or work.  Activity can help your back heal faster. Be careful when you bend or lift an object.  Bend at your knees, keep the object close to you, and do not twist. Sleep on a firm mattress.  Lie on your side, and bend your knees.  If you lie on your back, put a pillow under your knees. Only take medicines as told by your doctor. Put ice on the injured area. Put ice in a plastic bag Place a towel between your skin and the bag Leave the ice on for 15-20 minutes, 3-4 times a day for the first 2-3 days. 210 After that, you can switch between ice and heat packs. Ask your doctor  about back exercises or massage. Avoid feeling anxious or stressed.  Find good ways to deal with stress, such as exercise.  Get Help Right Way If: Your pain does not go away with rest or medicine. Your pain does not go away in 1 week. You have new problems. You do not feel well. The pain spreads into your legs. You cannot control when you poop (bowel movement) or pee (urinate) You feel sick to your stomach (nauseous) or throw up (vomit) You have belly (abdominal) pain. You feel like you may pass out (faint). If you develop a fever.  Make Sure you: Understand these instructions. Will watch your condition Will get help right away if you are not doing well or get worse.  Your e-visit answers were reviewed by a board certified advanced clinical practitioner to complete your personal care plan.  Depending on the condition, your plan could have included both over the counter or prescription medications.  If there is a problem please reply  once you have received a response from your provider.  Your safety is important to [place].  If you have drug allergies check your prescription carefully.    You can use MyChart to ask questions about today's visit, request a non-urgent call back, or ask for a work or school excuse for 24 hours related  to this e-Visit. If it has been greater than 24 hours you will need to follow up with your provider, or enter a new e-Visit to address those concerns.  You will get an e-mail in the next two days asking about your experience.  I hope that your e-visit has been valuable and will speed your recovery. Thank you for using e-visits.  I provided 6 minutes of non face-to-face time during this encounter for chart review and documentation.

## 2021-03-24 ENCOUNTER — Other Ambulatory Visit: Payer: Self-pay | Admitting: Physician Assistant

## 2021-03-24 DIAGNOSIS — S3210XD Unspecified fracture of sacrum, subsequent encounter for fracture with routine healing: Secondary | ICD-10-CM

## 2021-03-30 ENCOUNTER — Telehealth: Payer: 59 | Admitting: Physician Assistant

## 2021-03-30 ENCOUNTER — Other Ambulatory Visit: Payer: Self-pay | Admitting: Physician Assistant

## 2021-03-30 DIAGNOSIS — S3210XD Unspecified fracture of sacrum, subsequent encounter for fracture with routine healing: Secondary | ICD-10-CM

## 2021-03-30 MED ORDER — CYCLOBENZAPRINE HCL 10 MG PO TABS: 10.0000 mg | ORAL_TABLET | Freq: Three times a day (TID) | ORAL | 0 refills | Status: DC | PRN

## 2021-03-30 MED ORDER — NAPROXEN 500 MG PO TABS: 500.0000 mg | ORAL_TABLET | Freq: Two times a day (BID) | ORAL | 0 refills | Status: DC

## 2021-03-30 NOTE — Progress Notes (Signed)
I have spent 5 minutes in review of e-visit questionnaire, review and updating patient chart, medical decision making and response to patient.   Starlin Steib Cody Kayle Passarelli, PA-C    

## 2021-03-30 NOTE — Progress Notes (Signed)

## 2021-04-05 ENCOUNTER — Telehealth: Payer: 59 | Admitting: Physician Assistant

## 2021-04-05 DIAGNOSIS — R399 Unspecified symptoms and signs involving the genitourinary system: Secondary | ICD-10-CM

## 2021-04-05 DIAGNOSIS — B379 Candidiasis, unspecified: Secondary | ICD-10-CM

## 2021-04-05 DIAGNOSIS — T3695XA Adverse effect of unspecified systemic antibiotic, initial encounter: Secondary | ICD-10-CM

## 2021-04-05 MED ORDER — CEPHALEXIN 500 MG PO CAPS: 500.0000 mg | ORAL_CAPSULE | Freq: Two times a day (BID) | ORAL | 0 refills | Status: AC

## 2021-04-05 MED ORDER — FLUCONAZOLE 150 MG PO TABS: 150.0000 mg | ORAL_TABLET | Freq: Once | ORAL | 0 refills | Status: AC

## 2021-04-05 NOTE — Addendum Note (Signed)
Addended by: Mar Daring on: 04/05/2021 12:32 PM   Modules accepted: Orders

## 2021-04-05 NOTE — Progress Notes (Signed)

## 2021-04-05 NOTE — Progress Notes (Signed)
I have spent 5 minutes in review of e-visit questionnaire, review and updating patient chart, medical decision making and response to patient.   Paysen Goza Cody Chanley Mcenery, PA-C    

## 2021-05-03 ENCOUNTER — Telehealth: Payer: 59 | Admitting: Physician Assistant

## 2021-05-03 DIAGNOSIS — B9789 Other viral agents as the cause of diseases classified elsewhere: Secondary | ICD-10-CM

## 2021-05-03 DIAGNOSIS — J019 Acute sinusitis, unspecified: Secondary | ICD-10-CM

## 2021-05-03 DIAGNOSIS — Z20822 Contact with and (suspected) exposure to covid-19: Secondary | ICD-10-CM

## 2021-05-03 MED ORDER — FLUTICASONE PROPIONATE 50 MCG/ACT NA SUSP: 2.0000 | Freq: Every day | NASAL | 0 refills | Status: DC

## 2021-05-03 MED ORDER — BENZONATATE 100 MG PO CAPS: 100.0000 mg | ORAL_CAPSULE | Freq: Three times a day (TID) | ORAL | 0 refills | Status: DC | PRN

## 2021-05-03 NOTE — Progress Notes (Signed)

## 2021-05-03 NOTE — Progress Notes (Signed)
I have spent 5 minutes in review of e-visit questionnaire, review and updating patient chart, medical decision making and response to patient.   Saban Heinlen Cody Sung Parodi, PA-C    

## 2021-05-03 NOTE — Progress Notes (Signed)
E-Visit for Corona Virus Screening  Your current symptoms could be consistent with the coronavirus.  Many health care providers can now test patients at their office but not all are.  [facility] has multiple testing sites. For information on our COVID testing locations and hours go to [email or web address]  We are enrolling you in our MyChart Home Monitoring for COVID19 . Daily you will receive a questionnaire within the MyChart website. Our COVID 19 response team will be monitoring your responses daily.  Testing Information: The COVID-19 Community Testing sites are testing BY APPOINTMENT ONLY.  You can schedule online at [email or web address]  If you do not have access to a smart phone or computer you may call [phone] for an appointment.   Additional testing sites in the Community:  For [facility] Testing sites in [place]  https://www.[facility]vs.com/minuteclinic/covid-19-testing  For Pop-up testing sites in [place]  https://covid19.ncdhhs.gov/about-covid-19/testing/find-my-testing-place/pop-testing-sites  For [facility] https://[place].gov[place]/human-services/health-department/coronavirus-covid-19-info/covid-19-testing  For [place] testing in [place] and [place] https://www.[place].gov[place]/human-services/health-department/coronavirus-covid-19-info/covid-19-testing  For [facility] testing in [place]   https://[facility].care/[email or web address]  For  more information about community testing call [phone]   Please quarantine yourself while awaiting your test results. Please stay home for a minimum of 10 days from the first day of illness with improving symptoms and you have had 24 hours of no fever (without the use of Tylenol (Acetaminophen) Motrin (Ibuprofen) or any fever reducing medication).  Also - Do not get tested prior to returning to work because once you have had a positive test the test can stay positive for  more than a month in some cases.   You should wear a mask or cloth face covering over your nose and mouth if you must be around other people or animals, including pets (even at home). Try to stay at least 6 feet away from other people. This will protect the people around you.  Please continue good preventive care measures, including:  frequent hand-washing, avoid touching your face, cover coughs/sneezes, stay out of crowds and keep a 6 foot distance from others.  COVID-19 is a respiratory illness with symptoms that are similar to the flu. Symptoms are typically mild to moderate, but there have been cases of severe illness and death due to the virus.   The following symptoms may appear 2-14 days after exposure: Fever Cough Shortness of breath or difficulty breathing Chills Repeated shaking with chills Muscle pain Headache Sore throat New loss of taste or smell Fatigue Congestion or runny nose Nausea or vomiting Diarrhea  Go to the nearest hospital ED for assessment if fever/cough/breathlessness are severe or illness seems like a threat to life.  It is vitally important that if you feel that you have an infection such as this virus or any other virus that you stay home and away from places where you may spread it to others.  You should avoid contact with people age 65 and older.   You can use medication such as prescription cough medication called Tessalon Perles 100 mg. You may take 1-2 capsules every 8 hours as needed for cough and prescription for Fluticasone nasal spray 2 sprays in each nostril one time per day  You may also take acetaminophen (Tylenol) as needed for fever.  Reduce your risk of any infection by using the same precautions used for avoiding the common cold or flu:  Wash your hands often with soap and warm water for at least 20 seconds.  If soap and water are not readily available, use an alcohol-based   hand sanitizer with at least 60% alcohol.  If coughing or sneezing,  cover your mouth and nose by coughing or sneezing into the elbow areas of your shirt or coat, into a tissue or into your sleeve (not your hands). Avoid shaking hands with others and consider head nods or verbal greetings only. Avoid touching your eyes, nose, or mouth with unwashed hands.  Avoid close contact with people who are sick. Avoid places or events with large numbers of people in one location, like concerts or sporting events. Carefully consider travel plans you have or are making. If you are planning any travel outside or inside the [place], visit the CDC's Travelers' Health webpage for the latest health notices. If you have some symptoms but not all symptoms, continue to monitor at home and seek medical attention if your symptoms worsen. If you are having a medical emergency, call 911.  HOME CARE Only take medications as instructed by your medical team. Drink plenty of fluids and get plenty of rest. A steam or ultrasonic humidifier can help if you have congestion.   GET HELP RIGHT AWAY IF YOU HAVE EMERGENCY WARNING SIGNS** FOR COVID-19. If you or someone is showing any of these signs seek emergency medical care immediately. Call 911 or proceed to your closest emergency facility if: You develop worsening high fever. Trouble breathing Bluish lips or face Persistent pain or pressure in the chest New confusion Inability to wake or stay awake You cough up blood. Your symptoms become more severe  **This list is not all possible symptoms. Contact your medical provider for any symptoms that are sever or concerning to you.  MAKE SURE YOU  Understand these instructions. Will watch your condition. Will get help right away if you are not doing well or get worse.  Your e-visit answers were reviewed by a board certified advanced clinical practitioner to complete your personal care plan.  Depending on the condition, your plan could have included both over the counter or prescription  medications.  If there is a problem please reply once you have received a response from your provider.  Your safety is important to [place].  If you have drug allergies check your prescription carefully.    You can use MyChart to ask questions about today's visit, request a non-urgent call back, or ask for a work or school excuse for 24 hours related to this e-Visit. If it has been greater than 24 hours you will need to follow up with your provider, or enter a new e-Visit to address those concerns. You will get an e-mail in the next two days asking about your experience.  I hope that your e-visit has been valuable and will speed your recovery. Thank you for using e-visits.  I provided 5 minutes of non face-to-face time during this encounter for chart review and documentation.

## 2021-06-02 DIAGNOSIS — I358 Other nonrheumatic aortic valve disorders: Secondary | ICD-10-CM

## 2021-06-02 HISTORY — DX: Other nonrheumatic aortic valve disorders: I35.8

## 2021-06-18 ENCOUNTER — Other Ambulatory Visit: Payer: Self-pay

## 2021-06-18 ENCOUNTER — Encounter (HOSPITAL_BASED_OUTPATIENT_CLINIC_OR_DEPARTMENT_OTHER): Payer: Self-pay

## 2021-06-18 DIAGNOSIS — I10 Essential (primary) hypertension: Secondary | ICD-10-CM | POA: Diagnosis not present

## 2021-06-18 DIAGNOSIS — Z79899 Other long term (current) drug therapy: Secondary | ICD-10-CM | POA: Diagnosis not present

## 2021-06-18 DIAGNOSIS — N12 Tubulo-interstitial nephritis, not specified as acute or chronic: Secondary | ICD-10-CM | POA: Insufficient documentation

## 2021-06-18 DIAGNOSIS — R1030 Lower abdominal pain, unspecified: Secondary | ICD-10-CM | POA: Diagnosis present

## 2021-06-18 DIAGNOSIS — K219 Gastro-esophageal reflux disease without esophagitis: Secondary | ICD-10-CM | POA: Diagnosis not present

## 2021-06-18 NOTE — ED Triage Notes (Addendum)
Pt reports RLQ pain  that goes to his back since this morning.  Also endorses nausea - had hx of kidney stones 20 yrs ago. Also endorses blood in urine

## 2021-06-19 ENCOUNTER — Encounter (HOSPITAL_BASED_OUTPATIENT_CLINIC_OR_DEPARTMENT_OTHER): Payer: Self-pay | Admitting: Radiology

## 2021-06-19 ENCOUNTER — Emergency Department (HOSPITAL_BASED_OUTPATIENT_CLINIC_OR_DEPARTMENT_OTHER): Payer: 59

## 2021-06-19 ENCOUNTER — Emergency Department (HOSPITAL_BASED_OUTPATIENT_CLINIC_OR_DEPARTMENT_OTHER)
Admission: EM | Admit: 2021-06-19 | Discharge: 2021-06-19 | Disposition: A | Discharge status: Home / Self Care | Payer: 59 | Attending: Emergency Medicine | Admitting: Emergency Medicine

## 2021-06-19 DIAGNOSIS — N12 Tubulo-interstitial nephritis, not specified as acute or chronic: Secondary | ICD-10-CM

## 2021-06-19 DIAGNOSIS — R109 Unspecified abdominal pain: Secondary | ICD-10-CM

## 2021-06-19 LAB — URINALYSIS, MICROSCOPIC (REFLEX)

## 2021-06-19 LAB — COMPREHENSIVE METABOLIC PANEL
ALT: 224 U/L — ABNORMAL HIGH (ref 0–44)
AST: 88 U/L — ABNORMAL HIGH (ref 15–41)
Albumin: 4 g/dL (ref 3.5–5.0)
Alkaline Phosphatase: 215 U/L — ABNORMAL HIGH (ref 38–126)
Anion gap: 12 (ref 5–15)
BUN: 16 mg/dL (ref 6–20)
CO2: 26 mmol/L (ref 22–32)
Calcium: 9.3 mg/dL (ref 8.9–10.3)
Chloride: 98 mmol/L (ref 98–111)
Creatinine, Ser: 0.88 mg/dL (ref 0.44–1.00)
GFR, Estimated: >60 mL/min (ref >60)
Glucose, Bld: 111 mg/dL — ABNORMAL HIGH (ref 70–99)
Potassium: 3.5 mmol/L (ref 3.5–5.1)
Sodium: 136 mmol/L (ref 135–145)
Total Bilirubin: 0.6 mg/dL (ref 0.3–1.2)
Total Protein: 7.4 g/dL (ref 6.5–8.1)

## 2021-06-19 LAB — CBC WITH DIFFERENTIAL/PLATELET
Abs Immature Granulocytes: 0.03 10*3/uL (ref 0.00–0.07)
Basophils Absolute: 0.1 10*3/uL (ref 0.0–0.1)
Basophils Relative: 1 %
Eosinophils Absolute: 0.3 10*3/uL (ref 0.0–0.5)
Eosinophils Relative: 3 %
HCT: 39 % (ref 36.0–46.0)
Hemoglobin: 12.9 g/dL (ref 12.0–15.0)
Immature Granulocytes: 0 %
Lymphocytes Relative: 26 %
Lymphs Abs: 2.7 10*3/uL (ref 0.7–4.0)
MCH: 27.4 pg (ref 26.0–34.0)
MCHC: 33.1 g/dL (ref 30.0–36.0)
MCV: 83 fL (ref 80.0–100.0)
Monocytes Absolute: 1.1 10*3/uL — ABNORMAL HIGH (ref 0.1–1.0)
Monocytes Relative: 11 %
Neutro Abs: 6 10*3/uL (ref 1.7–7.7)
Neutrophils Relative %: 59 %
Platelets: 195 10*3/uL (ref 150–400)
RBC: 4.7 MIL/uL (ref 3.87–5.11)
RDW: 13.8 % (ref 11.5–15.5)
WBC: 10.2 10*3/uL (ref 4.0–10.5)
nRBC: 0 % (ref 0.0–0.2)

## 2021-06-19 LAB — URINALYSIS, ROUTINE W REFLEX MICROSCOPIC
Bilirubin Urine: NEGATIVE
Glucose, UA: NEGATIVE mg/dL
Hgb urine dipstick: NEGATIVE
Ketones, ur: NEGATIVE mg/dL
Nitrite: NEGATIVE
Protein, ur: NEGATIVE mg/dL
Specific Gravity, Urine: 1.01 (ref 1.005–1.030)
pH: 7 (ref 5.0–8.0)

## 2021-06-19 LAB — LIPASE, BLOOD: Lipase: 24 U/L (ref 11–51)

## 2021-06-19 IMAGING — CT CT ABD-PELV W/ CM
2 of 5 series · 16 of 46 positions shown, 18 images · IV contrast (APPLIED)
Comparison: [DATE]

CLINICAL DATA: Right lower quadrant pain since this morning

EXAM:
CT ABDOMEN AND PELVIS WITH CONTRAST
TECHNIQUE: Multidetector CT imaging of the abdomen and pelvis was performed
using the standard protocol following bolus administration of
intravenous contrast.
CONTRAST:  80mL OMNIPAQUE IOHEXOL 350 MG/ML SOLN

[Series 2: abd pel w · axial · 0.80mm/px · z∈[-1026,-571]mm · 13 of 103 slices shown, 15 images]
[im 6/103  soft-tissue]
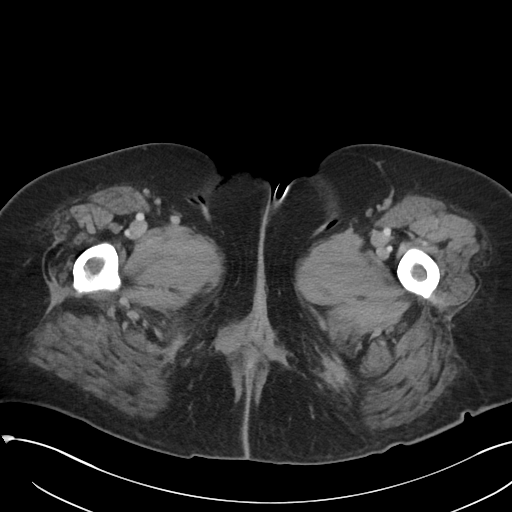
[im 6/103  bone]
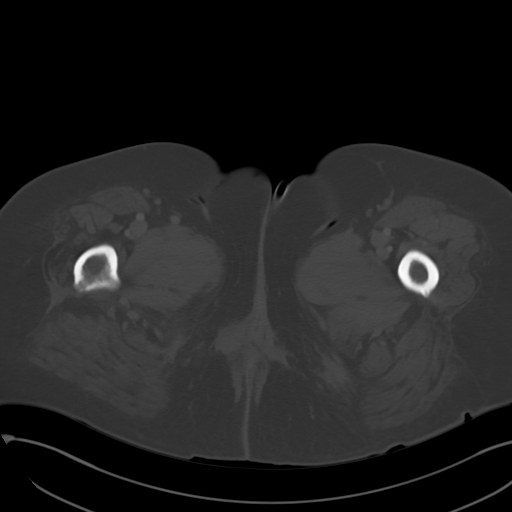
[im 12/103  soft-tissue]
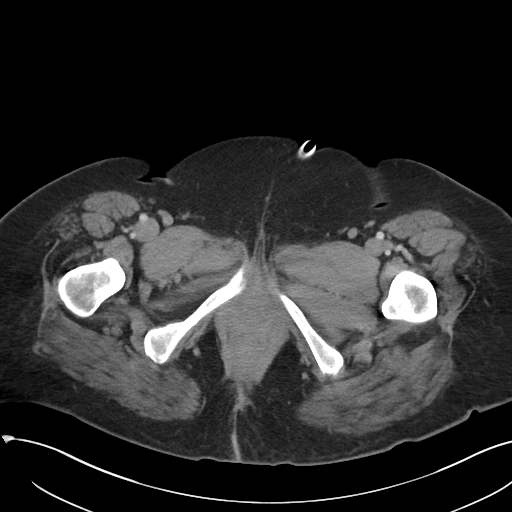
[im 23/103  soft-tissue]
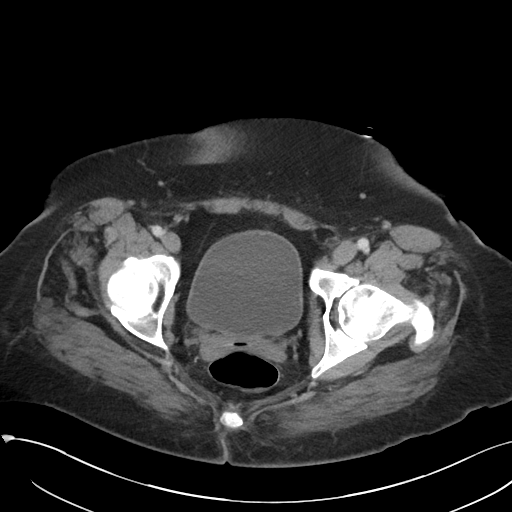
[im 29/103  soft-tissue]
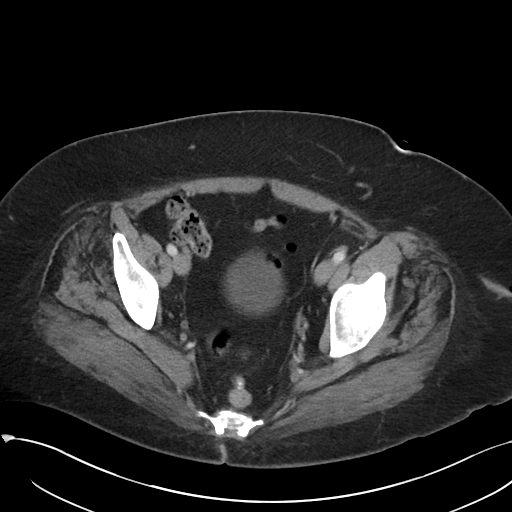
[im 35/103  soft-tissue]
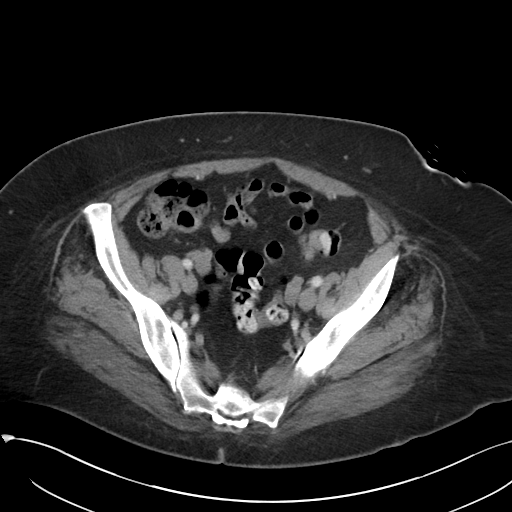
[im 46/103  soft-tissue]
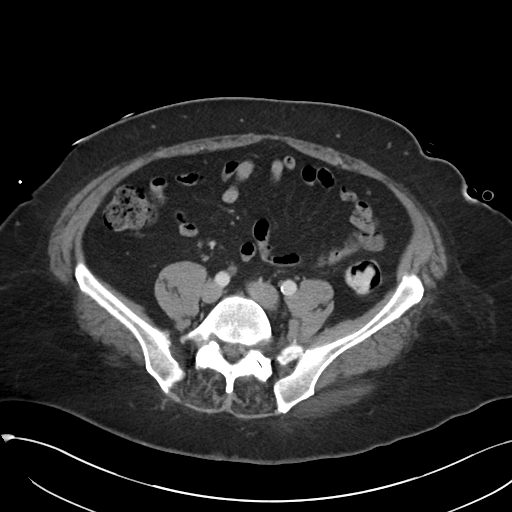
[im 52/103  soft-tissue]
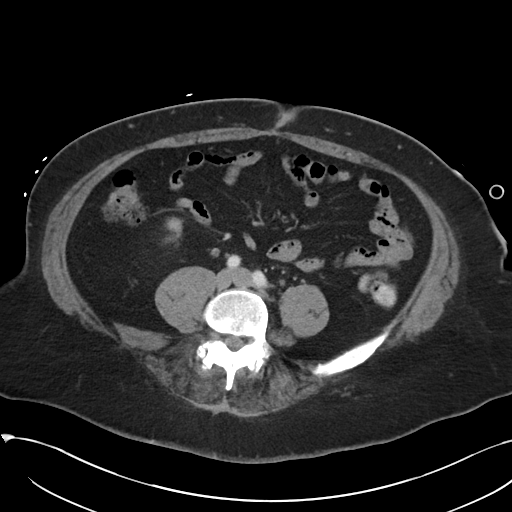
[im 57/103  soft-tissue]
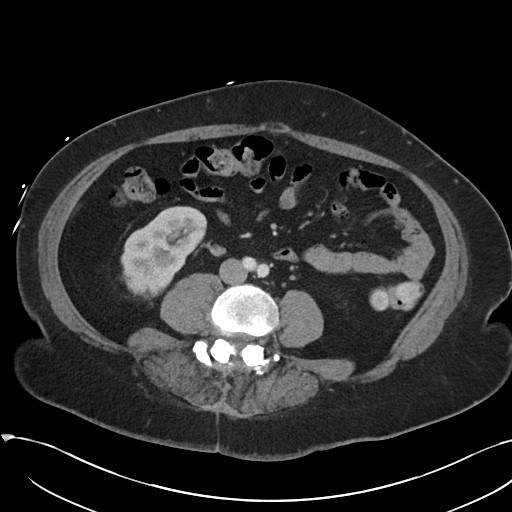
[im 69/103  soft-tissue]
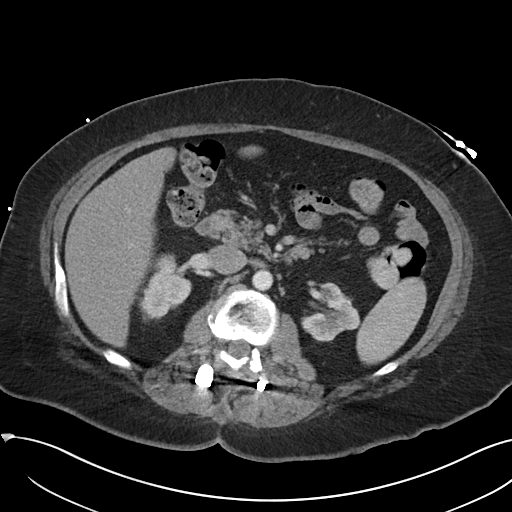
[im 69/103  bone]
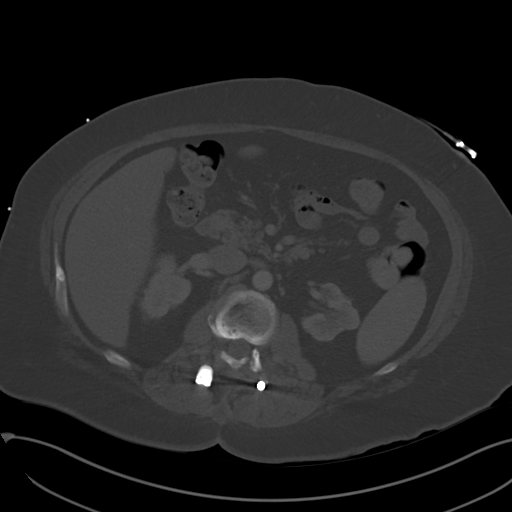
[im 74/103  soft-tissue]
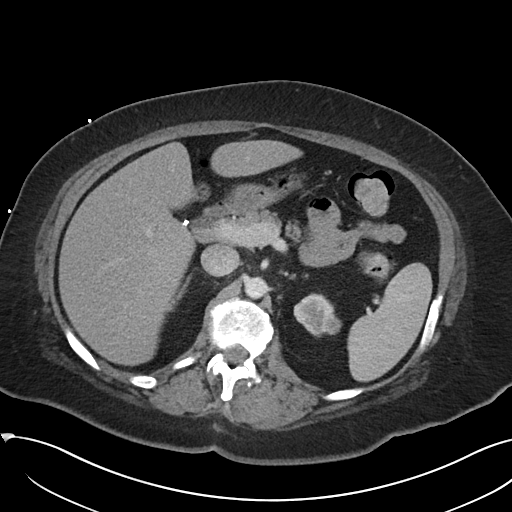
[im 80/103  soft-tissue]
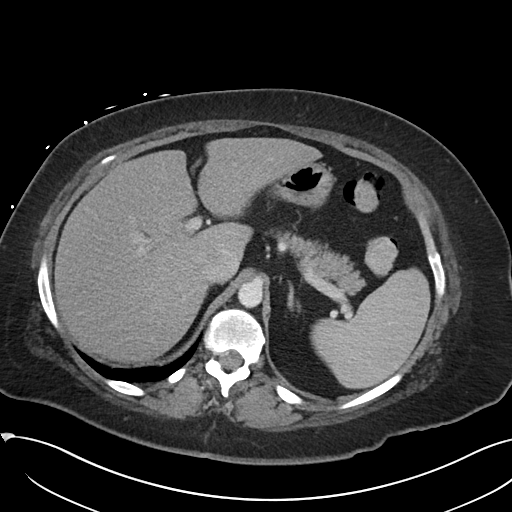
[im 91/103  soft-tissue]
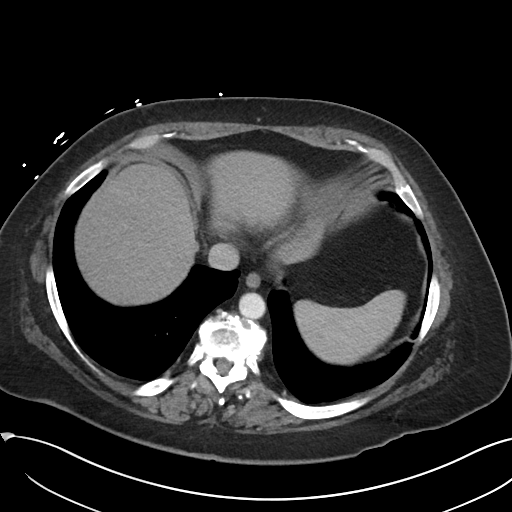
[im 97/103  soft-tissue]
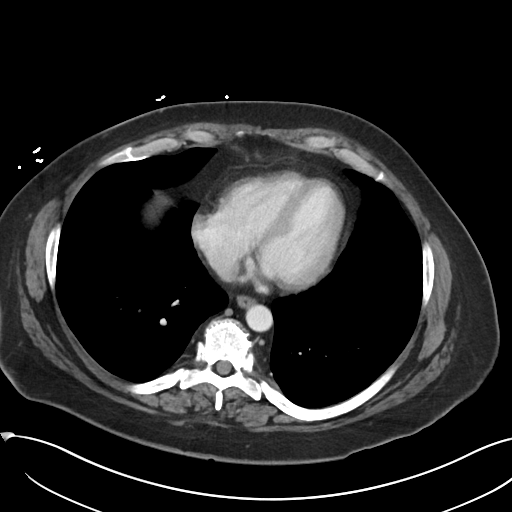

[Series 5: coronal · coronal · 0.80mm/px · 3 of 104 slices shown]
[im 35/104  soft-tissue]
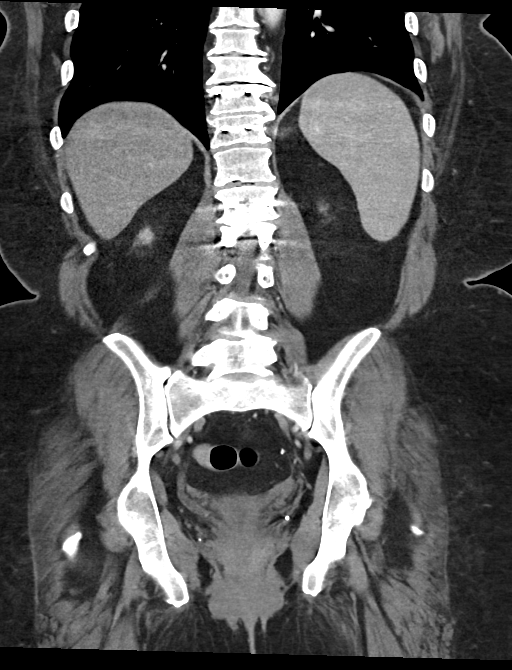
[im 46/104  soft-tissue]
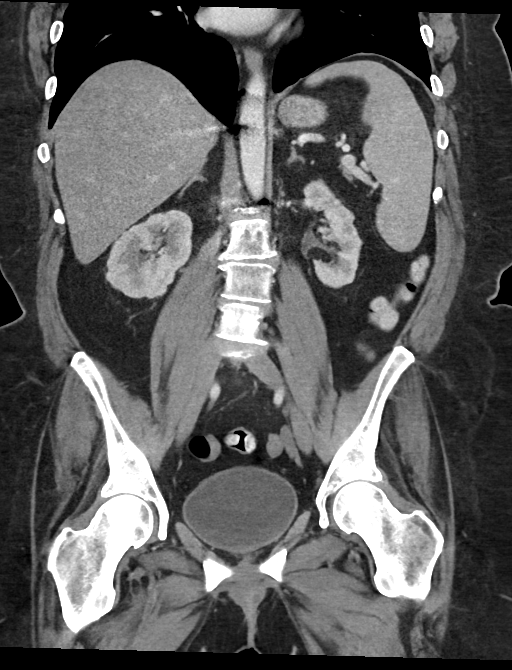
[im 58/104  soft-tissue]
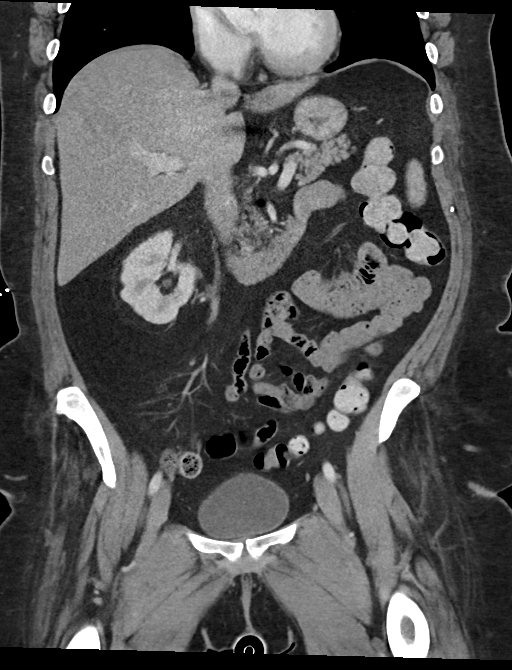

[16 of 46 positions shown; findings below may reference images not displayed]

FINDINGS: Lower chest:  No contributory findings.

Hepatobiliary: No focal liver abnormality.Cholecystectomy. No bile
duct dilatation.

Pancreas: Unremarkable.

Spleen: Unremarkable.

Adrenals/Urinary Tract: Negative adrenals. Lobulated renal cortex
from scarring, left worse than right. Unremarkable bladder.

Stomach/Bowel: No obstruction. No bowel inflammation, including
appendicitis.

Vascular/Lymphatic: No acute vascular abnormality. No mass or
adenopathy.

Reproductive:Hysterectomy

Other: No ascites or pneumoperitoneum.

Musculoskeletal: No acute abnormalities. Generalized lumbar spine
degeneration with decompressions and thoracolumbar posterior-lateral
fusions. Interval right-sided pedicle screw fracture noted at L2,
with no L1-2 solid arthrodesis demonstrated.
IMPRESSION: 1. No acute finding.  No appendicitis or urolithiasis.
2. Left more than right renal cortical scarring.
3. Posterior-lateral fusion at L1-2 with interval right L2 pedicle
screw fracture and lack of bridging bone, possible pseudoarthrosis.

## 2021-06-19 MED ORDER — FENTANYL CITRATE PF 50 MCG/ML IJ SOSY
50.0000 ug | PREFILLED_SYRINGE | Freq: Once | INTRAMUSCULAR | Status: AC
  Administered 2021-06-19: 50 ug via INTRAVENOUS
  Filled 2021-06-19: qty 1

## 2021-06-19 MED ORDER — ONDANSETRON 4 MG PO TBDP: 4.0000 mg | ORAL_TABLET | Freq: Three times a day (TID) | ORAL | 0 refills | Status: DC | PRN

## 2021-06-19 MED ORDER — SODIUM CHLORIDE 0.9 % IV BOLUS
1000.0000 mL | Freq: Once | INTRAVENOUS | Status: AC
  Administered 2021-06-19: 1000 mL via INTRAVENOUS

## 2021-06-19 MED ORDER — CEPHALEXIN 500 MG PO CAPS: 500.0000 mg | ORAL_CAPSULE | Freq: Three times a day (TID) | ORAL | 0 refills | Status: DC

## 2021-06-19 MED ORDER — KETOROLAC TROMETHAMINE 15 MG/ML IJ SOLN
15.0000 mg | Freq: Once | INTRAMUSCULAR | Status: AC
  Administered 2021-06-19: 15 mg via INTRAVENOUS
  Filled 2021-06-19: qty 1

## 2021-06-19 MED ORDER — SODIUM CHLORIDE 0.9 % IV SOLN
1.0000 g | Freq: Once | INTRAVENOUS | Status: AC
  Administered 2021-06-19: 1 g via INTRAVENOUS
  Filled 2021-06-19: qty 10

## 2021-06-19 MED ORDER — IOHEXOL 350 MG/ML SOLN
80.0000 mL | Freq: Once | INTRAVENOUS | Status: AC | PRN
  Administered 2021-06-19: 80 mL via INTRAVENOUS

## 2021-06-19 NOTE — ED Provider Notes (Signed)
[facility] EMERGENCY DEPT Provider Note  CSN: [id number] Arrival date & time: 06/18/21 2232  Chief Complaint(s) Abdominal Pain and Nausea  HPI Mary Chambers is a 50 y.o. female here for 3 days of gradually worsening right-sided flank and right lower quadrants aching pain.  Pain began in the flank and spread to the right lower quadrant.  Worse with movement and palpation.  Patient also having difficulty voiding.  She also endorses hematuria.  Reports a prior history of renal stones on the right.  No dysuria.  No frequency.  No nausea or vomiting.  No diarrhea.  No fevers.  No other alleviating or aggravating factors.  No other physical complaints.  HPI  Past Medical History Past Medical History:  Diagnosis Date   Arthritis    Depression    Hypertension    Kidney stone    Obesity    Patient Active Problem List   Diagnosis Date Noted   Gastroesophageal reflux disease 12/15/2020   Screening for colorectal cancer 12/15/2020   Nausea without vomiting 12/15/2020   History of gastric ulcer 12/15/2020   Herniated lumbar disc without myelopathy 10/02/2019   Lumbar spinal stenosis 10/01/2019   Pain in left foot 03/29/2018   Arthritis 02/13/2018   Chronic back pain 02/13/2018   Migraines 02/13/2018   Morbid obesity with BMI of 40.0-44.9, adult ([place]) 02/13/2018   Synovial cyst of lumbar spine 10/15/2017   Osteoarthritis of knee 07/13/2017   Lumbar disc disease with radiculopathy 08/01/2016   Fatigue 05/04/2016   Headache 05/04/2016   Numbness of foot 05/04/2016   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   Arthrodesis status 02/02/2016   Chronic pain syndrome 02/02/2016   Elevated white blood cell count 12/01/2015   Lumbar stenosis with neurogenic claudication 05/07/2015   Bilateral leg edema 04/21/2015   Carpal tunnel syndrome of right wrist 12/14/2014   Renal atrophy, left 01/23/2014   Achilles tendonitis 01/16/2014   Right lower quadrant pain 12/18/2013    Hypertension, benign 06/03/2010   Urinary incontinence 06/03/2010   Cervical disc disease 09/09/2009   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 Authorizing Provider  cephALEXin (KEFLEX) 500 MG capsule Take 1 capsule (500 mg total) by mouth 3 (three) times daily for 10 days. 06/19/21 06/29/21 Yes Icela Glymph, Grayce Sessions, MD  ondansetron (ZOFRAN ODT) 4 MG disintegrating tablet Take 1 tablet (4 mg total) by mouth every 8 (eight) hours as needed for up to 3 days for nausea or vomiting. 06/19/21 06/22/21 Yes Kejuan Bekker, Grayce Sessions, MD  Ascorbic Acid (VITAMIN C) 500 MG CAPS Take 500 mg by mouth daily.    [provider]  benzonatate (TESSALON) 100 MG capsule Take 1 capsule (100 mg total) by mouth 3 (three) times daily as needed. 05/03/21   Mar Daring, PA-C  cholecalciferol (VITAMIN D3) 25 MCG (1000 UT) tablet Take 1,000 Units by mouth daily.    [provider]  cyclobenzaprine (FLEXERIL) 10 MG tablet Take 1 tablet (10 mg total) by mouth 3 (three) times daily as needed for muscle spasms. 03/30/21   Brunetta Jeans, PA-C  enalapril (VASOTEC) 10 MG tablet Take 10 mg by mouth daily.    [provider]  fluticasone (FLONASE) 50 MCG/ACT nasal spray Place 2 sprays into both nostrils daily. 05/03/21   Brunetta Jeans, PA-C  hydrochlorothiazide (HYDRODIURIL) 25 MG tablet Take 25 mg by mouth daily.    [provider]  Multiple Vitamin (  MULTIVITAMIN) capsule Take 1 capsule by mouth daily.     [provider]  naproxen (NAPROSYN) 500 MG tablet Take 1 tablet (500 mg total) by mouth 2 (two) times daily with a meal. 03/30/21   Brunetta Jeans, PA-C  pantoprazole (PROTONIX) 20 MG tablet Take 1 tablet (20 mg total) by mouth daily. 04/22/20   Quintella Reichert, MD  POTASSIUM PO Take 1,000 mg by mouth daily.    [provider]  traMADol (ULTRAM) 50 MG tablet Take 50 mg by mouth 3  (three) times daily as needed. 02/02/21   [provider]  diphenhydrAMINE (BENADRYL) 25 mg capsule Take by mouth. 03/10/16 06/21/19  [provider]  LYRICA 50 MG capsule Take 50 mg by mouth 3 (three) times daily. 04/15/18 06/07/19  [provider]                                                                                                                                    Past Surgical History Past Surgical History:  Procedure Laterality Date   ABDOMINAL HYSTERECTOMY     BACK SURGERY     CARPAL TUNNEL RELEASE     CHOLECYSTECTOMY     LITHOTRIPSY     neck fusion     TUBAL LIGATION     Family History Family History  Problem Relation Age of Onset   Diabetes Mother    Hypertension Mother    Cancer Father    Parkinson's disease Father    Heart disease Father    Heart disease Sister    Heart disease Brother    Colon cancer Neg Hx    Rectal cancer Neg Hx    Stomach cancer Neg Hx     Social History Social History   Tobacco Use   Smoking status: Never   Smokeless tobacco: Never  Vaping Use   Vaping Use: Never used  Substance Use Topics   Alcohol use: No   Drug use: Never   Allergies Baclofen, Doxycycline, Percocet [oxycodone-acetaminophen], Septra [sulfamethoxazole-trimethoprim], and Gabapentin  Review of Systems Review of Systems All other systems are reviewed and are negative for acute change except as noted in the HPI  Physical Exam Vital Signs  I have reviewed the triage vital signs BP 137/88 (BP Location: Right Wrist)   Pulse (!) 115   Temp 99.7 F (37.6 C) (Oral)   Resp 20   SpO2 97%   Physical Exam Vitals reviewed.  Constitutional:      General: She is not in acute distress.    Appearance: She is well-developed. She is obese. She is not diaphoretic.  HENT:     Head: Normocephalic and atraumatic.     Right Ear: External ear normal.     Left Ear: External ear normal.     Nose: Nose normal.  Eyes:     General: No scleral  icterus.    Conjunctiva/sclera: Conjunctivae normal.  Neck:  Trachea: Phonation normal.  Cardiovascular:     Rate and Rhythm: Regular rhythm.  Pulmonary:     Effort: Pulmonary effort is normal. No respiratory distress.     Breath sounds: No stridor.  Abdominal:     General: There is no distension.     Tenderness: There is abdominal tenderness in the right lower quadrant. There is right CVA tenderness.  Musculoskeletal:        General: Normal range of motion.     Cervical back: Normal range of motion.  Neurological:     Mental Status: She is alert and oriented to person, place, and time.  Psychiatric:        Behavior: Behavior normal.    ED Results and Treatments Labs (all labs ordered are listed, but only abnormal results are displayed) Labs Reviewed  URINALYSIS, ROUTINE W REFLEX MICROSCOPIC - Abnormal; Notable for the following components:      Result Value   Color, Urine STRAW (*)    Leukocytes,Ua LARGE (*)    All other components within normal limits  CBC WITH DIFFERENTIAL/PLATELET - Abnormal; Notable for the following components:   Monocytes Absolute 1.1 (*)    All other components within normal limits  COMPREHENSIVE METABOLIC PANEL - Abnormal; Notable for the following components:   Glucose, Bld 111 (*)    AST 88 (*)    ALT 224 (*)    Alkaline Phosphatase 215 (*)    All other components within normal limits  URINALYSIS, MICROSCOPIC (REFLEX) - Abnormal; Notable for the following components:   Bacteria, UA RARE (*)    All other components within normal limits  LIPASE, BLOOD                                                                                                                         EKG  EKG Interpretation  Date/Time:    Ventricular Rate:    PR Interval:    QRS Duration:   QT Interval:    QTC Calculation:   R Axis:     Text Interpretation:         Radiology CT ABDOMEN PELVIS W CONTRAST  Result Date: 06/19/2021 CLINICAL DATA:  Right lower  quadrant pain since this morning EXAM: CT ABDOMEN AND PELVIS WITH CONTRAST TECHNIQUE: Multidetector CT imaging of the abdomen and pelvis was performed using the standard protocol following bolus administration of intravenous contrast. CONTRAST:  58m OMNIPAQUE IOHEXOL 350 MG/ML SOLN COMPARISON:  07/25/2019 FINDINGS: Lower chest:  No contributory findings. Hepatobiliary: No focal liver abnormality.Cholecystectomy. No bile duct dilatation. Pancreas: Unremarkable. Spleen: Unremarkable. Adrenals/Urinary Tract: Negative adrenals. Lobulated renal cortex from scarring, left worse than right. Unremarkable bladder. Stomach/Bowel: No obstruction. No bowel inflammation, including appendicitis. Vascular/Lymphatic: No acute vascular abnormality. No mass or adenopathy. Reproductive:Hysterectomy Other: No ascites or pneumoperitoneum. Musculoskeletal: No acute abnormalities. Generalized lumbar spine degeneration with decompressions and thoracolumbar posterior-lateral fusions. Interval right-sided pedicle screw fracture noted at L2, with no L1-2 solid arthrodesis demonstrated. IMPRESSION: 1. No acute  finding.  No appendicitis or urolithiasis. 2. 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 3. Posterior-lateral fusion at L1-2 with interval right L2 pedicle screw fracture and lack of bridging bone, possible pseudoarthrosis. Electronically Signed   By: Jorje Guild M.D.   On: 06/19/2021 04:18    Pertinent labs & imaging results that were available during my care of the patient were reviewed by me and considered in my medical decision making (see MDM for details).  Medications Ordered in ED Medications  fentaNYL (SUBLIMAZE) injection 50 mcg (50 mcg Intravenous Given 06/19/21 0219)  sodium chloride 0.9 % bolus 1,000 mL (0 mLs Intravenous Stopped 06/19/21 0440)  iohexol (OMNIPAQUE) 350 MG/ML injection 80 mL (80 mLs Intravenous Contrast Given 06/19/21 0355)  cefTRIAXone (ROCEPHIN) 1 g in sodium chloride 0.9 % 100 mL IVPB (0 g  Intravenous Stopped 06/19/21 0537)  ketorolac (TORADOL) 15 MG/ML injection 15 mg (15 mg Intravenous Given 06/19/21 0451)                                                                                                                                     Procedures Procedures  (including critical care time)  Medical Decision Making / ED Course I have reviewed the nursing notes for this encounter and the patient's prior records (if available in EHR or on provided paperwork).  Mary Chambers was evaluated in Emergency Department on 06/19/2021 for the symptoms described in the history of present illness. She was evaluated in the context of the global COVID-19 pandemic, which necessitated consideration that the patient might be at risk for infection with the SARS-CoV-2 virus that causes COVID-19. Institutional protocols and algorithms that pertain to the evaluation of patients at risk for COVID-19 are in a state of rapid change based on information released by regulatory bodies including the CDC and federal and state organizations. These policies and algorithms were followed during the patient's care in the ED.      Pertinent labs & imaging results that were available during my care of the patient were reviewed by me and considered in my medical decision making:  Work-up most consistent with ascending UTI/pyelonephritis.  Patient is not septic.  Appropriate for outpatient management.  Given first dose of antibiotic in the emergency department..  Work-up also notable for elevated LFTs.  CT scan without biliary dilatation.  Patient is status post cholecystectomy.  Patient has nonalcoholic and has not been taking Tylenol.  Etiology of elevated FTs undetermined at this time.  Recommended close follow-up with PCP for trending and further work-up if needed.  CT scan negative for appendicitis or other serious intra-abdominal inflammatory/infectious process.  Scan did reveal evidence of right  pedicle screw fracture.  Patient was already aware of this and is discussing management options with spine doctors.  Other possible etiologies of the right flank pain could be radiculopathy.     Final Clinical Impression(s) / ED Diagnoses Final diagnoses:  Right flank pain  Pyelonephritis   The patient appears reasonably screened and/or stabilized for discharge and I doubt any other medical condition or other [facility] requiring further screening, evaluation, or treatment in the ED at this time prior to discharge. Safe for discharge with strict return precautions.  Disposition: Discharge  Condition: Good  I have discussed the results, Dx and Tx plan with the patient/family who expressed understanding and agree(s) with the plan. Discharge instructions discussed at length. The patient/family was given strict return precautions who verbalized understanding of the instructions. No further questions at time of discharge.    ED Discharge Orders          Ordered    ondansetron (ZOFRAN ODT) 4 MG disintegrating tablet  Every 8 hours PRN        06/19/21 0554    cephALEXin (KEFLEX) 500 MG capsule  3 times daily        06/19/21 0554             Follow Up: [name], NP [place] [address] [phone]  Call  to schedule an appointment for close follow up    This chart was dictated using voice recognition software.  Despite best efforts to proofread,  errors can occur which can change the documentation meaning.    Fatima Blank, MD 06/19/21 [phone]

## 2021-06-22 ENCOUNTER — Encounter (HOSPITAL_COMMUNITY): Payer: Self-pay | Admitting: Emergency Medicine

## 2021-06-22 ENCOUNTER — Inpatient Hospital Stay (HOSPITAL_BASED_OUTPATIENT_CLINIC_OR_DEPARTMENT_OTHER)
Admission: EM | Admit: 2021-06-22 | Discharge: 2021-07-01 | DRG: 871 | Disposition: A | Discharge status: Home Health | Payer: 59 | Attending: Family Medicine | Admitting: Family Medicine

## 2021-06-22 ENCOUNTER — Emergency Department (HOSPITAL_BASED_OUTPATIENT_CLINIC_OR_DEPARTMENT_OTHER): Payer: 59

## 2021-06-22 ENCOUNTER — Other Ambulatory Visit: Payer: Self-pay

## 2021-06-22 ENCOUNTER — Emergency Department (HOSPITAL_COMMUNITY)
Admission: EM | Admit: 2021-06-22 | Discharge: 2021-06-22 | Disposition: A | Discharge status: Home / Self Care | Payer: 59 | Source: Home / Self Care

## 2021-06-22 ENCOUNTER — Encounter (HOSPITAL_BASED_OUTPATIENT_CLINIC_OR_DEPARTMENT_OTHER): Payer: Self-pay

## 2021-06-22 DIAGNOSIS — M009 Pyogenic arthritis, unspecified: Secondary | ICD-10-CM | POA: Diagnosis present

## 2021-06-22 DIAGNOSIS — G894 Chronic pain syndrome: Secondary | ICD-10-CM | POA: Diagnosis not present

## 2021-06-22 DIAGNOSIS — Z79899 Other long term (current) drug therapy: Secondary | ICD-10-CM | POA: Diagnosis not present

## 2021-06-22 DIAGNOSIS — G4700 Insomnia, unspecified: Secondary | ICD-10-CM | POA: Diagnosis present

## 2021-06-22 DIAGNOSIS — Z87442 Personal history of urinary calculi: Secondary | ICD-10-CM

## 2021-06-22 DIAGNOSIS — T829XXA Unspecified complication of cardiac and vascular prosthetic device, implant and graft, initial encounter: Secondary | ICD-10-CM

## 2021-06-22 DIAGNOSIS — Z79891 Long term (current) use of opiate analgesic: Secondary | ICD-10-CM

## 2021-06-22 DIAGNOSIS — L0291 Cutaneous abscess, unspecified: Secondary | ICD-10-CM | POA: Clinically undetermined

## 2021-06-22 DIAGNOSIS — Z6841 Body Mass Index (BMI) 40.0 and over, adult: Secondary | ICD-10-CM | POA: Diagnosis not present

## 2021-06-22 DIAGNOSIS — Q231 Congenital insufficiency of aortic valve: Secondary | ICD-10-CM

## 2021-06-22 DIAGNOSIS — U071 COVID-19: Secondary | ICD-10-CM | POA: Diagnosis not present

## 2021-06-22 DIAGNOSIS — M4620 Osteomyelitis of vertebra, site unspecified: Secondary | ICD-10-CM

## 2021-06-22 DIAGNOSIS — I10 Essential (primary) hypertension: Secondary | ICD-10-CM | POA: Diagnosis present

## 2021-06-22 DIAGNOSIS — M25551 Pain in right hip: Secondary | ICD-10-CM | POA: Insufficient documentation

## 2021-06-22 DIAGNOSIS — Z5321 Procedure and treatment not carried out due to patient leaving prior to being seen by health care provider: Secondary | ICD-10-CM | POA: Insufficient documentation

## 2021-06-22 DIAGNOSIS — F32A Depression, unspecified: Secondary | ICD-10-CM | POA: Diagnosis not present

## 2021-06-22 DIAGNOSIS — N12 Tubulo-interstitial nephritis, not specified as acute or chronic: Secondary | ICD-10-CM | POA: Diagnosis not present

## 2021-06-22 DIAGNOSIS — M1990 Unspecified osteoarthritis, unspecified site: Secondary | ICD-10-CM | POA: Diagnosis not present

## 2021-06-22 DIAGNOSIS — R32 Unspecified urinary incontinence: Secondary | ICD-10-CM | POA: Diagnosis not present

## 2021-06-22 DIAGNOSIS — G4730 Sleep apnea, unspecified: Secondary | ICD-10-CM | POA: Diagnosis not present

## 2021-06-22 DIAGNOSIS — T80219A Unspecified infection due to central venous catheter, initial encounter: Secondary | ICD-10-CM

## 2021-06-22 DIAGNOSIS — K219 Gastro-esophageal reflux disease without esophagitis: Secondary | ICD-10-CM | POA: Diagnosis not present

## 2021-06-22 DIAGNOSIS — Z888 Allergy status to other drugs, medicaments and biological substances status: Secondary | ICD-10-CM

## 2021-06-22 DIAGNOSIS — I330 Acute and subacute infective endocarditis: Secondary | ICD-10-CM | POA: Diagnosis not present

## 2021-06-22 DIAGNOSIS — G061 Intraspinal abscess and granuloma: Secondary | ICD-10-CM | POA: Diagnosis present

## 2021-06-22 DIAGNOSIS — F419 Anxiety disorder, unspecified: Secondary | ICD-10-CM | POA: Diagnosis present

## 2021-06-22 DIAGNOSIS — A4181 Sepsis due to Enterococcus: Secondary | ICD-10-CM | POA: Diagnosis not present

## 2021-06-22 DIAGNOSIS — R7401 Elevation of levels of liver transaminase levels: Secondary | ICD-10-CM | POA: Diagnosis not present

## 2021-06-22 DIAGNOSIS — M4656 Other infective spondylopathies, lumbar region: Secondary | ICD-10-CM | POA: Diagnosis not present

## 2021-06-22 DIAGNOSIS — M549 Dorsalgia, unspecified: Secondary | ICD-10-CM

## 2021-06-22 DIAGNOSIS — I119 Hypertensive heart disease without heart failure: Secondary | ICD-10-CM | POA: Diagnosis present

## 2021-06-22 DIAGNOSIS — K06010 Localized gingival recession, unspecified: Secondary | ICD-10-CM | POA: Diagnosis not present

## 2021-06-22 DIAGNOSIS — R0602 Shortness of breath: Secondary | ICD-10-CM | POA: Diagnosis not present

## 2021-06-22 DIAGNOSIS — R0902 Hypoxemia: Secondary | ICD-10-CM | POA: Diagnosis present

## 2021-06-22 DIAGNOSIS — Z8249 Family history of ischemic heart disease and other diseases of the circulatory system: Secondary | ICD-10-CM | POA: Diagnosis not present

## 2021-06-22 DIAGNOSIS — E785 Hyperlipidemia, unspecified: Secondary | ICD-10-CM | POA: Diagnosis present

## 2021-06-22 DIAGNOSIS — Z90710 Acquired absence of both cervix and uterus: Secondary | ICD-10-CM

## 2021-06-22 DIAGNOSIS — Z885 Allergy status to narcotic agent status: Secondary | ICD-10-CM

## 2021-06-22 DIAGNOSIS — M545 Low back pain, unspecified: Secondary | ICD-10-CM | POA: Insufficient documentation

## 2021-06-22 DIAGNOSIS — R509 Fever, unspecified: Secondary | ICD-10-CM

## 2021-06-22 DIAGNOSIS — R3989 Other symptoms and signs involving the genitourinary system: Secondary | ICD-10-CM | POA: Diagnosis present

## 2021-06-22 DIAGNOSIS — I351 Nonrheumatic aortic (valve) insufficiency: Secondary | ICD-10-CM

## 2021-06-22 DIAGNOSIS — Z9049 Acquired absence of other specified parts of digestive tract: Secondary | ICD-10-CM

## 2021-06-22 DIAGNOSIS — Z87440 Personal history of urinary (tract) infections: Secondary | ICD-10-CM

## 2021-06-22 DIAGNOSIS — Z981 Arthrodesis status: Secondary | ICD-10-CM

## 2021-06-22 DIAGNOSIS — Z881 Allergy status to other antibiotic agents status: Secondary | ICD-10-CM

## 2021-06-22 DIAGNOSIS — A419 Sepsis, unspecified organism: Secondary | ICD-10-CM | POA: Diagnosis present

## 2021-06-22 LAB — BASIC METABOLIC PANEL
Anion gap: 13 (ref 5–15)
BUN: 16 mg/dL (ref 6–20)
CO2: 26 mmol/L (ref 22–32)
Calcium: 9.6 mg/dL (ref 8.9–10.3)
Chloride: 100 mmol/L (ref 98–111)
Creatinine, Ser: 0.89 mg/dL (ref 0.44–1.00)
GFR, Estimated: >60 mL/min (ref >60)
Glucose, Bld: 96 mg/dL (ref 70–99)
Potassium: 3.7 mmol/L (ref 3.5–5.1)
Sodium: 139 mmol/L (ref 135–145)

## 2021-06-22 LAB — CBC WITH DIFFERENTIAL/PLATELET
Abs Immature Granulocytes: 0.03 10*3/uL (ref 0.00–0.07)
Basophils Absolute: 0 10*3/uL (ref 0.0–0.1)
Basophils Relative: 0 %
Eosinophils Absolute: 0 10*3/uL (ref 0.0–0.5)
Eosinophils Relative: 0 %
HCT: 38.6 % (ref 36.0–46.0)
Hemoglobin: 12.3 g/dL (ref 12.0–15.0)
Immature Granulocytes: 0 %
Lymphocytes Relative: 24 %
Lymphs Abs: 2.7 10*3/uL (ref 0.7–4.0)
MCH: 26.7 pg (ref 26.0–34.0)
MCHC: 31.9 g/dL (ref 30.0–36.0)
MCV: 83.7 fL (ref 80.0–100.0)
Monocytes Absolute: 1.2 10*3/uL — ABNORMAL HIGH (ref 0.1–1.0)
Monocytes Relative: 11 %
Neutro Abs: 7.2 10*3/uL (ref 1.7–7.7)
Neutrophils Relative %: 65 %
Platelets: 243 10*3/uL (ref 150–400)
RBC: 4.61 MIL/uL (ref 3.87–5.11)
RDW: 14 % (ref 11.5–15.5)
WBC: 11.2 10*3/uL — ABNORMAL HIGH (ref 4.0–10.5)
nRBC: 0 % (ref 0.0–0.2)

## 2021-06-22 IMAGING — CT CT L SPINE W/O CM
3 series · 9 of 33 positions shown, 11 images · IV contrast (APPLIED)
Comparison: MRI from [DATE].

CLINICAL DATA: Initial evaluation for low back pain with right
lower extremity numbness and tingling. Fevers.

EXAM:
CT LUMBAR SPINE WITHOUT CONTRAST
TECHNIQUE: Multidetector CT imaging of the lumbar spine was performed without
intravenous contrast administration. Multiplanar CT image
reconstructions were also generated.

[Series 10: lspine soft · axial · 0.30mm/px · z∈[+867,+867]mm · 1 of 109 slices shown, 2 images]
[im 59/109  soft-tissue]
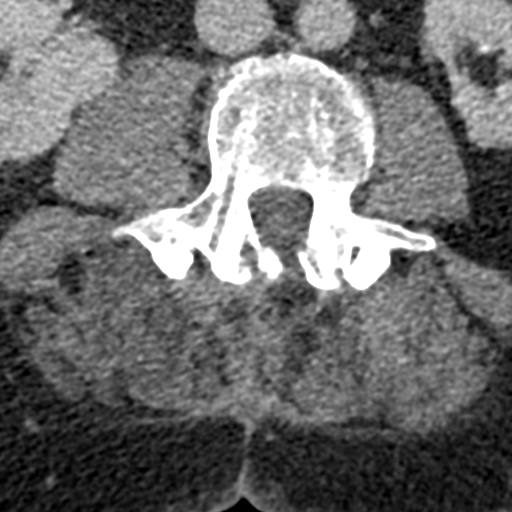
[im 59/109  bone]
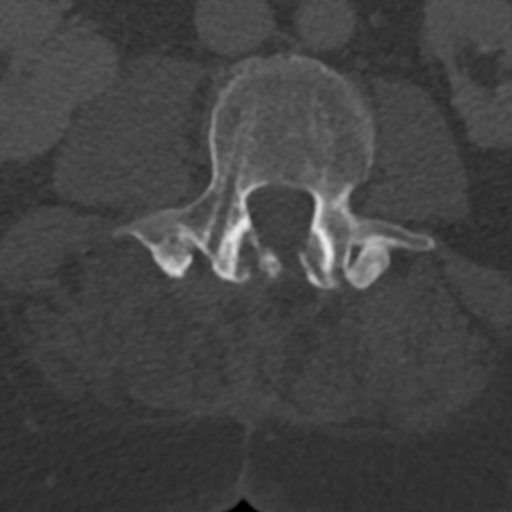

[Series 12: lspine bone cor · coronal · 0.30mm/px · 3 of 61 slices shown]
[im 13/61  bone]
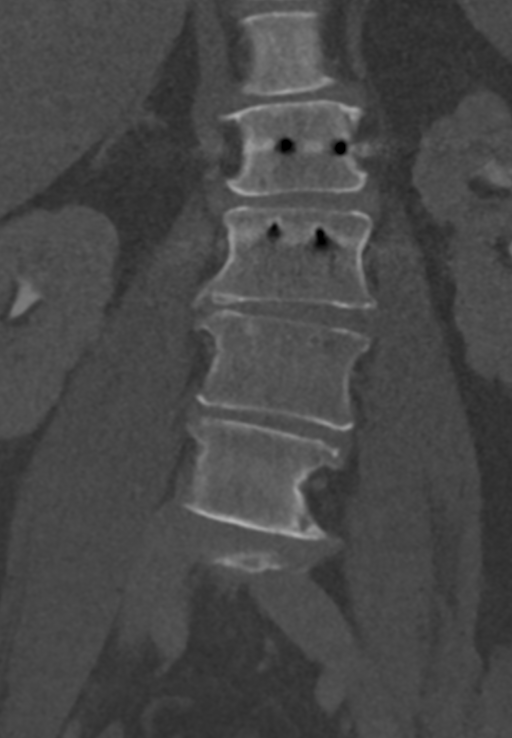
[im 25/61  bone]
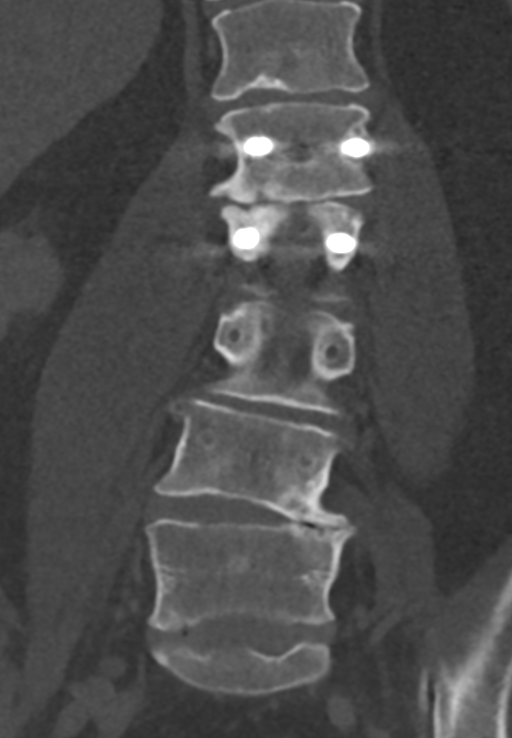
[im 37/61  bone]
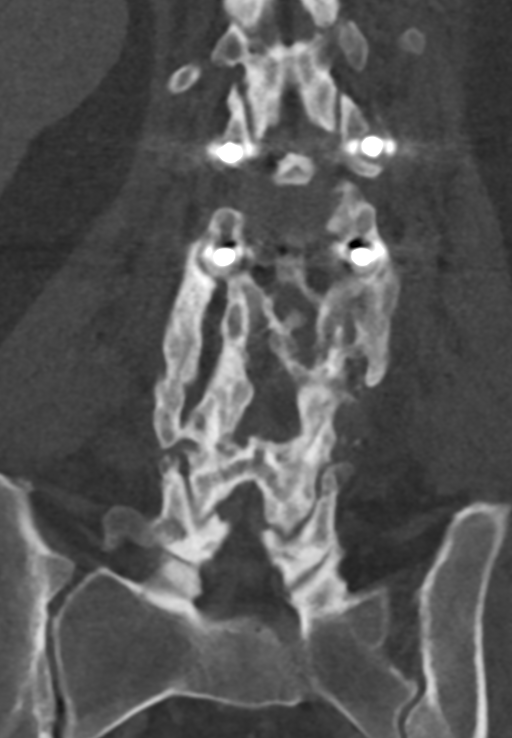

[Series 13: lspine bone sag · sagittal · 0.24mm/px · 5 of 71 slices shown, 6 images]
[im 24/71  bone]
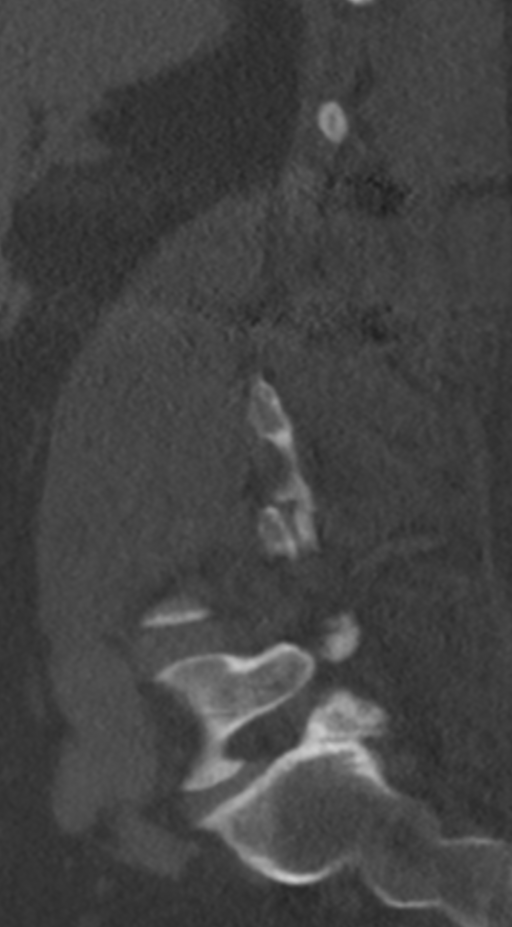
[im 30/71  bone]
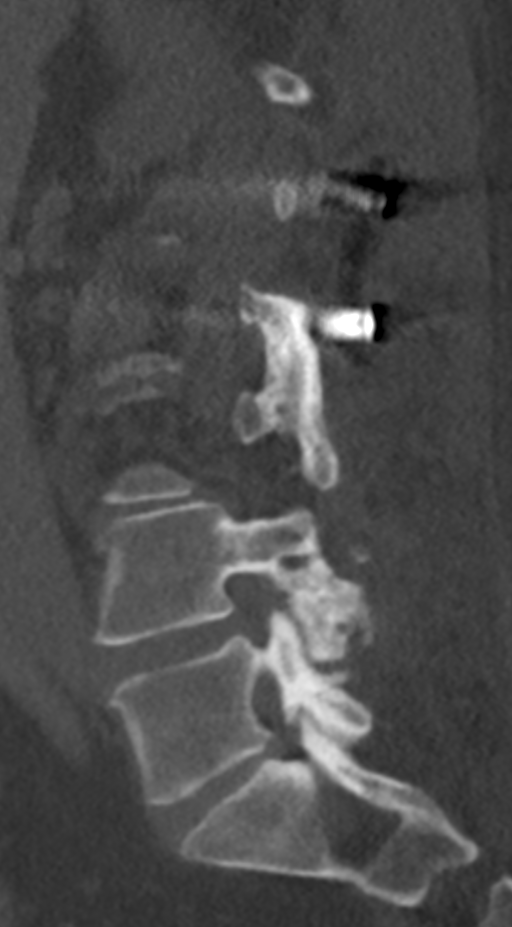
[im 36/71  soft-tissue]
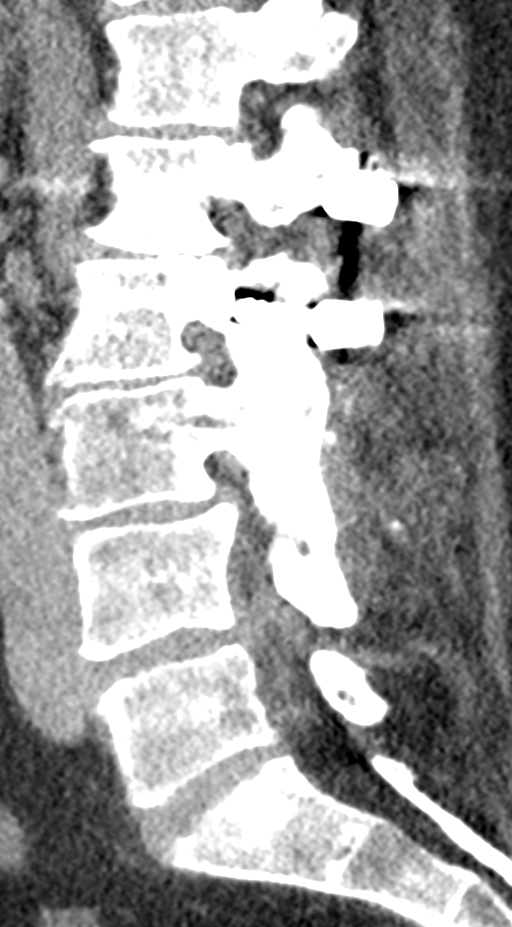
[im 36/71  bone]
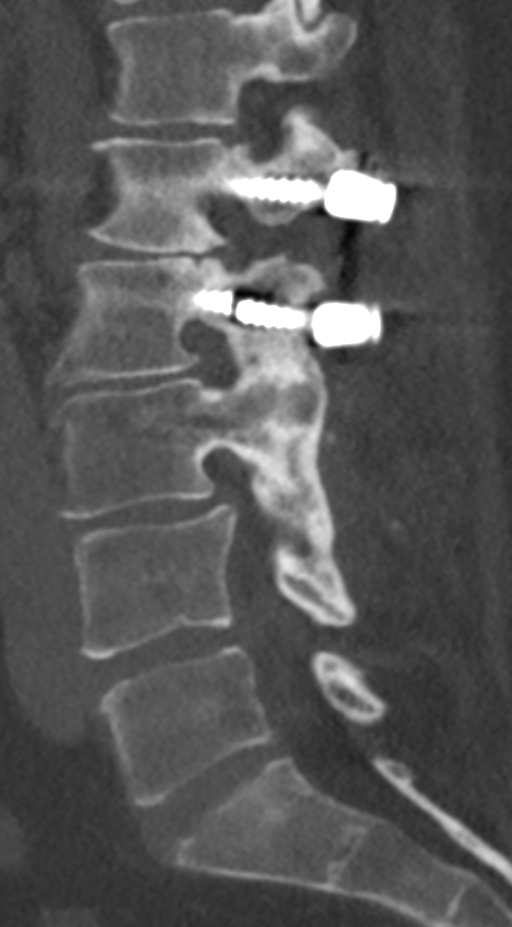
[im 41/71  bone]
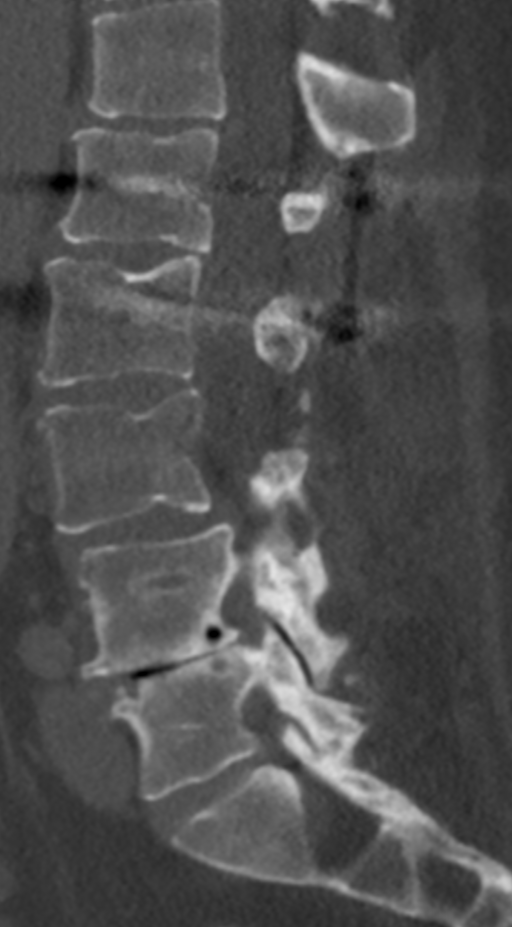
[im 47/71  bone]
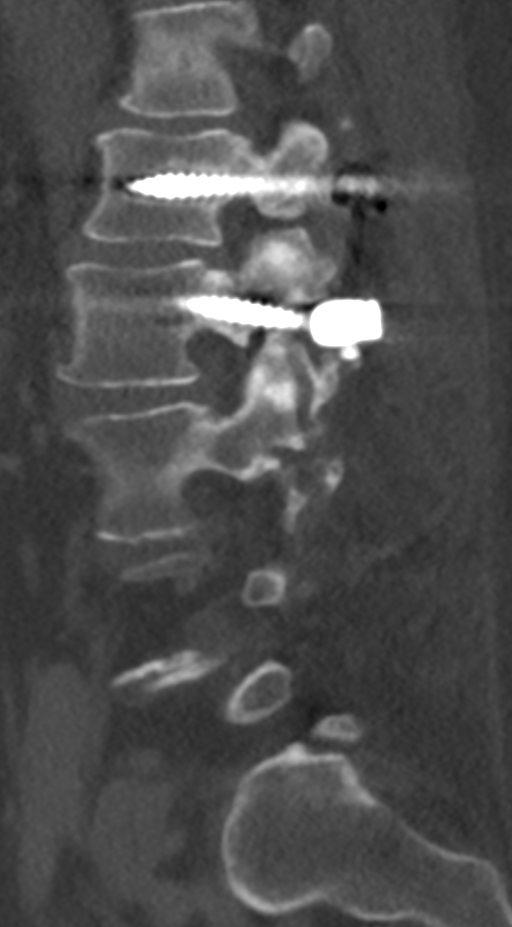

[9 of 33 positions shown; findings below may reference images not displayed]

FINDINGS: Segmentation: Standard. Lowest well-formed disc space labeled the
L5-S1 level.

Alignment: Sigmoid scoliotic curvature of the lumbar spine. Trace
retrolisthesis of T12 on L1 and L1 on L2, with trace anterolisthesis
of L2 on L3. 4 mm anterolisthesis of L3 on L4.

Vertebrae: Vertebral body height maintained without acute or chronic
fracture. Visualized sacrum and pelvis intact. No findings to
suggest osteomyelitis discitis or septic arthritis. SI joints
symmetric and within normal limits. Patient is status post posterior
fusion at L1-2 with bilateral transpedicular screws in place. No
periprosthetic lucency to suggest loosening or infection. The
right-sided transpedicular screw at L2 is fractured (series 13,
image 37). Hardware otherwise intact. Sequelae of prior screw
removal noted at L3 and L4.

Paraspinal and other soft tissues: Chronic postoperative changes
present within the posterior paraspinous soft tissues. No visible
acute paraspinous inflammatory changes. No loculated collections.
Partially visualized abdominal viscera better evaluated on
concomitant CT of the abdomen and pelvis.

Disc levels:

L1-2: Trace retrolisthesis with degenerative intervertebral disc
space narrowing and diffuse disc bulge. Prior posterior
decompression and fusion. No residual spinal stenosis. No more than
mild bilateral L1 foraminal narrowing.

L2-3: Degenerative intervertebral disc space narrowing with diffuse
disc bulge, slightly eccentric to the right. Prior posterior
decompression. Moderate bilateral facet arthrosis. No significant
residual spinal stenosis. Moderate right worse than left L2
foraminal narrowing.

L3-4: 4 mm anterolisthesis. Degenerative intervertebral disc space
narrowing with right eccentric disc bulge. Superimposed right
foraminal to extraforaminal disc protrusion contacts the exiting
right L3 nerve root (series 10, image 59). Moderate bilateral facet
arthrosis. Prior posterior decompression. No definite residual
spinal stenosis. Mild left with moderate right L3 foraminal
narrowing.

L4-5: Degenerative intervertebral disc space narrowing with diffuse
disc bulge and mild disc desiccation. Disc bulging appears to be
asymmetric to the right. Mild reactive endplate spurring. Moderate
facet and ligament flavum hypertrophy. Prior right hemi laminectomy.
Persistent moderate to severe canal with bilateral lateral recess
stenosis. Moderate left worse than right L4 foraminal stenosis.

L5-S1: Shallow broad-based central disc protrusion with mild annular
calcification (series 10, image 90). Bulging disc closely
approximates both of the descending S1 nerve roots without frank
impingement. Moderate bilateral facet arthrosis. No significant
spinal stenosis. Mild-to-moderate bilateral L5 foraminal narrowing.
IMPRESSION: 1. No CT evidence for acute abnormality within the lumbar spine.
2. Postoperative changes from prior posterior decompression and
fusion at L1-2 without residual spinal stenosis. The right
transpedicular screw at L2 is fractured. No periprosthetic lucency
to suggest loosening or infection.
3. Right foraminal to extraforaminal disc protrusion at L3-4,
contacting and potentially affecting the exiting right L3 nerve
root.
4. Multifactorial degenerative changes at L4-5 with resultant
moderate to severe canal with bilateral lateral recess stenosis,
with moderate left worse than right L4 foraminal stenosis.
5. Additional mild-to-moderate foraminal narrowing elsewhere within
the lumbar spine at L2, L3, and L5 as above.

## 2021-06-22 IMAGING — CT CT ABD-PELV W/ CM
2 of 5 series · 16 of 46 positions shown, 18 images · IV contrast (APPLIED)
Comparison: [DATE]

CLINICAL DATA: Abdominal pain and fever. Numbness and tingling to
the right leg since last night. No trauma. History of kidney
infection.

EXAM:
CT ABDOMEN AND PELVIS WITH CONTRAST
TECHNIQUE: Multidetector CT imaging of the abdomen and pelvis was performed
using the standard protocol following bolus administration of
intravenous contrast.
CONTRAST:  85mL OMNIPAQUE IOHEXOL 350 MG/ML SOLN

[Series 2: abd pel w · axial · 0.90mm/px · z∈[+614,+1024]mm · 13 of 94 slices shown, 15 images]
[im 6/94  soft-tissue]
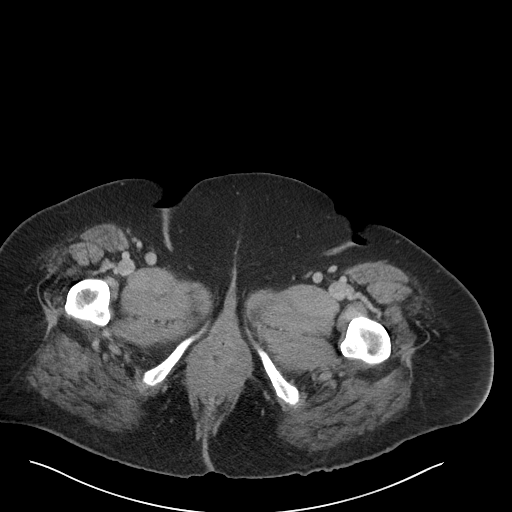
[im 6/94  bone]
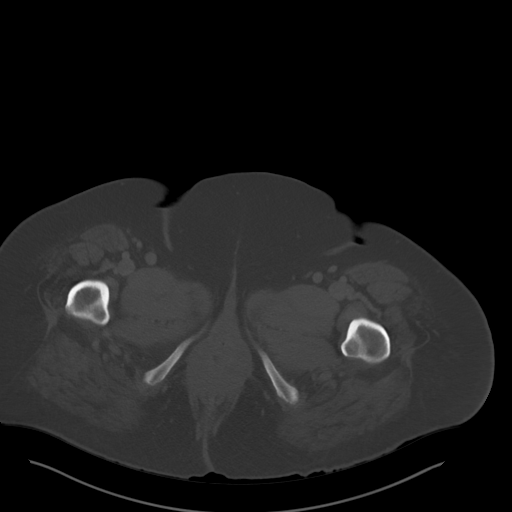
[im 11/94  soft-tissue]
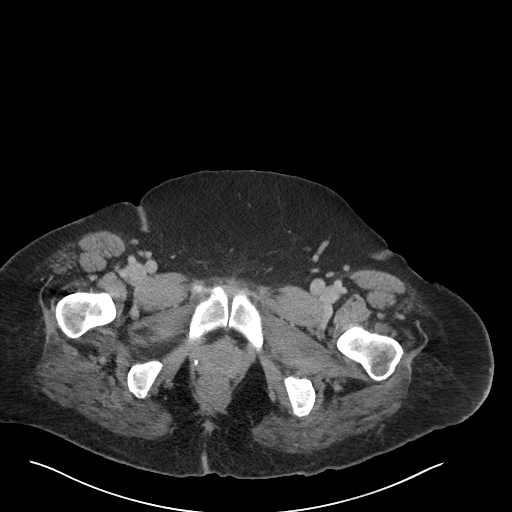
[im 21/94  soft-tissue]
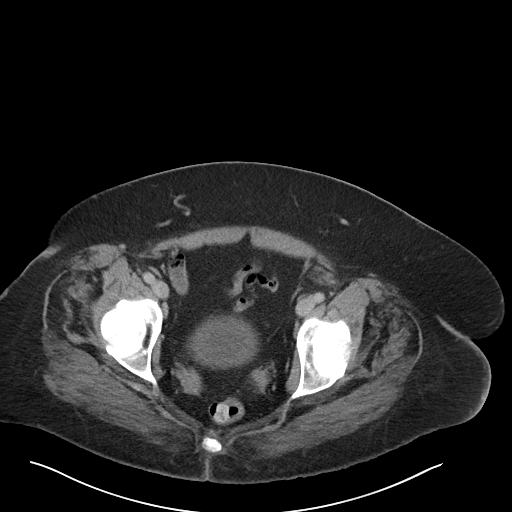
[im 26/94  soft-tissue]
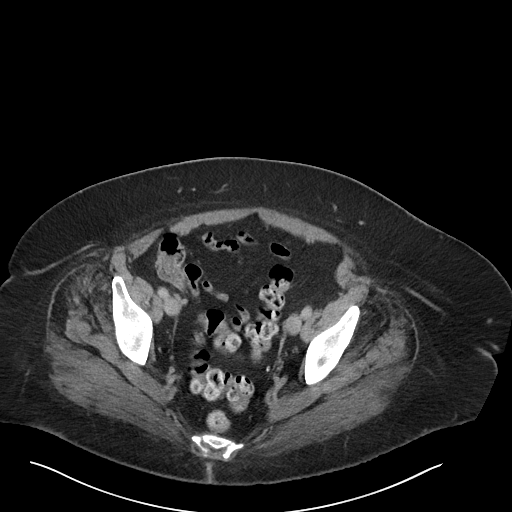
[im 32/94  soft-tissue]
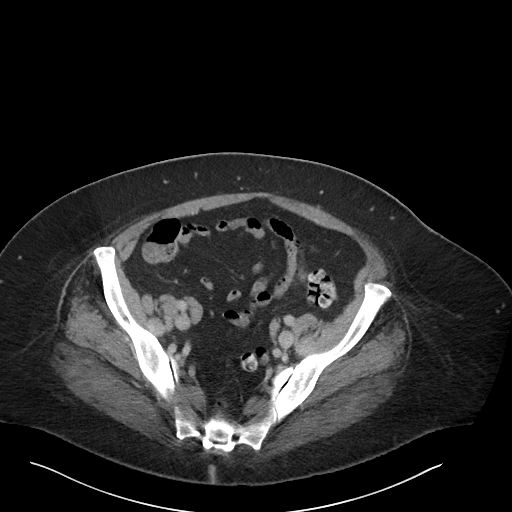
[im 42/94  soft-tissue]
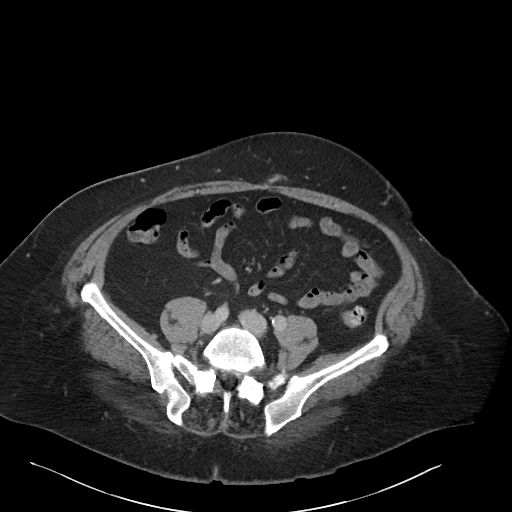
[im 47/94  soft-tissue]
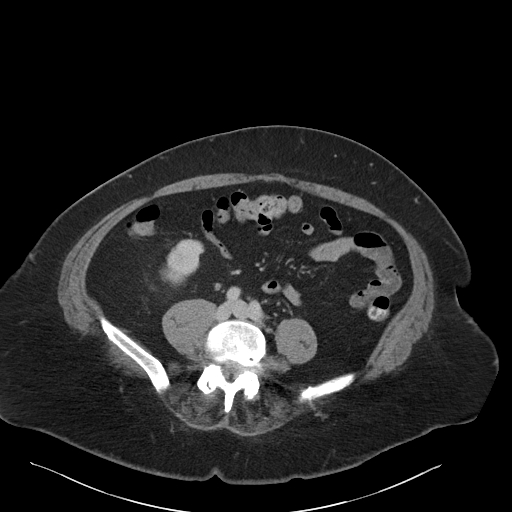
[im 52/94  soft-tissue]
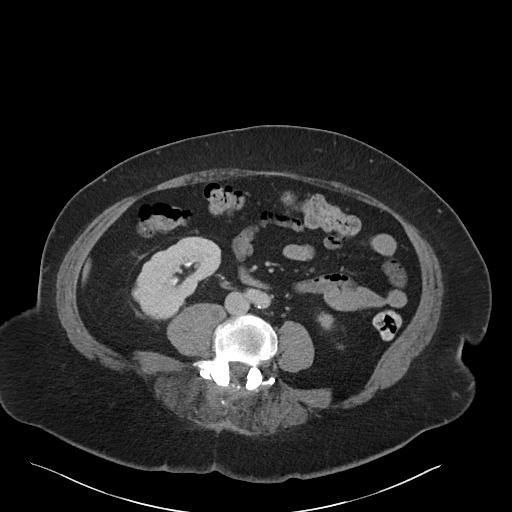
[im 63/94  soft-tissue]
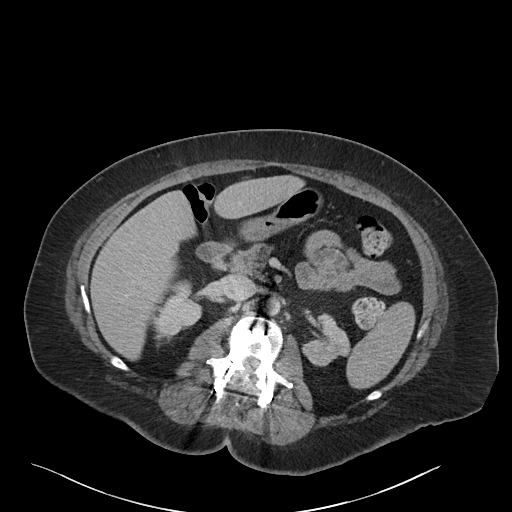
[im 63/94  bone]
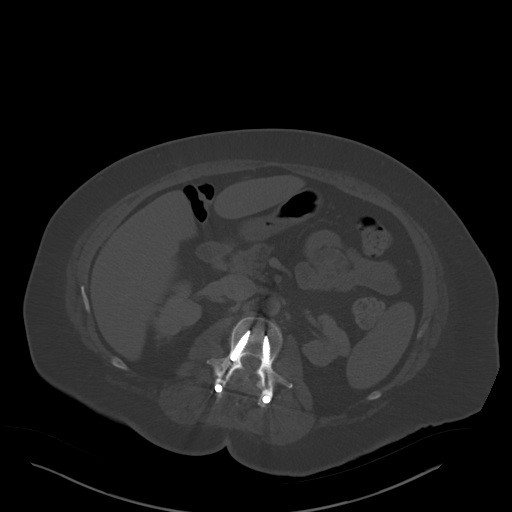
[im 68/94  soft-tissue]
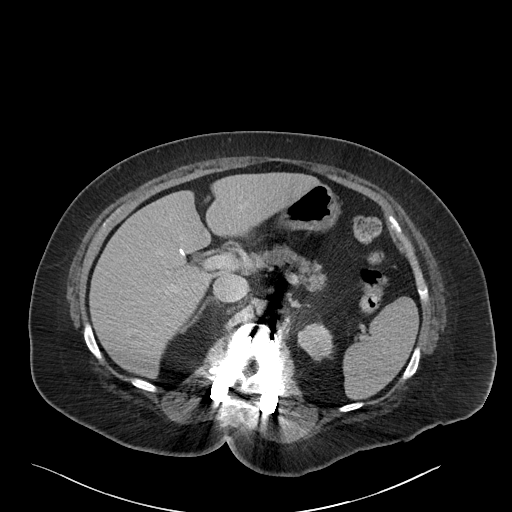
[im 73/94  soft-tissue]
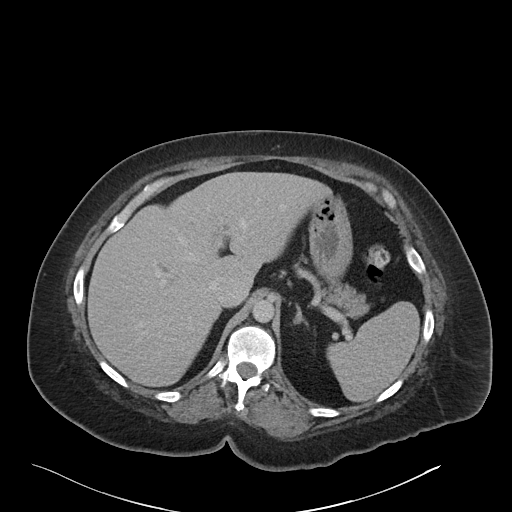
[im 83/94  soft-tissue]
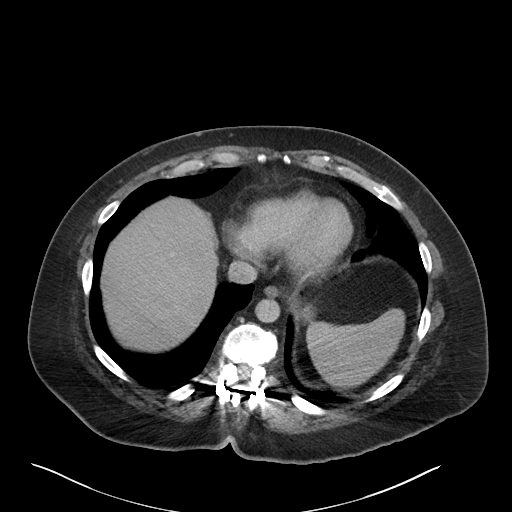
[im 88/94  soft-tissue]
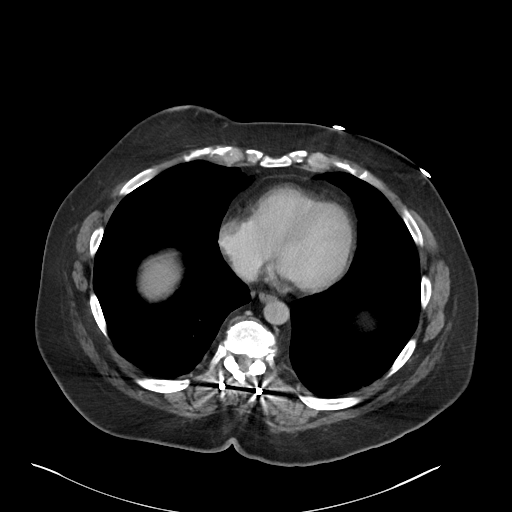

[Series 5: coronal · coronal · 0.95mm/px · 3 of 114 slices shown]
[im 38/114  soft-tissue]
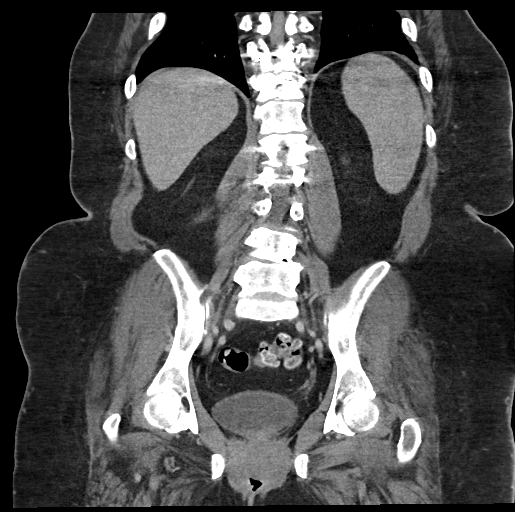
[im 51/114  soft-tissue]
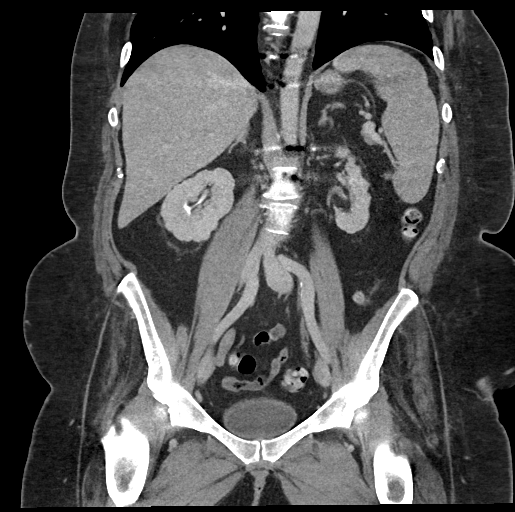
[im 63/114  soft-tissue]
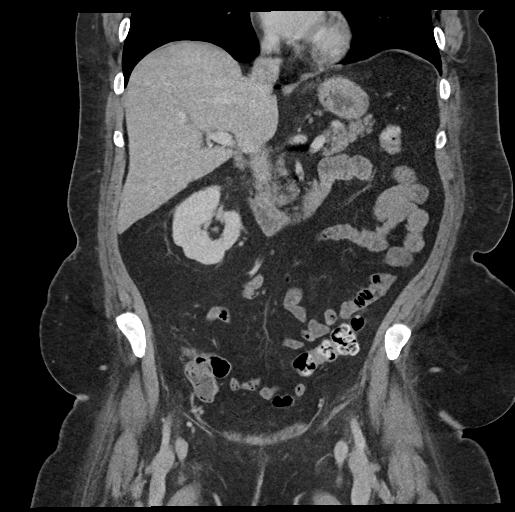

[16 of 46 positions shown; findings below may reference images not displayed]

FINDINGS: Lower chest: Lung bases are clear.

Hepatobiliary: No focal liver abnormality is seen. Status post
cholecystectomy. No biliary dilatation.

Pancreas: Unremarkable. No pancreatic ductal dilatation or
surrounding inflammatory changes.

Spleen: Normal in size without focal abnormality.

Adrenals/Urinary Tract: No adrenal gland nodules. Bilateral renal
parenchymal scarring or lobulation with parenchymal atrophy
involving the left kidney. No solid mass lesion identified. No
hydronephrosis or hydroureter. Bladder is unremarkable.

Stomach/Bowel: Stomach, small bowel, and colon are not abnormally
distended. No wall thickening or inflammatory changes are
appreciated. Appendix is normal.

Vascular/Lymphatic: No significant vascular findings are present. No
enlarged abdominal or pelvic lymph nodes.

Reproductive: Status post hysterectomy. No adnexal masses.

Other: No free air or free fluid in the abdomen. Abdominal wall
musculature appears intact.

Musculoskeletal: Postoperative changes with plate and screw
fixations from T10-T11 and L1-L2. The pedicular screw at L2 on the
right is broken. This may have progressed since the previous study.
Degenerative changes and mild scoliosis of the lumbar spine with
convexity towards the left.
IMPRESSION: 1. Asymmetric atrophy of the left kidney.
2. No acute abnormalities demonstrated in the abdomen or pelvis. No
bowel obstruction or inflammation. No abscess identified.
3. Postoperative changes in the lumbar spine. The right L2 pedicular
screw is broken, possibly progressed since prior study.

## 2021-06-22 MED ORDER — VANCOMYCIN HCL IN DEXTROSE 1-5 GM/200ML-% IV SOLN
1000.0000 mg | Freq: Once | INTRAVENOUS | Status: AC
  Administered 2021-06-23: 1000 mg via INTRAVENOUS
  Filled 2021-06-22: qty 200

## 2021-06-22 MED ORDER — ONDANSETRON HCL 4 MG/2ML IJ SOLN
4.0000 mg | Freq: Once | INTRAMUSCULAR | Status: AC
  Administered 2021-06-22: 4 mg via INTRAVENOUS
  Filled 2021-06-22: qty 2

## 2021-06-22 MED ORDER — METRONIDAZOLE 500 MG/100ML IV SOLN
500.0000 mg | Freq: Once | INTRAVENOUS | Status: AC
  Administered 2021-06-23: 500 mg via INTRAVENOUS
  Filled 2021-06-22 (×2): qty 100

## 2021-06-22 MED ORDER — SODIUM CHLORIDE 0.9 % IV SOLN
1.0000 g | Freq: Once | INTRAVENOUS | Status: AC
Start: 2021-06-22 — End: 2021-06-23
  Administered 2021-06-23: 1 g via INTRAVENOUS
  Filled 2021-06-22: qty 10

## 2021-06-22 MED ORDER — IOHEXOL 350 MG/ML SOLN
100.0000 mL | Freq: Once | INTRAVENOUS | Status: AC | PRN
  Administered 2021-06-22: 85 mL via INTRAVENOUS

## 2021-06-22 MED ORDER — SODIUM CHLORIDE 0.9 % IV SOLN: INTRAVENOUS | Status: DC | PRN

## 2021-06-22 MED ORDER — LACTATED RINGERS IV BOLUS
1000.0000 mL | Freq: Once | INTRAVENOUS | Status: AC
  Administered 2021-06-22: 1000 mL via INTRAVENOUS

## 2021-06-22 NOTE — ED Provider Notes (Addendum)
[facility] Provider Note   CSN: [id number] Arrival date & time: 06/22/21  1858     History Chief Complaint  Patient presents with   Back Pain   Fever   Emesis    Mary Chambers is a 50 y.o. female.   Back Pain Associated symptoms: fever, numbness and weakness   Associated symptoms: no abdominal pain, no chest pain and no dysuria   Fever Associated symptoms: chills, nausea and vomiting   Associated symptoms: no chest pain, no cough, no dysuria, no ear pain, no rash and no sore throat   Emesis Associated symptoms: chills and fever   Associated symptoms: no abdominal pain, no arthralgias, no cough and no sore throat     50 year old female with history of multiple spinal surgeries, HTN, nephrolithiasis, depression, recent diagnosis of UTI treated with Keflex presenting to the ED with multiple complaints. She states she was placed on Keflex for UTI 3 days ago and diagnosed with possible pyelonephritis. She has been taking the ABX. Today, she began to develop lower back and flank pain with some radiation down her right leg. She also endorses new numbness along her right thigh with a new concern for urinary incontinence. She states that she has been wearing diapers today due to a new concern for urinary incontinence. She also endorses fever. She endorses nausea and vomiting and associated chills.  Past Medical History:  Diagnosis Date   Arthritis    Depression    Hypertension    Kidney stone    Obesity     Patient Active Problem List   Diagnosis Date Noted   Gastroesophageal reflux disease 12/15/2020   Screening for colorectal cancer 12/15/2020   Nausea without vomiting 12/15/2020   History of gastric ulcer 12/15/2020   Herniated lumbar disc without myelopathy 10/02/2019   Lumbar spinal stenosis 10/01/2019   Pain in left foot 03/29/2018   Arthritis 02/13/2018   Chronic back pain 02/13/2018   Migraines 02/13/2018   Morbid obesity  with BMI of 40.0-44.9, adult ([employer]) 02/13/2018   Synovial cyst of lumbar spine 10/15/2017   Osteoarthritis of knee 07/13/2017   Lumbar disc disease with radiculopathy 08/01/2016   Fatigue 05/04/2016   Headache 05/04/2016   Numbness of foot 05/04/2016   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   Arthrodesis status 02/02/2016   Chronic pain syndrome 02/02/2016   Elevated white blood cell count 12/01/2015   Lumbar stenosis with neurogenic claudication 05/07/2015   Bilateral leg edema 04/21/2015   Carpal tunnel syndrome of right wrist 12/14/2014   Renal atrophy, left 01/23/2014   Achilles tendonitis 01/16/2014   Right lower quadrant pain 12/18/2013   Hypertension, benign 06/03/2010   Urinary incontinence 06/03/2010   Cervical disc disease 09/09/2009   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     Family History  Problem Relation Age of Onset   Diabetes Mother    Hypertension Mother    Cancer Father    Parkinson's disease Father    Heart disease Father    Heart disease Sister    Heart disease Brother    Colon cancer Neg Hx  Rectal cancer Neg Hx    Stomach cancer Neg Hx     Social History   Tobacco Use   Smoking status: Never   Smokeless tobacco: Never  Vaping Use   Vaping Use: Never used  Substance Use Topics   Alcohol use: No   Drug use: Never    Home Medications Prior to Admission medications   Medication Sig Start Date End Date Taking? Authorizing Provider  Ascorbic Acid (VITAMIN C) 500 MG CAPS Take 500 mg by mouth daily.    [provider]  benzonatate (TESSALON) 100 MG capsule Take 1 capsule (100 mg total) by mouth 3 (three) times daily as needed. 05/03/21   Burnette, Jennifer M, PA-C  cephALEXin (KEFLEX) 500 MG capsule Take  1 capsule (500 mg total) by mouth 3 (three) times daily for 10 days. 06/19/21 06/29/21  Cardama, Pedro Eduardo, MD  cholecalciferol (VITAMIN D3) 25 MCG (1000 UT) tablet Take 1,000 Units by mouth daily.    [provider]  cyclobenzaprine (FLEXERIL) 10 MG tablet Take 1 tablet (10 mg total) by mouth 3 (three) times daily as needed for muscle spasms. 03/30/21   Martin, William C, PA-C  enalapril (VASOTEC) 10 MG tablet Take 10 mg by mouth daily.    [provider]  fluticasone (FLONASE) 50 MCG/ACT nasal spray Place 2 sprays into both nostrils daily. 05/03/21   Martin, William C, PA-C  hydrochlorothiazide (HYDRODIURIL) 25 MG tablet Take 25 mg by mouth daily.    [provider]  Multiple Vitamin (MULTIVITAMIN) capsule Take 1 capsule by mouth daily.     [provider]  naproxen (NAPROSYN) 500 MG tablet Take 1 tablet (500 mg total) by mouth 2 (two) times daily with a meal. 03/30/21   Martin, William C, PA-C  pantoprazole (PROTONIX) 20 MG tablet Take 1 tablet (20 mg total) by mouth daily. 04/22/20   Rees, Elizabeth, MD  POTASSIUM PO Take 1,000 mg by mouth daily.    [provider]  traMADol (ULTRAM) 50 MG tablet Take 50 mg by mouth 3 (three) times daily as needed. 02/02/21   [provider]  diphenhydrAMINE (BENADRYL) 25 mg capsule Take by mouth. 03/10/16 06/21/19  [provider]  LYRICA 50 MG capsule Take 50 mg by mouth 3 (three) times daily. 04/15/18 06/07/19  [provider]    Allergies    Baclofen, Doxycycline, Percocet [oxycodone-acetaminophen], Septra [sulfamethoxazole-trimethoprim], and Gabapentin  Review of Systems   Review of Systems  Constitutional:  Positive for chills and fever.  HENT:  Negative for ear pain and sore throat.   Eyes:  Negative for pain and visual disturbance.  Respiratory:  Negative for cough and shortness of breath.   Cardiovascular:  Negative for chest pain and palpitations.  Gastrointestinal:  Positive for  nausea and vomiting. Negative for abdominal pain.  Genitourinary:  Positive for flank pain. Negative for dysuria, frequency and hematuria.  Musculoskeletal:  Positive for back pain. Negative for arthralgias.  Skin:  Negative for color change and rash.  Neurological:  Positive for weakness and numbness. Negative for seizures and syncope.  All other systems reviewed and are negative.  Physical Exam Updated Vital Signs BP (!) 145/78 (BP Location: Right Arm)   Pulse (!) 104   Temp 99.5 F (37.5 C) (Oral)   Resp 16   Ht 5' 7" (1.702 m)   Wt 120.7 kg   SpO2 97%   BMI 41.66 kg/m   Physical Exam Vitals and nursing note reviewed. Exam conducted with a   chaperone present.  Constitutional:      General: She is not in acute distress.    Appearance: She is well-developed. She is obese.  HENT:     Head: Normocephalic and atraumatic.  Eyes:     Conjunctiva/sclera: Conjunctivae normal.  Cardiovascular:     Rate and Rhythm: Normal rate and regular rhythm.     Heart sounds: No murmur heard. Pulmonary:     Effort: Pulmonary effort is normal. No respiratory distress.     Breath sounds: Normal breath sounds.  Abdominal:     Palpations: Abdomen is soft.     Tenderness: There is no abdominal tenderness.  Genitourinary:    Comments: Intact rectal tone Musculoskeletal:     Cervical back: Neck supple.     Comments: Right flank MSK TTP, mild midline lower lumbar TTP.   Skin:    General: Skin is warm and dry.  Neurological:     Mental Status: She is alert.     Comments: MENTAL STATUS EXAM:    Orientation: Alert and oriented to person, place and time.  Memory: Cooperative, follows commands well.  Language: Speech is clear and language is normal.   CRANIAL NERVES:    CN 2 (Optic): Visual fields intact to confrontation.  CN 3,4,6 (EOM): Pupils equal and reactive to light. Full extraocular eye movement without nystagmus.  CN 5 (Trigeminal): Facial sensation is normal, no weakness of masticatory  muscles.  CN 7 (Facial): No facial weakness or asymmetry.  CN 8 (Auditory): Auditory acuity grossly normal.  CN 9,10 (Glossophar): The uvula is midline, the palate elevates symmetrically.  CN 11 (spinal access): Normal sternocleidomastoid and trapezius strength.  CN 12 (Hypoglossal): The tongue is midline. No atrophy or fasciculations..   MOTOR:  Muscle Strength: 5/5RUE, 5/5LUE, 4/5RLE, 5/5LLE.   COORDINATION:   No tremor.   SENSATION:   Intact to light touch all four extremities, decreased sensation along the right thigh.  GAIT: Gait not assessed     ED Results / Procedures / Treatments   Labs (all labs ordered are listed, but only abnormal results are displayed) Labs Reviewed  RESP PANEL BY RT-PCR (FLU A&B, COVID) ARPGX2 - Abnormal; Notable for the following components:      Result Value   SARS Coronavirus 2 by RT PCR POSITIVE (*)    All other components within normal limits  CBC WITH DIFFERENTIAL/PLATELET - Abnormal; Notable for the following components:   WBC 11.2 (*)    Monocytes Absolute 1.2 (*)    All other components within normal limits  SEDIMENTATION RATE - Abnormal; Notable for the following components:   Sed Rate 96 (*)    All other components within normal limits  CULTURE, BLOOD (ROUTINE X 2)  CULTURE, BLOOD (ROUTINE X 2)  BASIC METABOLIC PANEL  C-REACTIVE PROTEIN    EKG None  Radiology CT ABDOMEN PELVIS W CONTRAST  Result Date: 06/23/2021 CLINICAL DATA:  Abdominal pain and fever. Numbness and tingling to the right leg since last night. No trauma. History of kidney infection. EXAM: CT ABDOMEN AND PELVIS WITH CONTRAST TECHNIQUE: Multidetector CT imaging of the abdomen and pelvis was performed using the standard protocol following bolus administration of intravenous contrast. CONTRAST:  85mL OMNIPAQUE IOHEXOL 350 MG/ML SOLN COMPARISON:  06/19/2021 FINDINGS: Lower chest: Lung bases are clear. Hepatobiliary: No focal liver abnormality is seen. Status post  cholecystectomy. No biliary dilatation. Pancreas: Unremarkable. No pancreatic ductal dilatation or surrounding inflammatory changes. Spleen: Normal in size without focal abnormality. Adrenals/Urinary Tract: No adrenal   gland nodules. Bilateral renal parenchymal scarring or lobulation with parenchymal atrophy involving the left kidney. No solid mass lesion identified. No hydronephrosis or hydroureter. Bladder is unremarkable. Stomach/Bowel: Stomach, small bowel, and colon are not abnormally distended. No wall thickening or inflammatory changes are appreciated. Appendix is normal. Vascular/Lymphatic: No significant vascular findings are present. No enlarged abdominal or pelvic lymph nodes. Reproductive: Status post hysterectomy. No adnexal masses. Other: No free air or free fluid in the abdomen. Abdominal wall musculature appears intact. Musculoskeletal: Postoperative changes with plate and screw fixations from T10-T11 and L1-L2. The pedicular screw at L2 on the right is broken. This may have progressed since the previous study. Degenerative changes and mild scoliosis of the lumbar spine with convexity towards the left. IMPRESSION: 1. Asymmetric atrophy of the left kidney. 2. No acute abnormalities demonstrated in the abdomen or pelvis. No bowel obstruction or inflammation. No abscess identified. 3. Postoperative changes in the lumbar spine. The right L2 pedicular screw is broken, possibly progressed since prior study. Electronically Signed   By: William  Stevens M.D.   On: 06/23/2021 00:25   CT L-SPINE NO CHARGE  Result Date: 06/23/2021 CLINICAL DATA:  Initial evaluation for low back pain with right lower extremity numbness and tingling. Fevers. EXAM: CT LUMBAR SPINE WITHOUT CONTRAST TECHNIQUE: Multidetector CT imaging of the lumbar spine was performed without intravenous contrast administration. Multiplanar CT image reconstructions were also generated. COMPARISON:  MRI from 10/01/2019. FINDINGS: Segmentation:  Standard. Lowest well-formed disc space labeled the L5-S1 level. Alignment: Sigmoid scoliotic curvature of the lumbar spine. Trace retrolisthesis of T12 on L1 and L1 on L2, with trace anterolisthesis of L2 on L3. 4 mm anterolisthesis of L3 on L4. Vertebrae: Vertebral body height maintained without acute or chronic fracture. Visualized sacrum and pelvis intact. No findings to suggest osteomyelitis discitis or septic arthritis. SI joints symmetric and within normal limits. Patient is status post posterior fusion at L1-2 with bilateral transpedicular screws in place. No periprosthetic lucency to suggest loosening or infection. The right-sided transpedicular screw at L2 is fractured (series 13, image 37). Hardware otherwise intact. Sequelae of prior screw removal noted at L3 and L4. Paraspinal and other soft tissues: Chronic postoperative changes present within the posterior paraspinous soft tissues. No visible acute paraspinous inflammatory changes. No loculated collections. Partially visualized abdominal viscera better evaluated on concomitant CT of the abdomen and pelvis. Disc levels: L1-2: Trace retrolisthesis with degenerative intervertebral disc space narrowing and diffuse disc bulge. Prior posterior decompression and fusion. No residual spinal stenosis. No more than mild bilateral L1 foraminal narrowing. L2-3: Degenerative intervertebral disc space narrowing with diffuse disc bulge, slightly eccentric to the right. Prior posterior decompression. Moderate bilateral facet arthrosis. No significant residual spinal stenosis. Moderate right worse than left L2 foraminal narrowing. L3-4: 4 mm anterolisthesis. Degenerative intervertebral disc space narrowing with right eccentric disc bulge. Superimposed right foraminal to extraforaminal disc protrusion contacts the exiting right L3 nerve root (series 10, image 59). Moderate bilateral facet arthrosis. Prior posterior decompression. No definite residual spinal stenosis.  Mild left with moderate right L3 foraminal narrowing. L4-5: Degenerative intervertebral disc space narrowing with diffuse disc bulge and mild disc desiccation. Disc bulging appears to be asymmetric to the right. Mild reactive endplate spurring. Moderate facet and ligament flavum hypertrophy. Prior right hemi laminectomy. Persistent moderate to severe canal with bilateral lateral recess stenosis. Moderate left worse than right L4 foraminal stenosis. L5-S1: Shallow broad-based central disc protrusion with mild annular calcification (series 10, image 90). Bulging disc closely approximates both   of the descending S1 nerve roots without frank impingement. Moderate bilateral facet arthrosis. No significant spinal stenosis. Mild-to-moderate bilateral L5 foraminal narrowing. IMPRESSION: 1. No CT evidence for acute abnormality within the lumbar spine. 2. Postoperative changes from prior posterior decompression and fusion at L1-2 without residual spinal stenosis. The right transpedicular screw at L2 is fractured. No periprosthetic lucency to suggest loosening or infection. 3. Right foraminal to extraforaminal disc protrusion at L3-4, contacting and potentially affecting the exiting right L3 nerve root. 4. Multifactorial degenerative changes at L4-5 with resultant moderate to severe canal with bilateral lateral recess stenosis, with moderate left worse than right L4 foraminal stenosis. 5. Additional mild-to-moderate foraminal narrowing elsewhere within the lumbar spine at L2, L3, and L5 as above. Electronically Signed   By: Benjamin  McClintock M.D.   On: 06/23/2021 01:02    Procedures .Critical Care Performed by: Lawsing, James, MD Authorized by: Lawsing, James, MD   Critical care provider statement:    Critical care time (minutes):  44   Critical care was necessary to treat or prevent imminent or life-threatening deterioration of the following conditions:  CNS failure or compromise   Critical care was time spent  personally by me on the following activities:  Discussions with consultants, evaluation of patient's response to treatment, examination of patient, ordering and performing treatments and interventions, ordering and review of laboratory studies, ordering and review of radiographic studies, pulse oximetry, re-evaluation of patient's condition, obtaining history from patient or surrogate and review of old charts   Care discussed with: accepting provider at another facility     Medications Ordered in ED Medications  vancomycin (VANCOCIN) IVPB 1000 mg/200 mL premix (has no administration in time range)  metroNIDAZOLE (FLAGYL) IVPB 500 mg (has no administration in time range)  0.9 %  sodium chloride infusion ( Intravenous New Bag/Given 06/22/21 2342)  lactated ringers bolus 1,000 mL (0 mLs Intravenous Stopped 06/22/21 2337)  ondansetron (ZOFRAN) injection 4 mg (4 mg Intravenous Given 06/22/21 2252)  cefTRIAXone (ROCEPHIN) 1 g in sodium chloride 0.9 % 100 mL IVPB (0 g Intravenous Stopped 06/23/21 0109)  iohexol (OMNIPAQUE) 350 MG/ML injection 100 mL (85 mLs Intravenous Contrast Given 06/22/21 2354)    ED Course  I have reviewed the triage vital signs and the nursing notes.  Pertinent labs & imaging results that were available during my care of the patient were reviewed by me and considered in my medical decision making (see chart for details).    MDM Rules/Calculators/A&P                           49 year old female presenting with back pain, fever, flank pain, new neurologic deficits as above. On exam, she has new weakness in her RLE with decreased sensation in the right thigh. She endorses NEW urinary incontinence and has been febrile at home. She also endorses chills and has failed been on Keflex for the past few days for UTI/early pyelonephritis.   Given her surgical history and neurologic deficits, Ddx includes hardware malfunction, epidural abscess, discitis, ruptured disc. Additionally,  patient's fever may be a separate complaint and due to possible worsening pyelonephritis vs viral infection.   Workup initiated to include CT abdomen pelvis with lumbar reformats, screening labs to include COVID PCR.   Given the patient's fever, back pain, and new neurologic deficits, I do think she needs an MRI emergently to rule out epidural compression syndrome. She was started on IV ABX for   broad spectrum coverage. Dr. Hong at the [facility] accepts her in transfer as we do not have MRI capabilities here.  Workup to date: CT Abdomen pelvis and L spine with the following results: Degenerative changes, postoperative changes, and a peduncular screw that is fractured at L2 which the patient has previously addressed with her neurosurgeon. Covid 19 testing did result positive. Leukocytosis to 11.2, ESR 96, BC x2 pending. Fever may be explained by pyelonephritis and/or COVID, but given fever and new neuro symptoms, I believe MRI imaging is warranted to rule out spinal infection. Patient overall is hemodynamically stable for transfer.   Final Clinical Impression(s) / ED Diagnoses Final diagnoses:  Back pain  Fever    Rx / DC Orders ED Discharge Orders     None        Lawsing, James, MD 06/23/21 0211    Lawsing, James, MD 06/24/21 0014  

## 2021-06-22 NOTE — ED Triage Notes (Signed)
Per EMS-right hip and back pain-history of herniated disc-has had surgery

## 2021-06-22 NOTE — ED Triage Notes (Signed)
Pt was in ED [facility] and decided to come over here due to long wait.  Pt reports low back pain associated with  tingling and numbness to  right leg since last night. Denies trauma  Also endorses on and off fever and vomiting. Pt was here on 09/18 and was dx with kidney infection.

## 2021-06-22 NOTE — ED Provider Notes (Signed)
Emergency Medicine Provider Triage Evaluation Note  Mary Chambers , a 50 y.o. female  was evaluated in triage.  Pt complains of right low back pain.  Symptoms started about 3 days ago.  Denies any new falls or injuries.  She was initially evaluated in the emergency department for this and diagnosed with possible pyelonephritis.  States that today her pain began to worsen.  States it radiates down the right posterior leg.  Reports associated tingling and numbness in the right thigh.  No saddle anesthesia or bowel/bladder incontinence.  She reports a history of lumbar surgery and is followed by Dr. Vertell Limber with neurosurgery.  She states that she reached out to his office 2 days ago but has not heard back.  Physical Exam  BP (!) 148/84   Pulse (!) 116   Temp 99.3 F (37.4 C) (Oral)   Resp 20   SpO2 90%  Gen:   Awake, appears to be in pain Resp:  Normal effort  MSK:   Moderate TTP noted to the right low back and musculature along the right buttock. Other:    Medical Decision Making  Medically screening exam initiated at 4:30 PM.  Appropriate orders placed.  Mary Chambers was informed that the remainder of the evaluation will be completed by another provider, this initial triage assessment does not replace that evaluation, and the importance of remaining in the ED until their evaluation is complete.   Mary Sexton, PA-C 06/22/21 1631    Tegeler, Mary Allegra, MD 06/22/21 [phone]

## 2021-06-22 NOTE — ED Notes (Signed)
Called [facility] to transport patient to [facility]

## 2021-06-23 ENCOUNTER — Inpatient Hospital Stay (HOSPITAL_COMMUNITY): Payer: 59

## 2021-06-23 ENCOUNTER — Emergency Department (HOSPITAL_COMMUNITY): Payer: 59

## 2021-06-23 DIAGNOSIS — I330 Acute and subacute infective endocarditis: Secondary | ICD-10-CM | POA: Diagnosis present

## 2021-06-23 DIAGNOSIS — L0291 Cutaneous abscess, unspecified: Secondary | ICD-10-CM | POA: Clinically undetermined

## 2021-06-23 DIAGNOSIS — Z79899 Other long term (current) drug therapy: Secondary | ICD-10-CM | POA: Diagnosis not present

## 2021-06-23 DIAGNOSIS — M4656 Other infective spondylopathies, lumbar region: Secondary | ICD-10-CM | POA: Diagnosis present

## 2021-06-23 DIAGNOSIS — G4730 Sleep apnea, unspecified: Secondary | ICD-10-CM | POA: Diagnosis present

## 2021-06-23 DIAGNOSIS — I350 Nonrheumatic aortic (valve) stenosis: Secondary | ICD-10-CM | POA: Diagnosis not present

## 2021-06-23 DIAGNOSIS — A419 Sepsis, unspecified organism: Secondary | ICD-10-CM | POA: Diagnosis present

## 2021-06-23 DIAGNOSIS — M1990 Unspecified osteoarthritis, unspecified site: Secondary | ICD-10-CM | POA: Diagnosis present

## 2021-06-23 DIAGNOSIS — Z6841 Body Mass Index (BMI) 40.0 and over, adult: Secondary | ICD-10-CM | POA: Diagnosis not present

## 2021-06-23 DIAGNOSIS — Q231 Congenital insufficiency of aortic valve: Secondary | ICD-10-CM | POA: Diagnosis not present

## 2021-06-23 DIAGNOSIS — R32 Unspecified urinary incontinence: Secondary | ICD-10-CM | POA: Diagnosis present

## 2021-06-23 DIAGNOSIS — K219 Gastro-esophageal reflux disease without esophagitis: Secondary | ICD-10-CM | POA: Diagnosis present

## 2021-06-23 DIAGNOSIS — M4620 Osteomyelitis of vertebra, site unspecified: Secondary | ICD-10-CM | POA: Diagnosis not present

## 2021-06-23 DIAGNOSIS — Z8249 Family history of ischemic heart disease and other diseases of the circulatory system: Secondary | ICD-10-CM | POA: Diagnosis not present

## 2021-06-23 DIAGNOSIS — F32A Depression, unspecified: Secondary | ICD-10-CM | POA: Diagnosis present

## 2021-06-23 DIAGNOSIS — I10 Essential (primary) hypertension: Secondary | ICD-10-CM | POA: Diagnosis not present

## 2021-06-23 DIAGNOSIS — E785 Hyperlipidemia, unspecified: Secondary | ICD-10-CM | POA: Diagnosis present

## 2021-06-23 DIAGNOSIS — N12 Tubulo-interstitial nephritis, not specified as acute or chronic: Secondary | ICD-10-CM | POA: Diagnosis present

## 2021-06-23 DIAGNOSIS — R3989 Other symptoms and signs involving the genitourinary system: Secondary | ICD-10-CM | POA: Diagnosis present

## 2021-06-23 DIAGNOSIS — R0602 Shortness of breath: Secondary | ICD-10-CM | POA: Diagnosis present

## 2021-06-23 DIAGNOSIS — R6520 Severe sepsis without septic shock: Secondary | ICD-10-CM | POA: Diagnosis not present

## 2021-06-23 DIAGNOSIS — R0902 Hypoxemia: Secondary | ICD-10-CM | POA: Diagnosis present

## 2021-06-23 DIAGNOSIS — U071 COVID-19: Secondary | ICD-10-CM | POA: Diagnosis present

## 2021-06-23 DIAGNOSIS — I358 Other nonrheumatic aortic valve disorders: Secondary | ICD-10-CM | POA: Diagnosis not present

## 2021-06-23 DIAGNOSIS — G4700 Insomnia, unspecified: Secondary | ICD-10-CM | POA: Diagnosis present

## 2021-06-23 DIAGNOSIS — R7401 Elevation of levels of liver transaminase levels: Secondary | ICD-10-CM

## 2021-06-23 DIAGNOSIS — G894 Chronic pain syndrome: Secondary | ICD-10-CM | POA: Diagnosis present

## 2021-06-23 DIAGNOSIS — G061 Intraspinal abscess and granuloma: Secondary | ICD-10-CM | POA: Diagnosis present

## 2021-06-23 DIAGNOSIS — A4181 Sepsis due to Enterococcus: Secondary | ICD-10-CM | POA: Diagnosis present

## 2021-06-23 DIAGNOSIS — I339 Acute and subacute endocarditis, unspecified: Secondary | ICD-10-CM | POA: Diagnosis not present

## 2021-06-23 DIAGNOSIS — K06010 Localized gingival recession, unspecified: Secondary | ICD-10-CM | POA: Diagnosis present

## 2021-06-23 DIAGNOSIS — I351 Nonrheumatic aortic (valve) insufficiency: Secondary | ICD-10-CM | POA: Diagnosis not present

## 2021-06-23 DIAGNOSIS — F419 Anxiety disorder, unspecified: Secondary | ICD-10-CM | POA: Diagnosis present

## 2021-06-23 DIAGNOSIS — I119 Hypertensive heart disease without heart failure: Secondary | ICD-10-CM | POA: Diagnosis present

## 2021-06-23 DIAGNOSIS — R7881 Bacteremia: Secondary | ICD-10-CM | POA: Diagnosis not present

## 2021-06-23 DIAGNOSIS — M009 Pyogenic arthritis, unspecified: Secondary | ICD-10-CM | POA: Diagnosis present

## 2021-06-23 DIAGNOSIS — M4650 Other infective spondylopathies, site unspecified: Secondary | ICD-10-CM | POA: Diagnosis not present

## 2021-06-23 HISTORY — PX: IR FLUORO GUIDED NEEDLE PLC ASPIRATION/INJECTION LOC: IMG2395

## 2021-06-23 HISTORY — DX: Elevation of levels of liver transaminase levels: R74.01

## 2021-06-23 HISTORY — DX: Cutaneous abscess, unspecified: L02.91

## 2021-06-23 HISTORY — DX: COVID-19: U07.1

## 2021-06-23 LAB — BLOOD CULTURE ID PANEL (REFLEXED) - BCID2

## 2021-06-23 LAB — HEPATIC FUNCTION PANEL
ALT: 50 U/L — ABNORMAL HIGH (ref 0–44)
AST: 21 U/L (ref 15–41)
Albumin: 2.5 g/dL — ABNORMAL LOW (ref 3.5–5.0)
Alkaline Phosphatase: 128 U/L — ABNORMAL HIGH (ref 38–126)
Bilirubin, Direct: 0.1 mg/dL (ref 0.0–0.2)
Indirect Bilirubin: 0.6 mg/dL (ref 0.3–0.9)
Total Bilirubin: 0.7 mg/dL (ref 0.3–1.2)
Total Protein: 6.6 g/dL (ref 6.5–8.1)

## 2021-06-23 LAB — HEPATITIS PANEL, ACUTE
HCV Ab: NONREACTIVE
Hep A IgM: NONREACTIVE
Hep B C IgM: NONREACTIVE
Hepatitis B Surface Ag: NONREACTIVE

## 2021-06-23 LAB — RESP PANEL BY RT-PCR (FLU A&B, COVID) ARPGX2
Influenza A by PCR: NEGATIVE
Influenza B by PCR: NEGATIVE
SARS Coronavirus 2 by RT PCR: POSITIVE — AB

## 2021-06-23 LAB — HIV ANTIBODY (ROUTINE TESTING W REFLEX): HIV Screen 4th Generation wRfx: NONREACTIVE

## 2021-06-23 LAB — RAPID URINE DRUG SCREEN, HOSP PERFORMED
Amphetamines: NOT DETECTED
Barbiturates: NOT DETECTED
Benzodiazepines: NOT DETECTED
Cocaine: NOT DETECTED
Opiates: NOT DETECTED
Tetrahydrocannabinol: NOT DETECTED

## 2021-06-23 LAB — LACTIC ACID, PLASMA: Lactic Acid, Venous: 0.6 mmol/L (ref 0.5–1.9)

## 2021-06-23 LAB — C-REACTIVE PROTEIN: CRP: 35.8 mg/dL — ABNORMAL HIGH (ref <1.0)

## 2021-06-23 LAB — SEDIMENTATION RATE: Sed Rate: 96 mm/hr — ABNORMAL HIGH (ref 0–22)

## 2021-06-23 IMAGING — DX DG CHEST 1V PORT
1 series · 2 of 2 positions shown · non-contrast
Comparison: Chest radiograph [DATE]

CLINICAL DATA: Shortness of breath

EXAM:
PORTABLE CHEST 1 VIEW

[Series 1: chest · 0.14mm/px · 2 of 2 slices shown]
[im 1/2]
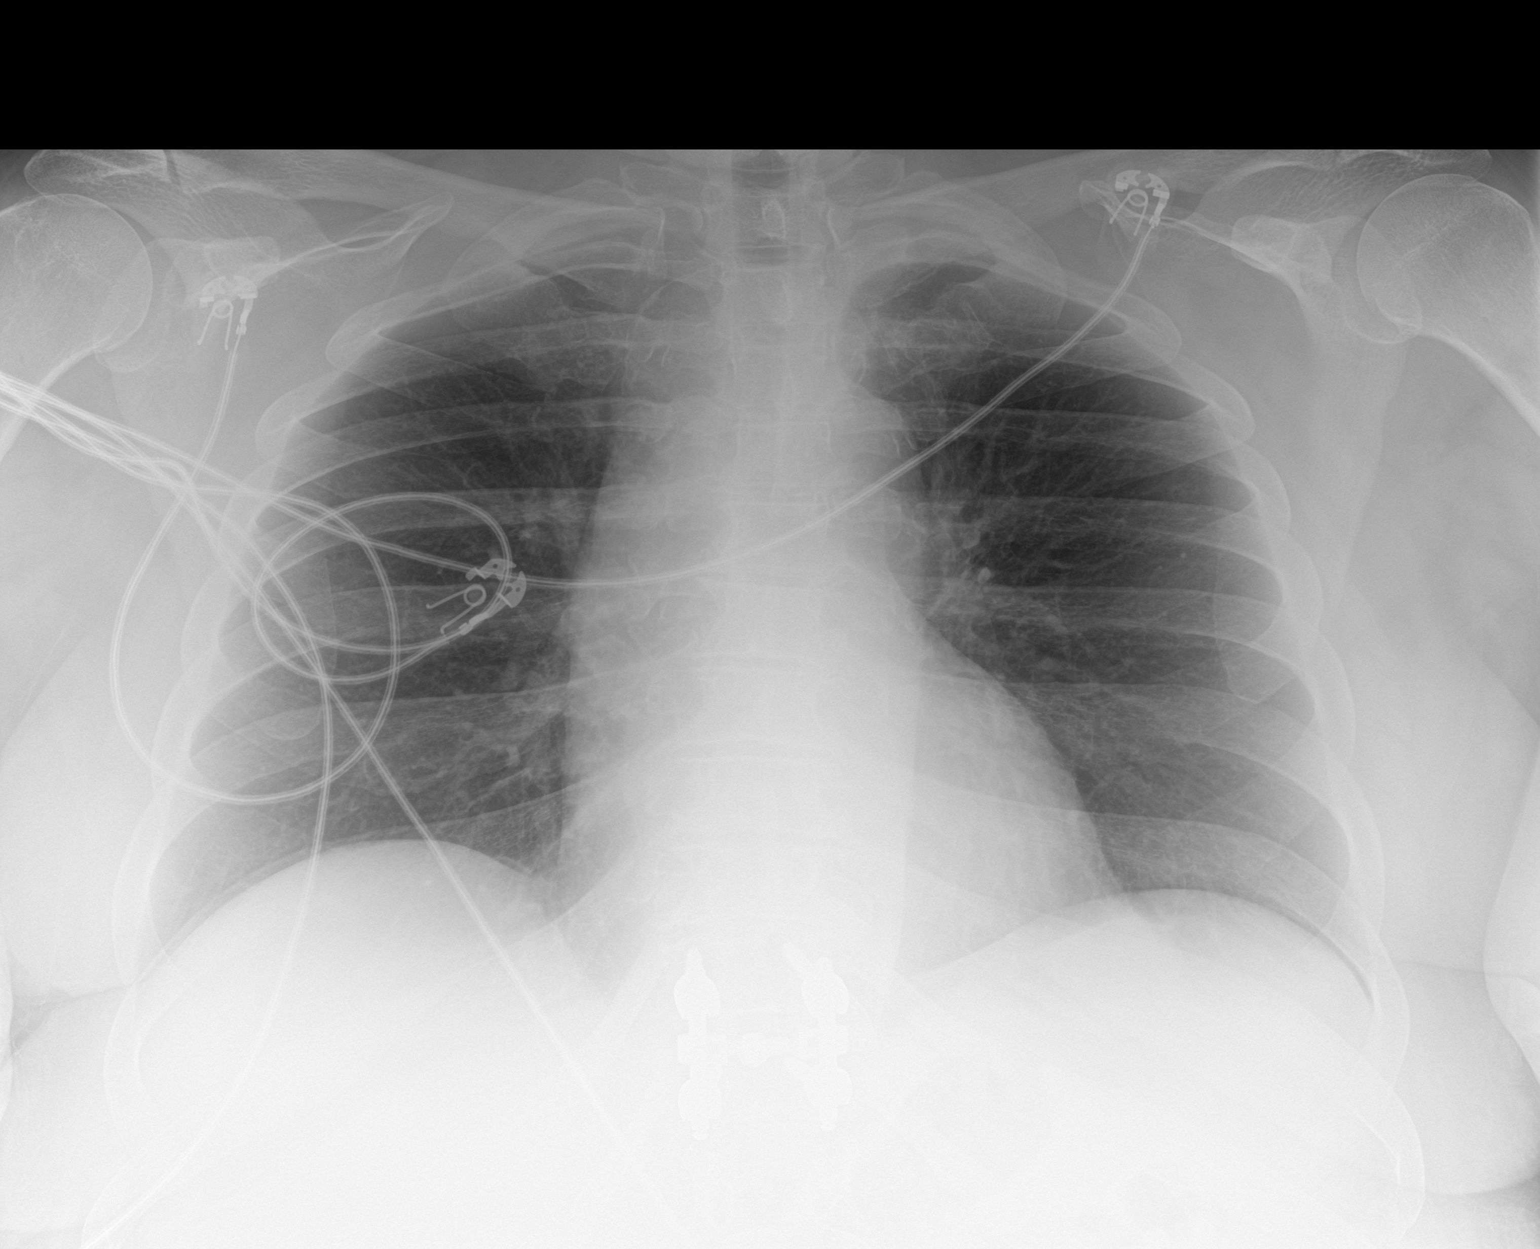
[im 2/2]
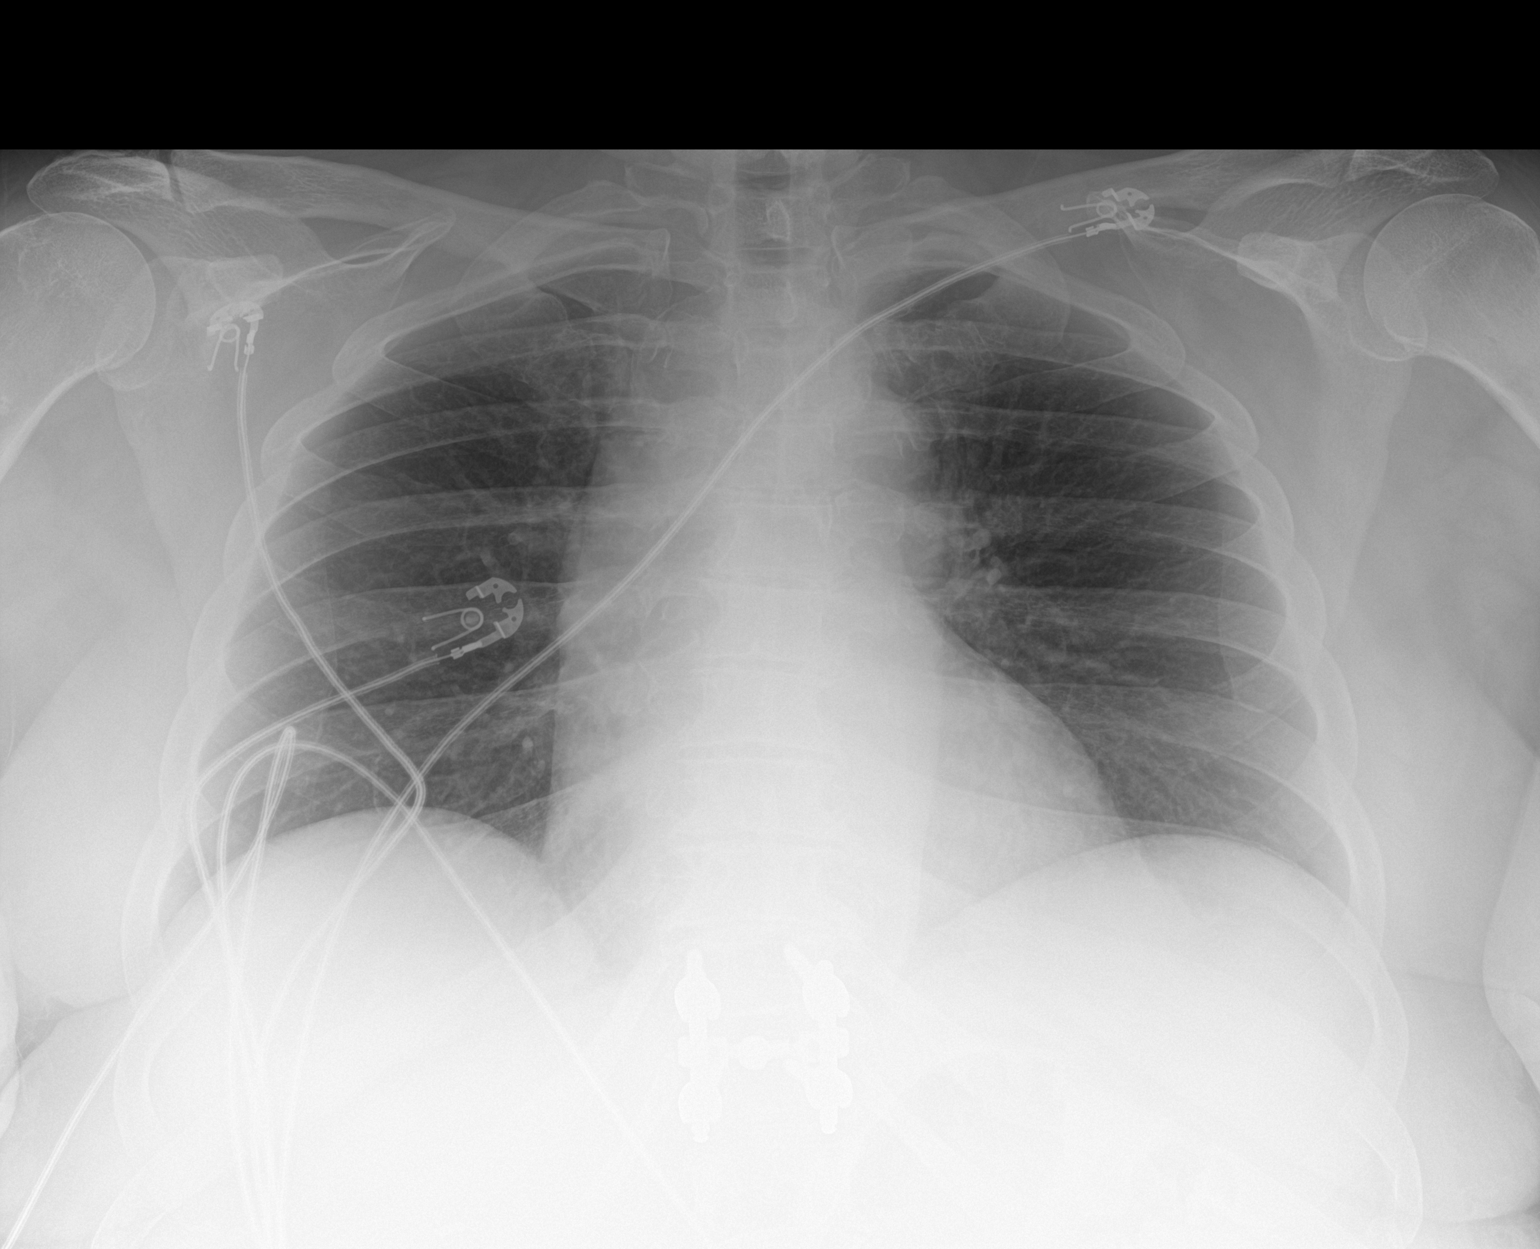

[2 of 2 positions shown; findings below may reference images not displayed]

FINDINGS: The cardiomediastinal silhouette is normal.

Lung volumes are mildly low. There is no focal consolidation or
pulmonary edema. There is no pleural effusion or pneumothorax.

There is no acute osseous abnormality. Spinal fusion hardware is
noted.
IMPRESSION: Low lung volumes. Otherwise, no radiographic evidence of acute
cardiopulmonary process.

## 2021-06-23 IMAGING — MR MR LUMBAR SPINE WO/W CM
4 of 7 series · 22 of 48 positions shown · IV contrast (gadavist)
Comparison: Prior CT from [DATE].

CLINICAL DATA: Initial evaluation for acute lower back pain,
infection suspected.

EXAM:
MRI THORACIC AND LUMBAR SPINE WITHOUT AND WITH CONTRAST
TECHNIQUE: Multiplanar and multiecho pulse sequences of the thoracic and lumbar
spine were obtained without and with intravenous contrast.
CONTRAST:  8mL GADAVIST GADOBUTROL 1 MMOL/ML IV SOLN

[Series 5: T2 · sagittal · 4.0mm · 0.80mm/px · 6 of 19 slices shown (1 of 2)]
[im 1/19]
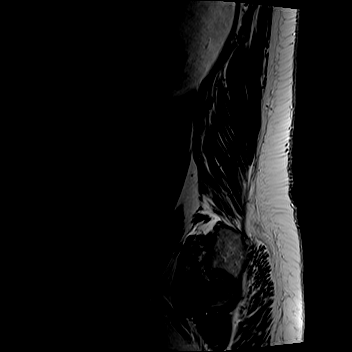
[im 4/19]
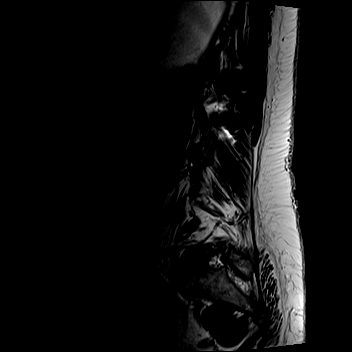
[im 8/19]
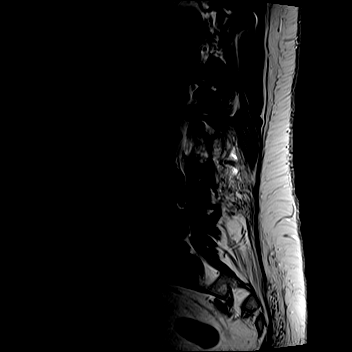
[im 11/19]
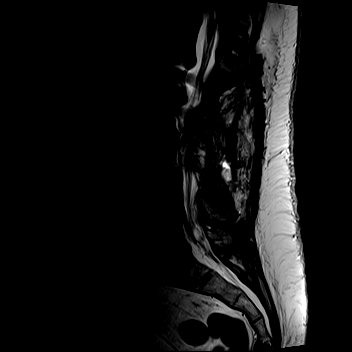
[im 15/19]
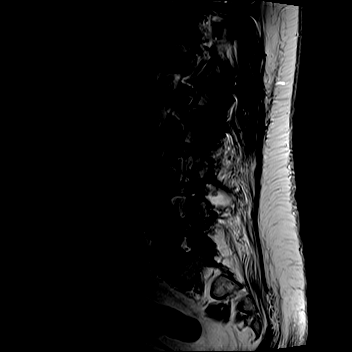
[im 19/19]
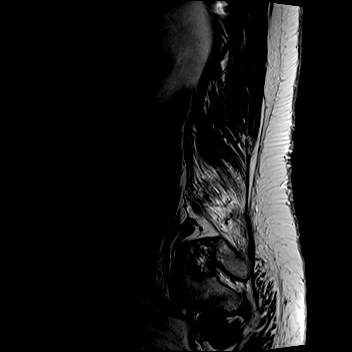

[Series 7: T1 · sagittal · 4.0mm · 0.88mm/px · 4 of 19 slices shown (1 of 2)]
[im 1/19]
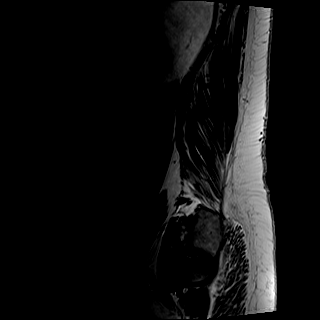
[im 5/19]
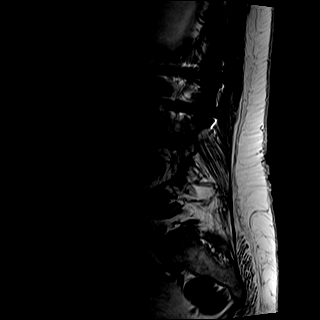
[im 10/19]
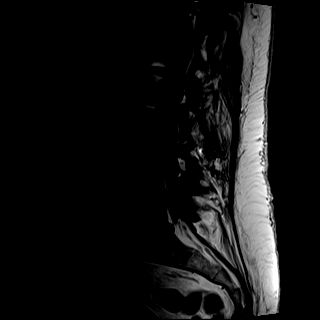
[im 19/19]
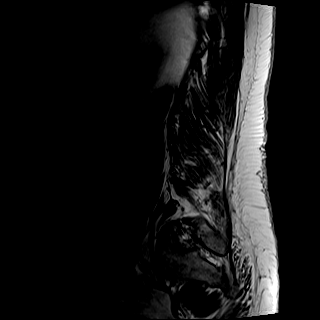

[Series 8: T2 · axial · 5.0mm · 0.62mm/px · z∈[-474,-243]mm · 9 of 31 slices shown (2 of 2)]
[im 1/31]
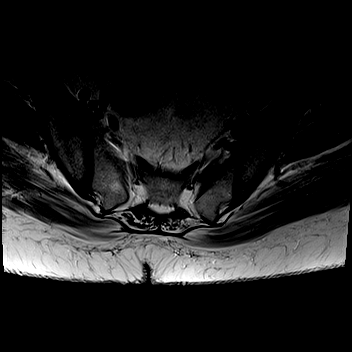
[im 4/31]
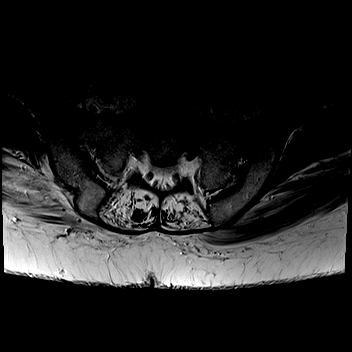
[im 8/31]
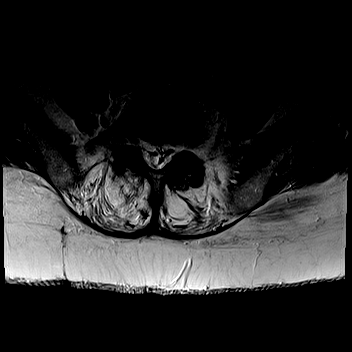
[im 12/31]
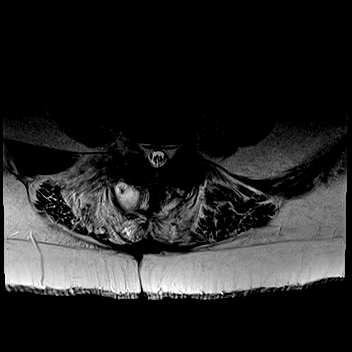
[im 16/31]
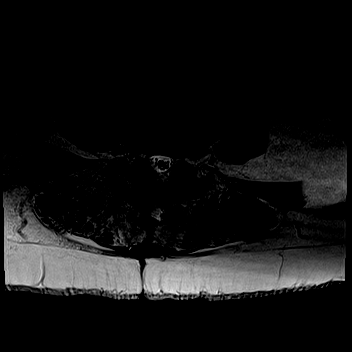
[im 19/31]
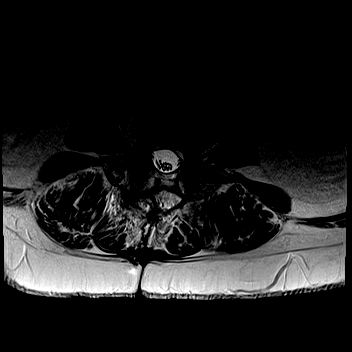
[im 23/31]
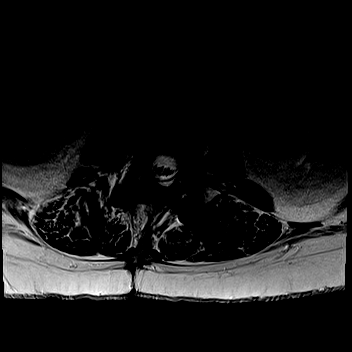
[im 27/31]
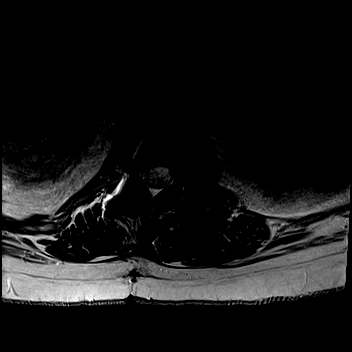
[im 31/31]
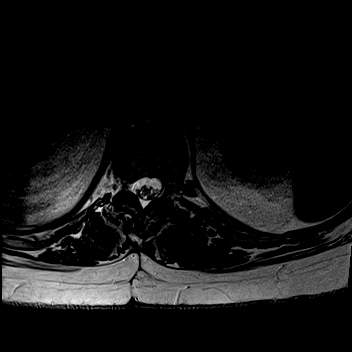

[Series 9: T1 · axial · 5.0mm · 0.34mm/px · z∈[-452,-273]mm · 3 of 31 slices shown (2 of 2)]
[im 4/31]
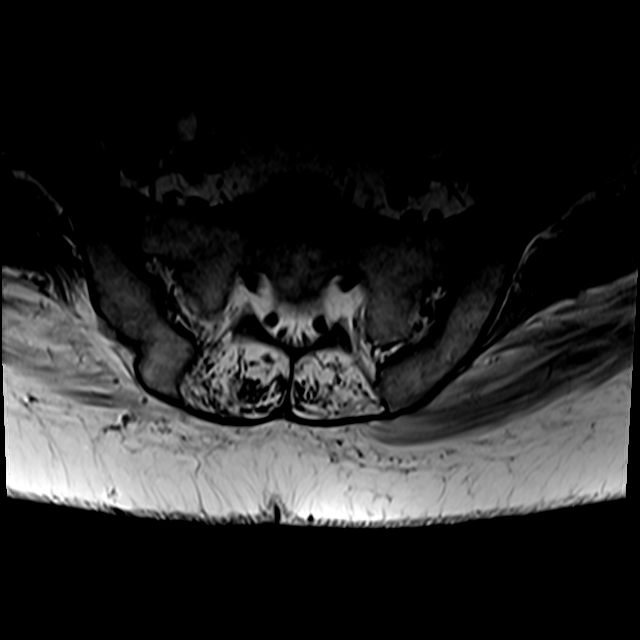
[im 16/31]
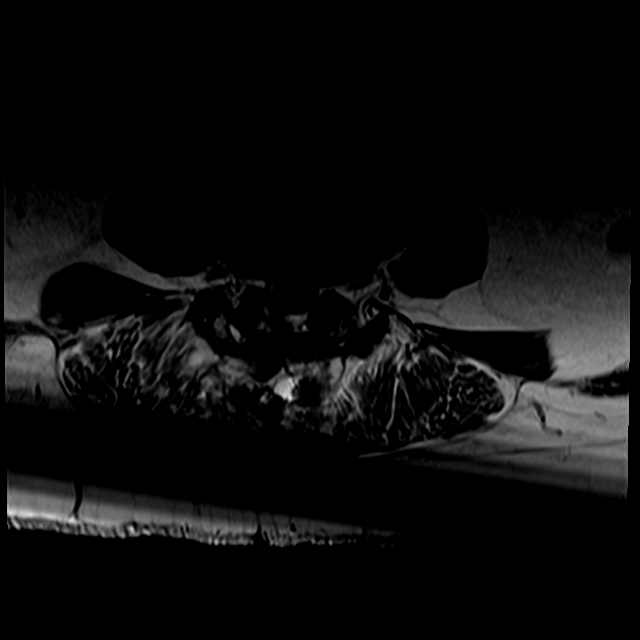
[im 27/31]
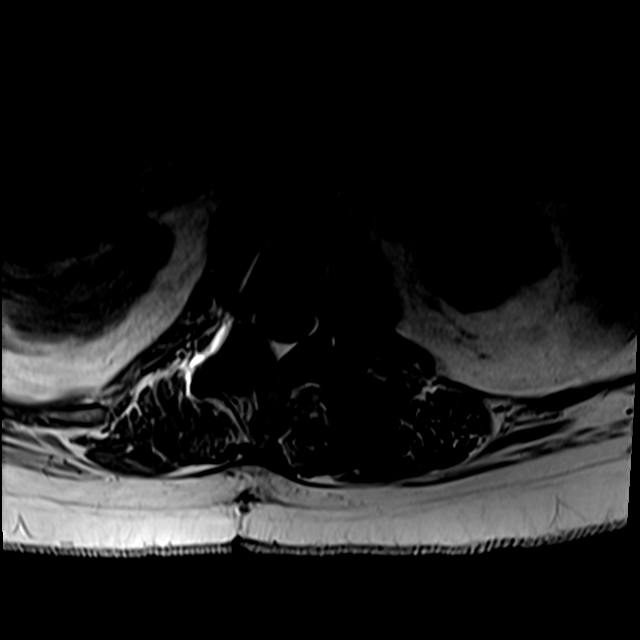

[22 of 48 positions shown; findings below may reference images not displayed]

FINDINGS: MRI THORACIC SPINE FINDINGS

Alignment: Physiologic with preservation of the normal thoracic
kyphosis. Trace chronic retrolisthesis of T11 on T12 and T12 on L1.

Vertebrae: Susceptibility artifact related to prior posterior fusion
present at T10-11. Vertebral body height maintained without acute or
chronic fracture. Bone marrow signal intensity within normal limits.
No discrete or worrisome osseous lesions. No evidence for
osteomyelitis discitis or septic arthritis within the thoracic
spine.

Cord: Questionable signal abnormality seen involving the distal cord
at the level of T10-11 (series 22, images 32, 33), not entirely
certain and could potentially be artifactual given adjacent fusion
hardware. Otherwise, signal intensity within the visualized cord is
normal. No abnormal enhancement. No epidural abscess or other
collection.

Paraspinal and other soft tissues: Chronic postoperative scarring
present within the lower posterior paraspinous soft tissues. No
acute paraspinous edema or inflammation. Asymmetric left renal
atrophy partially visualized.

Disc levels:

Postsurgical changes from prior fusion at C3 through C6 noted within
the cervical spine on counter sequence.

T1-2: Minimal left eccentric disc bulge. Mild left-sided facet
hypertrophy. No significant stenosis.

T2-3: Tiny right paracentral disc protrusion mildly indents the
right ventral thecal sac. No significant spinal stenosis. Foramina
remain patent.

T3-4: Small right paracentral disc protrusion minimally indents the
right ventral thecal sac. Mild facet hypertrophy. No significant
stenosis.

T4-5: Negative interspace. Mild posterior element hypertrophy. No
significant stenosis.

T5-6: Negative interspace. Posterior element hypertrophy. No
significant stenosis.

T6-7: Central disc protrusion indents the ventral thecal sac. Mild
flattening of the ventral cord without cord signal changes.
Prominence of the dorsal epidural fat with mild facet hypertrophy.
No significant spinal stenosis. Foramina remain patent.

T7-8: Minimal disc bulge. Left greater than right facet hypertrophy
with prominence of the dorsal epidural fat. No significant stenosis.

T8-9: Right paracentral disc protrusion indents the ventral thecal
sac. Mild flattening of the right ventral cord without cord signal
changes. Mild posterior element hypertrophy with prominence of the
dorsal epidural fat. No significant stenosis.

T9-10: Left paracentral to foraminal disc protrusion with slight
superior migration. Bilateral facet hypertrophy. No significant
spinal stenosis. Mild left foraminal narrowing. Right neural
foramina remains patent.

T10-11: Disc bulge with tiny central disc protrusion. Prior
posterior decompression and fusion. No residual spinal stenosis.
Probable mild left foraminal narrowing. Right neural foramen remains
patent.

T11-12: Disc bulge with broad-based right paracentral disc
protrusion mildly flattens the ventral thecal sac. Mild facet
hypertrophy. No significant spinal stenosis. Foramina remain patent.

T12-L1:  Mild disc bulge.  No significant stenosis.

MRI LUMBAR SPINE FINDINGS

Segmentation: Standard. Lowest well-formed disc space labeled the
L5-S1 level.

Alignment: Stable alignment. Trace retrolisthesis of L1 on L2, with
trace anterolisthesis of L2 on L3 and L4 on L5. 4 mm anterolisthesis
of L3 on L4.

Vertebrae: Vertebral body height maintained without acute or chronic
fracture. Bone marrow signal intensity within normal limits. No
discrete or worrisome osseous lesions.

Postsurgical changes from prior posterior decompression with fusion
at L1-2. Fracturing of the right transpedicular screw at L2 better
seen on prior CT. Patient also status post posterior decompression
at L2-3 and L3-4. There is extensive edema and enhancement involving
the right posterior paraspinous soft tissues extending from the L2-3
through L4-5 levels, concerning for acute infection/cellulitis
(series 11, image 5). Changes emanate from the right L3-4 and L4-5
facets, concerning for possible septic arthritis. The right L2-3
facet may be involved superiorly as well, difficult to discern given
adjacent susceptibility artifact. There is a superimposed rim
enhancing collection extending posteriorly from the right L4-5 facet
measuring 2.4 x 1.8 x 1.6 cm, consistent with probable abscess
(series 8, image 21). Associated joint effusions within the adjacent
right greater than left L4-5 facets. More mild inflammatory changes
seen within the contralateral left posterior paraspinous soft
tissues as well. There is diffuse epidural enhancement about the
lower thecal sac at the levels of L4-5 and L5-S1 without frank
epidural abscess or collection (series 11, image 9).

No other evidence for osteomyelitis discitis within the lumbar
spine.

Conus medullaris: Extends to the T12 level. No definite visible cord
signal changes seen on this portion of the exam.

Paraspinal and other soft tissues: Acute inflammatory changes within
the right greater than left posterior paraspinous soft tissues as
detailed above. Paraspinous soft tissues demonstrate no other acute
finding. Asymmetric left renal atrophy noted.

Disc levels:

L1-2: Trace retrolisthesis with degenerative intervertebral disc
space narrowing. Diffuse disc bulge with disc desiccation, eccentric
to the right. Prior posterior decompression with fusion. No residual
spinal stenosis. Foramina appear grossly patent.

L2-3: Degenerative intervertebral disc space narrowing with mild
diffuse disc bulge. Prior posterior decompression. Residual
bilateral facet arthrosis. No significant spinal stenosis. Mild
right L2 foraminal narrowing.

L3-4: Anterolisthesis. Degenerative intervertebral disc space
narrowing with diffuse disc bulge. Prior posterior decompression.
Residual bilateral facet arthrosis. No significant spinal stenosis.
Mild right L3 foraminal narrowing. Left neural foramina remains
patent.

L4-5: Diffuse disc bulge with disc desiccation, asymmetric to the
left. Prominent left-sided reactive endplate change with marginal
endplate osteophytic spurring. Moderate to advanced bilateral facet
arthrosis with changes concerning for septic arthritis on the right.
Mild epidural enhancement without frank collection. Resultant
moderate canal with left lateral recess stenosis. Moderate left L4
foraminal narrowing.

L5-S1: Mild disc bulge. Moderate bilateral facet hypertrophy.
Epidural lipomatosis compresses the distal thecal sac. Mild epidural
enhancement without frank collection. No other significant spinal
stenosis. Foramina remain patent.
IMPRESSION: 1. Acute inflammatory changes involving the right posterior
paraspinous soft tissues of the lumbar spine, extending from the
L2-3 through L4-5, concerning for acute infection. Changes are in
close proximity to the L3-4 and L4-5 facets, concerning for possible
septic arthritis. Superimposed 2.4 x 1.8 x 1.6 cm collection
extending posteriorly from the right L4-5 facet suspicious for
abscess. The right L2-3 facet may be involved superiorly as well,
difficult to discern given adjacent susceptibility artifact at this
level.
2. Associated diffuse epidural enhancement about the lower thecal
sac at the levels of L4-5 and L5-S1 without frank epidural abscess.
3. No other evidence for acute infection elsewhere within the
thoracolumbar spine.
4. Question focal cord signal abnormality involving the distal
thoracic cord at the level of T10-11, not entirely certain given
adjacent susceptibility artifact from fusion hardware at this level.
Correlation with physical exam for possible myelopathic symptoms
recommended.
5. Underlying postsurgical changes from prior posterior
decompression and fusion at T10-11 and L1-2, with additional
posterior decompression at L2-3 and L3-4. Fracturing of the right
transpedicular screw at the level of L2 better appreciated on prior
CT.
6. Underlying multilevel thoracolumbar spondylosis as detailed
above, most pronounced at the L4-5 level where there is resultant
moderate canal with left lateral recess stenosis, with moderate left
L4 foraminal narrowing.

## 2021-06-23 IMAGING — XA IR FLUORO GUIDE NDL PLMT / BX
1 series · 1 of 1 positions shown · non-contrast
Comparison: none

INDICATION: 49-year-old female with past medical history significant for
hypertension, nephrolithiasis and morbid obesity with history of
recent L1-2 posterior spinal fusion. She presented to ED on
[DATE] complaining of back pain, right lower extremity
radiculopathy, nausea/vomiting and fever. MRI of the lumbar spine
reveal a right paraspinal abscess at the L4-5 level, adjacent to the
corresponding facet joint. She comes to our service today for a
fluoroscopy guided fine-needle aspiration.

[Series 1: fl (-) angio · 1 of 1 slices shown]
[im 1/1]
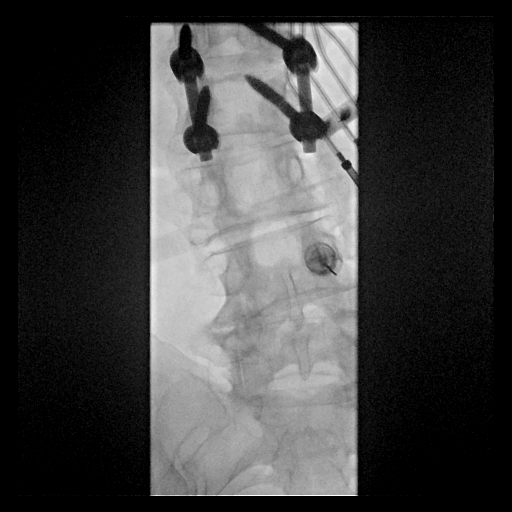

[1 of 1 positions shown; findings below may reference images not displayed]

EXAM:
FLUOROSCOPY GUIDED RIGHT PARASPINAL ABSCESS FINE-NEEDLE ASPIRATION

MEDICATIONS:
The patient is currently admitted to the hospital and receiving
intravenous antibiotics. The antibiotics were administered within an
appropriate time frame prior to the initiation of the procedure.

ANESTHESIA/SEDATION:
The procedure was performed under local anesthesia

COMPLICATIONS:
None immediate.

FLUOROSCOPY TIME:  36 seconds (DAP 5 mGy)

PROCEDURE:
Informed verbal consent was obtained from the patient, following
COVID 19 guidelines, after a thorough discussion of the procedural
risks, benefits and alternatives. All questions were addressed.

Maximal Sterile Barrier Technique was utilized including caps, mask,
sterile gowns, sterile gloves, sterile drape, hand hygiene and skin
antiseptic. A timeout was performed prior to the initiation of the
procedure.

The patient was made to lie prone on the angiography table. The skin
in the lower back was prepped and draped in a sterile fashion. The
L4-5 level was marked under fluoroscopy. The skin was infiltrated
with lidocaine 1% approximally 2 cm to the right of midline at the
L4-5 level. Subsequently, a 20 gauge needle was advanced through the
soft tissues for approximately 5 cm. Then, approximately 4 cc of
blood-tinged purulent fluid was aspirated. The fluid was sent in
capped syringes to the laboratory for Gram stain and culture. The
needle was subsequently withdrawn. The skin was cleaned and dressed
with a sterile bandage.
IMPRESSION: Successful and uncomplicated fluoroscopy guided fine-needle
aspiration of a right posterior paraspinal abscess at the L4-5
level.

## 2021-06-23 IMAGING — MR MR THORACIC SPINE WO/W CM
5 of 9 series · 18 of 48 positions shown · IV contrast (gadavist)
Comparison: Prior CT from [DATE].

CLINICAL DATA: Initial evaluation for acute lower back pain,
infection suspected.

EXAM:
MRI THORACIC AND LUMBAR SPINE WITHOUT AND WITH CONTRAST
TECHNIQUE: Multiplanar and multiecho pulse sequences of the thoracic and lumbar
spine were obtained without and with intravenous contrast.
CONTRAST:  8mL GADAVIST GADOBUTROL 1 MMOL/ML IV SOLN

[Series 18: T1 · sagittal · 3.3mm · 0.62mm/px · 1 of 9 slices shown (1 of 3)]
[im 1/9]
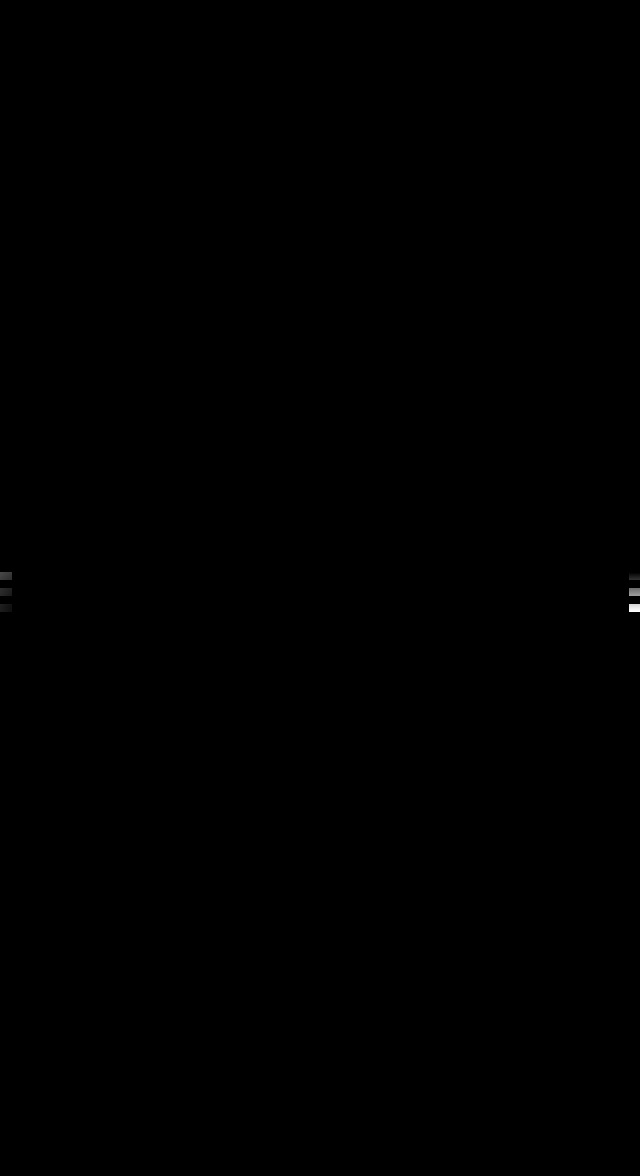

[Series 19: T2 · sagittal · 3.0mm · 0.80mm/px · 3 of 21 slices shown (1 of 2)]
[im 1/21]
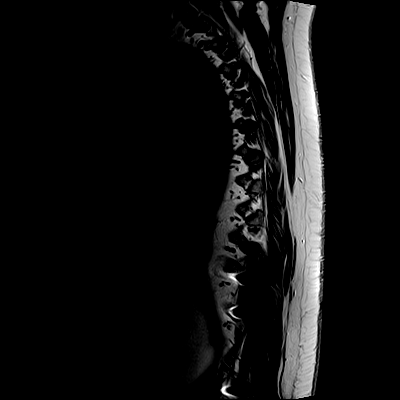
[im 11/21]
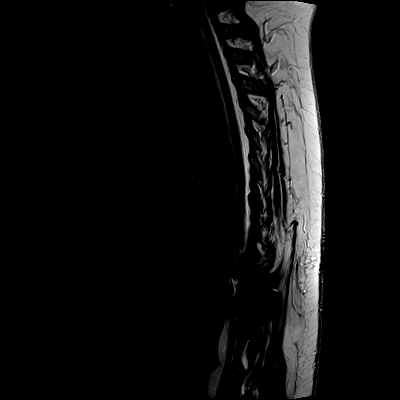
[im 21/21]
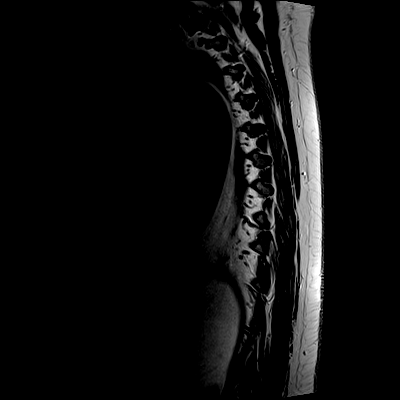

[Series 20: T1 · sagittal · 3.0mm · 0.80mm/px · 4 of 21 slices shown (2 of 3)]
[im 1/21]
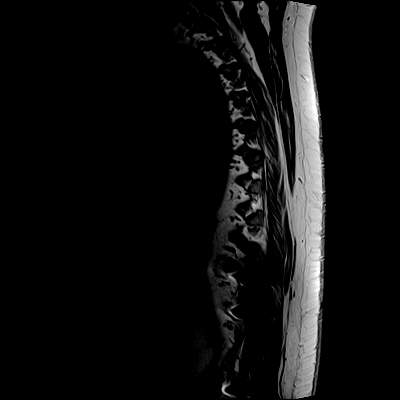
[im 7/21]
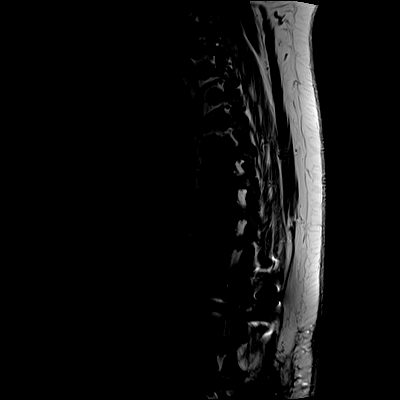
[im 14/21]
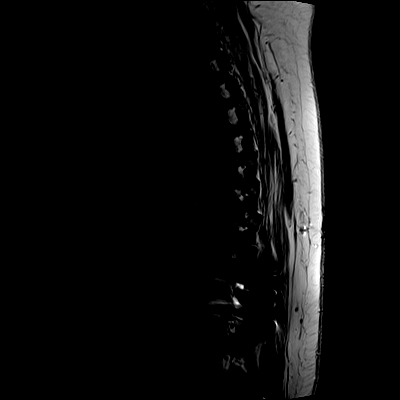
[im 21/21]
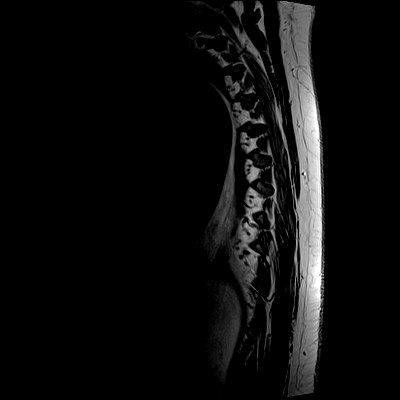

[Series 22: T2 · axial · 5.0mm · 0.59mm/px · z∈[-266,-26]mm · 8 of 39 slices shown (2 of 2)]
[im 1/39]
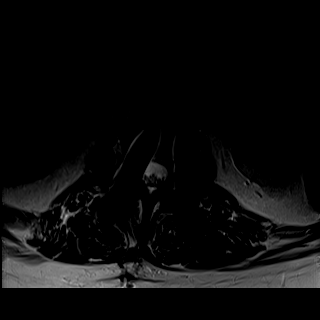
[im 6/39]
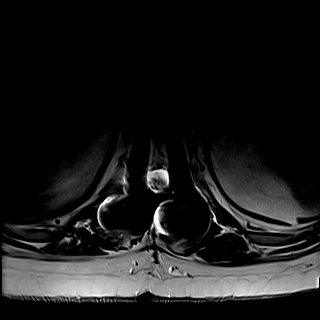
[im 11/39]
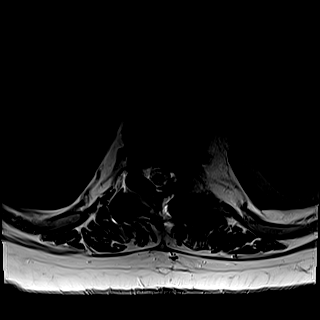
[im 17/39]
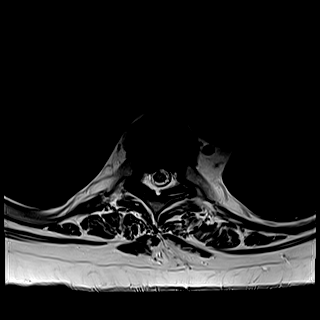
[im 22/39]
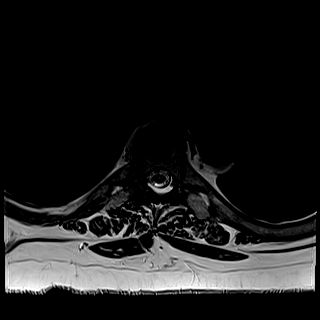
[im 28/39]
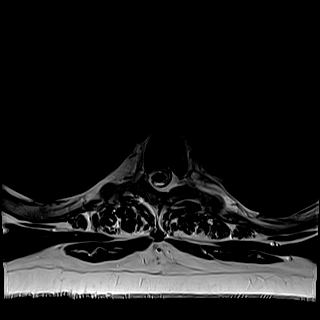
[im 33/39]
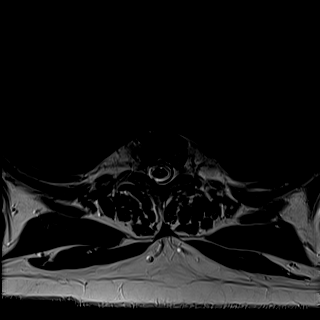
[im 39/39]
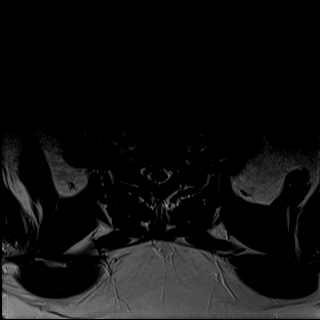

[Series 24: T1 · axial · non-contrast · 5.0mm · 0.33mm/px · z∈[-266,-221]mm · 2 of 39 slices shown (3 of 3)]
[im 1/39]
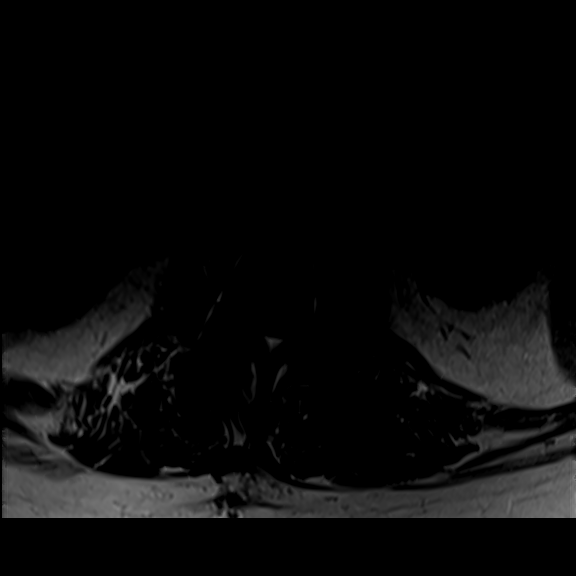
[im 6/39]
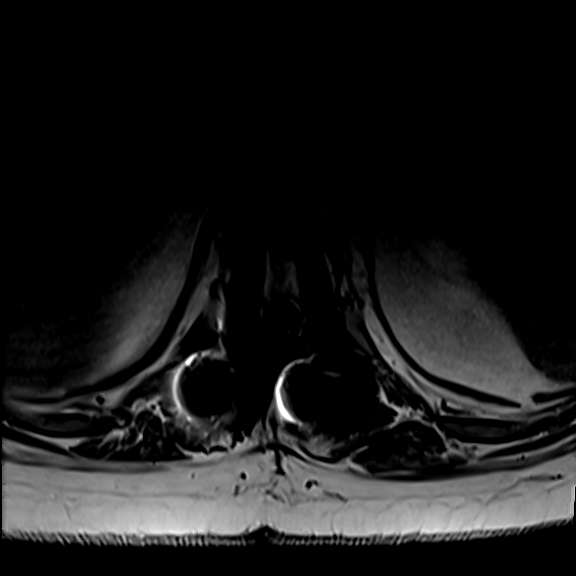

[18 of 48 positions shown; findings below may reference images not displayed]

FINDINGS: MRI THORACIC SPINE FINDINGS

Alignment: Physiologic with preservation of the normal thoracic
kyphosis. Trace chronic retrolisthesis of T11 on T12 and T12 on L1.

Vertebrae: Susceptibility artifact related to prior posterior fusion
present at T10-11. Vertebral body height maintained without acute or
chronic fracture. Bone marrow signal intensity within normal limits.
No discrete or worrisome osseous lesions. No evidence for
osteomyelitis discitis or septic arthritis within the thoracic
spine.

Cord: Questionable signal abnormality seen involving the distal cord
at the level of T10-11 (series 22, images 32, 33), not entirely
certain and could potentially be artifactual given adjacent fusion
hardware. Otherwise, signal intensity within the visualized cord is
normal. No abnormal enhancement. No epidural abscess or other
collection.

Paraspinal and other soft tissues: Chronic postoperative scarring
present within the lower posterior paraspinous soft tissues. No
acute paraspinous edema or inflammation. Asymmetric left renal
atrophy partially visualized.

Disc levels:

Postsurgical changes from prior fusion at C3 through C6 noted within
the cervical spine on counter sequence.

T1-2: Minimal left eccentric disc bulge. Mild left-sided facet
hypertrophy. No significant stenosis.

T2-3: Tiny right paracentral disc protrusion mildly indents the
right ventral thecal sac. No significant spinal stenosis. Foramina
remain patent.

T3-4: Small right paracentral disc protrusion minimally indents the
right ventral thecal sac. Mild facet hypertrophy. No significant
stenosis.

T4-5: Negative interspace. Mild posterior element hypertrophy. No
significant stenosis.

T5-6: Negative interspace. Posterior element hypertrophy. No
significant stenosis.

T6-7: Central disc protrusion indents the ventral thecal sac. Mild
flattening of the ventral cord without cord signal changes.
Prominence of the dorsal epidural fat with mild facet hypertrophy.
No significant spinal stenosis. Foramina remain patent.

T7-8: Minimal disc bulge. Left greater than right facet hypertrophy
with prominence of the dorsal epidural fat. No significant stenosis.

T8-9: Right paracentral disc protrusion indents the ventral thecal
sac. Mild flattening of the right ventral cord without cord signal
changes. Mild posterior element hypertrophy with prominence of the
dorsal epidural fat. No significant stenosis.

T9-10: Left paracentral to foraminal disc protrusion with slight
superior migration. Bilateral facet hypertrophy. No significant
spinal stenosis. Mild left foraminal narrowing. Right neural
foramina remains patent.

T10-11: Disc bulge with tiny central disc protrusion. Prior
posterior decompression and fusion. No residual spinal stenosis.
Probable mild left foraminal narrowing. Right neural foramen remains
patent.

T11-12: Disc bulge with broad-based right paracentral disc
protrusion mildly flattens the ventral thecal sac. Mild facet
hypertrophy. No significant spinal stenosis. Foramina remain patent.

T12-L1:  Mild disc bulge.  No significant stenosis.

MRI LUMBAR SPINE FINDINGS

Segmentation: Standard. Lowest well-formed disc space labeled the
L5-S1 level.

Alignment: Stable alignment. Trace retrolisthesis of L1 on L2, with
trace anterolisthesis of L2 on L3 and L4 on L5. 4 mm anterolisthesis
of L3 on L4.

Vertebrae: Vertebral body height maintained without acute or chronic
fracture. Bone marrow signal intensity within normal limits. No
discrete or worrisome osseous lesions.

Postsurgical changes from prior posterior decompression with fusion
at L1-2. Fracturing of the right transpedicular screw at L2 better
seen on prior CT. Patient also status post posterior decompression
at L2-3 and L3-4. There is extensive edema and enhancement involving
the right posterior paraspinous soft tissues extending from the L2-3
through L4-5 levels, concerning for acute infection/cellulitis
(series 11, image 5). Changes emanate from the right L3-4 and L4-5
facets, concerning for possible septic arthritis. The right L2-3
facet may be involved superiorly as well, difficult to discern given
adjacent susceptibility artifact. There is a superimposed rim
enhancing collection extending posteriorly from the right L4-5 facet
measuring 2.4 x 1.8 x 1.6 cm, consistent with probable abscess
(series 8, image 21). Associated joint effusions within the adjacent
right greater than left L4-5 facets. More mild inflammatory changes
seen within the contralateral left posterior paraspinous soft
tissues as well. There is diffuse epidural enhancement about the
lower thecal sac at the levels of L4-5 and L5-S1 without frank
epidural abscess or collection (series 11, image 9).

No other evidence for osteomyelitis discitis within the lumbar
spine.

Conus medullaris: Extends to the T12 level. No definite visible cord
signal changes seen on this portion of the exam.

Paraspinal and other soft tissues: Acute inflammatory changes within
the right greater than left posterior paraspinous soft tissues as
detailed above. Paraspinous soft tissues demonstrate no other acute
finding. Asymmetric left renal atrophy noted.

Disc levels:

L1-2: Trace retrolisthesis with degenerative intervertebral disc
space narrowing. Diffuse disc bulge with disc desiccation, eccentric
to the right. Prior posterior decompression with fusion. No residual
spinal stenosis. Foramina appear grossly patent.

L2-3: Degenerative intervertebral disc space narrowing with mild
diffuse disc bulge. Prior posterior decompression. Residual
bilateral facet arthrosis. No significant spinal stenosis. Mild
right L2 foraminal narrowing.

L3-4: Anterolisthesis. Degenerative intervertebral disc space
narrowing with diffuse disc bulge. Prior posterior decompression.
Residual bilateral facet arthrosis. No significant spinal stenosis.
Mild right L3 foraminal narrowing. Left neural foramina remains
patent.

L4-5: Diffuse disc bulge with disc desiccation, asymmetric to the
left. Prominent left-sided reactive endplate change with marginal
endplate osteophytic spurring. Moderate to advanced bilateral facet
arthrosis with changes concerning for septic arthritis on the right.
Mild epidural enhancement without frank collection. Resultant
moderate canal with left lateral recess stenosis. Moderate left L4
foraminal narrowing.

L5-S1: Mild disc bulge. Moderate bilateral facet hypertrophy.
Epidural lipomatosis compresses the distal thecal sac. Mild epidural
enhancement without frank collection. No other significant spinal
stenosis. Foramina remain patent.
IMPRESSION: 1. Acute inflammatory changes involving the right posterior
paraspinous soft tissues of the lumbar spine, extending from the
L2-3 through L4-5, concerning for acute infection. Changes are in
close proximity to the L3-4 and L4-5 facets, concerning for possible
septic arthritis. Superimposed 2.4 x 1.8 x 1.6 cm collection
extending posteriorly from the right L4-5 facet suspicious for
abscess. The right L2-3 facet may be involved superiorly as well,
difficult to discern given adjacent susceptibility artifact at this
level.
2. Associated diffuse epidural enhancement about the lower thecal
sac at the levels of L4-5 and L5-S1 without frank epidural abscess.
3. No other evidence for acute infection elsewhere within the
thoracolumbar spine.
4. Question focal cord signal abnormality involving the distal
thoracic cord at the level of T10-11, not entirely certain given
adjacent susceptibility artifact from fusion hardware at this level.
Correlation with physical exam for possible myelopathic symptoms
recommended.
5. Underlying postsurgical changes from prior posterior
decompression and fusion at T10-11 and L1-2, with additional
posterior decompression at L2-3 and L3-4. Fracturing of the right
transpedicular screw at the level of L2 better appreciated on prior
CT.
6. Underlying multilevel thoracolumbar spondylosis as detailed
above, most pronounced at the L4-5 level where there is resultant
moderate canal with left lateral recess stenosis, with moderate left
L4 foraminal narrowing.

## 2021-06-23 MED ORDER — BUTALBITAL-APAP-CAFFEINE 50-325-40 MG PO TABS
1.0000 | ORAL_TABLET | Freq: Once | ORAL | Status: AC
  Administered 2021-06-23: 1 via ORAL
  Filled 2021-06-23: qty 1

## 2021-06-23 MED ORDER — PANTOPRAZOLE SODIUM 40 MG PO TBEC
40.0000 mg | DELAYED_RELEASE_TABLET | Freq: Every day | ORAL | Status: DC
  Administered 2021-06-23 – 2021-07-01 (×9): 40 mg via ORAL
  Filled 2021-06-23 (×9): qty 1

## 2021-06-23 MED ORDER — GADOBUTROL 1 MMOL/ML IV SOLN
8.0000 mL | Freq: Once | INTRAVENOUS | Status: AC | PRN
  Administered 2021-06-23: 8 mL via INTRAVENOUS

## 2021-06-23 MED ORDER — ZINC SULFATE 220 (50 ZN) MG PO CAPS
220.0000 mg | ORAL_CAPSULE | Freq: Every day | ORAL | Status: DC
  Administered 2021-06-24 – 2021-07-01 (×8): 220 mg via ORAL
  Filled 2021-06-23 (×8): qty 1

## 2021-06-23 MED ORDER — VANCOMYCIN HCL IN DEXTROSE 1-5 GM/200ML-% IV SOLN
1000.0000 mg | Freq: Two times a day (BID) | INTRAVENOUS | Status: DC
  Administered 2021-06-23 (×2): 1000 mg via INTRAVENOUS
  Filled 2021-06-23 (×2): qty 200

## 2021-06-23 MED ORDER — ACETAMINOPHEN 650 MG RE SUPP: 650.0000 mg | Freq: Four times a day (QID) | RECTAL | Status: DC | PRN

## 2021-06-23 MED ORDER — ENOXAPARIN SODIUM 40 MG/0.4ML IJ SOSY
40.0000 mg | PREFILLED_SYRINGE | INTRAMUSCULAR | Status: DC
  Administered 2021-06-23 – 2021-06-30 (×8): 40 mg via SUBCUTANEOUS
  Filled 2021-06-23 (×8): qty 0.4

## 2021-06-23 MED ORDER — SODIUM CHLORIDE 0.9 % IV SOLN: Freq: Once | INTRAVENOUS | Status: AC

## 2021-06-23 MED ORDER — SODIUM CHLORIDE 0.9 % IV SOLN
2.0000 g | INTRAVENOUS | Status: DC
  Filled 2021-06-23: qty 20

## 2021-06-23 MED ORDER — GUAIFENESIN-DM 100-10 MG/5ML PO SYRP: 10.0000 mL | ORAL_SOLUTION | ORAL | Status: DC | PRN

## 2021-06-23 MED ORDER — ONDANSETRON HCL 4 MG PO TABS
4.0000 mg | ORAL_TABLET | Freq: Four times a day (QID) | ORAL | Status: DC | PRN
  Administered 2021-06-27: 4 mg via ORAL
  Filled 2021-06-23: qty 1

## 2021-06-23 MED ORDER — LORAZEPAM 2 MG/ML IJ SOLN
1.0000 mg | Freq: Once | INTRAMUSCULAR | Status: AC
  Administered 2021-06-23: 1 mg via INTRAVENOUS
  Filled 2021-06-23: qty 1

## 2021-06-23 MED ORDER — ACETAMINOPHEN 325 MG PO TABS
650.0000 mg | ORAL_TABLET | Freq: Four times a day (QID) | ORAL | Status: DC | PRN
  Administered 2021-06-23 – 2021-06-29 (×2): 650 mg via ORAL
  Filled 2021-06-23 (×2): qty 2

## 2021-06-23 MED ORDER — METRONIDAZOLE 500 MG/100ML IV SOLN
500.0000 mg | Freq: Two times a day (BID) | INTRAVENOUS | Status: DC
  Administered 2021-06-23: 500 mg via INTRAVENOUS
  Filled 2021-06-23: qty 100

## 2021-06-23 MED ORDER — ALBUTEROL SULFATE HFA 108 (90 BASE) MCG/ACT IN AERS
2.0000 | INHALATION_SPRAY | Freq: Four times a day (QID) | RESPIRATORY_TRACT | Status: DC | PRN
  Filled 2021-06-23: qty 6.7

## 2021-06-23 MED ORDER — LIDOCAINE HCL 1 % IJ SOLN
INTRAMUSCULAR | Status: AC
  Administered 2021-06-23: 10 mL
  Filled 2021-06-23: qty 20

## 2021-06-23 MED ORDER — ALBUTEROL SULFATE HFA 108 (90 BASE) MCG/ACT IN AERS: 2.0000 | INHALATION_SPRAY | Freq: Four times a day (QID) | RESPIRATORY_TRACT | Status: DC

## 2021-06-23 MED ORDER — SODIUM CHLORIDE 0.9 % IV SOLN: INTRAVENOUS | Status: DC

## 2021-06-23 MED ORDER — ASCORBIC ACID 500 MG PO TABS
500.0000 mg | ORAL_TABLET | Freq: Every day | ORAL | Status: DC
  Administered 2021-06-24 – 2021-07-01 (×8): 500 mg via ORAL
  Filled 2021-06-23 (×8): qty 1

## 2021-06-23 MED ORDER — SODIUM CHLORIDE 0.9 % IV SOLN
2.0000 g | INTRAVENOUS | Status: DC
  Administered 2021-06-24 – 2021-07-01 (×45): 2 g via INTRAVENOUS
  Filled 2021-06-23 (×53): qty 2000

## 2021-06-23 MED ORDER — SODIUM CHLORIDE 0.9% FLUSH
3.0000 mL | Freq: Two times a day (BID) | INTRAVENOUS | Status: DC
  Administered 2021-06-23 – 2021-07-01 (×15): 3 mL via INTRAVENOUS

## 2021-06-23 MED ORDER — ACETAMINOPHEN 325 MG PO TABS: 650.0000 mg | ORAL_TABLET | Freq: Four times a day (QID) | ORAL | Status: DC | PRN

## 2021-06-23 MED ORDER — ALBUTEROL SULFATE (2.5 MG/3ML) 0.083% IN NEBU: 2.5000 mg | INHALATION_SOLUTION | Freq: Four times a day (QID) | RESPIRATORY_TRACT | Status: DC | PRN

## 2021-06-23 MED ORDER — ATORVASTATIN CALCIUM 10 MG PO TABS
10.0000 mg | ORAL_TABLET | Freq: Every day | ORAL | Status: DC
  Administered 2021-06-24: 10 mg via ORAL
  Filled 2021-06-23: qty 1

## 2021-06-23 MED ORDER — FENTANYL CITRATE PF 50 MCG/ML IJ SOSY: 25.0000 ug | PREFILLED_SYRINGE | INTRAMUSCULAR | Status: DC | PRN

## 2021-06-23 MED ORDER — ONDANSETRON HCL 4 MG/2ML IJ SOLN
4.0000 mg | Freq: Four times a day (QID) | INTRAMUSCULAR | Status: DC | PRN
  Administered 2021-06-23 – 2021-07-01 (×9): 4 mg via INTRAVENOUS
  Filled 2021-06-23 (×9): qty 2

## 2021-06-23 MED ORDER — HYDROCODONE-ACETAMINOPHEN 5-325 MG PO TABS
1.0000 | ORAL_TABLET | ORAL | Status: DC | PRN
  Administered 2021-06-23 – 2021-06-28 (×10): 1 via ORAL
  Filled 2021-06-23 (×10): qty 1

## 2021-06-23 MED ORDER — ENALAPRIL MALEATE 5 MG PO TABS
10.0000 mg | ORAL_TABLET | Freq: Every day | ORAL | Status: DC
  Administered 2021-06-23 – 2021-07-01 (×9): 10 mg via ORAL
  Filled 2021-06-23 (×9): qty 2

## 2021-06-23 NOTE — ED Notes (Signed)
Patient transported to MRI 

## 2021-06-23 NOTE — ED Notes (Signed)
Pt taken off [place], SpO2 92% on room air. Respirations even/unlabored.

## 2021-06-23 NOTE — ED Notes (Signed)
Pt resting comfortably at this time. Pt O2 reduced from 2L to 1L as pt does not require home O2. SpO2 97% on 1L via [place].

## 2021-06-23 NOTE — H&P (Signed)
History and Physical    Mary Chambers DVV:[id number] DOB: 1971-06-17 DOA: 06/22/2021  Referring MD/NP/PA: Cleon Gustin, MD PCP: [name], NP  Patient coming from:   Chief Complaint: Back pain  I have personally briefly reviewed patient's old medical records in [place]   HPI: Mary Chambers Mary Chambers is a 50 y.o. female with medical history significant of hypertension, nephrolithiasis, morbid obesity, and prior back surgery back in 2020 who presents with complaints of back pain.  She had gone to urgent care 4 days ago with complaints of nausea and vomiting.  CT scan of the abdomen and pelvis did not note any significant normality  at that time. She was diagnosed with a urinary tract infection after urinalysis was obtained noting large leukocytes with rare bacteria, and 21-50 WBCs.  She was discharged home on Keflex which she reports taking as prescribed, but noted no improvement in symptoms.  Patient started to have lower right back pain that she describes as sharp in nature with radiation down her right leg 2 days ago.  Pain has been constant and progressively worsening.  Associated symptoms include fevers up to 101 F at home, urinary urgency, headache, sinus congestion, and a nonproductive cough.  She was not able to try much of anything due to the nausea and vomiting with inability to keep any significant food or liquids down.  Denies having any significant fall or trauma to onset symptoms abdominal pain, chest pain, leg swelling, shortness of breath, or diarrhea.  ED Course: Upon admission into the emergency department patient was noted to have a temperature of 100 F, pulse 76-119, other vital signs relatively maintained.  Labs significant for WBC 11.2 and sed rate 96.  MRI of the thoracic and lumbar spine was obtained which showed acute inflammatory changes in of in the right paraspinous soft tissue of the lumbar spine and L2-L5 with concern for possible septic  arthritis with 2.4 x 1.8 x 1.6 cm collection time for an abscess, question of vocal cord abnormality involving the distal thoracic cord at level T10 -11 and fracturing of the right transverse pedicular screw at the level of the L2 COVID-19 screening was incidentally positive.  Blood cultures have been obtained  Review of Systems  Constitutional:  Positive for fever and malaise/fatigue.  HENT:  Positive for congestion.   Eyes:  Negative for photophobia.  Respiratory:  Positive for cough. Negative for shortness of breath.   Cardiovascular:  Negative for chest pain.  Gastrointestinal:  Positive for nausea and vomiting. Negative for abdominal pain and diarrhea.  Genitourinary:  Positive for flank pain and urgency.  Musculoskeletal:  Positive for back pain and joint pain. Negative for falls.  Skin:  Negative for rash.  Neurological:  Positive for headaches. Negative for loss of consciousness.  Psychiatric/Behavioral:  Negative for substance abuse. The patient has insomnia.    Past Medical History:  Diagnosis Date   Arthritis    Depression    Hypertension    Kidney stone    Obesity     Past Surgical History:  Procedure Laterality Date   ABDOMINAL HYSTERECTOMY     BACK SURGERY     CARPAL TUNNEL RELEASE     CHOLECYSTECTOMY     LITHOTRIPSY     neck fusion     TUBAL LIGATION       reports that she has never smoked. She has never used smokeless tobacco. She reports that she does not drink alcohol and does not use drugs.  Allergies  Allergen Reactions   Baclofen Other (See Comments)   Doxycycline Itching   Percocet [Oxycodone-Acetaminophen] Hives   Septra [Sulfamethoxazole-Trimethoprim] Hives   Gabapentin Rash    Family History  Problem Relation Age of Onset   Diabetes Mother    Hypertension Mother    Cancer Father    Parkinson's disease Father    Heart disease Father    Heart disease Sister    Heart disease Brother    Colon cancer Neg Hx    Rectal cancer Neg Hx     Stomach cancer Neg Hx     Prior to Admission medications   Medication Sig Start Date End Date Taking? Authorizing Provider  Ascorbic Acid (VITAMIN C) 500 MG CAPS Take 500 mg by mouth daily.    [provider]  benzonatate (TESSALON) 100 MG capsule Take 1 capsule (100 mg total) by mouth 3 (three) times daily as needed. 05/03/21   Mar Daring, PA-C  cephALEXin (KEFLEX) 500 MG capsule Take 1 capsule (500 mg total) by mouth 3 (three) times daily for 10 days. 06/19/21 06/29/21  Fatima Blank, MD  cholecalciferol (VITAMIN D3) 25 MCG (1000 UT) tablet Take 1,000 Units by mouth daily.    [provider]  cyclobenzaprine (FLEXERIL) 10 MG tablet Take 1 tablet (10 mg total) by mouth 3 (three) times daily as needed for muscle spasms. 03/30/21   Brunetta Jeans, PA-C  enalapril (VASOTEC) 10 MG tablet Take 10 mg by mouth daily.    [provider]  fluticasone (FLONASE) 50 MCG/ACT nasal spray Place 2 sprays into both nostrils daily. 05/03/21   Brunetta Jeans, PA-C  hydrochlorothiazide (HYDRODIURIL) 25 MG tablet Take 25 mg by mouth daily.    [provider]  Multiple Vitamin (MULTIVITAMIN) capsule Take 1 capsule by mouth daily.     [provider]  naproxen (NAPROSYN) 500 MG tablet Take 1 tablet (500 mg total) by mouth 2 (two) times daily with a meal. 03/30/21   Brunetta Jeans, PA-C  pantoprazole (PROTONIX) 20 MG tablet Take 1 tablet (20 mg total) by mouth daily. 04/22/20   Quintella Reichert, MD  POTASSIUM PO Take 1,000 mg by mouth daily.    [provider]  traMADol (ULTRAM) 50 MG tablet Take 50 mg by mouth 3 (three) times daily as needed. 02/02/21   [provider]  diphenhydrAMINE (BENADRYL) 25 mg capsule Take by mouth. 03/10/16 06/21/19  [provider]  LYRICA 50 MG capsule Take 50 mg by mouth 3 (three) times daily. 04/15/18 06/07/19  [provider]    Physical Exam:  Constitutional: Obese female no acute  distress Vitals:   06/22/21 2300 06/22/21 2338 06/23/21 0225 06/23/21 0613  BP: (!) 145/74 (!) 145/78 (!) 145/82 (!) 144/78  Pulse: 86 (!) 104 100 99  Resp:  _0 Temp:   100 F (37.8 C) 99.7 F (37.6 C)  TempSrc:   Oral Oral  SpO2: 95% 97% 98% 92%  Weight:      Height:       Eyes: PERRL, lids and conjunctivae normal ENMT: Mucous membranes are moist. Posterior pharynx clear of any exudate or lesions.Normal dentition.  Neck: normal, supple, no masses, no thyromegaly Respiratory: clear to auscultation bilaterally, no wheezing, no crackles. Normal respiratory effort. No accessory muscle use.  Cardiovascular: Regular rate and rhythm, no murmurs / rubs / gallops. No extremity edema. 2+ pedal pulses. No carotid bruits.  Abdomen: no tenderness, no masses palpated. No hepatosplenomegaly.  Bowel sounds positive.  Musculoskeletal: no clubbing / cyanosis.  Tenderness to palpation of the lumbar spine and paraspinal musculature most significantly on the right-hand side. Skin: no rashes, lesions, ulcers. No induration Neurologic: CN 2-12 grossly intact. Sensation intact, DTR normal. Strength 5/5 in all 4.  Psychiatric: Normal judgment and insight. Alert and oriented x 3. Normal mood.     Labs on Admission: I have personally reviewed following labs and imaging studies  CBC: Recent Labs  Lab 06/19/21 0125 06/22/21 2257  WBC 10.2 11.2*  NEUTROABS 6.0 7.2  HGB 12.9 12.3  HCT 39.0 38.6  MCV 83.0 83.7  PLT 195 662   Basic Metabolic Panel: Recent Labs  Lab 06/19/21 0125 06/22/21 2257  NA 136 139  K 3.5 3.7  CL 98 100  CO2 26 26  GLUCOSE 111* 96  BUN 16 16  CREATININE 0.88 0.89  CALCIUM 9.3 9.6   GFR: Estimated Creatinine Clearance: 101.7 mL/min (by C-G formula based on SCr of 0.89 mg/dL). Liver Function Tests: Recent Labs  Lab 06/19/21 0125  AST 88*  ALT 224*  ALKPHOS 215*  BILITOT 0.6  PROT 7.4  ALBUMIN 4.0   Recent Labs  Lab 06/19/21 0125  LIPASE 24   No  results for input(s): AMMONIA in the last 168 hours. Coagulation Profile: No results for input(s): INR, PROTIME in the last 168 hours. Cardiac Enzymes: No results for input(s): CKTOTAL, CKMB, CKMBINDEX, TROPONINI in the last 168 hours. BNP (last 3 results) No results for input(s): PROBNP in the last 8760 hours. HbA1C: No results for input(s): HGBA1C in the last 72 hours. CBG: No results for input(s): GLUCAP in the last 168 hours. Lipid Profile: No results for input(s): CHOL, HDL, LDLCALC, TRIG, CHOLHDL, LDLDIRECT in the last 72 hours. Thyroid Function Tests: No results for input(s): TSH, T4TOTAL, FREET4, T3FREE, THYROIDAB in the last 72 hours. Anemia Panel: No results for input(s): VITAMINB12, FOLATE, FERRITIN, TIBC, IRON, RETICCTPCT in the last 72 hours. Urine analysis:    Component Value Date/Time   COLORURINE STRAW (A) 06/19/2021 0348   APPEARANCEUR CLEAR 06/19/2021 0348   APPEARANCEUR Cloudy 09/09/2014 1856   LABSPEC 1.010 06/19/2021 0348   LABSPEC 1.019 09/09/2014 1856   PHURINE 7.0 06/19/2021 0348   GLUCOSEU NEGATIVE 06/19/2021 0348   GLUCOSEU Negative 09/09/2014 1856   HGBUR NEGATIVE 06/19/2021 0348   BILIRUBINUR NEGATIVE 06/19/2021 0348   BILIRUBINUR negative 09/20/2020 1738   BILIRUBINUR Negative 09/09/2014 1856   KETONESUR NEGATIVE 06/19/2021 0348   PROTEINUR NEGATIVE 06/19/2021 0348   UROBILINOGEN 0.2 09/20/2020 1738   UROBILINOGEN 0.2 04/16/2019 2015   NITRITE NEGATIVE 06/19/2021 0348   LEUKOCYTESUR LARGE (A) 06/19/2021 0348   LEUKOCYTESUR 3+ 09/09/2014 1856   Sepsis Labs: Recent Results (from the past 240 hour(s))  Resp Panel by RT-PCR (Flu A&B, Covid) Nasopharyngeal Swab     Status: Abnormal   Collection Time: 06/22/21 10:57 PM   Specimen: Nasopharyngeal Swab; Nasopharyngeal(NP) swabs in vial transport medium  Result Value Ref Range Status   SARS Coronavirus 2 by RT PCR POSITIVE (A) NEGATIVE Final    Comment: RESULT CALLED TO, READ BACK BY AND VERIFIED  WITH: Burgess Amor RN AT 0006 06/23/21 [place] (NOTE) SARS-CoV-2 target nucleic acids are DETECTED.  The SARS-CoV-2 RNA is generally detectable in upper respiratory specimens during the acute phase of infection. Positive results are indicative of the presence of the identified virus, but do not rule out bacterial infection or co-infection with other pathogens not detected by the test. Clinical correlation  with patient history and other diagnostic information is necessary to determine patient infection status. The expected result is Negative.  Fact Sheet for Patients: EntrepreneurPulse.com.au  Fact Sheet for Healthcare Providers: IncredibleEmployment.be  This test is not yet approved or cleared by the [place] FDA and  has been authorized for detection and/or diagnosis of SARS-CoV-2 by FDA under an Emergency Use Authorization (EUA).  This EUA will remain in effect (meaning this test can be  used) for the duration of  the COVID-19 declaration under Section 564(b)(1) of the Act, 21 U.S.C. section 360bbb-3(b)(1), unless the authorization is terminated or revoked sooner.     Influenza A by PCR NEGATIVE NEGATIVE Final   Influenza B by PCR NEGATIVE NEGATIVE Final    Comment: (NOTE) The Xpert Xpress SARS-CoV-2/FLU/RSV plus assay is intended as an aid in the diagnosis of influenza from Nasopharyngeal swab specimens and should not be used as a sole basis for treatment. Nasal washings and aspirates are unacceptable for Xpert Xpress SARS-CoV-2/FLU/RSV testing.  Fact Sheet for Patients: EntrepreneurPulse.com.au  Fact Sheet for Healthcare Providers: IncredibleEmployment.be  This test is not yet approved or cleared by the [place] FDA and has been authorized for detection and/or diagnosis of SARS-CoV-2 by FDA under an Emergency Use Authorization (EUA). This EUA will remain in effect (meaning this test can be used)  for the duration of the COVID-19 declaration under Section 564(b)(1) of the Act, 21 U.S.C. section 360bbb-3(b)(1), unless the authorization is terminated or revoked.  Performed at [facility], [address] [place], [place] [address]      Radiological Exams on Admission: MR THORACIC SPINE W WO CONTRAST  Result Date: 06/23/2021 CLINICAL DATA:  Initial evaluation for acute lower back pain, infection suspected. EXAM: MRI THORACIC AND LUMBAR SPINE WITHOUT AND WITH CONTRAST TECHNIQUE: Multiplanar and multiecho pulse sequences of the thoracic and lumbar spine were obtained without and with intravenous contrast. CONTRAST:  22m GADAVIST GADOBUTROL 1 MMOL/ML IV SOLN COMPARISON:  Prior CT from 06/23/2021. FINDINGS: MRI THORACIC SPINE FINDINGS Alignment: Physiologic with preservation of the normal thoracic kyphosis. Trace chronic retrolisthesis of T11 on T12 and T12 on L1. Vertebrae: Susceptibility artifact related to prior posterior fusion present at T10-11. Vertebral body height maintained without acute or chronic fracture. Bone marrow signal intensity within normal limits. No discrete or worrisome osseous lesions. No evidence for osteomyelitis discitis or septic arthritis within the thoracic spine. Cord: Questionable signal abnormality seen involving the distal cord at the level of T10-11 (series 22, images 32, 33), not entirely certain and could potentially be artifactual given adjacent fusion hardware. Otherwise, signal intensity within the visualized cord is normal. No abnormal enhancement. No epidural abscess or other collection. Paraspinal and other soft tissues: Chronic postoperative scarring present within the lower posterior paraspinous soft tissues. No acute paraspinous edema or inflammation. Asymmetric left renal atrophy partially visualized. Disc levels: Postsurgical changes from prior fusion at C3 through C6 noted within the cervical spine on counter sequence. T1-2: Minimal  left eccentric disc bulge. Mild left-sided facet hypertrophy. No significant stenosis. T2-3: Tiny right paracentral disc protrusion mildly indents the right ventral thecal sac. No significant spinal stenosis. Foramina remain patent. T3-4: Small right paracentral disc protrusion minimally indents the right ventral thecal sac. Mild facet hypertrophy. No significant stenosis. T4-5: Negative interspace. Mild posterior element hypertrophy. No significant stenosis. T5-6: Negative interspace. Posterior element hypertrophy. No significant stenosis. T6-7: Central disc protrusion indents the ventral thecal sac. Mild flattening of the ventral cord without cord signal changes. Prominence  of the dorsal epidural fat with mild facet hypertrophy. No significant spinal stenosis. Foramina remain patent. T7-8: Minimal disc bulge. Left greater than right facet hypertrophy with prominence of the dorsal epidural fat. No significant stenosis. T8-9: Right paracentral disc protrusion indents the ventral thecal sac. Mild flattening of the right ventral cord without cord signal changes. Mild posterior element hypertrophy with prominence of the dorsal epidural fat. No significant stenosis. T9-10: Left paracentral to foraminal disc protrusion with slight superior migration. Bilateral facet hypertrophy. No significant spinal stenosis. Mild left foraminal narrowing. Right neural foramina remains patent. T10-11: Disc bulge with tiny central disc protrusion. Prior posterior decompression and fusion. No residual spinal stenosis. Probable mild left foraminal narrowing. Right neural foramen remains patent. T11-12: Disc bulge with broad-based right paracentral disc protrusion mildly flattens the ventral thecal sac. Mild facet hypertrophy. No significant spinal stenosis. Foramina remain patent. T12-L1:  Mild disc bulge.  No significant stenosis. MRI LUMBAR SPINE FINDINGS Segmentation: Standard. Lowest well-formed disc space labeled the L5-S1 level.  Alignment: Stable alignment. Trace retrolisthesis of L1 on L2, with trace anterolisthesis of L2 on L3 and L4 on L5. 4 mm anterolisthesis of L3 on L4. Vertebrae: Vertebral body height maintained without acute or chronic fracture. Bone marrow signal intensity within normal limits. No discrete or worrisome osseous lesions. Postsurgical changes from prior posterior decompression with fusion at L1-2. Fracturing of the right transpedicular screw at L2 better seen on prior CT. Patient also status post posterior decompression at L2-3 and L3-4. There is extensive edema and enhancement involving the right posterior paraspinous soft tissues extending from the L2-3 through L4-5 levels, concerning for acute infection/cellulitis (series 11, image 5). Changes emanate from the right L3-4 and L4-5 facets, concerning for possible septic arthritis. The right L2-3 facet may be involved superiorly as well, difficult to discern given adjacent susceptibility artifact. There is a superimposed rim enhancing collection extending posteriorly from the right L4-5 facet measuring 2.4 x 1.8 x 1.6 cm, consistent with probable abscess (series 8, image 21). Associated joint effusions within the adjacent right greater than left L4-5 facets. More mild inflammatory changes seen within the contralateral left posterior paraspinous soft tissues as well. There is diffuse epidural enhancement about the lower thecal sac at the levels of L4-5 and L5-S1 without frank epidural abscess or collection (series 11, image 9). No other evidence for osteomyelitis discitis within the lumbar spine. Conus medullaris: Extends to the T12 level. No definite visible cord signal changes seen on this portion of the exam. Paraspinal and other soft tissues: Acute inflammatory changes within the right greater than left posterior paraspinous soft tissues as detailed above. Paraspinous soft tissues demonstrate no other acute finding. Asymmetric left renal atrophy noted. Disc levels:  L1-2: Trace retrolisthesis with degenerative intervertebral disc space narrowing. Diffuse disc bulge with disc desiccation, eccentric to the right. Prior posterior decompression with fusion. No residual spinal stenosis. Foramina appear grossly patent. L2-3: Degenerative intervertebral disc space narrowing with mild diffuse disc bulge. Prior posterior decompression. Residual bilateral facet arthrosis. No significant spinal stenosis. Mild right L2 foraminal narrowing. L3-4: Anterolisthesis. Degenerative intervertebral disc space narrowing with diffuse disc bulge. Prior posterior decompression. Residual bilateral facet arthrosis. No significant spinal stenosis. Mild right L3 foraminal narrowing. Left neural foramina remains patent. L4-5: Diffuse disc bulge with disc desiccation, asymmetric to the left. Prominent left-sided reactive endplate change with marginal endplate osteophytic spurring. Moderate to advanced bilateral facet arthrosis with changes concerning for septic arthritis on the right. Mild epidural enhancement without frank collection. Resultant  moderate canal with left lateral recess stenosis. Moderate left L4 foraminal narrowing. L5-S1: Mild disc bulge. Moderate bilateral facet hypertrophy. 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 Mild epidural enhancement without frank collection. No other significant spinal stenosis. Foramina remain patent. IMPRESSION: 1. Acute inflammatory changes involving the right posterior paraspinous soft tissues of the lumbar spine, extending from the L2-3 through L4-5, concerning for acute infection. Changes are in close proximity to the L3-4 and L4-5 facets, concerning for possible septic arthritis. Superimposed 2.4 x 1.8 x 1.6 cm collection extending posteriorly from the right L4-5 facet suspicious for abscess. The right L2-3 facet may be involved superiorly as well, difficult to discern given adjacent susceptibility artifact at this level. 2. Associated diffuse  epidural enhancement about the lower thecal sac at the levels of L4-5 and L5-S1 without frank epidural abscess. 3. No other evidence for acute infection elsewhere within the thoracolumbar spine. 4. Question focal cord signal abnormality involving the distal thoracic cord at the level of A26-33, not entirely certain given adjacent susceptibility artifact from fusion hardware at this level. Correlation with physical exam for possible myelopathic symptoms recommended. 5. Underlying postsurgical changes from prior posterior decompression and fusion at T10-11 and L1-2, with additional posterior decompression at L2-3 and L3-4. Fracturing of the right transpedicular screw at the level of L2 better appreciated on prior CT. 6. Underlying multilevel thoracolumbar spondylosis as detailed above, most pronounced at the L4-5 level where there is resultant moderate canal with left lateral recess stenosis, with moderate left L4 foraminal narrowing. Electronically Signed   By: Jeannine Boga M.D.   On: 06/23/2021 05:38   MR Lumbar Spine W Wo Contrast  Result Date: 06/23/2021 CLINICAL DATA:  Initial evaluation for acute lower back pain, infection suspected. EXAM: MRI THORACIC AND LUMBAR SPINE WITHOUT AND WITH CONTRAST TECHNIQUE: Multiplanar and multiecho pulse sequences of the thoracic and lumbar spine were obtained without and with intravenous contrast. CONTRAST:  64m GADAVIST GADOBUTROL 1 MMOL/ML IV SOLN COMPARISON:  Prior CT from 06/23/2021. FINDINGS: MRI THORACIC SPINE FINDINGS Alignment: Physiologic with preservation of the normal thoracic kyphosis. Trace chronic retrolisthesis of T11 on T12 and T12 on L1. Vertebrae: Susceptibility artifact related to prior posterior fusion present at T10-11. Vertebral body height maintained without acute or chronic fracture. Bone marrow signal intensity within normal limits. No discrete or worrisome osseous lesions. No evidence for osteomyelitis discitis or septic arthritis within the  thoracic spine. Cord: Questionable signal abnormality seen involving the distal cord at the level of T10-11 (series 22, images 32, 33), not entirely certain and could potentially be artifactual given adjacent fusion hardware. Otherwise, signal intensity within the visualized cord is normal. No abnormal enhancement. No epidural abscess or other collection. Paraspinal and other soft tissues: Chronic postoperative scarring present within the lower posterior paraspinous soft tissues. No acute paraspinous edema or inflammation. Asymmetric left renal atrophy partially visualized. Disc levels: Postsurgical changes from prior fusion at C3 through C6 noted within the cervical spine on counter sequence. T1-2: Minimal left eccentric disc bulge. Mild left-sided facet hypertrophy. No significant stenosis. T2-3: Tiny right paracentral disc protrusion mildly indents the right ventral thecal sac. No significant spinal stenosis. Foramina remain patent. T3-4: Small right paracentral disc protrusion minimally indents the right ventral thecal sac. Mild facet hypertrophy. No significant stenosis. T4-5: Negative interspace. Mild posterior element hypertrophy. No significant stenosis. T5-6: Negative interspace. Posterior element hypertrophy. No significant stenosis. T6-7: Central disc protrusion indents the ventral thecal sac. Mild flattening of the ventral cord without cord signal  changes. Prominence of the dorsal epidural fat with mild facet hypertrophy. No significant spinal stenosis. Foramina remain patent. T7-8: Minimal disc bulge. Left greater than right facet hypertrophy with prominence of the dorsal epidural fat. No significant stenosis. T8-9: Right paracentral disc protrusion indents the ventral thecal sac. Mild flattening of the right ventral cord without cord signal changes. Mild posterior element hypertrophy with prominence of the dorsal epidural fat. No significant stenosis. T9-10: Left paracentral to foraminal disc  protrusion with slight superior migration. Bilateral facet hypertrophy. No significant spinal stenosis. Mild left foraminal narrowing. Right neural foramina remains patent. T10-11: Disc bulge with tiny central disc protrusion. Prior posterior decompression and fusion. No residual spinal stenosis. Probable mild left foraminal narrowing. Right neural foramen remains patent. T11-12: Disc bulge with broad-based right paracentral disc protrusion mildly flattens the ventral thecal sac. Mild facet hypertrophy. No significant spinal stenosis. Foramina remain patent. T12-L1:  Mild disc bulge.  No significant stenosis. MRI LUMBAR SPINE FINDINGS Segmentation: Standard. Lowest well-formed disc space labeled the L5-S1 level. Alignment: Stable alignment. Trace retrolisthesis of L1 on L2, with trace anterolisthesis of L2 on L3 and L4 on L5. 4 mm anterolisthesis of L3 on L4. Vertebrae: Vertebral body height maintained without acute or chronic fracture. Bone marrow signal intensity within normal limits. No discrete or worrisome osseous lesions. Postsurgical changes from prior posterior decompression with fusion at L1-2. Fracturing of the right transpedicular screw at L2 better seen on prior CT. Patient also status post posterior decompression at L2-3 and L3-4. There is extensive edema and enhancement involving the right posterior paraspinous soft tissues extending from the L2-3 through L4-5 levels, concerning for acute infection/cellulitis (series 11, image 5). Changes emanate from the right L3-4 and L4-5 facets, concerning for possible septic arthritis. The right L2-3 facet may be involved superiorly as well, difficult to discern given adjacent susceptibility artifact. There is a superimposed rim enhancing collection extending posteriorly from the right L4-5 facet measuring 2.4 x 1.8 x 1.6 cm, consistent with probable abscess (series 8, image 21). Associated joint effusions within the adjacent right greater than left L4-5 facets.  More mild inflammatory changes seen within the contralateral left posterior paraspinous soft tissues as well. There is diffuse epidural enhancement about the lower thecal sac at the levels of L4-5 and L5-S1 without frank epidural abscess or collection (series 11, image 9). No other evidence for osteomyelitis discitis within the lumbar spine. Conus medullaris: Extends to the T12 level. No definite visible cord signal changes seen on this portion of the exam. Paraspinal and other soft tissues: Acute inflammatory changes within the right greater than left posterior paraspinous soft tissues as detailed above. Paraspinous soft tissues demonstrate no other acute finding. Asymmetric left renal atrophy noted. Disc levels: L1-2: Trace retrolisthesis with degenerative intervertebral disc space narrowing. Diffuse disc bulge with disc desiccation, eccentric to the right. Prior posterior decompression with fusion. No residual spinal stenosis. Foramina appear grossly patent. L2-3: Degenerative intervertebral disc space narrowing with mild diffuse disc bulge. Prior posterior decompression. Residual bilateral facet arthrosis. No significant spinal stenosis. Mild right L2 foraminal narrowing. L3-4: Anterolisthesis. Degenerative intervertebral disc space narrowing with diffuse disc bulge. Prior posterior decompression. Residual bilateral facet arthrosis. No significant spinal stenosis. Mild right L3 foraminal narrowing. Left neural foramina remains patent. L4-5: Diffuse disc bulge with disc desiccation, asymmetric to the left. Prominent left-sided reactive endplate change with marginal endplate osteophytic spurring. Moderate to advanced bilateral facet arthrosis with changes concerning for septic arthritis on the right. Mild epidural enhancement without frank  collection. Resultant moderate canal with left lateral recess stenosis. Moderate left L4 foraminal narrowing. L5-S1: Mild disc bulge. Moderate bilateral facet hypertrophy.  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 Mild epidural enhancement without frank collection. No other significant spinal stenosis. Foramina remain patent. IMPRESSION: 1. Acute inflammatory changes involving the right posterior paraspinous soft tissues of the lumbar spine, extending from the L2-3 through L4-5, concerning for acute infection. Changes are in close proximity to the L3-4 and L4-5 facets, concerning for possible septic arthritis. Superimposed 2.4 x 1.8 x 1.6 cm collection extending posteriorly from the right L4-5 facet suspicious for abscess. The right L2-3 facet may be involved superiorly as well, difficult to discern given adjacent susceptibility artifact at this level. 2. Associated diffuse epidural enhancement about the lower thecal sac at the levels of L4-5 and L5-S1 without frank epidural abscess. 3. No other evidence for acute infection elsewhere within the thoracolumbar spine. 4. Question focal cord signal abnormality involving the distal thoracic cord at the level of [id number], not entirely certain given adjacent susceptibility artifact from fusion hardware at this level. Correlation with physical exam for possible myelopathic symptoms recommended. 5. Underlying postsurgical changes from prior posterior decompression and fusion at T10-11 and L1-2, with additional posterior decompression at L2-3 and L3-4. Fracturing of the right transpedicular screw at the level of L2 better appreciated on prior CT. 6. Underlying multilevel thoracolumbar spondylosis as detailed above, most pronounced at the L4-5 level where there is resultant moderate canal with left lateral recess stenosis, with moderate left L4 foraminal narrowing. Electronically Signed   By: Jeannine Boga M.D.   On: 06/23/2021 05:38   CT ABDOMEN PELVIS W CONTRAST  Result Date: 06/23/2021 CLINICAL DATA:  Abdominal pain and fever. Numbness and tingling to the right leg since last night. No trauma. History of kidney  infection. EXAM: CT ABDOMEN AND PELVIS WITH CONTRAST TECHNIQUE: Multidetector CT imaging of the abdomen and pelvis was performed using the standard protocol following bolus administration of intravenous contrast. CONTRAST:  93m OMNIPAQUE IOHEXOL 350 MG/ML SOLN COMPARISON:  06/19/2021 FINDINGS: Lower chest: Lung bases are clear. Hepatobiliary: No focal liver abnormality is seen. Status post cholecystectomy. No biliary dilatation. Pancreas: Unremarkable. No pancreatic ductal dilatation or surrounding inflammatory changes. Spleen: Normal in size without focal abnormality. Adrenals/Urinary Tract: No adrenal gland nodules. Bilateral renal parenchymal scarring or lobulation with parenchymal atrophy involving the left kidney. No solid mass lesion identified. No hydronephrosis or hydroureter. Bladder is unremarkable. Stomach/Bowel: Stomach, small bowel, and colon are not abnormally distended. No wall thickening or inflammatory changes are appreciated. Appendix is normal. Vascular/Lymphatic: No significant vascular findings are present. No enlarged abdominal or pelvic lymph nodes. Reproductive: Status post hysterectomy. No adnexal masses. Other: No free air or free fluid in the abdomen. Abdominal wall musculature appears intact. Musculoskeletal: Postoperative changes with plate and screw fixations from T10-T11 and L1-L2. The pedicular screw at L2 on the right is broken. This may have progressed since the previous study. Degenerative changes and mild scoliosis of the lumbar spine with convexity towards the left. IMPRESSION: 1. Asymmetric atrophy of the left kidney. 2. No acute abnormalities demonstrated in the abdomen or pelvis. No bowel obstruction or inflammation. No abscess identified. 3. Postoperative changes in the lumbar spine. The right L2 pedicular screw is broken, possibly progressed since prior study. Electronically Signed   By: WLucienne CapersM.D.   On: 06/23/2021 00:25   CT L-SPINE NO CHARGE  Result Date:  06/23/2021 CLINICAL DATA:  Initial evaluation for low back pain  with right lower extremity numbness and tingling. Fevers. EXAM: CT LUMBAR SPINE WITHOUT CONTRAST TECHNIQUE: Multidetector CT imaging of the lumbar spine was performed without intravenous contrast administration. Multiplanar CT image reconstructions were also generated. COMPARISON:  MRI from 10/01/2019. FINDINGS: Segmentation: Standard. Lowest well-formed disc space labeled the L5-S1 level. Alignment: Sigmoid scoliotic curvature of the lumbar spine. Trace retrolisthesis of T12 on L1 and L1 on L2, with trace anterolisthesis of L2 on L3. 4 mm anterolisthesis of L3 on L4. Vertebrae: Vertebral body height maintained without acute or chronic fracture. Visualized sacrum and pelvis intact. No findings to suggest osteomyelitis discitis or septic arthritis. SI joints symmetric and within normal limits. Patient is status post posterior fusion at L1-2 with bilateral transpedicular screws in place. No periprosthetic lucency to suggest loosening or infection. The right-sided transpedicular screw at L2 is fractured (series 13, image 37). Hardware otherwise intact. Sequelae of prior screw removal noted at L3 and L4. Paraspinal and other soft tissues: Chronic postoperative changes present within the posterior paraspinous soft tissues. No visible acute paraspinous inflammatory changes. No loculated collections. Partially visualized abdominal viscera better evaluated on concomitant CT of the abdomen and pelvis. Disc levels: L1-2: Trace retrolisthesis with degenerative intervertebral disc space narrowing and diffuse disc bulge. Prior posterior decompression and fusion. No residual spinal stenosis. No more than mild bilateral L1 foraminal narrowing. L2-3: Degenerative intervertebral disc space narrowing with diffuse disc bulge, slightly eccentric to the right. Prior posterior decompression. Moderate bilateral facet arthrosis. No significant residual spinal stenosis.  Moderate right worse than left L2 foraminal narrowing. L3-4: 4 mm anterolisthesis. Degenerative intervertebral disc space narrowing with right eccentric disc bulge. Superimposed right foraminal to extraforaminal disc protrusion contacts the exiting right L3 nerve root (series 10, image 59). Moderate bilateral facet arthrosis. Prior posterior decompression. No definite residual spinal stenosis. Mild left with moderate right L3 foraminal narrowing. L4-5: Degenerative intervertebral disc space narrowing with diffuse disc bulge and mild disc desiccation. Disc bulging appears to be asymmetric to the right. Mild reactive endplate spurring. Moderate facet and ligament flavum hypertrophy. Prior right hemi laminectomy. Persistent moderate to severe canal with bilateral lateral recess stenosis. Moderate left worse than right L4 foraminal stenosis. L5-S1: Shallow broad-based central disc protrusion with mild annular calcification (series 10, image 90). Bulging disc closely approximates both of the descending S1 nerve roots without frank impingement. Moderate bilateral facet arthrosis. No significant spinal stenosis. Mild-to-moderate bilateral L5 foraminal narrowing. IMPRESSION: 1. No CT evidence for acute abnormality within the lumbar spine. 2. Postoperative changes from prior posterior decompression and fusion at L1-2 without residual spinal stenosis. The right transpedicular screw at L2 is fractured. No periprosthetic lucency to suggest loosening or infection. 3. Right foraminal to extraforaminal disc protrusion at L3-4, contacting and potentially affecting the exiting right L3 nerve root. 4. Multifactorial degenerative changes at L4-5 with resultant moderate to severe canal with bilateral lateral recess stenosis, with moderate left worse than right L4 foraminal stenosis. 5. Additional mild-to-moderate foraminal narrowing elsewhere within the lumbar spine at L2, L3, and L5 as above. Electronically Signed   By: Jeannine Boga M.D.   On: 06/23/2021 01:02    Chest x-ray: Independently reviewed.  Normal sinus rhythm at 91 bpm  Assessment/Plan Sepsis secondary to Suspect septic arthritis of lumbar spine with possible abscess: Patient presents with complaints of back pain with radiation down her right leg and fever noted up to 101.2 F with heart rates elevated up to 119 meeting SIRS criteria.  Labs significant for ESR 98 and  WBC 11.2. Imaging concerning for possibility of septic arthritis of the lumbar spine with possible abscess. No initial lactic acid have been obtained before patient had been given IV fluids and empiric antibiotics of vancomycin, metronidazole, and Rocephin. Neurosurgery had been consulted, but recommended no acute surgical intervention recommended. -Admit to a medical telemetry bed -Follow-up blood and aspirate cultures once able to be obtained  -N.p.o. for possible need of procedure -Check lactic acid -Continue empiric antibiotics of vancomycin, metronidazole, and Rocephin -Hydrocodone/Fentanyl IV as needed for moderate to severe pain respectively -IR consulted for possible I&D -Appreciate neurosurgery consultative services, follow-up for any further recommendations  COVID-19 infection: Acute.  Patient reports that she has had a nonproductive cough for few days now.  Initially thought symptoms were secondary to sinuses.  Noted to be incidentally noted to be positive for COVID-19. -COVID-19 focus order set -Airborne precautions -Check chest x-ray -Albuterol inhaler as needed -Antitussives as needed -Vitamin C and zinc  Suspect urinary tract infection: Patient reported having urinary urgency with intermittent incontinence and not able to make it to the bathroom in time.  Urinalysis was significant for large leukocytes, rare bacteria, and 21-50 WBCs. -Follow-up urine culture -Patient on empiric antibiotics as seen above  Transaminitis: Acute.  Labs from 9/18 noted alkaline phosphatase  215, AST 88, and ALT 224.   -Check LFTs and hepatitis panel  Essential hypertension: Blood pressures are initially elevated at 161/90.  Home blood pressure medications include enalapril 10 mg daily, furosemide 20 mg daily, and hydrochlorothiazide 25 mg daily. -Question reason why patient is on 2 diuretics which were not initially restarted due to the patient being septic.  Will need to reassess and resume if medically appropriate -Continue enalapril  Back pain with radicular: Acute on chronic.  Patient with prior history of lumbar fusion back in 2020.  Home medications included tramadol. -Pain medications as seen above  Hyperlipidemia -Will consider resuming atorvastatin tomorrow if liver enzymes do not appear to be worsening  Morbid obesity: BMI 41.66 kg per metered square.  Patient had been on phentermine in the outpatient setting. -Consider resuming phentermine in the outpatient setting DVT prophylaxis: Lovenox Code Status:Full Family Communication: None requested Disposition Plan: Hopefully discharge home once medically stable Consults called: Neurosurgery and IR Admission status: inpatient, require more than 2 midnight stay for IV antibiotic  Norval Morton MD [facility]   If 7PM-7AM, please contact night-coverage   06/23/2021, 7:14 AM

## 2021-06-23 NOTE — Progress Notes (Signed)
Pharmacy Antibiotic Note  Mary Chambers is a 50 y.o. female admitted on 06/22/2021 with  spinal abscess .  Pharmacy has been consulted for vancomycin dosing.  Plan: Vancomycin 1000mg  x1 @ [facility] ED. Will start 1000mg  IV q12h now since loading dose was quite low.  Ceftriaxone and Flagyl per MD dosing -Monitor renal function, clinical status, and antibiotic plan -Order vanc levels as necessary  Height: 5\' 7"  (170.2 cm) Weight: 120.7 kg (266 lb) IBW/kg (Calculated) : 61.6  Temp (24hrs), Avg:99.9 F (37.7 C), Min:99.3 F (37.4 C), Max:101.2 F (38.4 C)  Recent Labs  Lab 06/19/21 0125 06/22/21 2257  WBC 10.2 11.2*  CREATININE 0.88 0.89    Estimated Creatinine Clearance: 101.7 mL/min (by C-G formula based on SCr of 0.89 mg/dL).    Allergies  Allergen Reactions   Baclofen Other (See Comments)   Doxycycline Itching   Percocet [Oxycodone-Acetaminophen] Hives   Septra [Sulfamethoxazole-Trimethoprim] Hives   Gabapentin Rash    Antimicrobials this admission: Vanc 9/22 >>  CTX 9/21 >>  Flagyl 9/21 >>  Dose adjustments this admission: N/A  Microbiology results: 9/21 BCx:  Thank you for allowing pharmacy to be a part of this patient's care.  Joetta Manners, PharmD, [facility] Emergency Medicine Clinical Pharmacist ED RPh Phone: [place]: [phone]

## 2021-06-23 NOTE — ED Notes (Signed)
Pt transported to IR 

## 2021-06-23 NOTE — ED Notes (Signed)
Pt SpO2 89% on RA. Pt placed on 2L via [place]. SpO2 97% at this time on 2L via [place]. Respirations even/unlabored.

## 2021-06-23 NOTE — Progress Notes (Signed)
PHARMACY - PHYSICIAN COMMUNICATION CRITICAL VALUE ALERT - BLOOD CULTURE IDENTIFICATION (BCID)  Mary Chambers Tamiya Colello is an 50 y.o. female who presented to [facility] on 06/22/2021 with a chief complaint of spinal abscess  Assessment:  Pt presented with spinal abscess. BCID>>4/4 bottles with enterococcus faecalis w/o resistance gene. We will optimize vanc/ceftriaxone to ampicillin and will FYI message MD. ID will see pt in AM.   Name of physician (or Provider) Contacted: Dr Cyd Silence  Current antibiotics: Vanc/ceftriaxone/flagyl  Changes to prescribed antibiotics recommended:  Change vanc/ceftriaxone/flagyl to ampicillin 2g IV q4  Results for orders placed or performed during the hospital encounter of 06/22/21  Blood Culture ID Panel (Reflexed) (Collected: 06/22/2021 11:22 PM)  Result Value Ref Range   Enterococcus faecalis DETECTED (A) NOT DETECTED   Enterococcus Faecium NOT DETECTED NOT DETECTED   Listeria monocytogenes NOT DETECTED NOT DETECTED   Staphylococcus species NOT DETECTED NOT DETECTED   Staphylococcus aureus (BCID) NOT DETECTED NOT DETECTED   Staphylococcus epidermidis NOT DETECTED NOT DETECTED   Staphylococcus lugdunensis NOT DETECTED NOT DETECTED   Streptococcus species NOT DETECTED NOT DETECTED   Streptococcus agalactiae NOT DETECTED NOT DETECTED   Streptococcus pneumoniae NOT DETECTED NOT DETECTED   Streptococcus pyogenes NOT DETECTED NOT DETECTED   A.calcoaceticus-baumannii NOT DETECTED NOT DETECTED   Bacteroides fragilis NOT DETECTED NOT DETECTED   Enterobacterales NOT DETECTED NOT DETECTED   Enterobacter cloacae complex NOT DETECTED NOT DETECTED   Escherichia coli NOT DETECTED NOT DETECTED   Klebsiella aerogenes NOT DETECTED NOT DETECTED   Klebsiella oxytoca NOT DETECTED NOT DETECTED   Klebsiella pneumoniae NOT DETECTED NOT DETECTED   Proteus species NOT DETECTED NOT DETECTED   Salmonella species NOT DETECTED NOT DETECTED   Serratia marcescens NOT DETECTED  NOT DETECTED   Haemophilus influenzae NOT DETECTED NOT DETECTED   Neisseria meningitidis NOT DETECTED NOT DETECTED   Pseudomonas aeruginosa NOT DETECTED NOT DETECTED   Stenotrophomonas maltophilia NOT DETECTED NOT DETECTED   Candida albicans NOT DETECTED NOT DETECTED   Candida auris NOT DETECTED NOT DETECTED   Candida glabrata NOT DETECTED NOT DETECTED   Candida krusei NOT DETECTED NOT DETECTED   Candida parapsilosis NOT DETECTED NOT DETECTED   Candida tropicalis NOT DETECTED NOT DETECTED   Cryptococcus neoformans/gattii NOT DETECTED NOT DETECTED   Vancomycin resistance NOT DETECTED NOT DETECTED    Onnie Boer, PharmD, BCIDP, AAHIVP, CPP Infectious Disease Pharmacist 06/23/2021 11:38 PM

## 2021-06-23 NOTE — ED Provider Notes (Signed)
Patient transferred from [place] for MRI to further evaluate fever, back pain and radiculopathy. She has had previous lumbar surgery. MRI with findings c/w paraspinous soft tissue infection with abscess.   D/W Dr. Zada Finders who has reviewed images. Confirms that she will not need surgery. Recommendation will be antibiotics and possible IR drainage - admit to medicine. He will see patient in consult.   Orpah Greek, MD 06/23/21 [phone]

## 2021-06-23 NOTE — Procedures (Signed)
INTERVENTIONAL NEURORADIOLOGY BRIEF POSTPROCEDURE NOTE  Fluoroscopy guided fine-needle aspiration of a right paraspinal abscess   Attending: Dr.Searcy Miyoshi de Sindy Messing  Assistant: None.   Diagnosis: Posterior paraspinal soft tissue abscess   Access site: Percutaneous   Anesthesia: Local   Medication used: 10 mL lidocaine 1%.  Complications: None   Estimated blood loss: None.   Specimen: Approximally 4 cc of purulent fluid sent to lab in 2 syringes   Findings: Right posterior paraspinal abscess.   The patient tolerated the procedure well without incident or complication and is in stable condition.

## 2021-06-23 NOTE — Consult Note (Signed)
Reason for Consult:Low back pain Referring Physician: Orpah Greek, MD  HPI: Mary Chambers is a 50 y.o. female with a PmHx significant for HTN, nephrolithiasis, and morbid obesity. In 09/2019, she underwent exploration of L2/3 with L1/2 discectomy and fusion with pedicle screw fixation and posterolateral arthrodesis. She presents to the ED with complaints of increasing back pain and RLE radiculopathy. She stated her pain radiates into her right buttock and down into her RLE and into her big toe. She was evaluated at urgent care 4 days ago with complaints of N/V and was subsequently diagnosed with a UTI and discharged home on Keflex. Approximately 2 days ago, she had an increase in her low back pain with radiation into her RLE. She describes the pain as sharp in character and an 8/10 in severity. She has subjective right quadriceps weakness. She feels that she is continuing to worsen. She also had complaints of fevers up to 101 F at home, urinary urgency, headache, sinus congestion, and a nonproductive cough. While in the ED her COVID-19 screening was incidentally positive. MRI of her lumbar spine was obtained and neurosurgery was asked to consult.   Past Medical History:  Diagnosis Date   Arthritis    Depression    Hypertension    Kidney stone    Obesity     Past Surgical History:  Procedure Laterality Date   ABDOMINAL HYSTERECTOMY     BACK SURGERY     CARPAL TUNNEL RELEASE     CHOLECYSTECTOMY     LITHOTRIPSY     neck fusion     TUBAL LIGATION      Family History  Problem Relation Age of Onset   Diabetes Mother    Hypertension Mother    Cancer Father    Parkinson's disease Father    Heart disease Father    Heart disease Sister    Heart disease Brother    Colon cancer Neg Hx    Rectal cancer Neg Hx    Stomach cancer Neg Hx     Social History:  reports that she has never smoked. She has never used smokeless tobacco. She reports that she does not drink  alcohol and does not use drugs.  Allergies:  Allergies  Allergen Reactions   Baclofen Other (See Comments)   Doxycycline Itching   Percocet [Oxycodone-Acetaminophen] Hives   Septra [Sulfamethoxazole-Trimethoprim] Hives   Gabapentin Rash    Medications: I have reviewed the patient's current medications.  Results for orders placed or performed during the hospital encounter of 06/22/21 (from the past 48 hour(s))  CBC with Differential     Status: Abnormal   Collection Time: 06/22/21 10:57 PM  Result Value Ref Range   WBC 11.2 (H) 4.0 - 10.5 K/uL   RBC 4.61 3.87 - 5.11 MIL/uL   Hemoglobin 12.3 12.0 - 15.0 g/dL   HCT 38.6 36.0 - 46.0 %   MCV 83.7 80.0 - 100.0 fL   MCH 26.7 26.0 - 34.0 pg   MCHC 31.9 30.0 - 36.0 g/dL   RDW 14.0 11.5 - 15.5 %   Platelets 243 150 - 400 K/uL   nRBC 0.0 0.0 - 0.2 %   Neutrophils Relative % 65 %   Neutro Abs 7.2 1.7 - 7.7 K/uL   Lymphocytes Relative 24 %   Lymphs Abs 2.7 0.7 - 4.0 K/uL   Monocytes Relative 11 %   Monocytes Absolute 1.2 (H) 0.1 - 1.0 K/uL   Eosinophils Relative 0 %   Eosinophils  Absolute 0.0 0.0 - 0.5 K/uL   Basophils Relative 0 %   Basophils Absolute 0.0 0.0 - 0.1 K/uL   Immature Granulocytes 0 %   Abs Immature Granulocytes 0.03 0.00 - 0.07 K/uL    Comment: Performed at [facility], [address] [place], [place] [address]  Basic metabolic panel     Status: None   Collection Time: 06/22/21 10:57 PM  Result Value Ref Range   Sodium 139 135 - 145 mmol/L   Potassium 3.7 3.5 - 5.1 mmol/L   Chloride 100 98 - 111 mmol/L   CO2 26 22 - 32 mmol/L   Glucose, Bld 96 70 - 99 mg/dL    Comment: Glucose reference range applies only to samples taken after fasting for at least 8 hours.   BUN 16 6 - 20 mg/dL   Creatinine, Ser 0.89 0.44 - 1.00 mg/dL   Calcium 9.6 8.9 - 10.3 mg/dL   GFR, Estimated >60 >60 mL/min    Comment: (NOTE) Calculated using the CKD-EPI Creatinine Equation (2021)    Anion gap 13 5 - 15     Comment: Performed at [facility], [address] [place], [place] [address]  Sedimentation rate     Status: Abnormal   Collection Time: 06/22/21 10:57 PM  Result Value Ref Range   Sed Rate 96 (H) 0 - 22 mm/hr    Comment: Performed at [facility], [address], [place], [place] [address]  Resp Panel by RT-PCR (Flu A&B, Covid) Nasopharyngeal Swab     Status: Abnormal   Collection Time: 06/22/21 10:57 PM   Specimen: Nasopharyngeal Swab; Nasopharyngeal(NP) swabs in vial transport medium  Result Value Ref Range   SARS Coronavirus 2 by RT PCR POSITIVE (A) NEGATIVE    Comment: RESULT CALLED TO, READ BACK BY AND VERIFIED WITH: Burgess Amor RN AT 0006 06/23/21 [place] (NOTE) SARS-CoV-2 target nucleic acids are DETECTED.  The SARS-CoV-2 RNA is generally detectable in upper respiratory specimens during the acute phase of infection. Positive results are indicative of the presence of the identified virus, but do not rule out bacterial infection or co-infection with other pathogens not detected by the test. Clinical correlation with patient history and other diagnostic information is necessary to determine patient infection status. The expected result is Negative.  Fact Sheet for Patients: EntrepreneurPulse.com.au  Fact Sheet for Healthcare Providers: IncredibleEmployment.be  This test is not yet approved or cleared by the [place] FDA and  has been authorized for detection and/or diagnosis of SARS-CoV-2 by FDA under an Emergency Use Authorization (EUA).  This EUA will remain in effect (meaning this test can be  used) for the duration of  the COVID-19 declaration under Section 564(b)(1) of the Act, 21 U.S.C. section 360bbb-3(b)(1), unless the authorization is terminated or revoked sooner.     Influenza A by PCR NEGATIVE NEGATIVE   Influenza B by PCR NEGATIVE NEGATIVE    Comment: (NOTE) The Xpert Xpress  SARS-CoV-2/FLU/RSV plus assay is intended as an aid in the diagnosis of influenza from Nasopharyngeal swab specimens and should not be used as a sole basis for treatment. Nasal washings and aspirates are unacceptable for Xpert Xpress SARS-CoV-2/FLU/RSV testing.  Fact Sheet for Patients: EntrepreneurPulse.com.au  Fact Sheet for Healthcare Providers: IncredibleEmployment.be  This test is not yet approved or cleared by the [place] FDA and has been authorized for detection and/or diagnosis of SARS-CoV-2 by FDA under an Emergency Use Authorization (EUA). This EUA will remain in effect (meaning this  test can be used) for the duration of the COVID-19 declaration under Section 564(b)(1) of the Act, 21 U.S.C. section 360bbb-3(b)(1), unless the authorization is terminated or revoked.  Performed at [facility], [address], [place], [place] [address]     MR THORACIC SPINE W WO CONTRAST  Result Date: 06/23/2021 CLINICAL DATA:  Initial evaluation for acute lower back pain, infection suspected. EXAM: MRI THORACIC AND LUMBAR SPINE WITHOUT AND WITH CONTRAST TECHNIQUE: Multiplanar and multiecho pulse sequences of the thoracic and lumbar spine were obtained without and with intravenous contrast. CONTRAST:  45mL GADAVIST GADOBUTROL 1 MMOL/ML IV SOLN COMPARISON:  Prior CT from 06/23/2021. FINDINGS: MRI THORACIC SPINE FINDINGS Alignment: Physiologic with preservation of the normal thoracic kyphosis. Trace chronic retrolisthesis of T11 on T12 and T12 on L1. Vertebrae: Susceptibility artifact related to prior posterior fusion present at T10-11. Vertebral body height maintained without acute or chronic fracture. Bone marrow signal intensity within normal limits. No discrete or worrisome osseous lesions. No evidence for osteomyelitis discitis or septic arthritis within the thoracic spine. Cord: Questionable signal abnormality seen involving the distal  cord at the level of T10-11 (series 22, images 32, 33), not entirely certain and could potentially be artifactual given adjacent fusion hardware. Otherwise, signal intensity within the visualized cord is normal. No abnormal enhancement. No epidural abscess or other collection. Paraspinal and other soft tissues: Chronic postoperative scarring present within the lower posterior paraspinous soft tissues. No acute paraspinous edema or inflammation. Asymmetric left renal atrophy partially visualized. Disc levels: Postsurgical changes from prior fusion at C3 through C6 noted within the cervical spine on counter sequence. T1-2: Minimal left eccentric disc bulge. Mild left-sided facet hypertrophy. No significant stenosis. T2-3: Tiny right paracentral disc protrusion mildly indents the right ventral thecal sac. No significant spinal stenosis. Foramina remain patent. T3-4: Small right paracentral disc protrusion minimally indents the right ventral thecal sac. Mild facet hypertrophy. No significant stenosis. T4-5: Negative interspace. Mild posterior element hypertrophy. No significant stenosis. T5-6: Negative interspace. Posterior element hypertrophy. No significant stenosis. T6-7: Central disc protrusion indents the ventral thecal sac. Mild flattening of the ventral cord without cord signal changes. Prominence of the dorsal epidural fat with mild facet hypertrophy. No significant spinal stenosis. Foramina remain patent. T7-8: Minimal disc bulge. Left greater than right facet hypertrophy with prominence of the dorsal epidural fat. No significant stenosis. T8-9: Right paracentral disc protrusion indents the ventral thecal sac. Mild flattening of the right ventral cord without cord signal changes. Mild posterior element hypertrophy with prominence of the dorsal epidural fat. No significant stenosis. T9-10: Left paracentral to foraminal disc protrusion with slight superior migration. Bilateral facet hypertrophy. No significant  spinal stenosis. Mild left foraminal narrowing. Right neural foramina remains patent. T10-11: Disc bulge with tiny central disc protrusion. Prior posterior decompression and fusion. No residual spinal stenosis. Probable mild left foraminal narrowing. Right neural foramen remains patent. T11-12: Disc bulge with broad-based right paracentral disc protrusion mildly flattens the ventral thecal sac. Mild facet hypertrophy. No significant spinal stenosis. Foramina remain patent. T12-L1:  Mild disc bulge.  No significant stenosis. MRI LUMBAR SPINE FINDINGS Segmentation: Standard. Lowest well-formed disc space labeled the L5-S1 level. Alignment: Stable alignment. Trace retrolisthesis of L1 on L2, with trace anterolisthesis of L2 on L3 and L4 on L5. 4 mm anterolisthesis of L3 on L4. Vertebrae: Vertebral body height maintained without acute or chronic fracture. Bone marrow signal intensity within normal limits. No discrete or worrisome osseous lesions. Postsurgical changes from prior posterior decompression with  fusion at L1-2. Fracturing of the right transpedicular screw at L2 better seen on prior CT. Patient also status post posterior decompression at L2-3 and L3-4. There is extensive edema and enhancement involving the right posterior paraspinous soft tissues extending from the L2-3 through L4-5 levels, concerning for acute infection/cellulitis (series 11, image 5). Changes emanate from the right L3-4 and L4-5 facets, concerning for possible septic arthritis. The right L2-3 facet may be involved superiorly as well, difficult to discern given adjacent susceptibility artifact. There is a superimposed rim enhancing collection extending posteriorly from the right L4-5 facet measuring 2.4 x 1.8 x 1.6 cm, consistent with probable abscess (series 8, image 21). Associated joint effusions within the adjacent right greater than left L4-5 facets. More mild inflammatory changes seen within the contralateral left posterior paraspinous  soft tissues as well. There is diffuse epidural enhancement about the lower thecal sac at the levels of L4-5 and L5-S1 without frank epidural abscess or collection (series 11, image 9). No other evidence for osteomyelitis discitis within the lumbar spine. Conus medullaris: Extends to the T12 level. No definite visible cord signal changes seen on this portion of the exam. Paraspinal and other soft tissues: Acute inflammatory changes within the right greater than left posterior paraspinous soft tissues as detailed above. Paraspinous soft tissues demonstrate no other acute finding. Asymmetric left renal atrophy noted. Disc levels: L1-2: Trace retrolisthesis with degenerative intervertebral disc space narrowing. Diffuse disc bulge with disc desiccation, eccentric to the right. Prior posterior decompression with fusion. No residual spinal stenosis. Foramina appear grossly patent. L2-3: Degenerative intervertebral disc space narrowing with mild diffuse disc bulge. Prior posterior decompression. Residual bilateral facet arthrosis. No significant spinal stenosis. Mild right L2 foraminal narrowing. L3-4: Anterolisthesis. Degenerative intervertebral disc space narrowing with diffuse disc bulge. Prior posterior decompression. Residual bilateral facet arthrosis. No significant spinal stenosis. Mild right L3 foraminal narrowing. Left neural foramina remains patent. L4-5: Diffuse disc bulge with disc desiccation, asymmetric to the left. Prominent left-sided reactive endplate change with marginal endplate osteophytic spurring. Moderate to advanced bilateral facet arthrosis with changes concerning for septic arthritis on the right. Mild epidural enhancement without frank collection. Resultant moderate canal with left lateral recess stenosis. Moderate left L4 foraminal narrowing. L5-S1: Mild disc bulge. Moderate bilateral facet hypertrophy. 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 Mild epidural enhancement without  frank collection. No other significant spinal stenosis. Foramina remain patent. IMPRESSION: 1. Acute inflammatory changes involving the right posterior paraspinous soft tissues of the lumbar spine, extending from the L2-3 through L4-5, concerning for acute infection. Changes are in close proximity to the L3-4 and L4-5 facets, concerning for possible septic arthritis. Superimposed 2.4 x 1.8 x 1.6 cm collection extending posteriorly from the right L4-5 facet suspicious for abscess. The right L2-3 facet may be involved superiorly as well, difficult to discern given adjacent susceptibility artifact at this level. 2. Associated diffuse epidural enhancement about the lower thecal sac at the levels of L4-5 and L5-S1 without frank epidural abscess. 3. No other evidence for acute infection elsewhere within the thoracolumbar spine. 4. Question focal cord signal abnormality involving the distal thoracic cord at the level of [id number], not entirely certain given adjacent susceptibility artifact from fusion hardware at this level. Correlation with physical exam for possible myelopathic symptoms recommended. 5. Underlying postsurgical changes from prior posterior decompression and fusion at T10-11 and L1-2, with additional posterior decompression at L2-3 and L3-4. Fracturing of the right transpedicular screw at the level of L2 better appreciated on prior CT.  6. Underlying multilevel thoracolumbar spondylosis as detailed above, most pronounced at the L4-5 level where there is resultant moderate canal with left lateral recess stenosis, with moderate left L4 foraminal narrowing. Electronically Signed   By: Jeannine Boga M.D.   On: 06/23/2021 05:38   MR Lumbar Spine W Wo Contrast  Result Date: 06/23/2021 CLINICAL DATA:  Initial evaluation for acute lower back pain, infection suspected. EXAM: MRI THORACIC AND LUMBAR SPINE WITHOUT AND WITH CONTRAST TECHNIQUE: Multiplanar and multiecho pulse sequences of the thoracic and lumbar  spine were obtained without and with intravenous contrast. CONTRAST:  63mL GADAVIST GADOBUTROL 1 MMOL/ML IV SOLN COMPARISON:  Prior CT from 06/23/2021. FINDINGS: MRI THORACIC SPINE FINDINGS Alignment: Physiologic with preservation of the normal thoracic kyphosis. Trace chronic retrolisthesis of T11 on T12 and T12 on L1. Vertebrae: Susceptibility artifact related to prior posterior fusion present at T10-11. Vertebral body height maintained without acute or chronic fracture. Bone marrow signal intensity within normal limits. No discrete or worrisome osseous lesions. No evidence for osteomyelitis discitis or septic arthritis within the thoracic spine. Cord: Questionable signal abnormality seen involving the distal cord at the level of T10-11 (series 22, images 32, 33), not entirely certain and could potentially be artifactual given adjacent fusion hardware. Otherwise, signal intensity within the visualized cord is normal. No abnormal enhancement. No epidural abscess or other collection. Paraspinal and other soft tissues: Chronic postoperative scarring present within the lower posterior paraspinous soft tissues. No acute paraspinous edema or inflammation. Asymmetric left renal atrophy partially visualized. Disc levels: Postsurgical changes from prior fusion at C3 through C6 noted within the cervical spine on counter sequence. T1-2: Minimal left eccentric disc bulge. Mild left-sided facet hypertrophy. No significant stenosis. T2-3: Tiny right paracentral disc protrusion mildly indents the right ventral thecal sac. No significant spinal stenosis. Foramina remain patent. T3-4: Small right paracentral disc protrusion minimally indents the right ventral thecal sac. Mild facet hypertrophy. No significant stenosis. T4-5: Negative interspace. Mild posterior element hypertrophy. No significant stenosis. T5-6: Negative interspace. Posterior element hypertrophy. No significant stenosis. T6-7: Central disc protrusion indents the  ventral thecal sac. Mild flattening of the ventral cord without cord signal changes. Prominence of the dorsal epidural fat with mild facet hypertrophy. No significant spinal stenosis. Foramina remain patent. T7-8: Minimal disc bulge. Left greater than right facet hypertrophy with prominence of the dorsal epidural fat. No significant stenosis. T8-9: Right paracentral disc protrusion indents the ventral thecal sac. Mild flattening of the right ventral cord without cord signal changes. Mild posterior element hypertrophy with prominence of the dorsal epidural fat. No significant stenosis. T9-10: Left paracentral to foraminal disc protrusion with slight superior migration. Bilateral facet hypertrophy. No significant spinal stenosis. Mild left foraminal narrowing. Right neural foramina remains patent. T10-11: Disc bulge with tiny central disc protrusion. Prior posterior decompression and fusion. No residual spinal stenosis. Probable mild left foraminal narrowing. Right neural foramen remains patent. T11-12: Disc bulge with broad-based right paracentral disc protrusion mildly flattens the ventral thecal sac. Mild facet hypertrophy. No significant spinal stenosis. Foramina remain patent. T12-L1:  Mild disc bulge.  No significant stenosis. MRI LUMBAR SPINE FINDINGS Segmentation: Standard. Lowest well-formed disc space labeled the L5-S1 level. Alignment: Stable alignment. Trace retrolisthesis of L1 on L2, with trace anterolisthesis of L2 on L3 and L4 on L5. 4 mm anterolisthesis of L3 on L4. Vertebrae: Vertebral body height maintained without acute or chronic fracture. Bone marrow signal intensity within normal limits. No discrete or worrisome osseous lesions. Postsurgical changes from prior posterior  decompression with fusion at L1-2. Fracturing of the right transpedicular screw at L2 better seen on prior CT. Patient also status post posterior decompression at L2-3 and L3-4. There is extensive edema and enhancement involving  the right posterior paraspinous soft tissues extending from the L2-3 through L4-5 levels, concerning for acute infection/cellulitis (series 11, image 5). Changes emanate from the right L3-4 and L4-5 facets, concerning for possible septic arthritis. The right L2-3 facet may be involved superiorly as well, difficult to discern given adjacent susceptibility artifact. There is a superimposed rim enhancing collection extending posteriorly from the right L4-5 facet measuring 2.4 x 1.8 x 1.6 cm, consistent with probable abscess (series 8, image 21). Associated joint effusions within the adjacent right greater than left L4-5 facets. More mild inflammatory changes seen within the contralateral left posterior paraspinous soft tissues as well. There is diffuse epidural enhancement about the lower thecal sac at the levels of L4-5 and L5-S1 without frank epidural abscess or collection (series 11, image 9). No other evidence for osteomyelitis discitis within the lumbar spine. Conus medullaris: Extends to the T12 level. No definite visible cord signal changes seen on this portion of the exam. Paraspinal and other soft tissues: Acute inflammatory changes within the right greater than left posterior paraspinous soft tissues as detailed above. Paraspinous soft tissues demonstrate no other acute finding. Asymmetric left renal atrophy noted. Disc levels: L1-2: Trace retrolisthesis with degenerative intervertebral disc space narrowing. Diffuse disc bulge with disc desiccation, eccentric to the right. Prior posterior decompression with fusion. No residual spinal stenosis. Foramina appear grossly patent. L2-3: Degenerative intervertebral disc space narrowing with mild diffuse disc bulge. Prior posterior decompression. Residual bilateral facet arthrosis. No significant spinal stenosis. Mild right L2 foraminal narrowing. L3-4: Anterolisthesis. Degenerative intervertebral disc space narrowing with diffuse disc bulge. Prior posterior  decompression. Residual bilateral facet arthrosis. No significant spinal stenosis. Mild right L3 foraminal narrowing. Left neural foramina remains patent. L4-5: Diffuse disc bulge with disc desiccation, asymmetric to the left. Prominent left-sided reactive endplate change with marginal endplate osteophytic spurring. Moderate to advanced bilateral facet arthrosis with changes concerning for septic arthritis on the right. Mild epidural enhancement without frank collection. Resultant moderate canal with left lateral recess stenosis. Moderate left L4 foraminal narrowing. L5-S1: Mild disc bulge. Moderate bilateral facet hypertrophy. 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 Mild epidural enhancement without frank collection. No other significant spinal stenosis. Foramina remain patent. IMPRESSION: 1. Acute inflammatory changes involving the right posterior paraspinous soft tissues of the lumbar spine, extending from the L2-3 through L4-5, concerning for acute infection. Changes are in close proximity to the L3-4 and L4-5 facets, concerning for possible septic arthritis. Superimposed 2.4 x 1.8 x 1.6 cm collection extending posteriorly from the right L4-5 facet suspicious for abscess. The right L2-3 facet may be involved superiorly as well, difficult to discern given adjacent susceptibility artifact at this level. 2. Associated diffuse epidural enhancement about the lower thecal sac at the levels of L4-5 and L5-S1 without frank epidural abscess. 3. No other evidence for acute infection elsewhere within the thoracolumbar spine. 4. Question focal cord signal abnormality involving the distal thoracic cord at the level of [id number], not entirely certain given adjacent susceptibility artifact from fusion hardware at this level. Correlation with physical exam for possible myelopathic symptoms recommended. 5. Underlying postsurgical changes from prior posterior decompression and fusion at T10-11 and L1-2, with  additional posterior decompression at L2-3 and L3-4. Fracturing of the right transpedicular screw at the level of L2 better appreciated  on prior CT. 6. Underlying multilevel thoracolumbar spondylosis as detailed above, most pronounced at the L4-5 level where there is resultant moderate canal with left lateral recess stenosis, with moderate left L4 foraminal narrowing. Electronically Signed   By: Jeannine Boga M.D.   On: 06/23/2021 05:38   CT ABDOMEN PELVIS W CONTRAST  Result Date: 06/23/2021 CLINICAL DATA:  Abdominal pain and fever. Numbness and tingling to the right leg since last night. No trauma. History of kidney infection. EXAM: CT ABDOMEN AND PELVIS WITH CONTRAST TECHNIQUE: Multidetector CT imaging of the abdomen and pelvis was performed using the standard protocol following bolus administration of intravenous contrast. CONTRAST:  24mL OMNIPAQUE IOHEXOL 350 MG/ML SOLN COMPARISON:  06/19/2021 FINDINGS: Lower chest: Lung bases are clear. Hepatobiliary: No focal liver abnormality is seen. Status post cholecystectomy. No biliary dilatation. Pancreas: Unremarkable. No pancreatic ductal dilatation or surrounding inflammatory changes. Spleen: Normal in size without focal abnormality. Adrenals/Urinary Tract: No adrenal gland nodules. Bilateral renal parenchymal scarring or lobulation with parenchymal atrophy involving the left kidney. No solid mass lesion identified. No hydronephrosis or hydroureter. Bladder is unremarkable. Stomach/Bowel: Stomach, small bowel, and colon are not abnormally distended. No wall thickening or inflammatory changes are appreciated. Appendix is normal. Vascular/Lymphatic: No significant vascular findings are present. No enlarged abdominal or pelvic lymph nodes. Reproductive: Status post hysterectomy. No adnexal masses. Other: No free air or free fluid in the abdomen. Abdominal wall musculature appears intact. Musculoskeletal: Postoperative changes with plate and screw fixations  from T10-T11 and L1-L2. The pedicular screw at L2 on the right is broken. This may have progressed since the previous study. Degenerative changes and mild scoliosis of the lumbar spine with convexity towards the left. IMPRESSION: 1. Asymmetric atrophy of the left kidney. 2. No acute abnormalities demonstrated in the abdomen or pelvis. No bowel obstruction or inflammation. No abscess identified. 3. Postoperative changes in the lumbar spine. The right L2 pedicular screw is broken, possibly progressed since prior study. Electronically Signed   By: Lucienne Capers M.D.   On: 06/23/2021 00:25   CT L-SPINE NO CHARGE  Result Date: 06/23/2021 CLINICAL DATA:  Initial evaluation for low back pain with right lower extremity numbness and tingling. Fevers. EXAM: CT LUMBAR SPINE WITHOUT CONTRAST TECHNIQUE: Multidetector CT imaging of the lumbar spine was performed without intravenous contrast administration. Multiplanar CT image reconstructions were also generated. COMPARISON:  MRI from 10/01/2019. FINDINGS: Segmentation: Standard. Lowest well-formed disc space labeled the L5-S1 level. Alignment: Sigmoid scoliotic curvature of the lumbar spine. Trace retrolisthesis of T12 on L1 and L1 on L2, with trace anterolisthesis of L2 on L3. 4 mm anterolisthesis of L3 on L4. Vertebrae: Vertebral body height maintained without acute or chronic fracture. Visualized sacrum and pelvis intact. No findings to suggest osteomyelitis discitis or septic arthritis. SI joints symmetric and within normal limits. Patient is status post posterior fusion at L1-2 with bilateral transpedicular screws in place. No periprosthetic lucency to suggest loosening or infection. The right-sided transpedicular screw at L2 is fractured (series 13, image 37). Hardware otherwise intact. Sequelae of prior screw removal noted at L3 and L4. Paraspinal and other soft tissues: Chronic postoperative changes present within the posterior paraspinous soft tissues. No visible  acute paraspinous inflammatory changes. No loculated collections. Partially visualized abdominal viscera better evaluated on concomitant CT of the abdomen and pelvis. Disc levels: L1-2: Trace retrolisthesis with degenerative intervertebral disc space narrowing and diffuse disc bulge. Prior posterior decompression and fusion. No residual spinal stenosis. No more than mild bilateral L1 foraminal  narrowing. L2-3: Degenerative intervertebral disc space narrowing with diffuse disc bulge, slightly eccentric to the right. Prior posterior decompression. Moderate bilateral facet arthrosis. No significant residual spinal stenosis. Moderate right worse than left L2 foraminal narrowing. L3-4: 4 mm anterolisthesis. Degenerative intervertebral disc space narrowing with right eccentric disc bulge. Superimposed right foraminal to extraforaminal disc protrusion contacts the exiting right L3 nerve root (series 10, image 59). Moderate bilateral facet arthrosis. Prior posterior decompression. No definite residual spinal stenosis. Mild left with moderate right L3 foraminal narrowing. L4-5: Degenerative intervertebral disc space narrowing with diffuse disc bulge and mild disc desiccation. Disc bulging appears to be asymmetric to the right. Mild reactive endplate spurring. Moderate facet and ligament flavum hypertrophy. Prior right hemi laminectomy. Persistent moderate to severe canal with bilateral lateral recess stenosis. Moderate left worse than right L4 foraminal stenosis. L5-S1: Shallow broad-based central disc protrusion with mild annular calcification (series 10, image 90). Bulging disc closely approximates both of the descending S1 nerve roots without frank impingement. Moderate bilateral facet arthrosis. No significant spinal stenosis. Mild-to-moderate bilateral L5 foraminal narrowing. IMPRESSION: 1. No CT evidence for acute abnormality within the lumbar spine. 2. Postoperative changes from prior posterior decompression and  fusion at L1-2 without residual spinal stenosis. The right transpedicular screw at L2 is fractured. No periprosthetic lucency to suggest loosening or infection. 3. Right foraminal to extraforaminal disc protrusion at L3-4, contacting and potentially affecting the exiting right L3 nerve root. 4. Multifactorial degenerative changes at L4-5 with resultant moderate to severe canal with bilateral lateral recess stenosis, with moderate left worse than right L4 foraminal stenosis. 5. Additional mild-to-moderate foraminal narrowing elsewhere within the lumbar spine at L2, L3, and L5 as above. Electronically Signed   By: Jeannine Boga M.D.   On: 06/23/2021 01:02    ROS: Per HPI  Blood pressure (!) 117/55, pulse 96, temperature 99.2 F (37.3 C), temperature source Oral, resp. rate (!) 30, height 5\' 7"  (1.702 m), weight 120.7 kg, SpO2 90 %.  Physical Exam:  Patient is awake, A/O X 4, conversant, and in good spirits. They are in NAD and VSS. Speech is fluent and appropriate. MAEW with good strength. SLR negative. Sensation to light touch is intact. PERLA, EOMI. CNs grossly intact.   Assessment/Plan: 50 y.o. female with c/o of increasing back pain and RLE radiculopathy. While in the ED, she was incidentally found to be positive for COVID-19. On examination she appears to be at her neurological baseline. MRI of the thoracic and lumbar spine were reviewed. She has acute inflammatory changes in the right paraspinous soft tissue of the lumbar spine and L2-L5 with septic arthritis. No acute neurosurgical intervention is indicated at this time. I would recommend medicine admit with IR aspiration of the septic joint. Antibiotics per primary. Her sacral fracture can continue to be treated conservatively.   Marvis Moeller, DNP, NP-C 06/23/2021, 9:53 AM

## 2021-06-23 NOTE — ED Notes (Signed)
Pt c/o right lower side/thigh/buttock pain, radiating down right leg. 7/10 pain w/ IV Abx for previous UTI dx this past weekend.

## 2021-06-24 DIAGNOSIS — A419 Sepsis, unspecified organism: Secondary | ICD-10-CM

## 2021-06-24 DIAGNOSIS — R6520 Severe sepsis without septic shock: Secondary | ICD-10-CM | POA: Diagnosis not present

## 2021-06-24 DIAGNOSIS — U071 COVID-19: Secondary | ICD-10-CM

## 2021-06-24 DIAGNOSIS — M4620 Osteomyelitis of vertebra, site unspecified: Secondary | ICD-10-CM

## 2021-06-24 DIAGNOSIS — L0291 Cutaneous abscess, unspecified: Secondary | ICD-10-CM | POA: Diagnosis not present

## 2021-06-24 LAB — CBC WITH DIFFERENTIAL/PLATELET
Abs Immature Granulocytes: 0.03 10*3/uL (ref 0.00–0.07)
Basophils Absolute: 0 10*3/uL (ref 0.0–0.1)
Basophils Relative: 0 %
Eosinophils Absolute: 0.1 10*3/uL (ref 0.0–0.5)
Eosinophils Relative: 1 %
HCT: 33.5 % — ABNORMAL LOW (ref 36.0–46.0)
Hemoglobin: 10.6 g/dL — ABNORMAL LOW (ref 12.0–15.0)
Immature Granulocytes: 0 %
Lymphocytes Relative: 27 %
Lymphs Abs: 2 10*3/uL (ref 0.7–4.0)
MCH: 27.2 pg (ref 26.0–34.0)
MCHC: 31.6 g/dL (ref 30.0–36.0)
MCV: 85.9 fL (ref 80.0–100.0)
Monocytes Absolute: 0.7 10*3/uL (ref 0.1–1.0)
Monocytes Relative: 10 %
Neutro Abs: 4.6 10*3/uL (ref 1.7–7.7)
Neutrophils Relative %: 62 %
Platelets: 263 10*3/uL (ref 150–400)
RBC: 3.9 MIL/uL (ref 3.87–5.11)
RDW: 13.5 % (ref 11.5–15.5)
WBC: 7.5 10*3/uL (ref 4.0–10.5)
nRBC: 0 % (ref 0.0–0.2)

## 2021-06-24 LAB — COMPREHENSIVE METABOLIC PANEL
ALT: 40 U/L (ref 0–44)
AST: 28 U/L (ref 15–41)
Albumin: 2.2 g/dL — ABNORMAL LOW (ref 3.5–5.0)
Alkaline Phosphatase: 105 U/L (ref 38–126)
Anion gap: 8 (ref 5–15)
BUN: 7 mg/dL (ref 6–20)
CO2: 26 mmol/L (ref 22–32)
Calcium: 8.3 mg/dL — ABNORMAL LOW (ref 8.9–10.3)
Chloride: 104 mmol/L (ref 98–111)
Creatinine, Ser: 0.77 mg/dL (ref 0.44–1.00)
GFR, Estimated: >60 mL/min (ref >60)
Glucose, Bld: 131 mg/dL — ABNORMAL HIGH (ref 70–99)
Potassium: 3.5 mmol/L (ref 3.5–5.1)
Sodium: 138 mmol/L (ref 135–145)
Total Bilirubin: 0.4 mg/dL (ref 0.3–1.2)
Total Protein: 6.4 g/dL — ABNORMAL LOW (ref 6.5–8.1)

## 2021-06-24 MED ORDER — NIRMATRELVIR/RITONAVIR (PAXLOVID)TABLET
3.0000 | ORAL_TABLET | Freq: Two times a day (BID) | ORAL | Status: AC
  Administered 2021-06-24 – 2021-06-28 (×9): 3 via ORAL
  Filled 2021-06-24 (×3): qty 30

## 2021-06-24 MED ORDER — ATORVASTATIN CALCIUM 10 MG PO TABS
10.0000 mg | ORAL_TABLET | Freq: Every day | ORAL | Status: DC
  Administered 2021-07-01: 10 mg via ORAL
  Filled 2021-06-24: qty 1

## 2021-06-24 NOTE — Progress Notes (Signed)
PROGRESS NOTE    Mary Chambers  PJA:[id number] DOB: 12-Jun-1971 DOA: 06/22/2021 PCP: [name], NP   Chief Complaint  Patient presents with   Back Pain   Fever   Emesis    Brief Narrative:  Mary Chambers is Mary Chambers 50 y.o. female with medical history significant of hypertension, nephrolithiasis, morbid obesity, and prior back surgery back in 2020 who presents with complaints of back pain.  She was seen in urgent care and had CT abdomen pelvis without acute abnormalities and was discharged home on keflex.  Subsequently developed worsening symptoms, radiating pain down her R leg.  Fevers.  MRI was concerning for septic arthritis of the spine with possible abscess.    She's been admitted for treatment of her septic arthritis of lumbar spine complicated by abscess and  enterococcus bacteremia.  S/p IR drainage.  ID and neurosurgery following.    Assessment & Plan:   Principal Problem:   Sepsis ([place]) Active Problems:   Hypertension, benign   Morbidly obese (HCC)   Septic arthritis (HCC)   Transaminitis   Abscess   COVID-19 virus infection   Suspected UTI   Vertebral osteomyelitis ([place])  Enterococcus Faecalis Bacteremia  Septic Arthritis of Lumbar Spine  Paraspinal Abscess  Sepsis:  Patient presents with complaints of back pain with radiation down her right leg and fever noted up to 101.2 F with heart rates elevated up to 119 meeting SIRS criteria.  Labs significant for ESR 98 and WBC 11.2.  - MRI T/L spine notable for findings concerning for septic arthritis and abscess, diffuse epidural enhancement about the lower thecal sac at the levels of L4-5 and L5-S1 without frank epidural abscess - blood cx with enterococcus faecalis - s/p IR aspiration of right posterior paraspinal abscess - pending culture with rare enterococcus faecalis  - ID c/s, appreciate recs  - repeat blood cx pending  - echo pending - may need TEE  - ampicillin  - NSGY c/s, appreciate  recs   COVID-19 infection: Acute.  Patient reports that she has had Deane Wattenbarger nonproductive cough for few days now.  Initially thought symptoms were secondary to sinuses.  Noted to be incidentally noted to be positive for COVID-19. -symptoms mild, discussed use of antiviral like paxlovid, she's interested and agreeable (discussed with pharmacy) -hold lipitor while treating -needs 10 day isolation   Suspect urinary tract infection: Patient reported having urinary urgency with intermittent incontinence and not able to make it to the bathroom in time.  Urinalysis was significant for large leukocytes, rare bacteria, and 21-50 WBCs. -Follow-up urine culture (none sent, will follow up) -Patient on empiric antibiotics as seen above   Transaminitis: Acute.  Labs from 9/18 noted alkaline phosphatase 215, AST 88, and ALT 224.   -Check LFTs and hepatitis panel (negative) - improving, follow - labs pending   Essential hypertension: Blood pressures are initially elevated at 161/90.  Home blood pressure medications include enalapril 10 mg daily, furosemide 20 mg daily, and hydrochlorothiazide 25 mg daily. -will need to stop loop or diuretic - currently on hold -Continue enalapril   Back pain with radicular: Acute on chronic.  Patient with prior history of lumbar fusion back in 2020.  Home medications included tramadol. -Pain medications as seen above   Hyperlipidemia -holding lipitor   Morbid obesity: BMI 41.66 kg per metered square.  Patient had been on phentermine in the outpatient setting. -Consider resuming phentermine in the outpatient setting  DVT prophylaxis: lovenox Code Status:full Family Communication:  none at bedside Disposition:   Status is: Inpatient  Remains inpatient appropriate because:Inpatient level of care appropriate due to severity of illness  Dispo: The patient is from: Home              Anticipated d/c is to: Home              Patient currently is not medically stable to  d/c.   Difficult to place patient No       Consultants:  IR NSGY  Procedures:  IMPRESSION: Successful and uncomplicated fluoroscopy guided fine-needle aspiration of [name] right posterior paraspinal abscess at the L4-5 level.  Antimicrobials:  Anti-infectives (From admission, onward)    Start     Dose/Rate Route Frequency Ordered Stop   06/24/21 0030  ampicillin (OMNIPEN) 2 g in sodium chloride 0.9 % 100 mL IVPB        2 g 300 mL/hr over 20 Minutes Intravenous Every 4 hours 06/23/21 2342     06/24/21 0000  cefTRIAXone (ROCEPHIN) 2 g in sodium chloride 0.9 % 100 mL IVPB  Status:  Discontinued        2 g 200 mL/hr over 30 Minutes Intravenous Every 24 hours 06/23/21 0753 06/23/21 2342   06/23/21 1930  metroNIDAZOLE (FLAGYL) IVPB 500 mg  Status:  Discontinued        500 mg 100 mL/hr over 60 Minutes Intravenous Every 12 hours 06/23/21 0753 06/23/21 2342   06/23/21 1000  vancomycin (VANCOCIN) IVPB 1000 mg/200 mL premix  Status:  Discontinued        1,000 mg 200 mL/hr over 60 Minutes Intravenous Every 12 hours 06/23/21 0954 06/23/21 2342   06/22/21 2330  vancomycin (VANCOCIN) IVPB 1000 mg/200 mL premix        1,000 mg 200 mL/hr over 60 Minutes Intravenous  Once 06/22/21 2319 06/23/21 0731   06/22/21 2330  metroNIDAZOLE (FLAGYL) IVPB 500 mg        500 mg 100 mL/hr over 60 Minutes Intravenous  Once 06/22/21 2319 06/23/21 0838   06/22/21 2330  cefTRIAXone (ROCEPHIN) 1 g in sodium chloride 0.9 % 100 mL IVPB        1 g 200 mL/hr over 30 Minutes Intravenous  Once 06/22/21 2319 06/23/21 0109          Subjective: C/o back pain Would like medication for covid   Objective: Vitals:   06/24/21 0027 06/24/21 0407 06/24/21 0857 06/24/21 1605  BP: 114/72 110/70 120/71 112/71  Pulse: 97 99 81 100  Resp: 17 18 18 18   Temp: 98.8 F (37.1 C) 98.1 F (36.7 C) 98.4 F (36.9 C)   TempSrc: Oral Oral Oral   SpO2: 96% 98% 99% 99%  Weight:      Height:        Intake/Output Summary (Last  24 hours) at 06/24/2021 1617 Last data filed at 06/24/2021 1300 Gross per 24 hour  Intake 1958.47 ml  Output 1900 ml  Net 58.47 ml   Filed Weights   06/22/21 1940  Weight: 120.7 kg    Examination:  General exam: Appears calm and comfortable, obese Respiratory system: unlabored or RA Cardiovascular system: RRR Gastrointestinal system: Abdomen is nondistended, soft and nontender.  Central nervous system: Alert and oriented. No focal neurological deficits. Extremities: no LEE Skin: No rashes, lesions or ulcers Psychiatry: Judgement and insight appear normal. Mood & affect appropriate.     Data Reviewed: I have personally reviewed following labs and imaging studies  CBC: Recent Labs  Lab  06/19/21 0125 06/22/21 2257  WBC 10.2 11.2*  NEUTROABS 6.0 7.2  HGB 12.9 12.3  HCT 39.0 38.6  MCV 83.0 83.7  PLT 195 284    Basic Metabolic Panel: Recent Labs  Lab 06/19/21 0125 06/22/21 2257  NA 136 139  K 3.5 3.7  CL 98 100  CO2 26 26  GLUCOSE 111* 96  BUN 16 16  CREATININE 0.88 0.89  CALCIUM 9.3 9.6    GFR: Estimated Creatinine Clearance: 101.7 mL/min (by C-G formula based on SCr of 0.89 mg/dL).  Liver Function Tests: Recent Labs  Lab 06/19/21 0125 06/23/21 1000  AST 88* 21  ALT 224* 50*  ALKPHOS 215* 128*  BILITOT 0.6 0.7  PROT 7.4 6.6  ALBUMIN 4.0 2.5*    CBG: No results for input(s): GLUCAP in the last 168 hours.   Recent Results (from the past 240 hour(s))  Resp Panel by RT-PCR (Flu Nameer Summer&B, Covid) Nasopharyngeal Swab     Status: Abnormal   Collection Time: 06/22/21 10:57 PM   Specimen: Nasopharyngeal Swab; Nasopharyngeal(NP) swabs in vial transport medium  Result Value Ref Range Status   SARS Coronavirus 2 by RT PCR POSITIVE (Tyri Elmore) NEGATIVE Final    Comment: RESULT CALLED TO, READ BACK BY AND VERIFIED WITH: Burgess Amor RN AT 0006 06/23/21 [place] (NOTE) SARS-CoV-2 target nucleic acids are DETECTED.  The SARS-CoV-2 RNA is generally detectable in upper  respiratory specimens during the acute phase of infection. Positive results are indicative of the presence of the identified virus, but do not rule out bacterial infection or co-infection with other pathogens not detected by the test. Clinical correlation with patient history and other diagnostic information is necessary to determine patient infection status. The expected result is Negative.  Fact Sheet for Patients: EntrepreneurPulse.com.au  Fact Sheet for Healthcare Providers: IncredibleEmployment.be  This test is not yet approved or cleared by the [place] FDA and  has been authorized for detection and/or diagnosis of SARS-CoV-2 by FDA under an Emergency Use Authorization (EUA).  This EUA will remain in effect (meaning this test can be  used) for the duration of  the COVID-19 declaration under Section 564(b)(1) of the Act, 21 U.S.C. section 360bbb-3(b)(1), unless the authorization is terminated or revoked sooner.     Influenza Gene [place] by PCR NEGATIVE NEGATIVE Final   Influenza B by PCR NEGATIVE NEGATIVE Final    Comment: (NOTE) The Xpert Xpress SARS-CoV-2/FLU/RSV plus assay is intended as an aid in the diagnosis of influenza from Nasopharyngeal swab specimens and should not be used as Jabree Rebert sole basis for treatment. Nasal washings and aspirates are unacceptable for Xpert Xpress SARS-CoV-2/FLU/RSV testing.  Fact Sheet for Patients: EntrepreneurPulse.com.au  Fact Sheet for Healthcare Providers: IncredibleEmployment.be  This test is not yet approved or cleared by the [place] FDA and has been authorized for detection and/or diagnosis of SARS-CoV-2 by FDA under an Emergency Use Authorization (EUA). This EUA will remain in effect (meaning this test can be used) for the duration of the COVID-19 declaration under Section 564(b)(1) of the Act, 21 U.S.C. section 360bbb-3(b)(1), unless the authorization is  terminated or revoked.  Performed at [facility], [address], [place], [place] [address]   Blood culture (routine x 2)     Status: Abnormal (Preliminary result)   Collection Time: 06/22/21 11:22 PM   Specimen: BLOOD  Result Value Ref Range Status   Specimen Description   Final    BLOOD BOTTLES DRAWN AEROBIC AND ANAEROBIC Performed at [facility], [phone]  [address] [place], [place] [address]    Special Requests   Final    Blood Culture adequate volume RIGHT ANTECUBITAL Performed at [facility], [place], [place] [address]    Culture  Setup Time   Final    GRAM POSITIVE COCCI IN BOTH AEROBIC AND ANAEROBIC BOTTLES CRITICAL RESULT CALLED TO, READ BACK BY AND VERIFIED WITH: PHARMD Peever PHAM 06/23/21@23 :25 BY TW    Culture (Rashawd Laskaris)  Final    ENTEROCOCCUS FAECALIS CULTURE REINCUBATED FOR BETTER GROWTH Performed at [facility], [place] [address], [place], [place] [address]    Report Status PENDING  Incomplete  Blood Culture ID Panel (Reflexed)     Status: Abnormal   Collection Time: 06/22/21 11:22 PM  Result Value Ref Range Status   Enterococcus faecalis DETECTED (Detrich Rakestraw) NOT DETECTED Final    Comment: CRITICAL RESULT CALLED TO, READ BACK BY AND VERIFIED WITH: PHARMD [place] 06/23/21@23 :25 BY TW    Enterococcus Faecium NOT DETECTED NOT DETECTED Final   Listeria monocytogenes NOT DETECTED NOT DETECTED Final   Staphylococcus species NOT DETECTED NOT DETECTED Final   Staphylococcus aureus (BCID) NOT DETECTED NOT DETECTED Final   Staphylococcus epidermidis NOT DETECTED NOT DETECTED Final   Staphylococcus lugdunensis NOT DETECTED NOT DETECTED Final   Streptococcus species NOT DETECTED NOT DETECTED Final   Streptococcus agalactiae NOT DETECTED NOT DETECTED Final   Streptococcus pneumoniae NOT DETECTED NOT DETECTED Final   Streptococcus pyogenes NOT DETECTED NOT DETECTED Final   Bulmaro Feagans.calcoaceticus-baumannii NOT  DETECTED NOT DETECTED Final   Bacteroides fragilis NOT DETECTED NOT DETECTED Final   Enterobacterales NOT DETECTED NOT DETECTED Final   Enterobacter cloacae complex NOT DETECTED NOT DETECTED Final   Escherichia coli NOT DETECTED NOT DETECTED Final   Klebsiella aerogenes NOT DETECTED NOT DETECTED Final   Klebsiella oxytoca NOT DETECTED NOT DETECTED Final   Klebsiella pneumoniae NOT DETECTED NOT DETECTED Final   Proteus species NOT DETECTED NOT DETECTED Final   Salmonella species NOT DETECTED NOT DETECTED Final   Serratia marcescens NOT DETECTED NOT DETECTED Final   Haemophilus influenzae NOT DETECTED NOT DETECTED Final   Neisseria meningitidis NOT DETECTED NOT DETECTED Final   Pseudomonas aeruginosa NOT DETECTED NOT DETECTED Final   Stenotrophomonas maltophilia NOT DETECTED NOT DETECTED Final   Candida albicans NOT DETECTED NOT DETECTED Final   Candida auris NOT DETECTED NOT DETECTED Final   Candida glabrata NOT DETECTED NOT DETECTED Final   Candida krusei NOT DETECTED NOT DETECTED Final   Candida parapsilosis NOT DETECTED NOT DETECTED Final   Candida tropicalis NOT DETECTED NOT DETECTED Final   Cryptococcus neoformans/gattii NOT DETECTED NOT DETECTED Final   Vancomycin resistance NOT DETECTED NOT DETECTED Final    Comment: Performed at [facility], [address] [place], [place] [address]  Blood culture (routine x 2)     Status: Abnormal (Preliminary result)   Collection Time: 06/22/21 11:35 PM   Specimen: BLOOD LEFT WRIST  Result Value Ref Range Status   Specimen Description BLOOD LEFT WRIST  Final   Special Requests   Final    BOTTLES DRAWN AEROBIC AND ANAEROBIC Blood Culture adequate volume   Culture  Setup Time   Final    GRAM POSITIVE COCCI IN BOTH AEROBIC AND ANAEROBIC BOTTLES CRITICAL VALUE NOTED.  VALUE IS CONSISTENT WITH PREVIOUSLY REPORTED AND CALLED VALUE. Performed at [facility], [place] [address] [place], [place] [address]    Culture ENTEROCOCCUS  FAECALIS (Tremane Spurgeon)  Final  Report Status PENDING  Incomplete  Aerobic/Anaerobic Culture w Gram Stain (surgical/deep wound)     Status: None (Preliminary result)   Collection Time: 06/23/21  2:52 PM   Specimen: Back; Tissue  Result Value Ref Range Status   Specimen Description ABSCESS  Final   Special Requests PARASPINAL L4 L5  Final   Gram Stain   Final    MODERATE WBC PRESENT,BOTH PMN AND MONONUCLEAR NO ORGANISMS SEEN    Culture   Final    RARE ENTEROCOCCUS FAECALIS SUSCEPTIBILITIES TO FOLLOW Performed at [facility], [address], [place], [place] [address]    Report Status PENDING  Incomplete         Radiology Studies: MR THORACIC SPINE W WO CONTRAST  Result Date: 06/23/2021 CLINICAL DATA:  Initial evaluation for acute lower back pain, infection suspected. EXAM: MRI THORACIC AND LUMBAR SPINE WITHOUT AND WITH CONTRAST TECHNIQUE: Multiplanar and multiecho pulse sequences of the thoracic and lumbar spine were obtained without and with intravenous contrast. CONTRAST:  84m GADAVIST GADOBUTROL 1 MMOL/ML IV SOLN COMPARISON:  Prior CT from 06/23/2021. FINDINGS: MRI THORACIC SPINE FINDINGS Alignment: Physiologic with preservation of the normal thoracic kyphosis. Trace chronic retrolisthesis of T11 on T12 and T12 on L1. Vertebrae: Susceptibility artifact related to prior posterior fusion present at T10-11. Vertebral body height maintained without acute or chronic fracture. Bone marrow signal intensity within normal limits. No discrete or worrisome osseous lesions. No evidence for osteomyelitis discitis or septic arthritis within the thoracic spine. Cord: Questionable signal abnormality seen involving the distal cord at the level of T10-11 (series 22, images 32, 33), not entirely certain and could potentially be artifactual given adjacent fusion hardware. Otherwise, signal intensity within the visualized cord is normal. No abnormal enhancement. No epidural abscess or other collection.  Paraspinal and other soft tissues: Chronic postoperative scarring present within the lower posterior paraspinous soft tissues. No acute paraspinous edema or inflammation. Asymmetric left renal atrophy partially visualized. Disc levels: Postsurgical changes from prior fusion at C3 through C6 noted within the cervical spine on counter sequence. T1-2: Minimal left eccentric disc bulge. Mild left-sided facet hypertrophy. No significant stenosis. T2-3: Tiny right paracentral disc protrusion mildly indents the right ventral thecal sac. No significant spinal stenosis. Foramina remain patent. T3-4: Small right paracentral disc protrusion minimally indents the right ventral thecal sac. Mild facet hypertrophy. No significant stenosis. T4-5: Negative interspace. Mild posterior element hypertrophy. No significant stenosis. T5-6: Negative interspace. Posterior element hypertrophy. No significant stenosis. T6-7: Central disc protrusion indents the ventral thecal sac. Mild flattening of the ventral cord without cord signal changes. Prominence of the dorsal epidural fat with mild facet hypertrophy. No significant spinal stenosis. Foramina remain patent. T7-8: Minimal disc bulge. Left greater than right facet hypertrophy with prominence of the dorsal epidural fat. No significant stenosis. T8-9: Right paracentral disc protrusion indents the ventral thecal sac. Mild flattening of the right ventral cord without cord signal changes. Mild posterior element hypertrophy with prominence of the dorsal epidural fat. No significant stenosis. T9-10: Left paracentral to foraminal disc protrusion with slight superior migration. Bilateral facet hypertrophy. No significant spinal stenosis. Mild left foraminal narrowing. Right neural foramina remains patent. T10-11: Disc bulge with tiny central disc protrusion. Prior posterior decompression and fusion. No residual spinal stenosis. Probable mild left foraminal narrowing. Right neural foramen remains  patent. T11-12: Disc bulge with broad-based right paracentral disc protrusion mildly flattens the ventral thecal sac. Mild facet hypertrophy. No significant spinal stenosis. Foramina remain patent. T12-L1:  Mild  disc bulge.  No significant stenosis. MRI LUMBAR SPINE FINDINGS Segmentation: Standard. Lowest well-formed disc space labeled the L5-S1 level. Alignment: Stable alignment. Trace retrolisthesis of L1 on L2, with trace anterolisthesis of L2 on L3 and L4 on L5. 4 mm anterolisthesis of L3 on L4. Vertebrae: Vertebral body height maintained without acute or chronic fracture. Bone marrow signal intensity within normal limits. No discrete or worrisome osseous lesions. Postsurgical changes from prior posterior decompression with fusion at L1-2. Fracturing of the right transpedicular screw at L2 better seen on prior CT. Patient also status post posterior decompression at L2-3 and L3-4. There is extensive edema and enhancement involving the right posterior paraspinous soft tissues extending from the L2-3 through L4-5 levels, concerning for acute infection/cellulitis (series 11, image 5). Changes emanate from the right L3-4 and L4-5 facets, concerning for possible septic arthritis. The right L2-3 facet may be involved superiorly as well, difficult to discern given adjacent susceptibility artifact. There is Rhenda [place] superimposed rim enhancing collection extending posteriorly from the right L4-5 facet measuring 2.4 x 1.8 x 1.6 cm, consistent with probable abscess (series 8, image 21). Associated joint effusions within the adjacent right greater than left L4-5 facets. More mild inflammatory changes seen within the contralateral left posterior paraspinous soft tissues as well. There is diffuse epidural enhancement about the lower thecal sac at the levels of L4-5 and L5-S1 without frank epidural abscess or collection (series 11, image 9). No other evidence for osteomyelitis discitis within the lumbar spine. Conus medullaris:  Extends to the T12 level. No definite visible cord signal changes seen on this portion of the exam. Paraspinal and other soft tissues: Acute inflammatory changes within the right greater than left posterior paraspinous soft tissues as detailed above. Paraspinous soft tissues demonstrate no other acute finding. Asymmetric left renal atrophy noted. Disc levels: L1-2: Trace retrolisthesis with degenerative intervertebral disc space narrowing. Diffuse disc bulge with disc desiccation, eccentric to the right. Prior posterior decompression with fusion. No residual spinal stenosis. Foramina appear grossly patent. L2-3: Degenerative intervertebral disc space narrowing with mild diffuse disc bulge. Prior posterior decompression. Residual bilateral facet arthrosis. No significant spinal stenosis. Mild right L2 foraminal narrowing. L3-4: Anterolisthesis. Degenerative intervertebral disc space narrowing with diffuse disc bulge. Prior posterior decompression. Residual bilateral facet arthrosis. No significant spinal stenosis. Mild right L3 foraminal narrowing. Left neural foramina remains patent. L4-5: Diffuse disc bulge with disc desiccation, asymmetric to the left. Prominent left-sided reactive endplate change with marginal endplate osteophytic spurring. Moderate to advanced bilateral facet arthrosis with changes concerning for septic arthritis on the right. Mild epidural enhancement without frank collection. Resultant moderate canal with left lateral recess stenosis. Moderate left L4 foraminal narrowing. L5-S1: Mild disc bulge. Moderate bilateral facet hypertrophy. 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 Mild epidural enhancement without frank collection. No other significant spinal stenosis. Foramina remain patent. IMPRESSION: 1. Acute inflammatory changes involving the right posterior paraspinous soft tissues of the lumbar spine, extending from the L2-3 through L4-5, concerning for acute infection. Changes  are in close proximity to the L3-4 and L4-5 facets, concerning for possible septic arthritis. Superimposed 2.4 x 1.8 x 1.6 cm collection extending posteriorly from the right L4-5 facet suspicious for abscess. The right L2-3 facet may be involved superiorly as well, difficult to discern given adjacent susceptibility artifact at this level. 2. Associated diffuse epidural enhancement about the lower thecal sac at the levels of L4-5 and L5-S1 without frank epidural abscess. 3. No other evidence for acute infection elsewhere within the thoracolumbar  spine. 4. Question focal cord signal abnormality involving the distal thoracic cord at the level of D62-22, not entirely certain given adjacent susceptibility artifact from fusion hardware at this level. Correlation with physical exam for possible myelopathic symptoms recommended. 5. Underlying postsurgical changes from prior posterior decompression and fusion at T10-11 and L1-2, with additional posterior decompression at L2-3 and L3-4. Fracturing of the right transpedicular screw at the level of L2 better appreciated on prior CT. 6. Underlying multilevel thoracolumbar spondylosis as detailed above, most pronounced at the L4-5 level where there is resultant moderate canal with left lateral recess stenosis, with moderate left L4 foraminal narrowing. Electronically Signed   By: Jeannine Boga M.D.   On: 06/23/2021 05:38   MR Lumbar Spine W Wo Contrast  Result Date: 06/23/2021 CLINICAL DATA:  Initial evaluation for acute lower back pain, infection suspected. EXAM: MRI THORACIC AND LUMBAR SPINE WITHOUT AND WITH CONTRAST TECHNIQUE: Multiplanar and multiecho pulse sequences of the thoracic and lumbar spine were obtained without and with intravenous contrast. CONTRAST:  52m GADAVIST GADOBUTROL 1 MMOL/ML IV SOLN COMPARISON:  Prior CT from 06/23/2021. FINDINGS: MRI THORACIC SPINE FINDINGS Alignment: Physiologic with preservation of the normal thoracic kyphosis. Trace chronic  retrolisthesis of T11 on T12 and T12 on L1. Vertebrae: Susceptibility artifact related to prior posterior fusion present at T10-11. Vertebral body height maintained without acute or chronic fracture. Bone marrow signal intensity within normal limits. No discrete or worrisome osseous lesions. No evidence for osteomyelitis discitis or septic arthritis within the thoracic spine. Cord: Questionable signal abnormality seen involving the distal cord at the level of T10-11 (series 22, images 32, 33), not entirely certain and could potentially be artifactual given adjacent fusion hardware. Otherwise, signal intensity within the visualized cord is normal. No abnormal enhancement. No epidural abscess or other collection. Paraspinal and other soft tissues: Chronic postoperative scarring present within the lower posterior paraspinous soft tissues. No acute paraspinous edema or inflammation. Asymmetric left renal atrophy partially visualized. Disc levels: Postsurgical changes from prior fusion at C3 through C6 noted within the cervical spine on counter sequence. T1-2: Minimal left eccentric disc bulge. Mild left-sided facet hypertrophy. No significant stenosis. T2-3: Tiny right paracentral disc protrusion mildly indents the right ventral thecal sac. No significant spinal stenosis. Foramina remain patent. T3-4: Small right paracentral disc protrusion minimally indents the right ventral thecal sac. Mild facet hypertrophy. No significant stenosis. T4-5: Negative interspace. Mild posterior element hypertrophy. No significant stenosis. T5-6: Negative interspace. Posterior element hypertrophy. No significant stenosis. T6-7: Central disc protrusion indents the ventral thecal sac. Mild flattening of the ventral cord without cord signal changes. Prominence of the dorsal epidural fat with mild facet hypertrophy. No significant spinal stenosis. Foramina remain patent. T7-8: Minimal disc bulge. Left greater than right facet hypertrophy with  prominence of the dorsal epidural fat. No significant stenosis. T8-9: Right paracentral disc protrusion indents the ventral thecal sac. Mild flattening of the right ventral cord without cord signal changes. Mild posterior element hypertrophy with prominence of the dorsal epidural fat. No significant stenosis. T9-10: Left paracentral to foraminal disc protrusion with slight superior migration. Bilateral facet hypertrophy. No significant spinal stenosis. Mild left foraminal narrowing. Right neural foramina remains patent. T10-11: Disc bulge with tiny central disc protrusion. Prior posterior decompression and fusion. No residual spinal stenosis. Probable mild left foraminal narrowing. Right neural foramen remains patent. T11-12: Disc bulge with broad-based right paracentral disc protrusion mildly flattens the ventral thecal sac. Mild facet hypertrophy. No significant spinal stenosis. Foramina remain patent. T12-L1:  Mild disc bulge.  No significant stenosis. MRI LUMBAR SPINE FINDINGS Segmentation: Standard. Lowest well-formed disc space labeled the L5-S1 level. Alignment: Stable alignment. Trace retrolisthesis of L1 on L2, with trace anterolisthesis of L2 on L3 and L4 on L5. 4 mm anterolisthesis of L3 on L4. Vertebrae: Vertebral body height maintained without acute or chronic fracture. Bone marrow signal intensity within normal limits. No discrete or worrisome osseous lesions. Postsurgical changes from prior posterior decompression with fusion at L1-2. Fracturing of the right transpedicular screw at L2 better seen on prior CT. Patient also status post posterior decompression at L2-3 and L3-4. There is extensive edema and enhancement involving the right posterior paraspinous soft tissues extending from the L2-3 through L4-5 levels, concerning for acute infection/cellulitis (series 11, image 5). Changes emanate from the right L3-4 and L4-5 facets, concerning for possible septic arthritis. The right L2-3 facet may be  involved superiorly as well, difficult to discern given adjacent susceptibility artifact. There is Susann Lawhorne superimposed rim enhancing collection extending posteriorly from the right L4-5 facet measuring 2.4 x 1.8 x 1.6 cm, consistent with probable abscess (series 8, image 21). Associated joint effusions within the adjacent right greater than left L4-5 facets. More mild inflammatory changes seen within the contralateral left posterior paraspinous soft tissues as well. There is diffuse epidural enhancement about the lower thecal sac at the levels of L4-5 and L5-S1 without frank epidural abscess or collection (series 11, image 9). No other evidence for osteomyelitis discitis within the lumbar spine. Conus medullaris: Extends to the T12 level. No definite visible cord signal changes seen on this portion of the exam. Paraspinal and other soft tissues: Acute inflammatory changes within the right greater than left posterior paraspinous soft tissues as detailed above. Paraspinous soft tissues demonstrate no other acute finding. Asymmetric left renal atrophy noted. Disc levels: L1-2: Trace retrolisthesis with degenerative intervertebral disc space narrowing. Diffuse disc bulge with disc desiccation, eccentric to the right. Prior posterior decompression with fusion. No residual spinal stenosis. Foramina appear grossly patent. L2-3: Degenerative intervertebral disc space narrowing with mild diffuse disc bulge. Prior posterior decompression. Residual bilateral facet arthrosis. No significant spinal stenosis. Mild right L2 foraminal narrowing. L3-4: Anterolisthesis. Degenerative intervertebral disc space narrowing with diffuse disc bulge. Prior posterior decompression. Residual bilateral facet arthrosis. No significant spinal stenosis. Mild right L3 foraminal narrowing. Left neural foramina remains patent. L4-5: Diffuse disc bulge with disc desiccation, asymmetric to the left. Prominent left-sided reactive endplate change with  marginal endplate osteophytic spurring. Moderate to advanced bilateral facet arthrosis with changes concerning for septic arthritis on the right. Mild epidural enhancement without frank collection. Resultant moderate canal with left lateral recess stenosis. Moderate left L4 foraminal narrowing. L5-S1: Mild disc bulge. Moderate bilateral facet hypertrophy. 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 Mild epidural enhancement without frank collection. No other significant spinal stenosis. Foramina remain patent. IMPRESSION: 1. Acute inflammatory changes involving the right posterior paraspinous soft tissues of the lumbar spine, extending from the L2-3 through L4-5, concerning for acute infection. Changes are in close proximity to the L3-4 and L4-5 facets, concerning for possible septic arthritis. Superimposed 2.4 x 1.8 x 1.6 cm collection extending posteriorly from the right L4-5 facet suspicious for abscess. The right L2-3 facet may be involved superiorly as well, difficult to discern given adjacent susceptibility artifact at this level. 2. Associated diffuse epidural enhancement about the lower thecal sac at the levels of L4-5 and L5-S1 without frank epidural abscess. 3. No other evidence for acute infection elsewhere within the  thoracolumbar spine. 4. Question focal cord signal abnormality involving the distal thoracic cord at the level of [id number], not entirely certain given adjacent susceptibility artifact from fusion hardware at this level. Correlation with physical exam for possible myelopathic symptoms recommended. 5. Underlying postsurgical changes from prior posterior decompression and fusion at T10-11 and L1-2, with additional posterior decompression at L2-3 and L3-4. Fracturing of the right transpedicular screw at the level of L2 better appreciated on prior CT. 6. Underlying multilevel thoracolumbar spondylosis as detailed above, most pronounced at the L4-5 level where there is resultant moderate  canal with left lateral recess stenosis, with moderate left L4 foraminal narrowing. Electronically Signed   By: Jeannine Boga M.D.   On: 06/23/2021 05:38   CT ABDOMEN PELVIS W CONTRAST  Result Date: 06/23/2021 CLINICAL DATA:  Abdominal pain and fever. Numbness and tingling to the right leg since last night. No trauma. History of kidney infection. EXAM: CT ABDOMEN AND PELVIS WITH CONTRAST TECHNIQUE: Multidetector CT imaging of the abdomen and pelvis was performed using the standard protocol following bolus administration of intravenous contrast. CONTRAST:  35m OMNIPAQUE IOHEXOL 350 MG/ML SOLN COMPARISON:  06/19/2021 FINDINGS: Lower chest: Lung bases are clear. Hepatobiliary: No focal liver abnormality is seen. Status post cholecystectomy. No biliary dilatation. Pancreas: Unremarkable. No pancreatic ductal dilatation or surrounding inflammatory changes. Spleen: Normal in size without focal abnormality. Adrenals/Urinary Tract: No adrenal gland nodules. Bilateral renal parenchymal scarring or lobulation with parenchymal atrophy involving the left kidney. No solid mass lesion identified. No hydronephrosis or hydroureter. Bladder is unremarkable. Stomach/Bowel: Stomach, small bowel, and colon are not abnormally distended. No wall thickening or inflammatory changes are appreciated. Appendix is normal. Vascular/Lymphatic: No significant vascular findings are present. No enlarged abdominal or pelvic lymph nodes. Reproductive: Status post hysterectomy. No adnexal masses. Other: No free air or free fluid in the abdomen. Abdominal wall musculature appears intact. Musculoskeletal: Postoperative changes with plate and screw fixations from T10-T11 and L1-L2. The pedicular screw at L2 on the right is broken. This may have progressed since the previous study. Degenerative changes and mild scoliosis of the lumbar spine with convexity towards the left. IMPRESSION: 1. Asymmetric atrophy of the left kidney. 2. No acute  abnormalities demonstrated in the abdomen or pelvis. No bowel obstruction or inflammation. No abscess identified. 3. Postoperative changes in the lumbar spine. The right L2 pedicular screw is broken, possibly progressed since prior study. Electronically Signed   By: WLucienne CapersM.D.   On: 06/23/2021 00:25   IR Fluoro Guide Ndl Plmt / BX  Result Date: 06/23/2021 INDICATION: 50year old female with past medical history significant for hypertension, nephrolithiasis and morbid obesity with history of recent L1-2 posterior spinal fusion. She presented to ED on 06/22/2021 complaining of back pain, right lower extremity radiculopathy, nausea/vomiting and fever. MRI of the lumbar spine reveal Mary Chambers right paraspinal abscess at the L4-5 level, adjacent to the corresponding facet joint. She comes to our service today for Danessa Mensch fluoroscopy guided fine-needle aspiration. EXAM: FLUOROSCOPY GUIDED RIGHT PARASPINAL ABSCESS FINE-NEEDLE ASPIRATION MEDICATIONS: The patient is currently admitted to the hospital and receiving intravenous antibiotics. The antibiotics were administered within an appropriate time frame prior to the initiation of the procedure. ANESTHESIA/SEDATION: The procedure was performed under local anesthesia COMPLICATIONS: None immediate. FLUOROSCOPY TIME:  36 seconds (DAP 5 mGy) PROCEDURE: Informed verbal consent was obtained from the patient, following COVID 19 guidelines, after Leroi Haque thorough discussion of the procedural risks, benefits and alternatives. All questions were addressed. Maximal Sterile Barrier Technique was utilized  including caps, mask, sterile gowns, sterile gloves, sterile drape, hand hygiene and skin antiseptic. Noele Icenhour timeout was performed prior to the initiation of the procedure. The patient was made to lie prone on the angiography table. The skin in the lower back was prepped and draped in Kingson Lohmeyer sterile fashion. The L4-5 level was marked under fluoroscopy. The skin was infiltrated with lidocaine 1%  approximally 2 cm to the right of midline at the L4-5 level. Subsequently, Ozell Ferrera 20 gauge needle was advanced through the soft tissues for approximately 5 cm. Then, approximately 4 cc of blood-tinged purulent fluid was aspirated. The fluid was sent in capped syringes to the laboratory for Gram stain and culture. The needle was subsequently withdrawn. The skin was cleaned and dressed with Kemi Gell sterile bandage. IMPRESSION: Successful and uncomplicated fluoroscopy guided fine-needle aspiration of Mancel Lardizabal right posterior paraspinal abscess at the L4-5 level. Electronically Signed   By: Pedro Earls M.D.   On: 06/23/2021 15:25   CT L-SPINE NO CHARGE  Result Date: 06/23/2021 CLINICAL DATA:  Initial evaluation for low back pain with right lower extremity numbness and tingling. Fevers. EXAM: CT LUMBAR SPINE WITHOUT CONTRAST TECHNIQUE: Multidetector CT imaging of the lumbar spine was performed without intravenous contrast administration. Multiplanar CT image reconstructions were also generated. COMPARISON:  MRI from 10/01/2019. FINDINGS: Segmentation: Standard. Lowest well-formed disc space labeled the L5-S1 level. Alignment: Sigmoid scoliotic curvature of the lumbar spine. Trace retrolisthesis of T12 on L1 and L1 on L2, with trace anterolisthesis of L2 on L3. 4 mm anterolisthesis of L3 on L4. Vertebrae: Vertebral body height maintained without acute or chronic fracture. Visualized sacrum and pelvis intact. No findings to suggest osteomyelitis discitis or septic arthritis. SI joints symmetric and within normal limits. Patient is status post posterior fusion at L1-2 with bilateral transpedicular screws in place. No periprosthetic lucency to suggest loosening or infection. The right-sided transpedicular screw at L2 is fractured (series 13, image 37). Hardware otherwise intact. Sequelae of prior screw removal noted at L3 and L4. Paraspinal and other soft tissues: Chronic postoperative changes present within the  posterior paraspinous soft tissues. No visible acute paraspinous inflammatory changes. No loculated collections. Partially visualized abdominal viscera better evaluated on concomitant CT of the abdomen and pelvis. Disc levels: L1-2: Trace retrolisthesis with degenerative intervertebral disc space narrowing and diffuse disc bulge. Prior posterior decompression and fusion. No residual spinal stenosis. No more than mild bilateral L1 foraminal narrowing. L2-3: Degenerative intervertebral disc space narrowing with diffuse disc bulge, slightly eccentric to the right. Prior posterior decompression. Moderate bilateral facet arthrosis. No significant residual spinal stenosis. Moderate right worse than left L2 foraminal narrowing. L3-4: 4 mm anterolisthesis. Degenerative intervertebral disc space narrowing with right eccentric disc bulge. Superimposed right foraminal to extraforaminal disc protrusion contacts the exiting right L3 nerve root (series 10, image 59). Moderate bilateral facet arthrosis. Prior posterior decompression. No definite residual spinal stenosis. Mild left with moderate right L3 foraminal narrowing. L4-5: Degenerative intervertebral disc space narrowing with diffuse disc bulge and mild disc desiccation. Disc bulging appears to be asymmetric to the right. Mild reactive endplate spurring. Moderate facet and ligament flavum hypertrophy. Prior right hemi laminectomy. Persistent moderate to severe canal with bilateral lateral recess stenosis. Moderate left worse than right L4 foraminal stenosis. L5-S1: Shallow broad-based central disc protrusion with mild annular calcification (series 10, image 90). Bulging disc closely approximates both of the descending S1 nerve roots without frank impingement. Moderate bilateral facet arthrosis. No significant spinal stenosis. Mild-to-moderate bilateral L5 foraminal narrowing.  IMPRESSION: 1. No CT evidence for acute abnormality within the lumbar spine. 2. Postoperative  changes from prior posterior decompression and fusion at L1-2 without residual spinal stenosis. The right transpedicular screw at L2 is fractured. No periprosthetic lucency to suggest loosening or infection. 3. Right foraminal to extraforaminal disc protrusion at L3-4, contacting and potentially affecting the exiting right L3 nerve root. 4. Multifactorial degenerative changes at L4-5 with resultant moderate to severe canal with bilateral lateral recess stenosis, with moderate left worse than right L4 foraminal stenosis. 5. Additional mild-to-moderate foraminal narrowing elsewhere within the lumbar spine at L2, L3, and L5 as above. Electronically Signed   By: Jeannine Boga M.D.   On: 06/23/2021 01:02   Portable chest 1 View  Result Date: 06/23/2021 CLINICAL DATA:  Shortness of breath EXAM: PORTABLE CHEST 1 VIEW COMPARISON:  Chest radiograph 04/22/2020 FINDINGS: The cardiomediastinal silhouette is normal. Lung volumes are mildly low. There is no focal consolidation or pulmonary edema. There is no pleural effusion or pneumothorax. There is no acute osseous abnormality. Spinal fusion hardware is noted. IMPRESSION: Low lung volumes. Otherwise, no radiographic evidence of acute cardiopulmonary process. Electronically Signed   By: Valetta Mole M.D.   On: 06/23/2021 10:08        Scheduled Meds:  vitamin C  500 mg Oral Daily   atorvastatin  10 mg Oral Daily   enalapril  10 mg Oral Daily   enoxaparin (LOVENOX) injection  40 mg Subcutaneous Q24H   pantoprazole  40 mg Oral Daily   sodium chloride flush  3 mL Intravenous Q12H   zinc sulfate  220 mg Oral Daily   Continuous Infusions:  sodium chloride 50 mL/hr at 06/24/21 0700   ampicillin (OMNIPEN) IV 2 g (06/24/21 1519)     LOS: 1 day    Time spent: over 30 min    Fayrene Helper, MD [facility]   To contact the attending provider between 7A-7P or the covering provider during after hours 7P-7A, please log into the web site  [email or web address] and access using universal [place] password for that web site. If you do not have the password, please call the hospital operator.  06/24/2021, 4:17 PM

## 2021-06-24 NOTE — Progress Notes (Signed)
Subjective: Patient reports that her symptoms persist unchanged. She stated "my night was rough." No acute events were reported.   Objective: Vital signs in last 24 hours: Temp:  [98.1 F (36.7 C)-99.6 F (37.6 C)] 98.4 F (36.9 C) (09/23 0857) Pulse Rate:  [81-99] 81 (09/23 0857) Resp:  [17-30] 18 (09/23 0857) BP: (107-146)/(55-83) 120/71 (09/23 0857) SpO2:  [90 %-99 %] 99 % (09/23 0857)  Intake/Output from previous day: 09/22 0701 - 09/23 0700 In: 1911.5 [P.O.:540; I.V.:577.4; IV Piggyback:794.1] Out: 1050 [Urine:1050] Intake/Output this shift: Total I/O In: -  Out: 400 [Urine:400]  Physical Exam: Patient is awake, A/O X 4, conversant, and in good spirits. They are in NAD and VSS. Speech is fluent and appropriate. MAEW with good strength. Sensation to light touch is intact. PERLA, EOMI. CNs grossly intact.    Lab Results: Recent Labs    06/22/21 2257  WBC 11.2*  HGB 12.3  HCT 38.6  PLT 243   BMET Recent Labs    06/22/21 2257  NA 139  K 3.7  CL 100  CO2 26  GLUCOSE 96  BUN 16  CREATININE 0.89  CALCIUM 9.6    Studies/Results: MR THORACIC SPINE W WO CONTRAST  Result Date: 06/23/2021 CLINICAL DATA:  Initial evaluation for acute lower back pain, infection suspected. EXAM: MRI THORACIC AND LUMBAR SPINE WITHOUT AND WITH CONTRAST TECHNIQUE: Multiplanar and multiecho pulse sequences of the thoracic and lumbar spine were obtained without and with intravenous contrast. CONTRAST:  78mL GADAVIST GADOBUTROL 1 MMOL/ML IV SOLN COMPARISON:  Prior CT from 06/23/2021. FINDINGS: MRI THORACIC SPINE FINDINGS Alignment: Physiologic with preservation of the normal thoracic kyphosis. Trace chronic retrolisthesis of T11 on T12 and T12 on L1. Vertebrae: Susceptibility artifact related to prior posterior fusion present at T10-11. Vertebral body height maintained without acute or chronic fracture. Bone marrow signal intensity within normal limits. No discrete or worrisome osseous lesions. No  evidence for osteomyelitis discitis or septic arthritis within the thoracic spine. Cord: Questionable signal abnormality seen involving the distal cord at the level of T10-11 (series 22, images 32, 33), not entirely certain and could potentially be artifactual given adjacent fusion hardware. Otherwise, signal intensity within the visualized cord is normal. No abnormal enhancement. No epidural abscess or other collection. Paraspinal and other soft tissues: Chronic postoperative scarring present within the lower posterior paraspinous soft tissues. No acute paraspinous edema or inflammation. Asymmetric left renal atrophy partially visualized. Disc levels: Postsurgical changes from prior fusion at C3 through C6 noted within the cervical spine on counter sequence. T1-2: Minimal left eccentric disc bulge. Mild left-sided facet hypertrophy. No significant stenosis. T2-3: Tiny right paracentral disc protrusion mildly indents the right ventral thecal sac. No significant spinal stenosis. Foramina remain patent. T3-4: Small right paracentral disc protrusion minimally indents the right ventral thecal sac. Mild facet hypertrophy. No significant stenosis. T4-5: Negative interspace. Mild posterior element hypertrophy. No significant stenosis. T5-6: Negative interspace. Posterior element hypertrophy. No significant stenosis. T6-7: Central disc protrusion indents the ventral thecal sac. Mild flattening of the ventral cord without cord signal changes. Prominence of the dorsal epidural fat with mild facet hypertrophy. No significant spinal stenosis. Foramina remain patent. T7-8: Minimal disc bulge. Left greater than right facet hypertrophy with prominence of the dorsal epidural fat. No significant stenosis. T8-9: Right paracentral disc protrusion indents the ventral thecal sac. Mild flattening of the right ventral cord without cord signal changes. Mild posterior element hypertrophy with prominence of the dorsal epidural fat. No  significant stenosis. T9-10: Left  paracentral to foraminal disc protrusion with slight superior migration. Bilateral facet hypertrophy. No significant spinal stenosis. Mild left foraminal narrowing. Right neural foramina remains patent. T10-11: Disc bulge with tiny central disc protrusion. Prior posterior decompression and fusion. No residual spinal stenosis. Probable mild left foraminal narrowing. Right neural foramen remains patent. T11-12: Disc bulge with broad-based right paracentral disc protrusion mildly flattens the ventral thecal sac. Mild facet hypertrophy. No significant spinal stenosis. Foramina remain patent. T12-L1:  Mild disc bulge.  No significant stenosis. MRI LUMBAR SPINE FINDINGS Segmentation: Standard. Lowest well-formed disc space labeled the L5-S1 level. Alignment: Stable alignment. Trace retrolisthesis of L1 on L2, with trace anterolisthesis of L2 on L3 and L4 on L5. 4 mm anterolisthesis of L3 on L4. Vertebrae: Vertebral body height maintained without acute or chronic fracture. Bone marrow signal intensity within normal limits. No discrete or worrisome osseous lesions. Postsurgical changes from prior posterior decompression with fusion at L1-2. Fracturing of the right transpedicular screw at L2 better seen on prior CT. Patient also status post posterior decompression at L2-3 and L3-4. There is extensive edema and enhancement involving the right posterior paraspinous soft tissues extending from the L2-3 through L4-5 levels, concerning for acute infection/cellulitis (series 11, image 5). Changes emanate from the right L3-4 and L4-5 facets, concerning for possible septic arthritis. The right L2-3 facet may be involved superiorly as well, difficult to discern given adjacent susceptibility artifact. There is a superimposed rim enhancing collection extending posteriorly from the right L4-5 facet measuring 2.4 x 1.8 x 1.6 cm, consistent with probable abscess (series 8, image 21). Associated joint  effusions within the adjacent right greater than left L4-5 facets. More mild inflammatory changes seen within the contralateral left posterior paraspinous soft tissues as well. There is diffuse epidural enhancement about the lower thecal sac at the levels of L4-5 and L5-S1 without frank epidural abscess or collection (series 11, image 9). No other evidence for osteomyelitis discitis within the lumbar spine. Conus medullaris: Extends to the T12 level. No definite visible cord signal changes seen on this portion of the exam. Paraspinal and other soft tissues: Acute inflammatory changes within the right greater than left posterior paraspinous soft tissues as detailed above. Paraspinous soft tissues demonstrate no other acute finding. Asymmetric left renal atrophy noted. Disc levels: L1-2: Trace retrolisthesis with degenerative intervertebral disc space narrowing. Diffuse disc bulge with disc desiccation, eccentric to the right. Prior posterior decompression with fusion. No residual spinal stenosis. Foramina appear grossly patent. L2-3: Degenerative intervertebral disc space narrowing with mild diffuse disc bulge. Prior posterior decompression. Residual bilateral facet arthrosis. No significant spinal stenosis. Mild right L2 foraminal narrowing. L3-4: Anterolisthesis. Degenerative intervertebral disc space narrowing with diffuse disc bulge. Prior posterior decompression. Residual bilateral facet arthrosis. No significant spinal stenosis. Mild right L3 foraminal narrowing. Left neural foramina remains patent. L4-5: Diffuse disc bulge with disc desiccation, asymmetric to the left. Prominent left-sided reactive endplate change with marginal endplate osteophytic spurring. Moderate to advanced bilateral facet arthrosis with changes concerning for septic arthritis on the right. Mild epidural enhancement without frank collection. Resultant moderate canal with left lateral recess stenosis. Moderate left L4 foraminal narrowing.  L5-S1: Mild disc bulge. Moderate bilateral facet hypertrophy. 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 Mild epidural enhancement without frank collection. No other significant spinal stenosis. Foramina remain patent. IMPRESSION: 1. Acute inflammatory changes involving the right posterior paraspinous soft tissues of the lumbar spine, extending from the L2-3 through L4-5, concerning for acute infection. Changes are in close proximity to  the L3-4 and L4-5 facets, concerning for possible septic arthritis. Superimposed 2.4 x 1.8 x 1.6 cm collection extending posteriorly from the right L4-5 facet suspicious for abscess. The right L2-3 facet may be involved superiorly as well, difficult to discern given adjacent susceptibility artifact at this level. 2. Associated diffuse epidural enhancement about the lower thecal sac at the levels of L4-5 and L5-S1 without frank epidural abscess. 3. No other evidence for acute infection elsewhere within the thoracolumbar spine. 4. Question focal cord signal abnormality involving the distal thoracic cord at the level of [id number], not entirely certain given adjacent susceptibility artifact from fusion hardware at this level. Correlation with physical exam for possible myelopathic symptoms recommended. 5. Underlying postsurgical changes from prior posterior decompression and fusion at T10-11 and L1-2, with additional posterior decompression at L2-3 and L3-4. Fracturing of the right transpedicular screw at the level of L2 better appreciated on prior CT. 6. Underlying multilevel thoracolumbar spondylosis as detailed above, most pronounced at the L4-5 level where there is resultant moderate canal with left lateral recess stenosis, with moderate left L4 foraminal narrowing. Electronically Signed   By: Jeannine Boga M.D.   On: 06/23/2021 05:38   MR Lumbar Spine W Wo Contrast  Result Date: 06/23/2021 CLINICAL DATA:  Initial evaluation for acute lower back pain, infection  suspected. EXAM: MRI THORACIC AND LUMBAR SPINE WITHOUT AND WITH CONTRAST TECHNIQUE: Multiplanar and multiecho pulse sequences of the thoracic and lumbar spine were obtained without and with intravenous contrast. CONTRAST:  90mL GADAVIST GADOBUTROL 1 MMOL/ML IV SOLN COMPARISON:  Prior CT from 06/23/2021. FINDINGS: MRI THORACIC SPINE FINDINGS Alignment: Physiologic with preservation of the normal thoracic kyphosis. Trace chronic retrolisthesis of T11 on T12 and T12 on L1. Vertebrae: Susceptibility artifact related to prior posterior fusion present at T10-11. Vertebral body height maintained without acute or chronic fracture. Bone marrow signal intensity within normal limits. No discrete or worrisome osseous lesions. No evidence for osteomyelitis discitis or septic arthritis within the thoracic spine. Cord: Questionable signal abnormality seen involving the distal cord at the level of T10-11 (series 22, images 32, 33), not entirely certain and could potentially be artifactual given adjacent fusion hardware. Otherwise, signal intensity within the visualized cord is normal. No abnormal enhancement. No epidural abscess or other collection. Paraspinal and other soft tissues: Chronic postoperative scarring present within the lower posterior paraspinous soft tissues. No acute paraspinous edema or inflammation. Asymmetric left renal atrophy partially visualized. Disc levels: Postsurgical changes from prior fusion at C3 through C6 noted within the cervical spine on counter sequence. T1-2: Minimal left eccentric disc bulge. Mild left-sided facet hypertrophy. No significant stenosis. T2-3: Tiny right paracentral disc protrusion mildly indents the right ventral thecal sac. No significant spinal stenosis. Foramina remain patent. T3-4: Small right paracentral disc protrusion minimally indents the right ventral thecal sac. Mild facet hypertrophy. No significant stenosis. T4-5: Negative interspace. Mild posterior element hypertrophy.  No significant stenosis. T5-6: Negative interspace. Posterior element hypertrophy. No significant stenosis. T6-7: Central disc protrusion indents the ventral thecal sac. Mild flattening of the ventral cord without cord signal changes. Prominence of the dorsal epidural fat with mild facet hypertrophy. No significant spinal stenosis. Foramina remain patent. T7-8: Minimal disc bulge. Left greater than right facet hypertrophy with prominence of the dorsal epidural fat. No significant stenosis. T8-9: Right paracentral disc protrusion indents the ventral thecal sac. Mild flattening of the right ventral cord without cord signal changes. Mild posterior element hypertrophy with prominence of the dorsal epidural fat. No significant stenosis.  T9-10: Left paracentral to foraminal disc protrusion with slight superior migration. Bilateral facet hypertrophy. No significant spinal stenosis. Mild left foraminal narrowing. Right neural foramina remains patent. T10-11: Disc bulge with tiny central disc protrusion. Prior posterior decompression and fusion. No residual spinal stenosis. Probable mild left foraminal narrowing. Right neural foramen remains patent. T11-12: Disc bulge with broad-based right paracentral disc protrusion mildly flattens the ventral thecal sac. Mild facet hypertrophy. No significant spinal stenosis. Foramina remain patent. T12-L1:  Mild disc bulge.  No significant stenosis. MRI LUMBAR SPINE FINDINGS Segmentation: Standard. Lowest well-formed disc space labeled the L5-S1 level. Alignment: Stable alignment. Trace retrolisthesis of L1 on L2, with trace anterolisthesis of L2 on L3 and L4 on L5. 4 mm anterolisthesis of L3 on L4. Vertebrae: Vertebral body height maintained without acute or chronic fracture. Bone marrow signal intensity within normal limits. No discrete or worrisome osseous lesions. Postsurgical changes from prior posterior decompression with fusion at L1-2. Fracturing of the right transpedicular screw  at L2 better seen on prior CT. Patient also status post posterior decompression at L2-3 and L3-4. There is extensive edema and enhancement involving the right posterior paraspinous soft tissues extending from the L2-3 through L4-5 levels, concerning for acute infection/cellulitis (series 11, image 5). Changes emanate from the right L3-4 and L4-5 facets, concerning for possible septic arthritis. The right L2-3 facet may be involved superiorly as well, difficult to discern given adjacent susceptibility artifact. There is a superimposed rim enhancing collection extending posteriorly from the right L4-5 facet measuring 2.4 x 1.8 x 1.6 cm, consistent with probable abscess (series 8, image 21). Associated joint effusions within the adjacent right greater than left L4-5 facets. More mild inflammatory changes seen within the contralateral left posterior paraspinous soft tissues as well. There is diffuse epidural enhancement about the lower thecal sac at the levels of L4-5 and L5-S1 without frank epidural abscess or collection (series 11, image 9). No other evidence for osteomyelitis discitis within the lumbar spine. Conus medullaris: Extends to the T12 level. No definite visible cord signal changes seen on this portion of the exam. Paraspinal and other soft tissues: Acute inflammatory changes within the right greater than left posterior paraspinous soft tissues as detailed above. Paraspinous soft tissues demonstrate no other acute finding. Asymmetric left renal atrophy noted. Disc levels: L1-2: Trace retrolisthesis with degenerative intervertebral disc space narrowing. Diffuse disc bulge with disc desiccation, eccentric to the right. Prior posterior decompression with fusion. No residual spinal stenosis. Foramina appear grossly patent. L2-3: Degenerative intervertebral disc space narrowing with mild diffuse disc bulge. Prior posterior decompression. Residual bilateral facet arthrosis. No significant spinal stenosis. Mild  right L2 foraminal narrowing. L3-4: Anterolisthesis. Degenerative intervertebral disc space narrowing with diffuse disc bulge. Prior posterior decompression. Residual bilateral facet arthrosis. No significant spinal stenosis. Mild right L3 foraminal narrowing. Left neural foramina remains patent. L4-5: Diffuse disc bulge with disc desiccation, asymmetric to the left. Prominent left-sided reactive endplate change with marginal endplate osteophytic spurring. Moderate to advanced bilateral facet arthrosis with changes concerning for septic arthritis on the right. Mild epidural enhancement without frank collection. Resultant moderate canal with left lateral recess stenosis. Moderate left L4 foraminal narrowing. L5-S1: Mild disc bulge. Moderate bilateral facet hypertrophy. 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 Mild epidural enhancement without frank collection. No other significant spinal stenosis. Foramina remain patent. IMPRESSION: 1. Acute inflammatory changes involving the right posterior paraspinous soft tissues of the lumbar spine, extending from the L2-3 through L4-5, concerning for acute infection. Changes are in close  proximity to the L3-4 and L4-5 facets, concerning for possible septic arthritis. Superimposed 2.4 x 1.8 x 1.6 cm collection extending posteriorly from the right L4-5 facet suspicious for abscess. The right L2-3 facet may be involved superiorly as well, difficult to discern given adjacent susceptibility artifact at this level. 2. Associated diffuse epidural enhancement about the lower thecal sac at the levels of L4-5 and L5-S1 without frank epidural abscess. 3. No other evidence for acute infection elsewhere within the thoracolumbar spine. 4. Question focal cord signal abnormality involving the distal thoracic cord at the level of [id number], not entirely certain given adjacent susceptibility artifact from fusion hardware at this level. Correlation with physical exam for possible  myelopathic symptoms recommended. 5. Underlying postsurgical changes from prior posterior decompression and fusion at T10-11 and L1-2, with additional posterior decompression at L2-3 and L3-4. Fracturing of the right transpedicular screw at the level of L2 better appreciated on prior CT. 6. Underlying multilevel thoracolumbar spondylosis as detailed above, most pronounced at the L4-5 level where there is resultant moderate canal with left lateral recess stenosis, with moderate left L4 foraminal narrowing. Electronically Signed   By: Jeannine Boga M.D.   On: 06/23/2021 05:38   CT ABDOMEN PELVIS W CONTRAST  Result Date: 06/23/2021 CLINICAL DATA:  Abdominal pain and fever. Numbness and tingling to the right leg since last night. No trauma. History of kidney infection. EXAM: CT ABDOMEN AND PELVIS WITH CONTRAST TECHNIQUE: Multidetector CT imaging of the abdomen and pelvis was performed using the standard protocol following bolus administration of intravenous contrast. CONTRAST:  66mL OMNIPAQUE IOHEXOL 350 MG/ML SOLN COMPARISON:  06/19/2021 FINDINGS: Lower chest: Lung bases are clear. Hepatobiliary: No focal liver abnormality is seen. Status post cholecystectomy. No biliary dilatation. Pancreas: Unremarkable. No pancreatic ductal dilatation or surrounding inflammatory changes. Spleen: Normal in size without focal abnormality. Adrenals/Urinary Tract: No adrenal gland nodules. Bilateral renal parenchymal scarring or lobulation with parenchymal atrophy involving the left kidney. No solid mass lesion identified. No hydronephrosis or hydroureter. Bladder is unremarkable. Stomach/Bowel: Stomach, small bowel, and colon are not abnormally distended. No wall thickening or inflammatory changes are appreciated. Appendix is normal. Vascular/Lymphatic: No significant vascular findings are present. No enlarged abdominal or pelvic lymph nodes. Reproductive: Status post hysterectomy. No adnexal masses. Other: No free air or  free fluid in the abdomen. Abdominal wall musculature appears intact. Musculoskeletal: Postoperative changes with plate and screw fixations from T10-T11 and L1-L2. The pedicular screw at L2 on the right is broken. This may have progressed since the previous study. Degenerative changes and mild scoliosis of the lumbar spine with convexity towards the left. IMPRESSION: 1. Asymmetric atrophy of the left kidney. 2. No acute abnormalities demonstrated in the abdomen or pelvis. No bowel obstruction or inflammation. No abscess identified. 3. Postoperative changes in the lumbar spine. The right L2 pedicular screw is broken, possibly progressed since prior study. Electronically Signed   By: Lucienne Capers M.D.   On: 06/23/2021 00:25   IR Fluoro Guide Ndl Plmt / BX  Result Date: 06/23/2021 INDICATION: 50 year old female with past medical history significant for hypertension, nephrolithiasis and morbid obesity with history of recent L1-2 posterior spinal fusion. She presented to ED on 06/22/2021 complaining of back pain, right lower extremity radiculopathy, nausea/vomiting and fever. MRI of the lumbar spine reveal a right paraspinal abscess at the L4-5 level, adjacent to the corresponding facet joint. She comes to our service today for a fluoroscopy guided fine-needle aspiration. EXAM: FLUOROSCOPY GUIDED RIGHT PARASPINAL ABSCESS FINE-NEEDLE ASPIRATION MEDICATIONS:  The patient is currently admitted to the hospital and receiving intravenous antibiotics. The antibiotics were administered within an appropriate time frame prior to the initiation of the procedure. ANESTHESIA/SEDATION: The procedure was performed under local anesthesia COMPLICATIONS: None immediate. FLUOROSCOPY TIME:  36 seconds (DAP 5 mGy) PROCEDURE: Informed verbal consent was obtained from the patient, following COVID 19 guidelines, after a thorough discussion of the procedural risks, benefits and alternatives. All questions were addressed. Maximal Sterile  Barrier Technique was utilized including caps, mask, sterile gowns, sterile gloves, sterile drape, hand hygiene and skin antiseptic. A timeout was performed prior to the initiation of the procedure. The patient was made to lie prone on the angiography table. The skin in the lower back was prepped and draped in a sterile fashion. The L4-5 level was marked under fluoroscopy. The skin was infiltrated with lidocaine 1% approximally 2 cm to the right of midline at the L4-5 level. Subsequently, a 20 gauge needle was advanced through the soft tissues for approximately 5 cm. Then, approximately 4 cc of blood-tinged purulent fluid was aspirated. The fluid was sent in capped syringes to the laboratory for Gram stain and culture. The needle was subsequently withdrawn. The skin was cleaned and dressed with a sterile bandage. IMPRESSION: Successful and uncomplicated fluoroscopy guided fine-needle aspiration of a right posterior paraspinal abscess at the L4-5 level. Electronically Signed   By: Pedro Earls M.D.   On: 06/23/2021 15:25   CT L-SPINE NO CHARGE  Result Date: 06/23/2021 CLINICAL DATA:  Initial evaluation for low back pain with right lower extremity numbness and tingling. Fevers. EXAM: CT LUMBAR SPINE WITHOUT CONTRAST TECHNIQUE: Multidetector CT imaging of the lumbar spine was performed without intravenous contrast administration. Multiplanar CT image reconstructions were also generated. COMPARISON:  MRI from 10/01/2019. FINDINGS: Segmentation: Standard. Lowest well-formed disc space labeled the L5-S1 level. Alignment: Sigmoid scoliotic curvature of the lumbar spine. Trace retrolisthesis of T12 on L1 and L1 on L2, with trace anterolisthesis of L2 on L3. 4 mm anterolisthesis of L3 on L4. Vertebrae: Vertebral body height maintained without acute or chronic fracture. Visualized sacrum and pelvis intact. No findings to suggest osteomyelitis discitis or septic arthritis. SI joints symmetric and within  normal limits. Patient is status post posterior fusion at L1-2 with bilateral transpedicular screws in place. No periprosthetic lucency to suggest loosening or infection. The right-sided transpedicular screw at L2 is fractured (series 13, image 37). Hardware otherwise intact. Sequelae of prior screw removal noted at L3 and L4. Paraspinal and other soft tissues: Chronic postoperative changes present within the posterior paraspinous soft tissues. No visible acute paraspinous inflammatory changes. No loculated collections. Partially visualized abdominal viscera better evaluated on concomitant CT of the abdomen and pelvis. Disc levels: L1-2: Trace retrolisthesis with degenerative intervertebral disc space narrowing and diffuse disc bulge. Prior posterior decompression and fusion. No residual spinal stenosis. No more than mild bilateral L1 foraminal narrowing. L2-3: Degenerative intervertebral disc space narrowing with diffuse disc bulge, slightly eccentric to the right. Prior posterior decompression. Moderate bilateral facet arthrosis. No significant residual spinal stenosis. Moderate right worse than left L2 foraminal narrowing. L3-4: 4 mm anterolisthesis. Degenerative intervertebral disc space narrowing with right eccentric disc bulge. Superimposed right foraminal to extraforaminal disc protrusion contacts the exiting right L3 nerve root (series 10, image 59). Moderate bilateral facet arthrosis. Prior posterior decompression. No definite residual spinal stenosis. Mild left with moderate right L3 foraminal narrowing. L4-5: Degenerative intervertebral disc space narrowing with diffuse disc bulge and mild disc desiccation. Disc  bulging appears to be asymmetric to the right. Mild reactive endplate spurring. Moderate facet and ligament flavum hypertrophy. Prior right hemi laminectomy. Persistent moderate to severe canal with bilateral lateral recess stenosis. Moderate left worse than right L4 foraminal stenosis. L5-S1:  Shallow broad-based central disc protrusion with mild annular calcification (series 10, image 90). Bulging disc closely approximates both of the descending S1 nerve roots without frank impingement. Moderate bilateral facet arthrosis. No significant spinal stenosis. Mild-to-moderate bilateral L5 foraminal narrowing. IMPRESSION: 1. No CT evidence for acute abnormality within the lumbar spine. 2. Postoperative changes from prior posterior decompression and fusion at L1-2 without residual spinal stenosis. The right transpedicular screw at L2 is fractured. No periprosthetic lucency to suggest loosening or infection. 3. Right foraminal to extraforaminal disc protrusion at L3-4, contacting and potentially affecting the exiting right L3 nerve root. 4. Multifactorial degenerative changes at L4-5 with resultant moderate to severe canal with bilateral lateral recess stenosis, with moderate left worse than right L4 foraminal stenosis. 5. Additional mild-to-moderate foraminal narrowing elsewhere within the lumbar spine at L2, L3, and L5 as above. Electronically Signed   By: Jeannine Boga M.D.   On: 06/23/2021 01:02   Portable chest 1 View  Result Date: 06/23/2021 CLINICAL DATA:  Shortness of breath EXAM: PORTABLE CHEST 1 VIEW COMPARISON:  Chest radiograph 04/22/2020 FINDINGS: The cardiomediastinal silhouette is normal. Lung volumes are mildly low. There is no focal consolidation or pulmonary edema. There is no pleural effusion or pneumothorax. There is no acute osseous abnormality. Spinal fusion hardware is noted. IMPRESSION: Low lung volumes. Otherwise, no radiographic evidence of acute cardiopulmonary process. Electronically Signed   By: Valetta Mole M.D.   On: 06/23/2021 10:08    Assessment/Plan: 50 y.o. female with acute inflammatory changes in the right paraspinous tissue of the lumbar spine with L2-L5 septic arthritis. She continues to be at her neurological baseline. Pain continues to be severe and  unchanged. Had fluro guided needle aspiration of right paraspinal abscess yesterday with IR. Continue abx per primary/ID. No new neurosurgical recommendations at this time. Call with any questions.    LOS: 1 day     Marvis Moeller, DNP, NP-C 06/24/2021, 9:19 AM

## 2021-06-24 NOTE — Consult Note (Signed)
[facility]    Date of Admission:  06/22/2021     Total days of antibiotics 3               Reason for Consult: Enterococcus Bacteremia   Referring Provider: [employer] Primary Care Provider: [name], NP   ASSESSMENT:  Mary Chambers is a 50 y/o caucasian female admitted with worsening back pain refractory to Cefazolin previously prescribed for suspected UTI and found to have L4-L5 septic arthritis and fluid collection now s/p aspiration. Abscess cultures and blood cultures are now positive for Enterococcus faecalis. Situation is complicated with hardware from multiple previous surgeries. Will check TTE for endocarditis and will likely need TEE for confirmation. Recheck blood culture. Continue current dose of ampicillin. Monitor cultures for sensitivities. Discussed that she will need a prolonged course of antibiotics which is complicated by the presence of hardware. Remaining supportive care per primary team. ID will continue to follow.   PLAN:  Continue ampicillin.  TTE to check for endocarditis and likely to need TEE.  Repeat blood cultures. Hold PICC line/central line placement until cultures are clear. Continue supportive care per primary team.    Principal Problem:   Sepsis ([place]) Active Problems:   Hypertension, benign   Morbidly obese ([facility])   Septic arthritis ([facility])   Transaminitis   Abscess   COVID-19 virus infection   Suspected UTI    vitamin C  500 mg Oral Daily   atorvastatin  10 mg Oral Daily   enalapril  10 mg Oral Daily   enoxaparin (LOVENOX) injection  40 mg Subcutaneous Q24H   pantoprazole  40 mg Oral Daily   sodium chloride flush  3 mL Intravenous Q12H   zinc sulfate  220 mg Oral Daily     HPI: Mary Chambers is a 50 y.o. female with previous medical history of depression, hypertension and obesity admitted with right lower back pain.  Mary Chambers was initially seen in the ED on 06/19/21 with  3 day history of gradually worsening right-sided flank and right lower quadrant abdominal pain that was worse with movement and palpation. Had difficulty with voiding. No other urinary symptoms. CT abdomen/pelvis with no acute findings; and posterior-lateral fusion at L1-L2 with interval right L2 pedicle screw fracture. Work up believed to be consistent with UTI/pyelonephritis. Given course of cephalexin. Pain worsened despite taking antibiotics as prescribed and now with radiculopathy down the right leg. Repeat CT abd/pelvis with no acute abnormalities with screw now appearing broken. CT lumbar spine with no evidence of acute abnormality; postoperative changes from prior decompression with L2 transpedicular screw fractured; and right foraminal disc protrusion at L4-L5. Transferred to [facility] for MRI with MRI lumbar with changes at L3-L4 and L4-L5 concerning for septic arthritis; and superimposed rim enhancing collection extending posteriorly from the right L4-L5 facet measuring 2.4 x 1.8 x 1.6 cm. Neurosurgery consulted and recommend IR evaluation and aspiration.   IR performed fluoroscopy guided fine-needle aspiration and obtained 4 cc of purulent fluid that was sent for culture. Abscess specimen with no organisms on gram staing and Enterococcus faecalis on culture. Blood cultures from 06/22/21 are also positive for Enterococcus faecalis. Initially started on vancomycin and ceftriaxone and and now narrowed to ampicillin. Feeling better since admission but continues to have back pain.   Review of Systems: Review of Systems  Constitutional:  Negative for chills, fever and weight loss.  Respiratory:  Negative for cough, shortness of breath and wheezing.  Cardiovascular:  Negative for chest pain and leg swelling.  Gastrointestinal:  Negative for abdominal pain, constipation, diarrhea, nausea and vomiting.  Musculoskeletal:  Positive for back pain.  Skin:  Negative for rash.    Past Medical History:   Diagnosis Date   Arthritis    Depression    Hypertension    Kidney stone    Obesity     Social History   Tobacco Use   Smoking status: Never   Smokeless tobacco: Never  Vaping Use   Vaping Use: Never used  Substance Use Topics   Alcohol use: No   Drug use: Never    Family History  Problem Relation Age of Onset   Diabetes Mother    Hypertension Mother    Cancer Father    Parkinson's disease Father    Heart disease Father    Heart disease Sister    Heart disease Brother    Colon cancer Neg Hx    Rectal cancer Neg Hx    Stomach cancer Neg Hx     Allergies  Allergen Reactions   Baclofen Hives   Doxycycline Itching   Percocet [Oxycodone-Acetaminophen] Hives   Septra [Sulfamethoxazole-Trimethoprim] Hives   Gabapentin Rash    OBJECTIVE: Blood pressure 120/71, pulse 81, temperature 98.4 F (36.9 C), temperature source Oral, resp. rate 18, height 5\' 7"  (1.702 m), weight 120.7 kg, SpO2 99 %.  Physical Exam Constitutional:      General: She is not in acute distress.    Appearance: She is well-developed. She is obese.     Comments: Lying in bed with head of bed elevated; pleasant. Tearful at times.   Cardiovascular:     Rate and Rhythm: Normal rate and regular rhythm.     Heart sounds: Normal heart sounds.  Pulmonary:     Effort: Pulmonary effort is normal.     Breath sounds: Normal breath sounds.  Musculoskeletal:     Comments: There is tenderness along lower thoracic and upper lumbar spine and paraspinal musculature.   Skin:    General: Skin is warm and dry.  Neurological:     Mental Status: She is alert and oriented to person, place, and time.  Psychiatric:        Behavior: Behavior normal.    Lab Results Lab Results  Component Value Date   WBC 11.2 (H) 06/22/2021   HGB 12.3 06/22/2021   HCT 38.6 06/22/2021   MCV 83.7 06/22/2021   PLT 243 06/22/2021    Lab Results  Component Value Date   CREATININE 0.89 06/22/2021   BUN 16 06/22/2021   NA 139  06/22/2021   K 3.7 06/22/2021   CL 100 06/22/2021   CO2 26 06/22/2021    Lab Results  Component Value Date   ALT 50 (H) 06/23/2021   AST 21 06/23/2021   ALKPHOS 128 (H) 06/23/2021   BILITOT 0.7 06/23/2021     Microbiology: Recent Results (from the past 240 hour(s))  Resp Panel by RT-PCR (Flu A&B, Covid) Nasopharyngeal Swab     Status: Abnormal   Collection Time: 06/22/21 10:57 PM   Specimen: Nasopharyngeal Swab; Nasopharyngeal(NP) swabs in vial transport medium  Result Value Ref Range Status   SARS Coronavirus 2 by RT PCR POSITIVE (A) NEGATIVE Final    Comment: RESULT CALLED TO, READ BACK BY AND VERIFIED WITH: Burgess Amor RN AT 0006 06/23/21 [place] (NOTE) SARS-CoV-2 target nucleic acids are DETECTED.  The SARS-CoV-2 RNA is generally detectable in upper respiratory specimens during the acute  phase of infection. Positive results are indicative of the presence of the identified virus, but do not rule out bacterial infection or co-infection with other pathogens not detected by the test. Clinical correlation with patient history and other diagnostic information is necessary to determine patient infection status. The expected result is Negative.  Fact Sheet for Patients: EntrepreneurPulse.com.au  Fact Sheet for Healthcare Providers: IncredibleEmployment.be  This test is not yet approved or cleared by the [place] FDA and  has been authorized for detection and/or diagnosis of SARS-CoV-2 by FDA under an Emergency Use Authorization (EUA).  This EUA will remain in effect (meaning this test can be  used) for the duration of  the COVID-19 declaration under Section 564(b)(1) of the Act, 21 U.S.C. section 360bbb-3(b)(1), unless the authorization is terminated or revoked sooner.     Influenza A by PCR NEGATIVE NEGATIVE Final   Influenza B by PCR NEGATIVE NEGATIVE Final    Comment: (NOTE) The Xpert Xpress SARS-CoV-2/FLU/RSV plus assay is intended  as an aid in the diagnosis of influenza from Nasopharyngeal swab specimens and should not be used as a sole basis for treatment. Nasal washings and aspirates are unacceptable for Xpert Xpress SARS-CoV-2/FLU/RSV testing.  Fact Sheet for Patients: EntrepreneurPulse.com.au  Fact Sheet for Healthcare Providers: IncredibleEmployment.be  This test is not yet approved or cleared by the [place] FDA and has been authorized for detection and/or diagnosis of SARS-CoV-2 by FDA under an Emergency Use Authorization (EUA). This EUA will remain in effect (meaning this test can be used) for the duration of the COVID-19 declaration under Section 564(b)(1) of the Act, 21 U.S.C. section 360bbb-3(b)(1), unless the authorization is terminated or revoked.  Performed at [facility], [address] [place], [place] [address]   Blood culture (routine x 2)     Status: Abnormal (Preliminary result)   Collection Time: 06/22/21 11:22 PM   Specimen: BLOOD  Result Value Ref Range Status   Specimen Description   Final    BLOOD BOTTLES DRAWN AEROBIC AND ANAEROBIC Performed at [facility], [address] [place], [place] [address]    Special Requests   Final    Blood Culture adequate volume RIGHT ANTECUBITAL Performed at [facility], [place], [place] [address]    Culture  Setup Time   Final    GRAM POSITIVE COCCI IN BOTH AEROBIC AND ANAEROBIC BOTTLES CRITICAL RESULT CALLED TO, READ BACK BY AND VERIFIED WITH: PHARMD [place] PHAM 06/23/21@23 :25 BY TW    Culture (A)  Final    ENTEROCOCCUS FAECALIS CULTURE REINCUBATED FOR BETTER GROWTH Performed at [facility], [place] [address], [place],  [address]    Report Status PENDING  Incomplete  Blood Culture ID Panel (Reflexed)     Status: Abnormal   Collection Time: 06/22/21 11:22 PM  Result Value Ref Range Status   Enterococcus  faecalis DETECTED (A) NOT DETECTED Final    Comment: CRITICAL RESULT CALLED TO, READ BACK BY AND VERIFIED WITH: PHARMD [place] PHAM 06/23/21@23 :25 BY TW    Enterococcus Faecium NOT DETECTED NOT DETECTED Final   Listeria monocytogenes NOT DETECTED NOT DETECTED Final   Staphylococcus species NOT DETECTED NOT DETECTED Final   Staphylococcus aureus (BCID) NOT DETECTED NOT DETECTED Final   Staphylococcus epidermidis NOT DETECTED NOT DETECTED Final   Staphylococcus lugdunensis NOT DETECTED NOT DETECTED Final   Streptococcus species NOT DETECTED NOT DETECTED Final   Streptococcus agalactiae NOT DETECTED NOT DETECTED Final   Streptococcus pneumoniae NOT DETECTED  NOT DETECTED Final   Streptococcus pyogenes NOT DETECTED NOT DETECTED Final   A.calcoaceticus-baumannii NOT DETECTED NOT DETECTED Final   Bacteroides fragilis NOT DETECTED NOT DETECTED Final   Enterobacterales NOT DETECTED NOT DETECTED Final   Enterobacter cloacae complex NOT DETECTED NOT DETECTED Final   Escherichia coli NOT DETECTED NOT DETECTED Final   Klebsiella aerogenes NOT DETECTED NOT DETECTED Final   Klebsiella oxytoca NOT DETECTED NOT DETECTED Final   Klebsiella pneumoniae NOT DETECTED NOT DETECTED Final   Proteus species NOT DETECTED NOT DETECTED Final   Salmonella species NOT DETECTED NOT DETECTED Final   Serratia marcescens NOT DETECTED NOT DETECTED Final   Haemophilus influenzae NOT DETECTED NOT DETECTED Final   Neisseria meningitidis NOT DETECTED NOT DETECTED Final   Pseudomonas aeruginosa NOT DETECTED NOT DETECTED Final   Stenotrophomonas maltophilia NOT DETECTED NOT DETECTED Final   Candida albicans NOT DETECTED NOT DETECTED Final   Candida auris NOT DETECTED NOT DETECTED Final   Candida glabrata NOT DETECTED NOT DETECTED Final   Candida krusei NOT DETECTED NOT DETECTED Final   Candida parapsilosis NOT DETECTED NOT DETECTED Final   Candida tropicalis NOT DETECTED NOT DETECTED Final   Cryptococcus neoformans/gattii NOT  DETECTED NOT DETECTED Final   Vancomycin resistance NOT DETECTED NOT DETECTED Final    Comment: Performed at [facility], [address] [place], [place] [address]  Blood culture (routine x 2)     Status: Abnormal (Preliminary result)   Collection Time: 06/22/21 11:35 PM   Specimen: BLOOD LEFT WRIST  Result Value Ref Range Status   Specimen Description BLOOD LEFT WRIST  Final   Special Requests   Final    BOTTLES DRAWN AEROBIC AND ANAEROBIC Blood Culture adequate volume   Culture  Setup Time   Final    GRAM POSITIVE COCCI IN BOTH AEROBIC AND ANAEROBIC BOTTLES CRITICAL VALUE NOTED.  VALUE IS CONSISTENT WITH PREVIOUSLY REPORTED AND CALLED VALUE. Performed at [facility], [place] [address] [place], [place] [address]    Culture ENTEROCOCCUS FAECALIS (A)  Final   Report Status PENDING  Incomplete  Aerobic/Anaerobic Culture w Gram Stain (surgical/deep wound)     Status: None (Preliminary result)   Collection Time: 06/23/21  2:52 PM   Specimen: Back; Tissue  Result Value Ref Range Status   Specimen Description ABSCESS  Final   Special Requests PARASPINAL L4 L5  Final   Gram Stain   Final    MODERATE WBC PRESENT,BOTH PMN AND MONONUCLEAR NO ORGANISMS SEEN    Culture   Final    RARE ENTEROCOCCUS FAECALIS SUSCEPTIBILITIES TO FOLLOW Performed at [facility], [address] [place], [place] [address]    Report Status PENDING  Incomplete     Terri Piedra, [facility]  06/24/2021  1:45 PM

## 2021-06-25 ENCOUNTER — Other Ambulatory Visit (HOSPITAL_COMMUNITY): Payer: 59

## 2021-06-25 DIAGNOSIS — A419 Sepsis, unspecified organism: Secondary | ICD-10-CM | POA: Diagnosis not present

## 2021-06-25 DIAGNOSIS — R6520 Severe sepsis without septic shock: Secondary | ICD-10-CM | POA: Diagnosis not present

## 2021-06-25 LAB — PHOSPHORUS: Phosphorus: 3.2 mg/dL (ref 2.5–4.6)

## 2021-06-25 LAB — MAGNESIUM: Magnesium: 1.8 mg/dL (ref 1.7–2.4)

## 2021-06-25 MED ORDER — SODIUM CHLORIDE 0.9 % IV SOLN
2.0000 g | Freq: Two times a day (BID) | INTRAVENOUS | Status: DC
  Administered 2021-06-25 – 2021-07-01 (×13): 2 g via INTRAVENOUS
  Filled 2021-06-25 (×14): qty 20

## 2021-06-25 NOTE — Progress Notes (Addendum)
Id brief note  Sepsis resolved Repeat bcx in progress Started on paxlovid for mild/assynmptomatic covid infection per primary team   A/p E faecalis bacteremia/vertebral om -- need to r/o endocarditis as tx is different Mild covid infection -- on paxlovid; sx onset (nausea?) 9/19   -called cardiology to arrange tee for next week; anticipate 10 day covid isolation from sx onset 9/19-9/29  -I will add ceftriaxone 2 gram bid today pending tee (if tee negative can stop ceftriaxone) -covid care per primary team otherwise  ------------ Addendum Unable to contact cardmaster about tee. Will still need to arrange such this coming week

## 2021-06-25 NOTE — Progress Notes (Signed)
PROGRESS NOTE    Mary Chambers  CVE:[id number] DOB: [birth year] DOA: 06/22/2021 PCP: [name], NP   Chief Complaint  Patient presents with   Back Pain   Fever   Emesis    Brief Narrative:  Mary Chambers is Mary Chambers 50 y.o. female with medical history significant of hypertension, nephrolithiasis, morbid obesity, and prior back surgery back in 2020 who presents with complaints of back pain.  She was seen in urgent care and had CT abdomen pelvis without acute abnormalities and was discharged home on keflex.  Subsequently developed worsening symptoms, radiating pain down her R leg.  Fevers.  MRI was concerning for septic arthritis of the spine with possible abscess.    She's been admitted for treatment of her septic arthritis of lumbar spine complicated by abscess and  enterococcus bacteremia.  S/p IR drainage.  ID and neurosurgery following.    Assessment & Plan:   Principal Problem:   Sepsis ([place]) Active Problems:   Hypertension, benign   Morbidly obese ([facility])   Septic arthritis ([facility])   Transaminitis   Abscess   COVID-19 virus infection   Suspected UTI   Vertebral osteomyelitis ([place])  Enterococcus Faecalis Bacteremia  Septic Arthritis of Lumbar Spine  Paraspinal Abscess  Sepsis:  Patient presents with complaints of back pain with radiation down her right leg and fever noted up to 101.2 F with heart rates elevated up to 119 meeting SIRS criteria.  Labs significant for ESR 98 and WBC 11.2.  - MRI T/L spine notable for findings concerning for septic arthritis and abscess, diffuse epidural enhancement about the lower thecal sac at the levels of L4-5 and L5-S1 without frank epidural abscess - blood cx with enterococcus faecalis - s/p IR aspiration of right posterior paraspinal abscess - pending culture with rare enterococcus faecalis  - ID c/s, appreciate recs  - repeat blood cx pending  - echo pending - may need TEE  - ampicillin, ceftriaxone - NSGY  c/s, appreciate recs   COVID-19 infection: Acute.  Patient reports that she has had Mary Chambers nonproductive cough for few days now.  Initially thought symptoms were secondary to sinuses.  Noted to be incidentally noted to be positive for COVID-19. -symptoms mild, discussed use of antiviral like paxlovid, she's interested and agreeable (discussed with pharmacy) -hold lipitor while treating -needs 10 day isolation   Suspect urinary tract infection: Patient reported having urinary urgency with intermittent incontinence and not able to make it to the bathroom in time.  Urinalysis was significant for large leukocytes, rare bacteria, and 21-50 WBCs. -Follow-up urine culture (none sent, will follow up) -Patient on empiric antibiotics as seen above   Transaminitis: Acute.  Labs from 9/18 noted alkaline phosphatase 215, AST 88, and ALT 224.   -Check LFTs and hepatitis panel (negative) - improving, follow - labs pending   Essential hypertension: Blood pressures are initially elevated at 161/90.  Home blood pressure medications include enalapril 10 mg daily, furosemide 20 mg daily, and hydrochlorothiazide 25 mg daily. -will need to stop loop or diuretic - currently on hold -Continue enalapril   Back pain with radicular: Acute on chronic.  Patient with prior history of lumbar fusion back in 2020.  Home medications included tramadol. -Pain medications as seen above   Hyperlipidemia -holding lipitor   Morbid obesity: BMI 41.66 kg per metered square.  Patient had been on phentermine in the outpatient setting. -Consider resuming phentermine in the outpatient setting  Nocturnal Hypoxia - likely needs outpatient sleep  study  DVT prophylaxis: lovenox Code Status:full Family Communication: none at bedside Disposition:   Status is: Inpatient  Remains inpatient appropriate because:Inpatient level of care appropriate due to severity of illness  Dispo: The patient is from: Home              Anticipated d/c  is to: Home              Patient currently is not medically stable to d/c.   Difficult to place patient No       Consultants:  IR NSGY  Procedures:  IMPRESSION: Successful and uncomplicated fluoroscopy guided fine-needle aspiration of Mary Chambers right posterior paraspinal abscess at the L4-5 level.  Antimicrobials:  Anti-infectives (From admission, onward)    Start     Dose/Rate Route Frequency Ordered Stop   06/25/21 1415  cefTRIAXone (ROCEPHIN) 2 g in sodium chloride 0.9 % 100 mL IVPB        2 g 200 mL/hr over 30 Minutes Intravenous Every 12 hours 06/25/21 1324     06/24/21 2200  nirmatrelvir/ritonavir EUA (PAXLOVID) 3 tablet        3 tablet Oral 2 times daily 06/24/21 1622 06/29/21 2159   06/24/21 0030  ampicillin (OMNIPEN) 2 g in sodium chloride 0.9 % 100 mL IVPB        2 g 300 mL/hr over 20 Minutes Intravenous Every 4 hours 06/23/21 2342     06/24/21 0000  cefTRIAXone (ROCEPHIN) 2 g in sodium chloride 0.9 % 100 mL IVPB  Status:  Discontinued        2 g 200 mL/hr over 30 Minutes Intravenous Every 24 hours 06/23/21 0753 06/23/21 2342   06/23/21 1930  metroNIDAZOLE (FLAGYL) IVPB 500 mg  Status:  Discontinued        500 mg 100 mL/hr over 60 Minutes Intravenous Every 12 hours 06/23/21 0753 06/23/21 2342   06/23/21 1000  vancomycin (VANCOCIN) IVPB 1000 mg/200 mL premix  Status:  Discontinued        1,000 mg 200 mL/hr over 60 Minutes Intravenous Every 12 hours 06/23/21 0954 06/23/21 2342   06/22/21 2330  vancomycin (VANCOCIN) IVPB 1000 mg/200 mL premix        1,000 mg 200 mL/hr over 60 Minutes Intravenous  Once 06/22/21 2319 06/23/21 0731   06/22/21 2330  metroNIDAZOLE (FLAGYL) IVPB 500 mg        500 mg 100 mL/hr over 60 Minutes Intravenous  Once 06/22/21 2319 06/23/21 0838   06/22/21 2330  cefTRIAXone (ROCEPHIN) 1 g in sodium chloride 0.9 % 100 mL IVPB        1 g 200 mL/hr over 30 Minutes Intravenous  Once 06/22/21 2319 06/23/21 0109          Subjective: C/o metallic  taste with paxlovid  Objective: Vitals:   06/24/21 1605 06/24/21 2016 06/25/21 0400 06/25/21 0859  BP: 112/71 114/70 116/75 107/68  Pulse: 100 97 88 89  Resp: 18 18 18 18   Temp:  98.6 F (37 C) 98.7 F (37.1 C) 98.2 F (36.8 C)  TempSrc:  Oral Oral Oral  SpO2: 99% 94% 98% 98%  Weight:      Height:        Intake/Output Summary (Last 24 hours) at 06/25/2021 1433 Last data filed at 06/25/2021 1343 Gross per 24 hour  Intake 1612.32 ml  Output 800 ml  Net 812.32 ml   Filed Weights   06/22/21 1940  Weight: 120.7 kg    Examination:  General: No acute  distress. Cardiovascular: RRR Lungs:  unlabored. Abdomen: Soft, nontender, nondistended  Neurological: Alert and oriented 3. Moves all extremities 4 . Cranial nerves II through XII grossly intact. Skin: Warm and dry. No rashes or lesions. Extremities: No clubbing or cyanosis. No edema.  Data Reviewed: I have personally reviewed following labs and imaging studies  CBC: Recent Labs  Lab 06/19/21 0125 06/22/21 2257 06/24/21 1543  WBC 10.2 11.2* 7.5  NEUTROABS 6.0 7.2 4.6  HGB 12.9 12.3 10.6*  HCT 39.0 38.6 33.5*  MCV 83.0 83.7 85.9  PLT 195 243 174    Basic Metabolic Panel: Recent Labs  Lab 06/19/21 0125 06/22/21 2257 06/24/21 1543 06/25/21 0359  NA 136 139 138  --   K 3.5 3.7 3.5  --   CL 98 100 104  --   CO2 26 26 26   --   GLUCOSE 111* 96 131*  --   BUN 16 16 7   --   CREATININE 0.88 0.89 0.77  --   CALCIUM 9.3 9.6 8.3*  --   MG  --   --   --  1.8  PHOS  --   --   --  3.2    GFR: Estimated Creatinine Clearance: 113.2 mL/min (by C-G formula based on SCr of 0.77 mg/dL).  Liver Function Tests: Recent Labs  Lab 06/19/21 0125 06/23/21 1000 06/24/21 1543  AST 88* 21 28  ALT 224* 50* 40  ALKPHOS 215* 128* 105  BILITOT 0.6 0.7 0.4  PROT 7.4 6.6 6.4*  ALBUMIN 4.0 2.5* 2.2*    CBG: No results for input(s): GLUCAP in the last 168 hours.   Recent Results (from the past 240 hour(s))  Resp Panel  by RT-PCR (Flu Destenie Ingber&B, Covid) Nasopharyngeal Swab     Status: Abnormal   Collection Time: 06/22/21 10:57 PM   Specimen: Nasopharyngeal Swab; Nasopharyngeal(NP) swabs in vial transport medium  Result Value Ref Range Status   SARS Coronavirus 2 by RT PCR POSITIVE (Mary Chambers) NEGATIVE Final    Comment: RESULT CALLED TO, READ BACK BY AND VERIFIED WITH: Burgess Amor RN AT 0006 06/23/21 [place] (NOTE) SARS-CoV-2 target nucleic acids are DETECTED.  The SARS-CoV-2 RNA is generally detectable in upper respiratory specimens during the acute phase of infection. Positive results are indicative of the presence of the identified virus, but do not rule out bacterial infection or co-infection with other pathogens not detected by the test. Clinical correlation with patient history and other diagnostic information is necessary to determine patient infection status. The expected result is Negative.  Fact Sheet for Patients: EntrepreneurPulse.com.au  Fact Sheet for Healthcare Providers: IncredibleEmployment.be  This test is not yet approved or cleared by the [place] FDA and  has been authorized for detection and/or diagnosis of SARS-CoV-2 by FDA under an Emergency Use Authorization (EUA).  This EUA will remain in effect (meaning this test can be  used) for the duration of  the COVID-19 declaration under Section 564(b)(1) of the Act, 21 U.S.C. section 360bbb-3(b)(1), unless the authorization is terminated or revoked sooner.     Influenza Garron Eline by PCR NEGATIVE NEGATIVE Final   Influenza B by PCR NEGATIVE NEGATIVE Final    Comment: (NOTE) The Xpert Xpress SARS-CoV-2/FLU/RSV plus assay is intended as an aid in the diagnosis of influenza from Nasopharyngeal swab specimens and should not be used as Mary Chambers sole basis for treatment. Nasal washings and aspirates are unacceptable for Xpert Xpress SARS-CoV-2/FLU/RSV testing.  Fact Sheet for  Patients: EntrepreneurPulse.com.au  Fact Sheet for Healthcare Providers:  IncredibleEmployment.be  This test is not yet approved or cleared by the [place] and has been authorized for detection and/or diagnosis of SARS-CoV-2 by FDA under an Emergency Use Authorization (EUA). This EUA will remain in effect (meaning this test can be used) for the duration of the COVID-19 declaration under Section 564(b)(1) of the Act, 21 U.S.C. section 360bbb-3(b)(1), unless the authorization is terminated or revoked.  Performed at [facility], [address] [place], [place] [address]   Blood culture (routine x 2)     Status: Abnormal (Preliminary result)   Collection Time: 06/22/21 11:22 PM   Specimen: BLOOD  Result Value Ref Range Status   Specimen Description   Final    BLOOD BOTTLES DRAWN AEROBIC AND ANAEROBIC Performed at [facility], [address], [place], [place] [address]    Special Requests   Final    Blood Culture adequate volume RIGHT ANTECUBITAL Performed at [facility], [place], [place] [address]    Culture  Setup Time   Final    GRAM POSITIVE COCCI IN BOTH AEROBIC AND ANAEROBIC BOTTLES CRITICAL RESULT CALLED TO, READ BACK BY AND VERIFIED WITH: PHARMD [place] PHAM 06/23/21@23 :25 BY TW    Culture (Mary Chambers)  Final    ENTEROCOCCUS FAECALIS SUSCEPTIBILITIES TO FOLLOW Performed at [facility], [place] [address], [place], [place] [address]    Report Status PENDING  Incomplete  Blood Culture ID Panel (Reflexed)     Status: Abnormal   Collection Time: 06/22/21 11:22 PM  Result Value Ref Range Status   Enterococcus faecalis DETECTED (Mary Chambers) NOT DETECTED Final    Comment: CRITICAL RESULT CALLED TO, READ BACK BY AND VERIFIED WITH: PHARMD [place] 06/23/21@23 :25 BY TW    Enterococcus Faecium NOT DETECTED NOT DETECTED Final   Listeria monocytogenes NOT DETECTED NOT  DETECTED Final   Staphylococcus species NOT DETECTED NOT DETECTED Final   Staphylococcus aureus (BCID) NOT DETECTED NOT DETECTED Final   Staphylococcus epidermidis NOT DETECTED NOT DETECTED Final   Staphylococcus lugdunensis NOT DETECTED NOT DETECTED Final   Streptococcus species NOT DETECTED NOT DETECTED Final   Streptococcus agalactiae NOT DETECTED NOT DETECTED Final   Streptococcus pneumoniae NOT DETECTED NOT DETECTED Final   Streptococcus pyogenes NOT DETECTED NOT DETECTED Final   Mary Chambers NOT DETECTED NOT DETECTED Final   Bacteroides fragilis NOT DETECTED NOT DETECTED Final   Enterobacterales NOT DETECTED NOT DETECTED Final   Enterobacter cloacae complex NOT DETECTED NOT DETECTED Final   Escherichia coli NOT DETECTED NOT DETECTED Final   Klebsiella aerogenes NOT DETECTED NOT DETECTED Final   Klebsiella oxytoca NOT DETECTED NOT DETECTED Final   Klebsiella pneumoniae NOT DETECTED NOT DETECTED Final   Proteus species NOT DETECTED NOT DETECTED Final   Salmonella species NOT DETECTED NOT DETECTED Final   Serratia marcescens NOT DETECTED NOT DETECTED Final   Haemophilus influenzae NOT DETECTED NOT DETECTED Final   Neisseria meningitidis NOT DETECTED NOT DETECTED Final   Pseudomonas aeruginosa NOT DETECTED NOT DETECTED Final   Stenotrophomonas maltophilia NOT DETECTED NOT DETECTED Final   Candida albicans NOT DETECTED NOT DETECTED Final   Candida auris NOT DETECTED NOT DETECTED Final   Candida glabrata NOT DETECTED NOT DETECTED Final   Candida krusei NOT DETECTED NOT DETECTED Final   Candida parapsilosis NOT DETECTED NOT DETECTED Final   Candida tropicalis NOT DETECTED NOT DETECTED Final   Cryptococcus neoformans/gattii NOT DETECTED NOT DETECTED Final   Vancomycin resistance NOT DETECTED NOT DETECTED Final  Comment: Performed at [facility], [place] [address], [place], [place] [address]  Blood culture (routine x 2)     Status: Abnormal (Preliminary result)    Collection Time: 06/22/21 11:35 PM   Specimen: BLOOD LEFT WRIST  Result Value Ref Range Status   Specimen Description BLOOD LEFT WRIST  Final   Special Requests   Final    BOTTLES DRAWN AEROBIC AND ANAEROBIC Blood Culture adequate volume   Culture  Setup Time   Final    GRAM POSITIVE COCCI IN BOTH AEROBIC AND ANAEROBIC BOTTLES CRITICAL VALUE NOTED.  VALUE IS CONSISTENT WITH PREVIOUSLY REPORTED AND CALLED VALUE. Performed at [facility], [place] [address], [place], [place] [address]    Culture ENTEROCOCCUS FAECALIS (Mary Chambers)  Final   Report Status PENDING  Incomplete  Aerobic/Anaerobic Culture w Gram Stain (surgical/deep wound)     Status: None (Preliminary result)   Collection Time: 06/23/21  2:52 PM   Specimen: Back; Tissue  Result Value Ref Range Status   Specimen Description ABSCESS  Final   Special Requests PARASPINAL L4 L5  Final   Gram Stain   Final    MODERATE WBC PRESENT,BOTH PMN AND MONONUCLEAR NO ORGANISMS SEEN Performed at [facility], [address] [place], [place] [address]    Culture RARE ENTEROCOCCUS FAECALIS  Final   Report Status PENDING  Incomplete   Organism ID, Bacteria ENTEROCOCCUS FAECALIS  Final      Susceptibility   Enterococcus faecalis - MIC*    AMPICILLIN <=2 SENSITIVE Sensitive     VANCOMYCIN 1 SENSITIVE Sensitive     GENTAMICIN SYNERGY SENSITIVE Sensitive     * RARE ENTEROCOCCUS FAECALIS  Culture, blood (routine x 2)     Status: None (Preliminary result)   Collection Time: 06/24/21  3:44 PM   Specimen: BLOOD  Result Value Ref Range Status   Specimen Description BLOOD LEFT ANTECUBITAL  Final   Special Requests   Final    BOTTLES DRAWN AEROBIC AND ANAEROBIC Blood Culture adequate volume   Culture   Final    NO GROWTH < 12 HOURS Performed at [facility], [address], [place], [place] [address]    Report Status PENDING  Incomplete  Culture, blood (routine x 2)     Status: None (Preliminary result)   Collection Time: 06/24/21   4:00 PM   Specimen: BLOOD LEFT HAND  Result Value Ref Range Status   Specimen Description BLOOD LEFT HAND  Final   Special Requests   Final    BOTTLES DRAWN AEROBIC ONLY Blood Culture adequate volume   Culture   Final    NO GROWTH < 12 HOURS Performed at [facility], [place] [address], [place], [place] [address]    Report Status PENDING  Incomplete         Radiology Studies: IR Fluoro Guide Ndl Plmt / BX  Result Date: 06/23/2021 INDICATION: 50 year old female with past medical history significant for hypertension, nephrolithiasis and morbid obesity with history of recent L1-2 posterior spinal fusion. She presented to ED on 06/22/2021 complaining of back pain, right lower extremity radiculopathy, nausea/vomiting and fever. MRI of the lumbar spine reveal Mary Chambers right paraspinal abscess at the L4-5 level, adjacent to the corresponding facet joint. She comes to our service today for Mary Chambers fluoroscopy guided fine-needle aspiration. EXAM: FLUOROSCOPY GUIDED RIGHT PARASPINAL ABSCESS FINE-NEEDLE ASPIRATION MEDICATIONS: The patient is currently admitted to the hospital and receiving intravenous antibiotics. The antibiotics were administered within  an appropriate time frame prior to the initiation of the procedure. ANESTHESIA/SEDATION: The procedure was performed under local anesthesia COMPLICATIONS: None immediate. FLUOROSCOPY TIME:  36 seconds (DAP 5 mGy) PROCEDURE: Informed verbal consent was obtained from the patient, following COVID 19 guidelines, after Mary Chambers thorough discussion of the procedural risks, benefits and alternatives. All questions were addressed. Mary Chambers Technique was utilized including caps, mask, sterile gowns, sterile gloves, sterile drape, hand hygiene and skin antiseptic. Mary Chambers timeout was performed prior to the initiation of the procedure. The patient was made to lie prone on the angiography table. The skin in the lower back was prepped and draped in Dalphine Cowie sterile fashion. The L4-5  level was marked under fluoroscopy. The skin was infiltrated with lidocaine 1% approximally 2 cm to the right of midline at the L4-5 level. Subsequently, Jesyka Slaght 20 gauge needle was advanced through the soft tissues for approximately 5 cm. Then, approximately 4 cc of blood-tinged purulent fluid was aspirated. The fluid was sent in capped syringes to the laboratory for Gram stain and culture. The needle was subsequently withdrawn. The skin was cleaned and dressed with Jedd Schulenburg sterile bandage. IMPRESSION: Successful and uncomplicated fluoroscopy guided fine-needle aspiration of Shawntel Farnworth right posterior paraspinal abscess at the L4-5 level. Electronically Signed   By: Pedro Earls M.D.   On: 06/23/2021 15:25        Scheduled Meds:  vitamin C  500 mg Oral Daily   [START ON 07/01/2021] atorvastatin  10 mg Oral Daily   enalapril  10 mg Oral Daily   enoxaparin (LOVENOX) injection  40 mg Subcutaneous Q24H   nirmatrelvir/ritonavir EUA  3 tablet Oral BID   pantoprazole  40 mg Oral Daily   sodium chloride flush  3 mL Intravenous Q12H   zinc sulfate  220 mg Oral Daily   Continuous Infusions:  sodium chloride 10 mL/hr at 06/25/21 0700   ampicillin (OMNIPEN) IV 2 g (06/25/21 1126)   cefTRIAXone (ROCEPHIN)  IV       LOS: 2 days    Time spent: over 30 min    Fayrene Helper, MD [facility]   To contact the attending provider between 7A-7P or the covering provider during after hours 7P-7A, please log into the web site [email or web address] and access using universal [place] password for that web site. If you do not have the password, please call the hospital operator.  06/25/2021, 2:33 PM

## 2021-06-26 ENCOUNTER — Inpatient Hospital Stay (HOSPITAL_COMMUNITY): Payer: 59

## 2021-06-26 DIAGNOSIS — R7881 Bacteremia: Secondary | ICD-10-CM | POA: Diagnosis not present

## 2021-06-26 DIAGNOSIS — A419 Sepsis, unspecified organism: Secondary | ICD-10-CM | POA: Diagnosis not present

## 2021-06-26 DIAGNOSIS — R6520 Severe sepsis without septic shock: Secondary | ICD-10-CM | POA: Diagnosis not present

## 2021-06-26 LAB — CBC WITH DIFFERENTIAL/PLATELET
Abs Immature Granulocytes: 0.02 10*3/uL (ref 0.00–0.07)
Basophils Absolute: 0 10*3/uL (ref 0.0–0.1)
Basophils Relative: 1 %
Eosinophils Absolute: 0.2 10*3/uL (ref 0.0–0.5)
Eosinophils Relative: 3 %
HCT: 32.4 % — ABNORMAL LOW (ref 36.0–46.0)
Hemoglobin: 10.2 g/dL — ABNORMAL LOW (ref 12.0–15.0)
Immature Granulocytes: 0 %
Lymphocytes Relative: 35 %
Lymphs Abs: 2.8 10*3/uL (ref 0.7–4.0)
MCH: 27 pg (ref 26.0–34.0)
MCHC: 31.5 g/dL (ref 30.0–36.0)
MCV: 85.7 fL (ref 80.0–100.0)
Monocytes Absolute: 0.7 10*3/uL (ref 0.1–1.0)
Monocytes Relative: 9 %
Neutro Abs: 4.2 10*3/uL (ref 1.7–7.7)
Neutrophils Relative %: 52 %
Platelets: 311 10*3/uL (ref 150–400)
RBC: 3.78 MIL/uL — ABNORMAL LOW (ref 3.87–5.11)
RDW: 13.5 % (ref 11.5–15.5)
WBC: 8 10*3/uL (ref 4.0–10.5)
nRBC: 0 % (ref 0.0–0.2)

## 2021-06-26 LAB — CULTURE, BLOOD (ROUTINE X 2)
Special Requests: ADEQUATE
Special Requests: ADEQUATE

## 2021-06-26 LAB — COMPREHENSIVE METABOLIC PANEL
ALT: 32 U/L (ref 0–44)
AST: 20 U/L (ref 15–41)
Albumin: 2.2 g/dL — ABNORMAL LOW (ref 3.5–5.0)
Alkaline Phosphatase: 92 U/L (ref 38–126)
Anion gap: 8 (ref 5–15)
BUN: 7 mg/dL (ref 6–20)
CO2: 28 mmol/L (ref 22–32)
Calcium: 8.3 mg/dL — ABNORMAL LOW (ref 8.9–10.3)
Chloride: 103 mmol/L (ref 98–111)
Creatinine, Ser: 0.87 mg/dL (ref 0.44–1.00)
GFR, Estimated: >60 mL/min (ref >60)
Glucose, Bld: 119 mg/dL — ABNORMAL HIGH (ref 70–99)
Potassium: 3.5 mmol/L (ref 3.5–5.1)
Sodium: 139 mmol/L (ref 135–145)
Total Bilirubin: 0.4 mg/dL (ref 0.3–1.2)
Total Protein: 6.4 g/dL — ABNORMAL LOW (ref 6.5–8.1)

## 2021-06-26 LAB — ECHOCARDIOGRAM LIMITED
Height: 67 in
Weight: 4256 oz

## 2021-06-26 LAB — PHOSPHORUS: Phosphorus: 3 mg/dL (ref 2.5–4.6)

## 2021-06-26 LAB — MAGNESIUM: Magnesium: 1.6 mg/dL — ABNORMAL LOW (ref 1.7–2.4)

## 2021-06-26 MED ORDER — MAGNESIUM OXIDE -MG SUPPLEMENT 400 (240 MG) MG PO TABS
400.0000 mg | ORAL_TABLET | Freq: Two times a day (BID) | ORAL | Status: AC
  Administered 2021-06-26 – 2021-06-27 (×4): 400 mg via ORAL
  Filled 2021-06-26 (×4): qty 1

## 2021-06-26 MED ORDER — FUROSEMIDE 20 MG PO TABS
20.0000 mg | ORAL_TABLET | Freq: Every day | ORAL | Status: DC
  Administered 2021-06-26 – 2021-07-01 (×6): 20 mg via ORAL
  Filled 2021-06-26 (×6): qty 1

## 2021-06-26 NOTE — Progress Notes (Signed)
PROGRESS NOTE    [name]  VOJ:[id number] DOB: 12/09/1970 DOA: 06/22/2021 PCP: [name], NP   Chief Complaint  Patient presents with   Back Pain   Fever   Emesis    Brief Narrative:  Mary Chambers is Mary Chambers 50 y.o. female with medical history significant of hypertension, nephrolithiasis, morbid obesity, and prior back surgery back in 2020 who presents with complaints of back pain.  She was seen in urgent care and had CT abdomen pelvis without acute abnormalities and was discharged home on keflex.  Subsequently developed worsening symptoms, radiating pain down her R leg.  Fevers.  MRI was concerning for septic arthritis of the spine with possible abscess.    She's been admitted for treatment of her septic arthritis of lumbar spine complicated by abscess and  enterococcus bacteremia.  S/p IR drainage.  ID and neurosurgery following.    Assessment & Plan:   Principal Problem:   Sepsis ([place]) Active Problems:   Hypertension, benign   Morbidly obese (HCC)   Septic arthritis (HCC)   Transaminitis   Abscess   COVID-19 virus infection   Suspected UTI   Vertebral osteomyelitis ([place])  Enterococcus Faecalis Bacteremia  Septic Arthritis of Lumbar Spine  Paraspinal Abscess  Sepsis:  Patient presents with complaints of back pain with radiation down her right leg and fever noted up to 101.2 F with heart rates elevated up to 119 meeting SIRS criteria.  Labs significant for ESR 98 and WBC 11.2.  - MRI T/L spine notable for findings concerning for septic arthritis and abscess, diffuse epidural enhancement about the lower thecal sac at the levels of L4-5 and L5-S1 without frank epidural abscess - blood cx with enterococcus faecalis - s/p IR aspiration of right posterior paraspinal abscess - pending culture with rare enterococcus faecalis  - ID c/s, appreciate recs  - repeat blood cx pending  - echo pending - will need TEE  - ampicillin, ceftriaxone - NSGY  c/s, appreciate recs   Bilateral LE edema - follow echo - resume lasix  COVID-19 infection: Acute.  Patient reports that she has had Jowana Thumma nonproductive cough for few days now.  Initially thought symptoms were secondary to sinuses.  Noted to be incidentally noted to be positive for COVID-19. -symptoms mild, discussed use of antiviral like paxlovid, she's interested and agreeable (discussed with pharmacy) -hold lipitor while treating -needs 10 day isolation   Suspect urinary tract infection: Patient reported having urinary urgency with intermittent incontinence and not able to make it to the bathroom in time.  Urinalysis was significant for large leukocytes, rare bacteria, and 21-50 WBCs. -Follow-up urine culture (none sent, will follow up) -Patient on empiric antibiotics as seen above   Transaminitis: Acute.  Labs from 9/18 noted alkaline phosphatase 215, AST 88, and ALT 224.   -Check LFTs and hepatitis panel (negative) - improving, follow    Essential hypertension: Blood pressures are initially elevated at 161/90.  Home blood pressure medications include enalapril 10 mg daily, furosemide 20 mg daily, and hydrochlorothiazide 25 mg daily. -resuming lasix - plan to d/c HCTZ long term -Continue enalapril   Back pain with radicular: Acute on chronic.  Patient with prior history of lumbar fusion back in 2020.  Home medications included tramadol. -Pain medications as seen above   Hyperlipidemia -holding lipitor   Morbid obesity: BMI 41.66 kg per metered square.  Patient had been on phentermine in the outpatient setting. -Consider resuming phentermine in the outpatient setting  Nocturnal  Hypoxia - likely needs outpatient sleep study  DVT prophylaxis: lovenox Code Status:full Family Communication: none at bedside Disposition:   Status is: Inpatient  Remains inpatient appropriate because:Inpatient level of care appropriate due to severity of illness  Dispo: The patient is from: Home               Anticipated d/c is to: Home              Patient currently is not medically stable to d/c.   Difficult to place patient No       Consultants:  IR NSGY  Procedures:  IMPRESSION: Successful and uncomplicated fluoroscopy guided fine-needle aspiration of Julieana Eshleman right posterior paraspinal abscess at the L4-5 level.  Antimicrobials:  Anti-infectives (From admission, onward)    Start     Dose/Rate Route Frequency Ordered Stop   06/25/21 1415  cefTRIAXone (ROCEPHIN) 2 g in sodium chloride 0.9 % 100 mL IVPB        2 g 200 mL/hr over 30 Minutes Intravenous Every 12 hours 06/25/21 1324     06/24/21 2200  nirmatrelvir/ritonavir EUA (PAXLOVID) 3 tablet        3 tablet Oral 2 times daily 06/24/21 1622 06/29/21 2159   06/24/21 0030  ampicillin (OMNIPEN) 2 g in sodium chloride 0.9 % 100 mL IVPB        2 g 300 mL/hr over 20 Minutes Intravenous Every 4 hours 06/23/21 2342     06/24/21 0000  cefTRIAXone (ROCEPHIN) 2 g in sodium chloride 0.9 % 100 mL IVPB  Status:  Discontinued        2 g 200 mL/hr over 30 Minutes Intravenous Every 24 hours 06/23/21 0753 06/23/21 2342   06/23/21 1930  metroNIDAZOLE (FLAGYL) IVPB 500 mg  Status:  Discontinued        500 mg 100 mL/hr over 60 Minutes Intravenous Every 12 hours 06/23/21 0753 06/23/21 2342   06/23/21 1000  vancomycin (VANCOCIN) IVPB 1000 mg/200 mL premix  Status:  Discontinued        1,000 mg 200 mL/hr over 60 Minutes Intravenous Every 12 hours 06/23/21 0954 06/23/21 2342   06/22/21 2330  vancomycin (VANCOCIN) IVPB 1000 mg/200 mL premix        1,000 mg 200 mL/hr over 60 Minutes Intravenous  Once 06/22/21 2319 06/23/21 0731   06/22/21 2330  metroNIDAZOLE (FLAGYL) IVPB 500 mg        500 mg 100 mL/hr over 60 Minutes Intravenous  Once 06/22/21 2319 06/23/21 0838   06/22/21 2330  cefTRIAXone (ROCEPHIN) 1 g in sodium chloride 0.9 % 100 mL IVPB        1 g 200 mL/hr over 30 Minutes Intravenous  Once 06/22/21 2319 06/23/21 0109           Subjective: No new complaints  Objective: Vitals:   06/25/21 0859 06/25/21 1618 06/25/21 2121 06/26/21 1022  BP: 107/68 130/76 125/67 108/61  Pulse: 89 94 99 90  Resp: 18 (!) 22 20 (!) 22  Temp: 98.2 F (36.8 C) 99 F (37.2 C) 98.9 F (37.2 C) 98.6 F (37 C)  TempSrc: Oral Oral Oral Oral  SpO2: 98% 94% 99% 93%  Weight:      Height:        Intake/Output Summary (Last 24 hours) at 06/26/2021 1334 Last data filed at 06/26/2021 1015 Gross per 24 hour  Intake 800.66 ml  Output 1800 ml  Net -999.34 ml   Filed Weights   06/22/21 1940  Weight: 120.7  kg    Examination:  General: No acute distress. Cardiovascular: RRR Lungs: unlabored Abdomen: Soft, nontender, nondistended Neurological: Alert and oriented 3. Moves all extremities 4 . Cranial nerves II through XII grossly intact. Skin: Warm and dry. No rashes or lesions. Extremities: bilateral LE edema  Data Reviewed: I have personally reviewed following labs and imaging studies  CBC: Recent Labs  Lab 06/22/21 2257 06/24/21 1543 06/26/21 0110  WBC 11.2* 7.5 8.0  NEUTROABS 7.2 4.6 4.2  HGB 12.3 10.6* 10.2*  HCT 38.6 33.5* 32.4*  MCV 83.7 85.9 85.7  PLT 243 263 314    Basic Metabolic Panel: Recent Labs  Lab 06/22/21 2257 06/24/21 1543 06/25/21 0359 06/26/21 0110  NA 139 138  --  139  K 3.7 3.5  --  3.5  CL 100 104  --  103  CO2 26 26  --  28  GLUCOSE 96 131*  --  119*  BUN 16 7  --  7  CREATININE 0.89 0.77  --  0.87  CALCIUM 9.6 8.3*  --  8.3*  MG  --   --  1.8 1.6*  PHOS  --   --  3.2 3.0    GFR: Estimated Creatinine Clearance: 104.1 mL/min (by C-G formula based on SCr of 0.87 mg/dL).  Liver Function Tests: Recent Labs  Lab 06/23/21 1000 06/24/21 1543 06/26/21 0110  AST 21 28 20   ALT 50* 40 32  ALKPHOS 128* 105 92  BILITOT 0.7 0.4 0.4  PROT 6.6 6.4* 6.4*  ALBUMIN 2.5* 2.2* 2.2*    CBG: No results for input(s): GLUCAP in the last 168 hours.   Recent Results (from the past 240  hour(s))  Resp Panel by RT-PCR (Flu [facility] Real&B, Covid) Nasopharyngeal Swab     Status: Abnormal   Collection Time: 06/22/21 10:57 PM   Specimen: Nasopharyngeal Swab; Nasopharyngeal(NP) swabs in vial transport medium  Result Value Ref Range Status   SARS Coronavirus 2 by RT PCR POSITIVE (Xayden Linsey) NEGATIVE Final    Comment: RESULT CALLED TO, READ BACK BY AND VERIFIED WITH: Burgess Amor RN AT 0006 06/23/21 Ama (NOTE) SARS-CoV-2 target nucleic acids are DETECTED.  The SARS-CoV-2 RNA is generally detectable in upper respiratory specimens during the acute phase of infection. Positive results are indicative of the presence of the identified virus, but do not rule out bacterial infection or co-infection with other pathogens not detected by the test. Clinical correlation with patient history and other diagnostic information is necessary to determine patient infection status. The expected result is Negative.  Fact Sheet for Patients: EntrepreneurPulse.com.au  Fact Sheet for Healthcare Providers: IncredibleEmployment.be  This test is not yet approved or cleared by the [place] FDA and  has been authorized for detection and/or diagnosis of SARS-CoV-2 by FDA under an Emergency Use Authorization (EUA).  This EUA will remain in effect (meaning this test can be  used) for the duration of  the COVID-19 declaration under Section 564(b)(1) of the Act, 21 U.S.C. section 360bbb-3(b)(1), unless the authorization is terminated or revoked sooner.     Influenza Keyosha Tiedt by PCR NEGATIVE NEGATIVE Final   Influenza B by PCR NEGATIVE NEGATIVE Final    Comment: (NOTE) The Xpert Xpress SARS-CoV-2/FLU/RSV plus assay is intended as an aid in the diagnosis of influenza from Nasopharyngeal swab specimens and should not be used as Shavy Beachem sole basis for treatment. Nasal washings and aspirates are unacceptable for Xpert Xpress SARS-CoV-2/FLU/RSV testing.  Fact Sheet for  Patients: EntrepreneurPulse.com.au  Fact Sheet for Healthcare Providers:  IncredibleEmployment.be  This test is not yet approved or cleared by the [place] and has been authorized for detection and/or diagnosis of SARS-CoV-2 by FDA under an Emergency Use Authorization (EUA). This EUA will remain in effect (meaning this test can be used) for the duration of the COVID-19 declaration under Section 564(b)(1) of the Act, 21 U.S.C. section 360bbb-3(b)(1), unless the authorization is terminated or revoked.  Performed at [facility], [address] [place], [place] [address]   Blood culture (routine x 2)     Status: Abnormal   Collection Time: 06/22/21 11:22 PM   Specimen: BLOOD  Result Value Ref Range Status   Specimen Description   Final    BLOOD BOTTLES DRAWN AEROBIC AND ANAEROBIC Performed at [facility], [address] [place], [place] [address]    Special Requests   Final    Blood Culture adequate volume RIGHT ANTECUBITAL Performed at [facility], [place], [place] [address]    Culture  Setup Time   Final    GRAM POSITIVE COCCI IN BOTH AEROBIC AND ANAEROBIC BOTTLES CRITICAL RESULT CALLED TO, READ BACK BY AND VERIFIED WITH: PHARMD Milton PHAM 06/23/21@23 :25 BY TW Performed at [facility], [place] [address], [place],  [address]    Culture ENTEROCOCCUS FAECALIS (Chika Cichowski)  Final   Report Status 06/26/2021 FINAL  Final   Organism ID, Bacteria ENTEROCOCCUS FAECALIS  Final      Susceptibility   Enterococcus faecalis - MIC*    AMPICILLIN <=2 SENSITIVE Sensitive     VANCOMYCIN 1 SENSITIVE Sensitive     GENTAMICIN SYNERGY SENSITIVE Sensitive     * ENTEROCOCCUS FAECALIS  Blood Culture ID Panel (Reflexed)     Status: Abnormal   Collection Time: 06/22/21 11:22 PM  Result Value Ref Range Status   Enterococcus faecalis DETECTED (Jayliani Wanner) NOT DETECTED Final    Comment:  CRITICAL RESULT CALLED TO, READ BACK BY AND VERIFIED WITH: PHARMD MINH PHAM 06/23/21@23 :25 BY TW    Enterococcus Faecium NOT DETECTED NOT DETECTED Final   Listeria monocytogenes NOT DETECTED NOT DETECTED Final   Staphylococcus species NOT DETECTED NOT DETECTED Final   Staphylococcus aureus (BCID) NOT DETECTED NOT DETECTED Final   Staphylococcus epidermidis NOT DETECTED NOT DETECTED Final   Staphylococcus lugdunensis NOT DETECTED NOT DETECTED Final   Streptococcus species NOT DETECTED NOT DETECTED Final   Streptococcus agalactiae NOT DETECTED NOT DETECTED Final   Streptococcus pneumoniae NOT DETECTED NOT DETECTED Final   Streptococcus pyogenes NOT DETECTED NOT DETECTED Final   Thyra Yinger.calcoaceticus-baumannii NOT DETECTED NOT DETECTED Final   Bacteroides fragilis NOT DETECTED NOT DETECTED Final   Enterobacterales NOT DETECTED NOT DETECTED Final   Enterobacter cloacae complex NOT DETECTED NOT DETECTED Final   Escherichia coli NOT DETECTED NOT DETECTED Final   Klebsiella aerogenes NOT DETECTED NOT DETECTED Final   Klebsiella oxytoca NOT DETECTED NOT DETECTED Final   Klebsiella pneumoniae NOT DETECTED NOT DETECTED Final   Proteus species NOT DETECTED NOT DETECTED Final   Salmonella species NOT DETECTED NOT DETECTED Final   Serratia marcescens NOT DETECTED NOT DETECTED Final   Haemophilus influenzae NOT DETECTED NOT DETECTED Final   Neisseria meningitidis NOT DETECTED NOT DETECTED Final   Pseudomonas aeruginosa NOT DETECTED NOT DETECTED Final   Stenotrophomonas maltophilia NOT DETECTED NOT DETECTED Final   Candida albicans NOT DETECTED NOT DETECTED Final   Candida auris NOT DETECTED NOT DETECTED Final   Candida glabrata NOT DETECTED NOT DETECTED Final   Candida krusei  NOT DETECTED NOT DETECTED Final   Candida parapsilosis NOT DETECTED NOT DETECTED Final   Candida tropicalis NOT DETECTED NOT DETECTED Final   Cryptococcus neoformans/gattii NOT DETECTED NOT DETECTED Final   Vancomycin resistance  NOT DETECTED NOT DETECTED Final    Comment: Performed at [facility], [place] [address] [place], [place] [address]  Blood culture (routine x 2)     Status: Abnormal   Collection Time: 06/22/21 11:35 PM   Specimen: BLOOD LEFT WRIST  Result Value Ref Range Status   Specimen Description BLOOD LEFT WRIST  Final   Special Requests   Final    BOTTLES DRAWN AEROBIC AND ANAEROBIC Blood Culture adequate volume   Culture  Setup Time   Final    GRAM POSITIVE COCCI IN BOTH AEROBIC AND ANAEROBIC BOTTLES CRITICAL VALUE NOTED.  VALUE IS CONSISTENT WITH PREVIOUSLY REPORTED AND CALLED VALUE.    Culture (Kwabena Strutz)  Final    ENTEROCOCCUS FAECALIS SUSCEPTIBILITIES PERFORMED ON PREVIOUS CULTURE WITHIN THE LAST 5 DAYS. Performed at [facility], [place] [address], [place], [place] [address]    Report Status 06/26/2021 FINAL  Final  Aerobic/Anaerobic Culture w Gram Stain (surgical/deep wound)     Status: None (Preliminary result)   Collection Time: 06/23/21  2:52 PM   Specimen: Back; Tissue  Result Value Ref Range Status   Specimen Description ABSCESS  Final   Special Requests PARASPINAL L4 L5  Final   Gram Stain   Final    MODERATE WBC PRESENT,BOTH PMN AND MONONUCLEAR NO ORGANISMS SEEN Performed at [facility], [address], [place], [place] [address]    Culture   Final    RARE ENTEROCOCCUS FAECALIS NO ANAEROBES ISOLATED; CULTURE IN PROGRESS FOR 5 DAYS    Report Status PENDING  Incomplete   Organism ID, Bacteria ENTEROCOCCUS FAECALIS  Final      Susceptibility   Enterococcus faecalis - MIC*    AMPICILLIN <=2 SENSITIVE Sensitive     VANCOMYCIN 1 SENSITIVE Sensitive     GENTAMICIN SYNERGY SENSITIVE Sensitive     * RARE ENTEROCOCCUS FAECALIS  Culture, blood (routine x 2)     Status: None (Preliminary result)   Collection Time: 06/24/21  3:44 PM   Specimen: BLOOD  Result Value Ref Range Status   Specimen Description BLOOD LEFT ANTECUBITAL  Final   Special Requests   Final    BOTTLES  DRAWN AEROBIC AND ANAEROBIC Blood Culture adequate volume   Culture   Final    NO GROWTH < 12 HOURS Performed at [facility], [address] [place], [place] [address]    Report Status PENDING  Incomplete  Culture, blood (routine x 2)     Status: None (Preliminary result)   Collection Time: 06/24/21  4:00 PM   Specimen: BLOOD LEFT HAND  Result Value Ref Range Status   Specimen Description BLOOD LEFT HAND  Final   Special Requests   Final    BOTTLES DRAWN AEROBIC ONLY Blood Culture adequate volume   Culture   Final    NO GROWTH < 12 HOURS Performed at [facility], [place] [address] [place], [place] [address]    Report Status PENDING  Incomplete         Radiology Studies: No results found.      Scheduled Meds:  vitamin C  500 mg Oral Daily   [START ON 07/01/2021] atorvastatin  10 mg Oral Daily   enalapril  10 mg Oral Daily  enoxaparin (LOVENOX) injection  40 mg Subcutaneous Q24H   furosemide  20 mg Oral Daily   magnesium oxide  400 mg Oral BID   nirmatrelvir/ritonavir EUA  3 tablet Oral BID   pantoprazole  40 mg Oral Daily   sodium chloride flush  3 mL Intravenous Q12H   zinc sulfate  220 mg Oral Daily   Continuous Infusions:  sodium chloride 10 mL/hr at 06/25/21 0700   ampicillin (OMNIPEN) IV 2 g (06/26/21 1328)   cefTRIAXone (ROCEPHIN)  IV 2 g (06/26/21 1046)     LOS: 3 days    Time spent: over 30 min    Fayrene Helper, MD [facility]   To contact the attending provider between 7A-7P or the covering provider during after hours 7P-7A, please log into the web site [email or web address] and access using universal [place] password for that web site. If you do not have the password, please call the hospital operator.  06/26/2021, 1:34 PM

## 2021-06-26 NOTE — Progress Notes (Signed)
Physical Therapy Note  PT eval complete with full note to follow;  Recommend going Home with HHPT f/u for transition out of hospital;  Will need RW, 3in1, shower chair Will follow,   Roney Marion, [place] Pager [phone] Office [phone]

## 2021-06-26 NOTE — Evaluation (Signed)
Physical Therapy Evaluation Patient Details Name: Mary Chambers MRN: [id number] DOB: 07-23-1971 Today's Date: 06/26/2021  History of Present Illness  Mary Chambers is a 50 y.o. female who presents with complaints of back pain. Subsequently developed worsening symptoms, radiating pain down her R leg.  Fevers.  MRI was concerning for septic arthritis of the spine with possible abscess. s/p IR aspiration of right posterior paraspinal abscess, culture with rare enterococcus faecalis; incidental covid positive finding as well;  with medical history significant of hypertension, nephrolithiasis, morbid obesity, and prior back surgery back in 2020  Clinical Impression   Pt admitted with above diagnosis. Comes from home where she lives with family in a two-story hoem (pt stays on main level) with a few steps to enter;  independent at baseline, sorks, walks with a cane, enjoys time with her grandson; Presents to PT with back pain limiting activity tolerance, but overall moving well; ambulated in the room with RW and min assist; Pt currently with functional limitations due to the deficits listed below (see PT Problem List). Pt will benefit from skilled PT to increase their independence and safety with mobility to allow discharge to the venue listed below.          Recommendations for follow up therapy are one component of a multi-disciplinary discharge planning process, led by the attending physician.  Recommendations may be updated based on patient status, additional functional criteria and insurance authorization.  Follow Up Recommendations Home health PT;Other (comment) (May progress well enough to not need HHPT)    Equipment Recommendations  Rolling walker with 5" wheels;3in1 (PT);Other (comment) (shower seat)    Recommendations for Other Services OT consult (will order per protocol)     Precautions / Restrictions Precautions Precautions: Back Precaution Comments: Back  precautions for comfort      Mobility  Bed Mobility Overal bed mobility: Needs Assistance Bed Mobility: Rolling;Sidelying to Sit Rolling: Min guard Sidelying to sit: Min guard       General bed mobility comments: Smooth motion without muhc difficulty; minguard for safety    Transfers Overall transfer level: Needs assistance Equipment used: Rolling walker (2 wheeled) Transfers: Sit to/from Stand Sit to Stand: Min guard         General transfer comment: Minguard for safety; stood form bed and toilet  Ambulation/Gait Ambulation/Gait assistance: Min guard Gait Distance (Feet): 20 Feet Assistive device: Rolling walker (2 wheeled) Gait Pattern/deviations: Step-through pattern     General Gait Details: Trunk slightly flexed  Stairs            Wheelchair Mobility    Modified Rankin (Stroke Patients Only)       Balance Overall balance assessment: Needs assistance   Sitting balance-Leahy Scale: Fair (approaching good)       Standing balance-Leahy Scale: Poor (approaching fair)                               Pertinent Vitals/Pain Pain Assessment: 0-10 Pain Score: 6  Pain Location: back and R leg Pain Descriptors / Indicators: Aching Pain Intervention(s): Monitored during session    Home Living Family/patient expects to be discharged to:: Private residence Living Arrangements: Children;Other relatives;Non-relatives/Friends Available Help at Discharge: Family;Available PRN/intermittently Type of Home: House Home Access: Stairs to enter   Entrance Stairs-Number of Steps: 1 Home Layout: One level;Able to live on main level with bedroom/bathroom Home Equipment: Kasandra Knudsen - single point  Prior Function Level of Independence: Independent         Comments: drives, still works; enjoys taking her lunch outside     Hand Dominance   Dominant Hand: Right    Extremity/Trunk Assessment   Upper Extremity Assessment Upper Extremity  Assessment: Overall WFL for tasks assessed    Lower Extremity Assessment Lower Extremity Assessment: Generalized weakness (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)    Cervical / Trunk Assessment Cervical / Trunk Assessment: Other exceptions Cervical / Trunk Exceptions: Infection in back; post IR aspiration for culture  Communication   Communication: No difficulties  Cognition Arousal/Alertness: Awake/alert Behavior During Therapy: WFL for tasks assessed/performed Overall Cognitive Status: Within Functional Limits for tasks assessed                                        General Comments General comments (skin integrity, edema, etc.): Session conducted on room air; O2 sats ranged 90-96%    Exercises     Assessment/Plan    PT Assessment Patient needs continued PT services  PT Problem List Decreased strength;Decreased activity tolerance;Decreased balance;Decreased mobility;Decreased knowledge of use of DME;Pain       PT Treatment Interventions DME instruction;Gait training;Stair training;Functional mobility training;Therapeutic activities;Therapeutic exercise;Balance training;Patient/family education    PT Goals (Current goals can be found in the Care Plan section)  Acute Rehab PT Goals Patient Stated Goal: be able to return to work PT Goal Formulation: With patient Time For Goal Achievement: 07/10/21 Potential to Achieve Goals: Good    Frequency Min 3X/week   Barriers to discharge        Co-evaluation               AM-PAC PT "6 Clicks" Mobility  Outcome Measure Help needed turning from your back to your side while in a flat bed without using bedrails?: None Help needed moving from lying on your back to sitting on the side of a flat bed without using bedrails?: None Help needed moving to and from a bed to a chair (including a wheelchair)?: None Help needed standing up from a chair using your arms (e.g., wheelchair or bedside chair)?: A Little Help  needed to walk in hospital room?: A Little Help needed climbing 3-5 steps with a railing? : A Little 6 Click Score: 21    End of Session   Activity Tolerance: Patient tolerated treatment well Patient left: in chair;with call bell/phone within reach Nurse Communication: Mobility status PT Visit Diagnosis: Other abnormalities of gait and mobility (R26.89)    Time: 3903-0092 PT Time Calculation (min) (ACUTE ONLY): 38 min   Charges:   PT Evaluation $PT Eval Low Complexity: 1 Low PT Treatments $Gait Training: 8-22 mins $Therapeutic Activity: 8-22 mins        Roney Marion, PT  Acute Rehabilitation Services Pager [phone] Office [phone]   Colletta [place] 06/26/2021, 3:27 PM

## 2021-06-26 NOTE — Progress Notes (Signed)
  Echocardiogram 2D Echocardiogram has been performed.  [place] Deputy Jianni Shelden RDCS 06/26/2021, 2:55 PM

## 2021-06-27 DIAGNOSIS — A419 Sepsis, unspecified organism: Secondary | ICD-10-CM | POA: Diagnosis not present

## 2021-06-27 DIAGNOSIS — R6520 Severe sepsis without septic shock: Secondary | ICD-10-CM | POA: Diagnosis not present

## 2021-06-27 DIAGNOSIS — M4650 Other infective spondylopathies, site unspecified: Secondary | ICD-10-CM

## 2021-06-27 DIAGNOSIS — L0291 Cutaneous abscess, unspecified: Secondary | ICD-10-CM | POA: Diagnosis not present

## 2021-06-27 LAB — COMPREHENSIVE METABOLIC PANEL
ALT: 33 U/L (ref 0–44)
AST: 22 U/L (ref 15–41)
Albumin: 2.7 g/dL — ABNORMAL LOW (ref 3.5–5.0)
Alkaline Phosphatase: 105 U/L (ref 38–126)
Anion gap: 11 (ref 5–15)
BUN: 8 mg/dL (ref 6–20)
CO2: 29 mmol/L (ref 22–32)
Calcium: 8.9 mg/dL (ref 8.9–10.3)
Chloride: 100 mmol/L (ref 98–111)
Creatinine, Ser: 0.92 mg/dL (ref 0.44–1.00)
GFR, Estimated: >60 mL/min (ref >60)
Glucose, Bld: 87 mg/dL (ref 70–99)
Potassium: 3.4 mmol/L — ABNORMAL LOW (ref 3.5–5.1)
Sodium: 140 mmol/L (ref 135–145)
Total Bilirubin: 0.5 mg/dL (ref 0.3–1.2)
Total Protein: 8.3 g/dL — ABNORMAL HIGH (ref 6.5–8.1)

## 2021-06-27 LAB — CBC WITH DIFFERENTIAL/PLATELET
Abs Immature Granulocytes: 0.03 10*3/uL (ref 0.00–0.07)
Basophils Absolute: 0 10*3/uL (ref 0.0–0.1)
Basophils Relative: 1 %
Eosinophils Absolute: 0.2 10*3/uL (ref 0.0–0.5)
Eosinophils Relative: 3 %
HCT: 36.3 % (ref 36.0–46.0)
Hemoglobin: 11.4 g/dL — ABNORMAL LOW (ref 12.0–15.0)
Immature Granulocytes: 0 %
Lymphocytes Relative: 30 %
Lymphs Abs: 2.2 10*3/uL (ref 0.7–4.0)
MCH: 26.9 pg (ref 26.0–34.0)
MCHC: 31.4 g/dL (ref 30.0–36.0)
MCV: 85.6 fL (ref 80.0–100.0)
Monocytes Absolute: 0.6 10*3/uL (ref 0.1–1.0)
Monocytes Relative: 8 %
Neutro Abs: 4.2 10*3/uL (ref 1.7–7.7)
Neutrophils Relative %: 58 %
Platelets: 312 10*3/uL (ref 150–400)
RBC: 4.24 MIL/uL (ref 3.87–5.11)
RDW: 13.5 % (ref 11.5–15.5)
WBC: 7.2 10*3/uL (ref 4.0–10.5)
nRBC: 0 % (ref 0.0–0.2)

## 2021-06-27 LAB — PHOSPHORUS: Phosphorus: 3.2 mg/dL (ref 2.5–4.6)

## 2021-06-27 LAB — MAGNESIUM: Magnesium: 2 mg/dL (ref 1.7–2.4)

## 2021-06-27 LAB — CYTOLOGY - NON PAP

## 2021-06-27 MED ORDER — TRAZODONE HCL 50 MG PO TABS
50.0000 mg | ORAL_TABLET | Freq: Every day | ORAL | Status: DC
  Administered 2021-06-27 – 2021-06-30 (×4): 50 mg via ORAL
  Filled 2021-06-27 (×4): qty 1

## 2021-06-27 MED ORDER — SODIUM CHLORIDE 0.9 % IV SOLN: INTRAVENOUS | Status: DC

## 2021-06-27 NOTE — Progress Notes (Signed)
    [facility] has been requested to perform a transesophageal echocardiogram on 06/28/21 for bacteremia.  After careful review of history and examination, the risks and benefits of transesophageal echocardiogram have been explained including risks of esophageal damage, perforation (1:10,000 risk), bleeding, pharyngeal hematoma as well as other potential complications associated with conscious sedation including aspiration, arrhythmia, respiratory failure and death. Alternatives to treatment were discussed, questions were answered. Patient is willing to proceed.   TEE scheduled for 06/28/21 at 2pm with Dr. Stanford Breed.  Roby Lofts, PA-C 06/27/2021 10:09 AM

## 2021-06-27 NOTE — Progress Notes (Addendum)
PROGRESS NOTE    Mary Chambers  KCL:[id number] DOB: 08/10/1971 DOA: 06/22/2021 PCP: [name], NP   Chief Complaint  Patient presents with   Back Pain   Fever   Emesis    Brief Narrative:  Mary Chambers is Mary Chambers 50 y.o. female with medical history significant of hypertension, nephrolithiasis, morbid obesity, and prior back surgery back in 2020 who presents with complaints of back pain.  She was seen in urgent care and had CT abdomen pelvis without acute abnormalities and was discharged home on keflex.  Subsequently developed worsening symptoms, radiating pain down her R leg.  Fevers.  MRI was concerning for septic arthritis of the spine with possible abscess.    She's been admitted for treatment of her septic arthritis of lumbar spine complicated by abscess and  enterococcus bacteremia.  S/p IR drainage.  ID and neurosurgery following.    Assessment & Plan:   Principal Problem:   Sepsis ([place]) Active Problems:   Hypertension, benign   Morbidly obese (HCC)   Septic arthritis (HCC)   Transaminitis   Abscess   COVID-19 virus infection   Suspected UTI   Vertebral osteomyelitis ([place])  Enterococcus Faecalis Bacteremia  Septic Arthritis of Lumbar Spine  Paraspinal Abscess  Sepsis:  Patient presents with complaints of back pain with radiation down her right leg and fever noted up to 101.2 F with heart rates elevated up to 119 meeting SIRS criteria.  Labs significant for ESR 98 and WBC 11.2.  - MRI T/L spine notable for findings concerning for septic arthritis and abscess, diffuse epidural enhancement about the lower thecal sac at the levels of L4-5 and L5-S1 without frank epidural abscess - blood cx with enterococcus faecalis - s/p IR aspiration of right posterior paraspinal abscess - pending culture with rare enterococcus faecalis  - ID c/s, appreciate recs  - repeat blood cx pending  - echo EF 55-60%, can't rule out vegetation (see report) - will need  TEE (planned for tomorrow by cards)  - ampicillin, ceftriaxone - NSGY c/s, appreciate recs   Bilateral LE edema - follow echo -> EF 55-60%, cannot r/o vegetation AV not well seen - resume lasix  COVID-19 infection: Acute.  Patient reports that she has had Courteney Alderete nonproductive cough for few days now.  Initially thought symptoms were secondary to sinuses.  Noted to be incidentally noted to be positive for COVID-19. -symptoms mild, discussed use of antiviral like paxlovid, she's interested and agreeable (discussed with pharmacy) -hold lipitor while treating -needs 10 day isolation   Suspect urinary tract infection: Patient reported having urinary urgency with intermittent incontinence and not able to make it to the bathroom in time.  Urinalysis was significant for large leukocytes, rare bacteria, and 21-50 WBCs. -Follow-up urine culture (none sent, will follow up) -Patient on empiric antibiotics as seen above   Transaminitis: Acute.  Labs from 9/18 noted alkaline phosphatase 215, AST 88, and ALT 224.   -Check LFTs and hepatitis panel (negative) - improving, follow    Essential hypertension: Blood pressures are initially elevated at 161/90.  Home blood pressure medications include enalapril 10 mg daily, furosemide 20 mg daily, and hydrochlorothiazide 25 mg daily. -resuming lasix - plan to d/c HCTZ long term -Continue enalapril   Back pain with radicular: Acute on chronic.  Patient with prior history of lumbar fusion back in 2020.  Home medications included tramadol. -Pain medications as seen above   Hyperlipidemia -holding lipitor while on paxlovid   Morbid obesity:  BMI 41.66 kg per metered square.  Patient had been on phentermine in the outpatient setting. -Consider resuming phentermine in the outpatient setting  Nocturnal Hypoxia - likely needs outpatient sleep study  Insomnia - will try trazodone  DVT prophylaxis: lovenox Code Status:full Family Communication: none at  bedside Disposition:   Status is: Inpatient  Remains inpatient appropriate because:Inpatient level of care appropriate due to severity of illness  Dispo: The patient is from: Home              Anticipated d/c is to: Home              Patient currently is not medically stable to d/c.   Difficult to place patient No       Consultants:  IR NSGY  Procedures:  IMPRESSION: Successful and uncomplicated fluoroscopy guided fine-needle aspiration of Mary Chambers right posterior paraspinal abscess at the L4-5 level.  Echo IMPRESSIONS     1. Poor quality study bad acoustic windows limited.   2. Left ventricular ejection fraction, by estimation, is 55 to 60%. The  left ventricle has normal function. Left ventricular diastolic parameters  are indeterminate.   3. The mitral valve was not well visualized. No evidence of mitral valve  regurgitation.   4. AV not well seen Some calcification and ? mild to moderate AR Cannot  r/o vegetation Suggest TEE if clinically indicated . The aortic valve was  not well visualized.   5. The inferior vena cava is normal in size with greater than 50%  respiratory variability, suggesting right atrial pressure of 3 mmHg.  Antimicrobials:  Anti-infectives (From admission, onward)    Start     Dose/Rate Route Frequency Ordered Stop   06/25/21 1415  cefTRIAXone (ROCEPHIN) 2 g in sodium chloride 0.9 % 100 mL IVPB        2 g 200 mL/hr over 30 Minutes Intravenous Every 12 hours 06/25/21 1324     06/24/21 2200  nirmatrelvir/ritonavir EUA (PAXLOVID) 3 tablet        3 tablet Oral 2 times daily 06/24/21 1622 06/29/21 2159   06/24/21 0030  ampicillin (OMNIPEN) 2 g in sodium chloride 0.9 % 100 mL IVPB        2 g 300 mL/hr over 20 Minutes Intravenous Every 4 hours 06/23/21 2342     06/24/21 0000  cefTRIAXone (ROCEPHIN) 2 g in sodium chloride 0.9 % 100 mL IVPB  Status:  Discontinued        2 g 200 mL/hr over 30 Minutes Intravenous Every 24 hours 06/23/21 0753 06/23/21  2342   06/23/21 1930  metroNIDAZOLE (FLAGYL) IVPB 500 mg  Status:  Discontinued        500 mg 100 mL/hr over 60 Minutes Intravenous Every 12 hours 06/23/21 0753 06/23/21 2342   06/23/21 1000  vancomycin (VANCOCIN) IVPB 1000 mg/200 mL premix  Status:  Discontinued        1,000 mg 200 mL/hr over 60 Minutes Intravenous Every 12 hours 06/23/21 0954 06/23/21 2342   06/22/21 2330  vancomycin (VANCOCIN) IVPB 1000 mg/200 mL premix        1,000 mg 200 mL/hr over 60 Minutes Intravenous  Once 06/22/21 2319 06/23/21 0731   06/22/21 2330  metroNIDAZOLE (FLAGYL) IVPB 500 mg        500 mg 100 mL/hr over 60 Minutes Intravenous  Once 06/22/21 2319 06/23/21 0838   06/22/21 2330  cefTRIAXone (ROCEPHIN) 1 g in sodium chloride 0.9 % 100 mL IVPB  1 g 200 mL/hr over 30 Minutes Intravenous  Once 06/22/21 2319 06/23/21 0109          Subjective: C/o pain to back Upset about food coming late  Objective: Vitals:   06/26/21 1538 06/26/21 2016 06/27/21 0350 06/27/21 0942  BP: 109/67 113/77 107/66 105/61  Pulse: 99 80 83 85  Resp: (!) _0 Temp: 99.5 F (37.5 C) 98.3 F (36.8 C) 98 F (36.7 C)   TempSrc: Oral Oral Oral   SpO2: 93% 98% 99% 100%  Weight:      Height:        Intake/Output Summary (Last 24 hours) at 06/27/2021 1822 Last data filed at 06/27/2021 1600 Gross per 24 hour  Intake 2077.17 ml  Output 2800 ml  Net -722.83 ml   Filed Weights   06/22/21 1940  Weight: 120.7 kg    Examination:  General: No acute distress. Cardiovascular: RRR Lungs: unlabored Abdomen: Soft, nontender, nondistended Neurological: Alert and oriented 3. Moves all extremities 4 . Cranial nerves II through XII grossly intact. Skin: Warm and dry. No rashes or lesions. Extremities: No clubbing or cyanosis. No edema.    Data Reviewed: I have personally reviewed following labs and imaging studies  CBC: Recent Labs  Lab 06/22/21 2257 06/24/21 1543 06/26/21 0110 06/27/21 0831  WBC 11.2*  7.5 8.0 7.2  NEUTROABS 7.2 4.6 4.2 4.2  HGB 12.3 10.6* 10.2* 11.4*  HCT 38.6 33.5* 32.4* 36.3  MCV 83.7 85.9 85.7 85.6  PLT 243 263 311 536    Basic Metabolic Panel: Recent Labs  Lab 06/22/21 2257 06/24/21 1543 06/25/21 0359 06/26/21 0110 06/27/21 0030  NA 139 138  --  139 140  K 3.7 3.5  --  3.5 3.4*  CL 100 104  --  103 100  CO2 26 26  --  28 29  GLUCOSE 96 131*  --  119* 87  BUN 16 7  --  7 8  CREATININE 0.89 0.77  --  0.87 0.92  CALCIUM 9.6 8.3*  --  8.3* 8.9  MG  --   --  1.8 1.6* 2.0  PHOS  --   --  3.2 3.0 3.2    GFR: Estimated Creatinine Clearance: 98.4 mL/min (by C-G formula based on SCr of 0.92 mg/dL).  Liver Function Tests: Recent Labs  Lab 06/23/21 1000 06/24/21 1543 06/26/21 0110 06/27/21 0030  AST _1 ALT 50* 40 32 33  ALKPHOS 128* 105 92 105  BILITOT 0.7 0.4 0.4 0.5  PROT 6.6 6.4* 6.4* 8.3*  ALBUMIN 2.5* 2.2* 2.2* 2.7*    CBG: No results for input(s): GLUCAP in the last 168 hours.   Recent Results (from the past 240 hour(s))  Resp Panel by RT-PCR (Flu Shaylah Mcghie&B, Covid) Nasopharyngeal Swab     Status: Abnormal   Collection Time: 06/22/21 10:57 PM   Specimen: Nasopharyngeal Swab; Nasopharyngeal(NP) swabs in vial transport medium  Result Value Ref Range Status   SARS Coronavirus 2 by RT PCR POSITIVE (Isami Mehra) NEGATIVE Final    Comment: RESULT CALLED TO, READ BACK BY AND VERIFIED WITH: Burgess Amor RN AT 0006 06/23/21 [place] (NOTE) SARS-CoV-2 target nucleic acids are DETECTED.  The SARS-CoV-2 RNA is generally detectable in upper respiratory specimens during the acute phase of infection. Positive results are indicative of the presence of the identified virus, but do not rule out bacterial infection or co-infection with other pathogens not detected by the test. Clinical correlation with patient history and  other diagnostic information is necessary to determine patient infection status. The expected result is Negative.  Fact Sheet for  Patients: EntrepreneurPulse.com.au  Fact Sheet for Healthcare Providers: IncredibleEmployment.be  This test is not yet approved or cleared by the [place] FDA and  has been authorized for detection and/or diagnosis of SARS-CoV-2 by FDA under an Emergency Use Authorization (EUA).  This EUA will remain in effect (meaning this test can be  used) for the duration of  the COVID-19 declaration under Section 564(b)(1) of the Act, 21 U.S.C. section 360bbb-3(b)(1), unless the authorization is terminated or revoked sooner.     Influenza Aleza Pew by PCR NEGATIVE NEGATIVE Final   Influenza B by PCR NEGATIVE NEGATIVE Final    Comment: (NOTE) The Xpert Xpress SARS-CoV-2/FLU/RSV plus assay is intended as an aid in the diagnosis of influenza from Nasopharyngeal swab specimens and should not be used as [place] Pender sole basis for treatment. Nasal washings and aspirates are unacceptable for Xpert Xpress SARS-CoV-2/FLU/RSV testing.  Fact Sheet for Patients: EntrepreneurPulse.com.au  Fact Sheet for Healthcare Providers: IncredibleEmployment.be  This test is not yet approved or cleared by the [place] FDA and has been authorized for detection and/or diagnosis of SARS-CoV-2 by FDA under an Emergency Use Authorization (EUA). This EUA will remain in effect (meaning this test can be used) for the duration of the COVID-19 declaration under Section 564(b)(1) of the Act, 21 U.S.C. section 360bbb-3(b)(1), unless the authorization is terminated or revoked.  Performed at [facility], [address], [place], [place] [address]   Blood culture (routine x 2)     Status: Abnormal   Collection Time: 06/22/21 11:22 PM   Specimen: BLOOD  Result Value Ref Range Status   Specimen Description   Final    BLOOD BOTTLES DRAWN AEROBIC AND ANAEROBIC Performed at [facility], [address], [place], [place]  [address]    Special Requests   Final    Blood Culture adequate volume RIGHT ANTECUBITAL Performed at [facility], [place], [place] [address]    Culture  Setup Time   Final    GRAM POSITIVE COCCI IN BOTH AEROBIC AND ANAEROBIC BOTTLES CRITICAL RESULT CALLED TO, READ BACK BY AND VERIFIED WITH: PHARMD Grayson PHAM 06/23/21_0 :25 BY TW Performed at [facility], [place] [address], [place], [place] [address]    Culture ENTEROCOCCUS FAECALIS (Felicia Bloomquist)  Final   Report Status 06/26/2021 FINAL  Final   Organism ID, Bacteria ENTEROCOCCUS FAECALIS  Final      Susceptibility   Enterococcus faecalis - MIC*    AMPICILLIN <=2 SENSITIVE Sensitive     VANCOMYCIN 1 SENSITIVE Sensitive     GENTAMICIN SYNERGY SENSITIVE Sensitive     * ENTEROCOCCUS FAECALIS  Blood Culture ID Panel (Reflexed)     Status: Abnormal   Collection Time: 06/22/21 11:22 PM  Result Value Ref Range Status   Enterococcus faecalis DETECTED (Cambrey Lupi) NOT DETECTED Final    Comment: CRITICAL RESULT CALLED TO, READ BACK BY AND VERIFIED WITH: PHARMD [place] PHAM 06/23/21_1 :25 BY TW    Enterococcus Faecium NOT DETECTED NOT DETECTED Final   Listeria monocytogenes NOT DETECTED NOT DETECTED Final   Staphylococcus species NOT DETECTED NOT DETECTED Final   Staphylococcus aureus (BCID) NOT DETECTED NOT DETECTED Final   Staphylococcus epidermidis NOT DETECTED NOT DETECTED Final   Staphylococcus lugdunensis NOT DETECTED NOT DETECTED Final   Streptococcus species NOT DETECTED NOT DETECTED Final   Streptococcus agalactiae NOT DETECTED NOT DETECTED Final  Streptococcus pneumoniae NOT DETECTED NOT DETECTED Final   Streptococcus pyogenes NOT DETECTED NOT DETECTED Final   Roselie Cirigliano.calcoaceticus-baumannii NOT DETECTED NOT DETECTED Final   Bacteroides fragilis NOT DETECTED NOT DETECTED Final   Enterobacterales NOT DETECTED NOT DETECTED Final   Enterobacter cloacae complex NOT DETECTED NOT DETECTED Final   Escherichia coli NOT  DETECTED NOT DETECTED Final   Klebsiella aerogenes NOT DETECTED NOT DETECTED Final   Klebsiella oxytoca NOT DETECTED NOT DETECTED Final   Klebsiella pneumoniae NOT DETECTED NOT DETECTED Final   Proteus species NOT DETECTED NOT DETECTED Final   Salmonella species NOT DETECTED NOT DETECTED Final   Serratia marcescens NOT DETECTED NOT DETECTED Final   Haemophilus influenzae NOT DETECTED NOT DETECTED Final   Neisseria meningitidis NOT DETECTED NOT DETECTED Final   Pseudomonas aeruginosa NOT DETECTED NOT DETECTED Final   Stenotrophomonas maltophilia NOT DETECTED NOT DETECTED Final   Candida albicans NOT DETECTED NOT DETECTED Final   Candida auris NOT DETECTED NOT DETECTED Final   Candida glabrata NOT DETECTED NOT DETECTED Final   Candida krusei NOT DETECTED NOT DETECTED Final   Candida parapsilosis NOT DETECTED NOT DETECTED Final   Candida tropicalis NOT DETECTED NOT DETECTED Final   Cryptococcus neoformans/gattii NOT DETECTED NOT DETECTED Final   Vancomycin resistance NOT DETECTED NOT DETECTED Final    Comment: Performed at [facility], [address], [place], [place] [address]  Blood culture (routine x 2)     Status: Abnormal   Collection Time: 06/22/21 11:35 PM   Specimen: BLOOD LEFT WRIST  Result Value Ref Range Status   Specimen Description BLOOD LEFT WRIST  Final   Special Requests   Final    BOTTLES DRAWN AEROBIC AND ANAEROBIC Blood Culture adequate volume   Culture  Setup Time   Final    GRAM POSITIVE COCCI IN BOTH AEROBIC AND ANAEROBIC BOTTLES CRITICAL VALUE NOTED.  VALUE IS CONSISTENT WITH PREVIOUSLY REPORTED AND CALLED VALUE.    Culture (Milica Gully)  Final    ENTEROCOCCUS FAECALIS SUSCEPTIBILITIES PERFORMED ON PREVIOUS CULTURE WITHIN THE LAST 5 DAYS. Performed at [facility], [place] [address] [place], [place] [address]    Report Status 06/26/2021 FINAL  Final  Aerobic/Anaerobic Culture w Gram Stain (surgical/deep wound)     Status: None (Preliminary result)    Collection Time: 06/23/21  2:52 PM   Specimen: Back; Tissue  Result Value Ref Range Status   Specimen Description ABSCESS  Final   Special Requests PARASPINAL L4 L5  Final   Gram Stain   Final    MODERATE WBC PRESENT,BOTH PMN AND MONONUCLEAR NO ORGANISMS SEEN Performed at [facility], [address], [place], [place] [address]    Culture   Final    RARE ENTEROCOCCUS FAECALIS NO ANAEROBES ISOLATED; CULTURE IN PROGRESS FOR 5 DAYS    Report Status PENDING  Incomplete   Organism ID, Bacteria ENTEROCOCCUS FAECALIS  Final      Susceptibility   Enterococcus faecalis - MIC*    AMPICILLIN <=2 SENSITIVE Sensitive     VANCOMYCIN 1 SENSITIVE Sensitive     GENTAMICIN SYNERGY SENSITIVE Sensitive     * RARE ENTEROCOCCUS FAECALIS  Culture, blood (routine x 2)     Status: None (Preliminary result)   Collection Time: 06/24/21  3:44 PM   Specimen: BLOOD  Result Value Ref Range Status   Specimen Description BLOOD LEFT ANTECUBITAL  Final   Special Requests   Final    BOTTLES DRAWN AEROBIC AND ANAEROBIC Blood  Culture adequate volume   Culture   Final    NO GROWTH 3 DAYS Performed at [facility], [place] [address], [place], [place] [address]    Report Status PENDING  Incomplete  Culture, blood (routine x 2)     Status: None (Preliminary result)   Collection Time: 06/24/21  4:00 PM   Specimen: BLOOD LEFT HAND  Result Value Ref Range Status   Specimen Description BLOOD LEFT HAND  Final   Special Requests   Final    BOTTLES DRAWN AEROBIC ONLY Blood Culture adequate volume   Culture   Final    NO GROWTH 3 DAYS Performed at [facility], [place] [address], [place], [place] [address]    Report Status PENDING  Incomplete         Radiology Studies: ECHOCARDIOGRAM LIMITED  Result Date: 06/26/2021    ECHOCARDIOGRAM LIMITED REPORT   Patient Name:   FIDENCIA MCCLOUD Date of Exam: 06/26/2021 Medical Rec #:  [id number]                 Height:       67.0 in Accession #:     [id number]                Weight:       266.0 lb Date of Birth:  14-Feb-1971                 BSA:          2.283 m Patient Age:    31 years                  BP:           108/51 mmHg Patient Gender: F                         HR:           102 bpm. Exam Location:  Inpatient Procedure: Limited Echo, Color Doppler and Cardiac Doppler Indications:    Bacteremia R78.81  History:        Patient has prior history of Echocardiogram examinations, most                 recent 05/26/2020. Risk Factors:Hypertension. Prior performed at                 [place]:    [place] Referring Phys: [phone] GREGORY D CALONE  Sonographer Comments: COVID+ at time of study IMPRESSIONS  1. Poor quality study bad acoustic windows limited.  2. Left ventricular ejection fraction, by estimation, is 55 to 60%. The left ventricle has normal function. Left ventricular diastolic parameters are indeterminate.  3. The mitral valve was not well visualized. No evidence of mitral valve regurgitation.  4. AV not well seen Some calcification and ? mild to moderate AR Cannot r/o vegetation Suggest TEE if clinically indicated . The aortic valve was not well visualized.  5. The inferior vena cava is normal in size with greater than 50% respiratory variability, suggesting right atrial pressure of 3 mmHg. FINDINGS  Left Ventricle: Left ventricular ejection fraction, by estimation, is 55 to 60%. The left ventricle has normal function. The left ventricular internal cavity size was normal in size. Left ventricular diastolic parameters are indeterminate. Pericardium: There is no evidence of pericardial effusion. Mitral Valve: The mitral valve was not well visualized. Tricuspid Valve: The tricuspid valve is not well visualized.  Aortic Valve: AV not well seen Some calcification and ? mild to moderate AR Cannot r/o vegetation Suggest TEE if clinically indicated. The aortic valve was not well visualized. Pulmonic Valve: The pulmonic valve was not well  visualized. Venous: The inferior vena cava is normal in size with greater than 50% respiratory variability, suggesting right atrial pressure of 3 mmHg. Additional Comments: Poor quality study bad acoustic windows limited. AORTIC VALVE LVOT Vmax:   79.80 cm/s LVOT Vmean:  54.200 cm/s LVOT VTI:    0.152 m  SHUNTS Systemic VTI: 0.15 m Jenkins Rouge MD Electronically signed by Jenkins Rouge MD Signature Date/Time: 06/26/2021/3:42:56 PM    Final         Scheduled Meds:  vitamin C  500 mg Oral Daily   [START ON 07/01/2021] atorvastatin  10 mg Oral Daily   enalapril  10 mg Oral Daily   enoxaparin (LOVENOX) injection  40 mg Subcutaneous Q24H   furosemide  20 mg Oral Daily   magnesium oxide  400 mg Oral BID   nirmatrelvir/ritonavir EUA  3 tablet Oral BID   pantoprazole  40 mg Oral Daily   sodium chloride flush  3 mL Intravenous Q12H   traZODone  50 mg Oral QHS   zinc sulfate  220 mg Oral Daily   Continuous Infusions:  sodium chloride 50 mL/hr at 06/27/21 0940   [START ON 06/28/2021] sodium chloride     ampicillin (OMNIPEN) IV 2 g (06/27/21 1552)   cefTRIAXone (ROCEPHIN)  IV 2 g (06/27/21 1410)     LOS: 4 days    Time spent: over 30 min    Fayrene Helper, MD [facility]   To contact the attending provider between 7A-7P or the covering provider during after hours 7P-7A, please log into the web site [email or web address] and access using universal [place] password for that web site. If you do not have the password, please call the hospital operator.  06/27/2021, 6:22 PM

## 2021-06-27 NOTE — H&P (View-Only) (Signed)
PROGRESS NOTE    Mary Chambers  KCL:[id number] DOB: 08/10/1971 DOA: 06/22/2021 PCP: [name], NP   Chief Complaint  Patient presents with   Back Pain   Fever   Emesis    Brief Narrative:  Mary Chambers is Mary Chambers 50 y.o. female with medical history significant of hypertension, nephrolithiasis, morbid obesity, and prior back surgery back in 2020 who presents with complaints of back pain.  She was seen in urgent care and had CT abdomen pelvis without acute abnormalities and was discharged home on keflex.  Subsequently developed worsening symptoms, radiating pain down her R leg.  Fevers.  MRI was concerning for septic arthritis of the spine with possible abscess.    She's been admitted for treatment of her septic arthritis of lumbar spine complicated by abscess and  enterococcus bacteremia.  S/p IR drainage.  ID and neurosurgery following.    Assessment & Plan:   Principal Problem:   Sepsis ([place]) Active Problems:   Hypertension, benign   Morbidly obese (HCC)   Septic arthritis (HCC)   Transaminitis   Abscess   COVID-19 virus infection   Suspected UTI   Vertebral osteomyelitis ([place])  Enterococcus Faecalis Bacteremia  Septic Arthritis of Lumbar Spine  Paraspinal Abscess  Sepsis:  Patient presents with complaints of back pain with radiation down her right leg and fever noted up to 101.2 F with heart rates elevated up to 119 meeting SIRS criteria.  Labs significant for ESR 98 and WBC 11.2.  - MRI T/L spine notable for findings concerning for septic arthritis and abscess, diffuse epidural enhancement about the lower thecal sac at the levels of L4-5 and L5-S1 without frank epidural abscess - blood cx with enterococcus faecalis - s/p IR aspiration of right posterior paraspinal abscess - pending culture with rare enterococcus faecalis  - ID c/s, appreciate recs  - repeat blood cx pending  - echo EF 55-60%, can't rule out vegetation (see report) - will need  TEE (planned for tomorrow by cards)  - ampicillin, ceftriaxone - NSGY c/s, appreciate recs   Bilateral LE edema - follow echo -> EF 55-60%, cannot r/o vegetation AV not well seen - resume lasix  COVID-19 infection: Acute.  Patient reports that she has had Mary Chambers nonproductive cough for few days now.  Initially thought symptoms were secondary to sinuses.  Noted to be incidentally noted to be positive for COVID-19. -symptoms mild, discussed use of antiviral like paxlovid, she's interested and agreeable (discussed with pharmacy) -hold lipitor while treating -needs 10 day isolation   Suspect urinary tract infection: Patient reported having urinary urgency with intermittent incontinence and not able to make it to the bathroom in time.  Urinalysis was significant for large leukocytes, rare bacteria, and 21-50 WBCs. -Follow-up urine culture (none sent, will follow up) -Patient on empiric antibiotics as seen above   Transaminitis: Acute.  Labs from 9/18 noted alkaline phosphatase 215, AST 88, and ALT 224.   -Check LFTs and hepatitis panel (negative) - improving, follow    Essential hypertension: Blood pressures are initially elevated at 161/90.  Home blood pressure medications include enalapril 10 mg daily, furosemide 20 mg daily, and hydrochlorothiazide 25 mg daily. -resuming lasix - plan to d/c HCTZ long term -Continue enalapril   Back pain with radicular: Acute on chronic.  Patient with prior history of lumbar fusion back in 2020.  Home medications included tramadol. -Pain medications as seen above   Hyperlipidemia -holding lipitor while on paxlovid   Morbid obesity:  BMI 41.66 kg per metered square.  Patient had been on phentermine in the outpatient setting. -Consider resuming phentermine in the outpatient setting  Nocturnal Hypoxia - likely needs outpatient sleep study  Insomnia - will try trazodone  DVT prophylaxis: lovenox Code Status:full Family Communication: none at  bedside Disposition:   Status is: Inpatient  Remains inpatient appropriate because:Inpatient level of care appropriate due to severity of illness  Dispo: The patient is from: Home              Anticipated d/c is to: Home              Patient currently is not medically stable to d/c.   Difficult to place patient No       Consultants:  IR NSGY  Procedures:  IMPRESSION: Successful and uncomplicated fluoroscopy guided fine-needle aspiration of Mary Chambers right posterior paraspinal abscess at the L4-5 level.  Echo IMPRESSIONS     1. Poor quality study bad acoustic windows limited.   2. Left ventricular ejection fraction, by estimation, is 55 to 60%. The  left ventricle has normal function. Left ventricular diastolic parameters  are indeterminate.   3. The mitral valve was not well visualized. No evidence of mitral valve  regurgitation.   4. AV not well seen Some calcification and ? mild to moderate AR Cannot  r/o vegetation Suggest TEE if clinically indicated . The aortic valve was  not well visualized.   5. The inferior vena cava is normal in size with greater than 50%  respiratory variability, suggesting right atrial pressure of 3 mmHg.  Antimicrobials:  Anti-infectives (From admission, onward)    Start     Dose/Rate Route Frequency Ordered Stop   06/25/21 1415  cefTRIAXone (ROCEPHIN) 2 g in sodium chloride 0.9 % 100 mL IVPB        2 g 200 mL/hr over 30 Minutes Intravenous Every 12 hours 06/25/21 1324     06/24/21 2200  nirmatrelvir/ritonavir EUA (PAXLOVID) 3 tablet        3 tablet Oral 2 times daily 06/24/21 1622 06/29/21 2159   06/24/21 0030  ampicillin (OMNIPEN) 2 g in sodium chloride 0.9 % 100 mL IVPB        2 g 300 mL/hr over 20 Minutes Intravenous Every 4 hours 06/23/21 2342     06/24/21 0000  cefTRIAXone (ROCEPHIN) 2 g in sodium chloride 0.9 % 100 mL IVPB  Status:  Discontinued        2 g 200 mL/hr over 30 Minutes Intravenous Every 24 hours 06/23/21 0753 06/23/21  2342   06/23/21 1930  metroNIDAZOLE (FLAGYL) IVPB 500 mg  Status:  Discontinued        500 mg 100 mL/hr over 60 Minutes Intravenous Every 12 hours 06/23/21 0753 06/23/21 2342   06/23/21 1000  vancomycin (VANCOCIN) IVPB 1000 mg/200 mL premix  Status:  Discontinued        1,000 mg 200 mL/hr over 60 Minutes Intravenous Every 12 hours 06/23/21 0954 06/23/21 2342   06/22/21 2330  vancomycin (VANCOCIN) IVPB 1000 mg/200 mL premix        1,000 mg 200 mL/hr over 60 Minutes Intravenous  Once 06/22/21 2319 06/23/21 0731   06/22/21 2330  metroNIDAZOLE (FLAGYL) IVPB 500 mg        500 mg 100 mL/hr over 60 Minutes Intravenous  Once 06/22/21 2319 06/23/21 0838   06/22/21 2330  cefTRIAXone (ROCEPHIN) 1 g in sodium chloride 0.9 % 100 mL IVPB  1 g 200 mL/hr over 30 Minutes Intravenous  Once 06/22/21 2319 06/23/21 0109          Subjective: C/o pain to back Upset about food coming late  Objective: Vitals:   06/26/21 1538 06/26/21 2016 06/27/21 0350 06/27/21 0942  BP: 109/67 113/77 107/66 105/61  Pulse: 99 80 83 85  Resp: (!) _0 Temp: 99.5 F (37.5 C) 98.3 F (36.8 C) 98 F (36.7 C)   TempSrc: Oral Oral Oral   SpO2: 93% 98% 99% 100%  Weight:      Height:        Intake/Output Summary (Last 24 hours) at 06/27/2021 1822 Last data filed at 06/27/2021 1600 Gross per 24 hour  Intake 2077.17 ml  Output 2800 ml  Net -722.83 ml   Filed Weights   06/22/21 1940  Weight: 120.7 kg    Examination:  General: No acute distress. Cardiovascular: RRR Lungs: unlabored Abdomen: Soft, nontender, nondistended Neurological: Alert and oriented 3. Moves all extremities 4 . Cranial nerves II through XII grossly intact. Skin: Warm and dry. No rashes or lesions. Extremities: No clubbing or cyanosis. No edema.    Data Reviewed: I have personally reviewed following labs and imaging studies  CBC: Recent Labs  Lab 06/22/21 2257 06/24/21 1543 06/26/21 0110 06/27/21 0831  WBC 11.2*  7.5 8.0 7.2  NEUTROABS 7.2 4.6 4.2 4.2  HGB 12.3 10.6* 10.2* 11.4*  HCT 38.6 33.5* 32.4* 36.3  MCV 83.7 85.9 85.7 85.6  PLT 243 263 311 536    Basic Metabolic Panel: Recent Labs  Lab 06/22/21 2257 06/24/21 1543 06/25/21 0359 06/26/21 0110 06/27/21 0030  NA 139 138  --  139 140  K 3.7 3.5  --  3.5 3.4*  CL 100 104  --  103 100  CO2 26 26  --  28 29  GLUCOSE 96 131*  --  119* 87  BUN 16 7  --  7 8  CREATININE 0.89 0.77  --  0.87 0.92  CALCIUM 9.6 8.3*  --  8.3* 8.9  MG  --   --  1.8 1.6* 2.0  PHOS  --   --  3.2 3.0 3.2    GFR: Estimated Creatinine Clearance: 98.4 mL/min (by C-G formula based on SCr of 0.92 mg/dL).  Liver Function Tests: Recent Labs  Lab 06/23/21 1000 06/24/21 1543 06/26/21 0110 06/27/21 0030  AST _1 ALT 50* 40 32 33  ALKPHOS 128* 105 92 105  BILITOT 0.7 0.4 0.4 0.5  PROT 6.6 6.4* 6.4* 8.3*  ALBUMIN 2.5* 2.2* 2.2* 2.7*    CBG: No results for input(s): GLUCAP in the last 168 hours.   Recent Results (from the past 240 hour(s))  Resp Panel by RT-PCR (Flu Meriel Kelliher&B, Covid) Nasopharyngeal Swab     Status: Abnormal   Collection Time: 06/22/21 10:57 PM   Specimen: Nasopharyngeal Swab; Nasopharyngeal(NP) swabs in vial transport medium  Result Value Ref Range Status   SARS Coronavirus 2 by RT PCR POSITIVE (Mary Chambers) NEGATIVE Final    Comment: RESULT CALLED TO, READ BACK BY AND VERIFIED WITH: Mary Chambers AT 0006 06/23/21 Mary Chambers (NOTE) SARS-CoV-2 target nucleic acids are DETECTED.  The SARS-CoV-2 RNA is generally detectable in upper respiratory specimens during the acute phase of infection. Positive results are indicative of the presence of the identified virus, but do not rule out bacterial infection or co-infection with other pathogens not detected by the test. Clinical correlation with patient history and  other diagnostic information is necessary to determine patient infection status. The expected result is Negative.  Fact Sheet for  Patients: EntrepreneurPulse.com.au  Fact Sheet for Healthcare Providers: Mary Chambers  This test is not yet approved or cleared by the [place] FDA and  has been authorized for detection and/or diagnosis of SARS-CoV-2 by FDA under an Emergency Use Authorization (EUA).  This EUA will remain in effect (meaning this test can be  used) for the duration of  the COVID-19 declaration under Section 564(b)(1) of the Act, 21 U.S.C. section 360bbb-3(b)(1), unless the authorization is terminated or revoked sooner.     Influenza Corine Solorio by PCR NEGATIVE NEGATIVE Final   Influenza B by PCR NEGATIVE NEGATIVE Final    Comment: (NOTE) The Xpert Xpress SARS-CoV-2/FLU/RSV plus assay is intended as an aid in the diagnosis of influenza from Nasopharyngeal swab specimens and should not be used as Mary Chambers sole basis for treatment. Nasal washings and aspirates are unacceptable for Xpert Xpress SARS-CoV-2/FLU/RSV testing.  Fact Sheet for Patients: EntrepreneurPulse.com.au  Fact Sheet for Healthcare Providers: Mary Chambers  This test is not yet approved or cleared by the [place] FDA and has been authorized for detection and/or diagnosis of SARS-CoV-2 by FDA under an Emergency Use Authorization (EUA). This EUA will remain in effect (meaning this test can be used) for the duration of the COVID-19 declaration under Section 564(b)(1) of the Act, 21 U.S.C. section 360bbb-3(b)(1), unless the authorization is terminated or revoked.  Performed at [facility], [address], [place], [place] [address]   Blood culture (routine x 2)     Status: Abnormal   Collection Time: 06/22/21 11:22 PM   Specimen: BLOOD  Result Value Ref Range Status   Specimen Description   Final    BLOOD BOTTLES DRAWN AEROBIC AND ANAEROBIC Performed at [facility], [address], [place], [place]  [address]    Special Requests   Final    Blood Culture adequate volume RIGHT ANTECUBITAL Performed at [facility], [place], [place] [address]    Culture  Setup Time   Final    GRAM POSITIVE COCCI IN BOTH AEROBIC AND ANAEROBIC BOTTLES CRITICAL RESULT CALLED TO, READ BACK BY AND VERIFIED WITH: PHARMD Grayson PHAM 06/23/21_0 :25 BY TW Performed at [facility], [place] [address], [place], [place] [address]    Culture ENTEROCOCCUS FAECALIS (Shawanda Sievert)  Final   Report Status 06/26/2021 FINAL  Final   Organism ID, Bacteria ENTEROCOCCUS FAECALIS  Final      Susceptibility   Enterococcus faecalis - MIC*    AMPICILLIN <=2 SENSITIVE Sensitive     VANCOMYCIN 1 SENSITIVE Sensitive     GENTAMICIN SYNERGY SENSITIVE Sensitive     * ENTEROCOCCUS FAECALIS  Blood Culture ID Panel (Reflexed)     Status: Abnormal   Collection Time: 06/22/21 11:22 PM  Result Value Ref Range Status   Enterococcus faecalis DETECTED (Arien Morine) NOT DETECTED Final    Comment: CRITICAL RESULT CALLED TO, READ BACK BY AND VERIFIED WITH: PHARMD [place] PHAM 06/23/21_1 :25 BY TW    Enterococcus Faecium NOT DETECTED NOT DETECTED Final   Listeria monocytogenes NOT DETECTED NOT DETECTED Final   Staphylococcus species NOT DETECTED NOT DETECTED Final   Staphylococcus aureus (BCID) NOT DETECTED NOT DETECTED Final   Staphylococcus epidermidis NOT DETECTED NOT DETECTED Final   Staphylococcus lugdunensis NOT DETECTED NOT DETECTED Final   Streptococcus species NOT DETECTED NOT DETECTED Final   Streptococcus agalactiae NOT DETECTED NOT DETECTED Final  Streptococcus pneumoniae NOT DETECTED NOT DETECTED Final   Streptococcus pyogenes NOT DETECTED NOT DETECTED Final   Arieonna Medine.calcoaceticus-baumannii NOT DETECTED NOT DETECTED Final   Bacteroides fragilis NOT DETECTED NOT DETECTED Final   Enterobacterales NOT DETECTED NOT DETECTED Final   Enterobacter cloacae complex NOT DETECTED NOT DETECTED Final   Escherichia coli NOT  DETECTED NOT DETECTED Final   Klebsiella aerogenes NOT DETECTED NOT DETECTED Final   Klebsiella oxytoca NOT DETECTED NOT DETECTED Final   Klebsiella pneumoniae NOT DETECTED NOT DETECTED Final   Proteus species NOT DETECTED NOT DETECTED Final   Salmonella species NOT DETECTED NOT DETECTED Final   Serratia marcescens NOT DETECTED NOT DETECTED Final   Haemophilus influenzae NOT DETECTED NOT DETECTED Final   Neisseria meningitidis NOT DETECTED NOT DETECTED Final   Pseudomonas aeruginosa NOT DETECTED NOT DETECTED Final   Stenotrophomonas maltophilia NOT DETECTED NOT DETECTED Final   Candida albicans NOT DETECTED NOT DETECTED Final   Candida auris NOT DETECTED NOT DETECTED Final   Candida glabrata NOT DETECTED NOT DETECTED Final   Candida krusei NOT DETECTED NOT DETECTED Final   Candida parapsilosis NOT DETECTED NOT DETECTED Final   Candida tropicalis NOT DETECTED NOT DETECTED Final   Cryptococcus neoformans/gattii NOT DETECTED NOT DETECTED Final   Vancomycin resistance NOT DETECTED NOT DETECTED Final    Comment: Performed at [facility], [address], [place], [place] [address]  Blood culture (routine x 2)     Status: Abnormal   Collection Time: 06/22/21 11:35 PM   Specimen: BLOOD LEFT WRIST  Result Value Ref Range Status   Specimen Description BLOOD LEFT WRIST  Final   Special Requests   Final    BOTTLES DRAWN AEROBIC AND ANAEROBIC Blood Culture adequate volume   Culture  Setup Time   Final    GRAM POSITIVE COCCI IN BOTH AEROBIC AND ANAEROBIC BOTTLES CRITICAL VALUE NOTED.  VALUE IS CONSISTENT WITH PREVIOUSLY REPORTED AND CALLED VALUE.    Culture (Kirbi Farrugia)  Final    ENTEROCOCCUS FAECALIS SUSCEPTIBILITIES PERFORMED ON PREVIOUS CULTURE WITHIN THE LAST 5 DAYS. Performed at [facility], [place] [address] [place], [place] [address]    Report Status 06/26/2021 FINAL  Final  Aerobic/Anaerobic Culture w Gram Stain (surgical/deep wound)     Status: None (Preliminary result)    Collection Time: 06/23/21  2:52 PM   Specimen: Back; Tissue  Result Value Ref Range Status   Specimen Description ABSCESS  Final   Special Requests PARASPINAL L4 L5  Final   Gram Stain   Final    MODERATE WBC PRESENT,BOTH PMN AND MONONUCLEAR NO ORGANISMS SEEN Performed at [facility], [address], [place], [place] [address]    Culture   Final    RARE ENTEROCOCCUS FAECALIS NO ANAEROBES ISOLATED; CULTURE IN PROGRESS FOR 5 DAYS    Report Status PENDING  Incomplete   Organism ID, Bacteria ENTEROCOCCUS FAECALIS  Final      Susceptibility   Enterococcus faecalis - MIC*    AMPICILLIN <=2 SENSITIVE Sensitive     VANCOMYCIN 1 SENSITIVE Sensitive     GENTAMICIN SYNERGY SENSITIVE Sensitive     * RARE ENTEROCOCCUS FAECALIS  Culture, blood (routine x 2)     Status: None (Preliminary result)   Collection Time: 06/24/21  3:44 PM   Specimen: BLOOD  Result Value Ref Range Status   Specimen Description BLOOD LEFT ANTECUBITAL  Final   Special Requests   Final    BOTTLES DRAWN AEROBIC AND ANAEROBIC Blood  Culture adequate volume   Culture   Final    NO GROWTH 3 DAYS Performed at [facility], [place] [address], [place], [place] [address]    Report Status PENDING  Incomplete  Culture, blood (routine x 2)     Status: None (Preliminary result)   Collection Time: 06/24/21  4:00 PM   Specimen: BLOOD LEFT HAND  Result Value Ref Range Status   Specimen Description BLOOD LEFT HAND  Final   Special Requests   Final    BOTTLES DRAWN AEROBIC ONLY Blood Culture adequate volume   Culture   Final    NO GROWTH 3 DAYS Performed at [facility], [place] [address], [place], [place] [address]    Report Status PENDING  Incomplete         Radiology Studies: ECHOCARDIOGRAM LIMITED  Result Date: 06/26/2021    ECHOCARDIOGRAM LIMITED REPORT   Patient Name:   Mary Chambers Date of Exam: 06/26/2021 Medical Rec #:  [id number]                 Height:       67.0 in Accession #:     [id number]                Weight:       266.0 lb Date of Birth:  14-Feb-1971                 BSA:          2.283 m Patient Age:    31 years                  BP:           108/51 mmHg Patient Gender: F                         HR:           102 bpm. Exam Location:  Inpatient Procedure: Limited Echo, Color Doppler and Cardiac Doppler Indications:    Bacteremia R78.81  History:        Patient has prior history of Echocardiogram examinations, most                 recent 05/26/2020. Risk Factors:Hypertension. Prior performed at                 [place]:    [place] Referring Phys: [phone] GREGORY D CALONE  Sonographer Comments: COVID+ at time of study IMPRESSIONS  1. Poor quality study bad acoustic windows limited.  2. Left ventricular ejection fraction, by estimation, is 55 to 60%. The left ventricle has normal function. Left ventricular diastolic parameters are indeterminate.  3. The mitral valve was not well visualized. No evidence of mitral valve regurgitation.  4. AV not well seen Some calcification and ? mild to moderate AR Cannot r/o vegetation Suggest TEE if clinically indicated . The aortic valve was not well visualized.  5. The inferior vena cava is normal in size with greater than 50% respiratory variability, suggesting right atrial pressure of 3 mmHg. FINDINGS  Left Ventricle: Left ventricular ejection fraction, by estimation, is 55 to 60%. The left ventricle has normal function. The left ventricular internal cavity size was normal in size. Left ventricular diastolic parameters are indeterminate. Pericardium: There is no evidence of pericardial effusion. Mitral Valve: The mitral valve was not well visualized. Tricuspid Valve: The tricuspid valve is not well visualized.  Aortic Valve: AV not well seen Some calcification and ? mild to moderate AR Cannot r/o vegetation Suggest TEE if clinically indicated. The aortic valve was not well visualized. Pulmonic Valve: The pulmonic valve was not well  visualized. Venous: The inferior vena cava is normal in size with greater than 50% respiratory variability, suggesting right atrial pressure of 3 mmHg. Additional Comments: Poor quality study bad acoustic windows limited. AORTIC VALVE LVOT Vmax:   79.80 cm/s LVOT Vmean:  54.200 cm/s LVOT VTI:    0.152 m  SHUNTS Systemic VTI: 0.15 m Jenkins Rouge MD Electronically signed by Jenkins Rouge MD Signature Date/Time: 06/26/2021/3:42:56 PM    Final         Scheduled Meds:  vitamin C  500 mg Oral Daily   [START ON 07/01/2021] atorvastatin  10 mg Oral Daily   enalapril  10 mg Oral Daily   enoxaparin (LOVENOX) injection  40 mg Subcutaneous Q24H   furosemide  20 mg Oral Daily   magnesium oxide  400 mg Oral BID   nirmatrelvir/ritonavir EUA  3 tablet Oral BID   pantoprazole  40 mg Oral Daily   sodium chloride flush  3 mL Intravenous Q12H   traZODone  50 mg Oral QHS   zinc sulfate  220 mg Oral Daily   Continuous Infusions:  sodium chloride 50 mL/hr at 06/27/21 0940   [START ON 06/28/2021] sodium chloride     ampicillin (OMNIPEN) IV 2 g (06/27/21 1552)   cefTRIAXone (ROCEPHIN)  IV 2 g (06/27/21 1410)     LOS: 4 days    Time spent: over 30 min    Fayrene Helper, MD [facility]   To contact the attending provider between 7A-7P or the covering provider during after hours 7P-7A, please log into the web site [email or web address] and access using universal [place] password for that web site. If you do not have the password, please call the hospital operator.  06/27/2021, 6:22 PM

## 2021-06-27 NOTE — Progress Notes (Signed)
Subjective: Patient reports that she has had some improvement in her symptoms, but continues to have severe pain. No acute events overnight.   Objective: Vital signs in last 24 hours: Temp:  [98 F (36.7 C)-99.5 F (37.5 C)] 98 F (36.7 C) (09/26 0350) Pulse Rate:  [80-99] 83 (09/26 0350) Resp:  [18-22] 18 (09/26 0350) BP: (107-113)/(61-77) 107/66 (09/26 0350) SpO2:  [93 %-99 %] 99 % (09/26 0350)  Intake/Output from previous day: 09/25 0701 - 09/26 0700 In: 2343.3 [P.O.:860; I.V.:382; IV Piggyback:1101.3] Out: 2300 [Urine:2300] Intake/Output this shift: No intake/output data recorded.  Physical Exam: Patient is awake, A/O X 4, and conversant. She is laying comfortably in bed in NAD. VSS. Speech is fluent and appropriate. MAEW with good strength. Sensation to light touch is intact. PERLA, EOMI. CNs grossly intact.   Lab Results: Recent Labs    06/24/21 1543 06/26/21 0110  WBC 7.5 8.0  HGB 10.6* 10.2*  HCT 33.5* 32.4*  PLT 263 311   BMET Recent Labs    06/26/21 0110 06/27/21 0030  NA 139 140  K 3.5 3.4*  CL 103 100  CO2 28 29  GLUCOSE 119* 87  BUN 7 8  CREATININE 0.87 0.92  CALCIUM 8.3* 8.9    Studies/Results: ECHOCARDIOGRAM LIMITED  Result Date: 06/26/2021    ECHOCARDIOGRAM LIMITED REPORT   Patient Name:   PAISLYN DOMENICO Date of Exam: 06/26/2021 Medical Rec #:  [id number]                 Height:       67.0 in Accession #:    [id number]                Weight:       266.0 lb Date of Birth:  [birth year]                 BSA:          2.283 m Patient Age:    50 years                  BP:           108/51 mmHg Patient Gender: F                         HR:           102 bpm. Exam Location:  Inpatient Procedure: Limited Echo, Color Doppler and Cardiac Doppler Indications:    Bacteremia R78.81  History:        Patient has prior history of Echocardiogram examinations, most                 recent 05/26/2020. Risk Factors:Hypertension. Prior performed at                  [facility]:    [facility] Referring Phys: [phone] GREGORY D CALONE  Sonographer Comments: COVID+ at time of study IMPRESSIONS  1. Poor quality study bad acoustic windows limited.  2. Left ventricular ejection fraction, by estimation, is 55 to 60%. The left ventricle has normal function. Left ventricular diastolic parameters are indeterminate.  3. The mitral valve was not well visualized. No evidence of mitral valve regurgitation.  4. AV not well seen Some calcification and ? mild to moderate AR Cannot r/o vegetation Suggest TEE if clinically indicated . The aortic valve was not well visualized.  5. The inferior vena cava is normal in size with  greater than 50% respiratory variability, suggesting right atrial pressure of 3 mmHg. FINDINGS  Left Ventricle: Left ventricular ejection fraction, by estimation, is 55 to 60%. The left ventricle has normal function. The left ventricular internal cavity size was normal in size. Left ventricular diastolic parameters are indeterminate. Pericardium: There is no evidence of pericardial effusion. Mitral Valve: The mitral valve was not well visualized. Tricuspid Valve: The tricuspid valve is not well visualized. Aortic Valve: AV not well seen Some calcification and ? mild to moderate AR Cannot r/o vegetation Suggest TEE if clinically indicated. The aortic valve was not well visualized. Pulmonic Valve: The pulmonic valve was not well visualized. Venous: The inferior vena cava is normal in size with greater than 50% respiratory variability, suggesting right atrial pressure of 3 mmHg. Additional Comments: Poor quality study bad acoustic windows limited. AORTIC VALVE LVOT Vmax:   79.80 cm/s LVOT Vmean:  54.200 cm/s LVOT VTI:    0.152 m  SHUNTS Systemic VTI: 0.15 m Jenkins Rouge MD Electronically signed by Jenkins Rouge MD Signature Date/Time: 06/26/2021/3:42:56 PM    Final     Assessment/Plan: 50 y.o. female with acute inflammatory changes in the right paraspinous  tissue of the lumbar spine with L2-L5 septic arthritis. She continues to be at her neurological baseline and is showing some signs of improvement. Continue abx per primary/ID. No new neurosurgical recommendations at this time. Call with any questions.   LOS: 4 days     Marvis Moeller, DNP, NP-C 06/27/2021, 8:46 AM

## 2021-06-27 NOTE — Progress Notes (Signed)
[facility] for Infectious Disease   Reason for visit: Follow up on paraspinous infection with septic arthritis  Interval History: no complaints today, remains afebrile, WBC 7.2.   Day 6 total antibiotics Day 5 ampicillin  Physical Exam: Constitutional:  Vitals:   06/27/21 0350 06/27/21 0942  BP: 107/66 105/61  Pulse: 83 85  Resp: 18 18  Temp: 98 F (36.7 C)   SpO2: 99% 100%   patient appears in NAD Respiratory: Normal respiratory effort; CTA B Cardiovascular: RRR GI: soft, nt, nd  Review of Systems: Constitutional: negative for fevers and chills Gastrointestinal: negative for nausea and diarrhea Integument/breast: negative for rash  Lab Results  Component Value Date   WBC 7.2 06/27/2021   HGB 11.4 (L) 06/27/2021   HCT 36.3 06/27/2021   MCV 85.6 06/27/2021   PLT 312 06/27/2021    Lab Results  Component Value Date   CREATININE 0.92 06/27/2021   BUN 8 06/27/2021   NA 140 06/27/2021   K 3.4 (L) 06/27/2021   CL 100 06/27/2021   CO2 29 06/27/2021    Lab Results  Component Value Date   ALT 33 06/27/2021   AST 22 06/27/2021   ALKPHOS 105 06/27/2021     Microbiology: Recent Results (from the past 240 hour(s))  Resp Panel by RT-PCR (Flu A&B, Covid) Nasopharyngeal Swab     Status: Abnormal   Collection Time: 06/22/21 10:57 PM   Specimen: Nasopharyngeal Swab; Nasopharyngeal(NP) swabs in vial transport medium  Result Value Ref Range Status   SARS Coronavirus 2 by RT PCR POSITIVE (A) NEGATIVE Final    Comment: RESULT CALLED TO, READ BACK BY AND VERIFIED WITH: Burgess Amor RN AT 0006 06/23/21 CH (NOTE) SARS-CoV-2 target nucleic acids are DETECTED.  The SARS-CoV-2 RNA is generally detectable in upper respiratory specimens during the acute phase of infection. Positive results are indicative of the presence of the identified virus, but do not rule out bacterial infection or co-infection with other pathogens not detected by the test. Clinical correlation with  patient history and other diagnostic information is necessary to determine patient infection status. The expected result is Negative.  Fact Sheet for Patients: EntrepreneurPulse.com.au  Fact Sheet for Healthcare Providers: IncredibleEmployment.be  This test is not yet approved or cleared by the [place] FDA and  has been authorized for detection and/or diagnosis of SARS-CoV-2 by FDA under an Emergency Use Authorization (EUA).  This EUA will remain in effect (meaning this test can be  used) for the duration of  the COVID-19 declaration under Section 564(b)(1) of the Act, 21 U.S.C. section 360bbb-3(b)(1), unless the authorization is terminated or revoked sooner.     Influenza A by PCR NEGATIVE NEGATIVE Final   Influenza B by PCR NEGATIVE NEGATIVE Final    Comment: (NOTE) The Xpert Xpress SARS-CoV-2/FLU/RSV plus assay is intended as an aid in the diagnosis of influenza from Nasopharyngeal swab specimens and should not be used as a sole basis for treatment. Nasal washings and aspirates are unacceptable for Xpert Xpress SARS-CoV-2/FLU/RSV testing.  Fact Sheet for Patients: EntrepreneurPulse.com.au  Fact Sheet for Healthcare Providers: IncredibleEmployment.be  This test is not yet approved or cleared by the [place] FDA and has been authorized for detection and/or diagnosis of SARS-CoV-2 by FDA under an Emergency Use Authorization (EUA). This EUA will remain in effect (meaning this test can be used) for the duration of the COVID-19 declaration under Section 564(b)(1) of the Act, 21 U.S.C. section 360bbb-3(b)(1), unless the authorization is terminated or  revoked.  Performed at [facility], [address] [place], [place] [address]   Blood culture (routine x 2)     Status: Abnormal   Collection Time: 06/22/21 11:22 PM   Specimen: BLOOD  Result Value Ref Range Status    Specimen Description   Final    BLOOD BOTTLES DRAWN AEROBIC AND ANAEROBIC Performed at [facility], [address], [place], [place] [address]    Special Requests   Final    Blood Culture adequate volume RIGHT ANTECUBITAL Performed at [facility], [place], [place] [address]    Culture  Setup Time   Final    GRAM POSITIVE COCCI IN BOTH AEROBIC AND ANAEROBIC BOTTLES CRITICAL RESULT CALLED TO, READ BACK BY AND VERIFIED WITH: PHARMD Nueces PHAM 06/23/21@23 :25 BY TW Performed at [facility], [place] [address] [place], [place] [address]    Culture ENTEROCOCCUS FAECALIS (A)  Final   Report Status 06/26/2021 FINAL  Final   Organism ID, Bacteria ENTEROCOCCUS FAECALIS  Final      Susceptibility   Enterococcus faecalis - MIC*    AMPICILLIN <=2 SENSITIVE Sensitive     VANCOMYCIN 1 SENSITIVE Sensitive     GENTAMICIN SYNERGY SENSITIVE Sensitive     * ENTEROCOCCUS FAECALIS  Blood Culture ID Panel (Reflexed)     Status: Abnormal   Collection Time: 06/22/21 11:22 PM  Result Value Ref Range Status   Enterococcus faecalis DETECTED (A) NOT DETECTED Final    Comment: CRITICAL RESULT CALLED TO, READ BACK BY AND VERIFIED WITH: PHARMD MINH PHAM 06/23/21@23 :25 BY TW    Enterococcus Faecium NOT DETECTED NOT DETECTED Final   Listeria monocytogenes NOT DETECTED NOT DETECTED Final   Staphylococcus species NOT DETECTED NOT DETECTED Final   Staphylococcus aureus (BCID) NOT DETECTED NOT DETECTED Final   Staphylococcus epidermidis NOT DETECTED NOT DETECTED Final   Staphylococcus lugdunensis NOT DETECTED NOT DETECTED Final   Streptococcus species NOT DETECTED NOT DETECTED Final   Streptococcus agalactiae NOT DETECTED NOT DETECTED Final   Streptococcus pneumoniae NOT DETECTED NOT DETECTED Final   Streptococcus pyogenes NOT DETECTED NOT DETECTED Final   A.calcoaceticus-baumannii NOT DETECTED NOT DETECTED Final   Bacteroides fragilis NOT DETECTED NOT  DETECTED Final   Enterobacterales NOT DETECTED NOT DETECTED Final   Enterobacter cloacae complex NOT DETECTED NOT DETECTED Final   Escherichia coli NOT DETECTED NOT DETECTED Final   Klebsiella aerogenes NOT DETECTED NOT DETECTED Final   Klebsiella oxytoca NOT DETECTED NOT DETECTED Final   Klebsiella pneumoniae NOT DETECTED NOT DETECTED Final   Proteus species NOT DETECTED NOT DETECTED Final   Salmonella species NOT DETECTED NOT DETECTED Final   Serratia marcescens NOT DETECTED NOT DETECTED Final   Haemophilus influenzae NOT DETECTED NOT DETECTED Final   Neisseria meningitidis NOT DETECTED NOT DETECTED Final   Pseudomonas aeruginosa NOT DETECTED NOT DETECTED Final   Stenotrophomonas maltophilia NOT DETECTED NOT DETECTED Final   Candida albicans NOT DETECTED NOT DETECTED Final   Candida auris NOT DETECTED NOT DETECTED Final   Candida glabrata NOT DETECTED NOT DETECTED Final   Candida krusei NOT DETECTED NOT DETECTED Final   Candida parapsilosis NOT DETECTED NOT DETECTED Final   Candida tropicalis NOT DETECTED NOT DETECTED Final   Cryptococcus neoformans/gattii NOT DETECTED NOT DETECTED Final   Vancomycin resistance NOT DETECTED NOT DETECTED Final    Comment: Performed at [facility], [place] [address], [place], [place] [address]  Blood culture (routine x 2)  Status: Abnormal   Collection Time: 06/22/21 11:35 PM   Specimen: BLOOD LEFT WRIST  Result Value Ref Range Status   Specimen Description BLOOD LEFT WRIST  Final   Special Requests   Final    BOTTLES DRAWN AEROBIC AND ANAEROBIC Blood Culture adequate volume   Culture  Setup Time   Final    GRAM POSITIVE COCCI IN BOTH AEROBIC AND ANAEROBIC BOTTLES CRITICAL VALUE NOTED.  VALUE IS CONSISTENT WITH PREVIOUSLY REPORTED AND CALLED VALUE.    Culture (A)  Final    ENTEROCOCCUS FAECALIS SUSCEPTIBILITIES PERFORMED ON PREVIOUS CULTURE WITHIN THE LAST 5 DAYS. Performed at [facility], [place] [address] [place], [place]  [address]    Report Status 06/26/2021 FINAL  Final  Aerobic/Anaerobic Culture w Gram Stain (surgical/deep wound)     Status: None (Preliminary result)   Collection Time: 06/23/21  2:52 PM   Specimen: Back; Tissue  Result Value Ref Range Status   Specimen Description ABSCESS  Final   Special Requests PARASPINAL L4 L5  Final   Gram Stain   Final    MODERATE WBC PRESENT,BOTH PMN AND MONONUCLEAR NO ORGANISMS SEEN Performed at [facility], [address] [place], [place] [address]    Culture   Final    RARE ENTEROCOCCUS FAECALIS NO ANAEROBES ISOLATED; CULTURE IN PROGRESS FOR 5 DAYS    Report Status PENDING  Incomplete   Organism ID, Bacteria ENTEROCOCCUS FAECALIS  Final      Susceptibility   Enterococcus faecalis - MIC*    AMPICILLIN <=2 SENSITIVE Sensitive     VANCOMYCIN 1 SENSITIVE Sensitive     GENTAMICIN SYNERGY SENSITIVE Sensitive     * RARE ENTEROCOCCUS FAECALIS  Culture, blood (routine x 2)     Status: None (Preliminary result)   Collection Time: 06/24/21  3:44 PM   Specimen: BLOOD  Result Value Ref Range Status   Specimen Description BLOOD LEFT ANTECUBITAL  Final   Special Requests   Final    BOTTLES DRAWN AEROBIC AND ANAEROBIC Blood Culture adequate volume   Culture   Final    NO GROWTH 3 DAYS Performed at [facility], [address] [place], [place] [address]    Report Status PENDING  Incomplete  Culture, blood (routine x 2)     Status: None (Preliminary result)   Collection Time: 06/24/21  4:00 PM   Specimen: BLOOD LEFT HAND  Result Value Ref Range Status   Specimen Description BLOOD LEFT HAND  Final   Special Requests   Final    BOTTLES DRAWN AEROBIC ONLY Blood Culture adequate volume   Culture   Final    NO GROWTH 3 DAYS Performed at [facility], [place] [address], [place], [place] [address]    Report Status PENDING  Incomplete    Impression/Plan:  1. L2-L5 inflammatory changes and septic arthritis - blood cultures and aspirate cultures  with Enteroccocus and on ampicillin. She has a history of L2/3 expoloration and L1/2 discetomy and fusion with pedicle screw fixation and posterolateral arthrodesis.   Continue with ampicillin for a prolonged course.    2.  Bacteremia - positive as above with Enterococcus.  Repeat blood cultures ngtd.   TEE planned for tomorrow, appreciate cardiology assistance.  Treatment of above depending on TEE results.  Will need to continue with ceftriaxone if endocarditis is present.   3.  COVID positive - incidental.  On plaxlovid and no symptoms today.

## 2021-06-27 NOTE — Plan of Care (Signed)

## 2021-06-28 ENCOUNTER — Inpatient Hospital Stay (HOSPITAL_COMMUNITY): Payer: 59 | Admitting: Anesthesiology

## 2021-06-28 ENCOUNTER — Encounter (HOSPITAL_COMMUNITY): Payer: Self-pay | Admitting: Internal Medicine

## 2021-06-28 ENCOUNTER — Inpatient Hospital Stay (HOSPITAL_COMMUNITY): Payer: 59

## 2021-06-28 ENCOUNTER — Encounter (HOSPITAL_COMMUNITY)
Admission: EM | Disposition: A | Discharge status: Home / Self Care | Payer: Self-pay | Source: Home / Self Care | Attending: Family Medicine

## 2021-06-28 DIAGNOSIS — I358 Other nonrheumatic aortic valve disorders: Secondary | ICD-10-CM

## 2021-06-28 DIAGNOSIS — R6520 Severe sepsis without septic shock: Secondary | ICD-10-CM | POA: Diagnosis not present

## 2021-06-28 DIAGNOSIS — Q231 Congenital insufficiency of aortic valve: Secondary | ICD-10-CM | POA: Diagnosis not present

## 2021-06-28 DIAGNOSIS — R7881 Bacteremia: Secondary | ICD-10-CM

## 2021-06-28 DIAGNOSIS — I351 Nonrheumatic aortic (valve) insufficiency: Secondary | ICD-10-CM | POA: Diagnosis not present

## 2021-06-28 DIAGNOSIS — A419 Sepsis, unspecified organism: Secondary | ICD-10-CM | POA: Diagnosis not present

## 2021-06-28 DIAGNOSIS — U071 COVID-19: Secondary | ICD-10-CM | POA: Diagnosis not present

## 2021-06-28 HISTORY — PX: TEE WITHOUT CARDIOVERSION: SHX5443

## 2021-06-28 LAB — COMPREHENSIVE METABOLIC PANEL
ALT: 24 U/L (ref 0–44)
AST: 16 U/L (ref 15–41)
Albumin: 2.2 g/dL — ABNORMAL LOW (ref 3.5–5.0)
Alkaline Phosphatase: 85 U/L (ref 38–126)
Anion gap: 7 (ref 5–15)
BUN: 7 mg/dL (ref 6–20)
CO2: 26 mmol/L (ref 22–32)
Calcium: 8.3 mg/dL — ABNORMAL LOW (ref 8.9–10.3)
Chloride: 103 mmol/L (ref 98–111)
Creatinine, Ser: 0.69 mg/dL (ref 0.44–1.00)
GFR, Estimated: >60 mL/min (ref >60)
Glucose, Bld: 95 mg/dL (ref 70–99)
Potassium: 3.9 mmol/L (ref 3.5–5.1)
Sodium: 136 mmol/L (ref 135–145)
Total Bilirubin: 0.3 mg/dL (ref 0.3–1.2)
Total Protein: 6.4 g/dL — ABNORMAL LOW (ref 6.5–8.1)

## 2021-06-28 LAB — CBC WITH DIFFERENTIAL/PLATELET
Abs Immature Granulocytes: 0.03 10*3/uL (ref 0.00–0.07)
Basophils Absolute: 0 10*3/uL (ref 0.0–0.1)
Basophils Relative: 0 %
Eosinophils Absolute: 0.2 10*3/uL (ref 0.0–0.5)
Eosinophils Relative: 2 %
HCT: 34.4 % — ABNORMAL LOW (ref 36.0–46.0)
Hemoglobin: 11 g/dL — ABNORMAL LOW (ref 12.0–15.0)
Immature Granulocytes: 0 %
Lymphocytes Relative: 32 %
Lymphs Abs: 2.9 10*3/uL (ref 0.7–4.0)
MCH: 27.1 pg (ref 26.0–34.0)
MCHC: 32 g/dL (ref 30.0–36.0)
MCV: 84.7 fL (ref 80.0–100.0)
Monocytes Absolute: 0.7 10*3/uL (ref 0.1–1.0)
Monocytes Relative: 8 %
Neutro Abs: 5.1 10*3/uL (ref 1.7–7.7)
Neutrophils Relative %: 58 %
Platelets: 371 10*3/uL (ref 150–400)
RBC: 4.06 MIL/uL (ref 3.87–5.11)
RDW: 13.5 % (ref 11.5–15.5)
WBC: 8.9 10*3/uL (ref 4.0–10.5)
nRBC: 0 % (ref 0.0–0.2)

## 2021-06-28 LAB — AEROBIC/ANAEROBIC CULTURE W GRAM STAIN (SURGICAL/DEEP WOUND)

## 2021-06-28 LAB — MAGNESIUM: Magnesium: 1.9 mg/dL (ref 1.7–2.4)

## 2021-06-28 LAB — PHOSPHORUS: Phosphorus: 3.7 mg/dL (ref 2.5–4.6)

## 2021-06-28 SURGERY — ECHOCARDIOGRAM, TRANSESOPHAGEAL
Anesthesia: Monitor Anesthesia Care

## 2021-06-28 MED ORDER — HYDROCODONE-ACETAMINOPHEN 5-325 MG PO TABS
1.0000 | ORAL_TABLET | ORAL | Status: DC | PRN
  Filled 2021-06-28: qty 1

## 2021-06-28 MED ORDER — DICLOFENAC SODIUM 1 % EX GEL
2.0000 g | Freq: Four times a day (QID) | CUTANEOUS | Status: DC | PRN
  Filled 2021-06-28: qty 100

## 2021-06-28 MED ORDER — LIDOCAINE HCL (CARDIAC) PF 100 MG/5ML IV SOSY
PREFILLED_SYRINGE | INTRAVENOUS | Status: DC | PRN
  Administered 2021-06-28: 60 mg via INTRAVENOUS

## 2021-06-28 MED ORDER — LIDOCAINE 5 % EX PTCH
1.0000 | MEDICATED_PATCH | CUTANEOUS | Status: DC
  Administered 2021-06-28 – 2021-06-30 (×2): 1 via TRANSDERMAL
  Filled 2021-06-28 (×4): qty 1

## 2021-06-28 MED ORDER — PROPOFOL 10 MG/ML IV BOLUS
INTRAVENOUS | Status: DC | PRN
  Administered 2021-06-28: 10 mg via INTRAVENOUS
  Administered 2021-06-28 (×2): 20 mg via INTRAVENOUS

## 2021-06-28 MED ORDER — PROPOFOL 500 MG/50ML IV EMUL
INTRAVENOUS | Status: DC | PRN
  Administered 2021-06-28: 125 ug/kg/min via INTRAVENOUS

## 2021-06-28 MED ORDER — HYDROCODONE-ACETAMINOPHEN 10-325 MG PO TABS
1.0000 | ORAL_TABLET | ORAL | Status: DC | PRN
  Administered 2021-06-28 – 2021-07-01 (×6): 1 via ORAL
  Filled 2021-06-28 (×6): qty 1

## 2021-06-28 NOTE — Progress Notes (Signed)
Physical Therapy Treatment Patient Details Name: Mary Chambers MRN: [id number] DOB: 1971-08-28 Today's Date: 06/28/2021   History of Present Illness Mary Chambers is a 50 y.o. female who presents with complaints of back pain. Subsequently developed worsening symptoms, radiating pain down her R leg.  Fevers.  MRI was concerning for septic arthritis of the spine with possible abscess. s/p IR aspiration of right posterior paraspinal abscess, culture with rare enterococcus faecalis; incidental covid positive finding as well;  with medical history significant of hypertension, nephrolithiasis, morbid obesity, and prior back surgery back in 2020    PT Comments    Continuing work on functional mobility and activity tolerance;  Session focused on getting up and moving as a modality to help with with her back pain and soreness; Bale to get up to EOB without physical assist and walk in room with RW; Assisted pt with hygeine, and washing up before her TEE;   Considering way to incr pt's independence here acutely; Discussed with her nurse, Arline Asp, the possibility of have a scheduled time when her IV can be saline locked to allow her more freedom of movement in her room;   At this point, she doesn't need HHPT follow up -- but I put in an OT consult per protocol to help address her independence with ADLs    Recommendations for follow up therapy are one component of a multi-disciplinary discharge planning process, led by the attending physician.  Recommendations may be updated based on patient status, additional functional criteria and insurance authorization.  Follow Up Recommendations  No PT follow up     Equipment Recommendations  Rolling walker with 5" wheels;3in1 (PT);Other (comment) (shower seat)    Recommendations for Other Services OT consult (will order per protocol)     Precautions / Restrictions Precautions Precautions: Back Precaution Comments: Back precautions  for comfort     Mobility  Bed Mobility Overal bed mobility: Needs Assistance Bed Mobility: Rolling;Sidelying to Sit Rolling: Min guard Sidelying to sit: Min guard       General bed mobility comments: Smooth motion without much difficulty; minguard for safety    Transfers Overall transfer level: Needs assistance Equipment used: Rolling walker (2 wheeled) Transfers: Sit to/from Stand Sit to Stand: Min guard         General transfer comment: Minguard for safety; stood from bed and toilet  Ambulation/Gait Ambulation/Gait assistance: Min guard Gait Distance (Feet): 20 Feet Assistive device: Rolling walker (2 wheeled) Gait Pattern/deviations: Step-through pattern     General Gait Details: Trunk slightly flexed   Stairs             Wheelchair Mobility    Modified Rankin (Stroke Patients Only)       Balance     Sitting balance-Leahy Scale: Fair (approaching good)       Standing balance-Leahy Scale: Poor (approaching fair)                              Cognition Arousal/Alertness: Awake/alert Behavior During Therapy: WFL for tasks assessed/performed Overall Cognitive Status: Within Functional Limits for tasks assessed                                        Exercises      General Comments General comments (skin integrity, edema, etc.): Took time to listen to pt's concerns  about her care      Pertinent Vitals/Pain Pain Assessment: 0-10 Pain Score: 5  Pain Location: back and R leg Pain Descriptors / Indicators: Aching Pain Intervention(s): Monitored during session    Home Living                      Prior Function            PT Goals (current goals can now be found in the care plan section) Acute Rehab PT Goals Patient Stated Goal: be able to return to work PT Goal Formulation: With patient Time For Goal Achievement: 07/10/21 Potential to Achieve Goals: Good Progress towards PT goals: Progressing  toward goals    Frequency    Min 3X/week      PT Plan Discharge plan needs to be updated    Co-evaluation              AM-PAC PT "6 Clicks" Mobility   Outcome Measure  Help needed turning from your back to your side while in a flat bed without using bedrails?: None Help needed moving from lying on your back to sitting on the side of a flat bed without using bedrails?: None Help needed moving to and from a bed to a chair (including a wheelchair)?: None Help needed standing up from a chair using your arms (e.g., wheelchair or bedside chair)?: A Little Help needed to walk in hospital room?: A Little Help needed climbing 3-5 steps with a railing? : A Little 6 Click Score: 21    End of Session   Activity Tolerance: Patient tolerated treatment well Patient left: in chair;with call bell/phone within reach;Other (comment) (Dr. Florene Glen in to see pt) Nurse Communication: Mobility status PT Visit Diagnosis: Other abnormalities of gait and mobility (R26.89)     Time: 6269-4854 PT Time Calculation (min) (ACUTE ONLY): 38 min  Charges:  $Gait Training: 8-22 mins $Therapeutic Activity: 23-37 mins                     Roney Marion, PT  Acute Rehabilitation Services Pager [phone] Office [phone]    Mary Chambers 06/28/2021, 2:01 PM

## 2021-06-28 NOTE — Progress Notes (Signed)
  Echocardiogram Echocardiogram Transesophageal has been performed.  Mary Chambers F 06/28/2021, 3:05 PM

## 2021-06-28 NOTE — Anesthesia Procedure Notes (Signed)
Procedure Name: MAC Date/Time: 06/28/2021 1:56 PM Performed by: Lieutenant Diego, CRNA Pre-anesthesia Checklist: Patient identified, Emergency Drugs available, Patient being monitored, Suction available and Timeout performed Patient Re-evaluated:Patient Re-evaluated prior to induction Oxygen Delivery Method: Simple face mask Preoxygenation: Pre-oxygenation with 100% oxygen Induction Type: IV induction

## 2021-06-28 NOTE — Interval H&P Note (Signed)
History and Physical Interval Note:  06/28/2021 1:34 PM  Mary Chambers  has presented today for surgery, with the diagnosis of BACTEREMIA.  The various methods of treatment have been discussed with the patient and family. After consideration of risks, benefits and other options for treatment, the patient has consented to  Procedure(s): TRANSESOPHAGEAL ECHOCARDIOGRAM (TEE) (N/A) as a surgical intervention.  The patient's history has been reviewed, patient examined, no change in status, stable for surgery.  I have reviewed the patient's chart and labs.  Questions were answered to the patient's satisfaction.     Kirk Ruths

## 2021-06-28 NOTE — Progress Notes (Addendum)
PROGRESS NOTE    Mary Chambers  QBV:[id number] DOB: [birth year] DOA: 06/22/2021 PCP: Mary Beals, NP   Chief Complaint  Patient presents with   Back Pain   Fever   Emesis    Brief Narrative:  Mary Chambers is Mary Chambers 50 y.o. female with medical history significant of hypertension, nephrolithiasis, morbid obesity, and prior back surgery back in 2020 who presents with complaints of back pain.  She was seen in urgent care and had CT abdomen pelvis without acute abnormalities and was discharged home on keflex.  Subsequently developed worsening symptoms, radiating pain down her R leg.  Fevers.  MRI was concerning for septic arthritis of the spine with possible abscess.    She's been admitted for treatment of her septic arthritis of lumbar spine complicated by abscess and  enterococcus bacteremia.  S/p IR drainage.  ID and neurosurgery following.  TEE now shows AV endocarditis.     Assessment & Plan:   Principal Problem:   Sepsis ([place]) Active Problems:   Hypertension, benign   Morbidly obese (HCC)   Septic arthritis (HCC)   Transaminitis   Abscess   COVID-19 virus infection   Suspected UTI   Vertebral osteomyelitis ([place])  Enterococcus Faecalis Aortic Valve Endocarditis  Enterococcus Faecalis Bacteremia  Septic Arthritis of Lumbar Spine  Paraspinal Abscess  Sepsis:  Patient presents with complaints of back pain with radiation down her right leg and fever noted up to 101.2 F with heart rates elevated up to 119 meeting SIRS criteria.  Labs significant for ESR 98 and WBC 11.2.  - MRI T/L spine notable for findings concerning for septic arthritis and abscess, diffuse epidural enhancement about the lower thecal sac at the levels of L4-5 and L5-S1 without frank epidural abscess - blood cx with enterococcus faecalis - s/p IR aspiration of right posterior paraspinal abscess - culture with rare enterococcus faecalis, no anaerobes isolated - ID c/s, appreciate  recs  - repeat blood cx 9/23 NG  - echo EF 55-60%, can't rule out vegetation (see report)  - Tee with bicuspid AV, probable vegetation, severe AI  - ampicillin, ceftriaxone - NSGY c/s, appreciate recs - Cardiology c/s, appreciate recs   Bilateral LE edema - follow echo -> EF 55-60%, cannot r/o vegetation AV not well seen - resume lasix  COVID-19 infection: Acute.  Patient reports that she has had [name] nonproductive cough for few days now.  Initially thought symptoms were secondary to sinuses.  Noted to be incidentally noted to be positive for COVID-19. -symptoms mild, discussed use of antiviral like paxlovid, she's interested and agreeable (discussed with pharmacy) -hold lipitor while treating -needs 10 day isolation (can be d/c'd on 10/2)   Suspect urinary tract infection: Patient reported having urinary urgency with intermittent incontinence and not able to make it to the bathroom in time.  Urinalysis was significant for large leukocytes, rare bacteria, and 21-50 WBCs. -Follow-up urine culture (none sent, will follow up) -Patient on empiric antibiotics as seen above   Transaminitis: Acute.  Labs from 9/18 noted alkaline phosphatase 215, AST 88, and ALT 224.   -Check LFTs and hepatitis panel (negative) - improving, follow    Essential hypertension: Blood pressures are initially elevated at 161/90.  Home blood pressure medications include enalapril 10 mg daily, furosemide 20 mg daily, and hydrochlorothiazide 25 mg daily. -resuming lasix - plan to d/c HCTZ long term -Continue enalapril   Back pain with radicular: Acute on chronic.  Patient with prior history of lumbar  fusion back in 2020.  Home medications included tramadol. -Pain medications as seen above   Hyperlipidemia -holding lipitor while on paxlovid   Morbid obesity: BMI 41.66 kg per metered square.  Patient had been on phentermine in the outpatient setting. -Consider resuming phentermine in the outpatient setting  Nocturnal  Hypoxia - likely needs outpatient sleep study  Insomnia - will try trazodone  DVT prophylaxis: lovenox Code Status:full Family Communication: none at bedside Disposition:   Status is: Inpatient  Remains inpatient appropriate because:Inpatient level of care appropriate due to severity of illness  Dispo: The patient is from: Home              Anticipated d/c is to: Home              Patient currently is not medically stable to d/c.   Difficult to place patient No       Consultants:  IR NSGY cardiology  Procedures:  IMPRESSION: Successful and uncomplicated fluoroscopy guided fine-needle aspiration of [name] right posterior paraspinal abscess at the L4-5 level.  Echo IMPRESSIONS     1. Poor quality study bad acoustic windows limited.   2. Left ventricular ejection fraction, by estimation, is 55 to 60%. The  left ventricle has normal function. Left ventricular diastolic parameters  are indeterminate.   3. The mitral valve was not well visualized. No evidence of mitral valve  regurgitation.   4. AV not well seen Some calcification and ? mild to moderate AR Cannot  r/o vegetation Suggest TEE if clinically indicated . The aortic valve was  not well visualized.   5. The inferior vena cava is normal in size with greater than 50%  respiratory variability, suggesting right atrial pressure of 3 mmHg.   TEE IMPRESSIONS     1. Bicuspid aortic valve (left and right cusps appear to be fused);  probable vegetation noted; severe AI.   2. Left ventricular ejection fraction, by estimation, is 60 to 65%. The  left ventricle has normal function. The left ventricle has no regional  wall motion abnormalities.   3. Right ventricular systolic function is normal. The right ventricular  size is normal.   4. No left atrial/left atrial appendage thrombus was detected.   5. The mitral valve is normal in structure. No evidence of mitral valve  regurgitation.   6. The aortic valve is  bicuspid. Aortic valve regurgitation is severe.  Antimicrobials:  Anti-infectives (From admission, onward)    Start     Dose/Rate Route Frequency Ordered Stop   06/25/21 1415  cefTRIAXone (ROCEPHIN) 2 g in sodium chloride 0.9 % 100 mL IVPB        2 g 200 mL/hr over 30 Minutes Intravenous Every 12 hours 06/25/21 1324     06/24/21 2200  nirmatrelvir/ritonavir EUA (PAXLOVID) 3 tablet        3 tablet Oral 2 times daily 06/24/21 1622 06/29/21 2159   06/24/21 0030  ampicillin (OMNIPEN) 2 g in sodium chloride 0.9 % 100 mL IVPB        2 g 300 mL/hr over 20 Minutes Intravenous Every 4 hours 06/23/21 2342     06/24/21 0000  cefTRIAXone (ROCEPHIN) 2 g in sodium chloride 0.9 % 100 mL IVPB  Status:  Discontinued        2 g 200 mL/hr over 30 Minutes Intravenous Every 24 hours 06/23/21 0753 06/23/21 2342   06/23/21 1930  metroNIDAZOLE (FLAGYL) IVPB 500 mg  Status:  Discontinued  500 mg 100 mL/hr over 60 Minutes Intravenous Every 12 hours 06/23/21 0753 06/23/21 2342   06/23/21 1000  vancomycin (VANCOCIN) IVPB 1000 mg/200 mL premix  Status:  Discontinued        1,000 mg 200 mL/hr over 60 Minutes Intravenous Every 12 hours 06/23/21 0954 06/23/21 2342   06/22/21 2330  vancomycin (VANCOCIN) IVPB 1000 mg/200 mL premix        1,000 mg 200 mL/hr over 60 Minutes Intravenous  Once 06/22/21 2319 06/23/21 0731   06/22/21 2330  metroNIDAZOLE (FLAGYL) IVPB 500 mg        500 mg 100 mL/hr over 60 Minutes Intravenous  Once 06/22/21 2319 06/23/21 0838   06/22/21 2330  cefTRIAXone (ROCEPHIN) 1 g in sodium chloride 0.9 % 100 mL IVPB        1 g 200 mL/hr over 30 Minutes Intravenous  Once 06/22/21 2319 06/23/21 0109          Subjective: Continued back pain No other complaints  Objective: Vitals:   06/28/21 1417 06/28/21 1426 06/28/21 1435 06/28/21 1500  BP: 105/62 111/72 124/64 120/67  Pulse: 89 91 92 87  Resp: 17 16 17 17   Temp: 98.5 F (36.9 C)   98.2 F (36.8 C)  TempSrc: Oral   Oral  SpO2:  96% 97% 99% 100%  Weight:      Height:        Intake/Output Summary (Last 24 hours) at 06/28/2021 1818 Last data filed at 06/28/2021 1531 Gross per 24 hour  Intake 100 ml  Output 1950 ml  Net -1850 ml   Filed Weights   06/22/21 1940  Weight: 120.7 kg    Examination:  General: No acute distress. Cardiovascular: RRR Lungs:  unlabored Abdomen: Soft, nontender, nondistended Neurological: Alert and oriented 3. Moves all extremities 4 . Cranial nerves II through XII grossly intact. Skin: Warm and dry. No rashes or lesions. Extremities: No clubbing or cyanosis. No edema.  Data Reviewed: I have personally reviewed following labs and imaging studies  CBC: Recent Labs  Lab 06/22/21 2257 06/24/21 1543 06/26/21 0110 06/27/21 0831 06/28/21 0347  WBC 11.2* 7.5 8.0 7.2 8.9  NEUTROABS 7.2 4.6 4.2 4.2 5.1  HGB 12.3 10.6* 10.2* 11.4* 11.0*  HCT 38.6 33.5* 32.4* 36.3 34.4*  MCV 83.7 85.9 85.7 85.6 84.7  PLT 243 263 311 312 563    Basic Metabolic Panel: Recent Labs  Lab 06/22/21 2257 06/24/21 1543 06/25/21 0359 06/26/21 0110 06/27/21 0030 06/28/21 0347  NA 139 138  --  139 140 136  K 3.7 3.5  --  3.5 3.4* 3.9  CL 100 104  --  103 100 103  CO2 26 26  --  28 29 26   GLUCOSE 96 131*  --  119* 87 95  BUN 16 7  --  7 8 7   CREATININE 0.89 0.77  --  0.87 0.92 0.69  CALCIUM 9.6 8.3*  --  8.3* 8.9 8.3*  MG  --   --  1.8 1.6* 2.0 1.9  PHOS  --   --  3.2 3.0 3.2 3.7    GFR: Estimated Creatinine Clearance: 113.2 mL/min (by C-G formula based on SCr of 0.69 mg/dL).  Liver Function Tests: Recent Labs  Lab 06/23/21 1000 06/24/21 1543 06/26/21 0110 06/27/21 0030 06/28/21 0347  AST 21 28 20 22 16   ALT 50* 40 32 33 24  ALKPHOS 128* 105 92 105 85  BILITOT 0.7 0.4 0.4 0.5 0.3  PROT 6.6 6.4* 6.4* 8.3*  6.4*  ALBUMIN 2.5* 2.2* 2.2* 2.7* 2.2*    CBG: No results for input(s): GLUCAP in the last 168 hours.   Recent Results (from the past 240 hour(s))  Resp Panel by RT-PCR  (Flu Reed Dady&B, Covid) Nasopharyngeal Swab     Status: Abnormal   Collection Time: 06/22/21 10:57 PM   Specimen: Nasopharyngeal Swab; Nasopharyngeal(NP) swabs in vial transport medium  Result Value Ref Range Status   SARS Coronavirus 2 by RT PCR POSITIVE (Darrik Richman) NEGATIVE Final    Comment: RESULT CALLED TO, READ BACK BY AND VERIFIED WITH: Burgess Amor RN AT 0006 06/23/21 [place] (NOTE) SARS-CoV-2 target nucleic acids are DETECTED.  The SARS-CoV-2 RNA is generally detectable in upper respiratory specimens during the acute phase of infection. Positive results are indicative of the presence of the identified virus, but do not rule out bacterial infection or co-infection with other pathogens not detected by the test. Clinical correlation with patient history and other diagnostic information is necessary to determine patient infection status. The expected result is Negative.  Fact Sheet for Patients: EntrepreneurPulse.com.au  Fact Sheet for Healthcare Providers: IncredibleEmployment.be  This test is not yet approved or cleared by the [place] FDA and  has been authorized for detection and/or diagnosis of SARS-CoV-2 by FDA under an Emergency Use Authorization (EUA).  This EUA will remain in effect (meaning this test can be  used) for the duration of  the COVID-19 declaration under Section 564(b)(1) of the Act, 21 U.S.C. section 360bbb-3(b)(1), unless the authorization is terminated or revoked sooner.     Influenza Milik Gilreath by PCR NEGATIVE NEGATIVE Final   Influenza B by PCR NEGATIVE NEGATIVE Final    Comment: (NOTE) The Xpert Xpress SARS-CoV-2/FLU/RSV plus assay is intended as an aid in the diagnosis of influenza from Nasopharyngeal swab specimens and should not be used as Bre [place] sole basis for treatment. Nasal washings and aspirates are unacceptable for Xpert Xpress SARS-CoV-2/FLU/RSV testing.  Fact Sheet for Patients: EntrepreneurPulse.com.au  Fact  Sheet for Healthcare Providers: IncredibleEmployment.be  This test is not yet approved or cleared by the [place] FDA and has been authorized for detection and/or diagnosis of SARS-CoV-2 by FDA under an Emergency Use Authorization (EUA). This EUA will remain in effect (meaning this test can be used) for the duration of the COVID-19 declaration under Section 564(b)(1) of the Act, 21 U.S.C. section 360bbb-3(b)(1), unless the authorization is terminated or revoked.  Performed at [facility], [address] [place], [place] [address]   Blood culture (routine x 2)     Status: Abnormal   Collection Time: 06/22/21 11:22 PM   Specimen: BLOOD  Result Value Ref Range Status   Specimen Description   Final    BLOOD BOTTLES DRAWN AEROBIC AND ANAEROBIC Performed at [facility], [address], [place], [place] [address]    Special Requests   Final    Blood Culture adequate volume RIGHT ANTECUBITAL Performed at [facility], [place], [place] [address]    Culture  Setup Time   Final    GRAM POSITIVE COCCI IN BOTH AEROBIC AND ANAEROBIC BOTTLES CRITICAL RESULT CALLED TO, READ BACK BY AND VERIFIED WITH: PHARMD [place] PHAM 06/23/21@23 :25 BY TW Performed at [facility], [place] [address] [place], [place] [address]    Culture ENTEROCOCCUS FAECALIS (Eesa Justiss)  Final   Report Status 06/26/2021 FINAL  Final   Organism ID, Bacteria ENTEROCOCCUS FAECALIS  Final      Susceptibility   Enterococcus faecalis -  MIC*    AMPICILLIN <=2 SENSITIVE Sensitive     VANCOMYCIN 1 SENSITIVE Sensitive     GENTAMICIN SYNERGY SENSITIVE Sensitive     * ENTEROCOCCUS FAECALIS  Blood Culture ID Panel (Reflexed)     Status: Abnormal   Collection Time: 06/22/21 11:22 PM  Result Value Ref Range Status   Enterococcus faecalis DETECTED (Caci Orren) NOT DETECTED Final    Comment: CRITICAL RESULT CALLED TO, READ BACK BY AND VERIFIED  WITH: PHARMD [place] PHAM 06/23/21@23 :25 BY TW    Enterococcus Faecium NOT DETECTED NOT DETECTED Final   Listeria monocytogenes NOT DETECTED NOT DETECTED Final   Staphylococcus species NOT DETECTED NOT DETECTED Final   Staphylococcus aureus (BCID) NOT DETECTED NOT DETECTED Final   Staphylococcus epidermidis NOT DETECTED NOT DETECTED Final   Staphylococcus lugdunensis NOT DETECTED NOT DETECTED Final   Streptococcus species NOT DETECTED NOT DETECTED Final   Streptococcus agalactiae NOT DETECTED NOT DETECTED Final   Streptococcus pneumoniae NOT DETECTED NOT DETECTED Final   Streptococcus pyogenes NOT DETECTED NOT DETECTED Final   Cyndel Griffey.calcoaceticus-baumannii NOT DETECTED NOT DETECTED Final   Bacteroides fragilis NOT DETECTED NOT DETECTED Final   Enterobacterales NOT DETECTED NOT DETECTED Final   Enterobacter cloacae complex NOT DETECTED NOT DETECTED Final   Escherichia coli NOT DETECTED NOT DETECTED Final   Klebsiella aerogenes NOT DETECTED NOT DETECTED Final   Klebsiella oxytoca NOT DETECTED NOT DETECTED Final   Klebsiella pneumoniae NOT DETECTED NOT DETECTED Final   Proteus species NOT DETECTED NOT DETECTED Final   Salmonella species NOT DETECTED NOT DETECTED Final   Serratia marcescens NOT DETECTED NOT DETECTED Final   Haemophilus influenzae NOT DETECTED NOT DETECTED Final   Neisseria meningitidis NOT DETECTED NOT DETECTED Final   Pseudomonas aeruginosa NOT DETECTED NOT DETECTED Final   Stenotrophomonas maltophilia NOT DETECTED NOT DETECTED Final   Candida albicans NOT DETECTED NOT DETECTED Final   Candida auris NOT DETECTED NOT DETECTED Final   Candida glabrata NOT DETECTED NOT DETECTED Final   Candida krusei NOT DETECTED NOT DETECTED Final   Candida parapsilosis NOT DETECTED NOT DETECTED Final   Candida tropicalis NOT DETECTED NOT DETECTED Final   Cryptococcus neoformans/gattii NOT DETECTED NOT DETECTED Final   Vancomycin resistance NOT DETECTED NOT DETECTED Final    Comment: Performed  at [facility], [address], [place], [place] [address]  Blood culture (routine x 2)     Status: Abnormal   Collection Time: 06/22/21 11:35 PM   Specimen: BLOOD LEFT WRIST  Result Value Ref Range Status   Specimen Description BLOOD LEFT WRIST  Final   Special Requests   Final    BOTTLES DRAWN AEROBIC AND ANAEROBIC Blood Culture adequate volume   Culture  Setup Time   Final    GRAM POSITIVE COCCI IN BOTH AEROBIC AND ANAEROBIC BOTTLES CRITICAL VALUE NOTED.  VALUE IS CONSISTENT WITH PREVIOUSLY REPORTED AND CALLED VALUE.    Culture (Aalliyah Kilker)  Final    ENTEROCOCCUS FAECALIS SUSCEPTIBILITIES PERFORMED ON PREVIOUS CULTURE WITHIN THE LAST 5 DAYS. Performed at [facility], [place] [address] [place], [place] [address]    Report Status 06/26/2021 FINAL  Final  Aerobic/Anaerobic Culture w Gram Stain (surgical/deep wound)     Status: None   Collection Time: 06/23/21  2:52 PM   Specimen: Back; Tissue  Result Value Ref Range Status   Specimen Description ABSCESS  Final   Special Requests PARASPINAL L4 L5  Final   Gram Stain   Final    MODERATE WBC  PRESENT,BOTH PMN AND MONONUCLEAR NO ORGANISMS SEEN    Culture   Final    RARE ENTEROCOCCUS FAECALIS NO ANAEROBES ISOLATED Performed at [facility], [place] [address] [place], [place] [address]    Report Status 06/28/2021 FINAL  Final   Organism ID, Bacteria ENTEROCOCCUS FAECALIS  Final      Susceptibility   Enterococcus faecalis - MIC*    AMPICILLIN <=2 SENSITIVE Sensitive     VANCOMYCIN 1 SENSITIVE Sensitive     GENTAMICIN SYNERGY SENSITIVE Sensitive     * RARE ENTEROCOCCUS FAECALIS  Culture, blood (routine x 2)     Status: None (Preliminary result)   Collection Time: 06/24/21  3:44 PM   Specimen: BLOOD  Result Value Ref Range Status   Specimen Description BLOOD LEFT ANTECUBITAL  Final   Special Requests   Final    BOTTLES DRAWN AEROBIC AND ANAEROBIC Blood Culture adequate volume   Culture   Final    NO GROWTH 4  DAYS Performed at [facility], [address] [place], [place] [address]    Report Status PENDING  Incomplete  Culture, blood (routine x 2)     Status: None (Preliminary result)   Collection Time: 06/24/21  4:00 PM   Specimen: BLOOD LEFT HAND  Result Value Ref Range Status   Specimen Description BLOOD LEFT HAND  Final   Special Requests   Final    BOTTLES DRAWN AEROBIC ONLY Blood Culture adequate volume   Culture   Final    NO GROWTH 4 DAYS Performed at [facility], [place] [address], [place], [place] [address]    Report Status PENDING  Incomplete         Radiology Studies: ECHO TEE  Result Date: 06/28/2021    TRANSESOPHOGEAL ECHO REPORT   Patient Name:   TENLEE WOLLIN Date of Exam: 06/28/2021 Medical Rec #:  [id number]                 Height:       67.0 in Accession #:    [id number]                Weight:       266.0 lb Date of Birth:  Oct 27, 1970                 BSA:          2.283 m Patient Age:    73 years                  BP:           120/67 mmHg Patient Gender: F                         HR:           104 bpm. Exam Location:  Inpatient Procedure: Cardiac Doppler, Color Doppler and Transesophageal Echo Indications:     Bacteremia  History:         Patient has prior history of Echocardiogram examinations, most                  recent 06/26/2021.  Sonographer:     Merrie Roof RDCS Referring Phys:  [id number] Abigail Butts Diagnosing Phys: Kirk Ruths MD PROCEDURE: After discussion of the risks and benefits of Theophilus Walz TEE, an informed consent was obtained from the patient. The transesophogeal probe was passed without difficulty through the esophogus of the  patient. Local oropharyngeal anesthetic was provided with Cetacaine. Sedation performed by different physician. The patient was monitored while under deep sedation. The patient's vital signs; including heart rate, blood pressure, and oxygen saturation; remained stable throughout the procedure. The patient developed no  complications during the procedure. IMPRESSIONS  1. Bicuspid aortic valve (left and right cusps appear to be fused); probable vegetation noted; severe AI.  2. Left ventricular ejection fraction, by estimation, is 60 to 65%. The left ventricle has normal function. The left ventricle has no regional wall motion abnormalities.  3. Right ventricular systolic function is normal. The right ventricular size is normal.  4. No left atrial/left atrial appendage thrombus was detected.  5. The mitral valve is normal in structure. No evidence of mitral valve regurgitation.  6. The aortic valve is bicuspid. Aortic valve regurgitation is severe. FINDINGS  Left Ventricle: Left ventricular ejection fraction, by estimation, is 60 to 65%. The left ventricle has normal function. The left ventricle has no regional wall motion abnormalities. The left ventricular internal cavity size was normal in size. Right Ventricle: The right ventricular size is normal. Right ventricular systolic function is normal. Left Atrium: Left atrial size was normal in size. No left atrial/left atrial appendage thrombus was detected. Right Atrium: Right atrial size was normal in size. Pericardium: There is no evidence of pericardial effusion. Mitral Valve: The mitral valve is normal in structure. No evidence of mitral valve regurgitation. Tricuspid Valve: The tricuspid valve is normal in structure. Tricuspid valve regurgitation is trivial. Aortic Valve: The aortic valve is bicuspid. Aortic valve regurgitation is severe. Pulmonic Valve: The pulmonic valve was normal in structure. Pulmonic valve regurgitation is trivial. Aorta: The aortic root is normal in size and structure. There is minimal (Grade I) plaque involving the descending aorta. IAS/Shunts: No atrial level shunt detected by color flow Doppler. Additional Comments: Bicuspid aortic valve (left and right cusps appear to be fused); probable vegetation noted; severe AI. Kirk Ruths MD Electronically  signed by Kirk Ruths MD Signature Date/Time: 06/28/2021/3:42:19 PM    Final         Scheduled Meds:  vitamin C  500 mg Oral Daily   [START ON 07/01/2021] atorvastatin  10 mg Oral Daily   enalapril  10 mg Oral Daily   enoxaparin (LOVENOX) injection  40 mg Subcutaneous Q24H   furosemide  20 mg Oral Daily   lidocaine  1 patch Transdermal Q24H   nirmatrelvir/ritonavir EUA  3 tablet Oral BID   pantoprazole  40 mg Oral Daily   sodium chloride flush  3 mL Intravenous Q12H   traZODone  50 mg Oral QHS   zinc sulfate  220 mg Oral Daily   Continuous Infusions:  sodium chloride 50 mL/hr at 06/28/21 1340   ampicillin (OMNIPEN) IV 2 g (06/28/21 1547)   cefTRIAXone (ROCEPHIN)  IV 2 g (06/28/21 1039)     LOS: 5 days    Time spent: over 77 min    Fayrene Helper, MD [facility]   To contact the attending provider between 7A-7P or the covering provider during after hours 7P-7A, please log into the web site [email or web address] and access using universal [place] password for that web site. If you do not have the password, please call the hospital operator.  06/28/2021, 6:18 PM

## 2021-06-28 NOTE — Progress Notes (Signed)
Subjective: Patient reports that her pain is slowly improving. No acute events overnight.  Objective: Vital signs in last 24 hours: Temp:  [98.2 F (36.8 C)-98.6 F (37 C)] 98.3 F (36.8 C) (09/27 0819) Pulse Rate:  [75-98] 75 (09/27 0819) Resp:  [17-18] 17 (09/27 0819) BP: (96-124)/(55-72) 124/72 (09/27 0819) SpO2:  [93 %-100 %] 94 % (09/27 0819)  Intake/Output from previous day: 09/26 0701 - 09/27 0700 In: 1348.8 [P.O.:720; I.V.:293.5; IV Piggyback:335.3] Out: 3300 [Urine:3300] Intake/Output this shift: No intake/output data recorded.  Physical Exam: Patient is awake, A/O X 4, and conversant. She is laying comfortably in bed in NAD. VSS. Speech is fluent and appropriate. MAEW with good strength. Sensation to light touch is intact. PERLA, EOMI. CNs grossly intact.  Lab Results: Recent Labs    06/27/21 0831 06/28/21 0347  WBC 7.2 8.9  HGB 11.4* 11.0*  HCT 36.3 34.4*  PLT 312 371   BMET Recent Labs    06/27/21 0030 06/28/21 0347  NA 140 136  K 3.4* 3.9  CL 100 103  CO2 29 26  GLUCOSE 87 95  BUN 8 7  CREATININE 0.92 0.69  CALCIUM 8.9 8.3*    Studies/Results: ECHOCARDIOGRAM LIMITED  Result Date: 06/26/2021    ECHOCARDIOGRAM LIMITED REPORT   Patient Name:   Mary Chambers Date of Exam: 06/26/2021 Medical Rec #:  [id number]                 Height:       67.0 in Accession #:    [id number]                Weight:       266.0 lb Date of Birth:  March 26, 1971                 BSA:          2.283 m Patient Age:    50 years                  BP:           108/51 mmHg Patient Gender: F                         HR:           102 bpm. Exam Location:  Inpatient Procedure: Limited Echo, Color Doppler and Cardiac Doppler Indications:    Bacteremia R78.81  History:        Patient has prior history of Echocardiogram examinations, most                 recent 05/26/2020. Risk Factors:Hypertension. Prior performed at                 [facility]:    [place] Referring  Phys: [phone] GREGORY D CALONE  Sonographer Comments: COVID+ at time of study IMPRESSIONS  1. Poor quality study bad acoustic windows limited.  2. Left ventricular ejection fraction, by estimation, is 55 to 60%. The left ventricle has normal function. Left ventricular diastolic parameters are indeterminate.  3. The mitral valve was not well visualized. No evidence of mitral valve regurgitation.  4. AV not well seen Some calcification and ? mild to moderate AR Cannot r/o vegetation Suggest TEE if clinically indicated . The aortic valve was not well visualized.  5. The inferior vena cava is normal in size with greater than 50% respiratory variability, suggesting right atrial pressure of 3  mmHg. FINDINGS  Left Ventricle: Left ventricular ejection fraction, by estimation, is 55 to 60%. The left ventricle has normal function. The left ventricular internal cavity size was normal in size. Left ventricular diastolic parameters are indeterminate. Pericardium: There is no evidence of pericardial effusion. Mitral Valve: The mitral valve was not well visualized. Tricuspid Valve: The tricuspid valve is not well visualized. Aortic Valve: AV not well seen Some calcification and ? mild to moderate AR Cannot r/o vegetation Suggest TEE if clinically indicated. The aortic valve was not well visualized. Pulmonic Valve: The pulmonic valve was not well visualized. Venous: The inferior vena cava is normal in size with greater than 50% respiratory variability, suggesting right atrial pressure of 3 mmHg. Additional Comments: Poor quality study bad acoustic windows limited. AORTIC VALVE LVOT Vmax:   79.80 cm/s LVOT Vmean:  54.200 cm/s LVOT VTI:    0.152 m  SHUNTS Systemic VTI: 0.15 m Jenkins Rouge MD Electronically signed by Jenkins Rouge MD Signature Date/Time: 06/26/2021/3:42:56 PM    Final     Assessment/Plan: 50 y.o. female with acute inflammatory changes in the right paraspinous tissue of the lumbar spine with L2-L5 septic arthritis.  She continues to be at her neurological baseline. Her pain is slowly improving Continue abx per ID. No new neurosurgical recommendations at this time. Call with any questions.   LOS: 5 days     Marvis Moeller, DNP, NP-C 06/28/2021, 9:14 AM

## 2021-06-28 NOTE — Transfer of Care (Signed)
Immediate Anesthesia Transfer of Care Note  Patient: Mary Chambers  Procedure(s) Performed: TRANSESOPHAGEAL ECHOCARDIOGRAM (TEE)  Patient Location: Endoscopy Unit  Anesthesia Type:MAC  Level of Consciousness: awake  Airway & Oxygen Therapy: Patient Spontanous Breathing and Patient connected to face mask oxygen  Post-op Assessment: Report given to RN and Post -op Vital signs reviewed and stable  Post vital signs: Reviewed and stable  Last Vitals:  Vitals Value Taken Time  BP    Temp    Pulse    Resp    SpO2      Last Pain:  Vitals:   06/28/21 1340  TempSrc: Oral  PainSc: 0-No pain      Patients Stated Pain Goal: 2 (40/34/74 2595)  Complications: No notable events documented.

## 2021-06-28 NOTE — Consult Note (Signed)
Cardiology Consultation:   Patient ID: Mary Chambers MRN: [id number]; DOB: 1971-05-10  Admit date: 06/22/2021 Date of Consult: 06/28/2021  PCP:  [name], NP   [facility] Providers Cardiologist:  New to [facility] [place]   Patient Profile:   Mary Chambers is a 50 y.o. female with a hx of HTN, HLD, obesity, hx of back surgery in 2020, who is currently admitted for lumbar spine septic arthritis and enterococcus faecalis bacteremia, who is being seen 06/28/2021 for the evaluation of vegetation on TEE at the request of Dr Florene Glen.  History of Present Illness:   [name] has no previous known cardiac conditions.   She presented to ER on 06/23/21 for back pain, prior to that she had visited ER for nausea and vomiting and diagnosed with UTI and was sent home with Keflex. She had worsening back pain radiating down to legs, with associated fever, urinary urgency, headache, sinus congestion, and a nonproductive cough at admission. MRI of the thoracic and lumbar spine ws concerned of L2-L5 septic arthritis, 2.4 x 1.8 x 1.6 cm collection concerning for abscess at T10-11. She was also tested positive for COVID-19. She was admitted to hospital medicine service for sepsis, started on broad spectrum antibiotic. Neurosurgery consulted and recommended no acute surgery, IR to aspirate septic joint. Abscess tap and blood culture grew Enterococcus faecalis. ID consulted, recommended TEE and 6 weeks ampicillin and CTX. TEE today showed bicuspid aortic valve with probable vegetation, severe AI. Cardiology consulted today for further input.   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 She has intermittent BLE swelling, on Lasix 20mg  daily, felt her swelling is worsened while she is here. She reports chronic orthopnea for years unchanged, denied PND. She denied any  chest pain at rest with exertion, dizziness, syncope. She states she started having some occasional chest tightness, dyspnea improves with oxygen since she was diagnosed with COVID -19 here, but overall symptoms are stable and improving.    She was tearful after hearing the report from Dr. Stanford Breed. We reviewed bicuspid vs. Tricuspid aortic valve, as well as endocarditis and the recommendations when there is sever AR.   Past Medical History:  Diagnosis Date   Arthritis    Depression    Hypertension    Kidney stone    Obesity     Past Surgical History:  Procedure Laterality Date   ABDOMINAL HYSTERECTOMY     BACK SURGERY     CARPAL TUNNEL RELEASE     CHOLECYSTECTOMY     IR FLUORO GUIDED NEEDLE PLC ASPIRATION/INJECTION LOC  06/23/2021   LITHOTRIPSY     neck fusion     TUBAL LIGATION       Home Medications:  Prior to Admission medications   Medication Sig Start Date End Date Taking? Authorizing Provider  Ascorbic Acid (VITAMIN C) 500 MG CAPS Take 500 mg by mouth daily.   Yes [provider]  atorvastatin (LIPITOR) 10 MG tablet Take 10 mg by mouth daily. 04/27/21  Yes [provider]  cholecalciferol (VITAMIN D3) 25 MCG (1000 UT) tablet Take 1,000 Units by mouth daily.   Yes [provider]  enalapril (VASOTEC) 10 MG tablet Take 10 mg by mouth daily.   Yes [provider]  fluticasone (FLONASE) 50 MCG/ACT nasal spray Place 2 sprays into both nostrils daily. Patient taking differently: Place 2 sprays  into both nostrils daily as needed for allergies. 05/03/21  Yes Brunetta Jeans, PA-C  furosemide (LASIX) 20 MG tablet Take 20 mg by mouth daily. 05/31/21  Yes [provider]  hydrochlorothiazide (HYDRODIURIL) 25 MG tablet Take 25 mg by mouth daily.   Yes [provider]  LOMAIRA 8 MG TABS Take 8 mg by mouth daily. 06/14/21  Yes [provider]  Multiple Vitamin (MULTIVITAMIN) capsule Take 1 capsule by mouth daily.    Yes  [provider]  naproxen (NAPROSYN) 250 MG tablet Take 250 mg by mouth 2 (two) times daily. 06/03/21  Yes [provider]  ondansetron (ZOFRAN ODT) 4 MG disintegrating tablet Take 1 tablet (4 mg total) by mouth every 8 (eight) hours as needed for up to 3 days for nausea or vomiting. 06/19/21 06/23/22 Yes Cardama, Grayce Sessions, MD  pantoprazole (PROTONIX) 40 MG tablet Take 40 mg by mouth daily. 04/22/21  Yes [provider]  POTASSIUM PO Take 1,000 mg by mouth daily.   Yes [provider]  tiZANidine (ZANAFLEX) 4 MG tablet Take 4 mg by mouth 2 (two) times daily as needed (for back pain/sleep). 06/20/21  Yes [provider]  traMADol (ULTRAM) 50 MG tablet Take 50 mg by mouth every 12 (twelve) hours as needed for moderate pain. 02/02/21  Yes [provider]  benzonatate (TESSALON) 100 MG capsule Take 1 capsule (100 mg total) by mouth 3 (three) times daily as needed. Patient not taking: Reported on 06/23/2021 05/03/21   Mar Daring, PA-C  cephALEXin (KEFLEX) 500 MG capsule Take 1 capsule (500 mg total) by mouth 3 (three) times daily for 10 days. Patient not taking: Reported on 06/23/2021 06/19/21 06/29/21  Fatima Blank, MD  naproxen (NAPROSYN) 500 MG tablet Take 1 tablet (500 mg total) by mouth 2 (two) times daily with a meal. Patient not taking: Reported on 06/23/2021 03/30/21   Brunetta Jeans, PA-C  diphenhydrAMINE (BENADRYL) 25 mg capsule Take by mouth. 03/10/16 06/21/19  [provider]  LYRICA 50 MG capsule Take 50 mg by mouth 3 (three) times daily. 04/15/18 06/07/19  [provider]    Inpatient Medications: Scheduled Meds:  vitamin C  500 mg Oral Daily   [START ON 07/01/2021] atorvastatin  10 mg Oral Daily   enalapril  10 mg Oral Daily   enoxaparin (LOVENOX) injection  40 mg Subcutaneous Q24H   furosemide  20 mg Oral Daily   lidocaine  1 patch Transdermal Q24H   nirmatrelvir/ritonavir EUA  3 tablet Oral BID    pantoprazole  40 mg Oral Daily   sodium chloride flush  3 mL Intravenous Q12H   traZODone  50 mg Oral QHS   zinc sulfate  220 mg Oral Daily   Continuous Infusions:  sodium chloride 50 mL/hr at 06/28/21 1340   ampicillin (OMNIPEN) IV 2 g (06/28/21 1547)   cefTRIAXone (ROCEPHIN)  IV 2 g (06/28/21 1039)   PRN Meds: sodium chloride, acetaminophen **OR** acetaminophen, albuterol, diclofenac Sodium, fentaNYL (SUBLIMAZE) injection, guaiFENesin-dextromethorphan, HYDROcodone-acetaminophen **OR** HYDROcodone-acetaminophen, ondansetron **OR** ondansetron (ZOFRAN) IV  Allergies:    Allergies  Allergen Reactions   Baclofen Hives   Doxycycline Itching   Percocet [Oxycodone-Acetaminophen] Hives   Septra [Sulfamethoxazole-Trimethoprim] Hives   Gabapentin Rash    Social History:   Social History   Socioeconomic History   Marital status: Single    Spouse name: Not on file   Number of children: Not on file   Years of education: Not on file  Highest education level: Not on file  Occupational History   Not on file  Tobacco Use   Smoking status: Never   Smokeless tobacco: Never  Vaping Use   Vaping Use: Never used  Substance and Sexual Activity   Alcohol use: No   Drug use: Never   Sexual activity: Not Currently    Birth control/protection: None  Other Topics Concern   Not on file  Social History Narrative   Not on file   Social Determinants of Health   Financial Resource Strain: Not on file  Food Insecurity: Not on file  Transportation Needs: Not on file  Physical Activity: Not on file  Stress: Not on file  Social Connections: Not on file  Intimate Partner Violence: Not on file    Family History:    Family History  Problem Relation Age of Onset   Diabetes Mother    Hypertension Mother    Cancer Father    Parkinson's disease Father    Heart disease Father    Heart disease Sister    Heart disease Brother    Colon cancer Neg Hx    Rectal cancer Neg Hx    Stomach  cancer Neg Hx      ROS:  Constitutional: Denied fever, chills, malaise, night sweats Eyes: Denied vision change or loss Ears/Nose/Mouth/Throat: Denied ear ache, sore throat, coughing, sinus pain Cardiovascular: see HPI  Respiratory: see HPI  Gastrointestinal: Denied nausea, vomiting, abdominal pain, diarrhea Genital/Urinary: Denied dysuria, hematuria, urinary frequency/urgency Musculoskeletal: see HPI  Skin: Denied rash, wound Neuro: Denied headache, dizziness, syncope Psych: Denied history of depression/anxiety  Endocrine: Denied history of diabetes   Physical Exam/Data:   Vitals:   06/28/21 1417 06/28/21 1426 06/28/21 1435 06/28/21 1500  BP: 105/62 111/72 124/64 120/67  Pulse: 89 91 92 87  Resp: 17 16 17 17   Temp: 98.5 F (36.9 C)   98.2 F (36.8 C)  TempSrc: Oral   Oral  SpO2: 96% 97% 99% 100%  Weight:      Height:        Intake/Output Summary (Last 24 hours) at 06/28/2021 1829 Last data filed at 06/28/2021 1531 Gross per 24 hour  Intake 100 ml  Output 1950 ml  Net -1850 ml   Last 3 Weights 06/22/2021 02/04/2021 12/15/2020  Weight (lbs) 266 lb 278 lb 278 lb  Weight (kg) 120.657 kg 126.1 kg 126.1 kg     Body mass index is 41.66 kg/m.   General:  Well nourished, well developed, in no acute distress HEENT: normal Neck: no JVD Vascular: No carotid bruits; Distal pulses 2+ bilaterally Cardiac:  normal S1, S2; RRR; 1/4 diastolic rumble Lungs:  clear to auscultation bilaterally, no wheezing, rhonchi or rales  Abd: soft, nontender, no hepatomegaly  Ext: no edema Musculoskeletal:  No deformities, BUE and BLE strength normal and equal Skin: warm and dry  Neuro:  CNs 2-12 intact, no focal abnormalities noted Psych:  Normal affect   EKG:  The EKG was personally reviewed and demonstrates:  EKG from 06/23/21 with SR 91 bpm  Telemetry:  Telemetry was personally reviewed and demonstrates:  NSR  Relevant CV Studies:  TEE 06/28/21:    1. Bicuspid aortic valve (left and  right cusps appear to be fused);  probable vegetation noted; severe AI.   2. Left ventricular ejection fraction, by estimation, is 60 to 65%. The  left ventricle has normal function. The left ventricle has no regional  wall motion abnormalities.   3. Right ventricular systolic  function is normal. The right ventricular  size is normal.   4. No left atrial/left atrial appendage thrombus was detected.   5. The mitral valve is normal in structure. No evidence of mitral valve  regurgitation.   6. The aortic valve is bicuspid. Aortic valve regurgitation is severe.   Echo from 06/26/21:   1. Poor quality study bad acoustic windows limited.   2. Left ventricular ejection fraction, by estimation, is 55 to 60%. The  left ventricle has normal function. Left ventricular diastolic parameters  are indeterminate.   3. The mitral valve was not well visualized. No evidence of mitral valve  regurgitation.   4. AV not well seen Some calcification and ? mild to moderate AR Cannot  r/o vegetation Suggest TEE if clinically indicated . The aortic valve was  not well visualized.   5. The inferior vena cava is normal in size with greater than 50%  respiratory variability, suggesting right atrial pressure of 3 mmHg.     Laboratory Data:  High Sensitivity Troponin:  No results for input(s): TROPONINIHS in the last 720 hours.   Chemistry Recent Labs  Lab 06/26/21 0110 06/27/21 0030 07-05-2021 0347  NA 139 140 136  K 3.5 3.4* 3.9  CL 103 100 103  CO2 28 29 26   GLUCOSE 119* 87 95  BUN 7 8 7   CREATININE 0.87 0.92 0.69  CALCIUM 8.3* 8.9 8.3*  MG 1.6* 2.0 1.9  GFRNONAA >60 >60 >60  ANIONGAP 8 11 7     Recent Labs  Lab 06/26/21 0110 06/27/21 0030 07/05/21 0347  PROT 6.4* 8.3* 6.4*  ALBUMIN 2.2* 2.7* 2.2*  AST 20 22 16   ALT 32 33 24  ALKPHOS 92 105 85  BILITOT 0.4 0.5 0.3   Lipids No results for input(s): CHOL, TRIG, HDL, LABVLDL, LDLCALC, CHOLHDL in the last 168 hours.  Hematology Recent  Labs  Lab 06/26/21 0110 06/27/21 0831 Jul 05, 2021 0347  WBC 8.0 7.2 8.9  RBC 3.78* 4.24 4.06  HGB 10.2* 11.4* 11.0*  HCT 32.4* 36.3 34.4*  MCV 85.7 85.6 84.7  MCH 27.0 26.9 27.1  MCHC 31.5 31.4 32.0  RDW 13.5 13.5 13.5  PLT 311 312 371   Thyroid No results for input(s): TSH, FREET4 in the last 168 hours.  BNPNo results for input(s): BNP, PROBNP in the last 168 hours.  DDimer No results for input(s): DDIMER in the last 168 hours.   Radiology/Studies:  ECHO TEE  Result Date: 07/05/2021    TRANSESOPHOGEAL ECHO REPORT   Patient Name:   Mary Chambers Date of Exam: 07-05-2021 Medical Rec #:  [id number]                 Height:       67.0 in Accession #:    [id number]                Weight:       266.0 lb Date of Birth:  [birth year]                 BSA:          2.283 m Patient Age:    49 years                  BP:           120/67 mmHg Patient Gender: F  HR:           104 bpm. Exam Location:  Inpatient Procedure: Cardiac Doppler, Color Doppler and Transesophageal Echo Indications:     Bacteremia  History:         Patient has prior history of Echocardiogram examinations, most                  recent 06/26/2021.  Sonographer:     Merrie Roof RDCS Referring Phys:  [id number] Abigail Butts Diagnosing Phys: Kirk Ruths MD PROCEDURE: After discussion of the risks and benefits of a TEE, an informed consent was obtained from the patient. The transesophogeal probe was passed without difficulty through the esophogus of the patient. Local oropharyngeal anesthetic was provided with Cetacaine. Sedation performed by different physician. The patient was monitored while under deep sedation. The patient's vital signs; including heart rate, blood pressure, and oxygen saturation; remained stable throughout the procedure. The patient developed no complications during the procedure. IMPRESSIONS  1. Bicuspid aortic valve (left and right cusps appear to be fused); probable vegetation noted;  severe AI.  2. Left ventricular ejection fraction, by estimation, is 60 to 65%. The left ventricle has normal function. The left ventricle has no regional wall motion abnormalities.  3. Right ventricular systolic function is normal. The right ventricular size is normal.  4. No left atrial/left atrial appendage thrombus was detected.  5. The mitral valve is normal in structure. No evidence of mitral valve regurgitation.  6. The aortic valve is bicuspid. Aortic valve regurgitation is severe. FINDINGS  Left Ventricle: Left ventricular ejection fraction, by estimation, is 60 to 65%. The left ventricle has normal function. The left ventricle has no regional wall motion abnormalities. The left ventricular internal cavity size was normal in size. Right Ventricle: The right ventricular size is normal. Right ventricular systolic function is normal. Left Atrium: Left atrial size was normal in size. No left atrial/left atrial appendage thrombus was detected. Right Atrium: Right atrial size was normal in size. Pericardium: There is no evidence of pericardial effusion. Mitral Valve: The mitral valve is normal in structure. No evidence of mitral valve regurgitation. Tricuspid Valve: The tricuspid valve is normal in structure. Tricuspid valve regurgitation is trivial. Aortic Valve: The aortic valve is bicuspid. Aortic valve regurgitation is severe. Pulmonic Valve: The pulmonic valve was normal in structure. Pulmonic valve regurgitation is trivial. Aorta: The aortic root is normal in size and structure. There is minimal (Grade I) plaque involving the descending aorta. IAS/Shunts: No atrial level shunt detected by color flow Doppler. Additional Comments: Bicuspid aortic valve (left and right cusps appear to be fused); probable vegetation noted; severe AI. Kirk Ruths MD Electronically signed by Kirk Ruths MD Signature Date/Time: 06/28/2021/3:42:19 PM    Final    ECHOCARDIOGRAM LIMITED  Result Date: 06/26/2021     ECHOCARDIOGRAM LIMITED REPORT   Patient Name:   Mary Chambers Date of Exam: 06/26/2021 Medical Rec #:  [id number]                 Height:       67.0 in Accession #:    [id number]                Weight:       266.0 lb Date of Birth:  Dec 12, 1970                 BSA:          2.283 m Patient Age:    69  years                  BP:           108/51 mmHg Patient Gender: F                         HR:           102 bpm. Exam Location:  Inpatient Procedure: Limited Echo, Color Doppler and Cardiac Doppler Indications:    Bacteremia R78.81  History:        Patient has prior history of Echocardiogram examinations, most                 recent 05/26/2020. Risk Factors:Hypertension. Prior performed at                 [place]:    [place] Referring Phys: [phone] GREGORY D CALONE  Sonographer Comments: COVID+ at time of study IMPRESSIONS  1. Poor quality study bad acoustic windows limited.  2. Left ventricular ejection fraction, by estimation, is 55 to 60%. The left ventricle has normal function. Left ventricular diastolic parameters are indeterminate.  3. The mitral valve was not well visualized. No evidence of mitral valve regurgitation.  4. AV not well seen Some calcification and ? mild to moderate AR Cannot r/o vegetation Suggest TEE if clinically indicated . The aortic valve was not well visualized.  5. The inferior vena cava is normal in size with greater than 50% respiratory variability, suggesting right atrial pressure of 3 mmHg. FINDINGS  Left Ventricle: Left ventricular ejection fraction, by estimation, is 55 to 60%. The left ventricle has normal function. The left ventricular internal cavity size was normal in size. Left ventricular diastolic parameters are indeterminate. Pericardium: There is no evidence of pericardial effusion. Mitral Valve: The mitral valve was not well visualized. Tricuspid Valve: The tricuspid valve is not well visualized. Aortic Valve: AV not well seen Some  calcification and ? mild to moderate AR Cannot r/o vegetation Suggest TEE if clinically indicated. The aortic valve was not well visualized. Pulmonic Valve: The pulmonic valve was not well visualized. Venous: The inferior vena cava is normal in size with greater than 50% respiratory variability, suggesting right atrial pressure of 3 mmHg. Additional Comments: Poor quality study bad acoustic windows limited. AORTIC VALVE LVOT Vmax:   79.80 cm/s LVOT Vmean:  54.200 cm/s LVOT VTI:    0.152 m  SHUNTS Systemic VTI: 0.15 m Jenkins Rouge MD Electronically signed by Jenkins Rouge MD Signature Date/Time: 06/26/2021/3:42:56 PM    Final      Assessment and Plan:   Enterococcus faecalis bacteremia 2/2 lumbar vertebral osteomyelitis with paraspinal abscess, in the setting of hx of L2/3 expoloration and L1/2 discetomy and fusion with pedicle screw fixation and posterolateral arthrodesis - repeat Bcx from 06/24/21 NTD, on ampicillin and CTX, no OR planned per NSGY,  managed per ID  - we discussed that given the severe AI, surgery is indicated, but timing depends on how she is doing clinically and whether we feel that the infection is under control. Defer timing to ID and CT surgery  Aortic valve endocarditis  Severe aortic regurgitation  Chronic intermittent BLE edema  Bicuspid aortic valve - TEE completed on 06/28/21 showed bicuspid aortic valve with probable vegetation and severe AI, EF 60-65%, no LA/LAA thrombus  - consult CT surgery to evaluate for aortic valve surgery given severe regurgitation - clinically stable, euvolemic, on room air -  ok to continue home meds Lasix 20mg  daily  - plan to repeat Echo post antibiotic course for re-evaluation of AI  -we discussed that her first degree family members (brother and three children) should all be screened for bicuspid aortic valve  COVID-19 infection - mildly symptomatic, on Paxlovid currently, managed per IM   HTN - BP controlled on home meds enalapril, lasix;  HCTZ was discontinued   HLD - statin held while on Paxlovid, will need lipid panel at some point    Risk Assessment/Risk Scores:    For questions or updates, please contact [place] Please consult [email or web address] for contact info under    Signed, [name], MD  06/28/2021 6:29 PM History and note prepared by Margie Billet, updated with my additional comments and my physical exam.

## 2021-06-28 NOTE — Anesthesia Postprocedure Evaluation (Signed)
Anesthesia Post Note  Patient: Mary Chambers  Procedure(s) Performed: TRANSESOPHAGEAL ECHOCARDIOGRAM (TEE)     Patient location during evaluation: PACU Anesthesia Type: MAC Level of consciousness: awake and alert Pain management: pain level controlled Vital Signs Assessment: post-procedure vital signs reviewed and stable Respiratory status: spontaneous breathing Cardiovascular status: stable Anesthetic complications: no   No notable events documented.  Last Vitals:  Vitals:   06/28/21 1435 06/28/21 1500  BP: 124/64 120/67  Pulse: 92 87  Resp: 17 17  Temp:  36.8 C  SpO2: 99% 100%    Last Pain:  Vitals:   06/28/21 1500  TempSrc: Oral  PainSc:                  Nolon Nations

## 2021-06-28 NOTE — Progress Notes (Signed)
    Transesophageal Echocardiogram Note  Mary Chambers [id number] Jan 05, 1971  Procedure: Transesophageal Echocardiogram Indications: Bacteremia  Procedure Details Consent: Obtained Time Out: Verified patient identification, verified procedure, site/side was marked, verified correct patient position, special equipment/implants available, Radiology Safety Procedures followed,  medications/allergies/relevent history reviewed, required imaging and test results available.  Performed  Medications:  Pt sedated by anesthesia with diprovanIV.   Normal LV function; bicuspid aortic valve with probable vegetation; moderate to severe AI.   Complications: No apparent complications Patient did tolerate procedure well.  Kirk Ruths, MD

## 2021-06-28 NOTE — Anesthesia Preprocedure Evaluation (Signed)
Anesthesia Evaluation  Patient identified by MRN, date of birth, ID band Patient awake    Reviewed: Allergy & Precautions, NPO status , Patient's Chart, lab work & pertinent test results  Airway Mallampati: I  TM Distance: >3 FB Neck ROM: Full   Comment: Grade I views previously with MAC 4 blade  Dental no notable dental hx.    Pulmonary neg pulmonary ROS,    Pulmonary exam normal breath sounds clear to auscultation       Cardiovascular hypertension, Pt. on medications Normal cardiovascular exam Rhythm:Regular Rate:Normal  Echo 06/26/2021 1. Poor quality study bad acoustic windows limited.  2. Left ventricular ejection fraction, by estimation, is 55 to 60%. The left ventricle has normal function. Left ventricular diastolic parameters are indeterminate.  3. The mitral valve was not well visualized. No evidence of mitral valve regurgitation.  4. AV not well seen Some calcification and ? mild to moderate AR Cannot r/o vegetation Suggest TEE if clinically indicated. The aortic valve was not well visualized.  5. The inferior vena cava is normal in size with greater than 50% respiratory variability, suggesting right atrial pressure of 3 mmHg   Neuro/Psych  Headaches, PSYCHIATRIC DISORDERS Anxiety Depression    GI/Hepatic Neg liver ROS, GERD  ,  Endo/Other  Morbid obesity  Renal/GU Renal disease     Musculoskeletal  (+) Arthritis ,   Abdominal   Peds  Hematology negative hematology ROS (+)   Anesthesia Other Findings   Reproductive/Obstetrics negative OB ROS                             Anesthesia Physical  Anesthesia Plan  ASA: 3  Anesthesia Plan: General   Post-op Pain Management:    Induction: Intravenous  PONV Risk Score and Plan: 2 and Ondansetron, Treatment may vary due to age or medical condition and Propofol infusion  Airway Management Planned: Natural Airway  Additional  Equipment:   Intra-op Plan:   Post-operative Plan:   Informed Consent:   Plan Discussed with:   Anesthesia Plan Comments:        Anesthesia Quick Evaluation

## 2021-06-28 NOTE — Plan of Care (Signed)

## 2021-06-28 NOTE — Plan of Care (Signed)

## 2021-06-29 ENCOUNTER — Inpatient Hospital Stay: Payer: Self-pay

## 2021-06-29 ENCOUNTER — Encounter (HOSPITAL_COMMUNITY): Payer: Self-pay | Admitting: Cardiology

## 2021-06-29 ENCOUNTER — Inpatient Hospital Stay (HOSPITAL_COMMUNITY): Payer: 59

## 2021-06-29 DIAGNOSIS — A419 Sepsis, unspecified organism: Secondary | ICD-10-CM | POA: Diagnosis not present

## 2021-06-29 DIAGNOSIS — I10 Essential (primary) hypertension: Secondary | ICD-10-CM | POA: Diagnosis not present

## 2021-06-29 DIAGNOSIS — U071 COVID-19: Secondary | ICD-10-CM | POA: Diagnosis not present

## 2021-06-29 DIAGNOSIS — I350 Nonrheumatic aortic (valve) stenosis: Secondary | ICD-10-CM | POA: Diagnosis not present

## 2021-06-29 DIAGNOSIS — I339 Acute and subacute endocarditis, unspecified: Secondary | ICD-10-CM | POA: Diagnosis not present

## 2021-06-29 DIAGNOSIS — M4650 Other infective spondylopathies, site unspecified: Secondary | ICD-10-CM | POA: Diagnosis not present

## 2021-06-29 DIAGNOSIS — R6520 Severe sepsis without septic shock: Secondary | ICD-10-CM | POA: Diagnosis not present

## 2021-06-29 LAB — COMPREHENSIVE METABOLIC PANEL
ALT: 20 U/L (ref 0–44)
AST: 17 U/L (ref 15–41)
Albumin: 2.3 g/dL — ABNORMAL LOW (ref 3.5–5.0)
Alkaline Phosphatase: 80 U/L (ref 38–126)
Anion gap: 7 (ref 5–15)
BUN: 7 mg/dL (ref 6–20)
CO2: 27 mmol/L (ref 22–32)
Calcium: 8.3 mg/dL — ABNORMAL LOW (ref 8.9–10.3)
Chloride: 102 mmol/L (ref 98–111)
Creatinine, Ser: 0.8 mg/dL (ref 0.44–1.00)
GFR, Estimated: >60 mL/min (ref >60)
Glucose, Bld: 89 mg/dL (ref 70–99)
Potassium: 3.7 mmol/L (ref 3.5–5.1)
Sodium: 136 mmol/L (ref 135–145)
Total Bilirubin: 0.5 mg/dL (ref 0.3–1.2)
Total Protein: 6.6 g/dL (ref 6.5–8.1)

## 2021-06-29 LAB — CBC WITH DIFFERENTIAL/PLATELET
Abs Immature Granulocytes: 0.02 10*3/uL (ref 0.00–0.07)
Basophils Absolute: 0 10*3/uL (ref 0.0–0.1)
Basophils Relative: 0 %
Eosinophils Absolute: 0.1 10*3/uL (ref 0.0–0.5)
Eosinophils Relative: 2 %
HCT: 34.6 % — ABNORMAL LOW (ref 36.0–46.0)
Hemoglobin: 10.9 g/dL — ABNORMAL LOW (ref 12.0–15.0)
Immature Granulocytes: 0 %
Lymphocytes Relative: 34 %
Lymphs Abs: 2.7 10*3/uL (ref 0.7–4.0)
MCH: 26.8 pg (ref 26.0–34.0)
MCHC: 31.5 g/dL (ref 30.0–36.0)
MCV: 85 fL (ref 80.0–100.0)
Monocytes Absolute: 0.7 10*3/uL (ref 0.1–1.0)
Monocytes Relative: 8 %
Neutro Abs: 4.5 10*3/uL (ref 1.7–7.7)
Neutrophils Relative %: 56 %
Platelets: 392 10*3/uL (ref 150–400)
RBC: 4.07 MIL/uL (ref 3.87–5.11)
RDW: 13.6 % (ref 11.5–15.5)
WBC: 8 10*3/uL (ref 4.0–10.5)
nRBC: 0 % (ref 0.0–0.2)

## 2021-06-29 LAB — PHOSPHORUS: Phosphorus: 3.5 mg/dL (ref 2.5–4.6)

## 2021-06-29 LAB — CULTURE, BLOOD (ROUTINE X 2)
Culture: NO GROWTH
Culture: NO GROWTH
Special Requests: ADEQUATE
Special Requests: ADEQUATE

## 2021-06-29 LAB — MAGNESIUM: Magnesium: 1.9 mg/dL (ref 1.7–2.4)

## 2021-06-29 IMAGING — DX DG ORTHOPANTOGRAM /PANORAMIC
1 series · 1 of 1 positions shown · non-contrast
Comparison: None.

CLINICAL DATA: Poor dentition for heart valve surgery.

EXAM:
ORTHOPANTOGRAM/PANORAMIC

[view not recorded]
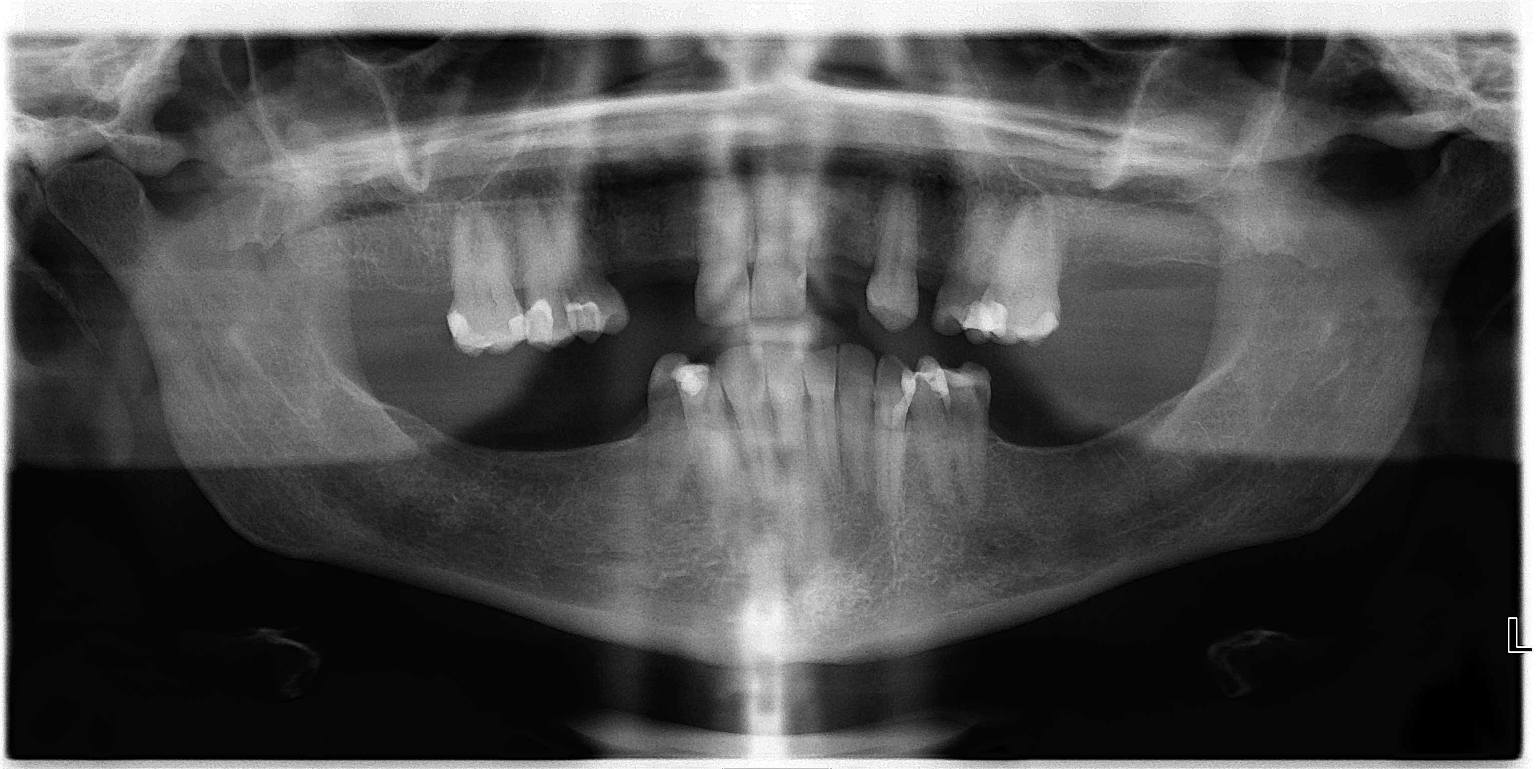

[1 of 1 positions shown; findings below may reference images not displayed]

FINDINGS: Multiple mandibular and maxillary teeth. No visible periapical
abscess. No focal mandibular abnormality.
IMPRESSION: No acute findings.

## 2021-06-29 MED ORDER — LOPERAMIDE HCL 1 MG/7.5ML PO SUSP
2.0000 mg | Freq: Once | ORAL | Status: AC
  Administered 2021-06-29: 2 mg via ORAL
  Filled 2021-06-29: qty 15

## 2021-06-29 NOTE — Evaluation (Signed)
Occupational Therapy Evaluation Patient Details Name: Mary Chambers MRN: [id number] DOB: Jun 13, 1971 Today's Date: 06/29/2021   History of Present Illness Mary Chambers is a 50 y.o. female who presents with complaints of back pain. Subsequently developed worsening symptoms, radiating pain down her R leg.  Fevers.  MRI was concerning for septic arthritis of the spine with possible abscess. s/p IR aspiration of right posterior paraspinal abscess, culture with rare enterococcus faecalis; incidental covid positive finding as well;  with medical history significant of hypertension, nephrolithiasis, morbid obesity, and prior back surgery back in 2020   Clinical Impression   Patient is s/p IR aspiration of R posterior paraspinal abscess surgery  COVID + resulting in functional limitations due to the deficits listed below (see OT problem list). Pt decreased activity tolerance. Patient will benefit from skilled OT acutely to increase independence and safety with ADLS to allow discharge home.       Recommendations for follow up therapy are one component of a multi-disciplinary discharge planning process, led by the attending physician.  Recommendations may be updated based on patient status, additional functional criteria and insurance authorization.   Follow Up Recommendations  No OT follow up    Equipment Recommendations  None recommended by OT    Recommendations for Other Services       Precautions / Restrictions Precautions Precautions: Back Precaution Comments: Back precautions for comfort      Mobility Bed Mobility Overal bed mobility: Needs Assistance   Rolling: Min guard Sidelying to sit: Min guard            Transfers Overall transfer level: Needs assistance Equipment used: Rolling walker (2 wheeled) Transfers: Sit to/from Stand Sit to Stand: Min guard              Balance                                           ADL  either performed or assessed with clinical judgement   ADL Overall ADL's : Needs assistance/impaired Eating/Feeding: Independent   Grooming: Wash/dry hands;Supervision/safety;Standing   Upper Body Bathing: Modified independent;Sitting   Lower Body Bathing: Supervison/ safety;Sit to/from stand   Upper Body Dressing : Supervision/safety;Sitting   Lower Body Dressing: Supervision/safety;Sit to/from stand   Toilet Transfer: Supervision/safety;BSC;RW   Toileting- Water quality scientist and Hygiene: Supervision/safety;Sit to/from stand       Functional mobility during ADLs: Supervision/safety;Rolling walker       Vision Baseline Vision/History: 0 No visual deficits       Perception     Praxis      Pertinent Vitals/Pain Pain Assessment: 0-10 Pain Score: 5  Pain Location: back and R leg Pain Descriptors / Indicators: Aching Pain Intervention(s): Monitored during session;RN gave pain meds during session     Hand Dominance Right   Extremity/Trunk Assessment Upper Extremity Assessment Upper Extremity Assessment: Overall WFL for tasks assessed   Lower Extremity Assessment Lower Extremity Assessment: Defer to PT evaluation   Cervical / Trunk Assessment Cervical / Trunk Assessment: Other exceptions Cervical / Trunk Exceptions: Infection in back; post IR aspiration for culture   Communication Communication Communication: No difficulties   Cognition Arousal/Alertness: Awake/alert Behavior During Therapy: WFL for tasks assessed/performed Overall Cognitive Status: Within Functional Limits for tasks assessed  General Comments  pt requesting medication from RN during session. Pt reports needing more opportunites for OOB    Exercises     Shoulder Instructions      Home Living Family/patient expects to be discharged to:: Private residence Living Arrangements: Children;Other relatives;Non-relatives/Friends Available  Help at Discharge: Family;Available PRN/intermittently Type of Home: House Home Access: Stairs to enter Entrance Stairs-Number of Steps: 1   Home Layout: One level;Able to live on main level with bedroom/bathroom     Bathroom Shower/Tub: Teacher, early years/pre: Standard     Home Equipment: Cane - single point          Prior Functioning/Environment Level of Independence: Independent        Comments: drives, still works; enjoys taking her lunch outside        OT Problem List: Decreased activity tolerance;Impaired balance (sitting and/or standing);Decreased safety awareness;Decreased knowledge of use of DME or AE;Decreased knowledge of precautions;Obesity      OT Treatment/Interventions: Self-care/ADL training;Therapeutic exercise;DME and/or AE instruction;Energy conservation;Therapeutic activities;Patient/family education;Balance training    OT Goals(Current goals can be found in the care plan section) Acute Rehab OT Goals Patient Stated Goal: to be able to get up more OT Goal Formulation: With patient Time For Goal Achievement: 07/13/21 Potential to Achieve Goals: Good  OT Frequency: Min 2X/week   Barriers to D/C:            Co-evaluation              AM-PAC OT "6 Clicks" Daily Activity     Outcome Measure Help from another person eating meals?: None Help from another person taking care of personal grooming?: None Help from another person toileting, which includes using toliet, bedpan, or urinal?: None Help from another person bathing (including washing, rinsing, drying)?: None Help from another person to put on and taking off regular upper body clothing?: None Help from another person to put on and taking off regular lower body clothing?: None 6 Click Score: 24   End of Session Equipment Utilized During Treatment: Rolling walker Nurse Communication: Mobility status;Precautions  Activity Tolerance: Patient tolerated treatment well Patient  left: in bed;with call bell/phone within reach;with bed alarm set  OT Visit Diagnosis: Unsteadiness on feet (R26.81)                Time: 9373-4287 OT Time Calculation (min): 14 min Charges:  OT General Charges $OT Visit: 1 Visit OT Evaluation $OT Eval Moderate Complexity: 1 Mod   Brynn, OTR/L  Acute Rehabilitation Services Pager: [phone] Office: [phone] .   Jeri Modena 06/29/2021, 5:14 PM

## 2021-06-29 NOTE — Progress Notes (Signed)
PROGRESS NOTE   Mary Chambers  UXN:[id number] DOB: September 02, 1971 DOA: 06/22/2021 PCP: [name], NP  Brief Narrative:  50 year old home dwelling white female HTN nephrolithiasis morbid obesity BMI 42 Status post PLIF L1-L3 10/01/2019 Dr. Saintclair Halsted Recent Rx urgent care urinary tract infection discharged on Keflex found to have right lower back pain sharp in nature radiating down right leg T-max 101 Also nausea vomiting On admission WBC 11 sed rate 96 MRI = inflammatory changes L2-L5 + septic arthritis +2.4 X1.8X 1.6.  Area of abscess Neurosurgery consulted, IR also consulted underwent aspiration under IR showing Enterococcus faecalis Cardiology consulted in addition could not rule out vegetation TEE showed bicuspid AV valve with possible vegetation Cardiothoracic surgery involved Also found to be coincidently positive for coronavirus 19   Hospital-Problem based course  Enterococcus faecalis endocarditis - Will need definitive valve surgery as per CVTS-they are aware of the patient-no emergent surgery is required at this time per them - Continue Ceftriaxone with prolonged course as determined by ID potentially 6 weeks  Septic arthritis with spinal abscess -No definitive surgery planned by neurosurgery, appreciate insights -Medications as above, continue ampicillin long-term  Mild COVID-19 infection -Minimal symptoms, DC precaution 10/2 -Paxlovid completed 9/27  Recent urinary tract infection -Complete antibiotics  HTN - Continue enalapril 10 daily, Lasix 20 daily  Low back pain -Likely from her underlying abscess-first choice Norco every 4 as needed moderate pain second choice higher dosing Norco third choice Fentanyl 25 mcg every 3 as needed   DVT prophylaxis: Lovenox Code Status: Full code Family Communication: None present Disposition:  Status is: Inpatient  Remains inpatient appropriate because:Persistent severe electrolyte disturbances and Ongoing  diagnostic testing needed not appropriate for outpatient work up  Dispo: The patient is from: Home              Anticipated d/c is to: SNF              Patient currently is not medically stable to d/c.   Difficult to place patient No       Consultants:  Infectious disease Neurosurgery Cardiology  Procedures: Multiple  Antimicrobials:  Currently ceftriaxone and ampicillin   Subjective: Awake coherent sitting in bed-uncomfortable and moves No chest pain no fever Denies nausea vomiting sometimes is incontinent to loose stool She understands the need for further hospitalization care  Objective: Vitals:   06/28/21 1500 06/28/21 2027 06/29/21 0447 06/29/21 0833  BP: 120/67 (!) 93/55 93/67 (!) 119/59  Pulse: 87 99 98 95  Resp: 17   17  Temp: 98.2 F (36.8 C) 99.3 F (37.4 C) 98.4 F (36.9 C) 98.3 F (36.8 C)  TempSrc: Oral Oral Oral Oral  SpO2: 100% 100% 99% 94%  Weight:      Height:        Intake/Output Summary (Last 24 hours) at 06/29/2021 1316 Last data filed at 06/29/2021 0900 Gross per 24 hour  Intake 100 ml  Output 2350 ml  Net -2250 ml   Filed Weights   06/22/21 1940  Weight: 120.7 kg    Examination:  Awake obese coherent no distress no icterus no pallor CTA B no added sound thick neck Mallampati 4  S2 no murmur no rub no gallop ROM intact moving 4 limbs equally without deficit power 5/5 reflexes deferred sensory grossly intact smile is symmetric shoulder shrug is intact uvula protrudes midline  Data Reviewed: personally reviewed   CBC    Component Value Date/Time   WBC 8.0 06/29/2021 0403  RBC 4.07 06/29/2021 0403   HGB 10.9 (L) 06/29/2021 0403   HGB 14.5 09/28/2014 1405   HCT 34.6 (L) 06/29/2021 0403   HCT 44.1 09/28/2014 1405   PLT 392 06/29/2021 0403   PLT 260 09/28/2014 1405   MCV 85.0 06/29/2021 0403   MCV 87 09/28/2014 1405   MCH 26.8 06/29/2021 0403   MCHC 31.5 06/29/2021 0403   RDW 13.6 06/29/2021 0403   RDW 13.5 09/28/2014  1405   LYMPHSABS 2.7 06/29/2021 0403   LYMPHSABS 2.7 09/28/2014 1405   MONOABS 0.7 06/29/2021 0403   MONOABS 0.7 09/28/2014 1405   EOSABS 0.1 06/29/2021 0403   EOSABS 0.3 09/28/2014 1405   BASOSABS 0.0 06/29/2021 0403   BASOSABS 0.1 09/28/2014 1405   CMP Latest Ref Rng & Units 06/29/2021 06/28/2021 06/27/2021  Glucose 70 - 99 mg/dL 89 95 87  BUN 6 - 20 mg/dL 7 7 8   Creatinine 0.44 - 1.00 mg/dL 0.80 0.69 0.92  Sodium 135 - 145 mmol/L 136 136 140  Potassium 3.5 - 5.1 mmol/L 3.7 3.9 3.4(L)  Chloride 98 - 111 mmol/L 102 103 100  CO2 22 - 32 mmol/L 27 26 29   Calcium 8.9 - 10.3 mg/dL 8.3(L) 8.3(L) 8.9  Total Protein 6.5 - 8.1 g/dL 6.6 6.4(L) 8.3(H)  Total Bilirubin 0.3 - 1.2 mg/dL 0.5 0.3 0.5  Alkaline Phos 38 - 126 U/L 80 85 105  AST 15 - 41 U/L 17 16 22   ALT 0 - 44 U/L 20 24 33     Radiology Studies: ECHO TEE  Result Date: 06/28/2021    TRANSESOPHOGEAL ECHO REPORT   Patient Name:   Mary Chambers Date of Exam: 06/28/2021 Medical Rec #:  [id number]                 Height:       67.0 in Accession #:    [id number]                Weight:       266.0 lb Date of Birth:  Mar 22, 1971                 BSA:          2.283 m Patient Age:    50 years                  BP:           120/67 mmHg Patient Gender: F                         HR:           104 bpm. Exam Location:  Inpatient Procedure: Cardiac Doppler, Color Doppler and Transesophageal Echo Indications:     Bacteremia  History:         Patient has prior history of Echocardiogram examinations, most                  recent 06/26/2021.  Sonographer:     Merrie Roof RDCS Referring Phys:  [id number] Abigail Butts Diagnosing Phys: Kirk Ruths MD PROCEDURE: After discussion of the risks and benefits of a TEE, an informed consent was obtained from the patient. The transesophogeal probe was passed without difficulty through the esophogus of the patient. Local oropharyngeal anesthetic was provided with Cetacaine. Sedation performed by different  physician. The patient was monitored while under deep sedation. The patient's vital signs; including heart rate, blood pressure, and oxygen  saturation; remained stable throughout the procedure. The patient developed no complications during the procedure. IMPRESSIONS  1. Bicuspid aortic valve (left and right cusps appear to be fused); probable vegetation noted; severe AI.  2. Left ventricular ejection fraction, by estimation, is 60 to 65%. The left ventricle has normal function. The left ventricle has no regional wall motion abnormalities.  3. Right ventricular systolic function is normal. The right ventricular size is normal.  4. No left atrial/left atrial appendage thrombus was detected.  5. The mitral valve is normal in structure. No evidence of mitral valve regurgitation.  6. The aortic valve is bicuspid. Aortic valve regurgitation is severe. FINDINGS  Left Ventricle: Left ventricular ejection fraction, by estimation, is 60 to 65%. The left ventricle has normal function. The left ventricle has no regional wall motion abnormalities. The left ventricular internal cavity size was normal in size. Right Ventricle: The right ventricular size is normal. Right ventricular systolic function is normal. Left Atrium: Left atrial size was normal in size. No left atrial/left atrial appendage thrombus was detected. Right Atrium: Right atrial size was normal in size. Pericardium: There is no evidence of pericardial effusion. Mitral Valve: The mitral valve is normal in structure. No evidence of mitral valve regurgitation. Tricuspid Valve: The tricuspid valve is normal in structure. Tricuspid valve regurgitation is trivial. Aortic Valve: The aortic valve is bicuspid. Aortic valve regurgitation is severe. Pulmonic Valve: The pulmonic valve was normal in structure. Pulmonic valve regurgitation is trivial. Aorta: The aortic root is normal in size and structure. There is minimal (Grade I) plaque involving the descending aorta.  IAS/Shunts: No atrial level shunt detected by color flow Doppler. Additional Comments: Bicuspid aortic valve (left and right cusps appear to be fused); probable vegetation noted; severe AI. Kirk Ruths MD Electronically signed by Kirk Ruths MD Signature Date/Time: 06/28/2021/3:42:19 PM    Final      Scheduled Meds:  vitamin C  500 mg Oral Daily   [START ON 07/01/2021] atorvastatin  10 mg Oral Daily   enalapril  10 mg Oral Daily   enoxaparin (LOVENOX) injection  40 mg Subcutaneous Q24H   furosemide  20 mg Oral Daily   lidocaine  1 patch Transdermal Q24H   nirmatrelvir/ritonavir EUA  3 tablet Oral BID   pantoprazole  40 mg Oral Daily   sodium chloride flush  3 mL Intravenous Q12H   traZODone  50 mg Oral QHS   zinc sulfate  220 mg Oral Daily   Continuous Infusions:  ampicillin (OMNIPEN) IV 2 g (06/29/21 1144)   cefTRIAXone (ROCEPHIN)  IV 2 g (06/29/21 1041)     LOS: 6 days   Time spent: 65  Nita Sells, MD [facility] To contact the attending provider between 7A-7P or the covering provider during after hours 7P-7A, please log into the web site [email or web address] and access using universal [place] password for that web site. If you do not have the password, please call the hospital operator.  06/29/2021, 1:16 PM

## 2021-06-29 NOTE — Consult Note (Addendum)
[address]       [place] [address]             [phone]        Elyshia M Eckert [facility] Medical Record #[id number] Date of Birth: 1971/01/21  Referring: Dr. Berthel Bagnall [facility] Primary Care: [name], NP Primary Cardiologist:None  Chief Complaint:    Chief Complaint  Patient presents with   Back Pain   Fever   Emesis    History of Present Illness:      Ms. Stann Mainland is a 50 yo morbidly obese female with history of Anxiety/Depression, Headache, Chronic back pain with multiple back surgeries, Arthritis, and kidney stones.  She presented to the ED on several occasions dating back to 9/17 with complaints of right flank and back pain with associated nausea, and hematuria.  Initial workup was felt to be consistent with Pyelonephritis.  She was given a course of antibiotics and discharged home.  She again presented to the ED at [facility] on 06/22/2021 at which time she was complaining of continued lower back and flank pain which was increasing.  The pain was now radiating down her right thigh.  She also developed complaints of urinary incontinence.  She also had fever,nausea, vomiting, and chills.  In the ED there was concern for spinal infection and they felt urgent MRI would be indicated.  She was transferred to [facility] for further workup.  MRI revealed a para-spinous soft tissue infection with abscess.  Neurosurgery was consulted and did not feel surgery would be indicated.  They recommended antibiotic treatment and possible placement of IR drain.  She was admitted to the medicine service for further care and workup.  She was noted to be COVID positive on admission and admitted to experiencing a cough for several days.  IR consult was obtained and they performed drainage which removed 4 cc of purulent fluid which was sent for culture.  This grew Enterococcus faecalis and ID consult was requested.  They recommended Ampicillin for  treatment but also suggested workup for Endocarditis.  They also started Ceftriaxone incase endocarditis was present.  TTE was obtained and overall was a poor study.  TEE was set up and performed on 06/28/2021.  This showed preserved EF and severe Aortic Insufficiency with a bicuspid Aortic Valve with fusion of left and right cusps.  There was also evidence of a probable vegetation.  Patient continues to feel rough.  Back pain persists.  She is upset and tearful.    Current Activity/ Functional Status: Patient was independent with mobility/ambulation, transfers, ADL's, IADL's.   Zubrod Score: At the time of surgery this patient's most appropriate activity status/level should be described as: []     0    Normal activity, no symptoms []     1    Restricted in physical strenuous activity but ambulatory, able to do out light work [x]     2    Ambulatory and capable of self care, unable to do work activities, up and about                 more than 50%  Of the time                            []     3    Only limited self care, in bed greater than 50% of waking hours []     4  Completely disabled, no self care, confined to bed or chair []     5    Moribund  Past Medical History:  Diagnosis Date   Arthritis    Depression    Hypertension    Kidney stone    Obesity     Past Surgical History:  Procedure Laterality Date   ABDOMINAL HYSTERECTOMY     BACK SURGERY     CARPAL TUNNEL RELEASE     CHOLECYSTECTOMY     IR FLUORO GUIDED NEEDLE PLC ASPIRATION/INJECTION LOC  06/23/2021   LITHOTRIPSY     neck fusion     TUBAL LIGATION      Social History   Tobacco Use  Smoking Status Never  Smokeless Tobacco Never    Social History   Substance and Sexual Activity  Alcohol Use No     Allergies  Allergen Reactions   Baclofen Hives   Doxycycline Itching   Percocet [Oxycodone-Acetaminophen] Hives   Septra [Sulfamethoxazole-Trimethoprim] Hives   Gabapentin Rash    Current Facility-Administered  Medications  Medication Dose Route Frequency Provider Last Rate Last Admin   acetaminophen (TYLENOL) tablet 650 mg  650 mg Oral Q6H PRN Lelon Perla, MD   650 mg at 06/23/21 3976   Or   acetaminophen (TYLENOL) suppository 650 mg  650 mg Rectal Q6H PRN Lelon Perla, MD       albuterol (VENTOLIN HFA) 108 (90 Base) MCG/ACT inhaler 2 puff  2 puff Inhalation Q6H PRN Lelon Perla, MD       ampicillin (OMNIPEN) 2 g in sodium chloride 0.9 % 100 mL IVPB  2 g Intravenous Q4H Lelon Perla, MD 300 mL/hr at 06/29/21 1144 2 g at 06/29/21 1144   ascorbic acid (VITAMIN C) tablet 500 mg  500 mg Oral Daily Lelon Perla, MD   500 mg at 06/29/21 1042   [START ON 07/01/2021] atorvastatin (LIPITOR) tablet 10 mg  10 mg Oral Daily Lelon Perla, MD       cefTRIAXone (ROCEPHIN) 2 g in sodium chloride 0.9 % 100 mL IVPB  2 g Intravenous Q12H Lelon Perla, MD 200 mL/hr at 06/29/21 1041 2 g at 06/29/21 1041   diclofenac Sodium (VOLTAREN) 1 % topical gel 2 g  2 g Topical QID PRN Lelon Perla, MD       enalapril (VASOTEC) tablet 10 mg  10 mg Oral Daily Lelon Perla, MD   10 mg at 06/29/21 1042   enoxaparin (LOVENOX) injection 40 mg  40 mg Subcutaneous Q24H Lelon Perla, MD   40 mg at 06/28/21 1806   fentaNYL (SUBLIMAZE) injection 25 mcg  25 mcg Intravenous Q3H PRN Lelon Perla, MD       furosemide (LASIX) tablet 20 mg  20 mg Oral Daily Lelon Perla, MD   20 mg at 06/29/21 1042   guaiFENesin-dextromethorphan (ROBITUSSIN DM) 100-10 MG/5ML syrup 10 mL  10 mL Oral Q4H PRN Lelon Perla, MD       HYDROcodone-acetaminophen (NORCO/VICODIN) 5-325 MG per tablet 1 tablet  1 tablet Oral Q4H PRN Lelon Perla, MD       Or   HYDROcodone-acetaminophen (NORCO) 10-325 MG per tablet 1 tablet  1 tablet Oral Q4H PRN Lelon Perla, MD   1 tablet at 06/28/21 2148   lidocaine (LIDODERM) 5 % 1 patch  1 patch Transdermal Q24H Lelon Perla, MD   1 patch at 06/28/21 1328    nirmatrelvir/ritonavir EUA (PAXLOVID) 3  tablet  3 tablet Oral BID Lelon Perla, MD   3 tablet at 06/28/21 2148   ondansetron [facility]) tablet 4 mg  4 mg Oral Q6H PRN Lelon Perla, MD   4 mg at 06/27/21 2126   Or   ondansetron (ZOFRAN) injection 4 mg  4 mg Intravenous Q6H PRN Lelon Perla, MD   4 mg at 06/26/21 2205   pantoprazole (PROTONIX) EC tablet 40 mg  40 mg Oral Daily Lelon Perla, MD   40 mg at 06/29/21 1043   sodium chloride flush (NS) 0.9 % injection 3 mL  3 mL Intravenous Q12H Lelon Perla, MD   3 mL at 06/29/21 1044   traZODone (DESYREL) tablet 50 mg  50 mg Oral QHS Lelon Perla, MD   50 mg at 06/28/21 2148   zinc sulfate capsule 220 mg  220 mg Oral Daily Lelon Perla, MD   220 mg at 06/29/21 1042    Facility-Administered Medications Prior to Admission  Medication Dose Route Frequency Provider Last Rate Last Admin   0.9 %  sodium chloride infusion  500 mL Intravenous Once Armbruster, Carlota Raspberry, MD       Medications Prior to Admission  Medication Sig Dispense Refill Last Dose   Ascorbic Acid (VITAMIN C) 500 MG CAPS Take 500 mg by mouth daily.   Past Week   atorvastatin (LIPITOR) 10 MG tablet Take 10 mg by mouth daily.   Past Week   cholecalciferol (VITAMIN D3) 25 MCG (1000 UT) tablet Take 1,000 Units by mouth daily.   Past Week   enalapril (VASOTEC) 10 MG tablet Take 10 mg by mouth daily.   Past Week   fluticasone (FLONASE) 50 MCG/ACT nasal spray Place 2 sprays into both nostrils daily. (Patient taking differently: Place 2 sprays into both nostrils daily as needed for allergies.) 16 g 0 Past Month   furosemide (LASIX) 20 MG tablet Take 20 mg by mouth daily.   Past Week   hydrochlorothiazide (HYDRODIURIL) 25 MG tablet Take 25 mg by mouth daily.   Past Week   LOMAIRA 8 MG TABS Take 8 mg by mouth daily.   Past Week   Multiple Vitamin (MULTIVITAMIN) capsule Take 1 capsule by mouth daily.    Past Week   naproxen (NAPROSYN) 250 MG tablet Take 250 mg by  mouth 2 (two) times daily.   Past Week   pantoprazole (PROTONIX) 40 MG tablet Take 40 mg by mouth daily.   Past Week   POTASSIUM PO Take 1,000 mg by mouth daily.   Past Week   tiZANidine (ZANAFLEX) 4 MG tablet Take 4 mg by mouth 2 (two) times daily as needed (for back pain/sleep).   Past Week   traMADol (ULTRAM) 50 MG tablet Take 50 mg by mouth every 12 (twelve) hours as needed for moderate pain.   Past Week   benzonatate (TESSALON) 100 MG capsule Take 1 capsule (100 mg total) by mouth 3 (three) times daily as needed. (Patient not taking: Reported on 06/23/2021) 30 capsule 0 Not Taking   cephALEXin (KEFLEX) 500 MG capsule Take 1 capsule (500 mg total) by mouth 3 (three) times daily for 10 days. (Patient not taking: Reported on 06/23/2021) 30 capsule 0 Not Taking   naproxen (NAPROSYN) 500 MG tablet Take 1 tablet (500 mg total) by mouth 2 (two) times daily with a meal. (Patient not taking: Reported on 06/23/2021) 20 tablet 0 Not Taking    Family History  Problem Relation Age  of Onset   Diabetes Mother    Hypertension Mother    Cancer Father    Parkinson's disease Father    Heart disease Father    Heart disease Sister    Heart disease Brother    Colon cancer Neg Hx    Rectal cancer Neg Hx    Stomach cancer Neg Hx      Review of Systems:   ROS    Cardiac Review of Systems: Y or  [    ]= no  Chest Pain [  N  ]  Resting SOB [  N ] Exertional SOB  [  ]  Orthopnea [  ]   Pedal Edema [   ]    Palpitations [ N ] Syncope  [  ]   Presyncope [   ]  General Review of Systems: [Y] = yes [  ]=no Constitional: recent weight change [  ]; anorexia [  ]; fatigue [  ]; nausea [  Y]; night sweats [  ]; fever [ Y ]; or chills [ Y ]                                                               Dental: Last Dentist visit:   Eye : blurred vision [  ]; diplopia [   ]; vision changes [  ];  Amaurosis fugax[  ]; Resp: cough [  ];  wheezing[  ];  hemoptysis[ N ]; shortness of breath[  ]; paroxysmal nocturnal  dyspnea[  ]; dyspnea on exertion[  ]; or orthopnea[  ];  GI:  gallstones[  ], vomiting[ Y ];  dysphagia[  ]; melena[  ];  hematochezia [  ]; heartburn[  ];   Hx of  Colonoscopy[  ]; GU: kidney stones [H/O  ]; hematuria[ U ];   dysuria [  ];  nocturia[  ];  history of     obstruction [  ]; urinary frequency/incontinence [ Y ]             Skin: rash, swelling[  ];, hair loss[  ];  peripheral edema[ Y ];  or itching[  ]; Musculosketetal: myalgias[  ];  joint swelling[  ];  joint erythema[  ];  joint pain[  ];  back pain[ Y ];  Heme/Lymph: bruising[  ];  bleeding[  ];  anemia[  ];  Neuro: TIA[  ];  headaches[  ];  stroke[  ];  vertigo[  ];  seizures[  ];   paresthesias[  ];  difficulty walking[  ];  Psych:depression[ Y ]; anxiety[ Y ];  Endocrine: diabetes[N  ];  thyroid dysfunction[N  ];  Physical Exam: BP (!) 119/59 (BP Location: Left Arm)   Pulse 95   Temp 98.3 F (36.8 C) (Oral)   Resp 17   Ht 5\' 7"  (1.702 m)   Wt 120.7 kg   SpO2 94%   BMI 41.66 kg/m    General appearance: alert, cooperative, and no distress Head: Normocephalic, without obvious abnormality, atraumatic Resp: clear to auscultation bilaterally Cardio: regular rate and rhythm GI: soft, non-tender; bowel sounds normal; no masses,  no organomegaly Extremities: extremities normal, atraumatic, no cyanosis or edema Neurologic: Grossly normal  Diagnostic Studies & Laboratory data:     Recent Radiology Findings:  ECHO TEE  Result Date: 06/28/2021    TRANSESOPHOGEAL ECHO REPORT   Patient Name:   TIANAH LONARDO Date of Exam: 06/28/2021 Medical Rec #:  [id number]                 Height:       67.0 in Accession #:    [id number]                Weight:       266.0 lb Date of Birth:  1971/09/30                 BSA:          2.283 m Patient Age:    77 years                  BP:           120/67 mmHg Patient Gender: F                         HR:           104 bpm. Exam Location:  Inpatient Procedure: Cardiac Doppler, Color  Doppler and Transesophageal Echo Indications:     Bacteremia  History:         Patient has prior history of Echocardiogram examinations, most                  recent 06/26/2021.  Sonographer:     Merrie Roof RDCS Referring Phys:  [id number] Abigail Butts Diagnosing Phys: Kirk Ruths MD PROCEDURE: After discussion of the risks and benefits of a TEE, an informed consent was obtained from the patient. The transesophogeal probe was passed without difficulty through the esophogus of the patient. Local oropharyngeal anesthetic was provided with Cetacaine. Sedation performed by different physician. The patient was monitored while under deep sedation. The patient's vital signs; including heart rate, blood pressure, and oxygen saturation; remained stable throughout the procedure. The patient developed no complications during the procedure. IMPRESSIONS  1. Bicuspid aortic valve (left and right cusps appear to be fused); probable vegetation noted; severe AI.  2. Left ventricular ejection fraction, by estimation, is 60 to 65%. The left ventricle has normal function. The left ventricle has no regional wall motion abnormalities.  3. Right ventricular systolic function is normal. The right ventricular size is normal.  4. No left atrial/left atrial appendage thrombus was detected.  5. The mitral valve is normal in structure. No evidence of mitral valve regurgitation.  6. The aortic valve is bicuspid. Aortic valve regurgitation is severe. FINDINGS  Left Ventricle: Left ventricular ejection fraction, by estimation, is 60 to 65%. The left ventricle has normal function. The left ventricle has no regional wall motion abnormalities. The left ventricular internal cavity size was normal in size. Right Ventricle: The right ventricular size is normal. Right ventricular systolic function is normal. Left Atrium: Left atrial size was normal in size. No left atrial/left atrial appendage thrombus was detected. Right Atrium: Right atrial size  was normal in size. Pericardium: There is no evidence of pericardial effusion. Mitral Valve: The mitral valve is normal in structure. No evidence of mitral valve regurgitation. Tricuspid Valve: The tricuspid valve is normal in structure. Tricuspid valve regurgitation is trivial. Aortic Valve: The aortic valve is bicuspid. Aortic valve regurgitation is severe. Pulmonic Valve: The pulmonic valve was normal in structure. Pulmonic valve regurgitation is trivial. Aorta: The aortic root is normal in size and structure.  There is minimal (Grade I) plaque involving the descending aorta. IAS/Shunts: No atrial level shunt detected by color flow Doppler. Additional Comments: Bicuspid aortic valve (left and right cusps appear to be fused); probable vegetation noted; severe AI. Kirk Ruths MD Electronically signed by Kirk Ruths MD Signature Date/Time: 06/28/2021/3:42:19 PM    Final      I have independently reviewed the above radiologic studies and discussed with the patient   Recent Lab Findings: Lab Results  Component Value Date   WBC 8.0 06/29/2021   HGB 10.9 (L) 06/29/2021   HCT 34.6 (L) 06/29/2021   PLT 392 06/29/2021   GLUCOSE 89 06/29/2021   ALT 20 06/29/2021   AST 17 06/29/2021   NA 136 06/29/2021   K 3.7 06/29/2021   CL 102 06/29/2021   CREATININE 0.80 06/29/2021   BUN 7 06/29/2021   CO2 27 06/29/2021      Assessment / Plan:      Aortic Insufficiency with Bicuspid Aortic Valve with Vegetations consistent with Endocarditis Spinal Abscess  - + for Enterococcus Faecailis... on ABX per ID HTN Chronic Back Pain COVID-19  Plan:  Patient with acute COVID 19 Infection, Spinal Abscess, and AV Endocarditis with Aortic Insufficiency.  There is no indication for urgent surgery at this time.  Patient will need to have catheterization in future as well as dental clearance.  She should completed her course of IV ABX.  Once complete TEE can be repeated to assess Aortic Valve, if AI remains severe  she can follow up in our office for surgical evaluation for possible Aortic Valve Replacement... Dr. Kipp Brood is aware of patient  I  spent 55 minutes counseling the patient face to face.   Ellwood Handler, PA-C 06/29/2021 12:04 PM    Agree with above.   Due to the spinal abscess and lack of source control, aortic valve replacement at this time would put her a very high risk of prosthetic valve endocarditis.  Additionally, she is covid positive.  Hemodynamically she is stable, thus we will plan for AVR after her spinal abscess has been treated.  Arcelia Pals Bary Leriche

## 2021-06-29 NOTE — Progress Notes (Addendum)
Subjective: Patient reports that she is continuing to feel "rough". She is tearful about her recent diagnosis of AV endocarditis.   Objective: Vital signs in last 24 hours: Temp:  [98.2 F (36.8 C)-99.3 F (37.4 C)] 98.4 F (36.9 C) (09/28 0447) Pulse Rate:  [87-99] 98 (09/28 0447) Resp:  [16-17] 17 (09/27 1500) BP: (93-124)/(55-72) 93/67 (09/28 0447) SpO2:  [96 %-100 %] 99 % (09/28 0447)  Intake/Output from previous day: 09/27 0701 - 09/28 0700 In: 100 [I.V.:100] Out: 2050 [Urine:2050] Intake/Output this shift: No intake/output data recorded.  Physical Exam: Patient is awake, A/O X 4, and conversant. She is laying comfortably in bed in NAD. VSS. Speech is fluent and appropriate. MAEW with good strength. Sensation to light touch is intact. PERLA, EOMI. CNs grossly intact.  Lab Results: Recent Labs    06/28/21 0347 06/29/21 0403  WBC 8.9 8.0  HGB 11.0* 10.9*  HCT 34.4* 34.6*  PLT 371 392   BMET Recent Labs    06/28/21 0347 06/29/21 0403  NA 136 136  K 3.9 3.7  CL 103 102  CO2 26 27  GLUCOSE 95 89  BUN 7 7  CREATININE 0.69 0.80  CALCIUM 8.3* 8.3*    Studies/Results: ECHO TEE  Result Date: 06/28/2021    TRANSESOPHOGEAL ECHO REPORT   Patient Name:   Mary Chambers Date of Exam: 06/28/2021 Medical Rec #:  [id number]                 Height:       67.0 in Accession #:    [id number]                Weight:       266.0 lb Date of Birth:  [birth year]                 BSA:          2.283 m Patient Age:    30 years                  BP:           120/67 mmHg Patient Gender: F                         HR:           104 bpm. Exam Location:  Inpatient Procedure: Cardiac Doppler, Color Doppler and Transesophageal Echo Indications:     Bacteremia  History:         Patient has prior history of Echocardiogram examinations, most                  recent 06/26/2021.  Sonographer:     Merrie Roof RDCS Referring Phys:  [id number] Abigail Butts Diagnosing Phys: Kirk Ruths MD  PROCEDURE: After discussion of the risks and benefits of a TEE, an informed consent was obtained from the patient. The transesophogeal probe was passed without difficulty through the esophogus of the patient. Local oropharyngeal anesthetic was provided with Cetacaine. Sedation performed by different physician. The patient was monitored while under deep sedation. The patient's vital signs; including heart rate, blood pressure, and oxygen saturation; remained stable throughout the procedure. The patient developed no complications during the procedure. IMPRESSIONS  1. Bicuspid aortic valve (left and right cusps appear to be fused); probable vegetation noted; severe AI.  2. Left ventricular ejection fraction, by estimation, is 60 to 65%. The left ventricle has normal function. The left ventricle  has no regional wall motion abnormalities.  3. Right ventricular systolic function is normal. The right ventricular size is normal.  4. No left atrial/left atrial appendage thrombus was detected.  5. The mitral valve is normal in structure. No evidence of mitral valve regurgitation.  6. The aortic valve is bicuspid. Aortic valve regurgitation is severe. FINDINGS  Left Ventricle: Left ventricular ejection fraction, by estimation, is 60 to 65%. The left ventricle has normal function. The left ventricle has no regional wall motion abnormalities. The left ventricular internal cavity size was normal in size. Right Ventricle: The right ventricular size is normal. Right ventricular systolic function is normal. Left Atrium: Left atrial size was normal in size. No left atrial/left atrial appendage thrombus was detected. Right Atrium: Right atrial size was normal in size. Pericardium: There is no evidence of pericardial effusion. Mitral Valve: The mitral valve is normal in structure. No evidence of mitral valve regurgitation. Tricuspid Valve: The tricuspid valve is normal in structure. Tricuspid valve regurgitation is trivial. Aortic  Valve: The aortic valve is bicuspid. Aortic valve regurgitation is severe. Pulmonic Valve: The pulmonic valve was normal in structure. Pulmonic valve regurgitation is trivial. Aorta: The aortic root is normal in size and structure. There is minimal (Grade I) plaque involving the descending aorta. IAS/Shunts: No atrial level shunt detected by color flow Doppler. Additional Comments: Bicuspid aortic valve (left and right cusps appear to be fused); probable vegetation noted; severe AI. Kirk Ruths MD Electronically signed by Kirk Ruths MD Signature Date/Time: 06/28/2021/3:42:19 PM    Final     Assessment/Plan: 50 y.o. female with acute inflammatory changes in the right paraspinous tissue of the lumbar spine with L2-L5 septic arthritis. She continues to be at her neurological baseline. TEE performed yesterday revealed severe AI, bicuspid Aortic valve with vegetation.  Patient will require valve replacement.  Prior thoracic spinal surgery should not affect surgery for heart valve.  Her pain is slowly improving. Continue abx per ID. No new neurosurgical recommendations at this time. Call with any questions.   LOS: 6 days     Marvis Moeller, DNP, NP-C 06/29/2021, 9:39 AM

## 2021-06-29 NOTE — Progress Notes (Signed)
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   Day 8 total antibiotics  Physical Exam: Constitutional:  Vitals:   06/29/21 0447 06/29/21 0833  BP: 93/67 (!) 119/59  Pulse: 98 95  Resp:  17  Temp: 98.4 F (36.9 C) 98.3 F (36.8 C)  SpO2: 99% 94%   patient appears in NAD Respiratory: Normal respiratory effort; CTA B Cardiovascular: RRR GI: soft, nt, nd  Review of Systems: Constitutional: negative for fevers and chills Gastrointestinal: negative for nausea and diarrhea Integument/breast: negative for rash  Lab Results  Component Value Date   WBC 8.0 06/29/2021   HGB 10.9 (L) 06/29/2021   HCT 34.6 (L) 06/29/2021   MCV 85.0 06/29/2021   PLT 392 06/29/2021    Lab Results  Component Value Date   CREATININE 0.80 06/29/2021   BUN 7 06/29/2021   NA 136 06/29/2021   K 3.7 06/29/2021   CL 102 06/29/2021   CO2 27 06/29/2021    Lab Results  Component Value Date   ALT 20 06/29/2021   AST 17 06/29/2021   ALKPHOS 80 06/29/2021     Microbiology: Recent Results (from the past 240 hour(s))  Resp Panel by RT-PCR (Flu A&B, Covid) Nasopharyngeal Swab     Status: Abnormal   Collection Time: 06/22/21 10:57 PM   Specimen: Nasopharyngeal Swab; Nasopharyngeal(NP) swabs in vial transport medium  Result Value Ref Range Status   SARS Coronavirus 2 by RT PCR POSITIVE (A) NEGATIVE Final    Comment: RESULT CALLED TO, READ BACK BY AND VERIFIED WITH: Burgess Amor RN AT 0006 06/23/21 CH (NOTE) SARS-CoV-2 target nucleic acids are DETECTED.  The SARS-CoV-2 RNA is generally detectable in upper respiratory specimens during the acute phase of infection. Positive results are indicative of the presence of the identified virus, but do not rule out bacterial infection or co-infection with other pathogens not detected by the test. Clinical  correlation with patient history and other diagnostic information is necessary to determine patient infection status. The expected result is Negative.  Fact Sheet for Patients: EntrepreneurPulse.com.au  Fact Sheet for Healthcare Providers: IncredibleEmployment.be  This test is not yet approved or cleared by the [place] FDA and  has been authorized for detection and/or diagnosis of SARS-CoV-2 by FDA under an Emergency Use Authorization (EUA).  This EUA will remain in effect (meaning this test can be  used) for the duration of  the COVID-19 declaration under Section 564(b)(1) of the Act, 21 U.S.C. section 360bbb-3(b)(1), unless the authorization is terminated or revoked sooner.     Influenza A by PCR NEGATIVE NEGATIVE Final   Influenza B by PCR NEGATIVE NEGATIVE Final    Comment: (NOTE) The Xpert Xpress SARS-CoV-2/FLU/RSV plus assay is intended as an aid in the diagnosis of influenza from Nasopharyngeal swab specimens and should not be used as a sole basis for treatment. Nasal washings and aspirates are unacceptable for Xpert Xpress SARS-CoV-2/FLU/RSV testing.  Fact Sheet for Patients: EntrepreneurPulse.com.au  Fact Sheet for Healthcare Providers: IncredibleEmployment.be  This test is not yet approved or cleared by the [place] FDA and has been authorized for detection and/or diagnosis of SARS-CoV-2 by FDA under an Emergency Use Authorization (EUA). This EUA will remain in effect (meaning this test can be used) for the duration of the COVID-19 declaration under Section 564(b)(1) of the Act, 21 U.S.C. section  360bbb-3(b)(1), unless the authorization is terminated or revoked.  Performed at [facility], [address] [place], [place] [address]   Blood culture (routine x 2)     Status: Abnormal   Collection Time: 06/22/21 11:22 PM   Specimen: BLOOD  Result Value Ref Range  Status   Specimen Description   Final    BLOOD BOTTLES DRAWN AEROBIC AND ANAEROBIC Performed at [facility], [address] [place], [place] [address]    Special Requests   Final    Blood Culture adequate volume RIGHT ANTECUBITAL Performed at [facility], [place], [place] [address]    Culture  Setup Time   Final    GRAM POSITIVE COCCI IN BOTH AEROBIC AND ANAEROBIC BOTTLES CRITICAL RESULT CALLED TO, READ BACK BY AND VERIFIED WITH: PHARMD Stroud PHAM 06/23/21@23 :25 BY TW Performed at [facility], [place] [address] [place], [place] [address]    Culture ENTEROCOCCUS FAECALIS (A)  Final   Report Status 06/26/2021 FINAL  Final   Organism ID, Bacteria ENTEROCOCCUS FAECALIS  Final      Susceptibility   Enterococcus faecalis - MIC*    AMPICILLIN <=2 SENSITIVE Sensitive     VANCOMYCIN 1 SENSITIVE Sensitive     GENTAMICIN SYNERGY SENSITIVE Sensitive     * ENTEROCOCCUS FAECALIS  Blood Culture ID Panel (Reflexed)     Status: Abnormal   Collection Time: 06/22/21 11:22 PM  Result Value Ref Range Status   Enterococcus faecalis DETECTED (A) NOT DETECTED Final    Comment: CRITICAL RESULT CALLED TO, READ BACK BY AND VERIFIED WITH: PHARMD Seguin PHAM 06/23/21@23 :25 BY TW    Enterococcus Faecium NOT DETECTED NOT DETECTED Final   Listeria monocytogenes NOT DETECTED NOT DETECTED Final   Staphylococcus species NOT DETECTED NOT DETECTED Final   Staphylococcus aureus (BCID) NOT DETECTED NOT DETECTED Final   Staphylococcus epidermidis NOT DETECTED NOT DETECTED Final   Staphylococcus lugdunensis NOT DETECTED NOT DETECTED Final   Streptococcus species NOT DETECTED NOT DETECTED Final   Streptococcus agalactiae NOT DETECTED NOT DETECTED Final   Streptococcus pneumoniae NOT DETECTED NOT DETECTED Final   Streptococcus pyogenes NOT DETECTED NOT DETECTED Final   A.calcoaceticus-baumannii NOT DETECTED NOT DETECTED Final   Bacteroides fragilis NOT  DETECTED NOT DETECTED Final   Enterobacterales NOT DETECTED NOT DETECTED Final   Enterobacter cloacae complex NOT DETECTED NOT DETECTED Final   Escherichia coli NOT DETECTED NOT DETECTED Final   Klebsiella aerogenes NOT DETECTED NOT DETECTED Final   Klebsiella oxytoca NOT DETECTED NOT DETECTED Final   Klebsiella pneumoniae NOT DETECTED NOT DETECTED Final   Proteus species NOT DETECTED NOT DETECTED Final   Salmonella species NOT DETECTED NOT DETECTED Final   Serratia marcescens NOT DETECTED NOT DETECTED Final   Haemophilus influenzae NOT DETECTED NOT DETECTED Final   Neisseria meningitidis NOT DETECTED NOT DETECTED Final   Pseudomonas aeruginosa NOT DETECTED NOT DETECTED Final   Stenotrophomonas maltophilia NOT DETECTED NOT DETECTED Final   Candida albicans NOT DETECTED NOT DETECTED Final   Candida auris NOT DETECTED NOT DETECTED Final   Candida glabrata NOT DETECTED NOT DETECTED Final   Candida krusei NOT DETECTED NOT DETECTED Final   Candida parapsilosis NOT DETECTED NOT DETECTED Final   Candida tropicalis NOT DETECTED NOT DETECTED Final   Cryptococcus neoformans/gattii NOT DETECTED NOT DETECTED Final   Vancomycin resistance NOT DETECTED NOT DETECTED Final    Comment: Performed at [facility], [place] [address] [place], [place] [address]  Blood  culture (routine x 2)     Status: Abnormal   Collection Time: 06/22/21 11:35 PM   Specimen: BLOOD LEFT WRIST  Result Value Ref Range Status   Specimen Description BLOOD LEFT WRIST  Final   Special Requests   Final    BOTTLES DRAWN AEROBIC AND ANAEROBIC Blood Culture adequate volume   Culture  Setup Time   Final    GRAM POSITIVE COCCI IN BOTH AEROBIC AND ANAEROBIC BOTTLES CRITICAL VALUE NOTED.  VALUE IS CONSISTENT WITH PREVIOUSLY REPORTED AND CALLED VALUE.    Culture (A)  Final    ENTEROCOCCUS FAECALIS SUSCEPTIBILITIES PERFORMED ON PREVIOUS CULTURE WITHIN THE LAST 5 DAYS. Performed at [facility], [place] [address],  [place], [place] [address]    Report Status 06/26/2021 FINAL  Final  Aerobic/Anaerobic Culture w Gram Stain (surgical/deep wound)     Status: None   Collection Time: 06/23/21  2:52 PM   Specimen: Back; Tissue  Result Value Ref Range Status   Specimen Description ABSCESS  Final   Special Requests PARASPINAL L4 L5  Final   Gram Stain   Final    MODERATE WBC PRESENT,BOTH PMN AND MONONUCLEAR NO ORGANISMS SEEN    Culture   Final    RARE ENTEROCOCCUS FAECALIS NO ANAEROBES ISOLATED Performed at [facility], [address], [place], [place] [address]    Report Status 06/28/2021 FINAL  Final   Organism ID, Bacteria ENTEROCOCCUS FAECALIS  Final      Susceptibility   Enterococcus faecalis - MIC*    AMPICILLIN <=2 SENSITIVE Sensitive     VANCOMYCIN 1 SENSITIVE Sensitive     GENTAMICIN SYNERGY SENSITIVE Sensitive     * RARE ENTEROCOCCUS FAECALIS  Culture, blood (routine x 2)     Status: None   Collection Time: 06/24/21  3:44 PM   Specimen: BLOOD  Result Value Ref Range Status   Specimen Description BLOOD LEFT ANTECUBITAL  Final   Special Requests   Final    BOTTLES DRAWN AEROBIC AND ANAEROBIC Blood Culture adequate volume   Culture   Final    NO GROWTH 5 DAYS Performed at [facility], [address], [place], [place] [address]    Report Status 06/29/2021 FINAL  Final  Culture, blood (routine x 2)     Status: None   Collection Time: 06/24/21  4:00 PM   Specimen: BLOOD LEFT HAND  Result Value Ref Range Status   Specimen Description BLOOD LEFT HAND  Final   Special Requests   Final    BOTTLES DRAWN AEROBIC ONLY Blood Culture adequate volume   Culture   Final    NO GROWTH 5 DAYS Performed at [facility], [place] [address] [place], [place] [address]    Report Status 06/29/2021 FINAL  Final    Impression/Plan:  1. 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   No surgery planned per neurosurgery and on antibiotics with ampicillin.  Will treat for a prolonged course with IV therapy.  2. 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  Will treat for 6 week total.  CT surgery has reviewed the patient and will repeat a TEE after treatment completion to determine surgery needs, if any indicated.    3.  Access - will need a picc line and I have  ordered it.  Discussed with the patient

## 2021-06-29 NOTE — Progress Notes (Signed)
OT NOTE  Ot evaluation complete and formal evaluation to follow. No OT needs upon d/c home with family support.   Fleeta Emmer, OTR/L  Acute Rehabilitation Services Pager: [phone] Office: [phone] .

## 2021-06-30 ENCOUNTER — Inpatient Hospital Stay (HOSPITAL_COMMUNITY): Payer: 59

## 2021-06-30 DIAGNOSIS — R6520 Severe sepsis without septic shock: Secondary | ICD-10-CM | POA: Diagnosis not present

## 2021-06-30 DIAGNOSIS — I330 Acute and subacute infective endocarditis: Secondary | ICD-10-CM

## 2021-06-30 DIAGNOSIS — A419 Sepsis, unspecified organism: Secondary | ICD-10-CM | POA: Diagnosis not present

## 2021-06-30 HISTORY — PX: IR FLUORO GUIDE CV LINE RIGHT: IMG2283

## 2021-06-30 IMAGING — US IR FLUORO GUIDE CV LINE*R*
2 series · 5 of 5 positions shown · non-contrast
Comparison: none

INDICATION: Patient with Enterococcus faecalis endocarditis, septic arthritis
with spinal abscess, poor venous access. Request to IR for PICC
placement for long-term IV antibiotic therapy.

[Series 1: fl (-) angio · 1 of 1 slices shown]
[im 1/1]
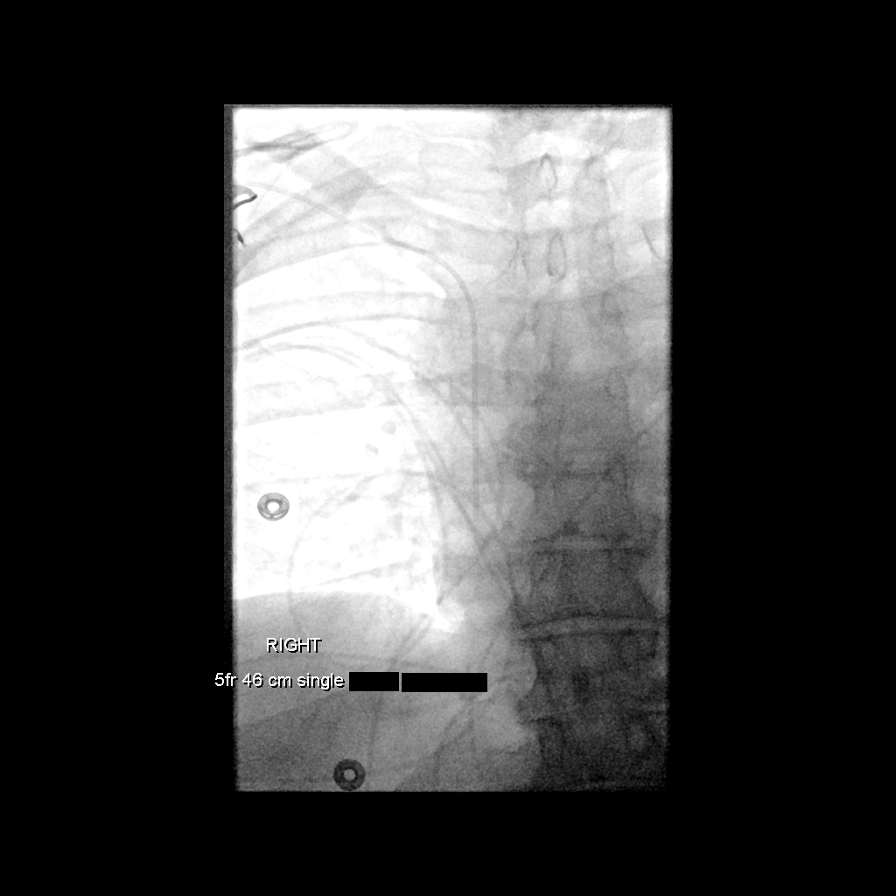

[Series 1: ir fluoro guide cv line*right* · 4 of 4 slices shown]
[im 1/4]
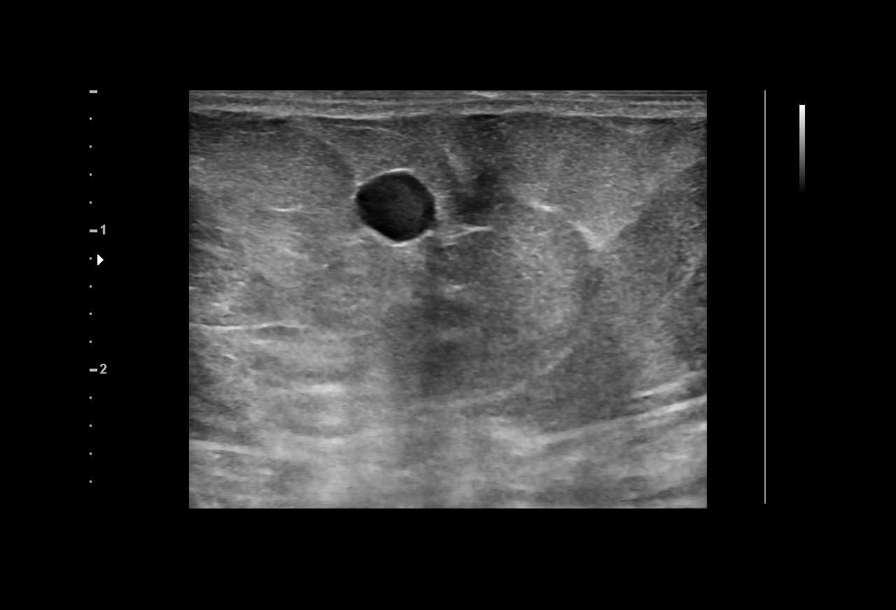
[im 2/4]
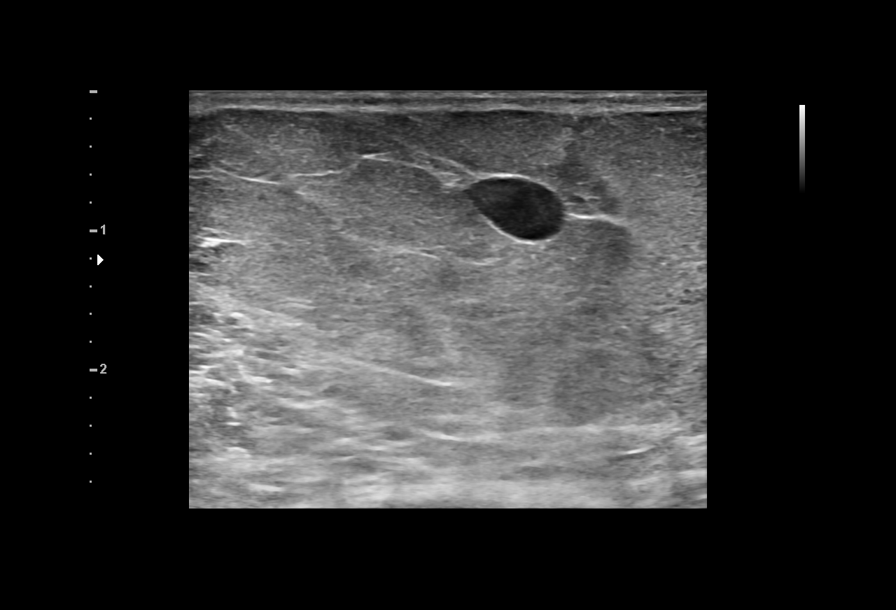
[im 3/4]
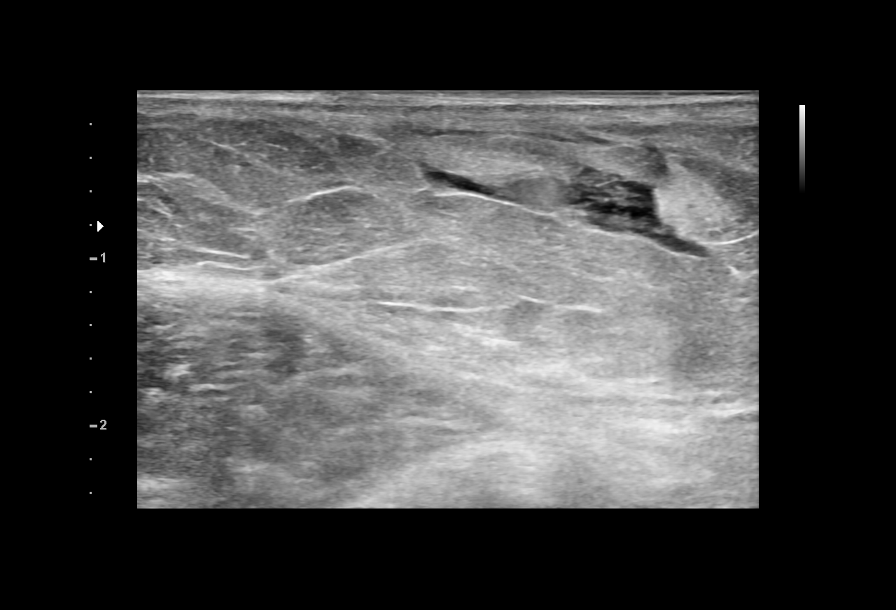
[im 4/4]
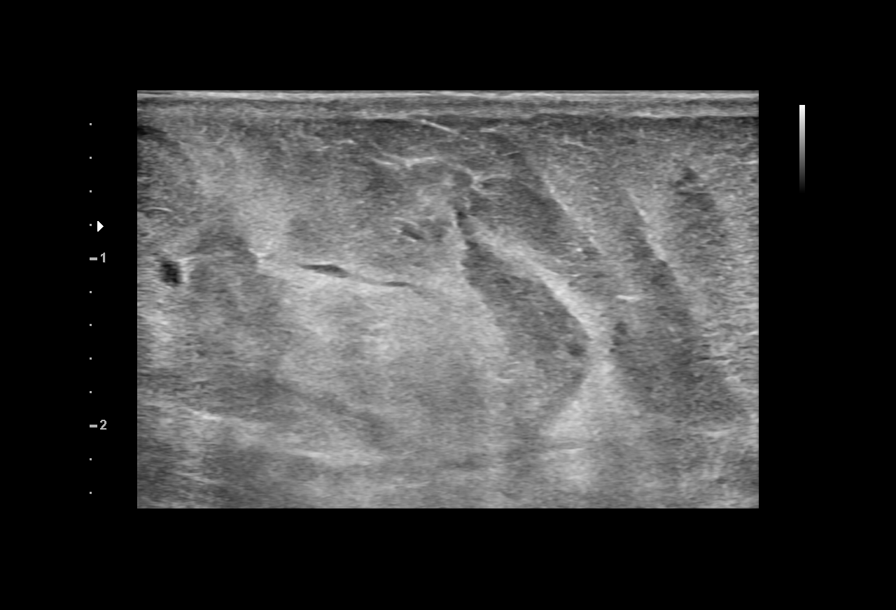

[5 of 5 positions shown; findings below may reference images not displayed]

EXAM:
RIGHT UPPER EXTREMITY PICC LINE PLACEMENT WITH ULTRASOUND AND
FLUOROSCOPIC GUIDANCE

MEDICATIONS:
4 mL 1% lidocaine

ANESTHESIA/SEDATION:
None.

FLUOROSCOPY TIME:  Fluoroscopy Time: 0 minutes 24 seconds (2.8 mGy).

COMPLICATIONS:
None immediate.

PROCEDURE:
The patient was advised of the possible risks and complications and
agreed to undergo the procedure. The patient was then brought to the
angiographic suite for the procedure.

The right arm was prepped with chlorhexidine, draped in the usual
sterile fashion using maximum barrier technique (cap and mask,
sterile gown, sterile gloves, large sterile sheet, hand hygiene and
cutaneous antisepsis) and infiltrated locally with 1% Lidocaine.

Ultrasound demonstrated patency of the right basilic vein, and this
was documented with an image. Under real-time ultrasound guidance,
this vein was accessed with a 21 gauge micropuncture needle and
image documentation was performed. A [DATE] wire was introduced in to
the vein. Over this, a 5 French single lumen power-injectable PICC
was advanced to the lower SVC/right atrial junction. Fluoroscopy
during the procedure and fluoro spot radiograph confirms appropriate
catheter position. The catheter was flushed and covered with a
sterile dressing.

Catheter length: 46 cm cm
IMPRESSION: Successful right arm Power PICC line placement with ultrasound and
fluoroscopic guidance. The catheter is ready for use.

Read by JIM

## 2021-06-30 MED ORDER — HEPARIN SOD (PORK) LOCK FLUSH 100 UNIT/ML IV SOLN
INTRAVENOUS | Status: AC
  Administered 2021-06-30: 2 [IU]
  Filled 2021-06-30: qty 5

## 2021-06-30 MED ORDER — LOPERAMIDE HCL 2 MG PO CAPS
2.0000 mg | ORAL_CAPSULE | ORAL | Status: DC | PRN
  Administered 2021-06-30 – 2021-07-01 (×3): 2 mg via ORAL
  Filled 2021-06-30 (×3): qty 1

## 2021-06-30 MED ORDER — AMPICILLIN IV (FOR PTA / DISCHARGE USE ONLY): 12.0000 g | INTRAVENOUS | 0 refills | Status: AC

## 2021-06-30 MED ORDER — LIDOCAINE HCL 1 % IJ SOLN
INTRAMUSCULAR | Status: DC | PRN
  Administered 2021-06-30: 20 mL via INTRADERMAL

## 2021-06-30 MED ORDER — LIDOCAINE HCL 1 % IJ SOLN
INTRAMUSCULAR | Status: AC
  Administered 2021-06-30: 2 mL via INTRADERMAL
  Filled 2021-06-30: qty 20

## 2021-06-30 MED ORDER — CEFTRIAXONE IV (FOR PTA / DISCHARGE USE ONLY): 2.0000 g | Freq: Two times a day (BID) | INTRAVENOUS | 0 refills | Status: AC

## 2021-06-30 MED ORDER — CHLORHEXIDINE GLUCONATE CLOTH 2 % EX PADS
6.0000 | MEDICATED_PAD | Freq: Every day | CUTANEOUS | Status: DC
  Administered 2021-06-30 – 2021-07-01 (×2): 6 via TOPICAL

## 2021-06-30 NOTE — Progress Notes (Signed)
Physical Therapy Treatment Patient Details Name: Mary Chambers MRN: [id number] DOB: 1970-10-15 Today's Date: 06/30/2021   History of Present Illness Mary Chambers is a 50 y.o. female who presents with complaints of back pain. Subsequently developed worsening symptoms, radiating pain down her R leg.  Fevers.  MRI was concerning for septic arthritis of the spine with possible abscess. s/p IR aspiration of right posterior paraspinal abscess, culture with rare enterococcus faecalis; incidental covid positive finding as well;  with medical history significant of hypertension, nephrolithiasis, morbid obesity, and prior back surgery back in 2020    PT Comments    Continuing work on functional mobility and activity tolerance;  able to walk further today, and pt herself noted she felt far less short of breath than other walks in her room; progressing well, hopes to go home tomorrow; PT in hearty agreement (provided she is medically ready)   Recommendations for follow up therapy are one component of a multi-disciplinary discharge planning process, led by the attending physician.  Recommendations may be updated based on patient status, additional functional criteria and insurance authorization.  Follow Up Recommendations  No PT follow up     Equipment Recommendations  Rolling walker with 5" wheels;3in1 (PT);Other (comment) (wide shower seat with back)    Recommendations for Other Services       Precautions / Restrictions Precautions Precautions: Back Precaution Comments: Back precautions for comfort     Mobility  Bed Mobility Overal bed mobility: Needs Assistance   Rolling: Supervision         General bed mobility comments: Smooth motion without much difficulty; supervision for safety    Transfers   Equipment used: Rolling walker (2 wheeled) Transfers: Sit to/from Stand Sit to Stand: Supervision            Ambulation/Gait Ambulation/Gait assistance:  Min guard;Supervision Gait Distance (Feet): 40 Feet Assistive device: Rolling walker (2 wheeled) Gait Pattern/deviations: Step-through pattern     General Gait Details: Trunk slightly flexed over RW; high steps to ensure foot clearance with mild foot drop bilaterally   Stairs             Wheelchair Mobility    Modified Rankin (Stroke Patients Only)       Balance     Sitting balance-Leahy Scale: Fair (approaching good)       Standing balance-Leahy Scale: Fair                              Cognition Arousal/Alertness: Awake/alert Behavior During Therapy: WFL for tasks assessed/performed Overall Cognitive Status: Within Functional Limits for tasks assessed                                        Exercises      General Comments General comments (skin integrity, edema, etc.): we discussed reconsulting [facility] re: getting a better fit for her AFOs      Pertinent Vitals/Pain Pain Assessment: Faces Faces Pain Scale: Hurts a little bit Pain Location: back and R leg Pain Descriptors / Indicators: Aching Pain Intervention(s): Limited activity within patient's tolerance    Home Living                      Prior Function            PT Goals (current goals  can now be found in the care plan section) Acute Rehab PT Goals Patient Stated Goal: to be able to get up more PT Goal Formulation: With patient Time For Goal Achievement: 07/10/21 Potential to Achieve Goals: Good Progress towards PT goals: Progressing toward goals    Frequency    Min 3X/week      PT Plan Current plan remains appropriate    Co-evaluation              AM-PAC PT "6 Clicks" Mobility   Outcome Measure  Help needed turning from your back to your side while in a flat bed without using bedrails?: None Help needed moving from lying on your back to sitting on the side of a flat bed without using bedrails?: None Help needed moving to and  from a bed to a chair (including a wheelchair)?: None Help needed standing up from a chair using your arms (e.g., wheelchair or bedside chair)?: A Little Help needed to walk in hospital room?: A Little Help needed climbing 3-5 steps with a railing? : A Little 6 Click Score: 21    End of Session   Activity Tolerance: Patient tolerated treatment well Patient left: in chair;with call bell/phone within reach Nurse Communication: Mobility status PT Visit Diagnosis: Other abnormalities of gait and mobility (R26.89)     Time: 1250-1320 PT Time Calculation (min) (ACUTE ONLY): 30 min  Charges:  $Gait Training: 8-22 mins $Therapeutic Activity: 8-22 mins                     Roney Marion, PT  Acute Rehabilitation Services Pager [phone] Office [place] 06/30/2021, 4:06 PM

## 2021-06-30 NOTE — Consult Note (Signed)
Department of Dental Medicine          INPATIENT CONSULT   Service Date:   06/30/2021 Admit Date:   06/22/2021  Patient Name:   Mary Chambers Date of Birth:   April 21, 1971 Medical Record Number: [id number]  Referring Provider:                Melodie Bouillon, M.D.  >  Plan/Recommendations <   Assessment There are no current signs of acute odontogenic infection including abscess, edema or erythema, or suspicious lesion requiring biopsy.   There are teeth with cavities and there is inflamed gingival tissue.  Recommendations No dental intervention indicated prior to cardiac surgery at this time.  Establish dental care at an outside office of the patient's choice for routine care including cleanings/periodontal therapy and periodic exams following cardiac surgery and once medically optimized.  Plan Discuss case with medical team and coordinate treatment as needed.  Discussed in detail all treatment options and recommendations with the patient and they are agreeable to the plan.    Thank you for consulting with Hospital Dentistry and for the opportunity to participate in this patient's treatment.  Should you have any questions or concerns, please contact the [facility] at [phone].    06/30/2021 Consult Note:   HISTORY OF PRESENT ILLNESS: Mary Chambers is a very pleasant 50 y.o. 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 She is anticipating possible AVR after her 6-week course of antibiotics. Hospital dentistry was consulted to complete a medically-necessary dental evaluation as part of their preoperative work-up.   DENTAL HISTORY: The patient reports that she has not been to a dentist in several years.  She  used to go to [facility] in [place] which she enjoyed, but just stopped going in for routine care.  She says that she does have an upper and lower partial denture but only wears the upper one because her lower one is uncomfortable and does not fit well.  She currently denies any dental/orofacial pain or sensitivity. Patient is able to manage oral secretions.  Patient denies dysphagia, odynophagia, dysphonia, SOB and neck pain.  Patient denies fever, rigors and malaise.   CHIEF COMPLAINT:  Preoperative dental consult.   Patient Active Problem List   Diagnosis Date Noted   Vertebral osteomyelitis ([place])    Septic arthritis ([place]) 06/23/2021   Transaminitis 06/23/2021   Abscess 06/23/2021   Sepsis ([place]) 06/23/2021   COVID-19 virus infection 06/23/2021   Suspected UTI 06/23/2021   Gastroesophageal reflux disease 12/15/2020   Screening for colorectal cancer 12/15/2020   Nausea without vomiting 12/15/2020   History of gastric ulcer 12/15/2020   Herniated lumbar disc without myelopathy 10/02/2019   Lumbar spinal stenosis 10/01/2019   Pain in left foot 03/29/2018   Arthritis 02/13/2018   Chronic back pain 02/13/2018   Migraines 02/13/2018   Morbidly obese ([place]) 02/13/2018   Synovial cyst of lumbar spine 10/15/2017   Osteoarthritis of knee 07/13/2017   Lumbar disc disease with radiculopathy 08/01/2016   Fatigue 05/04/2016   Headache 05/04/2016   Numbness of foot 05/04/2016   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   Arthrodesis status 02/02/2016   Chronic pain syndrome 02/02/2016   Elevated white blood cell count 12/01/2015   Lumbar stenosis with neurogenic claudication 05/07/2015  Bilateral leg edema 04/21/2015   Carpal tunnel syndrome of right wrist 12/14/2014   Renal atrophy, left 01/23/2014   Achilles tendonitis 01/16/2014   Right lower quadrant pain 12/18/2013   Hypertension, benign 06/03/2010   Urinary incontinence 06/03/2010   Cervical disc disease 09/09/2009   Anxious  depression 10/08/2007   Past Medical History:  Diagnosis Date   Arthritis    Depression    Hypertension    Kidney stone    Obesity    Past Surgical History:  Procedure Laterality Date   ABDOMINAL HYSTERECTOMY     BACK SURGERY     CARPAL TUNNEL RELEASE     CHOLECYSTECTOMY     IR FLUORO GUIDED NEEDLE PLC ASPIRATION/INJECTION LOC  06/23/2021   LITHOTRIPSY     neck fusion     TEE WITHOUT CARDIOVERSION N/A 06/28/2021   Procedure: TRANSESOPHAGEAL ECHOCARDIOGRAM (TEE);  Surgeon: Lelon Perla, MD;  Location: [facility] ENDOSCOPY;  Service: Cardiovascular;  Laterality: N/A;   TUBAL LIGATION     Allergies  Allergen Reactions   Baclofen Hives   Doxycycline Itching   Percocet [Oxycodone-Acetaminophen] Hives   Septra [Sulfamethoxazole-Trimethoprim] Hives   Gabapentin Rash   Current Facility-Administered Medications  Medication Dose Route Frequency Provider Last Rate Last Admin   acetaminophen (TYLENOL) tablet 650 mg  650 mg Oral Q6H PRN Lelon Perla, MD   650 mg at 06/29/21 1851   Or   acetaminophen (TYLENOL) suppository 650 mg  650 mg Rectal Q6H PRN Lelon Perla, MD       albuterol (VENTOLIN HFA) 108 (90 Base) MCG/ACT inhaler 2 puff  2 puff Inhalation Q6H PRN Lelon Perla, MD       ampicillin (OMNIPEN) 2 g in sodium chloride 0.9 % 100 mL IVPB  2 g Intravenous Q4H Lelon Perla, MD 300 mL/hr at 06/30/21 2052 2 g at 06/30/21 2052   ascorbic acid (VITAMIN C) tablet 500 mg  500 mg Oral Daily Lelon Perla, MD   500 mg at 06/30/21 1045   [START ON 07/01/2021] atorvastatin (LIPITOR) tablet 10 mg  10 mg Oral Daily Lelon Perla, MD       cefTRIAXone (ROCEPHIN) 2 g in sodium chloride 0.9 % 100 mL IVPB  2 g Intravenous Q12H Lelon Perla, MD 200 mL/hr at 06/30/21 2051 2 g at 06/30/21 2051   Chlorhexidine Gluconate Cloth 2 % PADS 6 each  6 each Topical Daily Nita Sells, MD   6 each at 06/30/21 2053   diclofenac Sodium (VOLTAREN) 1 % topical gel 2 g  2 g Topical  QID PRN Lelon Perla, MD       enalapril (VASOTEC) tablet 10 mg  10 mg Oral Daily Lelon Perla, MD   10 mg at 06/30/21 1045   enoxaparin (LOVENOX) injection 40 mg  40 mg Subcutaneous Q24H Lelon Perla, MD   40 mg at 06/30/21 1812   fentaNYL (SUBLIMAZE) injection 25 mcg  25 mcg Intravenous Q3H PRN Lelon Perla, MD       furosemide (LASIX) tablet 20 mg  20 mg Oral Daily Lelon Perla, MD   20 mg at 06/30/21 1045   guaiFENesin-dextromethorphan (ROBITUSSIN DM) 100-10 MG/5ML syrup 10 mL  10 mL Oral Q4H PRN Lelon Perla, MD       HYDROcodone-acetaminophen (NORCO/VICODIN) 5-325 MG per tablet 1 tablet  1 tablet Oral Q4H PRN Lelon Perla, MD       Or   HYDROcodone-acetaminophen [facility]) [phone]  MG per tablet 1 tablet  1 tablet Oral Q4H PRN Lelon Perla, MD   1 tablet at 06/30/21 1048   lidocaine (LIDODERM) 5 % 1 patch  1 patch Transdermal Q24H Lelon Perla, MD   1 patch at 06/30/21 1300   lidocaine (XYLOCAINE) 1 % (with pres) injection    PRN Candiss Norse A, PA-C   20 mL at 06/30/21 1548   loperamide (IMODIUM) capsule 2 mg  2 mg Oral PRN Nita Sells, MD   2 mg at 06/30/21 1507   ondansetron (ZOFRAN) tablet 4 mg  4 mg Oral Q6H PRN Lelon Perla, MD   4 mg at 06/27/21 2126   Or   ondansetron (ZOFRAN) injection 4 mg  4 mg Intravenous Q6H PRN Lelon Perla, MD   4 mg at 06/29/21 2235   pantoprazole (PROTONIX) EC tablet 40 mg  40 mg Oral Daily Lelon Perla, MD   40 mg at 06/30/21 1045   sodium chloride flush (NS) 0.9 % injection 3 mL  3 mL Intravenous Q12H Lelon Perla, MD   3 mL at 06/30/21 2109   traZODone (DESYREL) tablet 50 mg  50 mg Oral QHS Lelon Perla, MD   50 mg at 06/30/21 2053   zinc sulfate capsule 220 mg  220 mg Oral Daily Lelon Perla, MD   220 mg at 06/30/21 1045    LABS: Lab Results  Component Value Date   WBC 8.0 06/29/2021   HGB 10.9 (L) 06/29/2021   HCT 34.6 (L) 06/29/2021   MCV 85.0 06/29/2021   PLT  392 06/29/2021      Component Value Date/Time   NA 136 06/29/2021 0403   NA 139 09/28/2014 1405   K 3.7 06/29/2021 0403   K 3.9 09/28/2014 1405   CL 102 06/29/2021 0403   CL 108 (H) 09/28/2014 1405   CO2 27 06/29/2021 0403   CO2 24 09/28/2014 1405   GLUCOSE 89 06/29/2021 0403   GLUCOSE 96 09/28/2014 1405   BUN 7 06/29/2021 0403   BUN 15 09/28/2014 1405   CREATININE 0.80 06/29/2021 0403   CREATININE 0.86 09/28/2014 1405   CALCIUM 8.3 (L) 06/29/2021 0403   CALCIUM 8.5 09/28/2014 1405   GFRNONAA >60 06/29/2021 0403   GFRNONAA >60 09/28/2014 1405   GFRNONAA >60 12/18/2013 1429   GFRAA >60 04/22/2020 1207   GFRAA >60 09/28/2014 1405   GFRAA >60 12/18/2013 1429   No results found for: INR, PROTIME No results found for: PTT  Social History   Socioeconomic History   Marital status: Single    Spouse name: Not on file   Number of children: Not on file   Years of education: Not on file   Highest education level: Not on file  Occupational History   Not on file  Tobacco Use   Smoking status: Never   Smokeless tobacco: Never  Vaping Use   Vaping Use: Never used  Substance and Sexual Activity   Alcohol use: No   Drug use: Never   Sexual activity: Not Currently    Birth control/protection: None  Other Topics Concern   Not on file  Social History Narrative   Not on file   Social Determinants of Health   Financial Resource Strain: Not on file  Food Insecurity: Not on file  Transportation Needs: Not on file  Physical Activity: Not on file  Stress: Not on file  Social Connections: Not on file  Intimate Partner Violence: Not  on file   Family History  Problem Relation Age of Onset   Diabetes Mother    Hypertension Mother    Cancer Father    Parkinson's disease Father    Heart disease Father    Heart disease Sister    Heart disease Brother    Colon cancer Neg Hx    Rectal cancer Neg Hx    Stomach cancer Neg Hx      REVIEW OF SYSTEMS:  Reviewed with the  patient as per HPI. Psych: Patient denies having dental phobia.   VITAL SIGNS: BP 112/68 (BP Location: Left Wrist)   Pulse 98   Temp 98.3 F (36.8 C) (Oral)   Resp 16   Ht 5\' 7"  (1.702 m)   Wt 120.7 kg   SpO2 95%   BMI 41.66 kg/m    PHYSICAL EXAM: General:  Well-developed, comfortable and in no apparent distress. Neurological:  Alert and oriented to person, place and  time. Extraoral:  Facial symmetry present without any edema or erythema.  No swelling or lymphadenopathy.  TMJ asymptomatic without clicks or crepitations.  Intraoral:  Soft tissues appear well-perfused and mucous membranes moist.  FOM and vestibules soft and not raised. Oral cavity without mass or lesion. No signs of infection, parulis, sinus tract, edema or erythema evident upon exam.   (+) Bilateral mandibular tori   DENTAL EXAM:  All clinical findings charted.   Overall impression:  Fair remaining dentition. Oral hygiene:  Fair Periodontal:  Pink, healthy gingival tissue with blunted papilla.  Localized areas of gingival recession. Caries:  #20 Defective restorations:  #20 has existing composite restoration with fractured distal marginal ridge and recurrent decay Removable/fixed prosthodontics:  Existing maxillary and mandibular RPD; the patient did not bring these with her to the hospital so unable to assess. Occlusion:  Unable to assess molar occlusion.   (+) Non-functional teeth: #3, #4, #5, #14, #28   RADIOGRAPHIC EXAM:   06/28/21 Orthopanogram interpreted. Condyles seated bilaterally in fossas.  No evidence of abnormal pathology.  All visualized osseous structures appear WNL. Missing teeth #1, #2, #6, #7, #10, #12, #15, #16, #17, #18, #19, #29, #30, #31 and #32.  Multiple existing restorations noted on remaining dentition.   ASSESSMENT:  1.  Aortic insufficiency with Bicuspid aortic valve with vegetations consistent with Endocarditis 2.  COVID-19 3.  Preoperative dental exam 4.  Missing teeth 5.   Caries 6.  Defective dental restoration 7.  Gingival recession, localized 8.  Ill-fitting prosthesis 9.  Mandibular tori 10.  Postoperative bleeding risk   PLAN AND RECOMMENDATIONS: I discussed the risks, benefits, and complications of various scenarios with the patient in relationship to their medical and dental conditions, which included systemic infection such as endocarditis, bacteremia or other serious issues that could potentially occur either before, during or after their anticipated heart surgery if dental/oral concerns are not addressed.  I explained that if any chronic or acute dental/oral infection(s) are addressed and subsequently not maintained following medical optimization and recovery, their risk of the previously mentioned complications are just as high and could potentially occur postoperatively.  I explained all significant findings of the dental consultation with the patient including a cavity on tooth #20 and the need for a dental comprehensive exam and cleaning, and the recommended care including returning to her once primary dental office or finding a new office to establish care with in order to optimize them following heart surgery and recovery from a dental standpoint.  The patient verbalized understanding  of all findings, discussion, and recommendations. We then discussed various treatment options to include no treatment, multiple extractions with alveoloplasty, pre-prosthetic surgery as indicated, periodontal therapy, dental restorations, root canal therapy, crown and bridge therapy, implant therapy, and replacement of missing teeth as indicated.  The patient verbalized understanding of all options.  She is agreeable to the plan of returning to the dentist for routine dental care as well as restorative on tooth #20 and any other necessary treatment. Plan to discuss all findings and recommendations with medical team and coordinate future care as needed.  The patient will need to  establish care at a dental office of her choice for routine dental care including replacement of missing teeth as needed, cleanings and exams.  All questions and concerns were invited and addressed.  The patient tolerated today's visit well.  Loretto Benson [place], D.M.D.

## 2021-06-30 NOTE — Progress Notes (Signed)
Brief cardiology progress notes:  Appreciate input from multiple teams. Based on CT surgery evaluation, no plans for urgent surgery.  Recommendations: Complete 6 weeks IV antibiotics We will arrange for outpatient follow up with cards. Will set up for outpatient TEE to reassess AoV regurgitation. If regurgitation still severe, will arrange for outpatient [facility] and then send to Dr. Kipp Brood to discuss surgery.  Cardiology will sign off. Please contact us with any questions.  [name], MD, PhD, [facility] at [facility] at [facility] [address] [place], [place] [address] [phone]

## 2021-06-30 NOTE — Progress Notes (Signed)
PROGRESS NOTE   Mary Chambers  RXV:[id number] DOB: Oct 12, 1970 DOA: 06/22/2021 PCP: [name], NP  Brief Narrative:  50 year old home dwelling white female HTN nephrolithiasis morbid obesity BMI 42 Status post PLIF L1-L3 10/01/2019 Dr. Saintclair Halsted Recent Rx urgent care urinary tract infection discharged on Keflex found to have right lower back pain sharp in nature radiating down right leg T-max 101 Also nausea vomiting On admission WBC 11 sed rate 96 MRI = inflammatory changes L2-L5 + septic arthritis +2.4 X1.8X 1.6.  Area of abscess Neurosurgery consulted, IR also consulted underwent aspiration under IR showing Enterococcus faecalis Cardiology consulted in addition could not rule out vegetation TEE showed bicuspid AV valve with possible vegetation Cardiothoracic surgery involved Also found to be coincidently positive for coronavirus 19   Hospital-Problem based course  Enterococcus faecalis endocarditis - Will need definitive valve surgery as per CVTS-they are aware of the patient-no emergent surgery is required at this time per them - ID directing management--need dual Ampicillin and Ceftriaxone until 08/04/21  Septic arthritis with spinal abscess -No definitive surgery planned by neurosurgery, appreciate insights -Medications as above  Mild COVID-19 infection -Minimal symptoms,can D/C precaution 10/2 -Paxlovid completed 9/27  Recent urinary tract infection -Completed antibiotics  HTN - Continue enalapril 10 daily, Lasix 20 daily  Low back pain -Likely from her underlying abscess-first choice Norco every 4 as needed moderate pain second choice higher dosing Norco third choice Fentanyl 25 mcg every 3 as needed   DVT prophylaxis: Lovenox Code Status: Full code Family Communication: None present Disposition:  Status is: Inpatient  Remains inpatient appropriate because:Persistent severe electrolyte disturbances and Ongoing diagnostic testing needed not  appropriate for outpatient work up  Dispo: The patient is from: Home              Anticipated d/c is to: SNF              Patient currently is not medically stable to d/c.   Difficult to place patient No       Consultants:  Infectious disease Neurosurgery Cardiology  Procedures: Multiple  Antimicrobials:  Currently ceftriaxone and ampicillin   Subjective:  Pain is better more mobile-sitting in chair No distress no fever 2 loose stools today and occasional incontinent episodes--took imodium last pm  Objective: Vitals:   06/29/21 1700 06/29/21 2119 06/30/21 0337 06/30/21 0900  BP: (!) 111/58 104/64 125/62 (!) 119/52  Pulse: 91 95 89 98  Resp: 18 18  14   Temp: 98.8 F (37.1 C) 98.2 F (36.8 C) 98.1 F (36.7 C) (!) 97.5 F (36.4 C)  TempSrc: Axillary Oral Oral Axillary  SpO2: 92% 94% 100% 94%  Weight:      Height:        Intake/Output Summary (Last 24 hours) at 06/30/2021 1356 Last data filed at 06/30/2021 0900 Gross per 24 hour  Intake 920 ml  Output 900 ml  Net 20 ml    Filed Weights   06/22/21 1940  Weight: 120.7 kg    Examination:  Awake obese coherent CTA B no added sound thick neck Mallampati 4  S2 no murmur no rub no gallop, RRR ROM intact moving 4 limbs equally without deficit power 5/5 reflexes deferred sensory grossly intact smile is symmetric   Data Reviewed: personally reviewed   CBC    Component Value Date/Time   WBC 8.0 06/29/2021 0403   RBC 4.07 06/29/2021 0403   HGB 10.9 (L) 06/29/2021 0403   HGB 14.5 09/28/2014 1405   HCT 34.6 (  L) 06/29/2021 0403   HCT 44.1 09/28/2014 1405   PLT 392 06/29/2021 0403   PLT 260 09/28/2014 1405   MCV 85.0 06/29/2021 0403   MCV 87 09/28/2014 1405   MCH 26.8 06/29/2021 0403   MCHC 31.5 06/29/2021 0403   RDW 13.6 06/29/2021 0403   RDW 13.5 09/28/2014 1405   LYMPHSABS 2.7 06/29/2021 0403   LYMPHSABS 2.7 09/28/2014 1405   MONOABS 0.7 06/29/2021 0403   MONOABS 0.7 09/28/2014 1405   EOSABS 0.1  06/29/2021 0403   EOSABS 0.3 09/28/2014 1405   BASOSABS 0.0 06/29/2021 0403   BASOSABS 0.1 09/28/2014 1405   CMP Latest Ref Rng & Units 06/29/2021 06/28/2021 06/27/2021  Glucose 70 - 99 mg/dL 89 95 87  BUN 6 - 20 mg/dL 7 7 8   Creatinine 0.44 - 1.00 mg/dL 0.80 0.69 0.92  Sodium 135 - 145 mmol/L 136 136 140  Potassium 3.5 - 5.1 mmol/L 3.7 3.9 3.4(L)  Chloride 98 - 111 mmol/L 102 103 100  CO2 22 - 32 mmol/L 27 26 29   Calcium 8.9 - 10.3 mg/dL 8.3(L) 8.3(L) 8.9  Total Protein 6.5 - 8.1 g/dL 6.6 6.4(L) 8.3(H)  Total Bilirubin 0.3 - 1.2 mg/dL 0.5 0.3 0.5  Alkaline Phos 38 - 126 U/L 80 85 105  AST 15 - 41 U/L 17 16 22   ALT 0 - 44 U/L 20 24 33     Radiology Studies: DG Orthopantogram  Result Date: 06/29/2021 CLINICAL DATA:  Poor dentition for heart valve surgery. EXAM: ORTHOPANTOGRAM/PANORAMIC COMPARISON:  None. FINDINGS: Multiple mandibular and maxillary teeth. No visible periapical abscess. No focal mandibular abnormality. IMPRESSION: No acute findings. Electronically Signed   By: Rolm Baptise M.D.   On: 06/29/2021 13:46   ECHO TEE  Result Date: 06/28/2021    TRANSESOPHOGEAL ECHO REPORT   Patient Name:   Mary Chambers Date of Exam: 06/28/2021 Medical Rec #:  [id number]                 Height:       67.0 in Accession #:    [id number]                Weight:       266.0 lb Date of Birth:  1971-09-07                 BSA:          2.283 m Patient Age:    29 years                  BP:           120/67 mmHg Patient Gender: F                         HR:           104 bpm. Exam Location:  Inpatient Procedure: Cardiac Doppler, Color Doppler and Transesophageal Echo Indications:     Bacteremia  History:         Patient has prior history of Echocardiogram examinations, most                  recent 06/26/2021.  Sonographer:     Merrie Roof RDCS Referring Phys:  [id number] Abigail Butts Diagnosing Phys: Kirk Ruths MD PROCEDURE: After discussion of the risks and benefits of a TEE, an informed  consent was obtained from the patient. The transesophogeal probe was passed without difficulty through the esophogus of  the patient. Local oropharyngeal anesthetic was provided with Cetacaine. Sedation performed by different physician. The patient was monitored while under deep sedation. The patient's vital signs; including heart rate, blood pressure, and oxygen saturation; remained stable throughout the procedure. The patient developed no complications during the procedure. IMPRESSIONS  1. Bicuspid aortic valve (left and right cusps appear to be fused); probable vegetation noted; severe AI.  2. Left ventricular ejection fraction, by estimation, is 60 to 65%. The left ventricle has normal function. The left ventricle has no regional wall motion abnormalities.  3. Right ventricular systolic function is normal. The right ventricular size is normal.  4. No left atrial/left atrial appendage thrombus was detected.  5. The mitral valve is normal in structure. No evidence of mitral valve regurgitation.  6. The aortic valve is bicuspid. Aortic valve regurgitation is severe. FINDINGS  Left Ventricle: Left ventricular ejection fraction, by estimation, is 60 to 65%. The left ventricle has normal function. The left ventricle has no regional wall motion abnormalities. The left ventricular internal cavity size was normal in size. Right Ventricle: The right ventricular size is normal. Right ventricular systolic function is normal. Left Atrium: Left atrial size was normal in size. No left atrial/left atrial appendage thrombus was detected. Right Atrium: Right atrial size was normal in size. Pericardium: There is no evidence of pericardial effusion. Mitral Valve: The mitral valve is normal in structure. No evidence of mitral valve regurgitation. Tricuspid Valve: The tricuspid valve is normal in structure. Tricuspid valve regurgitation is trivial. Aortic Valve: The aortic valve is bicuspid. Aortic valve regurgitation is severe.  Pulmonic Valve: The pulmonic valve was normal in structure. Pulmonic valve regurgitation is trivial. Aorta: The aortic root is normal in size and structure. There is minimal (Grade I) plaque involving the descending aorta. IAS/Shunts: No atrial level shunt detected by color flow Doppler. Additional Comments: Bicuspid aortic valve (left and right cusps appear to be fused); probable vegetation noted; severe AI. Kirk Ruths MD Electronically signed by Kirk Ruths MD Signature Date/Time: 06/28/2021/3:42:19 PM    Final    [place] EKG SITE RITE  Result Date: 06/29/2021 If Site Rite image not attached, placement could not be confirmed due to current cardiac rhythm.    Scheduled Meds:  vitamin C  500 mg Oral Daily   [START ON 07/01/2021] atorvastatin  10 mg Oral Daily   enalapril  10 mg Oral Daily   enoxaparin (LOVENOX) injection  40 mg Subcutaneous Q24H   furosemide  20 mg Oral Daily   lidocaine  1 patch Transdermal Q24H   pantoprazole  40 mg Oral Daily   sodium chloride flush  3 mL Intravenous Q12H   traZODone  50 mg Oral QHS   zinc sulfate  220 mg Oral Daily   Continuous Infusions:  ampicillin (OMNIPEN) IV 2 g (06/30/21 0834)   cefTRIAXone (ROCEPHIN)  IV 2 g (06/30/21 1045)     LOS: 7 days   Time spent: [place], MD [facility] To contact the attending provider between 7A-7P or the covering provider during after hours 7P-7A, please log into the web site [email or web address] and access using universal [place] password for that web site. If you do not have the password, please call the hospital operator.  06/30/2021, 1:56 PM

## 2021-06-30 NOTE — TOC Initial Note (Signed)
Transition of Care [facility]) - Initial/Assessment Note    Patient Details  Name: Mary Chambers MRN: [id number] Date of Birth: 07-13-1971  Transition [facility]) CM/SW Contact:    Marilu Favre, RN Phone Number: 06/30/2021, 11:53 AM  Clinical Narrative:                 Spoke to patient at bedside. Patient from home with family, confirmed face sheet information.   Discussed IV ABX at home. Patient's infusion company will be [employer] with [employer] will provide teaching to her and her sons on PICC / IV ABX prior to discharge. Pam has arranged a HHRN with [facility] but the [facility] will not be present every time a dose is due. Patient voiced understanding.  Expected Discharge Plan: [place] Barriers to Discharge: Continued Medical Work up   Patient Goals and CMS Choice Patient states their goals for this hospitalization and ongoing recovery are:: to return to home CMS Medicare.gov Compare Post Acute Care list provided to:: Patient Choice offered to / list presented to : Patient  Expected Discharge Plan and Services Expected Discharge Plan: [place]   Discharge Planning Services: CM Consult Post Acute Care Choice: [place] arrangements for the past 2 months: Single Family Home                 DME Arranged: N/A DME Agency: NA       HH Arranged: Higher education careers adviser spoke with at [place]: Darryl Lent with [employer] has arranged [place] with [place]  Prior Living Arrangements/Services Living arrangements for the past 2 months: Single Family Home Lives with:: Relatives Patient language and need for interpreter reviewed:: Yes Do you feel safe going back to the place where you live?: Yes      Need for Family Participation in Patient Care: Yes (Comment) Care giver support system in place?: Yes (comment)   Criminal Activity/Legal Involvement Pertinent to Current Situation/Hospitalization: No - Comment as  needed  Activities of Daily Living Home Assistive Devices/Equipment: Cane (specify quad or straight) ADL Screening (condition at time of admission) Patient's cognitive ability adequate to safely complete daily activities?: Yes Is the patient deaf or have difficulty hearing?: No Does the patient have difficulty seeing, even when wearing glasses/contacts?: No Does the patient have difficulty concentrating, remembering, or making decisions?: No Patient able to express need for assistance with ADLs?: Yes Does the patient have difficulty dressing or bathing?: No Independently performs ADLs?: Yes (appropriate for developmental age) Does the patient have difficulty walking or climbing stairs?: Yes Weakness of Legs: Both Weakness of Arms/Hands: Both  Permission Sought/Granted   Permission granted to share information with : No              Emotional Assessment Appearance:: Appears stated age Attitude/Demeanor/Rapport: Engaged Affect (typically observed): Accepting Orientation: : Oriented to Self, Oriented to Place, Oriented to  Time, Oriented to Situation Alcohol / Substance Use: Not Applicable Psych Involvement: No (comment)  Admission diagnosis:  Shortness of breath [R06.02] Septic arthritis (HCC) [M00.9] Back pain [M54.9] Fever [R50.9] Patient Active Problem List   Diagnosis Date Noted   Vertebral osteomyelitis ([place])    Septic arthritis ([place]) 06/23/2021   Transaminitis 06/23/2021   Abscess 06/23/2021   Sepsis ([place]) 06/23/2021   COVID-19 virus infection 06/23/2021   Suspected UTI 06/23/2021   Gastroesophageal reflux disease 12/15/2020   Screening for colorectal cancer 12/15/2020  Nausea without vomiting 12/15/2020   History of gastric ulcer 12/15/2020   Herniated lumbar disc without myelopathy 10/02/2019   Lumbar spinal stenosis 10/01/2019   Pain in left foot 03/29/2018   Arthritis 02/13/2018   Chronic back pain 02/13/2018   Migraines 02/13/2018   Morbidly obese  ([place]) 02/13/2018   Synovial cyst of lumbar spine 10/15/2017   Osteoarthritis of knee 07/13/2017   Lumbar disc disease with radiculopathy 08/01/2016   Fatigue 05/04/2016   Headache 05/04/2016   Numbness of foot 05/04/2016   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   Arthrodesis status 02/02/2016   Chronic pain syndrome 02/02/2016   Elevated white blood cell count 12/01/2015   Lumbar stenosis with neurogenic claudication 05/07/2015   Bilateral leg edema 04/21/2015   Carpal tunnel syndrome of right wrist 12/14/2014   Renal atrophy, left 01/23/2014   Achilles tendonitis 01/16/2014   Right lower quadrant pain 12/18/2013   Hypertension, benign 06/03/2010   Urinary incontinence 06/03/2010   Cervical disc disease 09/09/2009   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   PCP:  [name], NP Pharmacy:   [id number] - [place], [place] [address] [place] [address] Phone: [phone] Fax: [phone]     Social Determinants of Health ([place]) Interventions    Readmission Risk Interventions Readmission Risk Prevention Plan 10/03/2019  Transportation Screening Complete  PCP or Specialist Appt within 3-5 Days Not Complete  Not Complete comments pt to arrange due to holiday  [place] or [place] Complete  Social Work Consult for [place] Planning/Counseling Complete  Palliative Care Screening Not Applicable  Medication Review (RN Care Manager) Referral to Pharmacy  Some recent data might be hidden

## 2021-06-30 NOTE — Progress Notes (Signed)
Subjective: Patient reports no acute events overnight. She is in better spirits this morning. Bedside PICC placement was unsuccessful. Plan for IR to place PICC.   Objective: Vital signs in last 24 hours: Temp:  [98.1 F (36.7 C)-98.8 F (37.1 C)] 98.1 F (36.7 C) (09/29 0337) Pulse Rate:  [89-95] 89 (09/29 0337) Resp:  [18] 18 (09/28 2119) BP: (104-125)/(58-64) 125/62 (09/29 0337) SpO2:  [92 %-100 %] 100 % (09/29 0337)  Intake/Output from previous day: 09/28 0701 - 09/29 0700 In: 920 [P.O.:520; IV Piggyback:400] Out: 550 [Urine:550] Intake/Output this shift: No intake/output data recorded.  Physical Exam: Patient is awake, A/O X 4, and conversant. She is laying comfortably in bed in NAD. VSS. Speech is fluent and appropriate. MAEW with good strength. Sensation to light touch is intact. PERLA, EOMI. CNs grossly intact.  Lab Results: Recent Labs    06/28/21 0347 06/29/21 0403  WBC 8.9 8.0  HGB 11.0* 10.9*  HCT 34.4* 34.6*  PLT 371 392   BMET Recent Labs    06/28/21 0347 06/29/21 0403  NA 136 136  K 3.9 3.7  CL 103 102  CO2 26 27  GLUCOSE 95 89  BUN 7 7  CREATININE 0.69 0.80  CALCIUM 8.3* 8.3*    Studies/Results: DG Orthopantogram  Result Date: 06/29/2021 CLINICAL DATA:  Poor dentition for heart valve surgery. EXAM: ORTHOPANTOGRAM/PANORAMIC COMPARISON:  None. FINDINGS: Multiple mandibular and maxillary teeth. No visible periapical abscess. No focal mandibular abnormality. IMPRESSION: No acute findings. Electronically Signed   By: Rolm Baptise M.D.   On: 06/29/2021 13:46   ECHO TEE  Result Date: 06/28/2021    TRANSESOPHOGEAL ECHO REPORT   Patient Name:   Mary Chambers Date of Exam: 06/28/2021 Medical Rec #:  [id number]                 Height:       67.0 in Accession #:    [id number]                Weight:       266.0 lb Date of Birth:  06-26-1971                 BSA:          2.283 m Patient Age:    89 years                  BP:           120/67 mmHg  Patient Gender: F                         HR:           104 bpm. Exam Location:  Inpatient Procedure: Cardiac Doppler, Color Doppler and Transesophageal Echo Indications:     Bacteremia  History:         Patient has prior history of Echocardiogram examinations, most                  recent 06/26/2021.  Sonographer:     Merrie Roof RDCS Referring Phys:  [id number] Abigail Butts Diagnosing Phys: Kirk Ruths MD PROCEDURE: After discussion of the risks and benefits of a TEE, an informed consent was obtained from the patient. The transesophogeal probe was passed without difficulty through the esophogus of the patient. Local oropharyngeal anesthetic was provided with Cetacaine. Sedation performed by different physician. The patient was monitored while under deep sedation. The patient's vital signs;  including heart rate, blood pressure, and oxygen saturation; remained stable throughout the procedure. The patient developed no complications during the procedure. IMPRESSIONS  1. Bicuspid aortic valve (left and right cusps appear to be fused); probable vegetation noted; severe AI.  2. Left ventricular ejection fraction, by estimation, is 60 to 65%. The left ventricle has normal function. The left ventricle has no regional wall motion abnormalities.  3. Right ventricular systolic function is normal. The right ventricular size is normal.  4. No left atrial/left atrial appendage thrombus was detected.  5. The mitral valve is normal in structure. No evidence of mitral valve regurgitation.  6. The aortic valve is bicuspid. Aortic valve regurgitation is severe. FINDINGS  Left Ventricle: Left ventricular ejection fraction, by estimation, is 60 to 65%. The left ventricle has normal function. The left ventricle has no regional wall motion abnormalities. The left ventricular internal cavity size was normal in size. Right Ventricle: The right ventricular size is normal. Right ventricular systolic function is normal. Left Atrium: Left  atrial size was normal in size. No left atrial/left atrial appendage thrombus was detected. Right Atrium: Right atrial size was normal in size. Pericardium: There is no evidence of pericardial effusion. Mitral Valve: The mitral valve is normal in structure. No evidence of mitral valve regurgitation. Tricuspid Valve: The tricuspid valve is normal in structure. Tricuspid valve regurgitation is trivial. Aortic Valve: The aortic valve is bicuspid. Aortic valve regurgitation is severe. Pulmonic Valve: The pulmonic valve was normal in structure. Pulmonic valve regurgitation is trivial. Aorta: The aortic root is normal in size and structure. There is minimal (Grade I) plaque involving the descending aorta. IAS/Shunts: No atrial level shunt detected by color flow Doppler. Additional Comments: Bicuspid aortic valve (left and right cusps appear to be fused); probable vegetation noted; severe AI. Kirk Ruths MD Electronically signed by Kirk Ruths MD Signature Date/Time: 06/28/2021/3:42:19 PM    Final    [place] EKG SITE RITE  Result Date: 06/29/2021 If Site Rite image not attached, placement could not be confirmed due to current cardiac rhythm.   Assessment/Plan: 50 y.o. female with acute inflammatory changes in the right paraspinous tissue of the lumbar spine with L2-L5 septic arthritis. Incidentally found to be positive for COVID 19. 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 CT on board and planning on heart cath with repeat TEE after completion of 6 weeks abx therapy to determine surgery needs, if any indicated. Prior thoracic spinal surgery should not affect surgery for heart valve if needed. Her neurological examination is unchanged and she continues at be at her baseline. Her pain is slowly improving. Continue abx per ID. Plan for PICC placement with IR. No new neurosurgical recommendations at this time. Call with any questions.    LOS: 7 days     Marvis Moeller, DNP,  NP-C 06/30/2021, 9:34 AM

## 2021-06-30 NOTE — Progress Notes (Signed)
PHARMACY CONSULT NOTE FOR:  OUTPATIENT  PARENTERAL ANTIBIOTIC THERAPY (OPAT)  Indication: Bateremia/Endocardits/Paraspinal Abscess Regimen: Ampicillin 12g IV q24h administered as a continuous infusion Ceftriaxone 2g IV q12h End date: 08/05/21  IV antibiotic discharge orders are pended. To discharging provider:  please sign these orders via discharge navigator,  Select New Orders & click on the button choice - Manage This Unsigned Work.     Thank you for allowing pharmacy to be a part of this patient's care.  Lestine Box, PharmD PGY2 Infectious Diseases Pharmacy Resident   Please check [email or web address] for unit-specific pharmacy phone numbers

## 2021-06-30 NOTE — Progress Notes (Signed)
Went to patient room to place a PICC line. Assessed vein with ultrasound, no brachial and basilic vein seen during assessment. Noted cephalic vein almost in the shoulder. Had history of PICC placement in the chest per patient. Recommended IR to place, primary RN to notify MD.

## 2021-06-30 NOTE — Procedures (Signed)
PROCEDURE SUMMARY:  Successful placement of image-guided single lumen PICC line to the right basilic vein. Length 46  cm. Tip at lower SVC/RA. No complications. EBL = < 3 mL. PICC flushed, aspirated, heparin locked, capped, dressed. Ready for immediate use.  Please see imaging section of Epic for full dictation.   Joaquim Nam PA-C 06/30/2021 4:16 PM

## 2021-07-01 ENCOUNTER — Inpatient Hospital Stay (HOSPITAL_COMMUNITY): Payer: 59

## 2021-07-01 DIAGNOSIS — Z01818 Encounter for other preprocedural examination: Secondary | ICD-10-CM

## 2021-07-01 DIAGNOSIS — R6520 Severe sepsis without septic shock: Secondary | ICD-10-CM | POA: Diagnosis not present

## 2021-07-01 DIAGNOSIS — A419 Sepsis, unspecified organism: Secondary | ICD-10-CM | POA: Diagnosis not present

## 2021-07-01 HISTORY — PX: IR FLUORO GUIDE CV LINE RIGHT: IMG2283

## 2021-07-01 IMAGING — XA IR FLUORO GUIDE CV LINE*R*
1 series · 1 of 1 positions shown · non-contrast
Comparison: none

INDICATION: Endocarditis, needing 6 weeks antibiotics, dual-lumen needed for
ampicillin and ceftriaxone administration

[Series 1: fl (-) angio · 1 of 1 slices shown]
[im 1/1]
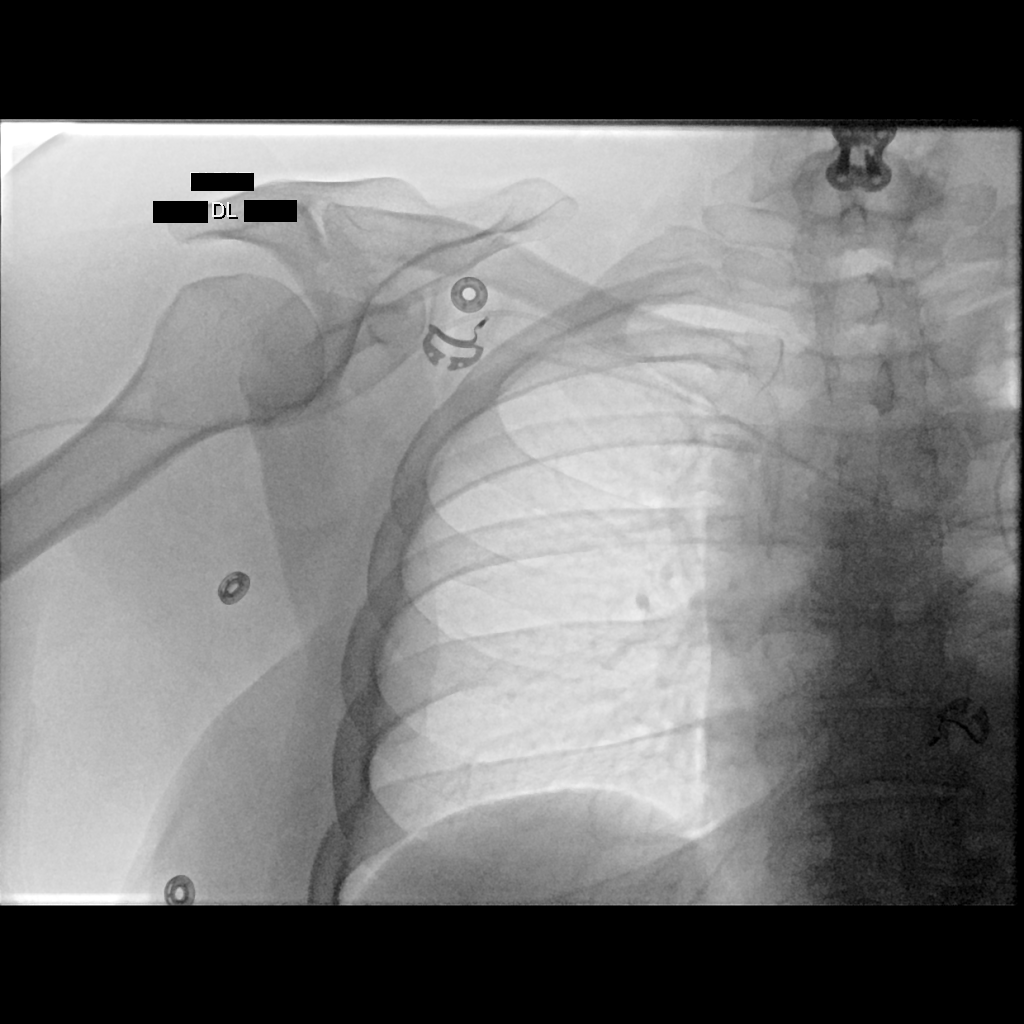

[1 of 1 positions shown; findings below may reference images not displayed]

EXAM:
FLUOROSCOPIC GUIDED EXCHANGE OF A SINGLE LUMEN TO DUAL LUMEN PICC.

MEDICATIONS:
None

ANESTHESIA/SEDATION:
None

COMPLICATIONS:
No complications were encountered and no significant blood loss was
encountered.

FLUOROSCOPY TIME:  0.8 minutes, (2.1 mGy)

PROCEDURE:
Informed written consent was obtained from the patient after a
thorough discussion of the procedural risks, benefits and
alternatives. All questions were addressed. Maximal Sterile Barrier
Technique was utilized including caps, mask, sterile gowns, sterile
gloves, sterile drape, hand hygiene and skin antiseptic. A timeout
was performed prior to the initiation of the procedure.

Patient was position in the supine position on the fluoroscopy table
with the right arm abducted 90 degrees.

Once the patient was prepped and draped in the usual sterile
fashion, a fluoroscopic image of the indwelling PICC was obtained.

A wire was passed through the indwelling catheter, catheter removed
over the wire, a sheath with peel-away was used to introduced a new,
dual-lumen PICC was inserted over the wire, amputated at 46 cm as
before.

Dual-lumen, power injectable PICC in the basilic vein. Tip confirmed
in the superior vena cava, and the catheter is ready for use.

Stat lock was placed.

Patient tolerated the procedure well and remained hemodynamically
stable throughout.

Performed by: EGGERT, PA-C under direct supervision

## 2021-07-01 MED ORDER — HYDROCODONE-ACETAMINOPHEN 10-325 MG PO TABS: 1.0000 | ORAL_TABLET | ORAL | 0 refills | Status: DC | PRN

## 2021-07-01 MED ORDER — LOPERAMIDE HCL 2 MG PO CAPS: 2.0000 mg | ORAL_CAPSULE | ORAL | 0 refills | Status: AC | PRN

## 2021-07-01 MED ORDER — HYDROCODONE-ACETAMINOPHEN 5-325 MG PO TABS: 1.0000 | ORAL_TABLET | ORAL | 0 refills | Status: DC | PRN

## 2021-07-01 NOTE — Plan of Care (Signed)
  Problem: Education: Goal: Knowledge of General Education information will improve Description: Including pain rating scale, medication(s)/side effects and non-pharmacologic comfort measures Outcome: Adequate for Discharge   Problem: Health Behavior/Discharge Planning: Goal: Ability to manage health-related needs will improve Outcome: Adequate for Discharge   Problem: Clinical Measurements: Goal: Ability to maintain clinical measurements within normal limits will improve Outcome: Adequate for Discharge   Problem: Clinical Measurements: Goal: Will remain free from infection Outcome: Adequate for Discharge   Problem: Clinical Measurements: Goal: Respiratory complications will improve Outcome: Adequate for Discharge   Problem: Clinical Measurements: Goal: Cardiovascular complication will be avoided Outcome: Adequate for Discharge

## 2021-07-01 NOTE — Discharge Summary (Signed)
Physician Discharge Summary  Caralynn Gelber OJJ:[id number] DOB: May 22, 1971 DOA: 06/22/2021  PCP: [name], NP  Admit date: 06/22/2021 Discharge date: 07/01/2021  Time spent: 37 minutes  Recommendations for Outpatient Follow-up:  Complete ampicillin and ceftriaxone 08/05/2021 and then pull PICC line subsequently Labs as per [facility] PICC line OPAT policy Pain medications prescribed on discharge Limited prescription and will need follow-up I will CC cardiothoracic surgery and cardiology on discharge to ensure follow-up is made for consideration of cath and endocarditis treatment Will need coordination with dentistry for cleaning and removal of cavity on tooth 20  Discharge Diagnoses:  MAIN problem for hospitalization   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 Saintclair Halsted Recent Rx urgent care urinary tract infection discharged on Keflex found to have right lower back pain sharp in nature radiating down right leg T-max 101 Also nausea vomiting On admission WBC 11 sed rate 96 MRI = inflammatory changes L2-L5 + septic arthritis +2.4 X1.8X 1.6.  Area of abscess Neurosurgery consulted, IR also consulted underwent aspiration under IR showing Enterococcus faecalis Cardiology consulted in addition could not rule out vegetation TEE showed bicuspid AV valve with possible vegetation Cardiothoracic surgery involved Also found to be coincidently positive for coronavirus 19   Hospital Course:  Enterococcus faecalis endocarditis - Will need definitive valve  surgery as per [facility]-they are aware of the patient -no emergent surgery is required at this time per them--dentistry also saw the patient during hospital stay and advocated that she have cleaning and possibly removal of tooth #20 and she will need to follow-up with them - ID directing management--need dual Ampicillin and Ceftriaxone until 08/04/21 -Subsequently will need either cardiac cath and or echocardiogram as per cardiology and cardiothoracic who will be CCed on this   Septic arthritis with spinal abscess -No definitive surgery planned by neurosurgery, appreciate insights -Medications as above   Mild COVID-19 infection -Minimal symptoms,can D/C precaution 10/2 -Paxlovid completed 9/27   Recent urinary tract infection -Completed antibiotics   HTN - Continue enalapril 10 daily, Lasix 20 daily  Low back pain -Likely from her underlying abscess-first choice Norco every 4 as needed moderate pain second choice higher dosing Norco -This is probably from the abscess-she is ambulatory and she will get assistive equipment including walker 3 and 1 etc. and home health therapy in the outpatient setting  Discharge Exam: Vitals:   06/30/21 2200 07/01/21 0621  BP: 112/68 (!) 106/56  Pulse: 98 90  Resp: 16 16  Temp: 98.3 F (36.8 C) 98.6 F (37 C)  SpO2: 95% 93%    Subj on day of d/c   Awake alert coherent no distress Pain is manageable 4-5/10 No fever no chills Moving around some Some diarrhea occasionally but minimal and only occasional with incontinence-no chest pain no fever   General Exam on discharge  Awake coherent no distress EOMI NCAT no focal deficit S1-S2 no murmur no rub no gallop Abdomen soft obese nontender no rebound no guarding No lower extremity edema Neurologically intact moving all 4 limbs Mood appropriate  Discharge Instructions   Discharge Instructions     [facility] pharmacist to adjust dose for Vancomycin, Aminoglycosides and other  anti-infective therapies as requested by physician.   Complete by: As directed    [facility] to provide Cath Flo 44m   Complete by: As directed    Administer for PICC line occlusion and as ordered by physician for other access device issues.   Anaphylaxis Kit: Provided to treat any anaphylactic reaction to the medication being provided to the patient if First Dose or when requested by physician   Complete by: As directed    Epinephrine 120mml vial / amp: Administer 0.43m49m0.43ml7mubcutaneously once for moderate to severe anaphylaxis, nurse to call physician and pharmacy when reaction occurs and call 911 if needed for immediate care   Diphenhydramine 50mg9mIV vial: Administer 25-50mg 44mM PRN for first dose reaction, rash, itching, mild reaction, nurse to call physician and pharmacy when reaction occurs   Sodium Chloride 0.9% NS 500ml I47mdminister if needed for hypovolemic blood pressure drop or as ordered by physician after call to physician with anaphylactic reaction   Change dressing on IV access line weekly and PRN   Complete by: As directed    Diet - low sodium heart healthy   Complete by: As directed    Discharge instructions   Complete by: As directed    Complete antibiotics as per Infectious disease and you will need outpatient management with multiple specialists involving cardiology and cardiothoracic surgery Eventually may require heart valve surgery consideration in the outpatient setting They will pull your PICC line on completion of your antibiotics I will CC all specialists who have been involved and you will need periodic labs with home health per their protocol as you are on antibiotics   Flush IV access with Sodium Chloride 0.9% and Heparin 10 units/ml or 100 units/ml   Complete by: As directed    Home infusion instructions - [facility]   Complete by: As directed    Instructions: Flush IV access with Sodium Chloride 0.9% and Heparin 10units/ml or  100units/ml   Change dressing on IV access line: Weekly and PRN   Instructions Cath Flo 2mg: Ad80mister for PICC Line occlusion and as ordered by physician for other access device   [facility] pharmacist to adjust dose for: Vancomycin, Aminoglycosides and other anti-infective therapies as requested by physician   Increase activity slowly   Complete by: As directed    Method of administration may be changed at the discretion of home infusion pharmacist based upon assessment of the patient and/or caregiver's ability to self-administer the medication ordered   Complete by: As directed    No wound care   Complete by: As directed       Allergies as of 07/01/2021       Reactions   Baclofen Hives   Doxycycline Itching   Percocet [oxycodone-acetaminophen] Hives   Septra [sulfamethoxazole-trimethoprim] Hives   Gabapentin Rash        Medication List     STOP taking these medications    benzonatate 100 MG capsule Commonly known as: TESSALON   cephALEXin 500 MG capsule Commonly known as: KEFLEX   Lomaira 8 MG Tabs Generic drug: Phentermine HCl   naproxen 250 MG tablet Commonly known as: NAPROSYN   naproxen 500 MG tablet Commonly known as: NAPROSYN   ondansetron 4 MG disintegrating tablet Commonly known as: Zofran ODT   POTASSIUM PO   traMADol 50 MG tablet Commonly known as: ULTRAM       TAKE  these medications    ampicillin  IVPB Inject 12 g into the vein daily. As a Continuous Infusion Indication:  Bateremia/Endocardits/Paraspinal Abscess First Dose: Yes Last Day of Therapy:  08/05/21 Labs - Once weekly:  CBC/D and BMP, Labs - Every other week:  ESR and CRP Method of administration: Ambulatory Pump (Continuous Infusion) Method of administration may be changed at the discretion of home infusion pharmacist based upon assessment of the patient and/or caregiver's ability to self-administer the medication ordered.   atorvastatin 10 MG tablet Commonly  known as: LIPITOR Take 10 mg by mouth daily.   cefTRIAXone  IVPB Commonly known as: ROCEPHIN Inject 2 g into the vein every 12 (twelve) hours. Indication:  Bateremia/Endocardits/Paraspinal Abscess First Dose: Yes Last Day of Therapy:  08/05/21 Labs - Once weekly:  CBC/D and BMP, Labs - Every other week:  ESR and CRP Method of administration: IV Push Method of administration may be changed at the discretion of home infusion pharmacist based upon assessment of the patient and/or caregiver's ability to self-administer the medication ordered.   cholecalciferol 25 MCG (1000 UNIT) tablet Commonly known as: VITAMIN D3 Take 1,000 Units by mouth daily.   enalapril 10 MG tablet Commonly known as: VASOTEC Take 10 mg by mouth daily.   fluticasone 50 MCG/ACT nasal spray Commonly known as: FLONASE Place 2 sprays into both nostrils daily. What changed:  when to take this reasons to take this   furosemide 20 MG tablet Commonly known as: LASIX Take 20 mg by mouth daily.   hydrochlorothiazide 25 MG tablet Commonly known as: HYDRODIURIL Take 25 mg by mouth daily.   HYDROcodone-acetaminophen 5-325 MG tablet Commonly known as: NORCO/VICODIN Take 1 tablet by mouth every 4 (four) hours as needed for moderate pain.   HYDROcodone-acetaminophen 10-325 MG tablet Commonly known as: NORCO Take 1 tablet by mouth every 4 (four) hours as needed for severe pain.   loperamide 2 MG capsule Commonly known as: IMODIUM Take 1 capsule (2 mg total) by mouth as needed for diarrhea or loose stools.   multivitamin capsule Take 1 capsule by mouth daily.   pantoprazole 40 MG tablet Commonly known as: PROTONIX Take 40 mg by mouth daily.   tiZANidine 4 MG tablet Commonly known as: ZANAFLEX Take 4 mg by mouth 2 (two) times daily as needed (for back pain/sleep).   Vitamin C 500 MG Caps Take 500 mg by mouth daily.               Durable Medical Equipment  (From admission, onward)            Start     Ordered   07/01/21 2353  DME Gilford Rile  Once       Question Answer Comment  Walker: With [place] Wheels   Patient needs a walker to treat with the following condition Leg pain, central, unspecified laterality      07/01/21 0936   07/01/21 0926  DME 3-in-1  Once        07/01/21 6144   06/28/21 1522  For home use only DME 3 n 1  Once        06/28/21 1521   06/28/21 1522  For home use only DME Shower stool  Once       Comments: Shower seat   06/28/21 1521   06/28/21 1521  For home use only DME Walker rolling  Once       Question Answer Comment  Walker: With 5 Inch Wheels   Patient  needs a walker to treat with the following condition Weakness      06/28/21 1521              Discharge Care Instructions  (From admission, onward)           Start     Ordered   06/30/21 0000  Change dressing on IV access line weekly and PRN  (Home infusion instructions - [facility] )        06/30/21 1406           Allergies  Allergen Reactions   Baclofen Hives   Doxycycline Itching   Percocet [Oxycodone-Acetaminophen] Hives   Septra [Sulfamethoxazole-Trimethoprim] Hives   Gabapentin Rash    Follow-up Information     [name], MD Follow up on 08/11/2021.   Specialty: Cardiology Why: Please arrive 15 minutes early for your 8:40am post-hospital cardiology appointment Contact information: [place] [address] [phone]         [facility] Follow up.   Why: [phone]                 The results of significant diagnostics from this hospitalization (including imaging, microbiology, ancillary and laboratory) are listed below for reference.    Significant Diagnostic Studies: DG Orthopantogram  Result Date: 06/29/2021 CLINICAL DATA:  Poor dentition for heart valve surgery. EXAM: ORTHOPANTOGRAM/PANORAMIC COMPARISON:  None. FINDINGS: Multiple mandibular and maxillary teeth. No visible periapical abscess. No focal  mandibular abnormality. IMPRESSION: No acute findings. Electronically Signed   By: Rolm Baptise M.D.   On: 06/29/2021 13:46   MR THORACIC SPINE W WO CONTRAST  Result Date: 06/23/2021 CLINICAL DATA:  Initial evaluation for acute lower back pain, infection suspected. EXAM: MRI THORACIC AND LUMBAR SPINE WITHOUT AND WITH CONTRAST TECHNIQUE: Multiplanar and multiecho pulse sequences of the thoracic and lumbar spine were obtained without and with intravenous contrast. CONTRAST:  14m GADAVIST GADOBUTROL 1 MMOL/ML IV SOLN COMPARISON:  Prior CT from 06/23/2021. FINDINGS: MRI THORACIC SPINE FINDINGS Alignment: Physiologic with preservation of the normal thoracic kyphosis. Trace chronic retrolisthesis of T11 on T12 and T12 on L1. Vertebrae: Susceptibility artifact related to prior posterior fusion present at T10-11. Vertebral body height maintained without acute or chronic fracture. Bone marrow signal intensity within normal limits. No discrete or worrisome osseous lesions. No evidence for osteomyelitis discitis or septic arthritis within the thoracic spine. Cord: Questionable signal abnormality seen involving the distal cord at the level of T10-11 (series 22, images 32, 33), not entirely certain and could potentially be artifactual given adjacent fusion hardware. Otherwise, signal intensity within the visualized cord is normal. No abnormal enhancement. No epidural abscess or other collection. Paraspinal and other soft tissues: Chronic postoperative scarring present within the lower posterior paraspinous soft tissues. No acute paraspinous edema or inflammation. Asymmetric left renal atrophy partially visualized. Disc levels: Postsurgical changes from prior fusion at C3 through C6 noted within the cervical spine on counter sequence. T1-2: Minimal left eccentric disc bulge. Mild left-sided facet hypertrophy. No significant stenosis. T2-3: Tiny right paracentral disc protrusion mildly indents the right ventral thecal sac. No  significant spinal stenosis. Foramina remain patent. T3-4: Small right paracentral disc protrusion minimally indents the right ventral thecal sac. Mild facet hypertrophy. No significant stenosis. T4-5: Negative interspace. Mild posterior element hypertrophy. No significant stenosis. T5-6: Negative interspace. Posterior element hypertrophy. No significant stenosis. T6-7: Central disc protrusion indents the ventral thecal sac. Mild flattening of the ventral cord without cord signal changes. Prominence of  the dorsal epidural fat with mild facet hypertrophy. No significant spinal stenosis. Foramina remain patent. T7-8: Minimal disc bulge. Left greater than right facet hypertrophy with prominence of the dorsal epidural fat. No significant stenosis. T8-9: Right paracentral disc protrusion indents the ventral thecal sac. Mild flattening of the right ventral cord without cord signal changes. Mild posterior element hypertrophy with prominence of the dorsal epidural fat. No significant stenosis. T9-10: Left paracentral to foraminal disc protrusion with slight superior migration. Bilateral facet hypertrophy. No significant spinal stenosis. Mild left foraminal narrowing. Right neural foramina remains patent. T10-11: Disc bulge with tiny central disc protrusion. Prior posterior decompression and fusion. No residual spinal stenosis. Probable mild left foraminal narrowing. Right neural foramen remains patent. T11-12: Disc bulge with broad-based right paracentral disc protrusion mildly flattens the ventral thecal sac. Mild facet hypertrophy. No significant spinal stenosis. Foramina remain patent. T12-L1:  Mild disc bulge.  No significant stenosis. MRI LUMBAR SPINE FINDINGS Segmentation: Standard. Lowest well-formed disc space labeled the L5-S1 level. Alignment: Stable alignment. Trace retrolisthesis of L1 on L2, with trace anterolisthesis of L2 on L3 and L4 on L5. 4 mm anterolisthesis of L3 on L4. Vertebrae: Vertebral body height  maintained without acute or chronic fracture. Bone marrow signal intensity within normal limits. No discrete or worrisome osseous lesions. Postsurgical changes from prior posterior decompression with fusion at L1-2. Fracturing of the right transpedicular screw at L2 better seen on prior CT. Patient also status post posterior decompression at L2-3 and L3-4. There is extensive edema and enhancement involving the right posterior paraspinous soft tissues extending from the L2-3 through L4-5 levels, concerning for acute infection/cellulitis (series 11, image 5). Changes emanate from the right L3-4 and L4-5 facets, concerning for possible septic arthritis. The right L2-3 facet may be involved superiorly as well, difficult to discern given adjacent susceptibility artifact. There is a superimposed rim enhancing collection extending posteriorly from the right L4-5 facet measuring 2.4 x 1.8 x 1.6 cm, consistent with probable abscess (series 8, image 21). Associated joint effusions within the adjacent right greater than left L4-5 facets. More mild inflammatory changes seen within the contralateral left posterior paraspinous soft tissues as well. There is diffuse epidural enhancement about the lower thecal sac at the levels of L4-5 and L5-S1 without frank epidural abscess or collection (series 11, image 9). No other evidence for osteomyelitis discitis within the lumbar spine. Conus medullaris: Extends to the T12 level. No definite visible cord signal changes seen on this portion of the exam. Paraspinal and other soft tissues: Acute inflammatory changes within the right greater than left posterior paraspinous soft tissues as detailed above. Paraspinous soft tissues demonstrate no other acute finding. Asymmetric left renal atrophy noted. Disc levels: L1-2: Trace retrolisthesis with degenerative intervertebral disc space narrowing. Diffuse disc bulge with disc desiccation, eccentric to the right. Prior posterior decompression with  fusion. No residual spinal stenosis. Foramina appear grossly patent. L2-3: Degenerative intervertebral disc space narrowing with mild diffuse disc bulge. Prior posterior decompression. Residual bilateral facet arthrosis. No significant spinal stenosis. Mild right L2 foraminal narrowing. L3-4: Anterolisthesis. Degenerative intervertebral disc space narrowing with diffuse disc bulge. Prior posterior decompression. Residual bilateral facet arthrosis. No significant spinal stenosis. Mild right L3 foraminal narrowing. Left neural foramina remains patent. L4-5: Diffuse disc bulge with disc desiccation, asymmetric to the left. Prominent left-sided reactive endplate change with marginal endplate osteophytic spurring. Moderate to advanced bilateral facet arthrosis with changes concerning for septic arthritis on the right. Mild epidural enhancement without frank collection. Resultant moderate  canal with left lateral recess stenosis. Moderate left L4 foraminal narrowing. L5-S1: Mild disc bulge. Moderate bilateral facet hypertrophy. 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 Mild epidural enhancement without frank collection. No other significant spinal stenosis. Foramina remain patent. IMPRESSION: 1. Acute inflammatory changes involving the right posterior paraspinous soft tissues of the lumbar spine, extending from the L2-3 through L4-5, concerning for acute infection. Changes are in close proximity to the L3-4 and L4-5 facets, concerning for possible septic arthritis. Superimposed 2.4 x 1.8 x 1.6 cm collection extending posteriorly from the right L4-5 facet suspicious for abscess. The right L2-3 facet may be involved superiorly as well, difficult to discern given adjacent susceptibility artifact at this level. 2. Associated diffuse epidural enhancement about the lower thecal sac at the levels of L4-5 and L5-S1 without frank epidural abscess. 3. No other evidence for acute infection elsewhere within the  thoracolumbar spine. 4. Question focal cord signal abnormality involving the distal thoracic cord at the level of P50-93, not entirely certain given adjacent susceptibility artifact from fusion hardware at this level. Correlation with physical exam for possible myelopathic symptoms recommended. 5. Underlying postsurgical changes from prior posterior decompression and fusion at T10-11 and L1-2, with additional posterior decompression at L2-3 and L3-4. Fracturing of the right transpedicular screw at the level of L2 better appreciated on prior CT. 6. Underlying multilevel thoracolumbar spondylosis as detailed above, most pronounced at the L4-5 level where there is resultant moderate canal with left lateral recess stenosis, with moderate left L4 foraminal narrowing. Electronically Signed   By: Jeannine Boga M.D.   On: 06/23/2021 05:38   MR Lumbar Spine W Wo Contrast  Result Date: 06/23/2021 CLINICAL DATA:  Initial evaluation for acute lower back pain, infection suspected. EXAM: MRI THORACIC AND LUMBAR SPINE WITHOUT AND WITH CONTRAST TECHNIQUE: Multiplanar and multiecho pulse sequences of the thoracic and lumbar spine were obtained without and with intravenous contrast. CONTRAST:  110m GADAVIST GADOBUTROL 1 MMOL/ML IV SOLN COMPARISON:  Prior CT from 06/23/2021. FINDINGS: MRI THORACIC SPINE FINDINGS Alignment: Physiologic with preservation of the normal thoracic kyphosis. Trace chronic retrolisthesis of T11 on T12 and T12 on L1. Vertebrae: Susceptibility artifact related to prior posterior fusion present at T10-11. Vertebral body height maintained without acute or chronic fracture. Bone marrow signal intensity within normal limits. No discrete or worrisome osseous lesions. No evidence for osteomyelitis discitis or septic arthritis within the thoracic spine. Cord: Questionable signal abnormality seen involving the distal cord at the level of T10-11 (series 22, images 32, 33), not entirely certain and could  potentially be artifactual given adjacent fusion hardware. Otherwise, signal intensity within the visualized cord is normal. No abnormal enhancement. No epidural abscess or other collection. Paraspinal and other soft tissues: Chronic postoperative scarring present within the lower posterior paraspinous soft tissues. No acute paraspinous edema or inflammation. Asymmetric left renal atrophy partially visualized. Disc levels: Postsurgical changes from prior fusion at C3 through C6 noted within the cervical spine on counter sequence. T1-2: Minimal left eccentric disc bulge. Mild left-sided facet hypertrophy. No significant stenosis. T2-3: Tiny right paracentral disc protrusion mildly indents the right ventral thecal sac. No significant spinal stenosis. Foramina remain patent. T3-4: Small right paracentral disc protrusion minimally indents the right ventral thecal sac. Mild facet hypertrophy. No significant stenosis. T4-5: Negative interspace. Mild posterior element hypertrophy. No significant stenosis. T5-6: Negative interspace. Posterior element hypertrophy. No significant stenosis. T6-7: Central disc protrusion indents the ventral thecal sac. Mild flattening of the ventral cord without cord signal changes.  Prominence of the dorsal epidural fat with mild facet hypertrophy. No significant spinal stenosis. Foramina remain patent. T7-8: Minimal disc bulge. Left greater than right facet hypertrophy with prominence of the dorsal epidural fat. No significant stenosis. T8-9: Right paracentral disc protrusion indents the ventral thecal sac. Mild flattening of the right ventral cord without cord signal changes. Mild posterior element hypertrophy with prominence of the dorsal epidural fat. No significant stenosis. T9-10: Left paracentral to foraminal disc protrusion with slight superior migration. Bilateral facet hypertrophy. No significant spinal stenosis. Mild left foraminal narrowing. Right neural foramina remains patent.  T10-11: Disc bulge with tiny central disc protrusion. Prior posterior decompression and fusion. No residual spinal stenosis. Probable mild left foraminal narrowing. Right neural foramen remains patent. T11-12: Disc bulge with broad-based right paracentral disc protrusion mildly flattens the ventral thecal sac. Mild facet hypertrophy. No significant spinal stenosis. Foramina remain patent. T12-L1:  Mild disc bulge.  No significant stenosis. MRI LUMBAR SPINE FINDINGS Segmentation: Standard. Lowest well-formed disc space labeled the L5-S1 level. Alignment: Stable alignment. Trace retrolisthesis of L1 on L2, with trace anterolisthesis of L2 on L3 and L4 on L5. 4 mm anterolisthesis of L3 on L4. Vertebrae: Vertebral body height maintained without acute or chronic fracture. Bone marrow signal intensity within normal limits. No discrete or worrisome osseous lesions. Postsurgical changes from prior posterior decompression with fusion at L1-2. Fracturing of the right transpedicular screw at L2 better seen on prior CT. Patient also status post posterior decompression at L2-3 and L3-4. There is extensive edema and enhancement involving the right posterior paraspinous soft tissues extending from the L2-3 through L4-5 levels, concerning for acute infection/cellulitis (series 11, image 5). Changes emanate from the right L3-4 and L4-5 facets, concerning for possible septic arthritis. The right L2-3 facet may be involved superiorly as well, difficult to discern given adjacent susceptibility artifact. There is a superimposed rim enhancing collection extending posteriorly from the right L4-5 facet measuring 2.4 x 1.8 x 1.6 cm, consistent with probable abscess (series 8, image 21). Associated joint effusions within the adjacent right greater than left L4-5 facets. More mild inflammatory changes seen within the contralateral left posterior paraspinous soft tissues as well. There is diffuse epidural enhancement about the lower thecal sac  at the levels of L4-5 and L5-S1 without frank epidural abscess or collection (series 11, image 9). No other evidence for osteomyelitis discitis within the lumbar spine. Conus medullaris: Extends to the T12 level. No definite visible cord signal changes seen on this portion of the exam. Paraspinal and other soft tissues: Acute inflammatory changes within the right greater than left posterior paraspinous soft tissues as detailed above. Paraspinous soft tissues demonstrate no other acute finding. Asymmetric left renal atrophy noted. Disc levels: L1-2: Trace retrolisthesis with degenerative intervertebral disc space narrowing. Diffuse disc bulge with disc desiccation, eccentric to the right. Prior posterior decompression with fusion. No residual spinal stenosis. Foramina appear grossly patent. L2-3: Degenerative intervertebral disc space narrowing with mild diffuse disc bulge. Prior posterior decompression. Residual bilateral facet arthrosis. No significant spinal stenosis. Mild right L2 foraminal narrowing. L3-4: Anterolisthesis. Degenerative intervertebral disc space narrowing with diffuse disc bulge. Prior posterior decompression. Residual bilateral facet arthrosis. No significant spinal stenosis. Mild right L3 foraminal narrowing. Left neural foramina remains patent. L4-5: Diffuse disc bulge with disc desiccation, asymmetric to the left. Prominent left-sided reactive endplate change with marginal endplate osteophytic spurring. Moderate to advanced bilateral facet arthrosis with changes concerning for septic arthritis on the right. Mild epidural enhancement without frank collection.  Resultant moderate canal with left lateral recess stenosis. Moderate left L4 foraminal narrowing. L5-S1: Mild disc bulge. Moderate bilateral facet hypertrophy. 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 Mild epidural enhancement without frank collection. No other significant spinal stenosis. Foramina remain patent. IMPRESSION:  1. Acute inflammatory changes involving the right posterior paraspinous soft tissues of the lumbar spine, extending from the L2-3 through L4-5, concerning for acute infection. Changes are in close proximity to the L3-4 and L4-5 facets, concerning for possible septic arthritis. Superimposed 2.4 x 1.8 x 1.6 cm collection extending posteriorly from the right L4-5 facet suspicious for abscess. The right L2-3 facet may be involved superiorly as well, difficult to discern given adjacent susceptibility artifact at this level. 2. Associated diffuse epidural enhancement about the lower thecal sac at the levels of L4-5 and L5-S1 without frank epidural abscess. 3. No other evidence for acute infection elsewhere within the thoracolumbar spine. 4. Question focal cord signal abnormality involving the distal thoracic cord at the level of [id number], not entirely certain given adjacent susceptibility artifact from fusion hardware at this level. Correlation with physical exam for possible myelopathic symptoms recommended. 5. Underlying postsurgical changes from prior posterior decompression and fusion at T10-11 and L1-2, with additional posterior decompression at L2-3 and L3-4. Fracturing of the right transpedicular screw at the level of L2 better appreciated on prior CT. 6. Underlying multilevel thoracolumbar spondylosis as detailed above, most pronounced at the L4-5 level where there is resultant moderate canal with left lateral recess stenosis, with moderate left L4 foraminal narrowing. Electronically Signed   By: Jeannine Boga M.D.   On: 06/23/2021 05:38   CT ABDOMEN PELVIS W CONTRAST  Result Date: 06/23/2021 CLINICAL DATA:  Abdominal pain and fever. Numbness and tingling to the right leg since last night. No trauma. History of kidney infection. EXAM: CT ABDOMEN AND PELVIS WITH CONTRAST TECHNIQUE: Multidetector CT imaging of the abdomen and pelvis was performed using the standard protocol following bolus administration of  intravenous contrast. CONTRAST:  82m OMNIPAQUE IOHEXOL 350 MG/ML SOLN COMPARISON:  06/19/2021 FINDINGS: Lower chest: Lung bases are clear. Hepatobiliary: No focal liver abnormality is seen. Status post cholecystectomy. No biliary dilatation. Pancreas: Unremarkable. No pancreatic ductal dilatation or surrounding inflammatory changes. Spleen: Normal in size without focal abnormality. Adrenals/Urinary Tract: No adrenal gland nodules. Bilateral renal parenchymal scarring or lobulation with parenchymal atrophy involving the left kidney. No solid mass lesion identified. No hydronephrosis or hydroureter. Bladder is unremarkable. Stomach/Bowel: Stomach, small bowel, and colon are not abnormally distended. No wall thickening or inflammatory changes are appreciated. Appendix is normal. Vascular/Lymphatic: No significant vascular findings are present. No enlarged abdominal or pelvic lymph nodes. Reproductive: Status post hysterectomy. No adnexal masses. Other: No free air or free fluid in the abdomen. Abdominal wall musculature appears intact. Musculoskeletal: Postoperative changes with plate and screw fixations from T10-T11 and L1-L2. The pedicular screw at L2 on the right is broken. This may have progressed since the previous study. Degenerative changes and mild scoliosis of the lumbar spine with convexity towards the left. IMPRESSION: 1. Asymmetric atrophy of the left kidney. 2. No acute abnormalities demonstrated in the abdomen or pelvis. No bowel obstruction or inflammation. No abscess identified. 3. Postoperative changes in the lumbar spine. The right L2 pedicular screw is broken, possibly progressed since prior study. Electronically Signed   By: WLucienne CapersM.D.   On: 06/23/2021 00:25   CT ABDOMEN PELVIS W CONTRAST  Result Date: 06/19/2021 CLINICAL DATA:  Right lower quadrant pain since this morning  EXAM: CT ABDOMEN AND PELVIS WITH CONTRAST TECHNIQUE: Multidetector CT imaging of the abdomen and pelvis was  performed using the standard protocol following bolus administration of intravenous contrast. CONTRAST:  61m OMNIPAQUE IOHEXOL 350 MG/ML SOLN COMPARISON:  07/25/2019 FINDINGS: Lower chest:  No contributory findings. Hepatobiliary: No focal liver abnormality.Cholecystectomy. No bile duct dilatation. Pancreas: Unremarkable. Spleen: Unremarkable. Adrenals/Urinary Tract: Negative adrenals. Lobulated renal cortex from scarring, left worse than right. Unremarkable bladder. Stomach/Bowel: No obstruction. No bowel inflammation, including appendicitis. Vascular/Lymphatic: No acute vascular abnormality. No mass or adenopathy. Reproductive:Hysterectomy Other: No ascites or pneumoperitoneum. Musculoskeletal: No acute abnormalities. Generalized lumbar spine degeneration with decompressions and thoracolumbar posterior-lateral fusions. Interval right-sided pedicle screw fracture noted at L2, with no L1-2 solid arthrodesis demonstrated. IMPRESSION: 1. No acute finding.  No appendicitis or urolithiasis. 2. 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 3. Posterior-lateral fusion at L1-2 with interval right L2 pedicle screw fracture and lack of bridging bone, possible pseudoarthrosis. Electronically Signed   By: JJorje GuildM.D.   On: 06/19/2021 04:18   IR Fluoro Guide CV Line Right  Result Date: 07/01/2021 INDICATION: Patient with Enterococcus faecalis endocarditis, septic arthritis with spinal abscess, poor venous access. Request to IR for PICC placement for long-term IV antibiotic therapy. EXAM: RIGHT UPPER EXTREMITY PICC LINE PLACEMENT WITH ULTRASOUND AND FLUOROSCOPIC GUIDANCE MEDICATIONS: 4 mL 1% lidocaine ANESTHESIA/SEDATION: None. FLUOROSCOPY TIME:  Fluoroscopy Time: 0 minutes 24 seconds (2.8 mGy). COMPLICATIONS: None immediate. PROCEDURE: The patient was advised of the possible risks and complications and agreed to undergo the procedure. The patient was then brought to the angiographic suite for the procedure. The  right arm was prepped with chlorhexidine, draped in the usual sterile fashion using maximum barrier technique (cap and mask, sterile gown, sterile gloves, large sterile sheet, hand hygiene and cutaneous antisepsis) and infiltrated locally with 1% Lidocaine. Ultrasound demonstrated patency of the right basilic vein, and this was documented with an image. Under real-time ultrasound guidance, this vein was accessed with a 21 gauge micropuncture needle and image documentation was performed. A 0.018 wire was introduced in to the vein. Over this, a 5 FPakistansingle lumen power-injectable PICC was advanced to the lower SVC/right atrial junction. Fluoroscopy during the procedure and fluoro spot radiograph confirms appropriate catheter position. The catheter was flushed and covered with a sterile dressing. Catheter length: 46 cm cm IMPRESSION: Successful right arm Power PICC line placement with ultrasound and fluoroscopic guidance. The catheter is ready for use. Read by SCandiss Norse PA-C Electronically Signed   By: AMarkus DaftM.D.   On: 06/30/2021 16:21   IR Fluoro Guide Ndl Plmt / BX  Result Date: 06/23/2021 INDICATION: 50year old female with past medical history significant for hypertension, nephrolithiasis and morbid obesity with history of recent L1-2 posterior spinal fusion. She presented to ED on 06/22/2021 complaining of back pain, right lower extremity radiculopathy, nausea/vomiting and fever. MRI of the lumbar spine reveal a right paraspinal abscess at the L4-5 level, adjacent to the corresponding facet joint. She comes to our service today for a fluoroscopy guided fine-needle aspiration. EXAM: FLUOROSCOPY GUIDED RIGHT PARASPINAL ABSCESS FINE-NEEDLE ASPIRATION MEDICATIONS: The patient is currently admitted to the hospital and receiving intravenous antibiotics. The antibiotics were administered within an appropriate time frame prior to the initiation of the procedure. ANESTHESIA/SEDATION: The procedure was  performed under local anesthesia COMPLICATIONS: None immediate. FLUOROSCOPY TIME:  36 seconds (DAP 5 mGy) PROCEDURE: Informed verbal consent was obtained from the patient, following COVID 19 guidelines, after a thorough discussion of the  procedural risks, benefits and alternatives. All questions were addressed. Maximal Sterile Barrier Technique was utilized including caps, mask, sterile gowns, sterile gloves, sterile drape, hand hygiene and skin antiseptic. A timeout was performed prior to the initiation of the procedure. The patient was made to lie prone on the angiography table. The skin in the lower back was prepped and draped in a sterile fashion. The L4-5 level was marked under fluoroscopy. The skin was infiltrated with lidocaine 1% approximally 2 cm to the right of midline at the L4-5 level. Subsequently, a 20 gauge needle was advanced through the soft tissues for approximately 5 cm. Then, approximately 4 cc of blood-tinged purulent fluid was aspirated. The fluid was sent in capped syringes to the laboratory for Gram stain and culture. The needle was subsequently withdrawn. The skin was cleaned and dressed with a sterile bandage. IMPRESSION: Successful and uncomplicated fluoroscopy guided fine-needle aspiration of a right posterior paraspinal abscess at the L4-5 level. Electronically Signed   By: Pedro Earls M.D.   On: 06/23/2021 15:25   CT L-SPINE NO CHARGE  Result Date: 06/23/2021 CLINICAL DATA:  Initial evaluation for low back pain with right lower extremity numbness and tingling. Fevers. EXAM: CT LUMBAR SPINE WITHOUT CONTRAST TECHNIQUE: Multidetector CT imaging of the lumbar spine was performed without intravenous contrast administration. Multiplanar CT image reconstructions were also generated. COMPARISON:  MRI from 10/01/2019. FINDINGS: Segmentation: Standard. Lowest well-formed disc space labeled the L5-S1 level. Alignment: Sigmoid scoliotic curvature of the lumbar spine. Trace  retrolisthesis of T12 on L1 and L1 on L2, with trace anterolisthesis of L2 on L3. 4 mm anterolisthesis of L3 on L4. Vertebrae: Vertebral body height maintained without acute or chronic fracture. Visualized sacrum and pelvis intact. No findings to suggest osteomyelitis discitis or septic arthritis. SI joints symmetric and within normal limits. Patient is status post posterior fusion at L1-2 with bilateral transpedicular screws in place. No periprosthetic lucency to suggest loosening or infection. The right-sided transpedicular screw at L2 is fractured (series 13, image 37). Hardware otherwise intact. Sequelae of prior screw removal noted at L3 and L4. Paraspinal and other soft tissues: Chronic postoperative changes present within the posterior paraspinous soft tissues. No visible acute paraspinous inflammatory changes. No loculated collections. Partially visualized abdominal viscera better evaluated on concomitant CT of the abdomen and pelvis. Disc levels: L1-2: Trace retrolisthesis with degenerative intervertebral disc space narrowing and diffuse disc bulge. Prior posterior decompression and fusion. No residual spinal stenosis. No more than mild bilateral L1 foraminal narrowing. L2-3: Degenerative intervertebral disc space narrowing with diffuse disc bulge, slightly eccentric to the right. Prior posterior decompression. Moderate bilateral facet arthrosis. No significant residual spinal stenosis. Moderate right worse than left L2 foraminal narrowing. L3-4: 4 mm anterolisthesis. Degenerative intervertebral disc space narrowing with right eccentric disc bulge. Superimposed right foraminal to extraforaminal disc protrusion contacts the exiting right L3 nerve root (series 10, image 59). Moderate bilateral facet arthrosis. Prior posterior decompression. No definite residual spinal stenosis. Mild left with moderate right L3 foraminal narrowing. L4-5: Degenerative intervertebral disc space narrowing with diffuse disc bulge  and mild disc desiccation. Disc bulging appears to be asymmetric to the right. Mild reactive endplate spurring. Moderate facet and ligament flavum hypertrophy. Prior right hemi laminectomy. Persistent moderate to severe canal with bilateral lateral recess stenosis. Moderate left worse than right L4 foraminal stenosis. L5-S1: Shallow broad-based central disc protrusion with mild annular calcification (series 10, image 90). Bulging disc closely approximates both of the descending S1 nerve roots without  frank impingement. Moderate bilateral facet arthrosis. No significant spinal stenosis. Mild-to-moderate bilateral L5 foraminal narrowing. IMPRESSION: 1. No CT evidence for acute abnormality within the lumbar spine. 2. Postoperative changes from prior posterior decompression and fusion at L1-2 without residual spinal stenosis. The right transpedicular screw at L2 is fractured. No periprosthetic lucency to suggest loosening or infection. 3. Right foraminal to extraforaminal disc protrusion at L3-4, contacting and potentially affecting the exiting right L3 nerve root. 4. Multifactorial degenerative changes at L4-5 with resultant moderate to severe canal with bilateral lateral recess stenosis, with moderate left worse than right L4 foraminal stenosis. 5. Additional mild-to-moderate foraminal narrowing elsewhere within the lumbar spine at L2, L3, and L5 as above. Electronically Signed   By: Jeannine Boga M.D.   On: 06/23/2021 01:02   Portable chest 1 View  Result Date: 06/23/2021 CLINICAL DATA:  Shortness of breath EXAM: PORTABLE CHEST 1 VIEW COMPARISON:  Chest radiograph 04/22/2020 FINDINGS: The cardiomediastinal silhouette is normal. Lung volumes are mildly low. There is no focal consolidation or pulmonary edema. There is no pleural effusion or pneumothorax. There is no acute osseous abnormality. Spinal fusion hardware is noted. IMPRESSION: Low lung volumes. Otherwise, no radiographic evidence of acute  cardiopulmonary process. Electronically Signed   By: Valetta Mole M.D.   On: 06/23/2021 10:08   ECHO TEE  Result Date: 06/28/2021    TRANSESOPHOGEAL ECHO REPORT   Patient Name:   LEATHA ROHNER Date of Exam: 06/28/2021 Medical Rec #:  [id number]                 Height:       67.0 in Accession #:    [id number]                Weight:       266.0 lb Date of Birth:  30-Jul-1971                 BSA:          2.283 m Patient Age:    50 years                  BP:           120/67 mmHg Patient Gender: F                         HR:           104 bpm. Exam Location:  Inpatient Procedure: Cardiac Doppler, Color Doppler and Transesophageal Echo Indications:     Bacteremia  History:         Patient has prior history of Echocardiogram examinations, most                  recent 06/26/2021.  Sonographer:     Merrie Roof RDCS Referring Phys:  [id number] Abigail Butts Diagnosing Phys: Kirk Ruths MD PROCEDURE: After discussion of the risks and benefits of a TEE, an informed consent was obtained from the patient. The transesophogeal probe was passed without difficulty through the esophogus of the patient. Local oropharyngeal anesthetic was provided with Cetacaine. Sedation performed by different physician. The patient was monitored while under deep sedation. The patient's vital signs; including heart rate, blood pressure, and oxygen saturation; remained stable throughout the procedure. The patient developed no complications during the procedure. IMPRESSIONS  1. Bicuspid aortic valve (left and right cusps appear to be fused); probable vegetation noted; severe AI.  2. Left ventricular ejection fraction,  by estimation, is 60 to 65%. The left ventricle has normal function. The left ventricle has no regional wall motion abnormalities.  3. Right ventricular systolic function is normal. The right ventricular size is normal.  4. No left atrial/left atrial appendage thrombus was detected.  5. The mitral valve is normal in  structure. No evidence of mitral valve regurgitation.  6. The aortic valve is bicuspid. Aortic valve regurgitation is severe. FINDINGS  Left Ventricle: Left ventricular ejection fraction, by estimation, is 60 to 65%. The left ventricle has normal function. The left ventricle has no regional wall motion abnormalities. The left ventricular internal cavity size was normal in size. Right Ventricle: The right ventricular size is normal. Right ventricular systolic function is normal. Left Atrium: Left atrial size was normal in size. No left atrial/left atrial appendage thrombus was detected. Right Atrium: Right atrial size was normal in size. Pericardium: There is no evidence of pericardial effusion. Mitral Valve: The mitral valve is normal in structure. No evidence of mitral valve regurgitation. Tricuspid Valve: The tricuspid valve is normal in structure. Tricuspid valve regurgitation is trivial. Aortic Valve: The aortic valve is bicuspid. Aortic valve regurgitation is severe. Pulmonic Valve: The pulmonic valve was normal in structure. Pulmonic valve regurgitation is trivial. Aorta: The aortic root is normal in size and structure. There is minimal (Grade I) plaque involving the descending aorta. IAS/Shunts: No atrial level shunt detected by color flow Doppler. Additional Comments: Bicuspid aortic valve (left and right cusps appear to be fused); probable vegetation noted; severe AI. Kirk Ruths MD Electronically signed by Kirk Ruths MD Signature Date/Time: 06/28/2021/3:42:19 PM    Final    ECHOCARDIOGRAM LIMITED  Result Date: 06/26/2021    ECHOCARDIOGRAM LIMITED REPORT   Patient Name:   ELLIN FITZGIBBONS Date of Exam: 06/26/2021 Medical Rec #:  [id number]                 Height:       67.0 in Accession #:    [id number]                Weight:       266.0 lb Date of Birth:  08-11-1971                 BSA:          2.283 m Patient Age:    8 years                  BP:           108/51 mmHg Patient Gender: F                          HR:           102 bpm. Exam Location:  Inpatient Procedure: Limited Echo, Color Doppler and Cardiac Doppler Indications:    Bacteremia R78.81  History:        Patient has prior history of Echocardiogram examinations, most                 recent 05/26/2020. Risk Factors:Hypertension. Prior performed at                 [place]:    [place] Referring Phys: [phone] GREGORY D CALONE  Sonographer Comments: COVID+ at time of study IMPRESSIONS  1. Poor quality study bad acoustic windows limited.  2. Left ventricular ejection fraction, by estimation, is 55 to 60%. The  left ventricle has normal function. Left ventricular diastolic parameters are indeterminate.  3. The mitral valve was not well visualized. No evidence of mitral valve regurgitation.  4. AV not well seen Some calcification and ? mild to moderate AR Cannot r/o vegetation Suggest TEE if clinically indicated . The aortic valve was not well visualized.  5. The inferior vena cava is normal in size with greater than 50% respiratory variability, suggesting right atrial pressure of 3 mmHg. FINDINGS  Left Ventricle: Left ventricular ejection fraction, by estimation, is 55 to 60%. The left ventricle has normal function. The left ventricular internal cavity size was normal in size. Left ventricular diastolic parameters are indeterminate. Pericardium: There is no evidence of pericardial effusion. Mitral Valve: The mitral valve was not well visualized. Tricuspid Valve: The tricuspid valve is not well visualized. Aortic Valve: AV not well seen Some calcification and ? mild to moderate AR Cannot r/o vegetation Suggest TEE if clinically indicated. The aortic valve was not well visualized. Pulmonic Valve: The pulmonic valve was not well visualized. Venous: The inferior vena cava is normal in size with greater than 50% respiratory variability, suggesting right atrial pressure of 3 mmHg. Additional Comments: Poor quality study bad  acoustic windows limited. AORTIC VALVE LVOT Vmax:   79.80 cm/s LVOT Vmean:  54.200 cm/s LVOT VTI:    0.152 m  SHUNTS Systemic VTI: 0.15 m Jenkins Rouge MD Electronically signed by Jenkins Rouge MD Signature Date/Time: 06/26/2021/3:42:56 PM    Final    [place] EKG SITE RITE  Result Date: 06/29/2021 If Site Rite image not attached, placement could not be confirmed due to current cardiac rhythm.   Microbiology: Recent Results (from the past 240 hour(s))  Resp Panel by RT-PCR (Flu A&B, Covid) Nasopharyngeal Swab     Status: Abnormal   Collection Time: 06/22/21 10:57 PM   Specimen: Nasopharyngeal Swab; Nasopharyngeal(NP) swabs in vial transport medium  Result Value Ref Range Status   SARS Coronavirus 2 by RT PCR POSITIVE (A) NEGATIVE Final    Comment: RESULT CALLED TO, READ BACK BY AND VERIFIED WITH: Burgess Amor RN AT 0006 06/23/21 [place] (NOTE) SARS-CoV-2 target nucleic acids are DETECTED.  The SARS-CoV-2 RNA is generally detectable in upper respiratory specimens during the acute phase of infection. Positive results are indicative of the presence of the identified virus, but do not rule out bacterial infection or co-infection with other pathogens not detected by the test. Clinical correlation with patient history and other diagnostic information is necessary to determine patient infection status. The expected result is Negative.  Fact Sheet for Patients: EntrepreneurPulse.com.au  Fact Sheet for Healthcare Providers: IncredibleEmployment.be  This test is not yet approved or cleared by the [place] FDA and  has been authorized for detection and/or diagnosis of SARS-CoV-2 by FDA under an Emergency Use Authorization (EUA).  This EUA will remain in effect (meaning this test can be  used) for the duration of  the COVID-19 declaration under Section 564(b)(1) of the Act, 21 U.S.C. section 360bbb-3(b)(1), unless the authorization is terminated or revoked sooner.      Influenza A by PCR NEGATIVE NEGATIVE Final   Influenza B by PCR NEGATIVE NEGATIVE Final    Comment: (NOTE) The Xpert Xpress SARS-CoV-2/FLU/RSV plus assay is intended as an aid in the diagnosis of influenza from Nasopharyngeal swab specimens and should not be used as a sole basis for treatment. Nasal washings and aspirates are unacceptable for Xpert Xpress SARS-CoV-2/FLU/RSV testing.  Fact Sheet for Patients: EntrepreneurPulse.com.au  Fact Sheet for  Healthcare Providers: IncredibleEmployment.be  This test is not yet approved or cleared by the [place] and has been authorized for detection and/or diagnosis of SARS-CoV-2 by FDA under an Emergency Use Authorization (EUA). This EUA will remain in effect (meaning this test can be used) for the duration of the COVID-19 declaration under Section 564(b)(1) of the Act, 21 U.S.C. section 360bbb-3(b)(1), unless the authorization is terminated or revoked.  Performed at [facility], [address] [place], [place] [address]   Blood culture (routine x 2)     Status: Abnormal   Collection Time: 06/22/21 11:22 PM   Specimen: BLOOD  Result Value Ref Range Status   Specimen Description   Final    BLOOD BOTTLES DRAWN AEROBIC AND ANAEROBIC Performed at [facility], [address], [place], [place] [address]    Special Requests   Final    Blood Culture adequate volume RIGHT ANTECUBITAL Performed at [facility], [place], [place] [address]    Culture  Setup Time   Final    GRAM POSITIVE COCCI IN BOTH AEROBIC AND ANAEROBIC BOTTLES CRITICAL RESULT CALLED TO, READ BACK BY AND VERIFIED WITH: PHARMD [place] PHAM 06/23/21_0 :25 BY TW Performed at [facility], [place] [address] [place],  [address]    Culture ENTEROCOCCUS FAECALIS (A)  Final   Report Status 06/26/2021 FINAL  Final   Organism ID, Bacteria ENTEROCOCCUS  FAECALIS  Final      Susceptibility   Enterococcus faecalis - MIC*    AMPICILLIN <=2 SENSITIVE Sensitive     VANCOMYCIN 1 SENSITIVE Sensitive     GENTAMICIN SYNERGY SENSITIVE Sensitive     * ENTEROCOCCUS FAECALIS  Blood Culture ID Panel (Reflexed)     Status: Abnormal   Collection Time: 06/22/21 11:22 PM  Result Value Ref Range Status   Enterococcus faecalis DETECTED (A) NOT DETECTED Final    Comment: CRITICAL RESULT CALLED TO, READ BACK BY AND VERIFIED WITH: PHARMD [place] PHAM 06/23/21_1 :25 BY TW    Enterococcus Faecium NOT DETECTED NOT DETECTED Final   Listeria monocytogenes NOT DETECTED NOT DETECTED Final   Staphylococcus species NOT DETECTED NOT DETECTED Final   Staphylococcus aureus (BCID) NOT DETECTED NOT DETECTED Final   Staphylococcus epidermidis NOT DETECTED NOT DETECTED Final   Staphylococcus lugdunensis NOT DETECTED NOT DETECTED Final   Streptococcus species NOT DETECTED NOT DETECTED Final   Streptococcus agalactiae NOT DETECTED NOT DETECTED Final   Streptococcus pneumoniae NOT DETECTED NOT DETECTED Final   Streptococcus pyogenes NOT DETECTED NOT DETECTED Final   A.calcoaceticus-baumannii NOT DETECTED NOT DETECTED Final   Bacteroides fragilis NOT DETECTED NOT DETECTED Final   Enterobacterales NOT DETECTED NOT DETECTED Final   Enterobacter cloacae complex NOT DETECTED NOT DETECTED Final   Escherichia coli NOT DETECTED NOT DETECTED Final   Klebsiella aerogenes NOT DETECTED NOT DETECTED Final   Klebsiella oxytoca NOT DETECTED NOT DETECTED Final   Klebsiella pneumoniae NOT DETECTED NOT DETECTED Final   Proteus species NOT DETECTED NOT DETECTED Final   Salmonella species NOT DETECTED NOT DETECTED Final   Serratia marcescens NOT DETECTED NOT DETECTED Final   Haemophilus influenzae NOT DETECTED NOT DETECTED Final   Neisseria meningitidis NOT DETECTED NOT DETECTED Final   Pseudomonas aeruginosa NOT DETECTED NOT DETECTED Final   Stenotrophomonas maltophilia NOT DETECTED NOT  DETECTED Final   Candida albicans NOT DETECTED NOT DETECTED Final   Candida auris NOT DETECTED NOT DETECTED Final   Candida glabrata NOT DETECTED NOT DETECTED Final  Candida krusei NOT DETECTED NOT DETECTED Final   Candida parapsilosis NOT DETECTED NOT DETECTED Final   Candida tropicalis NOT DETECTED NOT DETECTED Final   Cryptococcus neoformans/gattii NOT DETECTED NOT DETECTED Final   Vancomycin resistance NOT DETECTED NOT DETECTED Final    Comment: Performed at [facility], [place] [address], [place], [place] [address]  Blood culture (routine x 2)     Status: Abnormal   Collection Time: 06/22/21 11:35 PM   Specimen: BLOOD LEFT WRIST  Result Value Ref Range Status   Specimen Description BLOOD LEFT WRIST  Final   Special Requests   Final    BOTTLES DRAWN AEROBIC AND ANAEROBIC Blood Culture adequate volume   Culture  Setup Time   Final    GRAM POSITIVE COCCI IN BOTH AEROBIC AND ANAEROBIC BOTTLES CRITICAL VALUE NOTED.  VALUE IS CONSISTENT WITH PREVIOUSLY REPORTED AND CALLED VALUE.    Culture (A)  Final    ENTEROCOCCUS FAECALIS SUSCEPTIBILITIES PERFORMED ON PREVIOUS CULTURE WITHIN THE LAST 5 DAYS. Performed at [facility], [place] [address] [place], [place] [address]    Report Status 06/26/2021 FINAL  Final  Aerobic/Anaerobic Culture w Gram Stain (surgical/deep wound)     Status: None   Collection Time: 06/23/21  2:52 PM   Specimen: Back; Tissue  Result Value Ref Range Status   Specimen Description ABSCESS  Final   Special Requests PARASPINAL L4 L5  Final   Gram Stain   Final    MODERATE WBC PRESENT,BOTH PMN AND MONONUCLEAR NO ORGANISMS SEEN    Culture   Final    RARE ENTEROCOCCUS FAECALIS NO ANAEROBES ISOLATED Performed at [facility], [address], [place], [place] [address]    Report Status 06/28/2021 FINAL  Final   Organism ID, Bacteria ENTEROCOCCUS FAECALIS  Final      Susceptibility   Enterococcus faecalis - MIC*    AMPICILLIN <=2 SENSITIVE  Sensitive     VANCOMYCIN 1 SENSITIVE Sensitive     GENTAMICIN SYNERGY SENSITIVE Sensitive     * RARE ENTEROCOCCUS FAECALIS  Culture, blood (routine x 2)     Status: None   Collection Time: 06/24/21  3:44 PM   Specimen: BLOOD  Result Value Ref Range Status   Specimen Description BLOOD LEFT ANTECUBITAL  Final   Special Requests   Final    BOTTLES DRAWN AEROBIC AND ANAEROBIC Blood Culture adequate volume   Culture   Final    NO GROWTH 5 DAYS Performed at [facility], [address], [place], [place] [address]    Report Status 06/29/2021 FINAL  Final  Culture, blood (routine x 2)     Status: None   Collection Time: 06/24/21  4:00 PM   Specimen: BLOOD LEFT HAND  Result Value Ref Range Status   Specimen Description BLOOD LEFT HAND  Final   Special Requests   Final    BOTTLES DRAWN AEROBIC ONLY Blood Culture adequate volume   Culture   Final    NO GROWTH 5 DAYS Performed at [facility], [place] [address], [place], [place] [address]    Report Status 06/29/2021 FINAL  Final     Labs: Basic Metabolic Panel: Recent Labs  Lab 06/24/21 1543 06/25/21 0359 06/26/21 0110 06/27/21 0030 06/28/21 0347 06/29/21 0403  NA 138  --  139 140 136 136  K 3.5  --  3.5 3.4* 3.9 3.7  CL 104  --  103 100 103 102  CO2 26  --  _0 GLUCOSE 131*  --  119* 87 95 89  BUN 7  --  _1 CREATININE 0.77  --  0.87 0.92 0.69 0.80  CALCIUM 8.3*  --  8.3* 8.9 8.3* 8.3*  MG  --  1.8 1.6* 2.0 1.9 1.9  PHOS  --  3.2 3.0 3.2 3.7 3.5   Liver Function Tests: Recent Labs  Lab 06/24/21 1543 06/26/21 0110 06/27/21 0030 06/28/21 0347 06/29/21 0403  AST _2 ALT 40 32 33 24 20  ALKPHOS 105 92 105 85 80  BILITOT 0.4 0.4 0.5 0.3 0.5  PROT 6.4* 6.4* 8.3* 6.4* 6.6  ALBUMIN 2.2* 2.2* 2.7* 2.2* 2.3*   No results for input(s): LIPASE, AMYLASE in the last 168 hours. No results for input(s): AMMONIA in the last 168 hours. CBC: Recent Labs  Lab 06/24/21 1543 06/26/21 0110  06/27/21 0831 06/28/21 0347 06/29/21 0403  WBC 7.5 8.0 7.2 8.9 8.0  NEUTROABS 4.6 4.2 4.2 5.1 4.5  HGB 10.6* 10.2* 11.4* 11.0* 10.9*  HCT 33.5* 32.4* 36.3 34.4* 34.6*  MCV 85.9 85.7 85.6 84.7 85.0  PLT 263 311 312 371 392   Cardiac Enzymes: No results for input(s): CKTOTAL, CKMB, CKMBINDEX, TROPONINI in the last 168 hours. BNP: BNP (last 3 results) No results for input(s): BNP in the last 8760 hours.  ProBNP (last 3 results) No results for input(s): PROBNP in the last 8760 hours.  CBG: No results for input(s): GLUCAP in the last 168 hours.     Signed:  Nita Sells MD   [facility] 07/01/2021, 9:36 AM

## 2021-07-01 NOTE — Plan of Care (Signed)

## 2021-07-01 NOTE — Care Management (Signed)
ED CM contacted concerning patient needing DME for discharge home tonight. CM explained that equipment can be ordered tonight and delivered to her home sometime tomorrow. Referral sent in to the after hour referral line at [facility].

## 2021-07-01 NOTE — Progress Notes (Signed)
Subjective: No acute events overnight.   Objective: Vital signs in last 24 hours: Temp:  [97.5 F (36.4 C)-98.6 F (37 C)] 98.6 F (37 C) (09/30 0621) Pulse Rate:  [90-98] 90 (09/30 0621) Resp:  [14-16] 16 (09/30 0621) BP: (106-119)/(52-68) 106/56 (09/30 0621) SpO2:  [93 %-95 %] 93 % (09/30 0621)  Intake/Output from previous day: 09/29 0701 - 09/30 0700 In: 1160 [P.O.:360; IV Piggyback:800] Out: 950 [Urine:950] Intake/Output this shift: No intake/output data recorded.  Physical Exam: Patient is awake, A/O X 4, and conversant. She is laying comfortably in bed in NAD. VSS. Speech is fluent and appropriate. MAEW with good strength. Sensation to light touch is intact. PERLA, EOMI. CNs grossly intact.  Lab Results: Recent Labs    06/29/21 0403  WBC 8.0  HGB 10.9*  HCT 34.6*  PLT 392   BMET Recent Labs    06/29/21 0403  NA 136  K 3.7  CL 102  CO2 27  GLUCOSE 89  BUN 7  CREATININE 0.80  CALCIUM 8.3*    Studies/Results: DG Orthopantogram  Result Date: 06/29/2021 CLINICAL DATA:  Poor dentition for heart valve surgery. EXAM: ORTHOPANTOGRAM/PANORAMIC COMPARISON:  None. FINDINGS: Multiple mandibular and maxillary teeth. No visible periapical abscess. No focal mandibular abnormality. IMPRESSION: No acute findings. Electronically Signed   By: Rolm Baptise M.D.   On: 06/29/2021 13:46   IR Fluoro Guide CV Line Right  Result Date: 07/01/2021 INDICATION: Patient with Enterococcus faecalis endocarditis, septic arthritis with spinal abscess, poor venous access. Request to IR for PICC placement for long-term IV antibiotic therapy. EXAM: RIGHT UPPER EXTREMITY PICC LINE PLACEMENT WITH ULTRASOUND AND FLUOROSCOPIC GUIDANCE MEDICATIONS: 4 mL 1% lidocaine ANESTHESIA/SEDATION: None. FLUOROSCOPY TIME:  Fluoroscopy Time: 0 minutes 24 seconds (2.8 mGy). COMPLICATIONS: None immediate. PROCEDURE: The patient was advised of the possible risks and complications and agreed to undergo the procedure.  The patient was then brought to the angiographic suite for the procedure. The right arm was prepped with chlorhexidine, draped in the usual sterile fashion using maximum barrier technique (cap and mask, sterile gown, sterile gloves, large sterile sheet, hand hygiene and cutaneous antisepsis) and infiltrated locally with 1% Lidocaine. Ultrasound demonstrated patency of the right basilic vein, and this was documented with an image. Under real-time ultrasound guidance, this vein was accessed with a 21 gauge micropuncture needle and image documentation was performed. A 0.018 wire was introduced in to the vein. Over this, a 5 [place] single lumen power-injectable PICC was advanced to the lower SVC/right atrial junction. Fluoroscopy during the procedure and fluoro spot radiograph confirms appropriate catheter position. The catheter was flushed and covered with a sterile dressing. Catheter length: 46 cm cm IMPRESSION: Successful right arm Power PICC line placement with ultrasound and fluoroscopic guidance. The catheter is ready for use. Read by Candiss Norse, PA-C Electronically Signed   By: Markus Daft M.D.   On: 06/30/2021 16:21   [place] EKG SITE RITE  Result Date: 06/29/2021 If Site Rite image not attached, placement could not be confirmed due to current cardiac rhythm.   Assessment/Plan: 50 y.o. female with acute inflammatory changes in the right paraspinous tissue of the lumbar spine with L2-L5 septic arthritis. Incidentally found to be positive for COVID 19. 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 CT on board and planning on heart cath with repeat TEE after completion of 6 weeks abx therapy to determine surgery needs, if any indicated. Prior thoracic spinal surgery should not affect surgery for heart  valve if needed. Her neurological examination remains at baseline. Her pain is slowly improving. Continue abx per ID. PICC placement by IR yesterday. No new neurosurgical recommendations at  this time. Call with any questions.    LOS: 8 days     Marvis Moeller, DNP, NP-C 07/01/2021, 8:53 AM

## 2021-07-01 NOTE — Procedures (Signed)
Successful exchange of single lumen to dual lumen PICC line to right basilic vein. Length 46cm Tip at lower SVC/RA PICC flushed, aspirated, heparin locked, capped, dressed. No complications Ready for use.  EBL < 5 mL  Please see imaging section of Epic for full dictation.   Mikiah Durall PA-C 07/01/2021 3:05 PM

## 2021-07-01 NOTE — Progress Notes (Signed)
Pt discharge to home with PICC line, discharge intructions given, homehealth nurse also present to set up IV abt. Son was also present to receive discharge instructions.

## 2021-07-06 ENCOUNTER — Encounter (HOSPITAL_COMMUNITY): Payer: Self-pay

## 2021-07-06 NOTE — Care Management (Signed)
Received a message patient has not received 3 in 1 and shower chair. Called Freda Munro with [facility] , patient's account is on hold. NCM asked [facility] to call patient to discuss.

## 2021-07-14 ENCOUNTER — Emergency Department (HOSPITAL_COMMUNITY)
Admission: EM | Admit: 2021-07-14 | Discharge: 2021-07-15 | Disposition: A | Discharge status: Home / Self Care | Payer: 59 | Attending: Emergency Medicine | Admitting: Emergency Medicine

## 2021-07-14 ENCOUNTER — Emergency Department (HOSPITAL_COMMUNITY): Payer: 59

## 2021-07-14 ENCOUNTER — Encounter (HOSPITAL_COMMUNITY): Payer: Self-pay | Admitting: *Deleted

## 2021-07-14 DIAGNOSIS — I10 Essential (primary) hypertension: Secondary | ICD-10-CM | POA: Insufficient documentation

## 2021-07-14 DIAGNOSIS — R0602 Shortness of breath: Secondary | ICD-10-CM | POA: Diagnosis not present

## 2021-07-14 DIAGNOSIS — R0789 Other chest pain: Secondary | ICD-10-CM | POA: Insufficient documentation

## 2021-07-14 DIAGNOSIS — Z79899 Other long term (current) drug therapy: Secondary | ICD-10-CM | POA: Insufficient documentation

## 2021-07-14 DIAGNOSIS — R079 Chest pain, unspecified: Secondary | ICD-10-CM

## 2021-07-14 DIAGNOSIS — Z8616 Personal history of COVID-19: Secondary | ICD-10-CM | POA: Insufficient documentation

## 2021-07-14 LAB — CBC WITH DIFFERENTIAL/PLATELET
Abs Immature Granulocytes: 0.02 10*3/uL (ref 0.00–0.07)
Basophils Absolute: 0.1 10*3/uL (ref 0.0–0.1)
Basophils Relative: 1 %
Eosinophils Absolute: 0.4 10*3/uL (ref 0.0–0.5)
Eosinophils Relative: 5 %
HCT: 37 % (ref 36.0–46.0)
Hemoglobin: 11.6 g/dL — ABNORMAL LOW (ref 12.0–15.0)
Immature Granulocytes: 0 %
Lymphocytes Relative: 35 %
Lymphs Abs: 2.5 10*3/uL (ref 0.7–4.0)
MCH: 26.9 pg (ref 26.0–34.0)
MCHC: 31.4 g/dL (ref 30.0–36.0)
MCV: 85.6 fL (ref 80.0–100.0)
Monocytes Absolute: 0.5 10*3/uL (ref 0.1–1.0)
Monocytes Relative: 6 %
Neutro Abs: 3.8 10*3/uL (ref 1.7–7.7)
Neutrophils Relative %: 53 %
Platelets: 274 10*3/uL (ref 150–400)
RBC: 4.32 MIL/uL (ref 3.87–5.11)
RDW: 14.2 % (ref 11.5–15.5)
WBC: 7.2 10*3/uL (ref 4.0–10.5)
nRBC: 0 % (ref 0.0–0.2)

## 2021-07-14 LAB — COMPREHENSIVE METABOLIC PANEL
ALT: 23 U/L (ref 0–44)
AST: 25 U/L (ref 15–41)
Albumin: 3 g/dL — ABNORMAL LOW (ref 3.5–5.0)
Alkaline Phosphatase: 87 U/L (ref 38–126)
Anion gap: 11 (ref 5–15)
BUN: 16 mg/dL (ref 6–20)
CO2: 27 mmol/L (ref 22–32)
Calcium: 8.9 mg/dL (ref 8.9–10.3)
Chloride: 101 mmol/L (ref 98–111)
Creatinine, Ser: 0.99 mg/dL (ref 0.44–1.00)
GFR, Estimated: >60 mL/min (ref >60)
Glucose, Bld: 101 mg/dL — ABNORMAL HIGH (ref 70–99)
Potassium: 3.7 mmol/L (ref 3.5–5.1)
Sodium: 139 mmol/L (ref 135–145)
Total Bilirubin: 0.4 mg/dL (ref 0.3–1.2)
Total Protein: 7 g/dL (ref 6.5–8.1)

## 2021-07-14 LAB — TROPONIN I (HIGH SENSITIVITY): Troponin I (High Sensitivity): 4 ng/L (ref <18)

## 2021-07-14 LAB — BRAIN NATRIURETIC PEPTIDE: B Natriuretic Peptide: 89.5 pg/mL (ref 0.0–100.0)

## 2021-07-14 LAB — LACTIC ACID, PLASMA: Lactic Acid, Venous: 1.4 mmol/L (ref 0.5–1.9)

## 2021-07-14 IMAGING — CR DG CHEST 2V
2 series · 2 of 2 positions shown · non-contrast
Comparison: [DATE]

CLINICAL DATA: Chest pain and shortness of breath.

EXAM:
CHEST - 2 VIEW

[chest pa]
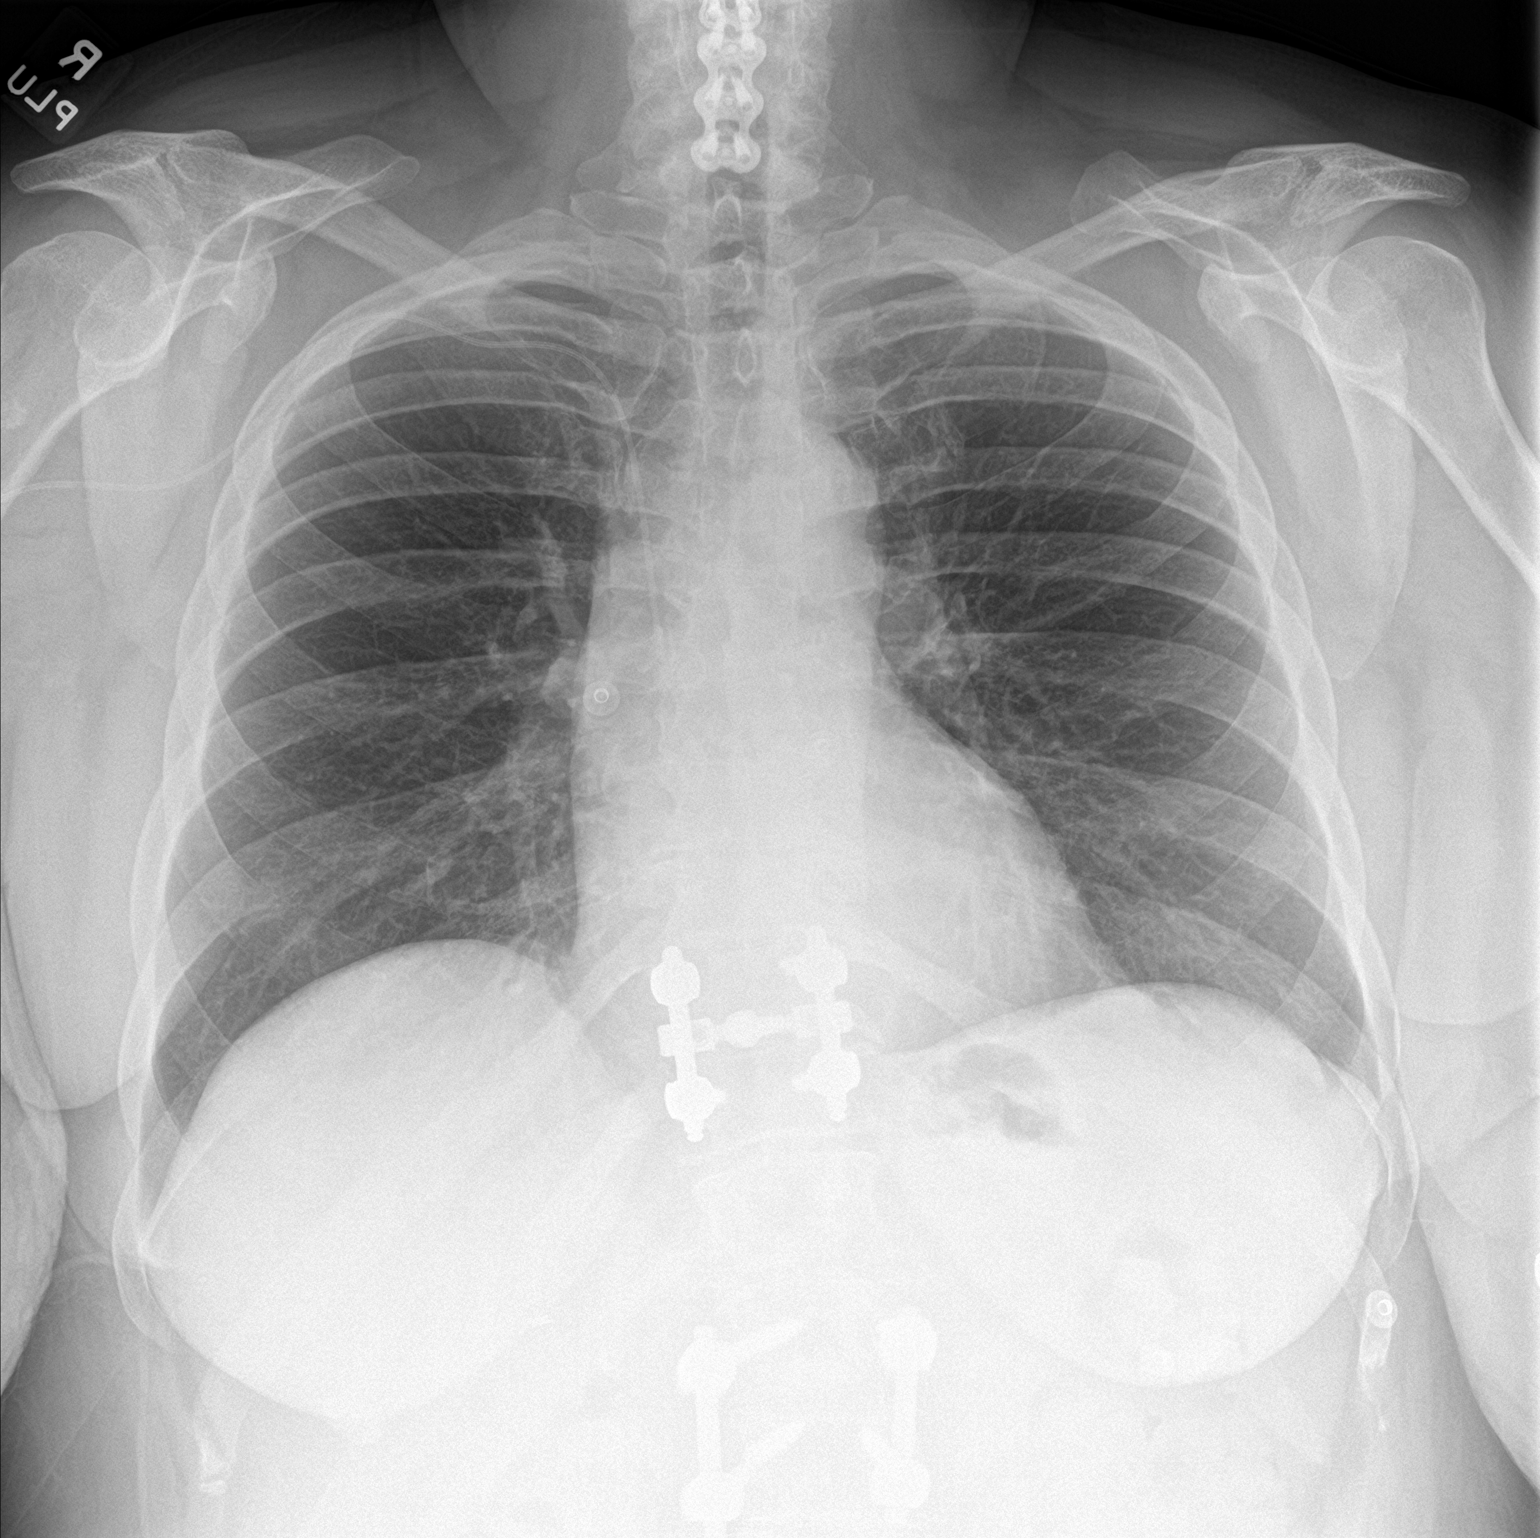

[chest lat]
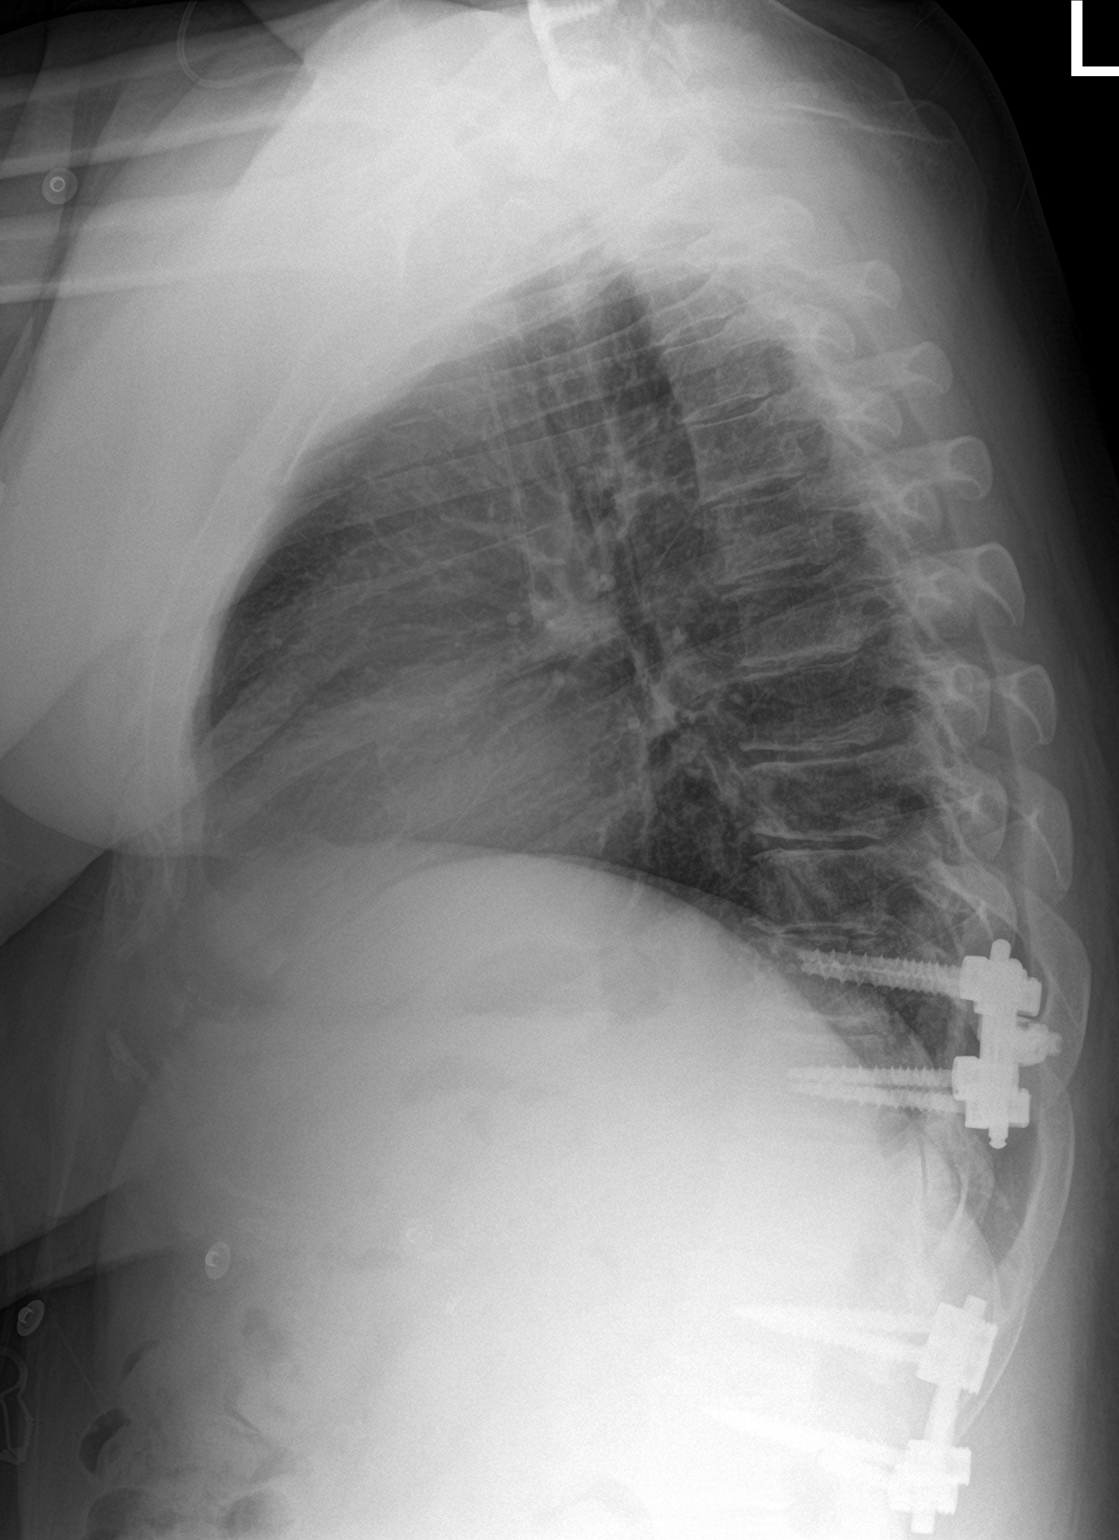

[2 of 2 positions shown; findings below may reference images not displayed]

FINDINGS: The right-sided PICC line is seen with its distal tip noted at the
junction of the superior vena cava and right atrium. There is no
evidence of acute infiltrate, pleural effusion or pneumothorax. The
heart size and mediastinal contours are within normal limits. A
radiopaque fixation plate and screws are seen overlying the cervical
spine. Bilateral pedicle screws are noted within the lower thoracic
spine and lumbar spine. No acute osseous abnormalities are seen.
IMPRESSION: No active cardiopulmonary disease.

## 2021-07-14 NOTE — ED Provider Notes (Signed)
Emergency Medicine Provider Triage Evaluation Note  Mary Chambers , a 50 y.o. female  was evaluated in triage.  Pt complains of central chest pain with radiation to back.  Also feeling clammy and pale.  Some nausea/vomiting prior to EMS arrival.  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  She is awaiting surgery with cardiothoracic surgery for valve replacement.  Still has PICC line for home ampicillin and rocephin.  Review of Systems  Positive: Chest pain, SOB Negative: Diarrhea, numbness, weakness  Physical Exam  BP 133/71 (BP Location: Left Arm)   Pulse 99   Temp 99 F (37.2 C) (Oral)   Resp 19   SpO2 96%   Gen:   Awake, no distress   Resp:  Normal effort  MSK:   Moves extremities without difficulty, swelling bilateral ankles, grossly symmetric Other:  PICC line right upper arm, ampicillin infusing  Medical Decision Making  Medically screening exam initiated at 10:30 PM.  Appropriate orders placed.  Mary Chambers was informed that the remainder of the evaluation will be completed by another provider, this initial triage assessment does not replace that evaluation, and the importance of remaining in the ED until their evaluation is complete.  Ongoing treatment for endocarditis with ampicillin and rocephin, awaiting valve replacement with CT surgery.  With worsening chest pain and shortness of breath today, concern for possible septic emboli.  Repeat labs, cultures, and CT angio chest ordered.  Made acuity 2, will prioritize room assignment.   Larene Pickett, PA-C 07/14/21 2304    Lajean Saver, MD 07/18/21 [phone]

## 2021-07-14 NOTE — ED Triage Notes (Addendum)
Pt arrived by [facility] from home. Reports sudden onset of chest pain that radiates into her back. Pt was recently discharged following sepsis, has picc line in place, receiving ampicillin infusion. Received 1 nitro and ASA pta.

## 2021-07-15 ENCOUNTER — Emergency Department (HOSPITAL_COMMUNITY): Payer: 59

## 2021-07-15 DIAGNOSIS — R0789 Other chest pain: Secondary | ICD-10-CM | POA: Diagnosis not present

## 2021-07-15 LAB — TROPONIN I (HIGH SENSITIVITY): Troponin I (High Sensitivity): 4 ng/L (ref <18)

## 2021-07-15 IMAGING — CT CT ANGIO CHEST
2 of 7 series · 17 of 46 positions shown · IV contrast (APPLIED)
Comparison: Chest radiograph [DATE] CT chest [DATE]

CLINICAL DATA: Pulmonary embolus suspected with high probability.
Ongoing endocarditis. Worsening chest pain and shortness of breath.

EXAM:
CT ANGIOGRAPHY CHEST WITH CONTRAST
TECHNIQUE: Multidetector CT imaging of the chest was performed using the
standard protocol during bolus administration of intravenous
contrast. Multiplanar CT image reconstructions and MIPs were
obtained to evaluate the vascular anatomy.
CONTRAST:  100mL OMNIPAQUE IOHEXOL 350 MG/ML SOLN

[Series 11: thins · axial · 0.87mm/px · z∈[-25,+229]mm · 14 of 409 slices shown]
[im 23/409  lung]
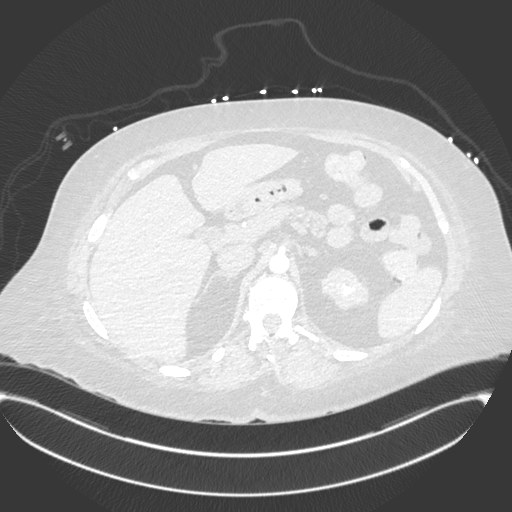
[im 46/409  soft-tissue]
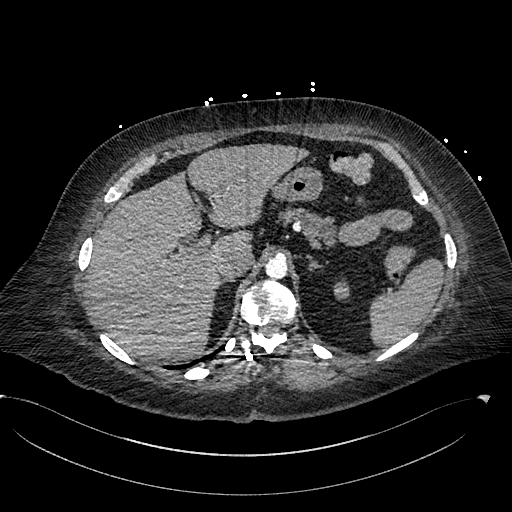
[im 91/409  lung]
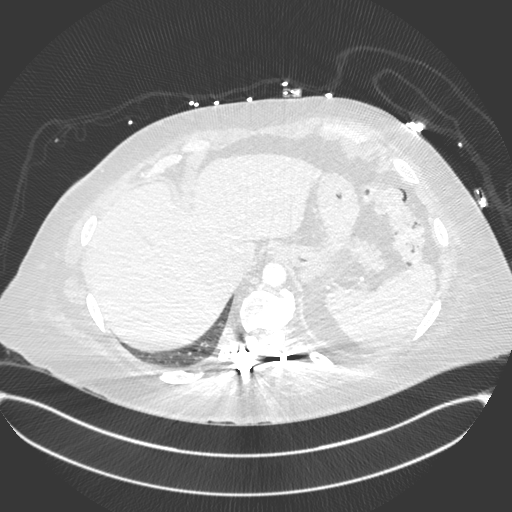
[im 114/409  soft-tissue]
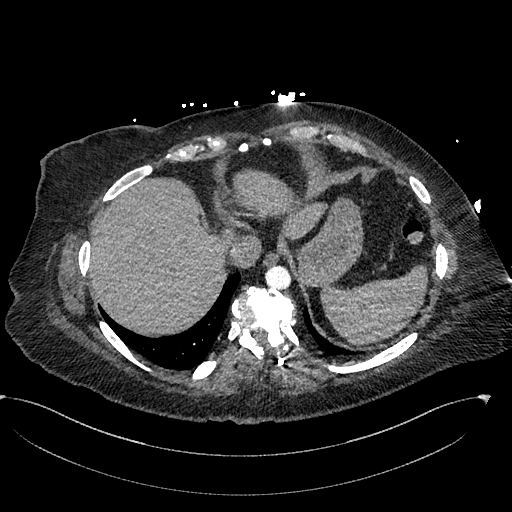
[im 137/409  lung]
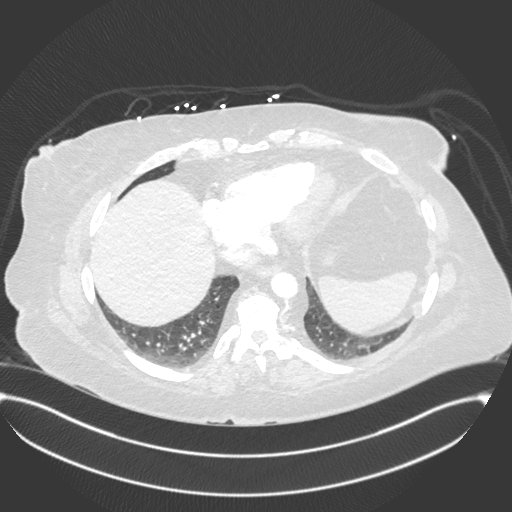
[im 159/409  soft-tissue]
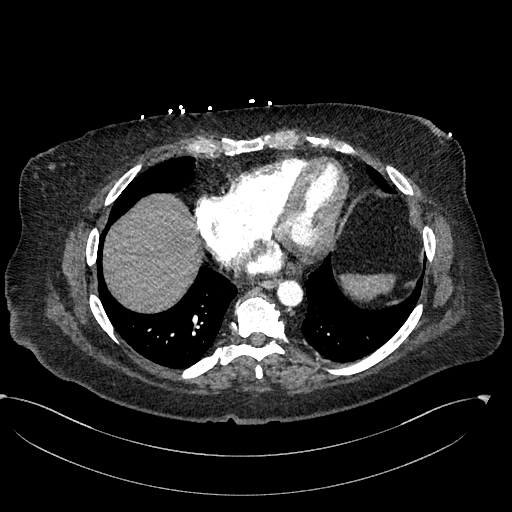
[im 182/409  lung]
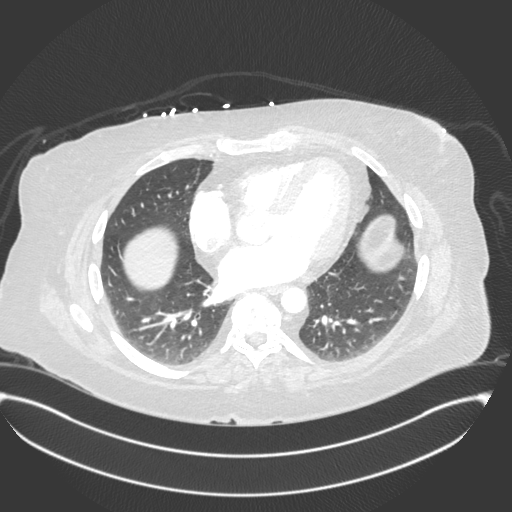
[im 227/409  soft-tissue]
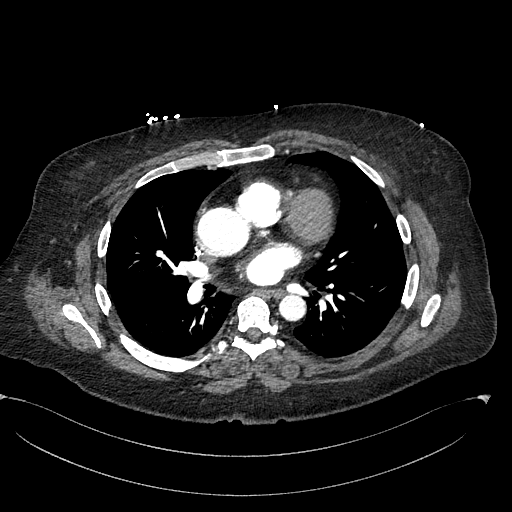
[im 250/409  lung]
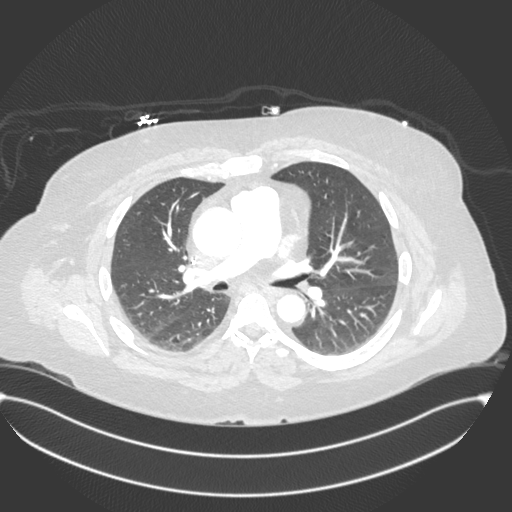
[im 273/409  soft-tissue]
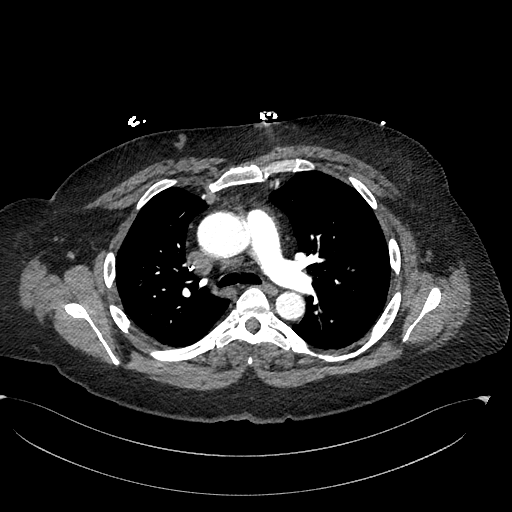
[im 295/409  lung]
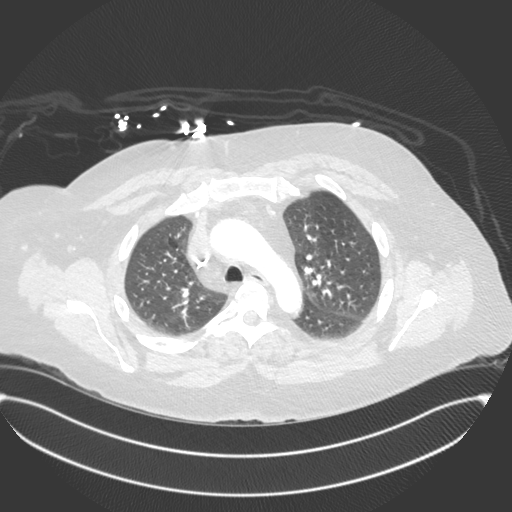
[im 318/409  soft-tissue]
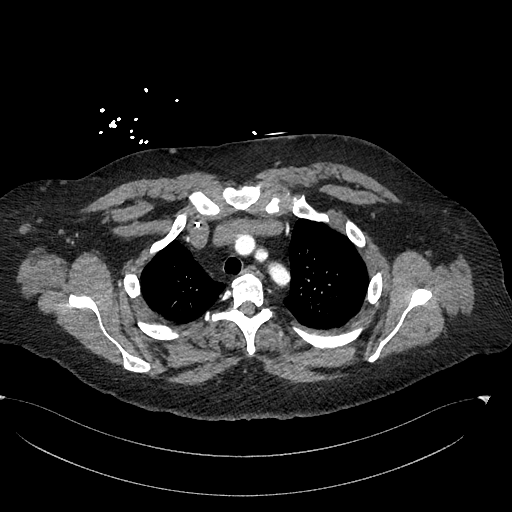
[im 363/409  lung]
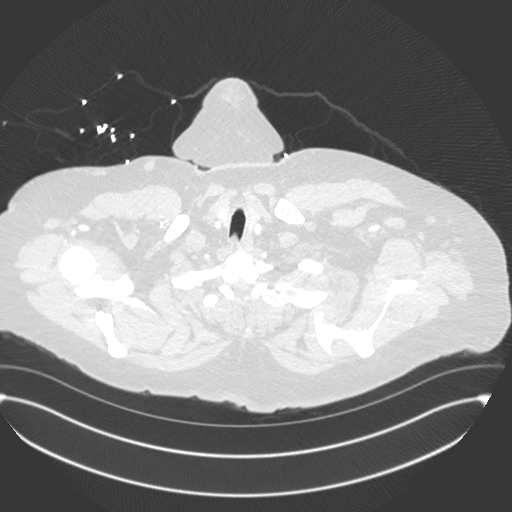
[im 386/409  soft-tissue]
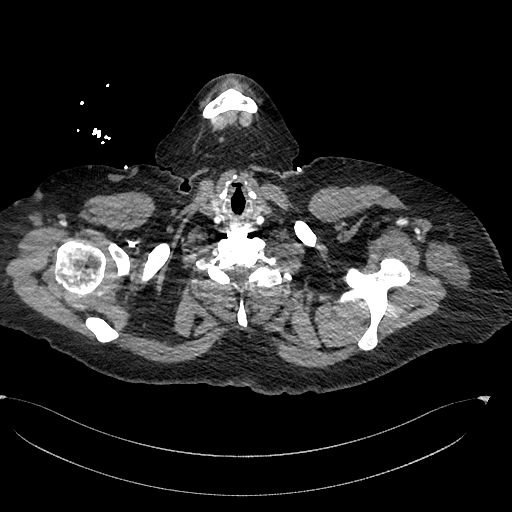

[Series 12: cor · coronal · 0.56mm/px · 3 of 173 slices shown]
[im 44/173  soft-tissue]
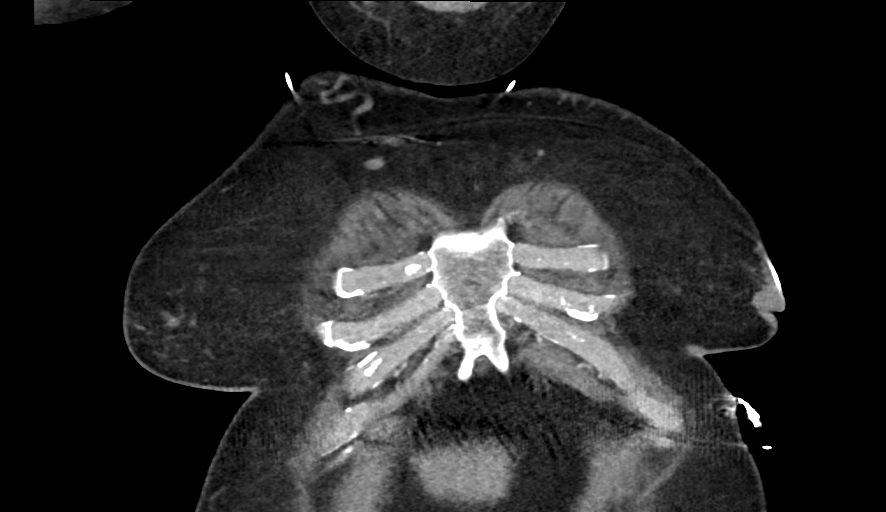
[im 87/173  soft-tissue]
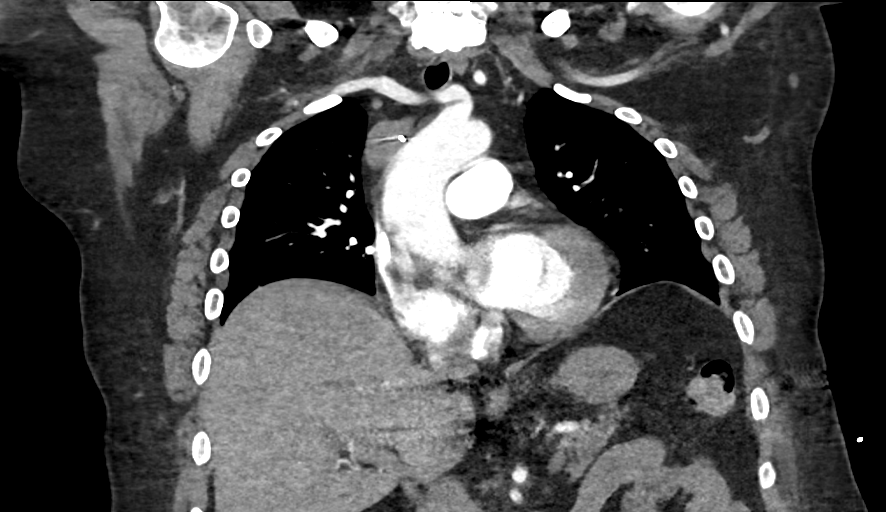
[im 130/173  soft-tissue]
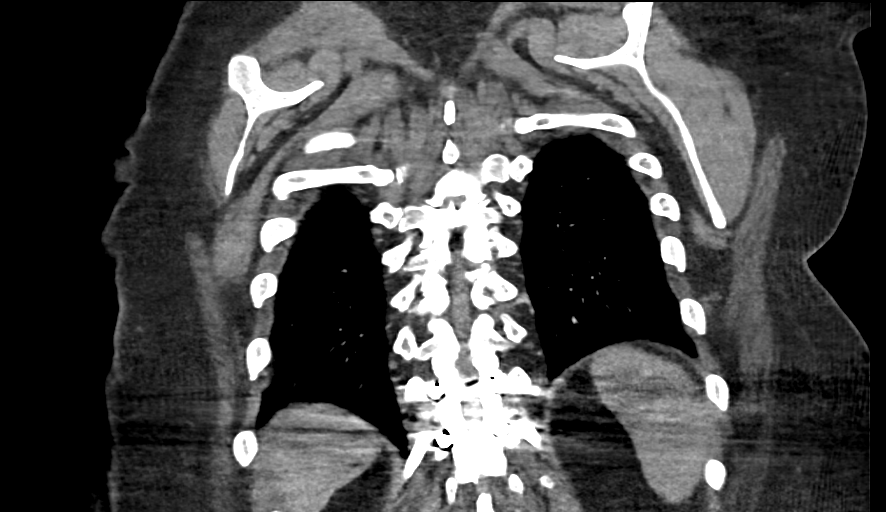

[17 of 46 positions shown; findings below may reference images not displayed]

FINDINGS: Cardiovascular: Good opacification of the central and segmental
pulmonary arteries. No focal filling defects. No evidence of
significant pulmonary embolus. Normal heart size. No pericardial
effusions. Ascending aortic aneurysm measuring 4.1 cm diameter. No
aortic dissection. Great vessel origins are patent.

Mediastinum/Nodes: Thyroid gland is unremarkable. Esophagus is
decompressed. No significant lymphadenopathy.

Lungs/Pleura: Mild dependent changes in the lung bases. No airspace
disease or consolidation. No pleural effusions. No pneumothorax.

Upper Abdomen: Surgical absence of the gallbladder. No acute
abnormalities demonstrated in the upper abdomen.

Musculoskeletal: Postoperative changes in the cervical spine and
lower thoracic spine. Degenerative changes. No destructive bone
lesions.

Review of the MIP images confirms the above findings.
IMPRESSION: 1. No evidence of significant pulmonary embolus.
2. No evidence of active pulmonary disease.
3. 4.1 cm diameter ascending aortic aneurysm. Recommend annual
imaging followup by CTA or MRA. This recommendation follows [UG]
ACCF/AHA/AATS/ACR/ASA/SCA/MIIDO/MIIDO/MIIDO/MIIDO Guidelines for the
Diagnosis and Management of Patients with Thoracic Aortic Disease.
Circulation. [UG]; 121: [id number]. Aortic aneurysm NOS ([UG]-[UG])

## 2021-07-15 MED ORDER — IOHEXOL 350 MG/ML SOLN
100.0000 mL | Freq: Once | INTRAVENOUS | Status: AC | PRN
  Administered 2021-07-15: 100 mL via INTRAVENOUS

## 2021-07-15 MED ORDER — TRAMADOL HCL 50 MG PO TABS: 50.0000 mg | ORAL_TABLET | Freq: Four times a day (QID) | ORAL | 0 refills | Status: DC | PRN

## 2021-07-15 NOTE — Discharge Instructions (Addendum)
Take ibuprofen 600 mg every 6 hours as needed for pain.  Begin taking tramadol as prescribed as needed for pain not relieved with ibuprofen.  Follow-up with your cardiologist in the next week, and return to the ER symptoms significantly worsen or change.

## 2021-07-15 NOTE — ED Provider Notes (Signed)
[facility] EMERGENCY DEPARTMENT Provider Note   CSN: [id number] Arrival date & time: 07/14/21  2231     History Chief Complaint  Patient presents with   Chest Pain    Mary Chambers is a 50 y.o. female.  Patient is a 50 year old female with history of vertebral osteomyelitis, currently receiving antibiotics at home through a PICC line.  She was recently admitted here for sepsis related to this.  She also has a history of heart valve issues and tells me surgery is upcoming.  Patient presents today with complaints of chest discomfort.  This began earlier this evening while she was at rest.  She describes a pressure to the center of her chest and felt clammy.  Patient symptoms have improved during her extended wait in the ER.  She denies any prior cardiac history other than her heart valve issues.  She denies any swelling in her legs, calf pain, or difficulty breathing.  The history is provided by the patient.      Past Medical History:  Diagnosis Date   Arthritis    Depression    Hypertension    Kidney stone    Obesity     Patient Active Problem List   Diagnosis Date Noted   Vertebral osteomyelitis ([place])    Septic arthritis ([place]) 06/23/2021   Transaminitis 06/23/2021   Abscess 06/23/2021   Sepsis ([place]) 06/23/2021   COVID-19 virus infection 06/23/2021   Suspected UTI 06/23/2021   Gastroesophageal reflux disease 12/15/2020   Screening for colorectal cancer 12/15/2020   Nausea without vomiting 12/15/2020   History of gastric ulcer 12/15/2020   Herniated lumbar disc without myelopathy 10/02/2019   Lumbar spinal stenosis 10/01/2019   Pain in left foot 03/29/2018   Arthritis 02/13/2018   Chronic back pain 02/13/2018   Migraines 02/13/2018   Morbidly obese ([place]) 02/13/2018   Synovial cyst of lumbar spine 10/15/2017   Osteoarthritis of knee 07/13/2017   Lumbar disc disease with radiculopathy 08/01/2016   Fatigue 05/04/2016   Headache  05/04/2016   Numbness of foot 05/04/2016   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   Arthrodesis status 02/02/2016   Chronic pain syndrome 02/02/2016   Elevated white blood cell count 12/01/2015   Lumbar stenosis with neurogenic claudication 05/07/2015   Bilateral leg edema 04/21/2015   Carpal tunnel syndrome of right wrist 12/14/2014   Renal atrophy, left 01/23/2014   Achilles tendonitis 01/16/2014   Right lower quadrant pain 12/18/2013   Hypertension, benign 06/03/2010   Urinary incontinence 06/03/2010   Cervical disc disease 09/09/2009   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     Family History  Problem Relation Age of Onset   Diabetes Mother    Hypertension Mother    Cancer Father    Parkinson's disease Father    Heart disease Father    Heart disease Sister    Heart disease Brother  Colon cancer Neg Hx    Rectal cancer Neg Hx    Stomach cancer Neg Hx     Social History   Tobacco Use   Smoking status: Never   Smokeless tobacco: Never  Vaping Use   Vaping Use: Never used  Substance Use Topics   Alcohol use: No   Drug use: Never    Home Medications Prior to Admission medications   Medication Sig Start Date End Date Taking? Authorizing Provider  ampicillin IVPB Inject 12 g into the vein daily. As a Continuous Infusion Indication:  Bateremia/Endocardits/Paraspinal Abscess First Dose: Yes Last Day of Therapy:  08/05/21 Labs - Once weekly:  CBC/D and BMP, Labs - Every other  week:  ESR and CRP Method of administration: Ambulatory Pump (Continuous Infusion) Method of administration may be changed at the discretion of home infusion pharmacist based upon assessment of the patient and/or caregiver's ability to self-administer the medication ordered. 06/30/21 08/05/21 Yes Nita Sells, MD  Ascorbic Acid (VITAMIN C) 500 MG CAPS Take 500 mg by mouth daily.   Yes [provider]  atorvastatin (LIPITOR) 10 MG tablet Take 10 mg by mouth daily. 04/27/21  Yes [provider]  cholecalciferol (VITAMIN D3) 25 MCG (1000 UT) tablet Take 1,000 Units by mouth daily.   Yes [provider]  enalapril (VASOTEC) 10 MG tablet Take 10 mg by mouth daily.   Yes [provider]  fluticasone (FLONASE) 50 MCG/ACT nasal spray Place 2 sprays into both nostrils daily. Patient taking differently: Place 2 sprays into both nostrils daily as needed for allergies. 05/03/21  Yes Brunetta Jeans, PA-C  furosemide (LASIX) 20 MG tablet Take 20 mg by mouth daily. 05/31/21  Yes [provider]  hydrochlorothiazide (HYDRODIURIL) 25 MG tablet Take 25 mg by mouth daily.   Yes [provider]  HYDROcodone-acetaminophen (NORCO/VICODIN) 5-325 MG tablet Take 1 tablet by mouth every 4 (four) hours as needed for moderate pain. 07/01/21  Yes Nita Sells, MD  loperamide (IMODIUM) 2 MG capsule Take 1 capsule (2 mg total) by mouth as needed for diarrhea or loose stools. 07/01/21  Yes Nita Sells, MD  Multiple Vitamin (MULTIVITAMIN) capsule Take 1 capsule by mouth daily.    Yes [provider]  naproxen (NAPROSYN) 250 MG tablet Take 250 mg by mouth 2 (two) times daily. 07/05/21  Yes [provider]  pantoprazole (PROTONIX) 40 MG tablet Take 40 mg by mouth daily. 04/22/21  Yes [provider]  tiZANidine (ZANAFLEX) 4 MG tablet Take 4 mg by mouth 2 (two) times daily as needed (for back pain/sleep). 06/20/21  Yes [provider]  cefTRIAXone (ROCEPHIN) IVPB Inject 2 g into the vein every 12 (twelve) hours. Indication:  Bateremia/Endocardits/Paraspinal Abscess First Dose: Yes Last Day of Therapy:  08/05/21 Labs - Once weekly:  CBC/D and BMP, Labs - Every other week:  ESR and CRP Method of administration: IV Push Method of administration may be changed at the discretion of home infusion pharmacist based upon assessment of the patient and/or caregiver's ability to self-administer the medication ordered. Patient not taking: Reported on 07/15/2021 06/30/21 08/05/21  Nita Sells, MD  HYDROcodone-acetaminophen [facility]) 10-325 MG tablet Take 1 tablet by mouth every 4 (four) hours as needed for severe pain. Patient not taking: Reported on 07/15/2021 07/01/21   Nita Sells, MD  diphenhydrAMINE (BENADRYL) 25 mg capsule Take by mouth. 03/10/16 06/21/19  [provider]  LYRICA 50 MG capsule Take 50 mg by mouth 3 (three) times daily.  04/15/18 06/07/19  [provider]    Allergies    Baclofen, Doxycycline, Percocet [oxycodone-acetaminophen], Septra [sulfamethoxazole-trimethoprim], and Gabapentin  Review of Systems   Review of Systems  All other systems reviewed and are negative.  Physical Exam Updated Vital Signs BP (!) 102/45   Pulse 82   Temp 99 F (37.2 C) (Oral)   Resp (!) 22   SpO2 96%   Physical Exam Vitals and nursing note reviewed.  Constitutional:      General: She is not in acute distress.    Appearance: She is well-developed. She is not diaphoretic.  HENT:     Head: Normocephalic and atraumatic.  Cardiovascular:     Rate and Rhythm: Normal rate and regular rhythm.     Heart sounds: No murmur heard.   No friction rub. No gallop.  Pulmonary:     Effort: Pulmonary effort is normal. No respiratory distress.     Breath sounds: Normal breath sounds. No wheezing.  Abdominal:     General: Bowel sounds are normal. There is no distension.     Palpations: Abdomen is  soft.     Tenderness: There is no abdominal tenderness.  Musculoskeletal:        General: Normal range of motion.     Cervical back: Normal range of motion and neck supple.  Skin:    General: Skin is warm and dry.  Neurological:     General: No focal deficit present.     Mental Status: She is alert and oriented to person, place, and time.    ED Results / Procedures / Treatments   Labs (all labs ordered are listed, but only abnormal results are displayed) Labs Reviewed  CBC WITH DIFFERENTIAL/PLATELET - Abnormal; Notable for the following components:      Result Value   Hemoglobin 11.6 (*)    All other components within normal limits  COMPREHENSIVE METABOLIC PANEL - Abnormal; Notable for the following components:   Glucose, Bld 101 (*)    Albumin 3.0 (*)    All other components within normal limits  CULTURE, BLOOD (ROUTINE X 2)  CULTURE, BLOOD (ROUTINE X 2)  LACTIC ACID, PLASMA  BRAIN NATRIURETIC PEPTIDE  TROPONIN I (HIGH SENSITIVITY)  TROPONIN I (HIGH SENSITIVITY)    EKG EKG Interpretation  Date/Time:  Thursday July 14 2021 22:34:27 EDT Ventricular Rate:  100 PR Interval:  168 QRS Duration: 80 QT Interval:  348 QTC Calculation: 448 R Axis:   -8 Text Interpretation: Normal sinus rhythm Minimal voltage criteria for LVH, may be normal variant Borderline ECG No significant change since 06/23/2021 Confirmed by Veryl Speak [phone]) on 07/15/2021 1:34:59 AM  Radiology DG Chest 2 View  Result Date: 07/14/2021 CLINICAL DATA:  Chest pain and shortness of breath. EXAM: CHEST - 2 VIEW COMPARISON:  June 23, 2021 FINDINGS: The right-sided PICC line is seen with its distal tip noted at the junction of the superior vena cava and right atrium. There is no evidence of acute infiltrate, pleural effusion or pneumothorax. The heart size and mediastinal contours are within normal limits. A radiopaque fixation plate and screws are seen overlying the cervical spine. Bilateral pedicle  screws are noted within the lower thoracic spine and lumbar spine. No acute osseous abnormalities are seen. IMPRESSION: No active cardiopulmonary disease. Electronically Signed   By: Virgina Norfolk M.D.   On: 07/14/2021 23:07   CT Angio Chest PE W and/or Wo Contrast  Result Date: 07/15/2021 CLINICAL DATA:  Pulmonary embolus suspected with high probability.  Ongoing endocarditis. Worsening chest pain and shortness of breath. EXAM: CT ANGIOGRAPHY CHEST WITH CONTRAST TECHNIQUE: Multidetector CT imaging of the chest was performed using the standard protocol during bolus administration of intravenous contrast. Multiplanar CT image reconstructions and MIPs were obtained to evaluate the vascular anatomy. CONTRAST:  118m OMNIPAQUE IOHEXOL 350 MG/ML SOLN COMPARISON:  Chest radiograph 07/14/2021 CT chest 04/22/2020 FINDINGS: Cardiovascular: Good opacification of the central and segmental pulmonary arteries. No focal filling defects. No evidence of significant pulmonary embolus. Normal heart size. No pericardial effusions. Ascending aortic aneurysm measuring 4.1 cm diameter. No aortic dissection. Great vessel origins are patent. Mediastinum/Nodes: Thyroid gland is unremarkable. Esophagus is decompressed. No significant lymphadenopathy. Lungs/Pleura: Mild dependent changes in the lung bases. No airspace disease or consolidation. No pleural effusions. No pneumothorax. Upper Abdomen: Surgical absence of the gallbladder. No acute abnormalities demonstrated in the upper abdomen. Musculoskeletal: Postoperative changes in the cervical spine and lower thoracic spine. Degenerative changes. No destructive bone lesions. Review of the MIP images confirms the above findings. IMPRESSION: 1. No evidence of significant pulmonary embolus. 2. No evidence of active pulmonary disease. 3. 4.1 cm diameter ascending aortic aneurysm. Recommend annual imaging followup by CTA or MRA. This recommendation follows 2010  ACCF/AHA/AATS/ACR/ASA/SCA/SCAI/SIR/STS/SVM Guidelines for the Diagnosis and Management of Patients with Thoracic Aortic Disease. Circulation. 2010; 121:: [id number] Aortic aneurysm NOS (ICD10-I71.9) Electronically Signed   By: WLucienne CapersM.D.   On: 07/15/2021 03:05    Procedures Procedures   Medications Ordered in ED Medications  iohexol (OMNIPAQUE) 350 MG/ML injection 100 mL (100 mLs Intravenous Contrast Given 07/15/21 0248)    ED Course  I have reviewed the triage vital signs and the nursing notes.  Pertinent labs & imaging results that were available during my care of the patient were reviewed by me and considered in my medical decision making (see chart for details).    MDM Rules/Calculators/A&P  Patient presenting here with complaints of chest discomfort that seems atypical for cardiac pain and is reproducible with palpation and her work-up is unremarkable.  EKG is unchanged and troponin x2 was negative.  PE study also obtained, showed no signs of pulmonary embolism.  There was the incidental finding of 4.1 cm a sending aortic aneurysm for which annual imaging is recommended.  Patient notified of this finding and is to follow-up with her primary doctor.  Patient now sleeping and resting comfortably.  I see no indication for admission and feel as though patient can be discharged with as needed return.  Final Clinical Impression(s) / ED Diagnoses Final diagnoses:  None    Rx / DC Orders ED Discharge Orders     None        DVeryl Speak MD 07/15/21 [phone]

## 2021-07-19 LAB — CULTURE, BLOOD (ROUTINE X 2)
Culture: NO GROWTH
Culture: NO GROWTH
Special Requests: ADEQUATE
Special Requests: ADEQUATE

## 2021-07-26 ENCOUNTER — Ambulatory Visit (INDEPENDENT_AMBULATORY_CARE_PROVIDER_SITE_OTHER): Payer: 59 | Admitting: Cardiology

## 2021-07-26 ENCOUNTER — Other Ambulatory Visit: Payer: Self-pay

## 2021-07-26 ENCOUNTER — Ambulatory Visit (INDEPENDENT_AMBULATORY_CARE_PROVIDER_SITE_OTHER): Payer: 59 | Admitting: Internal Medicine

## 2021-07-26 ENCOUNTER — Encounter (HOSPITAL_BASED_OUTPATIENT_CLINIC_OR_DEPARTMENT_OTHER): Payer: Self-pay | Admitting: Cardiology

## 2021-07-26 ENCOUNTER — Encounter: Payer: Self-pay | Admitting: Internal Medicine

## 2021-07-26 VITALS — BP 143/87 | HR 95 | Temp 97.3°F | Resp 16 | Ht 67.0 in | Wt 263.4 lb

## 2021-07-26 VITALS — BP 118/64 | HR 98 | Ht 67.5 in | Wt 262.0 lb

## 2021-07-26 DIAGNOSIS — I10 Essential (primary) hypertension: Secondary | ICD-10-CM | POA: Diagnosis not present

## 2021-07-26 DIAGNOSIS — Z01812 Encounter for preprocedural laboratory examination: Secondary | ICD-10-CM

## 2021-07-26 DIAGNOSIS — I358 Other nonrheumatic aortic valve disorders: Secondary | ICD-10-CM

## 2021-07-26 DIAGNOSIS — Z01818 Encounter for other preprocedural examination: Secondary | ICD-10-CM | POA: Diagnosis not present

## 2021-07-26 DIAGNOSIS — Q231 Congenital insufficiency of aortic valve: Secondary | ICD-10-CM

## 2021-07-26 DIAGNOSIS — M4650 Other infective spondylopathies, site unspecified: Secondary | ICD-10-CM | POA: Diagnosis not present

## 2021-07-26 DIAGNOSIS — M4620 Osteomyelitis of vertebra, site unspecified: Secondary | ICD-10-CM

## 2021-07-26 DIAGNOSIS — I351 Nonrheumatic aortic (valve) insufficiency: Secondary | ICD-10-CM

## 2021-07-26 NOTE — Patient Instructions (Signed)
Medication Instructions:  Your Physician recommend you continue on your current medication as directed.    *If you need a refill on your cardiac medications before your next appointment, please call your pharmacy*   Lab Work: Your provider has recommended lab work today (CBC, BMP). Please have this collected at [facility] at [place]. The lab is open 8:00 am - 4:30 pm. Please avoid 12:00p - 1:00p for lunch hour. You do not need an appointment. Please go to [address] [place], [place] [address]. This is in the Primary Care office on the 3rd floor, let them know you are there for blood work and they will direct you to the lab.  If you have labs (blood work) drawn today and your tests are completely normal, you will receive your results only by: Mary Chambers (if you have MyChart) OR A paper copy in the mail If you have any lab test that is abnormal or we need to change your treatment, we will call you to review the results.   Testing/Procedures: TEE [place]: At [facility], you and your health needs are our priority.  As part of our continuing mission to provide you with exceptional heart care, we have created designated Provider Care Teams.  These Care Teams include your primary Cardiologist (physician) and Advanced Practice Providers (APPs -  Physician Assistants and Nurse Practitioners) who all work together to provide you with the care you need, when you need it.  We recommend signing up for the patient portal called "MyChart".  Sign up information is provided on this After Visit Summary.  MyChart is used to connect with patients for Virtual Visits (Telemedicine).  Patients are able to view lab/test results, encounter notes, upcoming appointments, etc.  Non-urgent messages can be sent to your provider as well.   To learn more about what you can do with MyChart, go to [email or web address].    Your next appointment:   3  month(s)  The format for your next appointment:   In Person  Provider:   [name], MD    You are scheduled for a TEE on Wednesday 08/31/21 with Dr. Johney Chambers.  Please arrive at the [facility] (Main Entrance A) at [facility]: [address] [place], [place] [address] at 8 am   DIET: Nothing to eat or drink after midnight except a sip of water with medications (see medication instructions below)  FYI: For your safety, and to allow [place] to monitor your vital signs accurately during the surgery/procedure we request that   if you have artificial nails, gel coating, SNS etc. Please have those removed prior to your surgery/procedure. Not having the nail coverings /polish removed may result in cancellation or delay of your surgery/procedure.   Medication Instructions: No special instructions  Labs: Today (CBC, BMP)  You must have a responsible person to drive you home and stay in the waiting area during your procedure. Failure to do so could result in cancellation.  Bring your insurance cards.  *Special Note: Every effort is made to have your procedure done on time. Occasionally there are emergencies that occur at the hospital that may cause delays. Please be patient if a delay does occur.

## 2021-07-26 NOTE — Assessment & Plan Note (Signed)
On dual therapy with ampicillin and ceftriaxone for this and doing well overall.  Severe AI noted on initial TEE and scheduled to see cardiology today, likely to get a repeat TEE and consideration of TCTS evaluation after that.

## 2021-07-26 NOTE — Assessment & Plan Note (Signed)
Abscess and soft tissue infection noted and overall she is doing well on her current antibiotics.  Back pain lessened and back to work.  No new concerns.

## 2021-07-26 NOTE — Progress Notes (Signed)
Cardiology Office Note:    Date:  07/27/2021   ID:  Mary Chambers, DOB 1971-05-26, MRN [id number]  PCP:  [name], NP  Cardiologist:  [name], MD  Referring MD: [name], NP   CC: post hospital follow up   History of Present Illness:    Mary Chambers is a 50 y.o. female with a hx of hypertension, obesity, arthritis, and depression, who is seen for post-hospital follow-up.    She presented to the ED 07/14/2021 with acute chest pain, evaluation unremarkable at that time. I met her during her hospitalization 06/2021 for paraspinal abscesses and endocarditis, with incidental Covid 19 infection at the time.  Today: She is accompanied by her son. Overall, she feels like she is "hanging in there." She notes having a fever of 99.9 this past Saturday.  She affirms having shortness of breath while walking.  Occasionally she will become lightheaded, even while sitting still.  Additionally she has noticed edema in her bilateral ankles (L>R).  Also, she continues to have some back pain that "comes and goes."  She denies any palpitations, or chest pain. No headaches, syncope, orthopnea, or PND. Endorses bruising easily. Tearful at times.  Earlier today she followed up with Dr. 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 She is due to discontinue antibiotics next week. We discussed timing of TEE and further pre-op workup with cath based on results of TEE.   Past Medical History:  Diagnosis Date   Arthritis    Depression    Hypertension    Kidney stone    Obesity     Past Surgical History:  Procedure Laterality Date   ABDOMINAL HYSTERECTOMY     BACK SURGERY     CARPAL TUNNEL RELEASE     CHOLECYSTECTOMY     IR FLUORO GUIDED NEEDLE PLC ASPIRATION/INJECTION LOC  06/23/2021   LITHOTRIPSY     neck fusion     TEE WITHOUT CARDIOVERSION N/A 06/28/2021   Procedure: TRANSESOPHAGEAL ECHOCARDIOGRAM (TEE);  Surgeon: Lelon Perla, MD;  Location: [facility] ENDOSCOPY;  Service: Cardiovascular;  Laterality: N/A;   TUBAL LIGATION      Current Medications: Current Outpatient Medications on File Prior to Visit  Medication Sig   ampicillin IVPB Inject 12 g into the vein daily. As a Continuous Infusion Indication:  Bateremia/Endocardits/Paraspinal Abscess First Dose: Yes Last Day of Therapy:  08/05/21 Labs - Once weekly:  CBC/D and BMP, Labs - Every other week:  ESR and CRP Method of administration: Ambulatory Pump (Continuous Infusion) Method of administration may be changed at the discretion of home infusion pharmacist based upon assessment of the patient and/or caregiver's ability to self-administer the medication ordered.   Ascorbic Acid (VITAMIN C) 500 MG CAPS Take 500 mg by mouth daily.   atorvastatin (LIPITOR) 10 MG tablet Take 10 mg by mouth daily.   cefTRIAXone (ROCEPHIN) IVPB Inject 2 g into the vein every 12 (twelve) hours. Indication:  Bateremia/Endocardits/Paraspinal Abscess First Dose: Yes Last Day of Therapy:  08/05/21 Labs - Once weekly:  CBC/D and BMP, Labs - Every other week:  ESR and CRP Method of administration: IV Push Method of administration may be changed at the discretion of home infusion pharmacist based upon assessment of the patient and/or caregiver's ability to self-administer the medication ordered. (Patient taking differently: Inject 2 g into the vein every 12 (twelve) hours. Indication:  Bateremia/Endocardits/Paraspinal Abscess First Dose: Yes Last Day of Therapy:  08/05/21 Labs - Once weekly:  CBC/D  and BMP, Labs - Every other week:  ESR and CRP Method of administration: IV Push Method of administration may be changed at the discretion of home infusion pharmacist based upon assessment of the patient and/or caregiver's ability to self-administer the medication ordered.)   cholecalciferol (VITAMIN D3) 25 MCG (1000 UT) tablet Take 1,000 Units by mouth daily.   enalapril (VASOTEC) 10 MG  tablet Take 10 mg by mouth daily.   fluticasone (FLONASE) 50 MCG/ACT nasal spray Place 2 sprays into both nostrils daily. (Patient taking differently: Place 2 sprays into both nostrils daily as needed for allergies.)   furosemide (LASIX) 20 MG tablet Take 20 mg by mouth daily.   hydrochlorothiazide (HYDRODIURIL) 25 MG tablet Take 25 mg by mouth daily.   loperamide (IMODIUM) 2 MG capsule Take 1 capsule (2 mg total) by mouth as needed for diarrhea or loose stools.   Multiple Vitamin (MULTIVITAMIN) capsule Take 1 capsule by mouth daily.    naproxen (NAPROSYN) 250 MG tablet Take 250 mg by mouth 2 (two) times daily.   pantoprazole (PROTONIX) 40 MG tablet Take 40 mg by mouth daily.   tiZANidine (ZANAFLEX) 4 MG tablet Take 4 mg by mouth 2 (two) times daily as needed (for back pain/sleep).   traMADol (ULTRAM) 50 MG tablet Take 1 tablet (50 mg total) by mouth every 6 (six) hours as needed.   [DISCONTINUED] diphenhydrAMINE (BENADRYL) 25 mg capsule Take by mouth.   [DISCONTINUED] LYRICA 50 MG capsule Take 50 mg by mouth 3 (three) times daily.   No current facility-administered medications on file prior to visit.     Allergies:   Baclofen, Doxycycline, Percocet [oxycodone-acetaminophen], Septra [sulfamethoxazole-trimethoprim], and Gabapentin   Social History   Tobacco Use   Smoking status: Never   Smokeless tobacco: Never  Vaping Use   Vaping Use: Never used  Substance Use Topics   Alcohol use: No   Drug use: Never    Family History: family history includes Cancer in her father; Diabetes in her mother; Heart disease in her brother, father, and sister; Hypertension in her mother; Parkinson's disease in her father. There is no history of Colon cancer, Rectal cancer, or Stomach cancer.  ROS:   Please see the history of present illness. (+) Lightheadedness (+) Bilateral LE edema, ankles (+) Easy bruising (+) Tearful All other systems are reviewed and negative.     EKGs/Labs/Other Studies  Reviewed:    The following studies were reviewed today:  CTA Chest 07/15/2021: COMPARISON:  Chest radiograph 07/14/2021 CT chest 04/22/2020   FINDINGS: Cardiovascular: Good opacification of the central and segmental pulmonary arteries. No focal filling defects. No evidence of significant pulmonary embolus. Normal heart size. No pericardial effusions. Ascending aortic aneurysm measuring 4.1 cm diameter. No aortic dissection. Great vessel origins are patent.   Mediastinum/Nodes: Thyroid gland is unremarkable. Esophagus is decompressed. No significant lymphadenopathy.   Lungs/Pleura: Mild dependent changes in the lung bases. No airspace disease or consolidation. No pleural effusions. No pneumothorax.   Upper Abdomen: Surgical absence of the gallbladder. No acute abnormalities demonstrated in the upper abdomen.   Musculoskeletal: Postoperative changes in the cervical spine and lower thoracic spine. Degenerative changes. No destructive bone lesions.   Review of the MIP images confirms the above findings.   IMPRESSION: 1. No evidence of significant pulmonary embolus. 2. No evidence of active pulmonary disease. 3. 4.1 cm diameter ascending aortic aneurysm. Recommend annual imaging followup by CTA or MRA. This recommendation follows 2010 ACCF/AHA/AATS/ACR/ASA/SCA/SCAI/SIR/STS/SVM Guidelines for the Diagnosis and Management of  Patients with Thoracic Aortic Disease. Circulation. 2010; 121: Q964-R838. Aortic aneurysm NOS (ICD10-I71.9)  Echo TEE 06/28/2021:  1. Bicuspid aortic valve (left and right cusps appear to be fused);  probable vegetation noted; severe AI.   2. Left ventricular ejection fraction, by estimation, is 60 to 65%. The  left ventricle has normal function. The left ventricle has no regional  wall motion abnormalities.   3. Right ventricular systolic function is normal. The right ventricular  size is normal.   4. No left atrial/left atrial appendage thrombus was  detected.   5. The mitral valve is normal in structure. No evidence of mitral valve  regurgitation.   6. The aortic valve is bicuspid. Aortic valve regurgitation is severe.  Echo 06/26/2021 1. Poor quality study bad acoustic windows limited.   2. Left ventricular ejection fraction, by estimation, is 55 to 60%. The  left ventricle has normal function. Left ventricular diastolic parameters  are indeterminate.   3. The mitral valve was not well visualized. No evidence of mitral valve  regurgitation.   4. AV not well seen Some calcification and ? mild to moderate AR Cannot  r/o vegetation Suggest TEE if clinically indicated . The aortic valve was  not well visualized.   5. The inferior vena cava is normal in size with greater than 50%  respiratory variability, suggesting right atrial pressure of 3 mmHg.   EKG:  EKG is personally reviewed.   07/26/2021: NSR at 98 bpm  Recent Labs: 06/29/2021: Magnesium 1.9 07/14/2021: ALT 23; B Natriuretic Peptide 89.5 07/26/2021: BUN 13; Creatinine, Ser 0.90; Hemoglobin 12.4; Platelets 312; Potassium 3.9; Sodium 143  Recent Lipid Panel No results found for: CHOL, TRIG, HDL, CHOLHDL, VLDL, LDLCALC, LDLDIRECT  Physical Exam:    VS:  BP 118/64   Pulse 98   Ht 5' 7.5" (1.715 m)   Wt 262 lb (118.8 kg)   SpO2 96%   BMI 40.43 kg/m     Wt Readings from Last 3 Encounters:  07/26/21 262 lb (118.8 kg)  07/26/21 263 lb 6.4 oz (119.5 kg)  06/22/21 266 lb (120.7 kg)    GEN: Well nourished, well developed in no acute distress HEENT: Normal, moist mucous membranes NECK: No JVD CARDIAC: regular rhythm, normal S1 and S2, no rubs or gallops. 2/6 systolic murmur and 2/4 diastolic murmur at RUSB VASCULAR: Radial and DP pulses 2+ bilaterally. No carotid bruits RESPIRATORY:  Clear to auscultation without rales, wheezing or rhonchi  ABDOMEN: Soft, non-tender, non-distended MUSCULOSKELETAL:  Ambulates independently SKIN: Warm and dry, no edema NEUROLOGIC:  Alert  and oriented x 3. No focal neuro deficits noted. PSYCHIATRIC:  Normal affect    ASSESSMENT:    1. Aortic valve endocarditis   2. Hypertension, benign   3. Morbidly obese ([place])   4. Pre-procedure lab exam   5. Severe aortic regurgitation   6. Bicuspid aortic valve    PLAN:    Aortic valve endocarditis Severe aortic regurgitation Bicuspid aortic valve -completing antibiotic course next week -has significant dyspnea on exertion and fatigue. Suspect this is multifactorial, but severe aortic regurgitation likely contributing. -we discussed TEE as well as R/LHC at length today -start with TEE, if still severe AI, proceed with R/LHC. She will need to schedule when her son can drive her. -will need to see CT surgery after TEE and cath to discuss surgery if indicated  Shared Decision Making/Informed Consent The risks [stroke (1 in 1000), death (1 in 1000), kidney failure [usually temporary] (1 in 500), bleeding (  1 in 200), allergic reaction [possibly serious] (1 in 200)], benefits (diagnostic support and management of coronary artery disease) and alternatives of a cardiac catheterization were discussed in detail with Mary Chambers and she is willing to proceed.   The risks [esophageal damage, perforation (1:10,000 risk), bleeding, pharyngeal hematoma as well as other potential complications associated with conscious sedation including aspiration, arrhythmia, respiratory failure and death], benefits (treatment guidance and diagnostic support) and alternatives of a transesophageal echocardiogram were discussed in detail with Mary Chambers and she is willing to proceed.    Hypertension: -at goal on current regimen, continue  Morbid obesity: BMI 40  Cardiac risk counseling and prevention recommendations: -recommend heart healthy/Mediterranean diet, with whole grains, fruits, vegetable, fish, lean meats, nuts, and olive oil. Limit salt. -recommend moderate walking, 3-5  times/week for 30-50 minutes each session. Aim for at least 150 minutes.week. Goal should be pace of 3 miles/hours, or walking 1.5 miles in 30 minutes -recommend avoidance of tobacco products. Avoid excess alcohol. -ASCVD risk score: The ASCVD Risk score (Arnett DK, et al., 2019) failed to calculate for the following reasons:   Cannot find a previous HDL lab   Cannot find a previous total cholesterol lab    Plan for follow up: 3 months or sooner as needed.  [name], MD, PhD, [facility]    Medication Adjustments/Labs and Tests Ordered: Current medicines are reviewed at length with the patient today.  Concerns regarding medicines are outlined above.  Orders Placed This Encounter  Procedures   CBC   Basic metabolic panel   EKG [id number]    No orders of the defined types were placed in this encounter.   Patient Instructions  Medication Instructions:  Your Physician recommend you continue on your current medication as directed.    *If you need a refill on your cardiac medications before your next appointment, please call your pharmacy*   Lab Work: Your provider has recommended lab work today (CBC, BMP). Please have this collected at [facility] [place]. The lab is open 8:00 am - 4:30 pm. Please avoid 12:00p - 1:00p for lunch hour. You do not need an appointment. Please go to [address] [place],  [phone]. This is in the Primary Care office on the 3rd floor, let them know you are there for blood work and they will direct you to the lab.  If you have labs (blood work) drawn today and your tests are completely normal, you will receive your results only by: [place] (if you have MyChart) OR A paper copy in the mail If you have any lab test that is abnormal or we need to change your treatment, we will call you to review the results.   Testing/Procedures: TEE [place]: At [facility], you and your health needs are our priority.  As part of our continuing mission to provide you with exceptional heart care, we have created designated Provider Care Teams.  These Care Teams include your primary Cardiologist (physician) and Advanced Practice Providers (APPs -  Physician Assistants and Nurse Practitioners) who all work together to provide you with the care you need, when you need it.  We recommend signing up for the patient portal called "MyChart".  Sign up information is provided on this After Visit Summary.  MyChart is used to connect with patients for Virtual Visits (Telemedicine).  Patients are able to view lab/test results, encounter notes, upcoming appointments,  etc.  Non-urgent messages can be sent to your provider as well.   To learn more about what you can do with MyChart, go to [email or web address].    Your next appointment:   3 month(s)  The format for your next appointment:   In Person  Provider:   [name], MD    You are scheduled for a TEE on Wednesday 08/31/21 with Dr. Johney Frame.  Please arrive at the [facility] (Main Entrance A) at [facility]: [address] [place], [place] [address] at 8 am   DIET: Nothing to eat or drink after midnight except a sip of water with medications (see medication instructions below)  FYI: For your safety, and to allow [place] to monitor your vital signs accurately during the surgery/procedure we request that   if you have artificial nails, gel coating, SNS etc. Please have those removed prior to your surgery/procedure. Not having the nail coverings /polish removed may result in cancellation or delay of your surgery/procedure.   Medication Instructions: No special instructions  Labs: Today (CBC, BMP)  You must have a responsible person to drive you home and stay in the waiting area during your procedure. Failure to do so could result in cancellation.  Bring your insurance cards.  *Special Note:  Every effort is made to have your procedure done on time. Occasionally there are emergencies that occur at the hospital that may cause delays. Please be patient if a delay does occur.     I,Mathew Stumpf,acting as a Education administrator for [employer], MD.,have documented all relevant documentation on the behalf of [name], MD,as directed by  [name], MD while in the presence of [name], MD.   I, [name], MD, have reviewed all documentation for this visit. The documentation on 07/27/21 for the exam, diagnosis, procedures, and orders are all accurate and complete.   Signed, [name], MD PhD 07/27/2021 2:02 PM    [place]

## 2021-07-26 NOTE — Assessment & Plan Note (Signed)
Lumbar septic arthritis and overall back is improved and no new concerns.   Inflammatory markers reviewed and ESR now in the normal range and CRP nearly so.  No clinical concerns noted.  Plan to complete 6 weeks on November 4th and stop.  rtc as needed.

## 2021-07-26 NOTE — Progress Notes (Signed)
   Subjective:    Patient ID: Mary Chambers, female    DOB: [birth year], 50 y.o.   MRN: [id number]  HPI Here for follow up of facet joint septic arthritis, paraspinous soft tissue infection and abscess and aortic valve endocarditis.   She continues on dual therapy with ceftriaxone and ampicillin and tolerating it well.  She is back to work, less pain overall and no associated rash or diarrhea.  No complaints.     Review of Systems  Constitutional:  Negative for chills, fatigue and fever.  Gastrointestinal:  Negative for diarrhea and nausea.  Skin:  Negative for rash.      Objective:   Physical Exam Eyes:     General: No scleral icterus. Cardiovascular:     Rate and Rhythm: Normal rate and regular rhythm.  Pulmonary:     Effort: Pulmonary effort is normal.  Neurological:     General: No focal deficit present.     Mental Status: She is alert.   SH: no tobacco       Assessment & Plan:

## 2021-07-27 ENCOUNTER — Encounter (HOSPITAL_BASED_OUTPATIENT_CLINIC_OR_DEPARTMENT_OTHER): Payer: Self-pay | Admitting: Cardiology

## 2021-07-27 DIAGNOSIS — Q2381 Bicuspid aortic valve: Secondary | ICD-10-CM

## 2021-07-27 DIAGNOSIS — I351 Nonrheumatic aortic (valve) insufficiency: Secondary | ICD-10-CM | POA: Insufficient documentation

## 2021-07-27 DIAGNOSIS — Q231 Congenital insufficiency of aortic valve: Secondary | ICD-10-CM | POA: Insufficient documentation

## 2021-07-27 HISTORY — DX: Bicuspid aortic valve: Q23.81

## 2021-07-27 HISTORY — DX: Congenital insufficiency of aortic valve: Q23.1

## 2021-07-27 LAB — BASIC METABOLIC PANEL
BUN/Creatinine Ratio: 14 (ref 9–23)
BUN: 13 mg/dL (ref 6–24)
CO2: 21 mmol/L (ref 20–29)
Calcium: 9.4 mg/dL (ref 8.7–10.2)
Chloride: 105 mmol/L (ref 96–106)
Creatinine, Ser: 0.9 mg/dL (ref 0.57–1.00)
Glucose: 102 mg/dL — ABNORMAL HIGH (ref 70–99)
Potassium: 3.9 mmol/L (ref 3.5–5.2)
Sodium: 143 mmol/L (ref 134–144)
eGFR: 77 mL/min/{1.73_m2} (ref >59)

## 2021-07-27 LAB — CBC
Hematocrit: 38 % (ref 34.0–46.6)
Hemoglobin: 12.4 g/dL (ref 11.1–15.9)
MCH: 26.7 pg (ref 26.6–33.0)
MCHC: 32.6 g/dL (ref 31.5–35.7)
MCV: 82 fL (ref 79–97)
Platelets: 312 10*3/uL (ref 150–450)
RBC: 4.65 x10E6/uL (ref 3.77–5.28)
RDW: 13.9 % (ref 11.7–15.4)
WBC: 8.7 10*3/uL (ref 3.4–10.8)

## 2021-08-01 ENCOUNTER — Telehealth (INDEPENDENT_AMBULATORY_CARE_PROVIDER_SITE_OTHER): Payer: 59 | Admitting: Internal Medicine

## 2021-08-01 ENCOUNTER — Other Ambulatory Visit: Payer: Self-pay

## 2021-08-01 DIAGNOSIS — R509 Fever, unspecified: Secondary | ICD-10-CM

## 2021-08-01 NOTE — Telephone Encounter (Signed)
Spoke with patient and scheduled virtual appt with Dr. Candiss Norse today at 345. Patient states that fever started last Wednesday. Temp has been running between 99.8-100.4. Is taking something for acid reflux.  Leatrice Jewels, RMA

## 2021-08-01 NOTE — Progress Notes (Addendum)
Virtual Visit via Video Note   I connected with on 08/01/21 by video and verified that I am speaking with the correct person using two identifiers.   I discussed the limitations, risks, security and privacy concerns of performing an evaluation and management service by video and the availability of in person appointments. I also discussed with the patient that there may be a patient responsible charge related to this service. The patient expressed understanding and agreed to proceed.  Location:  Patient: Home Provider: Clinic   Patient Active Problem List   Diagnosis Date Noted   Severe aortic regurgitation 07/27/2021   Bicuspid aortic valve 07/27/2021   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   Paraspinal abscess ([place]) 07/26/2021   Vertebral osteomyelitis ([place])    Septic arthritis ([place]) 06/23/2021   Transaminitis 06/23/2021   Abscess 06/23/2021   COVID-19 virus infection 06/23/2021   Gastroesophageal reflux disease 12/15/2020   Screening for colorectal cancer 12/15/2020   History of gastric ulcer 12/15/2020   Herniated lumbar disc without myelopathy 10/02/2019   Lumbar spinal stenosis 10/01/2019   Pain in left foot 03/29/2018   Arthritis 02/13/2018   Chronic back pain 02/13/2018   Migraines 02/13/2018   Morbidly obese ([place]) 02/13/2018   Synovial cyst of lumbar spine 10/15/2017   Osteoarthritis of knee 07/13/2017   Lumbar disc disease with radiculopathy 08/01/2016   Headache 05/04/2016   Numbness of foot 05/04/2016   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   Arthrodesis status 02/02/2016   Chronic pain syndrome 02/02/2016   Lumbar stenosis with neurogenic claudication 05/07/2015   Bilateral leg edema 04/21/2015   Carpal tunnel syndrome of right wrist 12/14/2014   Renal atrophy, left 01/23/2014   Achilles tendonitis 01/16/2014   Right lower quadrant pain 12/18/2013   Hypertension, benign 06/03/2010   Urinary incontinence 06/03/2010   Cervical disc disease 09/09/2009   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 As a Continuous Infusion Indication:  Bateremia/Endocardits/Paraspinal Abscess First Dose: Yes Last Day of Therapy:  08/05/21 Labs - Once weekly:  CBC/D and BMP, Labs - Every other week:  ESR and CRP Method of administration: Ambulatory Pump (Continuous Infusion) Method of administration may be changed at the discretion of home infusion pharmacist based upon assessment of the patient and/or caregiver's ability to self-administer the medication ordered.   ASCORBIC ACID (VITAMIN C) 500 MG CAPS    Take 500 mg by mouth daily.   ATORVASTATIN (LIPITOR) 10 MG TABLET    Take 10 mg by mouth daily.   CEFTRIAXONE (ROCEPHIN) IVPB    Inject 2 g into the vein every 12 (twelve) hours. Indication:  Bateremia/Endocardits/Paraspinal Abscess First Dose: Yes Last Day of Therapy:  08/05/21 Labs - Once weekly:  CBC/D and BMP, Labs - Every other week:  ESR and CRP Method of administration: IV Push Method of administration may be changed at the discretion of home infusion pharmacist based upon assessment of the patient and/or caregiver's ability to self-administer the medication ordered.   CHOLECALCIFEROL (VITAMIN D3) 25 MCG (1000 UT) TABLET    Take 1,000 Units by mouth daily.   ENALAPRIL (VASOTEC) 10 MG TABLET    Take 10 mg by mouth daily.   FLUTICASONE (FLONASE) 50 MCG/ACT NASAL SPRAY    Place 2 sprays into both nostrils daily.   FUROSEMIDE (LASIX) 20 MG TABLET    Take 20 mg  by mouth daily.   HYDROCHLOROTHIAZIDE (HYDRODIURIL) 25 MG TABLET    Take 25 mg by mouth daily.   LOPERAMIDE (IMODIUM) 2 MG CAPSULE    Take 1 capsule (2 mg total) by mouth as needed for diarrhea or loose stools.   MULTIPLE VITAMIN (MULTIVITAMIN) CAPSULE    Take 1 capsule by mouth daily.    NAPROXEN (NAPROSYN) 250 MG TABLET    Take 250 mg by mouth 2 (two) times daily.    PANTOPRAZOLE (PROTONIX) 40 MG TABLET    Take 40 mg by mouth daily.   TIZANIDINE (ZANAFLEX) 4 MG TABLET    Take 4 mg by mouth 2 (two) times daily as needed (for back pain/sleep).   TRAMADOL (ULTRAM) 50 MG TABLET    Take 1 tablet (50 mg total) by mouth every 6 (six) hours as needed.  Modified Medications   No medications on file  Discontinued Medications   No medications on file    Subjective: 50YF presents for televisit for fever. She was seen on 07/26/21 by ID for facet joint septic arthritis, paraspinous soft tissue infection and abscess and aortic valve endocarditis on ceftriaxone and ampicillin x 6 weeks (EOT 08/05/21). She reports a low grade fever for the past few days. Tmax on Saturday of 100.4. Home health had stopped by today and changed PICC line dressing, she had a temp of 99.8 and advised to ID.  Pt states her arm is slightly red around line site, which could not be appreciated on the video call. She reports associated nausea which is unusual for her.  Review of Systems: Review of Systems  Constitutional:  Positive for fever.  Respiratory: Negative.  Negative for cough, shortness of breath and wheezing.   Cardiovascular: Negative.  Negative for chest pain and palpitations.  Gastrointestinal:  Positive for nausea.  Skin:  Positive for rash (around PICC site).  Neurological:  Negative for dizziness and headaches.   Past Medical History:  Diagnosis Date   Arthritis    Depression    Hypertension    Kidney stone    Obesity     Social History   Tobacco Use   Smoking status: Never   Smokeless tobacco: Never  Vaping Use   Vaping Use: Never used  Substance Use Topics   Alcohol use: No   Drug use: Never    Family History  Problem Relation Age of Onset   Diabetes Mother    Hypertension Mother    Cancer Father    Parkinson's disease Father    Heart disease Father    Heart disease Sister    Heart disease Brother    Colon cancer Neg Hx    Rectal cancer Neg Hx     Stomach cancer Neg Hx     Allergies  Allergen Reactions   Baclofen Hives   Doxycycline Itching   Percocet [Oxycodone-Acetaminophen] Hives   Septra [Sulfamethoxazole-Trimethoprim] Hives   Gabapentin Rash    Health Maintenance  Topic Date Due   COVID-19 Vaccine (1) Never done   PAP SMEAR-Modifier  Never done   MAMMOGRAM  Never done   Zoster Vaccines- Shingrix (1 of 2) Never done   INFLUENZA VACCINE  05/02/2021   TETANUS/TDAP  08/22/2025   COLONOSCOPY (Pts 45-58yr Insurance coverage will need to be confirmed)  02/05/2028   Hepatitis C Screening  Completed   HIV Screening  Completed   Pneumococcal Vaccine [id number] old  Aged Out   HPV VACCINES  Aged Out    Objective:  Physical  Exam Constitutional:      Appearance: Normal appearance.  HENT:     Head: Normocephalic and atraumatic.  Musculoskeletal:     Cervical back: Normal range of motion.  Skin:    Comments: PICC line site intact  Neurological:     Mental Status: She is alert.    Lab Results Lab Results  Component Value Date   WBC 8.7 07/26/2021   HGB 12.4 07/26/2021   HCT 38.0 07/26/2021   MCV 82 07/26/2021   PLT 312 07/26/2021    Lab Results  Component Value Date   CREATININE 0.90 07/26/2021   BUN 13 07/26/2021   NA 143 07/26/2021   K 3.9 07/26/2021   CL 105 07/26/2021   CO2 21 07/26/2021    Lab Results  Component Value Date   ALT 23 07/14/2021   AST 25 07/14/2021   ALKPHOS 87 07/14/2021   BILITOT 0.4 07/14/2021    No results found for: CHOL, HDL, LDLCALC, LDLDIRECT, TRIG, CHOLHDL No results found for: LABRPR, RPRTITER No results found for: HIV1RNAQUANT, HIV1RNAVL, CD4TABS   Assessment/Plan #Fever  #Facet joint septic arthritis, paraspinous soft tissue infection and abscess and aortic valve endocarditis on ceftriaxone and ampicillinlin x 6 weeks (EOT 08/05/21) -Reports nausea/night sweats(going though menopause),fever for the last few days. She had a tmax of 100.4 on Saturday. She has a PICC  line in, she states it looks slightly erythematous but non-tender. Would like her evaluated  with labs.  Plan: -Advised pt to go to ED for further evaluation -Placed labs(blood Cx X 2, cbc, cmp). She plans on going to [facility] on [address] follow labs with possible follow-up pending evaluation    Laurice Record, [place] 08/01/2021, 3:45 PM

## 2021-08-02 ENCOUNTER — Other Ambulatory Visit (HOSPITAL_BASED_OUTPATIENT_CLINIC_OR_DEPARTMENT_OTHER): Payer: Self-pay | Admitting: Emergency Medicine

## 2021-08-02 ENCOUNTER — Ambulatory Visit (HOSPITAL_COMMUNITY): Payer: 59

## 2021-08-02 ENCOUNTER — Emergency Department (HOSPITAL_BASED_OUTPATIENT_CLINIC_OR_DEPARTMENT_OTHER): Payer: 59

## 2021-08-02 ENCOUNTER — Ambulatory Visit (HOSPITAL_COMMUNITY)
Admission: RE | Admit: 2021-08-02 | Discharge: 2021-08-02 | Disposition: A | Discharge status: Home / Self Care | Payer: 59 | Source: Ambulatory Visit | Attending: Emergency Medicine | Admitting: Emergency Medicine

## 2021-08-02 ENCOUNTER — Emergency Department (HOSPITAL_BASED_OUTPATIENT_CLINIC_OR_DEPARTMENT_OTHER)
Admission: EM | Admit: 2021-08-02 | Discharge: 2021-08-02 | Disposition: A | Discharge status: Home / Self Care | Payer: 59 | Attending: Emergency Medicine | Admitting: Emergency Medicine

## 2021-08-02 ENCOUNTER — Encounter (HOSPITAL_BASED_OUTPATIENT_CLINIC_OR_DEPARTMENT_OTHER): Payer: Self-pay

## 2021-08-02 ENCOUNTER — Other Ambulatory Visit: Payer: Self-pay

## 2021-08-02 DIAGNOSIS — T829XXA Unspecified complication of cardiac and vascular prosthetic device, implant and graft, initial encounter: Secondary | ICD-10-CM | POA: Insufficient documentation

## 2021-08-02 DIAGNOSIS — I10 Essential (primary) hypertension: Secondary | ICD-10-CM | POA: Insufficient documentation

## 2021-08-02 DIAGNOSIS — T82594A Other mechanical complication of infusion catheter, initial encounter: Secondary | ICD-10-CM | POA: Diagnosis not present

## 2021-08-02 DIAGNOSIS — Z8616 Personal history of COVID-19: Secondary | ICD-10-CM | POA: Diagnosis not present

## 2021-08-02 DIAGNOSIS — R12 Heartburn: Secondary | ICD-10-CM | POA: Diagnosis present

## 2021-08-02 DIAGNOSIS — Z79899 Other long term (current) drug therapy: Secondary | ICD-10-CM | POA: Insufficient documentation

## 2021-08-02 DIAGNOSIS — R509 Fever, unspecified: Secondary | ICD-10-CM | POA: Diagnosis not present

## 2021-08-02 DIAGNOSIS — R52 Pain, unspecified: Secondary | ICD-10-CM

## 2021-08-02 DIAGNOSIS — Y848 Other medical procedures as the cause of abnormal reaction of the patient, or of later complication, without mention of misadventure at the time of the procedure: Secondary | ICD-10-CM | POA: Insufficient documentation

## 2021-08-02 DIAGNOSIS — R110 Nausea: Secondary | ICD-10-CM | POA: Diagnosis not present

## 2021-08-02 DIAGNOSIS — Z882 Allergy status to sulfonamides status: Secondary | ICD-10-CM | POA: Diagnosis not present

## 2021-08-02 DIAGNOSIS — Z888 Allergy status to other drugs, medicaments and biological substances status: Secondary | ICD-10-CM | POA: Insufficient documentation

## 2021-08-02 DIAGNOSIS — Z881 Allergy status to other antibiotic agents status: Secondary | ICD-10-CM | POA: Insufficient documentation

## 2021-08-02 DIAGNOSIS — Z885 Allergy status to narcotic agent status: Secondary | ICD-10-CM | POA: Diagnosis not present

## 2021-08-02 LAB — COMPREHENSIVE METABOLIC PANEL
ALT: 53 U/L — ABNORMAL HIGH (ref 0–44)
AST: 30 U/L (ref 15–41)
Albumin: 3.4 g/dL — ABNORMAL LOW (ref 3.5–5.0)
Alkaline Phosphatase: 83 U/L (ref 38–126)
Anion gap: 12 (ref 5–15)
BUN: 17 mg/dL (ref 6–20)
CO2: 28 mmol/L (ref 22–32)
Calcium: 8 mg/dL — ABNORMAL LOW (ref 8.9–10.3)
Chloride: 102 mmol/L (ref 98–111)
Creatinine, Ser: 0.8 mg/dL (ref 0.44–1.00)
GFR, Estimated: >60 mL/min (ref >60)
Glucose, Bld: 82 mg/dL (ref 70–99)
Potassium: 3.6 mmol/L (ref 3.5–5.1)
Sodium: 142 mmol/L (ref 135–145)
Total Bilirubin: 0.3 mg/dL (ref 0.3–1.2)
Total Protein: 6.9 g/dL (ref 6.5–8.1)

## 2021-08-02 LAB — CBC WITH DIFFERENTIAL/PLATELET
Abs Immature Granulocytes: 0.02 10*3/uL (ref 0.00–0.07)
Basophils Absolute: 0.1 10*3/uL (ref 0.0–0.1)
Basophils Relative: 1 %
Eosinophils Absolute: 0.5 10*3/uL (ref 0.0–0.5)
Eosinophils Relative: 7 %
HCT: 35.4 % — ABNORMAL LOW (ref 36.0–46.0)
Hemoglobin: 11.1 g/dL — ABNORMAL LOW (ref 12.0–15.0)
Immature Granulocytes: 0 %
Lymphocytes Relative: 29 %
Lymphs Abs: 2 10*3/uL (ref 0.7–4.0)
MCH: 26.2 pg (ref 26.0–34.0)
MCHC: 31.4 g/dL (ref 30.0–36.0)
MCV: 83.5 fL (ref 80.0–100.0)
Monocytes Absolute: 0.5 10*3/uL (ref 0.1–1.0)
Monocytes Relative: 7 %
Neutro Abs: 4 10*3/uL (ref 1.7–7.7)
Neutrophils Relative %: 56 %
Platelets: 285 10*3/uL (ref 150–400)
RBC: 4.24 MIL/uL (ref 3.87–5.11)
RDW: 14 % (ref 11.5–15.5)
WBC: 7.1 10*3/uL (ref 4.0–10.5)
nRBC: 0 % (ref 0.0–0.2)

## 2021-08-02 LAB — URINALYSIS, ROUTINE W REFLEX MICROSCOPIC
Bilirubin Urine: NEGATIVE
Glucose, UA: NEGATIVE mg/dL
Hgb urine dipstick: NEGATIVE
Ketones, ur: NEGATIVE mg/dL
Leukocytes,Ua: NEGATIVE
Nitrite: NEGATIVE
Protein, ur: NEGATIVE mg/dL
Specific Gravity, Urine: 1.011 (ref 1.005–1.030)
pH: 6 (ref 5.0–8.0)

## 2021-08-02 IMAGING — US IR PICC >5YO
2 series · 2 of 2 positions shown · non-contrast
Comparison: none

INDICATION: PICC line malfunction

[Series 1: ir picc repl rtinc imgguide · 1 of 1 slices shown]
[im 1/1]
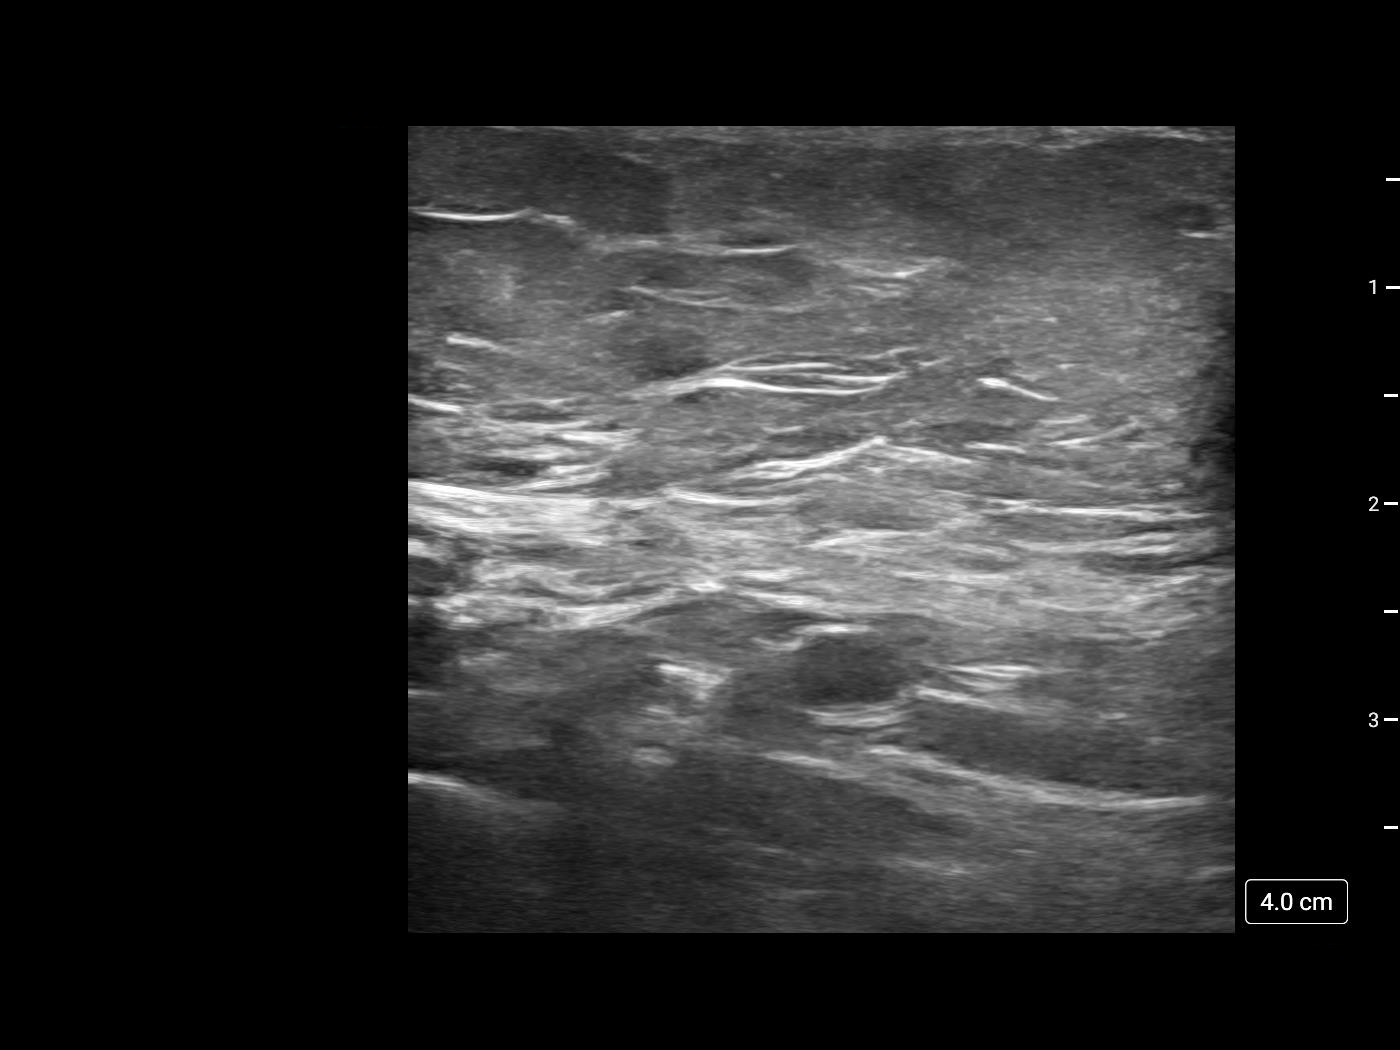

[Series 4: fl neuro · 1 of 1 slices shown]
[im 1/1]
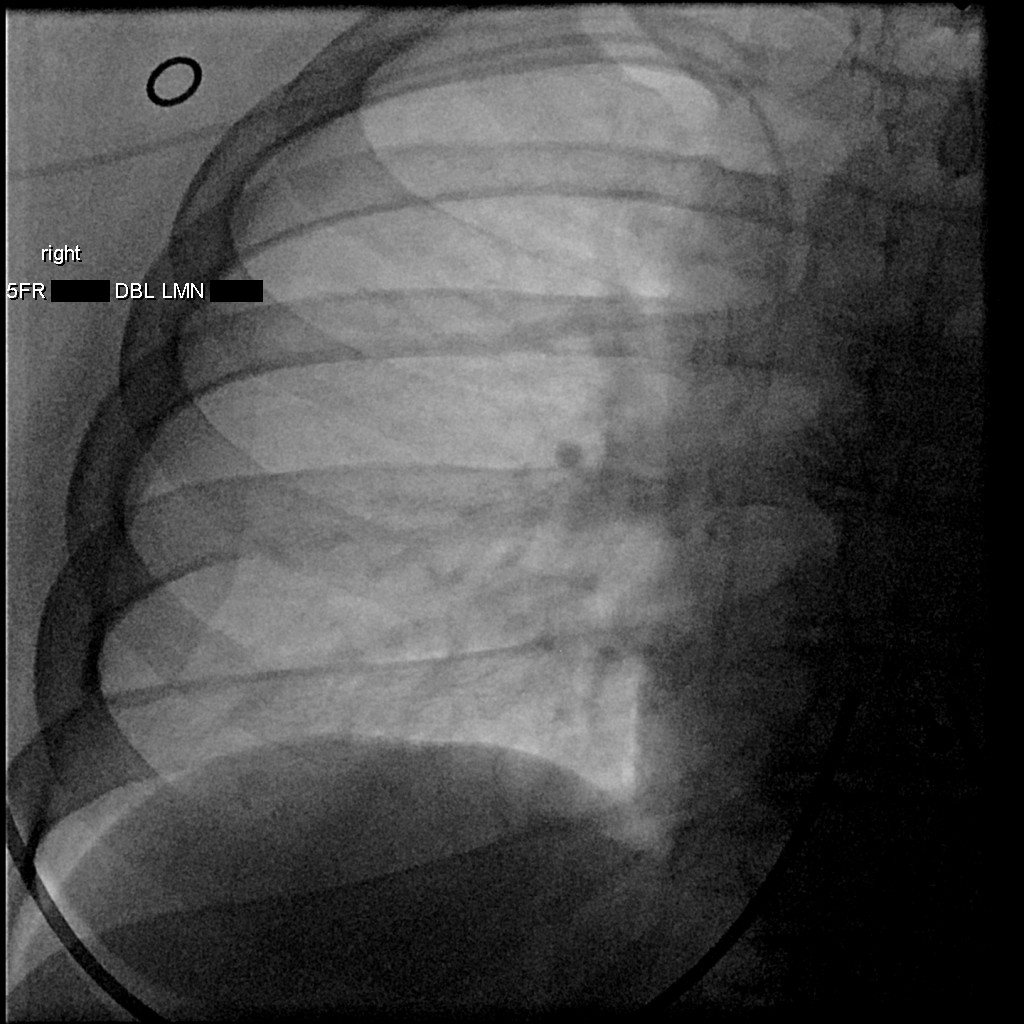

[2 of 2 positions shown; findings below may reference images not displayed]

EXAM:
Right PICC LINE PLACEMENT WITH ULTRASOUND AND FLUOROSCOPIC GUIDANCE

MEDICATIONS:
None

ANESTHESIA/SEDATION:
Local analgesia

FLUOROSCOPY TIME:  Fluoroscopy Time: 17 minutes 6 seconds with 1
exposure

COMPLICATIONS:
None immediate.

PROCEDURE:
Informed written consent was obtained from the patient after a
thorough discussion of the procedural risks, benefits and
alternatives. All questions were addressed. Maximal Sterile Barrier
Technique was utilized including caps, mask, sterile gowns, sterile
gloves, sterile drape, hand hygiene and skin antiseptic. A timeout
was performed prior to the initiation of the procedure.

The patient was placed supine on the exam table. The patient
presented with a malfunctioning right upper extremity PICC. Prior to
my arrival to the procedure room, the physician assistant under my
supervision had attempted re wire of the malfunctioning right upper
extremity PICC. However, advancement of the new PICC through the
existing access was unsuccessful. Small contrast injection through
the PICC demonstrated stenosis versus thrombosis of the current
access vessel. The decision was then made to proceed with a new PICC
placement. The PICC was removed and a new access site was selected.

The right upper extremity was prepped and draped in a standard
sterile fashion. Ultrasound was used to evaluate the right upper
extremity veins, which demonstrated a widely patent right basilic
vein. Skin entry site was marked, and local analgesia was obtained
with 1% lidocaine. A small dermatotomy was made. Using ultrasound
guidance, the identified upper extremity vein was directly punctured
using a 21 gauge micropuncture [DATE] wire was easily advanced
towards the central veins, and measurements were made. A 5 French
double-lumen PICC was trimmed to the appropriate length of 44 cm,
and then advanced through a peel-away sheath towards the central
veins such that the tip overlies the superior cavoatrial junction.
The PICC was found to aspirate and flush appropriately. It was
secured to the skin using a StatLock dressing, and a sterile
dressing was placed. The patient tolerated the procedure well
without immediate complication.
IMPRESSION: Successful placement of a new 5 French double-lumen PICC using the
right basilic vein. The line is ready for immediate use.

## 2021-08-02 IMAGING — US US EXTREM  UP VENOUS*R*
1 series · 13 of 24 positions shown · non-contrast
Comparison: [DATE], [DATE]

CLINICAL DATA: 50-year-old female with history of malfunctioning
PICC line.



[Series 1: us venous img upper uni right (dvt) · portal-venous · 13 of 41 slices shown]
[im 1/41]
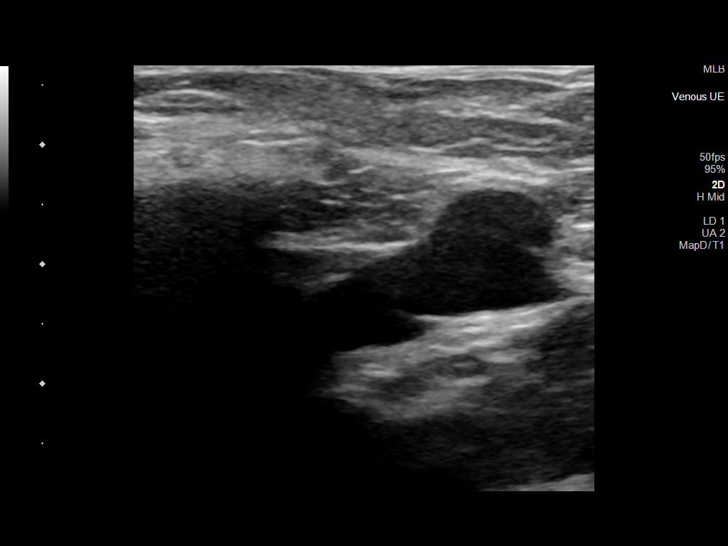
[im 4/41]
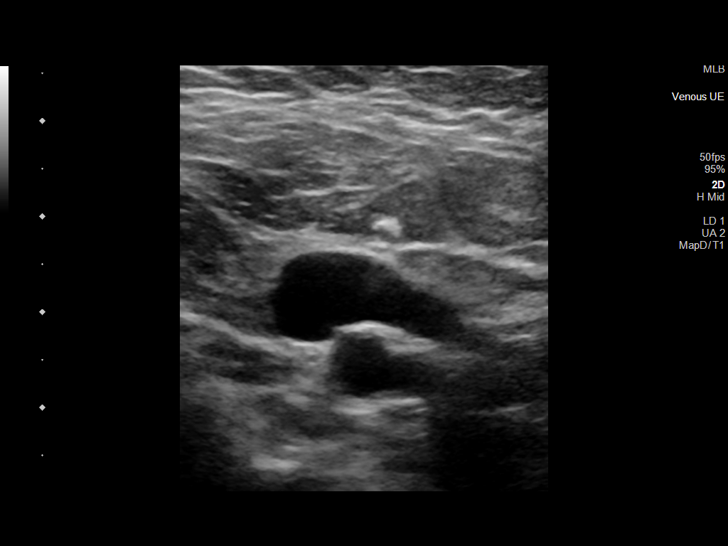
[im 7/41]
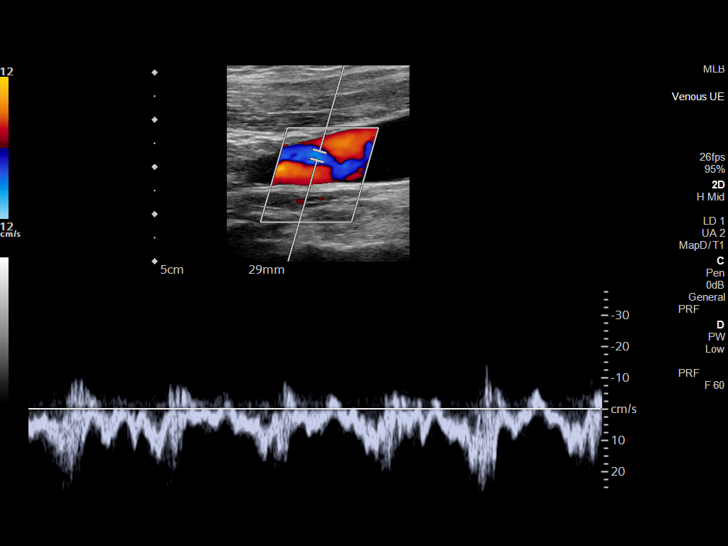
[im 11/41]
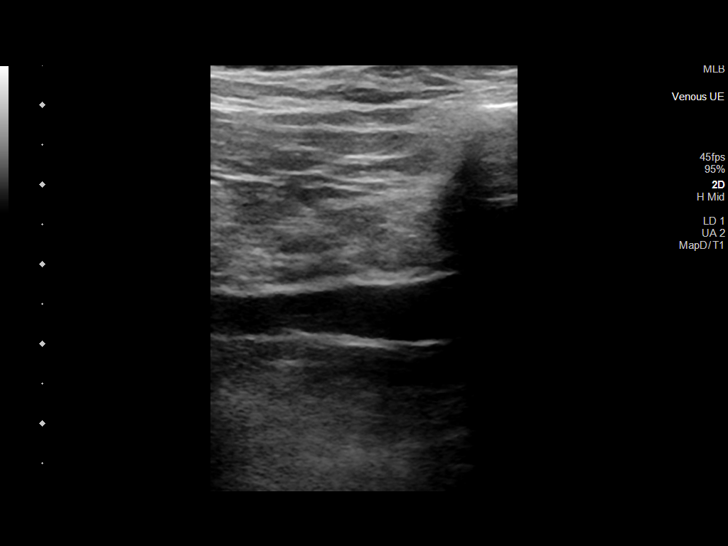
[im 14/41]
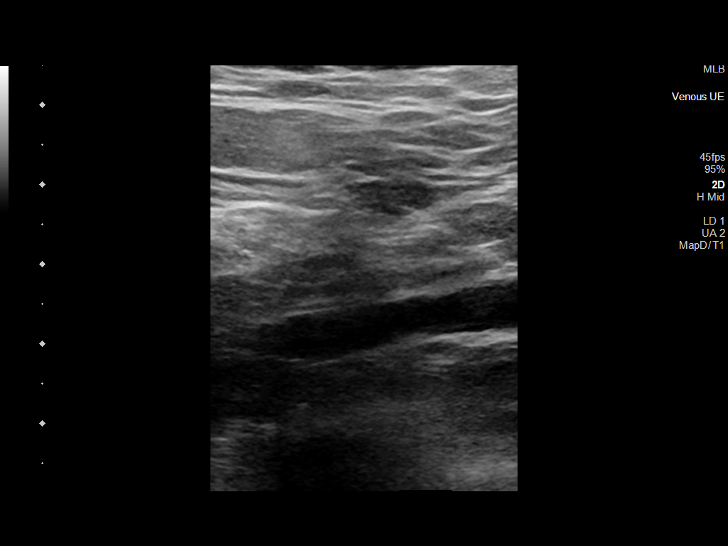
[im 18/41]
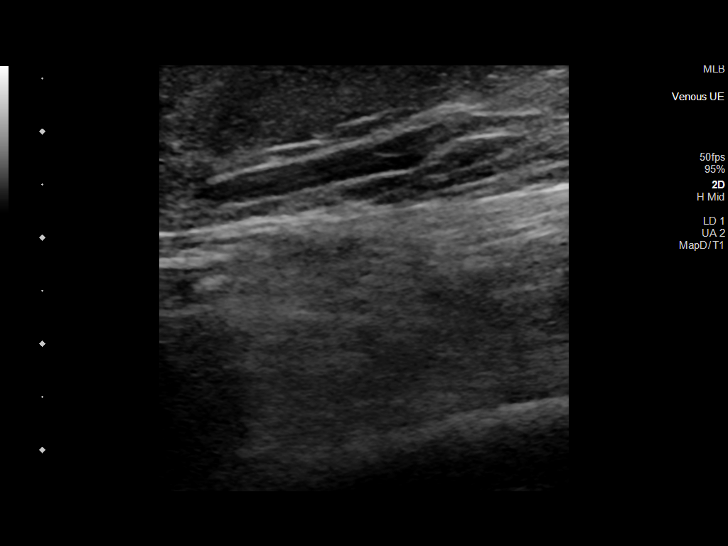
[im 21/41]
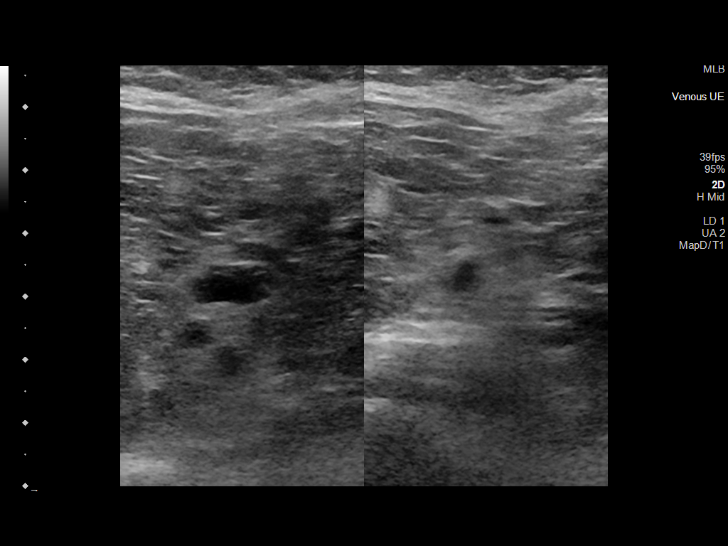
[im 23/41]
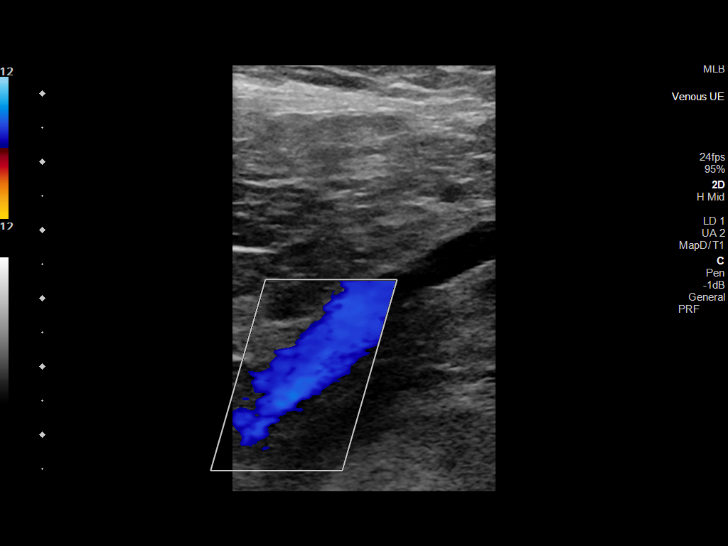
[im 27/41]
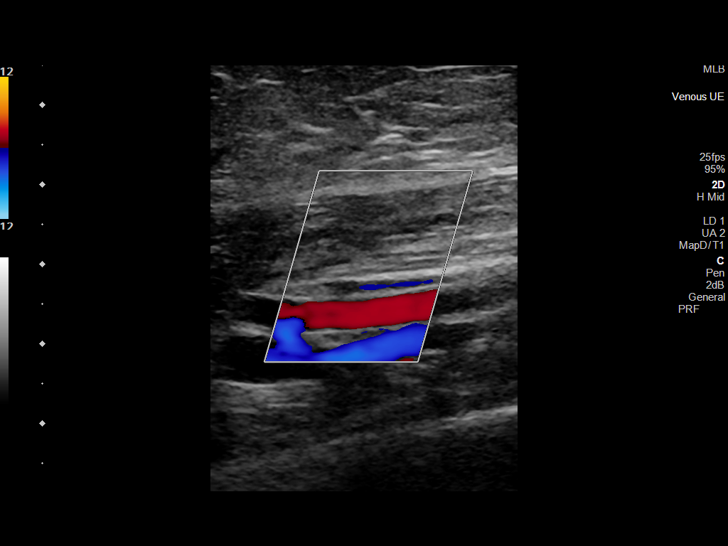
[im 30/41]
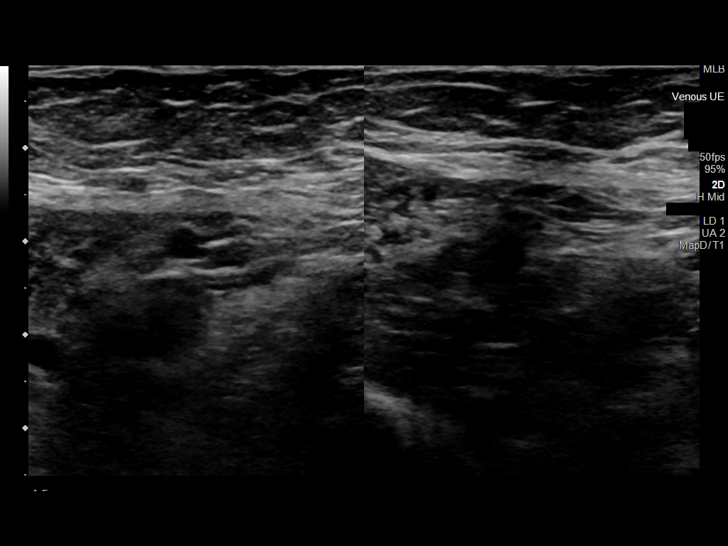
[im 34/41]
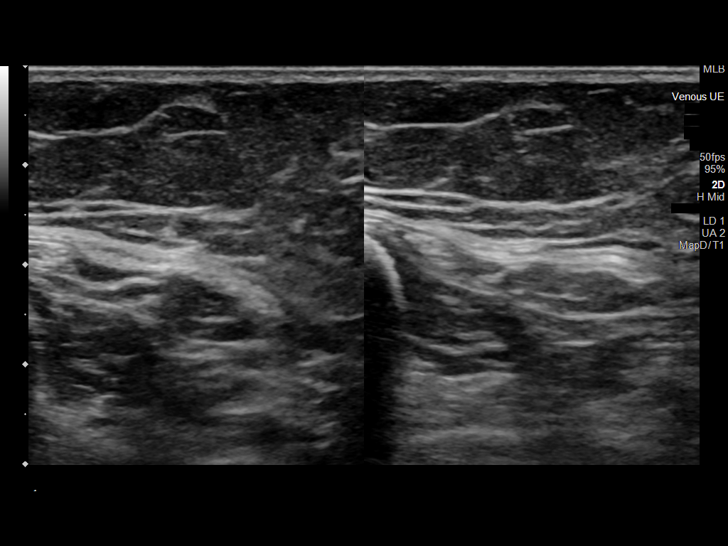
[im 37/41]
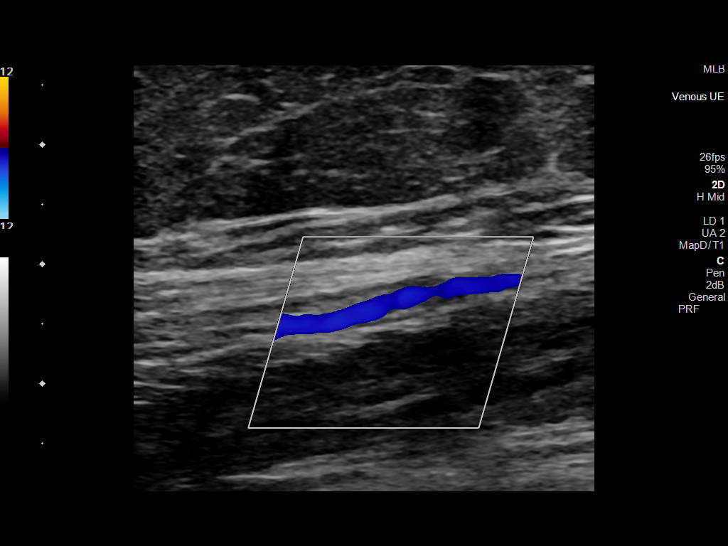
[im 41/41]
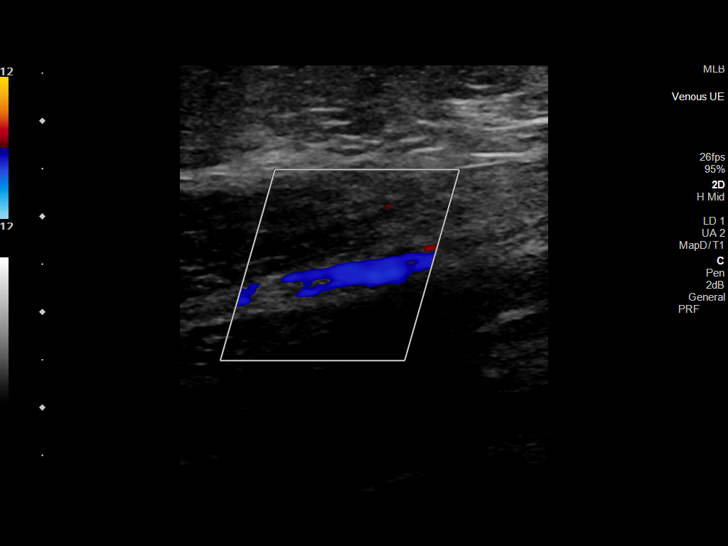

[13 of 24 positions shown; findings below may reference images not displayed]

FINDINGS: Contralateral Subclavian Vein: Respiratory phasicity is normal and
symmetric with the symptomatic side. No evidence of thrombus. Normal
compressibility.

Internal Jugular Vein: No evidence of thrombus. Normal
compressibility, respiratory phasicity and response to augmentation.

Subclavian Vein: No evidence of thrombus. Normal compressibility,
respiratory phasicity and response to augmentation.

Axillary Vein: No evidence of thrombus. Normal compressibility,
respiratory phasicity and response to augmentation.

Cephalic Vein: No evidence of thrombus. Normal compressibility,
respiratory phasicity and response to augmentation.

Basilic Vein: No evidence of thrombus. Normal compressibility,
respiratory phasicity and response to augmentation.

Brachial Veins: No evidence of thrombus. Normal compressibility,
respiratory phasicity and response to augmentation.

Radial Veins: No evidence of thrombus. Normal compressibility,
respiratory phasicity and response to augmentation.

Ulnar Veins: No evidence of thrombus. Normal compressibility,
respiratory phasicity and response to augmentation.

Other Findings: None visualized. The indwelling right upper
extremity PICC is not well visualized.
IMPRESSION: No evidence of right upper extremity deep vein thrombosis.

## 2021-08-02 MED ORDER — SODIUM CHLORIDE 0.9 % IV BOLUS
1000.0000 mL | Freq: Once | INTRAVENOUS | Status: AC
  Administered 2021-08-02: 1000 mL via INTRAVENOUS

## 2021-08-02 MED ORDER — HEPARIN SOD (PORK) LOCK FLUSH 100 UNIT/ML IV SOLN
INTRAVENOUS | Status: AC
  Administered 2021-08-02: 500 [IU]
  Filled 2021-08-02: qty 5

## 2021-08-02 MED ORDER — IOHEXOL 300 MG/ML  SOLN
100.0000 mL | Freq: Once | INTRAMUSCULAR | Status: AC | PRN
  Administered 2021-08-02: 20 mL via INTRA_ARTERIAL

## 2021-08-02 MED ORDER — LIDOCAINE HCL 1 % IJ SOLN
INTRAMUSCULAR | Status: AC
  Filled 2021-08-02: qty 20

## 2021-08-02 MED ORDER — CHLORHEXIDINE GLUCONATE 4 % EX LIQD
CUTANEOUS | Status: AC
  Filled 2021-08-02: qty 15

## 2021-08-02 NOTE — ED Triage Notes (Signed)
Pt arrives POV.  Pt has 2 port PICC line in right arm, states red port no longer works.  Receiving ampicillin continuous infusion in purple port.  Pt states she was hospitalized for sepsis 6 weeks ago.  Approximately 1 week ago she began running fevers, having nausea and severe heartburn.  States her MD sent her here for blood work.

## 2021-08-02 NOTE — ED Provider Notes (Signed)
[facility] EMERGENCY DEPT Provider Note   CSN: [id number] Arrival date & time: 08/02/21  4034     History Chief Complaint  Patient presents with   Nausea   Heartburn    Mary Chambers is a 50 y.o. female.  HPI 50 year old female presents for evaluation of fevers as well as issues with her PICC line.  She is on IV antibiotics and due to be done this week.  She is on continuous ampicillin as well as once daily Rocephin.  10 days ago she had a low-grade fever in the 99 range.  She has been dealing with nausea and heartburn for about a week.  3 days ago she noticed a temperature of 100.2 or 100.4.  Yesterday when the home health nurse checked on her she had a temperature of 99.8 and ID was consulted and they recommended getting blood cultures and labs.  The patient has noticed a small red dot at the PICC line insertion site for about a week but is bigger today.  She also noticed that when she was giving herself Rocephin last night she had a lot of difficulty and could only get a couple cc in.  She denies any other obvious causes of fever and denies any headache, cough, sore throat, abdominal pain, urinary symptoms, rash.  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   Paraspinal abscess ([place]) 07/26/2021   Vertebral osteomyelitis ([place])    Septic arthritis ([place]) 06/23/2021   Transaminitis 06/23/2021   Abscess 06/23/2021   COVID-19 virus infection 06/23/2021   Gastroesophageal reflux disease 12/15/2020   Screening for colorectal cancer 12/15/2020   History of gastric ulcer 12/15/2020   Herniated lumbar disc without myelopathy 10/02/2019   Lumbar spinal stenosis 10/01/2019   Pain in left foot 03/29/2018   Arthritis 02/13/2018    Chronic back pain 02/13/2018   Migraines 02/13/2018   Morbidly obese ([place]) 02/13/2018   Synovial cyst of lumbar spine 10/15/2017   Osteoarthritis of knee 07/13/2017   Lumbar disc disease with radiculopathy 08/01/2016   Headache 05/04/2016   Numbness of foot 05/04/2016   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   Arthrodesis status 02/02/2016   Chronic pain syndrome 02/02/2016   Lumbar stenosis with neurogenic claudication 05/07/2015   Bilateral leg edema 04/21/2015   Carpal tunnel syndrome of right wrist 12/14/2014   Renal atrophy, left 01/23/2014   Achilles tendonitis 01/16/2014   Right lower quadrant pain 12/18/2013   Hypertension, benign 06/03/2010   Urinary incontinence 06/03/2010   Cervical disc disease 09/09/2009   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     Family History  Problem Relation  Age of Onset   Diabetes Mother    Hypertension Mother    Cancer Father    Parkinson's disease Father    Heart disease Father    Heart disease Sister    Heart disease Brother    Colon cancer Neg Hx    Rectal cancer Neg Hx    Stomach cancer Neg Hx     Social History   Tobacco Use   Smoking status: Never   Smokeless tobacco: Never  Vaping Use   Vaping Use: Never used  Substance Use Topics   Alcohol use: No   Drug use: Never    Home Medications Prior to Admission medications   Medication Sig Start Date End Date Taking? Authorizing Provider  ampicillin IVPB Inject 12 g into the vein  daily. As a Continuous Infusion Indication:  Bateremia/Endocardits/Paraspinal Abscess First Dose: Yes Last Day of Therapy:  08/05/21 Labs - Once weekly:  CBC/D and BMP, Labs - Every other week:  ESR and CRP Method of administration: Ambulatory Pump (Continuous Infusion) Method of administration may be changed at the discretion of home infusion pharmacist based upon assessment of the patient and/or caregiver's ability to self-administer the medication ordered. 06/30/21 08/05/21 Yes Nita Sells, MD  Ascorbic Acid (VITAMIN C) 500 MG CAPS Take 500 mg by mouth daily.   Yes [provider]  atorvastatin (LIPITOR) 10 MG tablet Take 10 mg by mouth daily. 04/27/21  Yes [provider]  cefTRIAXone (ROCEPHIN) IVPB Inject 2 g into the vein every 12 (twelve) hours. Indication:  Bateremia/Endocardits/Paraspinal Abscess First Dose: Yes Last Day of Therapy:  08/05/21 Labs - Once weekly:  CBC/D and BMP, Labs - Every other week:  ESR and CRP Method of administration: IV Push Method of administration may be changed at the discretion of home infusion pharmacist based upon assessment of the patient and/or caregiver's ability to self-administer the medication ordered. Patient taking differently: Inject 2 g into the vein every 12 (twelve) hours. Indication:  Bateremia/Endocardits/Paraspinal Abscess First Dose: Yes Last Day of Therapy:  08/05/21 Labs - Once weekly:  CBC/D and BMP, Labs - Every other week:  ESR and CRP Method of administration: IV Push Method of administration may be changed at the discretion of home infusion pharmacist based upon assessment of the patient and/or caregiver's ability to self-administer the medication ordered. 06/30/21 08/05/21 Yes Nita Sells, MD  cholecalciferol (VITAMIN D3) 25 MCG (1000 UT) tablet Take 1,000 Units by mouth daily.   Yes [provider]  enalapril (VASOTEC) 10 MG tablet Take 10 mg by mouth daily.   Yes [provider]   fluticasone (FLONASE) 50 MCG/ACT nasal spray Place 2 sprays into both nostrils daily. Patient taking differently: Place 2 sprays into both nostrils daily as needed for allergies. 05/03/21  Yes Brunetta Jeans, PA-C  furosemide (LASIX) 20 MG tablet Take 20 mg by mouth daily. 05/31/21  Yes [provider]  hydrochlorothiazide (HYDRODIURIL) 25 MG tablet Take 25 mg by mouth daily.   Yes [provider]  loperamide (IMODIUM) 2 MG capsule Take 1 capsule (2 mg total) by mouth as needed for diarrhea or loose stools. 07/01/21  Yes Nita Sells, MD  Multiple Vitamin (MULTIVITAMIN) capsule Take 1 capsule by mouth daily.    Yes [provider]  naproxen (NAPROSYN) 250 MG tablet Take 250 mg by mouth 2 (two) times daily. 07/05/21  Yes [provider]  pantoprazole (PROTONIX) 40 MG tablet Take 40 mg by mouth daily. 04/22/21  Yes [provider]  tiZANidine (ZANAFLEX) 4 MG tablet Take 4 mg by mouth 2 (two) times daily as needed (for back pain/sleep). 06/20/21  Yes [provider]  traMADol (ULTRAM) 50 MG tablet Take 1 tablet (50 mg total) by mouth every 6 (six) hours as needed. 07/15/21  Yes Delo, Nathaneil Canary, MD  diphenhydrAMINE (BENADRYL) 25 mg capsule Take by mouth. 03/10/16 06/21/19  [provider]  LYRICA 50 MG capsule Take 50 mg by mouth 3 (three) times daily. 04/15/18 06/07/19  [provider]    Allergies    Baclofen, Doxycycline, Percocet [oxycodone-acetaminophen], Septra [sulfamethoxazole-trimethoprim], and Gabapentin  Review of Systems   Review of Systems  Constitutional:  Positive for fever.  HENT:  Negative for sore throat.   Respiratory:  Negative for cough.   Gastrointestinal:  Positive for nausea. Negative for abdominal pain and vomiting.  Genitourinary:  Negative for dysuria.  Skin:  Negative for rash.  Neurological:  Negative for headaches.  All other systems reviewed and are negative.  Physical Exam Updated Vital  Signs BP 120/74 (BP Location: Left Wrist) Comment (BP Location): IV L A/C-PICC R  Pulse 97   Temp 98.1 F (36.7 C) (Oral)   Resp 15   Ht _0  (1.702 m)   Wt 117.9 kg   SpO2 100%   BMI 40.72 kg/m   Physical Exam Vitals and nursing note reviewed.  Constitutional:      General: She is not in acute distress.    Appearance: She is well-developed. She is obese. She is not ill-appearing or diaphoretic.  HENT:     Head: Normocephalic and atraumatic.     Right Ear: External ear normal.     Left Ear: External ear normal.     Nose: Nose normal.  Eyes:     General:        Right eye: No discharge.        Left eye: No discharge.  Cardiovascular:     Rate and Rhythm: Normal rate and regular rhythm.     Heart sounds: Normal heart sounds.  Pulmonary:     Effort: Pulmonary effort is normal.     Breath sounds: Normal breath sounds.  Abdominal:     Palpations: Abdomen is soft.     Tenderness: There is no abdominal tenderness.  Skin:    General: Skin is warm and dry.     Comments: See picture for PICC line site.  There is some mild tenderness proximal to this but no streaking.  Neurological:     Mental Status: She is alert.  Psychiatric:        Mood and Affect: Mood is not anxious.     ED Results / Procedures / Treatments   Labs (all labs ordered are listed, but only abnormal results are displayed) Labs Reviewed  CBC WITH DIFFERENTIAL/PLATELET - Abnormal; Notable for the following components:      Result Value   Hemoglobin 11.1 (*)    HCT 35.4 (*)    All other components within normal limits  COMPREHENSIVE METABOLIC PANEL - Abnormal; Notable for the following components:   Calcium 8.0 (*)    Albumin 3.4 (*)    ALT 53 (*)    All other components within normal limits  URINALYSIS, ROUTINE W REFLEX MICROSCOPIC - Abnormal; Notable for the following components:   Color, Urine COLORLESS (*)    All other components within normal limits  CULTURE, BLOOD (SINGLE)  CULTURE, BLOOD  (SINGLE)    EKG None  Radiology US Venous Img  Upper Uni Right(DVT)  Result Date: 08/02/2021 CLINICAL DATA:  51 year old female with history of malfunctioning PICC line. EXAM: RIGHT UPPER EXTREMITY VENOUS DOPPLER ULTRASOUND TECHNIQUE: Gray-scale sonography with graded compression, as well as color Doppler and duplex ultrasound were performed to evaluate the upper extremity deep venous system from the level of the subclavian vein and including the jugular, axillary, basilic, radial, ulnar and upper cephalic vein. Spectral Doppler was utilized to evaluate flow at rest and with distal augmentation maneuvers. COMPARISON:  06/30/2021, 07/01/2021 FINDINGS: Contralateral Subclavian Vein: Respiratory phasicity is normal and symmetric with the symptomatic side. No evidence of thrombus. Normal compressibility. Internal Jugular Vein: No evidence of thrombus. Normal compressibility, respiratory phasicity and response to augmentation. Subclavian Vein: No evidence of thrombus. Normal compressibility, respiratory phasicity and response to augmentation. Axillary Vein: No evidence of thrombus. Normal compressibility, respiratory phasicity and response to augmentation. Cephalic Vein: No evidence of thrombus. Normal compressibility, respiratory phasicity and response to augmentation. Basilic Vein: No evidence of thrombus. Normal compressibility, respiratory phasicity and response to augmentation. Brachial Veins: No evidence of thrombus. Normal compressibility, respiratory phasicity and response to augmentation. Radial Veins: No evidence of thrombus. Normal compressibility, respiratory phasicity and response to augmentation. Ulnar Veins: No evidence of thrombus. Normal compressibility, respiratory phasicity and response to augmentation. Other Findings: None visualized. The indwelling right upper extremity PICC is not well visualized. IMPRESSION: No evidence of right upper extremity deep vein thrombosis. Ruthann Cancer, MD  [facility] Electronically Signed   By: Ruthann Cancer M.D.   On: 08/02/2021 10:51    Procedures Procedures   Medications Ordered in ED Medications  sodium chloride 0.9 % bolus 1,000 mL (1,000 mLs Intravenous New Bag/Given 08/02/21 0347)    ED Course  I have reviewed the triage vital signs and the nursing notes.  Pertinent labs & imaging results that were available during my care of the patient were reviewed by me and considered in my medical decision making (see chart for details).    MDM Rules/Calculators/A&P                           Vitals and labs are overall unremarkable.  No fevers here.  No DVT on her ultrasound.  However I discussed with the reading radiologist, Dr. Serafina Royals, who also help take care of her for her original PICC.  He remembers he had to cut it short a little and suspects it is getting caught on the venous wall causing difficulties using the PICC.  Thus he states that he can help replace it via wire exchange this afternoon.  She can be discharged and follow-up with IR this afternoon and they will contact her for this appointment.  Otherwise she should follow-up with ID. Final Clinical Impression(s) / ED Diagnoses Final diagnoses:  Complication associated with peripherally inserted central catheter (PICC), initial encounter    Rx / DC Orders ED Discharge Orders          Ordered    IR PICC REPLACEMENT RIGHT INC IMG GUIDE        08/02/21 1044             Sherwood Gambler, MD 08/02/21 1100

## 2021-08-02 NOTE — Discharge Instructions (Addendum)
Interventional radiology will replace your PICC this afternoon.  Expect a call from them to set up the appropriate time and appointment.

## 2021-08-02 NOTE — Progress Notes (Signed)
I was informed by Dr Serafina Royals that this pt needs a PICC line exchange due to occlusion. I called the pt at [phone] and asked if she is able to come to [facility] Radiology at 145pm today. She is agreeable. Directions given and understood by pt.  Please call this Charge nurse at [phone] if there are issues today.

## 2021-08-07 LAB — CULTURE, BLOOD (SINGLE)
Culture: NO GROWTH
Culture: NO GROWTH
Special Requests: ADEQUATE
Special Requests: ADEQUATE

## 2021-08-11 ENCOUNTER — Ambulatory Visit (HOSPITAL_BASED_OUTPATIENT_CLINIC_OR_DEPARTMENT_OTHER): Payer: Self-pay | Admitting: Cardiology

## 2021-08-18 ENCOUNTER — Telehealth: Payer: 59 | Admitting: Physician Assistant

## 2021-08-18 ENCOUNTER — Encounter (HOSPITAL_BASED_OUTPATIENT_CLINIC_OR_DEPARTMENT_OTHER): Payer: Self-pay

## 2021-08-18 DIAGNOSIS — M549 Dorsalgia, unspecified: Secondary | ICD-10-CM | POA: Diagnosis not present

## 2021-08-18 MED ORDER — NAPROXEN 500 MG PO TABS: 500.0000 mg | ORAL_TABLET | Freq: Two times a day (BID) | ORAL | 0 refills | Status: DC

## 2021-08-18 MED ORDER — CYCLOBENZAPRINE HCL 10 MG PO TABS: 10.0000 mg | ORAL_TABLET | Freq: Three times a day (TID) | ORAL | 0 refills | Status: DC | PRN

## 2021-08-18 NOTE — Progress Notes (Signed)
I have spent 5 minutes in review of e-visit questionnaire, review and updating patient chart, medical decision making and response to patient.   Nylani Michetti Cody Adriann Thau, PA-C    

## 2021-08-18 NOTE — Progress Notes (Signed)

## 2021-08-22 ENCOUNTER — Encounter (HOSPITAL_COMMUNITY): Payer: Self-pay | Admitting: Cardiology

## 2021-08-31 ENCOUNTER — Ambulatory Visit (HOSPITAL_BASED_OUTPATIENT_CLINIC_OR_DEPARTMENT_OTHER): Payer: 59

## 2021-08-31 ENCOUNTER — Ambulatory Visit (HOSPITAL_COMMUNITY): Payer: 59 | Admitting: Certified Registered"

## 2021-08-31 ENCOUNTER — Encounter (HOSPITAL_COMMUNITY): Payer: Self-pay | Admitting: Cardiology

## 2021-08-31 ENCOUNTER — Other Ambulatory Visit (HOSPITAL_COMMUNITY): Payer: Self-pay | Admitting: Cardiology

## 2021-08-31 ENCOUNTER — Encounter (HOSPITAL_COMMUNITY)
Admission: RE | Disposition: A | Discharge status: Home / Self Care | Payer: 59 | Source: Home / Self Care | Attending: Cardiology

## 2021-08-31 ENCOUNTER — Ambulatory Visit (HOSPITAL_COMMUNITY)
Admission: RE | Admit: 2021-08-31 | Discharge: 2021-08-31 | Disposition: A | Discharge status: Home / Self Care | Payer: 59 | Attending: Cardiology | Admitting: Cardiology

## 2021-08-31 DIAGNOSIS — Q2112 Patent foramen ovale: Secondary | ICD-10-CM | POA: Insufficient documentation

## 2021-08-31 DIAGNOSIS — I38 Endocarditis, valve unspecified: Secondary | ICD-10-CM | POA: Insufficient documentation

## 2021-08-31 DIAGNOSIS — I10 Essential (primary) hypertension: Secondary | ICD-10-CM

## 2021-08-31 DIAGNOSIS — F32A Depression, unspecified: Secondary | ICD-10-CM | POA: Insufficient documentation

## 2021-08-31 HISTORY — PX: BUBBLE STUDY: SHX6837

## 2021-08-31 HISTORY — PX: TEE WITHOUT CARDIOVERSION: SHX5443

## 2021-08-31 SURGERY — ECHOCARDIOGRAM, TRANSESOPHAGEAL
Anesthesia: General

## 2021-08-31 MED ORDER — PHENYLEPHRINE 40 MCG/ML (10ML) SYRINGE FOR IV PUSH (FOR BLOOD PRESSURE SUPPORT)
PREFILLED_SYRINGE | INTRAVENOUS | Status: DC | PRN
  Administered 2021-08-31: 80 ug via INTRAVENOUS

## 2021-08-31 MED ORDER — PROPOFOL 10 MG/ML IV BOLUS
INTRAVENOUS | Status: DC | PRN
  Administered 2021-08-31 (×4): 25 mg via INTRAVENOUS

## 2021-08-31 MED ORDER — LIDOCAINE 2% (20 MG/ML) 5 ML SYRINGE
INTRAMUSCULAR | Status: DC | PRN
  Administered 2021-08-31: 100 mg via INTRAVENOUS

## 2021-08-31 MED ORDER — PROPOFOL 500 MG/50ML IV EMUL
INTRAVENOUS | Status: DC | PRN
  Administered 2021-08-31 (×2): 100 ug/kg/min via INTRAVENOUS

## 2021-08-31 MED ORDER — SODIUM CHLORIDE 0.9 % IV SOLN: INTRAVENOUS | Status: DC

## 2021-08-31 MED ORDER — FENTANYL CITRATE (PF) 100 MCG/2ML IJ SOLN
INTRAMUSCULAR | Status: DC | PRN
  Administered 2021-08-31: 25 ug via INTRAVENOUS

## 2021-08-31 NOTE — Anesthesia Postprocedure Evaluation (Signed)
Anesthesia Post Note  Patient: Mary Chambers  Procedure(s) Performed: TRANSESOPHAGEAL ECHOCARDIOGRAM (TEE) BUBBLE STUDY     Patient location during evaluation: PACU Anesthesia Type: MAC Level of consciousness: awake Pain management: pain level controlled Vital Signs Assessment: post-procedure vital signs reviewed and stable Respiratory status: spontaneous breathing Cardiovascular status: stable Postop Assessment: no apparent nausea or vomiting Anesthetic complications: no   No notable events documented.  Last Vitals:  Vitals:   08/31/21 0934 08/31/21 0949  BP: (!) 100/41 117/61  Pulse: 93 84  Resp: 14 17  Temp: 36.7 C   SpO2: 100% 100%    Last Pain:  Vitals:   08/31/21 0949  TempSrc:   PainSc: 0-No pain                 Huston Foley

## 2021-08-31 NOTE — Transfer of Care (Signed)
Immediate Anesthesia Transfer of Care Note  Patient: Mary Chambers  Procedure(s) Performed: TRANSESOPHAGEAL ECHOCARDIOGRAM (TEE) BUBBLE STUDY  Patient Location: PACU  Anesthesia Type:MAC  Level of Consciousness: awake, alert  and oriented  Airway & Oxygen Therapy: Patient Spontanous Breathing and Patient connected to nasal cannula oxygen  Post-op Assessment: Report given to RN and Post -op Vital signs reviewed and stable  Post vital signs: Reviewed and stable  Last Vitals:  Vitals Value Taken Time  BP 100/41 08/31/21 0934  Temp    Pulse 87 08/31/21 0938  Resp 11 08/31/21 0938  SpO2 100 % 08/31/21 0938  Vitals shown include unvalidated device data.  Last Pain:  Vitals:   08/31/21 0830  TempSrc: Temporal  PainSc: 0-No pain         Complications: No notable events documented.

## 2021-08-31 NOTE — Anesthesia Preprocedure Evaluation (Addendum)
Anesthesia Evaluation  Patient identified by MRN, date of birth, ID band Patient awake    Reviewed: Allergy & Precautions, NPO status , Patient's Chart, lab work & pertinent test results  Airway Mallampati: II  TM Distance: >3 FB Neck ROM: Full   Comment: Grade I views previously with MAC 4 blade  Dental no notable dental hx.    Pulmonary neg pulmonary ROS,    Pulmonary exam normal breath sounds clear to auscultation       Cardiovascular hypertension, Pt. on medications Normal cardiovascular exam Rhythm:Regular Rate:Normal  Echo 06/26/2021 1. Poor quality study bad acoustic windows limited.  2. Left ventricular ejection fraction, by estimation, is 55 to 60%. The left ventricle has normal function. Left ventricular diastolic parameters are indeterminate.  3. The mitral valve was not well visualized. No evidence of mitral valve regurgitation.  4. AV not well seen Some calcification and ? mild to moderate AR Cannot r/o vegetation Suggest TEE if clinically indicated. The aortic valve was not well visualized.  5. The inferior vena cava is normal in size with greater than 50% respiratory variability, suggesting right atrial pressure of 3 mmHg   Neuro/Psych  Headaches, PSYCHIATRIC DISORDERS Anxiety Depression    GI/Hepatic Neg liver ROS, GERD  ,  Endo/Other  Morbid obesity  Renal/GU Renal disease     Musculoskeletal  (+) Arthritis ,   Abdominal (+) + obese,   Peds  Hematology negative hematology ROS (+)   Anesthesia Other Findings   Reproductive/Obstetrics negative OB ROS                            Anesthesia Physical  Anesthesia Plan  ASA: 3  Anesthesia Plan: General   Post-op Pain Management:    Induction: Intravenous  PONV Risk Score and Plan: 2 and Ondansetron, Treatment may vary due to age or medical condition and Propofol infusion  Airway Management Planned: Natural Airway and  Mask  Additional Equipment: None  Intra-op Plan:   Post-operative Plan:   Informed Consent: I have reviewed the patients History and Physical, chart, labs and discussed the procedure including the risks, benefits and alternatives for the proposed anesthesia with the patient or authorized representative who has indicated his/her understanding and acceptance.       Plan Discussed with: CRNA  Anesthesia Plan Comments:         Anesthesia Quick Evaluation

## 2021-08-31 NOTE — H&P (Signed)
Cardiology Admission History and Physical:   Patient ID: Mary Chambers MRN: [id number]; DOB: 24-Jul-1971   Admission date: 08/31/2021  PCP:  [name], NP   [facility] Providers Cardiologist:  [name], MD   {    Chief Complaint:  Aortic Valve Endocarditis  Patient Profile:   Mary Chambers is a 50 y.o. female with HTN, obesity, arthritis and depression who was recently admitted with paraspinal abscesses found to have severe AR with concern for AoV endocarditis who is being seen 08/31/2021 for TEE for further work-up of severe AR.  History of Present Illness:   [name] is a 50 year old female with history as detailed above who presents for planned TEE for further work-up of severe AR suspected to be secondary to infective endocarditis.   Past Medical History:  Diagnosis Date   Arthritis    Depression    Hypertension    Kidney stone    Obesity     Past Surgical History:  Procedure Laterality Date   ABDOMINAL HYSTERECTOMY     BACK SURGERY     CARPAL TUNNEL RELEASE     CHOLECYSTECTOMY     IR FLUORO GUIDED NEEDLE PLC ASPIRATION/INJECTION LOC  06/23/2021   LITHOTRIPSY     neck fusion     TEE WITHOUT CARDIOVERSION N/A 06/28/2021   Procedure: TRANSESOPHAGEAL ECHOCARDIOGRAM (TEE);  Surgeon: Lelon Perla, MD;  Location: [facility]COPY;  Service: Cardiovascular;  Laterality: N/A;   TUBAL LIGATION       Medications Prior to Admission: Prior to Admission medications   Medication Sig Start Date End Date Taking? Authorizing Provider  Ascorbic Acid (VITAMIN C) 500 MG CAPS Take 500 mg by mouth daily.   Yes [provider]  atorvastatin (LIPITOR) 10 MG tablet Take 10 mg by mouth daily. 04/27/21  Yes [provider]  cholecalciferol (VITAMIN D3) 25 MCG (1000 UT) tablet Take 1,000 Units by mouth daily.   Yes [provider]  enalapril (VASOTEC) 10 MG tablet Take 10 mg by mouth daily.   Yes  [provider]  fluticasone (FLONASE) 50 MCG/ACT nasal spray Place 2 sprays into both nostrils daily. Patient taking differently: Place 2 sprays into both nostrils daily as needed for allergies. 05/03/21  Yes Brunetta Jeans, PA-C  furosemide (LASIX) 20 MG tablet Take 20 mg by mouth daily. 05/31/21  Yes [provider]  hydrochlorothiazide (HYDRODIURIL) 25 MG tablet Take 25 mg by mouth daily.   Yes [provider]  loperamide (IMODIUM) 2 MG capsule Take 1 capsule (2 mg total) by mouth as needed for diarrhea or loose stools. 07/01/21  Yes Nita Sells, MD  Multiple Vitamin (MULTIVITAMIN) capsule Take 1 capsule by mouth daily.    Yes [provider]  naproxen (NAPROSYN) 500 MG tablet Take 1 tablet (500 mg total) by mouth 2 (two) times daily with a meal. 08/18/21  Yes Brunetta Jeans, PA-C  pantoprazole (PROTONIX) 40 MG tablet Take 40 mg by mouth daily. 04/22/21  Yes [provider]  Potassium (POTASSIMIN PO) Take 500 mg by mouth daily. 250 per tab   Yes [provider]  tiZANidine (ZANAFLEX) 4 MG tablet Take 4 mg by mouth at bedtime.   Yes [provider]  cyclobenzaprine (FLEXERIL) 10 MG tablet Take 1 tablet (10 mg total) by mouth 3 (three) times daily as needed for muscle spasms. Patient not taking: Reported on 08/23/2021 08/18/21   Brunetta Jeans, PA-C  diphenhydrAMINE (BENADRYL) 25  mg capsule Take by mouth. 03/10/16 06/21/19  [provider]  LYRICA 50 MG capsule Take 50 mg by mouth 3 (three) times daily. 04/15/18 06/07/19  [provider]     Allergies:    Allergies  Allergen Reactions   Baclofen Hives   Doxycycline Itching   Percocet [Oxycodone-Acetaminophen] Hives   Septra [Sulfamethoxazole-Trimethoprim] Hives   Gabapentin Rash    Social History:   Social History   Socioeconomic History   Marital status: Single    Spouse name: Not on file   Number of children: Not on file   Years of education:  Not on file   Highest education level: Not on file  Occupational History   Not on file  Tobacco Use   Smoking status: Never   Smokeless tobacco: Never  Vaping Use   Vaping Use: Never used  Substance and Sexual Activity   Alcohol use: No   Drug use: Never   Sexual activity: Not Currently    Birth control/protection: None  Other Topics Concern   Not on file  Social History Narrative   Not on file   Social Determinants of Health   Financial Resource Strain: Not on file  Food Insecurity: Not on file  Transportation Needs: Not on file  Physical Activity: Not on file  Stress: Not on file  Social Connections: Not on file  Intimate Partner Violence: Not on file    Family History:   The patient's family history includes Cancer in her father; Diabetes in her mother; Heart disease in her brother, father, and sister; Hypertension in her mother; Parkinson's disease in her father. There is no history of Colon cancer, Rectal cancer, or Stomach cancer.    ROS:  Please see the history of present illness.  All other ROS reviewed and negative.     Physical Exam/Data:   Vitals:   08/23/21 1139  Weight: 113.4 kg  Height: 5\' 7"  (1.702 m)   No intake or output data in the 24 hours ending 08/31/21 0832 Last 3 Weights 08/23/2021 08/02/2021 07/26/2021  Weight (lbs) 250 lb 260 lb 262 lb  Weight (kg) 113.399 kg 117.935 kg 118.842 kg     Body mass index is 39.16 kg/m.  General:  Well nourished, well developed, in no acute distress HEENT: normal Neck: no JVD Vascular: No carotid bruits; Distal pulses 2+ bilaterally   Cardiac:  RR, 2/6 diastolic murmur Lungs:  clear to auscultation bilaterally, no wheezing, rhonchi or rales  Abd: soft, nontender, no hepatomegaly  Ext: no edema Musculoskeletal:  No deformities, BUE and BLE strength normal and equal Skin: warm and dry  Neuro:  CNs 2-12 intact, no focal abnormalities noted Psych:  Normal affect    EKG:  NSR with HR 98 on  07/26/21  Relevant CV Studies: CTA Chest 07/15/2021: COMPARISON:  Chest radiograph 07/14/2021 CT chest 04/22/2020   FINDINGS: Cardiovascular: Good opacification of the central and segmental pulmonary arteries. No focal filling defects. No evidence of significant pulmonary embolus. Normal heart size. No pericardial effusions. Ascending aortic aneurysm measuring 4.1 cm diameter. No aortic dissection. Great vessel origins are patent.   Mediastinum/Nodes: Thyroid gland is unremarkable. Esophagus is decompressed. No significant lymphadenopathy.   Lungs/Pleura: Mild dependent changes in the lung bases. No airspace disease or consolidation. No pleural effusions. No pneumothorax.   Upper Abdomen: Surgical absence of the gallbladder. No acute abnormalities demonstrated in the upper abdomen.   Musculoskeletal: Postoperative changes in the cervical spine and lower thoracic spine. Degenerative changes. No destructive  bone lesions.   Review of the MIP images confirms the above findings.   IMPRESSION: 1. No evidence of significant pulmonary embolus. 2. No evidence of active pulmonary disease. 3. 4.1 cm diameter ascending aortic aneurysm. Recommend annual imaging followup by CTA or MRA. This recommendation follows 2010 ACCF/AHA/AATS/ACR/ASA/SCA/SCAI/SIR/STS/SVM Guidelines for the Diagnosis and Management of Patients with Thoracic Aortic Disease. Circulation. 2010; 121: X106-Y694. Aortic aneurysm NOS (ICD10-I71.9)   Echo TEE 06/28/2021:  1. Bicuspid aortic valve (left and right cusps appear to be fused);  probable vegetation noted; severe AI.   2. Left ventricular ejection fraction, by estimation, is 60 to 65%. The  left ventricle has normal function. The left ventricle has no regional  wall motion abnormalities.   3. Right ventricular systolic function is normal. The right ventricular  size is normal.   4. No left atrial/left atrial appendage thrombus was detected.   5. The mitral valve  is normal in structure. No evidence of mitral valve  regurgitation.   6. The aortic valve is bicuspid. Aortic valve regurgitation is severe.   Echo 06/26/2021 1. Poor quality study bad acoustic windows limited.   2. Left ventricular ejection fraction, by estimation, is 55 to 60%. The  left ventricle has normal function. Left ventricular diastolic parameters  are indeterminate.   3. The mitral valve was not well visualized. No evidence of mitral valve  regurgitation.   4. AV not well seen Some calcification and ? mild to moderate AR Cannot  r/o vegetation Suggest TEE if clinically indicated . The aortic valve was  not well visualized.   5. The inferior vena cava is normal in size with greater than 50%  respiratory variability, suggesting right atrial pressure of 3 mmHg.     Laboratory Data:  High Sensitivity Troponin:  No results for input(s): TROPONINIHS in the last 720 hours.    ChemistryNo results for input(s): NA, K, CL, CO2, GLUCOSE, BUN, CREATININE, CALCIUM, MG, GFRNONAA, GFRAA, ANIONGAP in the last 168 hours.  No results for input(s): PROT, ALBUMIN, AST, ALT, ALKPHOS, BILITOT in the last 168 hours. Lipids No results for input(s): CHOL, TRIG, HDL, LABVLDL, LDLCALC, CHOLHDL in the last 168 hours. HematologyNo results for input(s): WBC, RBC, HGB, HCT, MCV, MCH, MCHC, RDW, PLT in the last 168 hours. Thyroid No results for input(s): TSH, FREET4 in the last 168 hours. BNPNo results for input(s): BNP, PROBNP in the last 168 hours.  DDimer No results for input(s): DDIMER in the last 168 hours.   Radiology/Studies:  No results found.   Assessment and Plan:   AoV Endocarditis: Severe AR: -Plan for TEE today for further work-up of suspected aortic valve endocarditis with severe AR   Risk Assessment/Risk Scores:         For questions or updates, please contact [place] Please consult [email or web address] for contact info under     Signed, Freada Bergeron, MD   08/31/2021 8:32 AM

## 2021-08-31 NOTE — Anesthesia Procedure Notes (Signed)
Procedure Name: MAC Date/Time: 08/31/2021 9:18 AM Performed by: Imagene Riches, CRNA Pre-anesthesia Checklist: Patient identified, Emergency Drugs available, Suction available, Patient being monitored and Timeout performed Patient Re-evaluated:Patient Re-evaluated prior to induction Oxygen Delivery Method: Nasal cannula

## 2021-08-31 NOTE — Procedures (Signed)
     Transesophageal Echocardiogram Note  Zeyna Mkrtchyan [id number] 09/22/1971  Procedure: Transesophageal Echocardiogram Indications: Aortic valve endocarditis  Procedure Details Consent: Obtained Time Out: Verified patient identification, verified procedure, site/side was marked, verified correct patient position, special equipment/implants available, Radiology Safety Procedures followed,  medications/allergies/relevent history reviewed, required imaging and test results available.  Performed  Medications: Propofol 440mg  Lidocaine 100mg  Fentanyl 34mcg  Left Ventrical:  LVEF 60-65%  Mitral Valve: Trivial MR. No vegetations visualized  Aortic Valve: The aortic valve is bicuspid with fusion of the RCC and LCC. There is flail of one of the leaflets (?NCC) with resultant severe, highly eccentric aortic regurgitation. The valve leaflet tips are also asymmetrically thickened (most notably on NCC) concerning for vegetation/endocarditis  Tricuspid Valve: Trivial TR; no vegetations visualized  Pulmonic Valve: Trivial PI; no vegetations visualized  Left Atrium/ Left atrial appendage: No LAA thrombus  Atrial septum: Aneurysmal. Suspect PFO by color doppler. Weakly positive bubble study  Aorta: Grade II plaquing   Complications: No apparent complications Patient did tolerate procedure well.  Freada Bergeron, MD 08/31/2021, 9:28 AM

## 2021-08-31 NOTE — Progress Notes (Signed)
  Echocardiogram Echocardiogram Transesophageal has been performed.  Fidel Levy 08/31/2021, 9:51 AM

## 2021-09-01 ENCOUNTER — Encounter (HOSPITAL_BASED_OUTPATIENT_CLINIC_OR_DEPARTMENT_OTHER): Payer: Self-pay

## 2021-09-01 NOTE — Telephone Encounter (Signed)
Please advise 

## 2021-09-02 ENCOUNTER — Telehealth: Payer: 59 | Admitting: Nurse Practitioner

## 2021-09-02 ENCOUNTER — Encounter (HOSPITAL_COMMUNITY): Payer: Self-pay | Admitting: Cardiology

## 2021-09-02 DIAGNOSIS — J392 Other diseases of pharynx: Secondary | ICD-10-CM

## 2021-09-02 NOTE — Progress Notes (Signed)
E-Visit for Sore Throat  We are sorry that you are not feeling well.  Here is how we plan to help!  Your sore throat may be coming from your transesophageal echo that you had done several days ago.  Your symptoms indicate a likely viral infection (Pharyngitis).   Pharyngitis is inflammation in the back of the throat which can cause a sore throat, scratchiness and sometimes difficulty swallowing.   Pharyngitis is typically caused by a respiratory virus and will just run its course.  Please keep in mind that your symptoms could last up to 10 days.  For throat pain, we recommend over the counter oral pain relief medications such as acetaminophen or aspirin, or anti-inflammatory medications such as ibuprofen or naproxen sodium.  Topical treatments such as oral throat lozenges or sprays may be used as needed.  Avoid close contact with loved ones, especially the very young and elderly.  Remember to wash your hands thoroughly throughout the day as this is the number one way to prevent the spread of infection and wipe down door knobs and counters with disinfectant.  After careful review of your answers, I would not recommend and antibiotic for your condition.  Antibiotics should not be used to treat conditions that we suspect are caused by viruses like the virus that causes the common cold or flu. However, some people can have Strep with atypical symptoms. You may need formal testing in clinic or office to confirm if your symptoms continue or worsen.  Providers prescribe antibiotics to treat infections caused by bacteria. Antibiotics are very powerful in treating bacterial infections when they are used properly.  To maintain their effectiveness, they should be used only when necessary.  Overuse of antibiotics has resulted in the development of super bugs that are resistant to treatment!    Home Care: Only take medications as instructed by your medical team. Do not drink alcohol while taking these  medications. A steam or ultrasonic humidifier can help congestion.  You can place a towel over your head and breathe in the steam from hot water coming from a faucet. Avoid close contacts especially the very young and the elderly. Cover your mouth when you cough or sneeze. Always remember to wash your hands.  Get Help Right Away If: You develop worsening fever or throat pain. You develop a severe head ache or visual changes. Your symptoms persist after you have completed your treatment plan.  Make sure you Understand these instructions. Will watch your condition. Will get help right away if you are not doing well or get worse.   Thank you for choosing an e-visit.  Your e-visit answers were reviewed by a board certified advanced clinical practitioner to complete your personal care plan. Depending upon the condition, your plan could have included both over the counter or prescription medications.  Please review your pharmacy choice. Make sure the pharmacy is open so you can pick up prescription now. If there is a problem, you may contact your provider through [employer] and have the prescription routed to another pharmacy.  Your safety is important to [place]. If you have drug allergies check your prescription carefully.   For the next 24 hours you can use MyChart to ask questions about today's visit, request a non-urgent call back, or ask for a work or school excuse. You will get an email in the next two days asking about your experience. I hope that your e-visit has been valuable and will speed your recovery.  5-10 minutes  spent reviewing and documenting in chart.

## 2021-09-04 ENCOUNTER — Telehealth: Payer: 59 | Admitting: Family

## 2021-09-04 DIAGNOSIS — J069 Acute upper respiratory infection, unspecified: Secondary | ICD-10-CM

## 2021-09-04 MED ORDER — PREDNISONE 10 MG (21) PO TBPK: ORAL_TABLET | ORAL | 0 refills | Status: DC

## 2021-09-04 MED ORDER — BENZONATATE 100 MG PO CAPS: 100.0000 mg | ORAL_CAPSULE | Freq: Three times a day (TID) | ORAL | 0 refills | Status: DC | PRN

## 2021-09-04 MED ORDER — FLUTICASONE PROPIONATE 50 MCG/ACT NA SUSP: 2.0000 | Freq: Every day | NASAL | 6 refills | Status: DC

## 2021-09-04 NOTE — Progress Notes (Signed)

## 2021-09-07 ENCOUNTER — Telehealth: Payer: Self-pay | Admitting: Cardiology

## 2021-09-07 NOTE — Telephone Encounter (Signed)
Patient states she had a recent procedure and wants to discuss the next step.

## 2021-09-11 ENCOUNTER — Telehealth: Payer: 59 | Admitting: Physician Assistant

## 2021-09-11 DIAGNOSIS — J019 Acute sinusitis, unspecified: Secondary | ICD-10-CM

## 2021-09-12 MED ORDER — AMOXICILLIN-POT CLAVULANATE 875-125 MG PO TABS: 1.0000 | ORAL_TABLET | Freq: Two times a day (BID) | ORAL | 0 refills | Status: AC

## 2021-09-12 NOTE — Progress Notes (Signed)

## 2021-09-20 ENCOUNTER — Ambulatory Visit (INDEPENDENT_AMBULATORY_CARE_PROVIDER_SITE_OTHER): Payer: 59 | Admitting: Cardiology

## 2021-09-20 ENCOUNTER — Other Ambulatory Visit: Payer: Self-pay

## 2021-09-20 VITALS — BP 135/79 | HR 90 | Ht 67.0 in | Wt 265.6 lb

## 2021-09-20 DIAGNOSIS — I358 Other nonrheumatic aortic valve disorders: Secondary | ICD-10-CM | POA: Diagnosis not present

## 2021-09-20 DIAGNOSIS — Z01818 Encounter for other preprocedural examination: Secondary | ICD-10-CM

## 2021-09-20 DIAGNOSIS — I351 Nonrheumatic aortic (valve) insufficiency: Secondary | ICD-10-CM | POA: Diagnosis not present

## 2021-09-20 DIAGNOSIS — Q231 Congenital insufficiency of aortic valve: Secondary | ICD-10-CM

## 2021-09-20 DIAGNOSIS — I10 Essential (primary) hypertension: Secondary | ICD-10-CM

## 2021-09-20 DIAGNOSIS — Z01812 Encounter for preprocedural laboratory examination: Secondary | ICD-10-CM | POA: Diagnosis not present

## 2021-09-20 NOTE — Patient Instructions (Signed)
Medication Instructions:  Your Physician recommend you continue on your current medication as directed.    *If you need a refill on your cardiac medications before your next appointment, please call your pharmacy*   Lab Work: Your provider has recommended lab work today (CBC, BMP). Please have this collected at [facility] [place]. The lab is open 8:00 am - 4:30 pm. Please avoid 12:00p - 1:00p for lunch hour. You do not need an appointment. Please go to [address] [place], [place] [address]. This is in the Primary Care office on the 3rd floor, let them know you are there for blood work and they will direct you to the lab.  If you have labs (blood work) drawn today and your tests are completely normal, you will receive your results only by: [place] (if you have MyChart) OR A paper copy in the mail If you have any lab test that is abnormal or we need to change your treatment, we will call you to review the results.   Testing/Procedures: Your physician has requested that you have a cardiac catheterization. Cardiac catheterization is used to diagnose and/or treat various heart conditions. Doctors may recommend this procedure for a number of different reasons. The most common reason is to evaluate chest pain. Chest pain can be a symptom of coronary artery disease (CAD), and cardiac catheterization can show whether plaque is narrowing or blocking your hearts arteries. This procedure is also used to evaluate the valves, as well as measure the blood flow and oxygen levels in different parts of your heart. For further information please visit [email or web address]. Please follow instruction sheet, as given. [facility]   Follow-Up: At [facility], you and your health needs are our priority.  As part of our continuing mission to provide you with exceptional heart care, we have created designated Provider Care Teams.  These Care Teams include your primary  Cardiologist (physician) and Advanced Practice Providers (APPs -  Physician Assistants and Nurse Practitioners) who all work together to provide you with the care you need, when you need it.  We recommend signing up for the patient portal called "MyChart".  Sign up information is provided on this After Visit Summary.  MyChart is used to connect with patients for Virtual Visits (Telemedicine).  Patients are able to view lab/test results, encounter notes, upcoming appointments, etc.  Non-urgent messages can be sent to your provider as well.   To learn more about what you can do with MyChart, go to [email or web address].    Your next appointment:   3 month(s)  The format for your next appointment:   In Person  Provider:   [name], MD    [place] [address] Dept: [place]  09/20/2021  You are scheduled for a Cardiac Catheterization on Friday, January 6 with Dr. Larae Grooms.  1. Please arrive at the [facility] (Main Entrance A) at [facility]: [address] [place], [place] [address] at 5:30 AM (This time is two hours before your procedure to ensure your preparation). Free valet parking service is available.   Special note: Every effort is made to have your procedure done on time. Please understand that emergencies sometimes delay scheduled procedures.  2. Diet: Do not eat solid foods after midnight.  The patient may have clear liquids until 5am upon the day of the procedure.  3. Labs: You will  need to have blood drawn today (CBC, BMP).  4. Medication instructions in preparation for your procedure:   Contrast Allergy: No  Hold Lasix and Hydrochlorothiazide morning of procedure  On the morning of your procedure, take your Aspirin and any morning medicines NOT listed above.  You may use sips of water.  5. Plan for  one night stay--bring personal belongings. 6. Bring a current list of your medications and current insurance cards. 7. You MUST have a responsible person to drive you home. 8. Someone MUST be with you the first 24 hours after you arrive home or your discharge will be delayed. 9. Please wear clothes that are easy to get on and off and wear slip-on shoes.  Thank you for allowing [place] to care for you!   -- [facility]

## 2021-09-20 NOTE — Progress Notes (Signed)
Cardiology Office Note:    Date:  09/20/2021   ID:  Mary Chambers, DOB [birth year], MRN [id number]  PCP:  [name], NP  Cardiologist:  [name], MD  Referring MD: [name], NP   CC: follow up   History of Present Illness:    Mary Chambers is a 50 y.o. female with a hx of hypertension, obesity, arthritis, and depression, who is seen for follow-up.    She presented to the ED 07/14/2021 with acute chest pain, evaluation unremarkable at that time. I met her during her hospitalization 06/2021 for paraspinal abscesses and endocarditis, with incidental Covid 19 infection at the time.  Today: She is not doing well and is tearful. She has been more fatigued recently and is worried about the next steps.  She has chest pain caused by heartburn. She reports anything, such as water, will cause acid reflux. She takes prescription and OTC medicine for the acid reflux. These medications help. However, the effects are not long lasting.   She endorses LLE edema. She notices the swelling is worse in the morning and evening. She takes her fluid pill but feels the medication works variably. She has not weighed herself in the past few weeks.  She denies shortness of breath at rest or with normal exertion, PND, orthopnea, syncope or palpitations.  Past Medical History:  Diagnosis Date   Arthritis    Depression    Hypertension    Kidney stone    Obesity     Past Surgical History:  Procedure Laterality Date   ABDOMINAL HYSTERECTOMY     BACK SURGERY     BUBBLE STUDY  08/31/2021   Procedure: BUBBLE STUDY;  Surgeon: Freada Bergeron, MD;  Location: [place];  Service: Cardiovascular;;   CARPAL TUNNEL RELEASE     CHOLECYSTECTOMY     IR FLUORO GUIDED NEEDLE PLC ASPIRATION/INJECTION LOC  06/23/2021   LITHOTRIPSY     neck fusion     TEE WITHOUT CARDIOVERSION N/A 06/28/2021   Procedure: TRANSESOPHAGEAL ECHOCARDIOGRAM (TEE);  Surgeon:  Lelon Perla, MD;  Location: [facility] ENDOSCOPY;  Service: Cardiovascular;  Laterality: N/A;   TEE WITHOUT CARDIOVERSION N/A 08/31/2021   Procedure: TRANSESOPHAGEAL ECHOCARDIOGRAM (TEE);  Surgeon: Freada Bergeron, MD;  Location: [facility] ENDOSCOPY;  Service: Cardiovascular;  Laterality: N/A;   TUBAL LIGATION      Current Medications: Current Outpatient Medications on File Prior to Visit  Medication Sig   Ascorbic Acid (VITAMIN C) 500 MG CAPS Take 500 mg by mouth daily.   atorvastatin (LIPITOR) 10 MG tablet Take 10 mg by mouth daily.   cholecalciferol (VITAMIN D3) 25 MCG (1000 UT) tablet Take 1,000 Units by mouth daily.   enalapril (VASOTEC) 10 MG tablet Take 10 mg by mouth daily.   fluticasone (FLONASE) 50 MCG/ACT nasal spray Place 2 sprays into both nostrils daily.   furosemide (LASIX) 20 MG tablet Take 20 mg by mouth daily.   hydrochlorothiazide (HYDRODIURIL) 25 MG tablet Take 25 mg by mouth daily.   loperamide (IMODIUM) 2 MG capsule Take 1 capsule (2 mg total) by mouth as needed for diarrhea or loose stools.   Multiple Vitamin (MULTIVITAMIN) capsule Take 1 capsule by mouth daily.    naproxen (NAPROSYN) 500 MG tablet Take 1 tablet (500 mg total) by mouth 2 (two) times daily with a meal.   pantoprazole (PROTONIX) 40 MG tablet Take 40 mg by mouth daily.   Potassium (POTASSIMIN PO) Take 500 mg by mouth daily. 250 per  tab   tiZANidine (ZANAFLEX) 4 MG tablet Take 4 mg by mouth at bedtime.   traMADol (ULTRAM) 50 MG tablet Take 50 mg by mouth as needed.   No current facility-administered medications on file prior to visit.     Allergies:   Baclofen, Doxycycline, Percocet [oxycodone-acetaminophen], Septra [sulfamethoxazole-trimethoprim], and Gabapentin   Social History   Tobacco Use   Smoking status: Never   Smokeless tobacco: Never  Vaping Use   Vaping Use: Never used  Substance Use Topics   Alcohol use: No   Drug use: Never    Family History: family history includes Cancer in her  father; Diabetes in her mother; Heart disease in her brother, father, and sister; Hypertension in her mother; Parkinson's disease in her father. There is no history of Colon cancer, Rectal cancer, or Stomach cancer.  ROS:   Please see the history of present illness. (+) Tearful (+) Fatigue (+) Acid reflux (+) Chest pain (+) LLE edema All other systems are reviewed and negative.   EKGs/Labs/Other Studies Reviewed:    The following studies were reviewed today: Echo TEE 08/31/21 1. The aortic valve is bicuspid with fusion of the RCC and LCC. The  leaflets are asymmetrically thickened most notably at the [facility] leaflet tip  with concern for vegetation/infective endocarditis. There is flail of one  of the leaflets (suspect NCC) with  lack of central coaptation and resultant severe, highly eccentric aortic  regurgitation.   2. Left ventricular ejection fraction, by estimation, is 65 to 70%. The  left ventricle has normal function.   3. Right ventricular systolic function is normal. The right ventricular  size is normal.   4. No left atrial/left atrial appendage thrombus was detected.   5. The mitral valve is normal in structure. Trivial mitral valve  regurgitation.   6. Aortic dilatation noted. There is borderline dilatation of the aortic  root, measuring 36 mm. There is mild-to-moderate dilatation of the  ascending aorta, measuring 43 mm.   7. The interatrial septum is aneurysmal. There is a PFO demonstrated by  color doppler. Weakly positive agitated saline bubble study.  Comparison(s): Compared to prior TEE on 06/28/21, there continues to be  severe AR with concern for infective endocarditis.   CTA Chest 07/15/2021: COMPARISON:  Chest radiograph 07/14/2021 CT chest 04/22/2020   FINDINGS: Cardiovascular: Good opacification of the central and segmental pulmonary arteries. No focal filling defects. No evidence of significant pulmonary embolus. Normal heart size. No  pericardial effusions. Ascending aortic aneurysm measuring 4.1 cm diameter. No aortic dissection. Great vessel origins are patent.   Mediastinum/Nodes: Thyroid gland is unremarkable. Esophagus is decompressed. No significant lymphadenopathy.   Lungs/Pleura: Mild dependent changes in the lung bases. No airspace disease or consolidation. No pleural effusions. No pneumothorax.   Upper Abdomen: Surgical absence of the gallbladder. No acute abnormalities demonstrated in the upper abdomen.   Musculoskeletal: Postoperative changes in the cervical spine and lower thoracic spine. Degenerative changes. No destructive bone lesions.   Review of the MIP images confirms the above findings.   IMPRESSION: 1. No evidence of significant pulmonary embolus. 2. No evidence of active pulmonary disease. 3. 4.1 cm diameter ascending aortic aneurysm. Recommend annual imaging followup by CTA or MRA. This recommendation follows 2010 ACCF/AHA/AATS/ACR/ASA/SCA/SCAI/SIR/STS/SVM Guidelines for the Diagnosis and Management of Patients with Thoracic Aortic Disease. Circulation. 2010; 121: Z308-M578. Aortic aneurysm NOS (ICD10-I71.9)  Echo TEE 06/28/2021:  1. Bicuspid aortic valve (left and right cusps appear to be fused);  probable vegetation noted; severe AI.  2. Left ventricular ejection fraction, by estimation, is 60 to 65%. The  left ventricle has normal function. The left ventricle has no regional  wall motion abnormalities.   3. Right ventricular systolic function is normal. The right ventricular  size is normal.   4. No left atrial/left atrial appendage thrombus was detected.   5. The mitral valve is normal in structure. No evidence of mitral valve  regurgitation.   6. The aortic valve is bicuspid. Aortic valve regurgitation is severe.  Echo 06/26/2021 1. Poor quality study bad acoustic windows limited.   2. Left ventricular ejection fraction, by estimation, is 55 to 60%. The  left ventricle has  normal function. Left ventricular diastolic parameters  are indeterminate.   3. The mitral valve was not well visualized. No evidence of mitral valve  regurgitation.   4. AV not well seen Some calcification and ? mild to moderate AR Cannot  r/o vegetation Suggest TEE if clinically indicated . The aortic valve was  not well visualized.   5. The inferior vena cava is normal in size with greater than 50%  respiratory variability, suggesting right atrial pressure of 3 mmHg.   EKG:  EKG is personally reviewed.   09/20/2021: NSR at 90 bpm 07/26/2021: NSR at 98 bpm  Recent Labs: 06/29/2021: Magnesium 1.9 07/14/2021: B Natriuretic Peptide 89.5 08/02/2021: ALT 53; BUN 17; Creatinine, Ser 0.80; Hemoglobin 11.1; Platelets 285; Potassium 3.6; Sodium 142  Recent Lipid Panel No results found for: CHOL, TRIG, HDL, CHOLHDL, VLDL, LDLCALC, LDLDIRECT  Physical Exam:    VS:  BP 135/79 (BP Location: Left Arm, Patient Position: Sitting, Cuff Size: Large)    Pulse 90    Ht _0  (1.702 m)    Wt 265 lb 9.6 oz (120.5 kg)    BMI 41.60 kg/m     Wt Readings from Last 3 Encounters:  09/20/21 265 lb 9.6 oz (120.5 kg)  08/31/21 250 lb (113.4 kg)  08/02/21 260 lb (117.9 kg)    GEN: Well nourished, well developed in no acute distress HEENT: Normal, moist mucous membranes NECK: No JVD CARDIAC: regular rhythm, normal S1 and S2, no rubs or gallops. 2/6 systolic murmur and 2/4 diastolic murmur at RUSB VASCULAR: Radial and DP pulses 2+ bilaterally. No carotid bruits RESPIRATORY:  Clear to auscultation without rales, wheezing or rhonchi  ABDOMEN: Soft, non-tender, non-distended MUSCULOSKELETAL:  Ambulates independently SKIN: Warm and dry, no edema NEUROLOGIC:  Alert and oriented x 3. No focal neuro deficits noted. PSYCHIATRIC:  Normal affect    ASSESSMENT:    1. Severe aortic regurgitation   2. Pre-procedure lab exam   3. Pre-op evaluation   4. Aortic valve endocarditis   5. Bicuspid aortic valve   6.  Hypertension, benign   7. Morbidly obese (HCC)     PLAN:    Aortic valve endocarditis Severe aortic regurgitation Bicuspid aortic valve -completing antibiotic course next week -has significant dyspnea on exertion and fatigue. Suspect this is multifactorial, but severe aortic regurgitation likely contributing. -Reviewed her TEE results -we discussed cath as next step, she is amenable. Labs ordered today -will refer to surgery to be evaluated post cath.  Shared Decision Making/Informed Consent The risks [stroke (1 in 1000), death (1 in 1000), kidney failure [usually temporary] (1 in 500), bleeding (1 in 200), allergic reaction [possibly serious] (1 in 200)], benefits (diagnostic support and management of coronary artery disease) and alternatives of a cardiac catheterization were discussed in detail with Mary Chambers and she is  willing to proceed.  Hypertension: -near goal on current regimen, continue  Morbid obesity: BMI 41  Cardiac risk counseling and prevention recommendations: -recommend heart healthy/Mediterranean diet, with whole grains, fruits, vegetable, fish, lean meats, nuts, and olive oil. Limit salt. -recommend moderate walking, 3-5 times/week for 30-50 minutes each session. Aim for at least 150 minutes.week. Goal should be pace of 3 miles/hours, or walking 1.5 miles in 30 minutes -recommend avoidance of tobacco products. Avoid excess alcohol. -ASCVD risk score: The ASCVD Risk score (Arnett DK, et al., 2019) failed to calculate for the following reasons:   Cannot find a previous HDL lab   Cannot find a previous total cholesterol lab    Plan for follow up: 3 months, hopefully after surgery  [name], MD, PhD, [facility]    Medication Adjustments/Labs and Tests Ordered: Current medicines are reviewed at length with the patient today.  Concerns regarding medicines are outlined above.  Orders Placed This Encounter  Procedures    Basic metabolic panel   CBC   EKG 12-Lead    No orders of the defined types were placed in this encounter.    Patient Instructions  Medication Instructions:  Your Physician recommend you continue on your current medication as directed.    *If you need a refill on your cardiac medications before your next appointment, please call your pharmacy*   Lab Work: Your provider has recommended lab work today (CBC, BMP). Please have this collected at [facility]. The lab is open 8:00 am - 4:30 pm. Please avoid 12:00p - 1:00p for lunch hour. You do not need an appointment. Please go to [address] [place], [place] [address]. This is in the Primary Care office on the 3rd floor, let them know you are there for blood work and they will direct you to the lab.  If you have labs (blood work) drawn today and your tests are completely normal, you will receive your results only by: Ray (if you have MyChart) OR A paper copy in the mail If you have any lab test that is abnormal or we need to change your treatment, we will call you to review the results.   Testing/Procedures: Your physician has requested that you have a cardiac catheterization. Cardiac catheterization is used to diagnose and/or treat various heart conditions. Doctors may recommend this procedure for a number of different reasons. The most common reason is to evaluate chest pain. Chest pain can be a symptom of coronary artery disease (CAD), and cardiac catheterization can show whether plaque is narrowing or blocking your hearts arteries. This procedure is also used to evaluate the valves, as well as measure the blood flow and oxygen levels in different parts of your heart. For further information please visit [email or web address]. Please follow instruction sheet, as given. [facility]   Follow-Up: At [facility], you and your health needs are our priority.  As part of our  continuing mission to provide you with exceptional heart care, we have created designated Provider Care Teams.  These Care Teams include your primary Cardiologist (physician) and Advanced Practice Providers (APPs -  Physician Assistants and Nurse Practitioners) who all work together to provide you with the care you need, when you need it.  We recommend signing up for the patient portal called "MyChart".  Sign up information is provided on this After Visit Summary.  MyChart is used to connect with patients for Virtual Visits (Telemedicine).  Patients  are able to view lab/test results, encounter notes, upcoming appointments, etc.  Non-urgent messages can be sent to your provider as well.   To learn more about what you can do with MyChart, go to [email or web address].    Your next appointment:   3 month(s)  The format for your next appointment:   In Person  Provider:   [name], MD    [place] [id number] Dept: [facility]  09/20/2021  You are scheduled for a Cardiac Catheterization on Friday, January 6 with Dr. Larae Grooms.  1. Please arrive at the [facility] (Main Entrance A) at [facility]: [address] [place], [place] [address] at 5:30 AM (This time is two hours before your procedure to ensure your preparation). Free valet parking service is available.   Special note: Every effort is made to have your procedure done on time. Please understand that emergencies sometimes delay scheduled procedures.  2. Diet: Do not eat solid foods after midnight.  The patient may have clear liquids until 5am upon the day of the procedure.  3. Labs: You will need to have blood drawn today (CBC, BMP).  4. Medication instructions in preparation for your procedure:   Contrast Allergy: No  Hold Lasix and Hydrochlorothiazide morning of  procedure  On the morning of your procedure, take your Aspirin and any morning medicines NOT listed above.  You may use sips of water.  5. Plan for one night stay--bring personal belongings. 6. Bring a current list of your medications and current insurance cards. 7. You MUST have a responsible person to drive you home. 8. Someone MUST be with you the first 24 hours after you arrive home or your discharge will be delayed. 9. Please wear clothes that are easy to get on and off and wear slip-on shoes.  Thank you for allowing [place] to care for you!   -- [place] [facility]    I,Mykaella Javier,acting as a scribe for [name], MD.,have documented all relevant documentation on the behalf of [name], MD,as directed by  [name], MD while in the presence of [name], MD.  I, [name], MD, have reviewed all documentation for this visit. The documentation on 10/06/21 for the exam, diagnosis, procedures, and orders are all accurate and complete.   Signed, [name], MD PhD 09/20/2021 1:20 PM    [facility]

## 2021-09-20 NOTE — H&P (View-Only) (Signed)
Cardiology Office Note:    Date:  09/20/2021   ID:  Mary Chambers, DOB 1971/08/18, MRN [id number]  PCP:  [name], NP  Cardiologist:  [name], MD  Referring MD: [name], NP   CC: follow up   History of Present Illness:    Mary Chambers is a 50 y.o. female with a hx of hypertension, obesity, arthritis, and depression, who is seen for follow-up.    She presented to the ED 07/14/2021 with acute chest pain, evaluation unremarkable at that time. I met her during her hospitalization 06/2021 for paraspinal abscesses and endocarditis, with incidental Covid 19 infection at the time.  Today: She is not doing well and is tearful. She has been more fatigued recently and is worried about the next steps.  She has chest pain caused by heartburn. She reports anything, such as water, will cause acid reflux. She takes prescription and OTC medicine for the acid reflux. These medications help. However, the effects are not long lasting.   She endorses LLE edema. She notices the swelling is worse in the morning and evening. She takes her fluid pill but feels the medication works variably. She has not weighed herself in the past few weeks.  She denies shortness of breath at rest or with normal exertion, PND, orthopnea, syncope or palpitations.  Past Medical History:  Diagnosis Date   Arthritis    Depression    Hypertension    Kidney stone    Obesity     Past Surgical History:  Procedure Laterality Date   ABDOMINAL HYSTERECTOMY     BACK SURGERY     BUBBLE STUDY  08/31/2021   Procedure: BUBBLE STUDY;  Surgeon: Freada Bergeron, MD;  Location: [facility];  Service: Cardiovascular;;   CARPAL TUNNEL RELEASE     CHOLECYSTECTOMY     IR FLUORO GUIDED NEEDLE PLC ASPIRATION/INJECTION LOC  06/23/2021   LITHOTRIPSY     neck fusion     TEE WITHOUT CARDIOVERSION N/A 06/28/2021   Procedure: TRANSESOPHAGEAL ECHOCARDIOGRAM (TEE);  Surgeon:  Lelon Perla, MD;  Location: [facility] ENDOSCOPY;  Service: Cardiovascular;  Laterality: N/A;   TEE WITHOUT CARDIOVERSION N/A 08/31/2021   Procedure: TRANSESOPHAGEAL ECHOCARDIOGRAM (TEE);  Surgeon: Freada Bergeron, MD;  Location: [facility] ENDOSCOPY;  Service: Cardiovascular;  Laterality: N/A;   TUBAL LIGATION      Current Medications: Current Outpatient Medications on File Prior to Visit  Medication Sig   Ascorbic Acid (VITAMIN C) 500 MG CAPS Take 500 mg by mouth daily.   atorvastatin (LIPITOR) 10 MG tablet Take 10 mg by mouth daily.   cholecalciferol (VITAMIN D3) 25 MCG (1000 UT) tablet Take 1,000 Units by mouth daily.   enalapril (VASOTEC) 10 MG tablet Take 10 mg by mouth daily.   fluticasone (FLONASE) 50 MCG/ACT nasal spray Place 2 sprays into both nostrils daily.   furosemide (LASIX) 20 MG tablet Take 20 mg by mouth daily.   hydrochlorothiazide (HYDRODIURIL) 25 MG tablet Take 25 mg by mouth daily.   loperamide (IMODIUM) 2 MG capsule Take 1 capsule (2 mg total) by mouth as needed for diarrhea or loose stools.   Multiple Vitamin (MULTIVITAMIN) capsule Take 1 capsule by mouth daily.    naproxen (NAPROSYN) 500 MG tablet Take 1 tablet (500 mg total) by mouth 2 (two) times daily with a meal.   pantoprazole (PROTONIX) 40 MG tablet Take 40 mg by mouth daily.   Potassium (POTASSIMIN PO) Take 500 mg by mouth daily. 250 per  tab   tiZANidine (ZANAFLEX) 4 MG tablet Take 4 mg by mouth at bedtime.   traMADol (ULTRAM) 50 MG tablet Take 50 mg by mouth as needed.   No current facility-administered medications on file prior to visit.     Allergies:   Baclofen, Doxycycline, Percocet [oxycodone-acetaminophen], Septra [sulfamethoxazole-trimethoprim], and Gabapentin   Social History   Tobacco Use   Smoking status: Never   Smokeless tobacco: Never  Vaping Use   Vaping Use: Never used  Substance Use Topics   Alcohol use: No   Drug use: Never    Family History: family history includes Cancer in her  father; Diabetes in her mother; Heart disease in her brother, father, and sister; Hypertension in her mother; Parkinson's disease in her father. There is no history of Colon cancer, Rectal cancer, or Stomach cancer.  ROS:   Please see the history of present illness. (+) Tearful (+) Fatigue (+) Acid reflux (+) Chest pain (+) LLE edema All other systems are reviewed and negative.   EKGs/Labs/Other Studies Reviewed:    The following studies were reviewed today: Echo TEE 08/31/21 1. The aortic valve is bicuspid with fusion of the RCC and LCC. The  leaflets are asymmetrically thickened most notably at the [facility] leaflet tip  with concern for vegetation/infective endocarditis. There is flail of one  of the leaflets (suspect NCC) with  lack of central coaptation and resultant severe, highly eccentric aortic  regurgitation.   2. Left ventricular ejection fraction, by estimation, is 65 to 70%. The  left ventricle has normal function.   3. Right ventricular systolic function is normal. The right ventricular  size is normal.   4. No left atrial/left atrial appendage thrombus was detected.   5. The mitral valve is normal in structure. Trivial mitral valve  regurgitation.   6. Aortic dilatation noted. There is borderline dilatation of the aortic  root, measuring 36 mm. There is mild-to-moderate dilatation of the  ascending aorta, measuring 43 mm.   7. The interatrial septum is aneurysmal. There is a PFO demonstrated by  color doppler. Weakly positive agitated saline bubble study.  Comparison(s): Compared to prior TEE on 06/28/21, there continues to be  severe AR with concern for infective endocarditis.   CTA Chest 07/15/2021: COMPARISON:  Chest radiograph 07/14/2021 CT chest 04/22/2020   FINDINGS: Cardiovascular: Good opacification of the central and segmental pulmonary arteries. No focal filling defects. No evidence of significant pulmonary embolus. Normal heart size. No  pericardial effusions. Ascending aortic aneurysm measuring 4.1 cm diameter. No aortic dissection. Great vessel origins are patent.   Mediastinum/Nodes: Thyroid gland is unremarkable. Esophagus is decompressed. No significant lymphadenopathy.   Lungs/Pleura: Mild dependent changes in the lung bases. No airspace disease or consolidation. No pleural effusions. No pneumothorax.   Upper Abdomen: Surgical absence of the gallbladder. No acute abnormalities demonstrated in the upper abdomen.   Musculoskeletal: Postoperative changes in the cervical spine and lower thoracic spine. Degenerative changes. No destructive bone lesions.   Review of the MIP images confirms the above findings.   IMPRESSION: 1. No evidence of significant pulmonary embolus. 2. No evidence of active pulmonary disease. 3. 4.1 cm diameter ascending aortic aneurysm. Recommend annual imaging followup by CTA or MRA. This recommendation follows 2010 ACCF/AHA/AATS/ACR/ASA/SCA/SCAI/SIR/STS/SVM Guidelines for the Diagnosis and Management of Patients with Thoracic Aortic Disease. Circulation. 2010; 121: Z308-M578. Aortic aneurysm NOS (ICD10-I71.9)  Echo TEE 06/28/2021:  1. Bicuspid aortic valve (left and right cusps appear to be fused);  probable vegetation noted; severe AI.  2. Left ventricular ejection fraction, by estimation, is 60 to 65%. The  left ventricle has normal function. The left ventricle has no regional  wall motion abnormalities.   3. Right ventricular systolic function is normal. The right ventricular  size is normal.   4. No left atrial/left atrial appendage thrombus was detected.   5. The mitral valve is normal in structure. No evidence of mitral valve  regurgitation.   6. The aortic valve is bicuspid. Aortic valve regurgitation is severe.  Echo 06/26/2021 1. Poor quality study bad acoustic windows limited.   2. Left ventricular ejection fraction, by estimation, is 55 to 60%. The  left ventricle has  normal function. Left ventricular diastolic parameters  are indeterminate.   3. The mitral valve was not well visualized. No evidence of mitral valve  regurgitation.   4. AV not well seen Some calcification and ? mild to moderate AR Cannot  r/o vegetation Suggest TEE if clinically indicated . The aortic valve was  not well visualized.   5. The inferior vena cava is normal in size with greater than 50%  respiratory variability, suggesting right atrial pressure of 3 mmHg.   EKG:  EKG is personally reviewed.   09/20/2021: NSR at 90 bpm 07/26/2021: NSR at 98 bpm  Recent Labs: 06/29/2021: Magnesium 1.9 07/14/2021: B Natriuretic Peptide 89.5 08/02/2021: ALT 53; BUN 17; Creatinine, Ser 0.80; Hemoglobin 11.1; Platelets 285; Potassium 3.6; Sodium 142  Recent Lipid Panel No results found for: CHOL, TRIG, HDL, CHOLHDL, VLDL, LDLCALC, LDLDIRECT  Physical Exam:    VS:  BP 135/79 (BP Location: Left Arm, Patient Position: Sitting, Cuff Size: Large)    Pulse 90    Ht _0  (1.702 m)    Wt 265 lb 9.6 oz (120.5 kg)    BMI 41.60 kg/m     Wt Readings from Last 3 Encounters:  09/20/21 265 lb 9.6 oz (120.5 kg)  08/31/21 250 lb (113.4 kg)  08/02/21 260 lb (117.9 kg)    GEN: Well nourished, well developed in no acute distress HEENT: Normal, moist mucous membranes NECK: No JVD CARDIAC: regular rhythm, normal S1 and S2, no rubs or gallops. 2/6 systolic murmur and 2/4 diastolic murmur at RUSB VASCULAR: Radial and DP pulses 2+ bilaterally. No carotid bruits RESPIRATORY:  Clear to auscultation without rales, wheezing or rhonchi  ABDOMEN: Soft, non-tender, non-distended MUSCULOSKELETAL:  Ambulates independently SKIN: Warm and dry, no edema NEUROLOGIC:  Alert and oriented x 3. No focal neuro deficits noted. PSYCHIATRIC:  Normal affect    ASSESSMENT:    1. Severe aortic regurgitation   2. Pre-procedure lab exam   3. Pre-op evaluation   4. Aortic valve endocarditis   5. Bicuspid aortic valve   6.  Hypertension, benign   7. Morbidly obese (HCC)     PLAN:    Aortic valve endocarditis Severe aortic regurgitation Bicuspid aortic valve -completing antibiotic course next week -has significant dyspnea on exertion and fatigue. Suspect this is multifactorial, but severe aortic regurgitation likely contributing. -Reviewed her TEE results -we discussed cath as next step, she is amenable. Labs ordered today -will refer to surgery to be evaluated post cath.  Shared Decision Making/Informed Consent The risks [stroke (1 in 1000), death (1 in 1000), kidney failure [usually temporary] (1 in 500), bleeding (1 in 200), allergic reaction [possibly serious] (1 in 200)], benefits (diagnostic support and management of coronary artery disease) and alternatives of a cardiac catheterization were discussed in detail with Mary Chambers and she is  willing to proceed.  Hypertension: -near goal on current regimen, continue  Morbid obesity: BMI 41  Cardiac risk counseling and prevention recommendations: -recommend heart healthy/Mediterranean diet, with whole grains, fruits, vegetable, fish, lean meats, nuts, and olive oil. Limit salt. -recommend moderate walking, 3-5 times/week for 30-50 minutes each session. Aim for at least 150 minutes.week. Goal should be pace of 3 miles/hours, or walking 1.5 miles in 30 minutes -recommend avoidance of tobacco products. Avoid excess alcohol. -ASCVD risk score: The ASCVD Risk score (Arnett DK, et al., 2019) failed to calculate for the following reasons:   Cannot find a previous HDL lab   Cannot find a previous total cholesterol lab    Plan for follow up: 3 months, hopefully after surgery  [name], MD, PhD, [facility]    Medication Adjustments/Labs and Tests Ordered: Current medicines are reviewed at length with the patient today.  Concerns regarding medicines are outlined above.  Orders Placed This Encounter  Procedures    Basic metabolic panel   CBC   EKG 12-Lead    No orders of the defined types were placed in this encounter.    Patient Instructions  Medication Instructions:  Your Physician recommend you continue on your current medication as directed.    *If you need a refill on your cardiac medications before your next appointment, please call your pharmacy*   Lab Work: Your provider has recommended lab work today (CBC, BMP). Please have this collected at [facility]. The lab is open 8:00 am - 4:30 pm. Please avoid 12:00p - 1:00p for lunch hour. You do not need an appointment. Please go to [address] [place], [place] [address]. This is in the Primary Care office on the 3rd floor, let them know you are there for blood work and they will direct you to the lab.  If you have labs (blood work) drawn today and your tests are completely normal, you will receive your results only by: Ray (if you have MyChart) OR A paper copy in the mail If you have any lab test that is abnormal or we need to change your treatment, we will call you to review the results.   Testing/Procedures: Your physician has requested that you have a cardiac catheterization. Cardiac catheterization is used to diagnose and/or treat various heart conditions. Doctors may recommend this procedure for a number of different reasons. The most common reason is to evaluate chest pain. Chest pain can be a symptom of coronary artery disease (CAD), and cardiac catheterization can show whether plaque is narrowing or blocking your hearts arteries. This procedure is also used to evaluate the valves, as well as measure the blood flow and oxygen levels in different parts of your heart. For further information please visit [email or web address]. Please follow instruction sheet, as given. [facility]   Follow-Up: At [facility], you and your health needs are our priority.  As part of our  continuing mission to provide you with exceptional heart care, we have created designated Provider Care Teams.  These Care Teams include your primary Cardiologist (physician) and Advanced Practice Providers (APPs -  Physician Assistants and Nurse Practitioners) who all work together to provide you with the care you need, when you need it.  We recommend signing up for the patient portal called "MyChart".  Sign up information is provided on this After Visit Summary.  MyChart is used to connect with patients for Virtual Visits (Telemedicine).  Patients  are able to view lab/test results, encounter notes, upcoming appointments, etc.  Non-urgent messages can be sent to your provider as well.   To learn more about what you can do with MyChart, go to [email or web address].    Your next appointment:   3 month(s)  The format for your next appointment:   In Person  Provider:   [name], MD    [address] [place] [id number] Dept: [place]  09/20/2021  You are scheduled for a Cardiac Catheterization on Friday, January 6 with Dr. Larae Grooms.  1. Please arrive at the [facility] (Main Entrance A) at [facility]: [address] [place], [place] [address] at 5:30 AM (This time is two hours before your procedure to ensure your preparation). Free valet parking service is available.   Special note: Every effort is made to have your procedure done on time. Please understand that emergencies sometimes delay scheduled procedures.  2. Diet: Do not eat solid foods after midnight.  The patient may have clear liquids until 5am upon the day of the procedure.  3. Labs: You will need to have blood drawn today (CBC, BMP).  4. Medication instructions in preparation for your procedure:   Contrast Allergy: No  Hold Lasix and Hydrochlorothiazide morning of  procedure  On the morning of your procedure, take your Aspirin and any morning medicines NOT listed above.  You may use sips of water.  5. Plan for one night stay--bring personal belongings. 6. Bring a current list of your medications and current insurance cards. 7. You MUST have a responsible person to drive you home. 8. Someone MUST be with you the first 24 hours after you arrive home or your discharge will be delayed. 9. Please wear clothes that are easy to get on and off and wear slip-on shoes.  Thank you for allowing [place] to care for you!   -- [facility]    I,Mykaella Javier,acting as a scribe for [name], MD.,have documented all relevant documentation on the behalf of [name], MD,as directed by  [name], MD while in the presence of [name], MD.  I, [name], MD, have reviewed all documentation for this visit. The documentation on 10/06/21 for the exam, diagnosis, procedures, and orders are all accurate and complete.   Signed, [name], MD PhD 09/20/2021 1:20 PM    [facility]

## 2021-09-23 ENCOUNTER — Encounter (HOSPITAL_BASED_OUTPATIENT_CLINIC_OR_DEPARTMENT_OTHER): Payer: Self-pay

## 2021-09-23 DIAGNOSIS — Z01812 Encounter for preprocedural laboratory examination: Secondary | ICD-10-CM

## 2021-09-23 LAB — CBC
Hematocrit: 40.5 % (ref 34.0–46.6)
Hemoglobin: 12.6 g/dL (ref 11.1–15.9)
MCH: 26.2 pg — ABNORMAL LOW (ref 26.6–33.0)
MCHC: 31.1 g/dL — ABNORMAL LOW (ref 31.5–35.7)
MCV: 84 fL (ref 79–97)
Platelets: 254 10*3/uL (ref 150–450)
RBC: 4.81 x10E6/uL (ref 3.77–5.28)
RDW: 14.5 % (ref 11.7–15.4)
WBC: 9.1 10*3/uL (ref 3.4–10.8)

## 2021-09-23 LAB — BASIC METABOLIC PANEL

## 2021-09-27 NOTE — Telephone Encounter (Signed)
BMP was marked as insufficient sample'. Can we please have her return for BMP (order under Dr. Harrell Gave) prior to her cardiac cath 10/07/21? Preferably one day 12/27-1/3  Thanks! Loel Dubonnet

## 2021-10-04 ENCOUNTER — Encounter (HOSPITAL_BASED_OUTPATIENT_CLINIC_OR_DEPARTMENT_OTHER): Payer: Self-pay

## 2021-10-04 LAB — BASIC METABOLIC PANEL
BUN/Creatinine Ratio: 25 — ABNORMAL HIGH (ref 9–23)
BUN: 27 mg/dL — ABNORMAL HIGH (ref 6–24)
CO2: 25 mmol/L (ref 20–29)
Calcium: 9.6 mg/dL (ref 8.7–10.2)
Chloride: 100 mmol/L (ref 96–106)
Creatinine, Ser: 1.07 mg/dL — ABNORMAL HIGH (ref 0.57–1.00)
Glucose: 89 mg/dL (ref 70–99)
Potassium: 3.8 mmol/L (ref 3.5–5.2)
Sodium: 141 mmol/L (ref 134–144)
eGFR: 63 mL/min/{1.73_m2} (ref >59)

## 2021-10-04 NOTE — Telephone Encounter (Signed)
R/LHC scheduled for 10/07/20. Shortness of breath and fluid retention not surprising given aortic regurgitation. Recommend she obtain BMP today as previously instructed.   Medication recommendations: Today Additional Lasix 20mg  Tomorrow 10/05/21 Lasix 40mg  in the morning. Take double dose of OTC potassium supplement (may split into two doses, I.e. one dose in AM and one dose in PM) 10/06/21 and thereafter Lasix 20mg  QD  Take additional 20mg  of Lasix today.   Ensure eating low salt diet, drinking less than 2L fluid, elevating legs, wearing compression stockings.   Will route to RN to ask her to contact Miss Remedios Mckone with these recommendations.   Her upcoming R/LHC will give [place] further insight into her volume status.  Loel Dubonnet, NP

## 2021-10-04 NOTE — Telephone Encounter (Signed)
Pt updated with NP recommendations and verbalized understanding. Pt also report she completed blood work this morning.

## 2021-10-06 ENCOUNTER — Encounter (HOSPITAL_BASED_OUTPATIENT_CLINIC_OR_DEPARTMENT_OTHER): Payer: Self-pay | Admitting: Cardiology

## 2021-10-06 ENCOUNTER — Telehealth: Payer: Self-pay | Admitting: *Deleted

## 2021-10-06 NOTE — Telephone Encounter (Signed)
Cardiac catheterization scheduled at [facility] for: Friday October 07, 2021 7:30 [facility] Main Entrance A [facility]) at: 5:30 AM   Diet-no solid food after midnight prior to cath, clear liquids until 5 AM day of procedure.  Medication instructions for procedure: -Hold:  Lasix/HCTZ/KCl-AM of procedure -Except hold medications usual morning medications can be taken pre-cath with sips of water including aspirin 81 mg.    Confirmed patient has responsible adult to drive home post procedure and be with patient first 24 hours after arriving home.  [facility] does allow one visitor to accompany you and wait in the hospital waiting room while you are there for your procedure. You and your visitor will be asked to wear a mask once you enter the hospital.   Patient reports does not currently have any new symptoms concerning for COVID-19 and no household members with COVID-19 like illness.     Reviewed procedure/mask/visitor instructions with patient.

## 2021-10-07 ENCOUNTER — Encounter (HOSPITAL_COMMUNITY)
Admission: RE | Disposition: A | Discharge status: Home / Self Care | Payer: Self-pay | Source: Home / Self Care | Attending: Interventional Cardiology

## 2021-10-07 ENCOUNTER — Other Ambulatory Visit: Payer: Self-pay

## 2021-10-07 ENCOUNTER — Other Ambulatory Visit (HOSPITAL_BASED_OUTPATIENT_CLINIC_OR_DEPARTMENT_OTHER): Payer: Self-pay | Admitting: Cardiology

## 2021-10-07 ENCOUNTER — Encounter (HOSPITAL_COMMUNITY): Payer: Self-pay | Admitting: Interventional Cardiology

## 2021-10-07 ENCOUNTER — Ambulatory Visit (HOSPITAL_COMMUNITY)
Admission: RE | Admit: 2021-10-07 | Discharge: 2021-10-07 | Disposition: A | Discharge status: Home / Self Care | Payer: 59 | Attending: Interventional Cardiology | Admitting: Interventional Cardiology

## 2021-10-07 DIAGNOSIS — I351 Nonrheumatic aortic (valve) insufficiency: Secondary | ICD-10-CM | POA: Diagnosis not present

## 2021-10-07 DIAGNOSIS — F32A Depression, unspecified: Secondary | ICD-10-CM | POA: Insufficient documentation

## 2021-10-07 DIAGNOSIS — I352 Nonrheumatic aortic (valve) stenosis with insufficiency: Secondary | ICD-10-CM | POA: Insufficient documentation

## 2021-10-07 DIAGNOSIS — Z6841 Body Mass Index (BMI) 40.0 and over, adult: Secondary | ICD-10-CM | POA: Diagnosis not present

## 2021-10-07 DIAGNOSIS — Z8616 Personal history of COVID-19: Secondary | ICD-10-CM | POA: Insufficient documentation

## 2021-10-07 DIAGNOSIS — R0609 Other forms of dyspnea: Secondary | ICD-10-CM | POA: Diagnosis not present

## 2021-10-07 DIAGNOSIS — I10 Essential (primary) hypertension: Secondary | ICD-10-CM | POA: Insufficient documentation

## 2021-10-07 DIAGNOSIS — Q231 Congenital insufficiency of aortic valve: Secondary | ICD-10-CM

## 2021-10-07 HISTORY — PX: RIGHT/LEFT HEART CATH AND CORONARY ANGIOGRAPHY: CATH118266

## 2021-10-07 LAB — POCT I-STAT EG7
Acid-Base Excess: 4 mmol/L — ABNORMAL HIGH (ref 0.0–2.0)
Acid-Base Excess: 3 mmol/L — ABNORMAL HIGH (ref 0.0–2.0)
Bicarbonate: 29.3 mmol/L — ABNORMAL HIGH (ref 20.0–28.0)
Bicarbonate: 29.1 mmol/L — ABNORMAL HIGH (ref 20.0–28.0)
Calcium, Ion: 1.2 mmol/L (ref 1.15–1.40)
Calcium, Ion: 1.2 mmol/L (ref 1.15–1.40)
HCT: 34 % — ABNORMAL LOW (ref 36.0–46.0)
HCT: 34 % — ABNORMAL LOW (ref 36.0–46.0)
Hemoglobin: 11.6 g/dL — ABNORMAL LOW (ref 12.0–15.0)
Hemoglobin: 11.6 g/dL — ABNORMAL LOW (ref 12.0–15.0)
O2 Saturation: 71 %
O2 Saturation: 71 %
Potassium: 3.9 mmol/L (ref 3.5–5.1)
Potassium: 3.8 mmol/L (ref 3.5–5.1)
Sodium: 139 mmol/L (ref 135–145)
Sodium: 139 mmol/L (ref 135–145)
TCO2: 31 mmol/L (ref 22–32)
TCO2: 31 mmol/L (ref 22–32)
pCO2, Ven: 48.7 mmHg (ref 44.0–60.0)
pCO2, Ven: 48.5 mmHg (ref 44.0–60.0)
pH, Ven: 7.388 (ref 7.250–7.430)
pH, Ven: 7.386 (ref 7.250–7.430)
pO2, Ven: 38 mmHg (ref 32.0–45.0)
pO2, Ven: 38 mmHg (ref 32.0–45.0)

## 2021-10-07 LAB — POCT I-STAT 7, (LYTES, BLD GAS, ICA,H+H)
Acid-Base Excess: 3 mmol/L — ABNORMAL HIGH (ref 0.0–2.0)
Bicarbonate: 27.5 mmol/L (ref 20.0–28.0)
Calcium, Ion: 1.12 mmol/L — ABNORMAL LOW (ref 1.15–1.40)
HCT: 33 % — ABNORMAL LOW (ref 36.0–46.0)
Hemoglobin: 11.2 g/dL — ABNORMAL LOW (ref 12.0–15.0)
O2 Saturation: 96 %
Potassium: 3.8 mmol/L (ref 3.5–5.1)
Sodium: 140 mmol/L (ref 135–145)
TCO2: 29 mmol/L (ref 22–32)
pCO2 arterial: 41.7 mmHg (ref 32.0–48.0)
pH, Arterial: 7.427 (ref 7.350–7.450)
pO2, Arterial: 83 mmHg (ref 83.0–108.0)

## 2021-10-07 LAB — POCT ACTIVATED CLOTTING TIME
Activated Clotting Time: 221 seconds
Activated Clotting Time: 185 seconds

## 2021-10-07 SURGERY — RIGHT/LEFT HEART CATH AND CORONARY ANGIOGRAPHY
Anesthesia: LOCAL

## 2021-10-07 MED ORDER — SODIUM CHLORIDE 0.9% FLUSH: 3.0000 mL | INTRAVENOUS | Status: DC | PRN

## 2021-10-07 MED ORDER — HEPARIN (PORCINE) IN NACL 1000-0.9 UT/500ML-% IV SOLN
INTRAVENOUS | Status: DC | PRN
  Administered 2021-10-07 (×2): 500 mL

## 2021-10-07 MED ORDER — IOHEXOL 350 MG/ML SOLN
INTRAVENOUS | Status: DC | PRN
  Administered 2021-10-07: 50 mL

## 2021-10-07 MED ORDER — SODIUM CHLORIDE 0.9 % IV SOLN: INTRAVENOUS | Status: DC

## 2021-10-07 MED ORDER — MIDAZOLAM HCL 2 MG/2ML IJ SOLN
INTRAMUSCULAR | Status: AC
  Filled 2021-10-07: qty 2

## 2021-10-07 MED ORDER — HEPARIN SODIUM (PORCINE) 1000 UNIT/ML IJ SOLN
INTRAMUSCULAR | Status: DC | PRN
  Administered 2021-10-07: 6000 [IU] via INTRAVENOUS

## 2021-10-07 MED ORDER — FENTANYL CITRATE (PF) 100 MCG/2ML IJ SOLN
INTRAMUSCULAR | Status: DC | PRN
  Administered 2021-10-07 (×2): 25 ug via INTRAVENOUS

## 2021-10-07 MED ORDER — HYDRALAZINE HCL 20 MG/ML IJ SOLN: 10.0000 mg | INTRAMUSCULAR | Status: DC | PRN

## 2021-10-07 MED ORDER — ACETAMINOPHEN 325 MG PO TABS: 650.0000 mg | ORAL_TABLET | ORAL | Status: DC | PRN

## 2021-10-07 MED ORDER — MIDAZOLAM HCL 2 MG/2ML IJ SOLN
INTRAMUSCULAR | Status: DC | PRN
  Administered 2021-10-07: 1 mg via INTRAVENOUS
  Administered 2021-10-07: 2 mg via INTRAVENOUS

## 2021-10-07 MED ORDER — LABETALOL HCL 5 MG/ML IV SOLN: 10.0000 mg | INTRAVENOUS | Status: DC | PRN

## 2021-10-07 MED ORDER — VERAPAMIL HCL 2.5 MG/ML IV SOLN
INTRAVENOUS | Status: DC | PRN
  Administered 2021-10-07: 10 mL via INTRA_ARTERIAL

## 2021-10-07 MED ORDER — ONDANSETRON HCL 4 MG/2ML IJ SOLN: 4.0000 mg | Freq: Four times a day (QID) | INTRAMUSCULAR | Status: DC | PRN

## 2021-10-07 MED ORDER — VERAPAMIL HCL 2.5 MG/ML IV SOLN
INTRAVENOUS | Status: AC
  Filled 2021-10-07: qty 2

## 2021-10-07 MED ORDER — VERAPAMIL HCL 2.5 MG/ML IV SOLN
INTRAVENOUS | Status: DC | PRN
  Administered 2021-10-07: 2 mg via INTRA_ARTERIAL

## 2021-10-07 MED ORDER — LIDOCAINE HCL (PF) 1 % IJ SOLN
INTRAMUSCULAR | Status: AC
  Filled 2021-10-07: qty 30

## 2021-10-07 MED ORDER — LIDOCAINE HCL (PF) 1 % IJ SOLN
INTRAMUSCULAR | Status: DC | PRN
  Administered 2021-10-07: 15 mL via INTRADERMAL
  Administered 2021-10-07 (×2): 2 mL via INTRADERMAL

## 2021-10-07 MED ORDER — SODIUM CHLORIDE 0.9% FLUSH: 3.0000 mL | Freq: Two times a day (BID) | INTRAVENOUS | Status: DC

## 2021-10-07 MED ORDER — HEPARIN (PORCINE) IN NACL 1000-0.9 UT/500ML-% IV SOLN
INTRAVENOUS | Status: AC
  Filled 2021-10-07: qty 1000

## 2021-10-07 MED ORDER — HEPARIN SODIUM (PORCINE) 1000 UNIT/ML IJ SOLN
INTRAMUSCULAR | Status: AC
  Filled 2021-10-07: qty 10

## 2021-10-07 MED ORDER — SODIUM CHLORIDE 0.9 % IV SOLN: 250.0000 mL | INTRAVENOUS | Status: DC | PRN

## 2021-10-07 MED ORDER — ASPIRIN 81 MG PO CHEW: 81.0000 mg | CHEWABLE_TABLET | ORAL | Status: AC

## 2021-10-07 MED ORDER — FENTANYL CITRATE (PF) 100 MCG/2ML IJ SOLN
INTRAMUSCULAR | Status: AC
  Filled 2021-10-07: qty 2

## 2021-10-07 SURGICAL SUPPLY — 15 items
CATH 5FR JL3.5 JR4 ANG PIG MP (CATHETERS) ×1 IMPLANT
CATH BALLN WEDGE 5F 110CM (CATHETERS) ×1 IMPLANT
CATH SWAN GANZ 7F STRAIGHT (CATHETERS) ×1 IMPLANT
DEVICE RAD COMP TR BAND LRG (VASCULAR PRODUCTS) ×1 IMPLANT
GLIDESHEATH SLEND SS 6F .021 (SHEATH) ×1 IMPLANT
GUIDEWIRE .025 260CM (WIRE) ×1 IMPLANT
GUIDEWIRE INQWIRE 1.5J.035X260 (WIRE) IMPLANT
INQWIRE 1.5J .035X260CM (WIRE) ×2
KIT HEART LEFT (KITS) ×2 IMPLANT
PACK CARDIAC CATHETERIZATION (CUSTOM PROCEDURE TRAY) ×2 IMPLANT
SHEATH GLIDE SLENDER 4/5FR (SHEATH) ×2 IMPLANT
SHEATH PINNACLE 7F 10CM (SHEATH) ×1 IMPLANT
SHEATH PROBE COVER 6X72 (BAG) ×1 IMPLANT
TRANSDUCER W/STOPCOCK (MISCELLANEOUS) ×2 IMPLANT
TUBING CIL FLEX 10 FLL-RA (TUBING) ×2 IMPLANT

## 2021-10-07 NOTE — Progress Notes (Signed)
Site area: rt groin venous sheath Site Prior to Removal:  Level 0 Pressure Applied For: 15 minutes Manual:   yes Patient Status During Pull:  stable Post Pull Site:  Level 0 Post Pull Instructions Given:  yes Post Pull Pulses Present: rt dp palpable Dressing Applied:  gauze and tegaderm Bedrest begins @ 1000 Comments:

## 2021-10-07 NOTE — Interval H&P Note (Signed)
Cath Lab Visit (complete for each Cath Lab visit)  Clinical Evaluation Leading to the Procedure:   ACS: No.  Non-ACS:    Anginal Classification: CCS III  Anti-ischemic medical therapy: Minimal Therapy (1 class of medications)  Non-Invasive Test Results: No non-invasive testing performed  Prior CABG: No previous CABG  Planned diagnostic due to severe AI    History and Physical Interval Note:  10/07/2021 7:43 AM  Mary Chambers  has presented today for surgery, with the diagnosis of pre op.  The various methods of treatment have been discussed with the patient and family. After consideration of risks, benefits and other options for treatment, the patient has consented to  Procedure(s): RIGHT/LEFT HEART CATH AND CORONARY ANGIOGRAPHY (N/A) as a surgical intervention.  The patient's history has been reviewed, patient examined, no change in status, stable for surgery.  I have reviewed the patient's chart and labs.  Questions were answered to the patient's satisfaction.     Larae Grooms

## 2021-10-21 ENCOUNTER — Ambulatory Visit (INDEPENDENT_AMBULATORY_CARE_PROVIDER_SITE_OTHER): Payer: 59 | Admitting: Thoracic Surgery (Cardiothoracic Vascular Surgery)

## 2021-10-21 ENCOUNTER — Other Ambulatory Visit: Payer: Self-pay

## 2021-10-21 ENCOUNTER — Other Ambulatory Visit: Payer: Self-pay | Admitting: Thoracic Surgery (Cardiothoracic Vascular Surgery)

## 2021-10-21 ENCOUNTER — Other Ambulatory Visit: Payer: Self-pay | Admitting: *Deleted

## 2021-10-21 ENCOUNTER — Encounter: Payer: Self-pay | Admitting: *Deleted

## 2021-10-21 VITALS — BP 175/90 | HR 100 | Resp 20 | Ht 67.0 in | Wt 260.0 lb

## 2021-10-21 DIAGNOSIS — I351 Nonrheumatic aortic (valve) insufficiency: Secondary | ICD-10-CM

## 2021-10-21 DIAGNOSIS — I358 Other nonrheumatic aortic valve disorders: Secondary | ICD-10-CM | POA: Diagnosis not present

## 2021-10-21 DIAGNOSIS — I350 Nonrheumatic aortic (valve) stenosis: Secondary | ICD-10-CM

## 2021-10-21 DIAGNOSIS — Z736 Limitation of activities due to disability: Secondary | ICD-10-CM

## 2021-10-21 NOTE — Progress Notes (Signed)
° °   °  [address]       [place] [address]             [phone]        Mary Chambers [place] Medical Record #[id number] Date of Birth: Nov 20, 1970  Referring: [name],* Primary Care: [name], NP Primary Cardiologist:Bridgette Shakenna Herrero Gave, MD  Reason for visit:   follow-up  History of Present Illness:     51 year old female previously evaluated for severe regurgitation secondary to aortic valve endocarditis.  She was admitted with a spinal abscess which required drainage.  This is since been treated and she is no longer on antibiotics.  From a cardiovascular standpoint she remains very short of breath with minimal activity.  She has been able to go back to work but is limited due to her dyspnea.  Physical Exam: BP (!) 175/90    Pulse 100    Resp 20    Ht 5\' 7"  (1.702 m)    Wt 260 lb (117.9 kg)    SpO2 94% Comment: RA   BMI 40.72 kg/m   Alert NAD Tachycardia.  Positive for murmur. Abdomen soft, ND No peripheral edema   Diagnostic Studies & Laboratory data: Left heart catheterization: No significant coronary disease.    Assessment / Plan:   51 year old female with history of aortic valve endocarditis in the setting of septic spinal abscess.  She is scheduled to undergo dental extractions getting February.  We will repeat her echo her left heart function.  She is agreeable to proceed with a mechanical aortic valve.  She is tentatively scheduled for November 09, 2021.   Lajuana Matte 10/21/2021 4:14 PM

## 2021-10-21 NOTE — H&P (View-Only) (Signed)
° °   °  [address]       [place] [address]             [phone]        Mary Chambers Medical Record #[id number] Date of Birth: 29-Mar-1971  Referring: [name],* Primary Care: [name], NP Primary Cardiologist:Bridgette Adrianah Prophete Gave, MD  Reason for visit:   follow-up  History of Present Illness:     51 year old female previously evaluated for severe regurgitation secondary to aortic valve endocarditis.  She was admitted with a spinal abscess which required drainage.  This is since been treated and she is no longer on antibiotics.  From a cardiovascular standpoint she remains very short of breath with minimal activity.  She has been able to go back to work but is limited due to her dyspnea.  Physical Exam: BP (!) 175/90    Pulse 100    Resp 20    Ht 5\' 7"  (1.702 m)    Wt 260 lb (117.9 kg)    SpO2 94% Comment: RA   BMI 40.72 kg/m   Alert NAD Tachycardia.  Positive for murmur. Abdomen soft, ND No peripheral edema   Diagnostic Studies & Laboratory data: Left heart catheterization: No significant coronary disease.    Assessment / Plan:   51 year old female with history of aortic valve endocarditis in the setting of septic spinal abscess.  She is scheduled to undergo dental extractions getting February.  We will repeat her echo her left heart function.  She is agreeable to proceed with a mechanical aortic valve.  She is tentatively scheduled for November 09, 2021.   Lajuana Matte 10/21/2021 4:14 PM

## 2021-10-24 ENCOUNTER — Other Ambulatory Visit: Payer: Self-pay

## 2021-10-24 ENCOUNTER — Emergency Department (HOSPITAL_COMMUNITY): Payer: 59

## 2021-10-24 ENCOUNTER — Encounter (HOSPITAL_BASED_OUTPATIENT_CLINIC_OR_DEPARTMENT_OTHER): Payer: Self-pay | Admitting: Emergency Medicine

## 2021-10-24 ENCOUNTER — Emergency Department (HOSPITAL_BASED_OUTPATIENT_CLINIC_OR_DEPARTMENT_OTHER)
Admission: EM | Admit: 2021-10-24 | Discharge: 2021-10-25 | Disposition: A | Discharge status: Home / Self Care | Payer: 59 | Attending: Emergency Medicine | Admitting: Emergency Medicine

## 2021-10-24 DIAGNOSIS — M7138 Other bursal cyst, other site: Secondary | ICD-10-CM

## 2021-10-24 DIAGNOSIS — M5441 Lumbago with sciatica, right side: Secondary | ICD-10-CM | POA: Diagnosis not present

## 2021-10-24 DIAGNOSIS — I10 Essential (primary) hypertension: Secondary | ICD-10-CM | POA: Diagnosis not present

## 2021-10-24 DIAGNOSIS — M545 Low back pain, unspecified: Secondary | ICD-10-CM | POA: Diagnosis present

## 2021-10-24 LAB — CBC WITH DIFFERENTIAL/PLATELET
Abs Immature Granulocytes: 0.02 10*3/uL (ref 0.00–0.07)
Basophils Absolute: 0.1 10*3/uL (ref 0.0–0.1)
Basophils Relative: 1 %
Eosinophils Absolute: 0.3 10*3/uL (ref 0.0–0.5)
Eosinophils Relative: 4 %
HCT: 40.1 % (ref 36.0–46.0)
Hemoglobin: 12.4 g/dL (ref 12.0–15.0)
Immature Granulocytes: 0 %
Lymphocytes Relative: 29 %
Lymphs Abs: 2.4 10*3/uL (ref 0.7–4.0)
MCH: 25.7 pg — ABNORMAL LOW (ref 26.0–34.0)
MCHC: 30.9 g/dL (ref 30.0–36.0)
MCV: 83 fL (ref 80.0–100.0)
Monocytes Absolute: 0.6 10*3/uL (ref 0.1–1.0)
Monocytes Relative: 7 %
Neutro Abs: 4.8 10*3/uL (ref 1.7–7.7)
Neutrophils Relative %: 59 %
Platelets: 263 10*3/uL (ref 150–400)
RBC: 4.83 MIL/uL (ref 3.87–5.11)
RDW: 14.7 % (ref 11.5–15.5)
WBC: 8.2 10*3/uL (ref 4.0–10.5)
nRBC: 0 % (ref 0.0–0.2)

## 2021-10-24 LAB — BASIC METABOLIC PANEL
Anion gap: 9 (ref 5–15)
BUN: 22 mg/dL — ABNORMAL HIGH (ref 6–20)
CO2: 28 mmol/L (ref 22–32)
Calcium: 9.4 mg/dL (ref 8.9–10.3)
Chloride: 104 mmol/L (ref 98–111)
Creatinine, Ser: 0.91 mg/dL (ref 0.44–1.00)
GFR, Estimated: >60 mL/min (ref >60)
Glucose, Bld: 86 mg/dL (ref 70–99)
Potassium: 4.4 mmol/L (ref 3.5–5.1)
Sodium: 141 mmol/L (ref 135–145)

## 2021-10-24 LAB — LACTIC ACID, PLASMA: Lactic Acid, Venous: 0.9 mmol/L (ref 0.5–1.9)

## 2021-10-24 LAB — C-REACTIVE PROTEIN: CRP: 0.6 mg/dL (ref <1.0)

## 2021-10-24 LAB — SEDIMENTATION RATE: Sed Rate: 25 mm/hr — ABNORMAL HIGH (ref 0–22)

## 2021-10-24 IMAGING — MR MR LUMBAR SPINE WO/W CM
4 of 7 series · 17 of 48 positions shown · IV contrast (gadavist)
Comparison: [DATE].

CLINICAL DATA: Low back pain, infection suspected

EXAM:
MRI LUMBAR SPINE WITHOUT AND WITH CONTRAST
TECHNIQUE: Multiplanar and multiecho pulse sequences of the lumbar spine were
obtained without and with intravenous contrast.
CONTRAST:  10mL GADAVIST GADOBUTROL 1 MMOL/ML IV SOLN

[Series 5: T1 · sagittal · 4.0mm · 0.51mm/px · 3 of 18 slices shown (1 of 2)]
[im 1/18]
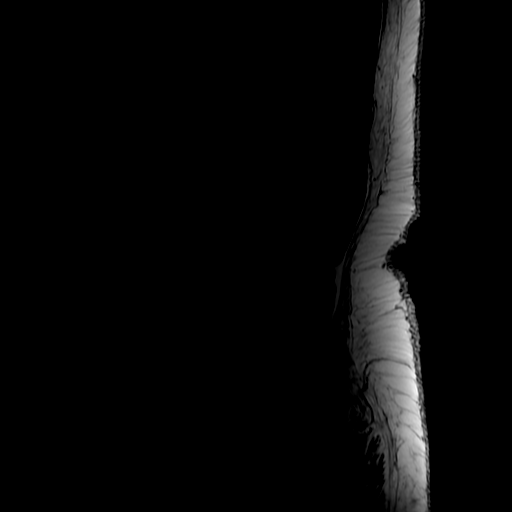
[im 9/18]
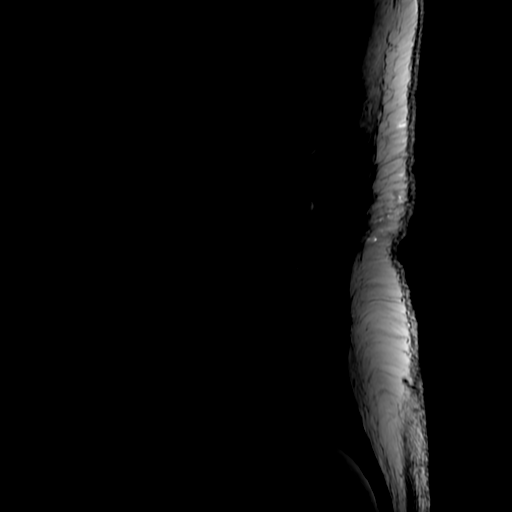
[im 18/18]
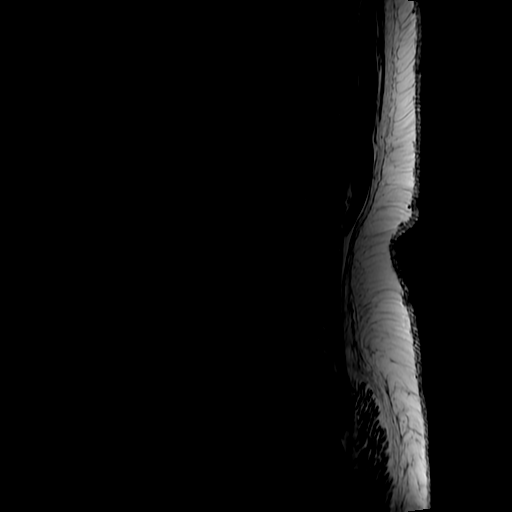

[Series 6: T2 · sagittal · 4.0mm · 0.51mm/px · 4 of 18 slices shown (1 of 2)]
[im 1/18]
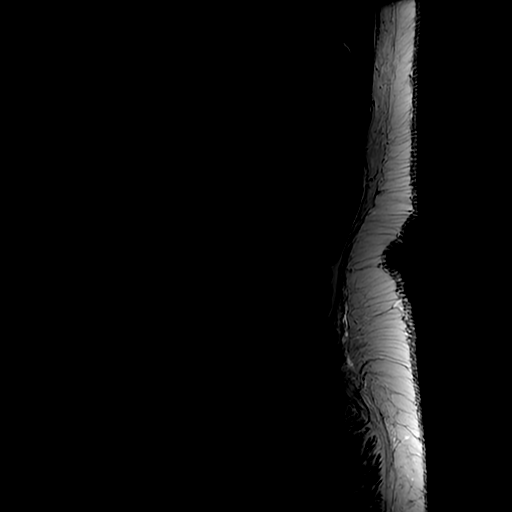
[im 6/18]
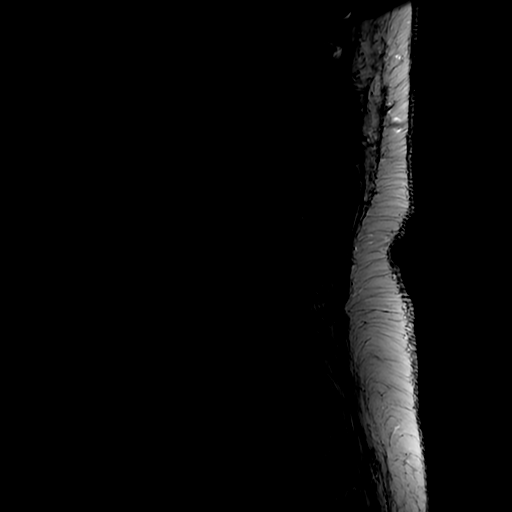
[im 12/18]
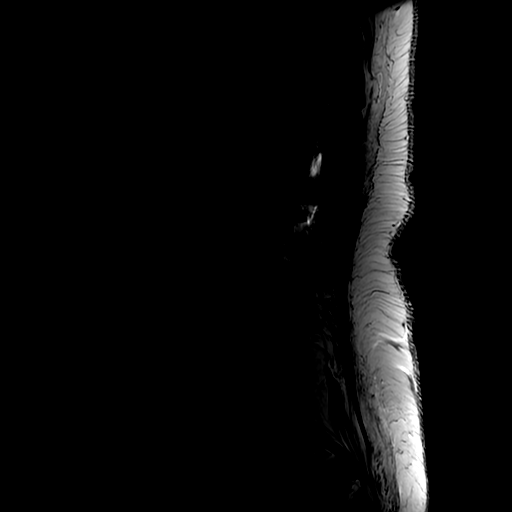
[im 18/18]
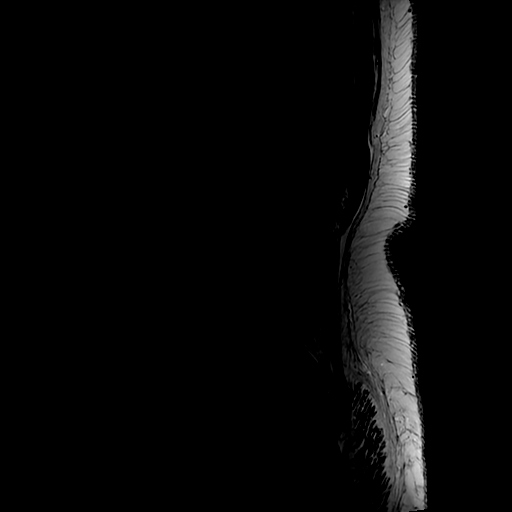

[Series 7: T2 · axial · 4.0mm · 0.39mm/px · z∈[+31,+201]mm · 7 of 40 slices shown (2 of 2)]
[im 1/40]
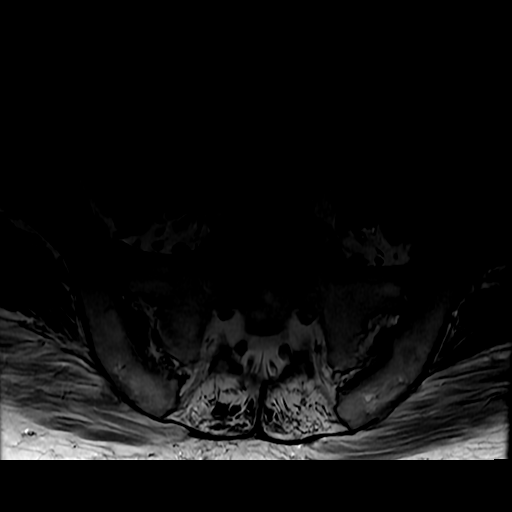
[im 5/40]
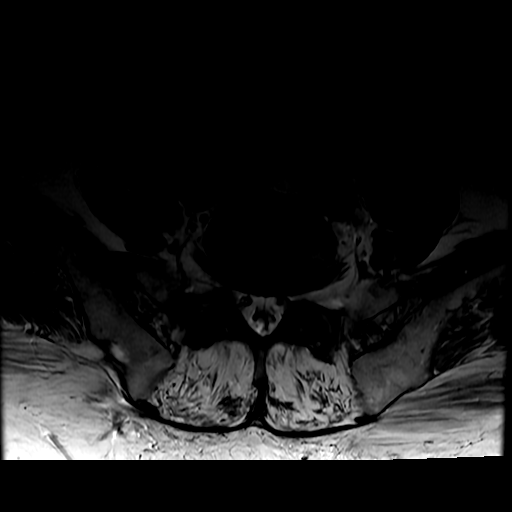
[im 14/40]
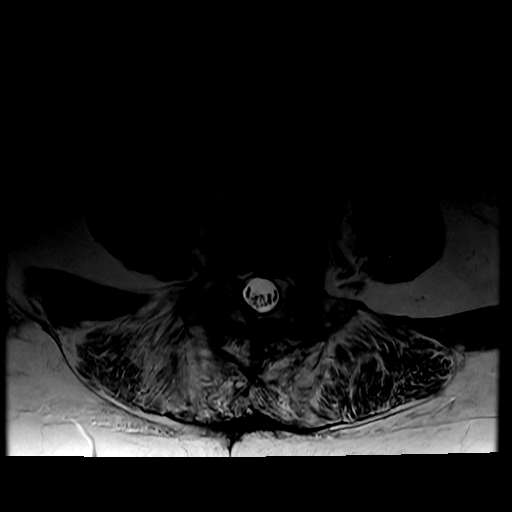
[im 18/40]
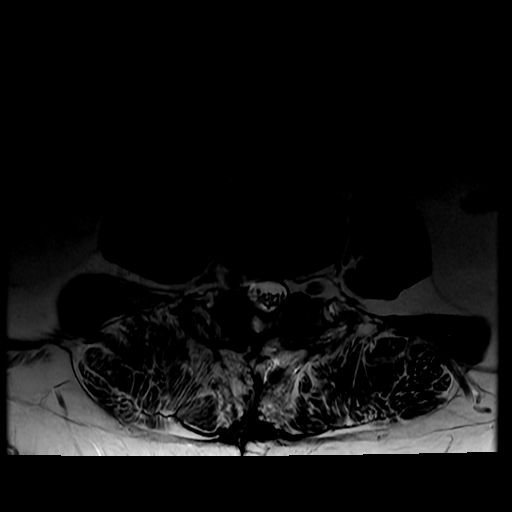
[im 22/40]
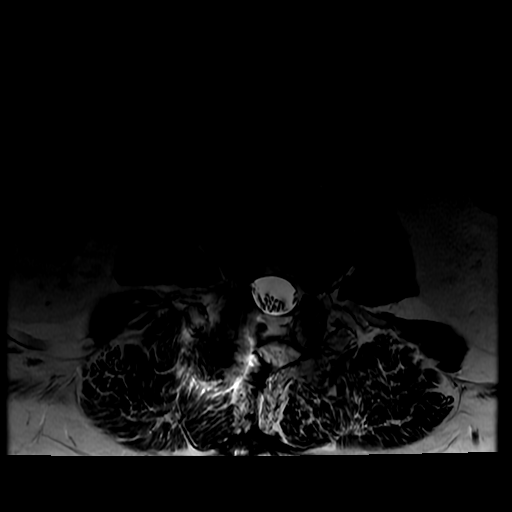
[im 27/40]
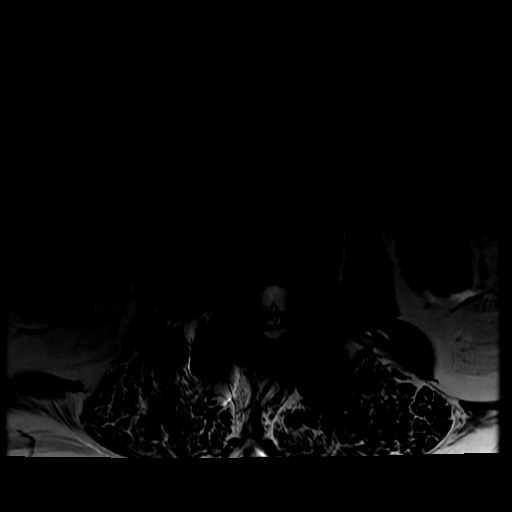
[im 35/40]
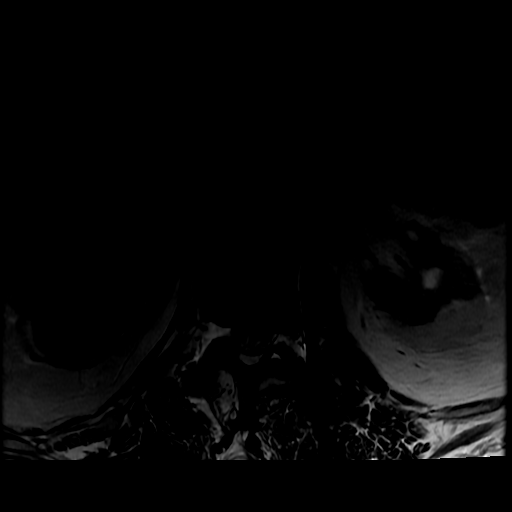

[Series 8: T1 · axial · 4.0mm · 0.39mm/px · z∈[+51,+201]mm · 3 of 40 slices shown (2 of 2)]
[im 5/40]
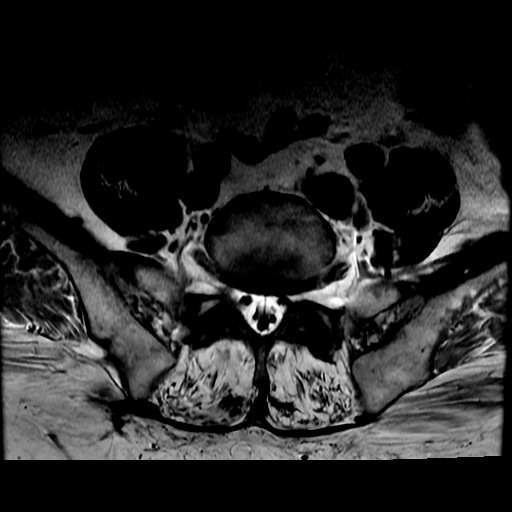
[im 22/40]
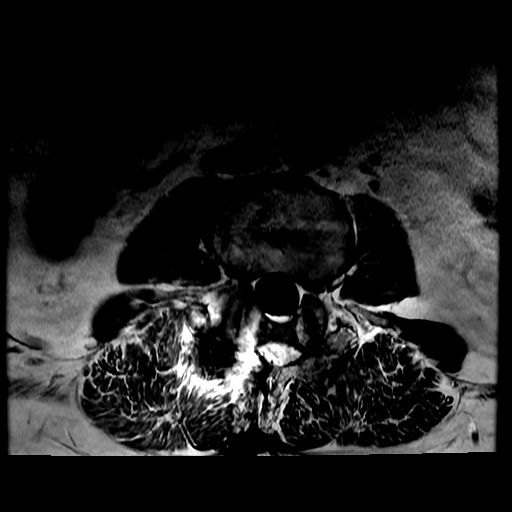
[im 35/40]
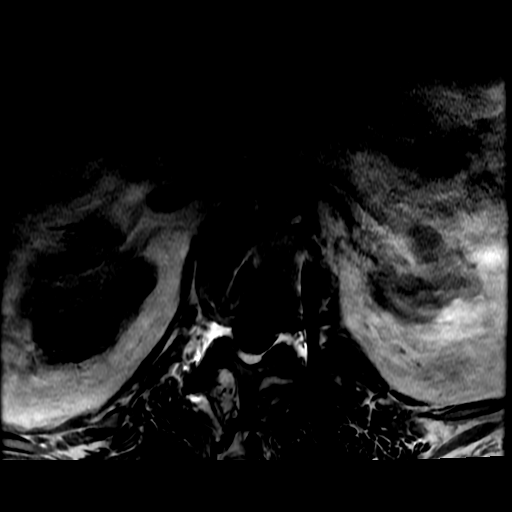

[17 of 48 positions shown; findings below may reference images not displayed]

FINDINGS: Segmentation:  Standard.

Alignment: Dextrocurvature of the thoracolumbar junction.
Levocurvature of the lumbar spine. Trace retrolisthesis T12 on L1
and L1 on L2. Trace anterolisthesis L3 on L4 and L4 on L5.

Vertebrae: No acute fracture or suspicious osseous lesion. No
abnormal osseous enhancement. Status post L1-L2 posterior fusion and
decompression, with fracture of the right transpedicular screw
better seen on the [DATE] CT. Additional posterior decompression
at L2-L3 and L3-L4 and right hemilaminectomy at L4-L5.

Conus medullaris and cauda equina: Conus extends to the T12 level.
Conus and cauda equina appear normal. Previously noted epidural
enhancement about the lower thecal sac also appears to have
resolved.

Paraspinal and other soft tissues: Previously noted enhancement
involving the right posterior paraspinous soft tissues is no longer
seen, nor is the rim enhancing collection previously noted extending
from the right L4-L5 facet. Mildly increased T2 signal in this area
could represent edema.

Disc levels:

T12-L1: Trace retrolisthesis with left subarticular disc protrusion,
which likely contacts the descending left L1 nerve. Moderate facet
arthropathy. No spinal canal stenosis or neural foraminal narrowing.

L1-L2: Status post fusion and decompression. Trace retrolisthesis
with mild disc bulge. No spinal canal stenosis or definite neural
foraminal narrowing.

L2-L3: Disc height loss and minimal disc bulge. Mild facet
arthropathy. No spinal canal stenosis. Mild right neural foraminal
narrowing, unchanged.

L3-L4: Trace anterolisthesis with disc unroofing and mild disc
bulge. Prior decompression. Moderate to severe facet arthropathy. No
spinal canal stenosis. Unchanged mild right neural foraminal
narrowing.

L4-L5: Moderate disc bulge with superimposed left subarticular
protrusion. Severe facet arthropathy, with increased fluid in the
facets, which are widened compared to the prior exam. New synovial
cyst extending anteromedially from the left facets, measuring 6 x 4
x 9 mm (AP x TR x CC) (series 7, image 30 and series 6, image 10).
There is significant effacement of the left lateral recess, with
likely compression of the descending left L5 nerve roots. No spinal
canal stenosis. Mild left neural foraminal narrowing. Additional
small synovial cyst extending from the left facet, directed
posteriorly (series 7, image 30), as well as from the spinous
process, between the L4 and L5 spinous processes (series 4, image
10).

L5-S1: Mild disc bulge. Moderate facet arthropathy. No spinal canal
stenosis or neural foraminal narrowing.
IMPRESSION: 1. Interval resolution of previously noted epidural enhancement
about the lower thecal sac, enhancement involving the right
posterior paraspinous muscles and soft tissues, and rim enhancing
collection extending from the right L4-L5 facet. No evidence of
discitis, osteomyelitis, or cellulitis, although there appears to be
mild edema in the paraspinous musculature.
2. New synovial cyst extending medially anteromedially from the left
facets at L4-L5, which causes significant effacement of the left
lateral recess and likely compresses the descending left L5 nerve
roots. In addition, at this level, there is unchanged mild left
neural foraminal narrowing.
3. Increased fluid in the L4-L5 facets, compared to the prior exam,
concerning for facetitis, with additional synovial cysts that do not
affect the spinal canal. Addition there is mild widening of the
facets, which can indicate instability.
4. No new spinal canal stenosis or neural foraminal narrowing.
Redemonstrated mild right neural foraminal narrowing at L2-L3 and
L3-L4.
5. Increased T2 signal between the L4 and L5 spinous processes,
concerning for [place] disease. No abnormal enhancement in this
area.

## 2021-10-24 MED ORDER — MORPHINE SULFATE (PF) 4 MG/ML IV SOLN
4.0000 mg | Freq: Once | INTRAVENOUS | Status: AC
  Administered 2021-10-24: 4 mg via INTRAVENOUS
  Filled 2021-10-24: qty 1

## 2021-10-24 MED ORDER — LORAZEPAM 2 MG/ML IJ SOLN
1.0000 mg | Freq: Once | INTRAMUSCULAR | Status: AC
  Administered 2021-10-24: 1 mg via INTRAVENOUS
  Filled 2021-10-24: qty 1

## 2021-10-24 MED ORDER — MORPHINE SULFATE (PF) 4 MG/ML IV SOLN
4.0000 mg | Freq: Once | INTRAVENOUS | Status: DC
  Filled 2021-10-24: qty 1

## 2021-10-24 MED ORDER — METHYLPREDNISOLONE SODIUM SUCC 125 MG IJ SOLR
125.0000 mg | Freq: Once | INTRAMUSCULAR | Status: AC
  Administered 2021-10-24: 125 mg via INTRAVENOUS
  Filled 2021-10-24: qty 2

## 2021-10-24 MED ORDER — EPINEPHRINE 0.3 MG/0.3ML IJ SOAJ: 0.3000 mg | Freq: Once | INTRAMUSCULAR | Status: DC

## 2021-10-24 MED ORDER — DIPHENHYDRAMINE HCL 25 MG PO CAPS
25.0000 mg | ORAL_CAPSULE | Freq: Once | ORAL | Status: AC
  Administered 2021-10-24: 25 mg via ORAL
  Filled 2021-10-24: qty 1

## 2021-10-24 MED ORDER — FENTANYL CITRATE PF 50 MCG/ML IJ SOSY
75.0000 ug | PREFILLED_SYRINGE | Freq: Once | INTRAMUSCULAR | Status: AC
  Administered 2021-10-24: 75 ug via INTRAVENOUS
  Filled 2021-10-24: qty 2

## 2021-10-24 MED ORDER — GADOBUTROL 1 MMOL/ML IV SOLN
10.0000 mL | Freq: Once | INTRAVENOUS | Status: AC | PRN
  Administered 2021-10-24: 10 mL via INTRAVENOUS

## 2021-10-24 MED ORDER — HYDROMORPHONE HCL 1 MG/ML IJ SOLN: 1.0000 mg | Freq: Once | INTRAMUSCULAR | Status: DC

## 2021-10-24 MED ORDER — FAMOTIDINE IN NACL 20-0.9 MG/50ML-% IV SOLN
20.0000 mg | Freq: Once | INTRAVENOUS | Status: AC
  Administered 2021-10-24: 20 mg via INTRAVENOUS
  Filled 2021-10-24: qty 50

## 2021-10-24 NOTE — ED Provider Notes (Signed)
°  Physical Exam  BP (!) 142/76 (BP Location: Left Arm)    Pulse 90    Temp 98 F (36.7 C) (Oral)    Resp 16    Ht 5\' 7"  (1.702 m)    Wt 115.2 kg    SpO2 97%    BMI 39.78 kg/m   Physical Exam  Procedures  Procedures  ED Course / MDM   Clinical Course as of 10/24/21 2322  Mon Oct 24, 2021  1434 Discussed with Dr. Regenia Skeeter for ED to ED transfer for MRI lumbar spine who accepts transfer. MRI lumbar spine order placed [CA]    Clinical Course User Index [CA] Suzy Bouchard, PA-C   Medical Decision Making Amount and/or Complexity of Data Reviewed Labs: ordered. Radiology: ordered.  Risk Prescription drug management.   Pt with hx of septic arthritis of spine and surrounding, endocarditis comes in with cc of lower back pain with R sided radicular symptoms. The infectious process occurred in Sept, she is post antibiotics.  She had come in with back pain and right-sided radicular symptoms.  MRI was ordered, which reveals synovial cyst.  No concerning findings from infection perspective.   Discussed case with neurosurgery team, they have reviewed the MRI and recommend that patient follow-up with Dr. Vertell Limber as planned next week.  Additionally they want [place] to start patient on Medrol Dosepak.  The patient appears reasonably screened and/or stabilized for discharge and I doubt any other medical condition or other [facility] requiring further screening, evaluation, or treatment in the ED at this time prior to discharge.   Results from the ER workup discussed with the patient face to face and all questions answered to the best of my ability. The patient is safe for discharge with strict return precautions.          Varney Biles, MD 10/25/21 [phone]

## 2021-10-24 NOTE — ED Notes (Signed)
Pt here from [facility] ED for MRI to rule out recurrence of spinal abscess.

## 2021-10-24 NOTE — ED Triage Notes (Signed)
Pt via pov from home with right-sided lower back pain that radiates down her right leg. Pt was seen for the same in September and had an abscess and had to be admitted. Pt states this pain is very similar and that she is using a cane to get around due to the pain. Pt alert & oriented, nad noted.

## 2021-10-24 NOTE — ED Provider Notes (Signed)
[facility] EMERGENCY DEPARTMENT Provider Note   CSN: [id number] Arrival date & time: 10/24/21  1112     History  Chief Complaint  Patient presents with   MRI    Mary Chambers is a 51 y.o. female.  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  Initially seen at one of our stand-alone emergency rooms and referred here for MRI.  Patient is following as an outpatient for her endocarditis, is scheduled to have surgery in 2 weeks with Dr. Kipp Brood.  In regards to the lumbar spine septic arthritis/abscess she had been cleared from neurosurgery and ID and is no longer on antibiotics.  Was pain-free for November and December.  Over the last 3 days has been having worsening lower back pain.  Denies any trauma or acute change.  No urinary symptoms.  States that the septic arthritis/endocarditis was attributed to an untreated UTI.  Denies any IV drug abuse.  Home Medications Prior to Admission medications   Medication Sig Start Date End Date Taking? Authorizing Provider  Ascorbic Acid (VITAMIN C) 500 MG CAPS Take 500 mg by mouth daily.    [provider]  atorvastatin (LIPITOR) 10 MG tablet Take 10 mg by mouth daily. 04/27/21   [provider]  cholecalciferol (VITAMIN D3) 25 MCG (1000 UT) tablet Take 1,000 Units by mouth daily.    [provider]  enalapril (VASOTEC) 10 MG tablet Take 10 mg by mouth daily.    [provider]  fluticasone (FLONASE) 50 MCG/ACT nasal spray Place 2 sprays into both nostrils daily. Patient taking differently: Place 2 sprays into both nostrils daily as needed for allergies. 09/04/21   Evelina Dun A, FNP   furosemide (LASIX) 20 MG tablet Take 20 mg by mouth daily. 05/31/21   [provider]  loperamide (IMODIUM) 2 MG capsule Take 1 capsule (2 mg total) by mouth as needed for diarrhea or loose stools. 07/01/21   Nita Sells, MD  Multiple Vitamin (MULTIVITAMIN) capsule Take 1 capsule by mouth daily.     [provider]  naproxen (NAPROSYN) 500 MG tablet Take 1 tablet (500 mg total) by mouth 2 (two) times daily with a meal. 08/18/21   Brunetta Jeans, PA-C  pantoprazole (PROTONIX) 40 MG tablet Take 40 mg by mouth daily. 04/22/21   [provider]  Potassium (POTASSIMIN PO) Take 500 mg by mouth daily. 250 per tab    [provider]  tiZANidine (ZANAFLEX) 4 MG tablet Take 4 mg by mouth at bedtime as needed for muscle spasms.    [provider]  traMADol (ULTRAM) 50 MG tablet Take 50 mg by mouth every 6 (six) hours as needed for severe pain. 08/29/21   [provider]      Allergies    Baclofen, Dilaudid [hydromorphone], Doxycycline, Percocet [oxycodone-acetaminophen], Septra [sulfamethoxazole-trimethoprim], and Gabapentin    Review of Systems   Review of Systems  Constitutional:  Negative for chills and fever.  Respiratory:  Negative for shortness of breath.   Cardiovascular:  Negative for chest pain.  Gastrointestinal:  Negative for abdominal pain, diarrhea and vomiting.  Musculoskeletal:  Positive for back pain. Negative for neck pain.  Skin:  Negative for rash.  Neurological:  Positive for  numbness. Negative for tremors, weakness and headaches.   Physical Exam Updated Vital Signs BP (!) 142/76 (BP Location: Left Arm)    Pulse 90    Temp 98 F (36.7 C) (Oral)    Resp 16    Ht 5\' 7"  (1.702 m)    Wt 115.2 kg    SpO2 97%    BMI 39.78 kg/m  Physical Exam Vitals and nursing note reviewed.  Constitutional:      General: She is not in acute distress.    Appearance: Normal appearance.  HENT:     Head: Normocephalic.      Mouth/Throat:     Mouth: Mucous membranes are moist.  Cardiovascular:     Rate and Rhythm: Normal rate.  Pulmonary:     Effort: Pulmonary effort is normal. No respiratory distress.  Abdominal:     Palpations: Abdomen is soft.     Tenderness: There is no abdominal tenderness.  Musculoskeletal:     Comments: TTP lower back, no overlying skin changes.  Bilateral lower extremities appear neurovascularly intact.  Skin:    General: Skin is warm.  Neurological:     Mental Status: She is alert and oriented to person, place, and time. Mental status is at baseline.  Psychiatric:        Mood and Affect: Mood normal.    ED Results / Procedures / Treatments   Labs (all labs ordered are listed, but only abnormal results are displayed) Labs Reviewed  CBC WITH DIFFERENTIAL/PLATELET - Abnormal; Notable for the following components:      Result Value   MCH 25.7 (*)    All other components within normal limits  BASIC METABOLIC PANEL - Abnormal; Notable for the following components:   BUN 22 (*)    All other components within normal limits  SEDIMENTATION RATE - Abnormal; Notable for the following components:   Sed Rate 25 (*)    All other components within normal limits  LACTIC ACID, PLASMA  C-REACTIVE PROTEIN    EKG None  Radiology No results found.  Procedures Procedures    Medications Ordered in ED Medications  morphine 4 MG/ML injection 4 mg (4 mg Intravenous Not Given 10/24/21 2240)  morphine 4 MG/ML injection 4 mg (4 mg Intravenous Given 10/24/21 1332)  LORazepam (ATIVAN) injection 1 mg (1 mg Intravenous Given 10/24/21 2103)  diphenhydrAMINE (BENADRYL) capsule 25 mg (25 mg Oral Given 10/24/21 1703)  methylPREDNISolone sodium succinate (SOLU-MEDROL) 125 mg/2 mL injection 125 mg (125 mg Intravenous Given 10/24/21 2005)  famotidine (PEPCID) IVPB 20 mg premix (0 mg Intravenous Stopped 10/24/21 2103)  gadobutrol (GADAVIST) 1 MMOL/ML injection 10 mL (10 mLs Intravenous Contrast Given  10/24/21 2133)    ED Course/ Medical Decision Making/ A&P Clinical Course as of 10/24/21 2243  Mon Oct 24, 2021  1434 Discussed with Dr. Regenia Skeeter for ED to ED transfer for MRI lumbar spine who accepts transfer. MRI lumbar spine order placed [CA]    Clinical Course User Index [CA] Suzy Bouchard, PA-C                           Medical Decision Making Amount and/or Complexity of Data Reviewed Labs: ordered. Radiology: ordered.  Risk Prescription drug management.   51 year old female presents emergency department as a transfer from our stand-alone ER for MRI of the lower back.  Previous history of septic arthritis of the lumbar spine with possible abscess, resolved after inpatient treatment, cleared from a  neurosurgery and ID standpoint.  Still being followed for the endocarditis with planned surgery for valve replacement with Dr. Kipp Brood.  Pain worsening over the past 2 to 3 days, radicular symptoms in the right lower extremity.  No saddle anesthesia, no acute weakness on neuro exam, no incontinence.  Afebrile, no infectious symptoms.  Vitals are stable on arrival, she is neuro intact but tenderness to palpation in the lumbar spine.  We are pending MRI for further evaluation.        Final Clinical Impression(s) / ED Diagnoses Final diagnoses:  Acute midline low back pain with right-sided sciatica    Rx / DC Orders ED Discharge Orders     None         Lorelle Gibbs, DO 10/24/21 2259

## 2021-10-24 NOTE — ED Provider Notes (Signed)
[facility] EMERGENCY DEPT Provider Note   CSN: [id number] Arrival date & time: 10/24/21  1112     History  Chief Complaint  Patient presents with   Back Pain    Mary Chambers is a 51 y.o. female with a past medical history significant for hypertension, nephrolithiasis, morbid obesity, and prior lumbar surgery in 2020 who presents to the ED due to low back pain that radiates down right lower extremity x3 days.  Patient states pain has progressively worsened.  Pain associated with numbness/tingling and weakness of the right lower extremity.  Patient states she has been having to use her cane over the weekend which is atypical for her. No recent low back injury. Denies fever and chills.  Denies IV drug use.  No saddle paresthesias.  Patient has a history of previous septic arthritis of lumbar spine with possible abscess and endocarditis in September 2022. She has been taking tylenol with no relief in symptoms.      Home Medications Prior to Admission medications   Medication Sig Start Date End Date Taking? Authorizing Provider  Ascorbic Acid (VITAMIN C) 500 MG CAPS Take 500 mg by mouth daily.    [provider]  atorvastatin (LIPITOR) 10 MG tablet Take 10 mg by mouth daily. 04/27/21   [provider]  cholecalciferol (VITAMIN D3) 25 MCG (1000 UT) tablet Take 1,000 Units by mouth daily.    [provider]  enalapril (VASOTEC) 10 MG tablet Take 10 mg by mouth daily.    [provider]  fluticasone (FLONASE) 50 MCG/ACT nasal spray Place 2 sprays into both nostrils daily. Patient taking differently: Place 2 sprays into both nostrils daily as needed for allergies. 09/04/21   Evelina Dun A, FNP  furosemide (LASIX) 20 MG tablet Take 20 mg by mouth daily. 05/31/21   [provider]  loperamide (IMODIUM) 2 MG capsule Take 1 capsule (2 mg total) by mouth as needed for diarrhea or loose stools. 07/01/21   Nita Sells, MD   Multiple Vitamin (MULTIVITAMIN) capsule Take 1 capsule by mouth daily.     [provider]  naproxen (NAPROSYN) 500 MG tablet Take 1 tablet (500 mg total) by mouth 2 (two) times daily with a meal. 08/18/21   Brunetta Jeans, PA-C  pantoprazole (PROTONIX) 40 MG tablet Take 40 mg by mouth daily. 04/22/21   [provider]  Potassium (POTASSIMIN PO) Take 500 mg by mouth daily. 250 per tab    [provider]  tiZANidine (ZANAFLEX) 4 MG tablet Take 4 mg by mouth at bedtime as needed for muscle spasms.    [provider]  traMADol (ULTRAM) 50 MG tablet Take 50 mg by mouth every 6 (six) hours as needed for severe pain. 08/29/21   [provider]      Allergies    Baclofen, Doxycycline, Percocet [oxycodone-acetaminophen], Septra [sulfamethoxazole-trimethoprim], and Gabapentin    Review of Systems   Review of Systems  Constitutional:  Negative for chills and fever.  Musculoskeletal:  Positive for back pain.  Neurological:  Positive for weakness and numbness.  All other systems reviewed and are negative.  Physical Exam Updated Vital Signs BP 133/62    Pulse 79    Temp 98.8 F (37.1 C)    Resp 18    Ht _0  (1.702 m)    Wt 115.2 kg    SpO2 100%    BMI 39.78 kg/m  Physical Exam Vitals and nursing note reviewed.  Constitutional:  General: She is not in acute distress.    Appearance: She is not ill-appearing.  HENT:     Head: Normocephalic.  Eyes:     Pupils: Pupils are equal, round, and reactive to light.  Cardiovascular:     Rate and Rhythm: Normal rate and regular rhythm.     Pulses: Normal pulses.     Heart sounds: Normal heart sounds. No murmur heard.   No friction rub. No gallop.  Pulmonary:     Effort: Pulmonary effort is normal.     Breath sounds: Normal breath sounds.  Abdominal:     General: Abdomen is flat. There is no distension.     Palpations: Abdomen is soft.     Tenderness: There is no abdominal tenderness. There is no  guarding or rebound.  Musculoskeletal:        General: Normal range of motion.     Cervical back: Neck supple.     Comments: Midline lumbar tenderness. No thoracic midline tenderness. Bilateral lower extremities neurovascularly intact.   Skin:    General: Skin is warm and dry.  Neurological:     General: No focal deficit present.     Mental Status: She is alert.  Psychiatric:        Mood and Affect: Mood normal.        Behavior: Behavior normal.    ED Results / Procedures / Treatments   Labs (all labs ordered are listed, but only abnormal results are displayed) Labs Reviewed  CBC WITH DIFFERENTIAL/PLATELET - Abnormal; Notable for the following components:      Result Value   MCH 25.7 (*)    All other components within normal limits  BASIC METABOLIC PANEL - Abnormal; Notable for the following components:   BUN 22 (*)    All other components within normal limits  SEDIMENTATION RATE - Abnormal; Notable for the following components:   Sed Rate 25 (*)    All other components within normal limits  LACTIC ACID, PLASMA  C-REACTIVE PROTEIN    EKG None  Radiology No results found.  Procedures Procedures    Medications Ordered in ED Medications  morphine 4 MG/ML injection 4 mg (4 mg Intravenous Given 10/24/21 1332)    ED Course/ Medical Decision Making/ A&P Clinical Course as of 10/24/21 1505  Mon Oct 24, 2021  1434 Discussed with Dr. Regenia Skeeter for ED to ED transfer for MRI lumbar spine who accepts transfer. MRI lumbar spine order placed [CA]    Clinical Course User Index [CA] Suzy Bouchard, PA-C                           Medical Decision Making Amount and/or Complexity of Data Reviewed External Data Reviewed: radiology and notes. Labs: ordered. Decision-making details documented in ED Course. Radiology: ordered.  Risk Prescription drug management.   51 year old female presents to the ED due to low back pain that radiates down right lower extremity x3 days.   Previous lumbar surgery in 2020.  Patient admitted in September 2022 due to lumbar septic arthritis and endocarditis.  No IV drug use.  Denies recent injury.  Upon arrival, patient afebrile, not tachycardic or hypoxic.  Patient in no acute distress.  Physical exam significant for lumbar midline tenderness.  Bilateral lower extremities neurovascularly intact.  Patient has some difficulties raising right lower extremity from bed however, still able to lift it slightly.  Routine labs ordered to rule out infectious etiology.  Patient  will need MRI lumbar spine to rule out septic arthritis given previous history. Morphine given. Low suspicion for cauda equina.  This patient presents to the ED for concern of low back pain this involves an extensive number of treatment options, and is a complaint that carries with it a high risk of complications and morbidity.  The differential diagnosis includes lumbar radiculopathy, infectious etiology, cauda equina  CBC reassuring.  No leukocytosis.  Normal hemoglobin.  BMP reassuring.  No major electrolyte derangements.  Lactic acid normal.  ESR mildly elevated at 25.  Discussed with Dr. Regenia Skeeter who accepts ED to ED transfer for MRI lumbar spine.  If MRI is normal, suspect lumbar radiculopathy and patient may be discharged home with pain medication and follow-up with her neurosurgeon.         Final Clinical Impression(s) / ED Diagnoses Final diagnoses:  Acute midline low back pain with right-sided sciatica    Rx / DC Orders ED Discharge Orders     None         Suzy Bouchard, PA-C 10/24/21 [place], DO 10/25/21 0901

## 2021-10-24 NOTE — ED Provider Notes (Signed)
Patient was sent from [facility] for an MRI.  She was given Dilaudid in route to the hospital and now is complaining of itchiness and hives.  Per chart review she has a allergy to Percocet.  Given Benadryl in triage   5:30: Patient continues to complain of itchiness, with erythema and some developing urticaria to upper extremities and neck.  Airway clear, tolerating secretions.  Triage nurse notified the patient needs a room, or at least the hallway to be further observed for potential development of anaphylaxis.   Darliss Ridgel 10/24/21 [place], [place], MD 10/24/21 2001

## 2021-10-25 MED ORDER — HYDROCODONE-ACETAMINOPHEN 5-325 MG PO TABS: 1.0000 | ORAL_TABLET | Freq: Two times a day (BID) | ORAL | 0 refills | Status: DC | PRN

## 2021-10-25 MED ORDER — METHYLPREDNISOLONE 4 MG PO TBPK: ORAL_TABLET | ORAL | 0 refills | Status: DC

## 2021-10-25 MED ORDER — DEXAMETHASONE SODIUM PHOSPHATE 10 MG/ML IJ SOLN
10.0000 mg | Freq: Once | INTRAMUSCULAR | Status: AC
Start: 2021-10-25 — End: 2021-10-25
  Administered 2021-10-25: 10 mg via INTRAVENOUS
  Filled 2021-10-25: qty 1

## 2021-10-25 MED ORDER — DIPHENHYDRAMINE HCL 25 MG PO CAPS
25.0000 mg | ORAL_CAPSULE | Freq: Once | ORAL | Status: AC
  Administered 2021-10-25: 25 mg via ORAL
  Filled 2021-10-25: qty 1

## 2021-10-25 NOTE — Discharge Instructions (Signed)
You are seen in the ER for back pain.  The MRI reveals that you have a synovial cyst, from infection perspective it looks fairly good.  Prescribed is the Medrol dose pack at the recommendation of neurosurgery team.  Please take the pain medication only if the pain is excruciating.  Do not take tramadol and the pain medication prescribed at the same time.  Follow-up with Dr. Vertell Limber as planned on Monday.

## 2021-10-26 ENCOUNTER — Ambulatory Visit (HOSPITAL_BASED_OUTPATIENT_CLINIC_OR_DEPARTMENT_OTHER): Payer: Self-pay | Admitting: Cardiology

## 2021-10-27 MED FILL — Hydromorphone HCl Preservative Free (PF) Inj 2 MG/ML: INTRAMUSCULAR | Qty: 0.5 | Status: AC

## 2021-11-02 ENCOUNTER — Telehealth: Payer: Self-pay

## 2021-11-02 NOTE — Telephone Encounter (Signed)
STD/Attending Physician's Statement completed and faxed to [facility] [phone]. Beginning LOA 11/07/21 through approx 02/06/22. Form mailed to pt's home address.

## 2021-11-03 ENCOUNTER — Encounter: Payer: Self-pay | Admitting: Podiatry

## 2021-11-03 ENCOUNTER — Ambulatory Visit (INDEPENDENT_AMBULATORY_CARE_PROVIDER_SITE_OTHER): Payer: Medicaid Other | Admitting: Podiatry

## 2021-11-03 ENCOUNTER — Ambulatory Visit (INDEPENDENT_AMBULATORY_CARE_PROVIDER_SITE_OTHER): Payer: Medicaid Other

## 2021-11-03 ENCOUNTER — Other Ambulatory Visit: Payer: Self-pay

## 2021-11-03 ENCOUNTER — Other Ambulatory Visit (HOSPITAL_COMMUNITY): Payer: 59

## 2021-11-03 DIAGNOSIS — M7752 Other enthesopathy of left foot: Secondary | ICD-10-CM | POA: Diagnosis not present

## 2021-11-03 DIAGNOSIS — M2041 Other hammer toe(s) (acquired), right foot: Secondary | ICD-10-CM | POA: Diagnosis not present

## 2021-11-03 DIAGNOSIS — M779 Enthesopathy, unspecified: Secondary | ICD-10-CM

## 2021-11-03 DIAGNOSIS — M7751 Other enthesopathy of right foot: Secondary | ICD-10-CM

## 2021-11-03 MED ORDER — TRIAMCINOLONE ACETONIDE 10 MG/ML IJ SUSP
20.0000 mg | Freq: Once | INTRAMUSCULAR | Status: AC
  Administered 2021-11-03: 20 mg

## 2021-11-03 NOTE — Pre-Procedure Instructions (Signed)
Surgical Instructions    Your procedure is scheduled on Monday 11/07/21.   Report to [facility] Main Entrance "A" at 05:30 A.M., then check in with the Admitting office.  Call this number if you have problems the morning of surgery:  [phone]   If you have any questions prior to your surgery date call [phone]: Open Monday-Friday 8am-4pm    Remember:  Do not eat or drink after midnight the night before your surgery     Take these medicines the morning of surgery with A SIP OF WATER:   atorvastatin (LIPITOR)  pantoprazole (PROTONIX)   Potassium    Take these medicines if needed:   fluticasone (FLONASE)  traMADol (ULTRAM)   As of today, STOP taking any Aspirin (unless otherwise instructed by your surgeon) Aleve, Naproxen, Ibuprofen, Motrin, Advil, Goody's, BC's, all herbal medications, fish oil, and all vitamins.  After your COVID test   You are not required to quarantine however you are required to wear a well-fitting mask when you are out and around people not in your household.  If your mask becomes wet or soiled, replace with a new one.  Wash your hands often with soap and water for 20 seconds or clean your hands with an alcohol-based hand sanitizer that contains at least 60% alcohol.  Do not share personal items.  Notify your provider: if you are in close contact with someone who has COVID  or if you develop a fever of 100.4 or greater, sneezing, cough, sore throat, shortness of breath or body aches.           Do not wear jewelry or makeup Do not wear lotions, powders, perfumes/colognes, or deodorant. Do not shave 48 hours prior to surgery.  Men may shave face and neck. Do not bring valuables to the hospital. Do not wear nail polish, gel polish, artificial nails, or any other type of covering on natural nails (fingers and toes) If you have artificial nails or gel coating that need to be removed by a nail salon, please have this removed prior to surgery.  Artificial nails or gel coating may interfere with anesthesia's ability to adequately monitor your vital signs.             [facility] is not responsible for any belongings or valuables.  Do NOT Smoke (Tobacco/Vaping)  24 hours prior to your procedure  If you use a CPAP at night, you may bring your mask for your overnight stay.   Contacts, glasses, hearing aids, dentures or partials may not be worn into surgery, please bring cases for these belongings   For patients admitted to the hospital, discharge time will be determined by your treatment team.   Patients discharged the day of surgery will not be allowed to drive home, and someone needs to stay with them for 24 hours.  NO VISITORS WILL BE ALLOWED IN PRE-OP WHERE PATIENTS ARE PREPPED FOR SURGERY.  ONLY 1 SUPPORT PERSON MAY BE PRESENT IN THE WAITING ROOM WHILE YOU ARE IN SURGERY.  IF YOU ARE TO BE ADMITTED, ONCE YOU ARE IN YOUR ROOM YOU WILL BE ALLOWED TWO (2) VISITORS. 1 (ONE) VISITOR MAY STAY OVERNIGHT BUT MUST ARRIVE TO THE ROOM BY 8pm.  Minor children may have two parents present. Special consideration for safety and communication needs will be reviewed on a case by case basis.  Special instructions:    Oral Hygiene is also important to reduce your risk of infection.  Remember - BRUSH YOUR TEETH THE  MORNING OF SURGERY WITH YOUR REGULAR TOOTHPASTE   [facility]- Preparing For Surgery  Before surgery, you can play an important role. Because skin is not sterile, your skin needs to be as free of germs as possible. You can reduce the number of germs on your skin by washing with CHG (chlorahexidine gluconate) Soap before surgery.  CHG is an antiseptic cleaner which kills germs and bonds with the skin to continue killing germs even after washing.     Please do not use if you have an allergy to CHG or antibacterial soaps. If your skin becomes reddened/irritated stop using the CHG.  Do not shave (including legs and underarms) for at least 48  hours prior to first CHG shower. It is OK to shave your face.  Please follow these instructions carefully.     Shower the NIGHT BEFORE SURGERY and the MORNING OF SURGERY with CHG Soap.   If you chose to wash your hair, wash your hair first as usual with your normal shampoo. After you shampoo, rinse your hair and body thoroughly to remove the shampoo.  Then [employer] and genitals (private parts) with your normal soap and rinse thoroughly to remove soap.  After that Use CHG Soap as you would any other liquid soap. You can apply CHG directly to the skin and wash gently with a scrungie or a clean washcloth.   Apply the CHG Soap to your body ONLY FROM THE NECK DOWN.  Do not use on open wounds or open sores. Avoid contact with your eyes, ears, mouth and genitals (private parts). Wash Face and genitals (private parts)  with your normal soap.   Wash thoroughly, paying special attention to the area where your surgery will be performed.  Thoroughly rinse your body with warm water from the neck down.  DO NOT shower/wash with your normal soap after using and rinsing off the CHG Soap.  Pat yourself dry with a CLEAN TOWEL.  Wear CLEAN PAJAMAS to bed the night before surgery  Place CLEAN SHEETS on your bed the night before your surgery  DO NOT SLEEP WITH PETS.   Day of Surgery:  Take a shower with CHG soap. Wear Clean/Comfortable clothing the morning of surgery Do not apply any deodorants/lotions.   Remember to brush your teeth WITH YOUR REGULAR TOOTHPASTE.   Please read over the following fact sheets that you were given.

## 2021-11-04 ENCOUNTER — Encounter (HOSPITAL_COMMUNITY): Payer: Self-pay

## 2021-11-04 ENCOUNTER — Ambulatory Visit (HOSPITAL_BASED_OUTPATIENT_CLINIC_OR_DEPARTMENT_OTHER)
Admission: RE | Admit: 2021-11-04 | Discharge: 2021-11-04 | Disposition: A | Discharge status: Home / Self Care | Payer: 59 | Source: Ambulatory Visit | Attending: Thoracic Surgery (Cardiothoracic Vascular Surgery) | Admitting: Thoracic Surgery (Cardiothoracic Vascular Surgery)

## 2021-11-04 ENCOUNTER — Encounter (HOSPITAL_COMMUNITY)
Admission: RE | Admit: 2021-11-04 | Discharge: 2021-11-04 | Disposition: A | Discharge status: Home / Self Care | Payer: 59 | Source: Ambulatory Visit | Attending: Thoracic Surgery (Cardiothoracic Vascular Surgery) | Admitting: Thoracic Surgery (Cardiothoracic Vascular Surgery)

## 2021-11-04 ENCOUNTER — Ambulatory Visit (HOSPITAL_COMMUNITY)
Admission: RE | Admit: 2021-11-04 | Discharge: 2021-11-04 | Disposition: A | Discharge status: Home / Self Care | Payer: 59 | Source: Ambulatory Visit | Attending: Thoracic Surgery (Cardiothoracic Vascular Surgery) | Admitting: Thoracic Surgery (Cardiothoracic Vascular Surgery)

## 2021-11-04 ENCOUNTER — Other Ambulatory Visit: Payer: Self-pay

## 2021-11-04 VITALS — BP 140/72 | HR 80 | Temp 98.3°F | Resp 17 | Ht 67.0 in | Wt 264.5 lb

## 2021-11-04 DIAGNOSIS — I352 Nonrheumatic aortic (valve) stenosis with insufficiency: Secondary | ICD-10-CM | POA: Insufficient documentation

## 2021-11-04 DIAGNOSIS — Z01818 Encounter for other preprocedural examination: Secondary | ICD-10-CM

## 2021-11-04 DIAGNOSIS — I350 Nonrheumatic aortic (valve) stenosis: Secondary | ICD-10-CM

## 2021-11-04 DIAGNOSIS — I119 Hypertensive heart disease without heart failure: Secondary | ICD-10-CM | POA: Insufficient documentation

## 2021-11-04 DIAGNOSIS — I351 Nonrheumatic aortic (valve) insufficiency: Secondary | ICD-10-CM

## 2021-11-04 DIAGNOSIS — Z20822 Contact with and (suspected) exposure to covid-19: Secondary | ICD-10-CM | POA: Insufficient documentation

## 2021-11-04 DIAGNOSIS — Z6841 Body Mass Index (BMI) 40.0 and over, adult: Secondary | ICD-10-CM | POA: Insufficient documentation

## 2021-11-04 HISTORY — DX: Nonrheumatic aortic (valve) insufficiency: I35.1

## 2021-11-04 LAB — HEMOGLOBIN A1C
Hgb A1c MFr Bld: 5.7 % — ABNORMAL HIGH (ref 4.8–5.6)
Mean Plasma Glucose: 116.89 mg/dL

## 2021-11-04 LAB — BLOOD GAS, ARTERIAL
Acid-Base Excess: 0.9 mmol/L (ref 0.0–2.0)
Bicarbonate: 24.8 mmol/L (ref 20.0–28.0)
Drawn by: 60286
FIO2: 21
O2 Saturation: 98.5 %
Patient temperature: 37
pCO2 arterial: 38 mmHg (ref 32.0–48.0)
pH, Arterial: 7.43 (ref 7.350–7.450)
pO2, Arterial: 105 mmHg (ref 83.0–108.0)

## 2021-11-04 LAB — ECHOCARDIOGRAM COMPLETE
AR max vel: 1.33 cm2
AV Area VTI: 1.37 cm2
AV Area mean vel: 1.39 cm2
AV Mean grad: 27 mmHg
AV Peak grad: 42.5 mmHg
Ao pk vel: 3.26 m/s
Area-P 1/2: 3.42 cm2
P 1/2 time: 341 msec
S' Lateral: 3.6 cm

## 2021-11-04 LAB — COMPREHENSIVE METABOLIC PANEL
ALT: 9 U/L (ref 0–44)
AST: 29 U/L (ref 15–41)
Albumin: 3.6 g/dL (ref 3.5–5.0)
Alkaline Phosphatase: 59 U/L (ref 38–126)
Anion gap: 10 (ref 5–15)
BUN: 22 mg/dL — ABNORMAL HIGH (ref 6–20)
CO2: 20 mmol/L — ABNORMAL LOW (ref 22–32)
Calcium: 8.8 mg/dL — ABNORMAL LOW (ref 8.9–10.3)
Chloride: 107 mmol/L (ref 98–111)
Creatinine, Ser: 0.99 mg/dL (ref 0.44–1.00)
GFR, Estimated: >60 mL/min (ref >60)
Glucose, Bld: 94 mg/dL (ref 70–99)
Potassium: 5.1 mmol/L (ref 3.5–5.1)
Sodium: 137 mmol/L (ref 135–145)
Total Bilirubin: 1.6 mg/dL — ABNORMAL HIGH (ref 0.3–1.2)
Total Protein: 6.2 g/dL — ABNORMAL LOW (ref 6.5–8.1)

## 2021-11-04 LAB — CBC
HCT: 39 % (ref 36.0–46.0)
Hemoglobin: 12.2 g/dL (ref 12.0–15.0)
MCH: 26.1 pg (ref 26.0–34.0)
MCHC: 31.3 g/dL (ref 30.0–36.0)
MCV: 83.3 fL (ref 80.0–100.0)
Platelets: 303 10*3/uL (ref 150–400)
RBC: 4.68 MIL/uL (ref 3.87–5.11)
RDW: 14.8 % (ref 11.5–15.5)
WBC: 10.6 10*3/uL — ABNORMAL HIGH (ref 4.0–10.5)
nRBC: 0 % (ref 0.0–0.2)

## 2021-11-04 LAB — PROTIME-INR
INR: 0.9 (ref 0.8–1.2)
Prothrombin Time: 12.4 seconds (ref 11.4–15.2)

## 2021-11-04 LAB — URINALYSIS, ROUTINE W REFLEX MICROSCOPIC
Bilirubin Urine: NEGATIVE
Glucose, UA: NEGATIVE mg/dL
Hgb urine dipstick: NEGATIVE
Ketones, ur: NEGATIVE mg/dL
Leukocytes,Ua: NEGATIVE
Nitrite: NEGATIVE
Protein, ur: NEGATIVE mg/dL
Specific Gravity, Urine: 1.01 (ref 1.005–1.030)
pH: 6 (ref 5.0–8.0)

## 2021-11-04 LAB — TYPE AND SCREEN
ABO/RH(D): O POS
Antibody Screen: NEGATIVE

## 2021-11-04 LAB — SARS CORONAVIRUS 2 (TAT 6-24 HRS): SARS Coronavirus 2: NEGATIVE

## 2021-11-04 LAB — SURGICAL PCR SCREEN
MRSA, PCR: NEGATIVE
Staphylococcus aureus: NEGATIVE

## 2021-11-04 LAB — APTT: aPTT: 22 seconds — ABNORMAL LOW (ref 24–36)

## 2021-11-04 IMAGING — DX DG CHEST 2V
2 series · 2 of 2 positions shown · non-contrast
Comparison: [DATE]

CLINICAL DATA: Preop evaluation for upcoming aortic valve surgery,
initial encounter

EXAM:
CHEST - 2 VIEW

[w chest pa]
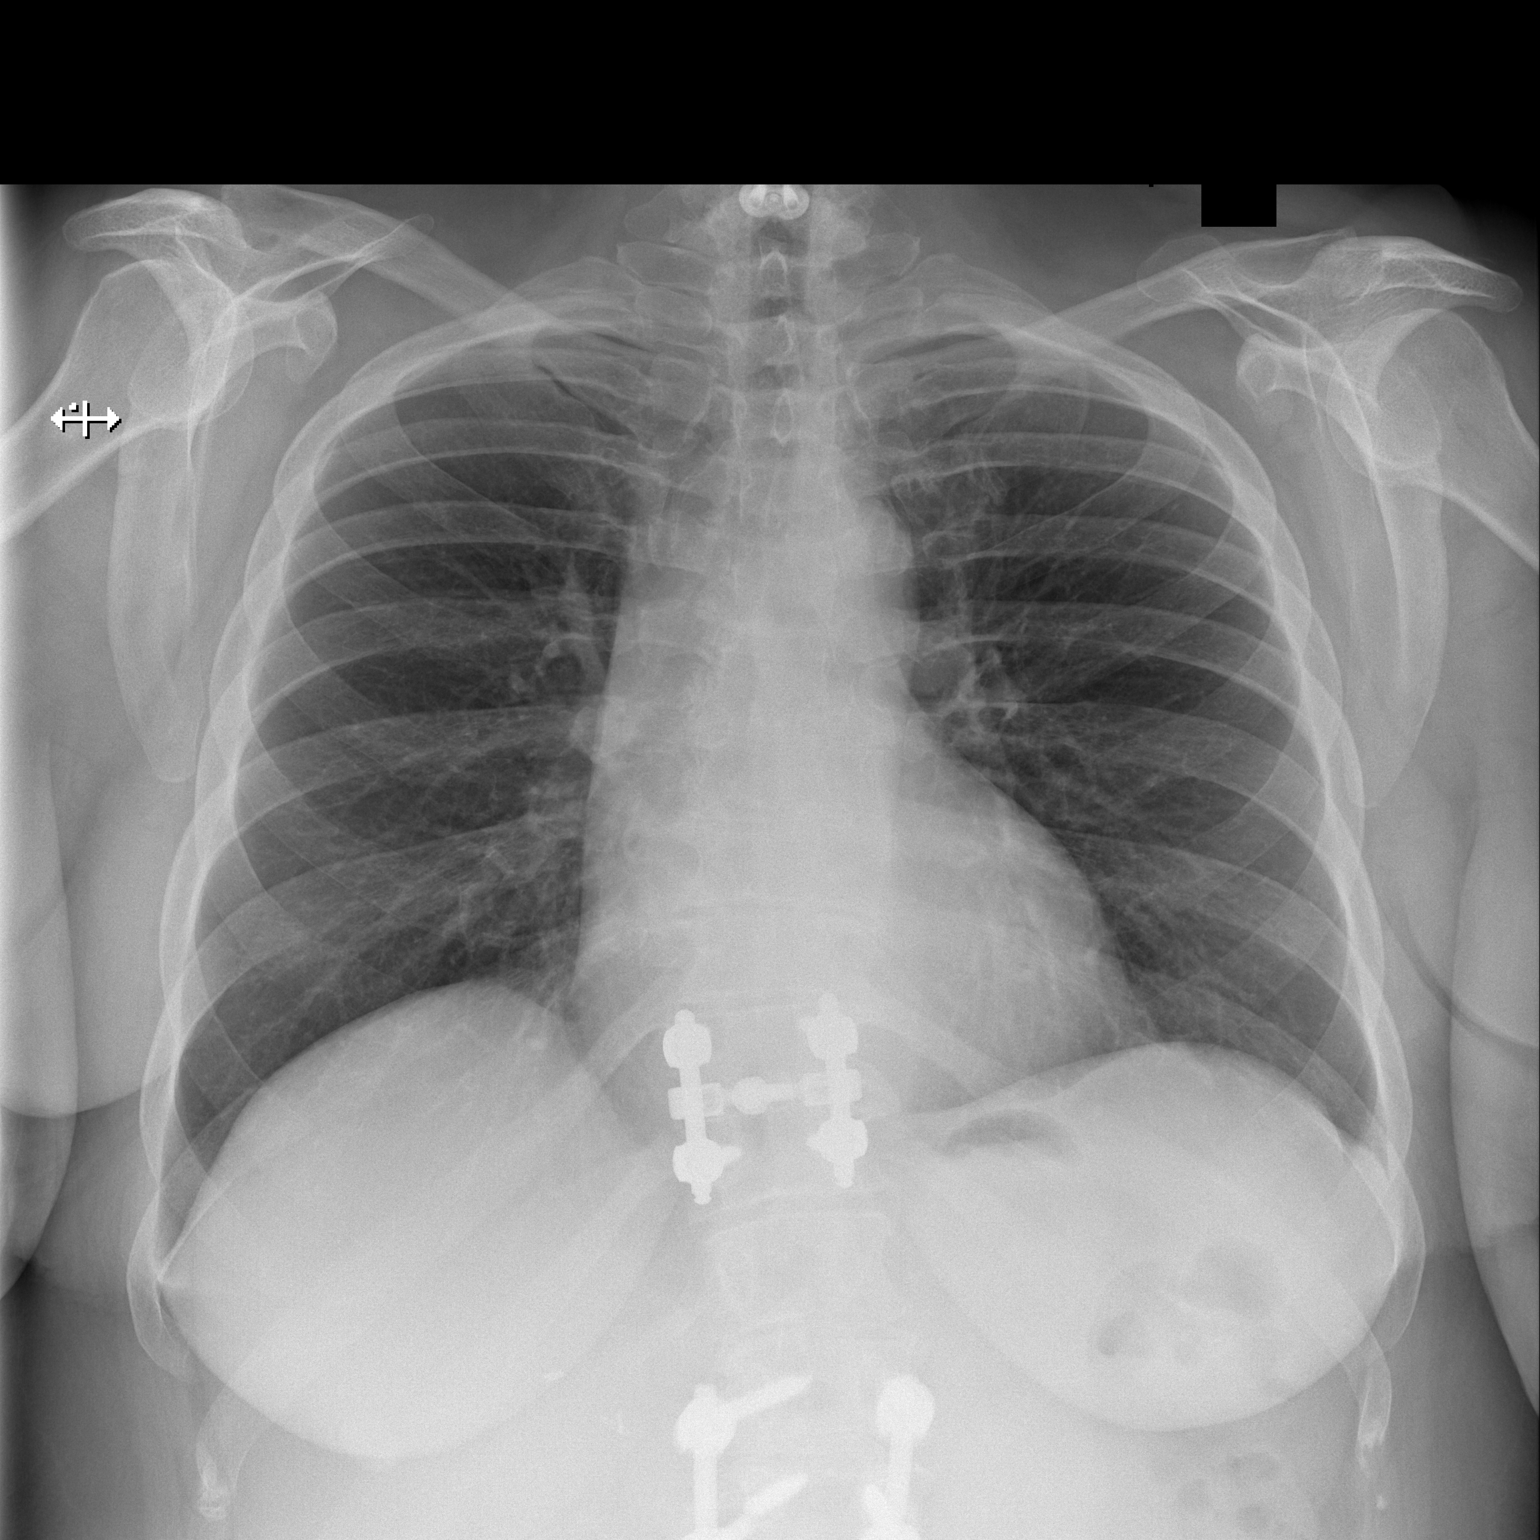

[w chest lat]
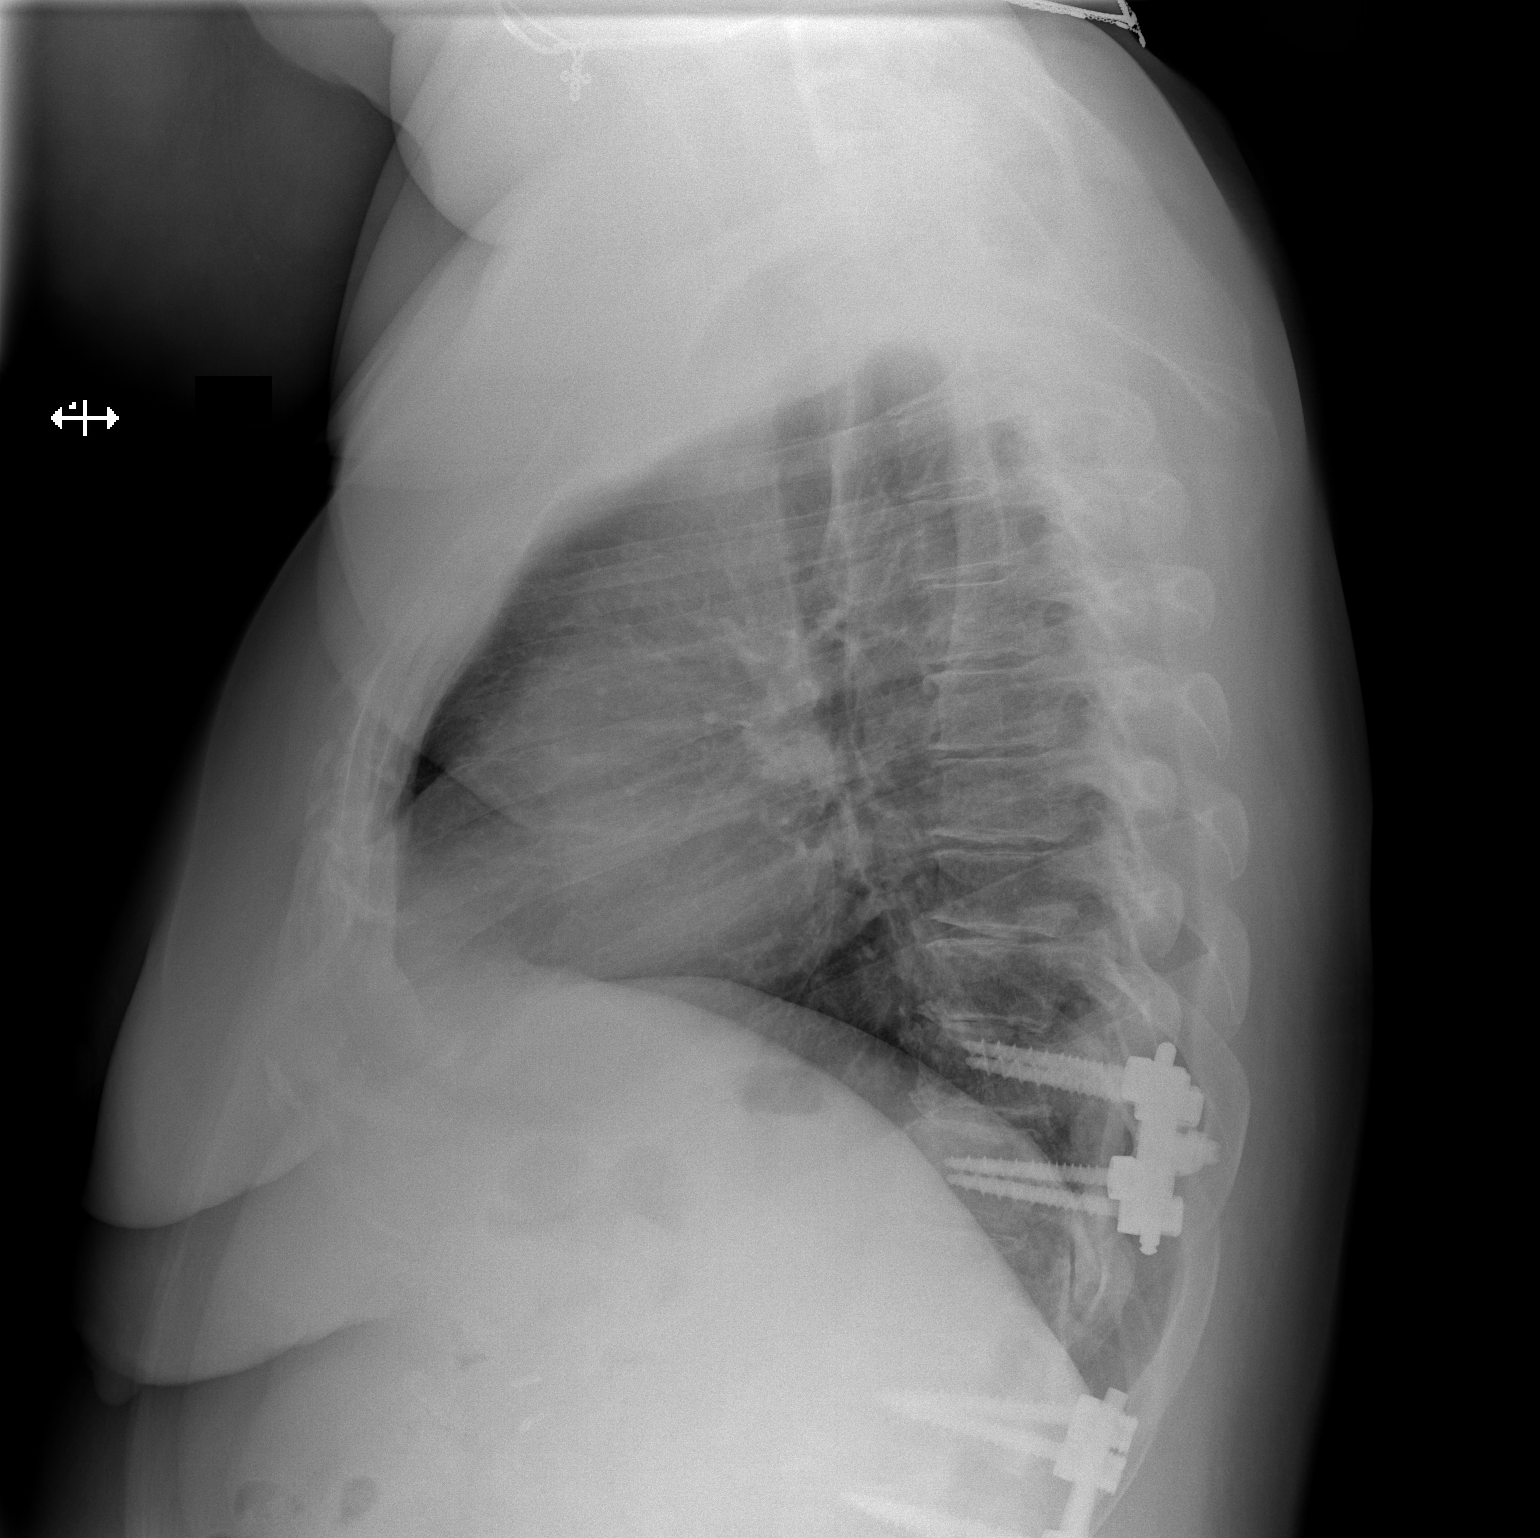

[2 of 2 positions shown; findings below may reference images not displayed]

FINDINGS: Cardiac shadow is stable. Postsurgical changes in the thoracic and
lumbar spine are again seen. Right-sided PICC has been removed in
the interval. Lungs are clear bilaterally. No focal infiltrate or
effusion is seen. No acute bony abnormality is noted.
IMPRESSION: No acute abnormality noted.

## 2021-11-04 MED ORDER — CEFAZOLIN SODIUM-DEXTROSE 2-4 GM/100ML-% IV SOLN
2.0000 g | INTRAVENOUS | Status: DC
  Filled 2021-11-04: qty 100

## 2021-11-04 MED ORDER — HEPARIN 30,000 UNITS/1000 ML (OHS) CELLSAVER SOLUTION
Status: DC
  Filled 2021-11-04: qty 1000

## 2021-11-04 MED ORDER — TRANEXAMIC ACID (OHS) PUMP PRIME SOLUTION
2.0000 mg/kg | INTRAVENOUS | Status: DC
  Filled 2021-11-04: qty 1.66

## 2021-11-04 MED ORDER — MILRINONE LACTATE IN DEXTROSE 20-5 MG/100ML-% IV SOLN
0.3000 ug/kg/min | INTRAVENOUS | Status: DC
  Filled 2021-11-04: qty 100

## 2021-11-04 MED ORDER — PLASMA-LYTE A IV SOLN
INTRAVENOUS | Status: DC
  Filled 2021-11-04: qty 2.5

## 2021-11-04 MED ORDER — VANCOMYCIN HCL 1500 MG/300ML IV SOLN
1500.0000 mg | INTRAVENOUS | Status: AC
  Administered 2021-11-07: 1500 mg via INTRAVENOUS
  Filled 2021-11-04: qty 300

## 2021-11-04 MED ORDER — DEXMEDETOMIDINE HCL IN NACL 400 MCG/100ML IV SOLN
0.1000 ug/kg/h | INTRAVENOUS | Status: AC
  Administered 2021-11-07: .5 ug/kg/h via INTRAVENOUS
  Filled 2021-11-04: qty 100

## 2021-11-04 MED ORDER — MANNITOL 20 % IV SOLN
INTRAVENOUS | Status: DC
  Filled 2021-11-04: qty 13

## 2021-11-04 MED ORDER — TRANEXAMIC ACID (OHS) BOLUS VIA INFUSION
15.0000 mg/kg | INTRAVENOUS | Status: AC
  Administered 2021-11-07: 924 mg via INTRAVENOUS
  Filled 2021-11-04: qty 924

## 2021-11-04 MED ORDER — TRANEXAMIC ACID 1000 MG/10ML IV SOLN
1.5000 mg/kg/h | INTRAVENOUS | Status: AC
  Administered 2021-11-07 (×2): 1.5 mg/kg/h via INTRAVENOUS
  Filled 2021-11-04: qty 25

## 2021-11-04 MED ORDER — EPINEPHRINE HCL 5 MG/250ML IV SOLN IN NS
0.0000 ug/min | INTRAVENOUS | Status: DC
  Filled 2021-11-04: qty 250

## 2021-11-04 MED ORDER — CEFAZOLIN IN SODIUM CHLORIDE 3-0.9 GM/100ML-% IV SOLN
3.0000 g | INTRAVENOUS | Status: DC
  Filled 2021-11-04 (×2): qty 100

## 2021-11-04 MED ORDER — CEFAZOLIN SODIUM-DEXTROSE 2-4 GM/100ML-% IV SOLN
2.0000 g | INTRAVENOUS | Status: AC
  Administered 2021-11-07 (×2): 2 g via INTRAVENOUS
  Filled 2021-11-04: qty 100

## 2021-11-04 MED ORDER — POTASSIUM CHLORIDE 2 MEQ/ML IV SOLN
80.0000 meq | INTRAVENOUS | Status: DC
  Filled 2021-11-04: qty 40

## 2021-11-04 MED ORDER — NITROGLYCERIN IN D5W 200-5 MCG/ML-% IV SOLN
2.0000 ug/min | INTRAVENOUS | Status: DC
  Filled 2021-11-04: qty 250

## 2021-11-04 MED ORDER — NOREPINEPHRINE 4 MG/250ML-% IV SOLN
0.0000 ug/min | INTRAVENOUS | Status: AC
  Administered 2021-11-07: 2 ug/min via INTRAVENOUS
  Filled 2021-11-04: qty 250

## 2021-11-04 MED ORDER — PHENYLEPHRINE HCL-NACL 20-0.9 MG/250ML-% IV SOLN
30.0000 ug/min | INTRAVENOUS | Status: AC
  Administered 2021-11-07: 25 ug/min via INTRAVENOUS
  Filled 2021-11-04: qty 250

## 2021-11-04 MED ORDER — INSULIN REGULAR(HUMAN) IN NACL 100-0.9 UT/100ML-% IV SOLN
INTRAVENOUS | Status: AC
  Administered 2021-11-07: 1.4 [IU]/h via INTRAVENOUS
  Filled 2021-11-04: qty 100

## 2021-11-04 NOTE — Progress Notes (Signed)
Anesthesia Chart Review:  Case: [id number] Date/Time: 11/07/21 0715   Procedures:      AORTIC VALVE REPLACEMENT (AVR) (Chest)     TRANSESOPHAGEAL ECHOCARDIOGRAM (TEE)   Anesthesia type: General   Pre-op diagnosis: SEVERE AI   [facility] ROOM [facility] / [facility]   Surgeons: Mary Matte, Mary Chambers       DISCUSSION: Patient is a 51 year old female scheduled for the above procedure.  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  BMI is consistent with morbid obesity.  11/04/2021 presurgical COVID-19 test negative.  11/04/2021 chest x-ray is still in process.  Anesthesia team to evaluate on the day of surgery.   VS: BP 140/72    Pulse 80    Temp 36.8 C (Oral)    Resp 17    Ht 5\' 7"  (1.702 m)    Wt 120 kg    SpO2 97%    BMI 41.43 kg/m   PROVIDERS: [name], NP is PCP  [name], Mary Chambers is cardiologist [name], Mary Chambers is neurosurgeon [name], Mary Chambers is ID   LABS: Labs reviewed: Acceptable for surgery. (all labs ordered are listed, but only abnormal results are displayed)  Labs Reviewed  CBC - Abnormal; Notable for the following components:      Result Value   WBC 10.6 (*)    All other components within normal limits  COMPREHENSIVE METABOLIC PANEL - Abnormal; Notable for the following components:   CO2 20 (*)    BUN 22 (*)    Calcium 8.8 (*)    Total Protein 6.2 (*)    Total Bilirubin 1.6 (*)    All other components within normal limits  APTT - Abnormal; Notable for the following components:   aPTT 22 (*)    All other components within normal limits  BLOOD GAS, ARTERIAL - Abnormal; Notable for the following components:   Allens test (pass/fail) BRACHIAL ARTERY (*)     All other components within normal limits  HEMOGLOBIN A1C - Abnormal; Notable for the following components:   Hgb A1c MFr Bld 5.7 (*)    All other components within normal limits  SURGICAL PCR SCREEN  SARS CORONAVIRUS 2 (TAT 6-24 HRS)  PROTIME-INR  URINALYSIS, ROUTINE W REFLEX MICROSCOPIC  TYPE AND SCREEN    IMAGES: CXR 11/04/21: In process.   EKG: 11/04/21: NSR, LVH   CV: US Carotid 11/04/21 (PRELIMINARY): Summary:  - Right Carotid: The extracranial vessels were near-normal with only minimal wall thickening or plaque.  - Left Carotid: The extracranial vessels were near-normal with only minimal wall thickening or plaque.  - Vertebrals:  Bilateral vertebral arteries demonstrate antegrade flow.  - Subclavians: Right subclavian artery flow was disturbed. Normal flow hemodynamics were seen in the left subclavian artery.    Echo 11/04/21: IMPRESSIONS   1. Left ventricular ejection fraction, by estimation, is 55 to 60%. The  left ventricle has normal function. The left ventricle has no regional  wall motion abnormalities. The left ventricular internal cavity size was  mildly dilated. There is mild  concentric left ventricular hypertrophy. Left ventricular diastolic  parameters were normal.   2. Right ventricular systolic function is normal. The right ventricular  size is normal.   3. Left atrial size was  mild to moderately dilated.   4. The mitral valve is grossly normal. Trivial mitral valve  regurgitation. No evidence of mitral stenosis.   5. Aortic valve not well visualized on current images but known to be  bicuspid from prior study. Severe eccentric AR, see image 41, jet fills  LVOT. The aortic valve was not well visualized. There is moderate  calcification of the aortic valve. Aortic  valve regurgitation is severe.   6. The inferior vena cava is normal in size with <50% respiratory  variability, suggesting right atrial pressure of 8 mmHg.  Comparison(s): No significant  change from prior study.  - Conclusion(s)/Recommendation(s): Severe eccentric AR with preserved EF.    RHC/LHC 10/07/21:   The left ventricular systolic function is normal.   LV end diastolic pressure is mildly elevated.   There is mild aortic valve stenosis.  Mild aortic valve gradient noted on pullback   No angiographically apparent coronary artery disease.   Ao saturation 96%, PA saturation 71%, PA pressure 35/17, mean PA pressure 25 mmHg; mean pulmonary capillary wedge pressure 17 mmHg; cardiac output 7.02 L/min, cardiac index 3.1. - Wide pulse pressure noted during the case likely related to her aortic insufficiency.  No coronary artery disease.  Continue with plans for management of aortic regurgitation.    TEE 08/31/21: IMPRESSIONS   1. The aortic valve is bicuspid with fusion of the RCC and LCC. The  leaflets are asymmetrically thickened most notably at the [facility] leaflet tip  with concern for vegetation/infective endocarditis. There is flail of one  of the leaflets (suspect NCC) with  lack of central coaptation and resultant severe, highly eccentric aortic  regurgitation.   2. Left ventricular ejection fraction, by estimation, is 65 to 70%. The  left ventricle has normal function.   3. Right ventricular systolic function is normal. The right ventricular  size is normal.   4. No left atrial/left atrial appendage thrombus was detected.   5. The mitral valve is normal in structure. Trivial mitral valve  regurgitation.   6. Aortic dilatation noted. There is borderline dilatation of the aortic  root, measuring 36 mm. There is mild-to-moderate dilatation of the  ascending aorta, measuring 43 mm.   7. The interatrial septum is aneurysmal. There is a PFO demonstrated by  color doppler. Weakly positive agitated saline bubble study.  - Comparison(s): Compared to prior TEE on 06/28/21, there continues to be  severe AR with concern for infective endocarditis.    Past Medical History:   Diagnosis Date   Aortic regurgitation    Aortic valve endocarditis 06/2021   due to spinal abscess   Arthritis    Depression    Hypertension    Kidney stone    Obesity     Past Surgical History:  Procedure Laterality Date   ABDOMINAL HYSTERECTOMY     BACK SURGERY     BUBBLE STUDY  08/31/2021   Procedure: BUBBLE STUDY;  Surgeon: Freada Bergeron, Mary Chambers;  Location: [place];  Service: Cardiovascular;;   CARPAL TUNNEL RELEASE     CHOLECYSTECTOMY     IR FLUORO GUIDED NEEDLE PLC ASPIRATION/INJECTION LOC  06/23/2021   LITHOTRIPSY     neck fusion     RIGHT/LEFT HEART CATH AND CORONARY ANGIOGRAPHY N/A 10/07/2021   Procedure: RIGHT/LEFT HEART CATH AND CORONARY ANGIOGRAPHY;  Surgeon: Jettie Booze, Mary Chambers;  Location: [facility];  Service: Cardiovascular;  Laterality: N/A;   TEE WITHOUT CARDIOVERSION N/A 06/28/2021   Procedure: TRANSESOPHAGEAL ECHOCARDIOGRAM (TEE);  Surgeon: Lelon Perla, Mary Chambers;  Location: [facility] ENDOSCOPY;  Service: Cardiovascular;  Laterality: N/A;   TEE WITHOUT CARDIOVERSION N/A 08/31/2021   Procedure: TRANSESOPHAGEAL ECHOCARDIOGRAM (TEE);  Surgeon: Freada Bergeron, Mary Chambers;  Location: [facility] ENDOSCOPY;  Service: Cardiovascular;  Laterality: N/A;   TUBAL LIGATION      MEDICATIONS:  Ascorbic Acid (VITAMIN C) 500 MG CAPS   atorvastatin (LIPITOR) 10 MG tablet   cholecalciferol (VITAMIN D3) 25 MCG (1000 UT) tablet   enalapril (VASOTEC) 10 MG tablet   fluticasone (FLONASE) 50 MCG/ACT nasal spray   furosemide (LASIX) 20 MG tablet   HYDROcodone-acetaminophen (NORCO/VICODIN) 5-325 MG tablet   loperamide (IMODIUM) 2 MG capsule   methylPREDNISolone (MEDROL DOSEPAK) 4 MG TBPK tablet   Multiple Vitamin (MULTIVITAMIN) capsule   naproxen (NAPROSYN) 500 MG tablet   pantoprazole (PROTONIX) 40 MG tablet   Potassium (POTASSIMIN PO)   tiZANidine (ZANAFLEX) 4 MG tablet   traMADol (ULTRAM) 50 MG tablet   No current facility-administered medications for this encounter.     [START ON 11/07/2021] ceFAZolin (ANCEF) IVPB 2g/100 mL premix   [START ON 11/07/2021] ceFAZolin (ANCEF) IVPB 3g/100 mL premix   [START ON 11/07/2021] dexmedetomidine (PRECEDEX) 400 MCG/100ML (4 mcg/mL) infusion   [START ON 11/07/2021] EPINEPHrine (ADRENALIN) 5 mg in NS 250 mL (0.02 mg/mL) premix infusion   [START ON 11/07/2021] heparin 30,000 units/NS 1000 mL solution for CELLSAVER   [START ON 11/07/2021] heparin sodium (porcine) 2,500 Units, papaverine 30 mg in electrolyte-A (PLASMALYTE-A PH 7.4) 500 mL irrigation   [START ON 11/07/2021] insulin regular, human (MYXREDLIN) 100 units/ 100 mL infusion   [START ON 11/07/2021] [facility] Blood Cardioplegia vial (lidocaine/magnesium/mannitol 0.26g-4g-6.4g)   [START ON 11/07/2021] milrinone (PRIMACOR) 20 MG/100 ML (0.2 mg/mL) infusion   [START ON 11/07/2021] nitroGLYCERIN 50 mg in dextrose 5 % 250 mL (0.2 mg/mL) infusion   [START ON 11/07/2021] norepinephrine (LEVOPHED) 4mg  in 259mL (0.016 mg/mL) premix infusion   [START ON 11/07/2021] phenylephrine (NEO-SYNEPHRINE) 20mg /NS 280mL premix infusion   [START ON 11/07/2021] potassium chloride injection 80 mEq   [START ON 11/07/2021] tranexamic acid (CYKLOKAPRON) 2,500 mg in sodium chloride 0.9 % 250 mL (10 mg/mL) infusion   [START ON 11/07/2021] tranexamic acid (CYKLOKAPRON) bolus via infusion - over 30 minutes 924 mg   [START ON 11/07/2021] tranexamic acid (CYKLOKAPRON) pump prime solution 166 mg   [START ON 11/07/2021] vancomycin (VANCOREADY) IVPB 1500 mg/300 mL    Myra Gianotti, PA-C Surgical Short Stay/Anesthesiology [facility] Phone [phone] [facility] Phone [phone] 11/04/2021 3:53 PM

## 2021-11-04 NOTE — Progress Notes (Signed)
Subjective:   Patient ID: Mary Chambers, female   DOB: 51 y.o.   MRN: [id number]   HPI Patient presents stating she has been getting a lot of pain in her forefoot bilateral for the last couple months and also is concerned about some digital deformities of the right upper left foot.  States she is due to have a heart procedure and has trouble walking.   ROS      Objective:  Physical Exam  Neurovascular status intact with patient found to have quite a bit of inflammation around the third MPJ bilateral with fluid buildup around the joint surface occurring and is noted to have digital deformity of the second digit right over left foot with relative rigid contracture of the toe     Assessment:  Inflammatory capsulitis of an acute nature of the lesser MPJ bilateral with digital deformity noted right over left foot     Plan:  H&P reviewed conditions discussed hammertoe and the consideration at 1 point in future for digital procedure confusion but I do not recommend that now and she is due to have aggressive surgery in the next week.  Today I did do a very careful periarticular injections around the third MPJ 2 mg dexamethasone Kenalog 5 mg Xylocaine to reduce the inflammatory process the patient will be seen back as needed  X-rays indicate no signs of fracture no signs of arthritis associated with inflammation with elevation rigid contracture digit to right

## 2021-11-04 NOTE — Progress Notes (Signed)
PCP - [name], NP- [facility] Cardiologist - Dr. [name]  PPM/ICD - n/a Device Orders - n/a Rep Notified - n/a  Chest x-ray - 11/04/21 EKG - 11/04/21 Stress Test -  ECHO - 11/04/21 Cardiac Cath - 10/07/21  Sleep Study - denies CPAP - n/a  Fasting Blood Sugar - n/a Checks Blood Sugar _____ times a day- n/a  Blood Thinner Instructions: n/a Aspirin Instructions: n/a  ERAS Protcol - No. NPO  COVID TEST- 11/04/21. Pending.    Anesthesia review: Yes. ECHO 11/04/21. Pending results.   Patient denies shortness of breath, fever, cough and chest pain at PAT appointment   All instructions explained to the patient, with a verbal understanding of the material. Patient agrees to go over the instructions while at home for a better understanding. Patient also instructed to self quarantine after being tested for COVID-19. The opportunity to ask questions was provided.

## 2021-11-04 NOTE — Anesthesia Preprocedure Evaluation (Addendum)
Anesthesia Evaluation  Patient identified by MRN, date of birth, ID band Patient awake    Reviewed: Allergy & Precautions, NPO status , Patient's Chart, lab work & pertinent test results  Airway Mallampati: II  TM Distance: >3 FB Neck ROM: Full    Dental  (+) Dental Advisory Given   Pulmonary neg pulmonary ROS,    Pulmonary exam normal breath sounds clear to auscultation       Cardiovascular hypertension, Pt. on medications Normal cardiovascular exam+ Valvular Problems/Murmurs AS and AI  Rhythm:Regular Rate:Normal  Echo 11/04/2021 1. Left ventricular ejection fraction, by estimation, is 55 to 60%. The left ventricle has normal function. The left ventricle has no regional wall motion abnormalities. The left ventricular internal cavity size was mildly dilated. There is mild concentric left ventricular hypertrophy. Left ventricular diastolic parameters were normal.  2. Right ventricular systolic function is normal. The right ventricular size is normal.  3. Left atrial size was mild to moderately dilated.  4. The mitral valve is grossly normal. Trivial mitral valve  regurgitation. No evidence of mitral stenosis.  5. Aortic valve not well visualized on current images but known to be bicuspid from prior study. Severe eccentric AR, see image 41, jet fills LVOT. The aortic valve was not well visualized. There is moderate calcification of the aortic valve. Aortic valve regurgitation is severe.  6. The inferior vena cava is normal in size with <50% respiratory variability, suggesting right atrial pressure of 8 mmHg.    Cath 10/2021   The left ventricular systolic function is normal.   LV end diastolic pressure is mildly elevated.   There is mild aortic valve stenosis.  Mild aortic valve gradient noted on pullback   No angiographically apparent coronary artery disease.   Ao saturation 96%, PA saturation 71%, PA pressure 35/17, mean PA  pressure 25 mmHg; mean pulmonary capillary wedge pressure 17 mmHg; cardiac output 7.02 L/min, cardiac index 3.1.  Wide pulse pressure noted during the case likely related to her aortic insufficiency.  No coronary artery disease.  Continue with plans for management of aortic regurgitation.   Echo 08/2021 1. The aortic valve is bicuspid with fusion of the RCC and LCC. The leaflets are asymmetrically thickened most notably at the [facility] leaflet tip with concern for vegetation/infective endocarditis. There is flail of one of the leaflets (suspect NCC) with lack of central coaptation and resultant severe, highly eccentric aortic regurgitation.  2. Left ventricular ejection fraction, by estimation, is 65 to 70%. The left ventricle has normal function.  3. Right ventricular systolic function is normal. The right ventricular size is normal.  4. No left atrial/left atrial appendage thrombus was detected.  5. The mitral valve is normal in structure. Trivial mitral valve regurgitation.  6. Aortic dilatation noted. There is borderline dilatation of the aortic root, measuring 36 mm. There is mild-to-moderate dilatation of the ascending aorta, measuring 43 mm.  7. The interatrial septum is aneurysmal. There is a PFO demonstrated by color doppler. Weakly positive agitated saline bubble study.     Neuro/Psych  Headaches, PSYCHIATRIC DISORDERS Anxiety Depression  Neuromuscular disease    GI/Hepatic Neg liver ROS, GERD  ,  Endo/Other  Morbid obesity  Renal/GU Renal disease     Musculoskeletal  (+) Arthritis ,   Abdominal (+) + obese,   Peds  Hematology negative hematology ROS (+)   Anesthesia Other Findings   Reproductive/Obstetrics  Anesthesia Physical Anesthesia Plan  ASA: 4  Anesthesia Plan: General   Post-op Pain Management:    Induction: Intravenous  PONV Risk Score and Plan: 4 or greater and Midazolam and Treatment may vary due  to age or medical condition  Airway Management Planned: Oral ETT  Additional Equipment: Arterial line, CVP, TEE and Ultrasound Guidance Line Placement  Intra-op Plan: Utilization Of Total Body Hypothermia per surgeon request  Post-operative Plan: Post-operative intubation/ventilation  Informed Consent: I have reviewed the patients History and Physical, chart, labs and discussed the procedure including the risks, benefits and alternatives for the proposed anesthesia with the patient or authorized representative who has indicated his/her understanding and acceptance.     Dental advisory given  Plan Discussed with: CRNA  Anesthesia Plan Comments: (PAT note written 11/04/2021 by Myra Gianotti, PA-C. )      Anesthesia Quick Evaluation

## 2021-11-06 ENCOUNTER — Encounter (HOSPITAL_COMMUNITY): Payer: Self-pay | Admitting: Thoracic Surgery (Cardiothoracic Vascular Surgery)

## 2021-11-07 ENCOUNTER — Other Ambulatory Visit: Payer: Self-pay

## 2021-11-07 ENCOUNTER — Inpatient Hospital Stay (HOSPITAL_COMMUNITY): Payer: 59 | Admitting: Certified Registered Nurse Anesthetist

## 2021-11-07 ENCOUNTER — Inpatient Hospital Stay (HOSPITAL_COMMUNITY): Payer: 59

## 2021-11-07 ENCOUNTER — Inpatient Hospital Stay (HOSPITAL_COMMUNITY)
Admission: RE | Admit: 2021-11-07 | Discharge: 2021-11-16 | DRG: 220 | Disposition: A | Discharge status: Home Health | Payer: 59 | Attending: Thoracic Surgery (Cardiothoracic Vascular Surgery) | Admitting: Thoracic Surgery (Cardiothoracic Vascular Surgery)

## 2021-11-07 ENCOUNTER — Encounter (HOSPITAL_COMMUNITY)
Admission: RE | Disposition: A | Discharge status: Home Health | Payer: Self-pay | Source: Home / Self Care | Attending: Thoracic Surgery (Cardiothoracic Vascular Surgery)

## 2021-11-07 DIAGNOSIS — D62 Acute posthemorrhagic anemia: Secondary | ICD-10-CM | POA: Diagnosis not present

## 2021-11-07 DIAGNOSIS — K219 Gastro-esophageal reflux disease without esophagitis: Secondary | ICD-10-CM | POA: Diagnosis present

## 2021-11-07 DIAGNOSIS — Z9049 Acquired absence of other specified parts of digestive tract: Secondary | ICD-10-CM

## 2021-11-07 DIAGNOSIS — Z6841 Body Mass Index (BMI) 40.0 and over, adult: Secondary | ICD-10-CM

## 2021-11-07 DIAGNOSIS — Z23 Encounter for immunization: Secondary | ICD-10-CM

## 2021-11-07 DIAGNOSIS — Z8616 Personal history of COVID-19: Secondary | ICD-10-CM | POA: Diagnosis not present

## 2021-11-07 DIAGNOSIS — Z981 Arthrodesis status: Secondary | ICD-10-CM | POA: Diagnosis not present

## 2021-11-07 DIAGNOSIS — Z87442 Personal history of urinary calculi: Secondary | ICD-10-CM

## 2021-11-07 DIAGNOSIS — I358 Other nonrheumatic aortic valve disorders: Secondary | ICD-10-CM | POA: Diagnosis present

## 2021-11-07 DIAGNOSIS — M48062 Spinal stenosis, lumbar region with neurogenic claudication: Secondary | ICD-10-CM | POA: Diagnosis present

## 2021-11-07 DIAGNOSIS — I351 Nonrheumatic aortic (valve) insufficiency: Secondary | ICD-10-CM | POA: Diagnosis not present

## 2021-11-07 DIAGNOSIS — F32A Depression, unspecified: Secondary | ICD-10-CM | POA: Diagnosis not present

## 2021-11-07 DIAGNOSIS — M5030 Other cervical disc degeneration, unspecified cervical region: Secondary | ICD-10-CM | POA: Diagnosis present

## 2021-11-07 DIAGNOSIS — I10 Essential (primary) hypertension: Secondary | ICD-10-CM | POA: Diagnosis not present

## 2021-11-07 DIAGNOSIS — Z8719 Personal history of other diseases of the digestive system: Secondary | ICD-10-CM | POA: Diagnosis not present

## 2021-11-07 DIAGNOSIS — F419 Anxiety disorder, unspecified: Secondary | ICD-10-CM | POA: Diagnosis present

## 2021-11-07 DIAGNOSIS — Z20822 Contact with and (suspected) exposure to covid-19: Secondary | ICD-10-CM | POA: Diagnosis present

## 2021-11-07 DIAGNOSIS — Z952 Presence of prosthetic heart valve: Secondary | ICD-10-CM

## 2021-11-07 DIAGNOSIS — Z9851 Tubal ligation status: Secondary | ICD-10-CM | POA: Diagnosis not present

## 2021-11-07 DIAGNOSIS — Z79899 Other long term (current) drug therapy: Secondary | ICD-10-CM | POA: Diagnosis not present

## 2021-11-07 DIAGNOSIS — I350 Nonrheumatic aortic (valve) stenosis: Secondary | ICD-10-CM

## 2021-11-07 DIAGNOSIS — G894 Chronic pain syndrome: Secondary | ICD-10-CM | POA: Diagnosis present

## 2021-11-07 DIAGNOSIS — J90 Pleural effusion, not elsewhere classified: Secondary | ICD-10-CM

## 2021-11-07 HISTORY — PX: TEE WITHOUT CARDIOVERSION: SHX5443

## 2021-11-07 HISTORY — PX: AORTIC VALVE REPLACEMENT: SHX41

## 2021-11-07 LAB — POCT I-STAT 7, (LYTES, BLD GAS, ICA,H+H)
Acid-Base Excess: 0 mmol/L (ref 0.0–2.0)
Acid-Base Excess: 2 mmol/L (ref 0.0–2.0)
Acid-Base Excess: 3 mmol/L — ABNORMAL HIGH (ref 0.0–2.0)
Acid-Base Excess: 1 mmol/L (ref 0.0–2.0)
Acid-Base Excess: 1 mmol/L (ref 0.0–2.0)
Acid-base deficit: 2 mmol/L (ref 0.0–2.0)
Acid-base deficit: 5 mmol/L — ABNORMAL HIGH (ref 0.0–2.0)
Acid-base deficit: 4 mmol/L — ABNORMAL HIGH (ref 0.0–2.0)
Acid-base deficit: 4 mmol/L — ABNORMAL HIGH (ref 0.0–2.0)
Bicarbonate: 25.9 mmol/L (ref 20.0–28.0)
Bicarbonate: 27.3 mmol/L (ref 20.0–28.0)
Bicarbonate: 27.6 mmol/L (ref 20.0–28.0)
Bicarbonate: 26.4 mmol/L (ref 20.0–28.0)
Bicarbonate: 26.4 mmol/L (ref 20.0–28.0)
Bicarbonate: 23.3 mmol/L (ref 20.0–28.0)
Bicarbonate: 20.3 mmol/L (ref 20.0–28.0)
Bicarbonate: 22 mmol/L (ref 20.0–28.0)
Bicarbonate: 22.6 mmol/L (ref 20.0–28.0)
Calcium, Ion: 1.2 mmol/L (ref 1.15–1.40)
Calcium, Ion: 1.04 mmol/L — ABNORMAL LOW (ref 1.15–1.40)
Calcium, Ion: 1.06 mmol/L — ABNORMAL LOW (ref 1.15–1.40)
Calcium, Ion: 1.06 mmol/L — ABNORMAL LOW (ref 1.15–1.40)
Calcium, Ion: 1.06 mmol/L — ABNORMAL LOW (ref 1.15–1.40)
Calcium, Ion: 1.2 mmol/L (ref 1.15–1.40)
Calcium, Ion: 1.12 mmol/L — ABNORMAL LOW (ref 1.15–1.40)
Calcium, Ion: 1.15 mmol/L (ref 1.15–1.40)
Calcium, Ion: 1.17 mmol/L (ref 1.15–1.40)
HCT: 32 % — ABNORMAL LOW (ref 36.0–46.0)
HCT: 26 % — ABNORMAL LOW (ref 36.0–46.0)
HCT: 30 % — ABNORMAL LOW (ref 36.0–46.0)
HCT: 28 % — ABNORMAL LOW (ref 36.0–46.0)
HCT: 29 % — ABNORMAL LOW (ref 36.0–46.0)
HCT: 28 % — ABNORMAL LOW (ref 36.0–46.0)
HCT: 28 % — ABNORMAL LOW (ref 36.0–46.0)
HCT: 28 % — ABNORMAL LOW (ref 36.0–46.0)
HCT: 29 % — ABNORMAL LOW (ref 36.0–46.0)
Hemoglobin: 10.9 g/dL — ABNORMAL LOW (ref 12.0–15.0)
Hemoglobin: 10.2 g/dL — ABNORMAL LOW (ref 12.0–15.0)
Hemoglobin: 8.8 g/dL — ABNORMAL LOW (ref 12.0–15.0)
Hemoglobin: 9.5 g/dL — ABNORMAL LOW (ref 12.0–15.0)
Hemoglobin: 9.9 g/dL — ABNORMAL LOW (ref 12.0–15.0)
Hemoglobin: 9.5 g/dL — ABNORMAL LOW (ref 12.0–15.0)
Hemoglobin: 9.5 g/dL — ABNORMAL LOW (ref 12.0–15.0)
Hemoglobin: 9.5 g/dL — ABNORMAL LOW (ref 12.0–15.0)
Hemoglobin: 9.9 g/dL — ABNORMAL LOW (ref 12.0–15.0)
O2 Saturation: 100 %
O2 Saturation: 100 %
O2 Saturation: 100 %
O2 Saturation: 100 %
O2 Saturation: 100 %
O2 Saturation: 100 %
O2 Saturation: 97 %
O2 Saturation: 98 %
O2 Saturation: 99 %
Patient temperature: 36.8
Patient temperature: 98
Patient temperature: 98
Potassium: 4.2 mmol/L (ref 3.5–5.1)
Potassium: 4.3 mmol/L (ref 3.5–5.1)
Potassium: 4.7 mmol/L (ref 3.5–5.1)
Potassium: 4.6 mmol/L (ref 3.5–5.1)
Potassium: 5.2 mmol/L — ABNORMAL HIGH (ref 3.5–5.1)
Potassium: 4.2 mmol/L (ref 3.5–5.1)
Potassium: 4 mmol/L (ref 3.5–5.1)
Potassium: 4.9 mmol/L (ref 3.5–5.1)
Potassium: 4.8 mmol/L (ref 3.5–5.1)
Sodium: 140 mmol/L (ref 135–145)
Sodium: 138 mmol/L (ref 135–145)
Sodium: 138 mmol/L (ref 135–145)
Sodium: 136 mmol/L (ref 135–145)
Sodium: 138 mmol/L (ref 135–145)
Sodium: 140 mmol/L (ref 135–145)
Sodium: 142 mmol/L (ref 135–145)
Sodium: 140 mmol/L (ref 135–145)
Sodium: 140 mmol/L (ref 135–145)
TCO2: 27 mmol/L (ref 22–32)
TCO2: 29 mmol/L (ref 22–32)
TCO2: 29 mmol/L (ref 22–32)
TCO2: 28 mmol/L (ref 22–32)
TCO2: 28 mmol/L (ref 22–32)
TCO2: 25 mmol/L (ref 22–32)
TCO2: 21 mmol/L — ABNORMAL LOW (ref 22–32)
TCO2: 23 mmol/L (ref 22–32)
TCO2: 24 mmol/L (ref 22–32)
pCO2 arterial: 45.6 mmHg (ref 32.0–48.0)
pCO2 arterial: 41.5 mmHg (ref 32.0–48.0)
pCO2 arterial: 43.6 mmHg (ref 32.0–48.0)
pCO2 arterial: 42.7 mmHg (ref 32.0–48.0)
pCO2 arterial: 43.5 mmHg (ref 32.0–48.0)
pCO2 arterial: 40.6 mmHg (ref 32.0–48.0)
pCO2 arterial: 38.4 mmHg (ref 32.0–48.0)
pCO2 arterial: 44.1 mmHg (ref 32.0–48.0)
pCO2 arterial: 46.4 mmHg (ref 32.0–48.0)
pH, Arterial: 7.363 (ref 7.350–7.450)
pH, Arterial: 7.405 (ref 7.350–7.450)
pH, Arterial: 7.431 (ref 7.350–7.450)
pH, Arterial: 7.391 (ref 7.350–7.450)
pH, Arterial: 7.398 (ref 7.350–7.450)
pH, Arterial: 7.368 (ref 7.350–7.450)
pH, Arterial: 7.33 — ABNORMAL LOW (ref 7.350–7.450)
pH, Arterial: 7.304 — ABNORMAL LOW (ref 7.350–7.450)
pH, Arterial: 7.295 — ABNORMAL LOW (ref 7.350–7.450)
pO2, Arterial: 328 mmHg — ABNORMAL HIGH (ref 83.0–108.0)
pO2, Arterial: 343 mmHg — ABNORMAL HIGH (ref 83.0–108.0)
pO2, Arterial: 369 mmHg — ABNORMAL HIGH (ref 83.0–108.0)
pO2, Arterial: 324 mmHg — ABNORMAL HIGH (ref 83.0–108.0)
pO2, Arterial: 342 mmHg — ABNORMAL HIGH (ref 83.0–108.0)
pO2, Arterial: 187 mmHg — ABNORMAL HIGH (ref 83.0–108.0)
pO2, Arterial: 93 mmHg (ref 83.0–108.0)
pO2, Arterial: 115 mmHg — ABNORMAL HIGH (ref 83.0–108.0)
pO2, Arterial: 135 mmHg — ABNORMAL HIGH (ref 83.0–108.0)

## 2021-11-07 LAB — POCT I-STAT, CHEM 8
BUN: 21 mg/dL — ABNORMAL HIGH (ref 6–20)
BUN: 20 mg/dL (ref 6–20)
BUN: 20 mg/dL (ref 6–20)
BUN: 22 mg/dL — ABNORMAL HIGH (ref 6–20)
BUN: 20 mg/dL (ref 6–20)
Calcium, Ion: 1.22 mmol/L (ref 1.15–1.40)
Calcium, Ion: 1.15 mmol/L (ref 1.15–1.40)
Calcium, Ion: 1.06 mmol/L — ABNORMAL LOW (ref 1.15–1.40)
Calcium, Ion: 1.07 mmol/L — ABNORMAL LOW (ref 1.15–1.40)
Calcium, Ion: 1.2 mmol/L (ref 1.15–1.40)
Chloride: 105 mmol/L (ref 98–111)
Chloride: 106 mmol/L (ref 98–111)
Chloride: 104 mmol/L (ref 98–111)
Chloride: 105 mmol/L (ref 98–111)
Chloride: 106 mmol/L (ref 98–111)
Creatinine, Ser: 0.8 mg/dL (ref 0.44–1.00)
Creatinine, Ser: 0.7 mg/dL (ref 0.44–1.00)
Creatinine, Ser: 0.8 mg/dL (ref 0.44–1.00)
Creatinine, Ser: 0.8 mg/dL (ref 0.44–1.00)
Creatinine, Ser: 0.8 mg/dL (ref 0.44–1.00)
Glucose, Bld: 91 mg/dL (ref 70–99)
Glucose, Bld: 95 mg/dL (ref 70–99)
Glucose, Bld: 127 mg/dL — ABNORMAL HIGH (ref 70–99)
Glucose, Bld: 149 mg/dL — ABNORMAL HIGH (ref 70–99)
Glucose, Bld: 127 mg/dL — ABNORMAL HIGH (ref 70–99)
HCT: 32 % — ABNORMAL LOW (ref 36.0–46.0)
HCT: 29 % — ABNORMAL LOW (ref 36.0–46.0)
HCT: 32 % — ABNORMAL LOW (ref 36.0–46.0)
HCT: 29 % — ABNORMAL LOW (ref 36.0–46.0)
HCT: 27 % — ABNORMAL LOW (ref 36.0–46.0)
Hemoglobin: 10.9 g/dL — ABNORMAL LOW (ref 12.0–15.0)
Hemoglobin: 9.9 g/dL — ABNORMAL LOW (ref 12.0–15.0)
Hemoglobin: 10.9 g/dL — ABNORMAL LOW (ref 12.0–15.0)
Hemoglobin: 9.9 g/dL — ABNORMAL LOW (ref 12.0–15.0)
Hemoglobin: 9.2 g/dL — ABNORMAL LOW (ref 12.0–15.0)
Potassium: 4.2 mmol/L (ref 3.5–5.1)
Potassium: 3.9 mmol/L (ref 3.5–5.1)
Potassium: 4.7 mmol/L (ref 3.5–5.1)
Potassium: 5.2 mmol/L — ABNORMAL HIGH (ref 3.5–5.1)
Potassium: 4.2 mmol/L (ref 3.5–5.1)
Sodium: 141 mmol/L (ref 135–145)
Sodium: 140 mmol/L (ref 135–145)
Sodium: 138 mmol/L (ref 135–145)
Sodium: 137 mmol/L (ref 135–145)
Sodium: 140 mmol/L (ref 135–145)
TCO2: 28 mmol/L (ref 22–32)
TCO2: 24 mmol/L (ref 22–32)
TCO2: 27 mmol/L (ref 22–32)
TCO2: 25 mmol/L (ref 22–32)
TCO2: 24 mmol/L (ref 22–32)

## 2021-11-07 LAB — CBC
HCT: 31.3 % — ABNORMAL LOW (ref 36.0–46.0)
HCT: 30.5 % — ABNORMAL LOW (ref 36.0–46.0)
Hemoglobin: 10.1 g/dL — ABNORMAL LOW (ref 12.0–15.0)
Hemoglobin: 9.6 g/dL — ABNORMAL LOW (ref 12.0–15.0)
MCH: 27.2 pg (ref 26.0–34.0)
MCH: 26.8 pg (ref 26.0–34.0)
MCHC: 32.3 g/dL (ref 30.0–36.0)
MCHC: 31.5 g/dL (ref 30.0–36.0)
MCV: 84.4 fL (ref 80.0–100.0)
MCV: 85.2 fL (ref 80.0–100.0)
Platelets: 149 10*3/uL — ABNORMAL LOW (ref 150–400)
Platelets: 140 10*3/uL — ABNORMAL LOW (ref 150–400)
RBC: 3.71 MIL/uL — ABNORMAL LOW (ref 3.87–5.11)
RBC: 3.58 MIL/uL — ABNORMAL LOW (ref 3.87–5.11)
RDW: 14.7 % (ref 11.5–15.5)
RDW: 14.8 % (ref 11.5–15.5)
WBC: 31.9 10*3/uL — ABNORMAL HIGH (ref 4.0–10.5)
WBC: 21.8 10*3/uL — ABNORMAL HIGH (ref 4.0–10.5)
nRBC: 0 % (ref 0.0–0.2)
nRBC: 0 % (ref 0.0–0.2)

## 2021-11-07 LAB — BASIC METABOLIC PANEL
Anion gap: 7 (ref 5–15)
BUN: 21 mg/dL — ABNORMAL HIGH (ref 6–20)
CO2: 23 mmol/L (ref 22–32)
Calcium: 7.8 mg/dL — ABNORMAL LOW (ref 8.9–10.3)
Chloride: 106 mmol/L (ref 98–111)
Creatinine, Ser: 0.94 mg/dL (ref 0.44–1.00)
GFR, Estimated: >60 mL/min (ref >60)
Glucose, Bld: 139 mg/dL — ABNORMAL HIGH (ref 70–99)
Potassium: 4.9 mmol/L (ref 3.5–5.1)
Sodium: 136 mmol/L (ref 135–145)

## 2021-11-07 LAB — POCT I-STAT EG7
Acid-Base Excess: 1 mmol/L (ref 0.0–2.0)
Bicarbonate: 25.6 mmol/L (ref 20.0–28.0)
Calcium, Ion: 1.06 mmol/L — ABNORMAL LOW (ref 1.15–1.40)
HCT: 26 % — ABNORMAL LOW (ref 36.0–46.0)
Hemoglobin: 8.8 g/dL — ABNORMAL LOW (ref 12.0–15.0)
O2 Saturation: 78 %
Potassium: 4.9 mmol/L (ref 3.5–5.1)
Sodium: 139 mmol/L (ref 135–145)
TCO2: 27 mmol/L (ref 22–32)
pCO2, Ven: 42.6 mmHg — ABNORMAL LOW (ref 44.0–60.0)
pH, Ven: 7.387 (ref 7.250–7.430)
pO2, Ven: 43 mmHg (ref 32.0–45.0)

## 2021-11-07 LAB — GLUCOSE, CAPILLARY
Glucose-Capillary: 84 mg/dL (ref 70–99)
Glucose-Capillary: 102 mg/dL — ABNORMAL HIGH (ref 70–99)
Glucose-Capillary: 110 mg/dL — ABNORMAL HIGH (ref 70–99)
Glucose-Capillary: 73 mg/dL (ref 70–99)
Glucose-Capillary: 146 mg/dL — ABNORMAL HIGH (ref 70–99)
Glucose-Capillary: 112 mg/dL — ABNORMAL HIGH (ref 70–99)
Glucose-Capillary: 154 mg/dL — ABNORMAL HIGH (ref 70–99)
Glucose-Capillary: 129 mg/dL — ABNORMAL HIGH (ref 70–99)
Glucose-Capillary: 168 mg/dL — ABNORMAL HIGH (ref 70–99)
Glucose-Capillary: 144 mg/dL — ABNORMAL HIGH (ref 70–99)
Glucose-Capillary: 136 mg/dL — ABNORMAL HIGH (ref 70–99)

## 2021-11-07 LAB — PROTIME-INR
INR: 1.6 — ABNORMAL HIGH (ref 0.8–1.2)
Prothrombin Time: 19 seconds — ABNORMAL HIGH (ref 11.4–15.2)

## 2021-11-07 LAB — ECHO INTRAOPERATIVE TEE
Height: 67 in
Weight: 4064 oz

## 2021-11-07 LAB — MAGNESIUM: Magnesium: 3 mg/dL — ABNORMAL HIGH (ref 1.7–2.4)

## 2021-11-07 LAB — PLATELET COUNT: Platelets: 173 10*3/uL (ref 150–400)

## 2021-11-07 LAB — HEMOGLOBIN AND HEMATOCRIT, BLOOD
HCT: 30.1 % — ABNORMAL LOW (ref 36.0–46.0)
Hemoglobin: 9.8 g/dL — ABNORMAL LOW (ref 12.0–15.0)

## 2021-11-07 LAB — APTT: aPTT: 31 seconds (ref 24–36)

## 2021-11-07 LAB — ABO/RH: ABO/RH(D): O POS

## 2021-11-07 IMAGING — DX DG CHEST 1V PORT
1 series · 1 of 1 positions shown · non-contrast
Comparison: [DATE]

CLINICAL DATA: Postop from aortic valve replacement.

EXAM:
PORTABLE CHEST 1 VIEW

[chest ap]
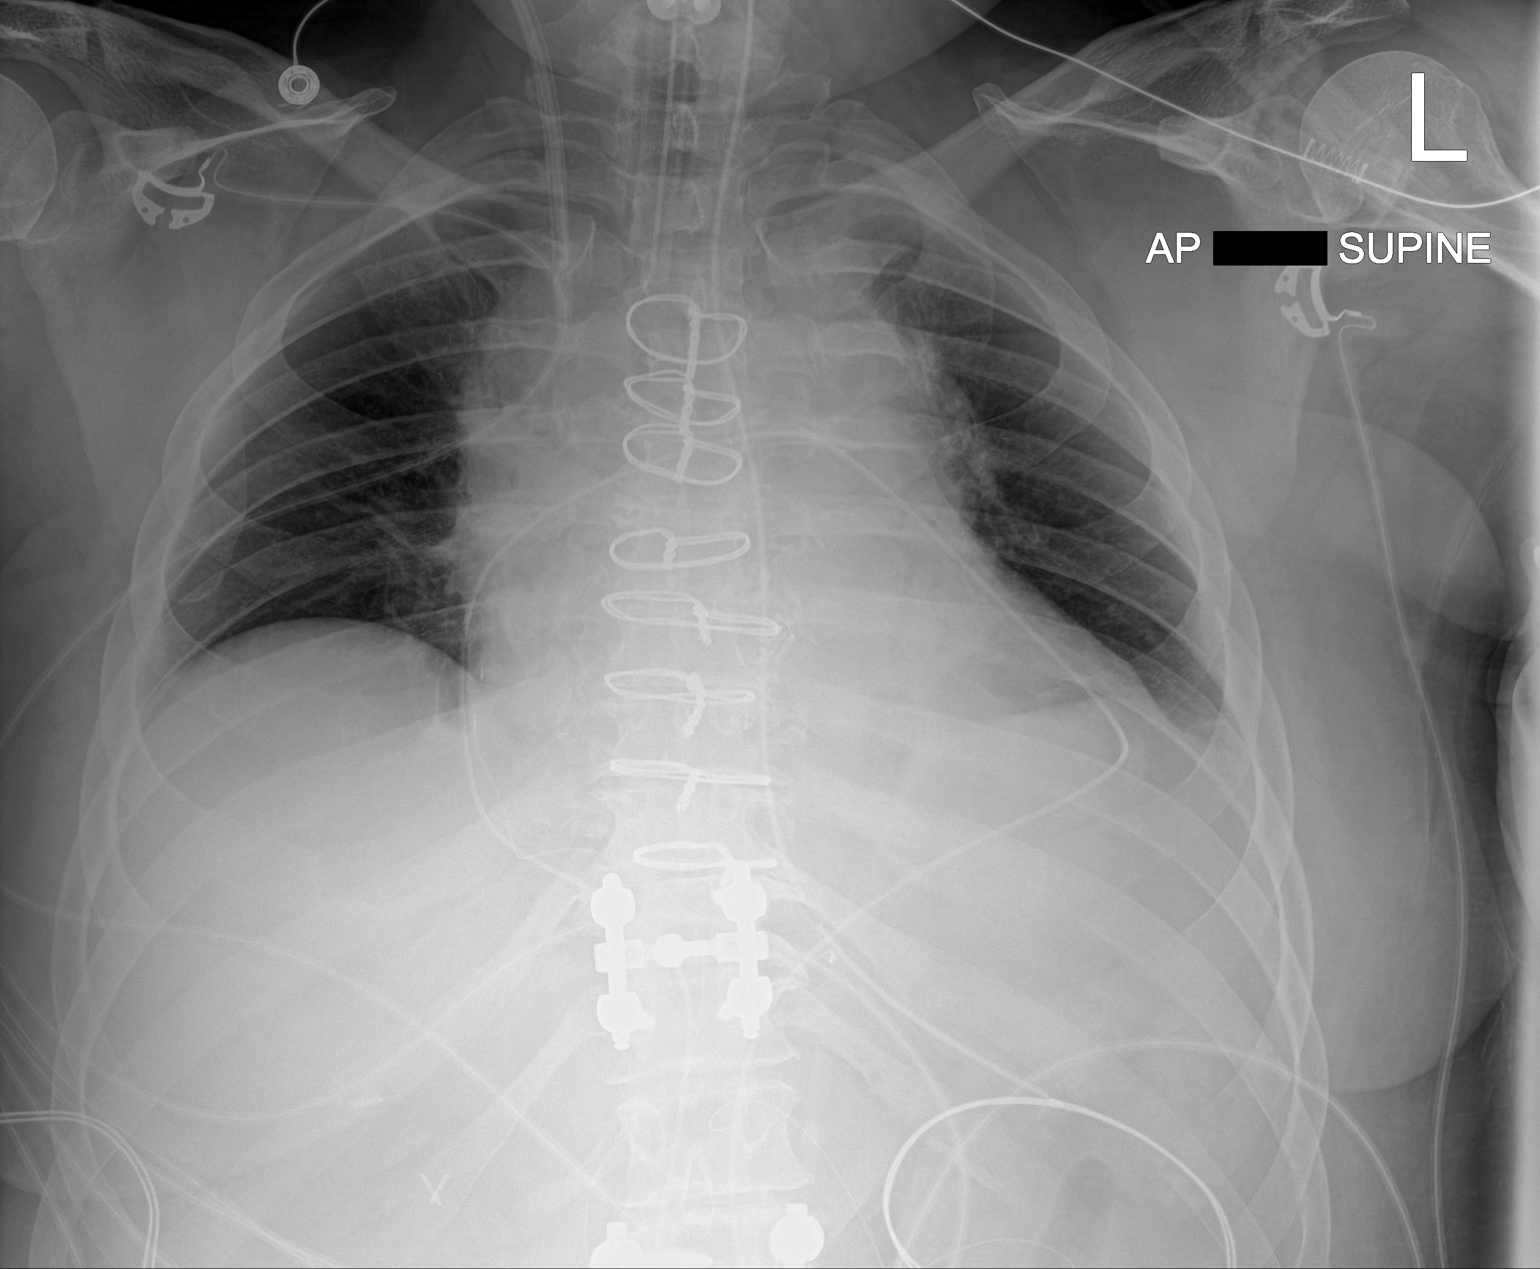

[1 of 1 positions shown; findings below may reference images not displayed]

FINDINGS: Patient has undergone median sternotomy since prior study.
Endotracheal tube and nasogastric tube are seen in appropriate
position. A right jugular central venous catheter is seen with tip
overlying the proximal SVC. Mediastinal drains also noted.

Low lung volumes are noted. No evidence of pneumothorax. Both lungs
are clear. Heart size is stable allowing for decreased lung volumes.
IMPRESSION: Support lines and tubes in appropriate position.

Low lung volumes. No active lung disease.

## 2021-11-07 SURGERY — REPLACEMENT, AORTIC VALVE, OPEN
Anesthesia: General | Site: Esophagus

## 2021-11-07 MED ORDER — METOPROLOL TARTRATE 5 MG/5ML IV SOLN: 2.5000 mg | INTRAVENOUS | Status: DC | PRN

## 2021-11-07 MED ORDER — CEFAZOLIN SODIUM-DEXTROSE 2-4 GM/100ML-% IV SOLN
2.0000 g | Freq: Three times a day (TID) | INTRAVENOUS | Status: AC
  Administered 2021-11-07 – 2021-11-09 (×6): 2 g via INTRAVENOUS
  Filled 2021-11-07 (×7): qty 100

## 2021-11-07 MED ORDER — TIZANIDINE HCL 4 MG PO TABS: 4.0000 mg | ORAL_TABLET | Freq: Every evening | ORAL | Status: DC | PRN

## 2021-11-07 MED ORDER — LACTATED RINGERS IV SOLN: INTRAVENOUS | Status: DC | PRN

## 2021-11-07 MED ORDER — ASPIRIN EC 325 MG PO TBEC: 325.0000 mg | DELAYED_RELEASE_TABLET | Freq: Every day | ORAL | Status: DC

## 2021-11-07 MED ORDER — FENTANYL CITRATE (PF) 250 MCG/5ML IJ SOLN
INTRAMUSCULAR | Status: AC
  Filled 2021-11-07: qty 5

## 2021-11-07 MED ORDER — ACETAMINOPHEN 160 MG/5ML PO SOLN: 1000.0000 mg | Freq: Four times a day (QID) | ORAL | Status: AC

## 2021-11-07 MED ORDER — PHENYLEPHRINE 40 MCG/ML (10ML) SYRINGE FOR IV PUSH (FOR BLOOD PRESSURE SUPPORT)
PREFILLED_SYRINGE | INTRAVENOUS | Status: DC | PRN
  Administered 2021-11-07: 120 ug via INTRAVENOUS
  Administered 2021-11-07: 160 ug via INTRAVENOUS
  Administered 2021-11-07: 40 ug via INTRAVENOUS
  Administered 2021-11-07: 120 ug via INTRAVENOUS
  Administered 2021-11-07: 40 ug via INTRAVENOUS
  Administered 2021-11-07: 80 ug via INTRAVENOUS
  Administered 2021-11-07: 120 ug via INTRAVENOUS

## 2021-11-07 MED ORDER — PROPOFOL 10 MG/ML IV BOLUS
INTRAVENOUS | Status: AC
  Filled 2021-11-07: qty 20

## 2021-11-07 MED ORDER — METOPROLOL TARTRATE 12.5 MG HALF TABLET
12.5000 mg | ORAL_TABLET | Freq: Once | ORAL | Status: AC
  Administered 2021-11-07: 12.5 mg via ORAL
  Filled 2021-11-07: qty 1

## 2021-11-07 MED ORDER — ACETAMINOPHEN 160 MG/5ML PO SOLN
650.0000 mg | Freq: Once | ORAL | Status: AC
  Administered 2021-11-07: 650 mg

## 2021-11-07 MED ORDER — METOCLOPRAMIDE HCL 5 MG/ML IJ SOLN
10.0000 mg | Freq: Four times a day (QID) | INTRAMUSCULAR | Status: AC
  Administered 2021-11-07 – 2021-11-08 (×3): 10 mg via INTRAVENOUS
  Filled 2021-11-07 (×3): qty 2

## 2021-11-07 MED ORDER — PHENYLEPHRINE HCL-NACL 20-0.9 MG/250ML-% IV SOLN: 0.0000 ug/min | INTRAVENOUS | Status: DC

## 2021-11-07 MED ORDER — ACETAMINOPHEN 650 MG RE SUPP: 650.0000 mg | Freq: Once | RECTAL | Status: AC

## 2021-11-07 MED ORDER — FAMOTIDINE IN NACL 20-0.9 MG/50ML-% IV SOLN
20.0000 mg | Freq: Two times a day (BID) | INTRAVENOUS | Status: DC
  Administered 2021-11-07: 20 mg via INTRAVENOUS
  Filled 2021-11-07: qty 50

## 2021-11-07 MED ORDER — METOPROLOL TARTRATE 12.5 MG HALF TABLET
12.5000 mg | ORAL_TABLET | Freq: Two times a day (BID) | ORAL | Status: DC
  Administered 2021-11-08 (×2): 12.5 mg via ORAL
  Filled 2021-11-07 (×2): qty 1

## 2021-11-07 MED ORDER — CHLORHEXIDINE GLUCONATE 4 % EX LIQD: 30.0000 mL | CUTANEOUS | Status: DC

## 2021-11-07 MED ORDER — MIDAZOLAM HCL (PF) 10 MG/2ML IJ SOLN
INTRAMUSCULAR | Status: AC
  Filled 2021-11-07: qty 2

## 2021-11-07 MED ORDER — ROCURONIUM BROMIDE 10 MG/ML (PF) SYRINGE
PREFILLED_SYRINGE | INTRAVENOUS | Status: DC | PRN
Start: 2021-11-07 — End: 2021-11-07
  Administered 2021-11-07: 100 mg via INTRAVENOUS
  Administered 2021-11-07: 30 mg via INTRAVENOUS
  Administered 2021-11-07: 100 mg via INTRAVENOUS

## 2021-11-07 MED ORDER — LACTATED RINGERS IV SOLN: INTRAVENOUS | Status: DC

## 2021-11-07 MED ORDER — DEXTROSE 50 % IV SOLN: 0.0000 mL | INTRAVENOUS | Status: DC | PRN

## 2021-11-07 MED ORDER — ONDANSETRON HCL 4 MG/2ML IJ SOLN
4.0000 mg | Freq: Four times a day (QID) | INTRAMUSCULAR | Status: DC | PRN
  Administered 2021-11-08 – 2021-11-15 (×6): 4 mg via INTRAVENOUS
  Filled 2021-11-07 (×6): qty 2

## 2021-11-07 MED ORDER — STERILE WATER FOR IRRIGATION IR SOLN
Status: DC | PRN
  Administered 2021-11-07: 2000 mL

## 2021-11-07 MED ORDER — EPINEPHRINE HCL 5 MG/250ML IV SOLN IN NS: 0.0000 ug/min | INTRAVENOUS | Status: DC

## 2021-11-07 MED ORDER — SODIUM CHLORIDE 0.9 % IV SOLN
INTRAVENOUS | Status: DC | PRN
Start: 2021-11-07 — End: 2021-11-07

## 2021-11-07 MED ORDER — ACETAMINOPHEN 500 MG PO TABS
1000.0000 mg | ORAL_TABLET | Freq: Four times a day (QID) | ORAL | Status: AC
  Administered 2021-11-08 – 2021-11-12 (×15): 1000 mg via ORAL
  Filled 2021-11-07 (×17): qty 2

## 2021-11-07 MED ORDER — CHLORHEXIDINE GLUCONATE 0.12 % MT SOLN
15.0000 mL | Freq: Once | OROMUCOSAL | Status: AC
  Administered 2021-11-07: 15 mL via OROMUCOSAL
  Filled 2021-11-07: qty 15

## 2021-11-07 MED ORDER — BISACODYL 5 MG PO TBEC
10.0000 mg | DELAYED_RELEASE_TABLET | Freq: Every day | ORAL | Status: DC
  Administered 2021-11-08: 10 mg via ORAL
  Filled 2021-11-07 (×7): qty 2

## 2021-11-07 MED ORDER — NITROGLYCERIN IN D5W 200-5 MCG/ML-% IV SOLN: 0.0000 ug/min | INTRAVENOUS | Status: DC

## 2021-11-07 MED ORDER — MIDAZOLAM HCL (PF) 5 MG/ML IJ SOLN
INTRAMUSCULAR | Status: DC | PRN
Start: 2021-11-07 — End: 2021-11-07
  Administered 2021-11-07: 5 mg via INTRAVENOUS
  Administered 2021-11-07: 1 mg via INTRAVENOUS
  Administered 2021-11-07 (×2): 2 mg via INTRAVENOUS

## 2021-11-07 MED ORDER — LACTATED RINGERS IV SOLN: 500.0000 mL | Freq: Once | INTRAVENOUS | Status: DC | PRN

## 2021-11-07 MED ORDER — 0.9 % SODIUM CHLORIDE (POUR BTL) OPTIME
TOPICAL | Status: DC | PRN
  Administered 2021-11-07: 5000 mL

## 2021-11-07 MED ORDER — SODIUM CHLORIDE 0.9 % IV SOLN: 250.0000 mL | INTRAVENOUS | Status: DC

## 2021-11-07 MED ORDER — FENTANYL CITRATE PF 50 MCG/ML IJ SOSY
50.0000 ug | PREFILLED_SYRINGE | INTRAMUSCULAR | Status: DC | PRN
  Administered 2021-11-07 (×2): 50 ug via INTRAVENOUS
  Filled 2021-11-07 (×2): qty 1

## 2021-11-07 MED ORDER — ATORVASTATIN CALCIUM 10 MG PO TABS
10.0000 mg | ORAL_TABLET | Freq: Every day | ORAL | Status: DC
  Administered 2021-11-09 – 2021-11-16 (×8): 10 mg via ORAL
  Filled 2021-11-07 (×9): qty 1

## 2021-11-07 MED ORDER — SODIUM CHLORIDE 0.9% FLUSH
3.0000 mL | Freq: Two times a day (BID) | INTRAVENOUS | Status: DC
  Administered 2021-11-08 – 2021-11-09 (×2): 3 mL via INTRAVENOUS

## 2021-11-07 MED ORDER — PHENYLEPHRINE 40 MCG/ML (10ML) SYRINGE FOR IV PUSH (FOR BLOOD PRESSURE SUPPORT)
PREFILLED_SYRINGE | INTRAVENOUS | Status: AC
  Filled 2021-11-07: qty 20

## 2021-11-07 MED ORDER — DOCUSATE SODIUM 100 MG PO CAPS
200.0000 mg | ORAL_CAPSULE | Freq: Every day | ORAL | Status: DC
  Administered 2021-11-08 – 2021-11-14 (×4): 200 mg via ORAL
  Filled 2021-11-07 (×8): qty 2

## 2021-11-07 MED ORDER — DEXMEDETOMIDINE HCL IN NACL 400 MCG/100ML IV SOLN
0.0000 ug/kg/h | INTRAVENOUS | Status: DC
  Administered 2021-11-07: 0.4 ug/kg/h via INTRAVENOUS
  Filled 2021-11-07: qty 100

## 2021-11-07 MED ORDER — ALBUMIN HUMAN 5 % IV SOLN
250.0000 mL | INTRAVENOUS | Status: AC | PRN
  Administered 2021-11-07 (×3): 12.5 g via INTRAVENOUS
  Filled 2021-11-07: qty 250

## 2021-11-07 MED ORDER — HEPARIN SODIUM (PORCINE) 1000 UNIT/ML IJ SOLN
INTRAMUSCULAR | Status: AC
  Filled 2021-11-07: qty 10

## 2021-11-07 MED ORDER — TRANEXAMIC ACID 1000 MG/10ML IV SOLN
1.5000 mg/kg/h | INTRAVENOUS | Status: DC
  Filled 2021-11-07: qty 25

## 2021-11-07 MED ORDER — ALBUMIN HUMAN 5 % IV SOLN
INTRAVENOUS | Status: DC | PRN
Start: 2021-11-07 — End: 2021-11-07

## 2021-11-07 MED ORDER — SODIUM CHLORIDE (PF) 0.9 % IJ SOLN
OROMUCOSAL | Status: DC | PRN
  Administered 2021-11-07 (×2): 4 mL via TOPICAL

## 2021-11-07 MED ORDER — MILRINONE LACTATE IN DEXTROSE 20-5 MG/100ML-% IV SOLN: 0.3000 ug/kg/min | INTRAVENOUS | Status: DC

## 2021-11-07 MED ORDER — VANCOMYCIN HCL IN DEXTROSE 1-5 GM/200ML-% IV SOLN
1000.0000 mg | Freq: Once | INTRAVENOUS | Status: AC
  Administered 2021-11-07: 1000 mg via INTRAVENOUS
  Filled 2021-11-07: qty 200

## 2021-11-07 MED ORDER — HEPARIN SODIUM (PORCINE) 1000 UNIT/ML IJ SOLN
INTRAMUSCULAR | Status: DC | PRN
  Administered 2021-11-07: 40000 [IU] via INTRAVENOUS

## 2021-11-07 MED ORDER — BISACODYL 10 MG RE SUPP: 10.0000 mg | Freq: Every day | RECTAL | Status: DC

## 2021-11-07 MED ORDER — MIDAZOLAM HCL 2 MG/2ML IJ SOLN: 2.0000 mg | INTRAMUSCULAR | Status: DC | PRN

## 2021-11-07 MED ORDER — ROCURONIUM BROMIDE 10 MG/ML (PF) SYRINGE
PREFILLED_SYRINGE | INTRAVENOUS | Status: AC
  Filled 2021-11-07: qty 10

## 2021-11-07 MED ORDER — CHLORHEXIDINE GLUCONATE CLOTH 2 % EX PADS
6.0000 | MEDICATED_PAD | Freq: Every day | CUTANEOUS | Status: DC
  Administered 2021-11-07 – 2021-11-13 (×7): 6 via TOPICAL

## 2021-11-07 MED ORDER — PROPOFOL 10 MG/ML IV BOLUS
INTRAVENOUS | Status: DC | PRN
Start: 2021-11-07 — End: 2021-11-07
  Administered 2021-11-07: 30 mg via INTRAVENOUS
  Administered 2021-11-07: 60 mg via INTRAVENOUS

## 2021-11-07 MED ORDER — HYDROCODONE-ACETAMINOPHEN 5-325 MG PO TABS
1.0000 | ORAL_TABLET | ORAL | Status: DC | PRN
  Administered 2021-11-07 – 2021-11-16 (×14): 1 via ORAL
  Filled 2021-11-07 (×14): qty 1

## 2021-11-07 MED ORDER — ~~LOC~~ CARDIAC SURGERY, PATIENT & FAMILY EDUCATION
Freq: Once | Status: DC
  Filled 2021-11-07: qty 1

## 2021-11-07 MED ORDER — INSULIN REGULAR(HUMAN) IN NACL 100-0.9 UT/100ML-% IV SOLN: INTRAVENOUS | Status: DC

## 2021-11-07 MED ORDER — SODIUM CHLORIDE 0.9 % IV SOLN: INTRAVENOUS | Status: DC

## 2021-11-07 MED ORDER — PANTOPRAZOLE SODIUM 40 MG PO TBEC: 40.0000 mg | DELAYED_RELEASE_TABLET | Freq: Every day | ORAL | Status: DC

## 2021-11-07 MED ORDER — HEPARIN SODIUM (PORCINE) 1000 UNIT/ML IJ SOLN
INTRAMUSCULAR | Status: AC
  Filled 2021-11-07: qty 1

## 2021-11-07 MED ORDER — ASPIRIN 81 MG PO CHEW
324.0000 mg | CHEWABLE_TABLET | Freq: Every day | ORAL | Status: DC
  Administered 2021-11-08: 324 mg
  Filled 2021-11-07: qty 4

## 2021-11-07 MED ORDER — CHLORHEXIDINE GLUCONATE 0.12 % MT SOLN
15.0000 mL | OROMUCOSAL | Status: AC
  Administered 2021-11-07: 15 mL via OROMUCOSAL

## 2021-11-07 MED ORDER — MAGNESIUM SULFATE 4 GM/100ML IV SOLN
4.0000 g | Freq: Once | INTRAVENOUS | Status: AC
  Administered 2021-11-07: 4 g via INTRAVENOUS
  Filled 2021-11-07: qty 100

## 2021-11-07 MED ORDER — ONDANSETRON HCL 4 MG/2ML IJ SOLN
INTRAMUSCULAR | Status: DC | PRN
  Administered 2021-11-07: 4 mg via INTRAVENOUS

## 2021-11-07 MED ORDER — SODIUM CHLORIDE 0.45 % IV SOLN: INTRAVENOUS | Status: DC | PRN

## 2021-11-07 MED ORDER — SODIUM CHLORIDE 0.9% FLUSH: 3.0000 mL | INTRAVENOUS | Status: DC | PRN

## 2021-11-07 MED ORDER — PROTAMINE SULFATE 10 MG/ML IV SOLN
INTRAVENOUS | Status: DC | PRN
Start: 2021-11-07 — End: 2021-11-07
  Administered 2021-11-07: 400 mg via INTRAVENOUS

## 2021-11-07 MED ORDER — POTASSIUM CHLORIDE 10 MEQ/50ML IV SOLN: 10.0000 meq | INTRAVENOUS | Status: AC

## 2021-11-07 MED ORDER — FENTANYL CITRATE (PF) 250 MCG/5ML IJ SOLN
INTRAMUSCULAR | Status: DC | PRN
  Administered 2021-11-07: 50 ug via INTRAVENOUS
  Administered 2021-11-07: 200 ug via INTRAVENOUS
  Administered 2021-11-07: 500 ug via INTRAVENOUS
  Administered 2021-11-07: 50 ug via INTRAVENOUS
  Administered 2021-11-07: 100 ug via INTRAVENOUS

## 2021-11-07 MED ORDER — TRAMADOL HCL 50 MG PO TABS
50.0000 mg | ORAL_TABLET | ORAL | Status: DC | PRN
  Administered 2021-11-09: 50 mg via ORAL
  Administered 2021-11-09: 100 mg via ORAL
  Administered 2021-11-10 – 2021-11-14 (×3): 50 mg via ORAL
  Administered 2021-11-15: 100 mg via ORAL
  Filled 2021-11-07: qty 2
  Filled 2021-11-07 (×4): qty 1
  Filled 2021-11-07: qty 2

## 2021-11-07 MED ORDER — ORAL CARE MOUTH RINSE: 15.0000 mL | Freq: Once | OROMUCOSAL | Status: AC

## 2021-11-07 MED ORDER — NOREPINEPHRINE 4 MG/250ML-% IV SOLN: 0.0000 ug/min | INTRAVENOUS | Status: DC

## 2021-11-07 MED ORDER — FLUTICASONE PROPIONATE 50 MCG/ACT NA SUSP
2.0000 | Freq: Every day | NASAL | Status: DC | PRN
  Administered 2021-11-15: 2 via NASAL
  Filled 2021-11-07: qty 16

## 2021-11-07 MED ORDER — METOPROLOL TARTRATE 25 MG/10 ML ORAL SUSPENSION: 12.5000 mg | Freq: Two times a day (BID) | ORAL | Status: DC

## 2021-11-07 MED ORDER — CHLORHEXIDINE GLUCONATE 0.12 % MT SOLN: 15.0000 mL | Freq: Once | OROMUCOSAL | Status: DC

## 2021-11-07 MED ORDER — PANTOPRAZOLE SODIUM 40 MG PO TBEC
40.0000 mg | DELAYED_RELEASE_TABLET | Freq: Every day | ORAL | Status: DC
  Administered 2021-11-09 – 2021-11-16 (×8): 40 mg via ORAL
  Filled 2021-11-07 (×8): qty 1

## 2021-11-07 SURGICAL SUPPLY — 84 items
ADAPTER CARDIO PERF ANTE/RETRO (ADAPTER) ×2 IMPLANT
BAG DECANTER FOR FLEXI CONT (MISCELLANEOUS) ×2 IMPLANT
BINDER BREAST XXLRG (GAUZE/BANDAGES/DRESSINGS) ×1 IMPLANT
BLADE CLIPPER SURG (BLADE) ×2 IMPLANT
BLADE STERNUM SYSTEM 6 (BLADE) ×3 IMPLANT
BLADE SURG 15 STRL LF DISP TIS (BLADE) ×2 IMPLANT
BLADE SURG 15 STRL SS (BLADE)
CANISTER SUCT 3000ML PPV (MISCELLANEOUS) ×3 IMPLANT
CANNULA GUNDRY RCSP 15FR (MISCELLANEOUS) ×3 IMPLANT
CANNULA NON VENT 20FR 12 (CANNULA) ×3 IMPLANT
CANNULA NON VENT 22FR 12 (CANNULA) ×1 IMPLANT
CANNULA SUMP PERICARDIAL (CANNULA) ×1 IMPLANT
CATH CPB KIT HENDRICKSON (MISCELLANEOUS) ×3 IMPLANT
CATH HEART VENT LEFT (CATHETERS) ×2 IMPLANT
CATH ROBINSON RED A/P 18FR (CATHETERS) ×9 IMPLANT
CNTNR URN SCR LID CUP LEK RST (MISCELLANEOUS) ×2 IMPLANT
CONN ST 1/2X1/2  BEN (MISCELLANEOUS)
CONN ST 1/2X1/2 BEN (MISCELLANEOUS) ×2 IMPLANT
CONN ST 1/4X3/8  BEN (MISCELLANEOUS) ×2
CONN ST 1/4X3/8 BEN (MISCELLANEOUS) ×4 IMPLANT
CONNECTOR BLAKE 2:1 CARIO BLK (MISCELLANEOUS) ×1 IMPLANT
CONT SPEC 4OZ STRL OR WHT (MISCELLANEOUS) ×1
CONTAINER PROTECT SURGISLUSH (MISCELLANEOUS) ×6 IMPLANT
COVER SURGICAL LIGHT HANDLE (MISCELLANEOUS) ×2 IMPLANT
DEVICE SUT CK QUICK LOAD MINI (Prosthesis & Implant Heart) ×1 IMPLANT
DRAIN CHANNEL 28F RND 3/8 FF (WOUND CARE) ×6 IMPLANT
DRAPE CARDIOVASCULAR INCISE (DRAPES) ×1
DRAPE SRG 135X102X78XABS (DRAPES) ×2 IMPLANT
DRAPE WARM FLUID 44X44 (DRAPES) ×3 IMPLANT
DRSG AQUACEL AG ADV 3.5X10 (GAUZE/BANDAGES/DRESSINGS) ×1 IMPLANT
DRSG COVADERM 4X14 (GAUZE/BANDAGES/DRESSINGS) ×3 IMPLANT
ELECT CAUTERY BLADE 6.4 (BLADE) ×3 IMPLANT
ELECT REM PT RETURN 9FT ADLT (ELECTROSURGICAL) ×3
ELECTRODE REM PT RTRN 9FT ADLT (ELECTROSURGICAL) ×4 IMPLANT
FELT TEFLON 1X6 (MISCELLANEOUS) ×5 IMPLANT
GAUZE 4X4 16PLY ~~LOC~~+RFID DBL (SPONGE) ×3 IMPLANT
GAUZE SPONGE 4X4 12PLY STRL (GAUZE/BANDAGES/DRESSINGS) ×3 IMPLANT
GAUZE SPONGE 4X4 12PLY STRL LF (GAUZE/BANDAGES/DRESSINGS) ×1 IMPLANT
GLOVE EUDERMIC 7 POWDERFREE (GLOVE) ×4 IMPLANT
GLOVE SURG ENC MOIS LTX SZ6 (GLOVE) IMPLANT
GLOVE SURG ENC MOIS LTX SZ6.5 (GLOVE) IMPLANT
GLOVE SURG ENC MOIS LTX SZ7 (GLOVE) ×6 IMPLANT
GLOVE SURG ENC MOIS LTX SZ7.5 (GLOVE) ×2 IMPLANT
GOWN STRL REUS W/ TWL LRG LVL3 (GOWN DISPOSABLE) ×8 IMPLANT
GOWN STRL REUS W/ TWL XL LVL3 (GOWN DISPOSABLE) ×4 IMPLANT
GOWN STRL REUS W/TWL LRG LVL3 (GOWN DISPOSABLE) ×10
GOWN STRL REUS W/TWL XL LVL3 (GOWN DISPOSABLE) ×1
HEMOSTAT POWDER SURGIFOAM 1G (HEMOSTASIS) ×8 IMPLANT
HEMOSTAT SURGICEL 2X14 (HEMOSTASIS) ×2 IMPLANT
INSERT FOGARTY XLG (MISCELLANEOUS) ×1 IMPLANT
INSERT SUTURE HOLDER (MISCELLANEOUS) ×3 IMPLANT
KIT BASIN OR (CUSTOM PROCEDURE TRAY) ×3 IMPLANT
KIT SUCTION CATH 14FR (SUCTIONS) ×3 IMPLANT
KIT SUT CK MINI COMBO 4X17 (Prosthesis & Implant Heart) ×1 IMPLANT
KIT TURNOVER KIT B (KITS) ×3 IMPLANT
LEAD PACING MYOCARDI (MISCELLANEOUS) ×2 IMPLANT
LINE VENT (MISCELLANEOUS) ×1 IMPLANT
NS IRRIG 1000ML POUR BTL (IV SOLUTION) ×18 IMPLANT
PACK E OPEN HEART (SUTURE) ×3 IMPLANT
PACK OPEN HEART (CUSTOM PROCEDURE TRAY) ×3 IMPLANT
PAD ARMBOARD 7.5X6 YLW CONV (MISCELLANEOUS) ×6 IMPLANT
POSITIONER HEAD DONUT 9IN (MISCELLANEOUS) ×3 IMPLANT
SET MPS 3-ND DEL (MISCELLANEOUS) ×1 IMPLANT
SPONGE T-LAP 18X18 ~~LOC~~+RFID (SPONGE) ×12 IMPLANT
SUT BONE WAX W31G (SUTURE) ×3 IMPLANT
SUT EB EXC GRN/WHT 2-0 V-5 (SUTURE) ×8 IMPLANT
SUT ETHIBOND X763 2 0 SH 1 (SUTURE) ×3 IMPLANT
SUT PROLENE 3 0 SH DA (SUTURE) IMPLANT
SUT PROLENE 3 0 SH1 36 (SUTURE) ×2 IMPLANT
SUT PROLENE 4 0 RB 1 (SUTURE) ×4
SUT PROLENE 4-0 RB1 .5 CRCL 36 (SUTURE) ×6 IMPLANT
SUT STEEL 6MS V (SUTURE) IMPLANT
SUT VIC AB 1 CTX 36 (SUTURE) ×1
SUT VIC AB 1 CTX36XBRD ANBCTR (SUTURE) IMPLANT
SYSTEM SAHARA CHEST DRAIN ATS (WOUND CARE) ×3 IMPLANT
TAPE CLOTH SURG 4X10 WHT LF (GAUZE/BANDAGES/DRESSINGS) ×1 IMPLANT
TOWEL GREEN STERILE (TOWEL DISPOSABLE) ×3 IMPLANT
TOWEL GREEN STERILE FF (TOWEL DISPOSABLE) ×3 IMPLANT
TRAY FOLEY SLVR 14FR TEMP STAT (SET/KITS/TRAYS/PACK) ×1 IMPLANT
TRAY FOLEY SLVR 16FR TEMP STAT (SET/KITS/TRAYS/PACK) ×2 IMPLANT
UNDERPAD 30X36 HEAVY ABSORB (UNDERPADS AND DIAPERS) ×3 IMPLANT
VALVE ON-X AORTIC 23MM (Prosthesis & Implant Heart) ×1 IMPLANT
VENT LEFT HEART 12002 (CATHETERS) ×6
WATER STERILE IRR 1000ML POUR (IV SOLUTION) ×6 IMPLANT

## 2021-11-07 NOTE — Transfer of Care (Signed)
Immediate Anesthesia Transfer of Care Note  Patient: Mary Chambers  Procedure(s) Performed: AORTIC VALVE REPLACEMENT USING A 23MM On-X AORTIC VALVE. (Chest) TRANSESOPHAGEAL ECHOCARDIOGRAM (TEE) (Esophagus)  Patient Location: ICU  Anesthesia Type:General  Level of Consciousness: sedated and Patient remains intubated per anesthesia plan  Airway & Oxygen Therapy: Patient remains intubated per anesthesia plan and Patient placed on Ventilator (see vital sign flow sheet for setting)  Post-op Assessment: Report given to RN and Post -op Vital signs reviewed and stable  Post vital signs: Reviewed and stable  Last Vitals:  Vitals Value Taken Time  BP    Temp    Pulse 76 11/07/21 1245  Resp 16 11/07/21 1245  SpO2 95 % 11/07/21 1245  Vitals shown include unvalidated device data.  Last Pain:  Vitals:   11/07/21 0638  TempSrc:   PainSc: 6       Patients Stated Pain Goal: 1 (59/74/16 3845)  Complications: No notable events documented.

## 2021-11-07 NOTE — Progress Notes (Signed)
CT surgery PM rounds  Patient hemodynamically stable and extubated after AVR A paced at 80 sinus rhythm underneath Next extubation gas satisfactory, hemoglobin 8.4 Continue current care  Blood pressure 95/61, pulse 69, temperature 98.1 F (36.7 C), resp. rate 17, height 5\' 7"  (1.702 m), weight 115.2 kg, SpO2 100 %.

## 2021-11-07 NOTE — Procedures (Signed)
Extubation Procedure Note  Patient Details:   Name: Mary Chambers DOB: Jun 06, 1971 MRN: [id number]   Airway Documentation:    Vent end date: 11/07/21 Vent end time: 1651   Evaluation  O2 sats: stable throughout Complications: No apparent complications Patient did tolerate procedure well. Bilateral Breath Sounds: Clear, Diminished   Yes, pt could speak post extubation.  Pt extubated to 4 l/m [place] without difficulty per protocol.  Earney Navy 11/07/2021, 4:53 PM

## 2021-11-07 NOTE — Anesthesia Postprocedure Evaluation (Signed)
Anesthesia Post Note  Patient: Mary Chambers  Procedure(s) Performed: AORTIC VALVE REPLACEMENT USING A 23MM On-X AORTIC VALVE. (Chest) TRANSESOPHAGEAL ECHOCARDIOGRAM (TEE) (Esophagus)     Patient location during evaluation: PACU Anesthesia Type: General Level of consciousness: sedated and patient remains intubated per anesthesia plan Pain management: pain level controlled Vital Signs Assessment: post-procedure vital signs reviewed and stable Respiratory status: patient on ventilator - see flowsheet for VS Cardiovascular status: stable Anesthetic complications: no   No notable events documented.  Last Vitals:  Vitals:   11/07/21 1730 11/07/21 1745  BP: 92/63 95/61  Pulse: 71 69  Resp: 16 17  Temp:    SpO2: 99% 100%    Last Pain:  Vitals:   11/07/21 0638  TempSrc:   PainSc: [place]

## 2021-11-07 NOTE — Interval H&P Note (Signed)
History and Physical Interval Note:  11/07/2021 7:30 AM  Mary Chambers  has presented today for surgery, with the diagnosis of SEVERE AI.  The various methods of treatment have been discussed with the patient and family. After consideration of risks, benefits and other options for treatment, the patient has consented to  Procedure(s): AORTIC VALVE REPLACEMENT (AVR) (N/A) TRANSESOPHAGEAL ECHOCARDIOGRAM (TEE) (N/A) as a surgical intervention.  The patient's history has been reviewed, patient examined, no change in status, stable for surgery.  I have reviewed the patient's chart and labs.  Questions were answered to the patient's satisfaction.     Darcy Barbara Bary Leriche

## 2021-11-07 NOTE — Anesthesia Procedure Notes (Signed)
Arterial Line Insertion Start/End2/03/2022 6:50 AM, 11/07/2021 7:00 AM Performed by: Colin Benton, CRNA, CRNA  Patient location: Pre-op. Preanesthetic checklist: patient identified, IV checked, site marked, risks and benefits discussed, surgical consent, monitors and equipment checked, pre-op evaluation, timeout performed and anesthesia consent Lidocaine 1% used for infiltration and patient sedated Left, radial was placed Catheter size: 20 G Hand hygiene performed , maximum sterile barriers used  and Seldinger technique used Allen's test indicative of satisfactory collateral circulation Attempts: 2 Procedure performed without using ultrasound guided technique. Following insertion, dressing applied and Biopatch. Post procedure assessment: normal and unchanged  Patient tolerated the procedure well with no immediate complications.

## 2021-11-07 NOTE — Anesthesia Procedure Notes (Signed)
Procedure Name: Intubation Date/Time: 11/07/2021 7:49 AM Performed by: Colin Benton, CRNA Pre-anesthesia Checklist: Patient identified, Emergency Drugs available, Suction available and Patient being monitored Patient Re-evaluated:Patient Re-evaluated prior to induction Oxygen Delivery Method: Circle system utilized Preoxygenation: Pre-oxygenation with 100% oxygen Induction Type: IV induction Ventilation: Mask ventilation without difficulty and Oral airway inserted - appropriate to patient size Laryngoscope Size: Sabra Heck and 2 Grade View: Grade II Tube type: Oral Tube size: 7.5 mm Number of attempts: 1 Airway Equipment and Method: Stylet and Oral airway Placement Confirmation: ETT inserted through vocal cords under direct vision, positive ETCO2 and breath sounds checked- equal and bilateral Secured at: 22 cm Tube secured with: Tape Dental Injury: Teeth and Oropharynx as per pre-operative assessment

## 2021-11-07 NOTE — Brief Op Note (Signed)
11/07/2021  7:21 AM  PATIENT:  Mary Chambers  51 y.o. female  PRE-OPERATIVE DIAGNOSIS:  SEVERE AI  POST-OPERATIVE DIAGNOSIS:  SEVERE AI  PROCEDURE:  Procedure(s):  AORTIC VALVE REPLACEMENT   23 mm  On-X MECHANICAL AORTIC VALVE. (N/A)  TRANSESOPHAGEAL ECHOCARDIOGRAM (TEE) (N/A)  SURGEON:  Surgeon(s) and Role:    * Lightfoot, Lucile Crater, MD - Primary  PHYSICIAN ASSISTANT: Ellwood Handler PA-C  ASSISTANTS: Dineen Kid RNFA   ANESTHESIA:   general  EBL:  2970 mL   BLOOD ADMINISTERED:   CC CELLSAVER  DRAINS:  Mediastinal Chest tube    LOCAL MEDICATIONS USED:  NONE  SPECIMEN:  Source of Specimen:  Aortic Valve Leaflets  DISPOSITION OF SPECIMEN:  PATHOLOGY  COUNTS:  YES  TOURNIQUET:  * No tourniquets in log *  DICTATION: .Dragon Dictation  PLAN OF CARE: Admit to inpatient   PATIENT DISPOSITION:  ICU - intubated and hemodynamically stable.   Delay start of Pharmacological VTE agent (>24hrs) due to surgical blood loss or risk of bleeding: yes

## 2021-11-07 NOTE — Hospital Course (Addendum)
History of Present Illness:  Mary Chambers is a 51 yo obese female with history of Aortic Valve Endocarditis. This was diagnosed in September of last year.  She was evaluated by Dr. Kipp Brood in January at which time she continued to have shortness of breath with minimal activity.  Repeat Echocardiogram was obtained and showed severe AR with a bicuspid aortic valve.  It was felt she would require aortic valve replacement.  The risks and benefits of the procedure were explained to the patient and she was agreeable to proceed.  She underwent dental extractions prior to surgery.  Of note, she presented to the ED on 1/23 with complaints of right sided lower back pain with radiation down her leg.  She was recently admitted in September with similar issues and was found to have a spinal abscess which had to be drained.  The patient is currently using a cane due to pain associated with mobility.  ED workup consisted of MRI which showed no evidence of infection, but there was a synovial cyst.  Neurosurgery was contacted and they recommended patient be discharged with a Medrol dose pack and follow up with them in a week.  Hospital Course:  Mary Chambers presented to [facility] on 11/07/2021.  She was taken to the operating room and underwent Aortic Valve Replacement with a 23 mm ON-X Mechanical Aortic Valve.  She tolerated the procedure without difficulty and was taken to the SICU in stable condition.  The patient was extubated without difficulty the evening of surgery.  She did not require inotropic support post operatively.  She was started on coumadin therapy for her mechanical AVR.  Her pacing wires, chest tubes, and arterial lines were removed without difficulty.   She was maintaining Sinus Tachycardia and her Lopressor dose was titrated accordingly.  She was volume overloaded and started on Lasix.  The patient was stable for transfer to the progressive care unit on 11/10/2021.  Her hemoglobin level has slowly  trended down, likely related to post operative blood loss anemia.  She was started on iron therapy.  She remains on coumadin for her Mechanical AVR.  Her most recent INR 2.5 is and she will be discharged on 5 mg of coumadin.  She has been mildly hypertensive tachycardic and her Lopressor dose was increased.  Unfortunately she developed orthostasis and was unable to tolerate the increased dose.  She had diuresed well and will be placed on her home regimen of Lasix.  She is ambulating without difficulty.  Her surgical incisions are healing without evidence of infection.  She is medically stable for discharge home today.

## 2021-11-07 NOTE — Progress Notes (Signed)
°  Echocardiogram Echocardiogram Transesophageal has been performed.  Bobbye Charleston 11/07/2021, 9:20 AM

## 2021-11-07 NOTE — Progress Notes (Signed)
°  Transition of Care [facility]) Screening Note   Patient Details  Name: Mary Chambers Date of Birth: 1971-01-30   Transition of Care [facility]) CM/SW Contact:    Milas Gain, [place] Phone Number: 11/07/2021, 4:29 PM    Transition of Care Department [facility]) has reviewed patient and no TOC needs have been identified at this time. We will continue to monitor patient advancement through interdisciplinary progression rounds. If new patient transition needs arise, please place a TOC consult.

## 2021-11-07 NOTE — Op Note (Signed)
[address]       [place] [address]             [phone]                                           11/07/2021 Patient:  Meron Bocchino Pre-Op Dx: Hx of aortic valve endocarditis Severe aortic insufficiency Biscuspid aortic valve   Post-op Dx:  same Procedure: Aortic valve replacement with a 22mm OnX valve     Surgeon and Role:      * Zephyr Ridley, Lucile Crater, MD - Primary    * E Barrett, PA-C -  An experienced assistant was required given the complexity of this surgery and the standard of surgical care. The assistant was needed for exposure, dissection, suctioning, retraction of delicate tissues and sutures, instrument exchange and for overall help during this procedure.    Anesthesia  general EBL:  500 ml Blood Administration: none Xclamp Time:  84 min Pump Time:  195min  Drains: 63 F blake drain x 2 mediastinal  Wires: a/v Counts: correct   Indications: 51 year old female with history of aortic valve endocarditis in the setting of septic spinal abscess.  She is scheduled to undergo dental extractions getting February.  We will repeat her echo her left heart function.  She is agreeable to proceed with a mechanical aortic valve.   Findings: Thickened leaflets.  Fusion of the left/right leaflets.    Operative Technique: All invasive lines were placed in pre-op holding.  After the risks, benefits and alternatives were thoroughly discussed, the patient was brought to the operative theatre.  Anesthesia was induced, and the patient was prepped and draped in normal sterile fashion.  An appropriate surgical pause was performed, and pre-operative antibiotics were dosed accordingly.  We began with an incision over the chest for the sternotomy.  This was carried down with bovie cautery, and the sternum was divided with a reciprocating saw.  Meticulous hemostasis was obtained.  The patient was systemically heparinized.   The sternal retractor was  placed.  The pericardium was divided in the midline and fashioned into a cradle with pericardial stitches.   After we confirmed an appropriate ACT, the ascending aorta was cannulated in standard fashion.  The right atrial appendage was used for venous cannulation site.  Cardiopulmonary bypass was initiated and we began to cool the patient to 32 degrees. The cross clamp was applied, and a dose of anterograde cardioplegia was given with good arrest of the heart.  Our aortotomy was made and directed toward the non coronary cusp.  The valve was inspected.  The leaflets were thickened.  The valve was bicuspid  All leaflets were excised.  The annulus was sized to a 25mm OnX valve.  The left ventricle was then copiously irrigated.  Pledgeted mattress sutures were placed circumferentially through the annulus.  These sutures were then passed through the sewing ring of the valve.  Once the valve was seated in the annulus, it was secured with Core-knot sutures.  We began to rewarm, and close our aortotomy in 2 layers.  A re-animation dose of cardioplegia was given.  After de-airing the heart, the aortic cross clamp was removed.  We checked our valve function, and for air using the TEE.  Once we were satisfying, we separated from cardiopulmonary bypass without event.  The heparin was reversed with protamine, and hemostasis was obtained.  Chest tubes and wires were placed, and the sternum was re-approximated with with sternal wires.  The soft tissue and skin were re-approximated wth absorbable suture.    The patient tolerated the procedure without any immediate complications, and was transferred to the ICU in guarded condition.  Dominque Marlin Bary Leriche

## 2021-11-07 NOTE — Anesthesia Procedure Notes (Signed)
Central Venous Catheter Insertion Performed by: Nolon Nations, MD, anesthesiologist Start/End2/03/2022 6:55 AM, 11/07/2021 7:15 AM Patient location: Pre-op. Preanesthetic checklist: patient identified, IV checked, site marked, risks and benefits discussed, surgical consent, monitors and equipment checked, pre-op evaluation, timeout performed and anesthesia consent Position: Trendelenburg Lidocaine 1% used for infiltration and patient sedated Hand hygiene performed  and maximum sterile barriers used  Catheter size: 9 Fr MAC introducer Procedure performed using ultrasound guided technique. Ultrasound Notes:anatomy identified, needle tip was noted to be adjacent to the nerve/plexus identified, no ultrasound evidence of intravascular and/or intraneural injection and image(s) printed for medical record Attempts: 1 Following insertion, line sutured, dressing applied and Biopatch. Post procedure assessment: blood return through all ports, free fluid flow and no air  Patient tolerated the procedure well with no immediate complications. Additional procedure comments: Triple lumen "SLIC" placed through introducer port.

## 2021-11-08 ENCOUNTER — Encounter (HOSPITAL_COMMUNITY): Payer: Self-pay | Admitting: Thoracic Surgery (Cardiothoracic Vascular Surgery)

## 2021-11-08 ENCOUNTER — Ambulatory Visit: Payer: Medicaid Other | Admitting: Podiatry

## 2021-11-08 ENCOUNTER — Inpatient Hospital Stay (HOSPITAL_COMMUNITY): Payer: 59

## 2021-11-08 LAB — POCT I-STAT 7, (LYTES, BLD GAS, ICA,H+H)
Acid-base deficit: 11 mmol/L — ABNORMAL HIGH (ref 0.0–2.0)
Bicarbonate: 14.9 mmol/L — ABNORMAL LOW (ref 20.0–28.0)
Calcium, Ion: 0.92 mmol/L — ABNORMAL LOW (ref 1.15–1.40)
HCT: 19 % — ABNORMAL LOW (ref 36.0–46.0)
Hemoglobin: 6.5 g/dL — CL (ref 12.0–15.0)
O2 Saturation: 99 %
Patient temperature: 98
Potassium: 3.3 mmol/L — ABNORMAL LOW (ref 3.5–5.1)
Sodium: 146 mmol/L — ABNORMAL HIGH (ref 135–145)
TCO2: 16 mmol/L — ABNORMAL LOW (ref 22–32)
pCO2 arterial: 32.7 mmHg (ref 32.0–48.0)
pH, Arterial: 7.265 — ABNORMAL LOW (ref 7.350–7.450)
pO2, Arterial: 134 mmHg — ABNORMAL HIGH (ref 83.0–108.0)

## 2021-11-08 LAB — BASIC METABOLIC PANEL
Anion gap: 5 (ref 5–15)
Anion gap: 8 (ref 5–15)
BUN: 16 mg/dL (ref 6–20)
BUN: 19 mg/dL (ref 6–20)
CO2: 25 mmol/L (ref 22–32)
CO2: 24 mmol/L (ref 22–32)
Calcium: 8 mg/dL — ABNORMAL LOW (ref 8.9–10.3)
Calcium: 8.1 mg/dL — ABNORMAL LOW (ref 8.9–10.3)
Chloride: 108 mmol/L (ref 98–111)
Chloride: 105 mmol/L (ref 98–111)
Creatinine, Ser: 0.99 mg/dL (ref 0.44–1.00)
Creatinine, Ser: 1.14 mg/dL — ABNORMAL HIGH (ref 0.44–1.00)
GFR, Estimated: >60 mL/min (ref >60)
GFR, Estimated: 58 mL/min — ABNORMAL LOW (ref >60)
Glucose, Bld: 138 mg/dL — ABNORMAL HIGH (ref 70–99)
Glucose, Bld: 146 mg/dL — ABNORMAL HIGH (ref 70–99)
Potassium: 4.7 mmol/L (ref 3.5–5.1)
Potassium: 4.1 mmol/L (ref 3.5–5.1)
Sodium: 138 mmol/L (ref 135–145)
Sodium: 137 mmol/L (ref 135–145)

## 2021-11-08 LAB — MAGNESIUM
Magnesium: 2.1 mg/dL (ref 1.7–2.4)
Magnesium: 2.1 mg/dL (ref 1.7–2.4)

## 2021-11-08 LAB — GLUCOSE, CAPILLARY
Glucose-Capillary: 106 mg/dL — ABNORMAL HIGH (ref 70–99)
Glucose-Capillary: 110 mg/dL — ABNORMAL HIGH (ref 70–99)
Glucose-Capillary: 113 mg/dL — ABNORMAL HIGH (ref 70–99)
Glucose-Capillary: 120 mg/dL — ABNORMAL HIGH (ref 70–99)
Glucose-Capillary: 124 mg/dL — ABNORMAL HIGH (ref 70–99)
Glucose-Capillary: 132 mg/dL — ABNORMAL HIGH (ref 70–99)
Glucose-Capillary: 133 mg/dL — ABNORMAL HIGH (ref 70–99)
Glucose-Capillary: 134 mg/dL — ABNORMAL HIGH (ref 70–99)
Glucose-Capillary: 136 mg/dL — ABNORMAL HIGH (ref 70–99)

## 2021-11-08 LAB — CBC
HCT: 26.9 % — ABNORMAL LOW (ref 36.0–46.0)
HCT: 27.9 % — ABNORMAL LOW (ref 36.0–46.0)
Hemoglobin: 8.5 g/dL — ABNORMAL LOW (ref 12.0–15.0)
Hemoglobin: 8.5 g/dL — ABNORMAL LOW (ref 12.0–15.0)
MCH: 26.9 pg (ref 26.0–34.0)
MCH: 26.1 pg (ref 26.0–34.0)
MCHC: 31.6 g/dL (ref 30.0–36.0)
MCHC: 30.5 g/dL (ref 30.0–36.0)
MCV: 85.1 fL (ref 80.0–100.0)
MCV: 85.6 fL (ref 80.0–100.0)
Platelets: 124 10*3/uL — ABNORMAL LOW (ref 150–400)
Platelets: 120 10*3/uL — ABNORMAL LOW (ref 150–400)
RBC: 3.16 MIL/uL — ABNORMAL LOW (ref 3.87–5.11)
RBC: 3.26 MIL/uL — ABNORMAL LOW (ref 3.87–5.11)
RDW: 14.8 % (ref 11.5–15.5)
RDW: 15.1 % (ref 11.5–15.5)
WBC: 14.8 10*3/uL — ABNORMAL HIGH (ref 4.0–10.5)
WBC: 13.6 10*3/uL — ABNORMAL HIGH (ref 4.0–10.5)
nRBC: 0 % (ref 0.0–0.2)
nRBC: 0 % (ref 0.0–0.2)

## 2021-11-08 LAB — SURGICAL PATHOLOGY

## 2021-11-08 IMAGING — DX DG CHEST 1V PORT
1 series · 1 of 1 positions shown · non-contrast
Comparison: [DATE].

CLINICAL DATA: Chest tube present status post open heart surgery.

EXAM:
PORTABLE CHEST 1 VIEW

[chest]
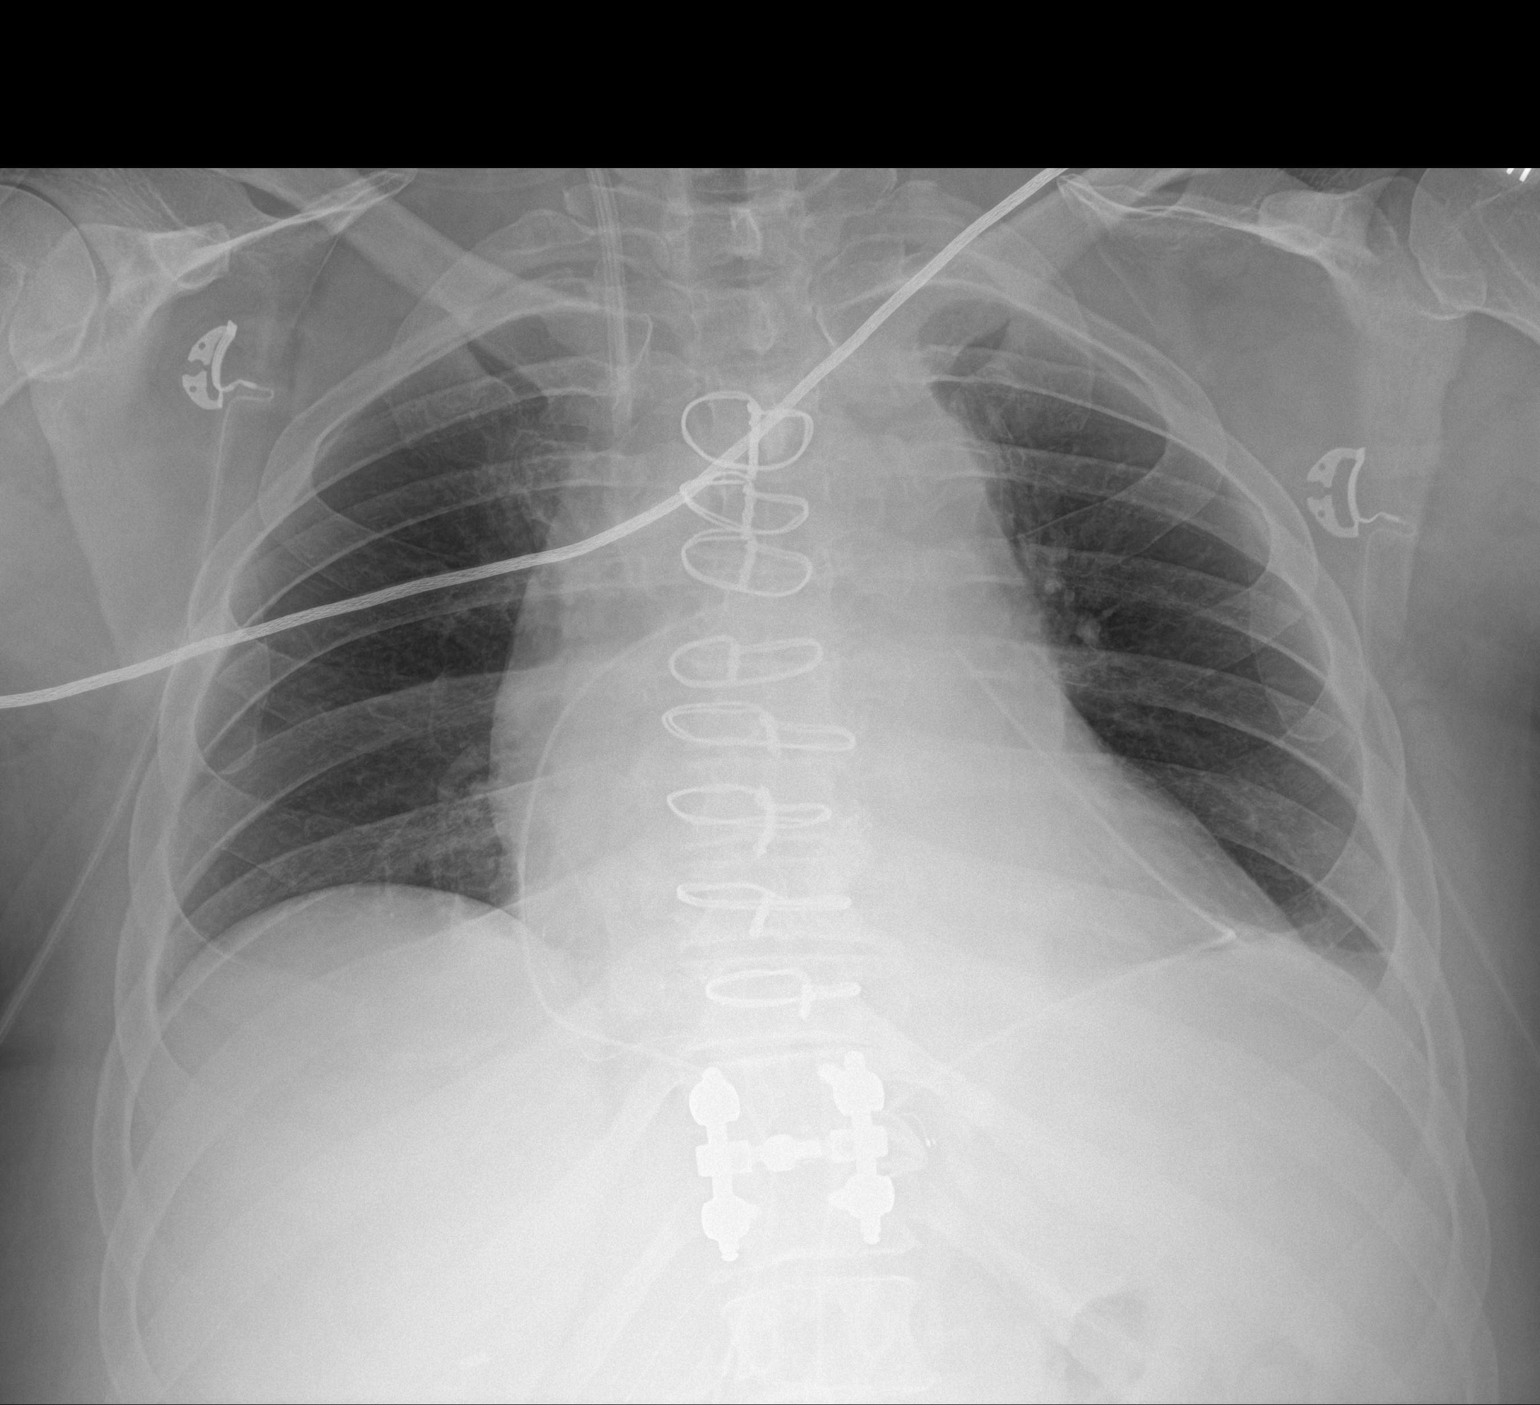

[1 of 1 positions shown; findings below may reference images not displayed]

FINDINGS: Interval extubation. Similar position of right IJ sheath.
Mediastinal drains. Low lung volumes without evidence of
consolidation. No visible pleural effusions pneumothorax. Similar
enlarged cardiomediastinal silhouette, likely accentuated by AP
technique and low lung volumes. Median sternotomy. Lower thoracic
and cervical fusion.
IMPRESSION: No evidence of acute cardiopulmonary disease.

## 2021-11-08 MED ORDER — SODIUM CHLORIDE 0.9% FLUSH
10.0000 mL | Freq: Two times a day (BID) | INTRAVENOUS | Status: DC
  Administered 2021-11-08 – 2021-11-16 (×12): 10 mL

## 2021-11-08 MED ORDER — INSULIN ASPART 100 UNIT/ML IJ SOLN
0.0000 [IU] | INTRAMUSCULAR | Status: DC
  Administered 2021-11-08: 2 [IU] via SUBCUTANEOUS

## 2021-11-08 MED ORDER — KETOROLAC TROMETHAMINE 30 MG/ML IJ SOLN
30.0000 mg | Freq: Four times a day (QID) | INTRAMUSCULAR | Status: AC
  Administered 2021-11-08 – 2021-11-12 (×19): 30 mg via INTRAVENOUS
  Filled 2021-11-08 (×20): qty 1

## 2021-11-08 MED ORDER — WARFARIN - PHYSICIAN DOSING INPATIENT: Freq: Every day | Status: DC

## 2021-11-08 MED ORDER — PREGABALIN 25 MG PO CAPS
25.0000 mg | ORAL_CAPSULE | Freq: Two times a day (BID) | ORAL | Status: DC
  Administered 2021-11-08 – 2021-11-16 (×17): 25 mg via ORAL
  Filled 2021-11-08 (×17): qty 1

## 2021-11-08 MED ORDER — METHOCARBAMOL 500 MG PO TABS
500.0000 mg | ORAL_TABLET | Freq: Three times a day (TID) | ORAL | Status: DC
  Administered 2021-11-08 – 2021-11-16 (×25): 500 mg via ORAL
  Filled 2021-11-08 (×25): qty 1

## 2021-11-08 MED ORDER — ENOXAPARIN SODIUM 40 MG/0.4ML IJ SOSY
40.0000 mg | PREFILLED_SYRINGE | Freq: Every day | INTRAMUSCULAR | Status: DC
  Administered 2021-11-08: 40 mg via SUBCUTANEOUS
  Filled 2021-11-08: qty 0.4

## 2021-11-08 MED ORDER — WARFARIN SODIUM 5 MG PO TABS
5.0000 mg | ORAL_TABLET | Freq: Every day | ORAL | Status: DC
  Administered 2021-11-08: 5 mg via ORAL
  Filled 2021-11-08: qty 1

## 2021-11-08 NOTE — Progress Notes (Signed)
° °   °  [address]       [place] [address]             [phone]                 1 Day Post-Op Procedure(s) (LRB): AORTIC VALVE REPLACEMENT USING A 23MM On-X AORTIC VALVE. (N/A) TRANSESOPHAGEAL ECHOCARDIOGRAM (TEE) (N/A)   Events: No events overnight extubated _______________________________________________________________ Vitals: BP 120/62 (BP Location: Right Wrist)    Pulse (!) 108    Temp 98.6 F (37 C) (Oral)    Resp 17    Ht 5\' 7"  (1.702 m)    Wt 121.3 kg    SpO2 95%    BMI 41.88 kg/m  Filed Weights   11/07/21 0548 11/08/21 0500  Weight: 115.2 kg 121.3 kg     - Neuro: alert NAD  - Cardiovascular: sinus  Drips: none.   CVP:  [2 mmHg-24 mmHg] 12 mmHg  - Pulm: EWOB  ABG    Component Value Date/Time   PHART 7.295 (L) 11/07/2021 1810   PCO2ART 46.4 11/07/2021 1810   PO2ART 135 (H) 11/07/2021 1810   HCO3 22.6 11/07/2021 1810   TCO2 24 11/07/2021 1810   ACIDBASEDEF 4.0 (H) 11/07/2021 1810   O2SAT 99.0 11/07/2021 1810    - Abd: ND - Extremity: warm  .Intake/Output      02/06 0701 02/07 0700 02/07 0701 02/08 0700   P.O. 400    I.V. (mL/kg) 2419.5 (19.9)    Blood 534    IV Piggyback 2265    Total Intake(mL/kg) 5618.5 (46.3)    Urine (mL/kg/hr) 2435 (0.8)    Blood 2970    Chest Tube 320    Total Output 5725    Net -106.5            _______________________________________________________________ Labs: CBC Latest Ref Rng & Units 11/08/2021 11/07/2021 11/07/2021  WBC 4.0 - 10.5 K/uL 14.8(H) 21.8(H) -  Hemoglobin 12.0 - 15.0 g/dL 8.5(L) 9.6(L) 9.9(L)  Hematocrit 36.0 - 46.0 % 26.9(L) 30.5(L) 29.0(L)  Platelets 150 - 400 K/uL 124(L) 140(L) -   CMP Latest Ref Rng & Units 11/08/2021 11/07/2021 11/07/2021  Glucose 70 - 99 mg/dL 138(H) 139(H) -  BUN 6 - 20 mg/dL 16 21(H) -  Creatinine 0.44 - 1.00 mg/dL 0.99 0.94 -  Sodium 135 - 145 mmol/L 138 136 140  Potassium 3.5 - 5.1 mmol/L 4.7 4.9 4.8  Chloride 98 - 111 mmol/L 108 106 -  CO2 22 - 32  mmol/L 25 23 -  Calcium 8.9 - 10.3 mg/dL 8.0(L) 7.8(L) -  Total Protein 6.5 - 8.1 g/dL - - -  Total Bilirubin 0.3 - 1.2 mg/dL - - -  Alkaline Phos 38 - 126 U/L - - -  AST 15 - 41 U/L - - -  ALT 0 - 44 U/L - - -    CXR: clear  _______________________________________________________________  Assessment and Plan: POD 1 s/p mAVR  Neuro: adjusting pain control CV: will remove a line.  Will titrated BP medication Pulm: pulm hyg Renal: creat stable GI: on diet Heme: starting coumadin today for mechanical valve ID: AF Endo: SSI Dispo: continue ICU care   Mary Chambers O Mary Chambers 11/08/2021 7:45 AM

## 2021-11-08 NOTE — Progress Notes (Signed)
° °   °  [address]       [place] [address]             [phone]      POD # 1 AVR  BP 103/78 (BP Location: Right Wrist)    Pulse 92    Temp 98.1 F (36.7 C) (Oral)    Resp 17    Ht 5\' 7"  (1.702 m)    Wt 121.3 kg    SpO2 96%    BMI 41.88 kg/m  2L [place] 94% sat  Intake/Output Summary (Last 24 hours) at 11/08/2021 1739 Last data filed at 11/08/2021 1700 Gross per 24 hour  Intake 1827.47 ml  Output 2010 ml  Net -182.53 ml   CBG well controlled PM labs pending  Remo Lipps C. Roxan Hockey, MD [facility] [phone]

## 2021-11-09 ENCOUNTER — Inpatient Hospital Stay (HOSPITAL_COMMUNITY): Payer: 59

## 2021-11-09 LAB — CBC
HCT: 25.3 % — ABNORMAL LOW (ref 36.0–46.0)
Hemoglobin: 7.8 g/dL — ABNORMAL LOW (ref 12.0–15.0)
MCH: 26.7 pg (ref 26.0–34.0)
MCHC: 30.8 g/dL (ref 30.0–36.0)
MCV: 86.6 fL (ref 80.0–100.0)
Platelets: 101 10*3/uL — ABNORMAL LOW (ref 150–400)
RBC: 2.92 MIL/uL — ABNORMAL LOW (ref 3.87–5.11)
RDW: 15.2 % (ref 11.5–15.5)
WBC: 12 10*3/uL — ABNORMAL HIGH (ref 4.0–10.5)
nRBC: 0 % (ref 0.0–0.2)

## 2021-11-09 LAB — BASIC METABOLIC PANEL
Anion gap: 8 (ref 5–15)
BUN: 15 mg/dL (ref 6–20)
CO2: 27 mmol/L (ref 22–32)
Calcium: 8.1 mg/dL — ABNORMAL LOW (ref 8.9–10.3)
Chloride: 103 mmol/L (ref 98–111)
Creatinine, Ser: 0.86 mg/dL (ref 0.44–1.00)
GFR, Estimated: >60 mL/min (ref >60)
Glucose, Bld: 104 mg/dL — ABNORMAL HIGH (ref 70–99)
Potassium: 4.2 mmol/L (ref 3.5–5.1)
Sodium: 138 mmol/L (ref 135–145)

## 2021-11-09 LAB — PROTIME-INR
INR: 1.9 — ABNORMAL HIGH (ref 0.8–1.2)
Prothrombin Time: 21.3 seconds — ABNORMAL HIGH (ref 11.4–15.2)

## 2021-11-09 LAB — GLUCOSE, CAPILLARY
Glucose-Capillary: 105 mg/dL — ABNORMAL HIGH (ref 70–99)
Glucose-Capillary: 150 mg/dL — ABNORMAL HIGH (ref 70–99)
Glucose-Capillary: 113 mg/dL — ABNORMAL HIGH (ref 70–99)
Glucose-Capillary: 96 mg/dL (ref 70–99)
Glucose-Capillary: 113 mg/dL — ABNORMAL HIGH (ref 70–99)

## 2021-11-09 MED ORDER — ~~LOC~~ CARDIAC SURGERY, PATIENT & FAMILY EDUCATION
Freq: Once | Status: AC
  Administered 2021-11-09: 1

## 2021-11-09 MED ORDER — POTASSIUM CHLORIDE CRYS ER 20 MEQ PO TBCR
40.0000 meq | EXTENDED_RELEASE_TABLET | Freq: Every day | ORAL | Status: DC
  Administered 2021-11-09 – 2021-11-12 (×4): 40 meq via ORAL
  Filled 2021-11-09 (×4): qty 2

## 2021-11-09 MED ORDER — ASPIRIN 81 MG PO CHEW: 81.0000 mg | CHEWABLE_TABLET | Freq: Every day | ORAL | Status: DC

## 2021-11-09 MED ORDER — FUROSEMIDE 40 MG PO TABS
40.0000 mg | ORAL_TABLET | Freq: Every day | ORAL | Status: DC
  Administered 2021-11-10 – 2021-11-12 (×3): 40 mg via ORAL
  Filled 2021-11-09 (×3): qty 1

## 2021-11-09 MED ORDER — METOPROLOL TARTRATE 25 MG PO TABS
25.0000 mg | ORAL_TABLET | Freq: Two times a day (BID) | ORAL | Status: DC
  Administered 2021-11-09 – 2021-11-14 (×12): 25 mg via ORAL
  Filled 2021-11-09 (×12): qty 1

## 2021-11-09 MED ORDER — WARFARIN SODIUM 2 MG PO TABS
2.0000 mg | ORAL_TABLET | Freq: Every day | ORAL | Status: DC
  Administered 2021-11-09: 2 mg via ORAL
  Filled 2021-11-09: qty 1

## 2021-11-09 MED ORDER — SODIUM CHLORIDE 0.9% FLUSH
3.0000 mL | Freq: Two times a day (BID) | INTRAVENOUS | Status: DC
  Administered 2021-11-09 (×2): 3 mL via INTRAVENOUS

## 2021-11-09 MED ORDER — ASPIRIN EC 81 MG PO TBEC
81.0000 mg | DELAYED_RELEASE_TABLET | Freq: Every day | ORAL | Status: DC
  Administered 2021-11-09 – 2021-11-16 (×8): 81 mg via ORAL
  Filled 2021-11-09 (×8): qty 1

## 2021-11-09 MED ORDER — FUROSEMIDE 10 MG/ML IJ SOLN
40.0000 mg | Freq: Once | INTRAMUSCULAR | Status: AC
  Administered 2021-11-09: 40 mg via INTRAVENOUS
  Filled 2021-11-09: qty 4

## 2021-11-09 MED ORDER — METOPROLOL TARTRATE 25 MG/10 ML ORAL SUSPENSION
25.0000 mg | Freq: Two times a day (BID) | ORAL | Status: DC
  Filled 2021-11-09: qty 10

## 2021-11-09 MED ORDER — INFLUENZA VAC SPLIT QUAD 0.5 ML IM SUSY
0.5000 mL | PREFILLED_SYRINGE | INTRAMUSCULAR | Status: AC
Start: 2021-11-10 — End: 2021-11-10
  Administered 2021-11-10: 0.5 mL via INTRAMUSCULAR
  Filled 2021-11-09: qty 0.5

## 2021-11-09 MED ORDER — INSULIN ASPART 100 UNIT/ML IJ SOLN: 0.0000 [IU] | Freq: Three times a day (TID) | INTRAMUSCULAR | Status: DC

## 2021-11-09 MED ORDER — SODIUM CHLORIDE 0.9% FLUSH: 3.0000 mL | INTRAVENOUS | Status: DC | PRN

## 2021-11-09 MED ORDER — INSULIN ASPART 100 UNIT/ML IJ SOLN
0.0000 [IU] | Freq: Three times a day (TID) | INTRAMUSCULAR | Status: DC
  Administered 2021-11-09: 2 [IU] via SUBCUTANEOUS

## 2021-11-09 MED ORDER — SODIUM CHLORIDE 0.9 % IV SOLN: 250.0000 mL | INTRAVENOUS | Status: DC | PRN

## 2021-11-09 MED FILL — Potassium Chloride Inj 2 mEq/ML: INTRAVENOUS | Qty: 40 | Status: AC

## 2021-11-09 MED FILL — Lidocaine HCl Local Preservative Free (PF) Inj 2%: INTRAMUSCULAR | Qty: 15 | Status: AC

## 2021-11-09 MED FILL — Heparin Sodium (Porcine) Inj 1000 Unit/ML: Qty: 1000 | Status: AC

## 2021-11-09 NOTE — Discharge Summary (Addendum)
[address]       [place] [address]             [phone]    Physician Discharge Summary  Patient ID: Mary Chambers MRN: [id number] DOB/AGE: 1971/06/03 51 y.o.  Admit date: 11/07/2021 Discharge date: 11/16/2021  Admission Diagnoses:  Patient Active Problem List   Diagnosis Date Noted   Severe aortic regurgitation 07/27/2021   Bicuspid aortic valve 07/27/2021   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   Paraspinal abscess ([place]) 07/26/2021   Vertebral osteomyelitis ([place])    Septic arthritis ([place]) 06/23/2021   Transaminitis 06/23/2021   Abscess 06/23/2021   COVID-19 virus infection 06/23/2021   Gastroesophageal reflux disease 12/15/2020   Screening for colorectal cancer 12/15/2020   History of gastric ulcer 12/15/2020   Herniated lumbar disc without myelopathy 10/02/2019   Lumbar spinal stenosis 10/01/2019   Pain in left foot 03/29/2018   Arthritis 02/13/2018   Chronic back pain 02/13/2018   Migraines 02/13/2018   Morbidly obese ([place]) 02/13/2018   Synovial cyst of lumbar spine 10/15/2017   Osteoarthritis of knee 07/13/2017   Lumbar disc disease with radiculopathy 08/01/2016   Headache 05/04/2016   Numbness of foot 05/04/2016   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   Arthrodesis status 02/02/2016   Chronic pain syndrome 02/02/2016   Lumbar stenosis with neurogenic claudication 05/07/2015   Bilateral leg edema 04/21/2015   Carpal tunnel syndrome of right wrist 12/14/2014   Renal atrophy, left 01/23/2014   Achilles tendonitis 01/16/2014   Right lower quadrant pain 12/18/2013   Hypertension, benign 06/03/2010   Urinary incontinence 06/03/2010   Cervical disc disease 09/09/2009   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)    Septic arthritis ([place]) 06/23/2021   Transaminitis 06/23/2021   Abscess 06/23/2021   COVID-19 virus infection 06/23/2021   Gastroesophageal reflux disease 12/15/2020   Screening for colorectal cancer 12/15/2020   History of gastric ulcer 12/15/2020   Herniated lumbar disc without myelopathy 10/02/2019   Lumbar spinal stenosis 10/01/2019   Pain in left foot 03/29/2018   Arthritis 02/13/2018   Chronic back pain 02/13/2018   Migraines 02/13/2018   Morbidly obese ([place]) 02/13/2018   Synovial cyst of lumbar spine 10/15/2017   Osteoarthritis of knee 07/13/2017   Lumbar disc disease with radiculopathy 08/01/2016   Headache 05/04/2016   Numbness of foot 05/04/2016   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   Arthrodesis status 02/02/2016   Chronic pain syndrome 02/02/2016   Lumbar stenosis with neurogenic claudication 05/07/2015   Bilateral leg edema 04/21/2015   Carpal tunnel syndrome of right wrist 12/14/2014   Renal atrophy, left 01/23/2014   Achilles tendonitis 01/16/2014   Right lower quadrant pain 12/18/2013   Hypertension, benign 06/03/2010   Urinary incontinence 06/03/2010   Cervical disc disease 09/09/2009   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 This was diagnosed in September of last year.  She was evaluated by Dr. Kipp Brood in January at which time she continued to have shortness of breath with minimal activity.  Repeat Echocardiogram was obtained and showed severe AR with a  bicuspid aortic valve.  It was felt she would require aortic valve replacement.  The risks and benefits of the procedure were explained to the patient and she was agreeable to proceed.  She underwent dental extractions prior to surgery.  Of note, she presented to the ED on 1/23 with complaints of right  sided lower back pain with radiation down her leg.  She was recently admitted in September with similar issues and was found to have a spinal abscess which had to be drained.  The patient is currently using a cane due to pain associated with mobility.  ED workup consisted of MRI which showed no evidence of infection, but there was a synovial cyst.  Neurosurgery was contacted and they recommended patient be discharged with a Medrol dose pack and follow up with them in a week.  Hospital Course:  Mary Chambers presented to [facility] on 11/07/2021.  She was taken to the operating room and underwent Aortic Valve Replacement with a 23 mm ON-X Mechanical Aortic Valve.  She tolerated the procedure without difficulty and was taken to the SICU in stable condition.  The patient was extubated without difficulty the evening of surgery.  She did not require inotropic support post operatively.  She was started on coumadin therapy for her mechanical AVR.  Her pacing wires, chest tubes, and arterial lines were removed without difficulty.   She was maintaining Sinus Tachycardia and her Lopressor dose was titrated accordingly.  She was volume overloaded and started on Lasix.  The patient was stable for transfer to the progressive care unit on 11/10/2021.  Her hemoglobin level has slowly trended down, likely related to post operative blood loss anemia.  She was started on iron therapy.  She remains on coumadin for her Mechanical AVR.  Her most recent INR 2.5 is and she will be discharged on 5 mg of coumadin.  She has been mildly hypertensive tachycardic and her Lopressor dose was increased.  Unfortunately she developed orthostasis and was unable to tolerate the increased dose.  She had diuresed well and will be placed on her home regimen of Lasix.  She is ambulating without difficulty.  Her surgical incisions are healing without evidence of infection.  She is medically stable for discharge home today.  Consults:  None  Significant Diagnostic Studies: cardiac graphics:    Echocardiogram:    Patient Name:   Mary Chambers Date of Exam: 11/04/2021  Medical Rec #:  [id number]                 Height:       67.0 in  Accession #:    [id number]                Weight:       254.0 lb  Date of Birth:  [birth year]                 BSA:          2.238 m  Patient Age:    36 years                  BP:           122/78 mmHg  Patient Gender: F                         HR:           85 bpm.  Exam Location:  Outpatient   Procedure:  2D Echo, Color Doppler and Cardiac Doppler   Indications:    Q23.1 Bicuspid aortic valve; I35.2 Nonrheumatic aortic  (valve)                  stenosis with insufficiency     History:        Patient has prior history of Echocardiogram examinations,  most                  recent 08/31/2021. Risk Factors:Hypertension.     Sonographer:    Raquel Sarna Senior RDCS  Referring Phys: [id number] [place]    1. Left ventricular ejection fraction, by estimation, is 55 to 60%. The  left ventricle has normal function. The left ventricle has no regional  wall motion abnormalities. The left ventricular internal cavity size was  mildly dilated. There is mild  concentric left ventricular hypertrophy. Left ventricular diastolic  parameters were normal.   2. Right ventricular systolic function is normal. The right ventricular  size is normal.   3. Left atrial size was mild to moderately dilated.   4. The mitral valve is grossly normal. Trivial mitral valve  regurgitation. No evidence of mitral stenosis.   5. Aortic valve not well visualized on current images but known to be  bicuspid from prior study. Severe eccentric AR, see image 41, jet fills  LVOT. The aortic valve was not well visualized. There is moderate  calcification of the aortic valve. Aortic  valve regurgitation is severe.   6. The inferior vena cava is normal in size with <50% respiratory  variability,  suggesting right atrial pressure of 8 mmHg.   Treatments: surgery:   11/07/2021 Patient:  Jearldean Gutt Pre-Op Dx: Hx of aortic valve endocarditis Severe aortic insufficiency Biscuspid aortic valve   Post-op Dx:  same Procedure: Aortic valve replacement with a 70mm OnX valve       Surgeon and Role:      * Lightfoot, Lucile Crater, MD - Primary    * E Cleon Thoma, PA-C -  An experienced assistant was required given the complexity of this surgery and the standard of surgical care. The assistant was needed for exposure, dissection, suctioning, retraction of delicate tissues and sutures, instrument exchange and for overall help during this procedure.    Discharge Exam: Blood pressure 106/69, pulse 97, temperature 98.3 F (36.8 C), temperature source Oral, resp. rate 18, height 5\' 7"  (1.702 m), weight 112.5 kg, SpO2 94 %.  General appearance: alert, cooperative, and no distress Heart: regular rate and rhythm Lungs: clear to auscultation bilaterally Abdomen: soft, non-tender; bowel sounds normal; no masses,  no organomegaly Extremities: edema trace Wound: clean and dry   Discharge Medications:  The patient has been discharged on:   1.Beta Blocker:  Yes [ X  ]                              No   [   ]                              If No, reason:  2.Ace Inhibitor/ARB: Yes [   ]                                     No  [ x   ]  If No, reason: titration of BB  3.Statin:   Yes [ X  ]                  No  [   ]                  If No, reason:  4.Ecasa:  Yes  [ X  ]                  No   [   ]                  If No, reason:  Patient had ACS upon admission:  Plavix/P2Y12 inhibitor: Yes [   ]                                      No  [ X  ]  Discharge Instructions     Amb Referral to Cardiac Rehabilitation   Complete by: As directed    Diagnosis: Valve Replacement   Valve: Aortic   After initial evaluation and assessments completed:  Virtual Based Care may be provided alone or in conjunction with Phase 2 Cardiac Rehab based on patient barriers.: Yes      Allergies as of 11/16/2021       Reactions   Baclofen Hives   Dilaudid [hydromorphone] Itching   Doxycycline Itching   Percocet [oxycodone-acetaminophen] Hives   Septra [sulfamethoxazole-trimethoprim] Hives   Gabapentin Rash        Medication List     STOP taking these medications    enalapril 10 MG tablet Commonly known as: VASOTEC   methylPREDNISolone 4 MG Tbpk tablet Commonly known as: MEDROL DOSEPAK   naproxen 500 MG tablet Commonly known as: Naprosyn   traMADol 50 MG tablet Commonly known as: ULTRAM       TAKE these medications    aspirin 81 MG EC tablet Take 1 tablet (81 mg total) by mouth daily. Swallow whole.   atorvastatin 10 MG tablet Commonly known as: LIPITOR Take 10 mg by mouth daily.   cholecalciferol 25 MCG (1000 UNIT) tablet Commonly known as: VITAMIN D3 Take 1,000 Units by mouth daily.   fluticasone 50 MCG/ACT nasal spray Commonly known as: FLONASE Place 2 sprays into both nostrils daily. What changed:  when to take this reasons to take this   furosemide 20 MG tablet Commonly known as: LASIX Take 1 tablet (20 mg total) by mouth daily as needed. For weight gain of 3 lbs in 1 day or 5 lbs in 2 What changed:  when to take this reasons to take this additional instructions   HYDROcodone-acetaminophen 5-325 MG tablet Commonly known as: NORCO/VICODIN Take 1 tablet by mouth every 4 (four) hours as needed for severe pain. What changed: when to take this   loperamide 2 MG capsule Commonly known as: IMODIUM Take 1 capsule (2 mg total) by mouth as needed for diarrhea or loose stools.   metoprolol tartrate 25 MG tablet Commonly known as: LOPRESSOR Take 1 tablet (25 mg total) by mouth 2 (two) times daily.   multivitamin capsule Take 1 capsule by mouth daily.   pantoprazole 40 MG tablet Commonly known as:  PROTONIX Take 40 mg by mouth daily.   POTASSIMIN PO Take 500 mg by mouth daily. 250 per tab   tiZANidine 4 MG tablet Commonly known as: ZANAFLEX Take 4 mg by  mouth at bedtime as needed for muscle spasms.   Vitamin C 500 MG Caps Take 500 mg by mouth daily.   warfarin 5 MG tablet Commonly known as: COUMADIN Take 1 tablet (5 mg total) by mouth daily at 4 PM.               Durable Medical Equipment  (From admission, onward)           Start     Ordered   11/15/21 0900  For home use only DME 3 n 1  Once        11/15/21 0859   11/11/21 0743  For home use only DME Walker rolling  Once       Question Answer Comment  Walker: With [employer] Wheels   Patient needs a walker to treat with the following condition S/P AVR      11/11/21 0347            Follow-up Information     Lightfoot, Lucile Crater, MD Follow up.   Specialty: Cardiothoracic Surgery Contact information: [place] [address] [place], [place] Follow up.   Why: referral made for HHPT- you will have copay cost est $120 per visit (you may call Sharmon Revere if you have any questions- [phone]) Contact information: [place] [address] [phone]         [facility] Follow up.   Why: (Adapt)- rolling walker and 3n1 arranged- to be delivered to room prior to discharge Contact information: [place] [address] [phone]         [facility] Follow up on 11/17/2021.   Specialty: Cardiology Why: Appointment is at 2:30 for PT/INR check Contact information: [address], [address] [place]        Loel Dubonnet, NP Follow up on 11/29/2021.   Specialty: Cardiology Why: Appointment is at 10:05 Contact information: [address] [place] [address] [phone]                 Signed: Ellwood Handler, PA-C 11/16/2021, 8:52 AM

## 2021-11-09 NOTE — Discharge Summary (Incomplete Revision)
[address]       [place] [address]             [phone]    Physician Discharge Summary  Patient ID: Mary Chambers MRN: [id number] DOB/AGE: 52-Jun-1972 52 y.o.  Admit date: 11/07/2021 Discharge date: 11/15/2021  Admission Diagnoses:  Patient Active Problem List   Diagnosis Date Noted   Severe aortic regurgitation 07/27/2021   Bicuspid aortic valve 07/27/2021   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   Paraspinal abscess ([place]) 07/26/2021   Vertebral osteomyelitis ([place])    Septic arthritis ([place]) 06/23/2021   Transaminitis 06/23/2021   Abscess 06/23/2021   COVID-19 virus infection 06/23/2021   Gastroesophageal reflux disease 12/15/2020   Screening for colorectal cancer 12/15/2020   History of gastric ulcer 12/15/2020   Herniated lumbar disc without myelopathy 10/02/2019   Lumbar spinal stenosis 10/01/2019   Pain in left foot 03/29/2018   Arthritis 02/13/2018   Chronic back pain 02/13/2018   Migraines 02/13/2018   Morbidly obese ([place]) 02/13/2018   Synovial cyst of lumbar spine 10/15/2017   Osteoarthritis of knee 07/13/2017   Lumbar disc disease with radiculopathy 08/01/2016   Headache 05/04/2016   Numbness of foot 05/04/2016   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   Arthrodesis status 02/02/2016   Chronic pain syndrome 02/02/2016   Lumbar stenosis with neurogenic claudication 05/07/2015   Bilateral leg edema 04/21/2015   Carpal tunnel syndrome of right wrist 12/14/2014   Renal atrophy, left 01/23/2014   Achilles tendonitis 01/16/2014   Right lower quadrant pain 12/18/2013   Hypertension, benign 06/03/2010   Urinary incontinence 06/03/2010   Cervical disc disease 09/09/2009   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   Discharge Diagnoses:  Patient Active Problem List   Diagnosis Date Noted   S/P AVR (aortic valve replacement) 11/07/2021   Severe aortic regurgitation 07/27/2021   Bicuspid aortic valve 07/27/2021   Aortic valve endocarditis  07/26/2021   Paraspinal abscess ([place]) 07/26/2021   Vertebral osteomyelitis ([place])    Septic arthritis ([place]) 06/23/2021   Transaminitis 06/23/2021   Abscess 06/23/2021   COVID-19 virus infection 06/23/2021   Gastroesophageal reflux disease 12/15/2020   Screening for colorectal cancer 12/15/2020   History of gastric ulcer 12/15/2020   Herniated lumbar disc without myelopathy 10/02/2019   Lumbar spinal stenosis 10/01/2019   Pain in left foot 03/29/2018   Arthritis 02/13/2018   Chronic back pain 02/13/2018   Migraines 02/13/2018   Morbidly obese ([place]) 02/13/2018   Synovial cyst of lumbar spine 10/15/2017   Osteoarthritis of knee 07/13/2017   Lumbar disc disease with radiculopathy 08/01/2016   Headache 05/04/2016   Numbness of foot 05/04/2016   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   Arthrodesis status 02/02/2016   Chronic pain syndrome 02/02/2016   Lumbar stenosis with neurogenic claudication 05/07/2015   Bilateral leg edema 04/21/2015   Carpal tunnel syndrome of right wrist 12/14/2014   Renal atrophy, left 01/23/2014   Achilles tendonitis 01/16/2014   Right lower quadrant pain 12/18/2013   Hypertension, benign 06/03/2010   Urinary incontinence 06/03/2010   Cervical disc disease 09/09/2009   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 This was diagnosed in September of last year.  She was evaluated by Dr. Kipp Brood in January at which time she continued to have shortness of breath with minimal activity.  Repeat Echocardiogram was obtained and showed severe AR with a  bicuspid aortic valve.  It was felt she would require aortic valve replacement.  The risks and benefits of the procedure were explained to the patient and she was agreeable to proceed.  She underwent dental extractions prior to surgery.  Of note, she presented to the ED on 1/23 with complaints of right  sided lower back pain with radiation down her leg.  She was recently admitted in September with similar issues and was found to have a spinal abscess which had to be drained.  The patient is currently using a cane due to pain associated with mobility.  ED workup consisted of MRI which showed no evidence of infection, but there was a synovial cyst.  Neurosurgery was contacted and they recommended patient be discharged with a Medrol dose pack and follow up with them in a week.  Hospital Course:  Mary Chambers presented to [facility] on 11/07/2021.  She was taken to the operating room and underwent Aortic Valve Replacement with a 23 mm ON-X Mechanical Aortic Valve.  She tolerated the procedure without difficulty and was taken to the SICU in stable condition.  The patient was extubated without difficulty the evening of surgery.  She did not require inotropic support post operatively.  She was started on coumadin therapy for her mechanical AVR.  Her pacing wires, chest tubes, and arterial lines were removed without difficulty.   She was maintaining Sinus Tachycardia and her Lopressor dose was titrated accordingly.  She was volume overloaded and started on Lasix.  The patient was stable for transfer to the progressive care unit on 11/10/2021.  Her hemoglobin level has slowly trended down, likely related to post operative blood loss anemia.  She was started on iron therapy.  She remains on coumadin for her Mechanical AVR.  Her most recent INR 2.5 is and she will be discharged on 5 mg of coumadin.  She has been mildly hypertensive tachycardic and her Lopressor dose was increased.  Unfortunately she developed orthostasis and was unable to tolerate the increased dose.  She had diuresed well and will be placed on her home regimen of Lasix.  She is ambulating without difficulty.  Her surgical incisions are healing without evidence of infection.  She is medically stable for discharge home today.  Consults:  None  Significant Diagnostic Studies: cardiac graphics:    Echocardiogram:    Patient Name:   Mary Chambers Date of Exam: 11/04/2021  Medical Rec #:  [id number]                 Height:       67.0 in  Accession #:    [id number]                Weight:       254.0 lb  Date of Birth:  10/01/1971                 BSA:          2.238 m  Patient Age:    19 years                  BP:           122/78 mmHg  Patient Gender: F                         HR:           85 bpm.  Exam Location:  Outpatient   Procedure:  2D Echo, Color Doppler and Cardiac Doppler   Indications:    Q23.1 Bicuspid aortic valve; I35.2 Nonrheumatic aortic  (valve)                  stenosis with insufficiency     History:        Patient has prior history of Echocardiogram examinations,  most                  recent 08/31/2021. Risk Factors:Hypertension.     Sonographer:    Raquel Sarna Senior RDCS  Referring Phys: [id number] [place]    1. Left ventricular ejection fraction, by estimation, is 55 to 60%. The  left ventricle has normal function. The left ventricle has no regional  wall motion abnormalities. The left ventricular internal cavity size was  mildly dilated. There is mild  concentric left ventricular hypertrophy. Left ventricular diastolic  parameters were normal.   2. Right ventricular systolic function is normal. The right ventricular  size is normal.   3. Left atrial size was mild to moderately dilated.   4. The mitral valve is grossly normal. Trivial mitral valve  regurgitation. No evidence of mitral stenosis.   5. Aortic valve not well visualized on current images but known to be  bicuspid from prior study. Severe eccentric AR, see image 41, jet fills  LVOT. The aortic valve was not well visualized. There is moderate  calcification of the aortic valve. Aortic  valve regurgitation is severe.   6. The inferior vena cava is normal in size with <50% respiratory  variability,  suggesting right atrial pressure of 8 mmHg.   Treatments: surgery:   11/07/2021 Patient:  Mary Chambers Pre-Op Dx: Hx of aortic valve endocarditis Severe aortic insufficiency Biscuspid aortic valve   Post-op Dx:  same Procedure: Aortic valve replacement with a 44mm OnX valve       Surgeon and Role:      * Lightfoot, Lucile Crater, MD - Primary    * E Leaner Morici, PA-C -  An experienced assistant was required given the complexity of this surgery and the standard of surgical care. The assistant was needed for exposure, dissection, suctioning, retraction of delicate tissues and sutures, instrument exchange and for overall help during this procedure.    Discharge Exam: Blood pressure 95/68, pulse (!) 103, temperature 98.5 F (36.9 C), temperature source Oral, resp. rate 18, height 5\' 7"  (1.702 m), weight 133.3 kg, SpO2 92 %.  General appearance: alert, cooperative, and no distress Heart: regular rate and rhythm Lungs: clear to auscultation bilaterally Abdomen: soft, non-tender; bowel sounds normal; no masses,  no organomegaly Extremities: edema trace Wound: clean and dry   Discharge Medications:  The patient has been discharged on:   1.Beta Blocker:  Yes [ X  ]                              No   [   ]                              If No, reason:  2.Ace Inhibitor/ARB: Yes [   ]                                     No  [ x   ]  If No, reason: titration of BB  3.Statin:   Yes [ X  ]                  No  [   ]                  If No, reason:  4.Ecasa:  Yes  [ X  ]                  No   [   ]                  If No, reason:  Patient had ACS upon admission:  Plavix/P2Y12 inhibitor: Yes [   ]                                      No  [ X  ]  Discharge Instructions     Amb Referral to Cardiac Rehabilitation   Complete by: As directed    Diagnosis: Valve Replacement   Valve: Aortic   After initial evaluation and assessments completed:  Virtual Based Care may be provided alone or in conjunction with Phase 2 Cardiac Rehab based on patient barriers.: Yes      Allergies as of 11/15/2021       Reactions   Baclofen Hives   Dilaudid [hydromorphone] Itching   Doxycycline Itching   Percocet [oxycodone-acetaminophen] Hives   Septra [sulfamethoxazole-trimethoprim] Hives   Gabapentin Rash        Medication List     STOP taking these medications    enalapril 10 MG tablet Commonly known as: VASOTEC   methylPREDNISolone 4 MG Tbpk tablet Commonly known as: MEDROL DOSEPAK   naproxen 500 MG tablet Commonly known as: Naprosyn   traMADol 50 MG tablet Commonly known as: ULTRAM       TAKE these medications    aspirin 81 MG EC tablet Take 1 tablet (81 mg total) by mouth daily. Swallow whole.   atorvastatin 10 MG tablet Commonly known as: LIPITOR Take 10 mg by mouth daily.   cholecalciferol 25 MCG (1000 UNIT) tablet Commonly known as: VITAMIN D3 Take 1,000 Units by mouth daily.   fluticasone 50 MCG/ACT nasal spray Commonly known as: FLONASE Place 2 sprays into both nostrils daily. What changed:  when to take this reasons to take this   furosemide 20 MG tablet Commonly known as: LASIX Take 1 tablet (20 mg total) by mouth daily as needed. For weight gain of 3 lbs in 1 day or 5 lbs in 2 What changed:  when to take this reasons to take this additional instructions   HYDROcodone-acetaminophen 5-325 MG tablet Commonly known as: NORCO/VICODIN Take 1 tablet by mouth every 4 (four) hours as needed for severe pain. What changed: when to take this   loperamide 2 MG capsule Commonly known as: IMODIUM Take 1 capsule (2 mg total) by mouth as needed for diarrhea or loose stools.   metoprolol tartrate 25 MG tablet Commonly known as: LOPRESSOR Take 1 tablet (25 mg total) by mouth 2 (two) times daily.   multivitamin capsule Take 1 capsule by mouth daily.   pantoprazole 40 MG tablet Commonly known as:  PROTONIX Take 40 mg by mouth daily.   POTASSIMIN PO Take 500 mg by mouth daily. 250 per tab   tiZANidine 4 MG tablet Commonly known as: ZANAFLEX Take 4 mg by  mouth at bedtime as needed for muscle spasms.   Vitamin C 500 MG Caps Take 500 mg by mouth daily.   warfarin 5 MG tablet Commonly known as: COUMADIN Take 1 tablet (5 mg total) by mouth daily at 4 PM. Start taking on: November 16, 2021               Durable Medical Equipment  (From admission, onward)           Start     Ordered   11/15/21 0900  For home use only DME 3 n 1  Once        11/15/21 0859   11/11/21 0743  For home use only DME Walker rolling  Once       Question Answer Comment  Walker: With 5 Inch Wheels   Patient needs a walker to treat with the following condition S/P AVR      11/11/21 7517            Follow-up Information     Lightfoot, Lucile Crater, MD Follow up.   Specialty: Cardiothoracic Surgery Contact information: [place] [address] [place], [place] Follow up.   Why: referral made for HHPT- you will have copay cost est $120 per visit (you may call Sharmon Revere if you have any questions- [phone]) Contact information: [place] [address] [phone]         [facility] Follow up.   Why: (Adapt)- rolling walker and 3n1 arranged- to be delivered to room prior to discharge Contact information: [place] [address] [phone]         [facility] Follow up on 11/17/2021.   Specialty: Cardiology Why: Appointment is at 2:30 for PT/INR check Contact information: [address], [address] [place]        Loel Dubonnet, NP Follow up on 11/29/2021.   Specialty: Cardiology Why: Appointment is at 10:05 Contact information: [place] [address] [phone]                  Signed: Ellwood Handler, PA-C 11/15/2021, 12:43 PM

## 2021-11-09 NOTE — Progress Notes (Signed)
Epicardial pacing wires pulled per MD order at 1300. Wires intact upon removal. There was no bleeding at site and patient had no complications. Patient will remain on bed rest for 1 hour.  Patient off bedrest at 1400. Vital signs are stable.

## 2021-11-09 NOTE — Progress Notes (Addendum)
° °   °  [address]       [place] [address]             [phone]      2 Days Post-Op Procedure(s) (LRB): AORTIC VALVE REPLACEMENT USING A 23MM On-X AORTIC VALVE. (N/A) TRANSESOPHAGEAL ECHOCARDIOGRAM (TEE) (N/A)  Subjective:  Patient sitting up in chair.  Complains of a headache along the back of her head, otherwise her pain is being well managed.  She ambulated the unit yesterday.  + BM  Objective: Vital signs in last 24 hours: Temp:  [98.1 F (36.7 C)-98.7 F (37.1 C)] 98.1 F (36.7 C) (02/08 0400) Pulse Rate:  [76-130] 96 (02/08 0600) Cardiac Rhythm: Normal sinus rhythm (02/08 0400) Resp:  [14-19] 14 (02/08 0600) BP: (94-130)/(59-78) 122/67 (02/08 0600) SpO2:  [87 %-99 %] 97 % (02/08 0600) Arterial Line BP: (117-135)/(57-61) 135/61 (02/07 0930) Weight:  [120.9 kg] 120.9 kg (02/08 0600)  Hemodynamic parameters for last 24 hours: CVP:  [13 mmHg-14 mmHg] 13 mmHg  Intake/Output from previous day: 02/07 0701 - 02/08 0700 In: 1200.7 [P.O.:840; I.V.:60.7; IV Piggyback:300] Out: 1700 [Urine:1370; Stool:200; Chest Tube:130]  General appearance: alert, cooperative, and no distress Heart: regular rate and rhythm Lungs: clear to auscultation bilaterally Abdomen: soft, non-tender; bowel sounds normal; no masses,  no organomegaly Extremities: edema trace Wound: aquacel in place on sternum  Lab Results: Recent Labs    11/08/21 1723 11/09/21 0325  WBC 13.6* 12.0*  HGB 8.5* 7.8*  HCT 27.9* 25.3*  PLT 120* 101*   BMET:  Recent Labs    11/08/21 1723 11/09/21 0325  NA 137 138  K 4.1 4.2  CL 105 103  CO2 24 27  GLUCOSE 146* 104*  BUN 19 15  CREATININE 1.14* 0.86  CALCIUM 8.1* 8.1*    PT/INR:  Recent Labs    11/09/21 0325  LABPROT 21.3*  INR 1.9*   ABG    Component Value Date/Time   PHART 7.295 (L) 11/07/2021 1810   HCO3 22.6 11/07/2021 1810   TCO2 24 11/07/2021 1810   ACIDBASEDEF 4.0 (H) 11/07/2021 1810   O2SAT 99.0 11/07/2021 1810    CBG (last 3)  Recent Labs    11/08/21 2007 11/08/21 2312 11/09/21 0325  GLUCAP 106* 110* 105*    Assessment/Plan: S/P Procedure(s) (LRB): AORTIC VALVE REPLACEMENT USING A 23MM On-X AORTIC VALVE. (N/A) TRANSESOPHAGEAL ECHOCARDIOGRAM (TEE) (N/A)  CV- Sinus Tachycardia, BP is running 90-120s- will titrate Lopressor as BP allows.. d/c EPW today INR 1.9, only received 1 dose of 5 mg last night, will decrease to 2.5 mg daily Pulm- wean oxygen as tolerated, CXR w/o effusion/atelectasis.. CT output 130 cc... will leave in place for now, could possibly remove later today after EPW removed Renal- creatinine WNL, weight is elevated, would benefit from lasix, but will hold off today for titration of BB Expected post operative blood loss anemia, Hgb at 7.8 monitor CBGs controlled, patient is not a diabetic will change SSIP to TID/HS Dispo- patient doing well, looks good.... increase Lopressor as BP allows, d/c EPW today, coumadin for mechanical AVR, possibly d/c chest tubes later today, transfer to 4E   LOS: 2 days   Ellwood Handler, PA-C 11/09/2021  No events Will remove epicardial wires, and chest tubes Will decrease coumadin Transfer to floor  [place]

## 2021-11-09 NOTE — Discharge Instructions (Addendum)
Discharge Instructions:  1. You may shower, please wash incisions daily with soap and water and keep dry.  If you wish to cover wounds with dressing you may do so but please keep clean and change daily.  No tub baths or swimming until incisions have completely healed.  If your incisions become red or develop any drainage please call our office at [phone]  2. No Driving until cleared by Dr. Abran Duke office and you are no longer using narcotic pain medications  3. Monitor your weight daily.. Please use the same scale and weigh at same time... If you gain 5-10 lbs in 48 hours with associated lower extremity swelling, please contact our office at [phone]  4. Fever of 101.5 for at least 24 hours with no source, please contact our office at [phone]  5. Activity- up as tolerated, please walk at least 3 times per day.  Avoid strenuous activity, no lifting, pushing, or pulling with your arms over 8-10 lbs for a minimum of 6 weeks  6. If any questions or concerns arise, please do not hesitate to contact our office at [phone]    _________________________________________ Information on my medicine - Coumadin   (Warfarin)  This medication education was reviewed with me or my healthcare representative as part of my discharge preparation.   Why was Coumadin prescribed for you? Coumadin was prescribed for you because you have a blood clot or a medical condition that can cause an increased risk of forming blood clots. Blood clots can cause serious health problems by blocking the flow of blood to the heart, lung, or brain. Coumadin can prevent harmful blood clots from forming. As a reminder your indication for Coumadin is:  Blood Clot Prevention after Heart Valve Surgery  What test will check on my response to Coumadin? While on Coumadin (warfarin) you will need to have an INR test regularly to ensure that your dose is keeping you in the desired range. The INR (international  normalized ratio) number is calculated from the result of the laboratory test called prothrombin time (PT).  If an INR APPOINTMENT HAS NOT ALREADY BEEN MADE FOR YOU please schedule an appointment to have this lab work done by your health care provider within 7 days. Your INR goal is usually a number between:  2 to 3 or your provider may give you a more narrow range like 2-2.5.  Ask your health care provider during an office visit what your goal INR is.  What  do you need to  know  About  COUMADIN? Take Coumadin (warfarin) exactly as prescribed by your healthcare provider about the same time each day.  DO NOT stop taking without talking to the doctor who prescribed the medication.  Stopping without other blood clot prevention medication to take the place of Coumadin may increase your risk of developing a new clot or stroke.  Get refills before you run out.  What do you do if you miss a dose? If you miss a dose, take it as soon as you remember on the same day then continue your regularly scheduled regimen the next day.  Do not take two doses of Coumadin at the same time.  Important Safety Information A possible side effect of Coumadin (Warfarin) is an increased risk of bleeding. You should call your healthcare provider right away if you experience any of the following: Bleeding from an injury or your nose that does not stop. Unusual colored urine (red or dark brown) or unusual colored stools (red or  black). Unusual bruising for unknown reasons. A serious fall or if you hit your head (even if there is no bleeding).  Some foods or medicines interact with Coumadin (warfarin) and might alter your response to warfarin. To help avoid this: Eat a balanced diet, maintaining a consistent amount of Vitamin K. Notify your provider about major diet changes you plan to make. Avoid alcohol or limit your intake to 1 drink for women and 2 drinks for men per day. (1 drink is 5 oz. wine, 12 oz. beer, or 1.5 oz.  liquor.)  Make sure that ANY health care provider who prescribes medication for you knows that you are taking Coumadin (warfarin).  Also make sure the healthcare provider who is monitoring your Coumadin knows when you have started a new medication including herbals and non-prescription products.  Coumadin (Warfarin)  Major Drug Interactions  Increased Warfarin Effect Decreased Warfarin Effect  Alcohol (large quantities) Antibiotics (esp. Septra/Bactrim, Flagyl, Cipro) Amiodarone (Cordarone) Aspirin (ASA) Cimetidine (Tagamet) Megestrol (Megace) NSAIDs (ibuprofen, naproxen, etc.) Piroxicam (Feldene) Propafenone (Rythmol SR) Propranolol (Inderal) Isoniazid (INH) Posaconazole (Noxafil) Barbiturates (Phenobarbital) Carbamazepine (Tegretol) Chlordiazepoxide (Librium) Cholestyramine (Questran) Griseofulvin Oral Contraceptives Rifampin Sucralfate (Carafate) Vitamin K   Coumadin (Warfarin) Major Herbal Interactions  Increased Warfarin Effect Decreased Warfarin Effect  Garlic Ginseng Ginkgo biloba Coenzyme Q10 Green tea St. Johns wort    Coumadin (Warfarin) FOOD Interactions  Eat a consistent number of servings per week of foods HIGH in Vitamin K (1 serving =  cup)  Collards (cooked, or boiled & drained) Kale (cooked, or boiled & drained) Mustard greens (cooked, or boiled & drained) Parsley *serving size only =  cup Spinach (cooked, or boiled & drained) Swiss chard (cooked, or boiled & drained) Turnip greens (cooked, or boiled & drained)  Eat a consistent number of servings per week of foods MEDIUM-HIGH in Vitamin K (1 serving = 1 cup)  Asparagus (cooked, or boiled & drained) Broccoli (cooked, boiled & drained, or raw & chopped) Brussel sprouts (cooked, or boiled & drained) *serving size only =  cup Lettuce, raw (green leaf, endive, romaine) Spinach, raw Turnip greens, raw & chopped   These websites have more information on Coumadin (warfarin):   FailFactory.se; VeganReport.com.au;

## 2021-11-09 NOTE — Progress Notes (Signed)
Pt arrived to 4E from [place]. Tele applied and CCMD called. VSS. Pt A&Ox4. Oriented to room and call light in reach.  Raelyn Number, RN

## 2021-11-10 ENCOUNTER — Other Ambulatory Visit: Payer: Self-pay | Admitting: Cardiology

## 2021-11-10 DIAGNOSIS — I350 Nonrheumatic aortic (valve) stenosis: Secondary | ICD-10-CM

## 2021-11-10 LAB — BASIC METABOLIC PANEL
Anion gap: 6 (ref 5–15)
BUN: 18 mg/dL (ref 6–20)
CO2: 27 mmol/L (ref 22–32)
Calcium: 8.2 mg/dL — ABNORMAL LOW (ref 8.9–10.3)
Chloride: 106 mmol/L (ref 98–111)
Creatinine, Ser: 0.9 mg/dL (ref 0.44–1.00)
GFR, Estimated: >60 mL/min (ref >60)
Glucose, Bld: 102 mg/dL — ABNORMAL HIGH (ref 70–99)
Potassium: 4.7 mmol/L (ref 3.5–5.1)
Sodium: 139 mmol/L (ref 135–145)

## 2021-11-10 LAB — GLUCOSE, CAPILLARY
Glucose-Capillary: 94 mg/dL (ref 70–99)
Glucose-Capillary: 126 mg/dL — ABNORMAL HIGH (ref 70–99)
Glucose-Capillary: 98 mg/dL (ref 70–99)
Glucose-Capillary: 116 mg/dL — ABNORMAL HIGH (ref 70–99)

## 2021-11-10 LAB — LIPID PANEL
Cholesterol: 91 mg/dL (ref 0–200)
HDL: 28 mg/dL — ABNORMAL LOW (ref >40)
LDL Cholesterol: 49 mg/dL (ref 0–99)
Total CHOL/HDL Ratio: 3.3 RATIO
Triglycerides: 69 mg/dL (ref <150)
VLDL: 14 mg/dL (ref 0–40)

## 2021-11-10 LAB — CBC
HCT: 23 % — ABNORMAL LOW (ref 36.0–46.0)
Hemoglobin: 7 g/dL — ABNORMAL LOW (ref 12.0–15.0)
MCH: 26.2 pg (ref 26.0–34.0)
MCHC: 30.4 g/dL (ref 30.0–36.0)
MCV: 86.1 fL (ref 80.0–100.0)
Platelets: 121 10*3/uL — ABNORMAL LOW (ref 150–400)
RBC: 2.67 MIL/uL — ABNORMAL LOW (ref 3.87–5.11)
RDW: 15.4 % (ref 11.5–15.5)
WBC: 10.4 10*3/uL (ref 4.0–10.5)
nRBC: 0 % (ref 0.0–0.2)

## 2021-11-10 LAB — PROTIME-INR
INR: 1.8 — ABNORMAL HIGH (ref 0.8–1.2)
Prothrombin Time: 20.6 seconds — ABNORMAL HIGH (ref 11.4–15.2)

## 2021-11-10 MED ORDER — FERROUS SULFATE 325 (65 FE) MG PO TABS
325.0000 mg | ORAL_TABLET | Freq: Two times a day (BID) | ORAL | Status: DC
  Administered 2021-11-10 – 2021-11-16 (×13): 325 mg via ORAL
  Filled 2021-11-10 (×13): qty 1

## 2021-11-10 MED ORDER — WARFARIN SODIUM 2.5 MG PO TABS
2.5000 mg | ORAL_TABLET | Freq: Every day | ORAL | Status: DC
  Administered 2021-11-10: 2.5 mg via ORAL
  Filled 2021-11-10: qty 1

## 2021-11-10 MED FILL — Sodium Bicarbonate IV Soln 8.4%: INTRAVENOUS | Qty: 50 | Status: AC

## 2021-11-10 MED FILL — Sodium Chloride IV Soln 0.9%: INTRAVENOUS | Qty: 3000 | Status: AC

## 2021-11-10 MED FILL — Albumin, Human Inj 5%: INTRAVENOUS | Qty: 250 | Status: AC

## 2021-11-10 MED FILL — Calcium Chloride Inj 10%: INTRAVENOUS | Qty: 10 | Status: AC

## 2021-11-10 MED FILL — Heparin Sodium (Porcine) Inj 1000 Unit/ML: INTRAMUSCULAR | Qty: 10 | Status: AC

## 2021-11-10 MED FILL — Electrolyte-R (PH 7.4) Solution: INTRAVENOUS | Qty: 4000 | Status: AC

## 2021-11-10 MED FILL — Mannitol IV Soln 20%: INTRAVENOUS | Qty: 500 | Status: AC

## 2021-11-10 NOTE — Progress Notes (Signed)
Mobility Specialist Progress Note   11/10/21 1455  Oxygen Therapy  SpO2 91 %  O2 Device Nasal Cannula  O2 Flow Rate (L/min) 1 L/min  Mobility  Activity Ambulated with assistance in hallway  Level of Assistance Moderate assist, patient does 50-74%  Assistive Device Front wheel walker  Distance Ambulated (ft) 428 ft  Activity Response Tolerated well  $Mobility charge 1 Mobility   Received pt in bed having no complaints and agreeable. No assist needed to EOB but ModA for STS. Contact G throughout ambulation for pt safety and acclaimed "drop foot". X3 standing rest breaks d/t fatigue but minimal drop in O2, ranged from 89-91% SpO2. Left back in bed w/ call bell in reach.   Holland Falling Mobility Specialist Phone Number [phone]

## 2021-11-10 NOTE — Evaluation (Signed)
Physical Therapy Evaluation Patient Details Name: Mary Chambers MRN: [id number] DOB: 1971-06-29 Today's Date: 11/10/2021  History of Present Illness  The pt is a 51 yo female presenting 2/6 for aortic valve replacement. 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   Clinical Impression  Pt in bed upon arrival of PT, agreeable to evaluation at this time. Prior to admission the pt was mobilizing short distances with use of a quad cane, but reports limited in endurance by SOB with exertion. The pt was living at home with her adult sons and is hopeful to return to work when able. The pt now presents with limitations in functional mobility, strength, LE power, endurance, and dynamic stability due to above dx, and will continue to benefit from skilled PT to address these deficits. She is most limited in LE power and strength to perform sit-stand transfer with sternal precautions, needing modA and increased time to power up to standing. She was able to maintain SpO2 >90% on RA with hallway ambulation, but reports her gait feels slower than normal despite noticing some initial improvements in SOB with exertion. Will continue to benefit from skilled PT acutely and following d/c home to improve LE strength and endurance prior to start of OP cardiac rehab.   Gait Speed: 0.16 m/s using RW and with RA. (Gait speed <0.44m/s indicates increased risk of falls and dependence in ADLs)    Recommendations for follow up therapy are one component of a multi-disciplinary discharge planning process, led by the attending physician.  Recommendations may be updated based on patient status, additional functional criteria and insurance authorization.  Follow Up Recommendations Home health PT    Assistance Recommended at Discharge Intermittent Supervision/Assistance  Patient can return home with the following  A little help with walking and/or  transfers;Assistance with cooking/housework;Help with stairs or ramp for entrance;Assist for transportation    Equipment Recommendations Rolling walker (2 wheels)  Recommendations for Other Services       Functional Status Assessment Patient has had a recent decline in their functional status and demonstrates the ability to make significant improvements in function in a reasonable and predictable amount of time.     Precautions / Restrictions Precautions Precautions: Fall;Sternal Precaution Booklet Issued: Yes (comment) Restrictions Weight Bearing Restrictions: No      Mobility  Bed Mobility Overal bed mobility: Needs Assistance Bed Mobility: Rolling, Supine to Sit, Sit to Supine Rolling: Supervision   Supine to sit: Supervision Sit to supine: Supervision   General bed mobility comments: no assist given, pt using heart pillow    Transfers Overall transfer level: Needs assistance Equipment used: None Transfers: Sit to/from Stand Sit to Stand: Mod assist           General transfer comment: modA to power up on intial attempt, minA to lower, modA again on repeated attempts. pt with good adherence toprecautions    Ambulation/Gait Ambulation/Gait assistance: Min guard Gait Distance (Feet): 200 Feet Assistive device: Rolling walker (2 wheels) Gait Pattern/deviations: Step-to pattern, Decreased dorsiflexion - left, Shuffle Gait velocity: 0.16 m/s Gait velocity interpretation: <1.31 ft/sec, indicative of household ambulator   General Gait Details: pt with small strides and minimal clearance. compensatory hip flexion for LLE drop foot    Balance Overall balance assessment: Mild deficits observed, not formally tested  Pertinent Vitals/Pain Pain Assessment Pain Assessment: No/denies pain    Home Living Family/patient expects to be discharged to:: Private residence Living Arrangements: Children (adult  children) Available Help at Discharge: Family;Available PRN/intermittently Type of Home: House Home Access: Level entry       Home Layout: Multi-level;Able to live on main level with bedroom/bathroom Home Equipment: Cane - quad;Toilet riser;Shower seat      Prior Function Prior Level of Function : Independent/Modified Independent;Working/employed;Driving             Mobility Comments: pt uses quad cane, reports limited to about 200 ft before needing to stop and rest ADLs Comments: no assist, 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     Hand Dominance   Dominant Hand: Right    Extremity/Trunk Assessment   Upper Extremity Assessment Upper Extremity Assessment: Generalized weakness    Lower Extremity Assessment Lower Extremity Assessment: Generalized weakness (poor functional power, L drop foot at baseline)    Cervical / Trunk Assessment Cervical / Trunk Assessment: Back Surgery (x6 in past (x2 cervical, x1 thoracic, x3 lumbar))  Communication   Communication: No difficulties  Cognition Arousal/Alertness: Awake/alert Behavior During Therapy: WFL for tasks assessed/performed Overall Cognitive Status: Within Functional Limits for tasks assessed                                          General Comments General comments (skin integrity, edema, etc.): VSS on RA        Assessment/Plan    PT Assessment Patient needs continued PT services  PT Problem List Decreased strength;Decreased activity tolerance;Decreased balance;Decreased mobility;Obesity       PT Treatment Interventions DME instruction;Gait training;Stair training;Functional mobility training;Therapeutic activities;Therapeutic exercise;Balance training;Patient/family education    PT Goals (Current goals can be found in the Care Plan section)  Acute Rehab PT Goals Patient Stated Goal: to return to work PT Goal Formulation: With patient Time For Goal Achievement:  11/24/21 Potential to Achieve Goals: Good    Frequency Min 3X/week        AM-PAC PT "6 Clicks" Mobility  Outcome Measure Help needed turning from your back to your side while in a flat bed without using bedrails?: A Little Help needed moving from lying on your back to sitting on the side of a flat bed without using bedrails?: A Little Help needed moving to and from a bed to a chair (including a wheelchair)?: A Little Help needed standing up from a chair using your arms (e.g., wheelchair or bedside chair)?: A Little Help needed to walk in hospital room?: A Little Help needed climbing 3-5 steps with a railing? : A Little 6 Click Score: 18    End of Session Equipment Utilized During Treatment: Gait belt Activity Tolerance: Patient tolerated treatment well Patient left: in bed;with call bell/phone within reach Nurse Communication: Mobility status PT Visit Diagnosis: Other abnormalities of gait and mobility (R26.89);Muscle weakness (generalized) (M62.81)    Time: 1629-1700 PT Time Calculation (min) (ACUTE ONLY): 31 min   Charges:   PT Evaluation $PT Eval Low Complexity: 1 Low PT Treatments $Therapeutic Exercise: 8-22 mins        West Carbo, PT, DPT   Acute Rehabilitation Department Pager #: [phone]  Sandra Cockayne 11/10/2021, 6:04 PM

## 2021-11-10 NOTE — Progress Notes (Signed)
CARDIAC REHAB PHASE I   PRE:  Rate/Rhythm: 73 SR    BP: sitting 118/61    SaO2: 96-98 3L, 86-90 RA at rest  MODE:  Ambulation: 120 ft   POST:  Rate/Rhythm: 95 SR    BP: sitting 126/78     SaO2: 95 2 L  Pt moved to EOB following sternal precautions from raised HOB. Moved hips forward with verbal cues. Raised bed at pts request. Pt failed to stand on first attempt with gait belt and rocking. Second attempt max assist with gait belt, slowly stood. Pt with significant diaphoresis with activity. Very slow pace/steps with RW. Fatigue with distance.  To recliner, VSS. Still requiring O2 but practicing IS, 1100 ml. Pt would benefit from PT inpatient and HHPT for strengthening standing following sternal precautions. She will need RW for home. Her sons will be with her at d/c. [place], ACSM 11/10/2021 10:50 AM

## 2021-11-10 NOTE — Progress Notes (Addendum)
° °   °  [address]       [place] [address]             [phone]      3 Days Post-Op Procedure(s) (LRB): AORTIC VALVE REPLACEMENT USING A 23MM On-X AORTIC VALVE. (N/A) TRANSESOPHAGEAL ECHOCARDIOGRAM (TEE) (N/A)  Subjective:  Patient doing well.  No Specific complaints.  + ambulation  Objective: Vital signs in last 24 hours: Temp:  [97.8 F (36.6 C)-98.8 F (37.1 C)] 98.5 F (36.9 C) (02/09 0610) Pulse Rate:  [76-97] 78 (02/09 0610) Cardiac Rhythm: Sinus tachycardia (02/08 1900) Resp:  [11-19] 17 (02/09 0610) BP: (99-123)/(56-69) 115/66 (02/09 0610) SpO2:  [94 %-99 %] 98 % (02/09 0610) FiO2 (%):  [28 %] 28 % (02/08 0752)   Intake/Output from previous day: 02/08 0701 - 02/09 0700 In: 1798.9 [P.O.:720; I.V.:1078.9] Out: 850 [Urine:850]  General appearance: alert, cooperative, and no distress Heart: regular rate and rhythm Lungs: clear to auscultation bilaterally Abdomen: soft, non-tender; bowel sounds normal; no masses,  no organomegaly Extremities: edema trace Wound: aquacel in place  Lab Results: Recent Labs    11/09/21 0325 11/10/21 0216  WBC 12.0* 10.4  HGB 7.8* 7.0*  HCT 25.3* 23.0*  PLT 101* 121*   BMET:  Recent Labs    11/09/21 0325 11/10/21 0216  NA 138 139  K 4.2 4.7  CL 103 106  CO2 27 27  GLUCOSE 104* 102*  BUN 15 18  CREATININE 0.86 0.90  CALCIUM 8.1* 8.2*    PT/INR:  Recent Labs    11/10/21 0216  LABPROT 20.6*  INR 1.8*   ABG    Component Value Date/Time   PHART 7.295 (L) 11/07/2021 1810   HCO3 22.6 11/07/2021 1810   TCO2 24 11/07/2021 1810   ACIDBASEDEF 4.0 (H) 11/07/2021 1810   O2SAT 99.0 11/07/2021 1810   CBG (last 3)  Recent Labs    11/09/21 1611 11/09/21 2211 11/10/21 0708  GLUCAP 96 113* 94    Assessment/Plan: S/P Procedure(s) (LRB): AORTIC VALVE REPLACEMENT USING A 23MM On-X AORTIC VALVE. (N/A) TRANSESOPHAGEAL ECHOCARDIOGRAM (TEE) (N/A)  CV-NSR, BP stable- continue Lopressor INR 1.8,  will increase coumadin to 2.5 mg daily Pulm- CTs out yesterday, will get 2V CXR in AM Renal- creatinine stable, weight is trending down,  continue Lasix, potassium Expected post operative blood loss anemia, Hgb slowly trending down, currently at 7.0.Marland Kitchen patient is asymptomatic, will start iron therapy CBGs- controlled, patient is not a diabetic will d/c SSIP Dispo- patient stable,continue coumadin for mechanical AVR, will get CXR in AM.. patient doing well, continue current care   LOS: 3 days    Ellwood Handler, PA-C 11/10/2021  Agree with above Awaiting therapeutic INR Dispo planning  Arben Packman O Charina Fons

## 2021-11-11 ENCOUNTER — Other Ambulatory Visit: Payer: Self-pay

## 2021-11-11 ENCOUNTER — Inpatient Hospital Stay (HOSPITAL_COMMUNITY): Payer: 59

## 2021-11-11 LAB — GLUCOSE, CAPILLARY
Glucose-Capillary: 90 mg/dL (ref 70–99)
Glucose-Capillary: 118 mg/dL — ABNORMAL HIGH (ref 70–99)
Glucose-Capillary: 109 mg/dL — ABNORMAL HIGH (ref 70–99)
Glucose-Capillary: 128 mg/dL — ABNORMAL HIGH (ref 70–99)

## 2021-11-11 LAB — PROTIME-INR
INR: 1.6 — ABNORMAL HIGH (ref 0.8–1.2)
Prothrombin Time: 18.8 seconds — ABNORMAL HIGH (ref 11.4–15.2)

## 2021-11-11 IMAGING — DX DG CHEST 2V
2 series · 2 of 2 positions shown · non-contrast
Comparison: [DATE]

CLINICAL DATA: Status post aortic valve replacement.

EXAM:
CHEST - 2 VIEW

[chest lat]
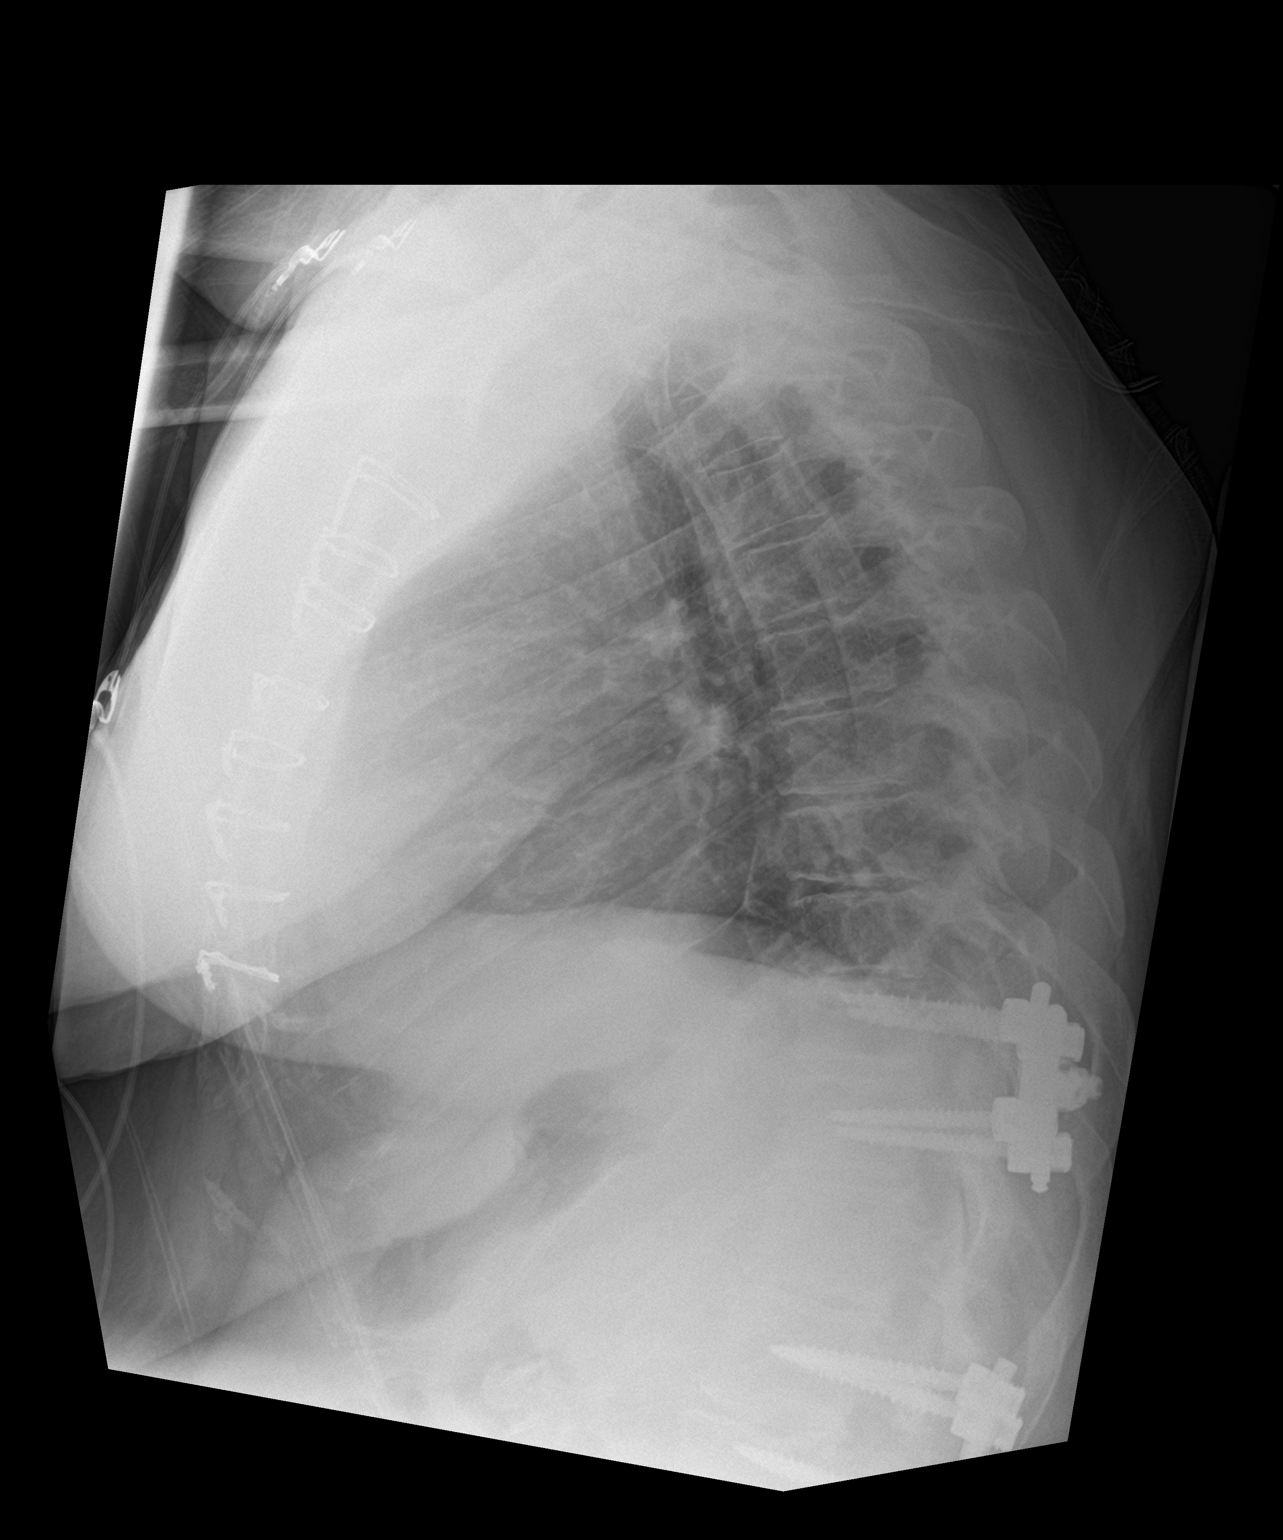

[chest ap]
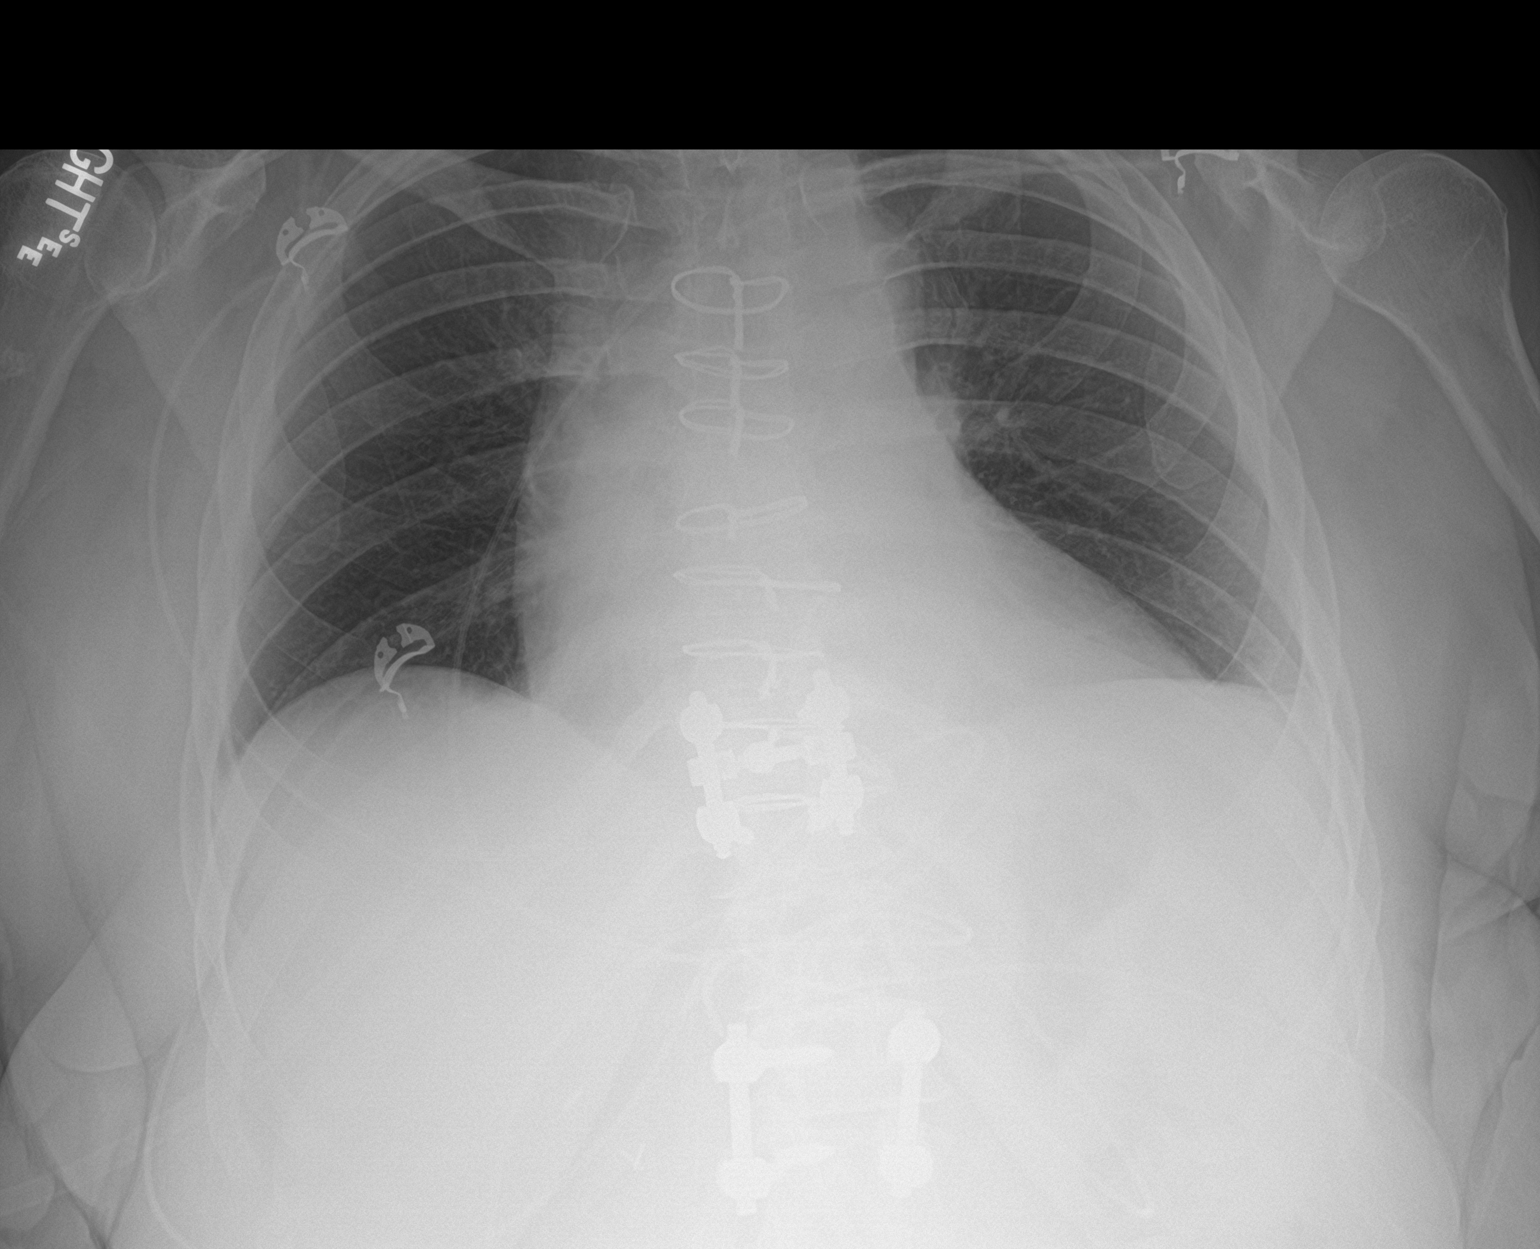

[2 of 2 positions shown; findings below may reference images not displayed]

FINDINGS: Interval removal of the right IJ CVC. Prior median sternotomy and
aortic valve replacement. The heart size and mediastinal contours
are unchanged. No focal consolidation. No significant pleural
effusion. Trace bilateral pleural effusions. Posterior spinal fusion
hardware.
IMPRESSION: No focal airspace consolidation. Trace bilateral pleural effusions.

## 2021-11-11 MED ORDER — WARFARIN SODIUM 5 MG PO TABS
5.0000 mg | ORAL_TABLET | Freq: Every day | ORAL | Status: DC
  Administered 2021-11-11 – 2021-11-12 (×2): 5 mg via ORAL
  Filled 2021-11-11 (×2): qty 1

## 2021-11-11 MED ORDER — FUROSEMIDE 10 MG/ML IJ SOLN
40.0000 mg | Freq: Once | INTRAMUSCULAR | Status: AC
  Administered 2021-11-11: 40 mg via INTRAVENOUS
  Filled 2021-11-11: qty 4

## 2021-11-11 NOTE — TOC Initial Note (Signed)
Transition of Care (TOC) - Initial/Assessment Note  [name] RN, BSN Transitions of Care Unit 4E- RN Case Manager See Treatment Team for direct phone #    Patient Details  Name: Mary Chambers MRN: [id number] Date of Birth: Jun 28, 1971  Transition of [facility]) CM/SW Contact:    Dawayne Patricia, RN Phone Number: 11/11/2021, 3:58 PM  Clinical Narrative:                 Orders placed for [facility] and DME needs. Pt from home s/p AVR.  CM in to speak with pt for transition needs. Per pt she will have transportation home, address, phone # and PCP all confirmed in epic.  Discussed with pt HH and DME needs. Pt confirmed she needs RW for home, agreeable to using in house provider- [facility] for DME List provided for [facility] choice Per CMS guidelines from medicare.gov website with star ratings (copy placed in shadow chart) - pt has selected [facility] as her first choice, [facility] as second- if neither of those can accept then pt states she does not have a preference and will defer to this writer to secure agency on her behalf.   Call made to [facility] for DME need- RW to be delivered to room prior to discharge.   Call made to [facility] with [facility] for [place] referral- referral accepted pending insurance verification- per Malachy Mood pt will most likely have 20% copay per visit. Cheryl to f/u with patient.  Expected Discharge Plan: [place] Barriers to Discharge: Continued Medical Work up   Patient Goals and CMS Choice Patient states their goals for this hospitalization and ongoing recovery are:: return home CMS Medicare.gov Compare Post Acute Care list provided to:: Patient Choice offered to / list presented to : Patient  Expected Discharge Plan and Services Expected Discharge Plan: [place]   Discharge Planning Services: CM Consult Post Acute Care Choice: Home Health, Durable Medical Equipment Living arrangements for the past 2 months: Single Family Home                  DME Arranged: Walker rolling DME Agency: [facility] Date DME Agency Contacted: 11/11/21 Time DME Agency Contacted: [id number] Representative spoke with at DME Agency: [place]: PT [place]: [place] Date [place]: 11/11/21 Time [place]: 1445 Representative spoke with at [place]: Malachy Mood  Prior Living Arrangements/Services Living arrangements for the past 2 months: [place] with:: Self, Adult Children Patient language and need for interpreter reviewed:: Yes Do you feel safe going back to the place where you live?: Yes      Need for Family Participation in Patient Care: Yes (Comment) Care giver support system in place?: Yes (comment)   Criminal Activity/Legal Involvement Pertinent to Current Situation/Hospitalization: No - Comment as needed  Activities of Daily Living Home Assistive Devices/Equipment: Scales, Eyeglasses, Dentures (specify type), Cane (specify quad or straight), Grab bars in shower ADL Screening (condition at time of admission) Patient's cognitive ability adequate to safely complete daily activities?: Yes Is the patient deaf or have difficulty hearing?: No Does the patient have difficulty seeing, even when wearing glasses/contacts?: No Does the patient have difficulty concentrating, remembering, or making decisions?: No Patient able to express need for assistance with ADLs?: Yes Does the patient have difficulty dressing or bathing?: No Independently performs ADLs?: Yes (appropriate for developmental age) Does the patient have difficulty walking or climbing stairs?: Yes Weakness of Legs:  None Weakness of Arms/Hands: Both  Permission Sought/Granted Permission sought to share information with : Facility Art therapist granted to share information with : Yes, Verbal Permission Granted     Permission granted to share info w AGENCY: HH/DME        Emotional  Assessment Appearance:: Appears stated age Attitude/Demeanor/Rapport: Engaged Affect (typically observed): Accepting, Appropriate, Pleasant Orientation: : Oriented to Self, Oriented to Place, Oriented to  Time, Oriented to Situation Alcohol / Substance Use: Not Applicable Psych Involvement: No (comment)  Admission diagnosis:  S/P AVR (aortic valve replacement) [Z95.2] Patient Active Problem List   Diagnosis Date Noted   S/P AVR (aortic valve replacement) 11/07/2021   Severe aortic regurgitation 07/27/2021   Bicuspid aortic valve 07/27/2021   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   Paraspinal abscess ([place]) 07/26/2021   Vertebral osteomyelitis ([place])    Septic arthritis ([place]) 06/23/2021   Transaminitis 06/23/2021   Abscess 06/23/2021   COVID-19 virus infection 06/23/2021   Gastroesophageal reflux disease 12/15/2020   Screening for colorectal cancer 12/15/2020   History of gastric ulcer 12/15/2020   Herniated lumbar disc without myelopathy 10/02/2019   Lumbar spinal stenosis 10/01/2019   Pain in left foot 03/29/2018   Arthritis 02/13/2018   Chronic back pain 02/13/2018   Migraines 02/13/2018   Morbidly obese ([place]) 02/13/2018   Synovial cyst of lumbar spine 10/15/2017   Osteoarthritis of knee 07/13/2017   Lumbar disc disease with radiculopathy 08/01/2016   Headache 05/04/2016   Numbness of foot 05/04/2016   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   Arthrodesis status 02/02/2016   Chronic pain syndrome 02/02/2016   Lumbar stenosis with neurogenic claudication 05/07/2015   Bilateral leg edema 04/21/2015   Carpal tunnel syndrome of right wrist 12/14/2014   Renal atrophy, left 01/23/2014   Achilles tendonitis 01/16/2014   Right lower quadrant pain 12/18/2013   Hypertension, benign 06/03/2010   Urinary incontinence 06/03/2010   Cervical disc disease 09/09/2009   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   PCP:  [name], NP Pharmacy:   [id number] - [place], [place] [address] [place] [address] Phone: [phone] Fax: [phone]     Social Determinants of Health (SDOH) Interventions    Readmission Risk Interventions Readmission Risk Prevention Plan 11/11/2021 10/03/2019  Transportation Screening Complete Complete  PCP or Specialist Appt within 5-7 Days Complete -  PCP or Specialist Appt within 3-5 Days - Not Complete  Not Complete comments - pt to arrange due to holiday  Home Care Screening Complete -  Medication Review (RN CM) Complete -  HRI or [place] - Complete  Social Work Consult for [place] Planning/Counseling - Complete  Palliative Care Screening - Not Applicable  Medication Review Press photographer) - Referral to Pharmacy  Some recent data might be hidden

## 2021-11-11 NOTE — Progress Notes (Signed)
Physical Therapy Treatment Patient Details Name: Mary Chambers MRN: [id number] DOB: November 08, 1970 Today's Date: 11/11/2021   History of Present Illness The pt is a 50 yo female presenting 2/6 for aortic valve replacement. 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    PT Comments    Patient progressing well towards PT goals. Session focused on functional transfers/transitions especially sit to stand from different surface heights. Requires Mod A progressing to Min guard assist with cues for anterior weight shift and use of momentum. Reviewed "move in the tube." Discussed ways to increase HOB at home using wedge for ease of bed mobility. Will continue to work on transfer training and bed mobility as well as LE strengthening to prepare pt for d/c home. Will follow.   Recommendations for follow up therapy are one component of a multi-disciplinary discharge planning process, led by the attending physician.  Recommendations may be updated based on patient status, additional functional criteria and insurance authorization.  Follow Up Recommendations  Home health PT     Assistance Recommended at Discharge Intermittent Supervision/Assistance  Patient can return home with the following A little help with walking and/or transfers;Assistance with cooking/housework;Help with stairs or ramp for entrance;Assist for transportation   Equipment Recommendations  Rolling walker (2 wheels)    Recommendations for Other Services       Precautions / Restrictions Precautions Precautions: Fall;Sternal Precaution Booklet Issued: Yes (comment) Precaution Comments: Reviewed "move in the tube" and precautions Restrictions Weight Bearing Restrictions: No     Mobility  Bed Mobility Overal bed mobility: Needs Assistance Bed Mobility: Rolling, Supine to Sit Rolling: Supervision   Supine to sit: Supervision, HOB elevated      General bed mobility comments: HOB elevated, use of pillow, no assist.    Transfers Overall transfer level: Needs assistance Equipment used: Rolling walker (2 wheels) Transfers: Sit to/from Stand Sit to Stand: Mod assist, Min assist, Min guard           General transfer comment: Mod A to power from toilet with use of momentum and anterior weight shift, Min A to stand from EOB simulating bed height at home slightly elevated progressing to Min guard with cues for forward momentum/weight shift. Stood from EOB x5 with emphasis on slow descent.    Ambulation/Gait Ambulation/Gait assistance: Min guard Gait Distance (Feet): 200 Feet Assistive device: Rolling walker (2 wheels) Gait Pattern/deviations: Step-to pattern, Decreased dorsiflexion - left, Shuffle, Step-through pattern Gait velocity: .47 ft.sec Gait velocity interpretation: <1.8 ft/sec, indicate of risk for recurrent falls   General Gait Details: pt with small strides and minimal clearance. compensatory hip flexion for LLE drop foot, 2 standing rest breaks.   Stairs             Wheelchair Mobility    Modified Rankin (Stroke Patients Only)       Balance Overall balance assessment: Needs assistance Sitting-balance support: Feet supported, No upper extremity supported Sitting balance-Leahy Scale: Fair     Standing balance support: During functional activity Standing balance-Leahy Scale: Fair Standing balance comment: Able to wash hands at sink without difficulty.                            Cognition Arousal/Alertness: Awake/alert Behavior During Therapy: WFL for tasks assessed/performed Overall Cognitive Status: Within Functional Limits for tasks assessed  Exercises      General Comments General comments (skin integrity, edema, etc.): HR up to 124 bpm with activity, Sp02 >90% on RA.      Pertinent Vitals/Pain Pain Assessment Pain  Assessment: No/denies pain    Home Living                          Prior Function            PT Goals (current goals can now be found in the care plan section) Progress towards PT goals: Progressing toward goals    Frequency    Min 3X/week      PT Plan Current plan remains appropriate    Co-evaluation              AM-PAC PT "6 Clicks" Mobility   Outcome Measure  Help needed turning from your back to your side while in a flat bed without using bedrails?: A Little Help needed moving from lying on your back to sitting on the side of a flat bed without using bedrails?: A Little Help needed moving to and from a bed to a chair (including a wheelchair)?: A Little Help needed standing up from a chair using your arms (e.g., wheelchair or bedside chair)?: A Lot Help needed to walk in hospital room?: A Little Help needed climbing 3-5 steps with a railing? : A Little 6 Click Score: 17    End of Session   Activity Tolerance: Patient tolerated treatment well Patient left: in chair;with call bell/phone within reach Nurse Communication: Mobility status PT Visit Diagnosis: Other abnormalities of gait and mobility (R26.89);Muscle weakness (generalized) (M62.81)     Time: 7681-1572 PT Time Calculation (min) (ACUTE ONLY): 28 min  Charges:  $Gait Training: 8-22 mins $Therapeutic Activity: 8-22 mins                     Marisa Severin, PT, DPT Acute Rehabilitation Services Pager [phone] Office [place] 11/11/2021, 9:55 AM

## 2021-11-11 NOTE — Progress Notes (Signed)
°   °  [address]       [place] [address]             [phone]       No events Ambulated 4X yesterday  Vitals:   11/10/21 2311 11/11/21 0402  BP: 116/66 120/67  Pulse: 89 85  Resp: 20 18  Temp: 98.1 F (36.7 C) 98 F (36.7 C)  SpO2: 97% 98%   Alert NAD Sinus EWOB ND  POD 4 s/p mAVR INR downtrending, increased coumadin to 5 Gave additional dose of lasix.  Weight up [place]

## 2021-11-11 NOTE — Progress Notes (Signed)
Mobility Specialist Progress Note:   11/11/21 1500  Mobility  Activity Ambulated with assistance in hallway  Level of Assistance Contact guard assist, steadying assist  Assistive Device Front wheel walker  Distance Ambulated (ft) 450 ft  Activity Response Tolerated well  $Mobility charge 1 Mobility   Session performed on RA. Pt agreed to hallway ambulation, requiring min guard throughout session. Pt "seeing stars" toward end of session, SpO2 of 90% once reliable pleth, suspect desat. Pt recovered with a standing rest break, left in chair at sink for NT to bathe.   Nelta Numbers Mobility Specialist  Phone: [phone]

## 2021-11-11 NOTE — Plan of Care (Signed)
?  Problem: Clinical Measurements: ?Goal: Will remain free from infection ?Outcome: Progressing ?  ?Problem: Activity: ?Goal: Risk for activity intolerance will decrease ?Outcome: Progressing ?  ?Problem: Elimination: ?Goal: Will not experience complications related to bowel motility ?Outcome: Progressing ?  ?Problem: Pain Managment: ?Goal: General experience of comfort will improve ?Outcome: Progressing ?  ?

## 2021-11-11 NOTE — Progress Notes (Signed)
Pt declined ambulating right now. Discussed IS, sternal precautions, exercise, diet, and CRPII. Pt receptive. Will refer to [place]. Encouraged walking x3 a day. [place], ACSM 2:54 PM 11/11/2021

## 2021-11-12 LAB — CBC
HCT: 24.3 % — ABNORMAL LOW (ref 36.0–46.0)
Hemoglobin: 7.7 g/dL — ABNORMAL LOW (ref 12.0–15.0)
MCH: 27 pg (ref 26.0–34.0)
MCHC: 31.7 g/dL (ref 30.0–36.0)
MCV: 85.3 fL (ref 80.0–100.0)
Platelets: 183 10*3/uL (ref 150–400)
RBC: 2.85 MIL/uL — ABNORMAL LOW (ref 3.87–5.11)
RDW: 15 % (ref 11.5–15.5)
WBC: 6.8 10*3/uL (ref 4.0–10.5)
nRBC: 0 % (ref 0.0–0.2)

## 2021-11-12 LAB — GLUCOSE, CAPILLARY
Glucose-Capillary: 89 mg/dL (ref 70–99)
Glucose-Capillary: 112 mg/dL — ABNORMAL HIGH (ref 70–99)
Glucose-Capillary: 108 mg/dL — ABNORMAL HIGH (ref 70–99)
Glucose-Capillary: 129 mg/dL — ABNORMAL HIGH (ref 70–99)

## 2021-11-12 LAB — BASIC METABOLIC PANEL
Anion gap: 7 (ref 5–15)
BUN: 18 mg/dL (ref 6–20)
CO2: 29 mmol/L (ref 22–32)
Calcium: 8.6 mg/dL — ABNORMAL LOW (ref 8.9–10.3)
Chloride: 104 mmol/L (ref 98–111)
Creatinine, Ser: 0.98 mg/dL (ref 0.44–1.00)
GFR, Estimated: >60 mL/min (ref >60)
Glucose, Bld: 99 mg/dL (ref 70–99)
Potassium: 4.6 mmol/L (ref 3.5–5.1)
Sodium: 140 mmol/L (ref 135–145)

## 2021-11-12 LAB — PROTIME-INR
INR: 1.5 — ABNORMAL HIGH (ref 0.8–1.2)
Prothrombin Time: 17.6 seconds — ABNORMAL HIGH (ref 11.4–15.2)

## 2021-11-12 MED ORDER — POTASSIUM CHLORIDE CRYS ER 20 MEQ PO TBCR
30.0000 meq | EXTENDED_RELEASE_TABLET | Freq: Two times a day (BID) | ORAL | Status: DC
  Administered 2021-11-12 – 2021-11-16 (×8): 30 meq via ORAL
  Filled 2021-11-12 (×8): qty 1

## 2021-11-12 MED ORDER — FUROSEMIDE 40 MG PO TABS
40.0000 mg | ORAL_TABLET | Freq: Two times a day (BID) | ORAL | Status: DC
  Administered 2021-11-12 – 2021-11-14 (×4): 40 mg via ORAL
  Filled 2021-11-12 (×4): qty 1

## 2021-11-12 NOTE — Progress Notes (Signed)
CARDIAC REHAB PHASE I   PRE:  Rate/Rhythm: 88 SR  BP:  Supine:   Sitting: 147/78  Standing:    SaO2: 95% RA  MODE:  Ambulation: 220 ft   POST:  Rate/Rhythm: 106 ST  BP:  Supine: 131/78 Sitting:   Standing:    SaO2: 97% RA  1250-1330 Two person assist to stand with gait belt. Assist x1 with rolling walker and gait belt for ambulation. Tolerated ambulation fair, slow steady gait, multiple standing rest breaks taken. Patient c/o tiredness, SOB, and incisional pain, which she rated a 6/10 on the pain scale. Assisted to bed after walk. Nurse Tech inserted new Purewick, told her about incisional pain, tiredness, and SOB after walk. Call bell within reach.  Sol Passer, MS, ACSM CEP

## 2021-11-12 NOTE — Progress Notes (Addendum)
° °   °  [address]       [place] [address]             [phone]      5 Days Post-Op Procedure(s) (LRB): AORTIC VALVE REPLACEMENT USING A 23MM On-X AORTIC VALVE. (N/A) TRANSESOPHAGEAL ECHOCARDIOGRAM (TEE) (N/A) Subjective:  Feels like she is progressing. Walked in the hall this morning.  Remains on RA with adequate sats.   Objective: Vital signs in last 24 hours: Temp:  [97.6 F (36.4 C)-98.5 F (36.9 C)] 98.5 F (36.9 C) (02/11 1100) Pulse Rate:  [76-101] 85 (02/11 1100) Cardiac Rhythm: Normal sinus rhythm;Sinus tachycardia (02/11 0830) Resp:  [15-20] 19 (02/11 1100) BP: (118-140)/(63-78) 140/78 (02/11 1100) SpO2:  [93 %-100 %] 95 % (02/11 1100) Weight:  [126.2 kg] 126.2 kg (02/11 0445)  Hemodynamic parameters for last 24 hours:    Intake/Output from previous day: 02/10 0701 - 02/11 0700 In: 360 [P.O.:360] Out: 3250 [Urine:3250] Intake/Output this shift: Total I/O In: 354 [P.O.:354] Out: -   General appearance: alert, cooperative, and no distress Neurologic: intact Heart: stable SR Lungs: breath sounds clear, sat 96% on RA Abdomen: soft, NT. Extremities: mild LE edema Wound: the sternotomy incision is healing well. Pacer wires are out.   Lab Results: Recent Labs    11/10/21 0216 11/12/21 0805  WBC 10.4 6.8  HGB 7.0* 7.7*  HCT 23.0* 24.3*  PLT 121* 183   BMET:  Recent Labs    11/10/21 0216 11/12/21 0805  NA 139 140  K 4.7 4.6  CL 106 104  CO2 27 29  GLUCOSE 102* 99  BUN 18 18  CREATININE 0.90 0.98  CALCIUM 8.2* 8.6*    PT/INR:  Recent Labs    11/12/21 0202  LABPROT 17.6*  INR 1.5*   ABG    Component Value Date/Time   PHART 7.295 (L) 11/07/2021 1810   HCO3 22.6 11/07/2021 1810   TCO2 24 11/07/2021 1810   ACIDBASEDEF 4.0 (H) 11/07/2021 1810   O2SAT 99.0 11/07/2021 1810   CBG (last 3)  Recent Labs    11/11/21 2056 11/12/21 0554 11/12/21 1152  GLUCAP 128* 89 112*    Assessment/Plan: S/P Procedure(s)  (LRB): AORTIC VALVE REPLACEMENT USING A 23MM On-X AORTIC VALVE. (N/A) TRANSESOPHAGEAL ECHOCARDIOGRAM (TEE) (N/A)  -POD5 AVR with an OnX mechanical valve for severe AI, H/O bicuspid aortic valve with endocarditis. VS and cardiac rhythm are stable. INR 1.5 after Coumadin 2.5mg  x 3 and 5mg  x 1. Will increase to 6mg  this PM.   -Expected acute blood loss anemia- Hct is 7.7 and trending up. She is tolerating this OK. She is on Fe++ supplement.  Monitor.  -Volume excess- Wt is several kg positive compared to pre-op Wt but she was not weighed for the past few days. Good response to Lasix yesterday. Repeat BID dosing today.   -Anxiety / Chronic pain syndrome- Lyrica, Robaxin, and Zanaflex have been resumed.   -Disposition- anticipate discharge to home in 1-2 days. Home health PT and rolling walker arranged.    LOS: 5 days    Antony Odea, [place] [phone] 11/12/2021  Agree with above.

## 2021-11-13 LAB — BASIC METABOLIC PANEL
Anion gap: 9 (ref 5–15)
BUN: 19 mg/dL (ref 6–20)
CO2: 30 mmol/L (ref 22–32)
Calcium: 8.6 mg/dL — ABNORMAL LOW (ref 8.9–10.3)
Chloride: 103 mmol/L (ref 98–111)
Creatinine, Ser: 0.99 mg/dL (ref 0.44–1.00)
GFR, Estimated: >60 mL/min (ref >60)
Glucose, Bld: 143 mg/dL — ABNORMAL HIGH (ref 70–99)
Potassium: 4.5 mmol/L (ref 3.5–5.1)
Sodium: 142 mmol/L (ref 135–145)

## 2021-11-13 LAB — CBC
HCT: 26.1 % — ABNORMAL LOW (ref 36.0–46.0)
Hemoglobin: 8.4 g/dL — ABNORMAL LOW (ref 12.0–15.0)
MCH: 27.2 pg (ref 26.0–34.0)
MCHC: 32.2 g/dL (ref 30.0–36.0)
MCV: 84.5 fL (ref 80.0–100.0)
Platelets: 223 10*3/uL (ref 150–400)
RBC: 3.09 MIL/uL — ABNORMAL LOW (ref 3.87–5.11)
RDW: 15.2 % (ref 11.5–15.5)
WBC: 8.5 10*3/uL (ref 4.0–10.5)
nRBC: 0 % (ref 0.0–0.2)

## 2021-11-13 LAB — GLUCOSE, CAPILLARY
Glucose-Capillary: 87 mg/dL (ref 70–99)
Glucose-Capillary: 158 mg/dL — ABNORMAL HIGH (ref 70–99)

## 2021-11-13 LAB — PROTIME-INR
INR: 1.7 — ABNORMAL HIGH (ref 0.8–1.2)
Prothrombin Time: 20.3 seconds — ABNORMAL HIGH (ref 11.4–15.2)

## 2021-11-13 MED ORDER — WARFARIN SODIUM 7.5 MG PO TABS
7.5000 mg | ORAL_TABLET | Freq: Every day | ORAL | Status: DC
  Administered 2021-11-13 – 2021-11-14 (×2): 7.5 mg via ORAL
  Filled 2021-11-13 (×2): qty 1

## 2021-11-13 NOTE — Progress Notes (Signed)
Mobility Specialist Progress Note:   11/13/21 1200  Mobility  Activity Ambulated with assistance in hallway;Ambulated with assistance to bathroom  Level of Assistance Moderate assist, patient does 50-74%  Assistive Device Front wheel walker  Distance Ambulated (ft) 50 ft  Activity Response Tolerated poorly  $Mobility charge 1 Mobility   Pt eager for OOB mobility. Pt to BR at beginning of session to void, then hallway ambulation. Pt became lightheaded/shaky in hallway, HR up to 140bpm, desat to 86% on RA. Recovered with seated rest break, pt back in bed with all needs met. RN aware.   Nelta Numbers Mobility Specialist  Phone: [phone]

## 2021-11-13 NOTE — Progress Notes (Addendum)
° °   °  [address]       [place], [address]             [phone]      6 Days Post-Op Procedure(s) (LRB): AORTIC VALVE REPLACEMENT USING A 23MM On-X AORTIC VALVE. (N/A) TRANSESOPHAGEAL ECHOCARDIOGRAM (TEE) (N/A) Subjective:  Said she felt anxious and noticed HR 140 when she got out of bed earlier.  Otherwise feels OK. Pain controlled.  Remains on RA with adequate sats.   Objective: Vital signs in last 24 hours: Temp:  [98 F (36.7 C)-98.4 F (36.9 C)] 98 F (36.7 C) (02/12 0822) Pulse Rate:  [82-104] 104 (02/12 0955) Cardiac Rhythm: Normal sinus rhythm;Sinus tachycardia (02/12 0800) Resp:  [15-20] 20 (02/12 0955) BP: (125-146)/(61-76) 146/76 (02/12 0822) SpO2:  [91 %-98 %] 94 % (02/12 0955) Weight:  [120.2 kg] 120.2 kg (02/12 0418)  Hemodynamic parameters for last 24 hours:    Intake/Output from previous day: 02/11 0701 - 02/12 0700 In: 622 [P.O.:622] Out: 950 [Urine:950] Intake/Output this shift: Total I/O In: -  Out: 650 [Urine:650]  General appearance: alert, cooperative, and no distress Neurologic: intact Heart: stable SR Lungs: breath sounds clear, sat 96% on RA Abdomen: soft, NT. Extremities: mild LE edema Wound: the sternotomy incision is healing well.   Lab Results: Recent Labs    11/12/21 0805 11/13/21 0113  WBC 6.8 8.5  HGB 7.7* 8.4*  HCT 24.3* 26.1*  PLT 183 223    BMET:  Recent Labs    11/12/21 0805 11/13/21 0113  NA 140 142  K 4.6 4.5  CL 104 103  CO2 29 30  GLUCOSE 99 143*  BUN 18 19  CREATININE 0.98 0.99  CALCIUM 8.6* 8.6*     PT/INR:  Recent Labs    11/13/21 0113  LABPROT 20.3*  INR 1.7*    ABG    Component Value Date/Time   PHART 7.295 (L) 11/07/2021 1810   HCO3 22.6 11/07/2021 1810   TCO2 24 11/07/2021 1810   ACIDBASEDEF 4.0 (H) 11/07/2021 1810   O2SAT 99.0 11/07/2021 1810   CBG (last 3)  Recent Labs    11/12/21 1642 11/12/21 2152 11/13/21 0631  GLUCAP 108* 129* 87      Assessment/Plan: S/P Procedure(s) (LRB): AORTIC VALVE REPLACEMENT USING A 23MM On-X AORTIC VALVE. (N/A) TRANSESOPHAGEAL ECHOCARDIOGRAM (TEE) (N/A)  -POD6 AVR with an OnX mechanical valve for severe AI, H/O bicuspid aortic valve with endocarditis. VS and cardiac rhythm are stable. INR 1.7 after Coumadin 2.5mg  x 3 and 5mg  x 2. Will increase to 7.5mg  this PM.   -Expected acute blood loss anemia- Hct is 8.4 and trending up. She is tolerating this OK. She is on Fe++ supplement.  Monitor.  -Volume excess- Wt is not accurate.  Recorded wt in 6kg less than yesterday with I&O only 371ml negative. Continuing PO Lasix BID.   -Anxiety / Chronic pain syndrome- Lyrica, Robaxin, and Zanaflex have been resumed.   -Disposition- anticipate discharge to home in 1-2 days when INR at least 2.0. Home health PT and rolling walker arranged.    LOS: 6 days    Malon Kindle [phone] 11/13/2021   Chart reviewed, patient examined, agree with above. Ambulating well.  INR 1.7 and Coumadin increased to 7.5 mg today.

## 2021-11-14 LAB — PROTIME-INR
INR: 1.8 — ABNORMAL HIGH (ref 0.8–1.2)
Prothrombin Time: 21.2 seconds — ABNORMAL HIGH (ref 11.4–15.2)

## 2021-11-14 LAB — GLUCOSE, CAPILLARY: Glucose-Capillary: 148 mg/dL — ABNORMAL HIGH (ref 70–99)

## 2021-11-14 MED ORDER — ENALAPRIL MALEATE 5 MG PO TABS
10.0000 mg | ORAL_TABLET | Freq: Every day | ORAL | Status: DC
  Administered 2021-11-14: 10 mg via ORAL
  Filled 2021-11-14 (×2): qty 2

## 2021-11-14 MED ORDER — FUROSEMIDE 20 MG PO TABS
20.0000 mg | ORAL_TABLET | Freq: Every day | ORAL | Status: DC
  Administered 2021-11-15: 20 mg via ORAL
  Filled 2021-11-14: qty 1

## 2021-11-14 NOTE — Progress Notes (Signed)
Pt in bed, sts she didn't sleep well last night. Asks to walk later. Also requests a 3n1 for home. Notified CM. [id number] [place], ACSM 11:14 AM 11/14/2021

## 2021-11-14 NOTE — Progress Notes (Signed)
Physical Therapy Treatment Patient Details Name: Mary Chambers MRN: [id number] DOB: [birth year] Today's Date: 11/14/2021   History of Present Illness The pt is a 51 yo female presenting 2/6 for aortic valve replacement. 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    PT Comments    Patient not progressing with mobility this session. Requires Mod A to stand from all surfaces with cues for technique. Ambulation distance limited due to multiple episodes of dizziness/nausea/"feeling weak" and seeing stars impacting mobility. Pt not able to walk more than 15' at a time today due to above. Sp02 >88% on RA and HR up to 120 bpm, BP 120s/90s. RN present and aware. This is new as of today per pt report. Will continue to follow and progress as tolerated.   Recommendations for follow up therapy are one component of a multi-disciplinary discharge planning process, led by the attending physician.  Recommendations may be updated based on patient status, additional functional criteria and insurance authorization.  Follow Up Recommendations  Home health PT     Assistance Recommended at Discharge Intermittent Supervision/Assistance  Patient can return home with the following A little help with walking and/or transfers;Assistance with cooking/housework;Help with stairs or ramp for entrance;Assist for transportation   Equipment Recommendations  BSC/3in1    Recommendations for Other Services       Precautions / Restrictions Precautions Precautions: Fall;Sternal Precaution Booklet Issued: Yes (comment) Precaution Comments: Reviewed "move in the tube" and precautions Restrictions Weight Bearing Restrictions: Yes Other Position/Activity Restrictions: sternal     Mobility  Bed Mobility Overal bed mobility: Needs Assistance Bed Mobility: Rolling, Sidelying to Sit Rolling: Supervision Sidelying to sit: Supervision, HOB  elevated       General bed mobility comments: HOB very elevated, exited on left side of bed to simulate home; adhering to precautions. No dizziness.    Transfers Overall transfer level: Needs assistance Equipment used: Rolling walker (2 wheels) Transfers: Sit to/from Stand Sit to Stand: Mod assist, +2 physical assistance           General transfer comment: unable to stand without assist despite cues/momentum, Mod A to power to standing from elevated bed height x1, 2 assist needed from low chair x1, from toilet x2. Pt began feeling weak/dizzy after taking some steps resulting in needing to sit quickly during all standing/walking attempts. BP and 02 stable.    Ambulation/Gait Ambulation/Gait assistance: Min guard Gait Distance (Feet): 5 Feet (+ 12' + 16') Assistive device: Rolling walker (2 wheels) Gait Pattern/deviations: Decreased dorsiflexion - left, Shuffle, Step-through pattern   Gait velocity interpretation: <1.31 ft/sec, indicative of household ambulator   General Gait Details: Slow, mostly steady gait with foot drop on left, limited distance due to feeling weak/dizzy/seeing stars, incontinent of urine. VSS.   Stairs             Wheelchair Mobility    Modified Rankin (Stroke Patients Only)       Balance Overall balance assessment: Needs assistance Sitting-balance support: Feet supported, No upper extremity supported Sitting balance-Leahy Scale: Fair Sitting balance - Comments: Assist needed to donn pull ups in sitting   Standing balance support: During functional activity   Standing balance comment: Able to take hands off RW to donn pull ups for short period with Min guard assist for support  Cognition Arousal/Alertness: Awake/alert Behavior During Therapy: WFL for tasks assessed/performed Overall Cognitive Status: Within Functional Limits for tasks assessed                                           Exercises      General Comments General comments (skin integrity, edema, etc.): HR up to 120 bpm with activity, Sp02 >88% on RA. BP stable 117-120s/90s.      Pertinent Vitals/Pain Pain Assessment Pain Assessment: No/denies pain    Home Living                          Prior Function            PT Goals (current goals can now be found in the care plan section) Progress towards PT goals: Not progressing toward goals - comment (due to episodes of dizziness/weakness/nausea)    Frequency    Min 3X/week      PT Plan Current plan remains appropriate    Co-evaluation              AM-PAC PT "6 Clicks" Mobility   Outcome Measure  Help needed turning from your back to your side while in a flat bed without using bedrails?: A Little Help needed moving from lying on your back to sitting on the side of a flat bed without using bedrails?: A Little Help needed moving to and from a bed to a chair (including a wheelchair)?: A Lot Help needed standing up from a chair using your arms (e.g., wheelchair or bedside chair)?: A Lot Help needed to walk in hospital room?: A Little Help needed climbing 3-5 steps with a railing? : A Little 6 Click Score: 16    End of Session Equipment Utilized During Treatment: Gait belt Activity Tolerance: Treatment limited secondary to medical complications (Comment) (dizziness/feeling weak/seeing stars) Patient left: in bed;with call bell/phone within reach;with family/visitor present Nurse Communication: Mobility status;Other (comment) (RN aware of episodes of feeling dizzy/seeing stars/weakness) PT Visit Diagnosis: Other abnormalities of gait and mobility (R26.89);Muscle weakness (generalized) (M62.81)     Time: 1497-0263 PT Time Calculation (min) (ACUTE ONLY): 33 min  Charges:  $Therapeutic Activity: 23-37 mins                     Marisa Severin, PT, DPT Acute Rehabilitation Services Pager [phone] Office  [phone]      Marguarite Arbour A Sabra Heck 11/14/2021, 3:28 PM

## 2021-11-14 NOTE — Progress Notes (Addendum)
° °   °  [address]       [place] [address]             [phone]      7 Days Post-Op Procedure(s) (LRB): AORTIC VALVE REPLACEMENT USING A 23MM On-X AORTIC VALVE. (N/A) TRANSESOPHAGEAL ECHOCARDIOGRAM (TEE) (N/A)  Subjective:  Patient doing well.  Has no new complaints.  Ambulation improving.  Objective: Vital signs in last 24 hours: Temp:  [98 F (36.7 C)-98.9 F (37.2 C)] 98.3 F (36.8 C) (02/13 0316) Pulse Rate:  [94-113] 96 (02/13 0316) Cardiac Rhythm: Sinus tachycardia (02/12 2043) Resp:  [15-22] 20 (02/13 0316) BP: (118-146)/(66-80) 129/75 (02/13 0316) SpO2:  [93 %-97 %] 95 % (02/13 0316) Weight:  [113.9 kg] 113.9 kg (02/13 0600)  Intake/Output from previous day: 02/12 0701 - 02/13 0700 In: 720 [P.O.:720] Out: 3950 [Urine:3950]  General appearance: alert, cooperative, and no distress Heart: regular rate and rhythm Lungs: clear to auscultation bilaterally Abdomen: soft, non-tender; bowel sounds normal; no masses,  no organomegaly Extremities: edema trace Wound: clean and ry  Lab Results: Recent Labs    11/12/21 0805 11/13/21 0113  WBC 6.8 8.5  HGB 7.7* 8.4*  HCT 24.3* 26.1*  PLT 183 223   BMET:  Recent Labs    11/12/21 0805 11/13/21 0113  NA 140 142  K 4.6 4.5  CL 104 103  CO2 29 30  GLUCOSE 99 143*  BUN 18 19  CREATININE 0.98 0.99  CALCIUM 8.6* 8.6*    PT/INR:  Recent Labs    11/14/21 0207  LABPROT 21.2*  INR 1.8*   ABG    Component Value Date/Time   PHART 7.295 (L) 11/07/2021 1810   HCO3 22.6 11/07/2021 1810   TCO2 24 11/07/2021 1810   ACIDBASEDEF 4.0 (H) 11/07/2021 1810   O2SAT 99.0 11/07/2021 1810   CBG (last 3)  Recent Labs    11/13/21 0631 11/13/21 2111 11/14/21 0609  GLUCAP 87 158* 148*    Assessment/Plan: S/P Procedure(s) (LRB): AORTIC VALVE REPLACEMENT USING A 23MM On-X AORTIC VALVE. (N/A) TRANSESOPHAGEAL ECHOCARDIOGRAM (TEE) (N/A)  CV-maintaining NSR-on Lopressor 25 mg BID INR 1.8, on  coumadin at 7.5 mg daily Pulm- off oxygen, no acute issues, continue IS Renal- creatinine remains in normal limits at 0.99, weight is below baseline, continue Lasix, will reduce to home regimen at discharge Post operative blood loss anemia, mild Hgb up to 8.4 on iron Deconditioning- H/H has been arranged Dispo- patient stable, maintaining NSR, INR trending upward currently at 1.8, continue coumadin at 7.5 mg daily, for d/c home today vs. Tomorrow, will discuss with Dr. Kipp Brood   LOS: 7 days    Ellwood Handler, PA-C 11/14/2021  Agree with above Awaiting therapeutic INR  [place]

## 2021-11-15 LAB — PROTIME-INR
INR: 2.5 — ABNORMAL HIGH (ref 0.8–1.2)
Prothrombin Time: 27 seconds — ABNORMAL HIGH (ref 11.4–15.2)

## 2021-11-15 MED ORDER — FUROSEMIDE 20 MG PO TABS: 20.0000 mg | ORAL_TABLET | Freq: Every day | ORAL | 0 refills | Status: DC | PRN

## 2021-11-15 MED ORDER — METOPROLOL TARTRATE 50 MG PO TABS
50.0000 mg | ORAL_TABLET | Freq: Two times a day (BID) | ORAL | Status: DC
  Administered 2021-11-15: 50 mg via ORAL
  Filled 2021-11-15: qty 1

## 2021-11-15 MED ORDER — METOPROLOL TARTRATE 25 MG PO TABS: 25.0000 mg | ORAL_TABLET | Freq: Two times a day (BID) | ORAL | 3 refills | Status: DC

## 2021-11-15 MED ORDER — WARFARIN SODIUM 5 MG PO TABS: 5.0000 mg | ORAL_TABLET | Freq: Every day | ORAL | 3 refills | Status: DC

## 2021-11-15 MED ORDER — WARFARIN SODIUM 5 MG PO TABS: 5.0000 mg | ORAL_TABLET | Freq: Every day | ORAL | Status: DC

## 2021-11-15 MED ORDER — METOPROLOL TARTRATE 25 MG/10 ML ORAL SUSPENSION: 50.0000 mg | Freq: Two times a day (BID) | ORAL | Status: DC

## 2021-11-15 MED ORDER — METOPROLOL TARTRATE 25 MG PO TABS
25.0000 mg | ORAL_TABLET | Freq: Two times a day (BID) | ORAL | Status: DC
  Administered 2021-11-15: 25 mg via ORAL
  Filled 2021-11-15: qty 1

## 2021-11-15 MED ORDER — ASPIRIN 81 MG PO TBEC: 81.0000 mg | DELAYED_RELEASE_TABLET | Freq: Every day | ORAL | 11 refills | Status: DC

## 2021-11-15 MED ORDER — METOPROLOL TARTRATE 25 MG/10 ML ORAL SUSPENSION: 25.0000 mg | Freq: Two times a day (BID) | ORAL | Status: DC

## 2021-11-15 MED ORDER — HYDROCODONE-ACETAMINOPHEN 5-325 MG PO TABS: 1.0000 | ORAL_TABLET | ORAL | 0 refills | Status: DC | PRN

## 2021-11-15 MED ORDER — METOPROLOL TARTRATE 50 MG PO TABS: 50.0000 mg | ORAL_TABLET | Freq: Two times a day (BID) | ORAL | 3 refills | Status: DC

## 2021-11-15 NOTE — Progress Notes (Signed)
Mobility Specialist Progress Note   11/15/21 1720  Mobility  Activity Transferred from chair to bed  Level of Assistance Moderate assist, patient does 50-74%  Assistive Device Front wheel walker  Distance Ambulated (ft) 2 ft  Activity Response Tolerated well  $Mobility charge 1 Mobility   Pt feeling better on this afternoon and having color return to face. Min - modA for SPT to EOB d/t gen weakness. Left in bed w/ no complaints and call bell by side.  Holland Falling Mobility Specialist Phone Number [phone]

## 2021-11-15 NOTE — Progress Notes (Signed)
Pt experiencing tachycardia, Morning meds were given, Will continue to monitor prior to D/C may not be medically stable yet. Will notify PA.   Chrisandra Carota, RN 11/15/2021 10:58 AM

## 2021-11-15 NOTE — Progress Notes (Signed)
Physical Therapy Treatment Patient Details Name: Mary Chambers MRN: [id number] DOB: 03-Oct-1970 Today's Date: 11/15/2021   History of Present Illness The pt is a 51 yo female presenting 2/6 for aortic valve replacement. 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    PT Comments    Patient not progressing towards PT goals today secondary to lightheadedness in standing. See BP below. Supine BP 113/81 Sitting BP 119/81 Standing BP 88/49 Sitting post transfer 102/79 HR ranging from 118-130 bpm with activity. Pt lightheaded and seeing stars in standing limiting standing tolerance. Rn/Cardiothoracic PA notified of above. Pt requires Mod A of 2 to stand from elevated surface. Education on importance of sitting upright today to improve overall BP with mobility. Able tot ake a few steps but otherwise limited. Will follow.   Recommendations for follow up therapy are one component of a multi-disciplinary discharge planning process, led by the attending physician.  Recommendations may be updated based on patient status, additional functional criteria and insurance authorization.  Follow Up Recommendations  Home health PT     Assistance Recommended at Discharge Intermittent Supervision/Assistance  Patient can return home with the following Assistance with cooking/housework;Help with stairs or ramp for entrance;Assist for transportation;A lot of help with walking and/or transfers;A little help with bathing/dressing/bathroom   Equipment Recommendations  BSC/3in1    Recommendations for Other Services       Precautions / Restrictions Precautions Precautions: Fall;Sternal Precaution Booklet Issued: Yes (comment) Precaution Comments: Reviewed "move in the tube" and precautions Restrictions Weight Bearing Restrictions: Yes Other Position/Activity Restrictions: sternal     Mobility  Bed Mobility Overal bed mobility:  Needs Assistance Bed Mobility: Rolling, Sidelying to Sit Rolling: Supervision Sidelying to sit: Supervision, HOB elevated       General bed mobility comments: HOB very elevated, exited on left side of bed to simulate home; adhering to precautions. No dizziness.    Transfers Overall transfer level: Needs assistance Equipment used: Rolling walker (2 wheels) Transfers: Sit to/from Stand, Bed to chair/wheelchair/BSC Sit to Stand: Mod assist, +2 physical assistance   Step pivot transfers: Min guard       General transfer comment: Mod A of 2 to power to standing from elevated bed height with cues for anterior weight shift and momentum, stood from EOB x2, not able to tolerate upright for long due to lightheadedness and seeing stars per report needing to sit. BP dropped to 88/49. Able to step pivot to get to chair with Min guard assist. RN notified.    Ambulation/Gait               General Gait Details: Deferred due to symptomatic hypotension.   Stairs             Wheelchair Mobility    Modified Rankin (Stroke Patients Only)       Balance Overall balance assessment: Needs assistance Sitting-balance support: Feet supported, No upper extremity supported Sitting balance-Leahy Scale: Fair     Standing balance support: During functional activity, Reliant on assistive device for balance Standing balance-Leahy Scale: Poor                              Cognition Arousal/Alertness: Awake/alert Behavior During Therapy: WFL for tasks assessed/performed Overall Cognitive Status: Within Functional Limits for tasks assessed  General Comments: anxious about going home when having these episodes, "it scared me."        Exercises      General Comments General comments (skin integrity, edema, etc.): Son present. Supine BP113/81, sitting BP 119/81, standing BP 88/49, sitting post transfer 102/79, HR ranging from  118-130 bpm with activity. Pt lightheaded and seeing stars in standing limiting standing tolerance.      Pertinent Vitals/Pain Pain Assessment Pain Assessment: No/denies pain    Home Living                          Prior Function            PT Goals (current goals can now be found in the care plan section) Progress towards PT goals: Not progressing toward goals - comment (due to drop in BP and lightheadedness)    Frequency    Min 3X/week      PT Plan Current plan remains appropriate    Co-evaluation              AM-PAC PT "6 Clicks" Mobility   Outcome Measure  Help needed turning from your back to your side while in a flat bed without using bedrails?: A Little Help needed moving from lying on your back to sitting on the side of a flat bed without using bedrails?: A Little Help needed moving to and from a bed to a chair (including a wheelchair)?: A Lot Help needed standing up from a chair using your arms (e.g., wheelchair or bedside chair)?: A Lot Help needed to walk in hospital room?: A Little Help needed climbing 3-5 steps with a railing? : A Lot 6 Click Score: 15    End of Session Equipment Utilized During Treatment: Gait belt Activity Tolerance: Treatment limited secondary to medical complications (Comment) (orthostatic hypotension) Patient left: in chair;with call bell/phone within reach;with family/visitor present Nurse Communication: Mobility status;Other (comment) (BP) PT Visit Diagnosis: Other abnormalities of gait and mobility (R26.89);Muscle weakness (generalized) (M62.81)     Time: 2111-5520 PT Time Calculation (min) (ACUTE ONLY): 17 min  Charges:  $Therapeutic Activity: 8-22 mins                     Marisa Severin, PT, DPT Acute Rehabilitation Services Pager [phone] Office [phone]      Mary Chambers 11/15/2021, 11:36 AM

## 2021-11-15 NOTE — Progress Notes (Signed)
Sutures removed, patient tolerated well.    Chrisandra Carota, RN 11/15/2021 10:09 AM

## 2021-11-15 NOTE — Progress Notes (Signed)
CARDIAC REHAB PHASE I   PRE:  Rate/Rhythm: 100 ST    BP: lying 105/71, sitting 97/77, standing 96/71    SaO2: 93 RA  MODE:  Ambulation: to BSC   POST:  Rate/Rhythm: 115 ST    BP: sitting 82/61, recheck in recliner 97/76     SaO2:   Pt agreed to get out of bed and try to walk again. Not orthostatic initially with standing (rocked with mod assist from raised bed). To BSC for BM. Cleaned herself. Upon standing felt weak. To recliner and BP low now, pt pale. Resolved with rest. Feet elevated, encouraged being in recliner for a while. [place], ACSM 11/15/2021 2:39 PM

## 2021-11-15 NOTE — Progress Notes (Signed)
° °   °  [address]       [place] [address]             [phone]      8 Days Post-Op Procedure(s) (LRB): AORTIC VALVE REPLACEMENT USING A 23MM On-X AORTIC VALVE. (N/A) TRANSESOPHAGEAL ECHOCARDIOGRAM (TEE) (N/A)  Subjective:  Patient states she is doing well.  She does note her heart rate elevates when she gets up and moves around.  + ambulation  + BM  Objective: Vital signs in last 24 hours: Temp:  [97.7 F (36.5 C)-98.7 F (37.1 C)] 98.7 F (37.1 C) (02/14 0418) Pulse Rate:  [91-108] 100 (02/14 0418) Cardiac Rhythm: Normal sinus rhythm (02/14 0350) Resp:  [18-21] 20 (02/14 0418) BP: (94-118)/(59-80) 94/76 (02/14 0418) SpO2:  [91 %-96 %] 96 % (02/14 0418) Weight:  [133.3 kg] 133.3 kg (02/14 0640)  Intake/Output from previous day: 02/13 0701 - 02/14 0700 In: 758 [P.O.:758] Out: 1200 [Urine:1200]  General appearance: alert, cooperative, and no distress Heart: regular rate and rhythm Lungs: clear to auscultation bilaterally Abdomen: soft, non-tender; bowel sounds normal; no masses,  no organomegaly Extremities: edema trace Wound: clean and dry  Lab Results: Recent Labs    11/12/21 0805 11/13/21 0113  WBC 6.8 8.5  HGB 7.7* 8.4*  HCT 24.3* 26.1*  PLT 183 223   BMET:  Recent Labs    11/12/21 0805 11/13/21 0113  NA 140 142  K 4.6 4.5  CL 104 103  CO2 29 30  GLUCOSE 99 143*  BUN 18 19  CREATININE 0.98 0.99  CALCIUM 8.6* 8.6*    PT/INR:  Recent Labs    11/15/21 0200  LABPROT 27.0*  INR 2.5*   ABG    Component Value Date/Time   PHART 7.295 (L) 11/07/2021 1810   HCO3 22.6 11/07/2021 1810   TCO2 24 11/07/2021 1810   ACIDBASEDEF 4.0 (H) 11/07/2021 1810   O2SAT 99.0 11/07/2021 1810   CBG (last 3)  Recent Labs    11/13/21 0631 11/13/21 2111 11/14/21 0609  GLUCAP 87 158* 148*    Assessment/Plan: S/P Procedure(s) (LRB): AORTIC VALVE REPLACEMENT USING A 23MM On-X AORTIC VALVE. (N/A) TRANSESOPHAGEAL ECHOCARDIOGRAM (TEE)  (N/A)  CV-Sinus Tachycardia, will increase Lopressor to 50 mg BID INR 2.5, will decrease coumadin to 5 mg daily Pulm- off oxygen, continue IS Renal- creatinine has been stable, weight is clearly not correct as patient did not gain 40 lbs overnight, will resume home regimen of Lasix at discharge Deconditioning- H/H arranged Dispo- patient stable, INR is now therapeutic, titrate Lopressor for additional HR control, will d/c home today   LOS: 8 days   Ellwood Handler, PA-C 11/15/2021

## 2021-11-15 NOTE — TOC Transition Note (Signed)
Transition of Care (TOC) - CM/SW Discharge Note [name] RN, BSN Transitions of Care Unit 4E- RN Case Manager See Treatment Team for direct phone #    Patient Details  Name: Mary Chambers MRN: [id number] Date of Birth: Jun 27, 1971  Transition of Care [facility]) CM/SW Contact:  Dawayne Patricia, RN Phone Number: 11/15/2021, 10:34 AM   Clinical Narrative:    Pt stable for transition home today, Per pt request order has been placed for 3n1. [employer] called for DME need- and 3n1 to be delivered to room. RW has already been delivered to room by [employer] last week.   Spoke with Malachy Mood at [facility] regarding [facility] referral from 2/10. Per Malachy Mood pt will have a $120 copay per visit until deductible is met. [facility] wants to be sure pt is aware of copay and agreeable vs maybe doing outpt if she has transportation where copay would be less.   CM spoke with pt at bedside regarding [facility] copay cost vs outpt- per pt she would not be able to get to outpt therapy at this time and prefers to have HHPT. Pt has been told copay cost and is agreeable to this. Pt also shared that she has a secondary insurance- Medicaid and asks if they can also be billed. Will provide this info to [facility] and to check.   Call made back to [facility] with [facility] to inform that pt is agreeable to copay cost and to update on secondary insurance- per Malachy Mood they should be able to bill both. Referral has been accepted and they will contact pt to schedule start of care visit this week.   Pt's son at bedside to provide transport home.    Final next level of care: [place] Barriers to Discharge: Barriers Resolved   Patient Goals and CMS Choice Patient states their goals for this hospitalization and ongoing recovery are:: return home CMS Medicare.gov Compare Post Acute Care list provided to:: Patient Choice offered to / list presented to : Patient  Discharge Placement               Home w/ [facility]         Discharge Plan and Services   Discharge Planning Services: CM Consult Post Acute Care Choice: Home Health, Durable Medical Equipment          DME Arranged: 3-N-1, [place] rolling DME Agency: [employer] Date DME Agency Contacted: 11/15/21 Time DME Agency Contacted: 1000 Representative spoke with at DME Agency: sheila HH Arranged: PT [place]: [place] Date [place]: 11/15/21 Time [place]: 13 Representative spoke with at [place]: [place]) Interventions     Readmission Risk Interventions Readmission Risk Prevention Plan 11/11/2021 10/03/2019  Transportation Screening Complete Complete  PCP or Specialist Appt within 5-7 Days Complete -  PCP or Specialist Appt within 3-5 Days - Not Complete  Not Complete comments - pt to arrange due to [place] Screening Complete -  Medication Review (RN CM) Complete -  HRI or [place] - Complete  Social Work Consult for [place] Planning/Counseling - Complete  Palliative Care Screening - Not Applicable  Medication Review Press photographer) - Referral to Pharmacy  Some recent data might be hidden

## 2021-11-15 NOTE — Progress Notes (Signed)
Pt started to experience blurry vision, BP was checked and stable 105/71. Pt repositioned and blurriness went away. NIH was 0.   Chrisandra Carota, RN 11/15/2021 1:47 PM

## 2021-11-16 ENCOUNTER — Other Ambulatory Visit: Payer: Self-pay | Admitting: Physician Assistant

## 2021-11-16 ENCOUNTER — Other Ambulatory Visit: Payer: Self-pay | Admitting: *Deleted

## 2021-11-16 LAB — PROTIME-INR
INR: 3 — ABNORMAL HIGH (ref 0.8–1.2)
Prothrombin Time: 31 seconds — ABNORMAL HIGH (ref 11.4–15.2)

## 2021-11-16 LAB — GLUCOSE, CAPILLARY: Glucose-Capillary: 87 mg/dL (ref 70–99)

## 2021-11-16 MED ORDER — WARFARIN SODIUM 5 MG PO TABS: 5.0000 mg | ORAL_TABLET | Freq: Every day | ORAL | Status: DC

## 2021-11-16 MED ORDER — SODIUM CHLORIDE 0.9 % IV BOLUS
500.0000 mL | Freq: Once | INTRAVENOUS | Status: AC
  Administered 2021-11-16: 500 mL via INTRAVENOUS

## 2021-11-16 MED ORDER — HYDROCODONE-ACETAMINOPHEN 5-325 MG PO TABS: 1.0000 | ORAL_TABLET | ORAL | 0 refills | Status: AC | PRN

## 2021-11-16 NOTE — Plan of Care (Signed)
  Problem: Education: Goal: Knowledge of General Education information will improve Description: Including pain rating scale, medication(s)/side effects and non-pharmacologic comfort measures Outcome: Adequate for Discharge   Problem: Health Behavior/Discharge Planning: Goal: Ability to manage health-related needs will improve Outcome: Adequate for Discharge   Problem: Clinical Measurements: Goal: Ability to maintain clinical measurements within normal limits will improve Outcome: Adequate for Discharge Goal: Will remain free from infection Outcome: Adequate for Discharge Goal: Diagnostic test results will improve Outcome: Adequate for Discharge Goal: Respiratory complications will improve Outcome: Adequate for Discharge Goal: Cardiovascular complication will be avoided Outcome: Adequate for Discharge   Problem: Activity: Goal: Risk for activity intolerance will decrease Outcome: Adequate for Discharge   Problem: Nutrition: Goal: Adequate nutrition will be maintained Outcome: Adequate for Discharge   Problem: Elimination: Goal: Will not experience complications related to bowel motility Outcome: Adequate for Discharge Goal: Will not experience complications related to urinary retention Outcome: Adequate for Discharge   Problem: Pain Managment: Goal: General experience of comfort will improve Outcome: Adequate for Discharge   Problem: Safety: Goal: Ability to remain free from injury will improve Outcome: Adequate for Discharge   Problem: Skin Integrity: Goal: Risk for impaired skin integrity will decrease Outcome: Adequate for Discharge   

## 2021-11-16 NOTE — Progress Notes (Signed)
° °   °  [address]       [place] [address]             [phone]      9 Days Post-Op Procedure(s) (LRB): AORTIC VALVE REPLACEMENT USING A 23MM On-X AORTIC VALVE. (N/A) TRANSESOPHAGEAL ECHOCARDIOGRAM (TEE) (N/A)  Subjective:  Patient feeling much better this morning.  She was able to get up and walk yesterday w/o difficulty.    Objective: Vital signs in last 24 hours: Temp:  [98.2 F (36.8 C)-98.7 F (37.1 C)] 98.2 F (36.8 C) (02/15 0400) Pulse Rate:  [93-110] 93 (02/15 0000) Cardiac Rhythm: Normal sinus rhythm (02/15 0400) Resp:  [17-20] 18 (02/15 0400) BP: (95-112)/(68-76) 112/76 (02/15 0000) SpO2:  [92 %-95 %] 95 % (02/15 0400) Weight:  [112.5 kg] 112.5 kg (02/15 0614)  Intake/Output from previous day: 02/14 0701 - 02/15 0700 In: 83 [P.O.:590] Out: 800 [Urine:800]  General appearance: alert, cooperative, and no distress Heart: regular rate and rhythm Lungs: clear to auscultation bilaterally Abdomen: soft, non-tender; bowel sounds normal; no masses,  no organomegaly Extremities: extremities normal, atraumatic, no cyanosis or edema Wound: clean and dry  Lab Results: No results for input(s): WBC, HGB, HCT, PLT in the last 72 hours. BMET: No results for input(s): NA, K, CL, CO2, GLUCOSE, BUN, CREATININE, CALCIUM in the last 72 hours.  PT/INR:  Recent Labs    11/16/21 0143  LABPROT 31.0*  INR 3.0*   ABG    Component Value Date/Time   PHART 7.295 (L) 11/07/2021 1810   HCO3 22.6 11/07/2021 1810   TCO2 24 11/07/2021 1810   ACIDBASEDEF 4.0 (H) 11/07/2021 1810   O2SAT 99.0 11/07/2021 1810   CBG (last 3)  Recent Labs    11/13/21 2111 11/14/21 0609  GLUCAP 158* 148*    Assessment/Plan: S/P Procedure(s) (LRB): AORTIC VALVE REPLACEMENT USING A 23MM On-X AORTIC VALVE. (N/A) TRANSESOPHAGEAL ECHOCARDIOGRAM (TEE) (N/A)  CV- Sinus Tachycardia, labile BP running 90-110s- I suspect this is related to my dehydration due to overdiuresis.Marland Kitchen on  Lopressor 25 mg BID INR 3.0- continue coumadin at 5 mg daily Pulm- no acute issues, continue IS Renal- weight is below baseline, no edema on exam, I suspect she has been over diuresed.. will give a 500 cc NS bolus prior to discharge.. I have made Lasix prn regimen at discharge Deconditioning- H/H arranged Dispo- patient feeling much better, ambulated yesterday w/o difficulty, will give fluid bolus as I suspect she is mildly dehydrated from overdiuresis, will d/c home today   LOS: 9 days    Ellwood Handler, PA-C 11/16/2021

## 2021-11-16 NOTE — Progress Notes (Signed)
Pt d/c from 4E to home with son. Discharge instructions and medication education provided. Tele and IV removed.  Raelyn Number, RN

## 2021-11-17 ENCOUNTER — Ambulatory Visit (INDEPENDENT_AMBULATORY_CARE_PROVIDER_SITE_OTHER): Payer: 59

## 2021-11-17 ENCOUNTER — Other Ambulatory Visit: Payer: Self-pay

## 2021-11-17 DIAGNOSIS — Z952 Presence of prosthetic heart valve: Secondary | ICD-10-CM | POA: Diagnosis not present

## 2021-11-17 DIAGNOSIS — I351 Nonrheumatic aortic (valve) insufficiency: Secondary | ICD-10-CM | POA: Diagnosis not present

## 2021-11-17 DIAGNOSIS — Z7901 Long term (current) use of anticoagulants: Secondary | ICD-10-CM | POA: Insufficient documentation

## 2021-11-17 LAB — POCT INR: INR: 1.5 — AB (ref 2.0–3.0)

## 2021-11-17 NOTE — Patient Instructions (Addendum)
Description   Take 1.5 tablets (7.5mg ) of Warfarin today, then resume same dosage of 1 tablet (5mg ) daily.  Keep the number of servings of green leafy vegetables consistent in your diet. Coumadin Clinic [phone] call with medication changes and or bleeding.    A full discussion of the nature of anticoagulants has been carried out.  A benefit risk analysis has been presented to the patient, so that they understand the justification for choosing anticoagulation at this time. The need for frequent and regular monitoring, precise dosage adjustment and compliance is stressed.  Side effects of potential bleeding are discussed.  The patient should avoid any OTC items containing aspirin or ibuprofen, and should avoid great swings in general diet.  Avoid alcohol consumption.  Call if any signs of abnormal bleeding.

## 2021-11-18 ENCOUNTER — Ambulatory Visit (INDEPENDENT_AMBULATORY_CARE_PROVIDER_SITE_OTHER): Payer: Self-pay | Admitting: Thoracic Surgery (Cardiothoracic Vascular Surgery)

## 2021-11-18 ENCOUNTER — Telehealth: Payer: Self-pay | Admitting: Thoracic Surgery (Cardiothoracic Vascular Surgery)

## 2021-11-18 VITALS — BP 127/82 | HR 104 | Resp 20

## 2021-11-18 DIAGNOSIS — Z952 Presence of prosthetic heart valve: Secondary | ICD-10-CM

## 2021-11-18 NOTE — Progress Notes (Signed)
° °   °  [address]       [place] [address]             [phone]        Mary Chambers Mary Chambers Medical Record #[id number] Date of Birth: 1971/09/29  Referring: [name],* Primary Care: [name], NP Primary Cardiologist:Bridgette Adwoa Axe Gave, MD  Reason for visit:   follow-up  History of Present Illness:     51 year old female presents for 1 week follow-up appointment after undergoing aortic valve replacement with mechanical valve.  Overall she is doing well.  Her energy is improving.  Her last INR was a little bit low at 1.5, but she continues to meet with the Coumadin clinic for adjustments.  Physical Exam: BP 127/82 (BP Location: Left Wrist, Patient Position: Sitting)    Pulse (!) 104    Resp 20    SpO2 91%   Alert NAD Incision clean.  Sternum stable Mildly tachycardic.  No murmur. Abdomen, ND Trace peripheral edema       Assessment / Plan:   51 year old female status post aortic valve replacement with mechanical valve for severe aortic valve regurgitation.  Overall doing well.  Patient will continue to follow-up with Coumadin clinic for adjustments for her INR.  She will follow-up with me in 1 month with a chest x-ray.   Mary Chambers 11/18/2021 1:50 PM

## 2021-11-22 ENCOUNTER — Other Ambulatory Visit: Payer: Self-pay

## 2021-11-22 ENCOUNTER — Ambulatory Visit (INDEPENDENT_AMBULATORY_CARE_PROVIDER_SITE_OTHER): Payer: 59

## 2021-11-22 DIAGNOSIS — Z7901 Long term (current) use of anticoagulants: Secondary | ICD-10-CM

## 2021-11-22 DIAGNOSIS — I351 Nonrheumatic aortic (valve) insufficiency: Secondary | ICD-10-CM | POA: Diagnosis not present

## 2021-11-22 DIAGNOSIS — Z952 Presence of prosthetic heart valve: Secondary | ICD-10-CM | POA: Diagnosis not present

## 2021-11-22 LAB — POCT INR: INR: 2.2 (ref 2.0–3.0)

## 2021-11-22 NOTE — Patient Instructions (Signed)
Description   Continue on same dosage of Warfarin 1 tablet (5mg ) daily.  Keep the number of servings of green leafy vegetables consistent in your diet. Coumadin Clinic [phone] call with medication changes and or bleeding.

## 2021-11-23 ENCOUNTER — Telehealth (HOSPITAL_COMMUNITY): Payer: Self-pay

## 2021-11-23 NOTE — Telephone Encounter (Signed)
Pt insurance is active and benefits verified through [facility]. Co-pay $25.00, DED $3,000.00/$3,000.00 met, out of pocket $5,500.00/$5,500.00 met, co-insurance 0%. No pre-authorization required. Passport, 11/23/21 @ 12:19PM, HRC#[id number]   2ndary insurance is active and benefits verified through Medicaid. Co-pay $0.00, DED $0.00/$0.00 met, out of pocket $0.00/$0.00 met, co-insurance 0%. No pre-authorization required. Passport, 11/23/21 @ 12:21PM, YQM#[id number]    Will contact patient to see if she is interested in the Cardiac Rehab Program. If interested, patient will need to complete follow up appt. Once completed, patient will be contacted for scheduling upon review by the RN Navigator.

## 2021-11-23 NOTE — Telephone Encounter (Signed)
Attempted to call patient in regards to Cardiac Rehab - LM on VM 

## 2021-11-28 NOTE — Progress Notes (Signed)
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  Past Medical History    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   Arthritis    Depression    Hypertension    Kidney stone    Obesity    Past Surgical History:  Procedure Laterality Date   ABDOMINAL HYSTERECTOMY     AORTIC VALVE REPLACEMENT N/A 11/07/2021   Procedure: AORTIC VALVE REPLACEMENT USING A 23MM On-X AORTIC VALVE.;  Surgeon: Lajuana Matte, MD;  Location: [facility];  Service: Open Heart Surgery;  Laterality: N/A;   BACK SURGERY     BUBBLE STUDY  08/31/2021   Procedure: BUBBLE STUDY;  Surgeon: Freada Bergeron, MD;  Location: [facility];  Service: Cardiovascular;;   CARPAL TUNNEL RELEASE     CHOLECYSTECTOMY     IR FLUORO GUIDED NEEDLE PLC ASPIRATION/INJECTION LOC  06/23/2021   LITHOTRIPSY     neck fusion     RIGHT/LEFT HEART CATH AND CORONARY ANGIOGRAPHY N/A 10/07/2021   Procedure: RIGHT/LEFT HEART CATH AND CORONARY ANGIOGRAPHY;  Surgeon: Jettie Booze, MD;  Location: [facility];  Service: Cardiovascular;  Laterality: N/A;   TEE WITHOUT CARDIOVERSION N/A 06/28/2021   Procedure: TRANSESOPHAGEAL ECHOCARDIOGRAM (TEE);  Surgeon: Lelon Perla, MD;  Location[facility] ENDOSCOPY;  Service: Cardiovascular;  Laterality: N/A;   TEE WITHOUT CARDIOVERSION N/A 08/31/2021   Procedure: TRANSESOPHAGEAL ECHOCARDIOGRAM (TEE);  Surgeon: Freada Bergeron, MD;  Location: [facility];  Service: Cardiovascular;  Laterality: N/A;   TEE WITHOUT  CARDIOVERSION N/A 11/07/2021   Procedure: TRANSESOPHAGEAL ECHOCARDIOGRAM (TEE);  Surgeon: Lajuana Matte, MD;  Location: [facility];  Service: Open Heart Surgery;  Laterality: N/A;   TUBAL LIGATION      Allergies  Allergies  Allergen Reactions   Baclofen Hives   Dilaudid [Hydromorphone] Itching   Doxycycline Itching   Percocet [Oxycodone-Acetaminophen] Hives   Septra [Sulfamethoxazole-Trimethoprim] Hives   Gabapentin Rash    History of Present Illness    51 year old female with the above past medical history including 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  She has a history of prior aortic valve endocarditis in September 2022 in the setting of paraspinal abscess which required drainage, treated with antibiotics.  TEE in September 2020 showed bicuspid aortic valve, severe aortic valve regurgitation, EF 60 to 65%.  Catheterization in January 2023 showed no evidence of CAD.  Repeat echocardiogram in February 2023 showed EF 55 to 60%, severe eccentric AR.  She was referred to CT surgery and underwent aortic valve replacement with ON-X mechanical aortic valve on 11/07/2021.  She was started on Coumadin discharged home in stable condition on 11/16/2021.  INR was 2.2 on 11/22/2021, she is following in the Coumadin clinic. She saw Dr. Kipp Brood in the office on 11/18/2021 and was stable overall.   She presents today for follow-up accompanied by her son. Since her last visit she has done well from a cardiac standpoint. She is working on increasing her activity. She is somewhat  tearful discussing her recent medical problems and also states she is grieving the loss of her best friend who died last week. She states she had a corneal abrasion that occurred intraoperatively and she has follow-up scheduled with ophthalmology to address this.  Additionally, she has tried to contact her PCP for refills of  her Protonix but has not been unsuccessful.  She is asking if this can be refilled today.  Otherwise, she reports feeling well and denies any specific concerns or complaints today.  Home Medications    Current Outpatient Medications  Medication Sig Dispense Refill   amoxicillin (AMOXIL) 500 MG tablet Take 4 tablets (2,000 mg total) by mouth as needed (Take 30-60 Minutes prior to dental work). 4 tablet 2   Ascorbic Acid (VITAMIN C) 500 MG CAPS Take 500 mg by mouth daily.     aspirin EC 81 MG EC tablet Take 1 tablet (81 mg total) by mouth daily. Swallow whole. 30 tablet 11   atorvastatin (LIPITOR) 10 MG tablet Take 10 mg by mouth daily.     cholecalciferol (VITAMIN D3) 25 MCG (1000 UT) tablet Take 1,000 Units by mouth daily.     fluticasone (FLONASE) 50 MCG/ACT nasal spray Place 2 sprays into both nostrils daily. (Patient taking differently: Place 2 sprays into both nostrils daily as needed for allergies.) 16 g 6   loperamide (IMODIUM) 2 MG capsule Take 1 capsule (2 mg total) by mouth as needed for diarrhea or loose stools. 30 capsule 0   Multiple Vitamin (MULTIVITAMIN) capsule Take 1 capsule by mouth daily.      Potassium (POTASSIMIN PO) Take 500 mg by mouth daily. 250 per tab     tiZANidine (ZANAFLEX) 4 MG tablet Take 4 mg by mouth at bedtime as needed for muscle spasms.     warfarin (COUMADIN) 5 MG tablet Take 1 tablet (5 mg total) by mouth daily at 4 PM. 30 tablet 3   furosemide (LASIX) 20 MG tablet Take 1 tablet (20 mg total) by mouth daily as needed for fluid or edema. For weight gain of 3 lbs in 1 day or 5 lbs in 2 30 tablet 6   metoprolol tartrate (LOPRESSOR) 25 MG tablet Take 1 tablet (25 mg total) by mouth 2 (two) times daily. 180 tablet 3   pantoprazole (PROTONIX) 40 MG tablet Take 1 tablet (40 mg total) by mouth daily. 90 tablet 3   No current facility-administered medications for this visit.     Review of Systems    She denies chest pain, palpitations, dyspnea, pnd, orthopnea, n,  v, dizziness, syncope, edema, weight gain, or early satiety. All other systems reviewed and are otherwise negative except as noted above.     Cardiac Rehabilitation Eligibility Assessment  The patient is ready to start cardiac rehabilitation pending clearance from the cardiac surgeon.    Physical Exam    VS:  BP 114/70    Pulse 90    Ht 5\' 7"  (1.702 m)    Wt 258 lb (117 kg)    SpO2 94%    BMI 40.41 kg/m   GEN: Well nourished, well developed, in no acute distress. HEENT: normal. Neck: Supple, no JVD, carotid bruits, or masses. Cardiac: RRR, no murmurs, rubs, or gallops. No clubbing, cyanosis, edema.  Radials/DP/PT 2+ and equal bilaterally.  Respiratory:  Respirations regular and unlabored, clear to auscultation bilaterally. GI: Soft, nontender, nondistended, BS + x 4. MS: no deformity or atrophy. Skin: warm and dry, no rash.  Surgical site with  approximated edges, clean, dry, intact. Neuro:  Strength and sensation are intact. Psych: Normal affect.  Accessory Clinical Findings    ECG personally reviewed by me today - Sinus rhythm with occasional PVCs, 90 bpm - no acute changes.  Lab Results  Component Value Date   WBC 8.5 11/13/2021   HGB 8.4 (L) 11/13/2021   HCT 26.1 (L) 11/13/2021   MCV 84.5 11/13/2021   PLT 223 11/13/2021   Lab Results  Component Value Date   CREATININE 0.99 11/13/2021   BUN 19 11/13/2021   NA 142 11/13/2021   K 4.5 11/13/2021   CL 103 11/13/2021   CO2 30 11/13/2021   Lab Results  Component Value Date   ALT 9 11/04/2021   AST 29 11/04/2021   ALKPHOS 59 11/04/2021   BILITOT 1.6 (H) 11/04/2021   Lab Results  Component Value Date   CHOL 91 11/10/2021   HDL 28 (L) 11/10/2021   LDLCALC 49 11/10/2021   TRIG 69 11/10/2021   CHOLHDL 3.3 11/10/2021    Lab Results  Component Value Date   HGBA1C 5.7 (H) 11/04/2021    Assessment & Plan    1. H/o severe aortic valve regurgitation s/p AVR: H/o aortic valve endocarditis in September 2022 in the  setting of paraspinal abscess. Echo in February 2023 showed EF 55 to 60%, severe eccentric AR. S/p AVR with ON-X mechanical aortic valve on 11/07/2021.  Intraoperative TEE showed EF 60 to 65%. She is on Coumadin, denies bleeding. INR was 2.2 on 11/22/2021.  She is due for repeat INR today. Surgical incision site is healing well. Discussed the use of prophylactic antibiotics before dental work and other surgeries. Prescription given for Amoxicillin 2 grams orally one hour before procedure. Repeat echocardiogram scheduled for December 19, 2021, she has follow-up scheduled with Dr. Harrell Gave the same day. Will defer cardiac rehab readiness to CT surgery, though she appears to be ready from a general cardiology standpoint. She has follow-up with CT surgery scheduled for 12/15/2021.  Continue aspirin, Coumadin, Lasix, and metoprolol.  2. Sinus tachycardia: EKG today shows sinus rhythm with occasional PVCs, 90 bpm.  Denies any palpitations, dizziness, presyncope, syncope.  Continue metoprolol as above.  3. Hypertension: BP well controlled. Continue current antihypertensive regimen.   4. Hyperlipidemia:  No recent LDL on file. R/LHC 10/2021 with no CAD, LDL goal less than 100. Monitor managed with PCP. Continue atorvastatin.   5. Post-op anemia: Hemoglobin was 8.4 at time of hospital discharge.  Will repeat CBC today.  6. Obesity: Encouraged increase activity as tolerated.  7. Disposition: F/u as scheduled with Dr. Harrell Gave on 12/19/21.   Lenna Sciara, NP 11/29/2021, 10:43 AM

## 2021-11-29 ENCOUNTER — Other Ambulatory Visit: Payer: Self-pay

## 2021-11-29 ENCOUNTER — Ambulatory Visit (INDEPENDENT_AMBULATORY_CARE_PROVIDER_SITE_OTHER): Payer: 59 | Admitting: *Deleted

## 2021-11-29 ENCOUNTER — Encounter (HOSPITAL_BASED_OUTPATIENT_CLINIC_OR_DEPARTMENT_OTHER): Payer: Self-pay | Admitting: Nurse Practitioner

## 2021-11-29 ENCOUNTER — Ambulatory Visit (INDEPENDENT_AMBULATORY_CARE_PROVIDER_SITE_OTHER): Payer: 59 | Admitting: Nurse Practitioner

## 2021-11-29 VITALS — BP 114/70 | HR 90 | Ht 67.0 in | Wt 258.0 lb

## 2021-11-29 DIAGNOSIS — D649 Anemia, unspecified: Secondary | ICD-10-CM

## 2021-11-29 DIAGNOSIS — I351 Nonrheumatic aortic (valve) insufficiency: Secondary | ICD-10-CM

## 2021-11-29 DIAGNOSIS — E782 Mixed hyperlipidemia: Secondary | ICD-10-CM

## 2021-11-29 DIAGNOSIS — E669 Obesity, unspecified: Secondary | ICD-10-CM

## 2021-11-29 DIAGNOSIS — I10 Essential (primary) hypertension: Secondary | ICD-10-CM

## 2021-11-29 DIAGNOSIS — R000 Tachycardia, unspecified: Secondary | ICD-10-CM | POA: Diagnosis not present

## 2021-11-29 DIAGNOSIS — Z952 Presence of prosthetic heart valve: Secondary | ICD-10-CM

## 2021-11-29 DIAGNOSIS — Z7901 Long term (current) use of anticoagulants: Secondary | ICD-10-CM | POA: Diagnosis not present

## 2021-11-29 LAB — CBC
Hematocrit: 32.5 % — ABNORMAL LOW (ref 34.0–46.6)
Hemoglobin: 10.2 g/dL — ABNORMAL LOW (ref 11.1–15.9)
MCH: 25.4 pg — ABNORMAL LOW (ref 26.6–33.0)
MCHC: 31.4 g/dL — ABNORMAL LOW (ref 31.5–35.7)
MCV: 81 fL (ref 79–97)
Platelets: 497 10*3/uL — ABNORMAL HIGH (ref 150–450)
RBC: 4.01 x10E6/uL (ref 3.77–5.28)
RDW: 13.9 % (ref 11.7–15.4)
WBC: 8.2 10*3/uL (ref 3.4–10.8)

## 2021-11-29 LAB — POCT INR: INR: 2.9 (ref 2.0–3.0)

## 2021-11-29 MED ORDER — METOPROLOL TARTRATE 25 MG PO TABS: 25.0000 mg | ORAL_TABLET | Freq: Two times a day (BID) | ORAL | 3 refills | Status: DC

## 2021-11-29 MED ORDER — AMOXICILLIN 500 MG PO TABS: 2000.0000 mg | ORAL_TABLET | ORAL | 2 refills | Status: DC | PRN

## 2021-11-29 MED ORDER — FUROSEMIDE 20 MG PO TABS: 20.0000 mg | ORAL_TABLET | Freq: Every day | ORAL | 6 refills | Status: DC | PRN

## 2021-11-29 MED ORDER — PANTOPRAZOLE SODIUM 40 MG PO TBEC: 40.0000 mg | DELAYED_RELEASE_TABLET | Freq: Every day | ORAL | 3 refills | Status: DC

## 2021-11-29 NOTE — Patient Instructions (Signed)
Description   Continue taking Warfarin 1 tablet (5mg ) daily.  Keep the number of servings of green leafy vegetables consistent in your diet. Coumadin Clinic [phone] call with medication changes and or bleeding.

## 2021-11-29 NOTE — Patient Instructions (Signed)
Medication Instructions:  Your physician has recommended you make the following change in your medication:   Start: Amoxicillin 2,000mg  as needed 30-60 minutes prior to dental procedures  *If you need a refill on your cardiac medications before your next appointment, please call your pharmacy*   Lab Work: Your physician recommends that you have lab work done today- CBC (3rd floor)   If you have labs (blood work) drawn today and your tests are completely normal, you will receive your results only by: MyChart Message (if you have MyChart) OR A paper copy in the mail If you have any lab test that is abnormal or we need to change your treatment, we will call you to review the results.   Testing/Procedures: Please proceed with scheduled Echocardiogram    Follow-Up: At [facility], you and your health needs are our priority.  As part of our continuing mission to provide you with exceptional heart care, we have created designated Provider Care Teams.  These Care Teams include your primary Cardiologist (physician) and Advanced Practice Providers (APPs -  Physician Assistants and Nurse Practitioners) who all work together to provide you with the care you need, when you need it.  We recommend signing up for the patient portal called "MyChart".  Sign up information is provided on this After Visit Summary.  MyChart is used to connect with patients for Virtual Visits (Telemedicine).  Patients are able to view lab/test results, encounter notes, upcoming appointments, etc.  Non-urgent messages can be sent to your provider as well.   To learn more about what you can do with MyChart, go to [email or web address].    Your next appointment:   Follow up as scheduled with Dr. Harrell Gave

## 2021-11-30 ENCOUNTER — Telehealth (HOSPITAL_BASED_OUTPATIENT_CLINIC_OR_DEPARTMENT_OTHER): Payer: Self-pay

## 2021-11-30 NOTE — Telephone Encounter (Addendum)
Left message for patient to call back ? ? ?----- Message from Lenna Sciara, NP sent at 11/30/2021  9:40 AM EST ----- ?Recent labs shows stable blood counts, hemoglobin is slowly improving. Follow-up as scheduled. Thank you! ?

## 2021-12-06 ENCOUNTER — Other Ambulatory Visit: Payer: Self-pay

## 2021-12-06 ENCOUNTER — Ambulatory Visit (INDEPENDENT_AMBULATORY_CARE_PROVIDER_SITE_OTHER): Payer: 59 | Admitting: *Deleted

## 2021-12-06 DIAGNOSIS — Z952 Presence of prosthetic heart valve: Secondary | ICD-10-CM | POA: Diagnosis not present

## 2021-12-06 DIAGNOSIS — I351 Nonrheumatic aortic (valve) insufficiency: Secondary | ICD-10-CM | POA: Diagnosis not present

## 2021-12-06 DIAGNOSIS — Z7901 Long term (current) use of anticoagulants: Secondary | ICD-10-CM | POA: Diagnosis not present

## 2021-12-06 LAB — POCT INR: INR: 3.3 — AB (ref 2.0–3.0)

## 2021-12-06 NOTE — Patient Instructions (Signed)
Description   ?Today take 1/2 tablet then continue taking Warfarin 1 tablet ('5mg'$ ) daily. Keep the number of servings of green leafy vegetables consistent in your diet. Coumadin Clinic [phone] call with medication changes and or bleeding. ?  ?  ?

## 2021-12-08 ENCOUNTER — Telehealth (HOSPITAL_BASED_OUTPATIENT_CLINIC_OR_DEPARTMENT_OTHER): Payer: Self-pay

## 2021-12-08 ENCOUNTER — Ambulatory Visit (INDEPENDENT_AMBULATORY_CARE_PROVIDER_SITE_OTHER): Payer: 59 | Admitting: Family Medicine

## 2021-12-08 ENCOUNTER — Other Ambulatory Visit: Payer: Self-pay

## 2021-12-08 VITALS — BP 110/70 | HR 65 | Ht 67.0 in | Wt 251.0 lb

## 2021-12-08 DIAGNOSIS — G4700 Insomnia, unspecified: Secondary | ICD-10-CM | POA: Insufficient documentation

## 2021-12-08 DIAGNOSIS — R7303 Prediabetes: Secondary | ICD-10-CM | POA: Insufficient documentation

## 2021-12-08 DIAGNOSIS — F419 Anxiety disorder, unspecified: Secondary | ICD-10-CM

## 2021-12-08 DIAGNOSIS — I10 Essential (primary) hypertension: Secondary | ICD-10-CM

## 2021-12-08 DIAGNOSIS — F32A Depression, unspecified: Secondary | ICD-10-CM

## 2021-12-08 HISTORY — DX: Insomnia, unspecified: G47.00

## 2021-12-08 MED ORDER — ESCITALOPRAM OXALATE 5 MG PO TABS: 5.0000 mg | ORAL_TABLET | Freq: Every day | ORAL | 1 refills | Status: DC

## 2021-12-08 NOTE — Progress Notes (Signed)
New Patient Office Visit  Subjective:  Patient ID: Mary Chambers, female    DOB: [birth year]  Age: 51 y.o. MRN: [id number]  CC:  Chief Complaint  Patient presents with   New Patient (Initial Visit)    Not sleeping at night.    HPI Mary Chambers is a 51 yo female presenting to establish in clinic.  She has current concerns as outlined above.  Past medical history is significant for hypertension, aortic regurg status post AVR, depression/anxiety.  Hypertension: Not checking her blood pressure regularly at home, does continue with metoprolol at present.  She does follow with cardiology due to recent issue with aortic valve endocarditis, regurgitation, aortic valve replacement.  Aortic valve replacement due to regurgitation: Patient recently found to have aortic valve endocarditis, aortic valve regurgitation and underwent aortic valve replacement.  She has been on anticoagulation with warfarin since that time, does follow-up with cardiology regarding management of this.  On chart review, patient also with slightly elevated hemoglobin A1c at 5.7%, within prediabetes range  Insomnia: Patient reports that this has been a more prominent issue over the past month since her hospitalization.  She indicates that she is only been sleeping about 3 to 4 hours at night.  Is having trouble falling asleep, feels that her mind is racing and keeping her from being able to fall asleep at night.  She indicates that today she did not fall asleep until after 4 AM and then woke up about 4 hours later for the day.  She does endorse history of anxiety and depression, at one point she was taking Zoloft in the past, however she felt that she had no emotions while on the medication.  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   Arthritis    Depression    Hypertension    Kidney stone    Obesity     Past Surgical History:   Procedure Laterality Date   ABDOMINAL HYSTERECTOMY     AORTIC VALVE REPLACEMENT N/A 11/07/2021   Procedure: AORTIC VALVE REPLACEMENT USING A 23MM On-X AORTIC VALVE.;  Surgeon: Lajuana Matte, MD;  Location: [place];  Service: Open Heart Surgery;  Laterality: N/A;   BACK SURGERY     BUBBLE STUDY  08/31/2021   Procedure: BUBBLE STUDY;  Surgeon: Freada Bergeron, MD;  Location: [place];  Service: Cardiovascular;;   CARPAL TUNNEL RELEASE     CHOLECYSTECTOMY     IR FLUORO GUIDED NEEDLE PLC ASPIRATION/INJECTION LOC  06/23/2021   LITHOTRIPSY     neck fusion     RIGHT/LEFT HEART CATH AND CORONARY ANGIOGRAPHY N/A 10/07/2021   Procedure: RIGHT/LEFT HEART CATH AND CORONARY ANGIOGRAPHY;  Surgeon: Jettie Booze, MD;  Location: [place] [facility];  Service: Cardiovascular;  Laterality: N/A;   TEE WITHOUT CARDIOVERSION N/A 06/28/2021   Procedure: TRANSESOPHAGEAL ECHOCARDIOGRAM (TEE);  Surgeon: Lelon Perla, MD;  Location: [facility] ENDOSCOPY;  Service: Cardiovascular;  Laterality: N/A;   TEE WITHOUT CARDIOVERSION N/A 08/31/2021   Procedure: TRANSESOPHAGEAL ECHOCARDIOGRAM (TEE);  Surgeon: Freada Bergeron, MD;  Location: [place];  Service: Cardiovascular;  Laterality: N/A;   TEE WITHOUT CARDIOVERSION N/A 11/07/2021   Procedure: TRANSESOPHAGEAL ECHOCARDIOGRAM (TEE);  Surgeon: Lajuana Matte, MD;  Location: [place];  Service: Open Heart Surgery;  Laterality: N/A;   TUBAL LIGATION      Family History  Problem Relation Age of Onset  Diabetes Mother    Hypertension Mother    Cancer Father    Parkinson's disease Father    Heart disease Father    Heart disease Sister    Heart disease Brother    Colon cancer Neg Hx    Rectal cancer Neg Hx    Stomach cancer Neg Hx     Social History   Socioeconomic History   Marital status: Single    Spouse name: Not on file   Number of children: Not on file   Years of education: Not on file   Highest education level: Not on file   Occupational History   Not on file  Tobacco Use   Smoking status: Never   Smokeless tobacco: Never  Vaping Use   Vaping Use: Never used  Substance and Sexual Activity   Alcohol use: No   Drug use: Never   Sexual activity: Not Currently    Birth control/protection: None  Other Topics Concern   Not on file  Social History Narrative   Not on file   Social Determinants of Health   Financial Resource Strain: Not on file  Food Insecurity: Not on file  Transportation Needs: Not on file  Physical Activity: Not on file  Stress: Not on file  Social Connections: Not on file  Intimate Partner Violence: Not on file   Objective:   Today's Vitals: BP 110/70 (BP Location: Left Wrist, Patient Position: Sitting, Cuff Size: Normal)    Pulse 65    Ht '5\' 7"'$  (1.702 m)    Wt 251 lb (113.9 kg)    SpO2 94%    BMI 39.31 kg/m   Physical Exam  51 year old female in no acute distress Cardiovascular exam with regular rate, mechanical click present Lungs clear to auscultation bilaterally Patient somewhat tearful during encounter discussing history of anxiety and depression as well as recent issues with sleep  Assessment & Plan:   Problem List Items Addressed This Visit       Cardiovascular and Mediastinum   Hypertension, benign - Primary    Blood pressure adequate in office today, can continue with current medications and recommend intermittent monitoring at home, would recommend low-salt diet, particularly with history of aortic regurgitation        Other   Anxiety and depression    Treated previously with Zoloft, felt that she was not herself on the medication and self discontinued We will initiate treatment with low-dose of escitalopram, sent to pharmacy on file Plan for close follow-up in about 2 weeks to monitor for any side effects, progress with medication Will also refer for counseling in conjunction with management of insomnia      Relevant Medications   escitalopram (LEXAPRO)  5 MG tablet   Other Relevant Orders   Ambulatory referral to [place]   Insomnia    Has been a more prominent issue for patient over the past month Discussed options.  We will proceed with use of escitalopram to try to help control underlying anxiety and depression We will also refer to [facility] for counseling services, specifically CBT to try to help with sleep issues and control underlying anxiety and depression      Relevant Orders   Ambulatory referral to [place]   Prediabetes    Patient with slightly elevated A1c at 5.7% about 1 month ago Can work on lifestyle modifications at this time, likely plan to recheck A1c in about 4 to 6 months to monitor  Outpatient Encounter Medications as of 12/08/2021  Medication Sig   amoxicillin (AMOXIL) 500 MG tablet Take 4 tablets (2,000 mg total) by mouth as needed (Take 30-60 Minutes prior to dental work).   Ascorbic Acid (VITAMIN C) 500 MG CAPS Take 500 mg by mouth daily.   aspirin EC 81 MG EC tablet Take 1 tablet (81 mg total) by mouth daily. Swallow whole.   atorvastatin (LIPITOR) 10 MG tablet Take 10 mg by mouth daily.   cholecalciferol (VITAMIN D3) 25 MCG (1000 UT) tablet Take 1,000 Units by mouth daily.   escitalopram (LEXAPRO) 5 MG tablet Take 1 tablet (5 mg total) by mouth daily.   fluticasone (FLONASE) 50 MCG/ACT nasal spray Place 2 sprays into both nostrils daily. (Patient taking differently: Place 2 sprays into both nostrils daily as needed for allergies.)   furosemide (LASIX) 20 MG tablet Take 1 tablet (20 mg total) by mouth daily as needed for fluid or edema. For weight gain of 3 lbs in 1 day or 5 lbs in 2   loperamide (IMODIUM) 2 MG capsule Take 1 capsule (2 mg total) by mouth as needed for diarrhea or loose stools.   metoprolol tartrate (LOPRESSOR) 25 MG tablet Take 1 tablet (25 mg total) by mouth 2 (two) times daily.   Multiple Vitamin (MULTIVITAMIN) capsule Take 1 capsule by mouth daily.     pantoprazole (PROTONIX) 40 MG tablet Take 1 tablet (40 mg total) by mouth daily.   Potassium (POTASSIMIN PO) Take 500 mg by mouth daily. 250 per tab   tiZANidine (ZANAFLEX) 4 MG tablet Take 4 mg by mouth at bedtime as needed for muscle spasms.   warfarin (COUMADIN) 5 MG tablet Take 1 tablet (5 mg total) by mouth daily at 4 PM.   [DISCONTINUED] citalopram (CELEXA) 10 MG tablet Take 5 mg by mouth daily.   No facility-administered encounter medications on file as of 12/08/2021.    Follow-up: Return in about 2 weeks (around 12/22/2021).  Plan for follow-up in 2 weeks to monitor treatment with SSRI  Lissie Hinesley J [place], MD

## 2021-12-08 NOTE — Assessment & Plan Note (Signed)
Has been a more prominent issue for patient over the past month ?Discussed options.  We will proceed with use of escitalopram to try to help control underlying anxiety and depression ?We will also refer to [facility] for counseling services, specifically CBT to try to help with sleep issues and control underlying anxiety and depression ?

## 2021-12-08 NOTE — Assessment & Plan Note (Signed)
Patient with slightly elevated A1c at 5.7% about 1 month ago ?Can work on lifestyle modifications at this time, likely plan to recheck A1c in about 4 to 6 months to monitor ?

## 2021-12-08 NOTE — Assessment & Plan Note (Signed)
Blood pressure adequate in office today, can continue with current medications and recommend intermittent monitoring at home, would recommend low-salt diet, particularly with history of aortic regurgitation ?

## 2021-12-08 NOTE — Assessment & Plan Note (Signed)
Treated previously with Zoloft, felt that she was not herself on the medication and self discontinued ?We will initiate treatment with low-dose of escitalopram, sent to pharmacy on file ?Plan for close follow-up in about 2 weeks to monitor for any side effects, progress with medication ?Will also refer for counseling in conjunction with management of insomnia ?

## 2021-12-08 NOTE — Patient Instructions (Addendum)
?  Medication Instructions:  ?Your physician recommends that you continue on your current medications as directed. Please refer to the Current Medication list given to you today. ?--If you need a refill on any your medications before your next appointment, please call your pharmacy first. If no refills are authorized on file call the office.-- ?Lab Work: ?Your physician has recommended that you have lab work today: NONE ?If you have labs (blood work) drawn today and your tests are completely normal, you will receive your results via [place] a phone call from our staff.  ?Please ensure you check your voicemail in the event that you authorized detailed messages to be left on a delegated number. If you have any lab test that is abnormal or we need to change your treatment, we will call you to review the results. ? ?Referrals/Procedures/Imaging:  ?Elias Else, PsyD ? ? ? ?Follow-Up: ?Your next appointment:   ?Your physician recommends that you schedule a follow-up appointment in: 2 weeks with Dr. de [place] ? ?You will receive a text message or e-mail with a link to a survey about your care and experience with [place] today! We would greatly appreciate your feedback!  ? ?Thanks for letting us be apart of your health journey!!  ?Primary Care and Sports Medicine  ? ?Dr. Kyung Rudd de [place]  ? ?We encourage you to activate your patient portal called "MyChart".  Sign up information is provided on this After Visit Summary.  MyChart is used to connect with patients for Virtual Visits (Telemedicine).  Patients are able to view lab/test results, encounter notes, upcoming appointments, etc.  Non-urgent messages can be sent to your provider as well. To learn more about what you can do with MyChart, please visit --  [email or web address].    ?

## 2021-12-08 NOTE — Telephone Encounter (Signed)
-  Cardiac rehab form completed by MD and faxed back to facility.  ?

## 2021-12-12 ENCOUNTER — Other Ambulatory Visit: Payer: Self-pay | Admitting: Thoracic Surgery (Cardiothoracic Vascular Surgery)

## 2021-12-12 DIAGNOSIS — Z952 Presence of prosthetic heart valve: Secondary | ICD-10-CM

## 2021-12-13 ENCOUNTER — Ambulatory Visit (INDEPENDENT_AMBULATORY_CARE_PROVIDER_SITE_OTHER): Payer: 59

## 2021-12-13 ENCOUNTER — Other Ambulatory Visit: Payer: Self-pay

## 2021-12-13 DIAGNOSIS — Z7901 Long term (current) use of anticoagulants: Secondary | ICD-10-CM | POA: Diagnosis not present

## 2021-12-13 DIAGNOSIS — I351 Nonrheumatic aortic (valve) insufficiency: Secondary | ICD-10-CM | POA: Diagnosis not present

## 2021-12-13 DIAGNOSIS — Z952 Presence of prosthetic heart valve: Secondary | ICD-10-CM

## 2021-12-13 LAB — POCT INR: INR: 2.2 (ref 2.0–3.0)

## 2021-12-13 NOTE — Patient Instructions (Signed)
Description   ?Continue taking Warfarin 1 tablet ('5mg'$ ) daily. Keep the number of servings of green leafy vegetables consistent in your diet. Recheck in 2 weeks.  Coumadin Clinic [phone] call with medication changes and or bleeding. ?  ?  ?

## 2021-12-15 ENCOUNTER — Ambulatory Visit
Admission: RE | Admit: 2021-12-15 | Discharge: 2021-12-15 | Disposition: A | Discharge status: Home / Self Care | Payer: 59 | Source: Ambulatory Visit | Attending: Thoracic Surgery (Cardiothoracic Vascular Surgery) | Admitting: Thoracic Surgery (Cardiothoracic Vascular Surgery)

## 2021-12-15 ENCOUNTER — Other Ambulatory Visit: Payer: Self-pay

## 2021-12-15 ENCOUNTER — Ambulatory Visit (INDEPENDENT_AMBULATORY_CARE_PROVIDER_SITE_OTHER): Payer: Self-pay | Admitting: Physician Assistant

## 2021-12-15 VITALS — BP 150/80 | HR 65 | Resp 20 | Ht 67.0 in | Wt 260.0 lb

## 2021-12-15 DIAGNOSIS — I350 Nonrheumatic aortic (valve) stenosis: Secondary | ICD-10-CM

## 2021-12-15 DIAGNOSIS — Z952 Presence of prosthetic heart valve: Secondary | ICD-10-CM

## 2021-12-15 NOTE — Progress Notes (Signed)
? ?   ?[address] ?      [place] [address] ?            [phone]   ? ?  ? ?HPI: ?Patient returns for routine postoperative follow-up having undergone mechanical aortic valve replacement with a 23 On-X valve by Dr. Kipp Brood on 11/07/2021 for severe aortic insufficiency related to aortic valve endocarditis and bicuspid aortic valve.  She had a fairly uneventful postoperative course with no significant arrhythmias.  She was discharged on postop day 9 on Coumadin dosed at 5 mg daily.  Her last INR on 12/13/2021 was 2.2.  This is being managed by the Coumadin clinic.  She remains on Coumadin 5 mg daily dosing. ?Mary Chambers has been seen by her primary care provider Dr. de [place] on 12/08/2021.  At that time, she was having some difficulty with insomnia and anxiety.  She was started on Lexapro and was referred to the psychiatry service for counseling regarding her anxiety. ? ?Since hospital discharge the patient reports continued improvement in her endurance.  She denies any shortness of breath.  She has continued to have some left-sided chest soreness but she says this is improved with wearing a bra to bed.  She occasionally has some lower extremity edema that has not been instructed by her cardiologist to take Lasix as needed when this occurs.  She feels this has been well controlled. ? ? ?Current Outpatient Medications  ?Medication Sig Dispense Refill  ? amoxicillin (AMOXIL) 500 MG tablet Take 4 tablets (2,000 mg total) by mouth as needed (Take 30-60 Minutes prior to dental work). 4 tablet 2  ? Ascorbic Acid (VITAMIN C) 500 MG CAPS Take 500 mg by mouth daily.    ? aspirin EC 81 MG EC tablet Take 1 tablet (81 mg total) by mouth daily. Swallow whole. 30 tablet 11  ? atorvastatin (LIPITOR) 10 MG tablet Take 10 mg by mouth daily.    ? cholecalciferol (VITAMIN D3) 25 MCG (1000 UT) tablet Take 1,000 Units by mouth daily.    ? escitalopram (LEXAPRO) 5 MG tablet Take 1 tablet (5 mg total) by mouth daily.  30 tablet 1  ? fluticasone (FLONASE) 50 MCG/ACT nasal spray Place 2 sprays into both nostrils daily. (Patient taking differently: Place 2 sprays into both nostrils daily as needed for allergies.) 16 g 6  ? furosemide (LASIX) 20 MG tablet Take 1 tablet (20 mg total) by mouth daily as needed for fluid or edema. For weight gain of 3 lbs in 1 day or 5 lbs in 2 30 tablet 6  ? loperamide (IMODIUM) 2 MG capsule Take 1 capsule (2 mg total) by mouth as needed for diarrhea or loose stools. 30 capsule 0  ? metoprolol tartrate (LOPRESSOR) 25 MG tablet Take 1 tablet (25 mg total) by mouth 2 (two) times daily. 180 tablet 3  ? Multiple Vitamin (MULTIVITAMIN) capsule Take 1 capsule by mouth daily.     ? pantoprazole (PROTONIX) 40 MG tablet Take 1 tablet (40 mg total) by mouth daily. 90 tablet 3  ? Potassium (POTASSIMIN PO) Take 500 mg by mouth daily. 250 per tab    ? tiZANidine (ZANAFLEX) 4 MG tablet Take 4 mg by mouth at bedtime as needed for muscle spasms.    ? warfarin (COUMADIN) 5 MG tablet Take 1 tablet (5 mg total) by mouth daily at 4 PM. 30 tablet 3  ? ?No current facility-administered medications for this visit.  ? ? ?Physical Exam: ?Vital  signs ?BP 150/80 ?Pulse 65 ?Respirations 20 ?SPO2 94% on room air ? ?General: Overall looks good no acute distress.  She will come to the office today using a quad cane. ?Heart: Regular rate and rhythm.  Crisp click of mechanical aortic valve. ?Chest: Breath sounds are full, equal, and clear to auscultation.  Chest x-ray showed clear lung fields and no pleural effusions.  The sternotomy incision is well approximated and healing with no sign of complication.  Chest tube sites are okay.   ?Extremities: No peripheral edema ? ? ?Diagnostic Tests: ?CLINICAL DATA:  Status post aortic valve replacement.  Fatigue ?  ?EXAM: ?CHEST - 2 VIEW ?  ?COMPARISON:  Radiograph 2 since 2023, chest CT 07/15/2021 ?  ?FINDINGS: ?Unchanged cardiomediastinal silhouette prior median sternotomy and ?aortic valve  replacement. Intact sternotomy wires. There is no focal ?airspace consolidation. There is no large pleural effusion. There is ?no visible pneumothorax. There is no acute osseous abnormality. ?Fusion hardware in the cervical, lower lumbar, lumbar spine noted. ?  ?IMPRESSION: ?No evidence of acute cardiopulmonary disease. ?  ?  ?Electronically Signed ?  By: Maurine Simmering M.D. ?  On: 12/15/2021 12:52 ? ?Impression / Plan: ? ?-Severe aortic insufficiency: Ms. Freiman appears to be making satisfactory progress following aortic valve replacement for severe AI related to aortic valve endocarditis and bicuspid aortic valve.  INR is therapeutic at 2.2 on a stable dose of Coumadin at 5 mg daily.  Three months post surgery, the INR therapeutic goal may be reduced to 1.5-2.0.  She should continue to observe sternal precautions for another 6 weeks, she may resume driving.  Okay to proceed with cardiac rehab although she tells me there is a 1 to 26-monthwait list for enrolling in the program. ? ?-Hypertension: Blood pressure was 150/80 in the office today although it appears to have been well controlled at her other postoperative visits to her PCP and cardiology.  Will not make any changes with her management plan today but encouraged her to keep a close watch on this and contact the provider her blood pressure remains elevated.   ? ?-Disposition: Progressing well.  May proceed with cardiac rehab.  Observe sternal precautions for another 6 weeks.  Follow-up as needed. ? ?MAntony Odea PA-C ?[facility] ?[phone] ? ?

## 2021-12-15 NOTE — Patient Instructions (Signed)
May proceed with cardiac rehab at any time ? ?Continue to observe sternal precautions for another 6 weeks ? ?Follow-up as needed ?

## 2021-12-16 NOTE — Progress Notes (Signed)
?Cardiology Office Note:   ? ?Date:  12/19/2021  ? ?ID:  Mary Chambers, DOB 1971-05-15, MRN [id number] ? ?PCP:  de [place], Raymond J, MD  ?Cardiologist:  [name], MD ? ?Referring MD: [name], NP  ? ?CC: follow up ? ? ?History of Present Illness:   ? ?Mary Chambers is a 51 y.o. female with a hx of hypertension, obesity, arthritis, and depression, who is seen for follow-up.   ? ?She presented to the ED 07/14/2021 with acute chest pain, evaluation unremarkable at that time. I met her during her hospitalization 06/2021 for paraspinal abscesses and endocarditis, with incidental Covid 19 infection at the time. ? ?Today: ?Since her last visit, she underwent right/left heart catheterization 10/07/2021, and aortic valve replacement 11/07/2021. ? ?Overall, she is feeling pretty good. She has been recovering well. Today she was able to drive herself to clinic. There is some soreness in her left chest, but she no longer has pain when lying down on her left side. Currently she is on the month-long waiting list for rehab. ? ?Saturday she had an episode of severe fatigue and tremulousness while in the shower. She believes this was due to low blood pressure. Normally she avoids hot showers. This was her first event since her surgery. She denies any syncope. ? ?Maybe once or twice a week she needs to take PRN lasix for left ankle swelling due to too much sodium in her diet. When she takes lasix she will also take a potassium supplement. Her appetite has not yet returned to baseline. This has been gradually improving, she is conscientious of not eating too much. ? ?She was started on Lexapro for insomnia. This has been helping her significantly. ? ?She denies any palpitations, or shortness of breath. No headaches, orthopnea, or PND. No bleeding issues on coumadin. ? ? ?Past Medical History:  ?Diagnosis Date  ? Aortic regurgitation   ? 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 Arthritis    ? Depression   ? Hypertension   ? Kidney stone   ? Obesity   ? ? ?Past Surgical History:  ?Procedure Laterality Date  ? ABDOMINAL HYSTERECTOMY    ? AORTIC VALVE REPLACEMENT N/A 11/07/2021  ? Procedure: AORTIC VALVE REPLACEMENT USING A 23MM On-X AORTIC VALVE.;  Surgeon: Lajuana Matte, MD;  Location: [facility];  Service: Open Heart Surgery;  Laterality: N/A;  ? BACK SURGERY    ? BUBBLE STUDY  08/31/2021  ? Procedure: BUBBLE STUDY;  Surgeon: Freada Bergeron, MD;  Location: [facility];  Service: Cardiovascular;;  ? CARPAL TUNNEL RELEASE    ? CHOLECYSTECTOMY    ? IR FLUORO GUIDED NEEDLE PLC ASPIRATION/INJECTION LOC  06/23/2021  ? LITHOTRIPSY    ? neck fusion    ? RIGHT/LEFT HEART CATH AND CORONARY ANGIOGRAPHY N/A 10/07/2021  ? Procedure: RIGHT/LEFT HEART CATH AND CORONARY ANGIOGRAPHY;  Surgeon: Jettie Booze, MD;  Location: [facility];  Service: Cardiovascular;  Laterality: N/A;  ? TEE WITHOUT CARDIOVERSION N/A 06/28/2021  ? Procedure: TRANSESOPHAGEAL ECHOCARDIOGRAM (TEE);  Surgeon: Lelon Perla, MD;  Location: [facility];  Service: Cardiovascular;  Laterality: N/A;  ? TEE WITHOUT CARDIOVERSION N/A 08/31/2021  ? Procedure: TRANSESOPHAGEAL ECHOCARDIOGRAM (TEE);  Surgeon: Freada Bergeron, MD;  Location: [facility];  Service: Cardiovascular;  Laterality: N/A;  ? TEE WITHOUT CARDIOVERSION N/A 11/07/2021  ? Procedure: TRANSESOPHAGEAL ECHOCARDIOGRAM (TEE);  Surgeon: Lajuana Matte, MD;  Location: [facility];  Service: Open Heart  Surgery;  Laterality: N/A;  ? TUBAL LIGATION    ? ? ?Current Medications: ?Current Outpatient Medications on File Prior to Visit  ?Medication Sig  ? amoxicillin (AMOXIL) 500 MG tablet Take 4 tablets (2,000 mg total) by mouth as needed (Take 30-60 Minutes prior to dental work).  ? Ascorbic Acid (VITAMIN C) 500 MG CAPS Take 500 mg by mouth daily.  ? aspirin EC 81 MG EC tablet Take 1 tablet (81 mg total) by mouth daily. Swallow whole.  ? atorvastatin (LIPITOR) 10 MG tablet  Take 10 mg by mouth daily.  ? cholecalciferol (VITAMIN D3) 25 MCG (1000 UT) tablet Take 1,000 Units by mouth daily.  ? escitalopram (LEXAPRO) 5 MG tablet Take 1 tablet (5 mg total) by mouth daily.  ? fluticasone (FLONASE) 50 MCG/ACT nasal spray Place 2 sprays into both nostrils daily. (Patient taking differently: Place 2 sprays into both nostrils daily as needed for allergies.)  ? furosemide (LASIX) 20 MG tablet Take 1 tablet (20 mg total) by mouth daily as needed for fluid or edema. For weight gain of 3 lbs in 1 day or 5 lbs in 2  ? loperamide (IMODIUM) 2 MG capsule Take 1 capsule (2 mg total) by mouth as needed for diarrhea or loose stools.  ? metoprolol tartrate (LOPRESSOR) 25 MG tablet Take 1 tablet (25 mg total) by mouth 2 (two) times daily.  ? Multiple Vitamin (MULTIVITAMIN) capsule Take 1 capsule by mouth daily.   ? pantoprazole (PROTONIX) 40 MG tablet Take 1 tablet (40 mg total) by mouth daily.  ? Potassium (POTASSIMIN PO) Take 500 mg by mouth daily. 250 per tab  ? tiZANidine (ZANAFLEX) 4 MG tablet Take 4 mg by mouth at bedtime as needed for muscle spasms.  ? warfarin (COUMADIN) 5 MG tablet Take 1 tablet (5 mg total) by mouth daily at 4 PM.  ? ?No current facility-administered medications on file prior to visit.  ?  ? ?Allergies:   Baclofen, Acetaminophen, Dilaudid [hydromorphone], Doxycycline, Oxycodone hcl, Percocet [oxycodone-acetaminophen], Septra [sulfamethoxazole-trimethoprim], and Gabapentin  ? ?Social History  ? ?Tobacco Use  ? Smoking status: Never  ? Smokeless tobacco: Never  ?Vaping Use  ? Vaping Use: Never used  ?Substance Use Topics  ? Alcohol use: No  ? Drug use: Never  ? ? ?Family History: ?family history includes Cancer in her father; Diabetes in her mother; Heart disease in her brother, father, and sister; Hypertension in her mother; Parkinson's disease in her father. There is no history of Colon cancer, Rectal cancer, or Stomach cancer. ? ?ROS:   ?Please see the history of present  illness. ?(+) Left chest soreness ?(+) Fatigue ?(+) Tremors ?(+) Left ankle swelling ?All other systems are reviewed and negative.  ? ?EKGs/Labs/Other Studies Reviewed:   ? ?The following studies were reviewed today: ? ?Echo 12/19/2021: ?pending ? ?Pre CABG Carotid Dopplers 11/04/2021: ?Summary:  ?Right Carotid: The extracranial vessels were near-normal with only minimal wall thickening or plaque.  ? ?Left Carotid: The extracranial vessels were near-normal with only minimal  ?wall thickening or plaque.  ? ?Vertebrals:  Bilateral vertebral arteries demonstrate antegrade flow.  ?Subclavians: Right subclavian artery flow was disturbed. Normal flow  ?             hemodynamics were seen in the left subclavian artery.  ? ?Right Upper Extremity: Doppler waveforms decrease >50% with right radial compression. Doppler waveforms remain within normal limits with right ulnar compression.  ? ?Left Upper Extremity: Doppler waveforms remain within normal limits with  ?  left radial compression. Doppler waveforms remain within normal limits  ?with left ulnar compression.  ? ?Echo 11/04/2021: ? 1. Left ventricular ejection fraction, by estimation, is 55 to 60%. The  ?left ventricle has normal function. The left ventricle has no regional  ?wall motion abnormalities. The left ventricular internal cavity size was  ?mildly dilated. There is mild  ?concentric left ventricular hypertrophy. Left ventricular diastolic  ?parameters were normal.  ? 2. Right ventricular systolic function is normal. The right ventricular  ?size is normal.  ? 3. Left atrial size was mild to moderately dilated.  ? 4. The mitral valve is grossly normal. Trivial mitral valve  ?regurgitation. No evidence of mitral stenosis.  ? 5. Aortic valve not well visualized on current images but known to be  ?bicuspid from prior study. Severe eccentric AR, see image 41, jet fills  ?LVOT. The aortic valve was not well visualized. There is moderate  ?calcification of the aortic valve.  Aortic  ?valve regurgitation is severe.  ? 6. The inferior vena cava is normal in size with <50% respiratory  ?variability, suggesting right atrial pressure of 8 mmHg.  ? ?Comparison(s): No significant change fr

## 2021-12-19 ENCOUNTER — Encounter (HOSPITAL_BASED_OUTPATIENT_CLINIC_OR_DEPARTMENT_OTHER): Payer: Self-pay | Admitting: Cardiology

## 2021-12-19 ENCOUNTER — Ambulatory Visit (INDEPENDENT_AMBULATORY_CARE_PROVIDER_SITE_OTHER): Payer: 59 | Admitting: Cardiology

## 2021-12-19 ENCOUNTER — Ambulatory Visit (INDEPENDENT_AMBULATORY_CARE_PROVIDER_SITE_OTHER): Payer: 59

## 2021-12-19 ENCOUNTER — Other Ambulatory Visit: Payer: Self-pay

## 2021-12-19 VITALS — BP 126/72 | HR 60 | Ht 67.0 in | Wt 265.2 lb

## 2021-12-19 DIAGNOSIS — I350 Nonrheumatic aortic (valve) stenosis: Secondary | ICD-10-CM

## 2021-12-19 DIAGNOSIS — Z7901 Long term (current) use of anticoagulants: Secondary | ICD-10-CM

## 2021-12-19 DIAGNOSIS — I10 Essential (primary) hypertension: Secondary | ICD-10-CM | POA: Diagnosis not present

## 2021-12-19 DIAGNOSIS — Z7189 Other specified counseling: Secondary | ICD-10-CM

## 2021-12-19 DIAGNOSIS — Z952 Presence of prosthetic heart valve: Secondary | ICD-10-CM | POA: Diagnosis not present

## 2021-12-19 LAB — ECHOCARDIOGRAM COMPLETE
AR max vel: 1.54 cm2
AV Area VTI: 1.61 cm2
AV Area mean vel: 1.52 cm2
AV Mean grad: 12 mmHg
AV Peak grad: 21.9 mmHg
Ao pk vel: 2.34 m/s
Area-P 1/2: 3.46 cm2
MV M vel: 2.61 m/s
MV Peak grad: 27.2 mmHg
S' Lateral: 4.23 cm

## 2021-12-19 NOTE — Patient Instructions (Addendum)
Medication Instructions:  ?Your physician recommends that you continue on your current medications as directed. Please refer to the Current Medication list given to you today.  ? ?Labwork: ?NONE ? ?Testing/Procedures: ?NONE ? ?Follow-Up: ?6 MONTHS  ? ?Any Other Special Instructions Will Be Listed Below (If Applicable). ? ?how to check blood pressure: ? -sit comfortably in a chair, feet uncrossed and flat on floor, for 5-10 minutes ? -arm ideally should rest at the level of the heart. However, arm should be relaxed and not tense (for example, do not hold the arm up unsupported) ? -avoid exercise, caffeine, and tobacco for at least 30 minutes prior to BP reading ? -don't take BP cuff reading over clothes (always place on skin directly) ? -I prefer to know how well the medication is working, so I would like you to take your readings 1-2 hours after taking your blood pressure medication if possible  ? ?I like arm cuffs better than wrist cuffs. Omron is a good brand. ?

## 2021-12-20 ENCOUNTER — Encounter (HOSPITAL_BASED_OUTPATIENT_CLINIC_OR_DEPARTMENT_OTHER): Payer: Self-pay | Admitting: Family Medicine

## 2021-12-21 ENCOUNTER — Encounter (HOSPITAL_BASED_OUTPATIENT_CLINIC_OR_DEPARTMENT_OTHER): Payer: Self-pay

## 2021-12-21 MED ORDER — METOPROLOL TARTRATE 25 MG PO TABS: 12.5000 mg | ORAL_TABLET | Freq: Two times a day (BID) | ORAL | 3 refills | Status: DC

## 2021-12-21 NOTE — Telephone Encounter (Signed)
-  Returned call to pt regarding Mary Chambers. ?-Pt state on Friday while taking a shower, she felt like her blood pressure dropped. She state she was very shaky, had no energy, could barely move, and felt like her heart was racing. ?-Today, pt report a similar episode that started 10-15 minutes ago while using the bathroom. ?-Pt currently reports she feels like her heart is racing and she is very shaky. -Pt denies any other symptoms such as lightheadedness, dizziness, CP, etc. ?-Pt state she has eaten today and had drunk plenty fluids.  ?-Pt unable to provide BP or HR readings.  ? ?Will forward to provider for recommendations. ? ?

## 2021-12-22 ENCOUNTER — Encounter (HOSPITAL_BASED_OUTPATIENT_CLINIC_OR_DEPARTMENT_OTHER): Payer: Self-pay | Admitting: Family Medicine

## 2021-12-22 ENCOUNTER — Other Ambulatory Visit: Payer: Self-pay

## 2021-12-22 ENCOUNTER — Ambulatory Visit (INDEPENDENT_AMBULATORY_CARE_PROVIDER_SITE_OTHER): Payer: 59 | Admitting: Family Medicine

## 2021-12-22 VITALS — BP 127/79 | HR 71 | Ht 67.0 in | Wt 265.0 lb

## 2021-12-22 DIAGNOSIS — F419 Anxiety disorder, unspecified: Secondary | ICD-10-CM

## 2021-12-22 DIAGNOSIS — F32A Depression, unspecified: Secondary | ICD-10-CM | POA: Diagnosis not present

## 2021-12-22 DIAGNOSIS — S3210XD Unspecified fracture of sacrum, subsequent encounter for fracture with routine healing: Secondary | ICD-10-CM

## 2021-12-22 DIAGNOSIS — M546 Pain in thoracic spine: Secondary | ICD-10-CM

## 2021-12-22 DIAGNOSIS — M4120 Other idiopathic scoliosis, site unspecified: Secondary | ICD-10-CM | POA: Insufficient documentation

## 2021-12-22 DIAGNOSIS — S32009K Unspecified fracture of unspecified lumbar vertebra, subsequent encounter for fracture with nonunion: Secondary | ICD-10-CM | POA: Insufficient documentation

## 2021-12-22 HISTORY — DX: Pain in thoracic spine: M54.6

## 2021-12-22 HISTORY — DX: Unspecified fracture of sacrum, subsequent encounter for fracture with routine healing: S32.10XD

## 2021-12-22 NOTE — Patient Instructions (Signed)

## 2021-12-22 NOTE — Progress Notes (Signed)
? ?  Virtual Visit via Telephone ?  ?I connected with  Mary Chambers  on 12/22/21 by telephone/telehealth and verified that I am speaking with the correct person using two identifiers. ?  ?I discussed the limitations, risks, security and privacy concerns of performing an evaluation and management service by telephone, including the higher likelihood of inaccurate diagnosis and treatment, and the availability of in person appointments.  We also discussed the likely need of an additional face to face encounter for complete and high quality delivery of care.  I also discussed with the patient that there may be a patient responsible charge related to this service. The patient expressed understanding and wishes to proceed. ? ?Provider location is in medical facility. ?Patient location is at their home, different from provider location. ?People involved in care of the patient during this telehealth encounter were myself, my nurse/medical assistant, and my front office/scheduling team member. ? ?Review of Systems: No fevers, chills, night sweats, weight loss, chest pain, or shortness of breath.  ? ?Objective Findings:   ? ?General: Speaking full sentences, no audible heavy breathing.  Sounds alert and appropriately interactive.   ? ?Independent interpretation of tests performed by another provider:  ? ?None. ? ?Brief History, Exam, Impression, and Recommendations:   ? ?Anxiety and depression ?Patient reports that she has been doing well with escitalopram 5 mg once daily.  She indicates that it has made her feel somewhat drowsy and she is thus taking this at night.  She denies any GI symptoms, no nausea or upset stomach.  Generally feels as though she has been tolerating medication well.  She denies any suicidal or homicidal ideation.  Feels that anxiety and depressive symptoms as well as sleep related issues have improved since starting the medication 2 weeks ago. ?Discussed options with patient, at this time we will  continue with 5 mg dosing and plan for follow-up in about 1 month to monitor progress.  At that time, reassess symptoms and discuss option of dose titration to 10 mg dose ? ?I discussed the above assessment and treatment plan with the patient. The patient was provided an opportunity to ask questions and all were answered. The patient agreed with the plan and demonstrated an understanding of the instructions. ?  ?The patient was advised to call back or seek an in-person evaluation if the symptoms worsen or if the condition fails to improve as anticipated. ?  ?I provided 10 minutes of face to face and non-face-to-face time during this encounter date, time was needed to gather information, review chart, records, communicate/coordinate with staff remotely, as well as complete documentation. ? ? ?___________________________________________ ?Robie Mcniel de [place], MD, ABFM, CAQSM ?Primary Care and Sports Medicine ?[facility] ?

## 2021-12-22 NOTE — Telephone Encounter (Signed)
Called pt and switch appt to virtual ?

## 2021-12-22 NOTE — Assessment & Plan Note (Signed)
Patient reports that she has been doing well with escitalopram 5 mg once daily.  She indicates that it has made her feel somewhat drowsy and she is thus taking this at night.  She denies any GI symptoms, no nausea or upset stomach.  Generally feels as though she has been tolerating medication well.  She denies any suicidal or homicidal ideation.  Feels that anxiety and depressive symptoms as well as sleep related issues have improved since starting the medication 2 weeks ago. ?Discussed options with patient, at this time we will continue with 5 mg dosing and plan for follow-up in about 1 month to monitor progress.  At that time, reassess symptoms and discuss option of dose titration to 10 mg dose ?

## 2021-12-27 ENCOUNTER — Ambulatory Visit (INDEPENDENT_AMBULATORY_CARE_PROVIDER_SITE_OTHER): Payer: 59

## 2021-12-27 ENCOUNTER — Other Ambulatory Visit: Payer: Self-pay

## 2021-12-27 DIAGNOSIS — Z7901 Long term (current) use of anticoagulants: Secondary | ICD-10-CM

## 2021-12-27 DIAGNOSIS — I351 Nonrheumatic aortic (valve) insufficiency: Secondary | ICD-10-CM

## 2021-12-27 DIAGNOSIS — Z952 Presence of prosthetic heart valve: Secondary | ICD-10-CM | POA: Diagnosis not present

## 2021-12-27 LAB — POCT INR: INR: 2.2 (ref 2.0–3.0)

## 2021-12-27 NOTE — Patient Instructions (Signed)
Description   ?Continue on same dosage of Warfarin 1 tablet ('5mg'$ ) daily. Keep the number of servings of green leafy vegetables consistent in your diet. Recheck in 3 weeks.  Coumadin Clinic [phone] call with medication changes and or bleeding. ?  ?  ?

## 2021-12-30 ENCOUNTER — Other Ambulatory Visit (HOSPITAL_COMMUNITY): Payer: Self-pay | Admitting: Neurosurgery

## 2021-12-30 ENCOUNTER — Other Ambulatory Visit (HOSPITAL_BASED_OUTPATIENT_CLINIC_OR_DEPARTMENT_OTHER): Payer: Self-pay | Admitting: Family Medicine

## 2021-12-30 ENCOUNTER — Other Ambulatory Visit: Payer: Self-pay | Admitting: Neurosurgery

## 2021-12-30 DIAGNOSIS — F32A Depression, unspecified: Secondary | ICD-10-CM

## 2021-12-30 DIAGNOSIS — M545 Low back pain, unspecified: Secondary | ICD-10-CM

## 2022-01-13 ENCOUNTER — Encounter (HOSPITAL_COMMUNITY): Payer: Self-pay

## 2022-01-13 ENCOUNTER — Ambulatory Visit (HOSPITAL_COMMUNITY): Payer: Medicaid Other | Attending: Neurosurgery

## 2022-01-17 ENCOUNTER — Ambulatory Visit (INDEPENDENT_AMBULATORY_CARE_PROVIDER_SITE_OTHER): Payer: Medicaid Other | Admitting: *Deleted

## 2022-01-17 DIAGNOSIS — Z7901 Long term (current) use of anticoagulants: Secondary | ICD-10-CM

## 2022-01-17 DIAGNOSIS — I351 Nonrheumatic aortic (valve) insufficiency: Secondary | ICD-10-CM

## 2022-01-17 DIAGNOSIS — Z952 Presence of prosthetic heart valve: Secondary | ICD-10-CM

## 2022-01-17 LAB — POCT INR: INR: 1.8 — AB (ref 2.0–3.0)

## 2022-01-17 NOTE — Patient Instructions (Signed)
Description   ?Today take 1.5 tablets then continue taking Warfarin 1 tablet ('5mg'$ ) daily. Keep the number of servings of green leafy vegetables consistent in your diet. Recheck in 3 weeks.  Coumadin Clinic [phone] call with medication changes and or bleeding. ?  ?  ?

## 2022-01-20 ENCOUNTER — Ambulatory Visit (INDEPENDENT_AMBULATORY_CARE_PROVIDER_SITE_OTHER): Payer: Medicaid Other | Admitting: Family Medicine

## 2022-01-20 ENCOUNTER — Encounter (HOSPITAL_BASED_OUTPATIENT_CLINIC_OR_DEPARTMENT_OTHER): Payer: Self-pay | Admitting: Family Medicine

## 2022-01-20 DIAGNOSIS — Z0000 Encounter for general adult medical examination without abnormal findings: Secondary | ICD-10-CM

## 2022-01-20 DIAGNOSIS — F32A Depression, unspecified: Secondary | ICD-10-CM | POA: Diagnosis not present

## 2022-01-20 DIAGNOSIS — F419 Anxiety disorder, unspecified: Secondary | ICD-10-CM

## 2022-01-20 DIAGNOSIS — Z23 Encounter for immunization: Secondary | ICD-10-CM

## 2022-01-20 MED ORDER — SHINGRIX 50 MCG/0.5ML IM SUSR: 0.5000 mL | Freq: Once | INTRAMUSCULAR | 0 refills | Status: AC

## 2022-01-20 MED ORDER — ESCITALOPRAM OXALATE 10 MG PO TABS: 10.0000 mg | ORAL_TABLET | Freq: Every day | ORAL | 1 refills | Status: DC

## 2022-01-20 NOTE — Patient Instructions (Signed)
?  Medication Instructions:  ?Your physician recommends that you continue on your current medications as directed. Please refer to the Current Medication list given to you today. ?- ? ? ?Follow-Up: ?Your next appointment:   ?Your physician recommends that you schedule a follow-up appointment in: 2-3 month CPE labs one week before with Dr. Tennis Must [place] ? ?You will receive a text message or e-mail with a link to a survey about your care and experience with [place] today! We would greatly appreciate your feedback!  ? ?Thanks for letting us be apart of your health journey!!  ?Primary Care and Sports Medicine  ? ?Dr. Kyung Rudd de [place]  ? ?We encourage you to activate your patient portal called "MyChart".  Sign up information is provided on this After Visit Summary.  MyChart is used to connect with patients for Virtual Visits (Telemedicine).  Patients are able to view lab/test results, encounter notes, upcoming appointments, etc.  Non-urgent messages can be sent to your provider as well. To learn more about what you can do with MyChart, please visit --  [email or web address].   ? ?

## 2022-01-20 NOTE — Progress Notes (Signed)
? ? ?  Procedures performed today:   ? ?None. ? ?Independent interpretation of notes and tests performed by another provider:  ? ?None. ? ?Brief History, Exam, Impression, and Recommendations:   ? ?BP 127/79   Pulse 69   Ht '5\' 7"'$  (1.702 m)   Wt 255 lb (115.7 kg)   SpO2 97%   BMI 39.94 kg/m?  ? ?Anxiety and depression ?Patient reports that she has been tolerating medication well.  Denies any GI side effects.  She denies any suicidal or homicidal ideation.  Generally she does feel that symptoms have been improved since being on the medication, however still feels that there would be room for improvement/optimization. ?Did discuss options with patient, she would like to proceed with dose titration to 10 mg daily dose, feel that this is reasonable ?New prescription sent to pharmacy on file ?Plan for follow-up in about 2 to 3 months to monitor progress with new dose of medication, complete CPE ? ?Sent Rx to pharmacy for Shingrix ? ?Plan for follow-up in about 2-3 months for CPE, NV for labs a few days prior ? ? ?___________________________________________ ?Finneus Kaneshiro de [place], MD, ABFM, CAQSM ?Primary Care and Sports Medicine ?[place] ?

## 2022-01-20 NOTE — Assessment & Plan Note (Signed)
Patient reports that she has been tolerating medication well.  Denies any GI side effects.  She denies any suicidal or homicidal ideation.  Generally she does feel that symptoms have been improved since being on the medication, however still feels that there would be room for improvement/optimization. ?Did discuss options with patient, she would like to proceed with dose titration to 10 mg daily dose, feel that this is reasonable ?New prescription sent to pharmacy on file ?Plan for follow-up in about 2 to 3 months to monitor progress with new dose of medication, complete CPE ?

## 2022-01-24 ENCOUNTER — Encounter (HOSPITAL_BASED_OUTPATIENT_CLINIC_OR_DEPARTMENT_OTHER): Payer: Self-pay

## 2022-01-25 NOTE — Telephone Encounter (Signed)
Patient has taken '20mg'$  lasix for two days, swelling in ankle not yet resolved, ok to increase dose?  ?

## 2022-01-27 ENCOUNTER — Telehealth (HOSPITAL_COMMUNITY): Payer: Self-pay

## 2022-01-27 NOTE — Telephone Encounter (Signed)
**  UPDATED** ?  ?Pt insurance is active and benefits verified through Medicaid. Co-pay $0.00, DED $0.00/$0.00 met, out of pocket $0.00/$0.00 met, co-insurance 0%. No pre-authorization required. Passport, 01/27/22 @ 1:31PM, TSS#[id number] ?  ?

## 2022-01-27 NOTE — Telephone Encounter (Signed)
Called patient to see if she was interested in participating in the Cardiac Rehab Program. Patient stated yes. Patient will come in for orientation on 02/02/22 '@9AM'$  and will attend the 830AM exercise class. Went over insurance, patient verbalized understanding.  ?  ?Tourist information centre manager.  ?

## 2022-02-01 ENCOUNTER — Telehealth (HOSPITAL_COMMUNITY): Payer: Self-pay | Admitting: *Deleted

## 2022-02-01 NOTE — Telephone Encounter (Signed)
Spoke with Angela Nevin confirmed appointment. Completed health history.Barnet Pall, RN,BSN ?02/01/2022 4:44 PM  ?

## 2022-02-02 ENCOUNTER — Inpatient Hospital Stay (HOSPITAL_COMMUNITY): Admission: RE | Admit: 2022-02-02 | Payer: Medicaid Other | Source: Ambulatory Visit

## 2022-02-02 ENCOUNTER — Telehealth (HOSPITAL_COMMUNITY): Payer: Self-pay | Admitting: *Deleted

## 2022-02-02 NOTE — Telephone Encounter (Signed)
Mary Chambers did not arrive for 0900 appointment this morning. Will cancel orientation appointment. Left message for patient to call to reschedule.Barnet Pall, RN,BSN ?02/02/2022 9:32 AM  ?

## 2022-02-06 ENCOUNTER — Ambulatory Visit (HOSPITAL_COMMUNITY): Payer: Medicaid Other

## 2022-02-07 ENCOUNTER — Ambulatory Visit (INDEPENDENT_AMBULATORY_CARE_PROVIDER_SITE_OTHER): Payer: Medicaid Other

## 2022-02-07 DIAGNOSIS — I351 Nonrheumatic aortic (valve) insufficiency: Secondary | ICD-10-CM | POA: Diagnosis not present

## 2022-02-07 DIAGNOSIS — Z952 Presence of prosthetic heart valve: Secondary | ICD-10-CM

## 2022-02-07 DIAGNOSIS — Z7901 Long term (current) use of anticoagulants: Secondary | ICD-10-CM

## 2022-02-07 LAB — POCT INR: INR: 1.8 — AB (ref 2.0–3.0)

## 2022-02-07 NOTE — Patient Instructions (Signed)
Description   ?Today take 1.5 tablets then start taking Warfarin 1 tablet ('5mg'$ ) daily except 1.5 tablets (7.'5mg'$ ) on Mondays. Keep the number of servings of green leafy vegetables consistent in your diet. Recheck in 3 weeks.  Coumadin Clinic [phone] call with medication changes and or bleeding. ?  ?  ?

## 2022-02-08 ENCOUNTER — Ambulatory Visit (HOSPITAL_COMMUNITY): Payer: Medicaid Other

## 2022-02-10 ENCOUNTER — Ambulatory Visit (HOSPITAL_COMMUNITY): Payer: Medicaid Other

## 2022-02-13 ENCOUNTER — Ambulatory Visit (HOSPITAL_COMMUNITY): Payer: Medicaid Other

## 2022-02-15 ENCOUNTER — Ambulatory Visit (HOSPITAL_COMMUNITY): Payer: Medicaid Other

## 2022-02-17 ENCOUNTER — Ambulatory Visit (HOSPITAL_COMMUNITY): Payer: Medicaid Other

## 2022-02-20 ENCOUNTER — Ambulatory Visit (HOSPITAL_COMMUNITY): Payer: Medicaid Other

## 2022-02-22 ENCOUNTER — Ambulatory Visit (HOSPITAL_COMMUNITY): Payer: Medicaid Other

## 2022-02-22 ENCOUNTER — Encounter (HOSPITAL_BASED_OUTPATIENT_CLINIC_OR_DEPARTMENT_OTHER): Payer: Self-pay

## 2022-02-24 ENCOUNTER — Other Ambulatory Visit (HOSPITAL_BASED_OUTPATIENT_CLINIC_OR_DEPARTMENT_OTHER): Payer: Self-pay | Admitting: Family Medicine

## 2022-02-24 ENCOUNTER — Ambulatory Visit (HOSPITAL_COMMUNITY): Payer: Medicaid Other

## 2022-02-24 DIAGNOSIS — R609 Edema, unspecified: Secondary | ICD-10-CM

## 2022-02-28 ENCOUNTER — Ambulatory Visit (INDEPENDENT_AMBULATORY_CARE_PROVIDER_SITE_OTHER): Payer: Medicaid Other | Admitting: *Deleted

## 2022-02-28 DIAGNOSIS — Z7901 Long term (current) use of anticoagulants: Secondary | ICD-10-CM

## 2022-02-28 DIAGNOSIS — I351 Nonrheumatic aortic (valve) insufficiency: Secondary | ICD-10-CM

## 2022-02-28 DIAGNOSIS — Z952 Presence of prosthetic heart valve: Secondary | ICD-10-CM | POA: Diagnosis not present

## 2022-02-28 DIAGNOSIS — Z5181 Encounter for therapeutic drug level monitoring: Secondary | ICD-10-CM

## 2022-02-28 LAB — POCT INR: INR: 2.5 (ref 2.0–3.0)

## 2022-02-28 NOTE — Patient Instructions (Signed)
Description   Continue taking Warfarin 1 tablet ('5mg'$ ) daily except 1.5 tablets (7.'5mg'$ ) on Mondays. Keep the number of servings of green leafy vegetables consistent in your diet. Recheck in 4 weeks.  Coumadin Clinic [phone] call with medication changes and or bleeding.

## 2022-03-01 ENCOUNTER — Ambulatory Visit (HOSPITAL_COMMUNITY): Payer: Medicaid Other

## 2022-03-02 NOTE — Telephone Encounter (Signed)
Pt. Following up with [facility]

## 2022-03-03 ENCOUNTER — Ambulatory Visit (HOSPITAL_COMMUNITY): Payer: Medicaid Other

## 2022-03-06 ENCOUNTER — Ambulatory Visit (HOSPITAL_COMMUNITY): Payer: Medicaid Other

## 2022-03-08 ENCOUNTER — Ambulatory Visit (HOSPITAL_COMMUNITY): Payer: Medicaid Other

## 2022-03-09 ENCOUNTER — Other Ambulatory Visit: Payer: Self-pay | Admitting: Physician Assistant

## 2022-03-10 ENCOUNTER — Other Ambulatory Visit: Payer: Self-pay

## 2022-03-10 ENCOUNTER — Ambulatory Visit (HOSPITAL_COMMUNITY): Payer: Medicaid Other

## 2022-03-10 DIAGNOSIS — Q231 Congenital insufficiency of aortic valve: Secondary | ICD-10-CM

## 2022-03-10 MED ORDER — WARFARIN SODIUM 5 MG PO TABS: ORAL_TABLET | ORAL | 3 refills | Status: DC

## 2022-03-10 NOTE — Telephone Encounter (Signed)
Prescription refill request received for warfarin [facility]: 12/19/21 [facility] Gave) Next INR check: 03/28/22 Warfarin tablet strength: '5mg'$   Appropriate dose and refill sent to requested pharmacy.

## 2022-03-10 NOTE — Addendum Note (Signed)
Addended by: Leonidas Romberg on: 03/10/2022 11:51 AM   Modules accepted: Orders

## 2022-03-13 ENCOUNTER — Ambulatory Visit (HOSPITAL_COMMUNITY): Payer: Medicaid Other

## 2022-03-15 ENCOUNTER — Ambulatory Visit (HOSPITAL_COMMUNITY): Payer: Medicaid Other

## 2022-03-17 ENCOUNTER — Ambulatory Visit (HOSPITAL_COMMUNITY): Payer: Medicaid Other

## 2022-03-20 ENCOUNTER — Ambulatory Visit (HOSPITAL_COMMUNITY): Payer: Medicaid Other

## 2022-03-22 ENCOUNTER — Ambulatory Visit (HOSPITAL_COMMUNITY): Payer: Medicaid Other

## 2022-03-23 ENCOUNTER — Telehealth (HOSPITAL_COMMUNITY): Payer: Self-pay | Admitting: *Deleted

## 2022-03-23 ENCOUNTER — Inpatient Hospital Stay (HOSPITAL_COMMUNITY): Admission: RE | Admit: 2022-03-23 | Payer: Medicaid Other | Source: Ambulatory Visit

## 2022-03-23 NOTE — Telephone Encounter (Signed)
Left message to call cardiac rehab regarding cardiac rehab appointment today.Barnet Pall, RN,BSN 03/23/2022 8:42 AM

## 2022-03-24 ENCOUNTER — Ambulatory Visit (HOSPITAL_COMMUNITY): Payer: Medicaid Other

## 2022-03-27 ENCOUNTER — Ambulatory Visit (INDEPENDENT_AMBULATORY_CARE_PROVIDER_SITE_OTHER): Payer: Medicaid Other

## 2022-03-27 ENCOUNTER — Ambulatory Visit (HOSPITAL_COMMUNITY): Payer: Medicaid Other

## 2022-03-27 DIAGNOSIS — Z952 Presence of prosthetic heart valve: Secondary | ICD-10-CM | POA: Diagnosis not present

## 2022-03-27 DIAGNOSIS — I351 Nonrheumatic aortic (valve) insufficiency: Secondary | ICD-10-CM | POA: Diagnosis not present

## 2022-03-27 DIAGNOSIS — Z7901 Long term (current) use of anticoagulants: Secondary | ICD-10-CM

## 2022-03-27 LAB — POCT INR: INR: 2.1 (ref 2.0–3.0)

## 2022-03-29 ENCOUNTER — Ambulatory Visit (HOSPITAL_COMMUNITY): Payer: Medicaid Other

## 2022-03-31 ENCOUNTER — Ambulatory Visit (HOSPITAL_COMMUNITY): Payer: Medicaid Other

## 2022-04-03 ENCOUNTER — Ambulatory Visit (HOSPITAL_COMMUNITY): Payer: Medicaid Other

## 2022-04-05 ENCOUNTER — Ambulatory Visit (HOSPITAL_COMMUNITY): Payer: Medicaid Other

## 2022-04-07 ENCOUNTER — Ambulatory Visit (HOSPITAL_COMMUNITY): Payer: Medicaid Other

## 2022-04-07 ENCOUNTER — Encounter: Payer: Medicaid Other | Admitting: Nurse Practitioner

## 2022-04-07 NOTE — Progress Notes (Signed)
Because of your history of sepsis we would like you to have more of a workup. We cannot order any lab testing through this service currently and you should have a urine culture performed at minimum. I feel your condition warrants further evaluation and I recommend that you be seen in a face to face visit.   NOTE: There will be NO CHARGE for this eVisit   If you are having a true medical emergency please call 911.      For an urgent face to face visit, [facility] has seven urgent care centers for your convenience:   If it is after hours you may also visit an Emergency [facility] [place] Get Driving Directions [phone] [address] [place], [place] [address]    [facility]) Get Driving Directions [phone] [address] [place], [place] [address]  [facility]) Get Driving Directions [phone] [address] [place],  [place]  [address]  [facility] - [facility] [place] Get Driving Directions  [phone] [address] [place],  [place] [address]   [facility] Get Driving Directions [phone] [address], [place], [place] [address]   [facility] Get Driving Directions  [phone] [address], [place] [address]   [facility] [place] Get Driving Directions [phone] [address] [place], [place] [address]  Your MyChart E-visit questionnaire answers were reviewed by a board certified advanced clinical practitioner to complete your personal care plan based on your specific symptoms.  Thank you for using e-Visits.

## 2022-04-08 ENCOUNTER — Ambulatory Visit: Payer: Medicaid Other

## 2022-04-10 ENCOUNTER — Ambulatory Visit (HOSPITAL_BASED_OUTPATIENT_CLINIC_OR_DEPARTMENT_OTHER): Payer: Medicaid Other

## 2022-04-10 ENCOUNTER — Ambulatory Visit (HOSPITAL_COMMUNITY): Payer: Medicaid Other

## 2022-04-12 ENCOUNTER — Ambulatory Visit (HOSPITAL_COMMUNITY): Payer: Medicaid Other

## 2022-04-14 ENCOUNTER — Ambulatory Visit (HOSPITAL_COMMUNITY): Payer: Medicaid Other

## 2022-04-17 ENCOUNTER — Ambulatory Visit (HOSPITAL_COMMUNITY): Payer: Medicaid Other

## 2022-04-19 ENCOUNTER — Ambulatory Visit (HOSPITAL_COMMUNITY): Payer: Medicaid Other

## 2022-04-21 ENCOUNTER — Ambulatory Visit (HOSPITAL_COMMUNITY): Payer: Medicaid Other

## 2022-04-21 ENCOUNTER — Encounter (HOSPITAL_BASED_OUTPATIENT_CLINIC_OR_DEPARTMENT_OTHER): Payer: Medicaid Other | Admitting: Family Medicine

## 2022-04-22 ENCOUNTER — Telehealth: Payer: Medicaid Other | Admitting: Nurse Practitioner

## 2022-04-22 DIAGNOSIS — J329 Chronic sinusitis, unspecified: Secondary | ICD-10-CM | POA: Diagnosis not present

## 2022-04-22 DIAGNOSIS — B9789 Other viral agents as the cause of diseases classified elsewhere: Secondary | ICD-10-CM | POA: Diagnosis not present

## 2022-04-22 MED ORDER — FLUTICASONE PROPIONATE 50 MCG/ACT NA SUSP: 2.0000 | Freq: Every day | NASAL | 6 refills | Status: DC

## 2022-04-22 NOTE — Progress Notes (Signed)
E-Visit for Sinus Problems ? ?We are sorry that you are not feeling well.  Here is how we plan to help! ? ?Based on what you have shared with me it looks like you have sinusitis.  Sinusitis is inflammation and infection in the sinus cavities of the head.  Based on your presentation I believe you most likely have Acute Viral Sinusitis.This is an infection most likely caused by a virus. There is not specific treatment for viral sinusitis other than to help you with the symptoms until the infection runs its course.  You may use an oral decongestant such as Mucinex D or if you have glaucoma or high blood pressure use plain Mucinex. Saline nasal spray help and can safely be used as often as needed for congestion, I have prescribed: Fluticasone nasal spray two sprays in each nostril once a day ? ?Some authorities believe that zinc sprays or the use of Echinacea may shorten the course of your symptoms. ? ?Sinus infections are not as easily transmitted as other respiratory infection, however we still recommend that you avoid close contact with loved ones, especially the very young and elderly.  Remember to wash your hands thoroughly throughout the day as this is the number one way to prevent the spread of infection! ? ?Home Care: ?Only take medications as instructed by your medical team. ?Do not take these medications with alcohol. ?A steam or ultrasonic humidifier can help congestion.  You can place a towel over your head and breathe in the steam from hot water coming from a faucet. ?Avoid close contacts especially the very young and the elderly. ?Cover your mouth when you cough or sneeze. ?Always remember to wash your hands. ? ?Get Help Right Away If: ?You develop worsening fever or sinus pain. ?You develop a severe head ache or visual changes. ?Your symptoms persist after you have completed your treatment plan. ? ?Make sure you ?Understand these instructions. ?Will watch your condition. ?Will get help right away if you  are not doing well or get worse. ? ? ?Thank you for choosing an e-visit. ? ?Your e-visit answers were reviewed by a board certified advanced clinical practitioner to complete your personal care plan. Depending upon the condition, your plan could have included both over the counter or prescription medications. ? ?Please review your pharmacy choice. Make sure the pharmacy is open so you can pick up prescription now. If there is a problem, you may contact your provider through MyChart messaging and have the prescription routed to another pharmacy.  Your safety is important to us. If you have drug allergies check your prescription carefully.  ? ?For the next 24 hours you can use MyChart to ask questions about today's visit, request a non-urgent call back, or ask for a work or school excuse. ?You will get an email in the next two days asking about your experience. I hope that your e-visit has been valuable and will speed your recovery. ? ?5-10 minutes spent reviewing and documenting in chart. ? ?

## 2022-04-23 ENCOUNTER — Encounter (HOSPITAL_BASED_OUTPATIENT_CLINIC_OR_DEPARTMENT_OTHER): Payer: Self-pay

## 2022-04-24 ENCOUNTER — Ambulatory Visit (HOSPITAL_COMMUNITY): Payer: Medicaid Other

## 2022-04-25 ENCOUNTER — Telehealth: Payer: Self-pay

## 2022-04-25 ENCOUNTER — Telehealth: Payer: Medicaid Other | Admitting: Physician Assistant

## 2022-04-25 DIAGNOSIS — B9689 Other specified bacterial agents as the cause of diseases classified elsewhere: Secondary | ICD-10-CM

## 2022-04-25 MED ORDER — AMOXICILLIN-POT CLAVULANATE 875-125 MG PO TABS: 1.0000 | ORAL_TABLET | Freq: Two times a day (BID) | ORAL | 0 refills | Status: DC

## 2022-04-25 NOTE — Telephone Encounter (Signed)
I spoke to patient who is starting Amoxicillin bid x 7 days.  I encouraged her to eat more greens and we will see her 7/31 to check INR.  She verbalized understanding

## 2022-04-25 NOTE — Progress Notes (Signed)
E-Visit for Sinus Problems  We are sorry that you are not feeling well.  Here is how we plan to help!  Based on what you have shared with me it looks like you have sinusitis.  Sinusitis is inflammation and infection in the sinus cavities of the head.  Based on your presentation and the fact that you had a visit several days ago for similar symptoms that had already been present for some time, I believe you most likely have Acute Bacterial Sinusitis.  This is an infection caused by bacteria and is treated with antibiotics. I have prescribed Augmentin '875mg'$ /'125mg'$  one tablet twice daily with food, for 7 days. You may use an oral decongestant such as Mucinex D or if you have glaucoma or high blood pressure use plain Mucinex. Saline nasal spray help and can safely be used as often as needed for congestion.  If you develop worsening sinus pain, fever or notice severe headache and vision changes, or if symptoms are not better after completion of antibiotic, please schedule an appointment with a health care provider.    Sinus infections are not as easily transmitted as other respiratory infection, however we still recommend that you avoid close contact with loved ones, especially the very young and elderly.  Remember to wash your hands thoroughly throughout the day as this is the number one way to prevent the spread of infection!  Home Care: Only take medications as instructed by your medical team. Complete the entire course of an antibiotic. Do not take these medications with alcohol. A steam or ultrasonic humidifier can help congestion.  You can place a towel over your head and breathe in the steam from hot water coming from a faucet. Avoid close contacts especially the very young and the elderly. Cover your mouth when you cough or sneeze. Always remember to wash your hands.  Get Help Right Away If: You develop worsening fever or sinus pain. You develop a severe head ache or visual changes. Your  symptoms persist after you have completed your treatment plan.  Make sure you Understand these instructions. Will watch your condition. Will get help right away if you are not doing well or get worse.  Thank you for choosing an e-visit.  Your e-visit answers were reviewed by a board certified advanced clinical practitioner to complete your personal care plan. Depending upon the condition, your plan could have included both over the counter or prescription medications.  Please review your pharmacy choice. Make sure the pharmacy is open so you can pick up prescription now. If there is a problem, you may contact your provider through [employer] and have the prescription routed to another pharmacy.  Your safety is important to [place]. If you have drug allergies check your prescription carefully.   For the next 24 hours you can use MyChart to ask questions about today's visit, request a non-urgent call back, or ask for a work or school excuse. You will get an email in the next two days asking about your experience. I hope that your e-visit has been valuable and will speed your recovery.

## 2022-04-25 NOTE — Progress Notes (Signed)
I have spent 5 minutes in review of e-visit questionnaire, review and updating patient chart, medical decision making and response to patient.   Michaline Kindig Cody Melburn Treiber, PA-C    

## 2022-04-26 ENCOUNTER — Ambulatory Visit (HOSPITAL_COMMUNITY): Payer: Medicaid Other

## 2022-04-28 ENCOUNTER — Ambulatory Visit (HOSPITAL_COMMUNITY): Payer: Medicaid Other

## 2022-05-01 ENCOUNTER — Ambulatory Visit (HOSPITAL_COMMUNITY): Payer: Medicaid Other

## 2022-05-01 ENCOUNTER — Ambulatory Visit (INDEPENDENT_AMBULATORY_CARE_PROVIDER_SITE_OTHER): Payer: Medicaid Other | Admitting: *Deleted

## 2022-05-01 DIAGNOSIS — I351 Nonrheumatic aortic (valve) insufficiency: Secondary | ICD-10-CM

## 2022-05-01 DIAGNOSIS — Z7901 Long term (current) use of anticoagulants: Secondary | ICD-10-CM

## 2022-05-01 DIAGNOSIS — Z952 Presence of prosthetic heart valve: Secondary | ICD-10-CM

## 2022-05-01 LAB — POCT INR: INR: 2.2 (ref 2.0–3.0)

## 2022-05-01 NOTE — Patient Instructions (Signed)
Description   Continue taking Warfarin 1 tablet ('5mg'$ ) daily except 1.5 tablets (7.'5mg'$ ) on Mondays. Keep the number of servings of green leafy vegetables consistent in your diet. Recheck in 6 weeks. [facility] [phone] call with medication changes and or bleeding.

## 2022-05-03 ENCOUNTER — Ambulatory Visit (HOSPITAL_COMMUNITY): Payer: Medicaid Other

## 2022-05-05 ENCOUNTER — Ambulatory Visit (HOSPITAL_COMMUNITY): Payer: Medicaid Other

## 2022-05-08 ENCOUNTER — Ambulatory Visit (HOSPITAL_COMMUNITY): Payer: Medicaid Other

## 2022-05-10 ENCOUNTER — Telehealth: Payer: Medicaid Other | Admitting: Physician Assistant

## 2022-05-10 ENCOUNTER — Ambulatory Visit (HOSPITAL_COMMUNITY): Payer: Medicaid Other

## 2022-05-10 DIAGNOSIS — B9689 Other specified bacterial agents as the cause of diseases classified elsewhere: Secondary | ICD-10-CM

## 2022-05-10 NOTE — Progress Notes (Signed)
Because you were recently treated for the same issue on 04/25/22 and have failed treatment, or having recurrent infection, I feel your condition warrants further evaluation and I recommend that you be seen in a face to face visit.   NOTE: There will be NO CHARGE for this eVisit   If you are having a true medical emergency please call 911.      For an urgent face to face visit, [place] has seven urgent care centers for your convenience:     [facility] [place] [facility] [place] Get Driving Directions [phone] [address] [place], [place] [address]    [facility]) Get Driving Directions [phone] [place], [place] [address]  [facility] [place] ([place]) Get Driving Directions [phone] [address] [place],  [place]  [address]  [facility] - at [address] [place] Get Driving Directions  [phone] [place],  [place] [address]   [facility] Get Driving Directions [phone] [address] [place], [place], [place] [address]   [facility] Get Driving Directions  [phone] [address] [place], [place] [address]   [facility] [place] Get Driving Directions [phone] [address], [place], [place] [address]  Your MyChart E-visit questionnaire answers were reviewed by a board certified advanced clinical practitioner to complete your personal care plan based on your specific symptoms.  Thank you for using e-Visits.   I provided 5 minutes of non face-to-face time during this encounter for chart review and documentation.

## 2022-05-12 ENCOUNTER — Ambulatory Visit (HOSPITAL_COMMUNITY): Payer: Medicaid Other

## 2022-05-15 ENCOUNTER — Ambulatory Visit (HOSPITAL_COMMUNITY): Payer: Medicaid Other

## 2022-05-17 ENCOUNTER — Ambulatory Visit (HOSPITAL_COMMUNITY): Payer: Medicaid Other

## 2022-05-17 ENCOUNTER — Telehealth: Payer: Medicaid Other | Admitting: Nurse Practitioner

## 2022-05-17 DIAGNOSIS — R399 Unspecified symptoms and signs involving the genitourinary system: Secondary | ICD-10-CM | POA: Diagnosis not present

## 2022-05-17 MED ORDER — CEPHALEXIN 500 MG PO CAPS: 500.0000 mg | ORAL_CAPSULE | Freq: Two times a day (BID) | ORAL | 0 refills | Status: DC

## 2022-05-17 NOTE — Progress Notes (Signed)

## 2022-05-19 ENCOUNTER — Ambulatory Visit (HOSPITAL_COMMUNITY): Payer: Medicaid Other

## 2022-06-09 ENCOUNTER — Encounter (HOSPITAL_BASED_OUTPATIENT_CLINIC_OR_DEPARTMENT_OTHER): Payer: Self-pay | Admitting: Family Medicine

## 2022-06-12 ENCOUNTER — Ambulatory Visit: Payer: Medicaid Other

## 2022-06-12 ENCOUNTER — Ambulatory Visit (HOSPITAL_BASED_OUTPATIENT_CLINIC_OR_DEPARTMENT_OTHER): Payer: Medicaid Other | Admitting: Family Medicine

## 2022-06-15 ENCOUNTER — Ambulatory Visit: Payer: Medicaid Other | Attending: Cardiology

## 2022-06-15 DIAGNOSIS — Z952 Presence of prosthetic heart valve: Secondary | ICD-10-CM

## 2022-06-15 DIAGNOSIS — Z7901 Long term (current) use of anticoagulants: Secondary | ICD-10-CM | POA: Diagnosis not present

## 2022-06-15 DIAGNOSIS — Z5181 Encounter for therapeutic drug level monitoring: Secondary | ICD-10-CM

## 2022-06-15 DIAGNOSIS — I351 Nonrheumatic aortic (valve) insufficiency: Secondary | ICD-10-CM | POA: Diagnosis not present

## 2022-06-15 LAB — POCT INR: INR: 2.5 (ref 2.0–3.0)

## 2022-06-15 NOTE — Patient Instructions (Signed)
Continue taking Warfarin 1 tablet ('5mg'$ ) daily except 1.5 tablets (7.'5mg'$ ) on Mondays. Keep the number of servings of green leafy vegetables consistent in your diet. Recheck in 6 weeks. [facility] [phone] call with medication changes and or bleeding.

## 2022-06-22 ENCOUNTER — Encounter (HOSPITAL_BASED_OUTPATIENT_CLINIC_OR_DEPARTMENT_OTHER): Payer: Self-pay | Admitting: Cardiology

## 2022-06-22 ENCOUNTER — Ambulatory Visit (INDEPENDENT_AMBULATORY_CARE_PROVIDER_SITE_OTHER): Payer: Medicaid Other | Admitting: Cardiology

## 2022-06-22 VITALS — BP 118/74 | HR 67 | Ht 67.0 in | Wt 271.5 lb

## 2022-06-22 DIAGNOSIS — Q231 Congenital insufficiency of aortic valve: Secondary | ICD-10-CM | POA: Diagnosis not present

## 2022-06-22 DIAGNOSIS — Z8679 Personal history of other diseases of the circulatory system: Secondary | ICD-10-CM | POA: Diagnosis not present

## 2022-06-22 DIAGNOSIS — I351 Nonrheumatic aortic (valve) insufficiency: Secondary | ICD-10-CM | POA: Diagnosis not present

## 2022-06-22 DIAGNOSIS — I10 Essential (primary) hypertension: Secondary | ICD-10-CM

## 2022-06-22 DIAGNOSIS — Z952 Presence of prosthetic heart valve: Secondary | ICD-10-CM | POA: Diagnosis not present

## 2022-06-22 NOTE — Progress Notes (Signed)
**Note Mary-Identified via Obfuscation** Cardiology Office Note:    Date:  06/22/2022   ID:  Mary Chambers, DOB 1970/10/07, MRN [id number]  PCP:  Mary [place]am, Raymond J, MD  Cardiologist:  [name], MD  Referring MD: Mary [place], Blondell Reveal, MD   CC: follow up   History of Present Illness:    Mary Chambers is a 51 y.o. female with a hx of aortic valve endocarditis s/p AVR 2023, hypertension, obesity, arthritis, and depression, who is seen for follow-up.    She presented to the ED 07/14/2021 with acute chest pain, evaluation unremarkable at that time. I met her during her hospitalization 06/2021 for paraspinal abscesses and endocarditis, with incidental Covid 19 infection at the time.  She underwent right/left heart catheterization 10/07/2021, and aortic valve replacement 11/07/2021.  At her last appointment she was recovering from her aortic valve replacement and waiting to begin rehab. She also reported an episode of severe fatigue and tremulousness while in the shower. She thought this was due to low blood pressure.  She had been started on Lexapro for insomnia. This was helping her significantly.  On 12/21/2021 she messaged the office reporting a recurrent episode of low blood pressure with tremors. Her metoprolol was reduced to 1/2 tablet BID.  Today: She states she is feeling well overall. She denies any recent bleeding issues.  She continues to have persistent swelling in her left ankle and foot. She notes that she remains compliant with Lasix but does not notice much improvement in her edema.  Occasionally she is a little short of breath with over-exertion. Lately her most strenuous activities include spending time with her grandchildren all summer.  She denies any palpitations, or chest pain. No lightheadedness, headaches, syncope, orthopnea, or PND. No fevers or chills.    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    Arthritis    Depression    Hypertension    Kidney stone    Obesity    Sleep apnea 06/25/2018    Past Surgical History:  Procedure Laterality Date   ABDOMINAL HYSTERECTOMY     AORTIC VALVE REPLACEMENT N/A 11/07/2021   Procedure: AORTIC VALVE REPLACEMENT USING A 23MM On-X AORTIC VALVE.;  Surgeon: Mary Matte, MD;  Location: [facility];  Service: Open Heart Surgery;  Laterality: N/A;   BACK SURGERY     BUBBLE STUDY  08/31/2021   Procedure: BUBBLE STUDY;  Surgeon: Mary Bergeron, MD;  Location: [place];  Service: Cardiovascular;;   CARPAL TUNNEL RELEASE     CHOLECYSTECTOMY     IR FLUORO GUIDED NEEDLE PLC ASPIRATION/INJECTION LOC  06/23/2021   LITHOTRIPSY     neck fusion     RIGHT/LEFT HEART CATH AND CORONARY ANGIOGRAPHY N/A 10/07/2021   Procedure: RIGHT/LEFT HEART CATH AND CORONARY ANGIOGRAPHY;  Surgeon: Mary Booze, MD;  Location: [place];  Service: Cardiovascular;  Laterality: N/A;   TEE WITHOUT CARDIOVERSION N/A 06/28/2021   Procedure: TRANSESOPHAGEAL ECHOCARDIOGRAM (TEE);  Surgeon: Mary Perla, MD;  Location: [facility] ENDOSCOPY;  Service: Cardiovascular;  Laterality: N/A;   TEE WITHOUT CARDIOVERSION N/A 08/31/2021   Procedure: TRANSESOPHAGEAL ECHOCARDIOGRAM (TEE);  Surgeon: Mary Bergeron, MD;  Location: [place];  Service: Cardiovascular;  Laterality: N/A;   TEE WITHOUT CARDIOVERSION N/A 11/07/2021   Procedure: TRANSESOPHAGEAL ECHOCARDIOGRAM (TEE);  Surgeon: Mary Matte, MD;  Location: [place];  Service: Open Heart Surgery;  Laterality: N/A;  TUBAL LIGATION      Current Medications: Current Outpatient Medications on File Prior to Visit  Medication Sig   amoxicillin-clavulanate (AUGMENTIN) 875-125 MG tablet Take 1 tablet by mouth 2 (two) times daily.   Ascorbic Acid (VITAMIN C) 500 MG CAPS Take 500 mg by mouth in the morning.   aspirin EC 81 MG EC tablet Take 1 tablet (81 mg total) by mouth daily. Swallow whole.   atorvastatin  (LIPITOR) 10 MG tablet Take 10 mg by mouth every evening.   cholecalciferol (VITAMIN D3) 25 MCG (1000 UT) tablet Take 1,000 Units by mouth in the morning.   escitalopram (LEXAPRO) 10 MG tablet Take 1 tablet (10 mg total) by mouth daily. (Patient taking differently: Take 10 mg by mouth every evening.)   fluticasone (FLONASE) 50 MCG/ACT nasal spray Place 2 sprays into both nostrils daily.   furosemide (LASIX) 20 MG tablet TAKE 1 TABLET BY MOUTH EVERY DAY   loperamide (IMODIUM) 2 MG capsule Take 1 capsule (2 mg total) by mouth as needed for diarrhea or loose stools.   metoprolol tartrate (LOPRESSOR) 25 MG tablet Take 0.5 tablets (12.5 mg total) by mouth 2 (two) times daily. (Patient taking differently: Take 25 mg by mouth in the morning.)   Multiple Vitamin (MULTIVITAMIN WITH MINERALS) TABS tablet Take 1 tablet by mouth in the morning.   naproxen (NAPROSYN) 250 MG tablet Take 250 mg by mouth 2 (two) times daily.   pantoprazole (PROTONIX) 40 MG tablet Take 1 tablet (40 mg total) by mouth daily.   Potassium (POTASSIMIN PO) Take 1 tablet by mouth daily as needed (with lasix).   tiZANidine (ZANAFLEX) 4 MG tablet Take 4 mg by mouth at bedtime.   warfarin (COUMADIN) 5 MG tablet Take 1 to 1 & 1/2 tablets daily or as directed by [facility]   No current facility-administered medications on file prior to visit.     Allergies:   Baclofen, Dilaudid [hydromorphone], Oxycodone hcl, Percocet [oxycodone-acetaminophen], Septra [sulfamethoxazole-trimethoprim], Vibramycin [doxycycline], and Neurontin [gabapentin]   Social History   Tobacco Use   Smoking status: Never   Smokeless tobacco: Never  Vaping Use   Vaping Use: Never used  Substance Use Topics   Alcohol use: No   Drug use: Never    Family History: family history includes Cancer in her father; Diabetes in her mother; Heart disease in her brother, father, and sister; Hypertension in her mother; Parkinson's disease in her father. There is no  history of Colon cancer, Rectal cancer, or Stomach cancer.  ROS:   Please see the history of present illness. (+) LLE edema to the ankle and foot (+) Exertional shortness of breath All other systems are reviewed and negative.   EKGs/Labs/Other Studies Reviewed:    The following studies were reviewed today:  Echo 12/19/2021:  1. Left ventricular ejection fraction, by estimation, is 50 to 55%. The  left ventricle has low normal function. The left ventricle has no regional  wall motion abnormalities. The left ventricular internal cavity size was  moderately dilated. There is mild   left ventricular hypertrophy. Left ventricular diastolic parameters were  normal.   2. Right ventricular systolic function is mildly reduced. The right  ventricular size is mildly enlarged. There is normal pulmonary artery  systolic pressure. The estimated right ventricular systolic pressure is  67.6 mmHg.   3. The mitral valve is normal in structure. Trivial mitral valve  regurgitation. No evidence of mitral stenosis.   4. A small pericardial effusion is present.  5. The inferior vena cava is dilated in size with <50% respiratory  variability, suggesting right atrial pressure of 15 mmHg.   6. There is a 23 mm On-X valve present in the aortic position      Aortic valve regurgitation is trivial. Vmax 2.3 m/s, MG 39mHg, EOA  1.6 cm^2, DI 0.46   Comparison(s): 11/07/2021 TEE-EF 60-65%.   Pre CABG Carotid Dopplers 11/04/2021: Summary:  Right Carotid: The extracranial vessels were near-normal with only minimal wall thickening or plaque.   Left Carotid: The extracranial vessels were near-normal with only minimal  wall thickening or plaque.   Vertebrals:  Bilateral vertebral arteries demonstrate antegrade flow.  Subclavians: Right subclavian artery flow was disturbed. Normal flow               hemodynamics were seen in the left subclavian artery.   Right Upper Extremity: Doppler waveforms decrease >50% with  right radial compression. Doppler waveforms remain within normal limits with right ulnar compression.   Left Upper Extremity: Doppler waveforms remain within normal limits with  left radial compression. Doppler waveforms remain within normal limits  with left ulnar compression.   Echo 11/04/2021:  1. Left ventricular ejection fraction, by estimation, is 55 to 60%. The  left ventricle has normal function. The left ventricle has no regional  wall motion abnormalities. The left ventricular internal cavity size was  mildly dilated. There is mild  concentric left ventricular hypertrophy. Left ventricular diastolic  parameters were normal.   2. Right ventricular systolic function is normal. The right ventricular  size is normal.   3. Left atrial size was mild to moderately dilated.   4. The mitral valve is grossly normal. Trivial mitral valve  regurgitation. No evidence of mitral stenosis.   5. Aortic valve not well visualized on current images but known to be  bicuspid from prior study. Severe eccentric AR, see image 41, jet fills  LVOT. The aortic valve was not well visualized. There is moderate  calcification of the aortic valve. Aortic  valve regurgitation is severe.   6. The inferior vena cava is normal in size with <50% respiratory  variability, suggesting right atrial pressure of 8 mmHg.   Comparison(s): No significant change from prior study.   Right/Left Heart Cath 10/07/2021:   The left ventricular systolic function is normal.   LV end diastolic pressure is mildly elevated.   There is mild aortic valve stenosis.  Mild aortic valve gradient noted on pullback   No angiographically apparent coronary artery disease.   Ao saturation 96%, PA saturation 71%, PA pressure 35/17, mean PA pressure 25 mmHg; mean pulmonary capillary wedge pressure 17 mmHg; cardiac output 7.02 L/min, cardiac index 3.1.   Wide pulse pressure noted during the case likely related to her aortic insufficiency.  No  coronary artery disease.  Continue with plans for management of aortic regurgitation.  Echo TEE 08/31/21 1. The aortic valve is bicuspid with fusion of the RCC and LCC. The  leaflets are asymmetrically thickened most notably at the [facility] tip  with concern for vegetation/infective endocarditis. There is flail of one  of the leaflets (suspect NCC) with  lack of central coaptation and resultant severe, highly eccentric aortic  regurgitation.   2. Left ventricular ejection fraction, by estimation, is 65 to 70%. The  left ventricle has normal function.   3. Right ventricular systolic function is normal. The right ventricular  size is normal.   4. No left atrial/left atrial appendage thrombus was detected.  5. The mitral valve is normal in structure. Trivial mitral valve  regurgitation.   6. Aortic dilatation noted. There is borderline dilatation of the aortic  root, measuring 36 mm. There is mild-to-moderate dilatation of the  ascending aorta, measuring 43 mm.   7. The interatrial septum is aneurysmal. There is a PFO demonstrated by  color doppler. Weakly positive agitated saline bubble study.  Comparison(s): Compared to prior TEE on 06/28/21, there continues to be  severe AR with concern for infective endocarditis.   CTA Chest 07/15/2021: COMPARISON:  Chest radiograph 07/14/2021 CT chest 04/22/2020   FINDINGS: Cardiovascular: Good opacification of the central and segmental pulmonary arteries. No focal filling defects. No evidence of significant pulmonary embolus. Normal heart size. No pericardial effusions. Ascending aortic aneurysm measuring 4.1 cm diameter. No aortic dissection. Great vessel origins are patent.   Mediastinum/Nodes: Thyroid gland is unremarkable. Esophagus is decompressed. No significant lymphadenopathy.   Lungs/Pleura: Mild dependent changes in the lung bases. No airspace disease or consolidation. No pleural effusions. No pneumothorax.   Upper Abdomen:  Surgical absence of the gallbladder. No acute abnormalities demonstrated in the upper abdomen.   Musculoskeletal: Postoperative changes in the cervical spine and lower thoracic spine. Degenerative changes. No destructive bone lesions.   Review of the MIP images confirms the above findings.   IMPRESSION: 1. No evidence of significant pulmonary embolus. 2. No evidence of active pulmonary disease. 3. 4.1 cm diameter ascending aortic aneurysm. Recommend annual imaging followup by CTA or MRA. This recommendation follows 2010 ACCF/AHA/AATS/ACR/ASA/SCA/SCAI/SIR/STS/SVM Guidelines for the Diagnosis and Management of Patients with Thoracic Aortic Disease. Circulation. 2010; 121: A834-H962. Aortic aneurysm NOS (ICD10-I71.9)  Echo TEE 06/28/2021:  1. Bicuspid aortic valve (left and right cusps appear to be fused);  probable vegetation noted; severe AI.   2. Left ventricular ejection fraction, by estimation, is 60 to 65%. The  left ventricle has normal function. The left ventricle has no regional  wall motion abnormalities.   3. Right ventricular systolic function is normal. The right ventricular  size is normal.   4. No left atrial/left atrial appendage thrombus was detected.   5. The mitral valve is normal in structure. No evidence of mitral valve  regurgitation.   6. The aortic valve is bicuspid. Aortic valve regurgitation is severe.  Echo 06/26/2021 1. Poor quality study bad acoustic windows limited.   2. Left ventricular ejection fraction, by estimation, is 55 to 60%. The  left ventricle has normal function. Left ventricular diastolic parameters  are indeterminate.   3. The mitral valve was not well visualized. No evidence of mitral valve  regurgitation.   4. AV not well seen Some calcification and ? mild to moderate AR Cannot  r/o vegetation Suggest TEE if clinically indicated . The aortic valve was  not well visualized.   5. The inferior vena cava is normal in size with greater than  50%  respiratory variability, suggesting right atrial pressure of 3 mmHg.   EKG:  EKG is personally reviewed.   06/22/2022:  NSR at 67 bpm 12/19/2021: EKG was not ordered. 09/20/2021: NSR at 90 bpm 07/26/2021: NSR at 98 bpm  Recent Labs: 07/14/2021: B Natriuretic Peptide 89.5 11/04/2021: ALT 9 11/08/2021: Magnesium 2.1 11/13/2021: BUN 19; Creatinine, Ser 0.99; Potassium 4.5; Sodium 142 11/29/2021: Hemoglobin 10.2; Platelets 497   Recent Lipid Panel    Component Value Date/Time   CHOL 91 11/10/2021 0216   TRIG 69 11/10/2021 0216   HDL 28 (L) 11/10/2021 0216   CHOLHDL 3.3 11/10/2021  0216   VLDL 14 11/10/2021 0216   LDLCALC 49 11/10/2021 0216    Physical Exam:    VS:  BP 118/74 (BP Location: Right Arm, Patient Position: Sitting, Cuff Size: Large)   Pulse 67   Ht 5' 7"  (1.702 m)   Wt 271 lb 8 oz (123.2 kg)   BMI 42.52 kg/m     Wt Readings from Last 3 Encounters:  06/22/22 271 lb 8 oz (123.2 kg)  01/20/22 255 lb (115.7 kg)  12/22/21 265 lb (120.2 kg)    GEN: Well nourished, well developed in no acute distress HEENT: Normal, moist mucous membranes NECK: No JVD CARDIAC: regular rhythm, normal S1 and S2 with mechanical click, no rubs or gallops. No murmur VASCULAR: Radial and DP pulses 2+ bilaterally. No carotid bruits RESPIRATORY:  Clear to auscultation without rales, wheezing or rhonchi  ABDOMEN: Soft, non-tender, non-distended MUSCULOSKELETAL:  Ambulates independently SKIN: Warm and dry, trivial LLE edema NEUROLOGIC:  Alert and oriented x 3. No focal neuro deficits noted. PSYCHIATRIC:  Normal affect    ASSESSMENT:    1. History of bacterial endocarditis   2. Severe aortic regurgitation   3. Bicuspid aortic valve   4. S/P AVR (aortic valve replacement)   5. Essential hypertension   6. Morbidly obese (HCC)    PLAN:    Aortic valve endocarditis Severe aortic regurgitation Bicuspid aortic valve S/P AVR with 33m On-X valve 11/07/21, Dr. LKipp Brood-on aspirin,  coumadin -has amoxicillin for dental prophylaxis -most recent echo reviewed today  Hypertension: -improved post surgery -only needs rare lasix  Morbid obesity: BMI 42 -discussed diet/exercise.  -[place] if [place]  Cardiac risk counseling and prevention recommendations: -recommend heart healthy/Mediterranean diet, with whole grains, fruits, vegetable, fish, lean meats, nuts, and olive oil. Limit salt. -recommend moderate walking, 3-5 times/week for 30-50 minutes each session. Aim for at least 150 minutes.week. Goal should be pace of 3 miles/hours, or walking 1.5 miles in 30 minutes -recommend avoidance of tobacco products. Avoid excess alcohol. -ASCVD risk score: The ASCVD Risk score (Arnett DK, et al., 2019) failed to calculate for the following reasons:   The valid total cholesterol range is 130 to 320 mg/dL    Plan for follow up: 6 months or sooner as needed  BBuford Dresser MD, PhD, [facility]    Medication Adjustments/Labs and Tests Ordered: Current medicines are reviewed at length with the patient today.  Concerns regarding medicines are outlined above.   Orders Placed This Encounter  Procedures   EKG 12-Lead   No orders of the defined types were placed in this encounter.  Patient Instructions  Medication Instructions:  Continue current medications  *If you need a refill on your cardiac medications before your next appointment, please call your pharmacy*   Lab Work: None Ordered   Testing/Procedures: None Ordered   Follow-Up: [facility] and your health needs are our priority.  As part of our continuing mission to provide you with exceptional heart care, we have created designated Provider Care Teams.  These Care Teams include your primary Cardiologist (physician) and Advanced Practice Providers (APPs -  Physician Assistants and Nurse Practitioners) who all work together to  provide you with the care you need, when you need it.  We recommend signing up for the patient portal called "MyChart".  Sign up information is provided on this After Visit Summary.  MyChart is used to connect with patients for Virtual Visits (Telemedicine).  Patients are able to view lab/test results, encounter notes, upcoming appointments, etc.  Non-urgent messages can be sent to your provider as well.   To learn more about what you can do with MyChart, go to [email or web address].    Your next appointment:   6 month(s)  The format for your next appointment:   In Person  Provider:   Dr Harrell Gave  Other Instructions           I,Mathew Stumpf,acting as a scribe for [name], MD.,have documented all relevant documentation on the behalf of [name], MD,as directed by  [name], MD while in the presence of [name], MD.  I, [name], MD, have reviewed all documentation for this visit. The documentation on 06/22/22 for the exam, diagnosis, procedures, and orders are all accurate and complete.   Signed, [name], MD PhD 06/22/2022     Ginger Blue

## 2022-06-22 NOTE — Patient Instructions (Signed)
Medication Instructions:  Continue current medications  *If you need a refill on your cardiac medications before your next appointment, please call your pharmacy*   Lab Work: None Ordered   Testing/Procedures: None Ordered   Follow-Up: At [facility], you and your health needs are our priority.  As part of our continuing mission to provide you with exceptional heart care, we have created designated Provider Care Teams.  These Care Teams include your primary Cardiologist (physician) and Advanced Practice Providers (APPs -  Physician Assistants and Nurse Practitioners) who all work together to provide you with the care you need, when you need it.  We recommend signing up for the patient portal called "MyChart".  Sign up information is provided on this After Visit Summary.  MyChart is used to connect with patients for Virtual Visits (Telemedicine).  Patients are able to view lab/test results, encounter notes, upcoming appointments, etc.  Non-urgent messages can be sent to your provider as well.   To learn more about what you can do with MyChart, go to [email or web address].    Your next appointment:   6 month(s)  The format for your next appointment:   In Person  Provider:   Dr Harrell Gave  Other Instructions

## 2022-06-27 ENCOUNTER — Ambulatory Visit (HOSPITAL_BASED_OUTPATIENT_CLINIC_OR_DEPARTMENT_OTHER): Payer: Medicaid Other | Admitting: Family Medicine

## 2022-06-27 ENCOUNTER — Other Ambulatory Visit (HOSPITAL_BASED_OUTPATIENT_CLINIC_OR_DEPARTMENT_OTHER): Payer: Self-pay

## 2022-06-27 DIAGNOSIS — F419 Anxiety disorder, unspecified: Secondary | ICD-10-CM

## 2022-06-27 MED ORDER — ESCITALOPRAM OXALATE 20 MG PO TABS: 20.0000 mg | ORAL_TABLET | Freq: Every day | ORAL | 0 refills | Status: DC

## 2022-07-05 ENCOUNTER — Encounter (HOSPITAL_BASED_OUTPATIENT_CLINIC_OR_DEPARTMENT_OTHER): Payer: Self-pay | Admitting: Cardiology

## 2022-07-05 ENCOUNTER — Encounter (HOSPITAL_BASED_OUTPATIENT_CLINIC_OR_DEPARTMENT_OTHER): Payer: Medicaid Other | Admitting: Family Medicine

## 2022-07-05 ENCOUNTER — Telehealth (HOSPITAL_BASED_OUTPATIENT_CLINIC_OR_DEPARTMENT_OTHER): Payer: Medicaid Other | Admitting: Family Medicine

## 2022-07-05 DIAGNOSIS — Z8679 Personal history of other diseases of the circulatory system: Secondary | ICD-10-CM

## 2022-07-05 HISTORY — DX: Personal history of other diseases of the circulatory system: Z86.79

## 2022-07-12 ENCOUNTER — Telehealth: Payer: Medicaid Other | Admitting: Physician Assistant

## 2022-07-12 DIAGNOSIS — B9789 Other viral agents as the cause of diseases classified elsewhere: Secondary | ICD-10-CM

## 2022-07-12 DIAGNOSIS — J019 Acute sinusitis, unspecified: Secondary | ICD-10-CM

## 2022-07-12 MED ORDER — AZELASTINE HCL 0.1 % NA SOLN: 1.0000 | Freq: Two times a day (BID) | NASAL | 0 refills | Status: DC

## 2022-07-12 NOTE — Progress Notes (Signed)
E-Visit for Sinus Problems  We are sorry that you are not feeling well.  Here is how we plan to help!  Based on what you have shared with me it looks like you have sinusitis.  Sinusitis is inflammation and infection in the sinus cavities of the head.  Based on your presentation I believe you most likely have Acute Viral Sinusitis.This is an infection most likely caused by a virus. There is not specific treatment for viral sinusitis other than to help you with the symptoms until the infection runs its course.  You may use an oral decongestant such as Mucinex D or if you have glaucoma or high blood pressure use plain Mucinex. Saline nasal spray help and can safely be used as often as needed for congestion, I have prescribed: Azelastine nasal spray 2 sprays in each nostril twice a day. You could also use the Fluticasone nasal spray that has been previously prescribed for you with this nasal spray as well for the congestion.   Some authorities believe that zinc sprays or the use of Echinacea may shorten the course of your symptoms.  Sinus infections are not as easily transmitted as other respiratory infection, however we still recommend that you avoid close contact with loved ones, especially the very young and elderly.  Remember to wash your hands thoroughly throughout the day as this is the number one way to prevent the spread of infection!  I would recommend to take a Covid 19 test to rule this out as your symptoms could be consistent with that and it has been prevalent in the community again.   Home Care: Only take medications as instructed by your medical team. Do not take these medications with alcohol. A steam or ultrasonic humidifier can help congestion.  You can place a towel over your head and breathe in the steam from hot water coming from a faucet. Avoid close contacts especially the very young and the elderly. Cover your mouth when you cough or sneeze. Always remember to wash your  hands.  Get Help Right Away If: You develop worsening fever or sinus pain. You develop a severe head ache or visual changes. Your symptoms persist after you have completed your treatment plan.  Make sure you Understand these instructions. Will watch your condition. Will get help right away if you are not doing well or get worse.   Thank you for choosing an e-visit.  Your e-visit answers were reviewed by a board certified advanced clinical practitioner to complete your personal care plan. Depending upon the condition, your plan could have included both over the counter or prescription medications.  Please review your pharmacy choice. Make sure the pharmacy is open so you can pick up prescription now. If there is a problem, you may contact your provider through [employer] and have the prescription routed to another pharmacy.  Your safety is important to [place]. If you have drug allergies check your prescription carefully.   For the next 24 hours you can use MyChart to ask questions about today's visit, request a non-urgent call back, or ask for a work or school excuse. You will get an email in the next two days asking about your experience. I hope that your e-visit has been valuable and will speed your recovery.  I have spent 5 minutes in review of e-visit questionnaire, review and updating patient chart, medical decision making and response to patient.   Mar Daring, PA-C

## 2022-07-21 ENCOUNTER — Other Ambulatory Visit (HOSPITAL_BASED_OUTPATIENT_CLINIC_OR_DEPARTMENT_OTHER): Payer: Self-pay | Admitting: Family Medicine

## 2022-07-21 DIAGNOSIS — F419 Anxiety disorder, unspecified: Secondary | ICD-10-CM

## 2022-07-24 ENCOUNTER — Other Ambulatory Visit (HOSPITAL_BASED_OUTPATIENT_CLINIC_OR_DEPARTMENT_OTHER): Payer: Self-pay | Admitting: Family Medicine

## 2022-07-24 ENCOUNTER — Telehealth (HOSPITAL_BASED_OUTPATIENT_CLINIC_OR_DEPARTMENT_OTHER): Payer: Self-pay | Admitting: Cardiology

## 2022-07-24 ENCOUNTER — Other Ambulatory Visit: Payer: Self-pay

## 2022-07-24 DIAGNOSIS — Q231 Congenital insufficiency of aortic valve: Secondary | ICD-10-CM

## 2022-07-24 DIAGNOSIS — F419 Anxiety disorder, unspecified: Secondary | ICD-10-CM

## 2022-07-24 MED ORDER — WARFARIN SODIUM 5 MG PO TABS: ORAL_TABLET | ORAL | 3 refills | Status: DC

## 2022-07-24 NOTE — Telephone Encounter (Signed)
*  STAT* If patient is at the pharmacy, call can be transferred to refill team.   1. Which medications need to be refilled? (please list name of each medication and dose if known) warfarin (COUMADIN) 5 MG tablet  2. Which pharmacy/location (including street and city if local pharmacy) is medication to be sent to? [id number]- [place], [place] - [address] AT [address]. Do they need a 30 day or 90 day supply? 30 day supply   Only has 1 tablet left.

## 2022-07-24 NOTE — Telephone Encounter (Signed)
Refill request for warfarin:  Last INR was 2.5 on 06/15/22 Next INR due 07/27/22 LOV was 06/22/22  Refill approved.

## 2022-07-27 ENCOUNTER — Ambulatory Visit (INDEPENDENT_AMBULATORY_CARE_PROVIDER_SITE_OTHER): Payer: Medicaid Other | Admitting: Family Medicine

## 2022-07-27 ENCOUNTER — Ambulatory Visit: Payer: Medicaid Other | Attending: Cardiology

## 2022-07-27 ENCOUNTER — Encounter (HOSPITAL_BASED_OUTPATIENT_CLINIC_OR_DEPARTMENT_OTHER): Payer: Self-pay | Admitting: Family Medicine

## 2022-07-27 VITALS — BP 143/59 | HR 64 | Temp 97.8°F | Ht 67.0 in | Wt 272.2 lb

## 2022-07-27 DIAGNOSIS — Z952 Presence of prosthetic heart valve: Secondary | ICD-10-CM

## 2022-07-27 DIAGNOSIS — I351 Nonrheumatic aortic (valve) insufficiency: Secondary | ICD-10-CM

## 2022-07-27 DIAGNOSIS — Z0000 Encounter for general adult medical examination without abnormal findings: Secondary | ICD-10-CM

## 2022-07-27 DIAGNOSIS — Z7901 Long term (current) use of anticoagulants: Secondary | ICD-10-CM | POA: Diagnosis not present

## 2022-07-27 DIAGNOSIS — Z1231 Encounter for screening mammogram for malignant neoplasm of breast: Secondary | ICD-10-CM | POA: Diagnosis not present

## 2022-07-27 DIAGNOSIS — Z23 Encounter for immunization: Secondary | ICD-10-CM

## 2022-07-27 LAB — POCT INR: INR: 3.6 — AB (ref 2.0–3.0)

## 2022-07-27 NOTE — Progress Notes (Addendum)
Subjective:    CC: Annual Physical Exam  HPI:  Mary Chambers is a 51 y.o. presenting for annual physical  I reviewed the past medical history, family history, social history, surgical history, and allergies today and no changes were needed.  Please see the problem list section below in epic for further details.  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   Arthritis    Depression    Hypertension    Kidney stone    Obesity    Sleep apnea 06/25/2018   Past Surgical History: Past Surgical History:  Procedure Laterality Date   ABDOMINAL HYSTERECTOMY     AORTIC VALVE REPLACEMENT N/A 11/07/2021   Procedure: AORTIC VALVE REPLACEMENT USING A 23MM On-X AORTIC VALVE.;  Surgeon: Lajuana Matte, MD;  Location: [facility];  Service: Open Heart Surgery;  Laterality: N/A;   BACK SURGERY     BUBBLE STUDY  08/31/2021   Procedure: BUBBLE STUDY;  Surgeon: Freada Bergeron, MD;  Location: [place];  Service: Cardiovascular;;   CARPAL TUNNEL RELEASE     CHOLECYSTECTOMY     IR FLUORO GUIDED NEEDLE PLC ASPIRATION/INJECTION LOC  06/23/2021   LITHOTRIPSY     neck fusion     RIGHT/LEFT HEART CATH AND CORONARY ANGIOGRAPHY N/A 10/07/2021   Procedure: RIGHT/LEFT HEART CATH AND CORONARY ANGIOGRAPHY;  Surgeon: Jettie Booze, MD;  Location: [place];  Service: Cardiovascular;  Laterality: N/A;   TEE WITHOUT CARDIOVERSION N/A 06/28/2021   Procedure: TRANSESOPHAGEAL ECHOCARDIOGRAM (TEE);  Surgeon: Lelon Perla, MD;  Location[facility];  Service: Cardiovascular;  Laterality: N/A;   TEE WITHOUT CARDIOVERSION N/A 08/31/2021   Procedure: TRANSESOPHAGEAL ECHOCARDIOGRAM (TEE);  Surgeon: Freada Bergeron, MD;  Location: [place];  Service: Cardiovascular;  Laterality: N/A;   TEE WITHOUT CARDIOVERSION N/A 11/07/2021   Procedure: TRANSESOPHAGEAL ECHOCARDIOGRAM (TEE);  Surgeon:  Lajuana Matte, MD;  Location: [place];  Service: Open Heart Surgery;  Laterality: N/A;   TUBAL LIGATION     Social History: Social History   Socioeconomic History   Marital status: Single    Spouse name: Not on file   Number of children: Not on file   Years of education: Not on file   Highest education level: Not on file  Occupational History   Not on file  Tobacco Use   Smoking status: Never   Smokeless tobacco: Never  Vaping Use   Vaping Use: Never used  Substance and Sexual Activity   Alcohol use: No   Drug use: Never   Sexual activity: Not Currently    Birth control/protection: None  Other Topics Concern   Not on file  Social History Narrative   Not on file   Social Determinants of Health   Financial Resource Strain: Not on file  Food Insecurity: Not on file  Transportation Needs: Not on file  Physical Activity: Not on file  Stress: Not on file  Social Connections: Not on file   Family History: Family History  Problem Relation Age of Onset   Diabetes Mother    Hypertension Mother    Cancer Father    Parkinson's disease Father    Heart disease Father    Heart disease Sister    Heart disease Brother    Colon cancer Neg Hx    Rectal cancer Neg Hx    Stomach cancer Neg Hx  Allergies: Allergies  Allergen Reactions   Baclofen Hives   Dilaudid [Hydromorphone] Itching   Oxycodone Hcl Itching   Percocet [Oxycodone-Acetaminophen] Hives   Septra [Sulfamethoxazole-Trimethoprim] Hives   Vibramycin [Doxycycline] Itching   Neurontin [Gabapentin] Rash   Medications: See med rec.  Review of Systems: No headache, visual changes, nausea, vomiting, diarrhea, constipation, dizziness, abdominal pain, skin rash, fevers, chills, night sweats, swollen lymph nodes, weight loss, chest pain, body aches, joint swelling, muscle aches, shortness of breath, mood changes, visual or auditory hallucinations.  Objective:    BP (!) 143/59   Pulse 64   Temp 97.8 F (36.6  C) (Oral)   Ht '5\' 7"'$  (1.702 m)   Wt 272 lb 3.2 oz (123.5 kg)   SpO2 97%   BMI 42.63 kg/m   General: Well Developed, well nourished, and in no acute distress.  Neuro: Alert and oriented x3, extra-ocular muscles intact, sensation grossly intact. Cranial nerves II through XII are intact, motor, sensory, and coordinative functions are all intact. HEENT: Normocephalic, atraumatic, pupils equal round reactive to light, neck supple, no masses, no lymphadenopathy, thyroid nonpalpable. Oropharynx, nasopharynx, external ear canals are unremarkable. Skin: Warm and dry, no rashes noted. Cardiac: Regular rate and rhythm, no murmurs rubs or gallops. Respiratory: Clear to auscultation bilaterally. Not using accessory muscles, speaking in full sentences. Abdominal: Soft, nontender, nondistended, positive bowel sounds, no masses, no organomegaly. Musculoskeletal: Shoulder, elbow, wrist, hip, knee, ankle stable, and with full range of motion.  Impression and Recommendations:    Wellness examination Routine HCM labs ordered. HCM reviewed/discussed. Anticipatory guidance regarding healthy weight, lifestyle and choices given. Recommend healthy diet.  Recommend approximately 150 minutes/week of moderate intensity exercise Recommend regular dental and vision exams Always use seatbelt/lap and shoulder restraints Recommend using smoke alarms and checking batteries at least twice a year Recommend using sunscreen when outside Discussed colon cancer screening recommendations, options.  Patient will consider and let us know how he would like to proceed Discussed recommendations for shingles vaccine.  Patient will look to set this up at the pharmacy Discussed tetanus immunization recommendations, patient is UTD Recommend seasonal influenza vaccine, patient amenable, administered today  Patient does require use of incontinence pull-ups/bladder complaints related to intermittent urinary urgency with underlying  mobility difficulties.  Because of the combination of these factors, she will have trouble with making it to the bathroom fast enough.  Return in about 6 months (around 01/26/2023).   ___________________________________________ Zayaan Kozak de [place], MD, ABFM, CAQSM [facility] and [place]

## 2022-07-27 NOTE — Patient Instructions (Signed)
  Medication Instructions:  Your physician recommends that you continue on your current medications as directed. Please refer to the Current Medication list given to you today. --If you need a refill on any your medications before your next appointment, please call your pharmacy first. If no refills are authorized on file call the office.-- Lab Work: Your physician has recommended that you have lab work today: Yes If you have labs (blood work) drawn today and your tests are completely normal, you will receive your results via [place] a phone call from our staff.  Please ensure you check your voicemail in the event that you authorized detailed messages to be left on a delegated number. If you have any lab test that is abnormal or we need to change your treatment, we will call you to review the results.  Referrals/Procedures/Imaging: Yes  Follow-Up: Your next appointment:   Your physician recommends that you schedule a follow-up appointment in: 4-6 weeks with Dr. de [place].  You will receive a text message or e-mail with a link to a survey about your care and experience with [place] today! We would greatly appreciate your feedback!   Thanks for letting us be apart of your health journey!!  Primary Care and Sports Medicine   Dr. Arlina Robes [place]   We encourage you to activate your patient portal called "MyChart".  Sign up information is provided on this After Visit Summary.  MyChart is used to connect with patients for Virtual Visits (Telemedicine).  Patients are able to view lab/test results, encounter notes, upcoming appointments, etc.  Non-urgent messages can be sent to your provider as well. To learn more about what you can do with MyChart, please visit --  [email or web address].

## 2022-07-27 NOTE — Patient Instructions (Signed)
Description   Hold tomorrow's dose and then continue taking Warfarin 1 tablet ('5mg'$ ) daily except 1.5 tablets (7.'5mg'$ ) on Mondays. Keep the number of servings of green leafy vegetables consistent in your diet. Recheck in 5 weeks. Coumadin Clinic [phone] call with medication changes and or bleeding.

## 2022-07-27 NOTE — Assessment & Plan Note (Addendum)
Routine HCM labs ordered. HCM reviewed/discussed. Anticipatory guidance regarding healthy weight, lifestyle and choices given. Recommend healthy diet.  Recommend approximately 150 minutes/week of moderate intensity exercise Recommend regular dental and vision exams Always use seatbelt/lap and shoulder restraints Recommend using smoke alarms and checking batteries at least twice a year Recommend using sunscreen when outside Discussed colon cancer screening recommendations, options.  Patient will consider and let us know how he would like to proceed Discussed recommendations for shingles vaccine.  Patient will look to set this up at the pharmacy Discussed tetanus immunization recommendations, patient is UTD Recommend seasonal influenza vaccine, patient amenable, administered today

## 2022-07-28 ENCOUNTER — Other Ambulatory Visit: Payer: Self-pay | Admitting: Family Medicine

## 2022-07-28 DIAGNOSIS — Z1231 Encounter for screening mammogram for malignant neoplasm of breast: Secondary | ICD-10-CM

## 2022-07-28 LAB — COMPREHENSIVE METABOLIC PANEL
ALT: 12 IU/L (ref 0–32)
AST: 21 IU/L (ref 0–40)
Albumin/Globulin Ratio: 1.8 (ref 1.2–2.2)
Albumin: 4.2 g/dL (ref 3.8–4.9)
Alkaline Phosphatase: 92 IU/L (ref 44–121)
BUN/Creatinine Ratio: 14 (ref 9–23)
BUN: 15 mg/dL (ref 6–24)
Bilirubin Total: <0.2 mg/dL (ref 0.0–1.2)
CO2: 24 mmol/L (ref 20–29)
Calcium: 9 mg/dL (ref 8.7–10.2)
Chloride: 105 mmol/L (ref 96–106)
Creatinine, Ser: 1.05 mg/dL — ABNORMAL HIGH (ref 0.57–1.00)
Globulin, Total: 2.3 g/dL (ref 1.5–4.5)
Glucose: 82 mg/dL (ref 70–99)
Potassium: 4.8 mmol/L (ref 3.5–5.2)
Sodium: 141 mmol/L (ref 134–144)
Total Protein: 6.5 g/dL (ref 6.0–8.5)
eGFR: 64 mL/min/{1.73_m2} (ref >59)

## 2022-07-28 LAB — LIPID PANEL
Chol/HDL Ratio: 3.6 ratio (ref 0.0–4.4)
Cholesterol, Total: 192 mg/dL (ref 100–199)
HDL: 54 mg/dL (ref >39)
LDL Chol Calc (NIH): 108 mg/dL — ABNORMAL HIGH (ref 0–99)
Triglycerides: 172 mg/dL — ABNORMAL HIGH (ref 0–149)
VLDL Cholesterol Cal: 30 mg/dL (ref 5–40)

## 2022-07-28 LAB — CBC WITH DIFFERENTIAL/PLATELET
Basophils Absolute: 0.1 10*3/uL (ref 0.0–0.2)
Basos: 1 %
EOS (ABSOLUTE): 0.3 10*3/uL (ref 0.0–0.4)
Eos: 3 %
Hematocrit: 35.2 % (ref 34.0–46.6)
Hemoglobin: 10.6 g/dL — ABNORMAL LOW (ref 11.1–15.9)
Immature Grans (Abs): 0 10*3/uL (ref 0.0–0.1)
Immature Granulocytes: 0 %
Lymphocytes Absolute: 3.4 10*3/uL — ABNORMAL HIGH (ref 0.7–3.1)
Lymphs: 36 %
MCH: 22.2 pg — ABNORMAL LOW (ref 26.6–33.0)
MCHC: 30.1 g/dL — ABNORMAL LOW (ref 31.5–35.7)
MCV: 74 fL — ABNORMAL LOW (ref 79–97)
Monocytes Absolute: 0.5 10*3/uL (ref 0.1–0.9)
Monocytes: 6 %
Neutrophils Absolute: 5 10*3/uL (ref 1.4–7.0)
Neutrophils: 54 %
Platelets: 313 10*3/uL (ref 150–450)
RBC: 4.77 x10E6/uL (ref 3.77–5.28)
RDW: 16.2 % — ABNORMAL HIGH (ref 11.7–15.4)
WBC: 9.4 10*3/uL (ref 3.4–10.8)

## 2022-07-28 LAB — HEMOGLOBIN A1C
Est. average glucose Bld gHb Est-mCnc: 123 mg/dL
Hgb A1c MFr Bld: 5.9 % — ABNORMAL HIGH (ref 4.8–5.6)

## 2022-07-28 LAB — TSH RFX ON ABNORMAL TO FREE T4: TSH: 3.2 u[IU]/mL (ref 0.450–4.500)

## 2022-08-04 ENCOUNTER — Other Ambulatory Visit (HOSPITAL_BASED_OUTPATIENT_CLINIC_OR_DEPARTMENT_OTHER): Payer: Self-pay

## 2022-08-04 DIAGNOSIS — E7800 Pure hypercholesterolemia, unspecified: Secondary | ICD-10-CM

## 2022-08-04 MED ORDER — ATORVASTATIN CALCIUM 10 MG PO TABS: 10.0000 mg | ORAL_TABLET | Freq: Every evening | ORAL | 1 refills | Status: DC

## 2022-08-05 ENCOUNTER — Telehealth: Payer: Medicaid Other | Admitting: Nurse Practitioner

## 2022-08-05 DIAGNOSIS — J040 Acute laryngitis: Secondary | ICD-10-CM

## 2022-08-05 DIAGNOSIS — J029 Acute pharyngitis, unspecified: Secondary | ICD-10-CM

## 2022-08-05 NOTE — Progress Notes (Signed)
E-Visit for Sore Throat  We are sorry that you are not feeling well.  Here is how we plan to help! Work note is is in your my chart Your symptoms indicate a likely viral infection (Pharyngitis).   Pharyngitis is inflammation in the back of the throat which can cause a sore throat, scratchiness and sometimes difficulty swallowing.   Pharyngitis is typically caused by a respiratory virus and will just run its course.  Please keep in mind that your symptoms could last up to 10 days.  For throat pain, we recommend over the counter oral pain relief medications such as acetaminophen or aspirin, or anti-inflammatory medications such as ibuprofen or naproxen sodium.  Topical treatments such as oral throat lozenges or sprays may be used as needed.  Avoid close contact with loved ones, especially the very young and elderly.  Remember to wash your hands thoroughly throughout the day as this is the number one way to prevent the spread of infection and wipe down door knobs and counters with disinfectant. Pharyngitis can often lead to laryngitis. Laryngitis is the inflammation of vocal cords that weill not allow sounds such as words to come out. After careful review of your answers, I would not recommend an antibiotic for your condition.  Antibiotics should not be used to treat conditions that we suspect are caused by viruses like the virus that causes the common cold or flu. However, some people can have Strep with atypical symptoms. You may need formal testing in clinic or office to confirm if your symptoms continue or worsen.  Providers prescribe antibiotics to treat infections caused by bacteria. Antibiotics are very powerful in treating bacterial infections when they are used properly.  To maintain their effectiveness, they should be used only when necessary.  Overuse of antibiotics has resulted in the development of super bugs that are resistant to treatment!    Home Care: Only take medications as instructed by  your medical team. Do not drink alcohol while taking these medications. A steam or ultrasonic humidifier can help congestion.  You can place a towel over your head and breathe in the steam from hot water coming from a faucet. Rest your voice Avoid close contacts especially the very young and the elderly. Cover your mouth when you cough or sneeze. Always remember to wash your hands.  Get Help Right Away If: You develop worsening fever or throat pain. You develop a severe head ache or visual changes. Your symptoms persist after you have completed your treatment plan.  Make sure you Understand these instructions. Will watch your condition. Will get help right away if you are not doing well or get worse.   Thank you for choosing an e-visit.  Your e-visit answers were reviewed by a board certified advanced clinical practitioner to complete your personal care plan. Depending upon the condition, your plan could have included both over the counter or prescription medications.  Please review your pharmacy choice. Make sure the pharmacy is open so you can pick up prescription now. If there is a problem, you may contact your provider through [employer] and have the prescription routed to another pharmacy.  Your safety is important to [place]. If you have drug allergies check your prescription carefully.   For the next 24 hours you can use MyChart to ask questions about today's visit, request a non-urgent call back, or ask for a work or school excuse. You will get an email in the next two days asking about your experience. I  hope that your e-visit has been valuable and will speed your recovery.  Mary-Margaret Hassell Done, FNP   5-10 minutes spent reviewing and documenting in chart.

## 2022-08-15 ENCOUNTER — Encounter (HOSPITAL_BASED_OUTPATIENT_CLINIC_OR_DEPARTMENT_OTHER): Payer: Self-pay | Admitting: Family Medicine

## 2022-08-15 ENCOUNTER — Encounter (HOSPITAL_BASED_OUTPATIENT_CLINIC_OR_DEPARTMENT_OTHER): Payer: Self-pay

## 2022-08-15 DIAGNOSIS — F419 Anxiety disorder, unspecified: Secondary | ICD-10-CM

## 2022-08-15 DIAGNOSIS — R609 Edema, unspecified: Secondary | ICD-10-CM

## 2022-08-15 DIAGNOSIS — E7800 Pure hypercholesterolemia, unspecified: Secondary | ICD-10-CM

## 2022-08-15 MED ORDER — ATORVASTATIN CALCIUM 10 MG PO TABS: 10.0000 mg | ORAL_TABLET | Freq: Every evening | ORAL | 3 refills | Status: DC

## 2022-08-15 MED ORDER — PANTOPRAZOLE SODIUM 40 MG PO TBEC: 40.0000 mg | DELAYED_RELEASE_TABLET | Freq: Every day | ORAL | 3 refills | Status: DC

## 2022-08-15 MED ORDER — ASPIRIN 81 MG PO TBEC: 81.0000 mg | DELAYED_RELEASE_TABLET | Freq: Every day | ORAL | 11 refills | Status: AC

## 2022-08-15 MED ORDER — METOPROLOL TARTRATE 25 MG PO TABS: 12.5000 mg | ORAL_TABLET | Freq: Two times a day (BID) | ORAL | 3 refills | Status: DC

## 2022-08-15 MED ORDER — FUROSEMIDE 20 MG PO TABS: 20.0000 mg | ORAL_TABLET | Freq: Every day | ORAL | 3 refills | Status: AC

## 2022-08-18 MED ORDER — ESCITALOPRAM OXALATE 20 MG PO TABS: 20.0000 mg | ORAL_TABLET | Freq: Every day | ORAL | 0 refills | Status: DC

## 2022-08-30 ENCOUNTER — Telehealth: Payer: Medicaid Other | Admitting: Physician Assistant

## 2022-08-30 DIAGNOSIS — J019 Acute sinusitis, unspecified: Secondary | ICD-10-CM | POA: Diagnosis not present

## 2022-08-30 DIAGNOSIS — B9689 Other specified bacterial agents as the cause of diseases classified elsewhere: Secondary | ICD-10-CM | POA: Diagnosis not present

## 2022-08-30 MED ORDER — CLINDAMYCIN HCL 300 MG PO CAPS: 300.0000 mg | ORAL_CAPSULE | Freq: Two times a day (BID) | ORAL | 0 refills | Status: DC

## 2022-08-30 NOTE — Progress Notes (Signed)
E-Visit for Sinus Problems  We are sorry that you are not feeling well.  Here is how we plan to help!  Based on what you have shared with me it looks like you have sinusitis.  Sinusitis is inflammation and infection in the sinus cavities of the head.  Based on your presentation I believe you most likely have Acute Bacterial Sinusitis.  This is an infection caused by bacteria and is treated with antibiotics. I have prescribed Clindamycin '300mg'$  Take 1 capsule twice daily for 10 days (clindamycin was chosen as it should not affect your INR and coumadin dose would not need to be adjusted). You may use an oral decongestant such as Mucinex D or if you have glaucoma or high blood pressure use plain Mucinex. Saline nasal spray help and can safely be used as often as needed for congestion.  If you develop worsening sinus pain, fever or notice severe headache and vision changes, or if symptoms are not better after completion of antibiotic, please schedule an appointment with a health care provider.    Sinus infections are not as easily transmitted as other respiratory infection, however we still recommend that you avoid close contact with loved ones, especially the very young and elderly.  Remember to wash your hands thoroughly throughout the day as this is the number one way to prevent the spread of infection!  Home Care: Only take medications as instructed by your medical team. Complete the entire course of an antibiotic. Do not take these medications with alcohol. A steam or ultrasonic humidifier can help congestion.  You can place a towel over your head and breathe in the steam from hot water coming from a faucet. Avoid close contacts especially the very young and the elderly. Cover your mouth when you cough or sneeze. Always remember to wash your hands.  Get Help Right Away If: You develop worsening fever or sinus pain. You develop a severe head ache or visual changes. Your symptoms persist after you  have completed your treatment plan.  Make sure you Understand these instructions. Will watch your condition. Will get help right away if you are not doing well or get worse.  Thank you for choosing an e-visit.  Your e-visit answers were reviewed by a board certified advanced clinical practitioner to complete your personal care plan. Depending upon the condition, your plan could have included both over the counter or prescription medications.  Please review your pharmacy choice. Make sure the pharmacy is open so you can pick up prescription now. If there is a problem, you may contact your provider through [employer] and have the prescription routed to another pharmacy.  Your safety is important to [place]. If you have drug allergies check your prescription carefully.   For the next 24 hours you can use MyChart to ask questions about today's visit, request a non-urgent call back, or ask for a work or school excuse. You will get an email in the next two days asking about your experience. I hope that your e-visit has been valuable and will speed your recovery.  I have spent 5 minutes in review of e-visit questionnaire, review and updating patient chart, medical decision making and response to patient.   Mar Daring, PA-C

## 2022-08-31 ENCOUNTER — Ambulatory Visit: Payer: Medicaid Other | Attending: Cardiology | Admitting: *Deleted

## 2022-08-31 ENCOUNTER — Ambulatory Visit: Payer: Medicaid Other

## 2022-08-31 DIAGNOSIS — I351 Nonrheumatic aortic (valve) insufficiency: Secondary | ICD-10-CM

## 2022-08-31 DIAGNOSIS — Z952 Presence of prosthetic heart valve: Secondary | ICD-10-CM

## 2022-08-31 DIAGNOSIS — Z7901 Long term (current) use of anticoagulants: Secondary | ICD-10-CM

## 2022-08-31 LAB — POCT INR: INR: 2.4 (ref 2.0–3.0)

## 2022-08-31 NOTE — Patient Instructions (Signed)
Description   Continue taking Warfarin 1 tablet ('5mg'$ ) daily except 1.5 tablets (7.'5mg'$ ) on Mondays. Keep the number of servings of green leafy vegetables consistent in your diet. Recheck in 6 weeks. Coumadin Clinic [phone] call with medication changes and or bleeding.

## 2022-09-06 MED ORDER — NAPROXEN 250 MG PO TABS: 250.0000 mg | ORAL_TABLET | Freq: Two times a day (BID) | ORAL | 1 refills | Status: DC | PRN

## 2022-09-14 ENCOUNTER — Other Ambulatory Visit (HOSPITAL_BASED_OUTPATIENT_CLINIC_OR_DEPARTMENT_OTHER): Payer: Self-pay | Admitting: Family Medicine

## 2022-09-14 DIAGNOSIS — F419 Anxiety disorder, unspecified: Secondary | ICD-10-CM

## 2022-09-15 ENCOUNTER — Encounter (HOSPITAL_BASED_OUTPATIENT_CLINIC_OR_DEPARTMENT_OTHER): Payer: Self-pay

## 2022-09-21 ENCOUNTER — Ambulatory Visit (INDEPENDENT_AMBULATORY_CARE_PROVIDER_SITE_OTHER): Payer: Medicaid Other | Admitting: Family

## 2022-09-21 ENCOUNTER — Ambulatory Visit: Payer: Medicaid Other | Attending: Family

## 2022-09-21 ENCOUNTER — Encounter (HOSPITAL_BASED_OUTPATIENT_CLINIC_OR_DEPARTMENT_OTHER): Payer: Self-pay | Admitting: Family

## 2022-09-21 VITALS — BP 134/88 | HR 67 | Ht 67.0 in | Wt 255.0 lb

## 2022-09-21 DIAGNOSIS — R002 Palpitations: Secondary | ICD-10-CM

## 2022-09-21 DIAGNOSIS — D6859 Other primary thrombophilia: Secondary | ICD-10-CM | POA: Diagnosis not present

## 2022-09-21 DIAGNOSIS — Q231 Congenital insufficiency of aortic valve: Secondary | ICD-10-CM

## 2022-09-21 DIAGNOSIS — R0789 Other chest pain: Secondary | ICD-10-CM | POA: Diagnosis not present

## 2022-09-21 DIAGNOSIS — R519 Headache, unspecified: Secondary | ICD-10-CM | POA: Diagnosis not present

## 2022-09-21 DIAGNOSIS — Z952 Presence of prosthetic heart valve: Secondary | ICD-10-CM

## 2022-09-21 MED ORDER — AMLODIPINE BESYLATE 2.5 MG PO TABS: 2.5000 mg | ORAL_TABLET | Freq: Every day | ORAL | 2 refills | Status: DC

## 2022-09-21 NOTE — Progress Notes (Unsigned)
Enrolled for [employer] to mail a ZIO XT long term holter monitor to the patients address on file.  

## 2022-09-21 NOTE — Progress Notes (Signed)
Office Visit    Patient Name: Mary Chambers Date of Encounter: 09/21/2022  PCP:  de [place], Raymond J, MD   [place]  Cardiologist:  [name], MD  Advanced Practice Provider:  No care team member to display Electrophysiologist:  None      Chief Complaint    Mary Chambers is a 51 y.o. 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   Arthritis    Depression    Hypertension    Kidney stone    Obesity    Sleep apnea 06/25/2018   Past Surgical History:  Procedure Laterality Date   ABDOMINAL HYSTERECTOMY     AORTIC VALVE REPLACEMENT N/A 11/07/2021   Procedure: AORTIC VALVE REPLACEMENT USING A 23MM On-X AORTIC VALVE.;  Surgeon: Lajuana Matte, MD;  Location: [place];  Service: Open Heart Surgery;  Laterality: N/A;   BACK SURGERY     BUBBLE STUDY  08/31/2021   Procedure: BUBBLE STUDY;  Surgeon: Freada Bergeron, MD;  Location: [place];  Service: Cardiovascular;;   CARPAL TUNNEL RELEASE     CHOLECYSTECTOMY     IR FLUORO GUIDED NEEDLE PLC ASPIRATION/INJECTION LOC  06/23/2021   LITHOTRIPSY     neck fusion     RIGHT/LEFT HEART CATH AND CORONARY ANGIOGRAPHY N/A 10/07/2021   Procedure: RIGHT/LEFT HEART CATH AND CORONARY ANGIOGRAPHY;  Surgeon: Jettie Booze, MD;  Location: [place] [facility];  Service: Cardiovascular;  Laterality: N/A;   TEE WITHOUT CARDIOVERSION N/A 06/28/2021   Procedure: TRANSESOPHAGEAL ECHOCARDIOGRAM (TEE);  Surgeon: Lelon Perla, MD;  Location: [facility];  Service: Cardiovascular;  Laterality: N/A;   TEE WITHOUT CARDIOVERSION N/A 08/31/2021   Procedure: TRANSESOPHAGEAL ECHOCARDIOGRAM (TEE);  Surgeon: Freada Bergeron, MD;  Location: [facility];  Service: Cardiovascular;  Laterality: N/A;   TEE WITHOUT CARDIOVERSION N/A 11/07/2021    Procedure: TRANSESOPHAGEAL ECHOCARDIOGRAM (TEE);  Surgeon: Lajuana Matte, MD;  Location: [place];  Service: Open Heart Surgery;  Laterality: N/A;   TUBAL LIGATION      Allergies  Allergies  Allergen Reactions   Baclofen Hives   Dilaudid [Hydromorphone] Itching   Oxycodone Hcl Itching   Percocet [Oxycodone-Acetaminophen] Hives   Septra [Sulfamethoxazole-Trimethoprim] Hives   Vibramycin [Doxycycline] Itching   Neurontin [Gabapentin] Rash    History of Present Illness    Mary Chambers is a 51 y.o. female with a hx of aortic valve endocarditis s/p AVR 2023, HTN, obesity, arthritis, depression last seen 06/22/22.  Hospitalized 06/2021 for paraspinal abscesses and endocarditis with incidental COVID 19 infection found.Seen 07/14/21 in ED with chest pain with unremarkable EKG. cardiac catheterization 10/07/2021 with no angiographically apparent coronary artery disease, normal LVEF, mild aortic stenosis.  Did note wide pulse pressure likely related to aortic insufficiency.  Carotid duplex 2006 or 72 with no carotid stenosis.  Aortic valve replacement 11/07/2021.  12/21/2021 via MyChart message her metoprolol was reduced to half tablet twice daily due to low blood pressure associated with tremors.  She was last seen on/21/23 by Dr. Harrell Gave.  She noted persistent swelling in her left ankle and foot despite Lasix.  Occasional shortness of breath with overexertion.  No chest pain.  She presents today for follow up independently. Notes last week when she saw her back doctor her blood pressure was high. She gets  headaches associated with seeing stars and headache.  The headache eventually goes away on its own. She completed abx for sinus infection without resolution of symptoms. Her  blood pressure at home is labile. Yesterday morning at 0200 had no energy, wokre up feeling poorly felt like someone "drained all the energy out of her  with BP 100/80 and heart rate 63 bpm with wrist cuff not  previously checked for accuracy.  She is taking her Lasix as needed and has not needed in over a month. She notes she feels a "pinching" sensation and her heart racing. This self resolves after a few minutes. No exertional chest discomfort.  EKGs/Labs/Other Studies Reviewed:   The following studies were reviewed today:  Echo 12/19/2021:  1. Left ventricular ejection fraction, by estimation, is 50 to 55%. The  left ventricle has low normal function. The left ventricle has no regional  wall motion abnormalities. The left ventricular internal cavity size was  moderately dilated. There is mild   left ventricular hypertrophy. Left ventricular diastolic parameters were  normal.   2. Right ventricular systolic function is mildly reduced. The right  ventricular size is mildly enlarged. There is normal pulmonary artery  systolic pressure. The estimated right ventricular systolic pressure is  86.7 mmHg.   3. The mitral valve is normal in structure. Trivial mitral valve  regurgitation. No evidence of mitral stenosis.   4. A small pericardial effusion is present.   5. The inferior vena cava is dilated in size with <50% respiratory  variability, suggesting right atrial pressure of 15 mmHg.   6. There is a 23 mm On-X valve present in the aortic position      Aortic valve regurgitation is trivial. Vmax 2.3 m/s, MG 42mHg, EOA  1.6 cm^2, DI 0.46   Comparison(s): 11/07/2021 TEE-EF 60-65%.    Pre CABG Carotid Dopplers 11/04/2021: Summary:  Right Carotid: The extracranial vessels were near-normal with only minimal wall thickening or plaque.   Left Carotid: The extracranial vessels were near-normal with only minimal  wall thickening or plaque.   Vertebrals:  Bilateral vertebral arteries demonstrate antegrade flow.  Subclavians: Right subclavian artery flow was disturbed. Normal flow               hemodynamics were seen in the left subclavian artery.   Right Upper Extremity: Doppler waveforms decrease  >50% with right radial compression. Doppler waveforms remain within normal limits with right ulnar compression.   Left Upper Extremity: Doppler waveforms remain within normal limits with  left radial compression. Doppler waveforms remain within normal limits  with left ulnar compression.    Echo 11/04/2021:  1. Left ventricular ejection fraction, by estimation, is 55 to 60%. The  left ventricle has normal function. The left ventricle has no regional  wall motion abnormalities. The left ventricular internal cavity size was  mildly dilated. There is mild  concentric left ventricular hypertrophy. Left ventricular diastolic  parameters were normal.   2. Right ventricular systolic function is normal. The right ventricular  size is normal.   3. Left atrial size was mild to moderately dilated.   4. The mitral valve is grossly normal. Trivial mitral valve  regurgitation. No evidence of mitral stenosis.   5. Aortic valve not well visualized on current images but known to be  bicuspid from prior study. Severe eccentric AR, see image 41, jet fills  LVOT. The aortic valve was not well visualized. There is moderate  calcification of the aortic valve. Aortic  valve regurgitation  is severe.   6. The inferior vena cava is normal in size with <50% respiratory  variability, suggesting right atrial pressure of 8 mmHg.   Comparison(s): No significant change from prior study.    Right/Left Heart Cath 10/07/2021:   The left ventricular systolic function is normal.   LV end diastolic pressure is mildly elevated.   There is mild aortic valve stenosis.  Mild aortic valve gradient noted on pullback   No angiographically apparent coronary artery disease.   Ao saturation 96%, PA saturation 71%, PA pressure 35/17, mean PA pressure 25 mmHg; mean pulmonary capillary wedge pressure 17 mmHg; cardiac output 7.02 L/min, cardiac index 3.1.   Wide pulse pressure noted during the case likely related to her aortic  insufficiency.  No coronary artery disease.  Continue with plans for management of aortic regurgitation.   Echo TEE 08/31/21 1. The aortic valve is bicuspid with fusion of the RCC and LCC. The  leaflets are asymmetrically thickened most notably at the [facility] leaflet tip  with concern for vegetation/infective endocarditis. There is flail of one  of the leaflets (suspect NCC) with  lack of central coaptation and resultant severe, highly eccentric aortic  regurgitation.   2. Left ventricular ejection fraction, by estimation, is 65 to 70%. The  left ventricle has normal function.   3. Right ventricular systolic function is normal. The right ventricular  size is normal.   4. No left atrial/left atrial appendage thrombus was detected.   5. The mitral valve is normal in structure. Trivial mitral valve  regurgitation.   6. Aortic dilatation noted. There is borderline dilatation of the aortic  root, measuring 36 mm. There is mild-to-moderate dilatation of the  ascending aorta, measuring 43 mm.   7. The interatrial septum is aneurysmal. There is a PFO demonstrated by  color doppler. Weakly positive agitated saline bubble study.  Comparison(s): Compared to prior TEE on 06/28/21, there continues to be  severe AR with concern for infective endocarditis.    CTA Chest 07/15/2021: COMPARISON:  Chest radiograph 07/14/2021 CT chest 04/22/2020   FINDINGS: Cardiovascular: Good opacification of the central and segmental pulmonary arteries. No focal filling defects. No evidence of significant pulmonary embolus. Normal heart size. No pericardial effusions. Ascending aortic aneurysm measuring 4.1 cm diameter. No aortic dissection. Great vessel origins are patent.   Mediastinum/Nodes: Thyroid gland is unremarkable. Esophagus is decompressed. No significant lymphadenopathy.   Lungs/Pleura: Mild dependent changes in the lung bases. No airspace disease or consolidation. No pleural effusions. No pneumothorax.    Upper Abdomen: Surgical absence of the gallbladder. No acute abnormalities demonstrated in the upper abdomen.   Musculoskeletal: Postoperative changes in the cervical spine and lower thoracic spine. Degenerative changes. No destructive bone lesions.   Review of the MIP images confirms the above findings.   IMPRESSION: 1. No evidence of significant pulmonary embolus. 2. No evidence of active pulmonary disease. 3. 4.1 cm diameter ascending aortic aneurysm. Recommend annual imaging followup by CTA or MRA. This recommendation follows 2010 ACCF/AHA/AATS/ACR/ASA/SCA/SCAI/SIR/STS/SVM Guidelines for the Diagnosis and Management of Patients with Thoracic Aortic Disease. Circulation. 2010; 121: H209-O709. Aortic aneurysm NOS (ICD10-I71.9)   Echo TEE 06/28/2021:  1. Bicuspid aortic valve (left and right cusps appear to be fused);  probable vegetation noted; severe AI.   2. Left ventricular ejection fraction, by estimation, is 60 to 65%. The  left ventricle has normal function. The left ventricle has no regional  wall motion abnormalities.   3. Right ventricular systolic function is normal. The  right ventricular  size is normal.   4. No left atrial/left atrial appendage thrombus was detected.   5. The mitral valve is normal in structure. No evidence of mitral valve  regurgitation.   6. The aortic valve is bicuspid. Aortic valve regurgitation is severe.   Echo 06/26/2021 1. Poor quality study bad acoustic windows limited.   2. Left ventricular ejection fraction, by estimation, is 55 to 60%. The  left ventricle has normal function. Left ventricular diastolic parameters  are indeterminate.   3. The mitral valve was not well visualized. No evidence of mitral valve  regurgitation.   4. AV not well seen Some calcification and ? mild to moderate AR Cannot  r/o vegetation Suggest TEE if clinically indicated . The aortic valve was  not well visualized.   5. The inferior vena cava is normal in  size with greater than 50%  respiratory variability, suggesting right atrial pressure of 3 mmHg.     EKG:  EKG is ordered today.  The ekg ordered today demonstrates NSR 67 bpm with no acute ST/T wave changes.   Recent Labs: 11/08/2021: Magnesium 2.1 07/27/2022: ALT 12; BUN 15; Creatinine, Ser 1.05; Hemoglobin 10.6; Platelets 313; Potassium 4.8; Sodium 141; TSH 3.200  Recent Lipid Panel    Component Value Date/Time   CHOL 192 07/27/2022 1459   TRIG 172 (H) 07/27/2022 1459   HDL 54 07/27/2022 1459   CHOLHDL 3.6 07/27/2022 1459   CHOLHDL 3.3 11/10/2021 0216   VLDL 14 11/10/2021 0216   LDLCALC 108 (H) 07/27/2022 1459     Home Medications   Current Meds  Medication Sig   amLODipine (NORVASC) 2.5 MG tablet Take 1 tablet (2.5 mg total) by mouth daily.   Ascorbic Acid (VITAMIN C) 500 MG CAPS Take 500 mg by mouth in the morning.   aspirin EC 81 MG tablet Take 1 tablet (81 mg total) by mouth daily. Swallow whole.   atorvastatin (LIPITOR) 10 MG tablet Take 1 tablet (10 mg total) by mouth every evening.   cholecalciferol (VITAMIN D3) 25 MCG (1000 UT) tablet Take 1,000 Units by mouth in the morning.   escitalopram (LEXAPRO) 20 MG tablet TAKE 1 TABLET BY MOUTH EVERY DAY   fluticasone (FLONASE) 50 MCG/ACT nasal spray Place 2 sprays into both nostrils daily.   furosemide (LASIX) 20 MG tablet Take 1 tablet (20 mg total) by mouth daily.   loperamide (IMODIUM) 2 MG capsule Take 1 capsule (2 mg total) by mouth as needed for diarrhea or loose stools.   metoprolol tartrate (LOPRESSOR) 25 MG tablet Take 0.5 tablets (12.5 mg total) by mouth 2 (two) times daily.   Multiple Vitamin (MULTIVITAMIN WITH MINERALS) TABS tablet Take 1 tablet by mouth in the morning.   naproxen (NAPROSYN) 250 MG tablet Take 1 tablet (250 mg total) by mouth 2 (two) times daily as needed.   pantoprazole (PROTONIX) 40 MG tablet Take 1 tablet (40 mg total) by mouth daily.   Potassium (POTASSIMIN PO) Take 1 tablet by mouth daily as  needed (with lasix).   tiZANidine (ZANAFLEX) 4 MG tablet Take 4 mg by mouth at bedtime.   warfarin (COUMADIN) 5 MG tablet Take 1 to 1 & 1/2 tablets daily or as directed by Coumadin Clinic     Review of Systems      All other systems reviewed and are otherwise negative except as noted above.  Physical Exam    VS:  BP 134/88   Pulse 67   Ht '5\' 7"'$  (  1.702 m)   Wt 255 lb (115.7 kg)   BMI 39.94 kg/m  , BMI Body mass index is 39.94 kg/m.  Wt Readings from Last 3 Encounters:  09/21/22 255 lb (115.7 kg)  07/27/22 272 lb 3.2 oz (123.5 kg)  06/22/22 271 lb 8 oz (123.2 kg)    GEN: Well nourished, overweight, overweight, well developed, in no acute distress. HEENT: normal. Neck: Supple, no JVD, carotid bruits, or masses. Cardiac: RRR, mechanical valve click noted on exam,  no murmurs, rubs, or gallops. No clubbing, cyanosis, edema.  Radials/PT 2+ and equal bilaterally.  Respiratory:  Respirations regular and unlabored, clear to auscultation bilaterally. GI: Soft, nontender, nondistended. MS: No deformity or atrophy. Skin: Warm and dry, no rash. Neuro:  Strength and sensation are intact. Psych: Normal affect.  Assessment & Plan    Chest pain - Atypical for angina. [place] 10/2021 with no ischemia. No indication for ischemic workup. Reassurance provided.   Palpitations - EKG today no arrhythmias. TSH, CBC, BMP today to assess for thyroid, electrolyte abnormality or anemia. 7 day ZIO placed in clinic.   Headache - Recommend PRN Acetaminophen and follow up with PCP. Likely related to recent sinus infection.   Bicuspid AV and Aortic valve endocarditis s/p AVR with 23 mm On-X valve / Hypercoagulable state - Functioning appropriately by echo 11/2021. Consider repeat echo 12/2022. Continue optimal BP control. Denies bleeding complications on warfarin.   HTN - BP not at goal of <130/80. Start Amlodipine 2.'5mg'$  QHS. Discussed to monitor BP at home at least 2 hours after medications and sitting for 5-10  minutes.   Morbid obesity - Congratulated on weight loss. Weight loss via diet and exercise encouraged. Discussed the impact being overweight would have on cardiovascular risk.         Disposition: Follow up  as scheduled  with [name], MD or APP.  Signed, Loel Dubonnet, NP 09/21/2022, 1:56 PM [facility]

## 2022-09-21 NOTE — Patient Instructions (Addendum)
Medication Instructions:  Your physician has recommended you make the following change in your medication:   START Amlodipine 2.'5mg'$  every evening   *If you need a refill on your cardiac medications before your next appointment, please call your pharmacy*   Lab Work: Your physician recommends that you return for lab work today: BMP, CBC, TSH  If you have labs (blood work) drawn today and your tests are completely normal, you will receive your results only by: [place] (if you have MyChart) OR A paper copy in the mail If you have any lab test that is abnormal or we need to change your treatment, we will call you to review the results.   Testing/Procedures: Your physician has recommended that you wear a Zio monitor.   This monitor is a medical device that records the heart's electrical activity. Doctors most often use these monitors to diagnose arrhythmias. Arrhythmias are problems with the speed or rhythm of the heartbeat. The monitor is a small device applied to your chest. You can wear one while you do your normal daily activities. While wearing this monitor if you have any symptoms to push the button and record what you felt. Once you have worn this monitor for the period of time provider prescribed (for 7 days), you will return the monitor device in the postage paid box. Once it is returned they will download the data collected and provide [place] with a report which the provider will then review and we will call you with those results. Important tips:  Avoid showering during the first 24 hours of wearing the monitor. Avoid excessive sweating to help maximize wear time. Do not submerge the device, no hot tubs, and no swimming pools. Keep any lotions or oils away from the patch. After 24 hours you may shower with the patch on. Take brief showers with your back facing the shower head.  Do not remove patch once it has been placed because that will interrupt data and decrease adhesive wear  time. Push the button when you have any symptoms and write down what you were feeling. Once you have completed wearing your monitor, remove and place into box which has postage paid and place in your outgoing mailbox.  If for some reason you have misplaced your box then call our office and we can provide another box and/or mail it off for you.      Follow-Up: At [facility], you and your health needs are our priority.  As part of our continuing mission to provide you with exceptional heart care, we have created designated Provider Care Teams.  These Care Teams include your primary Cardiologist (physician) and Advanced Practice Providers (APPs -  Physician Assistants and Nurse Practitioners) who all work together to provide you with the care you need, when you need it.  We recommend signing up for the patient portal called "MyChart".  Sign up information is provided on this After Visit Summary.  MyChart is used to connect with patients for Virtual Visits (Telemedicine).  Patients are able to view lab/test results, encounter notes, upcoming appointments, etc.  Non-urgent messages can be sent to your provider as well.   To learn more about what you can do with MyChart, go to [email or web address].    Your next appointment:   As scheduled with Dr. Harrell Gave  Other Instructions  To prevent palpitations: Make sure you are adequately hydrated.  Avoid and/or limit caffeine containing beverages like soda or tea. Exercise regularly.  Manage stress well. Some  over the counter medications can cause palpitations such as Benadryl, AdvilPM, TylenolPM. Regular Advil or Tylenol do not cause palpitations.    Tips to Measure your Blood Pressure Correctly  Please bring blood pressure cuff to your next office visit.   Here's what you can do to ensure a correct reading:  Don't drink a caffeinated beverage or smoke during the 30 minutes before the test.  Sit quietly for five minutes before  the test begins.  During the measurement, sit in a chair with your feet on the floor and your arm supported so your elbow is at about heart level.  The inflatable part of the cuff should completely cover at least 80% of your upper arm, and the cuff should be placed on bare skin, not over a shirt.  Don't talk during the measurement.  Blood pressure categories  Blood pressure category SYSTOLIC (upper number)  DIASTOLIC (lower number)  Normal Less than 120 mm Hg and Less than 80 mm Hg  Elevated 120-129 mm Hg and Less than 80 mm Hg  High blood pressure: Stage 1 hypertension 130-139 mm Hg or 80-89 mm Hg  High blood pressure: Stage 2 hypertension 140 mm Hg or higher or 90 mm Hg or higher  Hypertensive crisis (consult your doctor immediately) Higher than 180 mm Hg and/or Higher than 120 mm Hg  Source: American Heart Association and American Stroke Association. For more on getting your blood pressure under control, buy Controlling Your Blood Pressure, a Special Health Report from [facility].   Blood Pressure Log   Date   Time  Blood Pressure  Example: Nov 1 9 AM 124/78

## 2022-09-22 LAB — CBC
Hematocrit: 38.8 % (ref 34.0–46.6)
Hemoglobin: 11.5 g/dL (ref 11.1–15.9)
MCH: 22.2 pg — ABNORMAL LOW (ref 26.6–33.0)
MCHC: 29.6 g/dL — ABNORMAL LOW (ref 31.5–35.7)
MCV: 75 fL — ABNORMAL LOW (ref 79–97)
Platelets: 323 10*3/uL (ref 150–450)
RBC: 5.17 x10E6/uL (ref 3.77–5.28)
RDW: 16.7 % — ABNORMAL HIGH (ref 11.7–15.4)
WBC: 8.5 10*3/uL (ref 3.4–10.8)

## 2022-09-22 LAB — BASIC METABOLIC PANEL
BUN/Creatinine Ratio: 13 (ref 9–23)
BUN: 13 mg/dL (ref 6–24)
CO2: 18 mmol/L — ABNORMAL LOW (ref 20–29)
Calcium: 9.3 mg/dL (ref 8.7–10.2)
Chloride: 107 mmol/L — ABNORMAL HIGH (ref 96–106)
Creatinine, Ser: 1.01 mg/dL — ABNORMAL HIGH (ref 0.57–1.00)
Glucose: 89 mg/dL (ref 70–99)
Potassium: 4.6 mmol/L (ref 3.5–5.2)
Sodium: 141 mmol/L (ref 134–144)
eGFR: 67 mL/min/{1.73_m2} (ref >59)

## 2022-09-22 LAB — TSH: TSH: 2.67 u[IU]/mL (ref 0.450–4.500)

## 2022-09-28 ENCOUNTER — Other Ambulatory Visit (HOSPITAL_BASED_OUTPATIENT_CLINIC_OR_DEPARTMENT_OTHER): Payer: Self-pay

## 2022-09-28 ENCOUNTER — Encounter (HOSPITAL_BASED_OUTPATIENT_CLINIC_OR_DEPARTMENT_OTHER): Payer: Self-pay

## 2022-09-28 ENCOUNTER — Ambulatory Visit: Payer: Medicaid Other

## 2022-09-28 ENCOUNTER — Encounter (HOSPITAL_BASED_OUTPATIENT_CLINIC_OR_DEPARTMENT_OTHER): Payer: Self-pay | Admitting: Family Medicine

## 2022-09-29 ENCOUNTER — Telehealth: Payer: Medicaid Other | Admitting: Physician Assistant

## 2022-09-29 DIAGNOSIS — B9689 Other specified bacterial agents as the cause of diseases classified elsewhere: Secondary | ICD-10-CM | POA: Diagnosis not present

## 2022-09-29 DIAGNOSIS — J019 Acute sinusitis, unspecified: Secondary | ICD-10-CM | POA: Diagnosis not present

## 2022-09-29 MED ORDER — CLINDAMYCIN HCL 300 MG PO CAPS: 300.0000 mg | ORAL_CAPSULE | Freq: Three times a day (TID) | ORAL | 0 refills | Status: AC

## 2022-09-29 NOTE — Progress Notes (Signed)

## 2022-10-04 ENCOUNTER — Other Ambulatory Visit (HOSPITAL_COMMUNITY): Payer: Self-pay | Admitting: Neurosurgery

## 2022-10-04 DIAGNOSIS — R292 Abnormal reflex: Secondary | ICD-10-CM

## 2022-10-12 ENCOUNTER — Ambulatory Visit: Payer: Medicaid Other | Attending: Cardiology | Admitting: *Deleted

## 2022-10-12 DIAGNOSIS — Z952 Presence of prosthetic heart valve: Secondary | ICD-10-CM

## 2022-10-12 DIAGNOSIS — I351 Nonrheumatic aortic (valve) insufficiency: Secondary | ICD-10-CM | POA: Diagnosis not present

## 2022-10-12 DIAGNOSIS — Z7901 Long term (current) use of anticoagulants: Secondary | ICD-10-CM

## 2022-10-12 LAB — POCT INR: INR: 3.7 — AB (ref 2.0–3.0)

## 2022-10-12 NOTE — Patient Instructions (Addendum)
Description   Do not take any warfarin tomorrow (already taken today's dose) then continue taking Warfarin 1 tablet ('5mg'$ ) daily except 1.5 tablets (7.'5mg'$ ) on Mondays. Keep the number of servings of green leafy vegetables consistent in your diet. Recheck in 4 weeks. Coumadin Clinic [phone] call with medication changes and or bleeding.

## 2022-10-16 ENCOUNTER — Ambulatory Visit (HOSPITAL_COMMUNITY): Admission: RE | Admit: 2022-10-16 | Payer: Medicaid Other | Source: Ambulatory Visit

## 2022-10-16 ENCOUNTER — Encounter (HOSPITAL_COMMUNITY): Payer: Self-pay

## 2022-10-19 ENCOUNTER — Encounter (HOSPITAL_BASED_OUTPATIENT_CLINIC_OR_DEPARTMENT_OTHER): Payer: Self-pay | Admitting: Family Medicine

## 2022-10-20 MED ORDER — TIZANIDINE HCL 4 MG PO TABS: 4.0000 mg | ORAL_TABLET | Freq: Every day | ORAL | 1 refills | Status: DC

## 2022-10-20 MED ORDER — NAPROXEN 250 MG PO TABS: 250.0000 mg | ORAL_TABLET | Freq: Two times a day (BID) | ORAL | 1 refills | Status: DC | PRN

## 2022-11-02 ENCOUNTER — Ambulatory Visit: Payer: Medicaid Other | Admitting: Podiatry

## 2022-11-09 ENCOUNTER — Ambulatory Visit: Payer: Medicaid Other | Attending: Cardiology | Admitting: *Deleted

## 2022-11-09 ENCOUNTER — Other Ambulatory Visit: Payer: Self-pay

## 2022-11-09 ENCOUNTER — Ambulatory Visit (INDEPENDENT_AMBULATORY_CARE_PROVIDER_SITE_OTHER): Payer: Medicaid Other | Admitting: Podiatry

## 2022-11-09 ENCOUNTER — Encounter: Payer: Self-pay | Admitting: Podiatry

## 2022-11-09 ENCOUNTER — Emergency Department (HOSPITAL_COMMUNITY)
Admission: EM | Admit: 2022-11-09 | Discharge: 2022-11-10 | Disposition: A | Discharge status: Home / Self Care | Payer: Medicaid Other | Attending: Emergency Medicine | Admitting: Emergency Medicine

## 2022-11-09 DIAGNOSIS — Z952 Presence of prosthetic heart valve: Secondary | ICD-10-CM

## 2022-11-09 DIAGNOSIS — I10 Essential (primary) hypertension: Secondary | ICD-10-CM | POA: Insufficient documentation

## 2022-11-09 DIAGNOSIS — Z7901 Long term (current) use of anticoagulants: Secondary | ICD-10-CM | POA: Insufficient documentation

## 2022-11-09 DIAGNOSIS — Z7982 Long term (current) use of aspirin: Secondary | ICD-10-CM | POA: Insufficient documentation

## 2022-11-09 DIAGNOSIS — B351 Tinea unguium: Secondary | ICD-10-CM

## 2022-11-09 DIAGNOSIS — L600 Ingrowing nail: Secondary | ICD-10-CM | POA: Diagnosis not present

## 2022-11-09 DIAGNOSIS — Z79899 Other long term (current) drug therapy: Secondary | ICD-10-CM | POA: Insufficient documentation

## 2022-11-09 DIAGNOSIS — L7621 Postprocedural hemorrhage and hematoma of skin and subcutaneous tissue following a dermatologic procedure: Secondary | ICD-10-CM | POA: Diagnosis not present

## 2022-11-09 DIAGNOSIS — I351 Nonrheumatic aortic (valve) insufficiency: Secondary | ICD-10-CM | POA: Diagnosis not present

## 2022-11-09 LAB — POCT INR: INR: 3.6 — AB (ref 2.0–3.0)

## 2022-11-09 MED ORDER — ACETAMINOPHEN 500 MG PO TABS: 1000.0000 mg | ORAL_TABLET | Freq: Once | ORAL | Status: DC

## 2022-11-09 NOTE — Patient Instructions (Addendum)
Description   Do not take any warfarin tomorrow (already taken today's dose) then START taking Warfarin 1 tablet ('5mg'$ ) daily. Keep the number of servings of green leafy vegetables consistent in your diet. Recheck in 2 weeks. Coumadin Clinic [phone] or [phone] call with medication changes and or bleeding.

## 2022-11-09 NOTE — ED Provider Notes (Signed)
[facility] Provider Note  CSN: [id number] Arrival date & time: 11/09/22 1924  Chief Complaint(s) Toes Bleeding   HPI Mary Chambers is a 52 y.o. female with past medical history as below, significant for aortic valve, warfarin use, OSA  who presents to the ED with complaint of bleeding toes.  Patient went to podiatry office today and had 2 toenails removed.  She began bleeding from her wounds shortly after.  Bleeding has persisted, soaked through her bandages, reports the ED for evaluation.  She did not take her Coumadin today, her last INR was 3.7.  No bleeding of any other site, no significant pain to her feet other than soreness where the toenails were removed.  No chest pain or dyspnea.  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   Arthritis    Depression    Hypertension    Kidney stone    Obesity    Sleep apnea 06/25/2018   Patient Active Problem List   Diagnosis Date Noted   Wellness examination 07/27/2022   History of bacterial endocarditis 07/05/2022   Acute thoracic back pain 12/22/2021   Closed fracture of sacrum with routine healing 12/22/2021   Lumbar pseudoarthrosis 12/22/2021   Scoliosis (and kyphoscoliosis), idiopathic 12/22/2021   Insomnia 12/08/2021   Prediabetes 12/08/2021   Long term (current) use of anticoagulants 11/17/2021   S/P AVR (aortic valve replacement) 11/07/2021   Severe aortic regurgitation 07/27/2021   Bicuspid aortic valve 07/27/2021   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   Paraspinal abscess ([place]) 07/26/2021   Vertebral osteomyelitis ([place])    Septic arthritis ([place]) 06/23/2021   Transaminitis 06/23/2021   Abscess 06/23/2021   COVID-19 virus infection 06/23/2021   Gastroesophageal reflux disease 12/15/2020   Screening for colorectal cancer 12/15/2020    History of gastric ulcer 12/15/2020   Malodorous urine 05/13/2020   Thoracic aortic aneurysm ([place]) 05/12/2020   Herniated lumbar disc without myelopathy 10/02/2019   Lumbar spinal stenosis 10/01/2019   Nephrolithiasis 07/29/2019   Prepyloric ulcer 07/17/2018   Sleep apnea 06/25/2018   Pain in left foot 03/29/2018   Arthritis 02/13/2018   Chronic back pain 02/13/2018   Migraines 02/13/2018   Morbidly obese ([place]) 02/13/2018   Synovial cyst of lumbar spine 10/15/2017   Osteoarthritis of knee 07/13/2017   Lumbar disc disease with radiculopathy 08/01/2016   Headache 05/04/2016   Numbness of foot 05/04/2016   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 Authorizing Provider  amLODipine (NORVASC) 2.5 MG tablet Take 1 tablet (2.5 mg total) by mouth daily. 09/21/22 12/20/22  Loel Dubonnet, NP  Ascorbic Acid (VITAMIN C) 500 MG CAPS Take 500 mg by mouth in the morning.    [provider]  aspirin EC 81 MG tablet Take 1 tablet (81 mg total)  by mouth daily. Swallow whole. 08/15/22   [name], MD  atorvastatin (LIPITOR) 10 MG tablet Take 1 tablet (10 mg total) by mouth every evening. 08/15/22   [name], MD  cholecalciferol (VITAMIN D3) 25 MCG (1000 UT) tablet Take 1,000 Units by mouth in the morning.    [provider]  escitalopram (LEXAPRO) 20 MG tablet TAKE 1 TABLET BY MOUTH EVERY  DAY 09/18/22   de [place], Blondell Reveal, MD  fluticasone [facility]) 50 MCG/ACT nasal spray Place 2 sprays into both nostrils daily. 04/22/22   Hassell Done Mary-Margaret, FNP  furosemide (LASIX) 20 MG tablet Take 1 tablet (20 mg total) by mouth daily. 08/15/22   [name], MD  loperamide (IMODIUM) 2 MG capsule Take 1 capsule (2 mg total) by mouth as needed for diarrhea or loose stools. 07/01/21   Nita Sells, MD  metoprolol tartrate (LOPRESSOR) 25 MG tablet Take 0.5 tablets (12.5 mg total) by mouth 2 (two) times daily. 08/15/22   [name], MD  Multiple Vitamin (MULTIVITAMIN WITH MINERALS) TABS tablet Take 1 tablet by mouth in the morning.    [provider]  naproxen (NAPROSYN) 250 MG tablet Take 1 tablet (250 mg total) by mouth 2 (two) times daily as needed. 10/20/22   de [place], Raymond J, MD  pantoprazole (PROTONIX) 40 MG tablet Take 1 tablet (40 mg total) by mouth daily. 08/15/22   [name], MD  Potassium (POTASSIMIN PO) Take 1 tablet by mouth daily as needed (with lasix).    [provider]  tiZANidine (ZANAFLEX) 4 MG tablet Take 1 tablet (4 mg total) by mouth at bedtime. 10/20/22   de [place], Raymond J, MD  warfarin (COUMADIN) 5 MG tablet Take 1 to 1 & 1/2 tablets daily or as directed by Coumadin Clinic 07/24/22   [name], MD                                                                                                                                    Past Surgical History Past Surgical History:  Procedure Laterality Date   ABDOMINAL HYSTERECTOMY     AORTIC VALVE REPLACEMENT N/A 11/07/2021   Procedure: AORTIC VALVE REPLACEMENT USING A 23MM On-X AORTIC VALVE.;  Surgeon: Lajuana Matte, MD;  Location: [facility];  Service: Open Heart Surgery;  Laterality: N/A;   BACK SURGERY     BUBBLE STUDY  08/31/2021   Procedure: BUBBLE STUDY;  Surgeon: Freada Bergeron, MD;  Location: [facility];  Service: Cardiovascular;;   CARPAL  TUNNEL RELEASE     CHOLECYSTECTOMY     IR FLUORO GUIDED NEEDLE PLC ASPIRATION/INJECTION LOC  06/23/2021   LITHOTRIPSY     neck fusion     RIGHT/LEFT HEART CATH AND CORONARY ANGIOGRAPHY N/A 10/07/2021   Procedure: RIGHT/LEFT HEART CATH AND CORONARY ANGIOGRAPHY;  Surgeon: Jettie Booze, MD;  Location: [facility];  Service: Cardiovascular;  Laterality: N/A;  TEE WITHOUT CARDIOVERSION N/A 06/28/2021   Procedure: TRANSESOPHAGEAL ECHOCARDIOGRAM (TEE);  Surgeon: Lelon Perla, MD;  Location[facility];  Service: Cardiovascular;  Laterality: N/A;   TEE WITHOUT CARDIOVERSION N/A 08/31/2021   Procedure: TRANSESOPHAGEAL ECHOCARDIOGRAM (TEE);  Surgeon: Freada Bergeron, MD;  Location: [facility];  Service: Cardiovascular;  Laterality: N/A;   TEE WITHOUT CARDIOVERSION N/A 11/07/2021   Procedure: TRANSESOPHAGEAL ECHOCARDIOGRAM (TEE);  Surgeon: Lajuana Matte, MD;  Location: [facility];  Service: Open Heart Surgery;  Laterality: N/A;   TUBAL LIGATION     Family History Family History  Problem Relation Age of Onset   Diabetes Mother    Hypertension Mother    Cancer Father    Parkinson's disease Father    Heart disease Father    Heart disease Sister    Heart disease Brother    Colon cancer Neg Hx    Rectal cancer Neg Hx    Stomach cancer Neg Hx     Social History Social History   Tobacco Use   Smoking status: Never   Smokeless tobacco: Never  Vaping Use   Vaping Use: Never used  Substance Use Topics   Alcohol use: No   Drug use: Never   Allergies Baclofen, Dilaudid [hydromorphone], Oxycodone hcl, Percocet [oxycodone-acetaminophen], Septra [sulfamethoxazole-trimethoprim], Vibramycin [doxycycline], and Neurontin [gabapentin]  Review of Systems Review of Systems  Constitutional:  Negative for activity change and fever.  HENT:  Negative for facial swelling and trouble swallowing.   Eyes:  Negative for discharge and redness.  Respiratory:  Negative for cough and  shortness of breath.   Cardiovascular:  Negative for chest pain and palpitations.  Gastrointestinal:  Negative for abdominal pain and nausea.  Genitourinary:  Negative for dysuria and flank pain.  Musculoskeletal:  Negative for back pain and gait problem.  Skin:  Positive for wound. Negative for pallor and rash.  Neurological:  Negative for syncope and headaches.    Physical Exam Vital Signs  I have reviewed the triage vital signs BP (!) 153/81 (BP Location: Right Arm)   Pulse 96   Temp 98.7 F (37.1 C) (Oral)   Resp 18   SpO2 97%  Physical Exam Vitals and nursing note reviewed.  Constitutional:      General: She is not in acute distress.    Appearance: She is obese. She is not ill-appearing or diaphoretic.  HENT:     Head: Normocephalic and atraumatic.     Right Ear: External ear normal.     Left Ear: External ear normal.     Nose: Nose normal.     Mouth/Throat:     Mouth: Mucous membranes are moist.  Eyes:     General: No scleral icterus.       Right eye: No discharge.        Left eye: No discharge.  Cardiovascular:     Rate and Rhythm: Normal rate and regular rhythm.     Pulses: Normal pulses.     Heart sounds: Normal heart sounds.  Pulmonary:     Effort: Pulmonary effort is normal. No respiratory distress.     Breath sounds: Normal breath sounds.  Abdominal:     General: Abdomen is flat.     Tenderness: There is no abdominal tenderness.  Musculoskeletal:     Cervical back: No rigidity.     Right lower leg: No edema.     Left lower leg: No edema.  Skin:    General: Skin is warm and dry.  Capillary Refill: Capillary refill takes less than 2 seconds.     Comments: See photo, oozing from nailbeds  Neurological:     Mental Status: She is alert.  Psychiatric:        Mood and Affect: Mood normal.        Behavior: Behavior normal.        ED Results and Treatments Labs (all labs ordered are listed, but only abnormal results are displayed) Labs Reviewed  - No data to display                                                                                                                        Radiology No results found.  Pertinent labs & imaging results that were available during my care of the patient were reviewed by me and considered in my medical decision making (see MDM for details).  Medications Ordered in ED Medications  acetaminophen (TYLENOL) tablet 1,000 mg (has no administration in time range)                                                                                                                                     Procedures Procedures  (including critical care time)  Medical Decision Making / ED Course    Medical Decision Making:    JENAH ELLINGTON is a 52 y.o. female with past medical history as below, significant for aortic valve, warfarin use, OSA  who presents to the ED with complaint of bleeding toes.. The complaint involves an extensive differential diagnosis and also carries with it a high risk of complications and morbidity.  Serious etiology was considered.   Complete initial physical exam performed, notably the patient  was she is resting comfortably, no acute distress.  Dressings were taken down, see photos above.  Will apply  hemostatic dressing and recheck.    Reviewed and confirmed nursing documentation for past medical history, family history, social history.  Vital signs reviewed.   Hemostasis obtained following the use of hemostatic material, dressing changed. Advised to f/u with pcp/podiatry for recheck. Return precautions discussed w/ pt  Given postop shoe for both feet, APAP, socks, wound care instructions were provided.   The patient improved significantly and was discharged in stable condition. Detailed discussions were had with the patient regarding current findings, and need for close f/u with PCP or on call doctor. The patient has been instructed to return  immediately if the symptoms  worsen in any way for re-evaluation. Patient verbalized understanding and is in agreement with current care plan. All questions answered prior to discharge.       Additional history obtained: -Additional history obtained from na -External records from outside source obtained and reviewed including: Chart review including previous notes, labs, imaging, consultation notes including medications, primary care recommendation, prior labs and imaging   Lab Tests: -I ordered, reviewed, and interpreted labs.   na  EKG   EKG Interpretation  Date/Time:    Ventricular Rate:    PR Interval:    QRS Duration:   QT Interval:    QTC Calculation:   R Axis:     Text Interpretation:           Imaging Studies ordered: I ordered imaging studies including na   Medicines ordered and prescription drug management: Meds ordered this encounter  Medications   acetaminophen (TYLENOL) tablet 1,000 mg    -I have reviewed the patients home medicines and have made adjustments as needed   Consultations Obtained: na   Cardiac Monitoring: na  Social Determinants of Health:  Diagnosis or treatment significantly limited by social determinants of health: na   Reevaluation: After the interventions noted above, I reevaluated the patient and found that they have resolved  Co morbidities that complicate the patient evaluation  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    Final Clinical Impression(s) / ED Diagnoses Final diagnoses:  Postoperative hemorrhage of skin following dermatologic procedure     This chart was dictated using voice recognition software.  Despite  best efforts to proofread,  errors can occur which can change the documentation meaning.    Jeanell Sparrow, DO 11/10/22 0012

## 2022-11-09 NOTE — Patient Instructions (Signed)

## 2022-11-09 NOTE — Progress Notes (Signed)
Subjective:   Patient ID: Mary Chambers, female   DOB: 52 y.o.   MRN: [id number]   HPI Patient states she has chronic nail disease that is very difficult for her to take care of she is on blood thinner and if she cuts them they can bleed profusely so she like several nails removed and may have other ones removed in future.  Good health overall   ROS      Objective:  Physical Exam  Neurovascular status was found to be intact good digital perfusion noted damage nailbeds second bilateral are the worst and also on the fourth right fifth bilateral to a moderate nature     Assessment:  Chronic nail disease second bilateral 4 5 right 5 left     Plan:  H&P reviewed condition recommended removal of the nailbed second bilateral explained procedure risk patient wants surgery and wants them removed and I did allow her to read and signed consent form understanding permanent correction.  Today I went ahead and I infiltrated each second toe 60 mg like Marcaine mixture sterile prep done using sterile instrumentation removed the second nails exposed matrix applied phenol for applications 30 seconds followed by alcohol lavage sterile dressing gave instructions on soaks leave dressings on 24 hours take them off earlier if throbbing were to occur encouraged to call questions concerns and may need the adjacent nails removed at 1 point in future

## 2022-11-09 NOTE — ED Triage Notes (Signed)
Patient reports blood at bilateral 2nd toes after toe nails [facility] this [facility] . Advised by her PCP not to take her Coumadin today .

## 2022-11-10 NOTE — Discharge Instructions (Signed)
Please keep initial dressing on for at least 12 hours. Please follow up with your pcp in the next few days for a wound check.   It was a pleasure caring for you today in the emergency department.  Please return to the emergency department for any worsening or worrisome symptoms.

## 2022-11-16 ENCOUNTER — Other Ambulatory Visit (HOSPITAL_BASED_OUTPATIENT_CLINIC_OR_DEPARTMENT_OTHER): Payer: Self-pay | Admitting: Family

## 2022-11-16 DIAGNOSIS — R002 Palpitations: Secondary | ICD-10-CM

## 2022-11-16 NOTE — Telephone Encounter (Signed)
Rx request sent to pharmacy.  

## 2022-11-21 ENCOUNTER — Encounter (HOSPITAL_BASED_OUTPATIENT_CLINIC_OR_DEPARTMENT_OTHER): Payer: Self-pay

## 2022-11-21 NOTE — Telephone Encounter (Signed)
Please advise 

## 2022-11-23 ENCOUNTER — Ambulatory Visit: Payer: Medicaid Other | Attending: Cardiology | Admitting: *Deleted

## 2022-11-23 ENCOUNTER — Ambulatory Visit
Admission: RE | Admit: 2022-11-23 | Discharge: 2022-11-23 | Disposition: A | Discharge status: Home / Self Care | Payer: Medicaid Other | Source: Ambulatory Visit | Attending: Family Medicine | Admitting: Family Medicine

## 2022-11-23 DIAGNOSIS — Z7901 Long term (current) use of anticoagulants: Secondary | ICD-10-CM | POA: Diagnosis not present

## 2022-11-23 DIAGNOSIS — I351 Nonrheumatic aortic (valve) insufficiency: Secondary | ICD-10-CM

## 2022-11-23 DIAGNOSIS — Z952 Presence of prosthetic heart valve: Secondary | ICD-10-CM | POA: Diagnosis not present

## 2022-11-23 DIAGNOSIS — Z1231 Encounter for screening mammogram for malignant neoplasm of breast: Secondary | ICD-10-CM

## 2022-11-23 LAB — POCT INR: INR: 3.6 — AB (ref 2.0–3.0)

## 2022-11-23 NOTE — Patient Instructions (Addendum)
Description   Do not take any warfarin tomorrow (already taken today's dose) then START taking Warfarin 1 tablet (47m) daily except 1/2 tablet (2.558m on Mondays. Keep the number of servings of green leafy vegetables consistent in your diet. Recheck in 3 weeks. [facility]madin Clinic [phone] with medication changes and or bleeding.

## 2022-12-02 ENCOUNTER — Encounter: Payer: Medicaid Other | Admitting: Nurse Practitioner

## 2022-12-03 ENCOUNTER — Telehealth: Payer: Medicaid Other | Admitting: Nurse Practitioner

## 2022-12-03 DIAGNOSIS — J40 Bronchitis, not specified as acute or chronic: Secondary | ICD-10-CM | POA: Diagnosis not present

## 2022-12-03 MED ORDER — ALBUTEROL SULFATE HFA 108 (90 BASE) MCG/ACT IN AERS: 2.0000 | INHALATION_SPRAY | Freq: Four times a day (QID) | RESPIRATORY_TRACT | 0 refills | Status: DC | PRN

## 2022-12-03 MED ORDER — PREDNISONE 10 MG PO TABS: ORAL_TABLET | ORAL | 0 refills | Status: DC

## 2022-12-03 NOTE — Progress Notes (Signed)
It appears azithromycin is not recommended with warfarin. At this time I will send a short dose of steroids instead.   Prednisone 10 mg daily for 6 days (see taper instructions below)  Directions for 6 day taper: Day 1: 2 tablets before breakfast, 1 after both lunch & dinner and 2 at bedtime Day 2: 1 tab before breakfast, 1 after both lunch & dinner and 2 at bedtime Day 3: 1 tab at each meal & 1 at bedtime Day 4: 1 tab at breakfast, 1 at lunch, 1 at bedtime Day 5: 1 tab at breakfast & 1 tab at bedtime Day 6: 1 tab at breakfast  From your responses in the eVisit questionnaire you describe inflammation in the upper respiratory tract which is causing a significant cough.  This is commonly called Bronchitis and has four common causes:   Allergies Viral Infections Acid Reflux Bacterial Infection Allergies, viruses and acid reflux are treated by controlling symptoms or eliminating the cause. An example might be a cough caused by taking certain blood pressure medications. You stop the cough by changing the medication. Another example might be a cough caused by acid reflux. Controlling the reflux helps control the cough.  USE OF BRONCHODILATOR ("RESCUE") INHALERS: There is a risk from using your bronchodilator too frequently.  The risk is that over-reliance on a medication which only relaxes the muscles surrounding the breathing tubes can reduce the effectiveness of medications prescribed to reduce swelling and congestion of the tubes themselves.  Although you feel brief relief from the bronchodilator inhaler, your asthma may actually be worsening with the tubes becoming more swollen and filled with mucus.  This can delay other crucial treatments, such as oral steroid medications. If you need to use a bronchodilator inhaler daily, several times per day, you should discuss this with your provider.  There are probably better treatments that could be used to keep your asthma under control.     HOME  CARE Only take medications as instructed by your medical team. Complete the entire course of an antibiotic. Drink plenty of fluids and get plenty of rest. Avoid close contacts especially the very young and the elderly Cover your mouth if you cough or cough into your sleeve. Always remember to wash your hands A steam or ultrasonic humidifier can help congestion.   GET HELP RIGHT AWAY IF: You develop worsening fever. You become short of breath You cough up blood. Your symptoms persist after you have completed your treatment plan MAKE SURE YOU  Understand these instructions. Will watch your condition. Will get help right away if you are not doing well or get worse.    Thank you for choosing an e-visit.  Your e-visit answers were reviewed by a board certified advanced clinical practitioner to complete your personal care plan. Depending upon the condition, your plan could have included both over the counter or prescription medications.  Please review your pharmacy choice. Make sure the pharmacy is open so you can pick up prescription now. If there is a problem, you may contact your provider through [employer] and have the prescription routed to another pharmacy.  Your safety is important to [place]. If you have drug allergies check your prescription carefully.   For the next 24 hours you can use MyChart to ask questions about today's visit, request a non-urgent call back, or ask for a work or school excuse. You will get an email in the next two days asking about your experience. I hope that your e-visit  has been valuable and will speed your recovery.

## 2022-12-03 NOTE — Addendum Note (Signed)
Addended by: Geryl Rankins on: 12/03/2022 08:02 PM   Modules accepted: Orders

## 2022-12-03 NOTE — Progress Notes (Signed)
I have spent 5 minutes in review of e-visit questionnaire, review and updating patient chart, medical decision making and response to patient.  ° °Zienna Ahlin W Kessa Fairbairn, NP ° °  °

## 2022-12-04 ENCOUNTER — Ambulatory Visit: Payer: Self-pay

## 2022-12-04 MED ORDER — BENZONATATE 100 MG PO CAPS: 100.0000 mg | ORAL_CAPSULE | Freq: Three times a day (TID) | ORAL | 0 refills | Status: DC | PRN

## 2022-12-04 NOTE — Addendum Note (Signed)
Addended by: Mar Daring on: 12/04/2022 07:02 AM   Modules accepted: Orders

## 2022-12-05 NOTE — Progress Notes (Signed)
I recommend you take a covid test and follow up with [place] regarding the results please.      Kind regards     Geryl Rankins FNP-BC

## 2022-12-08 ENCOUNTER — Encounter (HOSPITAL_BASED_OUTPATIENT_CLINIC_OR_DEPARTMENT_OTHER): Payer: Self-pay

## 2022-12-14 ENCOUNTER — Encounter (HOSPITAL_BASED_OUTPATIENT_CLINIC_OR_DEPARTMENT_OTHER): Payer: Self-pay | Admitting: Cardiology

## 2022-12-14 ENCOUNTER — Ambulatory Visit: Payer: Medicaid Other | Attending: Internal Medicine

## 2022-12-14 ENCOUNTER — Ambulatory Visit (INDEPENDENT_AMBULATORY_CARE_PROVIDER_SITE_OTHER): Payer: Medicaid Other | Admitting: Cardiology

## 2022-12-14 VITALS — BP 110/76 | HR 85 | Ht 67.0 in | Wt 279.1 lb

## 2022-12-14 DIAGNOSIS — R5383 Other fatigue: Secondary | ICD-10-CM | POA: Diagnosis not present

## 2022-12-14 DIAGNOSIS — I351 Nonrheumatic aortic (valve) insufficiency: Secondary | ICD-10-CM | POA: Diagnosis not present

## 2022-12-14 DIAGNOSIS — Z7901 Long term (current) use of anticoagulants: Secondary | ICD-10-CM

## 2022-12-14 DIAGNOSIS — Z952 Presence of prosthetic heart valve: Secondary | ICD-10-CM

## 2022-12-14 DIAGNOSIS — D6859 Other primary thrombophilia: Secondary | ICD-10-CM

## 2022-12-14 LAB — POCT INR: INR: 3 (ref 2.0–3.0)

## 2022-12-14 NOTE — Patient Instructions (Signed)
Description   Continue taking Warfarin 1 tablet ('5mg'$ ) daily except 1/2 tablet (2.'5mg'$ ) on Mondays.  Keep the number of servings of green leafy vegetables consistent in your diet.  Recheck in 4 weeks.  Coumadin Clinic [phone] call with medication changes and or bleeding.

## 2022-12-14 NOTE — Progress Notes (Signed)
Cardiology Office Note:    Date:  12/14/2022   ID:  Mary Chambers, DOB [birth year], MRN [id number]  PCP:  de [place], Raymond J, MD  Cardiologist:  [name], MD  Referring MD: de [place], Blondell Reveal, MD   CC: follow up   History of Present Illness:    Mary Chambers is a 52 y.o. female with a hx of aortic valve endocarditis s/p AVR 2023, hypertension, obesity, arthritis, and depression, who is seen for follow-up.    Today, she says she is feeling ok overall but has been dealing with a lot of fatigue. It has been going on for a couple weeks. She was recently on prednisone due to a bout of bronchitis and felt really bad with the prednisone. She felt that her heart was racing and her blood pressure was elevated. The fatigue that she has been experiencing started before the bout of bronchitis that she had. She had a fever but it would break during the night.   She is coughing a bit still, but it is not productive. It has been going on for about a month, and towards the later part of this she noticed now she coughs and then she feels it is contributing to the fatigue. She says that since she had her heart monitor she has been experiencing a consistent downturn in how she is feeling.   When she showers she is still finding herself feeling weak and dizzy. She has been having to modify her routine to keep herself from passing out.   She denies any chest pain, shortness of breath, or peripheral edema. No headaches, syncope, orthopnea, or PND.   (+) fatigue (+) lightheadedness (in shower) (+) Palpitations    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   Arthritis    Depression    Hypertension    Kidney stone    Obesity    Sleep apnea 06/25/2018    Past Surgical History:  Procedure Laterality Date   ABDOMINAL HYSTERECTOMY     AORTIC VALVE REPLACEMENT N/A 11/07/2021   Procedure: AORTIC VALVE  REPLACEMENT USING A 23MM On-X AORTIC VALVE.;  Surgeon: Lajuana Matte, MD;  Location: [place];  Service: Open Heart Surgery;  Laterality: N/A;   BACK SURGERY     BUBBLE STUDY  08/31/2021   Procedure: BUBBLE STUDY;  Surgeon: Freada Bergeron, MD;  Location: [place];  Service: Cardiovascular;;   CARPAL TUNNEL RELEASE     CHOLECYSTECTOMY     IR FLUORO GUIDED NEEDLE PLC ASPIRATION/INJECTION LOC  06/23/2021   LITHOTRIPSY     neck fusion     RIGHT/LEFT HEART CATH AND CORONARY ANGIOGRAPHY N/A 10/07/2021   Procedure: RIGHT/LEFT HEART CATH AND CORONARY ANGIOGRAPHY;  Surgeon: Jettie Booze, MD;  Location: [place];  Service: Cardiovascular;  Laterality: N/A;   TEE WITHOUT CARDIOVERSION N/A 06/28/2021   Procedure: TRANSESOPHAGEAL ECHOCARDIOGRAM (TEE);  Surgeon: Lelon Perla, MD;  Location: [facility] ENDOSCOPY;  Service: Cardiovascular;  Laterality: N/A;   TEE WITHOUT CARDIOVERSION N/A 08/31/2021   Procedure: TRANSESOPHAGEAL ECHOCARDIOGRAM (TEE);  Surgeon: Freada Bergeron, MD;  Location: [place];  Service: Cardiovascular;  Laterality: N/A;   TEE WITHOUT CARDIOVERSION N/A 11/07/2021   Procedure: TRANSESOPHAGEAL ECHOCARDIOGRAM (TEE);  Surgeon: Lajuana Matte, MD;  Location: [place];  Service: Open Heart Surgery;  Laterality: N/A;   TUBAL LIGATION      Current Medications:  Current Outpatient Medications on File Prior to Visit  Medication Sig   amLODipine (NORVASC) 2.5 MG tablet TAKE 1 TABLET BY MOUTH EVERY DAY   Ascorbic Acid (VITAMIN C) 500 MG CAPS Take 500 mg by mouth in the morning.   aspirin EC 81 MG tablet Take 1 tablet (81 mg total) by mouth daily. Swallow whole.   atorvastatin (LIPITOR) 10 MG tablet Take 1 tablet (10 mg total) by mouth every evening.   benzonatate (TESSALON) 100 MG capsule Take 1 capsule (100 mg total) by mouth 3 (three) times daily as needed.   cholecalciferol (VITAMIN D3) 25 MCG (1000 UT) tablet Take 1,000 Units by mouth in the morning.    escitalopram (LEXAPRO) 20 MG tablet TAKE 1 TABLET BY MOUTH EVERY DAY   fluticasone (FLONASE) 50 MCG/ACT nasal spray Place 2 sprays into both nostrils daily.   furosemide (LASIX) 20 MG tablet Take 1 tablet (20 mg total) by mouth daily.   loperamide (IMODIUM) 2 MG capsule Take 1 capsule (2 mg total) by mouth as needed for diarrhea or loose stools.   metoprolol tartrate (LOPRESSOR) 25 MG tablet Take 0.5 tablets (12.5 mg total) by mouth 2 (two) times daily.   Multiple Vitamin (MULTIVITAMIN WITH MINERALS) TABS tablet Take 1 tablet by mouth in the morning.   naproxen (NAPROSYN) 250 MG tablet Take 1 tablet (250 mg total) by mouth 2 (two) times daily as needed.   pantoprazole (PROTONIX) 40 MG tablet Take 1 tablet (40 mg total) by mouth daily.   Potassium (POTASSIMIN PO) Take 1 tablet by mouth daily as needed (with lasix).   tiZANidine (ZANAFLEX) 4 MG tablet Take 1 tablet (4 mg total) by mouth at bedtime.   warfarin (COUMADIN) 5 MG tablet Take 1 to 1 & 1/2 tablets daily or as directed by [facility]   No current facility-administered medications on file prior to visit.     Allergies:   Baclofen, Dilaudid [hydromorphone], Oxycodone hcl, Percocet [oxycodone-acetaminophen], Septra [sulfamethoxazole-trimethoprim], Vibramycin [doxycycline], and Neurontin [gabapentin]   Social History   Tobacco Use   Smoking status: Never   Smokeless tobacco: Never  Vaping Use   Vaping Use: Never used  Substance Use Topics   Alcohol use: No   Drug use: Never    Family History: family history includes Cancer in her father; Diabetes in her mother; Heart disease in her brother, father, and sister; Hypertension in her mother; Parkinson's disease in her father. There is no history of Colon cancer, Rectal cancer, or Stomach cancer.  ROS:   Please see the history of present illness. All other systems are reviewed and negative.   EKGs/Labs/Other Studies Reviewed:    The following studies were reviewed  today:  Echo 12/19/2021:  1. Left ventricular ejection fraction, by estimation, is 50 to 55%. The  left ventricle has low normal function. The left ventricle has no regional  wall motion abnormalities. The left ventricular internal cavity size was  moderately dilated. There is mild   left ventricular hypertrophy. Left ventricular diastolic parameters were  normal.   2. Right ventricular systolic function is mildly reduced. The right  ventricular size is mildly enlarged. There is normal pulmonary artery  systolic pressure. The estimated right ventricular systolic pressure is  [id number] mmHg.   3. The mitral valve is normal in structure. Trivial mitral valve  regurgitation. No evidence of mitral stenosis.   4. A small pericardial effusion is present.   5. The inferior vena cava is dilated in size with <50% respiratory  variability, suggesting right atrial pressure of 15 mmHg.   6. There is a 23 mm On-X valve present in the aortic position      Aortic valve regurgitation is trivial. Vmax 2.3 m/s, MG 11mHg, EOA  1.6 cm^2, DI 0.46   Comparison(s): 11/07/2021 TEE-EF 60-65%.   Pre CABG Carotid Dopplers 11/04/2021: Summary:  Right Carotid: The extracranial vessels were near-normal with only minimal wall thickening or plaque.   Left Carotid: The extracranial vessels were near-normal with only minimal  wall thickening or plaque.   Vertebrals:  Bilateral vertebral arteries demonstrate antegrade flow.  Subclavians: Right subclavian artery flow was disturbed. Normal flow               hemodynamics were seen in the left subclavian artery.   Right Upper Extremity: Doppler waveforms decrease >50% with right radial compression. Doppler waveforms remain within normal limits with right ulnar compression.   Left Upper Extremity: Doppler waveforms remain within normal limits with  left radial compression. Doppler waveforms remain within normal limits  with left ulnar compression.   Echo 11/04/2021:  1.  Left ventricular ejection fraction, by estimation, is 55 to 60%. The  left ventricle has normal function. The left ventricle has no regional  wall motion abnormalities. The left ventricular internal cavity size was  mildly dilated. There is mild  concentric left ventricular hypertrophy. Left ventricular diastolic  parameters were normal.   2. Right ventricular systolic function is normal. The right ventricular  size is normal.   3. Left atrial size was mild to moderately dilated.   4. The mitral valve is grossly normal. Trivial mitral valve  regurgitation. No evidence of mitral stenosis.   5. Aortic valve not well visualized on current images but known to be  bicuspid from prior study. Severe eccentric AR, see image 41, jet fills  LVOT. The aortic valve was not well visualized. There is moderate  calcification of the aortic valve. Aortic  valve regurgitation is severe.   6. The inferior vena cava is normal in size with <50% respiratory  variability, suggesting right atrial pressure of 8 mmHg.   Comparison(s): No significant change from prior study.   Right/Left Heart Cath 10/07/2021:   The left ventricular systolic function is normal.   LV end diastolic pressure is mildly elevated.   There is mild aortic valve stenosis.  Mild aortic valve gradient noted on pullback   No angiographically apparent coronary artery disease.   Ao saturation 96%, PA saturation 71%, PA pressure 35/17, mean PA pressure 25 mmHg; mean pulmonary capillary wedge pressure 17 mmHg; cardiac output 7.02 L/min, cardiac index 3.1.   Wide pulse pressure noted during the case likely related to her aortic insufficiency.  No coronary artery disease.  Continue with plans for management of aortic regurgitation.  Echo TEE 08/31/21 1. The aortic valve is bicuspid with fusion of the RCC and LCC. The  leaflets are asymmetrically thickened most notably at the [facility]let tip  with concern for vegetation/infective endocarditis.  There is flail of one  of the leaflets (suspect NCC) with  lack of central coaptation and resultant severe, highly eccentric aortic  regurgitation.   2. Left ventricular ejection fraction, by estimation, is 65 to 70%. The  left ventricle has normal function.   3. Right ventricular systolic function is normal. The right ventricular  size is normal.   4. No left atrial/left atrial appendage thrombus was detected.   5. The mitral valve is normal in structure. Trivial mitral valve  regurgitation.   6. Aortic dilatation noted. There is borderline dilatation of the aortic  root, measuring 36 mm. There is mild-to-moderate dilatation of the  ascending aorta, measuring 43 mm.   7. The interatrial septum is aneurysmal. There is a PFO demonstrated by  color doppler. Weakly positive agitated saline bubble study.  Comparison(s): Compared to prior TEE on 06/28/21, there continues to be  severe AR with concern for infective endocarditis.   CTA Chest 07/15/2021: COMPARISON:  Chest radiograph 07/14/2021 CT chest 04/22/2020   FINDINGS: Cardiovascular: Good opacification of the central and segmental pulmonary arteries. No focal filling defects. No evidence of significant pulmonary embolus. Normal heart size. No pericardial effusions. Ascending aortic aneurysm measuring 4.1 cm diameter. No aortic dissection. Great vessel origins are patent.   Mediastinum/Nodes: Thyroid gland is unremarkable. Esophagus is decompressed. No significant lymphadenopathy.   Lungs/Pleura: Mild dependent changes in the lung bases. No airspace disease or consolidation. No pleural effusions. No pneumothorax.   Upper Abdomen: Surgical absence of the gallbladder. No acute abnormalities demonstrated in the upper abdomen.   Musculoskeletal: Postoperative changes in the cervical spine and lower thoracic spine. Degenerative changes. No destructive bone lesions.   Review of the MIP images confirms the above findings.    IMPRESSION: 1. No evidence of significant pulmonary embolus. 2. No evidence of active pulmonary disease. 3. 4.1 cm diameter ascending aortic aneurysm. Recommend annual imaging followup by CTA or MRA. This recommendation follows 2010 ACCF/AHA/AATS/ACR/ASA/SCA/SCAI/SIR/STS/SVM Guidelines for the Diagnosis and Management of Patients with Thoracic Aortic Disease. Circulation. 2010; [id number]. Aortic aneurysm NOS (ICD10-I71.9)  Echo TEE 06/28/2021:  1. Bicuspid aortic valve (left and right cusps appear to be fused);  probable vegetation noted; severe AI.   2. Left ventricular ejection fraction, by estimation, is 60 to 65%. The  left ventricle has normal function. The left ventricle has no regional  wall motion abnormalities.   3. Right ventricular systolic function is normal. The right ventricular  size is normal.   4. No left atrial/left atrial appendage thrombus was detected.   5. The mitral valve is normal in structure. No evidence of mitral valve  regurgitation.   6. The aortic valve is bicuspid. Aortic valve regurgitation is severe.  Echo 06/26/2021 1. Poor quality study bad acoustic windows limited.   2. Left ventricular ejection fraction, by estimation, is 55 to 60%. The  left ventricle has normal function. Left ventricular diastolic parameters  are indeterminate.   3. The mitral valve was not well visualized. No evidence of mitral valve  regurgitation.   4. AV not well seen Some calcification and ? mild to moderate AR Cannot  r/o vegetation Suggest TEE if clinically indicated . The aortic valve was  not well visualized.   5. The inferior vena cava is normal in size with greater than 50%  respiratory variability, suggesting right atrial pressure of 3 mmHg.   EKG:  EKG is personally reviewed.   12/14/22: not ordered today 06/22/2022:  NSR at 67 bpm 12/19/2021: EKG was not ordered. 09/20/2021: NSR at 90 bpm 07/26/2021: NSR at 98 bpm  Recent Labs: 07/27/2022: ALT  12 09/21/2022: BUN 13; Creatinine, Ser 1.01; Hemoglobin 11.5; Platelets 323; Potassium 4.6; Sodium 141; TSH 2.670   Recent Lipid Panel    Component Value Date/Time   CHOL 192 07/27/2022 1459   TRIG 172 (H) 07/27/2022 1459   HDL 54 07/27/2022 1459   CHOLHDL 3.6 07/27/2022 1459   CHOLHDL 3.3 11/10/2021 0216   VLDL 14 11/10/2021 0216  LDLCALC 108 (H) 07/27/2022 1459    Physical Exam:    VS:  BP 110/76 (BP Location: Left Arm, Patient Position: Sitting, Cuff Size: Large)   Pulse 85   Ht '5\' 7"'$  (1.702 m)   Wt 279 lb 1.6 oz (126.6 kg)   SpO2 92%   BMI 43.71 kg/m     Wt Readings from Last 3 Encounters:  12/14/22 279 lb 1.6 oz (126.6 kg)  09/21/22 255 lb (115.7 kg)  07/27/22 272 lb 3.2 oz (123.5 kg)    GEN: Well nourished, well developed in no acute distress HEENT: Normal, moist mucous membranes NECK: No JVD CARDIAC: regular rhythm, normal S1 and S2 with mechanical click, no rubs or gallops. No murmur VASCULAR: Radial and DP pulses 2+ bilaterally. No carotid bruits RESPIRATORY:  Clear to auscultation without rales, wheezing or rhonchi  ABDOMEN: Soft, non-tender, non-distended MUSCULOSKELETAL:  Ambulates independently SKIN: Warm and dry, trivial LLE edema NEUROLOGIC:  Alert and oriented x 3. No focal neuro deficits noted. PSYCHIATRIC:  Normal affect    ASSESSMENT:    1. Fatigue, unspecified type   2. Severe aortic regurgitation   3. S/P AVR (aortic valve replacement)   4. Long term (current) use of anticoagulants   5. Hypercoagulable state ([place])     PLAN:    Fatigue -started just before she came down with bronchitis -no clear cardiac symptoms -will order basic labs today -if worsens or fails to improve, recommend she reach out to Dr. [place]  Aortic valve endocarditis Severe aortic regurgitation Bicuspid aortic valve S/P AVR with 30m On-X valve 11/07/21, Dr. LKipp Brood-on aspirin, coumadin -has amoxicillin for dental prophylaxis -most recent echo reviewed  today  Hypertension: -improved post surgery -only needs rare lasix  Morbid obesity: BMI 43 -discussed diet/exercise.  -on medicaid, GLP1RA not currently covered -could [place] if she is interested  Cardiac risk counseling and prevention recommendations: -recommend heart healthy/Mediterranean diet, with whole grains, fruits, vegetable, fish, lean meats, nuts, and olive oil. Limit salt. -recommend moderate walking, 3-5 times/week for 30-50 minutes each session. Aim for at least 150 minutes.week. Goal should be pace of 3 miles/hours, or walking 1.5 miles in 30 minutes -recommend avoidance of tobacco products. Avoid excess alcohol. -ASCVD risk score: The 10-year ASCVD risk score (Arnett DK, et al., 2019) is: 1.4%   Values used to calculate the score:     Age: [age]years     Sex: Female     Is Non-Hispanic African American: No     Diabetic: No     Tobacco smoker: No     Systolic Blood Pressure: 1A999333mmHg     Is BP treated: Yes     HDL Cholesterol: 54 mg/dL     Total Cholesterol: 192 mg/dL    Plan for follow up: 6 months  BBuford Dresser MD, PhD, [facility]    Medication Adjustments/Labs and Tests Ordered: Current medicines are reviewed at length with the patient today.  Concerns regarding medicines are outlined above.   Orders Placed This Encounter  Procedures   TSH   CBC w/Diff   Comp Met (CMET)   Sedimentation rate   C-reactive protein   No orders of the defined types were placed in this encounter.  Patient Instructions  Medication Instructions:  Your physician recommends that you continue on your current medications as directed. Please refer to the Current Medication list given to you today.   *If you need a refill on your cardiac  medications before your next appointment, please call your pharmacy*  Lab Work: TSH/SED RATE/CMET/CBC/CRP TODAY   If you have labs (blood work) drawn today and your tests are completely  normal, you will receive your results only by: [place] (if you have MyChart) OR A paper copy in the mail If you have any lab test that is abnormal or we need to change your treatment, we will call you to review the results.  Testing/Procedures: NONE  Follow-Up: At [facility], you and your health needs are our priority.  As part of our continuing mission to provide you with exceptional heart care, we have created designated Provider Care Teams.  These Care Teams include your primary Cardiologist (physician) and Advanced Practice Providers (APPs -  Physician Assistants and Nurse Practitioners) who all work together to provide you with the care you need, when you need it.  We recommend signing up for the patient portal called "MyChart".  Sign up information is provided on this After Visit Summary.  MyChart is used to connect with patients for Virtual Visits (Telemedicine).  Patients are able to view lab/test results, encounter notes, upcoming appointments, etc.  Non-urgent messages can be sent to your provider as well.   To learn more about what you can do with MyChart, go to [email or web address].    Your next appointment:   6 month(s)  Provider:   DR Erskin Burnet Ford,acting as a scribe for [name], MD.,have documented all relevant documentation on the behalf of [name], MD,as directed by  [name], MD while in the presence of [name], MD.   I, [name], MD, have reviewed all documentation for this visit. The documentation on 12/14/22 for the exam, diagnosis, procedures, and orders are all accurate and complete.   Signed, [name], MD PhD 12/14/2022     [place]

## 2022-12-14 NOTE — Patient Instructions (Addendum)
Medication Instructions:  Your physician recommends that you continue on your current medications as directed. Please refer to the Current Medication list given to you today.   *If you need a refill on your cardiac medications before your next appointment, please call your pharmacy*  Lab Work: TSH/SED RATE/CMET/CBC/CRP TODAY   If you have labs (blood work) drawn today and your tests are completely normal, you will receive your results only by: [place] (if you have MyChart) OR A paper copy in the mail If you have any lab test that is abnormal or we need to change your treatment, we will call you to review the results.  Testing/Procedures: NONE  Follow-Up: At [facility], you and your health needs are our priority.  As part of our continuing mission to provide you with exceptional heart care, we have created designated Provider Care Teams.  These Care Teams include your primary Cardiologist (physician) and Advanced Practice Providers (APPs -  Physician Assistants and Nurse Practitioners) who all work together to provide you with the care you need, when you need it.  We recommend signing up for the patient portal called "MyChart".  Sign up information is provided on this After Visit Summary.  MyChart is used to connect with patients for Virtual Visits (Telemedicine).  Patients are able to view lab/test results, encounter notes, upcoming appointments, etc.  Non-urgent messages can be sent to your provider as well.   To learn more about what you can do with MyChart, go to [email or web address].    Your next appointment:   6 month(s)  Provider:   DR Harrell Gave

## 2022-12-15 LAB — COMPREHENSIVE METABOLIC PANEL
ALT: 17 IU/L (ref 0–32)
AST: 19 IU/L (ref 0–40)
Albumin/Globulin Ratio: 1.4 (ref 1.2–2.2)
Albumin: 3.9 g/dL (ref 3.8–4.9)
Alkaline Phosphatase: 95 IU/L (ref 44–121)
BUN/Creatinine Ratio: 17 (ref 9–23)
BUN: 16 mg/dL (ref 6–24)
Bilirubin Total: 0.3 mg/dL (ref 0.0–1.2)
CO2: 22 mmol/L (ref 20–29)
Calcium: 8.9 mg/dL (ref 8.7–10.2)
Chloride: 105 mmol/L (ref 96–106)
Creatinine, Ser: 0.95 mg/dL (ref 0.57–1.00)
Globulin, Total: 2.7 g/dL (ref 1.5–4.5)
Glucose: 97 mg/dL (ref 70–99)
Potassium: 4.6 mmol/L (ref 3.5–5.2)
Sodium: 144 mmol/L (ref 134–144)
Total Protein: 6.6 g/dL (ref 6.0–8.5)
eGFR: 72 mL/min/{1.73_m2} (ref >59)

## 2022-12-15 LAB — CBC WITH DIFFERENTIAL/PLATELET
Basophils Absolute: 0.1 10*3/uL (ref 0.0–0.2)
Basos: 1 %
EOS (ABSOLUTE): 0.3 10*3/uL (ref 0.0–0.4)
Eos: 3 %
Hematocrit: 39.8 % (ref 34.0–46.6)
Hemoglobin: 11.8 g/dL (ref 11.1–15.9)
Immature Grans (Abs): 0 10*3/uL (ref 0.0–0.1)
Immature Granulocytes: 0 %
Lymphocytes Absolute: 3.4 10*3/uL — ABNORMAL HIGH (ref 0.7–3.1)
Lymphs: 32 %
MCH: 22.4 pg — ABNORMAL LOW (ref 26.6–33.0)
MCHC: 29.6 g/dL — ABNORMAL LOW (ref 31.5–35.7)
MCV: 76 fL — ABNORMAL LOW (ref 79–97)
Monocytes Absolute: 0.8 10*3/uL (ref 0.1–0.9)
Monocytes: 7 %
Neutrophils Absolute: 6.2 10*3/uL (ref 1.4–7.0)
Neutrophils: 57 %
Platelets: 386 10*3/uL (ref 150–450)
RBC: 5.27 x10E6/uL (ref 3.77–5.28)
RDW: 15.1 % (ref 11.7–15.4)
WBC: 10.8 10*3/uL (ref 3.4–10.8)

## 2022-12-15 LAB — TSH: TSH: 2.55 u[IU]/mL (ref 0.450–4.500)

## 2022-12-15 LAB — C-REACTIVE PROTEIN: CRP: 5 mg/L (ref 0–10)

## 2022-12-15 LAB — SEDIMENTATION RATE: Sed Rate: 18 mm/hr (ref 0–40)

## 2022-12-16 ENCOUNTER — Other Ambulatory Visit (HOSPITAL_BASED_OUTPATIENT_CLINIC_OR_DEPARTMENT_OTHER): Payer: Self-pay | Admitting: Family Medicine

## 2022-12-16 ENCOUNTER — Other Ambulatory Visit (HOSPITAL_BASED_OUTPATIENT_CLINIC_OR_DEPARTMENT_OTHER): Payer: Self-pay | Admitting: Cardiology

## 2022-12-16 DIAGNOSIS — Q231 Congenital insufficiency of aortic valve: Secondary | ICD-10-CM

## 2022-12-18 NOTE — Telephone Encounter (Signed)
Warfarin 5mg  refill S/P AVR (aortic valve replacement)  Last INR 12/14/22 Last OV 12/14/22

## 2022-12-18 NOTE — Telephone Encounter (Signed)
Please review for refill. Thank you! 

## 2022-12-19 ENCOUNTER — Encounter (HOSPITAL_BASED_OUTPATIENT_CLINIC_OR_DEPARTMENT_OTHER): Payer: Self-pay

## 2022-12-20 ENCOUNTER — Encounter (HOSPITAL_BASED_OUTPATIENT_CLINIC_OR_DEPARTMENT_OTHER): Payer: Self-pay

## 2022-12-21 NOTE — Telephone Encounter (Signed)
Please advise, patient is on aspirin and warfarin?

## 2022-12-26 ENCOUNTER — Telehealth: Payer: Self-pay

## 2022-12-27 NOTE — Telephone Encounter (Signed)
Patient is going to reschedule her appt for tomorrow with you and will discuss everything with her cardiologist first.

## 2022-12-27 NOTE — Telephone Encounter (Signed)
She should get clearance and stop it for 3-5 days before procedure

## 2022-12-28 ENCOUNTER — Encounter: Payer: Self-pay | Admitting: Podiatry

## 2022-12-28 ENCOUNTER — Ambulatory Visit (INDEPENDENT_AMBULATORY_CARE_PROVIDER_SITE_OTHER): Payer: Medicaid Other | Admitting: Podiatry

## 2022-12-28 DIAGNOSIS — L600 Ingrowing nail: Secondary | ICD-10-CM | POA: Diagnosis not present

## 2022-12-28 NOTE — Patient Instructions (Signed)

## 2022-12-28 NOTE — Progress Notes (Signed)
Subjective:   Patient ID: Mary Chambers, female   DOB: 52 y.o.   MRN: [id number]   HPI Patient presents stating she needs to have her right big toenail removed and she stopped her blood thinner several days ago.  States the other ones are healing fine but they did bleed the first day so she did stop several days ago    ROS      Objective:  Physical Exam  Neurovascular status intact thickened incurvated right big toenail painful when pressed from a dorsal direction with no redness or drainage associated with it.  Patient is found to have good digital perfusion well oriented x 3 and the other nails have healed well     Assessment:  Chronic ingrown toenail deformity right hallux with thickness and pain     Plan:  H&P reviewed went ahead and recommended nail removal explained procedure risk and patient wants surgery.  She signed consent form understanding risk and today I infiltrated the right hallux 60 mg Xylocaine Marcaine mixture sterile prep done using sterile instrumentation removed the hallux nail exposed matrix applied phenol 5 applications 30 seconds followed by alcohol lavage sterile dressing applied significant compression dressing instructed on elevation leave dressing on 24 hours take it off earlier if throbbing were to occur and come in as needed

## 2022-12-31 ENCOUNTER — Telehealth: Payer: Medicaid Other | Admitting: Physician Assistant

## 2022-12-31 DIAGNOSIS — B9689 Other specified bacterial agents as the cause of diseases classified elsewhere: Secondary | ICD-10-CM | POA: Diagnosis not present

## 2022-12-31 DIAGNOSIS — J208 Acute bronchitis due to other specified organisms: Secondary | ICD-10-CM

## 2022-12-31 MED ORDER — AZITHROMYCIN 250 MG PO TABS: ORAL_TABLET | ORAL | 0 refills | Status: AC

## 2022-12-31 MED ORDER — BENZONATATE 100 MG PO CAPS: 100.0000 mg | ORAL_CAPSULE | Freq: Three times a day (TID) | ORAL | 0 refills | Status: DC | PRN

## 2022-12-31 NOTE — Progress Notes (Signed)
E-Visit for Cough  We are sorry that you are not feeling well.  Here is how we plan to help!  Based on your presentation I believe you most likely have A cough due to bacteria.  When patients have a fever and a productive cough with a change in color or increased sputum production, we are concerned about bacterial bronchitis.  If left untreated it can progress to pneumonia.  If your symptoms do not improve with your treatment plan it is important that you contact your provider.   I have prescribed Azithromyin 250 mg: two tablets now and then one tablet daily for 4 additonal days    In addition you may use A prescription cough medication called Tessalon Perles 100mg . You may take 1-2 capsules every 8 hours as needed for your cough.  From your responses in the eVisit questionnaire you describe inflammation in the upper respiratory tract which is causing a significant cough.  This is commonly called Bronchitis and has four common causes:   Allergies Viral Infections Acid Reflux Bacterial Infection Allergies, viruses and acid reflux are treated by controlling symptoms or eliminating the cause. An example might be a cough caused by taking certain blood pressure medications. You stop the cough by changing the medication. Another example might be a cough caused by acid reflux. Controlling the reflux helps control the cough.  USE OF BRONCHODILATOR ("RESCUE") INHALERS: There is a risk from using your bronchodilator too frequently.  The risk is that over-reliance on a medication which only relaxes the muscles surrounding the breathing tubes can reduce the effectiveness of medications prescribed to reduce swelling and congestion of the tubes themselves.  Although you feel brief relief from the bronchodilator inhaler, your asthma may actually be worsening with the tubes becoming more swollen and filled with mucus.  This can delay other crucial treatments, such as oral steroid medications. If you need to use a  bronchodilator inhaler daily, several times per day, you should discuss this with your provider.  There are probably better treatments that could be used to keep your asthma under control.     HOME CARE Only take medications as instructed by your medical team. Complete the entire course of an antibiotic. Drink plenty of fluids and get plenty of rest. Avoid close contacts especially the very young and the elderly Cover your mouth if you cough or cough into your sleeve. Always remember to wash your hands A steam or ultrasonic humidifier can help congestion.   GET HELP RIGHT AWAY IF: You develop worsening fever. You become short of breath You cough up blood. Your symptoms persist after you have completed your treatment plan MAKE SURE YOU  Understand these instructions. Will watch your condition. Will get help right away if you are not doing well or get worse.    Thank you for choosing an e-visit.  Your e-visit answers were reviewed by a board certified advanced clinical practitioner to complete your personal care plan. Depending upon the condition, your plan could have included both over the counter or prescription medications.  Please review your pharmacy choice. Make sure the pharmacy is open so you can pick up prescription now. If there is a problem, you may contact your provider through [employer] and have the prescription routed to another pharmacy.  Your safety is important to [place]. If you have drug allergies check your prescription carefully.   For the next 24 hours you can use MyChart to ask questions about today's visit, request a non-urgent call  back, or ask for a work or school excuse. You will get an email in the next two days asking about your experience. I hope that your e-visit has been valuable and will speed your recovery.  I have spent 5 minutes in review of e-visit questionnaire, review and updating patient chart, medical decision making and response to patient.    Mar Daring, PA-C

## 2023-01-01 ENCOUNTER — Telehealth: Payer: Self-pay | Admitting: *Deleted

## 2023-01-01 NOTE — Telephone Encounter (Signed)
Pt left a voicemail stating she has been prescribed zpak for 5 days. Advised she can take the medication and when she takes the first 2 doses she should have extra leafy veggies since in some cases the INR can go up or sometimes does not reflect. She verbalized understanding and will call back if needed.

## 2023-01-02 ENCOUNTER — Encounter: Payer: Self-pay | Admitting: Podiatry

## 2023-01-02 ENCOUNTER — Other Ambulatory Visit (INDEPENDENT_AMBULATORY_CARE_PROVIDER_SITE_OTHER): Payer: Medicaid Other | Admitting: Podiatry

## 2023-01-02 DIAGNOSIS — L600 Ingrowing nail: Secondary | ICD-10-CM

## 2023-01-02 MED ORDER — AMOXICILLIN-POT CLAVULANATE 875-125 MG PO TABS: 1.0000 | ORAL_TABLET | Freq: Two times a day (BID) | ORAL | 0 refills | Status: AC

## 2023-01-02 NOTE — Progress Notes (Signed)
Rx for augmentin sent re pt concern for infection post ingrown removal procedure

## 2023-01-03 ENCOUNTER — Ambulatory Visit (INDEPENDENT_AMBULATORY_CARE_PROVIDER_SITE_OTHER): Payer: Medicaid Other | Admitting: Podiatry

## 2023-01-03 ENCOUNTER — Encounter: Payer: Self-pay | Admitting: Podiatry

## 2023-01-03 VITALS — BP 140/70 | HR 71

## 2023-01-03 DIAGNOSIS — L600 Ingrowing nail: Secondary | ICD-10-CM

## 2023-01-03 MED ORDER — SILVER SULFADIAZINE 1 % EX CREA: 1.0000 | TOPICAL_CREAM | Freq: Every day | CUTANEOUS | 0 refills | Status: DC

## 2023-01-08 NOTE — Progress Notes (Signed)
  Subjective:  Patient ID: Mary Chambers, female    DOB: [birth year],  MRN: [id number]  Chief Complaint  Patient presents with   Ingrown Toenail    "I have an infected toenail.  He removed it last Thursday."    52 y.o. female presents with the above complaint. History confirmed with patient.   Objective:  Physical Exam: warm, good capillary refill, no trophic changes or ulcerative lesions, normal DP and PT pulses, normal sensory exam, and matricectomy site with slight erythema at the edges of the nailbed, no evidence of infection or cellulitis no purulence no malodor  Assessment:   1. Ingrown nail      Plan:  Patient was evaluated and treated and all questions answered.  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  I recommended using Silvadene ointment and Rx was sent to pharmacy.  Continue soaking for another 2 weeks.  Return.  No follow-ups on file.

## 2023-01-10 ENCOUNTER — Encounter (HOSPITAL_BASED_OUTPATIENT_CLINIC_OR_DEPARTMENT_OTHER): Payer: Self-pay

## 2023-01-11 ENCOUNTER — Ambulatory Visit: Payer: Medicaid Other | Attending: Cardiology | Admitting: *Deleted

## 2023-01-11 ENCOUNTER — Ambulatory Visit: Payer: Medicaid Other

## 2023-01-11 DIAGNOSIS — Z952 Presence of prosthetic heart valve: Secondary | ICD-10-CM

## 2023-01-11 DIAGNOSIS — Z7901 Long term (current) use of anticoagulants: Secondary | ICD-10-CM

## 2023-01-11 DIAGNOSIS — I351 Nonrheumatic aortic (valve) insufficiency: Secondary | ICD-10-CM

## 2023-01-11 LAB — POCT INR: INR: 1.8 — AB (ref 2.0–3.0)

## 2023-01-11 NOTE — Patient Instructions (Addendum)
Description   Today take 1.5 tablets of warfarin then continue taking Warfarin 1 tablet (5mg ) daily except 1/2 tablet (2.5mg ) on Mondays.  Keep the number of servings of green leafy vegetables consistent in your diet.  Recheck in 3 weeks.  [facility] [phone] call with medication changes and or bleeding.

## 2023-01-12 MED ORDER — SEMAGLUTIDE-WEIGHT MANAGEMENT 0.25 MG/0.5ML ~~LOC~~ SOAJ: 0.2500 mg | SUBCUTANEOUS | 0 refills | Status: AC

## 2023-01-12 MED ORDER — SEMAGLUTIDE-WEIGHT MANAGEMENT 2.4 MG/0.75ML ~~LOC~~ SOAJ: 2.4000 mg | SUBCUTANEOUS | 0 refills | Status: DC

## 2023-01-12 MED ORDER — SEMAGLUTIDE-WEIGHT MANAGEMENT 1 MG/0.5ML ~~LOC~~ SOAJ: 1.0000 mg | SUBCUTANEOUS | 0 refills | Status: DC

## 2023-01-12 MED ORDER — SEMAGLUTIDE-WEIGHT MANAGEMENT 0.5 MG/0.5ML ~~LOC~~ SOAJ: 0.5000 mg | SUBCUTANEOUS | 0 refills | Status: DC

## 2023-01-12 MED ORDER — SEMAGLUTIDE-WEIGHT MANAGEMENT 1.7 MG/0.75ML ~~LOC~~ SOAJ: 1.7000 mg | SUBCUTANEOUS | 0 refills | Status: DC

## 2023-01-12 NOTE — Telephone Encounter (Signed)
Patient started on wegovy will likely need prior aut, thanks!

## 2023-01-12 NOTE — Telephone Encounter (Signed)
Ok with prescribing [id number]

## 2023-01-17 ENCOUNTER — Emergency Department (HOSPITAL_COMMUNITY): Payer: Medicaid Other

## 2023-01-17 ENCOUNTER — Emergency Department (HOSPITAL_COMMUNITY)
Admission: EM | Admit: 2023-01-17 | Discharge: 2023-01-17 | Disposition: A | Discharge status: Home / Self Care | Payer: Medicaid Other | Attending: Emergency Medicine | Admitting: Emergency Medicine

## 2023-01-17 ENCOUNTER — Encounter (HOSPITAL_COMMUNITY): Payer: Self-pay

## 2023-01-17 DIAGNOSIS — M5441 Lumbago with sciatica, right side: Secondary | ICD-10-CM | POA: Diagnosis not present

## 2023-01-17 DIAGNOSIS — Z7982 Long term (current) use of aspirin: Secondary | ICD-10-CM | POA: Diagnosis not present

## 2023-01-17 DIAGNOSIS — Z7901 Long term (current) use of anticoagulants: Secondary | ICD-10-CM | POA: Insufficient documentation

## 2023-01-17 DIAGNOSIS — M545 Low back pain, unspecified: Secondary | ICD-10-CM | POA: Diagnosis present

## 2023-01-17 LAB — COMPREHENSIVE METABOLIC PANEL
ALT: 13 U/L (ref 0–44)
AST: 17 U/L (ref 15–41)
Albumin: 3.5 g/dL (ref 3.5–5.0)
Alkaline Phosphatase: 76 U/L (ref 38–126)
Anion gap: 9 (ref 5–15)
BUN: 15 mg/dL (ref 6–20)
CO2: 25 mmol/L (ref 22–32)
Calcium: 9 mg/dL (ref 8.9–10.3)
Chloride: 102 mmol/L (ref 98–111)
Creatinine, Ser: 1.05 mg/dL — ABNORMAL HIGH (ref 0.44–1.00)
GFR, Estimated: >60 mL/min (ref >60)
Glucose, Bld: 84 mg/dL (ref 70–99)
Potassium: 4.3 mmol/L (ref 3.5–5.1)
Sodium: 136 mmol/L (ref 135–145)
Total Bilirubin: 0.5 mg/dL (ref 0.3–1.2)
Total Protein: 6.5 g/dL (ref 6.5–8.1)

## 2023-01-17 LAB — CBC WITH DIFFERENTIAL/PLATELET
Abs Immature Granulocytes: 0.02 10*3/uL (ref 0.00–0.07)
Basophils Absolute: 0.1 10*3/uL (ref 0.0–0.1)
Basophils Relative: 1 %
Eosinophils Absolute: 0.4 10*3/uL (ref 0.0–0.5)
Eosinophils Relative: 5 %
HCT: 37.5 % (ref 36.0–46.0)
Hemoglobin: 10.9 g/dL — ABNORMAL LOW (ref 12.0–15.0)
Immature Granulocytes: 0 %
Lymphocytes Relative: 38 %
Lymphs Abs: 3.1 10*3/uL (ref 0.7–4.0)
MCH: 22.6 pg — ABNORMAL LOW (ref 26.0–34.0)
MCHC: 29.1 g/dL — ABNORMAL LOW (ref 30.0–36.0)
MCV: 77.8 fL — ABNORMAL LOW (ref 80.0–100.0)
Monocytes Absolute: 0.6 10*3/uL (ref 0.1–1.0)
Monocytes Relative: 7 %
Neutro Abs: 3.9 10*3/uL (ref 1.7–7.7)
Neutrophils Relative %: 49 %
Platelets: 349 10*3/uL (ref 150–400)
RBC: 4.82 MIL/uL (ref 3.87–5.11)
RDW: 17 % — ABNORMAL HIGH (ref 11.5–15.5)
WBC: 8.1 10*3/uL (ref 4.0–10.5)
nRBC: 0 % (ref 0.0–0.2)

## 2023-01-17 LAB — PROTIME-INR
INR: 2.1 — ABNORMAL HIGH (ref 0.8–1.2)
Prothrombin Time: 23.7 seconds — ABNORMAL HIGH (ref 11.4–15.2)

## 2023-01-17 LAB — URINALYSIS, ROUTINE W REFLEX MICROSCOPIC
Bilirubin Urine: NEGATIVE
Glucose, UA: NEGATIVE mg/dL
Hgb urine dipstick: NEGATIVE
Ketones, ur: NEGATIVE mg/dL
Leukocytes,Ua: NEGATIVE
Nitrite: NEGATIVE
Protein, ur: NEGATIVE mg/dL
Specific Gravity, Urine: 1.005 (ref 1.005–1.030)
pH: 5 (ref 5.0–8.0)

## 2023-01-17 LAB — LIPASE, BLOOD: Lipase: 37 U/L (ref 11–51)

## 2023-01-17 MED ORDER — LIDOCAINE 5 % EX PTCH
1.0000 | MEDICATED_PATCH | CUTANEOUS | Status: DC
  Administered 2023-01-17: 1 via TRANSDERMAL
  Filled 2023-01-17: qty 1

## 2023-01-17 MED ORDER — DIAZEPAM 5 MG/ML IJ SOLN
5.0000 mg | Freq: Once | INTRAMUSCULAR | Status: AC
  Administered 2023-01-17: 5 mg via INTRAVENOUS
  Filled 2023-01-17: qty 2

## 2023-01-17 MED ORDER — MORPHINE SULFATE (PF) 4 MG/ML IV SOLN
4.0000 mg | Freq: Once | INTRAVENOUS | Status: AC
  Administered 2023-01-17: 4 mg via INTRAVENOUS
  Filled 2023-01-17: qty 1

## 2023-01-17 MED ORDER — METHYLPREDNISOLONE 4 MG PO TBPK: ORAL_TABLET | ORAL | 0 refills | Status: DC

## 2023-01-17 MED ORDER — GADOBUTROL 1 MMOL/ML IV SOLN
10.0000 mL | Freq: Once | INTRAVENOUS | Status: AC | PRN
  Administered 2023-01-17: 10 mL via INTRAVENOUS

## 2023-01-17 NOTE — ED Notes (Signed)
Pt came back from MRI and walked to the bathroom this time. Came back and states her chest is hurting so bad. Hx of valve replacement. Provider made aware. EKG done. Cardiac monitoring in place.

## 2023-01-17 NOTE — ED Triage Notes (Signed)
Pt c/o R Lower back, worse with walking; denies urinary symptoms; states when she holds her right leg up it drifts; endorses generalized weakness; denies fevers, denies cough; denies known injury; symptoms started 2 days ago; hx mechanical heart valve

## 2023-01-17 NOTE — ED Provider Triage Note (Signed)
Emergency Medicine Provider Triage EvaluatiPETRONELLA Chambers Mary Chambers , a 52 y.o. female  was evaluated in triage.  Pt complains of right flank pain.  Started 2 days ago.  Pain is located in the right flank and upper right buttock at times it shoots down the right leg only.  Also endorses weakness in that right leg as well.  Denies urinary changes.  Pain pain progressively worsening making it more difficult to ambulate which prompted her evaluation today.  Review of Systems  Positive: See above Negative: See above  Physical Exam  BP (!) 147/74   Pulse 67   Temp 98.7 F (37.1 C) (Oral)   Resp 18   SpO2 98%  Gen:   Awake, no distress   Resp:  Normal effort  MSK:   Moves extremities without difficulty  Other:  TTP to right flank and right sciatic notch  Medical Decision Making  Medically screening exam initiated at 12:33 PM.  Appropriate orders placed.  Mary Chambers was informed that the remainder of the evaluation will be completed by another provider, this initial triage assessment does not replace that evaluation, and the importance of remaining in the ED until their evaluation is complete.    Gareth Eagle, PA-C 01/17/23 1235

## 2023-01-17 NOTE — ED Provider Notes (Signed)
[place] EMERGENCY DEPARTMENT AT [facility] Provider Note   CSN: [id number] Arrival date & time: 01/17/23  1200     History  No chief complaint on file.   Mary Chambers is a 52 y.o. female.  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  This began 2 to 3 days ago and has progressively gotten worse.  She states it is painful to sleep on the side and to move.  She does have occasional shooting pains down the back of her right leg.  She has had lumbar radiculopathy symptoms for the past 2 to 3 years, however states this feels slightly different than this type of pain.  She reports being admitted for an abscess of her low back and sepsis approximately 1 year ago and states it feels similar to this.  She denies fevers, chills, nausea, vomiting, saddle paresthesias, urinary or fecal incontinence, changes to urinary or bowel habits.  Denies sensation of a mass, rash to her back, drainage.  Also concerned that this may be a kidney stone as she has a history of this.  She denies recent trauma or falls.  HPI     Home Medications Prior to Admission medications   Medication Sig Start Date End Date Taking? Authorizing Provider  methylPREDNISolone (MEDROL DOSEPAK) 4 MG TBPK tablet Use as directed on packaging 01/17/23  Yes Malay Fantroy, Edsel Petrin, PA-C  amLODipine (NORVASC) 2.5 MG tablet TAKE 1 TABLET BY MOUTH EVERY DAY 11/16/22   Jodelle Red, MD  Ascorbic Acid (VITAMIN C) 500 MG CAPS Take 500 mg by mouth in the morning.    [provider]  aspirin EC 81 MG tablet Take 1 tablet (81 mg total) by mouth daily. Swallow whole. 08/15/22   Jodelle Red, MD  atorvastatin (LIPITOR) 10 MG tablet Take 1 tablet (10 mg total) by mouth every evening. 08/15/22   Jodelle Red, MD  benzonatate (TESSALON) 100 MG capsule Take 1 capsule (100  mg total) by mouth 3 (three) times daily as needed. 12/31/22   Margaretann Loveless, PA-C  cholecalciferol (VITAMIN D3) 25 MCG (1000 UT) tablet Take 1,000 Units by mouth in the morning.    [provider]  escitalopram (LEXAPRO) 20 MG tablet TAKE 1 TABLET BY MOUTH EVERY DAY 09/18/22   de [place], Buren Kos, MD  fluticasone [facility]) 50 MCG/ACT nasal spray Place 2 sprays into both nostrils daily. 04/22/22   Daphine Deutscher Mary-Margaret, FNP  furosemide (LASIX) 20 MG tablet Take 1 tablet (20 mg total) by mouth daily. 08/15/22   Jodelle Red, MD  loperamide (IMODIUM) 2 MG capsule Take 1 capsule (2 mg total) by mouth as needed for diarrhea or loose stools. 07/01/21   Rhetta Mura, MD  metoprolol tartrate (LOPRESSOR) 25 MG tablet Take 0.5 tablets (12.5 mg total) by mouth 2 (two) times daily. 08/15/22   Jodelle Red, MD  Multiple Vitamin (MULTIVITAMIN WITH MINERALS) TABS tablet Take 1 tablet by mouth in the morning.    [provider]  naproxen (NAPROSYN) 250 MG tablet Take 1 tablet (250 mg total) by mouth 2 (two) times daily as needed. 10/20/22   de [place], Raymond J, MD  pantoprazole (PROTONIX) 40 MG tablet Take 1 tablet (40 mg total) by mouth daily. 08/15/22   Jodelle Red, MD  Potassium (POTASSIMIN PO) Take 1 tablet by mouth daily as needed (with lasix).    [provider]  Semaglutide-Weight Management 0.25 MG/0.5ML SOAJ Inject 0.25 mg into the skin once a week for 28 days. 01/12/23 02/09/23  Alver Sorrow, NP  Semaglutide-Weight Management 0.5 MG/0.5ML SOAJ Inject 0.5 mg into the skin once a week for 28 days. 02/10/23 03/10/23  Alver Sorrow, NP  Semaglutide-Weight Management 1 MG/0.5ML SOAJ Inject 1 mg into the skin once a week for 28 days. 03/11/23 04/08/23  Alver Sorrow, NP  Semaglutide-Weight Management 1.7 MG/0.75ML SOAJ Inject 1.7 mg into the skin once a week for 28 days. 04/09/23 05/07/23  Alver Sorrow, NP  Semaglutide-Weight Management 2.4  MG/0.75ML SOAJ Inject 2.4 mg into the skin once a week for 28 days. 05/08/23 06/05/23  Alver Sorrow, NP  silver sulfADIAZINE (SILVADENE) 1 % cream Apply 1 Application topically daily. 01/03/23   Edwin Cap, DPM  tiZANidine (ZANAFLEX) 4 MG tablet TAKE 1 TABLET BY MOUTH AT BEDTIME. 12/18/22   de [place], Buren Kos, MD  warfarin (COUMADIN) 5 MG tablet TAKE 1 TO 1 & 1/2 TABLETS DAILY OR AS DIRECTED BY COUMADIN CLINIC 12/18/22   Jodelle Red, MD      Allergies    Baclofen, Dilaudid [hydromorphone], Oxycodone hcl, Percocet [oxycodone-acetaminophen], Septra [sulfamethoxazole-trimethoprim], Vibramycin [doxycycline], and Neurontin [gabapentin]    Review of Systems   Review of Systems  Musculoskeletal:  Positive for back pain.  All other systems reviewed and are negative.   Physical Exam Updated Vital Signs BP 137/70 (BP Location: Right Arm)   Pulse 75   Temp 98 F (36.7 C) (Oral)   Resp 15   SpO2 99%  Physical Exam Vitals and nursing note reviewed.  Constitutional:      General: She is not in acute distress.    Appearance: Normal appearance. She is well-developed. She is not ill-appearing, toxic-appearing or diaphoretic.     Comments: Resting comfortably in bed  HENT:     Head: Normocephalic and atraumatic.  Eyes:     Conjunctiva/sclera: Conjunctivae normal.  Cardiovascular:     Rate and Rhythm: Normal rate and regular rhythm.     Heart sounds: No murmur heard. Pulmonary:     Effort: Pulmonary effort is normal. No respiratory distress.     Breath sounds: Normal breath sounds.  Abdominal:     Palpations: Abdomen is soft.     Tenderness: There is no abdominal tenderness.  Musculoskeletal:        General: No swelling or tenderness.     Cervical back: Neck supple.     Comments: Positive straight leg raise on the right.  Negative on the left.  Mild TTP to the right flank.  No masses, crepitus, lesions, wounds, erythema, warmth.  Sensation intact in bilateral lower  extremities.  Patient is able to move legs without difficulty.  Skin:    General: Skin is warm and dry.     Capillary Refill: Capillary refill takes less than 2 seconds.     Findings: No rash.  Neurological:     Mental Status: She is alert.  Psychiatric:        Mood and Affect: Mood normal.     ED Results / Procedures / Treatments   Labs (all labs ordered are listed, but only abnormal results are displayed) Labs Reviewed  CBC WITH DIFFERENTIAL/PLATELET - Abnormal; Notable for the following components:      Result Value   Hemoglobin 10.9 (*)    MCV 77.8 (*)    MCH 22.6 (*)    MCHC 29.1 (*)  RDW 17.0 (*)    All other components within normal limits  COMPREHENSIVE METABOLIC PANEL - Abnormal; Notable for the following components:   Creatinine, Ser 1.05 (*)    All other components within normal limits  URINALYSIS, ROUTINE W REFLEX MICROSCOPIC - Abnormal; Notable for the following components:   Color, Urine STRAW (*)    All other components within normal limits  PROTIME-INR - Abnormal; Notable for the following components:   Prothrombin Time 23.7 (*)    INR 2.1 (*)    All other components within normal limits  LIPASE, BLOOD    EKG EKG Interpretation  Date/Time:  Wednesday January 17 2023 17:35:28 EDT Ventricular Rate:  60 PR Interval:  135 QRS Duration: 95 QT Interval:  421 QTC Calculation: 421 R Axis:   2 Text Interpretation: Sinus rhythm Low voltage, precordial leads no acute ST/T changes Confirmed by Pricilla Loveless [phone]) on 01/17/2023 5:55:32 PM  Radiology MR Lumbar Spine W Wo Contrast  Result Date: 01/17/2023 CLINICAL DATA:  Back pain.  Infection suspected. EXAM: MRI LUMBAR SPINE WITHOUT AND WITH CONTRAST TECHNIQUE: Multiplanar and multiecho pulse sequences of the lumbar spine were obtained without and with intravenous contrast. CONTRAST:  10mL GADAVIST GADOBUTROL 1 MMOL/ML IV SOLN COMPARISON:  CT scan, same date. FINDINGS: Segmentation: There are five lumbar type  vertebral bodies. The last full intervertebral disc space is labeled L5-S1. Alignment: Degenerative lumbar spondylosis with degenerative anterolisthesis of L2 and L3. Vertebrae:  Normal marrow signal.  No bone lesions or fractures. Conus medullaris and cauda equina: Conus extends to the T12-L1 level. Conus and cauda equina appear normal. Paraspinal and other soft tissues: No significant paraspinal or retroperitoneal findings. Disc levels: Stable surgical changes with fusion hardware at L1-2. Prior hardware removal at L3 and L4. Lower thoracic spinal hardware is also noted. T12-L1: Left paracentral disc protrusion with mild mass effect on the left side of the thecal sac but no significant neural compression. No foraminal stenosis. L1-2: Posterior fusion changes. Wide decompressive laminectomy. No spinal or foraminal stenosis. L2-3: Wide decompressive laminectomy. No significant spinal foraminal stenosis. L3-4: Bulging uncovered disc but wide decompressive laminectomy. No spinal or foraminal stenosis. L4-5: Bulging uncovered disc and facet disease contributing to significant spinal bilateral lateral recess stenosis, left greater than right. Mild bilateral foraminal stenosis also. L5-S1: Advanced facet disease but no disc protrusions, spinal or foraminal stenosis. IMPRESSION: 1. No MR findings to suggest lumbar discitis, osteomyelitis or epidural abscess. 2. Left paracentral disc protrusion at T12-L1 with mild mass effect on the left side of the thecal sac but no significant neural compression. 3. Wide decompressive laminectomies at L1-2, L2-3 and L3-4. No spinal or foraminal stenosis at these levels. 4. Significant multifactorial spinal and bilateral lateral recess stenosis, left greater than right at L4-5. Electronically Signed   By: Rudie Meyer M.D.   On: 01/17/2023 16:54   CT L-SPINE NO CHARGE  Result Date: 01/17/2023 CLINICAL DATA:  Back pain and weakness. History of previous surgery. EXAM: CT LUMBAR SPINE  WITHOUT CONTRAST TECHNIQUE: Multidetector CT imaging of the lumbar spine was performed without intravenous contrast administration. Multiplanar CT image reconstructions were also generated. RADIATION DOSE REDUCTION: This exam was performed according to the departmental dose-optimization program which includes automated exposure control, adjustment of the mA and/or kV according to patient size and/or use of iterative reconstruction technique. COMPARISON:  Lumbar radiographs 12/26/2021 and lumbar spine MRI 10/24/2021 FINDINGS: Segmentation: There are five lumbar type vertebral bodies. The last full intervertebral disc space is labeled L5-S1.  Alignment: Stable moderate left convex lumbar scoliosis. Moderate degenerative anterolisthesis of L2 and L3. Vertebrae: Lower thoracic spinal hardware noted in T11. There are also pedicle screws and posterior rods at L1-2. Right L2 pedicle screw is fractured. No bone lesions or bone fractures. Evidence of prior hardware removal at L3 and L4. Posterior osseous fusion noted at these levels. Paraspinal and other soft tissues: Small scarred left kidney. Renal calculi. Scattered aortic calcifications but no aneurysm. No retroperitoneal mass or adenopathy. Disc levels: T12-L1: Posterior osteophytic spurring with mild left lateral recess stenosis but no significant spinal or foraminal stenosis. L1-2: Wide decompressive laminectomy with posterior fusion hardware. The right L2 pedicle screw is fractured. Posterior osteophytic spurring is noted but no significant spinal or foraminal stenosis. L2-3: Wide decompressive laminectomy and posterior osseous fusion. No obvious spinal or foraminal stenosis. L3-4: Wide decompressive laminectomy and posterior osseous fusion. Bulging and slightly uncovered disc with mild left lateral recess encroachment but no significant spinal or foraminal stenosis. L4-5: Broad-based bulging annulus, advanced facet disease and ligamentum flavum thickening  contributing to moderately severe to severe spinal and bilateral lateral recess stenosis. There is also bilateral foraminal stenosis, left greater than right. L5-S1: Moderate to advanced facet disease but no disc protrusions, spinal or foraminal stenosis. IMPRESSION: 1. Postoperative changes as detailed above. Fusion hardware L1-2 and evidence of prior hardware removal at L3 and L4. The right L2 pedicle screw is fractured. 2. Moderately severe to severe spinal and bilateral lateral recess stenosis at L4-5. There is also bilateral foraminal stenosis, left greater than right. 3. Aortic atherosclerosis. Aortic Atherosclerosis (ICD10-I70.0). Electronically Signed   By: Rudie Meyer M.D.   On: 01/17/2023 13:48   CT RENAL STONE STUDY  Result Date: 01/17/2023 CLINICAL DATA:  Flank pain EXAM: CT ABDOMEN AND PELVIS WITHOUT CONTRAST TECHNIQUE: Multidetector CT imaging of the abdomen and pelvis was performed following the standard protocol without IV contrast. RADIATION DOSE REDUCTION: This exam was performed according to the departmental dose-optimization program which includes automated exposure control, adjustment of the mA and/or kV according to patient size and/or use of iterative reconstruction technique. COMPARISON:  CT 06/22/2021 FINDINGS: Lower chest: There is some linear opacity lung bases likely scar or atelectasis. No pleural effusion. Sternal wires. Prosthetic aortic valve. Hepatobiliary: On this non IV contrast exam, the liver is grossly preserved. Previous cholecystectomy. Pancreas: Unremarkable. No pancreatic ductal dilatation or surrounding inflammatory changes. Spleen: Normal in size without focal abnormality. Adrenals/Urinary Tract: The adrenal glands are preserved. There is moderate atrophy of the left kidney. There are 2 punctate upper pole left-sided renal stones. No right-sided renal stones. Lobular appearance of the right kidney. No ureteral stones. Preserved contours of the urinary bladder.  Stomach/Bowel: Stomach is collapsed. Small bowel is nondilated. Moderate diffuse colonic stool. Large bowel has a normal course and caliber. Normal appendix. This extends superior from the cecum in the central pelvis. Vascular/Lymphatic: Normal caliber aorta and IVC with scattered vascular calcifications. No specific abnormal lymph node enlargement identified in the abdomen and pelvis. Reproductive: Status post hysterectomy. No adnexal masses. Other: Small fat containing umbilical hernia. No free air or free fluid. Musculoskeletal: There is streak artifact related to the patient's spinal fixation hardware. Please see separate dictation of the lumbar spine CT scan from same day. IMPRESSION: Moderate atrophy of the left kidney with 2 punctate upper pole left-sided renal stones. No ureteral stones. Nonspecific bowel gas pattern with scattered stool. Normal appendix. Previous cholecystectomy Electronically Signed   By: Karen Kays M.D.   On:  01/17/2023 13:42    Procedures Procedures    Medications Ordered in ED Medications  lidocaine (LIDODERM) 5 % 1 patch (1 patch Transdermal Patch Applied 01/17/23 1557)  morphine (PF) 4 MG/ML injection 4 mg (4 mg Intravenous Given 01/17/23 1558)  diazepam (VALIUM) injection 5 mg (5 mg Intravenous Given 01/17/23 1601)  gadobutrol (GADAVIST) 1 MMOL/ML injection 10 mL (10 mLs Intravenous Contrast Given 01/17/23 1619)    ED Course/ Medical Decision Making/ A&P                             Medical Decision Making This patient presents to the ED for concern of right-sided low back pain, this involves an extensive number of treatment options, and is a complaint that carries with it a high risk of complications and morbidity.  The differential diagnosis includes  The emergent differential diagnosis for back pain includes but is not limited to fracture, muscle strain, cauda equina, spinal stenosis. DDD, ankylosing spondylitis, acute ligamentous injury, disk herniation,  spondylolisthesis, Epidural compression syndrome, metastatic cancer, transverse myelitis, vertebral osteomyelitis, diskitis, kidney stone, pyelonephritis, AAA, Perforated ulcer, Retrocecal appendicitis, pancreatitis, bowel obstruction, retroperitoneal hemorrhage or mass, meningitis.   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 Previous records within EMR system ED visit for similar on 10/24/2021  I ordered, reviewed and interpreted labs which include: CMP, CBC, lipase, INR.  No leukocytosis.  Stable anemia with hemoglobin of 10.9.   I ordered imaging studies including CT stone study, L-spine, MRI L-spine I independently visualized and interpreted imaging which showed no acute osseous abnormalities.  No evidence of osteomyelitis, discitis, abscess I agree with the radiologist interpretation  Afebrile, hemodynamically stable.  52 year old female presenting to the ED for evaluation of right-sided low back pain.  She states it feels similar to when she has had a paraspinal abscess in the past.  Pain does radiate down the back of her right leg, however she states it feels different from her typical lumbar radiculopathy.  On exam she does have a positive straight leg raise on the right.  There is minimal TTP to the back.  No overlying rashes or lesions.  No fluctuant masses.  Imaging was overall reassuring.  No evidence of any infectious process on MRI.  She does have significant arthropathy and stenosis of the lumbar region.  Overall believe patient's pain may be secondary to a flareup of her sciatica.  Will be treated with a Medrol Dosepak and encouraged to follow-up with her neurosurgeon within the next week.  While in the ED, she did have a transient episode of substernal chest pain after ambulating to  the bathroom.  EKG was obtained because of this and showed no ischemic changes.  This lasted a few minutes and then resolved spontaneously.  Due to her history, ACS workup was offered however patient declined stating that it did not last very long and she feels back to normal.  She was given strict return precautions and encouraged to come back if her chest pain returns at any point.  Stable at discharge.  At this time there does not appear to be any evidence of an acute emergency medical condition and the patient appears stable for discharge with appropriate outpatient follow up. Diagnosis was discussed with patient who verbalizes understanding of care plan  and is agreeable to discharge. I have discussed return precautions with patient and husband who verbalizes understanding. Patient encouraged to follow-up with their neurosurgeon within one week. All questions answered.  Patient's case discussed with Dr. Criss Alvine who agrees with plan to discharge with follow-up.   Note: Portions of this report may have been transcribed using voice recognition software. Every effort was made to ensure accuracy; however, inadvertent computerized transcription errors may still be present.        Final Clinical Impression(s) / ED Diagnoses Final diagnoses:  Acute right-sided low back pain with right-sided sciatica    Rx / DC Orders ED Discharge Orders          Ordered    methylPREDNISolone (MEDROL DOSEPAK) 4 MG TBPK tablet        01/17/23 1808              Michelle Piper, PA-C 01/17/23 1812    Pricilla Loveless, MD 01/17/23 2252

## 2023-01-17 NOTE — ED Notes (Signed)
Pt to MRI

## 2023-01-17 NOTE — Discharge Instructions (Addendum)
You have been seen today for your complaint of right sided low back pain. Your lab work was overall reassuring. Your imaging was overall reassuring. Your discharge medications include medrol dosepak. Take it as prescribed and for the entire duration of the prescription. Follow up with: Your neurosurgeon in 1 week for reevaluation of your symptoms Please seek immediate medical care if you develop any of the following symptoms: You are not able to control when you urinate or have bowel movements (incontinence). You have: Weakness in your lower back, pelvis, buttocks, or legs that gets worse. Redness or swelling of your back. A burning sensation when you urinate. At this time there does not appear to be the presence of an emergent medical condition, however there is always the potential for conditions to change. Please read and follow the below instructions.  Do not take your medicine if  develop an itchy rash, swelling in your mouth or lips, or difficulty breathing; call 911 and seek immediate emergency medical attention if this occurs.  You may review your lab tests and imaging results in their entirety on your MyChart account.  Please discuss all results of fully with your primary care provider and other specialist at your follow-up visit.  Note: Portions of this text may have been transcribed using voice recognition software. Every effort was made to ensure accuracy; however, inadvertent computerized transcription errors may still be present.

## 2023-01-18 ENCOUNTER — Other Ambulatory Visit (HOSPITAL_COMMUNITY): Payer: Self-pay

## 2023-01-18 ENCOUNTER — Telehealth: Payer: Self-pay

## 2023-01-18 ENCOUNTER — Other Ambulatory Visit (HOSPITAL_BASED_OUTPATIENT_CLINIC_OR_DEPARTMENT_OTHER): Payer: Self-pay | Admitting: Family Medicine

## 2023-01-18 DIAGNOSIS — F419 Anxiety disorder, unspecified: Secondary | ICD-10-CM

## 2023-01-18 NOTE — Telephone Encounter (Addendum)
Pharmacy Patient Advocate Encounter   Received notification from [facility] that prior authorization for [facility] is needed.    PA submitted on 01/18/23 Key [id number] Status is pending  Haze Rushing, CPhT Pharmacy Patient Advocate Specialist Direct Number: [phone] Fax: [phone]

## 2023-01-18 NOTE — Telephone Encounter (Signed)
PA submitted, see separate encounter

## 2023-01-22 ENCOUNTER — Encounter (HOSPITAL_BASED_OUTPATIENT_CLINIC_OR_DEPARTMENT_OTHER): Payer: Self-pay

## 2023-01-22 MED ORDER — AMOXICILLIN 250 MG PO CAPS: ORAL_CAPSULE | ORAL | 2 refills | Status: DC

## 2023-01-22 NOTE — Telephone Encounter (Signed)
Pharmacy Patient Advocate Encounter  Received notification from [facility] that the request for prior authorization for [facility] has been denied due to PLAN EXCLUSION  PLEASE ADVISE   Haze Rushing, CPhT Pharmacy Patient Advocate Specialist Direct Number: [phone] Fax: [phone]

## 2023-01-23 ENCOUNTER — Telehealth: Payer: Medicaid Other | Admitting: Family Medicine

## 2023-01-23 DIAGNOSIS — K625 Hemorrhage of anus and rectum: Secondary | ICD-10-CM

## 2023-01-23 NOTE — Progress Notes (Signed)
Because being on blood thinners and having rectum bleeding you should be assessed in person to make sure this is hemorrhoid and not another source of bleeding, I feel your condition warrants further evaluation and I recommend that you be seen in a face to face visit.   NOTE: There will be NO CHARGE for this eVisit

## 2023-01-24 MED ORDER — AMOXICILLIN 500 MG PO CAPS: ORAL_CAPSULE | ORAL | 3 refills | Status: DC

## 2023-01-24 NOTE — Addendum Note (Signed)
Addended by: Tylene Fantasia on: 01/24/2023 03:38 PM   Modules accepted: Orders

## 2023-01-29 ENCOUNTER — Ambulatory Visit (HOSPITAL_BASED_OUTPATIENT_CLINIC_OR_DEPARTMENT_OTHER): Payer: Medicaid Other | Admitting: Family Medicine

## 2023-01-31 ENCOUNTER — Ambulatory Visit: Payer: Medicaid Other | Admitting: Podiatry

## 2023-01-31 ENCOUNTER — Other Ambulatory Visit (HOSPITAL_BASED_OUTPATIENT_CLINIC_OR_DEPARTMENT_OTHER): Payer: Self-pay

## 2023-01-31 ENCOUNTER — Other Ambulatory Visit (HOSPITAL_BASED_OUTPATIENT_CLINIC_OR_DEPARTMENT_OTHER): Payer: Self-pay | Admitting: Family Medicine

## 2023-01-31 MED ORDER — TIZANIDINE HCL 4 MG PO TABS: 4.0000 mg | ORAL_TABLET | Freq: Every day | ORAL | 3 refills | Status: DC

## 2023-02-01 ENCOUNTER — Ambulatory Visit (HOSPITAL_BASED_OUTPATIENT_CLINIC_OR_DEPARTMENT_OTHER): Payer: Medicaid Other | Admitting: Family Medicine

## 2023-02-01 ENCOUNTER — Telehealth: Payer: Medicaid Other | Admitting: Physician Assistant

## 2023-02-01 ENCOUNTER — Ambulatory Visit: Payer: Medicaid Other | Attending: Cardiology | Admitting: *Deleted

## 2023-02-01 DIAGNOSIS — Z7901 Long term (current) use of anticoagulants: Secondary | ICD-10-CM

## 2023-02-01 DIAGNOSIS — H6991 Unspecified Eustachian tube disorder, right ear: Secondary | ICD-10-CM

## 2023-02-01 DIAGNOSIS — I351 Nonrheumatic aortic (valve) insufficiency: Secondary | ICD-10-CM

## 2023-02-01 DIAGNOSIS — Z952 Presence of prosthetic heart valve: Secondary | ICD-10-CM | POA: Diagnosis not present

## 2023-02-01 LAB — POCT INR: POC INR: 2.2

## 2023-02-01 MED ORDER — IPRATROPIUM BROMIDE 0.03 % NA SOLN
2.0000 | Freq: Two times a day (BID) | NASAL | 0 refills | Status: DC
Start: 2023-02-01 — End: 2023-03-07

## 2023-02-01 NOTE — Progress Notes (Signed)

## 2023-02-01 NOTE — Patient Instructions (Signed)
Description   Continue taking Warfarin 1 tablet (5mg) daily except 1/2 tablet (2.5mg) on Mondays.  Keep the number of servings of green leafy vegetables consistent in your diet.  Recheck in 4 weeks.  Coumadin Clinic [phone] call with medication changes and or bleeding.      

## 2023-02-07 ENCOUNTER — Ambulatory Visit: Payer: Medicaid Other | Admitting: Podiatry

## 2023-02-07 DIAGNOSIS — L600 Ingrowing nail: Secondary | ICD-10-CM | POA: Diagnosis not present

## 2023-02-07 MED ORDER — NEOMYCIN-POLYMYXIN-HC 3.5-10000-1 OT SUSP: OTIC | 0 refills | Status: DC

## 2023-02-07 NOTE — Patient Instructions (Signed)

## 2023-02-11 ENCOUNTER — Encounter (HOSPITAL_BASED_OUTPATIENT_CLINIC_OR_DEPARTMENT_OTHER): Payer: Self-pay | Admitting: Family Medicine

## 2023-02-12 ENCOUNTER — Ambulatory Visit (HOSPITAL_BASED_OUTPATIENT_CLINIC_OR_DEPARTMENT_OTHER): Payer: Medicaid Other | Admitting: Family Medicine

## 2023-02-12 NOTE — Progress Notes (Signed)
  Subjective:  Patient ID: Mary Chambers, female    DOB: 1971-06-19,  MRN: [id number]  Chief Complaint  Patient presents with   Ingrown Toenail    toenail removal on one foot only, pt on blood thinners and may need to stop due to last time had to go to ed after nails removed on both feet.    52 y.o. female presents with the above complaint. History confirmed with patient.  Previous nail removals are doing much better.  She would like to have the fourth and fifth on the right foot removed permanently.  Objective:  Physical Exam: warm, good capillary refill, no trophic changes or ulcerative lesions, normal DP and PT pulses, normal sensory exam, and prior nail avulsion sites are healing well, the fourth and fifth nails are mycotic and dystrophic and painful  Assessment:   1. Ingrown nail      Plan:  Patient was evaluated and treated and all questions answered.  She presented today for removal of ingrown mycotic nail.  Recommended chemical matricectomy.  Following consent and digital block with lidocaine and Marcaine and prepped with Betadine a tourniquet was placed around the base of each toe and the nail plate was avulsed.  3 applications of phenolic acid were applied.  This was irrigated with alcohol and dressed with Silvadene and a bandage.  Cortisporin drops sent to pharmacy.  Post care instructions given.  She will keep the dressing on for 48 to 72 hours due to her blood thinner.  She will let me know if she has any issues.  Return in about 1 month (around 03/10/2023) for nail re-check.

## 2023-02-18 ENCOUNTER — Encounter: Payer: Self-pay | Admitting: Podiatry

## 2023-02-20 MED ORDER — CEPHALEXIN 500 MG PO CAPS: 500.0000 mg | ORAL_CAPSULE | Freq: Three times a day (TID) | ORAL | 0 refills | Status: DC

## 2023-02-22 ENCOUNTER — Telehealth: Payer: Self-pay | Admitting: *Deleted

## 2023-02-22 ENCOUNTER — Encounter (HOSPITAL_BASED_OUTPATIENT_CLINIC_OR_DEPARTMENT_OTHER): Payer: Self-pay

## 2023-02-22 ENCOUNTER — Telehealth: Payer: Self-pay | Admitting: Cardiology

## 2023-02-22 ENCOUNTER — Telehealth: Payer: Self-pay

## 2023-02-22 ENCOUNTER — Ambulatory Visit (HOSPITAL_BASED_OUTPATIENT_CLINIC_OR_DEPARTMENT_OTHER): Payer: Medicaid Other | Admitting: Family Medicine

## 2023-02-22 NOTE — Telephone Encounter (Signed)
Call transferred from call center, advised this would need to be routed to pharmacy or anticoag pool

## 2023-02-22 NOTE — Telephone Encounter (Signed)
   Name: Mary Chambers  DOB: [birth year]  MRN: [id number]  Primary Cardiologist: Jodelle Red, MD   Preoperative team, please contact this patient and set up a phone call appointment for further preoperative risk assessment. Please obtain consent and complete medication review. Thank you for your help.  I confirm that guidance regarding antiplatelet and oral anticoagulation therapy has been completed and, if necessary, noted below.  Per office protocol, patient can hold warfarin for 5 days prior to procedure. Patient will not need bridging with Lovenox (enoxaparin) around procedure.    Napoleon Form, Leodis Rains, NP 02/22/2023, 2:58 PM [facility]

## 2023-02-22 NOTE — Telephone Encounter (Signed)
[facility] is calling to get a verbal for prescription warfarin

## 2023-02-22 NOTE — Telephone Encounter (Signed)
Pt has been added on to pre op tele appt ok per pre op APP Robin Searing, NP. Med rec and consent are done.      Patient Consent for Virtual Visit        Mary Chambers has provided verbal consent on 02/22/2023 for a virtual visit (video or telephone).   CONSENT FOR VIRTUAL VISIT FOR:  Mary Chambers  By participating in this virtual visit I agree to the following:  I hereby voluntarily request, consent and authorize [facility] and its employed or contracted physicians, physician assistants, nurse practitioners or other licensed health care professionals (the Practitioner), to provide me with telemedicine health care services (the "Services") as deemed necessary by the treating Practitioner. I acknowledge and consent to receive the Services by the Practitioner via telemedicine. I understand that the telemedicine visit will involve communicating with the Practitioner through live audiovisual communication technology and the disclosure of certain medical information by electronic transmission. I acknowledge that I have been given the opportunity to request an in-person assessment or other available alternative prior to the telemedicine visit and am voluntarily participating in the telemedicine visit.  I understand that I have the right to withhold or withdraw my consent to the use of telemedicine in the course of my care at any time, without affecting my right to future care or treatment, and that the Practitioner or I may terminate the telemedicine visit at any time. I understand that I have the right to inspect all information obtained and/or recorded in the course of the telemedicine visit and may receive copies of available information for a reasonable fee.  I understand that some of the potential risks of receiving the Services via telemedicine include:  Delay or interruption in medical evaluation due to technological equipment failure or disruption; Information transmitted may  not be sufficient (e.g. poor resolution of images) to allow for appropriate medical decision making by the Practitioner; and/or  In rare instances, security protocols could fail, causing a breach of personal health information.  Furthermore, I acknowledge that it is my responsibility to provide information about my medical history, conditions and care that is complete and accurate to the best of my ability. I acknowledge that Practitioner's advice, recommendations, and/or decision may be based on factors not within their control, such as incomplete or inaccurate data provided by me or distortions of diagnostic images or specimens that may result from electronic transmissions. I understand that the practice of medicine is not an exact science and that Practitioner makes no warranties or guarantees regarding treatment outcomes. I acknowledge that a copy of this consent can be made available to me via my patient portal [facility] MyChart), or I can request a printed copy by calling the office of [facility].    I understand that my insurance will be billed for this visit.   I have read or had this consent read to me. I understand the contents of this consent, which adequately explains the benefits and risks of the Services being provided via telemedicine.  I have been provided ample opportunity to ask questions regarding this consent and the Services and have had my questions answered to my satisfaction. I give my informed consent for the services to be provided through the use of telemedicine in my medical care

## 2023-02-22 NOTE — Telephone Encounter (Signed)
Pt has been added on to pre op tele appt ok per pre op APP Robin Searing, NP. Med rec and consent are done.

## 2023-02-22 NOTE — Telephone Encounter (Signed)
Please advise 

## 2023-02-22 NOTE — Telephone Encounter (Signed)
   Pre-operative Risk Assessment    Patient Name: Mary Chambers  DOB: [birth year] MRN: [id number]      Request for Surgical Clearance    Procedure:   RT L4-5 ESI  Date of Surgery:  Clearance TBD                                 Surgeon:  Dr. Aileen Fass Surgeon's Group or Practice Name:  [facility] Phone number:  [phone] Fax number:  [phone]   Type of Clearance Requested:   - Medical  - Pharmacy:  Hold Aspirin and Warfarin (Coumadin) 5 days prior/resume day after.    Type of Anesthesia:  Not Indicated   Additional requests/questions:  Pt has upcoming appt with Dr.Christopher on June 5. Pre-Op added to upcoming appt notes.   Signed, Emmit Pomfret   02/22/2023, 1:20 PM

## 2023-02-22 NOTE — Telephone Encounter (Addendum)
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    Procedure: lumbar ESI Date of procedure: TBD  CrCl 42mL/min using adj body weight Platelet count 349K  Per office protocol, patient can hold warfarin for 5 days prior to procedure. Patient will not need bridging with Lovenox (enoxaparin) around procedure.   Pt's INR range also needs to be updated. She had On-X AVR on 11/07/2021 - INR range 2-3 for 3 months, then should have dropped to 1.5-2. This was confirmed in 12/15/21 cardiothoracic surgery note. Coumadin clinic - please update this at pt's next INR check.  **This guidance is not considered finalized until pre-operative APP has relayed final recommendations.**

## 2023-02-22 NOTE — Telephone Encounter (Signed)
Pharmacy please advise on holding Coumadin prior to lumbar ESI scheduled for TBD. Thank you.

## 2023-02-22 NOTE — Telephone Encounter (Signed)
Called and spoke to [facility] they are wanting to verify that  Dr. Cristal Deer manages pt's warfarin and need fax number to fax over a clearance request.

## 2023-02-23 ENCOUNTER — Ambulatory Visit: Payer: Medicaid Other | Attending: Cardiovascular Disease

## 2023-02-23 DIAGNOSIS — Z01810 Encounter for preprocedural cardiovascular examination: Secondary | ICD-10-CM

## 2023-02-23 NOTE — Progress Notes (Signed)
Virtual Visit via Telephone Note   Because of Mary Chambers's co-morbid illnesses, she is at least at moderate risk for complications without adequate follow up.  This format is felt to be most appropriate for this patient at this time.  The patient did not have access to video technology/had technical difficulties with video requiring transitioning to audio format only (telephone).  All issues noted in this document were discussed and addressed.  No physical exam could be performed with this format.  Please refer to the patient's chart for her consent to telehealth for [facility].  Evaluation Performed:  Preoperative cardiovascular risk assessment _____________   Date:  02/23/2023   Patient ID:  Mary Chambers, DOB [birth year], MRN [id number] Patient Location:  Home Provider location:   Office  Primary Care Provider:  [place], Buren Kos, MD Primary Cardiologist:  Jodelle Red, MD  Chief Complaint / Patient Profile   51 y.o. y/o female with a h/o aortic valve endocarditis s/p AVR 2023, hypertension, obesity, arthritis, and depression  who is pending lumbar ESI and presents today for telephonic preoperative cardiovascular risk assessment.  History of Present Illness    Mary Chambers is a 52 y.o. female who presents via audio/video conferencing for a telehealth visit today.  Pt was last seen in cardiology clinic on 12/14/2022 by Dr. Cristal Deer.  At that time Mary Chambers was doing well then with lots of fatigue. She recommended to reach out to your PCP if it continued.The patient is now pending procedure as outlined above.Since her last visit, she has been doing well with no new cardiac complaints.  She reports improvement with her fatigue but does note some shortness of breath with exertion.  She experienced an episode of shortness of breath and dizziness while in the shower yesterday.  Her blood pressure was elevated 146/85 and heart rate of 116.  She  waited 5 to 10 minutes and rechecked and heart rate had decreased to 98 with a blood pressure of 128/83.  She reports possibly getting too hot in the shower but denies any associated chest pain or tachycardia with these episodes.  Per office protocol, patient can hold warfarin for 5 days prior to procedure. Patient will not need bridging with Lovenox (enoxaparin) around procedure.   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 ENDOSCOPY;  Service: Cardiovascular;;   CARPAL TUNNEL RELEASE     CHOLECYSTECTOMY     IR FLUORO GUIDED NEEDLE PLC ASPIRATION/INJECTION LOC  06/23/2021   LITHOTRIPSY     neck fusion     RIGHT/LEFT HEART CATH AND CORONARY ANGIOGRAPHY N/A 10/07/2021   Procedure: RIGHT/LEFT HEART CATH AND CORONARY ANGIOGRAPHY;  Surgeon: Corky Crafts, MD;  Location: [facility] INVASIVE CV LAB;  Service: Cardiovascular;  Laterality:  N/A;   TEE WITHOUT CARDIOVERSION N/A 06/28/2021   Procedure: TRANSESOPHAGEAL ECHOCARDIOGRAM (TEE);  Surgeon: Lewayne Bunting, MD;  Location: [facility] ENDOSCOPY;  Service: Cardiovascular;  Laterality: N/A;   TEE WITHOUT CARDIOVERSION N/A 08/31/2021   Procedure: TRANSESOPHAGEAL ECHOCARDIOGRAM (TEE);  Surgeon: Meriam Sprague, MD;  Location: [facility] ENDOSCOPY;  Service: Cardiovascular;  Laterality: N/A;   TEE WITHOUT CARDIOVERSION N/A 11/07/2021   Procedure:  TRANSESOPHAGEAL ECHOCARDIOGRAM (TEE);  Surgeon: Corliss Skains, MD;  Location: [facility] OR;  Service: Open Heart Surgery;  Laterality: N/A;   TUBAL LIGATION      Allergies  Allergies  Allergen Reactions   Baclofen Hives   Dilaudid [Hydromorphone] Itching   Oxycodone Hcl Itching   Percocet [Oxycodone-Acetaminophen] Hives   Septra [Sulfamethoxazole-Trimethoprim] Hives   Vibramycin [Doxycycline] Itching   Neurontin [Gabapentin] Rash    Home Medications    Prior to Admission medications   Medication Sig Start Date End Date Taking? Authorizing Provider  amLODipine (NORVASC) 2.5 MG tablet TAKE 1 TABLET BY MOUTH EVERY DAY 11/16/22   Jodelle Red, MD  amoxicillin (AMOXIL) 500 MG capsule Take 4 capsules (2000 mg) 30-60 min prior to dental work 01/24/23   Jodelle Red, MD  Ascorbic Acid (VITAMIN C) 500 MG CAPS Take 500 mg by mouth in the morning.    [provider]  aspirin EC 81 MG tablet Take 1 tablet (81 mg total) by mouth daily. Swallow whole. 08/15/22   Jodelle Red, MD  atorvastatin (LIPITOR) 10 MG tablet Take 1 tablet (10 mg total) by mouth every evening. 08/15/22   Jodelle Red, MD  benzonatate (TESSALON) 100 MG capsule Take 1 capsule (100 mg total) by mouth 3 (three) times daily as needed. 12/31/22   Margaretann Loveless, PA-C  cephALEXin (KEFLEX) 500 MG capsule Take 1 capsule (500 mg total) by mouth 3 (three) times daily. 02/20/23   McDonald, Rachelle Hora, DPM  cholecalciferol (VITAMIN D3) 25 MCG (1000 UT) tablet Take 1,000 Units by mouth in the morning.    [provider]  escitalopram (LEXAPRO) 20 MG tablet TAKE 1 TABLET BY MOUTH EVERY DAY 01/18/23   Novella Olive, FNP  fluticasone (FLONASE) 50 MCG/ACT nasal spray Place 2 sprays into both nostrils daily. 04/22/22   Daphine Deutscher Mary-Margaret, FNP  furosemide (LASIX) 20 MG tablet Take 1 tablet (20 mg total) by mouth daily. 08/15/22   Jodelle Red, MD  ipratropium (ATROVENT) 0.03 %  nasal spray Place 2 sprays into both nostrils every 12 (twelve) hours. 02/01/23   Margaretann Loveless, PA-C  loperamide (IMODIUM) 2 MG capsule Take 1 capsule (2 mg total) by mouth as needed for diarrhea or loose stools. 07/01/21   Rhetta Mura, MD  methylPREDNISolone (MEDROL DOSEPAK) 4 MG TBPK tablet Use as directed on packaging 01/17/23   Schutt, Edsel Petrin, PA-C  metoprolol tartrate (LOPRESSOR) 25 MG tablet Take 0.5 tablets (12.5 mg total) by mouth 2 (two) times daily. 08/15/22   Jodelle Red, MD  Multiple Vitamin (MULTIVITAMIN WITH MINERALS) TABS tablet Take 1 tablet by mouth in the morning.    [provider]  naproxen (NAPROSYN) 250 MG tablet Take 1 tablet (250 mg total) by mouth 2 (two) times daily as needed. 10/20/22   [place], Buren Kos, MD  neomycin-polymyxin-hydrocortisone (CORTISPORIN) 3.5-10000-1 OTIC suspension Apply 1-2 drops daily after soaking and cover with bandaid 02/07/23   McDonald, Rachelle Hora, DPM  pantoprazole (PROTONIX) 40 MG tablet Take 1 tablet (40 mg total) by mouth daily. 08/15/22   Jodelle Red, MD  Potassium (POTASSIMIN PO) Take 1 tablet by mouth daily as needed (with lasix).    [provider]  Semaglutide-Weight Management 0.5 MG/0.5ML SOAJ Inject 0.5 mg into the skin once a week for 28 days. 02/10/23 03/10/23  Alver Sorrow, NP  Semaglutide-Weight Management 1 MG/0.5ML SOAJ Inject 1 mg into the skin once a week for 28 days. 03/11/23 04/08/23  Alver Sorrow, NP  Semaglutide-Weight Management 1.7 MG/0.75ML SOAJ Inject 1.7 mg into the skin once a week for 28 days. 04/09/23 05/07/23  Alver Sorrow, NP  Semaglutide-Weight Management 2.4 MG/0.75ML SOAJ Inject 2.4 mg into the skin once a week for 28 days. 05/08/23 06/05/23  Alver Sorrow, NP  silver sulfADIAZINE (SILVADENE) 1 % cream Apply 1 Application topically daily. 01/03/23   McDonald, Rachelle Hora, DPM  tiZANidine (ZANAFLEX) 4 MG tablet Take 1 tablet (4 mg total) by mouth at bedtime. 01/31/23    Novella Olive, FNP  warfarin (COUMADIN) 5 MG tablet TAKE 1 TO 1 & 1/2 TABLETS DAILY OR AS DIRECTED BY COUMADIN CLINIC 12/18/22   Jodelle Red, MD    Physical Exam    Vital Signs:  PORCHEA BOEVERS does not have vital signs available for review today.  Given telephonic nature of communication, physical exam is limited. AAOx3. NAD. Normal affect.  Speech and respirations are unlabored.  Accessory Clinical Findings    None  Assessment & Plan    1.  Preoperative Cardiovascular Risk Assessment:  Patient's RCRI score is 0.4%  The patient affirms she has been doing well without any new cardiac symptoms. They are able to achieve 4 METS without cardiac limitations. Therefore, based on ACC/AHA guidelines, the patient would be at acceptable risk for the planned procedure without further cardiovascular testing. The patient was advised that if she develops new symptoms prior to surgery to contact our office to arrange for a follow-up visit, and she verbalized understanding.   The patient was advised that if she develops new symptoms prior to surgery to contact our office to arrange for a follow-up visit, and she verbalized understanding.  Patient advised to hold warfarin 5 days prior to her scheduled procedure and should restart postprocedure when surgically safe.  (Patient does not require bridging).  A copy of this note will be routed to requesting surgeon.  Time:   Today, I have spent 8 minutes with the patient with telehealth technology discussing medical history, symptoms, and management plan.     Napoleon Form, Leodis Rains, NP  02/23/2023, 7:16 AM

## 2023-02-27 ENCOUNTER — Encounter (HOSPITAL_BASED_OUTPATIENT_CLINIC_OR_DEPARTMENT_OTHER): Payer: Self-pay

## 2023-02-27 NOTE — Telephone Encounter (Signed)
Sent to provider also, office awaiting clearance from Friday Telephone visit for patients surgery

## 2023-03-01 ENCOUNTER — Ambulatory Visit: Payer: Medicaid Other | Attending: Cardiology

## 2023-03-01 DIAGNOSIS — Z7901 Long term (current) use of anticoagulants: Secondary | ICD-10-CM

## 2023-03-01 DIAGNOSIS — I351 Nonrheumatic aortic (valve) insufficiency: Secondary | ICD-10-CM

## 2023-03-01 DIAGNOSIS — Z952 Presence of prosthetic heart valve: Secondary | ICD-10-CM

## 2023-03-01 LAB — POCT INR: INR: 1 — AB (ref 2.0–3.0)

## 2023-03-01 NOTE — Patient Instructions (Signed)
Description   Take 2 tablets today and then resume taking Warfarin 1 tablet (5mg ) daily except 1/2 tablet (2.5mg ) on Mondays.  Keep the number of servings of green leafy vegetables consistent in your diet.  Recheck in 1 week.  Coumadin Clinic [phone] call with medication changes and or bleeding.

## 2023-03-03 ENCOUNTER — Encounter (HOSPITAL_BASED_OUTPATIENT_CLINIC_OR_DEPARTMENT_OTHER): Payer: Self-pay

## 2023-03-05 ENCOUNTER — Encounter (HOSPITAL_BASED_OUTPATIENT_CLINIC_OR_DEPARTMENT_OTHER): Payer: Self-pay | Admitting: Family Medicine

## 2023-03-05 ENCOUNTER — Telehealth: Payer: Self-pay

## 2023-03-05 ENCOUNTER — Emergency Department (HOSPITAL_COMMUNITY)
Admission: EM | Admit: 2023-03-05 | Discharge: 2023-03-06 | Discharge status: Against Medical Advice | Payer: Medicaid Other | Attending: Emergency Medicine | Admitting: Emergency Medicine

## 2023-03-05 ENCOUNTER — Encounter (HOSPITAL_COMMUNITY): Payer: Self-pay

## 2023-03-05 ENCOUNTER — Other Ambulatory Visit: Payer: Self-pay

## 2023-03-05 DIAGNOSIS — G43809 Other migraine, not intractable, without status migrainosus: Secondary | ICD-10-CM | POA: Diagnosis not present

## 2023-03-05 DIAGNOSIS — R519 Headache, unspecified: Secondary | ICD-10-CM | POA: Insufficient documentation

## 2023-03-05 DIAGNOSIS — Z7901 Long term (current) use of anticoagulants: Secondary | ICD-10-CM | POA: Insufficient documentation

## 2023-03-05 DIAGNOSIS — Z952 Presence of prosthetic heart valve: Secondary | ICD-10-CM | POA: Insufficient documentation

## 2023-03-05 DIAGNOSIS — Z5321 Procedure and treatment not carried out due to patient leaving prior to being seen by health care provider: Secondary | ICD-10-CM | POA: Insufficient documentation

## 2023-03-05 NOTE — Telephone Encounter (Signed)
I spoke to patient and advised to follow Mary Chambers suggestion to take Tylenol and reach out to PCP for further advisement.  She was awaiting PCP call back.

## 2023-03-05 NOTE — Telephone Encounter (Signed)
Please advise 

## 2023-03-05 NOTE — ED Triage Notes (Signed)
Pt has had 8/10 HA since last Wednesday. OTC meds are not working. Today HA is the worst it has been. Pt is sensitive to lights and sound. Pt is on warfarin and stopped taking it 9 days ago for a procedure. HA started after being off warfarin for 4 days.

## 2023-03-06 ENCOUNTER — Emergency Department (HOSPITAL_BASED_OUTPATIENT_CLINIC_OR_DEPARTMENT_OTHER)
Admission: EM | Admit: 2023-03-06 | Discharge: 2023-03-06 | Disposition: A | Discharge status: Home / Self Care | Payer: Medicaid Other | Source: Home / Self Care | Attending: Emergency Medicine | Admitting: Emergency Medicine

## 2023-03-06 ENCOUNTER — Emergency Department (HOSPITAL_BASED_OUTPATIENT_CLINIC_OR_DEPARTMENT_OTHER): Payer: Medicaid Other

## 2023-03-06 ENCOUNTER — Encounter (HOSPITAL_BASED_OUTPATIENT_CLINIC_OR_DEPARTMENT_OTHER): Payer: Self-pay | Admitting: Emergency Medicine

## 2023-03-06 DIAGNOSIS — G43809 Other migraine, not intractable, without status migrainosus: Secondary | ICD-10-CM | POA: Diagnosis not present

## 2023-03-06 LAB — PROTIME-INR
INR: 1.7 — ABNORMAL HIGH (ref 0.8–1.2)
Prothrombin Time: 19.9 seconds — ABNORMAL HIGH (ref 11.4–15.2)

## 2023-03-06 LAB — BASIC METABOLIC PANEL
Anion gap: 8 (ref 5–15)
BUN: 16 mg/dL (ref 6–20)
CO2: 27 mmol/L (ref 22–32)
Calcium: 8.5 mg/dL — ABNORMAL LOW (ref 8.9–10.3)
Chloride: 105 mmol/L (ref 98–111)
Creatinine, Ser: 0.91 mg/dL (ref 0.44–1.00)
GFR, Estimated: >60 mL/min (ref >60)
Glucose, Bld: 88 mg/dL (ref 70–99)
Potassium: 3.5 mmol/L (ref 3.5–5.1)
Sodium: 140 mmol/L (ref 135–145)

## 2023-03-06 LAB — CBC WITH DIFFERENTIAL/PLATELET
Abs Immature Granulocytes: 0.01 10*3/uL (ref 0.00–0.07)
Basophils Absolute: 0.1 10*3/uL (ref 0.0–0.1)
Basophils Relative: 1 %
Eosinophils Absolute: 0.2 10*3/uL (ref 0.0–0.5)
Eosinophils Relative: 4 %
HCT: 35.6 % — ABNORMAL LOW (ref 36.0–46.0)
Hemoglobin: 10.7 g/dL — ABNORMAL LOW (ref 12.0–15.0)
Immature Granulocytes: 0 %
Lymphocytes Relative: 54 %
Lymphs Abs: 3.5 10*3/uL (ref 0.7–4.0)
MCH: 22.8 pg — ABNORMAL LOW (ref 26.0–34.0)
MCHC: 30.1 g/dL (ref 30.0–36.0)
MCV: 75.9 fL — ABNORMAL LOW (ref 80.0–100.0)
Monocytes Absolute: 0.5 10*3/uL (ref 0.1–1.0)
Monocytes Relative: 8 %
Neutro Abs: 2.1 10*3/uL (ref 1.7–7.7)
Neutrophils Relative %: 33 %
Platelets: 279 10*3/uL (ref 150–400)
RBC: 4.69 MIL/uL (ref 3.87–5.11)
RDW: 17.2 % — ABNORMAL HIGH (ref 11.5–15.5)
WBC: 6.4 10*3/uL (ref 4.0–10.5)
nRBC: 0 % (ref 0.0–0.2)

## 2023-03-06 MED ORDER — KETOROLAC TROMETHAMINE 30 MG/ML IJ SOLN
15.0000 mg | Freq: Once | INTRAMUSCULAR | Status: AC
  Administered 2023-03-06: 15 mg via INTRAVENOUS
  Filled 2023-03-06: qty 1

## 2023-03-06 MED ORDER — DIPHENHYDRAMINE HCL 50 MG/ML IJ SOLN
25.0000 mg | Freq: Once | INTRAMUSCULAR | Status: AC
  Administered 2023-03-06: 25 mg via INTRAVENOUS
  Filled 2023-03-06: qty 1

## 2023-03-06 MED ORDER — PROCHLORPERAZINE EDISYLATE 10 MG/2ML IJ SOLN
10.0000 mg | Freq: Once | INTRAMUSCULAR | Status: AC
  Administered 2023-03-06: 10 mg via INTRAVENOUS
  Filled 2023-03-06: qty 2

## 2023-03-06 NOTE — ED Triage Notes (Signed)
Headache x 6 days. Was triaged at [facility] left and came here  "Pt has had 8/10 HA since last Wednesday. OTC meds are not working. Today HA is the worst it has been. Pt is sensitive to lights and sound. Pt is on warfarin and stopped taking it 9 days ago for a procedure. HA started after being off warfarin for 4 days. "

## 2023-03-06 NOTE — ED Notes (Signed)
Reviewed AVS with patient, patient expressed understanding of directions, denies further questions at this time. 

## 2023-03-06 NOTE — ED Notes (Signed)
Pt returned from CT at this time.  

## 2023-03-06 NOTE — ED Provider Notes (Signed)
EMERGENCY DEPARTMENT AT [facility]  Provider Note  CSN: [id number] Arrival date & time: 03/06/23 0137  History Chief Complaint  Patient presents with   Headache    Mary Chambers is a 52 y.o. female with history of mechanical aortic valve on coumadin reports she stopped taking it about 10 days ago in preparation for a steroid injection in her back but found out about a week ago the procedure was not scheduled when she thought. She has been back on her usual dose for 7 days but began having a headache 6 days ago. She reports headache was initially mild and intermittent but has been more severe today, diffuse throbbing headache, associated with nausea and sensitivity to lights and sounds. She reports some scotoma. Has had migraines in the past, this has some similarities and some differences. Advised by her coumadin clinic to come to the ED for evaluation.   Home Medications Prior to Admission medications   Medication Sig Start Date End Date Taking? Authorizing Provider  amLODipine (NORVASC) 2.5 MG tablet TAKE 1 TABLET BY MOUTH EVERY DAY 11/16/22   Jodelle Red, MD  amoxicillin (AMOXIL) 500 MG capsule Take 4 capsules (2000 mg) 30-60 min prior to dental work 01/24/23   Jodelle Red, MD  Ascorbic Acid (VITAMIN C) 500 MG CAPS Take 500 mg by mouth in the morning.    [provider]  aspirin EC 81 MG tablet Take 1 tablet (81 mg total) by mouth daily. Swallow whole. 08/15/22   Jodelle Red, MD  atorvastatin (LIPITOR) 10 MG tablet Take 1 tablet (10 mg total) by mouth every evening. 08/15/22   Jodelle Red, MD  benzonatate (TESSALON) 100 MG capsule Take 1 capsule (100 mg total) by mouth 3 (three) times daily as needed. 12/31/22   Margaretann Loveless, PA-C  cephALEXin (KEFLEX) 500 MG capsule Take 1 capsule (500 mg total) by mouth 3 (three) times daily. 02/20/23   McDonald, Rachelle Hora, DPM  cholecalciferol (VITAMIN D3) 25 MCG (1000 UT)  tablet Take 1,000 Units by mouth in the morning.    [provider]  escitalopram (LEXAPRO) 20 MG tablet TAKE 1 TABLET BY MOUTH EVERY DAY 01/18/23   Novella Olive, FNP  fluticasone (FLONASE) 50 MCG/ACT nasal spray Place 2 sprays into both nostrils daily. 04/22/22   Daphine Deutscher Mary-Margaret, FNP  furosemide (LASIX) 20 MG tablet Take 1 tablet (20 mg total) by mouth daily. 08/15/22   Jodelle Red, MD  ipratropium (ATROVENT) 0.03 % nasal spray Place 2 sprays into both nostrils every 12 (twelve) hours. 02/01/23   Margaretann Loveless, PA-C  loperamide (IMODIUM) 2 MG capsule Take 1 capsule (2 mg total) by mouth as needed for diarrhea or loose stools. 07/01/21   Rhetta Mura, MD  methylPREDNISolone (MEDROL DOSEPAK) 4 MG TBPK tablet Use as directed on packaging 01/17/23   Schutt, Edsel Petrin, PA-C  metoprolol tartrate (LOPRESSOR) 25 MG tablet Take 0.5 tablets (12.5 mg total) by mouth 2 (two) times daily. 08/15/22   Jodelle Red, MD  Multiple Vitamin (MULTIVITAMIN WITH MINERALS) TABS tablet Take 1 tablet by mouth in the morning.    [provider]  naproxen (NAPROSYN) 250 MG tablet Take 1 tablet (250 mg total) by mouth 2 (two) times daily as needed. 10/20/22   de [place], Raymond J, MD  neomycin-polymyxin-hydrocortisone (CORTISPORIN) 3.5-10000-1 OTIC suspension Apply 1-2 drops daily after soaking and cover with bandaid 02/07/23   McDonald, Rachelle Hora, DPM  pantoprazole (PROTONIX) 40 MG tablet Take 1 tablet (  40 mg total) by mouth daily. 08/15/22   Jodelle Red, MD  Potassium (POTASSIMIN PO) Take 1 tablet by mouth daily as needed (with lasix).    [provider]  Semaglutide-Weight Management 0.5 MG/0.5ML SOAJ Inject 0.5 mg into the skin once a week for 28 days. 02/10/23 03/10/23  Alver Sorrow, NP  Semaglutide-Weight Management 1 MG/0.5ML SOAJ Inject 1 mg into the skin once a week for 28 days. 03/11/23 04/08/23  Alver Sorrow, NP  Semaglutide-Weight Management 1.7  MG/0.75ML SOAJ Inject 1.7 mg into the skin once a week for 28 days. 04/09/23 05/07/23  Alver Sorrow, NP  Semaglutide-Weight Management 2.4 MG/0.75ML SOAJ Inject 2.4 mg into the skin once a week for 28 days. 05/08/23 06/05/23  Alver Sorrow, NP  silver sulfADIAZINE (SILVADENE) 1 % cream Apply 1 Application topically daily. 01/03/23   McDonald, Rachelle Hora, DPM  tiZANidine (ZANAFLEX) 4 MG tablet Take 1 tablet (4 mg total) by mouth at bedtime. 01/31/23   Novella Olive, FNP  warfarin (COUMADIN) 5 MG tablet TAKE 1 TO 1 & 1/2 TABLETS DAILY OR AS DIRECTED BY COUMADIN CLINIC 12/18/22   Jodelle Red, MD     Allergies    Baclofen, Dilaudid [hydromorphone], Oxycodone hcl, Percocet [oxycodone-acetaminophen], Septra [sulfamethoxazole-trimethoprim], Vibramycin [doxycycline], and Neurontin [gabapentin]   Review of Systems   Review of Systems Please see HPI for pertinent positives and negatives  Physical Exam BP 139/76 (BP Location: Right Arm)   Pulse 72   Temp 98.2 F (36.8 C) (Oral)   Resp 18   SpO2 96%   Physical Exam Vitals and nursing note reviewed.  Constitutional:      Appearance: Normal appearance.  HENT:     Head: Normocephalic and atraumatic.     Nose: Nose normal.     Mouth/Throat:     Mouth: Mucous membranes are moist.  Eyes:     Extraocular Movements: Extraocular movements intact.     Conjunctiva/sclera: Conjunctivae normal.  Cardiovascular:     Rate and Rhythm: Normal rate.  Pulmonary:     Effort: Pulmonary effort is normal.     Breath sounds: Normal breath sounds.  Abdominal:     General: Abdomen is flat.     Palpations: Abdomen is soft.     Tenderness: There is no abdominal tenderness.  Musculoskeletal:        General: No swelling. Normal range of motion.     Cervical back: Neck supple.  Skin:    General: Skin is warm and dry.  Neurological:     General: No focal deficit present.     Mental Status: She is alert and oriented to person, place, and time.      Cranial Nerves: No cranial nerve deficit.     Sensory: No sensory deficit.     Motor: No weakness.  Psychiatric:        Mood and Affect: Mood normal.     ED Results / Procedures / Treatments   EKG None  Procedures Procedures  Medications Ordered in the ED Medications  prochlorperazine (COMPAZINE) injection 10 mg (10 mg Intravenous Given 03/06/23 0332)  diphenhydrAMINE (BENADRYL) injection 25 mg (25 mg Intravenous Given 03/06/23 0334)  ketorolac (TORADOL) 30 MG/ML injection 15 mg (15 mg Intravenous Given 03/06/23 0359)    Initial Impression and Plan  Patient here with headache, worsening for about a week, was off coumadin for a few days but has been back on her usual dose for a week now. Exam and vitals  are reassuring. Will check labs including INR and send for head CT.   ED Course   Clinical Course as of 03/06/23 0426  Tue Mar 06, 2023  0346 CBC with mild anemia, at baseline.  [CS]  0349 INR is 1.7, patient reports her goal is 1.5-2.5.  [CS]  0351 I personally viewed the images from radiology studies and agree with radiologist interpretation: CT is normal. Will give dose of Toradol as well.  [CS]  0402 BMP is normal.  [CS]  0425 Patient reports she is feeling better. Reassured no emergent finding on CT. She will continue her coumadin as prescribed. Follow up in coumadin clinic. PCP follow up, RTED for any other concerns.   [CS]    Clinical Course User Index [CS] Pollyann Savoy, MD     MDM Rules/Calculators/A&P Medical Decision Making Problems Addressed: Other migraine without status migrainosus, not intractable: acute illness or injury  Amount and/or Complexity of Data Reviewed Labs: ordered. Decision-making details documented in ED Course. Radiology: ordered and independent interpretation performed. Decision-making details documented in ED Course.  Risk Prescription drug management.     Final Clinical Impression(s) / ED Diagnoses Final diagnoses:  Other  migraine without status migrainosus, not intractable    Rx / DC Orders ED Discharge Orders     None        Pollyann Savoy, MD 03/06/23 [id number]

## 2023-03-07 ENCOUNTER — Ambulatory Visit (INDEPENDENT_AMBULATORY_CARE_PROVIDER_SITE_OTHER): Payer: Medicaid Other | Admitting: Family Medicine

## 2023-03-07 ENCOUNTER — Ambulatory Visit (HOSPITAL_BASED_OUTPATIENT_CLINIC_OR_DEPARTMENT_OTHER): Payer: Medicaid Other | Admitting: Cardiology

## 2023-03-07 ENCOUNTER — Ambulatory Visit: Payer: Medicaid Other | Attending: Cardiovascular Disease | Admitting: *Deleted

## 2023-03-07 ENCOUNTER — Encounter: Payer: Self-pay | Admitting: Podiatry

## 2023-03-07 ENCOUNTER — Encounter (HOSPITAL_BASED_OUTPATIENT_CLINIC_OR_DEPARTMENT_OTHER): Payer: Self-pay | Admitting: Family Medicine

## 2023-03-07 VITALS — BP 135/90 | HR 75 | Temp 97.8°F | Ht 67.0 in | Wt 287.0 lb

## 2023-03-07 DIAGNOSIS — H9209 Otalgia, unspecified ear: Secondary | ICD-10-CM | POA: Insufficient documentation

## 2023-03-07 DIAGNOSIS — N261 Atrophy of kidney (terminal): Secondary | ICD-10-CM

## 2023-03-07 DIAGNOSIS — Z952 Presence of prosthetic heart valve: Secondary | ICD-10-CM

## 2023-03-07 DIAGNOSIS — F419 Anxiety disorder, unspecified: Secondary | ICD-10-CM | POA: Diagnosis not present

## 2023-03-07 DIAGNOSIS — F32A Depression, unspecified: Secondary | ICD-10-CM

## 2023-03-07 DIAGNOSIS — I351 Nonrheumatic aortic (valve) insufficiency: Secondary | ICD-10-CM

## 2023-03-07 DIAGNOSIS — H9201 Otalgia, right ear: Secondary | ICD-10-CM

## 2023-03-07 DIAGNOSIS — Z7901 Long term (current) use of anticoagulants: Secondary | ICD-10-CM

## 2023-03-07 HISTORY — DX: Otalgia, unspecified ear: H92.09

## 2023-03-07 LAB — POCT INR: INR: 1.5 — AB (ref 2.0–3.0)

## 2023-03-07 MED ORDER — AMOXICILLIN-POT CLAVULANATE 875-125 MG PO TABS
1.0000 | ORAL_TABLET | Freq: Two times a day (BID) | ORAL | 0 refills | Status: DC
Start: 2023-03-07 — End: 2023-03-24

## 2023-03-07 NOTE — Assessment & Plan Note (Addendum)
Patient previously was referred to counseling/therapy.  She indicates that it took quite some time to hear back from specialist office and after scheduling with them, there was a conflict with one of her appointments and since she declined a virtual appointment, she reports that they discharged her from the office.  She is requesting referral to new provider locally to establish care.  Referral placed today

## 2023-03-07 NOTE — Progress Notes (Signed)
    Procedures performed today:    None.  Independent interpretation of notes and tests performed by another provider:   None.  Brief History, Exam, Impression, and Recommendations:    BP (!) 135/90 (BP Location: Right Arm, Patient Position: Sitting, Cuff Size: Large)   Pulse 75   Temp 97.8 F (36.6 C) (Oral)   Ht 5\' 7"  (1.702 m)   Wt 287 lb (130.2 kg)   SpO2 98%   BMI 44.95 kg/m   Ear pain Patient has had ongoing right ear pain for least 2 to 3 weeks now.  Feels that when she has a pain occur, she possibly has some degree of decreased hearing in that ear.  Has had some ongoing sinus congestion, has been utilizing OTC medications.  She was recently seen in the ER pertaining to ongoing headache.  Currently this is improved following treatment in the emergency department.  Has not had any recent fever, chills, sweats.  Not necessarily aware of any aggravating factors for right ear pain On exam, patient is in no acute distress, vital signs stable, patient is afebrile.  Left ear with normal external auditory canal, right ear with normal-appearing external auditory canal, tympanic membrane with mild bulging, no significant purulent effusion present. Suspect the symptoms are related to ongoing sinus irritation/inflammation, possible presence of otitis media.  Discussed options with patient, we will proceed with use of intranasal steroid spray, OTC antihistamines, can also proceed with antibiotic therapy. We will also proceed with referral to ENT such that if symptoms persist despite above measures, patient can schedule further evaluation  Anxiety and depression Patient previously was referred to counseling/therapy.  She indicates that it took quite some time to hear back from specialist office and after scheduling with them, there was a conflict with one of her appointments and since she declined a virtual appointment, she reports that they discharged her from the office.  She is requesting  referral to new provider locally to establish care.  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  It does appear that this has been present previously and patient did have evaluation with [facility] urology in 2015.  Evaluation at that time did reveal decreased left kidney function.  She did not have any evidence of obstruction at that time. Because of findings served on recent exam, patient is requesting referral to urologist locally for further evaluation and recommendations.  Referral to urology placed today  Return in about 4 months (around 07/07/2023).   ___________________________________________ Lincon Sahlin de [place], MD, ABFM, CAQSM Primary Care and Sports Medicine [facility]

## 2023-03-07 NOTE — Assessment & Plan Note (Signed)
Noted on prior imaging.  It does appear that this has been present previously and patient did have evaluation with [facility] urology in 2015.  Evaluation at that time did reveal decreased left kidney function.  She did not have any evidence of obstruction at that time. Because of findings served on recent exam, patient is requesting referral to urologist locally for further evaluation and recommendations.  Referral to urology placed today

## 2023-03-07 NOTE — Patient Instructions (Signed)
  Medication Instructions:  Your physician recommends that you continue on your current medications as directed. Please refer to the Current Medication list given to you today. --If you need a refill on any your medications before your next appointment, please call your pharmacy first. If no refills are authorized on file call the office.-- Lab Work: Your physician has recommended that you have lab work today: No If you have labs (blood work) drawn today and your tests are completely normal, you will receive your results via MyChart message OR a phone call from our staff.  Please ensure you check your voicemail in the event that you authorized detailed messages to be left on a delegated number. If you have any lab test that is abnormal or we need to change your treatment, we will call you to review the results.  Referrals/Procedures/Imaging: Yes  Follow-Up: Your next appointment:   Your physician recommends that you schedule a follow-up appointment in: 4 months with Dr. de [place].  You will receive a text message or e-mail with a link to a survey about your care and experience with us today! We would greatly appreciate your feedback!   Thanks for letting us be apart of your health journey!!  Primary Care and Sports Medicine   Dr. Raymond de Cuba   We encourage you to activate your patient portal called "MyChart".  Sign up information is provided on this After Visit Summary.  MyChart is used to connect with patients for Virtual Visits (Telemedicine).  Patients are able to view lab/test results, encounter notes, upcoming appointments, etc.  Non-urgent messages can be sent to your provider as well. To learn more about what you can do with MyChart, please visit --  [email or web address].    

## 2023-03-07 NOTE — Assessment & Plan Note (Signed)
Patient has had ongoing right ear pain for least 2 to 3 weeks now.  Feels that when she has a pain occur, she possibly has some degree of decreased hearing in that ear.  Has had some ongoing sinus congestion, has been utilizing OTC medications.  She was recently seen in the ER pertaining to ongoing headache.  Currently this is improved following treatment in the emergency department.  Has not had any recent fever, chills, sweats.  Not necessarily aware of any aggravating factors for right ear pain On exam, patient is in no acute distress, vital signs stable, patient is afebrile.  Left ear with normal external auditory canal, right ear with normal-appearing external auditory canal, tympanic membrane with mild bulging, no significant purulent effusion present. Suspect the symptoms are related to ongoing sinus irritation/inflammation, possible presence of otitis media.  Discussed options with patient, we will proceed with use of intranasal steroid spray, OTC antihistamines, can also proceed with antibiotic therapy. We will also proceed with referral to ENT such that if symptoms persist despite above measures, patient can schedule further evaluation

## 2023-03-07 NOTE — Patient Instructions (Signed)
Description   Continue taking Warfarin 1 tablet (5mg ) daily except 1/2 tablet (2.5mg ) on Mondays.  Keep the number of servings of green leafy vegetables consistent in your diet.  Recheck in 1 week post procedure.    Coumadin Clinic [phone] call with medication changes and or bleeding.

## 2023-03-09 ENCOUNTER — Telehealth: Payer: Medicaid Other | Admitting: Nurse Practitioner

## 2023-03-09 DIAGNOSIS — J029 Acute pharyngitis, unspecified: Secondary | ICD-10-CM

## 2023-03-09 NOTE — Progress Notes (Signed)
E-Visit for Sore Throat  We are sorry that you are not feeling well.  Here is how we plan to help!  Your symptoms indicate a likely viral infection (Pharyngitis).   Pharyngitis is inflammation in the back of the throat which can cause a sore throat, scratchiness and sometimes difficulty swallowing.   Pharyngitis is typically caused by a respiratory virus and will just run its course.  Please keep in mind that your symptoms could last up to 10 days.  For throat pain, we recommend over the counter oral pain relief medications such as acetaminophen or aspirin, or anti-inflammatory medications such as ibuprofen or naproxen sodium.  Topical treatments such as oral throat lozenges or sprays may be used as needed.  Avoid close contact with loved ones, especially the very young and elderly.  Remember to wash your hands thoroughly throughout the day as this is the number one way to prevent the spread of infection and wipe down door knobs and counters with disinfectant.  You are currently on an antibiotic that would cover strep throat so we assume the new sore throat is from a virus, post nasal drainage or allergies.   After careful review of your answers, I would not recommend an antibiotic for your condition.  Antibiotics should not be used to treat conditions that we suspect are caused by viruses like the virus that causes the common cold or flu. However, some people can have Strep with atypical symptoms. You may need formal testing in clinic or office to confirm if your symptoms continue or worsen.  Providers prescribe antibiotics to treat infections caused by bacteria. Antibiotics are very powerful in treating bacterial infections when they are used properly.  To maintain their effectiveness, they should be used only when necessary.  Overuse of antibiotics has resulted in the development of super bugs that are resistant to treatment!    Home Care: Only take medications as instructed by your medical  team. Do not drink alcohol while taking these medications. A steam or ultrasonic humidifier can help congestion.  You can place a towel over your head and breathe in the steam from hot water coming from a faucet. Avoid close contacts especially the very young and the elderly. Cover your mouth when you cough or sneeze. Always remember to wash your hands.  Get Help Right Away If: You develop worsening fever or throat pain. You develop a severe head ache or visual changes. Your symptoms persist after you have completed your treatment plan.  Make sure you Understand these instructions. Will watch your condition. Will get help right away if you are not doing well or get worse.   Thank you for choosing an e-visit.  Your e-visit answers were reviewed by a board certified advanced clinical practitioner to complete your personal care plan. Depending upon the condition, your plan could have included both over the counter or prescription medications.  Please review your pharmacy choice. Make sure the pharmacy is open so you can pick up prescription now. If there is a problem, you may contact your provider through [employer] and have the prescription routed to another pharmacy.  Your safety is important to [place]. If you have drug allergies check your prescription carefully.   For the next 24 hours you can use MyChart to ask questions about today's visit, request a non-urgent call back, or ask for a work or school excuse. You will get an email in the next two days asking about your experience. I hope that your e-visit  has been valuable and will speed your recovery.   I spent approximately 5 minutes reviewing the patient's history, current symptoms and coordinating their care today.

## 2023-03-12 ENCOUNTER — Ambulatory Visit: Payer: Medicaid Other | Admitting: Podiatry

## 2023-03-14 ENCOUNTER — Telehealth: Payer: Medicaid Other | Admitting: Nurse Practitioner

## 2023-03-14 DIAGNOSIS — M545 Low back pain, unspecified: Secondary | ICD-10-CM

## 2023-03-14 NOTE — Progress Notes (Signed)
Rainy.  Thank you for submitting an e-visit request. Because you have a history of back pain and this may be more of a chronic condition we suggest follow up with your primary care.   If lower back pain with pain into your leg continues you may need imaging for evaluation   Thank you   I feel your condition warrants further evaluation and I recommend that you be seen for a face to face visit.  Please contact your primary care physician practice to be seen. Many offices offer virtual options to be seen via video if you are not comfortable going in person to a medical facility at this time.  NOTE: You will NOT be charged for this eVisit.  If you do not have a PCP, [facility] offers a free physician referral service available at [phone]. Our trained staff has the experience, knowledge and resources to put you in touch with a physician who is right for you.    If you are having a true medical emergency please call 911.   Your e-visit answers were reviewed by a board certified advanced clinical practitioner to complete your personal care plan.  Thank you for using e-Visits.

## 2023-03-19 ENCOUNTER — Encounter (HOSPITAL_BASED_OUTPATIENT_CLINIC_OR_DEPARTMENT_OTHER): Payer: Self-pay | Admitting: Family Medicine

## 2023-03-21 ENCOUNTER — Emergency Department: Payer: Medicaid Other

## 2023-03-21 ENCOUNTER — Encounter (HOSPITAL_BASED_OUTPATIENT_CLINIC_OR_DEPARTMENT_OTHER): Payer: Self-pay

## 2023-03-21 ENCOUNTER — Encounter: Payer: Self-pay | Admitting: Internal Medicine

## 2023-03-21 ENCOUNTER — Other Ambulatory Visit: Payer: Self-pay

## 2023-03-21 ENCOUNTER — Inpatient Hospital Stay
Admission: EM | Admit: 2023-03-21 | Discharge: 2023-03-24 | DRG: 872 | Disposition: A | Discharge status: Home / Self Care | Payer: Medicaid Other | Attending: Student | Admitting: Student

## 2023-03-21 ENCOUNTER — Ambulatory Visit: Payer: Medicaid Other

## 2023-03-21 DIAGNOSIS — F32A Depression, unspecified: Secondary | ICD-10-CM | POA: Diagnosis present

## 2023-03-21 DIAGNOSIS — N10 Acute tubulo-interstitial nephritis: Secondary | ICD-10-CM | POA: Insufficient documentation

## 2023-03-21 DIAGNOSIS — Z885 Allergy status to narcotic agent status: Secondary | ICD-10-CM

## 2023-03-21 DIAGNOSIS — M546 Pain in thoracic spine: Secondary | ICD-10-CM | POA: Diagnosis present

## 2023-03-21 DIAGNOSIS — R000 Tachycardia, unspecified: Secondary | ICD-10-CM

## 2023-03-21 DIAGNOSIS — Z8661 Personal history of infections of the central nervous system: Secondary | ICD-10-CM

## 2023-03-21 DIAGNOSIS — Z9049 Acquired absence of other specified parts of digestive tract: Secondary | ICD-10-CM

## 2023-03-21 DIAGNOSIS — Z886 Allergy status to analgesic agent status: Secondary | ICD-10-CM

## 2023-03-21 DIAGNOSIS — Z87442 Personal history of urinary calculi: Secondary | ICD-10-CM

## 2023-03-21 DIAGNOSIS — Z6841 Body Mass Index (BMI) 40.0 and over, adult: Secondary | ICD-10-CM

## 2023-03-21 DIAGNOSIS — Z981 Arthrodesis status: Secondary | ICD-10-CM

## 2023-03-21 DIAGNOSIS — Z5982 Transportation insecurity: Secondary | ICD-10-CM

## 2023-03-21 DIAGNOSIS — K5730 Diverticulosis of large intestine without perforation or abscess without bleeding: Secondary | ICD-10-CM | POA: Diagnosis present

## 2023-03-21 DIAGNOSIS — R7401 Elevation of levels of liver transaminase levels: Secondary | ICD-10-CM | POA: Diagnosis present

## 2023-03-21 DIAGNOSIS — Z7982 Long term (current) use of aspirin: Secondary | ICD-10-CM

## 2023-03-21 DIAGNOSIS — N179 Acute kidney failure, unspecified: Secondary | ICD-10-CM | POA: Diagnosis present

## 2023-03-21 DIAGNOSIS — G894 Chronic pain syndrome: Secondary | ICD-10-CM | POA: Diagnosis present

## 2023-03-21 DIAGNOSIS — I10 Essential (primary) hypertension: Secondary | ICD-10-CM | POA: Diagnosis present

## 2023-03-21 DIAGNOSIS — E559 Vitamin D deficiency, unspecified: Secondary | ICD-10-CM | POA: Diagnosis present

## 2023-03-21 DIAGNOSIS — Z820 Family history of epilepsy and other diseases of the nervous system: Secondary | ICD-10-CM

## 2023-03-21 DIAGNOSIS — T368X5A Adverse effect of other systemic antibiotics, initial encounter: Secondary | ICD-10-CM | POA: Diagnosis present

## 2023-03-21 DIAGNOSIS — Z833 Family history of diabetes mellitus: Secondary | ICD-10-CM

## 2023-03-21 DIAGNOSIS — Z8616 Personal history of COVID-19: Secondary | ICD-10-CM

## 2023-03-21 DIAGNOSIS — Z8249 Family history of ischemic heart disease and other diseases of the circulatory system: Secondary | ICD-10-CM

## 2023-03-21 DIAGNOSIS — Z8679 Personal history of other diseases of the circulatory system: Secondary | ICD-10-CM

## 2023-03-21 DIAGNOSIS — Z79899 Other long term (current) drug therapy: Secondary | ICD-10-CM

## 2023-03-21 DIAGNOSIS — Z888 Allergy status to other drugs, medicaments and biological substances status: Secondary | ICD-10-CM

## 2023-03-21 DIAGNOSIS — F419 Anxiety disorder, unspecified: Secondary | ICD-10-CM | POA: Diagnosis present

## 2023-03-21 DIAGNOSIS — Z90710 Acquired absence of both cervix and uterus: Secondary | ICD-10-CM

## 2023-03-21 DIAGNOSIS — R600 Localized edema: Secondary | ICD-10-CM | POA: Diagnosis present

## 2023-03-21 DIAGNOSIS — Z5986 Financial insecurity: Secondary | ICD-10-CM

## 2023-03-21 DIAGNOSIS — R6520 Severe sepsis without septic shock: Secondary | ICD-10-CM | POA: Diagnosis present

## 2023-03-21 DIAGNOSIS — R7881 Bacteremia: Secondary | ICD-10-CM | POA: Insufficient documentation

## 2023-03-21 DIAGNOSIS — Z952 Presence of prosthetic heart valve: Secondary | ICD-10-CM

## 2023-03-21 DIAGNOSIS — G4730 Sleep apnea, unspecified: Secondary | ICD-10-CM | POA: Diagnosis present

## 2023-03-21 DIAGNOSIS — G43909 Migraine, unspecified, not intractable, without status migrainosus: Secondary | ICD-10-CM | POA: Diagnosis present

## 2023-03-21 DIAGNOSIS — R519 Headache, unspecified: Secondary | ICD-10-CM | POA: Diagnosis present

## 2023-03-21 DIAGNOSIS — K219 Gastro-esophageal reflux disease without esophagitis: Secondary | ICD-10-CM | POA: Diagnosis present

## 2023-03-21 DIAGNOSIS — Z7901 Long term (current) use of anticoagulants: Secondary | ICD-10-CM

## 2023-03-21 DIAGNOSIS — A4151 Sepsis due to Escherichia coli [E. coli]: Principal | ICD-10-CM | POA: Diagnosis present

## 2023-03-21 DIAGNOSIS — K589 Irritable bowel syndrome without diarrhea: Secondary | ICD-10-CM | POA: Diagnosis present

## 2023-03-21 DIAGNOSIS — E119 Type 2 diabetes mellitus without complications: Secondary | ICD-10-CM | POA: Diagnosis present

## 2023-03-21 DIAGNOSIS — G4700 Insomnia, unspecified: Secondary | ICD-10-CM | POA: Diagnosis present

## 2023-03-21 DIAGNOSIS — E785 Hyperlipidemia, unspecified: Secondary | ICD-10-CM | POA: Diagnosis present

## 2023-03-21 DIAGNOSIS — Z881 Allergy status to other antibiotic agents status: Secondary | ICD-10-CM

## 2023-03-21 DIAGNOSIS — N12 Tubulo-interstitial nephritis, not specified as acute or chronic: Principal | ICD-10-CM

## 2023-03-21 DIAGNOSIS — D509 Iron deficiency anemia, unspecified: Secondary | ICD-10-CM | POA: Diagnosis present

## 2023-03-21 DIAGNOSIS — I2489 Other forms of acute ischemic heart disease: Secondary | ICD-10-CM | POA: Diagnosis present

## 2023-03-21 DIAGNOSIS — Z8711 Personal history of peptic ulcer disease: Secondary | ICD-10-CM

## 2023-03-21 DIAGNOSIS — Z5941 Food insecurity: Secondary | ICD-10-CM

## 2023-03-21 HISTORY — DX: Osteomyelitis of vertebra, site unspecified: M46.20

## 2023-03-21 LAB — BASIC METABOLIC PANEL
Anion gap: 12 (ref 5–15)
BUN: 22 mg/dL — ABNORMAL HIGH (ref 6–20)
CO2: 21 mmol/L — ABNORMAL LOW (ref 22–32)
Calcium: 8.2 mg/dL — ABNORMAL LOW (ref 8.9–10.3)
Chloride: 105 mmol/L (ref 98–111)
Creatinine, Ser: 1.14 mg/dL — ABNORMAL HIGH (ref 0.44–1.00)
GFR, Estimated: 58 mL/min — ABNORMAL LOW (ref >60)
Glucose, Bld: 120 mg/dL — ABNORMAL HIGH (ref 70–99)
Potassium: 3.6 mmol/L (ref 3.5–5.1)
Sodium: 138 mmol/L (ref 135–145)

## 2023-03-21 LAB — URINALYSIS, W/ REFLEX TO CULTURE (INFECTION SUSPECTED)
Bilirubin Urine: NEGATIVE
Glucose, UA: NEGATIVE mg/dL
Ketones, ur: NEGATIVE mg/dL
Nitrite: POSITIVE — AB
Protein, ur: 100 mg/dL — AB
Specific Gravity, Urine: >1.046 — ABNORMAL HIGH (ref 1.005–1.030)
WBC, UA: >50 WBC/hpf (ref 0–5)
pH: 6 (ref 5.0–8.0)

## 2023-03-21 LAB — PROTIME-INR
INR: 1.8 — ABNORMAL HIGH (ref 0.8–1.2)
Prothrombin Time: 20.7 seconds — ABNORMAL HIGH (ref 11.4–15.2)

## 2023-03-21 LAB — CBC
HCT: 41 % (ref 36.0–46.0)
Hemoglobin: 12.6 g/dL (ref 12.0–15.0)
MCH: 23.1 pg — ABNORMAL LOW (ref 26.0–34.0)
MCHC: 30.7 g/dL (ref 30.0–36.0)
MCV: 75.1 fL — ABNORMAL LOW (ref 80.0–100.0)
Platelets: 252 10*3/uL (ref 150–400)
RBC: 5.46 MIL/uL — ABNORMAL HIGH (ref 3.87–5.11)
RDW: 17.3 % — ABNORMAL HIGH (ref 11.5–15.5)
WBC: 13.3 10*3/uL — ABNORMAL HIGH (ref 4.0–10.5)
nRBC: 0 % (ref 0.0–0.2)

## 2023-03-21 LAB — HEPATIC FUNCTION PANEL
ALT: 20 U/L (ref 0–44)
AST: 23 U/L (ref 15–41)
Albumin: 4 g/dL (ref 3.5–5.0)
Alkaline Phosphatase: 80 U/L (ref 38–126)
Bilirubin, Direct: 0.2 mg/dL (ref 0.0–0.2)
Indirect Bilirubin: 0.8 mg/dL (ref 0.3–0.9)
Total Bilirubin: 1 mg/dL (ref 0.3–1.2)
Total Protein: 7.4 g/dL (ref 6.5–8.1)

## 2023-03-21 LAB — TYPE AND SCREEN
ABO/RH(D): O POS
Antibody Screen: NEGATIVE

## 2023-03-21 LAB — TROPONIN I (HIGH SENSITIVITY)
Troponin I (High Sensitivity): 19 ng/L — ABNORMAL HIGH (ref <18)
Troponin I (High Sensitivity): 51 ng/L — ABNORMAL HIGH (ref <18)

## 2023-03-21 LAB — LACTIC ACID, PLASMA: Lactic Acid, Venous: 1.5 mmol/L (ref 0.5–1.9)

## 2023-03-21 LAB — LIPASE, BLOOD: Lipase: 30 U/L (ref 11–51)

## 2023-03-21 MED ORDER — WARFARIN SODIUM 5 MG PO TABS
5.0000 mg | ORAL_TABLET | ORAL | Status: DC
  Administered 2023-03-22 – 2023-03-23 (×2): 5 mg via ORAL
  Filled 2023-03-21 (×4): qty 1

## 2023-03-21 MED ORDER — SODIUM CHLORIDE 0.9 % IV SOLN: INTRAVENOUS | Status: AC

## 2023-03-21 MED ORDER — SODIUM CHLORIDE 0.9 % IV BOLUS
1000.0000 mL | Freq: Once | INTRAVENOUS | Status: AC
  Administered 2023-03-21: 1000 mL via INTRAVENOUS

## 2023-03-21 MED ORDER — WARFARIN SODIUM 2.5 MG PO TABS: 2.5000 mg | ORAL_TABLET | ORAL | Status: DC

## 2023-03-21 MED ORDER — WARFARIN SODIUM 7.5 MG PO TABS
7.5000 mg | ORAL_TABLET | ORAL | Status: AC
  Administered 2023-03-21: 7.5 mg via ORAL
  Filled 2023-03-21: qty 1

## 2023-03-21 MED ORDER — METOPROLOL TARTRATE 25 MG PO TABS
12.5000 mg | ORAL_TABLET | Freq: Two times a day (BID) | ORAL | Status: DC
  Administered 2023-03-21 – 2023-03-24 (×6): 12.5 mg via ORAL
  Filled 2023-03-21 (×6): qty 1

## 2023-03-21 MED ORDER — WARFARIN - PHARMACIST DOSING INPATIENT
Freq: Every day | Status: DC
  Filled 2023-03-21: qty 1

## 2023-03-21 MED ORDER — ESCITALOPRAM OXALATE 10 MG PO TABS
20.0000 mg | ORAL_TABLET | Freq: Every day | ORAL | Status: DC
  Administered 2023-03-21 – 2023-03-24 (×4): 20 mg via ORAL
  Filled 2023-03-21 (×4): qty 2

## 2023-03-21 MED ORDER — ONDANSETRON HCL 4 MG/2ML IJ SOLN
4.0000 mg | Freq: Once | INTRAMUSCULAR | Status: AC
  Administered 2023-03-21: 4 mg via INTRAVENOUS
  Filled 2023-03-21: qty 2

## 2023-03-21 MED ORDER — MORPHINE SULFATE (PF) 4 MG/ML IV SOLN
4.0000 mg | Freq: Once | INTRAVENOUS | Status: AC
  Administered 2023-03-21: 4 mg via INTRAVENOUS
  Filled 2023-03-21: qty 1

## 2023-03-21 MED ORDER — WARFARIN SODIUM 5 MG PO TABS
5.0000 mg | ORAL_TABLET | ORAL | Status: DC
  Filled 2023-03-21: qty 1

## 2023-03-21 MED ORDER — WARFARIN SODIUM 2.5 MG PO TABS
2.5000 mg | ORAL_TABLET | ORAL | Status: DC
  Filled 2023-03-21: qty 1

## 2023-03-21 MED ORDER — ACETAMINOPHEN 325 MG PO TABS
650.0000 mg | ORAL_TABLET | Freq: Once | ORAL | Status: AC
  Administered 2023-03-21: 650 mg via ORAL
  Filled 2023-03-21: qty 2

## 2023-03-21 MED ORDER — ASPIRIN 81 MG PO TBEC
81.0000 mg | DELAYED_RELEASE_TABLET | Freq: Every day | ORAL | Status: DC
  Administered 2023-03-21 – 2023-03-24 (×4): 81 mg via ORAL
  Filled 2023-03-21 (×4): qty 1

## 2023-03-21 MED ORDER — ATORVASTATIN CALCIUM 20 MG PO TABS
10.0000 mg | ORAL_TABLET | Freq: Every evening | ORAL | Status: DC
  Administered 2023-03-21 – 2023-03-23 (×3): 10 mg via ORAL
  Filled 2023-03-21 (×3): qty 1

## 2023-03-21 MED ORDER — FLUTICASONE PROPIONATE 50 MCG/ACT NA SUSP
2.0000 | Freq: Every day | NASAL | Status: DC
  Administered 2023-03-24: 2 via NASAL
  Filled 2023-03-21 (×2): qty 16

## 2023-03-21 MED ORDER — SODIUM CHLORIDE 0.9 % IV SOLN
2.0000 g | INTRAVENOUS | Status: DC
  Administered 2023-03-21 – 2023-03-23 (×3): 2 g via INTRAVENOUS
  Filled 2023-03-21 (×3): qty 20

## 2023-03-21 MED ORDER — IOHEXOL 300 MG/ML  SOLN
100.0000 mL | Freq: Once | INTRAMUSCULAR | Status: AC | PRN
  Administered 2023-03-21: 100 mL via INTRAVENOUS

## 2023-03-21 MED ORDER — SODIUM CHLORIDE 0.9% FLUSH
3.0000 mL | Freq: Two times a day (BID) | INTRAVENOUS | Status: DC
  Administered 2023-03-21 – 2023-03-22 (×2): 3 mL via INTRAVENOUS

## 2023-03-21 MED ORDER — MORPHINE SULFATE (PF) 2 MG/ML IV SOLN: 2.0000 mg | INTRAVENOUS | Status: DC | PRN

## 2023-03-21 MED ORDER — LACTATED RINGERS IV BOLUS (SEPSIS)
1000.0000 mL | Freq: Once | INTRAVENOUS | Status: AC
  Administered 2023-03-21: 1000 mL via INTRAVENOUS

## 2023-03-21 NOTE — Sepsis Progress Note (Signed)
Elink monitoring for the code sepsis protocol.  

## 2023-03-21 NOTE — Progress Notes (Signed)
CODE SEPSIS - PHARMACY COMMUNICATION  **Broad Spectrum Antibiotics should be administered within 1 hour of Sepsis diagnosis**  Time Code Sepsis Called/Page Received: 6/19 @ 2124   Antibiotics Ordered: Ceftriaxone 2 gm   Time of 1st antibiotic administration: Ceftriaxone 2 gm IV X 1 on 6/19 @ 2155  Additional action taken by pharmacy:   If necessary, Name of Provider/Nurse Contacted:     Trevin Gartrell D ,PharmD Clinical Pharmacist  03/21/2023  9:59 PM

## 2023-03-21 NOTE — ED Notes (Signed)
First Nurse Note: Pt to ED via [facility] from home for N/V/headache. Vital signs as follows  BP: 119/81 HR: 124 SpO2: 93% room air RR 22 Temp: 99.0 CBG 126   Pt has 22 G IV in her left hand. Pt was given 4 mg of Zofran and has NS bolus infusing

## 2023-03-21 NOTE — ED Provider Notes (Signed)
[facility] Provider Note    Event Date/Time   First MD Initiated Contact with Patient 03/21/23 1456     (approximate)   History   Headache   HPI  Mary Chambers is a 52 y.o. 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  Yesterday, patient felt mildly fatigued, but otherwise at baseline.  This morning, she woke up with generalized weakness, headache, not sudden in onset or different than prior, nausea with small-volume nonbloody emesis, and right lower abdominal pain.  Did additionally report a subjective fever at home but cannot find her thermometer to check her temperature.  Has a history of sepsis secondary to UTI.  Denies dysuria.     Physical Exam   Triage Vital Signs: ED Triage Vitals  Enc Vitals Group     BP 03/21/23 1356 119/61     Pulse Rate 03/21/23 1356 (!) 129     Resp 03/21/23 1355 18     Temp 03/21/23 1355 98.6 F (37 C)     Temp src --      SpO2 03/21/23 1356 94 %     Weight 03/21/23 1355 130 lb (59 kg)     Height 03/21/23 1355 5\' 7"  (1.702 m)     Head Circumference --      Peak Flow --      Pain Score 03/21/23 1355 8     Pain Loc --      Pain Edu? --      Excl. in GC? --     Most recent vital signs: Vitals:   03/21/23 2345 03/22/23 0000  BP: 115/62 124/68  Pulse: (!) 130 (!) 114  Resp: 16 (!) 23  Temp: 99.1 F (37.3 C)   SpO2: 98% 95%     General: Awake, interactive  CV:  Regular rate, good peripheral perfusion.  Resp:  Lungs clear, unlabored respirations.  Abd:  Soft, nondistended, focal tenderness to palpation of the right lower quadrant without rebound or guarding, remainder of abdomen without significant tenderness Neuro:  Symmetric facial movement, fluid speech, moves extremity spontaneously and equally   ED Results / Procedures / Treatments   Labs (all labs ordered are listed, but only abnormal  results are displayed) Labs Reviewed  CBC - Abnormal; Notable for the following components:      Result Value   WBC 13.3 (*)    RBC 5.46 (*)    MCV 75.1 (*)    MCH 23.1 (*)    RDW 17.3 (*)    All other components within normal limits  BASIC METABOLIC PANEL - Abnormal; Notable for the following components:   CO2 21 (*)    Glucose, Bld 120 (*)    BUN 22 (*)    Creatinine, Ser 1.14 (*)    Calcium 8.2 (*)    GFR, Estimated 58 (*)    All other components within normal limits  URINALYSIS, W/ REFLEX TO CULTURE (INFECTION SUSPECTED) - Abnormal; Notable for the following components:   Color, Urine YELLOW (*)    APPearance HAZY (*)    Specific Gravity, Urine >1.046 (*)    Hgb urine dipstick SMALL (*)    Protein, ur 100 (*)    Nitrite POSITIVE (*)    Leukocytes,Ua SMALL (*)    Bacteria, UA RARE (*)    All other components within normal limits  PROTIME-INR - Abnormal; Notable for the following components:  Prothrombin Time 20.7 (*)    INR 1.8 (*)    All other components within normal limits  TROPONIN I (HIGH SENSITIVITY) - Abnormal; Notable for the following components:   Troponin I (High Sensitivity) 19 (*)    All other components within normal limits  TROPONIN I (HIGH SENSITIVITY) - Abnormal; Notable for the following components:   Troponin I (High Sensitivity) 51 (*)    All other components within normal limits  URINE CULTURE  RESP PANEL BY RT-PCR (RSV, FLU A&B, COVID)  RVPGX2  CULTURE, BLOOD (ROUTINE X 2)  CULTURE, BLOOD (ROUTINE X 2)  LIPASE, BLOOD  HEPATIC FUNCTION PANEL  LACTIC ACID, PLASMA  LACTIC ACID, PLASMA  HIV ANTIBODY (ROUTINE TESTING W REFLEX)  COMPREHENSIVE METABOLIC PANEL  CBC  PROTIME-INR  TYPE AND SCREEN     EKG EKG independently reviewed interpreted by myself (ER attending) demonstrates:  EKG demonstrates sinus tachycardia at a rate of 134, PR 140, QRS 60, QTc 433, nonspecific ST changes noted  RADIOLOGY Imaging independently reviewed and  interpreted by myself demonstrates:  CXR without focal pneumonia CT abdomen pelvis with right kidney abnormality concerning for pyelonephritis versus neoplasm, in clinical setting, suspect that this is likely reflective of pyelonephritis  PROCEDURES:  Critical Care performed: Yes, see critical care procedure note(s)  CRITICAL CARE Performed by: Trinna Post   Total critical care time: 38 minutes  Critical care time was exclusive of separately billable procedures and treating other patients.  Critical care was necessary to treat or prevent imminent or life-threatening deterioration.  Critical care was time spent personally by me on the following activities: development of treatment plan with patient and/or surrogate as well as nursing, discussions with consultants, evaluation of patient's response to treatment, examination of patient, obtaining history from patient or surrogate, ordering and performing treatments and interventions, ordering and review of laboratory studies, ordering and review of radiographic studies, pulse oximetry and re-evaluation of patient's condition.   Procedures   MEDICATIONS ORDERED IN ED: Medications  cefTRIAXone (ROCEPHIN) 2 g in sodium chloride 0.9 % 100 mL IVPB (0 g Intravenous Stopped 03/21/23 2244)  sodium chloride flush (NS) 0.9 % injection 3 mL (3 mLs Intravenous Given 03/21/23 2342)  0.9 %  sodium chloride infusion ( Intravenous New Bag/Given 03/21/23 2345)  morphine (PF) 2 MG/ML injection 2 mg (has no administration in time range)  Warfarin - Pharmacist Dosing Inpatient (has no administration in time range)  warfarin (COUMADIN) tablet 5 mg (has no administration in time range)  warfarin (COUMADIN) tablet 2.5 mg (has no administration in time range)  atorvastatin (LIPITOR) tablet 10 mg (10 mg Oral Given 03/21/23 2340)  aspirin EC tablet 81 mg (81 mg Oral Given 03/21/23 2341)  escitalopram (LEXAPRO) tablet 20 mg (20 mg Oral Given 03/21/23 2340)  fluticasone  (FLONASE) 50 MCG/ACT nasal spray 2 spray (has no administration in time range)  metoprolol tartrate (LOPRESSOR) tablet 12.5 mg (12.5 mg Oral Given 03/21/23 2341)  ondansetron (ZOFRAN) injection 4 mg (4 mg Intravenous Given 03/21/23 1557)  sodium chloride 0.9 % bolus 1,000 mL (0 mLs Intravenous Stopped 03/21/23 2023)  morphine (PF) 4 MG/ML injection 4 mg (4 mg Intravenous Given 03/21/23 1557)  iohexol (OMNIPAQUE) 300 MG/ML solution 100 mL (100 mLs Intravenous Contrast Given 03/21/23 1613)  sodium chloride 0.9 % bolus 1,000 mL (0 mLs Intravenous Stopped 03/21/23 2100)  acetaminophen (TYLENOL) tablet 650 mg (650 mg Oral Given 03/21/23 2152)  morphine (PF) 4 MG/ML injection 4 mg (4 mg Intravenous Given 03/21/23 2153)  lactated ringers bolus 1,000 mL (0 mLs Intravenous Stopped 03/21/23 2341)  warfarin (COUMADIN) tablet 7.5 mg (7.5 mg Oral Given 03/21/23 2342)     IMPRESSION / MDM / ASSESSMENT AND PLAN / ED COURSE  I reviewed the triage vital signs and the nursing notes.  Differential diagnosis includes, but is not limited to, acute appendicitis, diverticulitis, other acute intra-abdominal process, viral illness, overall lower suspicion for emergent pathology of her headache given history of similar and no focal deficits  Patient's presentation is most consistent with acute presentation with potential threat to life or bodily function.  52 year old female presenting with weakness with multiple associated symptoms.  Presents with sinus tachycardia.  Labs, CT abdomen pelvis ordered to further evaluate.  Do not think there is an indication for head imaging currently.  Will treat symptomatically with IV fluids, morphine, Zofran. Clinical Course as of 03/22/23 0017  Wed Mar 21, 2023  1729 Notified by RN that patient had onset of chest pain a few minutes ago with associated shortness of breath.  Will obtain repeat EKG, add on a troponin to prior blood work and obtain a new collection now, and obtain a chest x-Tansy Lorek.   CT concerning for possible pyelonephritis, patient will work on providing a urine sample. [NR]    Clinical Course User Index [NR] Trinna Post, MD   Workup demonstrates leukocytosis WC of 13.3, urine concerning for infection with positive nitrite, greater than 50 white blood cells, rare bacteria.  Urine culture sent.  Initial troponin 16, did uptrend to 51 on repeat.  BMP with mild renal impairment with creatinine of 1.14.  LFTs reassuring.  On reevaluation, patient reports improvement in her chest pain.  Suspect troponin may be demand related in the setting of her persistent tachycardia.  Has received 2 L of fluid, remains tachycardic in the 120s.  Will order an additional liter of fluid as well as Rocephin for her pyelonephritis.  Patient updated on the results of her workup.  Discussed admission and patient is comfortable this plan.  Will reach out to hospitalist team.  Case reviewed with hospitalist team.  Patient admitted for further management.  FINAL CLINICAL IMPRESSION(S) / ED DIAGNOSES   Final diagnoses:  Pyelonephritis  Acute nonintractable headache, unspecified headache type  Tachycardia     Rx / DC Orders   ED Discharge Orders     None        Note:  This document was prepared using Dragon voice recognition software and may include unintentional dictation errors.   Trinna Post, MD 03/22/23 [phone]

## 2023-03-21 NOTE — Progress Notes (Signed)
ANTICOAGULATION CONSULT NOTE - Initial Consult  Pharmacy Consult for Warfarin  Indication:  mechanical valve  Allergies  Allergen Reactions   Baclofen Hives   Dilaudid [Hydromorphone] Itching   Oxycodone Hcl Itching   Percocet [Oxycodone-Acetaminophen] Hives   Septra [Sulfamethoxazole-Trimethoprim] Hives   Vibramycin [Doxycycline] Itching   Neurontin [Gabapentin] Rash    Patient Measurements: Height: 5\' 7"  (170.2 cm) Weight: 59 kg (130 lb) IBW/kg (Calculated) : 61.6 Heparin Dosing Weight:   Vital Signs: Temp: 100.9 F (38.3 C) (06/19 2200) Temp Source: Oral (06/19 2200) BP: 127/72 (06/19 2211) Pulse Rate: 134 (06/19 2211)  Labs: Recent Labs    03/21/23 1358 03/21/23 1448 03/21/23 1556 03/21/23 1953  HGB 12.6  --   --   --   HCT 41.0  --   --   --   PLT 252  --   --   --   LABPROT  --   --  20.7*  --   INR  --   --  1.8*  --   CREATININE  --  1.14*  --   --   TROPONINIHS  --  19*  --  51*    Estimated Creatinine Clearance: 53.8 mL/min (A) (by C-G formula based on SCr of 1.14 mg/dL (H)).   Medical History: Past Medical History:  Diagnosis Date   Abscess 06/23/2021   Achilles tendonitis 01/16/2014   Overview:   Followed by [employer].  Treated with brace and diclofenac.   Acute cystitis 09/12/2013   Last Assessment & Plan:   Formatting of this note might be different from the original.  Symptoms most consistent with acute cystitis     -- Lab testing: urine sent for culture and sensitivities  -- Antibiotic: proceed now with prescription for cipro 250 BID x 5 days (risks/benefits discussed).  -- Symptom relief: Use phenazopyridine (AZO) as needed.  Maintain hydration.  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  Follow-up with Dr. Jaynie Collins.  Watch for opportunities to increase physical activity within neuromuscular capabilities.   Chronic back pain 02/13/2018   Chronic pain syndrome 02/02/2016   Closed fracture of sacrum with routine healing 12/22/2021   COVID-19 virus infection 06/23/2021   Depression    Ear pain 03/07/2023   Herniated lumbar disc without myelopathy 10/02/2019   History of bacterial endocarditis 07/05/2022   Hypertension    Insomnia 12/08/2021   Kidney stone    Malodorous urine 05/13/2020   Last Assessment & Plan:   Formatting of this note might be different from the original.  Given no dysuria, urgency and a negative UA, this is unlikely a UTI. Patient advised to drink more water.   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   Transaminitis 06/23/2021   Urinary incontinence 06/03/2010   Vertebral osteomyelitis (HCC)     Medications:  (Not in a hospital admission)   Assessment: Pharmacy consulted to dose warfarin in this 52 year old female with mechanical valve replacement.   Pt was on warfarin 2.5 mg PO every Monday and warfarin 5 mg PO all other days.  Last dose 6/18 @ 0900.  6/19:  INR @ 1556 = 1.8  Goal of Therapy:  INR :  2.5 - 3.5    Plan:  6/19:  INR @ 1556 = 1.8 - last dose of warfarin on 6/18 @ 0900.  Pt has not had 6/19 dose so will order warfarin 7.5 mg PO STAT for 6/19 @ 2330.  - Will resume home warfarin dosing schedule :    Warfarin 2.5 mg PO every Monday    Warfarin 5 mg PO Tues, Wed, Thurs, Fri, Sat, Sun  - Will recheck INR on 6/20 with AM labs.   Mary Chambers 03/21/2023,11:07 PM

## 2023-03-21 NOTE — ED Triage Notes (Signed)
Pt to ED [facility] from home for h/a, n/v, chills started this am.

## 2023-03-21 NOTE — Hospital Course (Signed)
Pt had chills and woke up to her bp being 80/40. Pt says hse hasd headache.  Also nause and vomiting.   Troponin elevation.  Sepsis. Unext gi eval in 2027.  Pt is on coumnadin.

## 2023-03-22 ENCOUNTER — Inpatient Hospital Stay: Payer: Medicaid Other

## 2023-03-22 ENCOUNTER — Encounter: Payer: Self-pay | Admitting: Internal Medicine

## 2023-03-22 DIAGNOSIS — N10 Acute tubulo-interstitial nephritis: Secondary | ICD-10-CM | POA: Diagnosis present

## 2023-03-22 DIAGNOSIS — Z952 Presence of prosthetic heart valve: Secondary | ICD-10-CM

## 2023-03-22 DIAGNOSIS — K5730 Diverticulosis of large intestine without perforation or abscess without bleeding: Secondary | ICD-10-CM | POA: Diagnosis present

## 2023-03-22 DIAGNOSIS — Z8249 Family history of ischemic heart disease and other diseases of the circulatory system: Secondary | ICD-10-CM | POA: Diagnosis not present

## 2023-03-22 DIAGNOSIS — K219 Gastro-esophageal reflux disease without esophagitis: Secondary | ICD-10-CM | POA: Diagnosis present

## 2023-03-22 DIAGNOSIS — B9620 Unspecified Escherichia coli [E. coli] as the cause of diseases classified elsewhere: Secondary | ICD-10-CM

## 2023-03-22 DIAGNOSIS — E785 Hyperlipidemia, unspecified: Secondary | ICD-10-CM | POA: Diagnosis present

## 2023-03-22 DIAGNOSIS — Z8616 Personal history of COVID-19: Secondary | ICD-10-CM | POA: Diagnosis not present

## 2023-03-22 DIAGNOSIS — R7881 Bacteremia: Secondary | ICD-10-CM | POA: Diagnosis not present

## 2023-03-22 DIAGNOSIS — N179 Acute kidney failure, unspecified: Secondary | ICD-10-CM | POA: Diagnosis present

## 2023-03-22 DIAGNOSIS — A4151 Sepsis due to Escherichia coli [E. coli]: Secondary | ICD-10-CM | POA: Diagnosis present

## 2023-03-22 DIAGNOSIS — Z79899 Other long term (current) drug therapy: Secondary | ICD-10-CM | POA: Diagnosis not present

## 2023-03-22 DIAGNOSIS — I10 Essential (primary) hypertension: Secondary | ICD-10-CM | POA: Diagnosis present

## 2023-03-22 DIAGNOSIS — G43909 Migraine, unspecified, not intractable, without status migrainosus: Secondary | ICD-10-CM | POA: Diagnosis present

## 2023-03-22 DIAGNOSIS — E559 Vitamin D deficiency, unspecified: Secondary | ICD-10-CM | POA: Diagnosis present

## 2023-03-22 DIAGNOSIS — F419 Anxiety disorder, unspecified: Secondary | ICD-10-CM | POA: Diagnosis present

## 2023-03-22 DIAGNOSIS — Z888 Allergy status to other drugs, medicaments and biological substances status: Secondary | ICD-10-CM | POA: Diagnosis not present

## 2023-03-22 DIAGNOSIS — E119 Type 2 diabetes mellitus without complications: Secondary | ICD-10-CM | POA: Diagnosis present

## 2023-03-22 DIAGNOSIS — N12 Tubulo-interstitial nephritis, not specified as acute or chronic: Secondary | ICD-10-CM | POA: Diagnosis present

## 2023-03-22 DIAGNOSIS — D509 Iron deficiency anemia, unspecified: Secondary | ICD-10-CM | POA: Diagnosis present

## 2023-03-22 DIAGNOSIS — G894 Chronic pain syndrome: Secondary | ICD-10-CM | POA: Diagnosis present

## 2023-03-22 DIAGNOSIS — R6520 Severe sepsis without septic shock: Secondary | ICD-10-CM

## 2023-03-22 DIAGNOSIS — I2489 Other forms of acute ischemic heart disease: Secondary | ICD-10-CM | POA: Diagnosis present

## 2023-03-22 DIAGNOSIS — R519 Headache, unspecified: Secondary | ICD-10-CM | POA: Diagnosis not present

## 2023-03-22 DIAGNOSIS — F32A Depression, unspecified: Secondary | ICD-10-CM | POA: Diagnosis present

## 2023-03-22 DIAGNOSIS — Z7901 Long term (current) use of anticoagulants: Secondary | ICD-10-CM | POA: Diagnosis not present

## 2023-03-22 DIAGNOSIS — Z6841 Body Mass Index (BMI) 40.0 and over, adult: Secondary | ICD-10-CM | POA: Diagnosis not present

## 2023-03-22 DIAGNOSIS — R7989 Other specified abnormal findings of blood chemistry: Secondary | ICD-10-CM | POA: Diagnosis not present

## 2023-03-22 LAB — COMPREHENSIVE METABOLIC PANEL
ALT: 60 U/L — ABNORMAL HIGH (ref 0–44)
AST: 104 U/L — ABNORMAL HIGH (ref 15–41)
Albumin: 2.9 g/dL — ABNORMAL LOW (ref 3.5–5.0)
Alkaline Phosphatase: 111 U/L (ref 38–126)
Anion gap: 5 (ref 5–15)
BUN: 20 mg/dL (ref 6–20)
CO2: 24 mmol/L (ref 22–32)
Calcium: 7.7 mg/dL — ABNORMAL LOW (ref 8.9–10.3)
Chloride: 111 mmol/L (ref 98–111)
Creatinine, Ser: 1.1 mg/dL — ABNORMAL HIGH (ref 0.44–1.00)
GFR, Estimated: >60 mL/min (ref >60)
Glucose, Bld: 116 mg/dL — ABNORMAL HIGH (ref 70–99)
Potassium: 4.3 mmol/L (ref 3.5–5.1)
Sodium: 140 mmol/L (ref 135–145)
Total Bilirubin: 0.7 mg/dL (ref 0.3–1.2)
Total Protein: 5.7 g/dL — ABNORMAL LOW (ref 6.5–8.1)

## 2023-03-22 LAB — CBC
HCT: 34.2 % — ABNORMAL LOW (ref 36.0–46.0)
Hemoglobin: 10.2 g/dL — ABNORMAL LOW (ref 12.0–15.0)
MCH: 23.3 pg — ABNORMAL LOW (ref 26.0–34.0)
MCHC: 29.8 g/dL — ABNORMAL LOW (ref 30.0–36.0)
MCV: 78.1 fL — ABNORMAL LOW (ref 80.0–100.0)
Platelets: 169 10*3/uL (ref 150–400)
RBC: 4.38 MIL/uL (ref 3.87–5.11)
RDW: 17 % — ABNORMAL HIGH (ref 11.5–15.5)
WBC: 10.4 10*3/uL (ref 4.0–10.5)
nRBC: 0 % (ref 0.0–0.2)

## 2023-03-22 LAB — BLOOD CULTURE ID PANEL (REFLEXED) - BCID2

## 2023-03-22 LAB — FOLATE: Folate: 16.7 ng/mL (ref >5.9)

## 2023-03-22 LAB — MAGNESIUM: Magnesium: 1.9 mg/dL (ref 1.7–2.4)

## 2023-03-22 LAB — PROTIME-INR
INR: 1.9 — ABNORMAL HIGH (ref 0.8–1.2)
Prothrombin Time: 21.7 seconds — ABNORMAL HIGH (ref 11.4–15.2)

## 2023-03-22 LAB — CULTURE, BLOOD (ROUTINE X 2): Special Requests: ADEQUATE

## 2023-03-22 LAB — VITAMIN B12: Vitamin B-12: 537 pg/mL (ref 180–914)

## 2023-03-22 LAB — TROPONIN I (HIGH SENSITIVITY)
Troponin I (High Sensitivity): 56 ng/L — ABNORMAL HIGH (ref <18)
Troponin I (High Sensitivity): 22 ng/L — ABNORMAL HIGH (ref <18)

## 2023-03-22 LAB — VITAMIN D 25 HYDROXY (VIT D DEFICIENCY, FRACTURES): Vit D, 25-Hydroxy: 24.58 ng/mL — ABNORMAL LOW (ref 30–100)

## 2023-03-22 LAB — LACTIC ACID, PLASMA: Lactic Acid, Venous: 1.7 mmol/L (ref 0.5–1.9)

## 2023-03-22 LAB — IRON AND TIBC
Iron: 8 ug/dL — ABNORMAL LOW (ref 28–170)
Saturation Ratios: 2 % — ABNORMAL LOW (ref 10.4–31.8)
TIBC: 357 ug/dL (ref 250–450)
UIBC: 349 ug/dL

## 2023-03-22 LAB — RESP PANEL BY RT-PCR (RSV, FLU A&B, COVID)  RVPGX2
Influenza A by PCR: NEGATIVE
Influenza B by PCR: NEGATIVE
Resp Syncytial Virus by PCR: NEGATIVE
SARS Coronavirus 2 by RT PCR: NEGATIVE

## 2023-03-22 LAB — PHOSPHORUS: Phosphorus: 4.3 mg/dL (ref 2.5–4.6)

## 2023-03-22 LAB — HIV ANTIBODY (ROUTINE TESTING W REFLEX): HIV Screen 4th Generation wRfx: NONREACTIVE

## 2023-03-22 MED ORDER — VITAMIN C 500 MG PO TABS
500.0000 mg | ORAL_TABLET | Freq: Every day | ORAL | Status: DC
  Administered 2023-03-22 – 2023-03-24 (×3): 500 mg via ORAL
  Filled 2023-03-22 (×3): qty 1

## 2023-03-22 MED ORDER — ONDANSETRON HCL 4 MG/2ML IJ SOLN: 4.0000 mg | Freq: Four times a day (QID) | INTRAMUSCULAR | Status: DC | PRN

## 2023-03-22 MED ORDER — GADOBUTROL 1 MMOL/ML IV SOLN
6.0000 mL | Freq: Once | INTRAVENOUS | Status: AC | PRN
  Administered 2023-03-22: 6 mL via INTRAVENOUS

## 2023-03-22 MED ORDER — ACETAMINOPHEN 325 MG PO TABS
650.0000 mg | ORAL_TABLET | Freq: Four times a day (QID) | ORAL | Status: DC | PRN
  Administered 2023-03-23 – 2023-03-24 (×3): 650 mg via ORAL
  Filled 2023-03-22 (×3): qty 2

## 2023-03-22 MED ORDER — LORAZEPAM 2 MG/ML IJ SOLN
2.0000 mg | INTRAMUSCULAR | Status: DC | PRN
  Administered 2023-03-22: 2 mg via INTRAVENOUS
  Filled 2023-03-22: qty 1

## 2023-03-22 MED ORDER — PANTOPRAZOLE SODIUM 40 MG IV SOLR
40.0000 mg | Freq: Two times a day (BID) | INTRAVENOUS | Status: DC
  Administered 2023-03-22 – 2023-03-23 (×4): 40 mg via INTRAVENOUS
  Filled 2023-03-22 (×4): qty 10

## 2023-03-22 MED ORDER — VITAMIN D (ERGOCALCIFEROL) 1.25 MG (50000 UNIT) PO CAPS
50000.0000 [IU] | ORAL_CAPSULE | ORAL | Status: DC
  Administered 2023-03-22: 50000 [IU] via ORAL
  Filled 2023-03-22 (×2): qty 1

## 2023-03-22 MED ORDER — POLYSACCHARIDE IRON COMPLEX 150 MG PO CAPS
150.0000 mg | ORAL_CAPSULE | Freq: Every day | ORAL | Status: DC
  Administered 2023-03-22 – 2023-03-24 (×3): 150 mg via ORAL
  Filled 2023-03-22 (×4): qty 1

## 2023-03-22 NOTE — Plan of Care (Signed)

## 2023-03-22 NOTE — H&P (Signed)
History and Physical    Patient: Mary Chambers ZOX:[id number] DOB: 12/05/1970 DOA: 03/21/2023 DOS: the patient was seen and examined on 03/22/2023 PCP: de [place], Buren Kos, MD  Patient coming from: Home  Chief Complaint:  Chief Complaint  Patient presents with   Headache    HPI: Mary Chambers is a 52 y.o. 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 Pt states she woke up with fever and chills  and later found out her BP was low at 80 systolic. She also reports intermittent Irritable bowel with diarrhea no blood noted in stool.Pt has not had follow up with GI since her last Documented scope in 2022.  Patient eats that she has had nausea and vomiting and chills. In the emergency room patient meets sepsis criteria heart rate of 129 respirations of 23 leukocytosis 13.3 mild troponin elevation from 19-51, nitrite positive urine, mild AKI with a serum creatinine of 1.14 EGFR 58 normal LFTs.  Lactic acid of 1.5 and repeat of 1.7.  Head CT done in the emergency room is negative, CT abdomen and pelvis with contrast showed an area in the right kidney about 3.1 cm that is hypodense concerning for pyelonephritis or neoplasm with recommendations for MRI.  Chart review shows patient has chronic anemia that has been evaluated by GI and patient has been found to have peptic ulcer disease and needs follow-up.  Admission requested for fevers nausea vomiting and chills and pyelonephritis less likely neoplasm with current clinical presentation..  EKG shows sinus tach at 134 PR 140 QTc 433.  Chest x-ray negative for any acute findings.  Cultures collected in the emergency room and patient started on Rocephin.  Review of Systems: Review of Systems  Constitutional:  Positive for chills and fever.  Gastrointestinal:  Positive for nausea and vomiting.  All other systems  reviewed and are negative.    Past Medical History:  Diagnosis Date   Abscess 06/23/2021   Achilles tendonitis 01/16/2014   Overview:   Followed by [employer].  Treated with brace and diclofenac.   Acute cystitis 09/12/2013   Last Assessment & Plan:   Formatting of this note might be different from the original.  Symptoms most consistent with acute cystitis     -- Lab testing: urine sent for culture and sensitivities  -- Antibiotic: proceed now with prescription for cipro 250 BID x 5 days (risks/benefits discussed).  -- Symptom relief: Use phenazopyridine (AZO) as needed.  Maintain hydration.  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  Follow-up with Dr. Jaynie Collins.  Watch for opportunities to increase physical activity within neuromuscular capabilities.   Chronic back pain 02/13/2018   Chronic pain syndrome 02/02/2016   Closed fracture of sacrum with routine healing 12/22/2021   COVID-19 virus infection 06/23/2021   Depression    Ear pain 03/07/2023   Herniated lumbar disc without myelopathy 10/02/2019   History of bacterial endocarditis 07/05/2022   Hypertension    Insomnia 12/08/2021   Kidney stone  Malodorous urine 05/13/2020   Last Assessment & Plan:   Formatting of this note might be different from the original.  Given no dysuria, urgency and a negative UA, this is unlikely a UTI. Patient advised to drink more water.   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   Transaminitis 06/23/2021   Urinary incontinence 06/03/2010   Vertebral osteomyelitis (HCC)    Past Surgical History:  Procedure  Laterality Date   ABDOMINAL HYSTERECTOMY     AORTIC VALVE REPLACEMENT N/A 11/07/2021   Procedure: AORTIC VALVE REPLACEMENT USING A On-X AORTIC VALVE.;  Surgeon: Corliss Skains, MD;  Location: [facility] OR;  Service: Open Heart Surgery;  Laterality: N/A;   BACK SURGERY     BUBBLE STUDY  08/31/2021   Procedure: BUBBLE STUDY;  Surgeon: Meriam Sprague, MD;  Location: [facility] ENDOSCOPY;  Service: Cardiovascular;;   CARPAL TUNNEL RELEASE     CHOLECYSTECTOMY     IR FLUORO GUIDED NEEDLE PLC ASPIRATION/INJECTION LOC  06/23/2021   LITHOTRIPSY     neck fusion     RIGHT/LEFT HEART CATH AND CORONARY ANGIOGRAPHY N/A 10/07/2021   Procedure: RIGHT/LEFT HEART CATH AND CORONARY ANGIOGRAPHY;  Surgeon: Corky Crafts, MD;  Location: [facility] INVASIVE CV LAB;  Service: Cardiovascular;  Laterality: N/A;   TEE WITHOUT CARDIOVERSION N/A 06/28/2021   Procedure: TRANSESOPHAGEAL ECHOCARDIOGRAM (TEE);  Surgeon: Lewayne Bunting, MD;  Location: [facility] ENDOSCOPY;  Service: Cardiovascular;  Laterality: N/A;   TEE WITHOUT CARDIOVERSION N/A 08/31/2021   Procedure: TRANSESOPHAGEAL ECHOCARDIOGRAM (TEE);  Surgeon: Meriam Sprague, MD;  Location: [facility] ENDOSCOPY;  Service: Cardiovascular;  Laterality: N/A;   TEE WITHOUT CARDIOVERSION N/A 11/07/2021   Procedure: TRANSESOPHAGEAL ECHOCARDIOGRAM (TEE);  Surgeon: Corliss Skains, MD;  Location: [facility] OR;  Service: Open Heart Surgery;  Laterality: N/A;   TUBAL LIGATION     Social History:  reports that she has never smoked. She has never used smokeless tobacco. She reports that she does not drink alcohol and does not use drugs.  Allergies  Allergen Reactions   Baclofen Hives   Dilaudid [Hydromorphone] Itching   Oxycodone Hcl Itching   Percocet [Oxycodone-Acetaminophen] Hives   Septra [Sulfamethoxazole-Trimethoprim] Hives   Vibramycin [Doxycycline] Itching   Neurontin [Gabapentin] Rash    Family History  Problem Relation Age of Onset   Diabetes Mother    Hypertension Mother     Cancer Father    Parkinson's disease Father    Heart disease Father    Heart disease Sister    Heart disease Brother    Colon cancer Neg Hx    Rectal cancer Neg Hx    Stomach cancer Neg Hx     Prior to Admission medications   Medication Sig Start Date End Date Taking? Authorizing Provider  amLODipine (NORVASC) 2.5 MG tablet TAKE 1 TABLET BY MOUTH EVERY DAY 11/16/22  Yes Jodelle Red, MD  Ascorbic Acid (VITAMIN C) 500 MG CAPS Take 500 mg by mouth in the morning.   Yes [provider]  aspirin EC 81 MG tablet Take 1 tablet (81 mg total) by mouth daily. Swallow whole. 08/15/22  Yes Jodelle Red, MD  atorvastatin (LIPITOR) 10 MG tablet Take 1 tablet (10 mg total) by mouth every evening. 08/15/22  Yes Jodelle Red, MD  cholecalciferol (VITAMIN D3) 25 MCG (1000 UT) tablet Take 1,000 Units by mouth in the morning.   Yes [provider]  escitalopram (LEXAPRO) 20 MG tablet TAKE 1 TABLET BY MOUTH EVERY DAY  01/18/23  Yes Novella Olive, FNP  fluticasone (FLONASE) 50 MCG/ACT nasal spray Place 2 sprays into both nostrils daily. 04/22/22  Yes Martin, Mary-Margaret, FNP  furosemide (LASIX) 20 MG tablet Take 1 tablet (20 mg total) by mouth daily. 08/15/22  Yes Jodelle Red, MD  loperamide (IMODIUM) 2 MG capsule Take 1 capsule (2 mg total) by mouth as needed for diarrhea or loose stools. 07/01/21  Yes Rhetta Mura, MD  metoprolol tartrate (LOPRESSOR) 25 MG tablet Take 0.5 tablets (12.5 mg total) by mouth 2 (two) times daily. 08/15/22  Yes Jodelle Red, MD  Multiple Vitamin (MULTIVITAMIN WITH MINERALS) TABS tablet Take 1 tablet by mouth in the morning.   Yes [provider]  naproxen (NAPROSYN) 250 MG tablet Take 1 tablet (250 mg total) by mouth 2 (two) times daily as needed. 10/20/22  Yes de [place], Raymond J, MD  pantoprazole (PROTONIX) 40 MG tablet Take 1 tablet (40 mg total) by mouth daily. 08/15/22  Yes Jodelle Red, MD  Potassium (POTASSIMIN PO) Take 1 tablet by mouth daily as needed (with lasix).   Yes [provider]  tiZANidine (ZANAFLEX) 4 MG tablet Take 1 tablet (4 mg total) by mouth at bedtime. 01/31/23  Yes Novella Olive, FNP  warfarin (COUMADIN) 5 MG tablet TAKE 1 TO 1 & 1/2 TABLETS DAILY OR AS DIRECTED BY COUMADIN CLINIC 12/18/22  Yes Jodelle Red, MD  amoxicillin (AMOXIL) 500 MG capsule Take 4 capsules (2000 mg) 30-60 min prior to dental work Patient not taking: Reported on 03/21/2023 01/24/23   Jodelle Red, MD  amoxicillin-clavulanate (AUGMENTIN) 875-125 MG tablet Take 1 tablet by mouth 2 (two) times daily. Patient not taking: Reported on 03/21/2023 03/07/23   de [place], Buren Kos, MD     Vitals:   03/22/23 0000 03/22/23 0030 03/22/23 0100 03/22/23 0130  BP: 124/68 119/62 112/61 117/62  Pulse: (!) 114 (!) 103 93 93  Resp: (!) 23 20 (!) 21 19  Temp:      TempSrc:      SpO2: 95% 96% 93% 93%  Weight:      Height:       Physical Exam Vitals and nursing note reviewed.  Constitutional:      General: She is not in acute distress. HENT:     Head: Normocephalic and atraumatic.     Right Ear: Hearing normal.     Left Ear: Hearing normal.     Nose: Nose normal. No nasal deformity.     Mouth/Throat:     Lips: Pink.     Tongue: No lesions.     Pharynx: Oropharynx is clear.  Eyes:     General: Lids are normal.     Extraocular Movements: Extraocular movements intact.  Cardiovascular:     Rate and Rhythm: Normal rate and regular rhythm.     Heart sounds: Normal heart sounds.  Pulmonary:     Effort: Pulmonary effort is normal.     Breath sounds: Normal breath sounds.  Abdominal:     General: Bowel sounds are normal. There is no distension.     Palpations: Abdomen is soft. There is no mass.     Tenderness: There is no abdominal tenderness.  Musculoskeletal:     Right lower leg: No edema.     Left lower leg: No edema.  Skin:    General: Skin is warm.   Neurological:     General: No focal deficit present.     Mental Status: She is alert and  oriented to person, place, and time.     Cranial Nerves: Cranial nerves 2-12 are intact.  Psychiatric:        Attention and Perception: Attention normal.        Mood and Affect: Mood normal.        Speech: Speech normal.        Behavior: Behavior normal. Behavior is cooperative.      Labs on Admission: I have personally reviewed following labs and imaging studies  CBC: Recent Labs  Lab 03/21/23 1358  WBC 13.3*  HGB 12.6  HCT 41.0  MCV 75.1*  PLT 252   Basic Metabolic Panel: Recent Labs  Lab 03/21/23 1448  NA 138  K 3.6  CL 105  CO2 21*  GLUCOSE 120*  BUN 22*  CREATININE 1.14*  CALCIUM 8.2*   GFR: Estimated Creatinine Clearance: 53.8 mL/min (A) (by C-G formula based on SCr of 1.14 mg/dL (H)). Liver Function Tests: Recent Labs  Lab 03/21/23 1448  AST 23  ALT 20  ALKPHOS 80  BILITOT 1.0  PROT 7.4  ALBUMIN 4.0   Recent Labs  Lab 03/21/23 1448  LIPASE 30   No results for input(s): "AMMONIA" in the last 168 hours. Coagulation Profile: Recent Labs  Lab 03/21/23 1556  INR 1.8*   Cardiac Enzymes: No results for input(s): "CKTOTAL", "CKMB", "CKMBINDEX", "TROPONINI" in the last 168 hours. BNP (last 3 results) No results for input(s): "PROBNP" in the last 8760 hours. HbA1C: No results for input(s): "HGBA1C" in the last 72 hours. CBG: No results for input(s): "GLUCAP" in the last 168 hours. Lipid Profile: No results for input(s): "CHOL", "HDL", "LDLCALC", "TRIG", "CHOLHDL", "LDLDIRECT" in the last 72 hours. Thyroid Function Tests: No results for input(s): "TSH", "T4TOTAL", "FREET4", "T3FREE", "THYROIDAB" in the last 72 hours. Anemia Panel: No results for input(s): "VITAMINB12", "FOLATE", "FERRITIN", "TIBC", "IRON", "RETICCTPCT" in the last 72 hours. Urine analysis:    Component Value Date/Time   COLORURINE YELLOW (A) 03/21/2023 1514   APPEARANCEUR HAZY (A)  03/21/2023 1514   APPEARANCEUR Cloudy 09/09/2014 1856   LABSPEC >1.046 (H) 03/21/2023 1514   LABSPEC 1.019 09/09/2014 1856   PHURINE 6.0 03/21/2023 1514   GLUCOSEU NEGATIVE 03/21/2023 1514   GLUCOSEU Negative 09/09/2014 1856   HGBUR SMALL (A) 03/21/2023 1514   BILIRUBINUR NEGATIVE 03/21/2023 1514   BILIRUBINUR negative 09/20/2020 1738   BILIRUBINUR Negative 09/09/2014 1856   KETONESUR NEGATIVE 03/21/2023 1514   PROTEINUR 100 (A) 03/21/2023 1514   UROBILINOGEN 0.2 09/20/2020 1738   UROBILINOGEN 0.2 04/16/2019 2015   NITRITE POSITIVE (A) 03/21/2023 1514   LEUKOCYTESUR SMALL (A) 03/21/2023 1514   LEUKOCYTESUR 3+ 09/09/2014 1856    Radiological Exams on Admission: CT Head Wo Contrast  Result Date: 03/21/2023 CLINICAL DATA:  Headache EXAM: CT HEAD WITHOUT CONTRAST TECHNIQUE: Contiguous axial images were obtained from the base of the skull through the vertex without intravenous contrast. RADIATION DOSE REDUCTION: This exam was performed according to the departmental dose-optimization program which includes automated exposure control, adjustment of the mA and/or kV according to patient size and/or use of iterative reconstruction technique. COMPARISON:  03/06/2023 FINDINGS: Brain: There are streak artifacts partially obscuring the posterior fossa, caused by the earrings. There is no mass, hemorrhage or extra-axial collection. The size and configuration of the ventricles and extra-axial CSF spaces are normal. The brain parenchyma is normal, without acute or chronic infarction. Vascular: No abnormal hyperdensity of the major intracranial arteries or dural venous sinuses. No intracranial atherosclerosis. Skull: The visualized skull  base, calvarium and extracranial soft tissues are normal. Sinuses/Orbits: No fluid levels or advanced mucosal thickening of the visualized paranasal sinuses. No mastoid or middle ear effusion. The orbits are normal. IMPRESSION: Normal head CT. Electronically Signed   By: Deatra Robinson M.D.   On: 03/21/2023 22:14   DG Chest 1 View  Result Date: 03/21/2023 CLINICAL DATA:  Chest pain. EXAM: CHEST  1 VIEW COMPARISON:  02/14/2022 FINDINGS: Stable surgical changes with median sternotomy wires and lower thoracic spinal fusion hardware. The heart is normal in size. No acute pulmonary findings. No pleural effusions or pulmonary lesions. The bony thorax is intact. IMPRESSION: No acute cardiopulmonary findings. Electronically Signed   By: Rudie Meyer M.D.   On: 03/21/2023 17:48   CT ABDOMEN PELVIS W CONTRAST  Result Date: 03/21/2023 CLINICAL DATA:  Right lower quadrant pain EXAM: CT ABDOMEN AND PELVIS WITH CONTRAST TECHNIQUE: Multidetector CT imaging of the abdomen and pelvis was performed using the standard protocol following bolus administration of intravenous contrast. RADIATION DOSE REDUCTION: This exam was performed according to the departmental dose-optimization program which includes automated exposure control, adjustment of the mA and/or kV according to patient size and/or use of iterative reconstruction technique. CONTRAST:  OMNIPAQUE IOHEXOL 300 MG/ML  SOLN COMPARISON:  CT abdomen and pelvis 01/17/2023 FINDINGS: Lower chest: No acute abnormality. Hepatobiliary: No focal liver abnormality is seen. Status post cholecystectomy. No biliary dilatation. Pancreas: Unremarkable. No pancreatic ductal dilatation or surrounding inflammatory changes. Spleen: Normal in size without focal abnormality. Adrenals/Urinary Tract: Left renal atrophy is unchanged. There is cortical scarring in both kidneys. There is no hydronephrosis or urinary tract calculus identified. Vague focal hypodense area seen in the inferior pole the right kidney measuring 2.7 x 3.1 cm image 2/50. The adrenal glands and bladder are within normal limits. Stomach/Bowel: Stomach is within normal limits. Appendix appears normal. No evidence of bowel wall thickening, distention, or inflammatory changes. There is sigmoid  colon diverticulosis. Vascular/Lymphatic: No significant vascular findings are present. No enlarged abdominal or pelvic lymph nodes. Reproductive: Status post hysterectomy. No adnexal masses. Other: No abdominal wall hernia or abnormality. No abdominopelvic ascites. Musculoskeletal: Posterior fusion hardware seen at L1-L2 and T10-T11. IMPRESSION: 1. Vague hypodense area in the inferior pole of the right kidney measuring 3.1 cm. Findings may represent focal pyelonephritis or renal neoplasm. Further evaluation with MRI recommended. 2. No other acute localizing process in the abdomen or pelvis. 3. Stable left renal atrophy. 4. Sigmoid colon diverticulosis. Electronically Signed   By: Darliss Cheney M.D.   On: 03/21/2023 16:26     Data Reviewed: Relevant notes from primary care and specialist visits, past discharge summaries as available in EHR, including Care Everywhere. Prior diagnostic testing as pertinent to current admission diagnoses Updated medications and problem lists for reconciliation ED course, including vitals, labs, imaging, treatment and response to treatment Triage notes, nursing and pharmacy notes and ED provider's notes Notable results as noted in HPI Assessment and Plan: >> Headache: Secondary to patient's acute infection and fever. Head CT negative no falls and no trauma.  >> Nausea vomiting chills: Secondary to patient's pyelonephritis. Gentle IV fluid hydration overnight. Antiemetics, antipyretics.  >> UTI pyelonephritis/sepsis: Continue with IV fluids With Rocephin Culture and blood cultures.  >> Morbid obesity will obtain A1c.  Suspect patient has underlying diabetes  >> Status post aortic valve replacement: Patient is currently on Coumadin and pharmacy consulted.  >> Anemia/history of gastric ulcer: Patient has been chronically anemic and hemoglobin is currently stable. IV  PPI.  Type and screen, stool occult, discussed with patient that she needs follow-up with GI  and hematology.    DVT prophylaxis:  Coumadin  Consults:  Pharmacy consult  Advance Care Planning:    Code Status: Full Code   Family Communication:  Spouse at bedside  Disposition Plan:  Back to previous home environment  Severity of Illness: The appropriate patient status for this patient is INPATIENT. Inpatient status is judged to be reasonable and necessary in order to provide the required intensity of service to ensure the patient's safety. The patient's presenting symptoms, physical exam findings, and initial radiographic and laboratory data in the context of their chronic comorbidities is felt to place them at high risk for further clinical deterioration. Furthermore, it is not anticipated that the patient will be medically stable for discharge from the hospital within 2 midnights of admission.   * I certify that at the point of admission it is my clinical judgment that the patient will require inpatient hospital care spanning beyond 2 midnights from the point of admission due to high intensity of service, high risk for further deterioration and high frequency of surveillance required.*  Author: Gertha Calkin, MD 03/22/2023 2:19 AM  For on call review [email or web address].

## 2023-03-22 NOTE — Progress Notes (Addendum)
[facility] Progress Note  Patient: Mary Chambers    ZOX:[id number]  DOA: 03/21/2023     Date of Service: the patient was seen and examined on 03/22/2023  Chief Complaint  Patient presents with   Headache   Brief hospital course: Mary Chambers is a 52 y.o. 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 Pt states she woke up with fever and chills and later found out her BP was low at 80 systolic. She also reports intermittent Irritable bowel with diarrhea no blood noted in stool.  Denies any abdominal pain, no dysuria.  ED course:patient meets sepsis criteria heart rate of 129 respirations of 23 leukocytosis 13.3 mild troponin elevation from 19-51, nitrite positive urine, mild AKI with a serum creatinine of 1.14 EGFR 58 normal LFTs. Lactic acid of 1.5 and repeat of 1.7.  Head CT done in the emergency room is negative,  CT abdomen and pelvis with contrast showed an area in the right kidney about 3.1 cm that is hypodense concerning for pyelonephritis or neoplasm with recommendations for MRI.  EKG shows sinus tach at 134 PR 140 QTc 433. Chest x-ray negative for any acute findings.  Follow blood cultures [facility] consulted for admission and further management as below.  Assessment and Plan:  # Sepsis secondary to UTI/right pyelonephritis, E. coli bacteremia UA positive, follow urine culture Blood culture growing E. Coli, follow sensitivity report Continue ceftriaxone 2 g IV daily MRI consistent with right pyelonephritis Follow ID consult repeat blood cultures tomorrow a.m.    # H/o endocarditis s/p mechanical aortic valve replacement Continue Coumadin, pharmacy consulted Monitor INR   # HTN, HLD Continue Lopressor 12.5 twice daily, Lipitor 10 mg nightly Monitor BP and titrate medications accordingly   # Elevated troponin most likely due  to demand ischemia, patient denies any chest pain Troponin 56, continue to trend until peak Continue aspirin 81 mg p.o. daily Follow 2D echocardiogram  # Transaminitis most likely due to sepsis Monitor LFTs  # Iron deficiency anemia, transferrin saturation 2%, avoided IV iron due to sepsis and pyelonephritis Started oral iron supplement with vitamin C.  Follow with PCP to repeat iron profile after 3 to 6 months  # Vitamin D deficiency: started vitamin D 50,000 units p.o. weekly, follow with PCP to repeat vitamin D level after 3 to 6 months.  # Morbid obesity Body mass index is 44.48 kg/m.  Interventions: Calorie restricted diet and daily exercise advised to lose body weight.  Lifestyle modification discussed.  Diet: Heart healthy diet DVT Prophylaxis: Therapeutic Anticoagulation with Coumadin    Advance goals of care discussion: Full code  Family Communication: family was present at bedside, at the time of interview.  The pt provided permission to discuss medical plan with the family. Opportunity was given to ask question and all questions were answered satisfactorily.   Disposition:  Pt is from Home, admitted with sepsis due to right pyelonephritis, still has bacteremia on IV antibiotics, which precludes a safe discharge. Discharge to home, when clinically stable  Subjective: No significant events overnight, patient is having mild headache, feels tired, no any other specific complaints, denies any abdominal pain, no dysuria.  Denies any chest pain or palpitation, no shortness of breath  Physical Exam: General: NAD, lying comfortably Appear in no distress, affect appropriate Eyes: PERRLA ENT: Oral Mucosa Clear, moist  Neck: no JVD,  Cardiovascular: S1 and S2 Present, no Murmur,  Respiratory: good respiratory effort, Bilateral Air entry equal and Decreased, no Crackles, no wheezes Abdomen: Bowel Sound present, Soft, obese, right flank tenderness.  Skin: no rashes Extremities:  no Pedal edema, no calf tenderness Neurologic: without any new focal findings Gait not checked due to patient safety concerns  Vitals:   03/22/23 0130 03/22/23 0500 03/22/23 0906 03/22/23 1100  BP: 117/62 129/66 123/65   Pulse: 93 70 75   Resp: 19 18 18    Temp:  98.6 F (37 C) 98.5 F (36.9 C)   TempSrc:  Oral Oral   SpO2: 93% 94% 94%   Weight:    128.8 kg  Height:        Intake/Output Summary (Last 24 hours) at 03/22/2023 1236 Last data filed at 03/21/2023 2341 Gross per 24 hour  Intake 3100 ml  Output --  Net 3100 ml   Filed Weights   03/21/23 1355 03/22/23 1100  Weight: 59 kg 128.8 kg    Data Reviewed: I have personally reviewed and interpreted daily labs, tele strips, imagings as discussed above. I reviewed all nursing notes, pharmacy notes, vitals, pertinent old records I have discussed plan of care as described above with RN and patient/family.  CBC: Recent Labs  Lab 03/21/23 1358 03/22/23 0510  WBC 13.3* 10.4  HGB 12.6 10.2*  HCT 41.0 34.2*  MCV 75.1* 78.1*  PLT 252 169   Basic Metabolic Panel: Recent Labs  Lab 03/21/23 1448 03/22/23 0510  NA 138 140  K 3.6 4.3  CL 105 111  CO2 21* 24  GLUCOSE 120* 116*  BUN 22* 20  CREATININE 1.14* 1.10*  CALCIUM 8.2* 7.7*  MG  --  1.9  PHOS  --  4.3    Studies: MR ABDOMEN W WO CONTRAST  Result Date: 03/22/2023 CLINICAL DATA:  Characterize right renal lesion identified by prior CT suspected pyelonephritis versus renal neoplasm EXAM: MRI ABDOMEN WITHOUT AND WITH CONTRAST TECHNIQUE: Multiplanar multisequence MR imaging of the abdomen was performed both before and after the administration of intravenous contrast. CONTRAST:  6mL GADAVIST GADOBUTROL 1 MMOL/ML IV SOLN COMPARISON:  CT abdomen pelvis, 03/21/2023 FINDINGS: Examination is generally limited by breath motion artifact. Lower chest: No acute abnormality. Hepatobiliary: No focal liver abnormality is seen. Status post cholecystectomy. No biliary dilatation.  Pancreas: Unremarkable. No pancreatic ductal dilatation or surrounding inflammatory changes. Spleen: Normal in size without significant abnormality. Adrenals/Urinary Tract: Adrenal glands are unremarkable. Very subtle, intrinsically T2 hyperintense, T1 hypointense lesion of the inferior pole of the right kidney, which is minimally hypoenhancing (series 4, image 22, series 22, image 43). This measures 4.2 x 3.2 cm (series 4, image 22). Bilateral perinephric fat stranding and fluid. Mildly atrophic left kidney. Simple, benign left renal cortical cysts, for which no further follow-up or characterization is required. Kidneys are otherwise normal, without obvious renal calculi, solid lesion, or hydronephrosis. Stomach/Bowel: Stomach is within normal limits. No evidence of bowel wall thickening, distention, or inflammatory changes. Vascular/Lymphatic: No significant vascular findings are present. No enlarged abdominal lymph nodes. Other: No abdominal wall hernia or abnormality. No ascites. Musculoskeletal: No acute or significant osseous findings. IMPRESSION: 1. Very subtle, minimally hypoenhancing lesion of the inferior pole of the right kidney corresponding to finding prior CT. This measures 4.2 x 3.2 cm and is most consistent with pyelonephritis. 2. Bilateral perinephric fat stranding and fluid, infectious or inflammatory. 3. Mildly atrophic left kidney. 4. No hydronephrosis. 5. Status post cholecystectomy. Electronically Signed   By: Jearld Lesch M.D.   On:  03/22/2023 11:20   CT Head Wo Contrast  Result Date: 03/21/2023 CLINICAL DATA:  Headache EXAM: CT HEAD WITHOUT CONTRAST TECHNIQUE: Contiguous axial images were obtained from the base of the skull through the vertex without intravenous contrast. RADIATION DOSE REDUCTION: This exam was performed according to the departmental dose-optimization program which includes automated exposure control, adjustment of the mA and/or kV according to patient size and/or use of  iterative reconstruction technique. COMPARISON:  03/06/2023 FINDINGS: Brain: There are streak artifacts partially obscuring the posterior fossa, caused by the earrings. There is no mass, hemorrhage or extra-axial collection. The size and configuration of the ventricles and extra-axial CSF spaces are normal. The brain parenchyma is normal, without acute or chronic infarction. Vascular: No abnormal hyperdensity of the major intracranial arteries or dural venous sinuses. No intracranial atherosclerosis. Skull: The visualized skull base, calvarium and extracranial soft tissues are normal. Sinuses/Orbits: No fluid levels or advanced mucosal thickening of the visualized paranasal sinuses. No mastoid or middle ear effusion. The orbits are normal. IMPRESSION: Normal head CT. Electronically Signed   By: Deatra Robinson M.D.   On: 03/21/2023 22:14   DG Chest 1 View  Result Date: 03/21/2023 CLINICAL DATA:  Chest pain. EXAM: CHEST  1 VIEW COMPARISON:  02/14/2022 FINDINGS: Stable surgical changes with median sternotomy wires and lower thoracic spinal fusion hardware. The heart is normal in size. No acute pulmonary findings. No pleural effusions or pulmonary lesions. The bony thorax is intact. IMPRESSION: No acute cardiopulmonary findings. Electronically Signed   By: Rudie Meyer M.D.   On: 03/21/2023 17:48   CT ABDOMEN PELVIS W CONTRAST  Result Date: 03/21/2023 CLINICAL DATA:  Right lower quadrant pain EXAM: CT ABDOMEN AND PELVIS WITH CONTRAST TECHNIQUE: Multidetector CT imaging of the abdomen and pelvis was performed using the standard protocol following bolus administration of intravenous contrast. RADIATION DOSE REDUCTION: This exam was performed according to the departmental dose-optimization program which includes automated exposure control, adjustment of the mA and/or kV according to patient size and/or use of iterative reconstruction technique. CONTRAST:  OMNIPAQUE IOHEXOL 300 MG/ML  SOLN COMPARISON:  CT  abdomen and pelvis 01/17/2023 FINDINGS: Lower chest: No acute abnormality. Hepatobiliary: No focal liver abnormality is seen. Status post cholecystectomy. No biliary dilatation. Pancreas: Unremarkable. No pancreatic ductal dilatation or surrounding inflammatory changes. Spleen: Normal in size without focal abnormality. Adrenals/Urinary Tract: Left renal atrophy is unchanged. There is cortical scarring in both kidneys. There is no hydronephrosis or urinary tract calculus identified. Vague focal hypodense area seen in the inferior pole the right kidney measuring 2.7 x 3.1 cm image 2/50. The adrenal glands and bladder are within normal limits. Stomach/Bowel: Stomach is within normal limits. Appendix appears normal. No evidence of bowel wall thickening, distention, or inflammatory changes. There is sigmoid colon diverticulosis. Vascular/Lymphatic: No significant vascular findings are present. No enlarged abdominal or pelvic lymph nodes. Reproductive: Status post hysterectomy. No adnexal masses. Other: No abdominal wall hernia or abnormality. No abdominopelvic ascites. Musculoskeletal: Posterior fusion hardware seen at L1-L2 and T10-T11. IMPRESSION: 1. Vague hypodense area in the inferior pole of the right kidney measuring 3.1 cm. Findings may represent focal pyelonephritis or renal neoplasm. Further evaluation with MRI recommended. 2. No other acute localizing process in the abdomen or pelvis. 3. Stable left renal atrophy. 4. Sigmoid colon diverticulosis. Electronically Signed   By: Darliss Cheney M.D.   On: 03/21/2023 16:26    Scheduled Meds:  vitamin C  500 mg Oral Daily   aspirin EC  81 mg  Oral Daily   atorvastatin  10 mg Oral QPM   escitalopram  20 mg Oral Daily   [START ON 03/23/2023] fluticasone  2 spray Each Nare Daily   iron polysaccharides  150 mg Oral Daily   metoprolol tartrate  12.5 mg Oral BID   pantoprazole (PROTONIX) IV  40 mg Intravenous Q12H   Vitamin D (Ergocalciferol)  50,000 Units Oral Q7  days   [START ON 03/26/2023] warfarin  2.5 mg Oral Q Mon-1800   warfarin  5 mg Oral Once per day on Sun Tue Wed Thu Fri Sat   Warfarin - Pharmacist Dosing Inpatient   Does not apply q1600   Continuous Infusions:  sodium chloride 50 mL/hr at 03/21/23 2345   cefTRIAXone (ROCEPHIN)  IV Stopped (03/21/23 2244)   PRN Meds: acetaminophen, LORazepam, morphine injection, ondansetron (ZOFRAN) IV  Time spent: 55 minutes  Author: Gillis Santa. MD [facility] 03/22/2023 12:36 PM  To reach On-call, see care teams to locate the attending and reach out to them via [email or web address]. If 7PM-7AM, please contact night-coverage If you still have difficulty reaching the attending provider, please page the [facility] (Director on Call) for [facility] on amion for assistance.

## 2023-03-22 NOTE — Progress Notes (Signed)
ANTICOAGULATION CONSULT NOTE - Initial Consult  Pharmacy Consult for Warfarin  Indication:  mechanical valve  Allergies  Allergen Reactions   Baclofen Hives   Dilaudid [Hydromorphone] Itching   Oxycodone Hcl Itching   Percocet [Oxycodone-Acetaminophen] Hives   Septra [Sulfamethoxazole-Trimethoprim] Hives   Vibramycin [Doxycycline] Itching   Neurontin [Gabapentin] Rash    Patient Measurements: Height: 5\' 7"  (170.2 cm) Weight: 59 kg (130 lb) IBW/kg (Calculated) : 61.6 Heparin Dosing Weight:   Vital Signs: Temp: 98.6 F (37 C) (06/20 0500) Temp Source: Oral (06/20 0500) BP: 129/66 (06/20 0500) Pulse Rate: 70 (06/20 0500)  Labs: Recent Labs    03/21/23 1358 03/21/23 1448 03/21/23 1556 03/21/23 1953 03/22/23 0510  HGB 12.6  --   --   --  10.2*  HCT 41.0  --   --   --  34.2*  PLT 252  --   --   --  169  LABPROT  --   --  20.7*  --  21.7*  INR  --   --  1.8*  --  1.9*  CREATININE  --  1.14*  --   --  1.10*  TROPONINIHS  --  19*  --  51*  --      Estimated Creatinine Clearance: 55.7 mL/min (A) (by C-G formula based on SCr of 1.1 mg/dL (H)).   Medical History: Past Medical History:  Diagnosis Date   Abscess 06/23/2021   Achilles tendonitis 01/16/2014   Overview:   Followed by [employer].  Treated with brace and diclofenac.   Acute cystitis 09/12/2013   Last Assessment & Plan:   Formatting of this note might be different from the original.  Symptoms most consistent with acute cystitis     -- Lab testing: urine sent for culture and sensitivities  -- Antibiotic: proceed now with prescription for cipro 250 BID x 5 days (risks/benefits discussed).  -- Symptom relief: Use phenazopyridine (AZO) as needed.  Maintain hydration.  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  Follow-up with Dr. Jaynie Collins.  Watch for opportunities to increase physical activity within neuromuscular capabilities.   Chronic back pain 02/13/2018   Chronic pain syndrome 02/02/2016   Closed fracture of sacrum with routine healing 12/22/2021   COVID-19 virus infection 06/23/2021   Depression    Ear pain 03/07/2023   Herniated lumbar disc without myelopathy 10/02/2019   History of bacterial endocarditis 07/05/2022   Hypertension    Insomnia 12/08/2021   Kidney stone    Malodorous urine 05/13/2020   Last Assessment & Plan:   Formatting of this note might be different from the original.  Given no dysuria, urgency and a negative UA, this is unlikely a UTI. Patient advised to drink more water.   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   Transaminitis 06/23/2021   Urinary incontinence 06/03/2010   Vertebral osteomyelitis (HCC)     Medications:  (Not in a hospital admission)  Assessment: Pharmacy consulted to dose warfarin in this 52 year old female with mechanical valve replacement.   Pt was on  warfarin 2.5 mg PO every Monday and warfarin 5 mg PO all other days.   Last dose 6/18 @ 0900.  DATE INR DOSE  6/19 1.8 7.5mg   6/20  1.9            Goal of Therapy:  INR: 2.0-3.0    Plan:  6/20:  INR @ 0510= 1.9 - Will resume home warfarin dosing schedule :    Warfarin 2.5 mg PO every Monday    Warfarin 5 mg PO Tues, Wed, Thurs, Fri, Sat, Sun  - Will recheck INR daily and adjust dose as needed.   Gardner Candle, PharmD, BCPS Clinical Pharmacist 03/22/2023 7:50 AM

## 2023-03-22 NOTE — Consult Note (Signed)
NAME: Mary Chambers  DOB: 1971-04-24  MRN: [id number]  Date/Time: 03/22/2023 4:21 PM  REQUESTING PROVIDER; [email or web address] Subjective:  REASON FOR CONSULT: E.coli bacteremia ? Mary Chambers is a 52 y.o. with a history T10-T11-L1-L2 fusion  , Enterococcus bacteremia , aortic valve endocarditis. L4-L5 epidural abscess discits, aortic valve replacement on 11/07/21 Presented to ED on 6/19/ with N/V/chills She had increased frequency of micturiiton for 2-3 days prior. No dysuria . Saw a small amt of blood when she wiped with tissue once , She has a h/o rt renal stone and stent 30 yrs ago. No flank pain  Vitals in the ED Temp 98.6, HR 129, BP 119/61, RR 18 and stas 94%   Latest Reference Range & Units 03/21/23 13:58  WBC 4.0 - 10.5 K/uL 13.3 (H)  Hemoglobin 12.0 - 15.0 g/dL 95.6  HCT 38.7 - 56.4 % 41.0  Platelets 150 - 400 K/uL 252  Cr 1.14 Blood culture sent, UA > 50 wbc , UC sent CT abdomen and pelvis revealed rt lower pole 3 cm hypodense area questioning focl pyelonephritis VS neoplasm Left renal atrophy Sigmoid diverticulosis  She was started on IV antibiotics BC came back as Ecoli and currently on Iv ceftriaxone  She received steroid injection to the spine area  for back pain last thursday and to the rt shoulder last friday  Past Medical History:  Diagnosis Date   Abscess 06/23/2021   Achilles tendonitis 01/16/2014   Overview:   Followed by [employer].  Treated with brace and diclofenac.   Acute cystitis 09/12/2013   Last Assessment & Plan:   Formatting of this note might be different from the original.  Symptoms most consistent with acute cystitis     -- Lab testing: urine sent for culture and sensitivities  -- Antibiotic: proceed now with prescription for cipro 250 BID x 5 days (risks/benefits discussed).  -- Symptom relief: Use phenazopyridine (AZO) as needed.  Maintain hydration.  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  Follow-up with Dr. Jaynie Collins.  Watch for opportunities to increase physical activity within neuromuscular capabilities.   Chronic back pain 02/13/2018   Chronic pain syndrome 02/02/2016   Closed fracture of sacrum with routine healing 12/22/2021   COVID-19 virus infection 06/23/2021   Depression    Ear pain 03/07/2023   Herniated lumbar disc without myelopathy 10/02/2019   History of bacterial endocarditis 07/05/2022   Hypertension    Insomnia 12/08/2021   Kidney stone    Malodorous urine 05/13/2020   Last Assessment & Plan:   Formatting of this note might be different from the original.  Given no dysuria, urgency and a negative UA, this is unlikely a UTI. Patient advised to drink more water.   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 ENDOSCOPY;  Service: Cardiovascular;;   CARPAL TUNNEL RELEASE     CHOLECYSTECTOMY     IR FLUORO GUIDED NEEDLE PLC ASPIRATION/INJECTION LOC  06/23/2021   LITHOTRIPSY     neck fusion     RIGHT/LEFT HEART CATH AND CORONARY ANGIOGRAPHY N/A 10/07/2021   Procedure: RIGHT/LEFT HEART CATH AND CORONARY ANGIOGRAPHY;  Surgeon:  Corky Crafts, MD;  Location: [facility] INVASIVE CV LAB;  Service: Cardiovascular;  Laterality: N/A;   TEE WITHOUT CARDIOVERSION N/A 06/28/2021   Procedure: TRANSESOPHAGEAL ECHOCARDIOGRAM (TEE);  Surgeon: Lewayne Bunting, MD;  Location: [facility] ENDOSCOPY;  Service: Cardiovascular;  Laterality: N/A;   TEE WITHOUT CARDIOVERSION N/A 08/31/2021   Procedure: TRANSESOPHAGEAL ECHOCARDIOGRAM (TEE);  Surgeon: Meriam Sprague, MD;  Location: [facility] ENDOSCOPY;  Service: Cardiovascular;  Laterality: N/A;   TEE WITHOUT CARDIOVERSION N/A 11/07/2021   Procedure: TRANSESOPHAGEAL ECHOCARDIOGRAM (TEE);  Surgeon: Corliss Skains, MD;  Location: [facility] OR;  Service: Open Heart Surgery;  Laterality: N/A;   TUBAL LIGATION      Social History   Socioeconomic History   Marital status: Single    Spouse name: Not on file   Number of children: Not on file   Years of education: Not on file   Highest education level: Associate degree: academic program  Occupational History   Not on file  Tobacco Use   Smoking status: Never   Smokeless tobacco: Never  Vaping Use   Vaping Use: Never used  Substance and Sexual Activity   Alcohol use: No   Drug use: Never   Sexual activity: Not Currently    Birth control/protection: None  Other Topics Concern   Not on file  Social History Narrative   Not on file   Social Determinants of Health   Financial Resource Strain: Medium Risk (03/06/2023)   Overall Financial Resource Strain (CARDIA)    Difficulty of Paying Living Expenses: Somewhat hard  Food Insecurity: Food Insecurity Present (03/22/2023)   Hunger Vital Sign    Worried About Running Out of Food in the Last Year: Often true    Ran Out of Food in the Last Year: Often true  Transportation Needs: Unmet Transportation Needs (03/22/2023)   PRAPARE - Transportation    Lack of Transportation (Medical): Yes    Lack of Transportation (Non-Medical): Yes  Physical Activity: Unknown (03/06/2023)   Exercise Vital Sign    Days of  Exercise per Week: 0 days    Minutes of Exercise per Session: Not on file  Stress: Stress Concern Present (03/06/2023)   [employer] of Occupational Health - Occupational Stress Questionnaire    Feeling of Stress : To some extent  Social Connections: Socially Isolated (03/06/2023)   Social Connection and Isolation Panel [NHANES]    Frequency of Communication with Friends and Family: Three times a week    Frequency of Social Gatherings with Friends and Family: Once a week    Attends Religious Services: Never    Database administrator or Organizations: No    Attends Banker Meetings: Not on file    Marital Status: Divorced  Intimate Partner Violence: Patient Declined (03/22/2023)   Humiliation, Afraid, Rape, and Kick questionnaire    Fear of Current or Ex-Partner: Patient declined  Emotionally Abused: Patient declined    Physically Abused: Patient declined    Sexually Abused: Patient declined    Family History  Problem Relation Age of Onset   Diabetes Mother    Hypertension Mother    Cancer Father    Parkinson's disease Father    Heart disease Father    Heart disease Sister    Heart disease Brother    Colon cancer Neg Hx    Rectal cancer Neg Hx    Stomach cancer Neg Hx    Allergies  Allergen Reactions   Baclofen Hives   Dilaudid [Hydromorphone] Itching   Oxycodone Hcl Itching   Percocet [Oxycodone-Acetaminophen] Hives   Septra [Sulfamethoxazole-Trimethoprim] Hives   Vibramycin [Doxycycline] Itching   Neurontin [Gabapentin] Rash   I? Current Facility-Administered Medications  Medication Dose Route Frequency Provider Last Rate Last Admin   0.9 %  sodium chloride infusion   Intravenous Continuous Gertha Calkin, MD 50 mL/hr at 03/21/23 2345 New Bag at 03/21/23 2345   acetaminophen (TYLENOL) tablet 650 mg  650 mg Oral Q6H PRN Gillis Santa, MD       ascorbic acid (VITAMIN C) tablet 500 mg  500 mg Oral Daily Gillis Santa, MD       aspirin EC tablet 81 mg  81  mg Oral Daily Irena Cords V, MD   81 mg at 03/22/23 1235   atorvastatin (LIPITOR) tablet 10 mg  10 mg Oral QPM Irena Cords V, MD   10 mg at 03/21/23 2340   cefTRIAXone (ROCEPHIN) 2 g in sodium chloride 0.9 % 100 mL IVPB  2 g Intravenous Q24H Gertha Calkin, MD   Stopped at 03/21/23 2244   escitalopram (LEXAPRO) tablet 20 mg  20 mg Oral Daily Irena Cords V, MD   20 mg at 03/22/23 1235   [START ON 03/23/2023] fluticasone (FLONASE) 50 MCG/ACT nasal spray 2 spray  2 spray Each Nare Daily Gertha Calkin, MD       iron polysaccharides (NIFEREX) capsule 150 mg  150 mg Oral Daily Gillis Santa, MD       LORazepam (ATIVAN) injection 2 mg  2 mg Intravenous Q5 min PRN Gillis Santa, MD   2 mg at 03/22/23 1012   metoprolol tartrate (LOPRESSOR) tablet 12.5 mg  12.5 mg Oral BID Gillis Santa, MD   12.5 mg at 03/22/23 1235   morphine (PF) 2 MG/ML injection 2 mg  2 mg Intravenous Q4H PRN Gertha Calkin, MD       ondansetron (ZOFRAN) injection 4 mg  4 mg Intravenous Q6H PRN Gillis Santa, MD       pantoprazole (PROTONIX) injection 40 mg  40 mg Intravenous Q12H Irena Cords V, MD   40 mg at 03/22/23 1235   Vitamin D (Ergocalciferol) (DRISDOL) 1.25 MG (50000 UNIT) capsule 50,000 Units  50,000 Units Oral Q7 days Gillis Santa, MD       [START ON 03/26/2023] warfarin (COUMADIN) tablet 2.5 mg  2.5 mg Oral Q Mon-1800 Irena Cords V, MD       warfarin (COUMADIN) tablet 5 mg  5 mg Oral Once per day on Sun Tue Wed Thu Fri Sat Gertha Calkin, MD       Warfarin - Pharmacist Dosing Inpatient   Does not apply [id number] Gertha Calkin, MD         Abtx:  Anti-infectives (From admission, onward)    Start     Dose/Rate Route Frequency Ordered Stop   03/21/23 2130  cefTRIAXone (  ROCEPHIN) 2 g in sodium chloride 0.9 % 100 mL IVPB        2 g 200 mL/hr over 30 Minutes Intravenous Every 24 hours 03/21/23 2124 03/28/23 2159       REVIEW OF SYSTEMS:  Const:  fever, chills, negative weight loss Eyes: negative diplopia or visual changes,  negative eye pain ENT: negative coryza, negative sore throat Resp: negative cough, hemoptysis, dyspnea Cards: negative for chest pain, palpitations, lower extremity edema GU:  frequency,  GI: Negative for abdominal pain, diarrhea, bleeding, constipation Skin: negative for rash and pruritus Heme: negative for easy bruising and gum/nose bleeding MS:  muscle weakness Neurolo:negative for headaches, dizziness, vertigo, memory problems  Psych: negative for feelings of anxiety, depression  Endocrine: negative for thyroid, diabetes Allergy/Immunology- negative for any medication or food allergies ? Pertinent Positives include : Objective:  VITALS:  BP 126/76 (BP Location: Left Arm)   Pulse 78   Temp 99.4 F (37.4 C) (Oral)   Resp 18   Ht 5\' 7"  (1.702 m)   Wt 128.8 kg   SpO2 97%   BMI 44.48 kg/m   PHYSICAL EXAM:  General: Alert, cooperative, no distress, appears stated age. Increased BMI Head: Normocephalic, without obvious abnormality, atraumatic. Eyes: Conjunctivae clear, anicteric sclerae. Pupils are equal ENT Nares normal. No drainage or sinus tenderness. Lips, mucosa, and tongue normal. No Thrush Neck: Supple, symmetrical, no adenopathy, thyroid: non tender no carotid bruit and no JVD. Back: No CVA tenderness. Lungs: Clear to auscultation bilaterally. No Wheezing or Rhonchi. No rales. Heart: Regular rate and rhythm, no murmur, rub or gallop. Sternal scar Lumbar spine scar Abdomen: Soft, non-tender,not distended. Bowel sounds normal. No masses Extremities: atraumatic, no cyanosis. No edema. No clubbing Skin: No rashes or lesions. Or bruising Lymph: Cervical, supraclavicular normal. Neurologic: Grossly non-focal Pertinent Labs Lab Results CBC    Component Value Date/Time   WBC 10.4 03/22/2023 0510   RBC 4.38 03/22/2023 0510   HGB 10.2 (L) 03/22/2023 0510   HGB 11.8 12/14/2022 1057   HCT 34.2 (L) 03/22/2023 0510   HCT 39.8 12/14/2022 1057   PLT 169 03/22/2023 0510    PLT 386 12/14/2022 1057   MCV 78.1 (L) 03/22/2023 0510   MCV 76 (L) 12/14/2022 1057   MCV 87 09/28/2014 1405   MCH 23.3 (L) 03/22/2023 0510   MCHC 29.8 (L) 03/22/2023 0510   RDW 17.0 (H) 03/22/2023 0510   RDW 15.1 12/14/2022 1057   RDW 13.5 09/28/2014 1405   LYMPHSABS 3.5 03/06/2023 0330   LYMPHSABS 3.4 (H) 12/14/2022 1057   LYMPHSABS 2.7 09/28/2014 1405   MONOABS 0.5 03/06/2023 0330   MONOABS 0.7 09/28/2014 1405   EOSABS 0.2 03/06/2023 0330   EOSABS 0.3 12/14/2022 1057   EOSABS 0.3 09/28/2014 1405   BASOSABS 0.1 03/06/2023 0330   BASOSABS 0.1 12/14/2022 1057   BASOSABS 0.1 09/28/2014 1405       Latest Ref Rng & Units 03/22/2023    5:10 AM 03/21/2023    2:48 PM 03/06/2023    3:30 AM  CMP  Glucose 70 - 99 mg/dL 884  166  88   BUN 6 - 20 mg/dL 20  22  16    Creatinine 0.44 - 1.00 mg/dL 0.63  0.16  0.10   Sodium 135 - 145 mmol/L 140  138  140   Potassium 3.5 - 5.1 mmol/L 4.3  3.6  3.5   Chloride 98 - 111 mmol/L 111  105  105   CO2 22 - 32  mmol/L 24  21  27    Calcium 8.9 - 10.3 mg/dL 7.7  8.2  8.5   Total Protein 6.5 - 8.1 g/dL 5.7  7.4    Total Bilirubin 0.3 - 1.2 mg/dL 0.7  1.0    Alkaline Phos 38 - 126 U/L 111  80    AST 15 - 41 U/L 104  23    ALT 0 - 44 U/L 60  20        Microbiology: Recent Results (from the past 240 hour(s))  Resp panel by RT-PCR (RSV, Flu A&B, Covid) Anterior Nasal Swab     Status: None   Collection Time: 03/21/23  9:37 PM   Specimen: Anterior Nasal Swab  Result Value Ref Range Status   SARS Coronavirus 2 by RT PCR NEGATIVE NEGATIVE Final    Comment: (NOTE) SARS-CoV-2 target nucleic acids are NOT DETECTED.  The SARS-CoV-2 RNA is generally detectable in upper respiratory specimens during the acute phase of infection. The lowest concentration of SARS-CoV-2 viral copies this assay can detect is 138 copies/mL. A negative result does not preclude SARS-Cov-2 infection and should not be used as the sole basis for treatment or other patient  management decisions. A negative result may occur with  improper specimen collection/handling, submission of specimen other than nasopharyngeal swab, presence of viral mutation(s) within the areas targeted by this assay, and inadequate number of viral copies(<138 copies/mL). A negative result must be combined with clinical observations, patient history, and epidemiological information. The expected result is Negative.  Fact Sheet for Patients:  BloggerCourse.com  Fact Sheet for Healthcare Providers:  SeriousBroker.it  This test is no t yet approved or cleared by the [place] FDA and  has been authorized for detection and/or diagnosis of SARS-CoV-2 by FDA under an Emergency Use Authorization (EUA). This EUA will remain  in effect (meaning this test can be used) for the duration of the COVID-19 declaration under Section 564(b)(1) of the Act, 21 U.S.C.section 360bbb-3(b)(1), unless the authorization is terminated  or revoked sooner.       Influenza A by PCR NEGATIVE NEGATIVE Final   Influenza B by PCR NEGATIVE NEGATIVE Final    Comment: (NOTE) The Xpert Xpress SARS-CoV-2/FLU/RSV plus assay is intended as an aid in the diagnosis of influenza from Nasopharyngeal swab specimens and should not be used as a sole basis for treatment. Nasal washings and aspirates are unacceptable for Xpert Xpress SARS-CoV-2/FLU/RSV testing.  Fact Sheet for Patients: BloggerCourse.com  Fact Sheet for Healthcare Providers: SeriousBroker.it  This test is not yet approved or cleared by the [place] FDA and has been authorized for detection and/or diagnosis of SARS-CoV-2 by FDA under an Emergency Use Authorization (EUA). This EUA will remain in effect (meaning this test can be used) for the duration of the COVID-19 declaration under Section 564(b)(1) of the Act, 21 U.S.C. section 360bbb-3(b)(1),  unless the authorization is terminated or revoked.     Resp Syncytial Virus by PCR NEGATIVE NEGATIVE Final    Comment: (NOTE) Fact Sheet for Patients: BloggerCourse.com  Fact Sheet for Healthcare Providers: SeriousBroker.it  This test is not yet approved or cleared by the [place] FDA and has been authorized for detection and/or diagnosis of SARS-CoV-2 by FDA under an Emergency Use Authorization (EUA). This EUA will remain in effect (meaning this test can be used) for the duration of the COVID-19 declaration under Section 564(b)(1) of the Act, 21 U.S.C. section 360bbb-3(b)(1), unless the authorization is terminated or revoked.  Performed at [facility], [address] [place], [place] [address]   Blood Culture (routine x 2)     Status: None (Preliminary result)   Collection Time: 03/21/23  9:37 PM   Specimen: BLOOD  Result Value Ref Range Status   Specimen Description BLOOD RIGHT ASSIST CONTROL  Final   Special Requests   Final    BOTTLES DRAWN AEROBIC AND ANAEROBIC Blood Culture adequate volume   Culture  Setup Time   Final    GRAM NEGATIVE RODS Organism ID to follow IN BOTH AEROBIC AND ANAEROBIC BOTTLES CRITICAL RESULT CALLED TO, READ BACK BY AND VERIFIED WITH: Drusilla Kanner 03/22/23 5284 MW Performed at [facility], [address] [place], [place] [address]    Culture GRAM NEGATIVE RODS  Final   Report Status PENDING  Incomplete  Blood Culture ID Panel (Reflexed)     Status: Abnormal   Collection Time: 03/21/23  9:37 PM  Result Value Ref Range Status   Enterococcus faecalis NOT DETECTED NOT DETECTED Final   Enterococcus Faecium NOT DETECTED NOT DETECTED Final   Listeria monocytogenes NOT DETECTED NOT DETECTED Final   Staphylococcus species NOT DETECTED NOT DETECTED Final   Staphylococcus aureus (BCID) NOT DETECTED NOT DETECTED Final   Staphylococcus epidermidis NOT DETECTED NOT DETECTED  Final   Staphylococcus lugdunensis NOT DETECTED NOT DETECTED Final   Streptococcus species NOT DETECTED NOT DETECTED Final   Streptococcus agalactiae NOT DETECTED NOT DETECTED Final   Streptococcus pneumoniae NOT DETECTED NOT DETECTED Final   Streptococcus pyogenes NOT DETECTED NOT DETECTED Final   A.calcoaceticus-baumannii NOT DETECTED NOT DETECTED Final   Bacteroides fragilis NOT DETECTED NOT DETECTED Final   Enterobacterales DETECTED (A) NOT DETECTED Final    Comment: Enterobacterales represent a large order of gram negative bacteria, not a single organism. CRITICAL RESULT CALLED TO, READ BACK BY AND VERIFIED WITH: [facility] MITCHELL 03/22/23 0953 MW    Enterobacter cloacae complex NOT DETECTED NOT DETECTED Final   Escherichia coli DETECTED (A) NOT DETECTED Final    Comment: CRITICAL RESULT CALLED TO, READ BACK BY AND VERIFIED WITH: [facility] MITCHELL 03/22/23 0953 MW    Klebsiella aerogenes NOT DETECTED NOT DETECTED Final   Klebsiella oxytoca NOT DETECTED NOT DETECTED Final   Klebsiella pneumoniae NOT DETECTED NOT DETECTED Final   Proteus species NOT DETECTED NOT DETECTED Final   Salmonella species NOT DETECTED NOT DETECTED Final   Serratia marcescens NOT DETECTED NOT DETECTED Final   Haemophilus influenzae NOT DETECTED NOT DETECTED Final   Neisseria meningitidis NOT DETECTED NOT DETECTED Final   Pseudomonas aeruginosa NOT DETECTED NOT DETECTED Final   Stenotrophomonas maltophilia NOT DETECTED NOT DETECTED Final   Candida albicans NOT DETECTED NOT DETECTED Final   Candida auris NOT DETECTED NOT DETECTED Final   Candida glabrata NOT DETECTED NOT DETECTED Final   Candida krusei NOT DETECTED NOT DETECTED Final   Candida parapsilosis NOT DETECTED NOT DETECTED Final   Candida tropicalis NOT DETECTED NOT DETECTED Final   Cryptococcus neoformans/gattii NOT DETECTED NOT DETECTED Final   CTX-M ESBL NOT DETECTED NOT DETECTED Final   Carbapenem resistance IMP NOT DETECTED NOT DETECTED Final    Carbapenem resistance KPC NOT DETECTED NOT DETECTED Final   Carbapenem resistance NDM NOT DETECTED NOT DETECTED Final   Carbapenem resist OXA 48 LIKE NOT DETECTED NOT DETECTED Final   Carbapenem resistance VIM NOT DETECTED NOT DETECTED Final    Comment: Performed at [facility], [address] [place], [place] [address]  Blood Culture (routine x 2)  Status: None (Preliminary result)   Collection Time: 03/21/23  9:45 PM   Specimen: BLOOD  Result Value Ref Range Status   Specimen Description BLOOD LEFT HAND  Final   Special Requests   Final    BOTTLES DRAWN AEROBIC AND ANAEROBIC Blood Culture results may not be optimal due to an excessive volume of blood received in culture bottles   Culture  Setup Time   Final    GRAM NEGATIVE RODS ANAEROBIC BOTTLE ONLY CRITICAL RESULT CALLED TO, READ BACK BY AND VERIFIED WITH: Drusilla Kanner 03/22/23 2956 MW Performed at [facility], [address], [place], [place] [address]    Culture GRAM NEGATIVE RODS  Final   Report Status PENDING  Incomplete    IMAGING RESULTS:  I have personally reviewed the films ?no infiltrate CT abdomen/pelvis Posterior fusion hardware L1-L2 and T10=T11 Left renal atrophy Rt renal lowe rpole lesion Sigmoid diverticulosis  EKG sinu tachycardia  TEE on 08/31/21 Aortic valve endocarditis on bicuspid aortic valve Interatrial septal aneursym and shunting   Impression/Recommendation ? E. coli bacteremia ? Rt pyelonephritis On ceftriaxone  Anemia  H/o Aortic valve endocarditis  in 2022 H/o vertebral infection H/o enterococcus bacteremia   H/o Aortic valve replacement- on coumadin   ___________________________________________________ Discussed with patient, requesting provider Note:  This document was prepared using Dragon voice recognition software and may include unintentional dictation errors.

## 2023-03-22 NOTE — Progress Notes (Signed)
PHARMACY - PHYSICIAN COMMUNICATION CRITICAL VALUE ALERT - BLOOD CULTURE IDENTIFICATION (BCID)  Mary Chambers is an 52 y.o. female who presented to [facility] on 03/21/2023 with a chief complaint of headache  Assessment: blood cultures from 6/19 with GNR in 3/4 bottles, BCID detects E Coli. Most likely source is UTI/pyelonephritis.  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    Name of physician (or Provider) Contacted: Dr Lucianne Muss  Current antibiotics: Ceftriaxone  Changes to prescribed antibiotics recommended:  Patient is on recommended antibiotics - No changes needed  Results for orders placed or performed during the hospital encounter of 03/21/23  Blood Culture ID Panel (Reflexed) (Collected: 03/21/2023  9:37 PM)  Result Value Ref Range   Enterococcus faecalis NOT DETECTED NOT DETECTED   Enterococcus Faecium NOT DETECTED NOT DETECTED   Listeria monocytogenes NOT DETECTED NOT DETECTED   Staphylococcus species NOT DETECTED NOT DETECTED   Staphylococcus aureus (BCID) NOT DETECTED NOT DETECTED   Staphylococcus epidermidis NOT DETECTED NOT DETECTED   Staphylococcus lugdunensis NOT DETECTED NOT DETECTED   Streptococcus species NOT DETECTED NOT DETECTED   Streptococcus agalactiae NOT DETECTED NOT DETECTED   Streptococcus pneumoniae NOT DETECTED NOT DETECTED   Streptococcus pyogenes NOT DETECTED NOT DETECTED   A.calcoaceticus-baumannii NOT DETECTED NOT DETECTED   Bacteroides fragilis NOT DETECTED NOT DETECTED   Enterobacterales DETECTED (A) NOT DETECTED   Enterobacter cloacae complex NOT DETECTED NOT DETECTED   Escherichia coli DETECTED (A) NOT DETECTED   Klebsiella aerogenes NOT DETECTED NOT DETECTED   Klebsiella oxytoca NOT DETECTED NOT DETECTED   Klebsiella pneumoniae NOT DETECTED NOT DETECTED   Proteus species NOT DETECTED NOT DETECTED   Salmonella species NOT DETECTED NOT DETECTED   Serratia marcescens NOT DETECTED NOT DETECTED   Haemophilus influenzae NOT DETECTED NOT  DETECTED   Neisseria meningitidis NOT DETECTED NOT DETECTED   Pseudomonas aeruginosa NOT DETECTED NOT DETECTED   Stenotrophomonas maltophilia NOT DETECTED NOT DETECTED   Candida albicans NOT DETECTED NOT DETECTED   Candida auris NOT DETECTED NOT DETECTED   Candida glabrata NOT DETECTED NOT DETECTED   Candida krusei NOT DETECTED NOT DETECTED   Candida parapsilosis NOT DETECTED NOT DETECTED   Candida tropicalis NOT DETECTED NOT DETECTED   Cryptococcus neoformans/gattii NOT DETECTED NOT DETECTED   CTX-M ESBL NOT DETECTED NOT DETECTED   Carbapenem resistance IMP NOT DETECTED NOT DETECTED   Carbapenem resistance KPC NOT DETECTED NOT DETECTED   Carbapenem resistance NDM NOT DETECTED NOT DETECTED   Carbapenem resist OXA 48 LIKE NOT DETECTED NOT DETECTED   Carbapenem resistance VIM NOT DETECTED NOT DETECTED    Juliette Alcide, PharmD, BCPS, BCIDP Work Cell: [phone] 03/22/2023 10:01 AM

## 2023-03-23 ENCOUNTER — Inpatient Hospital Stay (HOSPITAL_COMMUNITY)
Admit: 2023-03-23 | Discharge: 2023-03-23 | Disposition: A | Discharge status: Home / Self Care | Payer: Medicaid Other | Attending: Student | Admitting: Student

## 2023-03-23 DIAGNOSIS — R7989 Other specified abnormal findings of blood chemistry: Secondary | ICD-10-CM | POA: Diagnosis not present

## 2023-03-23 DIAGNOSIS — I2489 Other forms of acute ischemic heart disease: Secondary | ICD-10-CM

## 2023-03-23 DIAGNOSIS — N10 Acute tubulo-interstitial nephritis: Secondary | ICD-10-CM | POA: Diagnosis not present

## 2023-03-23 DIAGNOSIS — N12 Tubulo-interstitial nephritis, not specified as acute or chronic: Secondary | ICD-10-CM

## 2023-03-23 LAB — BASIC METABOLIC PANEL
Anion gap: 7 (ref 5–15)
BUN: 18 mg/dL (ref 6–20)
CO2: 24 mmol/L (ref 22–32)
Calcium: 8.5 mg/dL — ABNORMAL LOW (ref 8.9–10.3)
Chloride: 109 mmol/L (ref 98–111)
Creatinine, Ser: 1.01 mg/dL — ABNORMAL HIGH (ref 0.44–1.00)
GFR, Estimated: >60 mL/min (ref >60)
Glucose, Bld: 120 mg/dL — ABNORMAL HIGH (ref 70–99)
Potassium: 4.7 mmol/L (ref 3.5–5.1)
Sodium: 140 mmol/L (ref 135–145)

## 2023-03-23 LAB — CBC
HCT: 38.8 % (ref 36.0–46.0)
Hemoglobin: 11.4 g/dL — ABNORMAL LOW (ref 12.0–15.0)
MCH: 23.1 pg — ABNORMAL LOW (ref 26.0–34.0)
MCHC: 29.4 g/dL — ABNORMAL LOW (ref 30.0–36.0)
MCV: 78.5 fL — ABNORMAL LOW (ref 80.0–100.0)
Platelets: 189 10*3/uL (ref 150–400)
RBC: 4.94 MIL/uL (ref 3.87–5.11)
RDW: 17.1 % — ABNORMAL HIGH (ref 11.5–15.5)
WBC: 10 10*3/uL (ref 4.0–10.5)
nRBC: 0 % (ref 0.0–0.2)

## 2023-03-23 LAB — URINE CULTURE

## 2023-03-23 LAB — ECHOCARDIOGRAM COMPLETE
AR max vel: 2.83 cm2
AV Area VTI: 3.09 cm2
AV Area mean vel: 3.04 cm2
AV Mean grad: 12 mmHg
AV Peak grad: 20.1 mmHg
Ao pk vel: 2.24 m/s
Area-P 1/2: 3.65 cm2
Calc EF: 50.9 %
Height: 67 in
MV VTI: 4.82 cm2
S' Lateral: 3.2 cm
Single Plane A2C EF: 37.3 %
Single Plane A4C EF: 61.2 %
Weight: 4673.75 oz

## 2023-03-23 LAB — PROTIME-INR
INR: 2 — ABNORMAL HIGH (ref 0.8–1.2)
Prothrombin Time: 22.5 seconds — ABNORMAL HIGH (ref 11.4–15.2)

## 2023-03-23 LAB — PHOSPHORUS: Phosphorus: 2.5 mg/dL (ref 2.5–4.6)

## 2023-03-23 LAB — MAGNESIUM: Magnesium: 2 mg/dL (ref 1.7–2.4)

## 2023-03-23 LAB — CULTURE, BLOOD (ROUTINE X 2)

## 2023-03-23 MED ORDER — PANTOPRAZOLE SODIUM 40 MG PO TBEC
40.0000 mg | DELAYED_RELEASE_TABLET | Freq: Two times a day (BID) | ORAL | Status: DC
  Administered 2023-03-23 – 2023-03-24 (×2): 40 mg via ORAL
  Filled 2023-03-23 (×2): qty 1

## 2023-03-23 NOTE — Progress Notes (Signed)
[facility] Progress Note  Patient: Mary Chambers    QMV:[id number]  DOA: 03/21/2023     Date of Service: the patient was seen and examined on 03/23/2023  Chief Complaint  Patient presents with   Headache   Brief hospital course: Mary Chambers is a 52 y.o. 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 Pt states she woke up with fever and chills and later found out her BP was low at 80 systolic. She also reports intermittent Irritable bowel with diarrhea no blood noted in stool.  Denies any abdominal pain, no dysuria.  ED course:patient meets sepsis criteria heart rate of 129 respirations of 23 leukocytosis 13.3 mild troponin elevation from 19-51, nitrite positive urine, mild AKI with a serum creatinine of 1.14 EGFR 58 normal LFTs. Lactic acid of 1.5 and repeat of 1.7.  Head CT done in the emergency room is negative,  CT abdomen and pelvis with contrast showed an area in the right kidney about 3.1 cm that is hypodense concerning for pyelonephritis or neoplasm with recommendations for MRI.  EKG shows sinus tach at 134 PR 140 QTc 433. Chest x-ray negative for any acute findings.  Follow blood cultures TRH consulted for admission and further management as below.  Assessment and Plan:  # Sepsis secondary to UTI/right pyelonephritis, E. coli bacteremia UA positive, urine culture growing E. coli Blood culture growing E. Coli, follow sensitivity report Continue ceftriaxone 2 g IV daily MRI consistent with right pyelonephritis Follow ID consult 6/21 f/u repeat blood cultures     # H/o endocarditis s/p mechanical aortic valve replacement Continue Coumadin, pharmacy consulted Monitor INR   # HTN, HLD Continue Lopressor 12.5 twice daily, Lipitor 10 mg nightly Monitor BP and titrate medications accordingly   # Elevated troponin most  likely due to demand ischemia, patient denies any chest pain Troponin 56, continue to trend until peak Continue aspirin 81 mg p.o. daily Follow 2D echocardiogram, in Process today  # Transaminitis most likely due to sepsis Monitor LFTs  # Iron deficiency anemia, transferrin saturation 2%, avoided IV iron due to sepsis and pyelonephritis Started oral iron supplement with vitamin C.  Follow with PCP to repeat iron profile after 3 to 6 months  # Vitamin D deficiency: started vitamin D 50,000 units p.o. weekly, follow with PCP to repeat vitamin D level after 3 to 6 months.  # Morbid obesity Body mass index is 45.75 kg/m.  Interventions: Calorie restricted diet and daily exercise advised to lose body weight.  Lifestyle modification discussed.  Diet: Heart healthy diet DVT Prophylaxis: Therapeutic Anticoagulation with Coumadin    Advance goals of care discussion: Full code  Family Communication: family was not present at bedside, at the time of interview.  The pt provided permission to discuss medical plan with the family. Opportunity was given to ask question and all questions were answered satisfactorily.   Disposition:  Pt is from Home, admitted with sepsis due to right pyelonephritis, still on IV antibiotics, which precludes a safe discharge. Discharge to home, when clinically stable and cleared by ID  Subjective: No significant events overnight, patient was sitting in the recliner comfortably, stated that she has mild tenderness in the right flank area otherwise no new complaints.  Feeling much better.   Physical Exam: General: NAD, lying comfortably Appear in no distress, affect appropriate Eyes: PERRLA ENT: Oral Mucosa Clear, moist  Neck: no JVD,  Cardiovascular: S1 and S2 Present, no Murmur,  Respiratory: good respiratory effort, Bilateral Air entry equal and Decreased, no Crackles, no wheezes Abdomen: Bowel Sound present, Soft, obese, mild right flank tenderness.  Skin:  no rashes Extremities: no Pedal edema, no calf tenderness Neurologic: without any new focal findings Gait not checked due to patient safety concerns  Vitals:   03/22/23 2325 03/23/23 0143 03/23/23 0458 03/23/23 0752  BP: 138/62  (!) 146/70 129/66  Pulse: 87  88 70  Resp: 20  20 17   Temp: 98.8 F (37.1 C)  98.8 F (37.1 C) 98.1 F (36.7 C)  TempSrc:      SpO2: 94%  97% 95%  Weight:  132.5 kg    Height:        Intake/Output Summary (Last 24 hours) at 03/23/2023 1238 Last data filed at 03/22/2023 1930 Gross per 24 hour  Intake 240 ml  Output --  Net 240 ml   Filed Weights   03/21/23 1355 03/22/23 1100 03/23/23 0143  Weight: 59 kg 128.8 kg 132.5 kg    Data Reviewed: I have personally reviewed and interpreted daily labs, tele strips, imagings as discussed above. I reviewed all nursing notes, pharmacy notes, vitals, pertinent old records I have discussed plan of care as described above with RN and patient/family.  CBC: Recent Labs  Lab 03/21/23 1358 03/22/23 0510 03/23/23 0504  WBC 13.3* 10.4 10.0  HGB 12.6 10.2* 11.4*  HCT 41.0 34.2* 38.8  MCV 75.1* 78.1* 78.5*  PLT 252 169 189   Basic Metabolic Panel: Recent Labs  Lab 03/21/23 1448 03/22/23 0510 03/23/23 0504  NA 138 140 140  K 3.6 4.3 4.7  CL 105 111 109  CO2 21* 24 24  GLUCOSE 120* 116* 120*  BUN 22* 20 18  CREATININE 1.14* 1.10* 1.01*  CALCIUM 8.2* 7.7* 8.5*  MG  --  1.9 2.0  PHOS  --  4.3 2.5    Studies: No results found.  Scheduled Meds:  vitamin C  500 mg Oral Daily   aspirin EC  81 mg Oral Daily   atorvastatin  10 mg Oral QPM   escitalopram  20 mg Oral Daily   fluticasone  2 spray Each Nare Daily   iron polysaccharides  150 mg Oral Daily   metoprolol tartrate  12.5 mg Oral BID   pantoprazole (PROTONIX) IV  40 mg Intravenous Q12H   Vitamin D (Ergocalciferol)  50,000 Units Oral Q7 days   [START ON 03/26/2023] warfarin  2.5 mg Oral Q Mon-1800   warfarin  5 mg Oral Once per day on Sun Tue  Wed Thu Fri Sat   Warfarin - Pharmacist Dosing Inpatient   Does not apply q1600   Continuous Infusions:  cefTRIAXone (ROCEPHIN)  IV 2 g (03/22/23 2105)   PRN Meds: acetaminophen, LORazepam, morphine injection, ondansetron (ZOFRAN) IV  Time spent: 35 minutes  Author: Gillis Santa. MD [facility] 03/23/2023 12:38 PM  To reach On-call, see care teams to locate the attending and reach out to them via [email or web address]. If 7PM-7AM, please contact night-coverage If you still have difficulty reaching the attending provider, please page the [facility] (Director on Call) for [facility] on amion for assistance.

## 2023-03-23 NOTE — Progress Notes (Signed)
*  PRELIMINARY RESULTS* Echocardiogram 2D Echocardiogram has been performed.  Carolyne Fiscal 03/23/2023, 1:12 PM

## 2023-03-23 NOTE — Discharge Instructions (Signed)
Food Resources  Agency Name: [facility] Address: [address], [place], [place] [address] Phone: [phone] Website: www.alamanceservices.org  Service(s) Offered: Housing services, self-sufficiency, congregate meal  program, weatherization program, heating appliance  repair/replacement program, emergency food assistance,  housing counseling, home ownership program, wheels -towork program. Meals free for 60 and older at various  locations from 9am-1pm, Monday-Friday:  [facility], [address] [place], [phone] [place]   [facility], [address] [place] [phone]   [facility], [address]   [place] [phone] [facility], [address]   [place], [phone]  Agency Name: [facility] Address: [address], [place], [place] [address] Phone: [phone] Website: www.[email or web address] Service(s) Offered: Home delivered hot, frozen, and emergency  meals. Grocery assistance program which matches  volunteers one-on-one with seniors unable to grocery shop  for themselves. Must be 60 years and older; less than 20  hours of in-home aide service, limited or no driving ability;  live alone or with someone with a disability; live in  [place].  Agency Name: [facility] [employer] Address: [address] [place], [place] [address] Phone: [phone] Service(s) Offered: Food is served to shut-ins, homeless, elderly, and low  income people in the community every Saturday (11:30  am-12:30 pm) and Sunday (12:30 pm-1:30pm). Volunteers  also offer help and encouragement in seeking employment,  and spiritual guidance. January 25, 2017 8  Agency Name: [facility] Address: [address] [place], [place], [place] [address] Phone: [phone] Service(s) Offered: Child support services; child welfare services; food stamps;  Medicaid;  work first family assistance; and aid with fuel,  rent, food and medicine.  Agency Name: [facility] Address: [address] [place], [place] Phone: [phone] Website: www.dreamalign.com Services Offered: Monday 10:00am-12:00, 8:00pm-9:00pm, and Friday  10:00am-12:00. Agency Name: [facility] Address: [address], [place], [place] [address] Phone: [phone] Website: www.[email or web address].org Service(s) Offered: Serves weekday meals, open from 11:30 am- 1:00 pm., and  6:30-7:30pm, Monday-Wednesday-Friday distributes food  3:30-6pm, Monday-Wednesday-Friday.  Agency Name: [facility] Address: [address], [place], [place] Phone: [phone] Website: [email or web address] Services Offered: Distributes food the 4th Saturday of the month, starting at  8:00 am Agency Name: [facility] Address: [address], [place], [place] [address] Phone: [phone] Website: [email or web address] Service(s) Offered: [employer] [facility], weekly food pantry. Open Wednesdays from  10:00am-noon.  Agency Name: [facility] Address: [address], [place], [place] Phone: [phone] Services Offered: Distributes food 9am-1pm, Monday-Thursday. Call for details.   Agency Name: [facility] Address: [address] [place], [place] [address] Phone: [phone] Website: [email or web address] Service(s) Offered: Emergency Food Pantry. Call for assistance. Agency Name: [facility] Address: [address], [place], [place] [address] Phone: [phone] Service Offered: Emergency Food Pantry. Call for appointment.  Agency Name: [facility] Address: [address] [place], [place] [address] Phone: [phone] Website: [email or web address] Services Offered: Emergency Food Pantry. Call for details Agency Name: [facility] Address: [address] [place], [place] Phone: [phone] Website: [email or web address] Service(s) Offered: Emergency Food Pantry. Call for details.  Agency Name: [facility] Address: [address], [place],  [address] Phone: [phone] or [phone] Website: [email or web address] Service(s) Offered: Distribute food 9am-11:30 am, Tuesday-Friday, and 1- 3:30pm, Monday-Friday. Food pantry Monday-Friday  1pm-3pm, fresh items, Mon.-Wed.-Fri.  Agency Name: [facility] Address: [address] [place], [place] [address] Phone: [phone] Website: www.safealamance.org Services Offered: Distribute food Tues and Sats from 9:00am-noon. Closed  1st Saturday of each month. Call for details    Rent/Utility/Housing  Agency Name: [place] Address: [address] [place], [place] [address] Phone: [phone] Email: [email or web address] Website: www.[email or web address].org Service(s) Offered: Housing services, self-sufficiency, congregate meal  program, weatherization program, heating appliance  repair/replacement program, emergency food assistance,  housing counseling, home ownership program, wheels -towork program.  Agency Name: [facility] Address: [address], [place], [place] [address] Phone: [phone] (8a-4p) [phone] (8p- 10p) Email: [email or web address] Website: [email or web address] Service(s) Offered: A program for homeless and/or needy men that includes one-on-one counseling, life skills training and job rehabilitation.  Agency Name: [facility] Address: [address], [place], [place] [address] Phone: [phone] Website: www.alliedchurches.org Service(s) Offered: Assistance to needy in emergency with utility bills, heating  fuel, and prescriptions. Shelter for homeless 7pm-7am. January 25, 2017 15  Agency Name: [facility] Address: [address], [place], [place] [address] Phone: [phone] Contact Person: Wayne Dawson Email: [email or web address] Website: www.[email or web address].org Service(s) Offered: Helps individuals with developmental disabilities move  from housing that is more restrictive to homes where they  can achieve greater independence and have more  opportunities.  Agency Name: [facility] Address: [address], [place], [place] [address] Phone: [phone] Email: [email or web address] Website: www.[email or web address].org Service(s) Offered: Provides affordable housing for low-income families,  elderly, and disabled individuals. Offer a wide range of  programs and services, from financial planning to afterschool and summer programs.  Agency Name: [facility] Address: [address] [place], [place], [place] [address] Phone: [phone] Service(s) Offered: Child support services; child welfare services; food stamps;  Medicaid; work first family assistance; and aid with fuel,  rent, food and medicine.  Agency Name: [facility] Address: [facility] [place], [place]  [address] Phone: [phone] Website: www.familyabuseservices.org Service(s) Offered: 24 hour Crisis Line: [phone]; 24 hour Emergency Shelter;  Transitional Housing; Support Groups; Court Advocacy;  Community Education; Hispanic Outreach: [phone];  Visitation Center: [phone]. January 25, 2017 16  Agency Name: [facility] Address: [address] [place], [place] [address] Phone: [phone] Service(s) Offered: CAP Services; Home and Community Supports; Individual  or Group Supports; Respite Care Non-Institutional Nursing;  Residential Supports; Respite Care and Personal Care  Services; Transportation; Family and Friends Night;  Recreational Activities; Three  Nutritious Meals/Snacks;  Consultation with Registered Dietician; Twenty-four hour  Registered Nurse Access; Daily and Community Living  Skills; Camp Green Leaves; Summer Camp for the Special  Population (During Summer Months) Bingo Night (Every  Wednesday Night); Special Populations Dance Night  (Every Tuesday Night); Professional Hair Care Services.  Agency Name: [facility] Address: [address], [place], [place] [address] Phone: [phone] Contact Person: Gloria McCauley Website: [email or web address] Service(s) Offered: Residential treatment facility for women; food and  clothing, educational & employment development and  transportation to work; development of financial skills;  parenting and family reunification; emotional and spiritual  support; transitional housing for program graduates.  Agency Name: [place] [facility] Address: [address], [place], [place] [address] Phone: [phone] Email: [email or web address] Website: [email or web address] Service(s) Offered: Public housing units for elderly, disabled, and low income  people; housing choice vouchers for income eligible  applicants; shelter plus care vouchers; and HOPWA  voucher program. January 25, 2017 17  Agency Name: [facility] Address: [address], [place], [place] [address] Phone: [phone] Email: [email or web address]@[email or web address] Website: www.habitatalamance.org Service(s) Offered: Build houses for families in need of decent housing. Each  adult in the family must invest 200 hours of labor on  someone else's house, work with volunteers to build their  own house, attend classes on budgeting, home maintenance, yard care, and attend homeowner association  meetings.  Agency Name: [facility] Address: [address], [place], [place] [address] Phone: [phone] Website: www.rsli.org Service(s) Offered: Intermediate care  facilities for mentally retarded,  Supervised Living in group homes for adults with  developmental disabilities, Supervised Living for people  who have dual diagnoses (MRMI), Independent Living,  Supported Living, respite and a variety of CAP services,  pre-vocational services, day supports, and Community  Support Services.  Agency Name: [place] [employer] Phone: [phone] Website: www.NCForeclosurePrevention.gov Service(s) Offered: Zero-interest, deferred loans to homeowners struggling to  pay their mortgage. Call for more information  Transportation Agency Name: [facility] Address: [address] [place], [place] [address] Phone: [phone] Email: [email or web address] Website: www.alamanceservices.org Service(s) Offered: Housing services, self-sufficiency, congregate meal  program, weatherization program, heating appliance  repair/replacement program, emergency food assistance,  housing counseling, home ownership program, wheels-towork program. January 25, 2017 22  Agency Name:  [facility] [employer] [facility] Address: [address], [place], [place] [address] Phone: [phone] Email:  Website: www.acta-[place].com Service(s) Offered: Transportation for general public, subscription and demand  response; Dial-a-Ride for citizens 60 years of age or older. Agency Name: [facility] [employer] [facility] Address: [address] [place], [place], [place] [address] Phone: [phone] Service(s) Offered: Child support services; child welfare services; food stamps;  Medicaid; work first family assistance; and aid with fuel,  rent, food and medicine, transportation assistance.  Agency Name: [employer] [employer]  [employer] Address: Phone: [phone] Service(s) Offered: Transports veterans to the [facility]. Call  forty-eight hours in advance and leave the name, telephone  number,  date, and time of appointment. Veteran will be  contacted by the driver the day before the appointment to  arrange a pick up point    

## 2023-03-23 NOTE — Progress Notes (Signed)
Date of Admission:  03/21/2023      ID: Mary Chambers is a 52 y.o. female Principal Problem:   Acute pyelonephritis Active Problems:   Anxiety and depression   Headache   Hypertension, benign   Migraines   Morbidly obese (HCC)   Gastroesophageal reflux disease   History of gastric ulcer   S/P AVR (aortic valve replacement)   Long term (current) use of anticoagulants   E coli bacteremia    Subjective: Pt doing better No fever or chills  Medications:   vitamin C  500 mg Oral Daily   aspirin EC  81 mg Oral Daily   atorvastatin  10 mg Oral QPM   escitalopram  20 mg Oral Daily   fluticasone  2 spray Each Nare Daily   iron polysaccharides  150 mg Oral Daily   metoprolol tartrate  12.5 mg Oral BID   pantoprazole (PROTONIX) IV  40 mg Intravenous Q12H   Vitamin D (Ergocalciferol)  50,000 Units Oral Q7 days   [START ON 03/26/2023] warfarin  2.5 mg Oral Q Mon-1800   warfarin  5 mg Oral Once per day on Sun Tue Wed Thu Fri Sat   Warfarin - Pharmacist Dosing Inpatient   Does not apply q1600    Objective: Vital signs in last 24 hours: Patient Vitals for the past 24 hrs:  BP Temp Temp src Pulse Resp SpO2 Weight  03/23/23 0752 129/66 98.1 F (36.7 C) -- 70 17 95 % --  03/23/23 0458 (!) 146/70 98.8 F (37.1 C) -- 88 20 97 % --  03/23/23 0143 -- -- -- -- -- -- 132.5 kg  03/22/23 2325 138/62 98.8 F (37.1 C) -- 87 20 94 % --  03/22/23 2107 126/66 98.9 F (37.2 C) -- 88 16 98 % --  03/22/23 1512 126/76 99.4 F (37.4 C) Oral 78 18 97 % --  03/22/23 1307 123/72 98.1 F (36.7 C) Oral 74 16 97 % --  03/22/23 1100 -- -- -- -- -- -- 128.8 kg     PHYSICAL EXAM:  General: Alert, cooperative, no distress, appears stated age.  Head: Normocephalic, without obvious abnormality, atraumatic. Eyes: Conjunctivae clear, anicteric sclerae. Pupils are equal ENT Nares normal. No drainage or sinus tenderness. Lips, mucosa, and tongue normal. No Thrush Neck: Supple, symmetrical, no  adenopathy, thyroid: non tender no carotid bruit and no JVD. Back: No CVA tenderness. Lungs: Clear to auscultation bilaterally. No Wheezing or Rhonchi. No rales. Heart: Regular rate and rhythm, no murmur, rub or gallop. Abdomen: Soft, non-tender,not distended. Bowel sounds normal. No masses Extremities: atraumatic, no cyanosis. No edema. No clubbing Skin: No rashes or lesions. Or bruising Lymph: Cervical, supraclavicular normal. Neurologic: Grossly non-focal  Lab Results    Latest Ref Rng & Units 03/23/2023    5:04 AM 03/22/2023    5:10 AM 03/21/2023    1:58 PM  CBC  WBC 4.0 - 10.5 K/uL 10.0  10.4  13.3   Hemoglobin 12.0 - 15.0 g/dL 82.9  56.2  13.0   Hematocrit 36.0 - 46.0 % 38.8  34.2  41.0   Platelets 150 - 400 K/uL 189  169  252        Latest Ref Rng & Units 03/23/2023    5:04 AM 03/22/2023    5:10 AM 03/21/2023    2:48 PM  CMP  Glucose 70 - 99 mg/dL 865  784  696   BUN 6 - 20 mg/dL 18  20  22  Creatinine 0.44 - 1.00 mg/dL 5.40  9.81  1.91   Sodium 135 - 145 mmol/L 140  140  138   Potassium 3.5 - 5.1 mmol/L 4.7  4.3  3.6   Chloride 98 - 111 mmol/L 109  111  105   CO2 22 - 32 mmol/L 24  24  21    Calcium 8.9 - 10.3 mg/dL 8.5  7.7  8.2   Total Protein 6.5 - 8.1 g/dL  5.7  7.4   Total Bilirubin 0.3 - 1.2 mg/dL  0.7  1.0   Alkaline Phos 38 - 126 U/L  111  80   AST 15 - 41 U/L  104  23   ALT 0 - 44 U/L  60  20       Microbiology: 03/21/23 [employer] Ecoli 6/19 /24 UC- ecoli 03/22/22 [facility] sent Studies/Results: MR ABDOMEN W WO CONTRAST  Result Date: 03/22/2023 CLINICAL DATA:  Characterize right renal lesion identified by prior CT suspected pyelonephritis versus renal neoplasm EXAM: MRI ABDOMEN WITHOUT AND WITH CONTRAST TECHNIQUE: Multiplanar multisequence MR imaging of the abdomen was performed both before and after the administration of intravenous contrast. CONTRAST:  6mL GADAVIST GADOBUTROL 1 MMOL/ML IV SOLN COMPARISON:  CT abdomen pelvis, 03/21/2023 FINDINGS: Examination is  generally limited by breath motion artifact. Lower chest: No acute abnormality. Hepatobiliary: No focal liver abnormality is seen. Status post cholecystectomy. No biliary dilatation. Pancreas: Unremarkable. No pancreatic ductal dilatation or surrounding inflammatory changes. Spleen: Normal in size without significant abnormality. Adrenals/Urinary Tract: Adrenal glands are unremarkable. Very subtle, intrinsically T2 hyperintense, T1 hypointense lesion of the inferior pole of the right kidney, which is minimally hypoenhancing (series 4, image 22, series 22, image 43). This measures 4.2 x 3.2 cm (series 4, image 22). Bilateral perinephric fat stranding and fluid. Mildly atrophic left kidney. Simple, benign left renal cortical cysts, for which no further follow-up or characterization is required. Kidneys are otherwise normal, without obvious renal calculi, solid lesion, or hydronephrosis. Stomach/Bowel: Stomach is within normal limits. No evidence of bowel wall thickening, distention, or inflammatory changes. Vascular/Lymphatic: No significant vascular findings are present. No enlarged abdominal lymph nodes. Other: No abdominal wall hernia or abnormality. No ascites. Musculoskeletal: No acute or significant osseous findings. IMPRESSION: 1. Very subtle, minimally hypoenhancing lesion of the inferior pole of the right kidney corresponding to finding prior CT. This measures 4.2 x 3.2 cm and is most consistent with pyelonephritis. 2. Bilateral perinephric fat stranding and fluid, infectious or inflammatory. 3. Mildly atrophic left kidney. 4. No hydronephrosis. 5. Status post cholecystectomy. Electronically Signed   By: Jearld Lesch M.D.   On: 03/22/2023 11:20   CT Head Wo Contrast  Result Date: 03/21/2023 CLINICAL DATA:  Headache EXAM: CT HEAD WITHOUT CONTRAST TECHNIQUE: Contiguous axial images were obtained from the base of the skull through the vertex without intravenous contrast. RADIATION DOSE REDUCTION: This exam  was performed according to the departmental dose-optimization program which includes automated exposure control, adjustment of the mA and/or kV according to patient size and/or use of iterative reconstruction technique. COMPARISON:  03/06/2023 FINDINGS: Brain: There are streak artifacts partially obscuring the posterior fossa, caused by the earrings. There is no mass, hemorrhage or extra-axial collection. The size and configuration of the ventricles and extra-axial CSF spaces are normal. The brain parenchyma is normal, without acute or chronic infarction. Vascular: No abnormal hyperdensity of the major intracranial arteries or dural venous sinuses. No intracranial atherosclerosis. Skull: The visualized skull base, calvarium and extracranial soft tissues are normal. Sinuses/Orbits:  No fluid levels or advanced mucosal thickening of the visualized paranasal sinuses. No mastoid or middle ear effusion. The orbits are normal. IMPRESSION: Normal head CT. Electronically Signed   By: Deatra Robinson M.D.   On: 03/21/2023 22:14   DG Chest 1 View  Result Date: 03/21/2023 CLINICAL DATA:  Chest pain. EXAM: CHEST  1 VIEW COMPARISON:  02/14/2022 FINDINGS: Stable surgical changes with median sternotomy wires and lower thoracic spinal fusion hardware. The heart is normal in size. No acute pulmonary findings. No pleural effusions or pulmonary lesions. The bony thorax is intact. IMPRESSION: No acute cardiopulmonary findings. Electronically Signed   By: Rudie Meyer M.D.   On: 03/21/2023 17:48   CT ABDOMEN PELVIS W CONTRAST  Result Date: 03/21/2023 CLINICAL DATA:  Right lower quadrant pain EXAM: CT ABDOMEN AND PELVIS WITH CONTRAST TECHNIQUE: Multidetector CT imaging of the abdomen and pelvis was performed using the standard protocol following bolus administration of intravenous contrast. RADIATION DOSE REDUCTION: This exam was performed according to the departmental dose-optimization program which includes automated exposure  control, adjustment of the mA and/or kV according to patient size and/or use of iterative reconstruction technique. CONTRAST:  OMNIPAQUE IOHEXOL 300 MG/ML  SOLN COMPARISON:  CT abdomen and pelvis 01/17/2023 FINDINGS: Lower chest: No acute abnormality. Hepatobiliary: No focal liver abnormality is seen. Status post cholecystectomy. No biliary dilatation. Pancreas: Unremarkable. No pancreatic ductal dilatation or surrounding inflammatory changes. Spleen: Normal in size without focal abnormality. Adrenals/Urinary Tract: Left renal atrophy is unchanged. There is cortical scarring in both kidneys. There is no hydronephrosis or urinary tract calculus identified. Vague focal hypodense area seen in the inferior pole the right kidney measuring 2.7 x 3.1 cm image 2/50. The adrenal glands and bladder are within normal limits. Stomach/Bowel: Stomach is within normal limits. Appendix appears normal. No evidence of bowel wall thickening, distention, or inflammatory changes. There is sigmoid colon diverticulosis. Vascular/Lymphatic: No significant vascular findings are present. No enlarged abdominal or pelvic lymph nodes. Reproductive: Status post hysterectomy. No adnexal masses. Other: No abdominal wall hernia or abnormality. No abdominopelvic ascites. Musculoskeletal: Posterior fusion hardware seen at L1-L2 and T10-T11. IMPRESSION: 1. Vague hypodense area in the inferior pole of the right kidney measuring 3.1 cm. Findings may represent focal pyelonephritis or renal neoplasm. Further evaluation with MRI recommended. 2. No other acute localizing process in the abdomen or pelvis. 3. Stable left renal atrophy. 4. Sigmoid colon diverticulosis. Electronically Signed   By: Darliss Cheney M.D.   On: 03/21/2023 16:26     Assessment/Plan: ? E. coli bacteremia Secondary to complicated UTI Rt pyelonephritis On ceftriaxone Susceptibilities pending Await to decide on discharge antibiotis May need a total of 10 days of  antibiotic If S to quinolone she can go on that until 03/31/23   Anemia   H/o Aortic valve endocarditis  in 2022 H/o vertebral infection H/o enterococcus bacteremia     H/o Aortic valve replacement- on coumadin  Discussed the management with the patient [facility] on call this weekend- available by phone for urgent issues

## 2023-03-23 NOTE — Progress Notes (Addendum)
ANTICOAGULATION CONSULT NOTE - Initial Consult  Pharmacy Consult for Warfarin  Indication:  mechanical aortic valve  Allergies  Allergen Reactions   Baclofen Hives   Dilaudid [Hydromorphone] Itching   Oxycodone Hcl Itching   Percocet [Oxycodone-Acetaminophen] Hives   Septra [Sulfamethoxazole-Trimethoprim] Hives   Vibramycin [Doxycycline] Itching   Neurontin [Gabapentin] Rash    Patient Measurements: Height: 5\' 7"  (170.2 cm) Weight: 132.5 kg (292 lb 1.8 oz) IBW/kg (Calculated) : 61.6 Heparin Dosing Weight: n/a  Vital Signs: Temp: 98.8 F (37.1 C) (06/21 0458) BP: 146/70 (06/21 0458) Pulse Rate: 88 (06/21 0458)  Labs: Recent Labs    03/21/23 1358 03/21/23 1448 03/21/23 1448 03/21/23 1556 03/21/23 1953 03/22/23 0510 03/22/23 1532 03/23/23 0504  HGB 12.6  --   --   --   --  10.2*  --  11.4*  HCT 41.0  --   --   --   --  34.2*  --  38.8  PLT 252  --   --   --   --  169  --  189  LABPROT  --   --   --  20.7*  --  21.7*  --   --   INR  --   --   --  1.8*  --  1.9*  --   --   CREATININE  --  1.14*  --   --   --  1.10*  --  1.01*  TROPONINIHS  --  19*   < >  --  51* 56* 22*  --    < > = values in this interval not displayed.     Estimated Creatinine Clearance: 92.6 mL/min (A) (by C-G formula based on SCr of 1.01 mg/dL (H)).   Medical History: Past Medical History:  Diagnosis Date   Abscess 06/23/2021   Achilles tendonitis 01/16/2014   Overview:   Followed by [employer].  Treated with brace and diclofenac.   Acute cystitis 09/12/2013   Last Assessment & Plan:   Formatting of this note might be different from the original.  Symptoms most consistent with acute cystitis     -- Lab testing: urine sent for culture and sensitivities  -- Antibiotic: proceed now with prescription for cipro 250 BID x 5 days (risks/benefits discussed).  -- Symptom relief: Use phenazopyridine (AZO) as needed.  Maintain hydration.  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  Follow-up with Dr. Jaynie Collins.  Watch for opportunities to increase physical activity within neuromuscular capabilities.   Chronic back pain 02/13/2018   Chronic pain syndrome 02/02/2016   Closed fracture of sacrum with routine healing 12/22/2021   COVID-19 virus infection 06/23/2021   Depression    Ear pain 03/07/2023   Herniated lumbar disc without myelopathy 10/02/2019   History of bacterial endocarditis 07/05/2022   Hypertension    Insomnia 12/08/2021   Kidney stone    Malodorous urine 05/13/2020   Last Assessment & Plan:   Formatting of this note might be different from the original.  Given no dysuria, urgency and a negative UA, this is unlikely a UTI.  Patient advised to drink more water.   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   Transaminitis 06/23/2021   Urinary incontinence 06/03/2010   Vertebral osteomyelitis (HCC)     Medications:  Medications Prior to Admission  Medication Sig Dispense Refill Last Dose   amLODipine (NORVASC) 2.5 MG tablet TAKE 1 TABLET BY MOUTH EVERY DAY 90 tablet 1 03/20/2023   Ascorbic Acid (VITAMIN C) 500 MG CAPS Take 500 mg by mouth in the morning.   03/20/2023   aspirin EC 81 MG tablet Take 1 tablet (81 mg total) by mouth daily. Swallow whole. 30 tablet 11 03/20/2023   atorvastatin (LIPITOR) 10 MG tablet Take 1 tablet (10 mg total) by mouth every evening. 90 tablet 3 03/20/2023   cholecalciferol (VITAMIN D3) 25 MCG (1000 UT) tablet Take 1,000 Units by mouth in the morning.   03/20/2023   escitalopram (LEXAPRO) 20 MG tablet TAKE 1 TABLET BY MOUTH EVERY DAY 90 tablet 1 03/20/2023    fluticasone (FLONASE) 50 MCG/ACT nasal spray Place 2 sprays into both nostrils daily. 16 g 6 03/20/2023   furosemide (LASIX) 20 MG tablet Take 1 tablet (20 mg total) by mouth daily. 90 tablet 3 03/20/2023   loperamide (IMODIUM) 2 MG capsule Take 1 capsule (2 mg total) by mouth as needed for diarrhea or loose stools. 30 capsule 0 unk at unk   metoprolol tartrate (LOPRESSOR) 25 MG tablet Take 0.5 tablets (12.5 mg total) by mouth 2 (two) times daily. 90 tablet 3 03/20/2023   Multiple Vitamin (MULTIVITAMIN WITH MINERALS) TABS tablet Take 1 tablet by mouth in the morning.   03/20/2023   naproxen (NAPROSYN) 250 MG tablet Take 1 tablet (250 mg total) by mouth 2 (two) times daily as needed. 60 tablet 1 Past Week at 2000   pantoprazole (PROTONIX) 40 MG tablet Take 1 tablet (40 mg total) by mouth daily. 90 tablet 3 03/20/2023 at 0900   Potassium (POTASSIMIN PO) Take 1 tablet by mouth daily as needed (with lasix).   unk at unk   tiZANidine (ZANAFLEX) 4 MG tablet Take 1 tablet (4 mg total) by mouth at bedtime. 90 tablet 3 03/20/2023 at 2000   warfarin (COUMADIN) 5 MG tablet TAKE 1 TO 1 & 1/2 TABLETS DAILY OR AS DIRECTED BY COUMADIN CLINIC 105 tablet 1 03/20/2023 at 0900   amoxicillin (AMOXIL) 500 MG capsule Take 4 capsules (2000 mg) 30-60 min prior to dental work (Patient not taking: Reported on 03/21/2023) 4 capsule 3 Not Taking   amoxicillin-clavulanate (AUGMENTIN) 875-125 MG tablet Take 1 tablet by mouth 2 (two) times daily. (Patient not taking: Reported on 03/21/2023) 10 tablet 0 Not Taking    Assessment: Pharmacy consulted to dose warfarin in this 52 year old female who presented with headache, N/V, and chills. Found to have E. Coli bacteremia. Patient has a history of mechanical On-X aortic valve replacement in 11/2021. Pt was on warfarin 2.5 mg PO every Monday and warfarin 5 mg PO all other days. Last home dose 6/18 @ 0900. INRs have been therapeutic since 03/01/2023 and the patient's INR goal changed from 2.0-3.0 to  1.5-2.0. Patient is also on ceftriaxone this admission.  DATE INR DOSE  6/19 1.8 7.5mg   6/20  1.9 5 mg  6/21 2.0 5 mg       Goal of Therapy:  INR: 1.5-2.0    Plan:  6/21:  INR @ 0905 = 2.0 - Continue home warfarin dosing schedule :    Warfarin  2.5 mg PO every Monday    Warfarin 5 mg PO Tues, Wed, Thurs, Fri, Sat, Sun  - Will recheck INR daily and adjust dose as needed.   Francetta Found, PharmD Candidate Class of 2025  03/23/2023 7:29 AM

## 2023-03-23 NOTE — TOC Initial Note (Signed)
Transition of Care [facility]) - Initial/Assessment Note    Patient Details  Name: Mary Chambers MRN: [id number] Date of Birth: Mar 28, 1971  Transition of Care [facility]) CM/SW Contact:    Liliana Cline, LCSW Phone Number: 03/23/2023, 2:42 PM  Clinical Narrative:                 Patient with high readmission risk score and SDOH flagged for food, housing, and transportation. CSW spoke to patient.  Patient lives at home with family. Family provides transport. PCP is Dr. De Peru. [facility] [place]. Patient has a shower chair, raised toilet seta, and cane at home. No HH or SNF history.  Patient is agreeable to [facility] resources, added to AVS.  No additional needs at this time.   Expected Discharge Plan: Home/Self Care Barriers to Discharge: Continued Medical Work up, ED Facility/Family Requesting Medication(s) Adjustment   Patient Goals and CMS Choice            Expected Discharge Plan and Services                                              Prior Living Arrangements/Services   Lives with:: Relatives Patient language and need for interpreter reviewed:: Yes Do you feel safe going back to the place where you live?: Yes      Need for Family Participation in Patient Care: Yes (Comment) Care giver support system in place?: Yes (comment) Current home services: DME Criminal Activity/Legal Involvement Pertinent to Current Situation/Hospitalization: No - Comment as needed  Activities of Daily Living Home Assistive Devices/Equipment: Cane (specify quad or straight), Dentures (specify type), Eyeglasses ADL Screening (condition at time of admission) Patient's cognitive ability adequate to safely complete daily activities?: Yes Is the patient deaf or have difficulty hearing?: No Does the patient have difficulty seeing, even when wearing glasses/contacts?: No Does the patient have difficulty concentrating, remembering, or making decisions?: No Patient able to express  need for assistance with ADLs?: Yes Does the patient have difficulty dressing or bathing?: No Independently performs ADLs?: Yes (appropriate for developmental age) Does the patient have difficulty walking or climbing stairs?: No Weakness of Legs: None Weakness of Arms/Hands: None  Permission Sought/Granted Permission sought to share information with : Facility Industrial/product designer granted to share information with : Yes, Verbal Permission Granted     Permission granted to share info w AGENCY: if needed        Emotional Assessment       Orientation: : Oriented to Self, Oriented to Situation, Oriented to Place, Oriented to  Time Alcohol / Substance Use: Not Applicable Psych Involvement: No (comment)  Admission diagnosis:  Pyelonephritis [N12] Tachycardia [R00.0] Headache [R51.9] Acute nonintractable headache, unspecified headache type [R51.9] Patient Active Problem List   Diagnosis Date Noted   Acute pyelonephritis 03/22/2023   E coli bacteremia 03/22/2023   Wellness examination 07/27/2022   Lumbar pseudoarthrosis 12/22/2021   Scoliosis (and kyphoscoliosis), idiopathic 12/22/2021   Prediabetes 12/08/2021   Long term (current) use of anticoagulants 11/17/2021   S/P AVR (aortic valve replacement) 11/07/2021   Severe aortic regurgitation 07/27/2021   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   Septic arthritis (HCC) 06/23/2021   Gastroesophageal reflux disease 12/15/2020   Screening for colorectal cancer 12/15/2020   History of gastric ulcer 12/15/2020   Thoracic aortic aneurysm (HCC) 05/12/2020   Lumbar spinal stenosis  10/01/2019   Nephrolithiasis 07/29/2019   Prepyloric ulcer 07/17/2018   Sleep apnea 06/25/2018   Pain in left foot 03/29/2018   Migraines 02/13/2018   Morbidly obese (HCC) 02/13/2018   Synovial cyst of lumbar spine 10/15/2017   Osteoarthritis of knee 07/13/2017   Lumbar disc disease with radiculopathy 08/01/2016   Headache 05/04/2016   Numbness  of foot 05/04/2016   Lumbar stenosis with neurogenic claudication 05/07/2015   Bilateral leg edema 04/21/2015   Renal atrophy, left 01/23/2014   Right lower quadrant pain 12/18/2013   Hypertension, benign 06/03/2010   Anxiety and depression 10/08/2007   PCP:  de Peru, Raymond J, MD Pharmacy:   [phone] [place], [place] - [address] [place] [address] Phone: [phone] Fax: [phone]     Social Determinants of Health (SDOH) Social History: SDOH Screenings   Food Insecurity: Food Insecurity Present (03/22/2023)  Housing: High Risk (03/22/2023)  Transportation Needs: Unmet Transportation Needs (03/22/2023)  Utilities: Not At Risk (03/22/2023)  Depression (PHQ2-9): Medium Risk (03/07/2023)  Financial Resource Strain: Medium Risk (03/06/2023)  Physical Activity: Unknown (03/06/2023)  Social Connections: Socially Isolated (03/06/2023)  Stress: Stress Concern Present (03/06/2023)  Tobacco Use: Low Risk  (03/22/2023)   SDOH Interventions:     Readmission Risk Interventions    11/11/2021    3:57 PM  Readmission Risk Prevention Plan  Transportation Screening Complete  PCP or Specialist Appt within 5-7 Days Complete  Home Care Screening Complete  Medication Review (RN CM) Complete

## 2023-03-24 DIAGNOSIS — N10 Acute tubulo-interstitial nephritis: Secondary | ICD-10-CM | POA: Diagnosis not present

## 2023-03-24 LAB — BASIC METABOLIC PANEL
Anion gap: 7 (ref 5–15)
BUN: 18 mg/dL (ref 6–20)
CO2: 24 mmol/L (ref 22–32)
Calcium: 8.6 mg/dL — ABNORMAL LOW (ref 8.9–10.3)
Chloride: 110 mmol/L (ref 98–111)
Creatinine, Ser: 0.91 mg/dL (ref 0.44–1.00)
GFR, Estimated: >60 mL/min (ref >60)
Glucose, Bld: 106 mg/dL — ABNORMAL HIGH (ref 70–99)
Potassium: 4.2 mmol/L (ref 3.5–5.1)
Sodium: 141 mmol/L (ref 135–145)

## 2023-03-24 LAB — CBC
HCT: 34.1 % — ABNORMAL LOW (ref 36.0–46.0)
Hemoglobin: 10.1 g/dL — ABNORMAL LOW (ref 12.0–15.0)
MCH: 22.6 pg — ABNORMAL LOW (ref 26.0–34.0)
MCHC: 29.6 g/dL — ABNORMAL LOW (ref 30.0–36.0)
MCV: 76.5 fL — ABNORMAL LOW (ref 80.0–100.0)
Platelets: 207 10*3/uL (ref 150–400)
RBC: 4.46 MIL/uL (ref 3.87–5.11)
RDW: 16.6 % — ABNORMAL HIGH (ref 11.5–15.5)
WBC: 7.9 10*3/uL (ref 4.0–10.5)
nRBC: 0 % (ref 0.0–0.2)

## 2023-03-24 LAB — HEPATIC FUNCTION PANEL
ALT: 36 U/L (ref 0–44)
AST: 30 U/L (ref 15–41)
Albumin: 2.9 g/dL — ABNORMAL LOW (ref 3.5–5.0)
Alkaline Phosphatase: 92 U/L (ref 38–126)
Bilirubin, Direct: <0.1 mg/dL (ref 0.0–0.2)
Total Bilirubin: 0.5 mg/dL (ref 0.3–1.2)
Total Protein: 6.3 g/dL — ABNORMAL LOW (ref 6.5–8.1)

## 2023-03-24 LAB — CULTURE, BLOOD (ROUTINE X 2)

## 2023-03-24 LAB — URINE CULTURE: Culture: >=100000 — AB

## 2023-03-24 LAB — MAGNESIUM: Magnesium: 1.9 mg/dL (ref 1.7–2.4)

## 2023-03-24 LAB — PROTIME-INR
INR: 2.1 — ABNORMAL HIGH (ref 0.8–1.2)
Prothrombin Time: 23.7 seconds — ABNORMAL HIGH (ref 11.4–15.2)

## 2023-03-24 LAB — PHOSPHORUS: Phosphorus: 3.1 mg/dL (ref 2.5–4.6)

## 2023-03-24 MED ORDER — POLYSACCHARIDE IRON COMPLEX 150 MG PO CAPS: 150.0000 mg | ORAL_CAPSULE | Freq: Every day | ORAL | 2 refills | Status: DC

## 2023-03-24 MED ORDER — CIPROFLOXACIN HCL 500 MG PO TABS: 500.0000 mg | ORAL_TABLET | Freq: Two times a day (BID) | ORAL | 0 refills | Status: AC

## 2023-03-24 MED ORDER — VITAMIN D (ERGOCALCIFEROL) 1.25 MG (50000 UNIT) PO CAPS: 50000.0000 [IU] | ORAL_CAPSULE | ORAL | 0 refills | Status: DC

## 2023-03-24 NOTE — Progress Notes (Signed)
ANTICOAGULATION CONSULT NOTE   Pharmacy Consult for Warfarin  Indication: mechanical aortic valve (On-X valve)  Allergies  Allergen Reactions   Baclofen Hives   Dilaudid [Hydromorphone] Itching   Oxycodone Hcl Itching   Percocet [Oxycodone-Acetaminophen] Hives   Septra [Sulfamethoxazole-Trimethoprim] Hives   Vibramycin [Doxycycline] Itching   Neurontin [Gabapentin] Rash    Patient Measurements: Height: 5\' 7"  (170.2 cm) Weight: 132.5 kg (292 lb 1.8 oz) IBW/kg (Calculated) : 61.6 Heparin Dosing Weight: n/a  Vital Signs: Temp: 98 F (36.7 C) (06/22 0855) BP: 152/84 (06/22 0855) Pulse Rate: 65 (06/22 0855)  Labs: Recent Labs    03/21/23 1953 03/22/23 0510 03/22/23 1532 03/23/23 0504 03/23/23 0905 03/24/23 0417  HGB  --  10.2*  --  11.4*  --  10.1*  HCT  --  34.2*  --  38.8  --  34.1*  PLT  --  169  --  189  --  207  LABPROT  --  21.7*  --   --  22.5* 23.7*  INR  --  1.9*  --   --  2.0* 2.1*  CREATININE  --  1.10*  --  1.01*  --  0.91  TROPONINIHS 51* 56* 22*  --   --   --      Estimated Creatinine Clearance: 102.7 mL/min (by C-G formula based on SCr of 0.91 mg/dL).   Medical History: Past Medical History:  Diagnosis Date   Abscess 06/23/2021   Achilles tendonitis 01/16/2014   Overview:   Followed by [employer].  Treated with brace and diclofenac.   Acute cystitis 09/12/2013   Last Assessment & Plan:   Formatting of this note might be different from the original.  Symptoms most consistent with acute cystitis     -- Lab testing: urine sent for culture and sensitivities  -- Antibiotic: proceed now with prescription for cipro 250 BID x 5 days (risks/benefits discussed).  -- Symptom relief: Use phenazopyridine (AZO) as needed.  Maintain hydration.  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  Follow-up with Dr. Jaynie Collins.  Watch for opportunities to increase physical activity within neuromuscular capabilities.   Chronic back pain 02/13/2018   Chronic pain syndrome 02/02/2016   Closed fracture of sacrum with routine healing 12/22/2021   COVID-19 virus infection 06/23/2021   Depression    Ear pain 03/07/2023   Herniated lumbar disc without myelopathy 10/02/2019   History of bacterial endocarditis 07/05/2022   Hypertension    Insomnia 12/08/2021   Kidney stone    Malodorous urine 05/13/2020   Last Assessment & Plan:   Formatting of this note might be different from the original.  Given no dysuria, urgency and a negative UA, this is unlikely a UTI. Patient advised to drink more water.   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   Transaminitis 06/23/2021   Urinary incontinence 06/03/2010   Vertebral osteomyelitis (HCC)     Medications:  Medications Prior to Admission  Medication Sig Dispense Refill Last Dose   amLODipine (NORVASC) 2.5 MG tablet TAKE 1 TABLET BY MOUTH  EVERY DAY 90 tablet 1 03/20/2023   Ascorbic Acid (VITAMIN C) 500 MG CAPS Take 500 mg by mouth in the morning.   03/20/2023   aspirin EC 81 MG tablet Take 1 tablet (81 mg total) by mouth daily. Swallow whole. 30 tablet 11 03/20/2023   atorvastatin (LIPITOR) 10 MG tablet Take 1 tablet (10 mg total) by mouth every evening. 90 tablet 3 03/20/2023   cholecalciferol (VITAMIN D3) 25 MCG (1000 UT) tablet Take 1,000 Units by mouth in the morning.   03/20/2023   escitalopram (LEXAPRO) 20 MG tablet TAKE 1 TABLET BY MOUTH EVERY DAY 90 tablet 1 03/20/2023   fluticasone (FLONASE) 50 MCG/ACT nasal spray Place 2 sprays into both nostrils daily. 16 g 6 03/20/2023   furosemide (LASIX) 20 MG tablet Take 1 tablet (20 mg  total) by mouth daily. 90 tablet 3 03/20/2023   loperamide (IMODIUM) 2 MG capsule Take 1 capsule (2 mg total) by mouth as needed for diarrhea or loose stools. 30 capsule 0 unk at unk   metoprolol tartrate (LOPRESSOR) 25 MG tablet Take 0.5 tablets (12.5 mg total) by mouth 2 (two) times daily. 90 tablet 3 03/20/2023   Multiple Vitamin (MULTIVITAMIN WITH MINERALS) TABS tablet Take 1 tablet by mouth in the morning.   03/20/2023   naproxen (NAPROSYN) 250 MG tablet Take 1 tablet (250 mg total) by mouth 2 (two) times daily as needed. 60 tablet 1 Past Week at 2000   pantoprazole (PROTONIX) 40 MG tablet Take 1 tablet (40 mg total) by mouth daily. 90 tablet 3 03/20/2023 at 0900   Potassium (POTASSIMIN PO) Take 1 tablet by mouth daily as needed (with lasix).   unk at unk   tiZANidine (ZANAFLEX) 4 MG tablet Take 1 tablet (4 mg total) by mouth at bedtime. 90 tablet 3 03/20/2023 at 2000   warfarin (COUMADIN) 5 MG tablet TAKE 1 TO 1 & 1/2 TABLETS DAILY OR AS DIRECTED BY COUMADIN CLINIC 105 tablet 1 03/20/2023 at 0900   amoxicillin (AMOXIL) 500 MG capsule Take 4 capsules (2000 mg) 30-60 min prior to dental work (Patient not taking: Reported on 03/21/2023) 4 capsule 3 Not Taking   amoxicillin-clavulanate (AUGMENTIN) 875-125 MG tablet Take 1 tablet by mouth 2 (two) times daily. (Patient not taking: Reported on 03/21/2023) 10 tablet 0 Not Taking    Assessment: Pharmacy consulted to dose warfarin in this 52 year old female who presented with headache, N/V, and chills. Found to have E. Coli bacteremia. Patient has a history of mechanical On-X aortic valve replacement in 11/2021. Pt was on warfarin 2.5 mg PO every Monday and warfarin 5 mg PO all other days. Last home dose 6/18 @ 0900. INRs have been therapeutic since 03/01/2023 and the patient's INR goal changed from 2.0-3.0 to 1.5-2.0. Patient is also on ceftriaxone this admission.  DATE INR DOSE  6/19 1.8 7.5mg   6/20  1.9 5 mg  6/21 2.0 5 mg  6/22 2.1 5 mg    Goal of  Therapy:  INR: 1.5-2.0    Plan:  INR is therapeutic. Will continue home dose of warfarin tonight (5 mg). INR daily. CBC every 7 days, H/H stable.   Paschal Dopp, PharmD, BCPS  03/24/2023 9:08 AM

## 2023-03-24 NOTE — Plan of Care (Signed)

## 2023-03-24 NOTE — Discharge Summary (Signed)
[facility] Discharge Summary   Patient: Mary Chambers RKY:[id number]  PCP: de [place], Raymond J, MD  Date of admission: 03/21/2023   Date of discharge:  03/24/2023     Discharge Diagnoses:  Principal Problem:   Acute pyelonephritis Active Problems:   Anxiety and depression   Headache   Hypertension, benign   Migraines   Morbidly obese (HCC)   Gastroesophageal reflux disease   History of gastric ulcer   S/P AVR (aortic valve replacement)   Long term (current) use of anticoagulants   E coli bacteremia   Pyelonephritis   Admitted From: Home Disposition:  Home   Recommendations for Outpatient Follow-up:  F/u with PCP in 1 wk F/uwith urologist on 2 weeks, appt already scheduled Follow up LABS/TEST:     Diet recommendation: Cardiac diet  Activity: The patient is advised to gradually reintroduce usual activities, as tolerated  Discharge Condition: stable  Code Status: Full code   History of present illness: As per the H and P dictated on admission Hospital Course:  Mary Chambers is a 52 y.o. 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 Pt states she woke up with fever and chills and later found out her BP was low at 80 systolic. She also reports intermittent Irritable bowel with diarrhea no blood noted in stool.  Denies any abdominal pain, no dysuria. ED course: patient meets sepsis criteria heart rate of 129 respirations of 23 leukocytosis 13.3 mild troponin elevation from 19-51, nitrite positive urine, mild AKI with a serum creatinine of 1.14 EGFR 58 normal LFTs. Lactic acid of 1.5 and repeat of 1.7.  Head CT done in the emergency room is negative,  CT abdomen and pelvis with contrast showed an area in the right kidney about 3.1 cm that is hypodense concerning for pyelonephritis or neoplasm with recommendations for MRI.  EKG  shows sinus tach at 134 PR 140 QTc 433. Chest x-ray negative for any acute findings.  Follow blood cultures TRH consulted for admission and further management as below.   Assessment and Plan: # Sepsis secondary to UTI/right pyelonephritis, E. coli bacteremia UA positive, urine culture growing E. Coli, Blood culture growing E. Coli.  Sensitive to ceftriaxone and Qunolone S/p ceftriaxone 2 g IV daily given during hospital stay. MRI consistent with right pyelonephritis. 6/21 repeat blood cultures negative for 2 days.  ID was consulted, recommended to discharge patient on ciprofloxacin 40 mg p.o. twice daily for 7 days.  Patient was advised to follow with the urologist as an outpatient. # H/o endocarditis s/p mechanical aortic valve replacement Continue Coumadin, pharmacy consulted to monitor INR.  Patient was advised to monitor INR closely at home due to ciprofloxacin interaction with Coumadin.  Follow-up with PCP in 1 week # HTN, HLD, Continue Lopressor 12.5 twice daily, Lipitor 10 mg nightly.  Monitor BP at home. # Elevated troponin most likely due to demand ischemia, patient denies any chest pain. Troponin peaked 56, trended down. Continued aspirin 81 mg p.o. daily. TTE shows LVEF 50 to 55% grade 1 diastolic dysfunction.  No any other significant findings. # Transaminitis most likely due to sepsis.  LFTs improved. # Iron deficiency anemia, transferrin saturation 2%, avoided IV iron due to sepsis and pyelonephritis Started oral iron supplement with vitamin C.  Follow with PCP to repeat iron profile after 3 to 6 months # Vitamin D deficiency: started vitamin D 50,000 units p.o. weekly,  follow with PCP to repeat vitamin D level after 3 to 6 months. # Morbid obesity Body mass index is 45.75 kg/m.  Interventions: Calorie restricted diet and daily exercise advised to lose body weight.  Lifestyle modification discussed.  Patient was ambulatory without any assistance. On the day of the discharge the  patient's vitals were stable, and no other acute medical condition were reported by patient. the patient was felt safe to be discharge at Home .  Consultants: ID salted, discussed with urology, recommended to follow as an outpatient Procedures: None  Discharge Exam: General: Appear in no distress, no Rash; Oral Mucosa Clear, moist. Cardiovascular: S1 and S2 Present, no Murmur, Respiratory: normal respiratory effort, Bilateral Air entry present and no Crackles, no wheezes Abdomen: Bowel Sound present, Soft and no tenderness, no hernia Extremities: no Pedal edema, no calf tenderness Neurology: alert and oriented to time, place, and person affect appropriate.  Filed Weights   03/21/23 1355 03/22/23 1100 03/23/23 0143  Weight: 59 kg 128.8 kg 132.5 kg   Vitals:   03/24/23 0325 03/24/23 0855  BP: (!) 144/80 (!) 152/84  Pulse: (!) 59 65  Resp: 12 18  Temp: 98 F (36.7 C) 98 F (36.7 C)  SpO2: 95% 98%    DISCHARGE MEDICATION: Allergies as of 03/24/2023       Reactions   Baclofen Hives   Dilaudid [hydromorphone] Itching   Oxycodone Hcl Itching   Percocet [oxycodone-acetaminophen] Hives   Septra [sulfamethoxazole-trimethoprim] Hives   Vibramycin [doxycycline] Itching   Neurontin [gabapentin] Rash        Medication List     STOP taking these medications    amoxicillin 500 MG capsule Commonly known as: AMOXIL   amoxicillin-clavulanate 875-125 MG tablet Commonly known as: AUGMENTIN   cholecalciferol 25 MCG (1000 UNIT) tablet Commonly known as: VITAMIN D3       TAKE these medications    amLODipine 2.5 MG tablet Commonly known as: NORVASC TAKE 1 TABLET BY MOUTH EVERY DAY   aspirin EC 81 MG tablet Take 1 tablet (81 mg total) by mouth daily. Swallow whole.   atorvastatin 10 MG tablet Commonly known as: LIPITOR Take 1 tablet (10 mg total) by mouth every evening.   ciprofloxacin 500 MG tablet Commonly known as: CIPRO Take 1 tablet (500 mg total) by mouth 2  (two) times daily for 7 days.   escitalopram 20 MG tablet Commonly known as: LEXAPRO TAKE 1 TABLET BY MOUTH EVERY DAY   fluticasone 50 MCG/ACT nasal spray Commonly known as: FLONASE Place 2 sprays into both nostrils daily.   furosemide 20 MG tablet Commonly known as: LASIX Take 1 tablet (20 mg total) by mouth daily.   iron polysaccharides 150 MG capsule Commonly known as: NIFEREX Take 1 capsule (150 mg total) by mouth daily. Start taking on: March 25, 2023   loperamide 2 MG capsule Commonly known as: IMODIUM Take 1 capsule (2 mg total) by mouth as needed for diarrhea or loose stools.   metoprolol tartrate 25 MG tablet Commonly known as: LOPRESSOR Take 0.5 tablets (12.5 mg total) by mouth 2 (two) times daily.   multivitamin with minerals Tabs tablet Take 1 tablet by mouth in the morning.   naproxen 250 MG tablet Commonly known as: NAPROSYN Take 1 tablet (250 mg total) by mouth 2 (two) times daily as needed.   pantoprazole 40 MG tablet Commonly known as: PROTONIX Take 1 tablet (40 mg total) by mouth daily.   POTASSIMIN PO Take 1 tablet by mouth  daily as needed (with lasix).   tiZANidine 4 MG tablet Commonly known as: ZANAFLEX Take 1 tablet (4 mg total) by mouth at bedtime.   Vitamin C 500 MG Caps Take 500 mg by mouth in the morning.   Vitamin D (Ergocalciferol) 1.25 MG (50000 UNIT) Caps capsule Commonly known as: DRISDOL Take 1 capsule (50,000 Units total) by mouth every 7 (seven) days. Start taking on: March 29, 2023   warfarin 5 MG tablet Commonly known as: COUMADIN Take as directed. If you are unsure how to take this medication, talk to your nurse or doctor. Original instructions: TAKE 1 TO 1 & 1/2 TABLETS DAILY OR AS DIRECTED BY COUMADIN CLINIC       Allergies  Allergen Reactions   Baclofen Hives   Dilaudid [Hydromorphone] Itching   Oxycodone Hcl Itching   Percocet [Oxycodone-Acetaminophen] Hives   Septra [Sulfamethoxazole-Trimethoprim] Hives    Vibramycin [Doxycycline] Itching   Neurontin [Gabapentin] Rash   Discharge Instructions     Call MD for:  extreme fatigue   Complete by: As directed    Call MD for:  persistant dizziness or light-headedness   Complete by: As directed    Call MD for:  persistant nausea and vomiting   Complete by: As directed    Call MD for:  severe uncontrolled pain   Complete by: As directed    Call MD for:  temperature >100.4   Complete by: As directed    Diet - low sodium heart healthy   Complete by: As directed    Discharge instructions   Complete by: As directed    F/u with PCP in 1 wk F/uwith urologist on 2 weeks, appt already scheduled   Increase activity slowly   Complete by: As directed        The results of significant diagnostics from this hospitalization (including imaging, microbiology, ancillary and laboratory) are listed below for reference.    Significant Diagnostic Studies: ECHOCARDIOGRAM COMPLETE  Result Date: 03/23/2023    ECHOCARDIOGRAM REPORT   Patient Name:   MONROE TOURE Date of Exam: 03/23/2023 Medical Rec #:  [id number]          Height:       67.0 in Accession #:    [id number]         Weight:       292.1 lb Date of Birth:  02-05-1971          BSA:          2.375 m Patient Age:    51 years           BP:           129/66 mmHg Patient Gender: F                  HR:           73 bpm. Exam Location:  [facility] Procedure: 2D Echo, Cardiac Doppler and Color Doppler Indications:     Elevated troponin  History:         Patient has prior history of Echocardiogram examinations, most                  recent 12/19/2021. Signs/Symptoms:Edema and Bacteremia; Risk                  Factors:Hypertension and Sleep Apnea. There is a 23 mm ON-X                  prosthetic valve in the  Aortic position which was placed in                  11/2021.  Sonographer:     Mikki Harbor Referring Phys:  [id number] Gillis Santa Diagnosing Phys: Julien Nordmann MD  Sonographer Comments: Technically difficult study  due to poor echo windows and patient is obese. IMPRESSIONS  1. Left ventricular ejection fraction, by estimation, is 50 to 55%. The left ventricle has low normal function. The left ventricle has no regional wall motion abnormalities. Left ventricular diastolic parameters are consistent with Grade I diastolic dysfunction (impaired relaxation).  2. Right ventricular systolic function is normal. The right ventricular size is normal. There is normal pulmonary artery systolic pressure. The estimated right ventricular systolic pressure is 25.5 mmHg.  3. The mitral valve is normal in structure. No evidence of mitral valve regurgitation. No evidence of mitral stenosis.  4. The aortic valve has been repaired/replaced. 23 mm On-X valve present. Aortic valve regurgitation is not visualized. Aortic valve sclerosis/calcification is present, without any evidence of aortic stenosis. Aortic valve mean gradient measures 12.0 mmHg.  5. The inferior vena cava is normal in size with greater than 50% respiratory variability, suggesting right atrial pressure of 3 mmHg. FINDINGS  Left Ventricle: Left ventricular ejection fraction, by estimation, is 50 to 55%. The left ventricle has low normal function. The left ventricle has no regional wall motion abnormalities. The left ventricular internal cavity size was normal in size. There is no left ventricular hypertrophy. Left ventricular diastolic parameters are consistent with Grade I diastolic dysfunction (impaired relaxation). Right Ventricle: The right ventricular size is normal. No increase in right ventricular wall thickness. Right ventricular systolic function is normal. There is normal pulmonary artery systolic pressure. The tricuspid regurgitant velocity is 2.37 m/s, and  with an assumed right atrial pressure of 3 mmHg, the estimated right ventricular systolic pressure is 25.5 mmHg. Left Atrium: Left atrial size was normal in size. Right Atrium: Right atrial size was normal in size.  Pericardium: There is no evidence of pericardial effusion. Mitral Valve: The mitral valve is normal in structure. No evidence of mitral valve regurgitation. No evidence of mitral valve stenosis. MV peak gradient, 3.5 mmHg. The mean mitral valve gradient is 1.0 mmHg. Tricuspid Valve: The tricuspid valve is normal in structure. Tricuspid valve regurgitation is mild . No evidence of tricuspid stenosis. Aortic Valve: The aortic valve has been repaired/replaced. Aortic valve regurgitation is not visualized. Aortic valve sclerosis/calcification is present, without any evidence of aortic stenosis. Aortic valve mean gradient measures 12.0 mmHg. Aortic valve  peak gradient measures 20.1 mmHg. Aortic valve area, by VTI measures 3.09 cm. Pulmonic Valve: The pulmonic valve was normal in structure. Pulmonic valve regurgitation is not visualized. No evidence of pulmonic stenosis. Aorta: The aortic root is normal in size and structure. Venous: The inferior vena cava is normal in size with greater than 50% respiratory variability, suggesting right atrial pressure of 3 mmHg. IAS/Shunts: No atrial level shunt detected by color flow Doppler.  LEFT VENTRICLE PLAX 2D LVIDd:         5.10 cm      Diastology LVIDs:         3.20 cm      LV e' medial:    9.25 cm/s LV PW:         1.20 cm      LV E/e' medial:  9.4 LV IVS:        1.20 cm  LV e' lateral:   12.40 cm/s LVOT diam:     2.00 cm      LV E/e' lateral: 7.0 LV SV:         147 LV SV Index:   62 LVOT Area:     3.14 cm  LV Volumes (MOD) LV vol d, MOD A2C: 58.1 ml LV vol d, MOD A4C: 107.0 ml LV vol s, MOD A2C: 36.4 ml LV vol s, MOD A4C: 41.5 ml LV SV MOD A2C:     21.7 ml LV SV MOD A4C:     107.0 ml LV SV MOD BP:      44.2 ml RIGHT VENTRICLE RV Basal diam:  4.30 cm RV S prime:     9.14 cm/s LEFT ATRIUM             Index        RIGHT ATRIUM           Index LA diam:        4.10 cm 1.73 cm/m   RA Area:     15.10 cm LA Vol (A2C):   62.2 ml 26.18 ml/m  RA Volume:   36.90 ml  15.53 ml/m  LA Vol (A4C):   49.9 ml 21.01 ml/m LA Biplane Vol: 57.5 ml 24.21 ml/m  AORTIC VALVE                     PULMONIC VALVE AV Area (Vmax):    2.83 cm      PV Vmax:       0.77 m/s AV Area (Vmean):   3.04 cm      PV Peak grad:  2.4 mmHg AV Area (VTI):     3.09 cm AV Vmax:           224.00 cm/s AV Vmean:          160.000 cm/s AV VTI:            0.476 m AV Peak Grad:      20.1 mmHg AV Mean Grad:      12.0 mmHg LVOT Vmax:         202.00 cm/s LVOT Vmean:        155.000 cm/s LVOT VTI:          0.468 m LVOT/AV VTI ratio: 0.98  AORTA Ao Root diam: 2.70 cm Ao Asc diam:  3.40 cm MITRAL VALVE               TRICUSPID VALVE MV Area (PHT): 3.65 cm    TR Peak grad:   22.5 mmHg MV Area VTI:   4.82 cm    TR Vmax:        237.00 cm/s MV Peak grad:  3.5 mmHg MV Mean grad:  1.0 mmHg    SHUNTS MV Vmax:       0.93 m/s    Systemic VTI:  0.47 m MV Vmean:      52.7 cm/s   Systemic Diam: 2.00 cm MV Decel Time: 208 msec MV E velocity: 86.50 cm/s MV A velocity: 83.80 cm/s MV E/A ratio:  1.03 Julien Nordmann MD Electronically signed by Julien Nordmann MD Signature Date/Time: 03/23/2023/4:25:20 PM    Final    MR ABDOMEN W WO CONTRAST  Result Date: 03/22/2023 CLINICAL DATA:  Characterize right renal lesion identified by prior CT suspected pyelonephritis versus renal neoplasm EXAM: MRI ABDOMEN WITHOUT AND WITH CONTRAST TECHNIQUE: Multiplanar multisequence MR imaging of the abdomen was performed both before  and after the administration of intravenous contrast. CONTRAST:  6mL GADAVIST GADOBUTROL 1 MMOL/ML IV SOLN COMPARISON:  CT abdomen pelvis, 03/21/2023 FINDINGS: Examination is generally limited by breath motion artifact. Lower chest: No acute abnormality. Hepatobiliary: No focal liver abnormality is seen. Status post cholecystectomy. No biliary dilatation. Pancreas: Unremarkable. No pancreatic ductal dilatation or surrounding inflammatory changes. Spleen: Normal in size without significant abnormality. Adrenals/Urinary Tract: Adrenal glands are  unremarkable. Very subtle, intrinsically T2 hyperintense, T1 hypointense lesion of the inferior pole of the right kidney, which is minimally hypoenhancing (series 4, image 22, series 22, image 43). This measures 4.2 x 3.2 cm (series 4, image 22). Bilateral perinephric fat stranding and fluid. Mildly atrophic left kidney. Simple, benign left renal cortical cysts, for which no further follow-up or characterization is required. Kidneys are otherwise normal, without obvious renal calculi, solid lesion, or hydronephrosis. Stomach/Bowel: Stomach is within normal limits. No evidence of bowel wall thickening, distention, or inflammatory changes. Vascular/Lymphatic: No significant vascular findings are present. No enlarged abdominal lymph nodes. Other: No abdominal wall hernia or abnormality. No ascites. Musculoskeletal: No acute or significant osseous findings. IMPRESSION: 1. Very subtle, minimally hypoenhancing lesion of the inferior pole of the right kidney corresponding to finding prior CT. This measures 4.2 x 3.2 cm and is most consistent with pyelonephritis. 2. Bilateral perinephric fat stranding and fluid, infectious or inflammatory. 3. Mildly atrophic left kidney. 4. No hydronephrosis. 5. Status post cholecystectomy. Electronically Signed   By: Jearld Lesch M.D.   On: 03/22/2023 11:20   CT Head Wo Contrast  Result Date: 03/21/2023 CLINICAL DATA:  Headache EXAM: CT HEAD WITHOUT CONTRAST TECHNIQUE: Contiguous axial images were obtained from the base of the skull through the vertex without intravenous contrast. RADIATION DOSE REDUCTION: This exam was performed according to the departmental dose-optimization program which includes automated exposure control, adjustment of the mA and/or kV according to patient size and/or use of iterative reconstruction technique. COMPARISON:  03/06/2023 FINDINGS: Brain: There are streak artifacts partially obscuring the posterior fossa, caused by the earrings. There is no mass,  hemorrhage or extra-axial collection. The size and configuration of the ventricles and extra-axial CSF spaces are normal. The brain parenchyma is normal, without acute or chronic infarction. Vascular: No abnormal hyperdensity of the major intracranial arteries or dural venous sinuses. No intracranial atherosclerosis. Skull: The visualized skull base, calvarium and extracranial soft tissues are normal. Sinuses/Orbits: No fluid levels or advanced mucosal thickening of the visualized paranasal sinuses. No mastoid or middle ear effusion. The orbits are normal. IMPRESSION: Normal head CT. Electronically Signed   By: Deatra Robinson M.D.   On: 03/21/2023 22:14   DG Chest 1 View  Result Date: 03/21/2023 CLINICAL DATA:  Chest pain. EXAM: CHEST  1 VIEW COMPARISON:  02/14/2022 FINDINGS: Stable surgical changes with median sternotomy wires and lower thoracic spinal fusion hardware. The heart is normal in size. No acute pulmonary findings. No pleural effusions or pulmonary lesions. The bony thorax is intact. IMPRESSION: No acute cardiopulmonary findings. Electronically Signed   By: Rudie Meyer M.D.   On: 03/21/2023 17:48   CT ABDOMEN PELVIS W CONTRAST  Result Date: 03/21/2023 CLINICAL DATA:  Right lower quadrant pain EXAM: CT ABDOMEN AND PELVIS WITH CONTRAST TECHNIQUE: Multidetector CT imaging of the abdomen and pelvis was performed using the standard protocol following bolus administration of intravenous contrast. RADIATION DOSE REDUCTION: This exam was performed according to the departmental dose-optimization program which includes automated exposure control, adjustment of the mA and/or kV according to patient  size and/or use of iterative reconstruction technique. CONTRAST:  OMNIPAQUE IOHEXOL 300 MG/ML  SOLN COMPARISON:  CT abdomen and pelvis 01/17/2023 FINDINGS: Lower chest: No acute abnormality. Hepatobiliary: No focal liver abnormality is seen. Status post cholecystectomy. No biliary dilatation. Pancreas:  Unremarkable. No pancreatic ductal dilatation or surrounding inflammatory changes. Spleen: Normal in size without focal abnormality. Adrenals/Urinary Tract: Left renal atrophy is unchanged. There is cortical scarring in both kidneys. There is no hydronephrosis or urinary tract calculus identified. Vague focal hypodense area seen in the inferior pole the right kidney measuring 2.7 x 3.1 cm image 2/50. The adrenal glands and bladder are within normal limits. Stomach/Bowel: Stomach is within normal limits. Appendix appears normal. No evidence of bowel wall thickening, distention, or inflammatory changes. There is sigmoid colon diverticulosis. Vascular/Lymphatic: No significant vascular findings are present. No enlarged abdominal or pelvic lymph nodes. Reproductive: Status post hysterectomy. No adnexal masses. Other: No abdominal wall hernia or abnormality. No abdominopelvic ascites. Musculoskeletal: Posterior fusion hardware seen at L1-L2 and T10-T11. IMPRESSION: 1. Vague hypodense area in the inferior pole of the right kidney measuring 3.1 cm. Findings may represent focal pyelonephritis or renal neoplasm. Further evaluation with MRI recommended. 2. No other acute localizing process in the abdomen or pelvis. 3. Stable left renal atrophy. 4. Sigmoid colon diverticulosis. Electronically Signed   By: Darliss Cheney M.D.   On: 03/21/2023 16:26   CT Head Wo Contrast  Result Date: 03/06/2023 CLINICAL DATA:  New onset headaches EXAM: CT HEAD WITHOUT CONTRAST TECHNIQUE: Contiguous axial images were obtained from the base of the skull through the vertex without intravenous contrast. RADIATION DOSE REDUCTION: This exam was performed according to the departmental dose-optimization program which includes automated exposure control, adjustment of the mA and/or kV according to patient size and/or use of iterative reconstruction technique. COMPARISON:  04/17/2015 FINDINGS: Brain: No evidence of acute infarction, hemorrhage,  hydrocephalus, extra-axial collection or mass lesion/mass effect. Vascular: No hyperdense vessel or unexpected calcification. Skull: Normal. Negative for fracture or focal lesion. Sinuses/Orbits: No acute finding. Other: None. IMPRESSION: No acute intracranial abnormality noted. Electronically Signed   By: Alcide Clever M.D.   On: 03/06/2023 03:48    Microbiology: Recent Results (from the past 240 hour(s))  Urine Culture     Status: Abnormal   Collection Time: 03/21/23  3:14 PM   Specimen: Urine, Random  Result Value Ref Range Status   Specimen Description   Final    URINE, RANDOM Performed at [facility], [address] [place], [place] [address]    Special Requests   Final    NONE Reflexed from [id number] Performed at [facility], [address] [place], [place] [address]    Culture >=100,000 COLONIES/mL ESCHERICHIA COLI (A)  Final   Report Status 03/24/2023 FINAL  Final   Organism ID, Bacteria ESCHERICHIA COLI (A)  Final      Susceptibility   Escherichia coli - MIC*    AMPICILLIN >=32 RESISTANT Resistant     CEFAZOLIN <=4 SENSITIVE Sensitive     CEFEPIME <=0.12 SENSITIVE Sensitive     CEFTRIAXONE <=0.25 SENSITIVE Sensitive     CIPROFLOXACIN <=0.25 SENSITIVE Sensitive     GENTAMICIN <=1 SENSITIVE Sensitive     IMIPENEM <=0.25 SENSITIVE Sensitive     NITROFURANTOIN <=16 SENSITIVE Sensitive     TRIMETH/SULFA <=20 SENSITIVE Sensitive     AMPICILLIN/SULBACTAM >=32 RESISTANT Resistant     PIP/TAZO <=4 SENSITIVE Sensitive     * >=100,000 COLONIES/mL ESCHERICHIA COLI  Resp  panel by RT-PCR (RSV, Flu A&B, Covid) Anterior Nasal Swab     Status: None   Collection Time: 03/21/23  9:37 PM   Specimen: Anterior Nasal Swab  Result Value Ref Range Status   SARS Coronavirus 2 by RT PCR NEGATIVE NEGATIVE Final    Comment: (NOTE) SARS-CoV-2 target nucleic acids are NOT DETECTED.  The SARS-CoV-2 RNA is generally detectable in upper respiratory specimens during the acute  phase of infection. The lowest concentration of SARS-CoV-2 viral copies this assay can detect is 138 copies/mL. A negative result does not preclude SARS-Cov-2 infection and should not be used as the sole basis for treatment or other patient management decisions. A negative result may occur with  improper specimen collection/handling, submission of specimen other than nasopharyngeal swab, presence of viral mutation(s) within the areas targeted by this assay, and inadequate number of viral copies(<138 copies/mL). A negative result must be combined with clinical observations, patient history, and epidemiological information. The expected result is Negative.  Fact Sheet for Patients:  BloggerCourse.com  Fact Sheet for Healthcare Providers:  SeriousBroker.it  This test is no t yet approved or cleared by the [place] FDA and  has been authorized for detection and/or diagnosis of SARS-CoV-2 by FDA under an Emergency Use Authorization (EUA). This EUA will remain  in effect (meaning this test can be used) for the duration of the COVID-19 declaration under Section 564(b)(1) of the Act, 21 U.S.C.section 360bbb-3(b)(1), unless the authorization is terminated  or revoked sooner.       Influenza A by PCR NEGATIVE NEGATIVE Final   Influenza B by PCR NEGATIVE NEGATIVE Final    Comment: (NOTE) The Xpert Xpress SARS-CoV-2/FLU/RSV plus assay is intended as an aid in the diagnosis of influenza from Nasopharyngeal swab specimens and should not be used as a sole basis for treatment. Nasal washings and aspirates are unacceptable for Xpert Xpress SARS-CoV-2/FLU/RSV testing.  Fact Sheet for Patients: BloggerCourse.com  Fact Sheet for Healthcare Providers: SeriousBroker.it  This test is not yet approved or cleared by the [place] FDA and has been authorized for detection and/or diagnosis of  SARS-CoV-2 by FDA under an Emergency Use Authorization (EUA). This EUA will remain in effect (meaning this test can be used) for the duration of the COVID-19 declaration under Section 564(b)(1) of the Act, 21 U.S.C. section 360bbb-3(b)(1), unless the authorization is terminated or revoked.     Resp Syncytial Virus by PCR NEGATIVE NEGATIVE Final    Comment: (NOTE) Fact Sheet for Patients: BloggerCourse.com  Fact Sheet for Healthcare Providers: SeriousBroker.it  This test is not yet approved or cleared by the [place] FDA and has been authorized for detection and/or diagnosis of SARS-CoV-2 by FDA under an Emergency Use Authorization (EUA). This EUA will remain in effect (meaning this test can be used) for the duration of the COVID-19 declaration under Section 564(b)(1) of the Act, 21 U.S.C. section 360bbb-3(b)(1), unless the authorization is terminated or revoked.  Performed at [facility], [address] [place], [place] [address]   Blood Culture (routine x 2)     Status: Abnormal   Collection Time: 03/21/23  9:37 PM   Specimen: BLOOD  Result Value Ref Range Status   Specimen Description   Final    BLOOD RIGHT ASSIST CONTROL Performed at [facility], [address], [place], [place] [address]    Special Requests   Final    BOTTLES DRAWN AEROBIC AND ANAEROBIC Blood Culture adequate volume Performed at [facility], [address] [place], [place] [address]    Culture  Setup Time   Final    GRAM NEGATIVE RODS IN BOTH AEROBIC AND ANAEROBIC BOTTLES CRITICAL RESULT CALLED TO, READ BACK BY AND VERIFIED WITH: Drusilla Kanner 03/22/23 1914 MW Performed at [facility], [address] [place], [place] [address]    Culture ESCHERICHIA COLI (A)  Final   Report Status 03/24/2023 FINAL  Final   Organism ID, Bacteria ESCHERICHIA COLI  Final      Susceptibility   Escherichia coli - MIC*     AMPICILLIN >=32 RESISTANT Resistant     CEFEPIME <=0.12 SENSITIVE Sensitive     CEFTAZIDIME <=1 SENSITIVE Sensitive     CEFTRIAXONE <=0.25 SENSITIVE Sensitive     CIPROFLOXACIN <=0.25 SENSITIVE Sensitive     GENTAMICIN <=1 SENSITIVE Sensitive     IMIPENEM <=0.25 SENSITIVE Sensitive     TRIMETH/SULFA <=20 SENSITIVE Sensitive     AMPICILLIN/SULBACTAM >=32 RESISTANT Resistant     PIP/TAZO <=4 SENSITIVE Sensitive     * ESCHERICHIA COLI  Blood Culture ID Panel (Reflexed)     Status: Abnormal   Collection Time: 03/21/23  9:37 PM  Result Value Ref Range Status   Enterococcus faecalis NOT DETECTED NOT DETECTED Final   Enterococcus Faecium NOT DETECTED NOT DETECTED Final   Listeria monocytogenes NOT DETECTED NOT DETECTED Final   Staphylococcus species NOT DETECTED NOT DETECTED Final   Staphylococcus aureus (BCID) NOT DETECTED NOT DETECTED Final   Staphylococcus epidermidis NOT DETECTED NOT DETECTED Final   Staphylococcus lugdunensis NOT DETECTED NOT DETECTED Final   Streptococcus species NOT DETECTED NOT DETECTED Final   Streptococcus agalactiae NOT DETECTED NOT DETECTED Final   Streptococcus pneumoniae NOT DETECTED NOT DETECTED Final   Streptococcus pyogenes NOT DETECTED NOT DETECTED Final   A.calcoaceticus-baumannii NOT DETECTED NOT DETECTED Final   Bacteroides fragilis NOT DETECTED NOT DETECTED Final   Enterobacterales DETECTED (A) NOT DETECTED Final    Comment: Enterobacterales represent a large order of gram negative bacteria, not a single organism. CRITICAL RESULT CALLED TO, READ BACK BY AND VERIFIED WITH: [facility] MITCHELL 03/22/23 0953 MW    Enterobacter cloacae complex NOT DETECTED NOT DETECTED Final   Escherichia coli DETECTED (A) NOT DETECTED Final    Comment: CRITICAL RESULT CALLED TO, READ BACK BY AND VERIFIED WITH: [facility] MITCHELL 03/22/23 0953 MW    Klebsiella aerogenes NOT DETECTED NOT DETECTED Final   Klebsiella oxytoca NOT DETECTED NOT DETECTED Final   Klebsiella pneumoniae  NOT DETECTED NOT DETECTED Final   Proteus species NOT DETECTED NOT DETECTED Final   Salmonella species NOT DETECTED NOT DETECTED Final   Serratia marcescens NOT DETECTED NOT DETECTED Final   Haemophilus influenzae NOT DETECTED NOT DETECTED Final   Neisseria meningitidis NOT DETECTED NOT DETECTED Final   Pseudomonas aeruginosa NOT DETECTED NOT DETECTED Final   Stenotrophomonas maltophilia NOT DETECTED NOT DETECTED Final   Candida albicans NOT DETECTED NOT DETECTED Final   Candida auris NOT DETECTED NOT DETECTED Final   Candida glabrata NOT DETECTED NOT DETECTED Final   Candida krusei NOT DETECTED NOT DETECTED Final   Candida parapsilosis NOT DETECTED NOT DETECTED Final   Candida tropicalis NOT DETECTED NOT DETECTED Final   Cryptococcus neoformans/gattii NOT DETECTED NOT DETECTED Final   CTX-M ESBL NOT DETECTED NOT DETECTED Final   Carbapenem resistance IMP NOT DETECTED NOT DETECTED Final   Carbapenem resistance KPC NOT DETECTED NOT DETECTED Final   Carbapenem resistance NDM NOT DETECTED NOT DETECTED Final   Carbapenem  resist OXA 48 LIKE NOT DETECTED NOT DETECTED Final   Carbapenem resistance VIM NOT DETECTED NOT DETECTED Final    Comment: Performed at [facility], [address] [place], [place] [address]  Blood Culture (routine x 2)     Status: Abnormal   Collection Time: 03/21/23  9:45 PM   Specimen: BLOOD  Result Value Ref Range Status   Specimen Description   Final    BLOOD LEFT HAND Performed at [facility], [address] [place], [place] [address]    Special Requests   Final    BOTTLES DRAWN AEROBIC AND ANAEROBIC Blood Culture results may not be optimal due to an excessive volume of blood received in culture bottles Performed at [facility], [address] [place], [place] [address]    Culture  Setup Time   Final    GRAM NEGATIVE RODS IN BOTH AEROBIC AND ANAEROBIC BOTTLES CRITICAL RESULT CALLED TO, READ BACK BY AND VERIFIED WITH: Drusilla Kanner 03/22/23 5784 MW Performed at [facility], [address] [place], [place] [address]    Culture (A)  Final    ESCHERICHIA COLI SUSCEPTIBILITIES PERFORMED ON PREVIOUS CULTURE WITHIN THE LAST 5 DAYS. Performed at [facility], [address], [place], [place] [address]    Report Status 03/24/2023 FINAL  Final  Culture, blood (Routine X 2) w Reflex to ID Panel     Status: None (Preliminary result)   Collection Time: 03/23/23  5:05 AM   Specimen: BLOOD  Result Value Ref Range Status   Specimen Description BLOOD RIGHT HAND  Final   Special Requests   Final    BOTTLES DRAWN AEROBIC AND ANAEROBIC Blood Culture results may not be optimal due to an excessive volume of blood received in culture bottles   Culture   Final    NO GROWTH < 24 HOURS Performed at [facility], [address] [place], [place] [address]    Report Status PENDING  Incomplete  Culture, blood (Routine X 2) w Reflex to ID Panel     Status: None (Preliminary result)   Collection Time: 03/23/23  5:17 AM   Specimen: BLOOD  Result Value Ref Range Status   Specimen Description BLOOD LEFT ARM  Final   Special Requests   Final    BOTTLES DRAWN AEROBIC AND ANAEROBIC Blood Culture results may not be optimal due to an excessive volume of blood received in culture bottles   Culture   Final    NO GROWTH < 24 HOURS Performed at [facility], [address] [place], [place] [address]    Report Status PENDING  Incomplete     Labs: CBC: Recent Labs  Lab 03/21/23 1358 03/22/23 0510 03/23/23 0504 03/24/23 0417  WBC 13.3* 10.4 10.0 7.9  HGB 12.6 10.2* 11.4* 10.1*  HCT 41.0 34.2* 38.8 34.1*  MCV 75.1* 78.1* 78.5* 76.5*  PLT 252 169 189 207   Basic Metabolic Panel: Recent Labs  Lab 03/21/23 1448 03/22/23 0510 03/23/23 0504 03/24/23 0417  NA 138 140 140 141  K 3.6 4.3 4.7 4.2  CL 105 111 109 110  CO2 21* 24 24 24   GLUCOSE 120* 116* 120* 106*  BUN 22* 20 18 18    CREATININE 1.14* 1.10* 1.01* 0.91  CALCIUM 8.2* 7.7* 8.5* 8.6*  MG  --  1.9 2.0 1.9  PHOS  --  4.3 2.5 3.1   Liver Function Tests: Recent Labs  Lab 03/21/23 1448 03/22/23 0510 03/24/23  0417  AST 23 104* 30  ALT 20 60* 36  ALKPHOS 80 111 92  BILITOT 1.0 0.7 0.5  PROT 7.4 5.7* 6.3*  ALBUMIN 4.0 2.9* 2.9*   Recent Labs  Lab 03/21/23 1448  LIPASE 30   No results for input(s): "AMMONIA" in the last 168 hours. Cardiac Enzymes: No results for input(s): "CKTOTAL", "CKMB", "CKMBINDEX", "TROPONINI" in the last 168 hours. BNP (last 3 results) No results for input(s): "BNP" in the last 8760 hours. CBG: No results for input(s): "GLUCAP" in the last 168 hours.  Time spent: 35 minutes  Signed:  Gillis Santa  [facility] 03/24/2023 11:29 AM

## 2023-03-24 NOTE — Plan of Care (Signed)
  Problem: Education: Goal: Knowledge of General Education information will improve Description: Including pain rating scale, medication(s)/side effects and non-pharmacologic comfort measures 03/24/2023 1305 by Harrietta Guardian, RN Outcome: Adequate for Discharge 03/24/2023 1134 by Harrietta Guardian, RN Outcome: Progressing   Problem: Health Behavior/Discharge Planning: Goal: Ability to manage health-related needs will improve 03/24/2023 1305 by Harrietta Guardian, RN Outcome: Adequate for Discharge 03/24/2023 1134 by Harrietta Guardian, RN Outcome: Progressing   Problem: Clinical Measurements: Goal: Ability to maintain clinical measurements within normal limits will improve 03/24/2023 1305 by Harrietta Guardian, RN Outcome: Adequate for Discharge 03/24/2023 1134 by Harrietta Guardian, RN Outcome: Progressing Goal: Will remain free from infection 03/24/2023 1305 by Harrietta Guardian, RN Outcome: Adequate for Discharge 03/24/2023 1134 by Harrietta Guardian, RN Outcome: Progressing Goal: Diagnostic test results will improve 03/24/2023 1305 by Harrietta Guardian, RN Outcome: Adequate for Discharge 03/24/2023 1134 by Harrietta Guardian, RN Outcome: Progressing Goal: Respiratory complications will improve 03/24/2023 1305 by Harrietta Guardian, RN Outcome: Adequate for Discharge 03/24/2023 1134 by Harrietta Guardian, RN Outcome: Progressing Goal: Cardiovascular complication will be avoided 03/24/2023 1305 by Harrietta Guardian, RN Outcome: Adequate for Discharge 03/24/2023 1134 by Harrietta Guardian, RN Outcome: Progressing   Problem: Activity: Goal: Risk for activity intolerance will decrease 03/24/2023 1305 by Harrietta Guardian, RN Outcome: Adequate for Discharge 03/24/2023 1134 by Harrietta Guardian, RN Outcome: Progressing   Problem: Nutrition: Goal: Adequate nutrition will be maintained 03/24/2023 1305 by Harrietta Guardian, RN Outcome: Adequate for Discharge 03/24/2023 1134 by Harrietta Guardian, RN Outcome: Progressing    Problem: Coping: Goal: Level of anxiety will decrease 03/24/2023 1305 by Harrietta Guardian, RN Outcome: Adequate for Discharge 03/24/2023 1134 by Harrietta Guardian, RN Outcome: Progressing   Problem: Elimination: Goal: Will not experience complications related to bowel motility 03/24/2023 1305 by Harrietta Guardian, RN Outcome: Adequate for Discharge 03/24/2023 1134 by Harrietta Guardian, RN Outcome: Progressing Goal: Will not experience complications related to urinary retention 03/24/2023 1305 by Harrietta Guardian, RN Outcome: Adequate for Discharge 03/24/2023 1134 by Harrietta Guardian, RN Outcome: Progressing   Problem: Pain Managment: Goal: General experience of comfort will improve 03/24/2023 1305 by Harrietta Guardian, RN Outcome: Adequate for Discharge 03/24/2023 1134 by Harrietta Guardian, RN Outcome: Progressing   Problem: Safety: Goal: Ability to remain free from injury will improve 03/24/2023 1305 by Harrietta Guardian, RN Outcome: Adequate for Discharge 03/24/2023 1134 by Harrietta Guardian, RN Outcome: Progressing   Problem: Skin Integrity: Goal: Risk for impaired skin integrity will decrease 03/24/2023 1305 by Harrietta Guardian, RN Outcome: Adequate for Discharge 03/24/2023 1134 by Harrietta Guardian, RN Outcome: Progressing

## 2023-03-25 LAB — CULTURE, BLOOD (ROUTINE X 2)

## 2023-03-26 ENCOUNTER — Telehealth: Payer: Self-pay

## 2023-03-26 ENCOUNTER — Telehealth (HOSPITAL_BASED_OUTPATIENT_CLINIC_OR_DEPARTMENT_OTHER): Payer: Self-pay

## 2023-03-26 LAB — CULTURE, BLOOD (ROUTINE X 2): Culture: NO GROWTH

## 2023-03-26 NOTE — Telephone Encounter (Signed)
Patient would like you to call her regarding rescheduling INR check.  I offered her availability next week since Thursday is booked, then she asked for you or Abigail to call her.  Thank you

## 2023-03-26 NOTE — Transitions of Care (Post Inpatient/ED Visit) (Signed)
03/26/2023  Name: Mary Chambers MRN: [id number] DOB: [birth year]  Today's TOC FU Call Status: Today's TOC FU Call Status:: Successful TOC FU Call Competed TOC FU Call Complete Date: 03/26/23  Transition Care Management Follow-up Telephone Call Date of Discharge: 03/24/23 Discharge Facility: [facility]) Type of Discharge: Inpatient Admission Primary Inpatient Discharge Diagnosis:: Acute pyelonephritis How have you been since you were released from the hospital?: Better Any questions or concerns?: No  Items Reviewed: Did you receive and understand the discharge instructions provided?: Yes Medications obtained,verified, and reconciled?: Yes (Medications Reviewed) Any new allergies since your discharge?: No Dietary orders reviewed?: Yes Do you have support at home?: Yes  Medications Reviewed Today: Medications Reviewed Today     Reviewed by Merleen Nicely, LPN (Licensed Practical Nurse) on 03/26/23 at 1134  Med List Status: <None>   Medication Order Taking? Sig Documenting Provider Last Dose Status Informant  amLODipine (NORVASC) 2.5 MG tablet [id number] Yes TAKE 1 TABLET BY MOUTH EVERY DAY Jodelle Red, MD Taking Active Self  Ascorbic Acid (VITAMIN C) 500 MG CAPS [id number] Yes Take 500 mg by mouth in the morning. [provider] Taking Active Self  aspirin EC 81 MG tablet [id number] Yes Take 1 tablet (81 mg total) by mouth daily. Swallow whole. Jodelle Red, MD Taking Active Self  atorvastatin (LIPITOR) 10 MG tablet [id number] Yes Take 1 tablet (10 mg total) by mouth every evening. Jodelle Red, MD Taking Active Self  ciprofloxacin (CIPRO) 500 MG tablet [id number] Yes Take 1 tablet (500 mg total) by mouth 2 (two) times daily for 7 days. Gillis Santa, MD Taking Active   escitalopram (LEXAPRO) 20 MG tablet [id number] Yes TAKE 1 TABLET BY MOUTH EVERY DAY Novella Olive, FNP Taking Active Self  fluticasone (FLONASE)  50 MCG/ACT nasal spray [id number] Yes Place 2 sprays into both nostrils daily. Daphine Deutscher Mary-Margaret, FNP Taking Active Self  furosemide (LASIX) 20 MG tablet [id number] Yes Take 1 tablet (20 mg total) by mouth daily. Jodelle Red, MD Taking Active Self           Med Note Julieanne Manson, Chantelle Verdi H   Mon Mar 26, 2023 11:34 AM) PRN only    iron polysaccharides (NIFEREX) 150 MG capsule [id number] Yes Take 1 capsule (150 mg total) by mouth daily. Gillis Santa, MD Taking Active   loperamide (IMODIUM) 2 MG capsule [id number] Yes Take 1 capsule (2 mg total) by mouth as needed for diarrhea or loose stools. Rhetta Mura, MD Taking Active Self  metoprolol tartrate (LOPRESSOR) 25 MG tablet [id number] Yes Take 0.5 tablets (12.5 mg total) by mouth 2 (two) times daily. Jodelle Red, MD Taking Active Self  Multiple Vitamin (MULTIVITAMIN WITH MINERALS) TABS tablet [id number] Yes Take 1 tablet by mouth in the morning. [provider] Taking Active Self  naproxen (NAPROSYN) 250 MG tablet [id number] Yes Take 1 tablet (250 mg total) by mouth 2 (two) times daily as needed. de [place], Raymond J, MD Taking Active Self  pantoprazole (PROTONIX) 40 MG tablet [id number] Yes Take 1 tablet (40 mg total) by mouth daily. Jodelle Red, MD Taking Active Self  Potassium (POTASSIMIN PO) [id number] Yes Take 1 tablet by mouth daily as needed (with lasix). [provider] Taking Active Self  tiZANidine (ZANAFLEX) 4 MG tablet [id number] Yes Take 1 tablet (4 mg total) by mouth at bedtime. Novella Olive, FNP Taking Active Self  Vitamin D, Ergocalciferol, (DRISDOL) 1.25 MG (50000 UNIT) CAPS capsule [id number] Yes Take 1 capsule (50,000 Units total) by  mouth every 7 (seven) days. Gillis Santa, MD Taking Active   warfarin (COUMADIN) 5 MG tablet [id number] Yes TAKE 1 TO 1 & 1/2 TABLETS DAILY OR AS DIRECTED BY COUMADIN CLINIC Jodelle Red, MD Taking Active Self           Med Note Berenice Primas Mar 21, 2023  9:51 PM) Current Regimen is 2.5 mg on Mondays and 5 mg all other days            Home Care and Equipment/Supplies: Were Home Health Services Ordered?: No Any new equipment or medical supplies ordered?: No  Functional Questionnaire: Do you need assistance with bathing/showering or dressing?: No Do you need assistance with meal preparation?: No Do you need assistance with eating?: No Do you have difficulty maintaining continence: No Do you need assistance with getting out of bed/getting out of a chair/moving?: No Do you have difficulty managing or taking your medications?: No  Follow up appointments reviewed: PCP Follow-up appointment confirmed?: Yes Date of PCP follow-up appointment?: 03/29/23 Follow-up Provider: Dr Tommi Rumps [place] [facility] Follow-up appointment confirmed?: Yes Date of Specialist follow-up appointment?: 04/11/23 Follow-Up Specialty Provider:: Dr Lonna Cobb Do you need transportation to your follow-up appointment?: No Do you understand care options if your condition(s) worsen?: Yes-patient verbalized understanding    SIGNATURE  Woodfin Ganja LPN [facility] Nurse Health Advisor Direct Dial [phone]

## 2023-03-27 ENCOUNTER — Telehealth: Payer: Self-pay | Admitting: *Deleted

## 2023-03-27 LAB — CULTURE, BLOOD (ROUTINE X 2)

## 2023-03-27 NOTE — Telephone Encounter (Signed)
Spoke with pt regarding an appointment. She stated she had been in the hospital for Sepsis secondary to UTI/right pyelonephritis, E. coli bacteremia, iron deficiency anemia, and vitamin D deficiency. She states she has been put on an antibiotic, which is on her chart as cipro 500mg  BID-interacts with warfarin and advised to take 1/2 tablet of warfarin tonight then resume normal dose. Also, she had been put on iron that has to have a prescription for it-niferex and vit d.   She has another appt in [place] and wants to save a trip therefore, Made her an appointment for Thursday 03/29/23 at 230pm. Reminded her to take only a 1/2 tablet of her warfarin tonight since the cipro can interact with the warfarin and she verbalized understanding.

## 2023-03-28 LAB — CULTURE, BLOOD (ROUTINE X 2): Culture: NO GROWTH

## 2023-03-29 ENCOUNTER — Ambulatory Visit (INDEPENDENT_AMBULATORY_CARE_PROVIDER_SITE_OTHER): Payer: Medicaid Other | Admitting: Family Medicine

## 2023-03-29 ENCOUNTER — Ambulatory Visit: Payer: Medicaid Other | Attending: Cardiology | Admitting: *Deleted

## 2023-03-29 ENCOUNTER — Encounter (HOSPITAL_BASED_OUTPATIENT_CLINIC_OR_DEPARTMENT_OTHER): Payer: Self-pay | Admitting: Family Medicine

## 2023-03-29 DIAGNOSIS — N12 Tubulo-interstitial nephritis, not specified as acute or chronic: Secondary | ICD-10-CM

## 2023-03-29 DIAGNOSIS — Z7901 Long term (current) use of anticoagulants: Secondary | ICD-10-CM

## 2023-03-29 DIAGNOSIS — I351 Nonrheumatic aortic (valve) insufficiency: Secondary | ICD-10-CM | POA: Diagnosis not present

## 2023-03-29 DIAGNOSIS — D509 Iron deficiency anemia, unspecified: Secondary | ICD-10-CM | POA: Diagnosis not present

## 2023-03-29 DIAGNOSIS — E559 Vitamin D deficiency, unspecified: Secondary | ICD-10-CM | POA: Insufficient documentation

## 2023-03-29 DIAGNOSIS — Z952 Presence of prosthetic heart valve: Secondary | ICD-10-CM

## 2023-03-29 LAB — BASIC METABOLIC PANEL
BUN/Creatinine Ratio: 25 — ABNORMAL HIGH (ref 9–23)
BUN: 26 mg/dL — ABNORMAL HIGH (ref 6–24)
CO2: 22 mmol/L (ref 20–29)
Calcium: 9.2 mg/dL (ref 8.7–10.2)
Chloride: 108 mmol/L — ABNORMAL HIGH (ref 96–106)
Creatinine, Ser: 1.03 mg/dL — ABNORMAL HIGH (ref 0.57–1.00)
Glucose: 88 mg/dL (ref 70–99)
Potassium: 5.1 mmol/L (ref 3.5–5.2)
Sodium: 143 mmol/L (ref 134–144)
eGFR: 65 mL/min/{1.73_m2} (ref >59)

## 2023-03-29 LAB — CBC WITH DIFFERENTIAL/PLATELET
Basophils Absolute: 0 10*3/uL (ref 0.0–0.2)
Basos: 0 %
EOS (ABSOLUTE): 0.1 10*3/uL (ref 0.0–0.4)
Eos: 1 %
Hematocrit: 37.9 % (ref 34.0–46.6)
Hemoglobin: 11.4 g/dL (ref 11.1–15.9)
Immature Grans (Abs): 0.1 10*3/uL (ref 0.0–0.1)
Immature Granulocytes: 1 %
Lymphocytes Absolute: 2.9 10*3/uL (ref 0.7–3.1)
Lymphs: 31 %
MCH: 23.1 pg — ABNORMAL LOW (ref 26.6–33.0)
MCHC: 30.1 g/dL — ABNORMAL LOW (ref 31.5–35.7)
MCV: 77 fL — ABNORMAL LOW (ref 79–97)
Monocytes Absolute: 0.7 10*3/uL (ref 0.1–0.9)
Monocytes: 8 %
Neutrophils Absolute: 5.5 10*3/uL (ref 1.4–7.0)
Neutrophils: 59 %
Platelets: 400 10*3/uL (ref 150–450)
RBC: 4.93 x10E6/uL (ref 3.77–5.28)
RDW: 16.4 % — ABNORMAL HIGH (ref 11.7–15.4)
WBC: 9.2 10*3/uL (ref 3.4–10.8)

## 2023-03-29 LAB — POCT INR: INR: 1.6 — AB (ref 2.0–3.0)

## 2023-03-29 NOTE — Patient Instructions (Addendum)
Description   Continue taking Warfarin 1 tablet (5mg ) daily except 1/2 tablet (2.5mg ) on Mondays.  Keep the number of servings of green leafy vegetables consistent in your diet.  Recheck in 3 weeks  Coumadin Clinic [phone] call with medication changes and or bleeding.

## 2023-03-29 NOTE — Assessment & Plan Note (Signed)
During recent hospitalization, patient diagnosed with iron deficiency anemia.  She was started on daily oral iron supplement.  She has been tolerating this, denies any significant GI symptoms. We did discuss management considerations, can transition to every other day dosing in order to reduce risk of GI side effects, improve tolerability of medication. We will plan to recheck CBC and iron storage labs in about 3 months, patient will complete these labs at time of next office visit

## 2023-03-29 NOTE — Patient Instructions (Signed)
  Medication Instructions:  Your physician recommends that you continue on your current medications as directed. Please refer to the Current Medication list given to you today. --If you need a refill on any your medications before your next appointment, please call your pharmacy first. If no refills are authorized on file call the office.-- Lab Work: Your physician has recommended that you have lab work today: Yes If you have labs (blood work) drawn today and your tests are completely normal, you will receive your results via MyChart message OR a phone call from our staff.  Please ensure you check your voicemail in the event that you authorized detailed messages to be left on a delegated number. If you have any lab test that is abnormal or we need to change your treatment, we will call you to review the results.  Referrals/Procedures/Imaging: No  Follow-Up: Your next appointment:   Your physician recommends that you schedule a follow-up appointment in 3 months with Dr. de [place].  You will receive a text message or e-mail with a link to a survey about your care and experience with us today! We would greatly appreciate your feedback!   Thanks for letting us be apart of your health journey!!  Primary Care and Sports Medicine   Dr. Raymond de [place]   We encourage you to activate your patient portal called "MyChart".  Sign up information is provided on this After Visit Summary.  MyChart is used to connect with patients for Virtual Visits (Telemedicine).  Patients are able to view lab/test results, encounter notes, upcoming appointments, etc.  Non-urgent messages can be sent to your provider as well. To learn more about what you can do with MyChart, please visit --  [email or web address].    

## 2023-03-29 NOTE — Progress Notes (Signed)
    Procedures performed today:    None.  Independent interpretation of notes and tests performed by another provider:   None.  Brief History, Exam, Impression, and Recommendations:    BP 127/75 (BP Location: Left Arm, Patient Position: Sitting, Cuff Size: Large)   Pulse 77   Ht 5\' 7"  (1.702 m)   Wt 292 lb (132.5 kg)   SpO2 96%   BMI 45.73 kg/m   Patient had recent hospitalization from from 6/20-6/22 related to pyelonephritis/sepsis.  Cultures ultimately grew E. coli and antibiotic therapy was stepdown from Rocephin to ciprofloxacin.  She was advised on taking 1 week course of ciprofloxacin on discharge, still has a couple days remaining of this.  She reports that she has generally been doing well, no current issues with fever, chills, sweats.  No back pain, no dysuria.  On exam, patient in no acute distress, vital signs stable.  Cardiovascular exam with regular rate and rhythm, lungs clear to auscultation bilaterally  Iron deficiency anemia, unspecified iron deficiency anemia type Assessment & Plan: During recent hospitalization, patient diagnosed with iron deficiency anemia.  She was started on daily oral iron supplement.  She has been tolerating this, denies any significant GI symptoms. We did discuss management considerations, can transition to every other day dosing in order to reduce risk of GI side effects, improve tolerability of medication. We will plan to recheck CBC and iron storage labs in about 3 months, patient will complete these labs at time of next office visit  Orders: -     CBC with Differential/Platelet -     Basic metabolic panel  Vitamin D deficiency Assessment & Plan: During recent hospitalization, patient found to have mild vitamin D deficiency.  She was started on vitamin D supplementation, has been tolerating without issue.  Currently administering 50,000 unit dose once weekly Patient will continue with current dosing and we will plan to recheck vitamin  D levels at time of next office visit.  Instructed on hide dose being taken for about 2 months and then transitioning to lower daily dose of 1000 units daily.   Pyelonephritis Assessment & Plan: As above, has been progressing well with antibiotic therapy, tolerating ciprofloxacin without issue. Recommend continuing with medication, no changes made today She is scheduled to follow-up with urology in a couple weeks, recommend continuing with his scheduled follow-up  Orders: -     CBC with Differential/Platelet -     Basic metabolic panel  Return in about 3 months (around 06/29/2023) for Vitamin D deficiency, iron deficiency.  Spent 38 minutes on this patient encounter, including preparation, chart review, face-to-face counseling with patient and coordination of care, and documentation of encounter   ___________________________________________ Shraga Custard de Peru, MD, ABFM, [facility], [facility]

## 2023-03-29 NOTE — Assessment & Plan Note (Signed)
During recent hospitalization, patient found to have mild vitamin D deficiency.  She was started on vitamin D supplementation, has been tolerating without issue.  Currently administering 50,000 unit dose once weekly Patient will continue with current dosing and we will plan to recheck vitamin D levels at time of next office visit.  Instructed on hide dose being taken for about 2 months and then transitioning to lower daily dose of 1000 units daily.

## 2023-03-29 NOTE — Assessment & Plan Note (Signed)
As above, has been progressing well with antibiotic therapy, tolerating ciprofloxacin without issue. Recommend continuing with medication, no changes made today She is scheduled to follow-up with urology in a couple weeks, recommend continuing with his scheduled follow-up

## 2023-04-11 ENCOUNTER — Ambulatory Visit (INDEPENDENT_AMBULATORY_CARE_PROVIDER_SITE_OTHER): Payer: Medicaid Other | Admitting: Urology

## 2023-04-11 ENCOUNTER — Encounter: Payer: Self-pay | Admitting: Urology

## 2023-04-11 VITALS — BP 134/87 | HR 74 | Ht 67.0 in | Wt 292.0 lb

## 2023-04-11 DIAGNOSIS — Z87440 Personal history of urinary (tract) infections: Secondary | ICD-10-CM

## 2023-04-11 DIAGNOSIS — N261 Atrophy of kidney (terminal): Secondary | ICD-10-CM | POA: Diagnosis not present

## 2023-04-11 DIAGNOSIS — N12 Tubulo-interstitial nephritis, not specified as acute or chronic: Secondary | ICD-10-CM

## 2023-04-11 LAB — MICROSCOPIC EXAMINATION
Epithelial Cells (non renal): >10 /hpf — AB (ref 0–10)
WBC, UA: >30 /hpf — AB (ref 0–5)

## 2023-04-11 LAB — URINALYSIS, COMPLETE
Bilirubin, UA: NEGATIVE
Glucose, UA: NEGATIVE
Ketones, UA: NEGATIVE
Nitrite, UA: NEGATIVE
RBC, UA: NEGATIVE
Specific Gravity, UA: 1.025 (ref 1.005–1.030)
Urobilinogen, Ur: 0.2 mg/dL (ref 0.2–1.0)
pH, UA: 6.5 (ref 5.0–7.5)

## 2023-04-11 NOTE — Progress Notes (Signed)
I, Duke Salvia, acting as a Neurosurgeon for Mary Altes, MD., have documented all relevant documentation on the behalf of Mary Altes, MD, as directed by  Mary Altes, MD while in the presence of Mary Altes, MD.   04/11/2023 1:06 PM   Mary Chambers Mary Chambers September 10, 1971 [id number]  Referring provider: de Peru, Raymond J, MD [address] [place],  [place] [address]  Chief Complaint  Patient presents with   New Patient (Initial Visit)    HPI: Mary Chambers is a 52 y.o. female referred for evaluation of left renal atrophy.  CT scan April 2024 remarkable for left renal atrophy with two punctate calculi, left upper pole. On record review, noted to have left renal atrophy dating back to a CT performed in 2000. At that time, she was noted to have left renal atrophy and had an obstructing right UVJ calculus and underwent ureteroscopy/stent placement. She was seen by [facility] in 2020. She had  a left ureteral stone in 2020 which was no longer present on follow up imaging. [facility] ED visit 03/21/2023 for pyelonephritis. CT showed questionable mass in the right kidney and MRI performed. Felt changes were consistent with pyelonephritis. Urine culture did grow E. coli and she is completing her antibiotic therapy and is feeling better.    PMH: Past Medical History:  Diagnosis Date   Abscess 06/23/2021   Achilles tendonitis 01/16/2014   Overview:   Followed by [employer].  Treated with brace and diclofenac.   Acute cystitis 09/12/2013   Last Assessment & Plan:   Formatting of this note might be different from the original.  Symptoms most consistent with acute cystitis     -- Lab testing: urine sent for culture and sensitivities  -- Antibiotic: proceed now with prescription for cipro 250 BID x 5 days (risks/benefits discussed).  -- Symptom relief: Use phenazopyridine (AZO) as needed.  Maintain hydration.  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  Follow-up with Dr. Jaynie Collins.  Watch for opportunities to increase physical activity within neuromuscular capabilities.   Chronic back pain 02/13/2018   Chronic pain syndrome 02/02/2016   Closed fracture of sacrum with routine healing 12/22/2021   COVID-19 virus infection 06/23/2021   Depression    Ear pain 03/07/2023   Herniated lumbar disc without myelopathy 10/02/2019   History of bacterial endocarditis 07/05/2022   Hypertension    Insomnia 12/08/2021   Kidney stone    Malodorous urine 05/13/2020   Last Assessment & Plan:   Formatting of this note might be different from the original.  Given no dysuria, urgency and a negative UA, this is unlikely a UTI. Patient advised to drink more water.   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   Transaminitis 06/23/2021   Urinary incontinence 06/03/2010   Vertebral osteomyelitis [facility])     Surgical History: Past Surgical History:  Procedure Laterality Date   ABDOMINAL HYSTERECTOMY     AORTIC VALVE REPLACEMENT N/A 11/07/2021   Procedure: AORTIC VALVE REPLACEMENT USING A On-X AORTIC VALVE.;  Surgeon: Corliss Skains, MD;  Location: [facility] OR;  Service: Open Heart Surgery;  Laterality: N/A;   BACK SURGERY     BUBBLE STUDY  08/31/2021   Procedure: BUBBLE STUDY;  Surgeon: Meriam Sprague, MD;  Location: [facility] ENDOSCOPY;  Service: Cardiovascular;;   CARPAL TUNNEL RELEASE     CHOLECYSTECTOMY     IR FLUORO GUIDED NEEDLE PLC ASPIRATION/INJECTION LOC  06/23/2021   LITHOTRIPSY     neck fusion     RIGHT/LEFT HEART CATH AND CORONARY ANGIOGRAPHY N/A 10/07/2021   Procedure: RIGHT/LEFT HEART  CATH AND CORONARY ANGIOGRAPHY;  Surgeon: Corky Crafts, MD;  Location: [facility] INVASIVE CV LAB;  Service: Cardiovascular;  Laterality: N/A;   TEE WITHOUT CARDIOVERSION N/A 06/28/2021   Procedure: TRANSESOPHAGEAL ECHOCARDIOGRAM (TEE);  Surgeon: Lewayne Bunting, MD;  Location: [facility] ENDOSCOPY;  Service: Cardiovascular;  Laterality: N/A;   TEE WITHOUT CARDIOVERSION N/A 08/31/2021   Procedure: TRANSESOPHAGEAL ECHOCARDIOGRAM (TEE);  Surgeon: Meriam Sprague, MD;  Location: [facility] ENDOSCOPY;  Service: Cardiovascular;  Laterality: N/A;   TEE WITHOUT CARDIOVERSION N/A 11/07/2021   Procedure: TRANSESOPHAGEAL ECHOCARDIOGRAM (TEE);  Surgeon: Corliss Skains, MD;  Location: [facility] OR;  Service: Open Heart Surgery;  Laterality: N/A;   TUBAL LIGATION      Home Medications:  Allergies as of 04/11/2023       Reactions   Baclofen Hives   Dilaudid [hydromorphone] Itching   Oxycodone Hcl Itching   Percocet [oxycodone-acetaminophen] Hives   Septra [sulfamethoxazole-trimethoprim] Hives   Vibramycin [doxycycline] Itching   Neurontin [gabapentin] Rash        Medication List        Accurate as of April 11, 2023  1:06 PM. If you have any questions, ask your nurse or doctor.          amLODipine 2.5 MG tablet Commonly known as: NORVASC TAKE 1 TABLET BY MOUTH EVERY DAY   aspirin EC 81 MG tablet Take 1 tablet (81 mg total) by mouth daily. Swallow whole.   atorvastatin 10 MG tablet Commonly known as: LIPITOR Take 1 tablet (10 mg total) by mouth every evening.   escitalopram 20 MG tablet Commonly known as: LEXAPRO TAKE 1 TABLET BY MOUTH EVERY DAY   fluticasone 50 MCG/ACT nasal spray Commonly known as: FLONASE Place 2 sprays into both nostrils daily.   furosemide 20 MG tablet Commonly known as: LASIX Take 1 tablet (20 mg total) by mouth daily.   iron polysaccharides 150 MG capsule Commonly known as: NIFEREX Take 1 capsule (150 mg total) by mouth daily.   loperamide 2 MG capsule Commonly  known as: IMODIUM Take 1 capsule (2 mg total) by mouth as needed for diarrhea or loose stools.   metoprolol tartrate 25 MG tablet Commonly known as: LOPRESSOR Take 0.5 tablets (12.5 mg total) by mouth 2 (two) times daily.   multivitamin with minerals Tabs tablet Take 1 tablet by mouth in the morning.   naproxen 250 MG tablet Commonly known as: NAPROSYN Take 1 tablet (250 mg total) by mouth 2 (two) times daily as needed.   pantoprazole 40 MG tablet Commonly known as: PROTONIX Take 1 tablet (40 mg total) by mouth daily.   POTASSIMIN PO Take 1 tablet by mouth daily as needed (with lasix).   tiZANidine 4 MG tablet Commonly known as: ZANAFLEX Take 1 tablet (4 mg total) by mouth at bedtime.   Vitamin C 500 MG Caps Take 500 mg by mouth in the morning.   Vitamin D (Ergocalciferol) 1.25 MG (50000  UNIT) Caps capsule Commonly known as: DRISDOL Take 1 capsule (50,000 Units total) by mouth every 7 (seven) days.   warfarin 5 MG tablet Commonly known as: COUMADIN Take as directed by the anticoagulation clinic. If you are unsure how to take this medication, talk to your nurse or doctor. Original instructions: TAKE 1 TO 1 & 1/2 TABLETS DAILY OR AS DIRECTED BY COUMADIN CLINIC        Allergies:  Allergies  Allergen Reactions   Baclofen Hives   Dilaudid [Hydromorphone] Itching   Oxycodone Hcl Itching   Percocet [Oxycodone-Acetaminophen] Hives   Septra [Sulfamethoxazole-Trimethoprim] Hives   Vibramycin [Doxycycline] Itching   Neurontin [Gabapentin] Rash    Family History: Family History  Problem Relation Age of Onset   Diabetes Mother    Hypertension Mother    Cancer Father    Parkinson's disease Father    Heart disease Father    Heart disease Sister    Heart disease Brother    Colon cancer Neg Hx    Rectal cancer Neg Hx    Stomach cancer Neg Hx     Social History:  reports that she has never smoked. She has never used smokeless tobacco. She reports that she does not  drink alcohol and does not use drugs.   Physical Exam: BP 134/87   Pulse 74   Ht 5\' 7"  (1.702 m)   Wt 292 lb (132.5 kg)   BMI 45.73 kg/m   Constitutional:  Alert and oriented, No acute distress. HEENT: [place] AT Respiratory: Normal respiratory effort, no increased work of breathing. Psychiatric: Normal mood and affect.  Laboratory Data:  Urinalysis 2+leukocytes Microscopy >30 WBC, however greater than 10 epis.    Assessment & Plan:    1. Left renal atrophy Longstanding and no additional evaluation needed.  Etiology undetermined  2. History of right pyelonephritis  Recent episode UTI/pyelonephritis. UA today with pyuria, however, specimen consistent with vaginal contamination. Follow up urine culture ordered.  I have reviewed the above documentation for accuracy and completeness, and I agree with the above.   Mary Altes, MD  [facility] [address] [place], [place] [address] [phone]

## 2023-04-14 ENCOUNTER — Telehealth: Payer: 59 | Admitting: Family Medicine

## 2023-04-14 DIAGNOSIS — R399 Unspecified symptoms and signs involving the genitourinary system: Secondary | ICD-10-CM | POA: Diagnosis not present

## 2023-04-14 MED ORDER — CEPHALEXIN 500 MG PO CAPS: 500.0000 mg | ORAL_CAPSULE | Freq: Two times a day (BID) | ORAL | 0 refills | Status: AC

## 2023-04-14 NOTE — Progress Notes (Signed)

## 2023-04-18 ENCOUNTER — Ambulatory Visit: Payer: Medicaid Other | Attending: Cardiology

## 2023-04-18 DIAGNOSIS — Z7901 Long term (current) use of anticoagulants: Secondary | ICD-10-CM | POA: Diagnosis not present

## 2023-04-18 DIAGNOSIS — I351 Nonrheumatic aortic (valve) insufficiency: Secondary | ICD-10-CM

## 2023-04-18 DIAGNOSIS — Z952 Presence of prosthetic heart valve: Secondary | ICD-10-CM

## 2023-04-18 LAB — POCT INR: INR: 1.5 — AB (ref 2.0–3.0)

## 2023-04-18 NOTE — Patient Instructions (Signed)
Description   Continue taking Warfarin 1 tablet (5mg) daily except 1/2 tablet (2.5mg) on Mondays.  Keep the number of servings of green leafy vegetables consistent in your diet.  Recheck in 4 weeks.  Coumadin Clinic [phone] call with medication changes and or bleeding.      

## 2023-04-19 LAB — CULTURE, URINE COMPREHENSIVE

## 2023-04-23 ENCOUNTER — Other Ambulatory Visit: Payer: Self-pay | Admitting: *Deleted

## 2023-04-23 MED ORDER — CEFUROXIME AXETIL 500 MG PO TABS: 500.0000 mg | ORAL_TABLET | Freq: Two times a day (BID) | ORAL | 0 refills | Status: AC

## 2023-04-24 ENCOUNTER — Telehealth: Payer: Self-pay

## 2023-04-24 NOTE — Telephone Encounter (Signed)
I tried calling patient to move up INR appt, because she is starting Ceftin 500 mg bid today for 7 days.  I left a message and informed her to eat greens every other day while taking medication.

## 2023-04-26 ENCOUNTER — Telehealth (HOSPITAL_BASED_OUTPATIENT_CLINIC_OR_DEPARTMENT_OTHER): Payer: Self-pay | Admitting: Cardiology

## 2023-04-26 NOTE — Telephone Encounter (Signed)
Spoke with patient regarding schedule change for Dr. Burnell Blanks 06/29/23 11:00 am appointment rescheduled to Thursday 06/28/23 at 11:00 am.  Will mail information to patient and she voiced her understanding

## 2023-04-26 NOTE — Telephone Encounter (Signed)
Received call from pt stating she was told she needed to come tomorrow or Monday to have INR checked since she was started on Ceftin 500mg  bid. I made pt aware she does not need INR checked sooner because this medication should not affect her INR. Pt verbalized understanding and was very appreciative for the call back.

## 2023-05-03 ENCOUNTER — Other Ambulatory Visit: Payer: Self-pay

## 2023-05-03 ENCOUNTER — Other Ambulatory Visit: Payer: 59

## 2023-05-03 DIAGNOSIS — N261 Atrophy of kidney (terminal): Secondary | ICD-10-CM

## 2023-05-03 LAB — MICROSCOPIC EXAMINATION

## 2023-05-03 LAB — URINALYSIS, COMPLETE
Bilirubin, UA: NEGATIVE
Glucose, UA: NEGATIVE
Ketones, UA: NEGATIVE
Leukocytes,UA: NEGATIVE
Nitrite, UA: NEGATIVE
RBC, UA: NEGATIVE
Specific Gravity, UA: 1.025 (ref 1.005–1.030)
Urobilinogen, Ur: 0.2 mg/dL (ref 0.2–1.0)
pH, UA: 6 (ref 5.0–7.5)

## 2023-05-09 ENCOUNTER — Ambulatory Visit: Payer: 59 | Admitting: Podiatry

## 2023-05-16 ENCOUNTER — Ambulatory Visit: Payer: 59 | Attending: Cardiovascular Disease

## 2023-05-16 DIAGNOSIS — Z7901 Long term (current) use of anticoagulants: Secondary | ICD-10-CM | POA: Diagnosis not present

## 2023-05-16 DIAGNOSIS — Z952 Presence of prosthetic heart valve: Secondary | ICD-10-CM

## 2023-05-16 DIAGNOSIS — I351 Nonrheumatic aortic (valve) insufficiency: Secondary | ICD-10-CM | POA: Diagnosis not present

## 2023-05-16 LAB — POCT INR: INR: 2.1 (ref 2.0–3.0)

## 2023-05-16 NOTE — Patient Instructions (Signed)
Description   Take 1/2 tablet today, then resume same dosage of Warfarin 1 tablet (5mg ) daily except 1/2 tablet (2.5mg ) on Mondays.  Keep the number of servings of green leafy vegetables consistent in your diet.  Recheck in 4 weeks  Coumadin Clinic [phone] call with medication changes and or bleeding.

## 2023-05-19 ENCOUNTER — Telehealth: Payer: 59 | Admitting: Nurse Practitioner

## 2023-05-19 DIAGNOSIS — M5441 Lumbago with sciatica, right side: Secondary | ICD-10-CM

## 2023-05-19 MED ORDER — CYCLOBENZAPRINE HCL 10 MG PO TABS: 10.0000 mg | ORAL_TABLET | Freq: Three times a day (TID) | ORAL | 0 refills | Status: DC | PRN

## 2023-05-19 MED ORDER — NAPROXEN 500 MG PO TABS: 500.0000 mg | ORAL_TABLET | Freq: Two times a day (BID) | ORAL | 0 refills | Status: DC

## 2023-05-19 NOTE — Progress Notes (Signed)

## 2023-05-21 ENCOUNTER — Other Ambulatory Visit (HOSPITAL_BASED_OUTPATIENT_CLINIC_OR_DEPARTMENT_OTHER): Payer: Self-pay | Admitting: Cardiology

## 2023-05-21 DIAGNOSIS — R002 Palpitations: Secondary | ICD-10-CM

## 2023-05-22 ENCOUNTER — Encounter (HOSPITAL_BASED_OUTPATIENT_CLINIC_OR_DEPARTMENT_OTHER): Payer: Self-pay

## 2023-05-22 DIAGNOSIS — R002 Palpitations: Secondary | ICD-10-CM

## 2023-05-22 MED ORDER — AMLODIPINE BESYLATE 2.5 MG PO TABS
2.5000 mg | ORAL_TABLET | Freq: Every day | ORAL | 3 refills | Status: DC
Start: 2023-05-22 — End: 2024-04-17

## 2023-05-29 ENCOUNTER — Telehealth: Payer: Self-pay

## 2023-05-29 ENCOUNTER — Telehealth: Payer: 59 | Admitting: Physician Assistant

## 2023-05-29 DIAGNOSIS — R3989 Other symptoms and signs involving the genitourinary system: Secondary | ICD-10-CM

## 2023-05-29 MED ORDER — CEPHALEXIN 500 MG PO CAPS: 500.0000 mg | ORAL_CAPSULE | Freq: Two times a day (BID) | ORAL | 0 refills | Status: DC

## 2023-05-29 NOTE — Telephone Encounter (Signed)
Pt calls triage line and states that she is returning a call from our office, I do not see any telephone encounters documented for today. She states that she sent a mychart message requesting an appointment with our office and thinks the call may have been in reference to that. She states she did complete an e-vist with her PCP today but would prefer to see our office. Advised pt I would send message to [facility] CMA for clarification.

## 2023-05-29 NOTE — Progress Notes (Signed)
I have spent 5 minutes in review of e-visit questionnaire, review and updating patient chart, medical decision making and response to patient.   William Cody Martin, PA-C    

## 2023-05-29 NOTE — Progress Notes (Signed)
E-Visit for Urinary Problems  We are sorry that you are not feeling well.  Here is how we plan to help!  Based on what you shared with me it looks like you most likely have a simple urinary tract infection.  A UTI (Urinary Tract Infection) is a bacterial infection of the bladder.  Most cases of urinary tract infections are simple to treat but a key part of your care is to encourage you to drink plenty of fluids and watch your symptoms carefully.  I have prescribed Keflex 500 mg twice a day for 7 days.  Your symptoms should gradually improve. Call us if the burning in your urine worsens, you develop worsening fever, back pain or pelvic pain or if your symptoms do not resolve after completing the antibiotic.  Urinary tract infections can be prevented by drinking plenty of water to keep your body hydrated.  Also be sure when you wipe, wipe from front to back and don't hold it in!  If possible, empty your bladder every 4 hours.  HOME CARE Drink plenty of fluids Compete the full course of the antibiotics even if the symptoms resolve Remember, when you need to go.go. Holding in your urine can increase the likelihood of getting a UTI! GET HELP RIGHT AWAY IF: You cannot urinate You get a high fever Worsening back pain occurs You see blood in your urine You feel sick to your stomach or throw up You feel like you are going to pass out  MAKE SURE YOU  Understand these instructions. Will watch your condition. Will get help right away if you are not doing well or get worse.   Thank you for choosing an e-visit.  Your e-visit answers were reviewed by a board certified advanced clinical practitioner to complete your personal care plan. Depending upon the condition, your plan could have included both over the counter or prescription medications.  Please review your pharmacy choice. Make sure the pharmacy is open so you can pick up prescription now. If there is a problem, you may contact your  provider through MyChart messaging and have the prescription routed to another pharmacy.  Your safety is important to us. If you have drug allergies check your prescription carefully.   For the next 24 hours you can use MyChart to ask questions about today's visit, request a non-urgent call back, or ask for a work or school excuse. You will get an email in the next two days asking about your experience. I hope that your e-visit has been valuable and will speed your recovery.  

## 2023-05-29 NOTE — Telephone Encounter (Signed)
Pt was rescheduled to an earlier date due to UTI symptoms.

## 2023-05-31 ENCOUNTER — Ambulatory Visit (INDEPENDENT_AMBULATORY_CARE_PROVIDER_SITE_OTHER): Payer: 59 | Admitting: Physician Assistant

## 2023-05-31 ENCOUNTER — Telehealth: Payer: Self-pay | Admitting: *Deleted

## 2023-05-31 DIAGNOSIS — R350 Frequency of micturition: Secondary | ICD-10-CM | POA: Diagnosis not present

## 2023-05-31 DIAGNOSIS — M545 Low back pain, unspecified: Secondary | ICD-10-CM

## 2023-05-31 DIAGNOSIS — N390 Urinary tract infection, site not specified: Secondary | ICD-10-CM

## 2023-05-31 DIAGNOSIS — R61 Generalized hyperhidrosis: Secondary | ICD-10-CM | POA: Diagnosis not present

## 2023-05-31 MED ORDER — CEFUROXIME AXETIL 250 MG PO TABS
250.0000 mg | ORAL_TABLET | Freq: Two times a day (BID) | ORAL | 0 refills | Status: AC
Start: 2023-05-31 — End: 2023-06-07

## 2023-05-31 NOTE — Progress Notes (Signed)
05/31/2023 2:19 PM   [name] [name] Dec 15, 1970 [id number]  CC: Chief Complaint  Patient presents with   Follow-up   HPI: Mary Chambers is a 52 y.o. female with PMH nephrolithiasis, pyelonephritis, and left renal atrophy who presents today for evaluation of possible UTI.   Today she reports several days of urinary frequency, night sweats, nausea that has since resolved, and right low back pain.  She had a virtual visit 2 days ago and was prescribed Keflex.  She took 1 dose but then discontinued the antibiotic because she was scheduled to see [place].  She denies dysuria, fever, and vomiting.  CT stone study dated 01/17/2023 was notable for punctate left upper pole stones with no right nephrolithiasis.  In-office UA today positive for 1+ protein and trace leukocytes; urine microscopy with 11-30 WBCs/HPF, >10 epithelial cells/hpf, and moderate bacteria.  PMH: Past Medical History:  Diagnosis Date   Abscess 06/23/2021   Achilles tendonitis 01/16/2014   Overview:   Followed by [employer].  Treated with brace and diclofenac.   Acute cystitis 09/12/2013   Last Assessment & Plan:   Formatting of this note might be different from the original.  Symptoms most consistent with acute cystitis     -- Lab testing: urine sent for culture and sensitivities  -- Antibiotic: proceed now with prescription for cipro 250 BID x 5 days (risks/benefits discussed).  -- Symptom relief: Use phenazopyridine (AZO) as needed.  Maintain hydration.  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  Follow-up with Dr. Jaynie Collins.  Watch for opportunities to  increase physical activity within neuromuscular capabilities.   Chronic back pain 02/13/2018   Chronic pain syndrome 02/02/2016   Closed fracture of sacrum with routine healing 12/22/2021   COVID-19 virus infection 06/23/2021   Depression    Ear pain 03/07/2023   Herniated lumbar disc without myelopathy 10/02/2019   History of bacterial endocarditis 07/05/2022   Hypertension    Insomnia 12/08/2021   Kidney stone    Malodorous urine 05/13/2020   Last Assessment & Plan:   Formatting of this note might be different from the original.  Given no dysuria, urgency and a negative UA, this is unlikely a UTI. Patient advised to drink more water.   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   Transaminitis 06/23/2021   Urinary incontinence 06/03/2010   Vertebral osteomyelitis (HCC)     Surgical History: Past Surgical History:  Procedure Laterality Date   ABDOMINAL HYSTERECTOMY     AORTIC VALVE REPLACEMENT N/A 11/07/2021   Procedure: AORTIC VALVE REPLACEMENT USING A On-X AORTIC VALVE.;  Surgeon: Corliss Skains, MD;  Location: [facility] OR;  Service: Open Heart Surgery;  Laterality: N/A;   BACK SURGERY     BUBBLE STUDY  08/31/2021   Procedure: BUBBLE STUDY;  Surgeon: Meriam Sprague, MD;  Location: [facility] ENDOSCOPY;  Service: Cardiovascular;;   CARPAL TUNNEL RELEASE     CHOLECYSTECTOMY     IR FLUORO GUIDED NEEDLE PLC ASPIRATION/INJECTION LOC  06/23/2021   LITHOTRIPSY     neck  fusion     RIGHT/LEFT HEART CATH AND CORONARY ANGIOGRAPHY N/A 10/07/2021   Procedure: RIGHT/LEFT HEART CATH AND CORONARY ANGIOGRAPHY;  Surgeon: Corky Crafts, MD;  Location: [facility] INVASIVE CV LAB;  Service: Cardiovascular;  Laterality: N/A;   TEE WITHOUT CARDIOVERSION N/A 06/28/2021   Procedure: TRANSESOPHAGEAL ECHOCARDIOGRAM (TEE);  Surgeon: Lewayne Bunting, MD;  Location: [facility] ENDOSCOPY;  Service: Cardiovascular;  Laterality: N/A;   TEE WITHOUT CARDIOVERSION N/A 08/31/2021   Procedure:  TRANSESOPHAGEAL ECHOCARDIOGRAM (TEE);  Surgeon: Meriam Sprague, MD;  Location: [facility] ENDOSCOPY;  Service: Cardiovascular;  Laterality: N/A;   TEE WITHOUT CARDIOVERSION N/A 11/07/2021   Procedure: TRANSESOPHAGEAL ECHOCARDIOGRAM (TEE);  Surgeon: Corliss Skains, MD;  Location: [facility] OR;  Service: Open Heart Surgery;  Laterality: N/A;   TUBAL LIGATION      Home Medications:  Allergies as of 05/31/2023       Reactions   Baclofen Hives   Dilaudid [hydromorphone] Itching   Oxycodone Hcl Itching   Percocet [oxycodone-acetaminophen] Hives   Septra [sulfamethoxazole-trimethoprim] Hives   Vibramycin [doxycycline] Itching   Neurontin [gabapentin] Rash        Medication List        Accurate as of May 31, 2023  2:19 PM. If you have any questions, ask your nurse or doctor.          STOP taking these medications    cephALEXin 500 MG capsule Commonly known as: KEFLEX Stopped by: Carman Ching       TAKE these medications    amLODipine 2.5 MG tablet Commonly known as: NORVASC Take 1 tablet (2.5 mg total) by mouth daily.   aspirin EC 81 MG tablet Take 1 tablet (81 mg total) by mouth daily. Swallow whole.   atorvastatin 10 MG tablet Commonly known as: LIPITOR Take 1 tablet (10 mg total) by mouth every evening.   cefUROXime 250 MG tablet Commonly known as: CEFTIN Take 1 tablet (250 mg total) by mouth 2 (two) times daily with a meal for 7 days. Started by: Carman Ching   cyclobenzaprine 10 MG tablet Commonly known as: FLEXERIL Take 1 tablet (10 mg total) by mouth 3 (three) times daily as needed for muscle spasms.   escitalopram 20 MG tablet Commonly known as: LEXAPRO TAKE 1 TABLET BY MOUTH EVERY DAY   fluticasone 50 MCG/ACT nasal spray Commonly known as: FLONASE Place 2 sprays into both nostrils daily.   furosemide 20 MG tablet Commonly known as: LASIX Take 1 tablet (20 mg total) by mouth daily.   iron polysaccharides 150 MG capsule Commonly  known as: NIFEREX Take 1 capsule (150 mg total) by mouth daily.   loperamide 2 MG capsule Commonly known as: IMODIUM Take 1 capsule (2 mg total) by mouth as needed for diarrhea or loose stools.   metoprolol tartrate 25 MG tablet Commonly known as: LOPRESSOR Take 0.5 tablets (12.5 mg total) by mouth 2 (two) times daily.   multivitamin with minerals Tabs tablet Take 1 tablet by mouth in the morning.   naproxen 500 MG tablet Commonly known as: Naprosyn Take 1 tablet (500 mg total) by mouth 2 (two) times daily with a meal.   pantoprazole 40 MG tablet Commonly known as: PROTONIX Take 1 tablet (40 mg total) by mouth daily.   POTASSIMIN PO Take 1 tablet by mouth daily as needed (with lasix).   tiZANidine 4 MG tablet Commonly known as: ZANAFLEX Take 1 tablet (4 mg total) by mouth at bedtime.   Vitamin C 500 MG Caps Take  500 mg by mouth in the morning.   Vitamin D (Ergocalciferol) 1.25 MG (50000 UNIT) Caps capsule Commonly known as: DRISDOL Take 1 capsule (50,000 Units total) by mouth every 7 (seven) days.   warfarin 5 MG tablet Commonly known as: COUMADIN Take as directed by the anticoagulation clinic. If you are unsure how to take this medication, talk to your nurse or doctor. Original instructions: TAKE 1 TO 1 & 1/2 TABLETS DAILY OR AS DIRECTED BY COUMADIN CLINIC        Allergies:  Allergies  Allergen Reactions   Baclofen Hives   Dilaudid [Hydromorphone] Itching   Oxycodone Hcl Itching   Percocet [Oxycodone-Acetaminophen] Hives   Septra [Sulfamethoxazole-Trimethoprim] Hives   Vibramycin [Doxycycline] Itching   Neurontin [Gabapentin] Rash    Family History: Family History  Problem Relation Age of Onset   Diabetes Mother    Hypertension Mother    Cancer Father    Parkinson's disease Father    Heart disease Father    Heart disease Sister    Heart disease Brother    Colon cancer Neg Hx    Rectal cancer Neg Hx    Stomach cancer Neg Hx     Social History:    reports that she has never smoked. She has never used smokeless tobacco. She reports that she does not drink alcohol and does not use drugs.  Physical Exam: There were no vitals taken for this visit.  Constitutional:  Alert and oriented, no acute distress, nontoxic appearing HEENT: [place], AT Cardiovascular: No clubbing, cyanosis, or edema Respiratory: Normal respiratory effort, no increased work of breathing Skin: No rashes, bruises or suspicious lesions Neurologic: Grossly intact, no focal deficits, moving all 4 extremities Psychiatric: Normal mood and affect  Laboratory Data: See Epic  Assessment & Plan:   1. Recurrent UTI UA appears infected, though contaminated.  Will start empiric cefuroxime and send for culture for further evaluation, though this may come back negative since she took a dose of Keflex earlier this week.  Notably, she is allergic to Bactrim and there are too many medication interactions for me to use a fluoroquinolone today.  I am extending her cefuroxime course to 7 days given reports of right low back pain, though low suspicion for pyelonephritis in the absence of fevers or systemic illness.  With no stone seen on the right side on recent CT stone study, I do not think additional imaging is warranted at this time.  We discussed return precautions including new fevers.  She expressed understanding. - Urinalysis, Complete - CULTURE, URINE COMPREHENSIVE - cefUROXime (CEFTIN) 250 MG tablet; Take 1 tablet (250 mg total) by mouth 2 (two) times daily with a meal for 7 days.  Dispense: 14 tablet; Refill: 0   Return if symptoms worsen or fail to improve.  Carman Ching, PA-C  [facility] [place] [address] [place], [place] [address] [phone]

## 2023-05-31 NOTE — Telephone Encounter (Signed)
Called pt since received a message that she started a new medication. There was no answer but left a message advising her that the ceftin 250mg  BID x 7 days will be okay with warfarin. Advised to call back with any other issues or new meds. Added med on upcoming 9/11 appt

## 2023-06-01 LAB — URINALYSIS, COMPLETE
Bilirubin, UA: NEGATIVE
Glucose, UA: NEGATIVE
Ketones, UA: NEGATIVE
Nitrite, UA: NEGATIVE
RBC, UA: NEGATIVE
Specific Gravity, UA: >1.03 — ABNORMAL HIGH (ref 1.005–1.030)
Urobilinogen, Ur: 0.2 mg/dL (ref 0.2–1.0)
pH, UA: 6 (ref 5.0–7.5)

## 2023-06-01 LAB — MICROSCOPIC EXAMINATION: Epithelial Cells (non renal): >10 /hpf — AB (ref 0–10)

## 2023-06-03 LAB — CULTURE, URINE COMPREHENSIVE

## 2023-06-06 ENCOUNTER — Ambulatory Visit: Payer: 59 | Admitting: Physician Assistant

## 2023-06-08 ENCOUNTER — Telehealth: Payer: Self-pay

## 2023-06-08 NOTE — Telephone Encounter (Signed)
LVM for patient to return call, to schedule  Office visit with CATH UA

## 2023-06-09 ENCOUNTER — Telehealth: Payer: 59 | Admitting: Family

## 2023-06-09 DIAGNOSIS — M5441 Lumbago with sciatica, right side: Secondary | ICD-10-CM

## 2023-06-09 MED ORDER — NAPROXEN 500 MG PO TABS
500.0000 mg | ORAL_TABLET | Freq: Two times a day (BID) | ORAL | 0 refills | Status: DC
Start: 2023-06-09 — End: 2023-10-26

## 2023-06-09 MED ORDER — CYCLOBENZAPRINE HCL 10 MG PO TABS: 10.0000 mg | ORAL_TABLET | Freq: Three times a day (TID) | ORAL | 0 refills | Status: DC | PRN

## 2023-06-09 NOTE — Progress Notes (Signed)

## 2023-06-10 ENCOUNTER — Other Ambulatory Visit: Payer: Self-pay | Admitting: Nurse Practitioner

## 2023-06-10 DIAGNOSIS — M5441 Lumbago with sciatica, right side: Secondary | ICD-10-CM

## 2023-06-12 ENCOUNTER — Ambulatory Visit (INDEPENDENT_AMBULATORY_CARE_PROVIDER_SITE_OTHER): Payer: 59 | Admitting: Physician Assistant

## 2023-06-12 ENCOUNTER — Other Ambulatory Visit: Payer: Self-pay | Admitting: Family

## 2023-06-12 DIAGNOSIS — R58 Hemorrhage, not elsewhere classified: Secondary | ICD-10-CM | POA: Diagnosis not present

## 2023-06-12 DIAGNOSIS — M5441 Lumbago with sciatica, right side: Secondary | ICD-10-CM

## 2023-06-12 LAB — URINALYSIS, COMPLETE
Bilirubin, UA: NEGATIVE
Glucose, UA: NEGATIVE
Ketones, UA: NEGATIVE
Leukocytes,UA: NEGATIVE
Nitrite, UA: NEGATIVE
RBC, UA: NEGATIVE
Specific Gravity, UA: >1.03 — ABNORMAL HIGH (ref 1.005–1.030)
Urobilinogen, Ur: 0.2 mg/dL (ref 0.2–1.0)
pH, UA: 6 (ref 5.0–7.5)

## 2023-06-12 LAB — MICROSCOPIC EXAMINATION: Epithelial Cells (non renal): >10 /HPF — AB (ref 0–10)

## 2023-06-12 NOTE — Progress Notes (Signed)
06/12/2023 11:54 AM   Mary Chambers Mary Chambers 10-01-1971 [id number]  CC: Chief Complaint  Patient presents with   Follow-up    Cath UA   HPI: Mary Chambers is a 52 y.o. female with PMH nephrolithiasis, pyelonephritis, and left renal atrophy who presents today for evaluation of blood on her toilet paper.   I saw her in clinic most recently on 05/31/2023 for possible UTI.  She describes frequency, night sweats, nausea, and right low back pain.  UA appeared contaminated, but there was pyuria and bacteriuria.  A recent CT stone study had shown no right-sided nephrolithiasis, so I felt the stone episode was unlikely.  She does have some known punctate left upper pole stones.  Her urine culture grew mixed urogenital flora, however she had started taking Keflex prior to her visit.  I switched her to cefuroxime when she saw me.  She subsequently reached out via MyChart to report blood in the toilet and on her toilet paper when she wipes.  She is here today for cath UA to try to determine the etiology of this bleeding.  She has noticed blood on the toilet paper this morning.  Today she reports her urinary symptoms completely resolved after recent course of antibiotics.  She is having no pain today.  She is postmenopausal and underwent hysterectomy in 2002.  She has not noticed any wounds, cysts, or irritations around her vulva or anus.  The blood on her toilet paper started after her most recent clinic visit.  In-office catheterized UA today positive for 2+ protein; urine microscopy with >10 epithelial cells/hpf, granular casts, calcium oxalate crystals, and moderate bacteria.   PMH: Past Medical History:  Diagnosis Date   Abscess 06/23/2021   Achilles tendonitis 01/16/2014   Overview:   Followed by [employer].  Treated with brace and diclofenac.   Acute cystitis 09/12/2013   Last Assessment & Plan:   Formatting of this note might be different from the original.  Symptoms most consistent  with acute cystitis     -- Lab testing: urine sent for culture and sensitivities  -- Antibiotic: proceed now with prescription for cipro 250 BID x 5 days (risks/benefits discussed).  -- Symptom relief: Use phenazopyridine (AZO) as needed.  Maintain hydration.  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  Follow-up with Dr. Jaynie Collins.  Watch for opportunities to increase physical activity within neuromuscular capabilities.   Chronic back pain 02/13/2018   Chronic pain syndrome 02/02/2016   Closed fracture of sacrum with routine healing 12/22/2021   COVID-19 virus infection 06/23/2021   Depression    Ear pain 03/07/2023   Herniated lumbar disc without myelopathy 10/02/2019   History of bacterial endocarditis 07/05/2022   Hypertension    Insomnia 12/08/2021   Kidney stone    Malodorous urine 05/13/2020   Last Assessment & Plan:   Formatting of this note might be different from the original.  Given no dysuria, urgency and a negative UA, this is unlikely a UTI. Patient advised to drink more water.   Obesity    Sleep apnea 06/25/2018  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   Transaminitis 06/23/2021   Urinary incontinence 06/03/2010   Vertebral osteomyelitis (HCC)     Surgical History: Past Surgical History:  Procedure Laterality Date   ABDOMINAL HYSTERECTOMY     AORTIC VALVE REPLACEMENT N/A 11/07/2021   Procedure: AORTIC VALVE REPLACEMENT USING A On-X AORTIC VALVE.;  Surgeon: Corliss Skains, MD;  Location: [facility] OR;  Service: Open Heart Surgery;  Laterality: N/A;   BACK SURGERY     BUBBLE STUDY  08/31/2021   Procedure: BUBBLE STUDY;   Surgeon: Meriam Sprague, MD;  Location: [facility] ENDOSCOPY;  Service: Cardiovascular;;   CARPAL TUNNEL RELEASE     CHOLECYSTECTOMY     IR FLUORO GUIDED NEEDLE PLC ASPIRATION/INJECTION LOC  06/23/2021   LITHOTRIPSY     neck fusion     RIGHT/LEFT HEART CATH AND CORONARY ANGIOGRAPHY N/A 10/07/2021   Procedure: RIGHT/LEFT HEART CATH AND CORONARY ANGIOGRAPHY;  Surgeon: Corky Crafts, MD;  Location: [facility] INVASIVE CV LAB;  Service: Cardiovascular;  Laterality: N/A;   TEE WITHOUT CARDIOVERSION N/A 06/28/2021   Procedure: TRANSESOPHAGEAL ECHOCARDIOGRAM (TEE);  Surgeon: Lewayne Bunting, MD;  Location: [facility] ENDOSCOPY;  Service: Cardiovascular;  Laterality: N/A;   TEE WITHOUT CARDIOVERSION N/A 08/31/2021   Procedure: TRANSESOPHAGEAL ECHOCARDIOGRAM (TEE);  Surgeon: Meriam Sprague, MD;  Location: [facility] ENDOSCOPY;  Service: Cardiovascular;  Laterality: N/A;   TEE WITHOUT CARDIOVERSION N/A 11/07/2021   Procedure: TRANSESOPHAGEAL ECHOCARDIOGRAM (TEE);  Surgeon: Corliss Skains, MD;  Location: [facility] OR;  Service: Open Heart Surgery;  Laterality: N/A;   TUBAL LIGATION      Home Medications:  Allergies as of 06/12/2023       Reactions   Baclofen Hives   Dilaudid [hydromorphone] Itching   Oxycodone Hcl Itching   Percocet [oxycodone-acetaminophen] Hives   Septra [sulfamethoxazole-trimethoprim] Hives   Vibramycin [doxycycline] Itching   Neurontin [gabapentin] Rash        Medication List        Accurate as of June 12, 2023 11:54 AM. If you have any questions, ask your nurse or doctor.          amLODipine 2.5 MG tablet Commonly known as: NORVASC Take 1 tablet (2.5 mg total) by mouth daily.   aspirin EC 81 MG tablet Take 1 tablet (81 mg total) by mouth daily. Swallow whole.   atorvastatin 10 MG tablet Commonly known as: LIPITOR Take 1 tablet (10 mg total) by mouth every evening.   cyclobenzaprine 10 MG tablet Commonly known as: FLEXERIL Take 1 tablet (10 mg total) by mouth 3  (three) times daily as needed for muscle spasms.   escitalopram 20 MG tablet Commonly known as: LEXAPRO TAKE 1 TABLET BY MOUTH EVERY DAY   fluticasone 50 MCG/ACT nasal spray Commonly known as: FLONASE Place 2 sprays into both nostrils daily.   furosemide 20 MG tablet Commonly known as: LASIX Take 1 tablet (20 mg total) by mouth daily.   iron polysaccharides 150 MG capsule Commonly known as: NIFEREX Take 1 capsule (150 mg total) by mouth daily.   loperamide 2 MG capsule Commonly known as: IMODIUM Take 1 capsule (2 mg total) by mouth as needed for diarrhea or loose stools.   metoprolol tartrate 25 MG tablet Commonly known as: LOPRESSOR Take 0.5 tablets (12.5 mg total) by mouth 2 (two) times daily.   multivitamin with minerals Tabs tablet Take 1 tablet by mouth in the morning.   naproxen 500 MG tablet Commonly known as: Naprosyn Take 1 tablet (500 mg total)  by mouth 2 (two) times daily with a meal.   pantoprazole 40 MG tablet Commonly known as: PROTONIX Take 1 tablet (40 mg total) by mouth daily.   POTASSIMIN PO Take 1 tablet by mouth daily as needed (with lasix).   tiZANidine 4 MG tablet Commonly known as: ZANAFLEX Take 1 tablet (4 mg total) by mouth at bedtime.   Vitamin C 500 MG Caps Take 500 mg by mouth in the morning.   Vitamin D (Ergocalciferol) 1.25 MG (50000 UNIT) Caps capsule Commonly known as: DRISDOL Take 1 capsule (50,000 Units total) by mouth every 7 (seven) days.   warfarin 5 MG tablet Commonly known as: COUMADIN Take as directed by the anticoagulation clinic. If you are unsure how to take this medication, talk to your nurse or doctor. Original instructions: TAKE 1 TO 1 & 1/2 TABLETS DAILY OR AS DIRECTED BY COUMADIN CLINIC        Allergies:  Allergies  Allergen Reactions   Baclofen Hives   Dilaudid [Hydromorphone] Itching   Oxycodone Hcl Itching   Percocet [Oxycodone-Acetaminophen] Hives   Septra [Sulfamethoxazole-Trimethoprim] Hives    Vibramycin [Doxycycline] Itching   Neurontin [Gabapentin] Rash    Family History: Family History  Problem Relation Age of Onset   Diabetes Mother    Hypertension Mother    Cancer Father    Parkinson's disease Father    Heart disease Father    Heart disease Sister    Heart disease Brother    Colon cancer Neg Hx    Rectal cancer Neg Hx    Stomach cancer Neg Hx     Social History:   reports that she has never smoked. She has never used smokeless tobacco. She reports that she does not drink alcohol and does not use drugs.  Physical Exam: There were no vitals taken for this visit.  Constitutional:  Alert and oriented, no acute distress, nontoxic appearing HEENT: [place], AT Cardiovascular: No clubbing, cyanosis, or edema Respiratory: Normal respiratory effort, no increased work of breathing Skin: No rashes, bruises or suspicious lesions Neurologic: Grossly intact, no focal deficits, moving all 4 extremities Psychiatric: Normal mood and affect  Laboratory Data: Results for orders placed or performed in visit on 06/12/23  Microscopic Examination   Urine  Result Value Ref Range   WBC, UA 0-5 0 - 5 /hpf   RBC, Urine 0-2 0 - 2 /hpf   Epithelial Cells (non renal) >10 (A) 0 - 10 /hpf   Casts Present (A) None seen /lpf   Cast Type Granular casts (A) N/A   Crystals Present (A) N/A   Crystal Type Calcium Oxalate N/A   Mucus, UA Present (A) Not Estab.   Bacteria, UA Moderate (A) None seen/Few  Urinalysis, Complete  Result Value Ref Range   Specific Gravity, UA >1.030 (H) 1.005 - 1.030   pH, UA 6.0 5.0 - 7.5   Color, UA Yellow Yellow   Appearance Ur Clear Clear   Leukocytes,UA Negative Negative   Protein,UA 2+ (A) Negative/Trace   Glucose, UA Negative Negative   Ketones, UA Negative Negative   RBC, UA Negative Negative   Bilirubin, UA Negative Negative   Urobilinogen, Ur 0.2 0.2 - 1.0 mg/dL   Nitrite, UA Negative Negative   Microscopic Examination See below:    Assessment &  Plan:   1. Blood on toilet paper No hematuria on cath UA today.  Per chart review, her last 3 UAs have had no micro heme.  She does have calcium oxalate crystals  in her urine today, which could be a factor of dehydration given concentrated urine, however I offered her a CT stone study to rule out an acute stone episode, though again this would be rather inconsistent with no evidence of hematuria.  I offered her a pelvic exam today to look for bleeding lesions but she declined.  We also discussed following up with GYN for evaluation of possible postmenopausal bleeding, however she is adamant that the blood is coming from her urethral area.  Will send urine for atypical culture today as well and treat as indicated.  If everything is negative, I think her next step is GYN. - Urinalysis, Complete - Mycoplasma / ureaplasma culture - CT RENAL STONE STUDY; Future   Return for Will call with results.  Carman Ching, PA-C  [facility] [place] [address] [place], [place] [address] [phone]

## 2023-06-13 ENCOUNTER — Ambulatory Visit: Payer: 59 | Attending: Internal Medicine | Admitting: *Deleted

## 2023-06-13 DIAGNOSIS — Z7901 Long term (current) use of anticoagulants: Secondary | ICD-10-CM | POA: Diagnosis not present

## 2023-06-13 DIAGNOSIS — I351 Nonrheumatic aortic (valve) insufficiency: Secondary | ICD-10-CM | POA: Diagnosis not present

## 2023-06-13 DIAGNOSIS — Z952 Presence of prosthetic heart valve: Secondary | ICD-10-CM | POA: Diagnosis not present

## 2023-06-13 LAB — POCT INR: INR: 2.4 (ref 2.0–3.0)

## 2023-06-13 NOTE — Patient Instructions (Addendum)
Description   Take 1/2 tablet today, then continue taking Warfarin 1 tablet (5mg ) daily except 1/2 tablet (2.5mg ) on Mondays.  Keep the number of servings of green leafy vegetables consistent in your diet.  Recheck in 4 weeks  Coumadin Clinic [phone] call with medication changes and or bleeding.

## 2023-06-14 ENCOUNTER — Encounter (HOSPITAL_BASED_OUTPATIENT_CLINIC_OR_DEPARTMENT_OTHER): Payer: Self-pay | Admitting: Family Medicine

## 2023-06-15 ENCOUNTER — Other Ambulatory Visit (HOSPITAL_BASED_OUTPATIENT_CLINIC_OR_DEPARTMENT_OTHER): Payer: Self-pay | Admitting: *Deleted

## 2023-06-15 MED ORDER — VITAMIN D (ERGOCALCIFEROL) 1.25 MG (50000 UNIT) PO CAPS: 50000.0000 [IU] | ORAL_CAPSULE | ORAL | 0 refills | Status: DC

## 2023-06-16 LAB — MYCOPLASMA / UREAPLASMA CULTURE
Mycoplasma hominis Culture: POSITIVE — AB
Ureaplasma urealyticum: POSITIVE — AB

## 2023-06-18 ENCOUNTER — Other Ambulatory Visit: Payer: Self-pay | Admitting: Urology

## 2023-06-18 ENCOUNTER — Other Ambulatory Visit (HOSPITAL_BASED_OUTPATIENT_CLINIC_OR_DEPARTMENT_OTHER): Payer: Self-pay

## 2023-06-18 DIAGNOSIS — N390 Urinary tract infection, site not specified: Secondary | ICD-10-CM

## 2023-06-18 MED ORDER — PANTOPRAZOLE SODIUM 40 MG PO TBEC: 40.0000 mg | DELAYED_RELEASE_TABLET | Freq: Every day | ORAL | 3 refills | Status: DC

## 2023-06-18 MED ORDER — CLARITHROMYCIN 500 MG PO TABS
500.0000 mg | ORAL_TABLET | Freq: Two times a day (BID) | ORAL | 0 refills | Status: AC
Start: 2023-06-18 — End: ?

## 2023-06-19 ENCOUNTER — Telehealth: Payer: Self-pay | Admitting: *Deleted

## 2023-06-19 NOTE — Telephone Encounter (Addendum)
Pt called and stated that she is starting a week  course of clarithromycin 500mg  BID. Pt starting today. Instructed pt to take 1/2 a tablet of warfarin (2.5mg ) on Friday instead of 1 tablet (5mg ). Pt is going to come in on Monday 06/25/2023 due to transportation to have INR checked.

## 2023-06-20 ENCOUNTER — Ambulatory Visit: Payer: 59

## 2023-06-25 ENCOUNTER — Ambulatory Visit: Payer: Medicaid Other | Attending: Cardiology

## 2023-06-25 ENCOUNTER — Ambulatory Visit
Admission: RE | Admit: 2023-06-25 | Discharge: 2023-06-25 | Disposition: A | Discharge status: Home / Self Care | Payer: Medicaid Other | Source: Ambulatory Visit | Attending: Physician Assistant | Admitting: Physician Assistant

## 2023-06-25 DIAGNOSIS — R58 Hemorrhage, not elsewhere classified: Secondary | ICD-10-CM | POA: Diagnosis present

## 2023-06-25 DIAGNOSIS — I351 Nonrheumatic aortic (valve) insufficiency: Secondary | ICD-10-CM | POA: Diagnosis not present

## 2023-06-25 DIAGNOSIS — Z7901 Long term (current) use of anticoagulants: Secondary | ICD-10-CM

## 2023-06-25 DIAGNOSIS — Z952 Presence of prosthetic heart valve: Secondary | ICD-10-CM

## 2023-06-25 LAB — POCT INR: INR: 1.4 — AB (ref 2.0–3.0)

## 2023-06-25 NOTE — Patient Instructions (Signed)
Description   Take 1 tablet today and then continue taking Warfarin 1 tablet (5mg ) daily except 1/2 tablet (2.5mg ) on Mondays.  Keep the number of servings of green leafy vegetables consistent in your diet.  Recheck in 4 weeks  Coumadin Clinic [phone] call with medication changes and or bleeding.

## 2023-06-26 ENCOUNTER — Encounter (HOSPITAL_BASED_OUTPATIENT_CLINIC_OR_DEPARTMENT_OTHER): Payer: Self-pay

## 2023-06-26 ENCOUNTER — Other Ambulatory Visit (HOSPITAL_COMMUNITY): Payer: Self-pay

## 2023-06-26 NOTE — Telephone Encounter (Signed)
Good morning prior auth team, have we ever done prior auth for this patient and ozempic? If not can we please process to see if her insurance will cover ozempic or an alternative?

## 2023-06-26 NOTE — Telephone Encounter (Signed)
It will need a PA, but based on the test claim to the [facility] plan, they think she has other active coverage so they won't pay till that comes off. She will need to call them and tell them she doesn't have other coverage if she wants [employer] to pay or she will need to provide [place] with the other insurance if applicable.

## 2023-06-28 ENCOUNTER — Other Ambulatory Visit (HOSPITAL_BASED_OUTPATIENT_CLINIC_OR_DEPARTMENT_OTHER): Payer: Self-pay

## 2023-06-28 ENCOUNTER — Ambulatory Visit (HOSPITAL_BASED_OUTPATIENT_CLINIC_OR_DEPARTMENT_OTHER): Payer: Medicaid Other | Admitting: Cardiology

## 2023-06-28 ENCOUNTER — Other Ambulatory Visit (HOSPITAL_COMMUNITY): Payer: Self-pay

## 2023-06-28 ENCOUNTER — Telehealth: Payer: Self-pay

## 2023-06-28 ENCOUNTER — Telehealth (HOSPITAL_BASED_OUTPATIENT_CLINIC_OR_DEPARTMENT_OTHER): Payer: Self-pay

## 2023-06-28 ENCOUNTER — Encounter (HOSPITAL_BASED_OUTPATIENT_CLINIC_OR_DEPARTMENT_OTHER): Payer: Self-pay | Admitting: Cardiology

## 2023-06-28 VITALS — BP 122/62 | HR 121 | Ht 67.0 in | Wt 262.0 lb

## 2023-06-28 DIAGNOSIS — Z7901 Long term (current) use of anticoagulants: Secondary | ICD-10-CM | POA: Diagnosis not present

## 2023-06-28 DIAGNOSIS — I351 Nonrheumatic aortic (valve) insufficiency: Secondary | ICD-10-CM

## 2023-06-28 DIAGNOSIS — R5383 Other fatigue: Secondary | ICD-10-CM

## 2023-06-28 DIAGNOSIS — Z6841 Body Mass Index (BMI) 40.0 and over, adult: Secondary | ICD-10-CM

## 2023-06-28 DIAGNOSIS — Z952 Presence of prosthetic heart valve: Secondary | ICD-10-CM | POA: Diagnosis not present

## 2023-06-28 MED ORDER — SEMAGLUTIDE-WEIGHT MANAGEMENT 1 MG/0.5ML ~~LOC~~ SOAJ
1.0000 mg | SUBCUTANEOUS | 0 refills | Status: AC
Start: 2023-08-25 — End: 2023-09-22
  Filled 2023-06-28 – 2023-08-17 (×2): qty 2, 28d supply, fill #0

## 2023-06-28 MED ORDER — SEMAGLUTIDE-WEIGHT MANAGEMENT 1.7 MG/0.75ML ~~LOC~~ SOAJ
1.7000 mg | SUBCUTANEOUS | 0 refills | Status: DC
Start: 2023-09-23 — End: 2023-10-17
  Filled 2023-06-28: qty 3, 28d supply, fill #0

## 2023-06-28 MED ORDER — SEMAGLUTIDE-WEIGHT MANAGEMENT 0.25 MG/0.5ML ~~LOC~~ SOAJ
0.2500 mg | SUBCUTANEOUS | Status: AC
Start: 2023-06-28 — End: 2023-07-26

## 2023-06-28 MED ORDER — SEMAGLUTIDE-WEIGHT MANAGEMENT 2.4 MG/0.75ML ~~LOC~~ SOAJ
2.4000 mg | SUBCUTANEOUS | 11 refills | Status: DC
Start: 2023-10-22 — End: 2023-10-17
  Filled 2023-06-28: qty 3, fill #0

## 2023-06-28 MED ORDER — SEMAGLUTIDE-WEIGHT MANAGEMENT 0.5 MG/0.5ML ~~LOC~~ SOAJ
0.5000 mg | SUBCUTANEOUS | 0 refills | Status: DC
Start: 2023-07-27 — End: 2023-08-22
  Filled 2023-06-28: qty 2, 28d supply, fill #0

## 2023-06-28 NOTE — Patient Instructions (Addendum)
Medication Instructions:  START WEGOVY 0.25MG  INJECTION *If you need a refill on your cardiac medications before your next appointment, please call your pharmacy*  Lab Work: NONE If you have labs (blood work) drawn today and your tests are completely normal, you will receive your results only by: MyChart Message (if you have MyChart) OR  A paper copy in the mail If you have any lab test that is abnormal or we need to change your treatment, we will call you to review the results.  Testing/Procedures: NONE  Follow-Up: At [facility], you and your health needs are our priority.  As part of our continuing mission to provide you with exceptional heart care, we have created designated Provider Care Teams.  These Care Teams include your primary Cardiologist (physician) and Advanced Practice Providers (APPs -  Physician Assistants and Nurse Practitioners) who all work together to provide you with the care you need, when you need it.  Your next appointment:   3 month(s)  Provider:   Jodelle Red, MD    Other Instructions  Semaglutide (WEGOVY)starting dose plan: -0.25 mg dose every week for 4 weeks (sample), -Then use the 0.5 mg dose every week for 4 weeks, -Then use the 1 mg dose every week for 4 weeks, -Then use the 1.7 mg dose every week for 4 weeks, -Then use the 2.4 mg dose every week indefinitely  Some nausea is normal, as is decreased appetite. Instructed to call with severe nausea or stomach pain. Instructed on small meals.

## 2023-06-28 NOTE — Telephone Encounter (Signed)
PA request has been Submitted. New Encounter created for follow up. For additional info see Pharmacy Prior Auth telephone encounter from 06/28/23.

## 2023-06-28 NOTE — Telephone Encounter (Signed)
Pharmacy Patient Advocate Encounter   Received notification from Physician's Office that prior authorization for [facility] is required/requested.   Insurance verification completed.   The patient is insured through [facility] .   Per test claim: PA required; PA submitted to [facility] via CoverMyMeds Key/confirmation #/EOC [id number] Status is pending

## 2023-06-28 NOTE — Telephone Encounter (Signed)
Patient started on wegovy today, for weight indications, will need 0.5mg  and up. Office note updated with information for PA.

## 2023-06-28 NOTE — Progress Notes (Signed)
Cardiology Office Note:  .   Date:  06/28/2023  ID:  CYRIL CALENDER, DOB 1971-06-09, MRN [id number] PCP: de Peru, Raymond J, MD  [facility] Providers Cardiologist:  Jodelle Red, MD {  History of Present Illness: .   Mary Chambers is a 52 y.o. female with a hx of aortic valve endocarditis s/p AVR 2023, hypertension, obesity, arthritis, and depression, who is seen for follow-up   Today: Reviewed her hospitalization from June for pyelonephritis. Has required intermittent antibiotics. Awaiting read on CT renal from 9/23.  Asking about Neuriva supplement, reviewed today.   Fatigue slowly improving. LE intermittent, takes lasix once every 1-2 weeks with good results.  ROS: Denies chest pain, shortness of breath at rest or with normal exertion. No PND, orthopnea, LE edema or unexpected weight gain. No syncope or palpitations. ROS otherwise negative except as noted.   Studies Reviewed: Marland Kitchen    EKG:  EKG Interpretation Date/Time:  Thursday June 28 2023 10:50:02 EDT Ventricular Rate:  121 PR Interval:  148 QRS Duration:  78 QT Interval:  302 QTC Calculation: 428 R Axis:   18  Text Interpretation: Sinus tachycardia Confirmed by Jodelle Red [id number]) on 06/28/2023 1:20:56 PM    Physical Exam:   VS:  BP 122/62   Pulse (!) 121   Ht 5\' 7"  (1.702 m)   Wt 262 lb (118.8 kg) Comment: py stated this was her last weight , couldnt weigh today  SpO2 95%   BMI 41.04 kg/m    Wt Readings from Last 3 Encounters:  06/28/23 262 lb (118.8 kg)  04/11/23 292 lb (132.5 kg)  03/29/23 292 lb (132.5 kg)    GEN: Well nourished, well developed in no acute distress HEENT: Normal, moist mucous membranes NECK: No JVD CARDIAC: regular rhythm, normal S1 and S2, no rubs or gallops. No murmur. VASCULAR: Radial and DP pulses 2+ bilaterally. No carotid bruits RESPIRATORY:  Clear to auscultation without rales, wheezing or rhonchi  ABDOMEN: Soft, non-tender,  non-distended MUSCULOSKELETAL:  Ambulates independently SKIN: Warm and dry, no edema NEUROLOGIC:  Alert and oriented x 3. No focal neuro deficits noted. PSYCHIATRIC:  Normal affect    ASSESSMENT AND PLAN: .    Fatigue -slowly improving   Aortic valve endocarditis Severe aortic regurgitation Bicuspid aortic valve S/P AVR with 23mm On-X valve 11/07/21, Dr. Cliffton Asters -on aspirin, coumadin -has amoxicillin for dental prophylaxis   Hypertension LE edema -continue low dose amlodipine -only needs rare lasix   Morbid obesity: BMI 43->41 -discussed diet/exercise.  -on medicaid, GLP1RA starting to be covered.  -Dealing with a fraudulent insurance placed in her name through [employer], just able to terminate this account last week. Need to make sure that the claim is not sent to this fraudulent account  -Patient has a BMI of over 30 or over 27 with a weight related comorbidity -Current BMI: 41 -Starting weight 262 lbs -Weight related comorbidities: hypertension, hyperlipidemia -Has attempted weight loss with diet/exercise changes. Has tried shakes, sugar free diets, nutrisystem, slimfast, phentermine. Has not been able to lose >5% of weight on their own -Patient is medically appropriate for GLP treatment for weight loss -discussed potential side effects, including GI discomfort (nausea, constipation, diarrhea). Discussed data re: pancreatitis. No known history of multiple endocrine neoplasia.  Semaglutide starting dose plan: -0.25 mg dose every week for 4 weeks (sample), -Then use the 0.5 mg dose every week for 4 weeks, -Then use the 1 mg dose every week for 4 weeks, -Then use the  1.7 mg dose every week for 4 weeks, -Then use the 2.4 mg dose every week indefinitely  Some nausea is normal, as is decreased appetite. Instructed to call with severe nausea or stomach pain. Instructed on small meals.  Teaching done with demo pen today and samples given today.    CV risk counseling and  prevention -recommend heart healthy/Mediterranean diet, with whole grains, fruits, vegetable, fish, lean meats, nuts, and olive oil. Limit salt. -recommend moderate walking, 3-5 times/week for 30-50 minutes each session. Aim for at least 150 minutes.week. Goal should be pace of 3 miles/hours, or walking 1.5 miles in 30 minutes -recommend avoidance of tobacco products. Avoid excess alcohol. -ASCVD risk score: The 10-year ASCVD risk score (Arnett DK, et al., 2019) is: 1.8%   Values used to calculate the score:     Age: 73 years     Sex: Female     Is Non-Hispanic African American: No     Diabetic: No     Tobacco smoker: No     Systolic Blood Pressure: 122 mmHg     Is BP treated: Yes     HDL Cholesterol: 54 mg/dL     Total Cholesterol: 192 mg/dL    Dispo: 3 months  Signed, Jodelle Red, MD   Jodelle Red, MD, PhD, [facility]  [facility]    Heart & Vascular at [facility] [address], [address] [place], [place] [address] [phone]

## 2023-06-29 ENCOUNTER — Other Ambulatory Visit (HOSPITAL_COMMUNITY): Payer: Self-pay

## 2023-06-29 ENCOUNTER — Telehealth: Payer: Medicaid Other | Admitting: Family Medicine

## 2023-06-29 ENCOUNTER — Ambulatory Visit (HOSPITAL_BASED_OUTPATIENT_CLINIC_OR_DEPARTMENT_OTHER): Payer: Medicaid Other | Admitting: Cardiology

## 2023-06-29 DIAGNOSIS — B370 Candidal stomatitis: Secondary | ICD-10-CM | POA: Diagnosis not present

## 2023-06-29 MED ORDER — NYSTATIN 100000 UNIT/ML MT SUSP: 5.0000 mL | Freq: Four times a day (QID) | OROMUCOSAL | 0 refills | Status: AC

## 2023-06-29 NOTE — Telephone Encounter (Signed)
Pharmacy Patient Advocate Encounter  Received notification from [facility] that Prior Authorization for [facility] has been APPROVED from 06/28/23 to 12/26/23  Test claim still has same rejection from before that member has another primary payer.

## 2023-06-29 NOTE — Progress Notes (Signed)
E-Visit for Mary Chambers  We are sorry that you are not feeling well.  Here is how we plan to help!  Based on what you have shared with me, it appears that you do have thrush.     The following medications should decrease the discomfort and help with healing.   I will send nystatin mouth wash.   This is probably due to recurrent antibiotic use.   Please follow up with your treating provider if symptoms persist or worsen.   Thank you for choosing an e-visit.  Your e-visit answers were reviewed by a board certified advanced clinical practitioner to complete your personal care plan. Depending upon the condition, your plan could have included both over the counter or prescription medications.  Please review your pharmacy choice. Make sure the pharmacy is open so you can pick up prescription now. If there is a problem, you may contact your provider through [employer] and have the prescription routed to another pharmacy.  Your safety is important to [place]. If you have drug allergies check your prescription carefully.   For the next 24 hours you can use MyChart to ask questions about today's visit, request a non-urgent call back, or ask for a work or school excuse. You will get an email in the next two days asking about your experience. I hope that your e-visit has been valuable and will speed your recovery.

## 2023-06-30 ENCOUNTER — Other Ambulatory Visit (HOSPITAL_BASED_OUTPATIENT_CLINIC_OR_DEPARTMENT_OTHER): Payer: Self-pay | Admitting: Cardiology

## 2023-06-30 DIAGNOSIS — Q231 Congenital insufficiency of aortic valve: Secondary | ICD-10-CM

## 2023-07-02 ENCOUNTER — Encounter (HOSPITAL_BASED_OUTPATIENT_CLINIC_OR_DEPARTMENT_OTHER): Payer: Self-pay | Admitting: Family Medicine

## 2023-07-02 ENCOUNTER — Ambulatory Visit (HOSPITAL_BASED_OUTPATIENT_CLINIC_OR_DEPARTMENT_OTHER): Payer: Medicaid Other | Admitting: Family Medicine

## 2023-07-02 ENCOUNTER — Other Ambulatory Visit (HOSPITAL_BASED_OUTPATIENT_CLINIC_OR_DEPARTMENT_OTHER): Payer: Self-pay

## 2023-07-02 ENCOUNTER — Other Ambulatory Visit: Payer: Self-pay

## 2023-07-02 DIAGNOSIS — F419 Anxiety disorder, unspecified: Secondary | ICD-10-CM | POA: Diagnosis not present

## 2023-07-02 DIAGNOSIS — R7303 Prediabetes: Secondary | ICD-10-CM | POA: Diagnosis not present

## 2023-07-02 DIAGNOSIS — D649 Anemia, unspecified: Secondary | ICD-10-CM | POA: Diagnosis not present

## 2023-07-02 DIAGNOSIS — R292 Abnormal reflex: Secondary | ICD-10-CM | POA: Insufficient documentation

## 2023-07-02 DIAGNOSIS — R5383 Other fatigue: Secondary | ICD-10-CM

## 2023-07-02 DIAGNOSIS — Z23 Encounter for immunization: Secondary | ICD-10-CM | POA: Diagnosis not present

## 2023-07-02 DIAGNOSIS — F32A Depression, unspecified: Secondary | ICD-10-CM

## 2023-07-02 MED ORDER — ESCITALOPRAM OXALATE 20 MG PO TABS
20.0000 mg | ORAL_TABLET | Freq: Every day | ORAL | 1 refills | Status: DC
Start: 2023-07-02 — End: 2024-01-14

## 2023-07-02 NOTE — Progress Notes (Unsigned)
    Procedures performed today:    None.  Independent interpretation of notes and tests performed by another provider:   None.  Brief History, Exam, Impression, and Recommendations:    BP 136/84 (BP Location: Left Arm, Patient Position: Sitting, Cuff Size: Normal)   Pulse (!) 58   Ht 5\' 7"  (1.702 m)   Wt 283 lb 14.4 oz (128.8 kg)   SpO2 94%   BMI 44.47 kg/m   There are no diagnoses linked to this encounter.No follow-ups on file.   ___________________________________________ Kohan Azizi de Peru, MD, ABFM, [facility] Sports Medicine [facility]

## 2023-07-02 NOTE — Telephone Encounter (Signed)
Warfarin refill please  

## 2023-07-03 LAB — CBC WITH DIFFERENTIAL/PLATELET
Basophils Absolute: 0.1 10*3/uL (ref 0.0–0.2)
Basos: 1 %
EOS (ABSOLUTE): 0.2 10*3/uL (ref 0.0–0.4)
Eos: 2 %
Hematocrit: 46.7 % — ABNORMAL HIGH (ref 34.0–46.6)
Hemoglobin: 14.4 g/dL (ref 11.1–15.9)
Immature Grans (Abs): 0 10*3/uL (ref 0.0–0.1)
Immature Granulocytes: 0 %
Lymphocytes Absolute: 3.1 10*3/uL (ref 0.7–3.1)
Lymphs: 38 %
MCH: 25.5 pg — ABNORMAL LOW (ref 26.6–33.0)
MCHC: 30.8 g/dL — ABNORMAL LOW (ref 31.5–35.7)
MCV: 83 fL (ref 79–97)
Monocytes Absolute: 0.6 10*3/uL (ref 0.1–0.9)
Monocytes: 8 %
Neutrophils Absolute: 4 10*3/uL (ref 1.4–7.0)
Neutrophils: 51 %
Platelets: 339 10*3/uL (ref 150–450)
RBC: 5.65 x10E6/uL — ABNORMAL HIGH (ref 3.77–5.28)
RDW: 17 % — ABNORMAL HIGH (ref 11.7–15.4)
WBC: 8 10*3/uL (ref 3.4–10.8)

## 2023-07-03 LAB — HEMOGLOBIN A1C
Est. average glucose Bld gHb Est-mCnc: 120 mg/dL
Hgb A1c MFr Bld: 5.8 % — ABNORMAL HIGH (ref 4.8–5.6)

## 2023-07-03 LAB — TSH RFX ON ABNORMAL TO FREE T4: TSH: 2.16 u[IU]/mL (ref 0.450–4.500)

## 2023-07-03 NOTE — Assessment & Plan Note (Signed)
Patient with slightly elevated A1c at 5.7% about 1 year ago.  Patient has been focusing on lifestyle modifications.  Due for recheck of hemoglobin A1c at this time.  Denies any new symptoms such as polyuria or polydipsia. Recommend continuing with lifestyle modifications at this time, we will check A1c today

## 2023-07-03 NOTE — Assessment & Plan Note (Signed)
Patient reports that she has been doing well with medication currently and is requesting refill today.  No significant side effects from medication. Refill of medication sent to pharmacy on file

## 2023-07-03 NOTE — Assessment & Plan Note (Signed)
Patient reports ongoing fatigue.  This has been an issue in the past, however had been doing fairly well/stable with this.  Over the past few days to 1 week, patient has had some increase in fatigue.  In particular, she notes that when transferring/walking short distances that she will become more winded and need to rest.  She is not aware of any specific changes, however did receive vaccines recently.  She did have appointment with cardiology at the end of last week, however she had only recently began to notice the symptoms and did not feel that they were worth mentioning at that time. On exam, patient is in no acute distress, vital signs stable.  Cardiovascular exam with regular rate and rhythm, mechanical click, no murmur.  Lungs clear to auscultation bilaterally. Uncertain cause for exacerbation in symptoms of fatigue.  Could be related to recent vaccines.  Additional considerations include anemia, thyroid dysfunction.  Given cardiac history, this would also be within the differential.  Can proceed with laboratory assessment today as below with recommendation to have sooner follow-up with cardiology should initial labs be normal

## 2023-07-04 ENCOUNTER — Encounter (HOSPITAL_BASED_OUTPATIENT_CLINIC_OR_DEPARTMENT_OTHER): Payer: Self-pay | Admitting: Family Medicine

## 2023-07-05 ENCOUNTER — Encounter (HOSPITAL_BASED_OUTPATIENT_CLINIC_OR_DEPARTMENT_OTHER): Payer: Self-pay

## 2023-07-05 ENCOUNTER — Other Ambulatory Visit: Payer: Self-pay | Admitting: Family

## 2023-07-05 DIAGNOSIS — M5441 Lumbago with sciatica, right side: Secondary | ICD-10-CM

## 2023-07-05 NOTE — Telephone Encounter (Signed)
No call documented in chart,   She states she saw Dr. Cristal Deer last thursday, she has been very fatigued since this appointment. She notes her bathroom is only about 25 steps from her bedroom and this takes everything out of there. She is also sweating really bad and feeling very shaky. She notes that she did talk to Dr. Ihor Dow and he ran labs but everything was normal and told her to call us. She has felt this way for about a week. She notes no changes with medications, stress, diet or exercise   Advised will send her information over to Dr. Cristal Deer and we will give her a call when we hear back from her.

## 2023-07-09 ENCOUNTER — Ambulatory Visit (HOSPITAL_BASED_OUTPATIENT_CLINIC_OR_DEPARTMENT_OTHER): Payer: Medicaid Other | Admitting: Family Medicine

## 2023-07-11 ENCOUNTER — Encounter (HOSPITAL_BASED_OUTPATIENT_CLINIC_OR_DEPARTMENT_OTHER): Payer: Self-pay | Admitting: Family Medicine

## 2023-07-13 ENCOUNTER — Telehealth: Payer: Medicaid Other | Admitting: Emergency Medicine

## 2023-07-13 DIAGNOSIS — R1110 Vomiting, unspecified: Secondary | ICD-10-CM | POA: Diagnosis not present

## 2023-07-13 MED ORDER — ONDANSETRON HCL 4 MG PO TABS: 4.0000 mg | ORAL_TABLET | Freq: Three times a day (TID) | ORAL | 0 refills | Status: DC | PRN

## 2023-07-13 NOTE — Progress Notes (Signed)
E-Visit for Vomiting  We are sorry that you are not feeling well. Here is how we plan to help!  Based on what you have shared with me it looks like you have a Virus that is irritating your GI tract.  Vomiting is the forceful emptying of a portion of the stomach's content through the mouth.  Although nausea and vomiting can make you feel miserable, it's important to remember that these are not diseases, but rather symptoms of an underlying illness.  When we treat short term symptoms, we always caution that any symptoms that persist should be fully evaluated in a medical office.  I have prescribed a medication that will help alleviate your symptoms and allow you to stay hydrated:  Zofran 4 mg 1 tablet every 8 hours as needed for nausea and vomiting  HOME CARE: Drink clear liquids.  This is very important! Dehydration (the lack of fluid) can lead to a serious complication.  Start off with 1 tablespoon every 5 minutes for 8 hours. You may begin eating bland foods after 8 hours without vomiting.  Start with saltine crackers, white bread, rice, mashed potatoes, applesauce. After 48 hours on a bland diet, you may resume a normal diet. Try to go to sleep.  Sleep often empties the stomach and relieves the need to vomit.  GET HELP RIGHT AWAY IF:  Your symptoms do not improve or worsen within 2 days after treatment. You have a fever for over 3 days. You cannot keep down fluids after trying the medication. You vomit any blood  MAKE SURE YOU:  Understand these instructions. Will watch your condition. Will get help right away if you are not doing well or get worse.   Thank you for choosing an e-visit.  Your e-visit answers were reviewed by a board certified advanced clinical practitioner to complete your personal care plan. Depending upon the condition, your plan could have included both over the counter or prescription medications.  Please review your pharmacy choice. Make sure the pharmacy is  open so you can pick up prescription now. If there is a problem, you may contact your provider through [employer] and have the prescription routed to another pharmacy.  Your safety is important to [place]. If you have drug allergies check your prescription carefully.   For the next 24 hours you can use MyChart to ask questions about today's visit, request a non-urgent call back, or ask for a work or school excuse. You will get an email in the next two days asking about your experience. I hope that your e-visit has been valuable and will speed your recovery.  I have spent 5 minutes in review of e-visit questionnaire, review and updating patient chart, medical decision making and response to patient.   Rica Mast, PhD, FNP-BC

## 2023-07-15 NOTE — Progress Notes (Signed)
have provided 5 minutes of non face to face time during this encounter for chart review and documentation.

## 2023-07-26 ENCOUNTER — Other Ambulatory Visit (HOSPITAL_BASED_OUTPATIENT_CLINIC_OR_DEPARTMENT_OTHER): Payer: Self-pay | Admitting: Cardiology

## 2023-07-26 DIAGNOSIS — E7800 Pure hypercholesterolemia, unspecified: Secondary | ICD-10-CM

## 2023-07-27 ENCOUNTER — Telehealth: Payer: Self-pay

## 2023-07-27 ENCOUNTER — Emergency Department
Admission: EM | Admit: 2023-07-27 | Discharge: 2023-07-27 | Disposition: A | Discharge status: Home / Self Care | Payer: Medicaid Other | Attending: Emergency Medicine | Admitting: Emergency Medicine

## 2023-07-27 ENCOUNTER — Encounter (HOSPITAL_BASED_OUTPATIENT_CLINIC_OR_DEPARTMENT_OTHER): Payer: Self-pay

## 2023-07-27 ENCOUNTER — Other Ambulatory Visit: Payer: Self-pay

## 2023-07-27 ENCOUNTER — Emergency Department: Payer: Medicaid Other

## 2023-07-27 DIAGNOSIS — Z7901 Long term (current) use of anticoagulants: Secondary | ICD-10-CM | POA: Insufficient documentation

## 2023-07-27 DIAGNOSIS — W01190A Fall on same level from slipping, tripping and stumbling with subsequent striking against furniture, initial encounter: Secondary | ICD-10-CM | POA: Diagnosis not present

## 2023-07-27 DIAGNOSIS — I10 Essential (primary) hypertension: Secondary | ICD-10-CM | POA: Insufficient documentation

## 2023-07-27 DIAGNOSIS — S0990XA Unspecified injury of head, initial encounter: Secondary | ICD-10-CM

## 2023-07-27 DIAGNOSIS — W19XXXA Unspecified fall, initial encounter: Secondary | ICD-10-CM

## 2023-07-27 NOTE — ED Notes (Signed)
Dc instructions reviewed with pt no questions at this time.

## 2023-07-27 NOTE — ED Provider Notes (Signed)
[facility] Provider Note    Event Date/Time   First MD Initiated Contact with Patient 07/27/23 1328     (approximate)   History   Fall   HPI  [name] is a 52 y.o. female with a past medical history of hypertension, aortic valve replacement, on anticoagulation who presents today for evaluation after a slip and fall.  Patient reports that she stepped on a dryer sheet and slipped and fell and struck her head on the dryer door.  She denies LOC.  This happened at approximately 10:30 AM this morning.  She has been able to ambulate, but reports that she also landed on her right side and has pain in her right hip and her right knee.  She feels that " these are fine" she is just worried about her head given that she is on a blood thinner.  She denies visual changes, vomiting, neck pain, paresthesias, or weakness.  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   Iron deficiency anemia 03/29/2023   Vitamin D deficiency 03/29/2023   Pyelonephritis 03/23/2023   Acute pyelonephritis 03/22/2023   E coli bacteremia 03/22/2023   Wellness examination 07/27/2022   Lumbar pseudoarthrosis 12/22/2021   Idiopathic scoliosis and kyphoscoliosis 12/22/2021   Prediabetes 12/08/2021   Long term (current) use of anticoagulants 11/17/2021   S/P AVR (aortic valve replacement) 11/07/2021   Severe aortic regurgitation 07/27/2021   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   Septic arthritis (HCC) 06/23/2021   Gastroesophageal reflux disease 12/15/2020   Screening for colorectal cancer 12/15/2020   History of gastric ulcer 12/15/2020   Thoracic aortic aneurysm (HCC) 05/12/2020   Lumbar spinal stenosis 10/01/2019   Nephrolithiasis 07/29/2019   Prepyloric ulcer 07/17/2018   Sleep apnea 06/25/2018   Pain in left foot 03/29/2018   Migraines 02/13/2018   Morbidly obese (HCC) 02/13/2018   Synovial cyst of lumbar spine 10/15/2017   Osteoarthritis  of knee 07/13/2017   Lumbar disc disease with radiculopathy 08/01/2016   Fatigue 05/04/2016   Headache 05/04/2016   Numbness of foot 05/04/2016   Lumbar stenosis with neurogenic claudication 05/07/2015   Bilateral leg edema 04/21/2015   Renal atrophy, left 01/23/2014   Right lower quadrant pain 12/18/2013   Hypertension, benign 06/03/2010   Anxiety and depression 10/08/2007          Physical Exam   Triage Vital Signs: ED Triage Vitals  Encounter Vitals Group     BP 07/27/23 1322 134/88     Systolic BP Percentile --      Diastolic BP Percentile --      Pulse Rate 07/27/23 1322 93     Resp 07/27/23 1322 18     Temp 07/27/23 1322 98 F (36.7 C)     Temp src --      SpO2 07/27/23 1322 100 %     Weight --      Height --      Head Circumference --      Peak Flow --      Pain Score 07/27/23 1321 7     Pain Loc --      Pain Education --      Exclude from Growth Chart --     Most recent vital signs: Vitals:   07/27/23 1322 07/27/23 1514  BP: 134/88 128/81  Pulse: 93 90  Resp: 18 18  Temp: 98 F (36.7 C) 98 F (36.7 C)  SpO2: 100% 100%  Physical Exam Vitals and nursing note reviewed.  Constitutional:      General: Awake and alert. No acute distress.    Appearance: Normal appearance. The patient is normal weight.  HENT:     Head: Normocephalic and atraumatic.     Mouth: Mucous membranes are moist.  Eyes:     General: PERRL. Normal EOMs        Right eye: No discharge.        Left eye: No discharge.     Conjunctiva/sclera: Conjunctivae normal.  Cardiovascular:     Rate and Rhythm: Normal rate and regular rhythm.     Pulses: Normal pulses.  Pulmonary:     Effort: Pulmonary effort is normal. No respiratory distress.     Breath sounds: Normal breath sounds.  Abdominal:     Abdomen is soft. There is no abdominal tenderness. No rebound or guarding. No distention. Musculoskeletal:        General: No swelling. Normal range of motion.     Cervical back: Normal  range of motion and neck supple. No midline cervical spine tenderness.  Full range of motion of neck.  Negative Spurling test.  Negative Lhermitte sign.  Normal strength and sensation in bilateral upper extremities. Normal grip strength bilaterally.  Normal intrinsic muscle function of the hand bilaterally.  Normal radial pulses bilaterally. Pelvis stable.  Full and normal range of motion of bilateral hips, knees, ankles.  Ambulatory with a steady gait. No midline vertebral tenderness. Skin:    General: Skin is warm and dry.     Capillary Refill: Capillary refill takes less than 2 seconds.     Findings: Superficial area of ecchymosis to right forehead.  No laceration. Neurological:     Mental Status: The patient is awake and alert.   Neurological: GCS 15 alert and oriented x3 Normal speech, no expressive or receptive aphasia or dysarthria Cranial nerves II through XII intact Normal visual fields 5 out of 5 strength in all 4 extremities with intact sensation throughout No extremity drift Normal finger-to-nose testing, no limb or truncal ataxia    ED Results / Procedures / Treatments   Labs (all labs ordered are listed, but only abnormal results are displayed) Labs Reviewed - No data to display   EKG     RADIOLOGY I independently reviewed and interpreted imaging and agree with radiologists findings.     PROCEDURES:  Critical Care performed:   Procedures   MEDICATIONS ORDERED IN ED: Medications - No data to display   IMPRESSION / MDM / ASSESSMENT AND PLAN / ED COURSE  I reviewed the triage vital signs and the nursing notes.   Differential diagnosis includes, but is not limited to, contusion, concussion, intracranial hemorrhage, skull fracture, cervical spine injury.  Patient is awake and alert, hemodynamically stable and neurologically intact.  She is nontoxic in appearance.  CT head and neck obtained in triage given mechanism of injury and given that she is  anticoagulated.  This was negative for any acute findings.  She has normal range of motion of bilateral hips, knees, ankles, she is ambulatory with a steady gait, normal range of motion of upper extremities, do not suspect any extremity fracture.  There is no evidence of hematoma.  She was offered imaging but declined.  We discussed the possibility though low likelihood of a delayed head bleed given that she is on anticoagulation.  We also discussed strict return precautions and the importance of close outpatient follow-up.  Patient understands and agrees with  plan.  She was discharged in stable condition.   Patient's presentation is most consistent with acute complicated illness / injury requiring diagnostic workup.    FINAL CLINICAL IMPRESSION(S) / ED DIAGNOSES   Final diagnoses:  Injury of head, initial encounter  Fall, initial encounter     Rx / DC Orders   ED Discharge Orders     None        Note:  This document was prepared using Dragon voice recognition software and may include unintentional dictation errors.   Jackelyn Hoehn, PA-C 07/27/23 1637    Shaune Pollack, MD 08/03/23 [phone]

## 2023-07-27 NOTE — Discharge Instructions (Signed)
Your CT scans were normal.  Please follow-up with your outpatient provider.  Please return for any new, worsening, or change in symptoms or other concerns including worsening headache, visual changes, numbness, tingling, weakness, vomiting, or any other concerns.  It was a pleasure caring for you today.

## 2023-07-27 NOTE — ED Triage Notes (Signed)
Pt comes with c/o fall this morning. Pt states she slipped on dryer sheet and fell. Pt did hit her head  but no loc. Pt is on thinner. Pt states whole right side hurts

## 2023-07-27 NOTE — Telephone Encounter (Signed)
Patient called to report fall today and contact to the head.  She went to ED for further evaluation and found no further trauma to the head.  She has bruising to the right side of the body.  She just wanted to inform us.

## 2023-07-30 ENCOUNTER — Other Ambulatory Visit (HOSPITAL_BASED_OUTPATIENT_CLINIC_OR_DEPARTMENT_OTHER): Payer: Self-pay | Admitting: Family Medicine

## 2023-08-01 ENCOUNTER — Ambulatory Visit: Payer: Medicaid Other | Attending: Cardiology | Admitting: *Deleted

## 2023-08-01 DIAGNOSIS — Z952 Presence of prosthetic heart valve: Secondary | ICD-10-CM | POA: Diagnosis not present

## 2023-08-01 DIAGNOSIS — I351 Nonrheumatic aortic (valve) insufficiency: Secondary | ICD-10-CM

## 2023-08-01 DIAGNOSIS — Z7901 Long term (current) use of anticoagulants: Secondary | ICD-10-CM | POA: Diagnosis not present

## 2023-08-01 LAB — POCT INR: INR: 2.2 (ref 2.0–3.0)

## 2023-08-01 NOTE — Patient Instructions (Signed)
Description   Take 1/2 tablet today and then continue taking Warfarin 1 tablet (5mg ) daily except 1/2 tablet (2.5mg ) on Mondays.  Keep the number of servings of green leafy vegetables consistent in your diet.  Recheck in 5 weeks.  Coumadin [facility] [phone] call with medication changes and or bleeding.

## 2023-08-05 ENCOUNTER — Encounter (HOSPITAL_BASED_OUTPATIENT_CLINIC_OR_DEPARTMENT_OTHER): Payer: Self-pay

## 2023-08-05 ENCOUNTER — Other Ambulatory Visit (HOSPITAL_BASED_OUTPATIENT_CLINIC_OR_DEPARTMENT_OTHER): Payer: Self-pay | Admitting: Cardiology

## 2023-08-05 ENCOUNTER — Other Ambulatory Visit (HOSPITAL_BASED_OUTPATIENT_CLINIC_OR_DEPARTMENT_OTHER): Payer: Self-pay | Admitting: Emergency Medicine

## 2023-08-05 ENCOUNTER — Telehealth: Payer: Medicaid Other | Admitting: Physician Assistant

## 2023-08-05 DIAGNOSIS — B9689 Other specified bacterial agents as the cause of diseases classified elsewhere: Secondary | ICD-10-CM | POA: Diagnosis not present

## 2023-08-05 DIAGNOSIS — J019 Acute sinusitis, unspecified: Secondary | ICD-10-CM | POA: Diagnosis not present

## 2023-08-05 MED ORDER — AMOXICILLIN-POT CLAVULANATE 875-125 MG PO TABS: 1.0000 | ORAL_TABLET | Freq: Two times a day (BID) | ORAL | 0 refills | Status: DC

## 2023-08-05 NOTE — Progress Notes (Addendum)
E-Visit for Sinus Problems  We are sorry that you are not feeling well.  Here is how we plan to help!  Based on what you have shared with me it looks like you have sinusitis.  Sinusitis is inflammation and infection in the sinus cavities of the head.  Based on your presentation I believe you most likely have Acute Bacterial Sinusitis.  This is an infection caused by bacteria and is treated with antibiotics. I have prescribed Augmentin 875mg /125mg  one tablet twice daily with food, for 7 days. You may use an oral decongestant such as Mucinex D or if you have glaucoma or high blood pressure use plain Mucinex. Saline nasal spray help and can safely be used as often as needed for congestion.  If you develop worsening sinus pain, fever or notice severe headache and vision changes, or if symptoms are not better after completion of antibiotic, please schedule an appointment with a health care provider.    Sinus infections are not as easily transmitted as other respiratory infection, however we still recommend that you avoid close contact with loved ones, especially the very young and elderly.  Remember to wash your hands thoroughly throughout the day as this is the number one way to prevent the spread of infection!  Home Care: Only take medications as instructed by your medical team. Complete the entire course of an antibiotic. Do not take these medications with alcohol. A steam or ultrasonic humidifier can help congestion.  You can place a towel over your head and breathe in the steam from hot water coming from a faucet. Avoid close contacts especially the very young and the elderly. Cover your mouth when you cough or sneeze. Always remember to wash your hands.  Get Help Right Away If: You develop worsening fever or sinus pain. You develop a severe head ache or visual changes. Your symptoms persist after you have completed your treatment plan.  Make sure you Understand these instructions. Will watch  your condition. Will get help right away if you are not doing well or get worse.  Thank you for choosing an e-visit.  Your e-visit answers were reviewed by a board certified advanced clinical practitioner to complete your personal care plan. Depending upon the condition, your plan could have included both over the counter or prescription medications.  Please review your pharmacy choice. Make sure the pharmacy is open so you can pick up prescription now. If there is a problem, you may contact your provider through [employer] and have the prescription routed to another pharmacy.  Your safety is important to [place]. If you have drug allergies check your prescription carefully.   For the next 24 hours you can use MyChart to ask questions about today's visit, request a non-urgent call back, or ask for a work or school excuse. You will get an email in the next two days asking about your experience. I hope that your e-visit has been valuable and will speed your recovery.   Christeen Douglas, PA-C

## 2023-08-06 MED ORDER — METOPROLOL TARTRATE 25 MG PO TABS: 12.5000 mg | ORAL_TABLET | Freq: Two times a day (BID) | ORAL | 3 refills | Status: DC

## 2023-08-12 ENCOUNTER — Telehealth: Payer: Medicaid Other | Admitting: Emergency Medicine

## 2023-08-12 ENCOUNTER — Encounter (HOSPITAL_BASED_OUTPATIENT_CLINIC_OR_DEPARTMENT_OTHER): Payer: Self-pay | Admitting: Family Medicine

## 2023-08-12 DIAGNOSIS — R1110 Vomiting, unspecified: Secondary | ICD-10-CM

## 2023-08-13 NOTE — Progress Notes (Signed)
Because this is your 2nd Evisit for vomiting in a month, I am worried that something other than a viral illness is going on, and I feel your condition warrants further evaluation and I recommend that you be seen in a face to face visit.   NOTE: There will be NO CHARGE for this eVisit   If you are having a true medical emergency please call 911.      For an urgent face to face visit, [facility] has eight urgent care centers for your convenience:   NEW!! [facility] at [facility] Get Driving Directions [phone] [address] [place], [address]    [facility] Center at [facility] Get Driving Directions [phone] [address] [place], [place] [address]   [facility] Urgent Care Center [facility]) Get Driving Directions [phone] [address] [place], [place] [address]  [facility] Center [facility]) Get Driving Directions [phone] [address] [place],  [place]  [address]  [facility] Center [facility] - at [employer]  [phone] [address] [place],  [place] [address]   [facility] Health Urgent Care at [facility] Get Driving Directions [phone] [address] [place], [place] [address]   [facility] Health Urgent Care at [facility] Get Driving Directions  [phone] [address] [place], [place] [address]   [facility] Health Urgent Care at [facility] Directions [phone] [address] [place], [place] [address]  Your MyChart E-visit questionnaire answers were reviewed by a board certified advanced clinical practitioner to complete your personal care plan based on your specific symptoms.  Thank you for using e-Visits.

## 2023-08-16 ENCOUNTER — Other Ambulatory Visit (HOSPITAL_BASED_OUTPATIENT_CLINIC_OR_DEPARTMENT_OTHER): Payer: Self-pay | Admitting: Emergency Medicine

## 2023-08-16 MED ORDER — ONDANSETRON HCL 4 MG PO TABS: 4.0000 mg | ORAL_TABLET | Freq: Three times a day (TID) | ORAL | 0 refills | Status: DC | PRN

## 2023-08-17 ENCOUNTER — Other Ambulatory Visit (HOSPITAL_BASED_OUTPATIENT_CLINIC_OR_DEPARTMENT_OTHER): Payer: Self-pay

## 2023-08-17 ENCOUNTER — Encounter (HOSPITAL_BASED_OUTPATIENT_CLINIC_OR_DEPARTMENT_OTHER): Payer: Self-pay

## 2023-08-22 ENCOUNTER — Other Ambulatory Visit (HOSPITAL_BASED_OUTPATIENT_CLINIC_OR_DEPARTMENT_OTHER): Payer: Self-pay

## 2023-08-22 ENCOUNTER — Encounter (HOSPITAL_BASED_OUTPATIENT_CLINIC_OR_DEPARTMENT_OTHER): Payer: Self-pay | Admitting: Family Medicine

## 2023-08-22 ENCOUNTER — Ambulatory Visit (INDEPENDENT_AMBULATORY_CARE_PROVIDER_SITE_OTHER): Payer: Medicaid Other | Admitting: Family Medicine

## 2023-08-22 VITALS — BP 138/92 | HR 100 | Ht 67.0 in | Wt 274.6 lb

## 2023-08-22 DIAGNOSIS — R112 Nausea with vomiting, unspecified: Secondary | ICD-10-CM | POA: Diagnosis not present

## 2023-08-22 NOTE — Assessment & Plan Note (Signed)
Patient reports that about 3 to 4 weeks she has been dealing with issues of nausea and vomiting with occasional watery diarrhea.  She has had some abdominal pain, primarily located centrally across abdomen.  She has had prior E-visits, after a second 1, she was advised that she should have face-to-face evaluation given ongoing symptoms.  She was prescribed Zofran to help with symptoms, did provide some relief.  She notes that nausea and vomiting has occurred with variety of foods.  She has had occasional days where she has felt better and would try to eat and then symptoms would return.  Has had some blood noted with diarrhea.  Will occasionally notice blood when wiping.  Of note, she has been taking Wegovy, recently took for 0.5 mg dose.  This was being prescribed by her cardiologist.  She did note some mild symptoms upon initiating medication at 0.25 mg dose. On exam, patient is in no acute distress, vital signs stable.  Lungs clear to auscultation bilaterally, cardiovascular exam with slightly elevated rate, regular rhythm.  Abdomen with normal bowel sounds, soft, nontender. Discussed potential considerations related to observed symptoms.  We can proceed with initial laboratory assessment today for further evaluation.  Discussed that symptoms could also be related to side effect of Wegovy.  At this time, we will have patient stop Wegovy, we will check labs today.  Will also place referral to GI.  If symptoms do resolve with discontinuation of Wegovy, would suspect that this was related to medication.  Would be reasonable to cancel referral to GI if symptoms do resolve.

## 2023-08-22 NOTE — Progress Notes (Addendum)
    Procedures performed today:    None.  Independent interpretation of notes and tests performed by another provider:   None.  Brief History, Exam, Impression, and Recommendations:    BP (!) 138/92 (BP Location: Right Arm, Patient Position: Sitting, Cuff Size: Normal)   Pulse 100   Ht 5\' 7"  (1.702 m)   Wt 274 lb 9.6 oz (124.6 kg)   SpO2 93%   BMI 43.01 kg/m   Nausea and vomiting, unspecified vomiting type Assessment & Plan: Patient reports that about 3 to 4 weeks she has been dealing with issues of nausea and vomiting with occasional watery diarrhea.  She has had some abdominal pain, primarily located centrally across abdomen.  She has had prior E-visits, after a second 1, she was advised that she should have face-to-face evaluation given ongoing symptoms.  She was prescribed Zofran to help with symptoms, did provide some relief.  She notes that nausea and vomiting has occurred with variety of foods.  She has had occasional days where she has felt better and would try to eat and then symptoms would return.  Has had some blood noted with diarrhea.  Will occasionally notice blood when wiping.  Of note, she has been taking Wegovy, recently took for 0.5 mg dose.  This was being prescribed by her cardiologist.  She did note some mild symptoms upon initiating medication at 0.25 mg dose. On exam, patient is in no acute distress, vital signs stable.  Lungs clear to auscultation bilaterally, cardiovascular exam with slightly elevated rate, regular rhythm.  Abdomen with normal bowel sounds, soft, nontender. Discussed potential considerations related to observed symptoms.  We can proceed with initial laboratory assessment today for further evaluation.  Discussed that symptoms could also be related to side effect of Wegovy.  At this time, we will have patient stop Wegovy, we will check labs today.  Will also place referral to GI.  If symptoms do resolve with discontinuation of Wegovy, would suspect that  this was related to medication.  Would be reasonable to cancel referral to GI if symptoms do resolve.  Orders: -     CBC with Differential/Platelet -     Comprehensive metabolic panel -     Lipase -     Ambulatory referral to Gastroenterology  Patient does require incontinence supplies which are obtained from local DME company. Order form completed and faxed for patient.   ___________________________________________ Orvetta Danielski de [place], MD, ABFM, [facility] Primary Care and Sports Medicine [facility]

## 2023-08-23 ENCOUNTER — Encounter (HOSPITAL_BASED_OUTPATIENT_CLINIC_OR_DEPARTMENT_OTHER): Payer: Self-pay | Admitting: Family Medicine

## 2023-08-23 LAB — COMPREHENSIVE METABOLIC PANEL
ALT: 12 [IU]/L (ref 0–32)
AST: 24 [IU]/L (ref 0–40)
Albumin: 3.8 g/dL (ref 3.8–4.9)
Alkaline Phosphatase: 65 [IU]/L (ref 44–121)
BUN/Creatinine Ratio: 13 (ref 9–23)
BUN: 11 mg/dL (ref 6–24)
Bilirubin Total: 0.4 mg/dL (ref 0.0–1.2)
CO2: 19 mmol/L — ABNORMAL LOW (ref 20–29)
Calcium: 8.8 mg/dL (ref 8.7–10.2)
Chloride: 107 mmol/L — ABNORMAL HIGH (ref 96–106)
Creatinine, Ser: 0.87 mg/dL (ref 0.57–1.00)
Globulin, Total: 2.5 g/dL (ref 1.5–4.5)
Glucose: 92 mg/dL (ref 70–99)
Potassium: 3.5 mmol/L (ref 3.5–5.2)
Sodium: 146 mmol/L — ABNORMAL HIGH (ref 134–144)
Total Protein: 6.3 g/dL (ref 6.0–8.5)
eGFR: 80 mL/min/{1.73_m2} (ref >59)

## 2023-08-23 LAB — CBC WITH DIFFERENTIAL/PLATELET
Basophils Absolute: 0.1 10*3/uL (ref 0.0–0.2)
Basos: 1 %
EOS (ABSOLUTE): 0.3 10*3/uL (ref 0.0–0.4)
Eos: 4 %
Hematocrit: 45.5 % (ref 34.0–46.6)
Hemoglobin: 14.1 g/dL (ref 11.1–15.9)
Immature Grans (Abs): 0 10*3/uL (ref 0.0–0.1)
Immature Granulocytes: 0 %
Lymphocytes Absolute: 1.9 10*3/uL (ref 0.7–3.1)
Lymphs: 25 %
MCH: 26.7 pg (ref 26.6–33.0)
MCHC: 31 g/dL — ABNORMAL LOW (ref 31.5–35.7)
MCV: 86 fL (ref 79–97)
Monocytes Absolute: 0.6 10*3/uL (ref 0.1–0.9)
Monocytes: 8 %
Neutrophils Absolute: 4.8 10*3/uL (ref 1.4–7.0)
Neutrophils: 62 %
Platelets: 302 10*3/uL (ref 150–450)
RBC: 5.29 x10E6/uL — ABNORMAL HIGH (ref 3.77–5.28)
RDW: 15.7 % — ABNORMAL HIGH (ref 11.7–15.4)
WBC: 7.6 10*3/uL (ref 3.4–10.8)

## 2023-08-23 LAB — LIPASE: Lipase: 44 U/L (ref 14–72)

## 2023-09-03 ENCOUNTER — Encounter (HOSPITAL_BASED_OUTPATIENT_CLINIC_OR_DEPARTMENT_OTHER): Payer: Self-pay | Admitting: Family Medicine

## 2023-09-05 ENCOUNTER — Ambulatory Visit: Payer: Medicaid Other | Attending: Cardiovascular Disease | Admitting: *Deleted

## 2023-09-05 DIAGNOSIS — I351 Nonrheumatic aortic (valve) insufficiency: Secondary | ICD-10-CM

## 2023-09-05 DIAGNOSIS — Z952 Presence of prosthetic heart valve: Secondary | ICD-10-CM | POA: Diagnosis not present

## 2023-09-05 DIAGNOSIS — Z7901 Long term (current) use of anticoagulants: Secondary | ICD-10-CM | POA: Diagnosis not present

## 2023-09-05 LAB — POCT INR: INR: 1.8 — AB (ref 2.0–3.0)

## 2023-09-05 NOTE — Patient Instructions (Signed)
Description   Continue taking Warfarin 1 tablet (5mg ) daily except 1/2 tablet (2.5mg ) on Mondays.  Keep the number of servings of green leafy vegetables consistent in your diet.  Recheck in 6 weeks.  [facility] [phone] call with medication changes and or bleeding.

## 2023-09-06 ENCOUNTER — Telehealth: Payer: Medicaid Other | Admitting: Family Medicine

## 2023-09-06 DIAGNOSIS — J4540 Moderate persistent asthma, uncomplicated: Secondary | ICD-10-CM | POA: Diagnosis not present

## 2023-09-06 MED ORDER — BENZONATATE 200 MG PO CAPS: 200.0000 mg | ORAL_CAPSULE | Freq: Two times a day (BID) | ORAL | 0 refills | Status: DC | PRN

## 2023-09-06 MED ORDER — PREDNISONE 20 MG PO TABS: 20.0000 mg | ORAL_TABLET | Freq: Two times a day (BID) | ORAL | 0 refills | Status: AC

## 2023-09-06 NOTE — Progress Notes (Signed)
E-Visit for Cough   We are sorry that you are not feeling well.  Here is how we plan to help!  Based on your presentation I believe you most likely have A cough due to a virus.  This is called viral bronchitis and is best treated by rest, plenty of fluids and control of the cough.  You may use Ibuprofen or Tylenol as directed to help your symptoms.     In addition you may use A prescription cough medication called Tessalon Perles 100mg . You may take 1-2 capsules every 8 hours as needed for your cough.  I will also send prednisone for your wheezing.   From your responses in the eVisit questionnaire you describe inflammation in the upper respiratory tract which is causing a significant cough.  This is commonly called Bronchitis and has four common causes:   Allergies Viral Infections Acid Reflux Bacterial Infection Allergies, viruses and acid reflux are treated by controlling symptoms or eliminating the cause. An example might be a cough caused by taking certain blood pressure medications. You stop the cough by changing the medication. Another example might be a cough caused by acid reflux. Controlling the reflux helps control the cough.  USE OF BRONCHODILATOR ("RESCUE") INHALERS: There is a risk from using your bronchodilator too frequently.  The risk is that over-reliance on a medication which only relaxes the muscles surrounding the breathing tubes can reduce the effectiveness of medications prescribed to reduce swelling and congestion of the tubes themselves.  Although you feel brief relief from the bronchodilator inhaler, your asthma may actually be worsening with the tubes becoming more swollen and filled with mucus.  This can delay other crucial treatments, such as oral steroid medications. If you need to use a bronchodilator inhaler daily, several times per day, you should discuss this with your provider.  There are probably better treatments that could be used to keep your asthma under  control.     HOME CARE Only take medications as instructed by your medical team. Complete the entire course of an antibiotic. Drink plenty of fluids and get plenty of rest. Avoid close contacts especially the very young and the elderly Cover your mouth if you cough or cough into your sleeve. Always remember to wash your hands A steam or ultrasonic humidifier can help congestion.   GET HELP RIGHT AWAY IF: You develop worsening fever. You become short of breath You cough up blood. Your symptoms persist after you have completed your treatment plan MAKE SURE YOU  Understand these instructions. Will watch your condition. Will get help right away if you are not doing well or get worse.    Thank you for choosing an e-visit.  Your e-visit answers were reviewed by a board certified advanced clinical practitioner to complete your personal care plan. Depending upon the condition, your plan could have included both over the counter or prescription medications.  Please review your pharmacy choice. Make sure the pharmacy is open so you can pick up prescription now. If there is a problem, you may contact your provider through [employer] and have the prescription routed to another pharmacy.  Your safety is important to [place]. If you have drug allergies check your prescription carefully.   For the next 24 hours you can use MyChart to ask questions about today's visit, request a non-urgent call back, or ask for a work or school excuse. You will get an email in the next two days asking about your experience. I hope that your  e-visit has been valuable and will speed your recovery.    have provided 5 minutes of non face to face time during this encounter for chart review and documentation.

## 2023-09-08 ENCOUNTER — Other Ambulatory Visit: Payer: Self-pay

## 2023-09-08 ENCOUNTER — Emergency Department
Admission: EM | Admit: 2023-09-08 | Discharge: 2023-09-09 | Disposition: A | Discharge status: Home / Self Care | Payer: Medicaid Other | Attending: Emergency Medicine | Admitting: Emergency Medicine

## 2023-09-08 ENCOUNTER — Emergency Department: Payer: Medicaid Other

## 2023-09-08 ENCOUNTER — Encounter: Payer: Self-pay | Admitting: Radiology

## 2023-09-08 DIAGNOSIS — Z20822 Contact with and (suspected) exposure to covid-19: Secondary | ICD-10-CM | POA: Diagnosis not present

## 2023-09-08 DIAGNOSIS — R059 Cough, unspecified: Secondary | ICD-10-CM | POA: Diagnosis present

## 2023-09-08 DIAGNOSIS — J069 Acute upper respiratory infection, unspecified: Secondary | ICD-10-CM | POA: Insufficient documentation

## 2023-09-08 DIAGNOSIS — H9203 Otalgia, bilateral: Secondary | ICD-10-CM | POA: Insufficient documentation

## 2023-09-08 LAB — BASIC METABOLIC PANEL
Anion gap: 12 (ref 5–15)
BUN: 12 mg/dL (ref 6–20)
CO2: 18 mmol/L — ABNORMAL LOW (ref 22–32)
Calcium: 8.3 mg/dL — ABNORMAL LOW (ref 8.9–10.3)
Chloride: 109 mmol/L (ref 98–111)
Creatinine, Ser: 0.78 mg/dL (ref 0.44–1.00)
GFR, Estimated: >60 mL/min (ref >60)
Glucose, Bld: 104 mg/dL — ABNORMAL HIGH (ref 70–99)
Potassium: 3.6 mmol/L (ref 3.5–5.1)
Sodium: 139 mmol/L (ref 135–145)

## 2023-09-08 LAB — CBC
HCT: 42 % (ref 36.0–46.0)
Hemoglobin: 13.4 g/dL (ref 12.0–15.0)
MCH: 27.2 pg (ref 26.0–34.0)
MCHC: 31.9 g/dL (ref 30.0–36.0)
MCV: 85.2 fL (ref 80.0–100.0)
Platelets: 265 10*3/uL (ref 150–400)
RBC: 4.93 MIL/uL (ref 3.87–5.11)
RDW: 15.5 % (ref 11.5–15.5)
WBC: 6.3 10*3/uL (ref 4.0–10.5)
nRBC: 0 % (ref 0.0–0.2)

## 2023-09-08 LAB — TROPONIN I (HIGH SENSITIVITY): Troponin I (High Sensitivity): 5 ng/L (ref <18)

## 2023-09-08 LAB — RESP PANEL BY RT-PCR (RSV, FLU A&B, COVID)  RVPGX2
Influenza A by PCR: NEGATIVE
Influenza B by PCR: NEGATIVE
Resp Syncytial Virus by PCR: NEGATIVE
SARS Coronavirus 2 by RT PCR: NEGATIVE

## 2023-09-08 MED ORDER — ALBUTEROL SULFATE HFA 108 (90 BASE) MCG/ACT IN AERS: 2.0000 | INHALATION_SPRAY | RESPIRATORY_TRACT | Status: DC | PRN

## 2023-09-08 NOTE — ED Notes (Signed)
Pt has a script for prednisone at the pharmacy that has not been picked up for a diagnosis of bronchitis on the 5th.

## 2023-09-08 NOTE — ED Triage Notes (Signed)
Shortness of breath started today. Otalgia bil started yesterday. Head started hurting this morning. Throat started hurting this morning Chest tightness started x6 hours ago after coughing.

## 2023-09-09 LAB — TROPONIN I (HIGH SENSITIVITY): Troponin I (High Sensitivity): 5 ng/L (ref <18)

## 2023-09-09 NOTE — Discharge Instructions (Addendum)
You have been seen in the Emergency Department (ED) today for a likely viral illness.  Please drink plenty of clear fluids (water, Gatorade, chicken broth, etc).  You may use Tylenol and/or Motrin according to label instructions.  You can alternate between the two without any side effects.  ° °Please follow up with your doctor as listed above. ° °Call your doctor or return to the Emergency Department (ED) if you are unable to tolerate fluids due to vomiting, have worsening trouble breathing, become extremely tired or difficult to awaken, or if you develop any other symptoms that concern you. ° °

## 2023-09-09 NOTE — ED Provider Notes (Signed)
[facility] Provider Note    Event Date/Time   First MD Initiated Contact with Patient 09/08/23 2324     (approximate)   History   Shortness of Breath and Chest Pain   HPI Mary Chambers is a 52 y.o. female who presents for evaluation of about 4 to 5 days of symptoms that include mild cough, sore throat, brief loss of voice and hoarseness, bilateral ear pain, and head congestion/stuffiness.  She had a telemedicine visit and was prescribed prednisone but has not yet started taking it.  She says she was having some wheezing earlier but that has completely resolved and she said she would never again take albuterol because it makes her jittery.  She was having some chest tightness that occurred after her cough began; the cough is relatively new over the last 24 hours or so whereas the other symptoms have been present for 4 to 5 days.  She said that she currently feels fine and would not have even come in if she had realized that she was prescribed a prednisone taper as opposed to an inhaled prednisone.     Physical Exam   Triage Vital Signs: ED Triage Vitals  Encounter Vitals Group     BP 09/08/23 2021 (!) 172/108     Systolic BP Percentile --      Diastolic BP Percentile --      Pulse Rate 09/08/23 2021 (!) 114     Resp 09/08/23 2021 16     Temp 09/08/23 2021 99.1 F (37.3 C)     Temp Source 09/09/23 0001 Oral     SpO2 09/08/23 2019 91 %     Weight 09/08/23 2019 122.5 kg (270 lb)     Height 09/08/23 2019 1.702 m (5\' 7" )     Head Circumference --      Peak Flow --      Pain Score 09/08/23 2343 0     Pain Loc --      Pain Education --      Exclude from Growth Chart --     Most recent vital signs: Vitals:   09/08/23 2021 09/09/23 0001  BP: (!) 172/108 (!) 186/98  Pulse: (!) 114 98  Resp: 16 18  Temp: 99.1 F (37.3 C) 98.5 F (36.9 C)  SpO2: 93% 95%    General: Awake, no distress.  Generally well-appearing. CV:  Good peripheral perfusion.   Initially tachycardic in triage but that resolved when she was in her room.  Normal heart sounds. Resp:  Normal effort. Speaking easily and comfortably, no accessory muscle usage nor intercostal retractions.  Lungs are clear to auscultation bilaterally with no wheezing, rales, nor rhonchi.  I observed a mild cough once during my assessment of the patient. Abd:  No distention.    ED Results / Procedures / Treatments   Labs (all labs ordered are listed, but only abnormal results are displayed) Labs Reviewed  BASIC METABOLIC PANEL - Abnormal; Notable for the following components:      Result Value   CO2 18 (*)    Glucose, Bld 104 (*)    Calcium 8.3 (*)    All other components within normal limits  RESP PANEL BY RT-PCR (RSV, FLU A&B, COVID)  RVPGX2  CBC  TROPONIN I (HIGH SENSITIVITY)  TROPONIN I (HIGH SENSITIVITY)     EKG  ED ECG REPORT I, Loleta Rose, the attending physician, personally viewed and interpreted this ECG.  Date: 09/08/2023 EKG Time: 20:  24 Rate: 108 Rhythm: Sinus tachycardia QRS Axis: normal Intervals: normal ST/T Wave abnormalities: Non-specific ST segment / T-wave changes, but no clear evidence of acute ischemia.  Specifically there is some ST segment depression and lead V2, but no other concerning or reciprocal changes Narrative Interpretation: no definitive evidence of acute ischemia; does not meet STEMI criteria.    RADIOLOGY I viewed and interpreted the patient's two-view chest x-ray and I see no evidence of pneumonia nor other acute abnormality.  I also read the radiologist's report, which confirmed no acute findings.   PROCEDURES:  Critical Care performed: No  Procedures    IMPRESSION / MDM / ASSESSMENT AND PLAN / ED COURSE  I reviewed the triage vital signs and the nursing notes.                              Differential diagnosis includes, but is not limited to, viral URI, pneumonia, obstructive airway exacerbation, less likely PE or  ACS.  Patient's presentation is most consistent with acute presentation with potential threat to life or bodily function.  Labs/studies ordered: High-sensitivity troponin x 2, respiratory viral panel, BMP, CBC, two-view chest x-ray, EKG  Interventions/Medications given:  Medications  albuterol (VENTOLIN HFA) 108 (90 Base) MCG/ACT inhaler 2 puff (2 puffs Inhalation Not Given 09/08/23 2033)    (Note:  hospital course my include additional interventions and/or labs/studies not listed above.)   Patient's blood pressure is elevated but otherwise normal vitals; initial tachycardia has resolved.  Lab work is all essentially normal with no concerning abnormalities, slightly low CO2 on BMP, otherwise normal.  Chest x-ray clear, EKG nonspecific and generally reassuring.  Symptoms are all very consistent with a respiratory viral illness.  Patient is in no distress and wants to go home.  Low risk for ACS and Wells Score for PE of zero.  Patient will follow-up as an outpatient and already has prescriptions from her telemedicine visit.  I gave my usual and customary follow-up recommendations and return precautions.         FINAL CLINICAL IMPRESSION(S) / ED DIAGNOSES   Final diagnoses:  Viral URI with cough     Rx / DC Orders   ED Discharge Orders     None        Note:  This document was prepared using Dragon voice recognition software and may include unintentional dictation errors.   Loleta Rose, MD 09/09/23 [phone]

## 2023-09-19 ENCOUNTER — Telehealth: Payer: Medicaid Other | Admitting: Family Medicine

## 2023-09-19 DIAGNOSIS — J208 Acute bronchitis due to other specified organisms: Secondary | ICD-10-CM | POA: Diagnosis not present

## 2023-09-19 DIAGNOSIS — B9689 Other specified bacterial agents as the cause of diseases classified elsewhere: Secondary | ICD-10-CM | POA: Diagnosis not present

## 2023-09-19 MED ORDER — AMOXICILLIN-POT CLAVULANATE 875-125 MG PO TABS: 1.0000 | ORAL_TABLET | Freq: Two times a day (BID) | ORAL | 0 refills | Status: AC

## 2023-09-19 NOTE — Progress Notes (Signed)
E-Visit for Cough   We are sorry that you are not feeling well.  Here is how we plan to help!  Based on your presentation I believe you most likely have A cough due to a virus.  This is called viral bronchitis and is best treated by rest, plenty of fluids and control of the cough.  You may use Ibuprofen or Tylenol as directed to help your symptoms.  Given your duration though, this could be a change to bacterial so we will start an Antibiotic for you, Augmentin twice daily for a week.   In addition you may use the tessalon perles you were given on 09/06/23. We will not repeat steroids at this time.   From your responses in the eVisit questionnaire you describe inflammation in the upper respiratory tract which is causing a significant cough.  This is commonly called Bronchitis and has four common causes:   Allergies Viral Infections Acid Reflux Bacterial Infection Allergies, viruses and acid reflux are treated by controlling symptoms or eliminating the cause. An example might be a cough caused by taking certain blood pressure medications. You stop the cough by changing the medication. Another example might be a cough caused by acid reflux. Controlling the reflux helps control the cough.  USE OF BRONCHODILATOR ("RESCUE") INHALERS: There is a risk from using your bronchodilator too frequently.  The risk is that over-reliance on a medication which only relaxes the muscles surrounding the breathing tubes can reduce the effectiveness of medications prescribed to reduce swelling and congestion of the tubes themselves.  Although you feel brief relief from the bronchodilator inhaler, your asthma may actually be worsening with the tubes becoming more swollen and filled with mucus.  This can delay other crucial treatments, such as oral steroid medications. If you need to use a bronchodilator inhaler daily, several times per day, you should discuss this with your provider.  There are probably better treatments  that could be used to keep your asthma under control.     HOME CARE Only take medications as instructed by your medical team. Complete the entire course of an antibiotic. Drink plenty of fluids and get plenty of rest. Avoid close contacts especially the very young and the elderly Cover your mouth if you cough or cough into your sleeve. Always remember to wash your hands A steam or ultrasonic humidifier can help congestion.   GET HELP RIGHT AWAY IF: You develop worsening fever. You become short of breath You cough up blood. Your symptoms persist after you have completed your treatment plan MAKE SURE YOU  Understand these instructions. Will watch your condition. Will get help right away if you are not doing well or get worse.    Thank you for choosing an e-visit.  Your e-visit answers were reviewed by a board certified advanced clinical practitioner to complete your personal care plan. Depending upon the condition, your plan could have included both over the counter or prescription medications.  Please review your pharmacy choice. Make sure the pharmacy is open so you can pick up prescription now. If there is a problem, you may contact your provider through [employer] and have the prescription routed to another pharmacy.  Your safety is important to [place]. If you have drug allergies check your prescription carefully.   For the next 24 hours you can use MyChart to ask questions about today's visit, request a non-urgent call back, or ask for a work or school excuse. You will get an email in the next  two days asking about your experience. I hope that your e-visit has been valuable and will speed your recovery.  I provided 5 minutes of non face-to-face time during this encounter for chart review, medication and order placement, as well as and documentation.

## 2023-10-17 ENCOUNTER — Ambulatory Visit (HOSPITAL_BASED_OUTPATIENT_CLINIC_OR_DEPARTMENT_OTHER): Payer: Medicaid Other | Admitting: Cardiology

## 2023-10-17 ENCOUNTER — Ambulatory Visit: Payer: Medicaid Other | Attending: Cardiology

## 2023-10-17 ENCOUNTER — Encounter (HOSPITAL_BASED_OUTPATIENT_CLINIC_OR_DEPARTMENT_OTHER): Payer: Self-pay | Admitting: Cardiology

## 2023-10-17 VITALS — BP 120/70 | HR 95 | Ht 67.0 in | Wt 265.0 lb

## 2023-10-17 DIAGNOSIS — R052 Subacute cough: Secondary | ICD-10-CM | POA: Diagnosis not present

## 2023-10-17 DIAGNOSIS — I351 Nonrheumatic aortic (valve) insufficiency: Secondary | ICD-10-CM

## 2023-10-17 DIAGNOSIS — Z952 Presence of prosthetic heart valve: Secondary | ICD-10-CM

## 2023-10-17 DIAGNOSIS — I10 Essential (primary) hypertension: Secondary | ICD-10-CM

## 2023-10-17 DIAGNOSIS — Z7901 Long term (current) use of anticoagulants: Secondary | ICD-10-CM

## 2023-10-17 DIAGNOSIS — Z6841 Body Mass Index (BMI) 40.0 and over, adult: Secondary | ICD-10-CM

## 2023-10-17 DIAGNOSIS — E66813 Obesity, class 3: Secondary | ICD-10-CM

## 2023-10-17 DIAGNOSIS — G4733 Obstructive sleep apnea (adult) (pediatric): Secondary | ICD-10-CM | POA: Diagnosis not present

## 2023-10-17 DIAGNOSIS — Z8679 Personal history of other diseases of the circulatory system: Secondary | ICD-10-CM

## 2023-10-17 LAB — POCT INR: INR: 3.5 — AB (ref 2.0–3.0)

## 2023-10-17 NOTE — Patient Instructions (Addendum)
 Medication Instructions:  Your physician recommends that you continue on your current medications as directed. Please refer to the Current Medication list given to you today.  *If you need a refill on your cardiac medications before your next appointment, please call your pharmacy*   Lab Work: None ordered.  Testing/Procedures: Your physician recommends an at home sleep study Engineer, water)   Follow-Up: At [facility], you and your health needs are our priority.  As part of our continuing mission to provide you with exceptional heart care, we have created designated Provider Care Teams.  These Care Teams include your primary Cardiologist (physician) and Advanced Practice Providers (APPs -  Physician Assistants and Nurse Practitioners) who all work together to provide you with the care you need, when you need it.  We recommend signing up for the patient portal called "MyChart".  Sign up information is provided on this After Visit Summary.  MyChart is used to connect with patients for Virtual Visits (Telemedicine).  Patients are able to view lab/test results, encounter notes, upcoming appointments, etc.  Non-urgent messages can be sent to your provider as well.   To learn more about what you can do with MyChart, go to [email or web address].    Your next appointment:   6 month(s)  Provider:   Sheryle Donning, MD    Other Instructions       WatchPAT?  Is a FDA cleared portable home sleep study test that uses a watch and 3 points of contact to monitor 7 different channels, including your heart rate, oxygen saturations, body position, snoring, and chest motion.  The study is easy to use from the comfort of your own home and accurately detect sleep apnea.  Before bed, you attach the chest sensor, attached the sleep apnea bracelet to your nondominant hand, and attach the finger probe.  After the study, the raw data is downloaded from the watch and scored for apnea events.   For  more information: [email or web address]  Patient Testing Instructions:  Do not put battery into the device until bedtime when you are ready to begin the test. Please call the support number if you need assistance after following the instructions below: 24 hour support line- [phone] or [facility] support at [phone] (option 2)  Download the IntelWatchPAT One" app through the google play store or App Store  Be sure to turn on or enable access to bluetooth in settlings on your smartphone/ device  Make sure no other bluetooth devices are on and within the vicinity of your smartphone/ device and WatchPAT watch during testing.  Make sure to leave your smart phone/ device plugged in and charging all night.  When ready for bed:  Follow the instructions step by step in the WatchPAT One App to activate the testing device. For additional instructions, including video instruction, visit the WatchPAT One video on Youtube. You can search for WatchPat One within Youtube (video is 4 minutes and 18 seconds) or enter: https://youtube/watch?[email or web address]_vbiwxE Please note: You will be prompted to enter a Pin to connect via bluetooth when starting the test. The PIN will be assigned to you when you receive the test.  The device is disposable, but it recommended that you retain the device until you receive a call letting you know the study has been received and the results have been interpreted.  We will let you know if the study did not transmit to us  properly after the test is completed. You do not need to call us  to confirm the receipt  of the test.  Please complete the test within 48 hours of receiving PIN.   Frequently Asked Questions:  What is Watch Deatra Face one?  A single use fully disposable home sleep apnea testing device and will not need to be returned after completion.  What are the requirements to use WatchPAT one?  The be able to have a successful watchpat one sleep study, you should have  your Watch pat one device, your smart phone, watch pat one app, your PIN number and Internet access What type of phone do I need?  You should have a smart phone that uses Android 5.1 and above or any Iphone with IOS 10 and above How can I download the WatchPAT one app?  Based on your device type search for WatchPAT one app either in google play for android devices or APP store for Iphone's Where will I get my PIN for the study?  Your PIN will be provided by your physician's office. It is used for authentication and if you lose/forget your PIN, please reach out to your providers office.  I do not have Internet at home. Can I do WatchPAT one study?  WatchPAT One needs Internet connection throughout the night to be able to transmit the sleep data. You can use your home/local internet or your cellular's data package. However, it is always recommended to use home/local Internet. It is estimated that between 20MB-30MB will be used with each study.However, the application will be looking for space in the phone to start the study.  What happens if I lose internet or bluetooth connection?  During the internet disconnection, your phone will not be able to transmit the sleep data. All the data, will be stored in your phone. As soon as the internet connection is back on, the phone will being sending the sleep data. During the bluetooth disconnection, WatchPAT one will not be able to to send the sleep data to your phone. Data will be kept in the WatchPAT one until two devices have bluetooth connection back on. As soon as the connection is back on, WatchPAT one will send the sleep data to the phone.  How long do I need to wear the WatchPAT one?  After you start the study, you should wear the device at least 6 hours.  How far should I keep my phone from the device?  During the night, your phone should be within 15 feet.  What happens if I leave the room for restroom or other reasons?  Leaving the room for any  reason will not cause any problem. As soon as your get back to the room, both devices will reconnect and will continue to send the sleep data. Can I use my phone during the sleep study?  Yes, you can use your phone as usual during the study. But it is recommended to put your watchpat one on when you are ready to go to bed.  How will I get my study results?  A soon as you completed your study, your sleep data will be sent to the provider. They will then share the results with you when they are ready.

## 2023-10-17 NOTE — Patient Instructions (Signed)
 Description   HOLD tomorrow's dose and only take 1/2 tablet on Friday and then continue taking Warfarin 1 tablet (5mg ) daily except 1/2 tablet (2.5mg ) on Mondays.  Keep the number of servings of green leafy vegetables consistent in your diet.  Recheck in 2 weeks.  Coumadin  Clinic [phone] call with medication changes and or bleeding.

## 2023-10-17 NOTE — Progress Notes (Signed)
 Cardiology Office Note:  .   Date:  10/17/2023  ID:  Mary Chambers, DOB September 21, 1971, MRN [id number] PCP: de [place], Raymond J, MD  [facility] Providers Cardiologist:  Sheryle Donning, MD {  History of Present Illness: .   Mary Chambers is a 53 y.o. female with a hx of aortic valve endocarditis s/p AVR 2023, hypertension, obesity, arthritis, and depression, who is seen for follow-up   Today: Here for follow up of GLP initiation from 06/2023. Felt very bad when she tried to go up to the 0.5 mg dose. Was very nauseated, vomited up any meat she tried to eat for about the first week or two after the second dose of the 0.5 mg dose. She thought maybe she had food poisoning. Took the remaining two weekly doses of Wegovy , no further vomiting but still with nausea and pain with bowel movements; noted bright red blood with some bowel movements. She denies diarrhea, actually notes that the stools were hard. Tried to wait for it to get better, but she saw Dr. Lurene Salm on 08/22/23 and was told to stop Wegovy , which she did. Symptoms improved about a week later, though she is still having pain when she has a bowel movement.   ROS for intermittent hoarseness, cough and mucus for the last month or more. No fevers, doesn't keep her awake at night. Roommate has been sick recently but she's had symptoms longer. No history of allergies in the winter. Was sick in the beginning of December with severe symptoms and shortness of breath with wheezing, but cough has lingered since then. Has been using mucinex , flonase  without significant change.  Boyfriend is concerned she has sleep apnea, as he saw her stop breathing. She was on CPAP in the past through [facility], hasn't had since about 2019.  ROS: Denies chest pain, shortness of breath at rest or with normal exertion. No PND, orthopnea, LE edema or unexpected weight gain. No syncope or palpitations. ROS otherwise negative except as noted.   Studies  Reviewed: Aaron Aas    EKG:       Physical Exam:   VS:  BP 120/70 (BP Location: Left Arm, Patient Position: Sitting, Cuff Size: Normal)   Pulse 95   Ht 5\' 7"  (1.702 m)   Wt 265 lb (120.2 kg)   SpO2 96%   BMI 41.50 kg/m    Wt Readings from Last 3 Encounters:  10/17/23 265 lb (120.2 kg)  09/08/23 270 lb (122.5 kg)  08/22/23 274 lb 9.6 oz (124.6 kg)    GEN: Well nourished, well developed in no acute distress HEENT: Normal, moist mucous membranes NECK: No JVD CARDIAC: regular rhythm, normal S1 and S2, no rubs or gallops. No murmur. VASCULAR: Radial and DP pulses 2+ bilaterally. No carotid bruits RESPIRATORY:  Clear to auscultation without rales, wheezing or rhonchi  ABDOMEN: Soft, non-tender, non-distended MUSCULOSKELETAL:  normal bulk and tone SKIN: Warm and dry, no edema NEUROLOGIC:  Alert and oriented x 3. No focal neuro deficits noted. PSYCHIATRIC:  Normal affect    ASSESSMENT AND PLAN: .    Post-URI cough -slow to improve, but no red flag features. She will monitor and contact PCP if symptoms worsen   Aortic valve endocarditis Severe aortic regurgitation Bicuspid aortic valve S/P AVR with 23mm On-X valve 11/07/21, Dr. Deloise Ferries -on aspirin , coumadin  -has amoxicillin  for dental prophylaxis   Hypertension LE edema -continue low dose amlodipine  -only needs rare lasix , none recently   Morbid obesity BMI 43->41 -see evaluation  for GLP in note 06/28/23. Weight at that visit 262 lb -unfortunately did not tolerate 0.5 mg semaglutide  due to vomiting, nausea, and abdominal pain with hard stools. She thought this was food poisoning, but given the duration and the fact that the symptoms resolved with stopping Wegovy , would not retrial.  OSA -stop-bang=7 -was on CPAP >5 years ago  CV risk counseling and prevention -recommend heart healthy/Mediterranean diet, with whole grains, fruits, vegetable, fish, lean meats, nuts, and olive oil. Limit salt. -recommend moderate walking, 3-5  times/week for 30-50 minutes each session. Aim for at least 150 minutes.week. Goal should be pace of 3 miles/hours, or walking 1.5 miles in 30 minutes -recommend avoidance of tobacco products. Avoid excess alcohol. -ASCVD risk score: The 10-year ASCVD risk score (Arnett DK, et al., 2019) is: 1.9%   Values used to calculate the score:     Age: 57 years     Sex: Female     Is Non-Hispanic African American: No     Diabetic: No     Tobacco smoker: No     Systolic Blood Pressure: 120 mmHg     Is BP treated: Yes     HDL Cholesterol: 54 mg/dL     Total Cholesterol: 192 mg/dL    Dispo: 6 months or sooner as needed  Signed, Sheryle Donning, MD   Sheryle Donning, MD, PhD, [facility]  [facility]  [place]  Heart & Vascular at [facility] at [facility] [address], [address] [place], [place] [address] [phone]

## 2023-10-18 ENCOUNTER — Telehealth (HOSPITAL_BASED_OUTPATIENT_CLINIC_OR_DEPARTMENT_OTHER): Payer: Self-pay | Admitting: Licensed Clinical Social Worker

## 2023-10-18 NOTE — Progress Notes (Signed)
Heart and Vascular Care Navigation  10/18/2023  Mary Chambers 15-Mar-1971 [id number]  Reason for Referral: housing challenges, financial challenges   Engaged with patient by telephone for initial visit for Heart and Vascular Care Coordination.                                                                                                   Assessment:              LCSW was able to reach pt this morning at [phone]. Introduced self, role, reason for call. Pt confirmed home address and PCP. Is not employed, receives disability income, resides with a roommate. She shares that she pays $250 to stay with friend and they handle utilities. Her concerns mostly are around cleanliness of the home, she shares there are issues with pests and other cleanliness issues. She feels they have affected her health. She has spoken with her roommate and they do not feel they are issues. She has been speaking with her pastor and friends to see if any availability for housing in the area. She doesn't want to spend more than $700. Discussed housing shortage in central [place] for affordable units, is willing to try and find a roommate if needed. I will send options from [employer] and local affordable housing list.   Pt denied any issues obtaining medications at this time. No threats of eviction or utility concerns. She has rides from friends as needed and family. She also is aware of transportation through her insurance plan to medical appts if needed. She is concerned as her food stamp benefit was cut more than they had initially shared with her it would be. I will send [employer] number for her to speak with their workers.   Otherwise no additional questions when prompted. Encouraged her to call me as needed with any additional questions that may arise.                         HRT/VAS Care Coordination     Patients Home Cardiology Office [facility]    Outpatient Care Team Social Worker   Social Worker Name: Octavio Graves, [place], [phone]   Living arrangements for the past 2 months Single Family Home   Lives with: Roommate   Patient Current Insurance Coverage Medicaid   Patient Has Concern With Paying Medical Bills No   Does Patient Have Prescription Coverage? Yes   Home Assistive Devices/Equipment Cane (specify quad or straight); Dentures (specify type); Eyeglasses   DME Agency [employer]   [facility] Agency [facility]   Current home services DME       Social History:  SDOH Screenings   Food Insecurity: Food Insecurity Present (10/18/2023)  Housing: Low Risk  (10/18/2023)  Recent Concern: Housing - High Risk (08/19/2023)  Transportation Needs: No Transportation Needs (10/18/2023)  Recent Concern: Transportation Needs - Unmet Transportation Needs (08/19/2023)  Utilities: Not At Risk (03/22/2023)  Alcohol Screen: Low Risk  (07/02/2023)  Depression (PHQ2-9): High Risk (08/22/2023)  Financial Resource Strain: Medium Risk (10/18/2023)  Physical Activity: Inactive (08/19/2023)  Social Connections: Socially Isolated (08/19/2023)  Stress: Stress Concern Present (08/19/2023)  Tobacco Use: Low Risk  (10/17/2023)  Health Literacy: Adequate Health Literacy (10/18/2023)    SDOH Interventions: Financial Resources:  Surveyor, quantity Strain Interventions: [employer] Provided (limited budget- will send food, community resources, housing options from [employer]org) DSS for financial assistance  Food Insecurity:  Food Insecurity Interventions: Other (Comment) (receives SNAP, was reduced more than stated- will mail [employer] card)  Housing Insecurity:  Housing Interventions: Other (Comment) (desires to move- doesnt feel her environment is clean and is making her sick- will send housing resources [place]  Transportation:   Transportation Interventions: Intervention Not Indicated, Patient Resources (Friends/Family), Payor Benefit     Other Care Navigation Interventions:     Provided Pharmacy assistance resources  Pt denies any issues at this time obtaining medications. We discussed using [facility] for delivery if needed   Follow-up plan:   LCSW will send pt the following resources: my card, Food and Nutrition assistance through [employer], housing lists, [employer] as it has many additional resources, and [place] assistance through [employer]. Will f/u to ensure resources have arrived and provide any additional support needed by pt.

## 2023-10-22 ENCOUNTER — Encounter (HOSPITAL_BASED_OUTPATIENT_CLINIC_OR_DEPARTMENT_OTHER): Payer: Self-pay

## 2023-10-22 ENCOUNTER — Telehealth: Payer: Medicaid Other | Admitting: Physician Assistant

## 2023-10-22 DIAGNOSIS — J3489 Other specified disorders of nose and nasal sinuses: Secondary | ICD-10-CM

## 2023-10-22 DIAGNOSIS — R052 Subacute cough: Secondary | ICD-10-CM

## 2023-10-22 MED ORDER — BENZONATATE 100 MG PO CAPS: 100.0000 mg | ORAL_CAPSULE | Freq: Three times a day (TID) | ORAL | 0 refills | Status: DC | PRN

## 2023-10-22 MED ORDER — IPRATROPIUM BROMIDE 0.03 % NA SOLN: 2.0000 | Freq: Two times a day (BID) | NASAL | 0 refills | Status: DC

## 2023-10-22 NOTE — Progress Notes (Signed)
E-Visit for Cough   We are sorry that you are not feeling well.  Here is how we plan to help!  Based on your presentation I believe you most likely have A cough due to a virus.  This is called viral bronchitis and is best treated by rest, plenty of fluids and control of the cough.  You may use Ibuprofen or Tylenol as directed to help your symptoms.   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 This is a chronic cough after a moderate upper respiratory infection that is caused from residual inflammation in the airway from the previous infection. Symptoms of a cough can last for 6-8 weeks following a significant upper respiratory infection. Some can even last up to 3 months.    In addition you may use A non-prescription cough medication called Mucinex DM: take 2 tablets every 12 hours. and A prescription cough medication called Tessalon Perles 100mg . You may take 1-2 capsules every 8 hours as needed for your cough.  I have also prescribed Ipratropium Bromide nasal spray Use 2 sprays in each nostril every 12 hours as needed for the nasal drainage.  From your responses in the eVisit questionnaire you describe inflammation in the upper respiratory tract which is causing a significant cough.  This is commonly called Bronchitis and has four common causes:   Allergies Viral Infections Acid Reflux Bacterial Infection Allergies, viruses and acid reflux are treated by controlling symptoms or eliminating the cause. An example might be a cough caused by taking certain blood pressure medications. You stop the cough by changing the medication. Another example might be a cough caused by acid reflux. Controlling the reflux helps control the cough.  USE OF BRONCHODILATOR ("RESCUE") INHALERS: There is a risk from using your bronchodilator too frequently.  The risk is that over-reliance on a medication which only relaxes the muscles surrounding the breathing tubes can reduce  the effectiveness of medications prescribed to reduce swelling and congestion of the tubes themselves.  Although you feel brief relief from the bronchodilator inhaler, your asthma may actually be worsening with the tubes becoming more swollen and filled with mucus.  This can delay other crucial treatments, such as oral steroid medications. If you need to use a bronchodilator inhaler daily, several times per day, you should discuss this with your provider.  There are probably better treatments that could be used to keep your asthma under control.     HOME CARE Only take medications as instructed by your medical team. Complete the entire course of an antibiotic. Drink plenty of fluids and get plenty of rest. Avoid close contacts especially the very young and the elderly Cover your mouth if you cough or cough into your sleeve. Always remember to wash your hands A steam or ultrasonic humidifier can help congestion.   GET HELP RIGHT AWAY IF: You develop worsening fever. You become short of breath You cough up blood. Your symptoms persist after you have completed your treatment plan MAKE SURE YOU  Understand these instructions. Will watch your condition. Will get help right away if you are not doing well or get worse.    Thank you for choosing an e-visit.  Your e-visit answers were reviewed by a board certified advanced clinical practitioner to complete your personal care plan. Depending upon the condition, your plan could have included both over the counter or prescription medications.  Please review your pharmacy choice. Make sure the pharmacy is open so you can pick  up prescription now. If there is a problem, you may contact your provider through [employer] and have the prescription routed to another pharmacy.  Your safety is important to [place]. If you have drug allergies check your prescription carefully.   For the next 24 hours you can use MyChart to ask questions about today's visit,  request a non-urgent call back, or ask for a work or school excuse. You will get an email in the next two days asking about your experience. I hope that your e-visit has been valuable and will speed your recovery.   I have spent 5 minutes in review of e-visit questionnaire, review and updating patient chart, medical decision making and response to patient.   Margaretann Loveless, PA-C

## 2023-10-26 ENCOUNTER — Emergency Department: Payer: Medicaid Other

## 2023-10-26 ENCOUNTER — Encounter (HOSPITAL_BASED_OUTPATIENT_CLINIC_OR_DEPARTMENT_OTHER): Payer: Self-pay | Admitting: Family Medicine

## 2023-10-26 ENCOUNTER — Emergency Department
Admission: EM | Admit: 2023-10-26 | Discharge: 2023-10-27 | Disposition: A | Discharge status: Home / Self Care | Payer: Medicaid Other | Attending: Emergency Medicine | Admitting: Emergency Medicine

## 2023-10-26 ENCOUNTER — Other Ambulatory Visit: Payer: Self-pay

## 2023-10-26 ENCOUNTER — Telehealth (HOSPITAL_BASED_OUTPATIENT_CLINIC_OR_DEPARTMENT_OTHER): Payer: Self-pay | Admitting: Licensed Clinical Social Worker

## 2023-10-26 DIAGNOSIS — S161XXA Strain of muscle, fascia and tendon at neck level, initial encounter: Secondary | ICD-10-CM | POA: Insufficient documentation

## 2023-10-26 DIAGNOSIS — W19XXXA Unspecified fall, initial encounter: Secondary | ICD-10-CM | POA: Insufficient documentation

## 2023-10-26 DIAGNOSIS — Y92019 Unspecified place in single-family (private) house as the place of occurrence of the external cause: Secondary | ICD-10-CM | POA: Insufficient documentation

## 2023-10-26 DIAGNOSIS — S0990XA Unspecified injury of head, initial encounter: Secondary | ICD-10-CM | POA: Diagnosis not present

## 2023-10-26 DIAGNOSIS — S39012A Strain of muscle, fascia and tendon of lower back, initial encounter: Secondary | ICD-10-CM | POA: Diagnosis not present

## 2023-10-26 DIAGNOSIS — Z7901 Long term (current) use of anticoagulants: Secondary | ICD-10-CM | POA: Insufficient documentation

## 2023-10-26 DIAGNOSIS — S3992XA Unspecified injury of lower back, initial encounter: Secondary | ICD-10-CM | POA: Diagnosis present

## 2023-10-26 MED ORDER — NAPROXEN 500 MG PO TBEC: 500.0000 mg | DELAYED_RELEASE_TABLET | Freq: Two times a day (BID) | ORAL | 2 refills | Status: DC

## 2023-10-26 MED ORDER — TIZANIDINE HCL 4 MG PO TABS: 4.0000 mg | ORAL_TABLET | Freq: Three times a day (TID) | ORAL | 0 refills | Status: AC

## 2023-10-26 MED ORDER — KETOROLAC TROMETHAMINE 30 MG/ML IJ SOLN
60.0000 mg | Freq: Once | INTRAMUSCULAR | Status: AC
  Administered 2023-10-26: 60 mg via INTRAMUSCULAR
  Filled 2023-10-26: qty 2

## 2023-10-26 NOTE — Telephone Encounter (Signed)
H&V Care Navigation CSW Progress Note  Clinical Social Worker  received additional call from pt  to inquire about who could complete referral for a wheelchair for her. I note she has messaged PCP office and she shares she has an appt with neurosurgery next week. Encouraged her to f/u with those providers as they would be more appropriate to complete that referral. If any issues let her know to reach back out to me.  Patient is participating in a Managed Medicaid Plan:  Yes- [employer]  SDOH Screenings   Food Insecurity: Food Insecurity Present (10/18/2023)  Housing: Low Risk  (10/18/2023)  Recent Concern: Housing - High Risk (08/19/2023)  Transportation Needs: No Transportation Needs (10/18/2023)  Recent Concern: Transportation Needs - Unmet Transportation Needs (08/19/2023)  Utilities: Not At Risk (03/22/2023)  Alcohol Screen: Low Risk  (07/02/2023)  Depression (PHQ2-9): High Risk (08/22/2023)  Financial Resource Strain: Medium Risk (10/18/2023)  Physical Activity: Inactive (08/19/2023)  Social Connections: Socially Isolated (08/19/2023)  Stress: Stress Concern Present (08/19/2023)  Tobacco Use: Low Risk  (10/17/2023)  Health Literacy: Adequate Health Literacy (10/18/2023)    Octavio Graves, MSW, LCSW Clinical Social Worker II [facility] Heart/Vascular Care Navigation  [phone]- work cell phone (preferred) [phone]- desk phone

## 2023-10-26 NOTE — Telephone Encounter (Addendum)
H&V Care Navigation CSW Progress Note  Clinical Social Worker contacted patient by phone to f/u on community resources sent regarding housing. No answer this morning at [phone], left voicemail. Will re-attempt again as able.  Pt returned my call, she shares she received resources, has been calling around and was able to reach a few new housing options. One of them unfortunately can only offer a second floor apartment which she cannot climb stairs comfortably enough to accept. She also has been in contact with vocational rehab.   Still hasn't heard back from [facility] caseworker regarding discrepency with her benefits, encouraged her to call [employer] food and nutrition services workers to see if they may be able to assist. Pt will do so.   Feels cough isnt getting better, was seen and give some recommendations but she doesn't feel they have helped. Has another virtual visit scheduled to try and address further. Her partner has had Flu A and her roommate has had bronchitis, so she feels other treatment may be needed.   Patient is participating in a Managed Medicaid Plan:  [facility]   SDOH Screenings   Food Insecurity: Food Insecurity Present (10/18/2023)  Housing: Low Risk  (10/18/2023)  Recent Concern: Housing - High Risk (08/19/2023)  Transportation Needs: No Transportation Needs (10/18/2023)  Recent Concern: Transportation Needs - Unmet Transportation Needs (08/19/2023)  Utilities: Not At Risk (03/22/2023)  Alcohol Screen: Low Risk  (07/02/2023)  Depression (PHQ2-9): High Risk (08/22/2023)  Financial Resource Strain: Medium Risk (10/18/2023)  Physical Activity: Inactive (08/19/2023)  Social Connections: Socially Isolated (08/19/2023)  Stress: Stress Concern Present (08/19/2023)  Tobacco Use: Low Risk  (10/17/2023)  Health Literacy: Adequate Health Literacy (10/18/2023)    Octavio Graves, MSW, LCSW Clinical Social Worker II [facility] Health Heart/Vascular Care Navigation   [phone]- work cell phone (preferred) [phone]- desk phone

## 2023-10-26 NOTE — ED Triage Notes (Addendum)
Pt to ED via [facility] from home. Pt reports tripped over her feet and fell backwards striking posterior head on stove. Pt denies LOC. Pt reports dizziness and lower back pain after fall. Pt on warfarin for heart valve replacement. Pt denies CP, N/V or SOB.

## 2023-10-26 NOTE — ED Provider Notes (Signed)
[facility] Provider Note    Event Date/Time   First MD Initiated Contact with Patient 10/26/23 2156     (approximate)   History   Fall   HPI  Mary Chambers is a 53 y.o. female patient presents today after falling at her home she fell backwards hitting her head and her lower back.  Patient is taking warfarin.  Patient denies loss of consciousness, nauseous vomiting, cephalgia.     Physical Exam   Triage Vital Signs: ED Triage Vitals  Encounter Vitals Group     BP 10/26/23 1833 (!) 157/101     Systolic BP Percentile --      Diastolic BP Percentile --      Pulse Rate 10/26/23 1833 (!) 104     Resp 10/26/23 1833 20     Temp 10/26/23 1833 98.6 F (37 C)     Temp Source 10/26/23 1833 Oral     SpO2 10/26/23 1833 95 %     Weight --      Height --      Head Circumference --      Peak Flow --      Pain Score 10/26/23 1834 7     Pain Loc --      Pain Education --      Exclude from Growth Chart --     Most recent vital signs: Vitals:   10/26/23 1833 10/26/23 2231  BP: (!) 157/101 (!) 139/99  Pulse: (!) 104 99  Resp: 20 18  Temp: 98.6 F (37 C)   SpO2: 95% 99%     Constitutional: Alert non distressed Eyes: Conjunctivae are normal.  Head: Atraumatic. Nose: No congestion/rhinnorhea. Mouth/Throat: Mucous membranes are moist.   Neck: Painless ROM.  Tender to palpation Cardiovascular:   Good peripheral circulation. Respiratory: Normal respiratory effort.  No retractions.  Gastrointestinal: Soft and nontender.  Musculoskeletal:  no deformit lumbar area: Presence of surgical scar.  Skin intact no hematomas no ecchymosis.  Tender to palpation at the level of L1-L2.  Neurologic:  MAE spontaneously. No gross focal neurologic deficits are appreciated.  Skin:  Skin is warm, dry and intact. No rash noted. Psychiatric: Mood and affect are normal. Speech and behavior are normal.    ED Results / Procedures / Treatments   Labs (all labs  ordered are listed, but only abnormal results are displayed) Labs Reviewed - No data to display   EKG     RADIOLOGY I independently reviewed and interpreted imaging and agree with radiologists findings.      PROCEDURES:  Critical Care performed:   Procedures   MEDICATIONS ORDERED IN ED: Medications  ketorolac (TORADOL) 30 MG/ML injection 60 mg (has no administration in time range)     IMPRESSION / MDM / ASSESSMENT AND PLAN / ED COURSE  I reviewed the triage vital signs and the nursing notes.  Differential diagnosis includes, but is not limited to, lumbar spine fracture, epidural hematoma, cervical spine fracture.  Patient's presentation is most consistent with acute complicated illness / injury requiring diagnostic workup.   Patient's diagnosis is consistent with fall, lumbar spine muscle strain, strain of cervical muscle.. I independently reviewed and interpreted imaging and agree with radiologists findings. I did review the patient's allergies and medications. Patient will be discharged home with prescriptions for tizanidine, naproxen patient is to follow up with pain management physician as needed or otherwise directed. Patient is given ED precautions to return to the ED for any worsening or  new symptoms. Discussed plan of care with patient, answered all of patient's questions, Patient agreeable to plan of care. Advised patient to take medications according to the instructions on the label. Discussed possible side effects of new medications. Patient verbalized understanding. Clinical Course as of 10/26/23 2335  Fri Oct 26, 2023  2236 DG Lumbar Spine Complete No acute fracture or subluxation of the lumbar spine. 2. Unchanged L1-L2 posterior fusion hardware with fracture of the   [AE]  2237 CT Head Wo Contrast Negative non contrasted CT appearance of the brain [AE]  2237 CT Cervical Spine Wo Contrast [AE]  2237  Straightening of the cervical spine with degenerative  and postsurgical changes. No acute osseous abnormality.   [AE]    Clinical Course User Index [AE] Gladys Damme, PA-C     FINAL CLINICAL IMPRESSION(S) / ED DIAGNOSES   Final diagnoses:  Fall, initial encounter  Strain of neck muscle, initial encounter  Strain of lumbar paraspinal muscle, initial encounter     Rx / DC Orders   ED Discharge Orders          Ordered    tiZANidine (ZANAFLEX) 4 MG tablet  3 times daily        10/26/23 2329    naproxen (EC NAPROSYN) 500 MG EC tablet  2 times daily with meals        10/26/23 2329             Note:  This document was prepared using Dragon voice recognition software and may include unintentional dictation errors.   Gladys Damme, PA-C 10/26/23 2336    Jene Every, MD 10/29/23 1536

## 2023-10-26 NOTE — ED Provider Triage Note (Signed)
Emergency Medicine Provider Triage Evaluation Note  Mary Chambers , a 53 y.o. female  was evaluated in triage.  Pt complains of fall, patient states she slipped causing her to fall backwards and hit her head on the oven door. She does take warfarin. Also has lower back pain. She states she is also feeling dizzy which began after the fall. No LOC, no vomiting.  Review of Systems  Positive: Headache, dizziness Negative: Vomiting, blurry vision  Physical Exam  There were no vitals taken for this visit. Gen:   Awake, no distress   Resp:  Normal effort  MSK:   Moves extremities without difficulty  Other:    Medical Decision Making  Medically screening exam initiated at 6:33 PM.  Appropriate orders placed.  Mary Chambers was informed that the remainder of the evaluation will be completed by another provider, this initial triage assessment does not replace that evaluation, and the importance of remaining in the ED until their evaluation is complete.     Cameron Ali, PA-C 10/26/23 1835

## 2023-10-26 NOTE — Discharge Instructions (Addendum)
You have been diagnosed with lumbar strain, cervical strain.  Please take medications as directed.  Naproxen is for pain, tizanidine  muscular relaxant.  Please go to your pain management  doctor.  Please come back of go to your PCP if you have new symptoms or symptoms worsen.

## 2023-10-29 ENCOUNTER — Telehealth: Payer: Self-pay

## 2023-10-29 NOTE — Telephone Encounter (Signed)
I spoke to patient who started Prednisone 1/25 and I told her to Hold Today's dose.

## 2023-10-30 ENCOUNTER — Telehealth: Payer: Medicaid Other | Admitting: Physician Assistant

## 2023-10-30 DIAGNOSIS — N760 Acute vaginitis: Secondary | ICD-10-CM

## 2023-10-30 NOTE — Progress Notes (Signed)
  Because of yellow/green discharge along with fever which is not common with yeast or bacterial vaginosis and increased need for examination and swabbing, I feel your condition warrants further evaluation and I recommend that you be seen in a face-to-face visit.   NOTE: There will be NO CHARGE for this E-Visit   If you are having a true medical emergency, please call 911.     For an urgent face to face visit, [facility] has multiple urgent care centers for your convenience.  Click the link below for the full list of locations and hours, walk-in wait times, appointment scheduling options and driving directions:  Urgent Care - [place], [place], [place], [place], [place], [place]  [place]     Your MyChart E-visit questionnaire answers were reviewed by a board certified advanced clinical practitioner to complete your personal care plan based on your specific symptoms.    Thank you for using e-Visits.

## 2023-10-31 ENCOUNTER — Ambulatory Visit: Payer: Medicaid Other | Attending: Cardiology

## 2023-10-31 DIAGNOSIS — Z952 Presence of prosthetic heart valve: Secondary | ICD-10-CM | POA: Diagnosis not present

## 2023-10-31 DIAGNOSIS — Z7901 Long term (current) use of anticoagulants: Secondary | ICD-10-CM | POA: Diagnosis not present

## 2023-10-31 DIAGNOSIS — I351 Nonrheumatic aortic (valve) insufficiency: Secondary | ICD-10-CM | POA: Diagnosis not present

## 2023-10-31 LAB — POCT INR: INR: 1.6 — AB (ref 2.0–3.0)

## 2023-10-31 NOTE — Patient Instructions (Signed)
Description   Continue taking Warfarin 1 tablet (5mg ) daily except 1/2 tablet (2.5mg ) on Mondays.  Keep the number of servings of green leafy vegetables consistent in your diet.  Recheck in 2 weeks.  Coumadin Clinic [phone] call with medication changes and or bleeding.

## 2023-11-01 ENCOUNTER — Telehealth: Payer: Medicaid Other | Admitting: Physician Assistant

## 2023-11-01 DIAGNOSIS — J069 Acute upper respiratory infection, unspecified: Secondary | ICD-10-CM

## 2023-11-01 NOTE — Progress Notes (Signed)
  Because of multiple visits in the past few weeks for an ongoing cough and need for further assessment, I feel your condition warrants further evaluation and I recommend that you be seen in a face-to-face visit.   NOTE: There will be NO CHARGE for this E-Visit   If you are having a true medical emergency, please call 911.     For an urgent face to face visit, [facility] has multiple urgent care centers for your convenience.  Click the link below for the full list of locations and hours, walk-in wait times, appointment scheduling options and driving directions:  Urgent Care - [place], [place], [place], [place], [place], [place]       Your MyChart E-visit questionnaire answers were reviewed by a board certified advanced clinical practitioner to complete your personal care plan based on your specific symptoms.    Thank you for using e-Visits.

## 2023-11-02 ENCOUNTER — Telehealth: Payer: Self-pay

## 2023-11-02 NOTE — Telephone Encounter (Signed)
I spoke with patient who is having a back injection 11/19/23 and needed to reschedule INR appt.  I did not see a clearance form in chart, so told her to have physician fax form to our office.

## 2023-11-06 ENCOUNTER — Encounter (HOSPITAL_BASED_OUTPATIENT_CLINIC_OR_DEPARTMENT_OTHER): Payer: Self-pay

## 2023-11-07 ENCOUNTER — Telehealth: Payer: Self-pay | Admitting: Licensed Clinical Social Worker

## 2023-11-07 NOTE — Telephone Encounter (Signed)
 H&V Care Navigation CSW Progress Note  Clinical Social Worker contacted patient by phone to f/u again on housing and additional resources. Was able to reach her at [phone]. Pt states she has attempted to contact a few other housing options and has a friend planning to drive around with her to look at other private options around town that they may see signs for. Pt feeling a bit better in terms of cough, but still gets short of breath and tired. She hasn't been able to find a provider to complete request for wheelchair. I shared I could pass this request by team at [facility] to see if they were amenable but likely Dr. De Cuba is likely the best person to consult with about this since her pain mgmt team was not willing to complete at this time. No additional questions or concerns at this time, encouraged her to call me as needed.   Patient is participating in a Managed Medicaid Plan:  Yes- [employer]  SDOH Screenings   Food Insecurity: Food Insecurity Present (10/18/2023)  Housing: Low Risk  (10/18/2023)  Recent Concern: Housing - High Risk (08/19/2023)  Transportation Needs: No Transportation Needs (10/18/2023)  Recent Concern: Transportation Needs - Unmet Transportation Needs (08/19/2023)  Utilities: Not At Risk (03/22/2023)  Alcohol Screen: Low Risk  (07/02/2023)  Depression (PHQ2-9): High Risk (08/22/2023)  Financial Resource Strain: Medium Risk (10/18/2023)  Physical Activity: Inactive (08/19/2023)  Social Connections: Socially Isolated (08/19/2023)  Stress: Stress Concern Present (08/19/2023)  Tobacco Use: Low Risk  (10/26/2023)  Health Literacy: Adequate Health Literacy (10/18/2023)     Marit Lark, MSW, LCSW Clinical Social Worker II [facility] Heart/Vascular Care Navigation  [phone]- work cell phone (preferred) [phone]- desk phone

## 2023-11-12 NOTE — Telephone Encounter (Signed)
**Note De-identified  [place] Obfuscation** Please advise 

## 2023-11-14 ENCOUNTER — Telehealth: Payer: Self-pay | Admitting: *Deleted

## 2023-11-14 NOTE — Telephone Encounter (Addendum)
Ordering provider: Jodelle Red Associated diagnoses: G47.33 WatchPAT PA obtained on 11/14/2023 by Latrelle Dodrill, CMA. Authorization: No; tracking ID   Prior Authorization/Notification is not required for the requested service(s). Decision ID #: [id number] Patient notified of PIN (1234) on 11/14/2023 via Notification Method: phone.

## 2023-11-17 ENCOUNTER — Encounter (INDEPENDENT_AMBULATORY_CARE_PROVIDER_SITE_OTHER): Payer: Self-pay | Admitting: Cardiology

## 2023-11-17 DIAGNOSIS — G4733 Obstructive sleep apnea (adult) (pediatric): Secondary | ICD-10-CM

## 2023-11-22 ENCOUNTER — Encounter (HOSPITAL_BASED_OUTPATIENT_CLINIC_OR_DEPARTMENT_OTHER): Payer: Self-pay | Admitting: Family Medicine

## 2023-11-22 ENCOUNTER — Telehealth: Payer: Self-pay | Admitting: *Deleted

## 2023-11-22 ENCOUNTER — Ambulatory Visit: Payer: Medicaid Other | Attending: Cardiology

## 2023-11-22 DIAGNOSIS — G4733 Obstructive sleep apnea (adult) (pediatric): Secondary | ICD-10-CM

## 2023-11-22 NOTE — Telephone Encounter (Addendum)
Pt called and LMOM stating she started a medrol dose pack and robaxin 500mg .   Called and spoke to pt who stated that she started the medrol dose pack yesterday and will have the following schedule:   2/19- 24mg   2/20- 20 mg 2/21-16mg  2/22-12mg  2/23-8mg  2/24-4mg   Instructed pt to eat an extra serving of greens today and pt will have INR checked on Monday 2/24.   Informed pt that robaxin should not have an affect on her INR.

## 2023-11-22 NOTE — Procedures (Signed)
   SLEEP STUDY REPORT Patient Information Study Date: 11/17/2023 Patient Name: Mary Chambers Patient ID: [id number] Birth Date: Feb 19, 1971 Age: 53 Gender: Female BMI: 41.5 (W=264 lb, H=5' 7'') Referring Physician: Jodelle Red, MD  TEST DESCRIPTION: Home sleep apnea testing was completed using the WatchPat, a Type 1 device, utilizing peripheral arterial tonometry (PAT), chest movement, actigraphy, pulse oximetry, pulse rate, body position and snore. AHI was calculated with apnea and hypopnea using valid sleep time as the denominator. RDI includes apneas, hypopneas, and RERAs. The data acquired and the scoring of sleep and all associated events were performed in accordance with the recommended standards and specifications as outlined in the AASM Manual for the Scoring of Sleep and Associated Events 2.2.0 (2015).  FINDINGS:  1. Severe Obstructive Sleep Apnea with AHI 29.2hr overall and 51.6/hr during REM sleep.  2. No Central Sleep Apnea with pAHIc 0.1/hr.  3. Oxygen desaturations as low as 80%.  4. Severe snoring was present. O2 sats were < 88% for 143.6 min.  5. Total sleep time was 7 hrs and 22 min.  6. 19.8% of total sleep time was spent in REM sleep.  7. Normal sleep onset latency at 20 min.  8. Prolonged EM sleep onset latency at 109 min.  9. Total awakenings were 10. 10. Arrhythmia detection: None  DIAGNOSIS: Severe Obstructive Sleep Apnea (G47.33) Nocturnal Hypoxemia  RECOMMENDATIONS: 1. Clinical correlation of these findings is necessary. The decision to treat obstructive sleep apnea (OSA) is usually based on the presence of apnea symptoms or the presence of associated medical conditions such as Hypertension, Congestive Heart Failure, Atrial Fibrillation or Obesity. The most common symptoms of OSA are snoring, gasping for breath while sleeping, daytime sleepiness and fatigue.  2. Initiating apnea therapy is recommended given the presence of symptoms and/or  associated conditions. Recommend proceeding with one of the following:   a. Auto-CPAP therapy with a pressure range of 5-20cm H2O.   b. An oral appliance (OA) that can be obtained from certain dentists with expertise in sleep medicine. These are primarily of use in non-obese patients with mild and moderate disease.   c. An ENT consultation which may be useful to look for specific causes of obstruction and possible treatment options.   d. If patient is intolerant to PAP therapy, consider referral to ENT for evaluation for hypoglossal nerve stimulator.  3. Close follow-up is necessary to ensure success with CPAP or oral appliance therapy for maximum benefit .  4. A follow-up oximetry study on CPAP is recommended to assess the adequacy of therapy and determine the need for supplemental oxygen or the potential need for Bi-level therapy. An arterial blood gas to determine the adequacy of baseline ventilation and oxygenation should also be considered.  5. Healthy sleep recommendations include: adequate nightly sleep (normal 7-9 hrs/night), avoidance of caffeine after noon and alcohol near bedtime, and maintaining a sleep environment that is cool, dark and quiet.  6. Weight loss for overweight patients is recommended. Even modest amounts of weight loss can significantly improve the severity of sleep apnea.  7. Snoring recommendations include: weight loss where appropriate, side sleeping, and avoidance of alcohol before bed.  8. Operation of motor vehicle should be avoided when sleepy.  Signature: Armanda Magic, MD; [facility]; Diplomat, American Board of Sleep Medicine Electronically Signed: 11/22/2023 12:20:17 PM

## 2023-11-23 MED ORDER — CETIRIZINE HCL 10 MG PO TABS: 10.0000 mg | ORAL_TABLET | Freq: Every day | ORAL | 3 refills | Status: DC

## 2023-11-26 ENCOUNTER — Ambulatory Visit: Payer: Medicaid Other | Attending: Cardiology

## 2023-11-26 DIAGNOSIS — Z952 Presence of prosthetic heart valve: Secondary | ICD-10-CM | POA: Diagnosis not present

## 2023-11-26 DIAGNOSIS — I351 Nonrheumatic aortic (valve) insufficiency: Secondary | ICD-10-CM

## 2023-11-26 DIAGNOSIS — Z7901 Long term (current) use of anticoagulants: Secondary | ICD-10-CM | POA: Diagnosis not present

## 2023-11-26 LAB — POCT INR: INR: 1.7 — AB (ref 2.0–3.0)

## 2023-11-26 NOTE — Patient Instructions (Signed)
 Description   Continue taking Warfarin 1 tablet (5mg ) daily except 1/2 tablet (2.5mg ) on Mondays.  Keep the number of servings of green leafy vegetables consistent in your diet.  Recheck in 3 weeks  Coumadin Clinic [phone] call with medication changes and or bleeding.

## 2023-11-28 ENCOUNTER — Telehealth: Payer: Self-pay | Admitting: Licensed Clinical Social Worker

## 2023-11-29 ENCOUNTER — Telehealth: Payer: Self-pay

## 2023-11-29 DIAGNOSIS — Z7901 Long term (current) use of anticoagulants: Secondary | ICD-10-CM

## 2023-11-29 DIAGNOSIS — G4733 Obstructive sleep apnea (adult) (pediatric): Secondary | ICD-10-CM

## 2023-11-29 DIAGNOSIS — R002 Palpitations: Secondary | ICD-10-CM

## 2023-11-29 DIAGNOSIS — R5383 Other fatigue: Secondary | ICD-10-CM

## 2023-11-29 DIAGNOSIS — I10 Essential (primary) hypertension: Secondary | ICD-10-CM

## 2023-11-29 DIAGNOSIS — I351 Nonrheumatic aortic (valve) insufficiency: Secondary | ICD-10-CM

## 2023-11-29 DIAGNOSIS — Z952 Presence of prosthetic heart valve: Secondary | ICD-10-CM

## 2023-11-29 NOTE — Telephone Encounter (Signed)
 Notified patient of sleep study results and recommendations. All questions were answered and patient verbalized understanding. CPAP Titration ordered today.

## 2023-11-29 NOTE — Telephone Encounter (Signed)
-----   Message from Armanda Magic sent at 11/22/2023 12:21 PM EST ----- Please let patient know that they have sleep apnea.  Recommend therapeutic CPAP titration for treatment of patient's sleep disordered breathing.

## 2023-11-29 NOTE — Telephone Encounter (Signed)
 H&V Care Navigation CSW Progress Note  Clinical Social Worker  received a call from pt  to inquire if she could receive another heart pillow that she had gotten in hospital post surgery. LCSW discussed with Jenna, AHF LCSW. These are given by inpatient staff and pt last hospital stay was in 2023. I called pt back and let her know that we aren't able to give additional pillow at this time, encouraged her to call me if any additional questions/concerns arise. No additional questions at this time  Patient is participating in a Managed Medicaid Plan:  Yes- [employer]  SDOH Screenings   Food Insecurity: Food Insecurity Present (11/26/2023)  Housing: High Risk (11/26/2023)  Transportation Needs: No Transportation Needs (11/26/2023)  Utilities: Not At Risk (03/22/2023)  Alcohol Screen: Low Risk  (11/26/2023)  Depression (PHQ2-9): High Risk (08/22/2023)  Financial Resource Strain: Medium Risk (11/26/2023)  Physical Activity: Insufficiently Active (11/26/2023)  Social Connections: Socially Integrated (11/26/2023)  Stress: Stress Concern Present (11/26/2023)  Tobacco Use: Low Risk  (10/26/2023)  Health Literacy: Adequate Health Literacy (10/18/2023)    Mary Chambers, MSW, LCSW Clinical Social Worker II [facility] Heart/Vascular Care Navigation  [phone]- work cell phone (preferred) [phone]- desk phone

## 2023-11-30 ENCOUNTER — Encounter (HOSPITAL_BASED_OUTPATIENT_CLINIC_OR_DEPARTMENT_OTHER): Payer: Self-pay | Admitting: Family Medicine

## 2023-11-30 ENCOUNTER — Encounter (HOSPITAL_BASED_OUTPATIENT_CLINIC_OR_DEPARTMENT_OTHER): Payer: Self-pay | Admitting: Certified Nurse Midwife

## 2023-11-30 ENCOUNTER — Ambulatory Visit (HOSPITAL_BASED_OUTPATIENT_CLINIC_OR_DEPARTMENT_OTHER): Payer: Medicaid Other | Admitting: Certified Nurse Midwife

## 2023-11-30 ENCOUNTER — Other Ambulatory Visit (HOSPITAL_BASED_OUTPATIENT_CLINIC_OR_DEPARTMENT_OTHER): Payer: Self-pay

## 2023-11-30 ENCOUNTER — Ambulatory Visit (HOSPITAL_BASED_OUTPATIENT_CLINIC_OR_DEPARTMENT_OTHER): Payer: Medicaid Other | Admitting: Family Medicine

## 2023-11-30 VITALS — BP 153/77 | HR 72 | Ht 67.0 in | Wt 263.0 lb

## 2023-11-30 VITALS — BP 147/99 | HR 78 | Ht 67.0 in | Wt 258.4 lb

## 2023-11-30 DIAGNOSIS — R262 Difficulty in walking, not elsewhere classified: Secondary | ICD-10-CM | POA: Diagnosis not present

## 2023-11-30 DIAGNOSIS — N952 Postmenopausal atrophic vaginitis: Secondary | ICD-10-CM | POA: Diagnosis not present

## 2023-11-30 DIAGNOSIS — E559 Vitamin D deficiency, unspecified: Secondary | ICD-10-CM | POA: Diagnosis not present

## 2023-11-30 DIAGNOSIS — L989 Disorder of the skin and subcutaneous tissue, unspecified: Secondary | ICD-10-CM | POA: Insufficient documentation

## 2023-11-30 MED ORDER — FLUTICASONE PROPIONATE 50 MCG/ACT NA SUSP: 2.0000 | Freq: Every day | NASAL | 6 refills | Status: DC

## 2023-11-30 MED ORDER — PREMARIN 0.625 MG/GM VA CREA
TOPICAL_CREAM | Freq: Two times a day (BID) | VAGINAL | 12 refills | Status: DC
  Filled 2023-11-30: qty 30, 30d supply, fill #0

## 2023-11-30 MED ORDER — PREMARIN 0.625 MG/GM VA CREA: TOPICAL_CREAM | Freq: Two times a day (BID) | VAGINAL | 12 refills | Status: DC

## 2023-11-30 NOTE — Assessment & Plan Note (Signed)
 Patient is requesting order for manual wheelchair.  She reports difficulty with mobility and ambulation.  Difficulties will occur at home, particularly when trying to prepare meals and eat.  She also notes issues if walking longer than 200 feet, or walking/standing longer than 15 minutes duration. Requesting to have prescription for manual wheelchair to assist with mobility concerns. Requires assistance with ambulation at baseline - ambulates with cane. Current medical co-morbidities affecting this include bilateral lower extremity edema, lumbar spinal stenosis, obesity, osteoarthritis. Order has been sent to DME company for manual wheelchair.

## 2023-11-30 NOTE — Patient Instructions (Signed)
 Estradiol Vaginal Cream What is this medication? ESTRADIOL (es tra DYE ole) reduces vaginal irritation, dryness, and pain during sex due to menopause. It is an estrogen hormone. This medicine may be used for other purposes; ask your health care provider or pharmacist if you have questions. COMMON BRAND NAME(S): Estrace What should I tell my care team before I take this medication? They need to know if you have any of these conditions: Abnormal vaginal bleeding Blood vessel disease or blood clots Breast, cervical, endometrial, ovarian, liver, or uterine cancer Dementia Diabetes Gallbladder disease Heart disease or recent heart attack High blood pressure High cholesterol High levels of calcium in the blood Hysterectomy Kidney disease Liver disease Migraine headaches Protein C/S deficiency Stroke Systemic lupus erythematosus (SLE) Tobacco use An unusual or allergic reaction to estrogens, soy, other medications, foods, dyes, or preservatives Pregnant or trying to get pregnant Breast-feeding How should I use this medication? This medication is for use in the vagina only. Do not take by mouth. Follow the directions on the prescription label. Read package directions carefully before using. Use the special applicator supplied with the cream. Wash hands before and after use. Fill the applicator with the prescribed amount of cream. Lie on your back, part and bend your knees. Insert the applicator into the vagina and push the plunger to expel the cream into the vagina. Wash the applicator with warm soapy water and rinse well. Use exactly as directed for the complete length of time prescribed. Do not stop using except on the advice of your care team. A patient package insert for the product will be given with each prescription and refill. Read this sheet carefully each time. The sheet may change frequently. Talk to your care team about the use of this medication in children. This medication is not  approved for use in children. Overdosage: If you think you have taken too much of this medicine contact a poison control center or emergency room at once. NOTE: This medicine is only for you. Do not share this medicine with others. What if I miss a dose? If you miss a dose, use it as soon as you can. If it is almost time for your next dose, use only that dose. Do not use double or extra doses. What may interact with this medication? Do not take this medication with any of the following: Aromatase inhibitors like aminoglutethimide, anastrozole, exemestane, letrozole, testolactone This medication may also interact with the following: Barbiturates used for inducing sleep or treating seizures Carbamazepine Grapefruit juice Medications for fungal infections like ketoconazole and itraconazole Raloxifene Rifabutin Rifampin Rifapentine Ritonavir Some antibiotics used to treat infections St. John's Wort Tamoxifen Warfarin This list may not describe all possible interactions. Give your health care provider a list of all the medicines, herbs, non-prescription drugs, or dietary supplements you use. Also tell them if you smoke, drink alcohol, or use illegal drugs. Some items may interact with your medicine. What should I watch for while using this medication? Visit your care team for regular checks on your progress. You will need a regular breast and pelvic exam. You should also discuss the need for regular mammograms with your care team, and follow their guidelines. This medication can make your body retain fluid, making your fingers, hands, or ankles swell. Your blood pressure can go up. Contact your care team if you feel you are retaining fluid. If you have any reason to think you are pregnant, stop taking this medication at once and contact your care team.  Smoking tobacco increases the risk of getting a blood clot or having a stroke while you are taking this medication, especially if you are older  than 35 years. If you wear contact lenses and notice visual changes, or if the lenses begin to feel uncomfortable, consult your eye care specialist. If you are going to have elective surgery, you may need to stop taking this medication beforehand. Consult your care team for advice prior to scheduling the surgery. What side effects may I notice from receiving this medication? Side effects that you should report to your care team as soon as possible: Allergic reactions--skin rash, itching, hives, swelling of the face, lips, tongue, or throat Blood clot--pain, swelling, or warmth in the leg, shortness of breath, chest pain Breast tissue changes, new lumps, redness, pain, or discharge from the nipple Gallbladder problems--severe stomach pain, nausea, vomiting, fever Increase in blood pressure Liver injury--right upper belly pain, loss of appetite, nausea, light-colored stool, dark yellow or brown urine, yellowing skin or eyes, unusual weakness or fatigue Stroke--sudden numbness or weakness of the face, arm, or leg, trouble speaking, confusion, trouble walking, loss of balance or coordination, dizziness, severe headache, change in vision Unusual vaginal discharge, itching, or odor Vaginal bleeding after menopause, pelvic pain Side effects that usually do not require medical attention (report to your care team if they continue or are bothersome): Bloating Breast pain or tenderness Nausea Vaginal irritation at application site Vomiting This list may not describe all possible side effects. Call your doctor for medical advice about side effects. You may report side effects to FDA at 1-800-FDA-1088. Where should I keep my medication? Keep out of the reach of children and pets. Store at room temperature between 15 and 30 degrees C (59 and 86 degrees F). Protect from temperatures above 40 degrees C (104 degrees C). Do not freeze. Throw away any unused medication after the expiration date. NOTE: This  sheet is a summary. It may not cover all possible information. If you have questions about this medicine, talk to your doctor, pharmacist, or health care provider.  2024 Elsevier/Gold Standard (2021-05-27 00:00:00)

## 2023-11-30 NOTE — Progress Notes (Signed)
    Procedures performed today:    None.  Independent interpretation of notes and tests performed by another provider:   None.  Brief History, Exam, Impression, and Recommendations:    BP (!) 147/99 (BP Location: Left Wrist, Patient Position: Sitting, Cuff Size: Normal)   Pulse 78   Ht 5\' 7"  (1.702 m)   Wt 258 lb 6.4 oz (117.2 kg)   SpO2 92%   BMI 40.47 kg/m   Impaired ambulation Assessment & Plan: Patient is requesting order for manual wheelchair.  She reports difficulty with mobility and ambulation.  Difficulties will occur at home, particularly when trying to prepare meals and eat.  She also notes issues if walking longer than 200 feet, or walking/standing longer than 15 minutes duration. Requesting to have prescription for manual wheelchair to assist with mobility concerns. Requires assistance with ambulation at baseline - ambulates with cane. Current medical co-morbidities affecting this include bilateral lower extremity edema, lumbar spinal stenosis, obesity, osteoarthritis. Order has been sent to DME company for manual wheelchair.   Vitamin D deficiency Assessment & Plan: Can proceed with recheck of vitamin D level today for monitoring  Orders: -     VITAMIN D 25 Hydroxy (Vit-D Deficiency, Fractures)  Hypomagnesemia Assessment & Plan: Reports that she has been experiencing more issues with cramping in her feet.  Indicates that she takes OTC magnesium for her feet due to this cramping and restlessness.  Given increase in symptoms, she is requesting to have her magnesium level checked. Can proceed with magnesium evaluation at this time  Orders: -     Magnesium  Skin lesion Assessment & Plan: Patient notes skin lesion over right cheek which has been present for some time, not Nestl aware of any significant changes there.  Concerned about duration that this has been present.  Requesting referral to dermatology for further evaluation. Patient does have a flesh-colored  papule over right cheek.  No signs of irritation or inflammation on exam. Can proceed with referral to dermatology as requested  Orders: -     Ambulatory referral to Dermatology  Other orders -     Fluticasone Propionate; Place 2 sprays into both nostrils daily.  Dispense: 16 g; Refill: 6  Return in about 4 months (around 03/29/2024).   ___________________________________________ Mary Kalisz de Peru, MD, ABFM, CAQSM Primary Care and Sports Medicine [facility]

## 2023-11-30 NOTE — Patient Instructions (Signed)
  Medication Instructions:  Your physician recommends that you continue on your current medications as directed. Please refer to the Current Medication list given to you today. --If you need a refill on any your medications before your next appointment, please call your pharmacy first. If no refills are authorized on file call the office.-- Lab Work: Your physician has recommended that you have lab work today: today If you have labs (blood work) drawn today and your tests are completely normal, you will receive your results via MyChart message OR a phone call from our staff.  Please ensure you check your voicemail in the event that you authorized detailed messages to be left on a delegated number. If you have any lab test that is abnormal or we need to change your treatment, we will call you to review the results.   Follow-Up: Your next appointment:   Your physician recommends that you schedule a follow-up appointment in: 4 month follow up  with Dr. de [place]  You will receive a text message or e-mail with a link to a survey about your care and experience with [place] today! We would greatly appreciate your feedback!   Thanks for letting us be apart of your health journey!!  Primary Care and Sports Medicine   Dr. Ceasar Mons [place]   We encourage you to activate your patient portal called "MyChart".  Sign up information is provided on this After Visit Summary.  MyChart is used to connect with patients for Virtual Visits (Telemedicine).  Patients are able to view lab/test results, encounter notes, upcoming appointments, etc.  Non-urgent messages can be sent to your provider as well. To learn more about what you can do with MyChart, please visit --  [email or web address].

## 2023-11-30 NOTE — Progress Notes (Signed)
"  Dryness during sex"  Appt complete at 9:55am  Mary Chambers is a 53yo G3P3 here for new problem gyn visit. She is accompanied by her supportive significant other Mr. Dan Humphreys. They have been sexually active and monogamous for many years. Recently she has started to note vaginal dryness during intercourse making intercourse uncomfortable "due to the friction". She sometimes has noticed some red spotting on toilet tissue after intercourse. Sex is now painful due to the dryness. She has tried OTC lubricant and Replens vaginal moisturizer. Has occasional hot flashes and night sweats. She uses Estroven at times.   Subjective:     Mary Chambers is a 53 y.o. female who presents for evaluation of above vaginal dryness Symptoms have been present for several weeks. Vaginal symptoms: dyspareunia and dryness . Contraception: status post hysterectomy. She denies blisters, discharge, lesions, odor, and vulvar itching Sexually transmitted infection risk:  none per pt report . Marland Kitchen  The following portions of the patient's history were reviewed and updated as appropriate: allergies, current medications, past family history, past medical history, past social history, past surgical history, and problem list.   Review of Systems Pertinent items are noted in HPI.    Objective:    BP (!) 153/77 (BP Location: Right Arm, Patient Position: Sitting)   Pulse 72   Ht 5\' 7"  (1.702 m)   Wt 263 lb (119.3 kg)   BMI 41.19 kg/m  General appearance: alert and cooperative Pelvic:  vagina appears pale/dry/atrophic. No vaginal spotting or bleeding noted.     Assessment:    Postmenopausal vaginal atrophy.    Plan:    Discussed vaginal estrogen. Pt will start with 2gms twice daily for 14 days, then decrease to 2-3 times weekly at bedtime. Once she decreases estradiol vaginally to 2-3 times weekly, we will continue Replens vaginal moisturizers on nights when she is not using estradiol. RTO 3 months for follow-up  visit.  Examination chaperoned by Hendricks Milo CMA.   Letta Kocher

## 2023-11-30 NOTE — Assessment & Plan Note (Signed)
 Patient notes skin lesion over right cheek which has been present for some time, not Nestl aware of any significant changes there.  Concerned about duration that this has been present.  Requesting referral to dermatology for further evaluation. Patient does have a flesh-colored papule over right cheek.  No signs of irritation or inflammation on exam. Can proceed with referral to dermatology as requested

## 2023-11-30 NOTE — Assessment & Plan Note (Signed)
 Reports that she has been experiencing more issues with cramping in her feet.  Indicates that she takes OTC magnesium for her feet due to this cramping and restlessness.  Given increase in symptoms, she is requesting to have her magnesium level checked. Can proceed with magnesium evaluation at this time

## 2023-11-30 NOTE — Assessment & Plan Note (Signed)
 Can proceed with recheck of vitamin D level today for monitoring

## 2023-12-01 LAB — VITAMIN D 25 HYDROXY (VIT D DEFICIENCY, FRACTURES): Vit D, 25-Hydroxy: 45.1 ng/mL (ref 30.0–100.0)

## 2023-12-06 ENCOUNTER — Encounter (HOSPITAL_BASED_OUTPATIENT_CLINIC_OR_DEPARTMENT_OTHER): Payer: Self-pay | Admitting: Certified Nurse Midwife

## 2023-12-06 ENCOUNTER — Encounter (HOSPITAL_BASED_OUTPATIENT_CLINIC_OR_DEPARTMENT_OTHER): Payer: Self-pay | Admitting: Family Medicine

## 2023-12-06 ENCOUNTER — Other Ambulatory Visit (HOSPITAL_BASED_OUTPATIENT_CLINIC_OR_DEPARTMENT_OTHER): Payer: Self-pay | Admitting: Certified Nurse Midwife

## 2023-12-06 ENCOUNTER — Other Ambulatory Visit (HOSPITAL_BASED_OUTPATIENT_CLINIC_OR_DEPARTMENT_OTHER): Payer: Self-pay | Admitting: *Deleted

## 2023-12-06 MED ORDER — PREMARIN 0.625 MG/GM VA CREA: TOPICAL_CREAM | Freq: Two times a day (BID) | VAGINAL | 1 refills | Status: DC

## 2023-12-06 MED ORDER — PREMARIN 0.625 MG/GM VA CREA: TOPICAL_CREAM | Freq: Two times a day (BID) | VAGINAL | 12 refills | Status: DC

## 2023-12-06 NOTE — Progress Notes (Signed)
 Pt request rx for Premarin to be sent to [facility] in [place]. Prescription changed.

## 2023-12-09 DIAGNOSIS — S63649A Sprain of metacarpophalangeal joint of unspecified thumb, initial encounter: Secondary | ICD-10-CM | POA: Insufficient documentation

## 2023-12-12 ENCOUNTER — Ambulatory Visit: Payer: Medicaid Other

## 2023-12-12 ENCOUNTER — Telehealth: Admitting: Physician Assistant

## 2023-12-12 DIAGNOSIS — R3989 Other symptoms and signs involving the genitourinary system: Secondary | ICD-10-CM | POA: Diagnosis not present

## 2023-12-12 MED ORDER — CEPHALEXIN 500 MG PO CAPS: 500.0000 mg | ORAL_CAPSULE | Freq: Two times a day (BID) | ORAL | 0 refills | Status: AC

## 2023-12-12 NOTE — Progress Notes (Signed)
 I have spent 5 minutes in review of e-visit questionnaire, review and updating patient chart, medical decision making and response to patient.   Piedad Climes, PA-C

## 2023-12-12 NOTE — Progress Notes (Signed)

## 2023-12-16 ENCOUNTER — Encounter (HOSPITAL_BASED_OUTPATIENT_CLINIC_OR_DEPARTMENT_OTHER): Payer: Self-pay

## 2023-12-17 ENCOUNTER — Telehealth (HOSPITAL_BASED_OUTPATIENT_CLINIC_OR_DEPARTMENT_OTHER): Payer: Self-pay | Admitting: Licensed Clinical Social Worker

## 2023-12-17 MED ORDER — AMOXICILLIN 500 MG PO TABS: 2000.0000 mg | ORAL_TABLET | Freq: Once | ORAL | 0 refills | Status: DC

## 2023-12-17 NOTE — Telephone Encounter (Signed)
 H&V Care Navigation CSW Progress Note  Clinical Social Worker  received call from pt [phone])  to ask for guidance as she is having issues scheduling f/u with Dr. Cristal Deer. Shared with pt that unfortunately I am unable to manage scheduling but can forward concern to Dr. Di Kindle nurse to see if any options. Shared provider is out of the office today. Pt agreeable to me passing on concern to team. No additional questions at this time.  Patient is participating in a Managed Medicaid Plan:  Yes- [employer]  SDOH Screenings   Food Insecurity: Food Insecurity Present (11/26/2023)  Housing: High Risk (11/26/2023)  Transportation Needs: No Transportation Needs (11/26/2023)  Utilities: Not At Risk (03/22/2023)  Alcohol Screen: Low Risk  (11/26/2023)  Depression (PHQ2-9): Low Risk  (11/30/2023)  Financial Resource Strain: Medium Risk (11/26/2023)  Physical Activity: Insufficiently Active (11/26/2023)  Social Connections: Socially Integrated (11/26/2023)  Stress: Stress Concern Present (11/26/2023)  Tobacco Use: Low Risk  (11/30/2023)  Health Literacy: Adequate Health Literacy (10/18/2023)    Octavio Graves, MSW, LCSW Clinical Social Worker II [facility] Heart/Vascular Care Navigation  [phone]- work cell phone (preferred) [phone]- desk phone

## 2023-12-17 NOTE — Telephone Encounter (Signed)
 Called and spoke to patient. DOB verified.  Patient's concerns:  - pt concerned on scheduling her 6 month appointment.  Nurse's Recommendations:  - informed pt that 6 month recall is in place.  - pt is to be notified by mail on 4/15 for 6 mo f/u reminder.   - recommended calling next month to f/u on July schedule.  Patient verbalized understanding.

## 2023-12-18 ENCOUNTER — Ambulatory Visit

## 2023-12-21 ENCOUNTER — Ambulatory Visit: Attending: Cardiology

## 2023-12-21 DIAGNOSIS — Z952 Presence of prosthetic heart valve: Secondary | ICD-10-CM

## 2023-12-21 DIAGNOSIS — I351 Nonrheumatic aortic (valve) insufficiency: Secondary | ICD-10-CM

## 2023-12-21 DIAGNOSIS — Z7901 Long term (current) use of anticoagulants: Secondary | ICD-10-CM

## 2023-12-21 LAB — POCT INR: INR: 2 (ref 2.0–3.0)

## 2023-12-21 NOTE — Patient Instructions (Signed)
 Continue taking Warfarin 1 tablet (5mg ) daily except 1/2 tablet (2.5mg ) on Mondays.  Keep the number of servings of green leafy vegetables consistent in your diet.  Recheck in 5 weeks.  [facility] [phone] call with medication changes and or bleeding.

## 2023-12-24 ENCOUNTER — Other Ambulatory Visit: Payer: Self-pay | Admitting: Family Medicine

## 2023-12-24 DIAGNOSIS — Z1231 Encounter for screening mammogram for malignant neoplasm of breast: Secondary | ICD-10-CM

## 2023-12-31 ENCOUNTER — Encounter (HOSPITAL_BASED_OUTPATIENT_CLINIC_OR_DEPARTMENT_OTHER): Payer: Self-pay | Admitting: Family Medicine

## 2023-12-31 ENCOUNTER — Telehealth (HOSPITAL_BASED_OUTPATIENT_CLINIC_OR_DEPARTMENT_OTHER): Payer: Self-pay | Admitting: *Deleted

## 2023-12-31 NOTE — Telephone Encounter (Signed)
 Called patient to see where she wanted DME order for wheelchair to go she had no idea who covered it through insurance.  She will call insurance company to see who they prefer and send a mychart message back so we can send over order

## 2024-01-06 ENCOUNTER — Telehealth: Admitting: Physician Assistant

## 2024-01-06 DIAGNOSIS — J208 Acute bronchitis due to other specified organisms: Secondary | ICD-10-CM

## 2024-01-07 MED ORDER — BENZONATATE 100 MG PO CAPS: 100.0000 mg | ORAL_CAPSULE | Freq: Three times a day (TID) | ORAL | 0 refills | Status: DC | PRN

## 2024-01-07 NOTE — Progress Notes (Signed)
E-Visit for Cough  We are sorry that you are not feeling well.  Here is how we plan to help!  Based on your presentation I believe you most likely have A cough due to a virus.  This is called viral bronchitis and is best treated by rest, plenty of fluids and control of the cough.  You may use Ibuprofen or Tylenol as directed to help your symptoms.     In addition you may use A non-prescription cough medication called Mucinex DM: take 2 tablets every 12 hours. and A prescription cough medication called Tessalon Perles 100mg. You may take 1-2 capsules every 8 hours as needed for your cough.   From your responses in the eVisit questionnaire you describe inflammation in the upper respiratory tract which is causing a significant cough.  This is commonly called Bronchitis and has four common causes:   Allergies Viral Infections Acid Reflux Bacterial Infection Allergies, viruses and acid reflux are treated by controlling symptoms or eliminating the cause. An example might be a cough caused by taking certain blood pressure medications. You stop the cough by changing the medication. Another example might be a cough caused by acid reflux. Controlling the reflux helps control the cough.  USE OF BRONCHODILATOR ("RESCUE") INHALERS: There is a risk from using your bronchodilator too frequently.  The risk is that over-reliance on a medication which only relaxes the muscles surrounding the breathing tubes can reduce the effectiveness of medications prescribed to reduce swelling and congestion of the tubes themselves.  Although you feel brief relief from the bronchodilator inhaler, your asthma may actually be worsening with the tubes becoming more swollen and filled with mucus.  This can delay other crucial treatments, such as oral steroid medications. If you need to use a bronchodilator inhaler daily, several times per day, you should discuss this with your provider.  There are probably better treatments that could  be used to keep your asthma under control.     HOME CARE Only take medications as instructed by your medical team. Complete the entire course of an antibiotic. Drink plenty of fluids and get plenty of rest. Avoid close contacts especially the very young and the elderly Cover your mouth if you cough or cough into your sleeve. Always remember to wash your hands A steam or ultrasonic humidifier can help congestion.   GET HELP RIGHT AWAY IF: You develop worsening fever. You become short of breath You cough up blood. Your symptoms persist after you have completed your treatment plan MAKE SURE YOU  Understand these instructions. Will watch your condition. Will get help right away if you are not doing well or get worse.    Thank you for choosing an e-visit.  Your e-visit answers were reviewed by a board certified advanced clinical practitioner to complete your personal care plan. Depending upon the condition, your plan could have included both over the counter or prescription medications.  Please review your pharmacy choice. Make sure the pharmacy is open so you can pick up prescription now. If there is a problem, you may contact your provider through MyChart messaging and have the prescription routed to another pharmacy.  Your safety is important to us. If you have drug allergies check your prescription carefully.   For the next 24 hours you can use MyChart to ask questions about today's visit, request a non-urgent call back, or ask for a work or school excuse. You will get an email in the next two days asking about your experience.   I hope that your e-visit has been valuable and will speed your recovery.  I have spent 5 minutes in review of e-visit questionnaire, review and updating patient chart, medical decision making and response to patient.   Erinn Huskins M Geniva Lohnes, PA-C   

## 2024-01-09 ENCOUNTER — Ambulatory Visit

## 2024-01-11 ENCOUNTER — Other Ambulatory Visit

## 2024-01-11 ENCOUNTER — Encounter: Payer: Self-pay | Admitting: Nurse Practitioner

## 2024-01-11 ENCOUNTER — Ambulatory Visit: Admitting: Nurse Practitioner

## 2024-01-11 VITALS — BP 120/80 | HR 72 | Ht 67.0 in | Wt 282.0 lb

## 2024-01-11 DIAGNOSIS — R1032 Left lower quadrant pain: Secondary | ICD-10-CM

## 2024-01-11 DIAGNOSIS — R1031 Right lower quadrant pain: Secondary | ICD-10-CM

## 2024-01-11 DIAGNOSIS — K5900 Constipation, unspecified: Secondary | ICD-10-CM

## 2024-01-11 DIAGNOSIS — K5790 Diverticulosis of intestine, part unspecified, without perforation or abscess without bleeding: Secondary | ICD-10-CM

## 2024-01-11 DIAGNOSIS — K5730 Diverticulosis of large intestine without perforation or abscess without bleeding: Secondary | ICD-10-CM

## 2024-01-11 DIAGNOSIS — K219 Gastro-esophageal reflux disease without esophagitis: Secondary | ICD-10-CM

## 2024-01-11 DIAGNOSIS — R1030 Lower abdominal pain, unspecified: Secondary | ICD-10-CM | POA: Diagnosis not present

## 2024-01-11 DIAGNOSIS — Z860101 Personal history of adenomatous and serrated colon polyps: Secondary | ICD-10-CM

## 2024-01-11 LAB — COMPREHENSIVE METABOLIC PANEL WITH GFR
ALT: 16 U/L (ref 0–35)
AST: 18 U/L (ref 0–37)
Albumin: 4.1 g/dL (ref 3.5–5.2)
Alkaline Phosphatase: 65 U/L (ref 39–117)
BUN: 21 mg/dL (ref 6–23)
CO2: 26 meq/L (ref 19–32)
Calcium: 8.8 mg/dL (ref 8.4–10.5)
Chloride: 106 meq/L (ref 96–112)
Creatinine, Ser: 0.88 mg/dL (ref 0.40–1.20)
GFR: 74.97 mL/min (ref >60.00)
Glucose, Bld: 89 mg/dL (ref 70–99)
Potassium: 4.1 meq/L (ref 3.5–5.1)
Sodium: 140 meq/L (ref 135–145)
Total Bilirubin: 0.4 mg/dL (ref 0.2–1.2)
Total Protein: 6.6 g/dL (ref 6.0–8.3)

## 2024-01-11 LAB — CBC WITH DIFFERENTIAL/PLATELET
Basophils Absolute: 0.1 10*3/uL (ref 0.0–0.1)
Basophils Relative: 0.9 % (ref 0.0–3.0)
Eosinophils Absolute: 0.4 10*3/uL (ref 0.0–0.7)
Eosinophils Relative: 5.1 % — ABNORMAL HIGH (ref 0.0–5.0)
HCT: 38.4 % (ref 36.0–46.0)
Hemoglobin: 12.6 g/dL (ref 12.0–15.0)
Lymphocytes Relative: 32.3 % (ref 12.0–46.0)
Lymphs Abs: 2.3 10*3/uL (ref 0.7–4.0)
MCHC: 32.7 g/dL (ref 30.0–36.0)
MCV: 82.3 fl (ref 78.0–100.0)
Monocytes Absolute: 0.6 10*3/uL (ref 0.1–1.0)
Monocytes Relative: 8.7 % (ref 3.0–12.0)
Neutro Abs: 3.8 10*3/uL (ref 1.4–7.7)
Neutrophils Relative %: 53 % (ref 43.0–77.0)
Platelets: 280 10*3/uL (ref 150.0–400.0)
RBC: 4.66 Mil/uL (ref 3.87–5.11)
RDW: 15.3 % (ref 11.5–15.5)
WBC: 7.2 10*3/uL (ref 4.0–10.5)

## 2024-01-11 NOTE — Patient Instructions (Signed)
 You have been scheduled for a CT scan of the abdomen and pelvis at [facility], 1st floor Radiology. You are scheduled on 01/14/24 at 4:30 pm. You should arrive 2 hours prior to your appointment time for registration Please arrive by 2:15 pm.  Please follow the written instructions below on the day of your exam:   2) Drink 1 bottle of contrast @ 2:30 pm (2 hours prior to your exam)  Remember to shake well before drinking and do NOT pour over ice.     Drink 1 bottle of contrast @ 3:30 pm (1 hour prior to your exam)   You may take any medications as prescribed with a small amount of water, if necessary. If you take any of the following medications: METFORMIN, GLUCOPHAGE, GLUCOVANCE, AVANDAMET, RIOMET, FORTAMET, ACTOPLUS MET, JANUMET, GLUMETZA or METAGLIP, you MAY be asked to HOLD this medication 48 hours AFTER the exam.   The purpose of you drinking the oral contrast is to aid in the visualization of your intestinal tract. The contrast solution may cause some diarrhea. Depending on your individual set of symptoms, you may also receive an intravenous injection of x-ray contrast/dye. Plan on being at [facility] for 45 minutes or longer, depending on the type of exam you are having performed.   If you have any questions regarding your exam or if you need to reschedule, you may call [facility] at [phone] between the hours of 8:00 am and 5:00 pm, Monday-Friday.    Your provider has requested that you go to the basement level for lab work before leaving today. Press "B" on the elevator. The lab is located at the first door on the left as you exit the elevator.  Miralax- take every night, may increase to twice daily if needed  Drink 8 glasses of water daily  Go to the emergency room if severe abdominal pain recurs.  Due to recent changes in healthcare laws, you may see the results of your imaging and laboratory studies on MyChart before your provider has had a chance to  review them.  We understand that in some cases there may be results that are confusing or concerning to you. Not all laboratory results come back in the same time frame and the provider may be waiting for multiple results in order to interpret others.  Please give [place] 48 hours in order for your provider to thoroughly review all the results before contacting the office for clarification of your results.   Thank you for trusting me with your gastrointestinal care!   Alcide Evener, CRNP

## 2024-01-11 NOTE — Progress Notes (Signed)
 Agree with assessment and plan as outlined.

## 2024-01-11 NOTE — Progress Notes (Signed)
 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 She is known by Dr. Adela Lank. She presents to our office today for further evaluation regarding vomiting which started the first week of October 2024.  She is accompanied by her significant other. She sometimes had nausea and other times vomiting was abrupt without preceding nausea.  She stated vomiting up partially digested food and liquids, could not keep anything down.  No coffee-ground or frank hematemesis.  She started taking [facility] for weight loss end of August or early September 2024 which she discontinued 07/18/2023.  Her vomiting episodes stopped 1 week after discontinuing Wegovy.  No heartburn or dysphagia.  She developed lower abdominal pain early November 2024 which comes and goes and feels like a sharp pinching pain which is more notable before and after passing a bowel movement and sometimes occurs periodically throughout the day.  She previously passed a normal formed stool every other day and since February or March 2025 she is constipated and passes a solid bowel movement every 3 to 4 days.  No bloody or black stools.  She underwent an EGD and colonoscopy 02/04/2021.  The EGD identified chronic gastritis without evidence of H. pylori.  The colonoscopy identified 2 small tubular adenomatous polyps removed from the colon.  She was advised to repeat a colonoscopy in 7 years.     Latest Ref Rng & Units 09/08/2023    8:31 PM 08/22/2023   10:28 AM 07/02/2023   11:15 AM  CBC  WBC 4.0 - 10.5 K/uL 6.3  7.6  8.0   Hemoglobin 12.0 - 15.0 g/dL 95.6  21.3  08.6   Hematocrit 36.0 - 46.0 % 42.0  45.5  46.7   Platelets 150 - 400 K/uL 265  302  339         Latest Ref Rng & Units 09/08/2023    8:31 PM 08/22/2023   10:28 AM 03/29/2023   11:04 AM  CMP  Glucose 70 - 99 mg/dL 578  92  88   BUN 6 - 20 mg/dL 12  11  26    Creatinine 0.44 - 1.00 mg/dL 4.69  6.29  5.28   Sodium 135 - 145 mmol/L 139  146  143   Potassium 3.5 - 5.1 mmol/L 3.6  3.5  5.1   Chloride 98 - 111 mmol/L 109  107  108   CO2 22 - 32 mmol/L 18  19  22    Calcium 8.9 - 10.3 mg/dL 8.3  8.8  9.2   Total Protein 6.0 - 8.5 g/dL  6.3    Total Bilirubin 0.0 - 1.2 mg/dL  0.4    Alkaline Phos 44 - 121 IU/L  65    AST 0 - 40 IU/L  24    ALT 0 - 32 IU/L  12       PAST GI PROCEDURES:  EGD 02/04/2021: - Esophagogastric landmarks identified. - Normal esophagus otherwise - no Barrett's or esophagitis. - Two scars in the gastric antrum likely from history of PUD, one with some mildly overlying nodular mucosa Biopsied. - Erythematous mucosa in the antrum. - Normal stomach otherwise - biopsies taken to rule out H pylori - Normal duodenal bulb and second portion o -  Normal duodenum   Colonoscopy 02/04/2021: - One 3 mm polyp in the cecum, removed with a cold snare. Resected and retrieved. - One 3 mm polyp in the ascending colon, removed with a cold snare. Resected and retrieved. - Diverticulosis in the sigmoid colon. - Internal hemorrhoids  - 7 Year recall colonoscopy   1. Surgical [P], gastric antrum and gastric body - CHRONIC GASTRITIS. 2. Surgical [P], gastric antrum scar - MILD CHRONIC GASTRITIS AND REACTIVE CHANGES. 3. Surgical [P], colon, ascending and cecum, polyp (2) - TUBULAR ADENOMA (X2). - NEGATIVE FOR HIGH GRADE DYSPLASIA.  Past Medical History:  Diagnosis Date   Abscess 06/23/2021   Achilles tendonitis 01/16/2014   Overview:   Followed by [employer].  Treated with brace and diclofenac.   Acute cystitis 09/12/2013   Last Assessment & Plan:   Formatting of this note might be different from the original.  Symptoms most consistent with acute cystitis     -- Lab  testing: urine sent for culture and sensitivities  -- Antibiotic: proceed now with prescription for cipro 250 BID x 5 days (risks/benefits discussed).  -- Symptom relief: Use phenazopyridine (AZO) as needed.  Maintain hydration.  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  Follow-up with Dr. Jaynie Collins.  Watch for opportunities to increase physical activity within neuromuscular capabilities.   Chronic back pain 02/13/2018   Chronic pain syndrome 02/02/2016   Closed fracture of sacrum with routine healing 12/22/2021   COVID-19 virus infection 06/23/2021   Depression    Ear pain 03/07/2023   Herniated lumbar disc without myelopathy 10/02/2019   History of bacterial endocarditis 07/05/2022   Hypertension    Insomnia 12/08/2021   Kidney stone    Malodorous urine 05/13/2020   Last Assessment & Plan:   Formatting of this note might be different from the original.  Given no dysuria, urgency and a negative UA, this is unlikely a UTI. Patient advised to drink more water.   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   Transaminitis 06/23/2021   Urinary incontinence 06/03/2010   Vertebral osteomyelitis (HCC)    Past Surgical History:  Procedure Laterality Date   ABDOMINAL HYSTERECTOMY     AORTIC VALVE REPLACEMENT N/A 11/07/2021   Procedure: AORTIC VALVE REPLACEMENT USING A On-X AORTIC VALVE.;  Surgeon: Corliss Skains, MD;  Location: [facility] OR;  Service: Open Heart Surgery;  Laterality: N/A;   BACK SURGERY     BUBBLE STUDY  08/31/2021   Procedure: BUBBLE STUDY;  Surgeon: Meriam Sprague, MD;  Location: [facility]  ENDOSCOPY;  Service: Cardiovascular;;   CARPAL TUNNEL RELEASE     CHOLECYSTECTOMY     IR FLUORO GUIDED NEEDLE PLC ASPIRATION/INJECTION LOC  06/23/2021   LITHOTRIPSY     neck fusion     RIGHT/LEFT HEART CATH AND CORONARY ANGIOGRAPHY N/A 10/07/2021   Procedure: RIGHT/LEFT HEART CATH AND CORONARY ANGIOGRAPHY;  Surgeon: Corky Crafts, MD;  Location: [facility] INVASIVE CV LAB;  Service: Cardiovascular;  Laterality: N/A;   TEE WITHOUT CARDIOVERSION N/A 06/28/2021   Procedure: TRANSESOPHAGEAL ECHOCARDIOGRAM (  TEE);  Surgeon: Lewayne Bunting, MD;  Location: [facility] ENDOSCOPY;  Service: Cardiovascular;  Laterality: N/A;   TEE WITHOUT CARDIOVERSION N/A 08/31/2021   Procedure: TRANSESOPHAGEAL ECHOCARDIOGRAM (TEE);  Surgeon: Meriam Sprague, MD;  Location[facility] ENDOSCOPY;  Service: Cardiovascular;  Laterality: N/A;   TEE WITHOUT CARDIOVERSION N/A 11/07/2021   Procedure: TRANSESOPHAGEAL ECHOCARDIOGRAM (TEE);  Surgeon: Corliss Skains, MD;  Location: [facility] OR;  Service: Open Heart Surgery;  Laterality: N/A;   TUBAL LIGATION     Current Outpatient Medications on File Prior to Visit  Medication Sig Dispense Refill   amLODipine (NORVASC) 2.5 MG tablet Take 1 tablet (2.5 mg total) by mouth daily. 90 tablet 3   Ascorbic Acid (VITAMIN C) 500 MG CAPS Take 500 mg by mouth in the morning.     aspirin EC 81 MG tablet Take 1 tablet (81 mg total) by mouth daily. Swallow whole. 30 tablet 11   atorvastatin (LIPITOR) 10 MG tablet TAKE 1 TABLET BY MOUTH EVERY DAY IN THE EVENING 30 tablet 11   benzonatate (TESSALON) 100 MG capsule Take 1-2 capsules (100-200 mg total) by mouth 3 (three) times daily as needed. 30 capsule 0   cetirizine (ZYRTEC) 10 MG tablet Take 1 tablet (10 mg total) by mouth daily. 30 tablet 3   conjugated estrogens (PREMARIN) vaginal cream Place vaginally in the morning and at bedtime. Insert 2grams twice daily for 14 days then 4grams  three times weekly at bedtime 90 g 1   escitalopram (LEXAPRO) 20 MG tablet  Take 1 tablet (20 mg total) by mouth daily. 90 tablet 1   fluticasone (FLONASE) 50 MCG/ACT nasal spray Place 2 sprays into both nostrils daily. 16 g 6   furosemide (LASIX) 20 MG tablet Take 1 tablet (20 mg total) by mouth daily. 90 tablet 3   iron polysaccharides (NIFEREX) 150 MG capsule Take 1 tablet by mouth as needed.     loperamide (IMODIUM) 2 MG capsule Take 1 capsule (2 mg total) by mouth as needed for diarrhea or loose stools. 30 capsule 0   metoprolol tartrate (LOPRESSOR) 25 MG tablet Take 0.5 tablets (12.5 mg total) by mouth 2 (two) times daily. 90 tablet 3   Multiple Vitamin (MULTIVITAMIN WITH MINERALS) TABS tablet Take 1 tablet by mouth in the morning.     naproxen (EC NAPROSYN) 500 MG EC tablet Take 1 tablet (500 mg total) by mouth 2 (two) times daily with a meal. 60 tablet 2   ondansetron (ZOFRAN) 4 MG tablet Take 4 mg by mouth as needed.     pantoprazole (PROTONIX) 40 MG tablet Take 1 tablet (40 mg total) by mouth daily. 90 tablet 3   Potassium (POTASSIMIN PO) Take 1 tablet by mouth daily as needed (with lasix).     tiZANidine (ZANAFLEX) 4 MG tablet Take 4 mg by mouth at bedtime.     Vitamin D, Ergocalciferol, (DRISDOL) 1.25 MG (50000 UNIT) CAPS capsule Take 50,000 Units by mouth once a week.     warfarin (COUMADIN) 5 MG tablet TAKE 1 TO 1 & 1/2 TABLETS DAILY OR AS DIRECTED BY COUMADIN CLINIC 35 tablet 5   No current facility-administered medications on file prior to visit.   Allergies  Allergen Reactions   Sulfur Itching   Baclofen Hives   Dilaudid [Hydromorphone] Itching   Oxycodone Hcl Itching   Percocet [Oxycodone-Acetaminophen] Hives   Semaglutide Nausea And Vomiting   Septra [Sulfamethoxazole-Trimethoprim] Hives   Vibramycin [Doxycycline] Itching   Neurontin [Gabapentin] Rash   Current Medications,  Allergies, Past Medical History, Past Surgical History, Family History and Social History were reviewed in [employer] record.  Review of Systems:    Constitutional: Sweats which she attributes to perimenopausal and HRT. Respiratory: Negative for shortness of breath.   Cardiovascular: Negative for chest pain, palpitations and leg swelling.  Gastrointestinal: See HPI.  Musculoskeletal: Negative for back pain or muscle aches.  Neurological: Negative for dizziness, headaches or paresthesias.   Physical Exam: BP 120/80 (BP Location: Left Wrist, Patient Position: Sitting, Cuff Size: Large)   Pulse 72   Ht 5\' 7"  (1.702 m) Comment: height measured without shoes  Wt 282 lb (127.9 kg)   BMI 44.17 kg/m  General: 53 year old female in no acute distress. Head: Normocephalic and atraumatic. Eyes: No scleral icterus. Conjunctiva pink . Ears: Normal auditory acuity. Mouth: Dentition intact. No ulcers or lesions.  Lungs: Clear throughout to auscultation. Heart: Regular rate and rhythm, no murmur. Abdomen: Soft, nondistended. Moderate tenderness to the RLQ and LLQ, less tender to the central lower abdomen without rebound or guarding. No masses or hepatomegaly. Normal bowel sounds x 4 quadrants.  Rectal: Deferred.  Musculoskeletal: Symmetrical with no gross deformities. Extremities: Mild bilateral LE edema.  Neurological: Alert oriented x 4. No focal deficits.  Psychological: Alert and cooperative. Normal mood and affect  Assessment and Recommendations:  53 year old female sigmoid diverticulosis with lower abdominal pain x 6 months, constipation likely a contributing factor. -CBC, CMP  -CTAP with oral and IV contrast to rule out diverticulitis, BUN/Cr level to be reviewed prior to patient proceeding with IV contrast  -Miralax Q HS, may increase to bid -Drink 8 glasses of water daily -Low fiber diet for the next 1 to 2 days  -Patient instructed to go to the ED if severe abdominal pain occurs   Constipation  -See plan above   History of colon polyps. Colonoscopy 02/04/2021 identified two small tubular adenomatous polyps removed from the  colon. -Next surveillance colonoscopy due 01/2028  Vomiting with and without nausea, started after initiating Wegovy and abated 1 week after stopping Wegovy. - Patient to contact her office if nausea or vomiting recurs  GERD, stable on pantoprazole 40 mg daily.  EGD 01/2021 showed chronic gastritis without evidence of H. pylori. 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

## 2024-01-14 ENCOUNTER — Other Ambulatory Visit (HOSPITAL_BASED_OUTPATIENT_CLINIC_OR_DEPARTMENT_OTHER): Payer: Self-pay | Admitting: Family Medicine

## 2024-01-14 ENCOUNTER — Ambulatory Visit
Admission: RE | Admit: 2024-01-14 | Discharge: 2024-01-14 | Disposition: A | Discharge status: Home / Self Care | Source: Ambulatory Visit | Attending: Nurse Practitioner | Admitting: Nurse Practitioner

## 2024-01-14 DIAGNOSIS — R1031 Right lower quadrant pain: Secondary | ICD-10-CM | POA: Insufficient documentation

## 2024-01-14 DIAGNOSIS — F32A Depression, unspecified: Secondary | ICD-10-CM

## 2024-01-14 DIAGNOSIS — R1032 Left lower quadrant pain: Secondary | ICD-10-CM | POA: Insufficient documentation

## 2024-01-14 MED ORDER — IOHEXOL 300 MG/ML  SOLN
100.0000 mL | Freq: Once | INTRAMUSCULAR | Status: AC | PRN
  Administered 2024-01-14: 100 mL via INTRAVENOUS

## 2024-01-23 ENCOUNTER — Ambulatory Visit: Attending: Cardiovascular Disease

## 2024-01-23 ENCOUNTER — Ambulatory Visit
Admission: RE | Admit: 2024-01-23 | Discharge: 2024-01-23 | Disposition: A | Discharge status: Home / Self Care | Source: Ambulatory Visit | Attending: Family Medicine | Admitting: Family Medicine

## 2024-01-23 DIAGNOSIS — Z7901 Long term (current) use of anticoagulants: Secondary | ICD-10-CM | POA: Insufficient documentation

## 2024-01-23 DIAGNOSIS — Z952 Presence of prosthetic heart valve: Secondary | ICD-10-CM | POA: Insufficient documentation

## 2024-01-23 DIAGNOSIS — I351 Nonrheumatic aortic (valve) insufficiency: Secondary | ICD-10-CM | POA: Diagnosis present

## 2024-01-23 DIAGNOSIS — Z1231 Encounter for screening mammogram for malignant neoplasm of breast: Secondary | ICD-10-CM

## 2024-01-23 LAB — POCT INR: INR: 1.6 — AB (ref 2.0–3.0)

## 2024-01-23 NOTE — Patient Instructions (Signed)
 Description   Continue taking Warfarin 1 tablet (5mg ) daily except 1/2 tablet (2.5mg ) on Mondays. Start holding Warfarin on 02/13/24 prior to procedure on 02/18/24, Resume Warfarin on 02/18/24 if MD states ok to do so, take 1 tablet on 02/18/24, then resume previous dosage regimen.  Keep the number of servings of green leafy vegetables consistent in your diet.  Recheck in 1 week after procedure. (Usually 5 week recheck)  Coumadin  Clinic [phone] call with medication changes and or bleeding.

## 2024-01-28 ENCOUNTER — Encounter (HOSPITAL_BASED_OUTPATIENT_CLINIC_OR_DEPARTMENT_OTHER): Payer: Self-pay | Admitting: Family Medicine

## 2024-01-30 ENCOUNTER — Other Ambulatory Visit (HOSPITAL_BASED_OUTPATIENT_CLINIC_OR_DEPARTMENT_OTHER): Payer: Self-pay | Admitting: Cardiology

## 2024-01-30 ENCOUNTER — Telehealth: Admitting: Physician Assistant

## 2024-01-30 DIAGNOSIS — R3989 Other symptoms and signs involving the genitourinary system: Secondary | ICD-10-CM | POA: Diagnosis not present

## 2024-01-30 DIAGNOSIS — Q2381 Bicuspid aortic valve: Secondary | ICD-10-CM

## 2024-01-30 MED ORDER — CEPHALEXIN 500 MG PO CAPS: 500.0000 mg | ORAL_CAPSULE | Freq: Two times a day (BID) | ORAL | 0 refills | Status: AC

## 2024-01-30 NOTE — Progress Notes (Signed)

## 2024-01-30 NOTE — Progress Notes (Signed)
 I have spent 5 minutes in review of e-visit questionnaire, review and updating patient chart, medical decision making and response to patient.   Piedad Climes, PA-C

## 2024-02-01 ENCOUNTER — Encounter (HOSPITAL_BASED_OUTPATIENT_CLINIC_OR_DEPARTMENT_OTHER): Payer: Self-pay

## 2024-02-03 ENCOUNTER — Telehealth: Admitting: Physician Assistant

## 2024-02-03 ENCOUNTER — Telehealth: Admitting: Family Medicine

## 2024-02-03 DIAGNOSIS — J029 Acute pharyngitis, unspecified: Secondary | ICD-10-CM

## 2024-02-03 DIAGNOSIS — B370 Candidal stomatitis: Secondary | ICD-10-CM | POA: Diagnosis not present

## 2024-02-03 MED ORDER — NYSTATIN 100000 UNIT/ML MT SUSP: 5.0000 mL | Freq: Four times a day (QID) | OROMUCOSAL | 0 refills | Status: AC

## 2024-02-03 NOTE — Progress Notes (Signed)
 Virtual Visit Consent   Mary Chambers, you are scheduled for a virtual visit with a [facility] provider today. Just as with appointments in the office, your consent must be obtained to participate. Your consent will be active for this visit and any virtual visit you may have with one of our providers in the next 365 days. If you have a MyChart account, a copy of this consent can be sent to you electronically.  As this is a virtual visit, video technology does not allow for your provider to perform a traditional examination. This may limit your provider's ability to fully assess your condition. If your provider identifies any concerns that need to be evaluated in person or the need to arrange testing (such as labs, EKG, etc.), we will make arrangements to do so. Although advances in technology are sophisticated, we cannot ensure that it will always work on either your end or our end. If the connection with a video visit is poor, the visit may have to be switched to a telephone visit. With either a video or telephone visit, we are not always able to ensure that we have a secure connection.  By engaging in this virtual visit, you consent to the provision of healthcare and authorize for your insurance to be billed (if applicable) for the services provided during this visit. Depending on your insurance coverage, you may receive a charge related to this service.  I need to obtain your verbal consent now. Are you willing to proceed with your visit today? Mary Chambers has provided verbal consent on 02/03/2024 for a virtual visit (video or telephone). Mary Huger, FNP  Date: 02/03/2024 5:00 PM   Virtual Visit via Video Note   I, Mary Chambers, connected with  Mary Chambers  ([id number], 06-01-1971) on 02/03/24 at  5:00 PM EDT by a video-enabled telemedicine application and verified that I am speaking with the correct person using two identifiers.  Location: Patient: Virtual Visit Location  Patient: Home Provider: Virtual Visit Location Provider: Home Office   I discussed the limitations of evaluation and management by telemedicine and the availability of in person appointments. The patient expressed understanding and agreed to proceed.    History of Present Illness: Mary Chambers is a 53 y.o. who identifies as a female who was assigned female at birth, and is being seen today for thrush, white plaques on tongue and inside mouth. She is on keflex  and says sh always gets thrush with this. Aaron Aas  HPI: HPI  Problems:  Patient Active Problem List   Diagnosis Date Noted   Impaired ambulation 11/30/2023   Hypomagnesemia 11/30/2023   Skin lesion 11/30/2023   Hyperreflexia 07/02/2023   Iron  deficiency anemia 03/29/2023   Vitamin D  deficiency 03/29/2023   Pyelonephritis 03/23/2023   Acute pyelonephritis 03/22/2023   E coli bacteremia 03/22/2023   Wellness examination 07/27/2022   Lumbar pseudoarthrosis 12/22/2021   Idiopathic scoliosis and kyphoscoliosis 12/22/2021   Prediabetes 12/08/2021   Long term (current) use of anticoagulants 11/17/2021   S/P AVR (aortic valve replacement) 11/07/2021   Severe aortic regurgitation 07/27/2021   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   Septic arthritis (HCC) 06/23/2021   Gastroesophageal reflux disease 12/15/2020   Screening for colorectal cancer 12/15/2020   Nausea and vomiting 12/15/2020   History of gastric ulcer 12/15/2020   Thoracic aortic aneurysm (HCC) 05/12/2020   Lumbar spinal stenosis 10/01/2019   Nephrolithiasis 07/29/2019   Prepyloric ulcer 07/17/2018   Sleep apnea 06/25/2018   Pain  in left foot 03/29/2018   Migraines 02/13/2018   Morbidly obese (HCC) 02/13/2018   Synovial cyst of lumbar spine 10/15/2017   Osteoarthritis of knee 07/13/2017   Lumbar disc disease with radiculopathy 08/01/2016   Fatigue 05/04/2016   Headache 05/04/2016   Numbness of foot 05/04/2016   Lumbar stenosis with neurogenic claudication  05/07/2015   Bilateral leg edema 04/21/2015   Renal atrophy, left 01/23/2014   Right lower quadrant pain 12/18/2013   Hypertension, benign 06/03/2010   Anxiety and depression 10/08/2007    Allergies:  Allergies  Allergen Reactions   Sulfur Itching   Baclofen Hives   Dilaudid  [Hydromorphone ] Itching   Oxycodone  Hcl Itching   Percocet [Oxycodone -Acetaminophen ] Hives   Semaglutide  Nausea And Vomiting   Septra [Sulfamethoxazole-Trimethoprim] Hives   Vibramycin [Doxycycline] Itching   Neurontin [Gabapentin] Rash   Medications:  Current Outpatient Medications:    amLODipine  (NORVASC ) 2.5 MG tablet, Take 1 tablet (2.5 mg total) by mouth daily., Disp: 90 tablet, Rfl: 3   Ascorbic Acid  (VITAMIN C ) 500 MG CAPS, Take 500 mg by mouth in the morning., Disp: , Rfl:    aspirin  EC 81 MG tablet, Take 1 tablet (81 mg total) by mouth daily. Swallow whole., Disp: 30 tablet, Rfl: 11   atorvastatin  (LIPITOR) 10 MG tablet, TAKE 1 TABLET BY MOUTH EVERY DAY IN THE EVENING, Disp: 30 tablet, Rfl: 11   benzonatate  (TESSALON ) 100 MG capsule, Take 1-2 capsules (100-200 mg total) by mouth 3 (three) times daily as needed., Disp: 30 capsule, Rfl: 0   cephALEXin  (KEFLEX ) 500 MG capsule, Take 1 capsule (500 mg total) by mouth 2 (two) times daily for 7 days., Disp: 14 capsule, Rfl: 0   cetirizine  (ZYRTEC ) 10 MG tablet, Take 1 tablet (10 mg total) by mouth daily., Disp: 30 tablet, Rfl: 3   conjugated estrogens  (PREMARIN ) vaginal cream, Place vaginally in the morning and at bedtime. Insert 2grams twice daily for 14 days then 4grams  three times weekly at bedtime, Disp: 90 g, Rfl: 1   escitalopram  (LEXAPRO ) 20 MG tablet, TAKE 1 TABLET BY MOUTH EVERY DAY, Disp: 90 tablet, Rfl: 1   fluticasone  (FLONASE ) 50 MCG/ACT nasal spray, Place 2 sprays into both nostrils daily., Disp: 16 g, Rfl: 6   furosemide  (LASIX ) 20 MG tablet, Take 1 tablet (20 mg total) by mouth daily., Disp: 90 tablet, Rfl: 3   iron  polysaccharides (NIFEREX)  150 MG capsule, Take 1 tablet by mouth as needed., Disp: , Rfl:    loperamide  (IMODIUM ) 2 MG capsule, Take 1 capsule (2 mg total) by mouth as needed for diarrhea or loose stools., Disp: 30 capsule, Rfl: 0   metoprolol  tartrate (LOPRESSOR ) 25 MG tablet, Take 0.5 tablets (12.5 mg total) by mouth 2 (two) times daily., Disp: 90 tablet, Rfl: 3   Multiple Vitamin (MULTIVITAMIN WITH MINERALS) TABS tablet, Take 1 tablet by mouth in the morning., Disp: , Rfl:    naproxen  (EC NAPROSYN ) 500 MG EC tablet, Take 1 tablet (500 mg total) by mouth 2 (two) times daily with a meal., Disp: 60 tablet, Rfl: 2   ondansetron  (ZOFRAN ) 4 MG tablet, Take 4 mg by mouth as needed., Disp: , Rfl:    pantoprazole  (PROTONIX ) 40 MG tablet, Take 1 tablet (40 mg total) by mouth daily., Disp: 90 tablet, Rfl: 3   Potassium (POTASSIMIN PO), Take 1 tablet by mouth daily as needed (with lasix )., Disp: , Rfl:    tiZANidine  (ZANAFLEX ) 4 MG tablet, Take 4 mg by mouth at bedtime.,  Disp: , Rfl:    Vitamin D , Ergocalciferol , (DRISDOL ) 1.25 MG (50000 UNIT) CAPS capsule, Take 50,000 Units by mouth once a week., Disp: , Rfl:    warfarin (COUMADIN ) 5 MG tablet, Take 1/2 to 1 tablet daily or as directed by Coumadin  Clinic, Disp: 30 tablet, Rfl: 3  Observations/Objective: Patient is well-developed, well-nourished in no acute distress.  Resting comfortably  at home.  Head is normocephalic, atraumatic.  No labored breathing.  Speech is clear and coherent with logical content.  Patient is alert and oriented at baseline.    Assessment and Plan: 1. Thrush (Primary)  Increase fluids, follow up with pcp as needed.  Follow Up Instructions: I discussed the assessment and treatment plan with the patient. The patient was provided an opportunity to ask questions and all were answered. The patient agreed with the plan and demonstrated an understanding of the instructions.  A copy of instructions were sent to the patient via MyChart unless otherwise noted  below.     The patient was advised to call back or seek an in-person evaluation if the symptoms worsen or if the condition fails to improve as anticipated.    Hughie Melroy, FNP

## 2024-02-03 NOTE — Patient Instructions (Signed)

## 2024-02-03 NOTE — Progress Notes (Signed)
   Thank you for the details you included in the comment boxes. Those details are very helpful in determining the best course of treatment for you and help us  to provide the best care.Because of your reported symptoms, we recommend that you schedule a Virtual Urgent Care video visit in order for the provider to better assess what is going on.  The provider will be able to give you a more accurate diagnosis and treatment plan if we can more freely discuss your symptoms and with the addition of a virtual examination.   If you change your visit to a video visit, we will bill your insurance (similar to an office visit) and you will not be charged for this e-Visit. You will be able to stay at home and speak with the first available [facility] advanced practice provider. The link to do a video visit is in the drop down Menu tab of your Welcome screen in MyChart.

## 2024-02-04 NOTE — Telephone Encounter (Signed)
 Patient is scheduled to see [facility] on 02/07/24 at 9 am.

## 2024-02-06 ENCOUNTER — Ambulatory Visit: Admitting: Podiatry

## 2024-02-06 ENCOUNTER — Encounter (HOSPITAL_BASED_OUTPATIENT_CLINIC_OR_DEPARTMENT_OTHER): Payer: Self-pay

## 2024-02-06 ENCOUNTER — Encounter (HOSPITAL_BASED_OUTPATIENT_CLINIC_OR_DEPARTMENT_OTHER): Payer: Self-pay | Admitting: Family Medicine

## 2024-02-06 ENCOUNTER — Encounter: Payer: Self-pay | Admitting: Podiatry

## 2024-02-06 DIAGNOSIS — M722 Plantar fascial fibromatosis: Secondary | ICD-10-CM

## 2024-02-06 DIAGNOSIS — B351 Tinea unguium: Secondary | ICD-10-CM | POA: Diagnosis not present

## 2024-02-06 DIAGNOSIS — M62461 Contracture of muscle, right lower leg: Secondary | ICD-10-CM | POA: Diagnosis not present

## 2024-02-06 DIAGNOSIS — M7661 Achilles tendinitis, right leg: Secondary | ICD-10-CM | POA: Diagnosis not present

## 2024-02-06 DIAGNOSIS — M7662 Achilles tendinitis, left leg: Secondary | ICD-10-CM

## 2024-02-06 DIAGNOSIS — M62462 Contracture of muscle, left lower leg: Secondary | ICD-10-CM

## 2024-02-06 NOTE — Patient Instructions (Signed)
 Call Phone: [phone] to schedule PT  Plantar Fasciitis (Heel Spur Syndrome) with Rehab The plantar fascia is a fibrous, ligament-like, soft-tissue structure that spans the bottom of the foot. Plantar fasciitis is a condition that causes pain in the foot due to inflammation of the tissue. SYMPTOMS  Pain and tenderness on the underneath side of the foot. Pain that worsens with standing or walking. CAUSES  Plantar fasciitis is caused by irritation and injury to the plantar fascia on the underneath side of the foot. Common mechanisms of injury include: Direct trauma to bottom of the foot. Damage to a small nerve that runs under the foot where the main fascia attaches to the heel bone. Stress placed on the plantar fascia due to bone spurs. RISK INCREASES WITH:  Activities that place stress on the plantar fascia (running, jumping, pivoting, or cutting). Poor strength and flexibility. Improperly fitted shoes. Tight calf muscles. Flat feet. Failure to warm-up properly before activity. Obesity. PREVENTION Warm up and stretch properly before activity. Allow for adequate recovery between workouts. Maintain physical fitness: Strength, flexibility, and endurance. Cardiovascular fitness. Maintain a health body weight. Avoid stress on the plantar fascia. Wear properly fitted shoes, including arch supports for individuals who have flat feet.  PROGNOSIS  If treated properly, then the symptoms of plantar fasciitis usually resolve without surgery. However, occasionally surgery is necessary.  RELATED COMPLICATIONS  Recurrent symptoms that may result in a chronic condition. Problems of the lower back that are caused by compensating for the injury, such as limping. Pain or weakness of the foot during push-off following surgery. Chronic inflammation, scarring, and partial or complete fascia tear, occurring more often from repeated injections.  TREATMENT  Treatment initially involves the use  of ice and medication to help reduce pain and inflammation. The use of strengthening and stretching exercises may help reduce pain with activity, especially stretches of the Achilles tendon. These exercises may be performed at home or with a therapist. Your caregiver may recommend that you use heel cups of arch supports to help reduce stress on the plantar fascia. Occasionally, corticosteroid injections are given to reduce inflammation. If symptoms persist for greater than 6 months despite non-surgical (conservative), then surgery may be recommended.   MEDICATION  If pain medication is necessary, then nonsteroidal anti-inflammatory medications, such as aspirin  and ibuprofen , or other minor pain relievers, such as acetaminophen , are often recommended. Do not take pain medication within 7 days before surgery. Prescription pain relievers may be given if deemed necessary by your caregiver. Use only as directed and only as much as you need. Corticosteroid injections may be given by your caregiver. These injections should be reserved for the most serious cases, because they may only be given a certain number of times.  HEAT AND COLD Cold treatment (icing) relieves pain and reduces inflammation. Cold treatment should be applied for 10 to 15 minutes every 2 to 3 hours for inflammation and pain and immediately after any activity that aggravates your symptoms. Use ice packs or massage the area with a piece of ice (ice massage). Heat treatment may be used prior to performing the stretching and strengthening activities prescribed by your caregiver, physical therapist, or athletic trainer. Use a heat pack or soak the injury in warm water .  SEEK IMMEDIATE MEDICAL CARE IF: Treatment seems to offer no benefit, or the condition worsens. Any medications produce adverse side effects.  EXERCISES- RANGE OF MOTION (ROM) AND STRETCHING EXERCISES - Plantar Fasciitis (Heel Spur Syndrome) These exercises may help you  when  beginning to rehabilitate your injury. Your symptoms may resolve with or without further involvement from your physician, physical therapist or athletic trainer. While completing these exercises, remember:  Restoring tissue flexibility helps normal motion to return to the joints. This allows healthier, less painful movement and activity. An effective stretch should be held for at least 30 seconds. A stretch should never be painful. You should only feel a gentle lengthening or release in the stretched tissue.  RANGE OF MOTION - Toe Extension, Flexion Sit with your right / left leg crossed over your opposite knee. Grasp your toes and gently pull them back toward the top of your foot. You should feel a stretch on the bottom of your toes and/or foot. Hold this stretch for 10 seconds. Now, gently pull your toes toward the bottom of your foot. You should feel a stretch on the top of your toes and or foot. Hold this stretch for 10 seconds. Repeat  times. Complete this stretch 3 times per day.   RANGE OF MOTION - Ankle Dorsiflexion, Active Assisted Remove shoes and sit on a chair that is preferably not on a carpeted surface. Place right / left foot under knee. Extend your opposite leg for support. Keeping your heel down, slide your right / left foot back toward the chair until you feel a stretch at your ankle or calf. If you do not feel a stretch, slide your bottom forward to the edge of the chair, while still keeping your heel down. Hold this stretch for 10 seconds. Repeat 3 times. Complete this stretch 2 times per day.   STRETCH  Gastroc, Standing Place hands on wall. Extend right / left leg, keeping the front knee somewhat bent. Slightly point your toes inward on your back foot. Keeping your right / left heel on the floor and your knee straight, shift your weight toward the wall, not allowing your back to arch. You should feel a gentle stretch in the right / left calf. Hold this position for 10  seconds. Repeat 3 times. Complete this stretch 2 times per day.  STRETCH  Soleus, Standing Place hands on wall. Extend right / left leg, keeping the other knee somewhat bent. Slightly point your toes inward on your back foot. Keep your right / left heel on the floor, bend your back knee, and slightly shift your weight over the back leg so that you feel a gentle stretch deep in your back calf. Hold this position for 10 seconds. Repeat 3 times. Complete this stretch 2 times per day.  STRETCH  Gastrocsoleus, Standing  Note: This exercise can place a lot of stress on your foot and ankle. Please complete this exercise only if specifically instructed by your caregiver.  Place the ball of your right / left foot on a step, keeping your other foot firmly on the same step. Hold on to the wall or a rail for balance. Slowly lift your other foot, allowing your body weight to press your heel down over the edge of the step. You should feel a stretch in your right / left calf. Hold this position for 10 seconds. Repeat this exercise with a slight bend in your right / left knee. Repeat 3 times. Complete this stretch 2 times per day.   STRENGTHENING EXERCISES - Plantar Fasciitis (Heel Spur Syndrome)  These exercises may help you when beginning to rehabilitate your injury. They may resolve your symptoms with or without further involvement from your physician, physical therapist or athletic trainer.  While completing these exercises, remember:  Muscles can gain both the endurance and the strength needed for everyday activities through controlled exercises. Complete these exercises as instructed by your physician, physical therapist or athletic trainer. Progress the resistance and repetitions only as guided.  STRENGTH - Towel Curls Sit in a chair positioned on a non-carpeted surface. Place your foot on a towel, keeping your heel on the floor. Pull the towel toward your heel by only curling your toes. Keep your  heel on the floor. Repeat 3 times. Complete this exercise 2 times per day.  STRENGTH - Ankle Inversion Secure one end of a rubber exercise band/tubing to a fixed object (table, pole). Loop the other end around your foot just before your toes. Place your fists between your knees. This will focus your strengthening at your ankle. Slowly, pull your big toe up and in, making sure the band/tubing is positioned to resist the entire motion. Hold this position for 10 seconds. Have your muscles resist the band/tubing as it slowly pulls your foot back to the starting position. Repeat 3 times. Complete this exercises 2 times per day.  Document Released: 09/18/2005 Document Revised: 12/11/2011 Document Reviewed: 12/31/2008 [facility] Patient Information 2014 [place], [place].

## 2024-02-06 NOTE — Telephone Encounter (Signed)
 Please review and advise.

## 2024-02-06 NOTE — Progress Notes (Signed)
  Subjective:  Patient ID: Mary Chambers, female    DOB: 09/04/1971,  MRN: [id number]  Chief Complaint  Patient presents with   Plantar Fasciitis    "Both my heels hurt."   Nail Problem    "The doctor removed a couple of my toenails and they grew back.  They're really thin and peel off."    53 y.o. female presents with the above complaint. History confirmed with patient. She returns for follow-up with new issue she has pain on the bottom and back of both heels.  She also has had regrowth of the second toenails that were previously removed and she would like to have them removed again permanently.  Objective:  Physical Exam: warm, good capillary refill, no trophic changes or ulcerative lesions, normal DP and PT pulses, normal sensory exam, and dystrophic mycotic second toenail bilateral. Left Foot: point tenderness over the heel pad, tenderness at Achilles tendon insertion, and gastrocnemius equinus is noted with a positive silverskiold test Right Foot: point tenderness over the heel pad, tenderness at Achilles tendon insertion, and gastrocnemius equinus is noted with a positive silverskiold test  Assessment:   1. Dermatophytosis of nail   2. Plantar fasciitis of left foot   3. Plantar fasciitis of right foot   4. Achilles tendinitis of both lower extremities   5. Gastrocnemius equinus of left lower extremity   6. Gastrocnemius equinus of right lower extremity      Plan:  Patient was evaluated and treated and all questions answered.  Discussed the etiology and treatment options for Achilles tendinitis and plantar fasciitis including stretching, formal physical therapy with an eccentric exercises therapy plan, supportive shoegears such as a running shoe or sneaker, heel lifts, topical and oral medications.  We also discussed that I do not routinely perform injections in the area of the Achilles insertion because of the risk of an increased damage or rupture of the tendon, however  this would be a good option for her plantar fasciitis especially since she cannot take NSAIDs with her warfarin use.    -Educated on stretching and icing of the affected limb. -Referral placed to physical therapy. -Corticosteroid injection to plantar heel performed as noted below  After sterile prep with povidone-iodine solution and alcohol, the bilateral heel was injected with 0.5cc 2% xylocaine  plain, 0.5cc 0.5% marcaine  plain, 20mg  triamcinolone  acetonide, and 4mg  dexamethasone  was injected along the medial plantar fascia at the insertion on the plantar calcaneus. The patient tolerated the procedure well without complication.  She also like to have the second toenails removed permanently once again.  We will plan to do this on the right side at her next visit she would like to do 1 foot at a time which is understandable.  Return in about 8 weeks (around 04/02/2024) for recheck plantar fasciitis, remove R 2nd toenail.

## 2024-02-07 ENCOUNTER — Encounter (HOSPITAL_BASED_OUTPATIENT_CLINIC_OR_DEPARTMENT_OTHER): Payer: Self-pay

## 2024-02-07 ENCOUNTER — Telehealth: Payer: Self-pay

## 2024-02-07 ENCOUNTER — Telehealth: Payer: Self-pay | Admitting: *Deleted

## 2024-02-07 ENCOUNTER — Encounter (HOSPITAL_BASED_OUTPATIENT_CLINIC_OR_DEPARTMENT_OTHER): Payer: Self-pay | Admitting: Family Medicine

## 2024-02-07 ENCOUNTER — Telehealth (HOSPITAL_BASED_OUTPATIENT_CLINIC_OR_DEPARTMENT_OTHER): Payer: Self-pay | Admitting: Licensed Clinical Social Worker

## 2024-02-07 NOTE — Telephone Encounter (Signed)
   Pre-operative Risk Assessment    Patient Name: Mary Chambers  DOB: 1970/12/09 MRN: [id number]   Date of last office visit: 10/17/2023 Date of next office visit: 05/16/2024   Request for Surgical Clearance    Procedure:  HERNIA SURGERY  Date of Surgery:  Clearance TBD                                Surgeon:  Teddie Favre, MD Surgeon's Group or Practice Name:  [facility] Phone number:  [phone] Fax number:  [phone]   Type of Clearance Requested:   - Medical  - Pharmacy:  Hold Aspirin  and Warfarin (Coumadin ) NOT INDICATED   Type of Anesthesia:  General    Additional requests/questions:    Berenda Breaker   02/07/2024, 10:10 AM

## 2024-02-07 NOTE — Telephone Encounter (Signed)
**Note De-Identified Mary Chambers Obfuscation** Per the pts MYCHART message from 5/7, I started a CPAP Titration PA through [facility]. PA is currently pending review Tracking #: [id number]

## 2024-02-07 NOTE — Telephone Encounter (Signed)
 H&V Care Navigation CSW Progress Note  Clinical Social Worker received a call from pt to inquire if we could provide an FL2 or if that would be something that PCP team would be able to complete. I shared that since an FL2 does make note of mobility and other non cardiology related systems. Should she have any issue with PCP team completing this information then she was encouraged to reach back out.  Patient is participating in a Managed Medicaid Plan:  Yes- [employer]   SDOH Screenings   Food Insecurity: Food Insecurity Present (11/26/2023)  Housing: High Risk (11/26/2023)  Transportation Needs: No Transportation Needs (11/26/2023)  Utilities: Not At Risk (03/22/2023)  Alcohol Screen: Low Risk  (11/26/2023)  Depression (PHQ2-9): Low Risk  (11/30/2023)  Financial Resource Strain: Medium Risk (11/26/2023)  Physical Activity: Insufficiently Active (11/26/2023)  Social Connections: Socially Integrated (11/26/2023)  Stress: Stress Concern Present (11/26/2023)  Tobacco Use: Low Risk  (02/06/2024)  Health Literacy: Adequate Health Literacy (10/18/2023)    Nathen Balder, MSW, LCSW Clinical Social Worker II [facility] Heart/Vascular Care Navigation  [phone]- work cell phone (preferred)

## 2024-02-08 ENCOUNTER — Encounter (HOSPITAL_BASED_OUTPATIENT_CLINIC_OR_DEPARTMENT_OTHER): Payer: Self-pay | Admitting: Family Medicine

## 2024-02-08 NOTE — Telephone Encounter (Signed)
 Please advise holding Coumadin  prior to hernia repair  Thank you!  DW

## 2024-02-10 ENCOUNTER — Telehealth: Admitting: Physician Assistant

## 2024-02-10 ENCOUNTER — Encounter: Payer: Self-pay | Admitting: Physician Assistant

## 2024-02-10 DIAGNOSIS — N3000 Acute cystitis without hematuria: Secondary | ICD-10-CM | POA: Diagnosis not present

## 2024-02-10 MED ORDER — NITROFURANTOIN MONOHYD MACRO 100 MG PO CAPS
100.0000 mg | ORAL_CAPSULE | Freq: Two times a day (BID) | ORAL | 0 refills | Status: AC
Start: 2024-02-10 — End: 2024-02-15

## 2024-02-10 NOTE — Progress Notes (Signed)
E-Visit for Urinary Problems ? ?We are sorry that you are not feeling well.  Here is how we plan to help! ? ?Based on what you shared with me it looks like you most likely have a simple urinary tract infection. ? ?A UTI (Urinary Tract Infection) is a bacterial infection of the bladder. ? ?Most cases of urinary tract infections are simple to treat but a key part of your care is to encourage you to drink plenty of fluids and watch your symptoms carefully. ? ?I have prescribed MacroBid 100 mg twice a day for 5 days.  Your symptoms should gradually improve. Call us if the burning in your urine worsens, you develop worsening fever, back pain or pelvic pain or if your symptoms do not resolve after completing the antibiotic. ? ?Urinary tract infections can be prevented by drinking plenty of water to keep your body hydrated.  Also be sure when you wipe, wipe from front to back and don't hold it in!  If possible, empty your bladder every 4 hours. ? ?HOME CARE ?Drink plenty of fluids ?Compete the full course of the antibiotics even if the symptoms resolve ?Remember, when you need to go?go. Holding in your urine can increase the likelihood of getting a UTI! ?GET HELP RIGHT AWAY IF: ?You cannot urinate ?You get a high fever ?Worsening back pain occurs ?You see blood in your urine ?You feel sick to your stomach or throw up ?You feel like you are going to pass out ? ?MAKE SURE YOU  ?Understand these instructions. ?Will watch your condition. ?Will get help right away if you are not doing well or get worse. ? ? ?Thank you for choosing an e-visit. ? ?Your e-visit answers were reviewed by a board certified advanced clinical practitioner to complete your personal care plan. Depending upon the condition, your plan could have included both over the counter or prescription medications. ? ?Please review your pharmacy choice. Make sure the pharmacy is open so you can pick up prescription now. If there is a problem, you may contact your  provider through MyChart messaging and have the prescription routed to another pharmacy.  Your safety is important to us. If you have drug allergies check your prescription carefully.  ? ?For the next 24 hours you can use MyChart to ask questions about today's visit, request a non-urgent call back, or ask for a work or school excuse. ?You will get an email in the next two days asking about your experience. I hope that your e-visit has been valuable and will speed your recovery. ? ?I have spent 5 minutes in review of e-visit questionnaire, review and updating patient chart, medical decision making and response to patient.  ? ?Zainab Crumrine S Mayers, PA-C ? ? ? ? ?

## 2024-02-11 ENCOUNTER — Telehealth: Payer: Self-pay

## 2024-02-11 NOTE — Telephone Encounter (Signed)
   Name: Mary Chambers  DOB: 09/12/1971  MRN: [id number]  Primary Cardiologist: Sheryle Donning, MD   Preoperative team, please contact this patient and set up a phone call appointment for further preoperative risk assessment. Please obtain consent and complete medication review. Thank you for your help.  I confirm that guidance regarding antiplatelet and oral anticoagulation therapy has been completed and, if necessary, noted below.  Per Pharm D, patient may hold Coumadin  for 5 days prior to procedure.  Patient will not need bridging with Lovenox  around procedure.   I also confirmed the patient resides in the state of [place] . As per [facility] Medical Board telemedicine laws, the patient must reside in the state in which the provider is licensed.   Morey Ar, NP 02/11/2024, 2:45 PM [facility]

## 2024-02-11 NOTE — Telephone Encounter (Signed)
 Patient with diagnosis of mechanical aortic valve on warfarin for anticoagulation.    Procedure: hernia surgery Date of procedure: TBD  Per office protocol, patient can hold warfarin for 5 days prior to procedure.    Patient will NOT need bridging with Lovenox  (enoxaparin ) around procedure.  **This guidance is not considered finalized until pre-operative APP has relayed final recommendations.**

## 2024-02-11 NOTE — Telephone Encounter (Signed)
 S/W pt and is scheduled for TELE appt 02/15/24. Med Rec and consent done

## 2024-02-11 NOTE — Telephone Encounter (Signed)
 Med Rec and consent done      Patient Consent for Virtual Visit        Mary Chambers has provided verbal consent on 02/11/2024 for a virtual visit (video or telephone).   CONSENT FOR VIRTUAL VISIT FOR:  Mary Chambers  By participating in this virtual visit I agree to the following:  I hereby voluntarily request, consent and authorize [facility] and its employed or contracted physicians, physician assistants, nurse practitioners or other licensed health care professionals (the Practitioner), to provide me with telemedicine health care services (the "Services") as deemed necessary by the treating Practitioner. I acknowledge and consent to receive the Services by the Practitioner via telemedicine. I understand that the telemedicine visit will involve communicating with the Practitioner through live audiovisual communication technology and the disclosure of certain medical information by electronic transmission. I acknowledge that I have been given the opportunity to request an in-person assessment or other available alternative prior to the telemedicine visit and am voluntarily participating in the telemedicine visit.  I understand that I have the right to withhold or withdraw my consent to the use of telemedicine in the course of my care at any time, without affecting my right to future care or treatment, and that the Practitioner or I may terminate the telemedicine visit at any time. I understand that I have the right to inspect all information obtained and/or recorded in the course of the telemedicine visit and may receive copies of available information for a reasonable fee.  I understand that some of the potential risks of receiving the Services via telemedicine include:  Delay or interruption in medical evaluation due to technological equipment failure or disruption; Information transmitted may not be sufficient (e.g. poor resolution of images) to allow for appropriate  medical decision making by the Practitioner; and/or  In rare instances, security protocols could fail, causing a breach of personal health information.  Furthermore, I acknowledge that it is my responsibility to provide information about my medical history, conditions and care that is complete and accurate to the Chambers of my ability. I acknowledge that Practitioner's advice, recommendations, and/or decision may be based on factors not within their control, such as incomplete or inaccurate data provided by me or distortions of diagnostic images or specimens that may result from electronic transmissions. I understand that the practice of medicine is not an exact science and that Practitioner makes no warranties or guarantees regarding treatment outcomes. I acknowledge that a copy of this consent can be made available to me via my patient portal [facility] MyChart), or I can request a printed copy by calling the office of [facility].    I understand that my insurance will be billed for this visit.   I have read or had this consent read to me. I understand the contents of this consent, which adequately explains the benefits and risks of the Services being provided via telemedicine.  I have been provided ample opportunity to ask questions regarding this consent and the Services and have had my questions answered to my satisfaction. I give my informed consent for the services to be provided through the use of telemedicine in my medical care

## 2024-02-12 NOTE — Telephone Encounter (Addendum)
**Note De-Identified Chrishaun Sasso Obfuscation** Letter received Mary Chambers fax from [facility] stating that they have approved the pts CPAP Titration from 03/17/2024-06/17/2024. Reference #: [id number]   I have transferred the order to the sleep lab so they can contact her to schedule the test.  I have notified the pt of this approval Mary Chambers her [facility] message from 02/06/24.

## 2024-02-15 ENCOUNTER — Ambulatory Visit: Attending: Cardiology | Admitting: Emergency Medicine

## 2024-02-15 DIAGNOSIS — Z01810 Encounter for preprocedural cardiovascular examination: Secondary | ICD-10-CM | POA: Diagnosis present

## 2024-02-15 NOTE — Progress Notes (Signed)
 Virtual Visit via Telephone Note   Because of Mary Chambers co-morbid illnesses, she is at least at moderate risk for complications without adequate follow up.  This format is felt to be most appropriate for this patient at this time.  Due to technical limitations with video connection (technology), today's appointment will be conducted as an audio only telehealth visit, and Mary Chambers verbally agreed to proceed in this manner.   All issues noted in this document were discussed and addressed.  No physical exam could be performed with this format.  Evaluation Performed:  Preoperative cardiovascular risk assessment _____________   Date:  02/15/2024   Patient ID:  Mary Chambers, DOB 30-Nov-1970, MRN [id number] Patient Location:  Home Provider location:   Office  Primary Care Provider:  [place], Alonza Jansky, MD Primary Cardiologist:  Sheryle Donning, MD  Chief Complaint / Patient Profile   53 y.o. y/o female with a h/o aortic valve endocarditis, severe aortic regurgitation, bicuspid aortic valve s/p AVR on 11/2021, hypertension, lower extremity edema, morbid obesity, obstructive sleep apnea who is pending hernia surgery on date TBD with [facility] and presents today for telephonic preoperative cardiovascular risk assessment.  History of Present Illness    Mary Chambers is a 53 y.o. female who presents via audio/video conferencing for a telehealth visit today.  Pt was last seen in cardiology clinic on 10/17/2023 by Dr. Veryl Gottron.  At that time Mary Chambers was doing well.  The patient is now pending procedure as outlined above. Since her last visit, she denies chest pain, shortness of breath, lower extremity edema, fatigue, palpitations, melena, hematuria, hemoptysis, diaphoresis, weakness, presyncope, syncope, orthopnea, and PND.  Today patient is doing well overall.  She is without any acute cardiovascular concerns or complaints.  She reports  that she stays fairly active without any anginal symptoms.  She is easily able to complete greater than 4 METS.  Past Medical History    Past Medical History:  Diagnosis Date   Abscess 06/23/2021   Achilles tendonitis 01/16/2014   Overview:   Followed by [employer].  Treated with brace and diclofenac .   Acute cystitis 09/12/2013   Last Assessment & Plan:   Formatting of this note might be different from the original.  Symptoms most consistent with acute cystitis     -- Lab testing: urine sent for culture and sensitivities  -- Antibiotic: proceed now with prescription for cipro  250 BID x 5 days (risks/benefits discussed).  -- Symptom relief: Use phenazopyridine (AZO) as needed.  Maintain hydration.  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  Follow-up with Dr. Marne Sings.  Watch for opportunities to increase physical activity within neuromuscular capabilities.   Chronic back pain 02/13/2018   Chronic pain syndrome 02/02/2016   Closed fracture of sacrum with routine healing 12/22/2021   COVID-19 virus infection 06/23/2021   Depression    Ear pain 03/07/2023   Herniated lumbar disc without myelopathy 10/02/2019   History of bacterial endocarditis 07/05/2022   Hypertension    Insomnia 12/08/2021   Kidney stone  Malodorous urine 05/13/2020   Last Assessment & Plan:   Formatting of this note might be different from the original.  Given no dysuria, urgency and a negative UA, this is unlikely a UTI. Patient advised to drink more water .   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   Transaminitis 06/23/2021    Urinary incontinence 06/03/2010   Vertebral osteomyelitis (HCC)    Past Surgical History:  Procedure Laterality Date   ABDOMINAL HYSTERECTOMY     AORTIC VALVE REPLACEMENT N/A 11/07/2021   Procedure: AORTIC VALVE REPLACEMENT USING A On-X AORTIC VALVE.;  Surgeon: Hilarie Lovely, MD;  Location: [facility] OR;  Service: Open Heart Surgery;  Laterality: N/A;   BACK SURGERY     BUBBLE STUDY  08/31/2021   Procedure: BUBBLE STUDY;  Surgeon: Sonny Dust, MD;  Location: [facility] ENDOSCOPY;  Service: Cardiovascular;;   CARPAL TUNNEL RELEASE     CHOLECYSTECTOMY     IR FLUORO GUIDED NEEDLE PLC ASPIRATION/INJECTION LOC  06/23/2021   LITHOTRIPSY     neck fusion     RIGHT/LEFT HEART CATH AND CORONARY ANGIOGRAPHY N/A 10/07/2021   Procedure: RIGHT/LEFT HEART CATH AND CORONARY ANGIOGRAPHY;  Surgeon: Lucendia Rusk, MD;  Location: [facility] INVASIVE CV LAB;  Service: Cardiovascular;  Laterality: N/A;   TEE WITHOUT CARDIOVERSION N/A 06/28/2021   Procedure: TRANSESOPHAGEAL ECHOCARDIOGRAM (TEE);  Surgeon: Lenise Quince, MD;  Location: [facility] ENDOSCOPY;  Service: Cardiovascular;  Laterality: N/A;   TEE WITHOUT CARDIOVERSION N/A 08/31/2021   Procedure: TRANSESOPHAGEAL ECHOCARDIOGRAM (TEE);  Surgeon: Sonny Dust, MD;  Location: [facility]OSCOPY;  Service: Cardiovascular;  Laterality: N/A;   TEE WITHOUT CARDIOVERSION N/A 11/07/2021   Procedure: TRANSESOPHAGEAL ECHOCARDIOGRAM (TEE);  Surgeon: Hilarie Lovely, MD;  Location: [facility] OR;  Service: Open Heart Surgery;  Laterality: N/A;   TUBAL LIGATION      Allergies  Allergies  Allergen Reactions   Sulfur Itching   Baclofen Hives   Dilaudid  [Hydromorphone ] Itching   Oxycodone  Hcl Itching   Percocet [Oxycodone -Acetaminophen ] Hives   Semaglutide  Nausea And Vomiting   Septra [Sulfamethoxazole-Trimethoprim] Hives   Vibramycin [Doxycycline] Itching   Neurontin [Gabapentin] Rash    Home Medications    Prior to Admission medications   Medication Sig Start  Date End Date Taking? Authorizing Provider  amLODipine  (NORVASC ) 2.5 MG tablet Take 1 tablet (2.5 mg total) by mouth daily. 05/22/23   Clearnce Curia, NP  Ascorbic Acid  (VITAMIN C ) 500 MG CAPS Take 500 mg by mouth in the morning.    [provider]  aspirin  EC 81 MG tablet Take 1 tablet (81 mg total) by mouth daily. Swallow whole. 08/15/22   Sheryle Donning, MD  atorvastatin  (LIPITOR) 10 MG tablet TAKE 1 TABLET BY MOUTH EVERY DAY IN THE EVENING 07/26/23   Sheryle Donning, MD  benzonatate  (TESSALON ) 100 MG capsule Take 1-2 capsules (100-200 mg total) by mouth 3 (three) times daily as needed. 01/07/24   Angelia Kelp, PA-C  cetirizine  (ZYRTEC ) 10 MG tablet Take 1 tablet (10 mg total) by mouth daily. 11/23/23   de [place], Raymond J, MD  conjugated estrogens  (PREMARIN ) vaginal cream Place vaginally in the morning and at bedtime. Insert 2grams twice daily for 14 days then 4grams  three times weekly at bedtime 12/06/23   Lo, Juvenal Opoka, CNM  escitalopram  (LEXAPRO ) 20 MG tablet TAKE 1 TABLET BY MOUTH EVERY DAY 01/14/24   [place], Alonza Jansky, MD  fluticasone  (FLONASE ) 50 MCG/ACT nasal spray Place  2 sprays into both nostrils daily. 11/30/23   [place], Alonza Jansky, MD  furosemide  (LASIX ) 20 MG tablet Take 1 tablet (20 mg total) by mouth daily. 08/15/22   Sheryle Donning, MD  iron  polysaccharides (NIFEREX) 150 MG capsule Take 1 tablet by mouth as needed.    [provider]  loperamide  (IMODIUM ) 2 MG capsule Take 1 capsule (2 mg total) by mouth as needed for diarrhea or loose stools. 07/01/21   Samtani, Jai-Gurmukh, MD  metoprolol  tartrate (LOPRESSOR ) 25 MG tablet Take 0.5 tablets (12.5 mg total) by mouth 2 (two) times daily. 08/06/23   Sheryle Donning, MD  Multiple Vitamin (MULTIVITAMIN WITH MINERALS) TABS tablet Take 1 tablet by mouth in the morning.    [provider]  naproxen  (EC NAPROSYN ) 500 MG EC tablet Take 1 tablet (500 mg total) by mouth 2 (two) times  daily with a meal. 10/26/23 10/25/24  Evans, Alexandra, PA-C  nitrofurantoin , macrocrystal-monohydrate, (MACROBID ) 100 MG capsule Take 1 capsule (100 mg total) by mouth 2 (two) times daily for 5 days. 02/10/24 02/15/24  Mayers, Cari S, PA-C  ondansetron  (ZOFRAN ) 4 MG tablet Take 4 mg by mouth as needed.    [provider]  pantoprazole  (PROTONIX ) 40 MG tablet Take 1 tablet (40 mg total) by mouth daily. 06/18/23   [place], Raymond J, MD  Potassium (POTASSIMIN PO) Take 1 tablet by mouth daily as needed (with lasix ).    [provider]  tiZANidine  (ZANAFLEX ) 4 MG tablet Take 4 mg by mouth at bedtime.    [provider]  Vitamin D , Ergocalciferol , (DRISDOL ) 1.25 MG (50000 UNIT) CAPS capsule Take 50,000 Units by mouth once a week. 10/26/23   [provider]  warfarin (COUMADIN ) 5 MG tablet Take 1/2 to 1 tablet daily or as directed by Coumadin  Clinic 01/30/24   Sheryle Donning, MD    Physical Exam    Vital Signs:  Mary Chambers does not have vital signs available for review today.  Given telephonic nature of communication, physical exam is limited. AAOx3. NAD. Normal affect.  Speech and respirations are unlabored.  Accessory Clinical Findings    None  Assessment & Plan    1.  Preoperative Cardiovascular Risk Assessment: According to the Revised Cardiac Risk Index (RCRI), her Perioperative Risk of Major Cardiac Event is (%): 0.4. Her Functional Capacity in METs is: 6.27 according to the Duke Activity Status Index (DASI). Therefore, based on ACC/AHA guidelines, patient would be at acceptable risk for the planned procedure without further cardiovascular testing.   The patient was advised that if she develops new symptoms prior to surgery to contact our office to arrange for a follow-up visit, and she verbalized understanding.  Per Pharm D, patient may hold Coumadin  for 5 days prior to procedure.  Patient will not need bridging with Lovenox  around procedure.    Ideally aspirin  should be continued without interruption, however if the bleeding risk is too great, aspirin  may be held for 5-7 days prior to surgery. Please resume aspirin  post operatively when it is felt to be safe from a bleeding standpoint.    A copy of this note will be routed to requesting surgeon.  Time:   Today, I have spent 5 minutes with the patient with telehealth technology discussing medical history, symptoms, and management plan.     Ava Boatman, NP  02/15/2024, 9:53 AM

## 2024-02-18 ENCOUNTER — Telehealth: Payer: Self-pay | Admitting: Cardiology

## 2024-02-18 ENCOUNTER — Ambulatory Visit: Payer: Self-pay | Admitting: Surgery

## 2024-02-18 NOTE — Telephone Encounter (Signed)
**Note De-identified  [place] Obfuscation** Please advise 

## 2024-02-18 NOTE — Telephone Encounter (Signed)
 Pt c/o medication issue:  1. Name of Medication:  warfarin (COUMADIN ) 5 MG tablet  2. How are you currently taking this medication (dosage and times per day)?   3. Are you having a reaction (difficulty breathing--STAT)?   4. What is your medication issue?  Patient has been off Coumadin  since 5/13 because she had an injection. She goes back on it tonight, but has another procedure scheduled for 5/30. She is requesting further advisement on Coumadin  therapy. She says she hasn't been given instructions by referring provider, but assumes it will need to be held again.

## 2024-02-18 NOTE — Telephone Encounter (Signed)
 Requesting office sent duplicate request. Pt has been cleared as of 02/15/24. I will refax clearance notes that reflect the pt has been cleared as well as warfarin recommendations.

## 2024-02-18 NOTE — Telephone Encounter (Addendum)
 Returned pt call.  She had epidural injections on     and was off warfarin 5 days before procedure.  She is to restart warfarin tonight.  She is now scheduled for hernia surgery on 5/30.  She will need to hold warfarin 5 days before hernia surgery.  Told pt to restart warfarin tonight as instructed.  Take last dose of warfarin 5/24 for hernia surgery.  She has INR appt already scheduled for 5/28.  Told pt to keep that appt to get instructions on resuming warfarin after hernia surgery.  She verbalized understanding and appreciated call back.

## 2024-02-19 ENCOUNTER — Encounter (HOSPITAL_BASED_OUTPATIENT_CLINIC_OR_DEPARTMENT_OTHER): Payer: Self-pay

## 2024-02-20 ENCOUNTER — Ambulatory Visit (HOSPITAL_BASED_OUTPATIENT_CLINIC_OR_DEPARTMENT_OTHER): Admitting: Family Medicine

## 2024-02-26 NOTE — Patient Instructions (Signed)
 SURGICAL WAITING ROOM VISITATION  Patients having surgery or a procedure may have no more than 2 support people in the waiting area - these visitors may rotate.    Children under the age of 51 must have an adult with them who is not the patient.  Due to an increase in RSV and influenza rates and associated hospitalizations, children ages 37 and under may not visit patients in [facility] hospitals.  Visitors with respiratory illnesses are discouraged from visiting and should remain at home.  If the patient needs to stay at the hospital during part of their recovery, the visitor guidelines for inpatient rooms apply. Pre-op nurse will coordinate an appropriate time for 1 support person to accompany patient in pre-op.  This support person may not rotate.    Please refer to the [facility] website for the visitor guidelines for Inpatients (after your surgery is over and you are in a regular room).       Your procedure is scheduled on: 02/29/24   Report to [facility] Main Entrance    Report to admitting at : 1:00 PM   Call this number if you have problems the morning of surgery [phone]   Do not eat food :After Midnight.   After Midnight you may have the following liquids until : 12:00 PM DAY OF SURGERY  Water  Non-Citrus Juices (without pulp, NO RED-Apple, White grape, White cranberry) Black Coffee (NO MILK/CREAM OR CREAMERS, sugar ok)  Clear Tea (NO MILK/CREAM OR CREAMERS, sugar ok) regular and decaf                             Plain Jell-O (NO RED)                                           Fruit ices (not with fruit pulp, NO RED)                                     Popsicles (NO RED)                                                               Sports drinks like Gatorade (NO RED)              FOLLOW ANY ADDITIONAL PRE OP INSTRUCTIONS YOU RECEIVED FROM YOUR SURGEON'S OFFICE!!!   Oral Hygiene is also important to reduce your risk of infection.                                     Remember - BRUSH YOUR TEETH THE MORNING OF SURGERY WITH YOUR REGULAR TOOTHPASTE  DENTURES WILL BE REMOVED PRIOR TO SURGERY PLEASE DO NOT APPLY "Poly grip" OR ADHESIVES!!!   Do NOT smoke after Midnight   Stop all vitamins and herbal supplements 7 days before surgery.   Take these medicines the morning of surgery with A SIP OF WATER : gabapentin,cetirizine ,metoprolol ,pantoprazole .Tylenol  as needed.  You may not have any metal on your body including hair pins, jewelry, and body piercing             Do not wear make-up, lotions, powders, perfumes/cologne, or deodorant  Do not wear nail polish including gel and S&S, artificial/acrylic nails, or any other type of covering on natural nails including finger and toenails. If you have artificial nails, gel coating, etc. that needs to be removed by a nail salon please have this removed prior to surgery or surgery may need to be canceled/ delayed if the surgeon/ anesthesia feels like they are unable to be safely monitored.   Do not shave  48 hours prior to surgery.    Do not bring valuables to the hospital. [facility] IS NOT             RESPONSIBLE   FOR VALUABLES.   Contacts, glasses, dentures or bridgework may not be worn into surgery.   Bring small overnight bag day of surgery.   DO NOT BRING YOUR HOME MEDICATIONS TO THE HOSPITAL. PHARMACY WILL DISPENSE MEDICATIONS LISTED ON YOUR MEDICATION LIST TO YOU DURING YOUR ADMISSION IN THE HOSPITAL!    Patients discharged on the day of surgery will not be allowed to drive home.  Someone NEEDS to stay with you for the first 24 hours after anesthesia.   Special Instructions: Bring a copy of your healthcare power of attorney and living will documents the day of surgery if you haven't scanned them before.              Please read over the following fact sheets you were given: IF YOU HAVE QUESTIONS ABOUT YOUR PRE-OP INSTRUCTIONS PLEASE CALL [phone]   If you  received a COVID test during your pre-op visit  it is requested that you wear a mask when out in public, stay away from anyone that may not be feeling well and notify your surgeon if you develop symptoms. If you test positive for Covid or have been in contact with anyone that has tested positive in the last 10 days please notify you surgeon.    These are anesthesia recommendations for holding your anticoagulants.  Please contact your prescribing physician to confirm IF it is safe to hold your anticoagulants for this length of time.   Eliquis Apixaban   72 hours   Xarelto Rivaroxaban   72 hours  Plavix Clopidogrel   120 hours  Pletal Cilostazol   120 hours  HOLD Coumadin  after: 02/23/24  [facility] - Preparing for Surgery Before surgery, you can play an important role.  Because skin is not sterile, your skin needs to be as free of germs as possible.  You can reduce the number of germs on your skin by washing with CHG (chlorahexidine gluconate) soap before surgery.  CHG is an antiseptic cleaner which kills germs and bonds with the skin to continue killing germs even after washing. Please DO NOT use if you have an allergy to CHG or antibacterial soaps.  If your skin becomes reddened/irritated stop using the CHG and inform your nurse when you arrive at Short Stay. Do not shave (including legs and underarms) for at least 48 hours prior to the first CHG shower.  You may shave your face/neck. Please follow these instructions carefully:  1.  Shower with CHG Soap the night before surgery and the  morning of Surgery.  2.  If you choose to wash your hair, wash your hair first as usual with your  normal  shampoo.  3.  After you shampoo, rinse your hair and body thoroughly to remove the  shampoo.                           4.  Use CHG as you would any other liquid soap.  You can apply chg directly  to the skin and wash                       Gently with a scrungie or clean washcloth.  5.  Apply the CHG Soap to  your body ONLY FROM THE NECK DOWN.   Do not use on face/ open                           Wound or open sores. Avoid contact with eyes, ears mouth and genitals (private parts).                       Wash face,  Genitals (private parts) with your normal soap.             6.  Wash thoroughly, paying special attention to the area where your surgery  will be performed.  7.  Thoroughly rinse your body with warm water  from the neck down.  8.  DO NOT shower/wash with your normal soap after using and rinsing off  the CHG Soap.                9.  Pat yourself dry with a clean towel.            10.  Wear clean pajamas.            11.  Place clean sheets on your bed the night of your first shower and do not  sleep with pets. Day of Surgery : Do not apply any lotions/deodorants the morning of surgery.  Please wear clean clothes to the hospital/surgery center.  FAILURE TO FOLLOW THESE INSTRUCTIONS MAY RESULT IN THE CANCELLATION OF YOUR SURGERY PATIENT SIGNATURE_________________________________  NURSE SIGNATURE__________________________________  ________________________________________________________________________

## 2024-02-27 ENCOUNTER — Encounter (HOSPITAL_COMMUNITY): Payer: Self-pay

## 2024-02-27 ENCOUNTER — Ambulatory Visit

## 2024-02-27 ENCOUNTER — Other Ambulatory Visit: Payer: Self-pay

## 2024-02-27 ENCOUNTER — Encounter (HOSPITAL_COMMUNITY)
Admission: RE | Admit: 2024-02-27 | Discharge: 2024-02-27 | Disposition: A | Discharge status: Home / Self Care | Source: Ambulatory Visit | Attending: Surgery | Admitting: Surgery

## 2024-02-27 VITALS — BP 176/79 | HR 74 | Temp 98.4°F | Ht 67.0 in | Wt 272.0 lb

## 2024-02-27 DIAGNOSIS — K4090 Unilateral inguinal hernia, without obstruction or gangrene, not specified as recurrent: Secondary | ICD-10-CM | POA: Insufficient documentation

## 2024-02-27 DIAGNOSIS — Z7901 Long term (current) use of anticoagulants: Secondary | ICD-10-CM

## 2024-02-27 DIAGNOSIS — Z952 Presence of prosthetic heart valve: Secondary | ICD-10-CM | POA: Insufficient documentation

## 2024-02-27 DIAGNOSIS — K429 Umbilical hernia without obstruction or gangrene: Secondary | ICD-10-CM | POA: Insufficient documentation

## 2024-02-27 DIAGNOSIS — I351 Nonrheumatic aortic (valve) insufficiency: Secondary | ICD-10-CM | POA: Insufficient documentation

## 2024-02-27 DIAGNOSIS — Z01818 Encounter for other preprocedural examination: Secondary | ICD-10-CM | POA: Diagnosis present

## 2024-02-27 DIAGNOSIS — I10 Essential (primary) hypertension: Secondary | ICD-10-CM

## 2024-02-27 HISTORY — DX: Anemia, unspecified: D64.9

## 2024-02-27 HISTORY — DX: Personal history of urinary calculi: Z87.442

## 2024-02-27 HISTORY — DX: Pneumonia, unspecified organism: J18.9

## 2024-02-27 LAB — BASIC METABOLIC PANEL WITH GFR
Anion gap: 9 (ref 5–15)
BUN: 18 mg/dL (ref 6–20)
CO2: 25 mmol/L (ref 22–32)
Calcium: 9 mg/dL (ref 8.9–10.3)
Chloride: 105 mmol/L (ref 98–111)
Creatinine, Ser: 0.86 mg/dL (ref 0.44–1.00)
GFR, Estimated: >60 mL/min (ref >60)
Glucose, Bld: 82 mg/dL (ref 70–99)
Potassium: 4.3 mmol/L (ref 3.5–5.1)
Sodium: 139 mmol/L (ref 135–145)

## 2024-02-27 LAB — CBC
HCT: 40.7 % (ref 36.0–46.0)
Hemoglobin: 12.5 g/dL (ref 12.0–15.0)
MCH: 26.2 pg (ref 26.0–34.0)
MCHC: 30.7 g/dL (ref 30.0–36.0)
MCV: 85.1 fL (ref 80.0–100.0)
Platelets: 301 10*3/uL (ref 150–400)
RBC: 4.78 MIL/uL (ref 3.87–5.11)
RDW: 15 % (ref 11.5–15.5)
WBC: 10.8 10*3/uL — ABNORMAL HIGH (ref 4.0–10.5)
nRBC: 0 % (ref 0.0–0.2)

## 2024-02-27 LAB — POCT INR: INR: 1.1 — AB (ref 2.0–3.0)

## 2024-02-27 NOTE — Progress Notes (Signed)
   02/27/24 1032  OBSTRUCTIVE SLEEP APNEA  Have you ever been diagnosed with sleep apnea through a sleep study? No  Do you snore loudly (loud enough to be heard through closed doors)?  1  Do you often feel tired, fatigued, or sleepy during the daytime (such as falling asleep during driving or talking to someone)? 0  Has anyone observed you stop breathing during your sleep? 1  Do you have, or are you being treated for high blood pressure? 1  BMI more than 35 kg/m2? 1  Age > 50 (1-yes) 1  Neck circumference greater than:Female 16 inches or larger, Female 17inches or larger? 1  Female Gender (Yes=1) 0  Obstructive Sleep Apnea Score 6  Score 5 or greater  Results sent to PCP

## 2024-02-27 NOTE — Patient Instructions (Signed)
 Description   Continue to HOLD Warfarin pre-procedure. Post procedure resume Warfarin (as instructed by surgeon). Take 1.5 tablets on 5/30 and 5/31 and then continue taking Warfarin 1 tablet (5mg ) daily except 1/2 tablet (2.5mg ) on Mondays.  Keep the number of servings of green leafy vegetables consistent in your diet.  Recheck in 1 week after procedure. Coumadin  Clinic [phone] call with medication changes and or bleeding.

## 2024-02-27 NOTE — Progress Notes (Signed)
 For Anesthesia: PCP - [place], Alonza Jansky, MD  Cardiologist - Sheryle Donning, MD  Clearance: Ava Boatman, NP : 02/15/24 Bowel Prep reminder:  Chest x-ray - 09/08/23 EKG - 09/10/23 Stress Test -  ECHO - 03/23/23 Cardiac Cath -  Pacemaker/ICD device last checked: Pacemaker orders received: Device Rep notified:  Spinal Cord Stimulator:N/A  Sleep Study - Yes CPAP - NO  Fasting Blood Sugar - N/A Checks Blood Sugar _____ times a day Date and result of last Hgb A1c-  Last dose of GLP1 agonist- N/A GLP1 instructions:   Last dose of SGLT-2 inhibitors- N/A SGLT-2 instructions:   Blood Thinner Instructions: Coumadin : on hold since: 02/23/24 Aspirin  Instructions: Last Dose:  Activity level: Can go up a flight of stairs and activities of daily living without stopping and without chest pain and/or shortness of breath      Unable to go up a flight of stairs without shortness of breath    Anesthesia review: Hx: aortic valve endocarditis, severe aortic regurgitation, bicuspid aortic valve s/p AVR on 11/2021, hypertension, morbid obesity, obstructive sleep apnea (No CPAP).  Patient denies shortness of breath, fever, cough and chest pain at PAT appointment   Patient verbalized understanding of instructions that were given to them at the PAT appointment. Patient was also instructed that they will need to review over the PAT instructions again at home before surgery.

## 2024-02-28 ENCOUNTER — Encounter (HOSPITAL_COMMUNITY): Payer: Self-pay

## 2024-02-28 NOTE — Progress Notes (Signed)
 Case: [id number] Date/Time: 02/29/24 1515   Procedures:      REPAIR, HERNIA, INGUINAL, LAPAROSCOPIC (Left) - LATERALITY: LEFT     REPAIR, HERNIA, UMBILICAL, ADULT   Anesthesia type: General   Diagnosis:      Non-recurrent unilateral inguinal hernia without obstruction or gangrene [K40.90]     Umbilical hernia without obstruction or gangrene [K42.9]   Pre-op diagnosis: UMBILICAL HERNIA AND LEFT INGUINAL HERNIA   Location: [facility] ROOM 03 / [facility]   Surgeons: Stechschulte, Avon Boers, MD       DISCUSSION: Mary Chambers is a 53 yo female who presents to PAT prior to surgery above. PMH of HTN, bicuspid aortic valve with severe AI, s/p AVR (2023), severe OSA (Pt is being set up with CPAP), GERD, arthritis, depression, obesity (BMI 42), s/p lumbar fusion (L1-2)  Pt follows with Cardiology for hx of aortic valve endocarditis, severe AI, and bicuspid aortic valve s/p AVR in 2023. Last seen in clinic by Dr. Veryl Gottron on 10/17/23. She is on Coumadin . Had tele visit for clearance on 5/16 and was noted to be doing well from cardiac standpoint.:  "Preoperative Cardiovascular Risk Assessment: According to the Revised Cardiac Risk Index (RCRI), her Perioperative Risk of Major Cardiac Event is (%): 0.4. Her Functional Capacity in METs is: 6.27 according to the Duke Activity Status Index (DASI). Therefore, based on ACC/AHA guidelines, patient would be at acceptable risk for the planned procedure without further cardiovascular testing.  The patient was advised that if she develops new symptoms prior to surgery to contact our office to arrange for a follow-up visit, and she verbalized understanding. Per Pharm D, patient may hold Coumadin  for 5 days prior to procedure.  Patient will not need bridging with Lovenox  around procedure.  Ideally aspirin  should be continued without interruption, however if the bleeding risk is too great, aspirin  may be held for 5-7 days prior to surgery. Please resume aspirin  post  operatively when it is felt to be safe from a bleeding standpoint. "  LD Warfarin: 5/24  VS: BP (!) 176/79   Pulse 74   Temp 36.9 C (Oral)   Ht 5\' 7"  (1.702 m)   Wt 123.4 kg   SpO2 90%   BMI 42.60 kg/m   PROVIDERS: de [place], Alonza Jansky, MD   LABS: Labs reviewed: Acceptable for surgery. (all labs ordered are listed, but only abnormal results are displayed)  Labs Reviewed  CBC - Abnormal; Notable for the following components:      Result Value   WBC 10.8 (*)    All other components within normal limits  BASIC METABOLIC PANEL WITH GFR      CV: Echo 03/23/23:  IMPRESSIONS    1. Left ventricular ejection fraction, by estimation, is 50 to 55%. The left ventricle has low normal function. The left ventricle has no regional wall motion abnormalities. Left ventricular diastolic parameters are consistent with Grade I diastolic dysfunction (impaired relaxation).  2. Right ventricular systolic function is normal. The right ventricular size is normal. There is normal pulmonary artery systolic pressure. The estimated right ventricular systolic pressure is 25.5 mmHg.  3. The mitral valve is normal in structure. No evidence of mitral valve regurgitation. No evidence of mitral stenosis.  4. The aortic valve has been repaired/replaced. 23 mm On-X valve present. Aortic valve regurgitation is not visualized. Aortic valve sclerosis/calcification is present, without any evidence of aortic stenosis. Aortic valve mean gradient measures 12.0 mmHg.  5. The inferior vena cava is normal in size  with greater than 50% respiratory variability, suggesting right atrial pressure of 3 mmHg.  Cardiac monitor 10/11/22:   Patient had a min HR of 52 bpm, max HR of 164 bpm, and avg HR of 76 bpm. Predominant underlying rhythm was Sinus Rhythm. 1 run of Ventricular Tachycardia occurred lasting 5 beats with a max rate of 148 bpm (avg 135 bpm). 2 Supraventricular Tachycardia runs occurred, the run with the  fastest interval lasting 5 beats with a max rate of 164 bpm (avg 145 bpm); the run with the fastest interval was also the longest. No atrial fibrillation, high degree block, or pauses noted. Isolated atrial and ventricular ectopy was rare (<1%). There were 26 triggered events, which were predominantly NSR.   R/LHC 10/07/2021:    The left ventricular systolic function is normal.   LV end diastolic pressure is mildly elevated.   There is mild aortic valve stenosis.  Mild aortic valve gradient noted on pullback   No angiographically apparent coronary artery disease.   Ao saturation 96%, PA saturation 71%, PA pressure 35/17, mean PA pressure 25 mmHg; mean pulmonary capillary wedge pressure 17 mmHg; cardiac output 7.02 L/min, cardiac index 3.1.   Wide pulse pressure noted during the case likely related to her aortic insufficiency.  No coronary artery disease.  Continue with plans for management of aortic regurgitation.  Past Medical History:  Diagnosis Date   Abscess 06/23/2021   Achilles tendonitis 01/16/2014   Overview:   Followed by [employer].  Treated with brace and diclofenac .   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  Follow-up with Dr. Marne Sings.  Watch for opportunities to increase physical activity within neuromuscular capabilities.   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   Transaminitis 06/23/2021   Urinary incontinence 06/03/2010   Vertebral osteomyelitis (HCC)     Past Surgical History:  Procedure Laterality Date   ABDOMINAL HYSTERECTOMY     AORTIC VALVE REPLACEMENT N/A 11/07/2021   Procedure: AORTIC VALVE REPLACEMENT USING A On-X AORTIC VALVE.;  Surgeon: Hilarie Lovely, MD;  Location: [facility];  Service: Open Heart Surgery;  Laterality: N/A;   BACK SURGERY     BUBBLE STUDY  08/31/2021   Procedure: BUBBLE STUDY;  Surgeon: Sonny Dust, MD;  Location: [facility] ENDOSCOPY;  Service: Cardiovascular;;   CARPAL TUNNEL RELEASE Bilateral    CHOLECYSTECTOMY     IR FLUORO GUIDED NEEDLE PLC ASPIRATION/INJECTION LOC  06/23/2021   LITHOTRIPSY     neck fusion     RIGHT/LEFT HEART CATH AND CORONARY ANGIOGRAPHY N/A 10/07/2021   Procedure: RIGHT/LEFT HEART CATH AND CORONARY ANGIOGRAPHY;  Surgeon: Lucendia Rusk, MD;  Location: [facility] INVASIVE CV LAB;  Service: Cardiovascular;  Laterality: N/A;   TEE WITHOUT  CARDIOVERSION N/A 06/28/2021   Procedure: TRANSESOPHAGEAL ECHOCARDIOGRAM (TEE);  Surgeon: Lenise Quince, MD;  Location: [facility] ENDOSCOPY;  Service: Cardiovascular;  Laterality: N/A;   TEE WITHOUT CARDIOVERSION N/A 08/31/2021   Procedure: TRANSESOPHAGEAL ECHOCARDIOGRAM (TEE);  Surgeon: Sonny Dust, MD;  Location: [facility] ENDOSCOPY;  Service: Cardiovascular;  Laterality: N/A;   TEE WITHOUT CARDIOVERSION N/A 11/07/2021   Procedure: TRANSESOPHAGEAL ECHOCARDIOGRAM (TEE);  Surgeon: Hilarie Lovely, MD;  Location: [facility] OR;  Service: Open Heart Surgery;  Laterality: N/A;   TUBAL LIGATION      MEDICATIONS:  acetaminophen  (TYLENOL ) 500 MG tablet   amLODipine  (NORVASC ) 2.5 MG tablet   aspirin  EC 81 MG tablet   atorvastatin  (LIPITOR) 10 MG tablet   cetirizine  (ZYRTEC ) 10 MG tablet   ELDERBERRY PO   escitalopram  (LEXAPRO ) 20 MG tablet   fluticasone  (FLONASE ) 50 MCG/ACT  nasal spray   furosemide  (LASIX ) 20 MG tablet   gabapentin (NEURONTIN) 100 MG capsule   iron  polysaccharides (NIFEREX) 150 MG capsule   loperamide  (IMODIUM ) 2 MG capsule   Magnesium  250 MG CAPS   metoprolol  tartrate (LOPRESSOR ) 25 MG tablet   Multiple Vitamin (MULTIVITAMIN WITH MINERALS) TABS tablet   naproxen  (EC NAPROSYN ) 500 MG EC tablet   ondansetron  (ZOFRAN ) 4 MG tablet   pantoprazole  (PROTONIX ) 40 MG tablet   Potassium 99 MG TABS   tiZANidine  (ZANAFLEX ) 4 MG tablet   vitamin C  (ASCORBIC ACID ) 250 MG tablet   warfarin (COUMADIN ) 5 MG tablet   No current facility-administered medications for this encounter.   Antoinette Kirschner [facility] Surgical Short Stay/Anesthesiology [facility] Phone [phone] 02/28/2024 8:42 AM

## 2024-02-28 NOTE — Anesthesia Preprocedure Evaluation (Signed)
 Anesthesia Evaluation  Patient identified by MRN, date of birth, ID band Patient awake    Reviewed: Allergy & Precautions, NPO status , Patient's Chart, lab work & pertinent test results  Airway Mallampati: II  TM Distance: >3 FB Neck ROM: Full    Dental  (+) Dental Advisory Given, Teeth Intact   Pulmonary sleep apnea , neg recent URI   Pulmonary exam normal breath sounds clear to auscultation       Cardiovascular hypertension, Pt. on medications + Valvular Problems/Murmurs AS and AI  Rhythm:Regular Rate:Normal  Echo 03/23/23:  1. Left ventricular ejection fraction, by estimation, is 50 to 55%. The left ventricle has low normal function. The left ventricle has no regional wall motion abnormalities. Left ventricular diastolic parameters are consistent with Grade I diastolic dysfunction (impaired relaxation).  2. Right ventricular systolic function is normal. The right ventricular size is normal. There is normal pulmonary artery systolic pressure. The estimated right ventricular systolic pressure is 25.5 mmHg.  3. The mitral valve is normal in structure. No evidence of mitral valve regurgitation. No evidence of mitral stenosis.  4. The aortic valve has been repaired/replaced. 23 mm On-X valve present. Aortic valve regurgitation is not visualized. Aortic valve sclerosis/calcification is present, without any evidence of aortic stenosis. Aortic valve mean gradient measures 12.0 mmHg.  5. The inferior vena cava is normal in size with greater than 50% respiratory variability, suggesting right atrial pressure of 3 mmHg.    Echo 11/04/2021 1. Left ventricular ejection fraction, by estimation, is 55 to 60%. The left ventricle has normal function. The left ventricle has no regional wall motion abnormalities. The left ventricular internal cavity size was mildly dilated. There is mild concentric left ventricular hypertrophy. Left ventricular diastolic  parameters were normal.  2. Right ventricular systolic function is normal. The right ventricular size is normal.  3. Left atrial size was mild to moderately dilated.  4. The mitral valve is grossly normal. Trivial mitral valve  regurgitation. No evidence of mitral stenosis.  5. Aortic valve not well visualized on current images but known to be bicuspid from prior study. Severe eccentric AR, see image 41, jet fills LVOT. The aortic valve was not well visualized. There is moderate calcification of the aortic valve. Aortic valve regurgitation is severe.  6. The inferior vena cava is normal in size with <50% respiratory variability, suggesting right atrial pressure of 8 mmHg.    Cath 10/2021   The left ventricular systolic function is normal.   LV end diastolic pressure is mildly elevated.   There is mild aortic valve stenosis.  Mild aortic valve gradient noted on pullback   No angiographically apparent coronary artery disease.   Ao saturation 96%, PA saturation 71%, PA pressure 35/17, mean PA pressure 25 mmHg; mean pulmonary capillary wedge pressure 17 mmHg; cardiac output 7.02 L/min, cardiac index 3.1.  Wide pulse pressure noted during the case likely related to her aortic insufficiency.  No coronary artery disease.  Continue with plans for management of aortic regurgitation.   Echo 08/2021 1. The aortic valve is bicuspid with fusion of the RCC and LCC. The leaflets are asymmetrically thickened most notably at the [facility] leaflet tip with concern for vegetation/infective endocarditis. There is flail of one of the leaflets (suspect NCC) with lack of central coaptation and resultant severe, highly eccentric aortic regurgitation.  2. Left ventricular ejection fraction, by estimation, is 65 to 70%. The left ventricle has normal function.  3. Right ventricular systolic function is normal. The  right ventricular size is normal.  4. No left atrial/left atrial appendage thrombus was detected.   5. The mitral valve is normal in structure. Trivial mitral valve regurgitation.  6. Aortic dilatation noted. There is borderline dilatation of the aortic root, measuring 36 mm. There is mild-to-moderate dilatation of the ascending aorta, measuring 43 mm.  7. The interatrial septum is aneurysmal. There is a PFO demonstrated by color doppler. Weakly positive agitated saline bubble study.     Neuro/Psych  Headaches PSYCHIATRIC DISORDERS Anxiety Depression     Neuromuscular disease    GI/Hepatic Neg liver ROS,GERD  ,,  Endo/Other    Class 3 obesity  Renal/GU Renal disease     Musculoskeletal  (+) Arthritis ,    Abdominal  (+) + obese  Peds  Hematology  (+) Blood dyscrasia, anemia , DOES NOT REFUSE BLOOD PRODUCTS  Anesthesia Other Findings   Reproductive/Obstetrics                             Anesthesia Physical Anesthesia Plan  ASA: 3  Anesthesia Plan: General   Post-op Pain Management: Tylenol  PO (pre-op)* and Gabapentin PO (pre-op)*   Induction: Intravenous  PONV Risk Score and Plan: 4 or greater and Treatment may vary due to age or medical condition, Midazolam , Ondansetron  and Dexamethasone   Airway Management Planned: Oral ETT  Additional Equipment:   Intra-op Plan:   Post-operative Plan: Extubation in OR  Informed Consent: I have reviewed the patients History and Physical, chart, labs and discussed the procedure including the risks, benefits and alternatives for the proposed anesthesia with the patient or authorized representative who has indicated his/her understanding and acceptance.     Dental advisory given  Plan Discussed with: CRNA  Anesthesia Plan Comments: (See PAT note from 5/28)        Anesthesia Quick Evaluation

## 2024-02-29 ENCOUNTER — Other Ambulatory Visit: Payer: Self-pay

## 2024-02-29 ENCOUNTER — Ambulatory Visit (HOSPITAL_COMMUNITY): Payer: Self-pay | Admitting: Medical

## 2024-02-29 ENCOUNTER — Ambulatory Visit (HOSPITAL_COMMUNITY)
Admission: RE | Admit: 2024-02-29 | Discharge: 2024-02-29 | Disposition: A | Discharge status: Home / Self Care | Source: Ambulatory Visit | Attending: Surgery | Admitting: Surgery

## 2024-02-29 ENCOUNTER — Emergency Department (HOSPITAL_COMMUNITY)
Admission: EM | Admit: 2024-02-29 | Discharge: 2024-02-29 | Disposition: A | Discharge status: Home / Self Care | Attending: Emergency Medicine | Admitting: Emergency Medicine

## 2024-02-29 ENCOUNTER — Emergency Department (HOSPITAL_COMMUNITY)

## 2024-02-29 ENCOUNTER — Encounter (HOSPITAL_COMMUNITY): Payer: Self-pay | Admitting: Surgery

## 2024-02-29 ENCOUNTER — Telehealth: Admitting: Physician Assistant

## 2024-02-29 ENCOUNTER — Encounter (HOSPITAL_COMMUNITY)
Admission: RE | Disposition: A | Discharge status: Home / Self Care | Payer: Self-pay | Source: Ambulatory Visit | Attending: Surgery

## 2024-02-29 ENCOUNTER — Ambulatory Visit (HOSPITAL_COMMUNITY): Admitting: Anesthesiology

## 2024-02-29 DIAGNOSIS — K219 Gastro-esophageal reflux disease without esophagitis: Secondary | ICD-10-CM | POA: Diagnosis not present

## 2024-02-29 DIAGNOSIS — D649 Anemia, unspecified: Secondary | ICD-10-CM | POA: Insufficient documentation

## 2024-02-29 DIAGNOSIS — R1031 Right lower quadrant pain: Secondary | ICD-10-CM | POA: Diagnosis present

## 2024-02-29 DIAGNOSIS — B3731 Acute candidiasis of vulva and vagina: Secondary | ICD-10-CM

## 2024-02-29 DIAGNOSIS — Z7982 Long term (current) use of aspirin: Secondary | ICD-10-CM | POA: Diagnosis not present

## 2024-02-29 DIAGNOSIS — K4090 Unilateral inguinal hernia, without obstruction or gangrene, not specified as recurrent: Secondary | ICD-10-CM

## 2024-02-29 DIAGNOSIS — F419 Anxiety disorder, unspecified: Secondary | ICD-10-CM | POA: Insufficient documentation

## 2024-02-29 DIAGNOSIS — Z6841 Body Mass Index (BMI) 40.0 and over, adult: Secondary | ICD-10-CM | POA: Diagnosis not present

## 2024-02-29 DIAGNOSIS — Z7901 Long term (current) use of anticoagulants: Secondary | ICD-10-CM | POA: Diagnosis not present

## 2024-02-29 DIAGNOSIS — Y92410 Unspecified street and highway as the place of occurrence of the external cause: Secondary | ICD-10-CM | POA: Diagnosis not present

## 2024-02-29 DIAGNOSIS — Z79899 Other long term (current) drug therapy: Secondary | ICD-10-CM | POA: Insufficient documentation

## 2024-02-29 DIAGNOSIS — F32A Depression, unspecified: Secondary | ICD-10-CM | POA: Insufficient documentation

## 2024-02-29 DIAGNOSIS — I10 Essential (primary) hypertension: Secondary | ICD-10-CM | POA: Insufficient documentation

## 2024-02-29 DIAGNOSIS — G4730 Sleep apnea, unspecified: Secondary | ICD-10-CM | POA: Diagnosis not present

## 2024-02-29 DIAGNOSIS — G4733 Obstructive sleep apnea (adult) (pediatric): Secondary | ICD-10-CM

## 2024-02-29 DIAGNOSIS — Z91040 Latex allergy status: Secondary | ICD-10-CM | POA: Diagnosis not present

## 2024-02-29 DIAGNOSIS — K429 Umbilical hernia without obstruction or gangrene: Secondary | ICD-10-CM

## 2024-02-29 DIAGNOSIS — M1990 Unspecified osteoarthritis, unspecified site: Secondary | ICD-10-CM | POA: Diagnosis not present

## 2024-02-29 DIAGNOSIS — E66813 Obesity, class 3: Secondary | ICD-10-CM | POA: Diagnosis not present

## 2024-02-29 LAB — CBC WITH DIFFERENTIAL/PLATELET
Abs Immature Granulocytes: 0.07 10*3/uL (ref 0.00–0.07)
Basophils Absolute: 0 10*3/uL (ref 0.0–0.1)
Basophils Relative: 0 %
Eosinophils Absolute: 0 10*3/uL (ref 0.0–0.5)
Eosinophils Relative: 0 %
HCT: 39.8 % (ref 36.0–46.0)
Hemoglobin: 11.9 g/dL — ABNORMAL LOW (ref 12.0–15.0)
Immature Granulocytes: 1 %
Lymphocytes Relative: 4 %
Lymphs Abs: 0.5 10*3/uL — ABNORMAL LOW (ref 0.7–4.0)
MCH: 25.6 pg — ABNORMAL LOW (ref 26.0–34.0)
MCHC: 29.9 g/dL — ABNORMAL LOW (ref 30.0–36.0)
MCV: 85.6 fL (ref 80.0–100.0)
Monocytes Absolute: 0.1 10*3/uL (ref 0.1–1.0)
Monocytes Relative: 1 %
Neutro Abs: 11.8 10*3/uL — ABNORMAL HIGH (ref 1.7–7.7)
Neutrophils Relative %: 94 %
Platelets: 279 10*3/uL (ref 150–400)
RBC: 4.65 MIL/uL (ref 3.87–5.11)
RDW: 15.4 % (ref 11.5–15.5)
WBC: 12.4 10*3/uL — ABNORMAL HIGH (ref 4.0–10.5)
nRBC: 0 % (ref 0.0–0.2)

## 2024-02-29 LAB — COMPREHENSIVE METABOLIC PANEL WITH GFR
ALT: 19 U/L (ref 0–44)
AST: 28 U/L (ref 15–41)
Albumin: 3.7 g/dL (ref 3.5–5.0)
Alkaline Phosphatase: 85 U/L (ref 38–126)
Anion gap: 11 (ref 5–15)
BUN: 14 mg/dL (ref 6–20)
CO2: 23 mmol/L (ref 22–32)
Calcium: 8.8 mg/dL — ABNORMAL LOW (ref 8.9–10.3)
Chloride: 106 mmol/L (ref 98–111)
Creatinine, Ser: 1.25 mg/dL — ABNORMAL HIGH (ref 0.44–1.00)
GFR, Estimated: 52 mL/min — ABNORMAL LOW (ref >60)
Glucose, Bld: 155 mg/dL — ABNORMAL HIGH (ref 70–99)
Potassium: 4.2 mmol/L (ref 3.5–5.1)
Sodium: 140 mmol/L (ref 135–145)
Total Bilirubin: 0.5 mg/dL (ref 0.0–1.2)
Total Protein: 7.4 g/dL (ref 6.5–8.1)

## 2024-02-29 LAB — PROTIME-INR
INR: 1 (ref 0.8–1.2)
Prothrombin Time: 13.3 s (ref 11.4–15.2)

## 2024-02-29 LAB — APTT: aPTT: 27 s (ref 24–36)

## 2024-02-29 SURGERY — REPAIR, HERNIA, INGUINAL, LAPAROSCOPIC
Anesthesia: General | Site: Inguinal

## 2024-02-29 MED ORDER — LIDOCAINE HCL (PF) 2 % IJ SOLN
INTRAMUSCULAR | Status: AC
Start: 2024-02-29 — End: ?
  Filled 2024-02-29: qty 5

## 2024-02-29 MED ORDER — ORAL CARE MOUTH RINSE: 15.0000 mL | Freq: Once | OROMUCOSAL | Status: AC

## 2024-02-29 MED ORDER — ROCURONIUM BROMIDE 10 MG/ML (PF) SYRINGE
PREFILLED_SYRINGE | INTRAVENOUS | Status: DC | PRN
  Administered 2024-02-29: 70 mg via INTRAVENOUS

## 2024-02-29 MED ORDER — CHLORHEXIDINE GLUCONATE 0.12 % MT SOLN
15.0000 mL | Freq: Once | OROMUCOSAL | Status: AC
  Administered 2024-02-29: 15 mL via OROMUCOSAL

## 2024-02-29 MED ORDER — MIDAZOLAM HCL 2 MG/2ML IJ SOLN
INTRAMUSCULAR | Status: AC
Start: 2024-02-29 — End: ?
  Filled 2024-02-29: qty 2

## 2024-02-29 MED ORDER — FENTANYL CITRATE (PF) 100 MCG/2ML IJ SOLN
INTRAMUSCULAR | Status: AC
  Filled 2024-02-29: qty 2

## 2024-02-29 MED ORDER — BUPIVACAINE-EPINEPHRINE (PF) 0.25% -1:200000 IJ SOLN
INTRAMUSCULAR | Status: DC | PRN
  Administered 2024-02-29: 50 mL

## 2024-02-29 MED ORDER — ONDANSETRON HCL 4 MG/2ML IJ SOLN
INTRAMUSCULAR | Status: DC | PRN
  Administered 2024-02-29: 4 mg via INTRAVENOUS

## 2024-02-29 MED ORDER — PROPOFOL 10 MG/ML IV BOLUS
INTRAVENOUS | Status: AC
  Filled 2024-02-29: qty 20

## 2024-02-29 MED ORDER — CEFAZOLIN SODIUM-DEXTROSE 3-4 GM/150ML-% IV SOLN
3.0000 g | INTRAVENOUS | Status: AC
  Administered 2024-02-29: 3 g via INTRAVENOUS
  Filled 2024-02-29: qty 150

## 2024-02-29 MED ORDER — TERCONAZOLE 0.4 % VA CREA
TOPICAL_CREAM | VAGINAL | 0 refills | Status: DC
Start: 2024-02-29 — End: 2024-04-09

## 2024-02-29 MED ORDER — LACTATED RINGERS IV SOLN
INTRAVENOUS | Status: DC | PRN
Start: 2024-02-29 — End: 2024-02-29

## 2024-02-29 MED ORDER — PROPOFOL 10 MG/ML IV BOLUS
INTRAVENOUS | Status: DC | PRN
Start: 2024-02-29 — End: 2024-02-29
  Administered 2024-02-29: 200 mg via INTRAVENOUS

## 2024-02-29 MED ORDER — ACETAMINOPHEN 500 MG PO TABS: 1000.0000 mg | ORAL_TABLET | Freq: Once | ORAL | Status: DC

## 2024-02-29 MED ORDER — IOHEXOL 300 MG/ML  SOLN
100.0000 mL | Freq: Once | INTRAMUSCULAR | Status: AC | PRN
Start: 2024-02-29 — End: 2024-02-29
  Administered 2024-02-29: 100 mL via INTRAVENOUS

## 2024-02-29 MED ORDER — LACTATED RINGERS IV SOLN: INTRAVENOUS | Status: DC

## 2024-02-29 MED ORDER — LIDOCAINE HCL (PF) 2 % IJ SOLN
INTRAMUSCULAR | Status: DC | PRN
  Administered 2024-02-29: 80 mg via INTRADERMAL

## 2024-02-29 MED ORDER — DEXAMETHASONE SODIUM PHOSPHATE 10 MG/ML IJ SOLN
INTRAMUSCULAR | Status: AC
Start: 2024-02-29 — End: ?
  Filled 2024-02-29: qty 1

## 2024-02-29 MED ORDER — BUPIVACAINE-EPINEPHRINE (PF) 0.25% -1:200000 IJ SOLN
INTRAMUSCULAR | Status: AC
  Filled 2024-02-29: qty 30

## 2024-02-29 MED ORDER — ONDANSETRON HCL 4 MG/2ML IJ SOLN
INTRAMUSCULAR | Status: AC
  Filled 2024-02-29: qty 2

## 2024-02-29 MED ORDER — BUPIVACAINE LIPOSOME 1.3 % IJ SUSP
INTRAMUSCULAR | Status: AC
Start: 2024-02-29 — End: ?
  Filled 2024-02-29: qty 20

## 2024-02-29 MED ORDER — EPHEDRINE SULFATE (PRESSORS) 50 MG/ML IJ SOLN
INTRAMUSCULAR | Status: DC | PRN
Start: 2024-02-29 — End: 2024-02-29
  Administered 2024-02-29: 10 mg via INTRAVENOUS

## 2024-02-29 MED ORDER — SUGAMMADEX SODIUM 200 MG/2ML IV SOLN
INTRAVENOUS | Status: DC | PRN
  Administered 2024-02-29: 300 mg via INTRAVENOUS

## 2024-02-29 MED ORDER — CHLORHEXIDINE GLUCONATE CLOTH 2 % EX PADS: 6.0000 | MEDICATED_PAD | Freq: Once | CUTANEOUS | Status: DC

## 2024-02-29 MED ORDER — SODIUM CHLORIDE 0.9 % IV BOLUS
1000.0000 mL | Freq: Once | INTRAVENOUS | Status: AC
  Administered 2024-02-29: 1000 mL via INTRAVENOUS

## 2024-02-29 MED ORDER — DEXMEDETOMIDINE HCL IN NACL 80 MCG/20ML IV SOLN
INTRAVENOUS | Status: DC | PRN
Start: 2024-02-29 — End: 2024-02-29
  Administered 2024-02-29: 12 ug via INTRAVENOUS

## 2024-02-29 MED ORDER — OXYCODONE-ACETAMINOPHEN 5-325 MG PO TABS: 1.0000 | ORAL_TABLET | ORAL | 0 refills | Status: DC | PRN

## 2024-02-29 MED ORDER — BUPIVACAINE LIPOSOME 1.3 % IJ SUSP: 20.0000 mL | Freq: Once | INTRAMUSCULAR | Status: DC

## 2024-02-29 MED ORDER — OXYCODONE HCL 5 MG PO TABS: 5.0000 mg | ORAL_TABLET | Freq: Once | ORAL | Status: DC | PRN

## 2024-02-29 MED ORDER — DEXAMETHASONE SODIUM PHOSPHATE 10 MG/ML IJ SOLN
INTRAMUSCULAR | Status: DC | PRN
  Administered 2024-02-29: 5 mg via INTRAVENOUS

## 2024-02-29 MED ORDER — ENOXAPARIN SODIUM 40 MG/0.4ML IJ SOSY
40.0000 mg | PREFILLED_SYRINGE | Freq: Once | INTRAMUSCULAR | Status: AC
  Administered 2024-02-29: 40 mg via SUBCUTANEOUS
  Filled 2024-02-29: qty 0.4

## 2024-02-29 MED ORDER — DROPERIDOL 2.5 MG/ML IJ SOLN: 0.6250 mg | Freq: Once | INTRAMUSCULAR | Status: DC | PRN

## 2024-02-29 MED ORDER — FENTANYL CITRATE PF 50 MCG/ML IJ SOSY
PREFILLED_SYRINGE | INTRAMUSCULAR | Status: AC
  Filled 2024-02-29: qty 2

## 2024-02-29 MED ORDER — FENTANYL CITRATE PF 50 MCG/ML IJ SOSY
25.0000 ug | PREFILLED_SYRINGE | INTRAMUSCULAR | Status: DC | PRN
  Administered 2024-02-29 (×2): 50 ug via INTRAVENOUS

## 2024-02-29 MED ORDER — OXYCODONE HCL 5 MG/5ML PO SOLN: 5.0000 mg | Freq: Once | ORAL | Status: DC | PRN

## 2024-02-29 MED ORDER — MIDAZOLAM HCL 2 MG/2ML IJ SOLN
INTRAMUSCULAR | Status: DC | PRN
Start: 2024-02-29 — End: 2024-02-29
  Administered 2024-02-29: 2 mg via INTRAVENOUS

## 2024-02-29 MED ORDER — FENTANYL CITRATE (PF) 100 MCG/2ML IJ SOLN
INTRAMUSCULAR | Status: DC | PRN
  Administered 2024-02-29: 100 ug via INTRAVENOUS
  Administered 2024-02-29 (×2): 50 ug via INTRAVENOUS

## 2024-02-29 MED ORDER — ACETAMINOPHEN 500 MG PO TABS
1000.0000 mg | ORAL_TABLET | ORAL | Status: AC
  Administered 2024-02-29: 500 mg via ORAL
  Filled 2024-02-29: qty 2

## 2024-02-29 MED ORDER — MEPERIDINE HCL 50 MG/ML IJ SOLN: 6.2500 mg | INTRAMUSCULAR | Status: DC | PRN

## 2024-02-29 MED ORDER — ROCURONIUM BROMIDE 10 MG/ML (PF) SYRINGE
PREFILLED_SYRINGE | INTRAVENOUS | Status: AC
  Filled 2024-02-29: qty 10

## 2024-02-29 MED ORDER — KETOROLAC TROMETHAMINE 15 MG/ML IJ SOLN: 15.0000 mg | INTRAMUSCULAR | Status: DC

## 2024-02-29 SURGICAL SUPPLY — 44 items
BAG COUNTER SPONGE SURGICOUNT (BAG) ×2 IMPLANT
CABLE HIGH FREQUENCY MONO STRZ (ELECTRODE) ×2 IMPLANT
CHLORAPREP W/TINT 26 (MISCELLANEOUS) ×2 IMPLANT
COTTONBALL LRG STERILE PKG (GAUZE/BANDAGES/DRESSINGS) ×2 IMPLANT
COVER SURGICAL LIGHT HANDLE (MISCELLANEOUS) ×2 IMPLANT
DERMABOND ADVANCED .7 DNX12 (GAUZE/BANDAGES/DRESSINGS) ×2 IMPLANT
DRAPE LAPAROTOMY T 98X78 PEDS (DRAPES) ×2 IMPLANT
DRSG TEGADERM 4X4.75 (GAUZE/BANDAGES/DRESSINGS) ×2 IMPLANT
ELECT REM PT RETURN 15FT ADLT (MISCELLANEOUS) ×2 IMPLANT
GLOVE BIO SURGEON STRL SZ7.5 (GLOVE) ×2 IMPLANT
GLOVE INDICATOR 8.0 STRL GRN (GLOVE) ×2 IMPLANT
GOWN STRL REUS W/ TWL XL LVL3 (GOWN DISPOSABLE) ×2 IMPLANT
GRASPER SUT TROCAR 14GX15 (MISCELLANEOUS) ×2 IMPLANT
IRRIGATION SUCT STRKRFLW 2 WTP (MISCELLANEOUS) IMPLANT
KIT BASIN OR (CUSTOM PROCEDURE TRAY) ×2 IMPLANT
KIT TURNOVER KIT A (KITS) IMPLANT
MARKER SKIN DUAL TIP RULER LAB (MISCELLANEOUS) ×2 IMPLANT
MESH 3DMAX 5X7 LT XLRG (Mesh General) IMPLANT
NDL HYPO 20X1 EYE RM 11 (NEEDLE) ×2 IMPLANT
NDL INSUFFLATION 14GA 120MM (NEEDLE) ×2 IMPLANT
NEEDLE HYPO 20X1 EYE RM 11 (NEEDLE) ×2 IMPLANT
NEEDLE INSUFFLATION 14GA 120MM (NEEDLE) ×2 IMPLANT
NS IRRIG 1000ML POUR BTL (IV SOLUTION) ×2 IMPLANT
PACK GENERAL/GYN (CUSTOM PROCEDURE TRAY) ×2 IMPLANT
RELOAD STAPLE 4.0 BLU F/HERNIA (INSTRUMENTS) ×2 IMPLANT
RELOAD STAPLE 4.8 BLK F/HERNIA (STAPLE) IMPLANT
RELOAD STAPLE HERNIA 4.0 BLUE (INSTRUMENTS) ×2 IMPLANT
RELOAD STAPLE HERNIA 4.8 BLK (STAPLE) ×2 IMPLANT
SCISSORS LAP 5X35 DISP (ENDOMECHANICALS) ×2 IMPLANT
SET TUBE SMOKE EVAC HIGH FLOW (TUBING) ×2 IMPLANT
SLEEVE Z-THREAD 5X100MM (TROCAR) ×2 IMPLANT
SPIKE FLUID TRANSFER (MISCELLANEOUS) ×2 IMPLANT
STAPLER HERNIA 12 8.5 360D (INSTRUMENTS) ×2 IMPLANT
SUT MNCRL AB 4-0 PS2 18 (SUTURE) ×2 IMPLANT
SUT NOVA NAB GS-21 0 18 T12 DT (SUTURE) IMPLANT
SUT PDS AB 2-0 CT2 27 (SUTURE) IMPLANT
SUT VIC AB 3-0 SH 8-18 (SUTURE) ×2 IMPLANT
SUT VICRYL 0 UR6 27IN ABS (SUTURE) IMPLANT
SYR CONTROL 10ML LL (SYRINGE) ×2 IMPLANT
TOWEL OR 17X26 10 PK STRL BLUE (TOWEL DISPOSABLE) ×2 IMPLANT
TRAY FOL W/BAG SLVR 16FR STRL (SET/KITS/TRAYS/PACK) IMPLANT
TRAY LAPAROSCOPIC (CUSTOM PROCEDURE TRAY) ×2 IMPLANT
TROCAR ADV FIXATION 12X100MM (TROCAR) ×2 IMPLANT
TROCAR Z-THREAD FIOS 5X100MM (TROCAR) ×2 IMPLANT

## 2024-02-29 NOTE — Discharge Instructions (Signed)

## 2024-02-29 NOTE — Transfer of Care (Signed)
 Immediate Anesthesia Transfer of Care Note  Patient: LUCIANA CAMMARATA  Procedure(s) Performed: REPAIR, HERNIA, INGUINAL, LAPAROSCOPIC (Left: Inguinal) REPAIR, HERNIA, UMBILICAL, ADULT (Abdomen)  Patient Location: PACU  Anesthesia Type:General  Level of Consciousness: awake and alert   Airway & Oxygen Therapy: Patient Spontanous Breathing and Patient connected to nasal cannula oxygen  Post-op Assessment: Report given to RN and Post -op Vital signs reviewed and stable  Post vital signs: Reviewed and stable  Last Vitals:  Vitals Value Taken Time  BP 113/64 02/29/24 1618  Temp 36.7 C 02/29/24 1618  Pulse 91 02/29/24 1621  Resp 12 02/29/24 1621  SpO2 85 % 02/29/24 1621  Vitals shown include unfiled device data.  Last Pain:  Vitals:   02/29/24 1347  TempSrc:   PainSc: 0-No pain      Patients Stated Pain Goal: 5 (02/29/24 1328)  Complications: No notable events documented.

## 2024-02-29 NOTE — Progress Notes (Signed)
 E-Visit for Vaginal Symptoms  We are sorry that you are not feeling well. Here is how we plan to help! Based on what you shared with me it looks like you: May have a yeast vaginosis  Vaginosis is an inflammation of the vagina that can result in discharge, itching and pain. The cause is usually a change in the normal balance of vaginal bacteria or an infection. Vaginosis can also result from reduced estrogen levels after menopause.  The most common causes of vaginosis are:   Bacterial vaginosis which results from an overgrowth of one on several organisms that are normally present in your vagina.   Yeast infections which are caused by a naturally occurring fungus called candida.   Vaginal atrophy (atrophic vaginosis) which results from the thinning of the vagina from reduced estrogen levels after menopause.   Trichomoniasis which is caused by a parasite and is commonly transmitted by sexual intercourse.  Factors that increase your risk of developing vaginosis include: Medications, such as antibiotics and steroids Uncontrolled diabetes Use of hygiene products such as bubble bath, vaginal spray or vaginal deodorant Douching Wearing damp or tight-fitting clothing Using an intrauterine device (IUD) for birth control Hormonal changes, such as those associated with pregnancy, birth control pills or menopause Sexual activity Having a sexually transmitted infection  Your treatment plan is a prescription topical cream called Terconazole Remove applicator tip and apply a small amount to the tip of a clean finger then apply to affected area daily.  Be sure to take all of the medication as directed. Stop taking any medication if you develop a rash, tongue swelling or shortness of breath. Mothers who are breast feeding should consider pumping and discarding their breast milk while on these antibiotics. However, there is no consensus that infant exposure at these doses would be harmful.  Remember that  medication creams can weaken latex condoms. Aaron Aas   HOME CARE:  Good hygiene may prevent some types of vaginosis from recurring and may relieve some symptoms:  Avoid baths, hot tubs and whirlpool spas. Rinse soap from your outer genital area after a shower, and dry the area well to prevent irritation. Don't use scented or harsh soaps, such as those with deodorant or antibacterial action. Avoid irritants. These include scented tampons and pads. Wipe from front to back after using the toilet. Doing so avoids spreading fecal bacteria to your vagina.  Other things that may help prevent vaginosis include:  Don't douche. Your vagina doesn't require cleansing other than normal bathing. Repetitive douching disrupts the normal organisms that reside in the vagina and can actually increase your risk of vaginal infection. Douching won't clear up a vaginal infection. Use a latex condom. Both female and female latex condoms may help you avoid infections spread by sexual contact. Wear cotton underwear. Also wear pantyhose with a cotton crotch. If you feel comfortable without it, skip wearing underwear to bed. Yeast thrives in [employer] Your symptoms should improve in the next day or two.  GET HELP RIGHT AWAY IF:  You have pain in your lower abdomen ( pelvic area or over your ovaries) You develop nausea or vomiting You develop a fever Your discharge changes or worsens You have persistent pain with intercourse You develop shortness of breath, a rapid pulse, or you faint.  These symptoms could be signs of problems or infections that need to be evaluated by a medical provider now.  MAKE SURE YOU   Understand these instructions. Will watch your condition. Will get help  right away if you are not doing well or get worse.  Thank you for choosing an e-visit.  Your e-visit answers were reviewed by a board certified advanced clinical practitioner to complete your personal care plan. Depending upon the  condition, your plan could have included both over the counter or prescription medications.  Please review your pharmacy choice. Make sure the pharmacy is open so you can pick up prescription now. If there is a problem, you may contact your provider through [employer] and have the prescription routed to another pharmacy.  Your safety is important to us . If you have drug allergies check your prescription carefully.   For the next 24 hours you can use MyChart to ask questions about today's visit, request a non-urgent call back, or ask for a work or school excuse. You will get an email in the next two days asking about your experience. I hope that your e-visit has been valuable and will speed your recovery.    I have spent 5 minutes in review of e-visit questionnaire, review and updating patient chart, medical decision making and response to patient.   Angelia Kelp, PA-C

## 2024-02-29 NOTE — Anesthesia Postprocedure Evaluation (Signed)
 Anesthesia Post Note  Patient: Mary Chambers  Procedure(s) Performed: REPAIR, HERNIA, INGUINAL, LAPAROSCOPIC (Left: Inguinal) REPAIR, HERNIA, UMBILICAL, ADULT (Abdomen)     Patient location during evaluation: PACU Anesthesia Type: General Level of consciousness: awake and alert, oriented and patient cooperative Pain management: pain level controlled Vital Signs Assessment: post-procedure vital signs reviewed and stable Respiratory status: spontaneous breathing, nonlabored ventilation and respiratory function stable Cardiovascular status: blood pressure returned to baseline and stable Postop Assessment: no apparent nausea or vomiting and adequate PO intake Anesthetic complications: no   No notable events documented.  Last Vitals:  Vitals:   02/29/24 1700 02/29/24 1703  BP: 115/76 123/81  Pulse: 71 75  Resp: 10 14  Temp: 36.4 C   SpO2: 94% 98%    Last Pain:  Vitals:   02/29/24 1703  TempSrc:   PainSc: 2     LLE Motor Response: Purposeful movement (02/29/24 1703)   RLE Motor Response: Purposeful movement (02/29/24 1703)        Donnell Gails. Rya Rausch

## 2024-02-29 NOTE — Anesthesia Procedure Notes (Signed)
 Procedure Name: Intubation Date/Time: 02/29/2024 3:32 PM  Performed by: Hunter Maha, CRNAPre-anesthesia Checklist: Patient identified, Emergency Drugs available, Suction available and Patient being monitored Patient Re-evaluated:Patient Re-evaluated prior to induction Oxygen Delivery Method: Circle system utilized Preoxygenation: Pre-oxygenation with 100% oxygen Induction Type: IV induction Ventilation: Mask ventilation without difficulty Laryngoscope Size: Mac and 4 Grade View: Grade I Tube type: Oral Tube size: 7.5 mm Number of attempts: 1 Airway Equipment and Method: Stylet and Oral airway Placement Confirmation: ETT inserted through vocal cords under direct vision, positive ETCO2 and breath sounds checked- equal and bilateral Secured at: 23 cm Tube secured with: Tape Dental Injury: Teeth and Oropharynx as per pre-operative assessment

## 2024-02-29 NOTE — Progress Notes (Signed)
  Because of recurrent urinary symptoms and being scheduled for an umbilical hernia repair today, I feel your condition warrants further evaluation and I recommend that you be seen in a face-to-face visit.   NOTE: There will be NO CHARGE for this E-Visit   If you are having a true medical emergency, please call 911.     For an urgent face to face visit, [place] has multiple urgent care centers for your convenience.  Click the link below for the full list of locations and hours, walk-in wait times, appointment scheduling options and driving directions:  Urgent Care - [place], [place], [place], [place], [place], [place]  [place]     Your MyChart E-visit questionnaire answers were reviewed by a board certified advanced clinical practitioner to complete your personal care plan based on your specific symptoms.    Thank you for using e-Visits.   I have spent 5 minutes in review of e-visit questionnaire, review and updating patient chart, medical decision making and response to patient.   Angelia Kelp, PA-C
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
## 2024-02-29 NOTE — Addendum Note (Signed)
 Addended by: Angelia Kelp on: 02/29/2024 10:05 AM   Modules accepted: Orders, Level of Service

## 2024-02-29 NOTE — H&P (Signed)
 Admitting Physician: Avon Boers Covey Baller  Service: General surgery  CC: hernia repair  Subjective   HPI: Mary Chambers is an 53 y.o. female who is here for hernia repair  Past Medical History:  Diagnosis Date   Abscess 06/23/2021   Achilles tendonitis 01/16/2014   Overview:   Followed by [employer].  Treated with brace and diclofenac .   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  Follow-up with Dr. Marne Sings.  Watch for opportunities to increase physical activity within neuromuscular capabilities.   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   Transaminitis 06/23/2021   Urinary incontinence 06/03/2010   Vertebral osteomyelitis (HCC)     Past Surgical History:  Procedure Laterality Date   ABDOMINAL HYSTERECTOMY     AORTIC VALVE REPLACEMENT N/A 11/07/2021   Procedure: AORTIC VALVE REPLACEMENT USING A On-X AORTIC VALVE.;  Surgeon: Hilarie Lovely, MD;  Location: [facility] OR;  Service: Open Heart Surgery;  Laterality: N/A;   BACK SURGERY     BUBBLE STUDY  08/31/2021   Procedure: BUBBLE STUDY;  Surgeon: Sonny Dust, MD;  Location: [facility] ENDOSCOPY;  Service: Cardiovascular;;   CARPAL TUNNEL RELEASE Bilateral    CHOLECYSTECTOMY     IR FLUORO GUIDED NEEDLE PLC ASPIRATION/INJECTION  LOC  06/23/2021   LITHOTRIPSY     neck fusion     RIGHT/LEFT HEART CATH AND CORONARY ANGIOGRAPHY N/A 10/07/2021   Procedure: RIGHT/LEFT HEART CATH AND CORONARY ANGIOGRAPHY;  Surgeon: Lucendia Rusk, MD;  Location: [facility] INVASIVE CV LAB;  Service: Cardiovascular;  Laterality: N/A;   TEE WITHOUT CARDIOVERSION N/A 06/28/2021   Procedure: TRANSESOPHAGEAL ECHOCARDIOGRAM (TEE);  Surgeon: Lenise Quince, MD;  Location: [facility] ENDOSCOPY;  Service: Cardiovascular;  Laterality: N/A;   TEE WITHOUT CARDIOVERSION N/A 08/31/2021   Procedure: TRANSESOPHAGEAL ECHOCARDIOGRAM (TEE);  Surgeon: Sonny Dust, MD;  Location: [facility] ENDOSCOPY;  Service: Cardiovascular;  Laterality: N/A;   TEE WITHOUT CARDIOVERSION N/A 11/07/2021   Procedure: TRANSESOPHAGEAL ECHOCARDIOGRAM (TEE);  Surgeon: Hilarie Lovely, MD;  Location: [facility] OR;  Service: Open Heart Surgery;  Laterality: N/A;   TUBAL LIGATION      Family History  Problem Relation Age of Onset   Diabetes Mother    Hypertension Mother    Cancer Father    Parkinson's disease Father    Heart disease Father    Heart disease Sister    Heart disease Brother    Colon cancer Neg Hx    Rectal cancer Neg Hx    Stomach cancer Neg Hx     Social:  reports that she has never smoked. She has never been exposed to tobacco smoke. She has never used smokeless tobacco. She reports that she does not drink alcohol and does  not use drugs.  Allergies:  Allergies  Allergen Reactions   Sulfur Itching   Baclofen Hives   Dilaudid  [Hydromorphone ] Itching   Latex     When inside the body it causes irritation    Semaglutide  Nausea And Vomiting   Septra [Sulfamethoxazole-Trimethoprim] Hives   Vibramycin [Doxycycline] Itching   Neurontin [Gabapentin] Rash    Medications: Current Outpatient Medications  Medication Instructions   acetaminophen  (TYLENOL ) 1,000 mg, Every 6 hours PRN   amLODipine  (NORVASC ) 2.5 mg, Oral, Daily   aspirin  EC 81 mg, Oral, Daily, Swallow  whole.   atorvastatin  (LIPITOR) 10 MG tablet Every evening   cetirizine  (ZYRTEC ) 10 mg, Oral, Daily   ELDERBERRY PO 50 mg, Oral, Daily   escitalopram  (LEXAPRO ) 20 mg, Oral, Daily   fluticasone  (FLONASE ) 50 MCG/ACT nasal spray 2 sprays, Each Nare, Daily   furosemide  (LASIX ) 20 mg, Oral, Daily   gabapentin (NEURONTIN) 100 mg, Oral, 3 times daily   iron  polysaccharides (NIFEREX) 150 MG capsule 1 tablet, Oral, Daily PRN   loperamide  (IMODIUM ) 2 mg, Oral, As needed   Magnesium  250 mg, Daily PRN   metoprolol  tartrate (LOPRESSOR ) 12.5 mg, Oral, 2 times daily   Multiple Vitamin (MULTIVITAMIN WITH MINERALS) TABS tablet 2 tablets, Every morning   naproxen  (EC NAPROSYN ) 500 mg, Oral, 2 times daily with meals   ondansetron  (ZOFRAN ) 4 mg, Oral, Every 8 hours PRN   pantoprazole  (PROTONIX ) 40 mg, Oral, Daily   Potassium 198 mg, Daily PRN   terconazole (TERAZOL 7) 0.4 % vaginal cream Remove applicator tip and apply a small amount to the tip of a clean finger then apply to affected area daily   tiZANidine  (ZANAFLEX ) 4 mg, Oral, Daily at bedtime   vitamin C  (ASCORBIC ACID ) 500 mg, Oral, Daily   warfarin (COUMADIN ) 5 MG tablet Take 1/2 to 1 tablet daily or as directed by Coumadin  Clinic    ROS - all of the below systems have been reviewed with the patient and positives are indicated with bold text General: chills, fever or night sweats Eyes: blurry vision or double vision ENT: epistaxis or sore throat Allergy/Immunology: itchy/watery eyes or nasal congestion Hematologic/Lymphatic: bleeding problems, blood clots or swollen lymph nodes Endocrine: temperature intolerance or unexpected weight changes Breast: new or changing breast lumps or nipple discharge Resp: cough, shortness of breath, or wheezing CV: chest pain or dyspnea on exertion GI: as per HPI GU: dysuria, trouble voiding, or hematuria MSK: joint pain or joint stiffness Neuro: TIA or stroke symptoms Derm: pruritus and skin lesion  changes Psych: anxiety and depression  Objective   PE Blood pressure (!) 161/100, pulse 76, temperature 99.1 F (37.3 C), temperature source Oral, resp. rate 16, height 5\' 7"  (1.702 m), weight 123.4 kg, SpO2 93%. Constitutional: NAD; conversant; no deformities Eyes: Moist conjunctiva; no lid lag; anicteric; PERRL Neck: Trachea midline; no thyromegaly Lungs: Normal respiratory effort; no tactile fremitus CV: RRR; no palpable thrills; no pitting edema GI: Abd umbilical and left inguinal hernia, lower abdominal rash; no palpable hepatosplenomegaly MSK: Normal range of motion of extremities; no clubbing/cyanosis Psychiatric: Appropriate affect; alert and oriented x3 Lymphatic: No palpable cervical or axillary lymphadenopathy  Results for orders placed or performed during the hospital encounter of 02/29/24 (from the past 24 hours)  PT-INR Day of Surgery per protocol     Status: None   Collection Time: 02/29/24  1:40 PM  Result Value Ref Range   Prothrombin Time 13.3 11.4 - 15.2 seconds   INR 1.0 0.8 -  1.2  PTT Day of Surgery per protocol     Status: None   Collection Time: 02/29/24  1:40 PM  Result Value Ref Range   aPTT 27 24 - 36 seconds    Imaging Orders  No imaging studies ordered today     Assessment and Plan   Mary Chambers is an 53 y.o. female with left inguinal and umbilical hernia.  I recommended laparoscopic left inguinal hernia repair with mesh and primary closure of umbilical hernia defect.  We discussed the procedure, its risks, benefits and alternatives and the patient granted consent to proceed.     Junie Olds, MD  [facility] Use [email or web address] to contact on call provider

## 2024-02-29 NOTE — Op Note (Signed)
   Patient: Mary Chambers (1971/07/29, [id number])  Date of Surgery: 02/29/2024  Preoperative Diagnosis: UMBILICAL HERNIA (0.5 cm x 0.5cm on preoperative CT) AND LEFT INGUINAL HERNIA   Postoperative Diagnosis: UMBILICAL HERNIA (0.5 cm x 0.5cm on preoperative CT) AND LEFT INGUINAL HERNIA   Surgical Procedure: REPAIR, HERNIA, INGUINAL, LAPAROSCOPIC: 49650 (CPT) REPAIR, HERNIA, UMBILICAL, ADULT: 49591 (CPT)   Operative Team Members:  Surgeons and Role:    * Sehaj Kolden, Avon Boers, MD - Primary   Anesthesiologist: Jonne Netters, MD CRNA: Hunter Maha, CRNA   Anesthesia: General   Fluids:  Total I/O In: 150 [IV Piggyback:150] Out: -   Complications: None  Drains:  None  Specimen: None  Disposition:  PACU - hemodynamically stable.  Plan of Care: Discharge to home after PACU  Indications for Procedure: Mary Chambers is a 53 y.o. female who presented with an umbilical and left inguinal hernia.  I recommended laparoscopic left inguinal hernia repair with mesh and primary closure of umbilical hernia defect.  After a full discussion and all questions answered the patient granted consent to proceed.  Findings:  Technique: Transabdominal preperitoneal (TAPP) Hernia Location: Left inguinal, umbilical Mesh Size &Type:  Bard 3D max extra-large left sided mesh Mesh Fixation: Endo-Universal hernia stapler  Infection status: Patient: Private Patient Elective Case Case: Elective Infection Present At Time Of Surgery (PATOS): None   Description of Procedure:  The patient was positioned supine, padded and secured to the bed, with both arms tucked.  The abdomen was widely prepped and draped.  A time out procedure was performed.  A 1 cm infraumbilical incision was made.  The abdomen was entered without trauma to the underlying viscera.  The abdomen was insufflated to 15 mm of Hg.  A 12 mm trocar was inserted at the periumbilical incision.  Additional 5 mm trocars were  placed in the left and right abdomen.  There was no trauma to the underlying viscera.  There was an indirect hernia on the LEFT.  Utilizing a transabdominal pre peritoneal technique (TAPP), a horizontal incision was made in the peritoneum, immediately below the umbilicus.  Dissection was carried out in the pre peritoneal space down to the level of the hernia sac which was reduced into the peritoneal cavity completely.  The cord contents were parietalized and preserved.  A large pre peritoneal dissection was performed to uncover the direct, indirect, femoral and obturator spaces.  Cooper's ligament was uncovered medially and the psoas muscle uncovered laterally.  The mesh, as documented above, was opened and advanced into the pre peritoneal position so that it more than adequately covered the indirect, direct, femoral and obturator spaces.  The mesh laid flat, with no inferior folds and covered the entire myopectineal orifice.  The mesh was fixated with the endo-universal hernia stapler to Cooper's ligament and the posterior aspect of the rectus muscle.  The peritoneal flap was closed with the same device.  There were no peritoneal defects or exposed mesh at the conclusion.  The umbilical trocar was removed and the umbilical hernia fascial defect was closed with a 0 Vicryl suture.  The peritoneal cavity was completely desufflated, the trocars removed and the skin closed with 4-0 Monocryl subcuticular suture and skin glue.  All sponge and needle counts were correct at the end of the case.  Teddie Favre, MD General, Bariatric, & Minimally Invasive Surgery [facility], [place]

## 2024-02-29 NOTE — Discharge Instructions (Signed)
 Take your pain medicine as prescribed and follow-up with your surgeon.  Return if needed

## 2024-03-03 ENCOUNTER — Encounter (HOSPITAL_COMMUNITY): Payer: Self-pay | Admitting: Surgery

## 2024-03-06 ENCOUNTER — Emergency Department (HOSPITAL_COMMUNITY)

## 2024-03-06 ENCOUNTER — Other Ambulatory Visit: Payer: Self-pay

## 2024-03-06 ENCOUNTER — Encounter (HOSPITAL_COMMUNITY): Payer: Self-pay

## 2024-03-06 ENCOUNTER — Ambulatory Visit (HOSPITAL_BASED_OUTPATIENT_CLINIC_OR_DEPARTMENT_OTHER): Admitting: Family Medicine

## 2024-03-06 ENCOUNTER — Encounter (HOSPITAL_BASED_OUTPATIENT_CLINIC_OR_DEPARTMENT_OTHER): Payer: Self-pay | Admitting: Family Medicine

## 2024-03-06 ENCOUNTER — Emergency Department (HOSPITAL_COMMUNITY)
Admission: EM | Admit: 2024-03-06 | Discharge: 2024-03-07 | Disposition: A | Discharge status: Home / Self Care | Attending: Student | Admitting: Student

## 2024-03-06 VITALS — BP 133/73 | HR 67 | Ht 67.0 in | Wt 271.0 lb

## 2024-03-06 DIAGNOSIS — M48062 Spinal stenosis, lumbar region with neurogenic claudication: Secondary | ICD-10-CM

## 2024-03-06 DIAGNOSIS — R32 Unspecified urinary incontinence: Secondary | ICD-10-CM

## 2024-03-06 DIAGNOSIS — Z9889 Other specified postprocedural states: Secondary | ICD-10-CM | POA: Diagnosis not present

## 2024-03-06 DIAGNOSIS — N951 Menopausal and female climacteric states: Secondary | ICD-10-CM

## 2024-03-06 DIAGNOSIS — Z7901 Long term (current) use of anticoagulants: Secondary | ICD-10-CM | POA: Insufficient documentation

## 2024-03-06 DIAGNOSIS — N3000 Acute cystitis without hematuria: Secondary | ICD-10-CM | POA: Diagnosis not present

## 2024-03-06 DIAGNOSIS — Z91040 Latex allergy status: Secondary | ICD-10-CM | POA: Insufficient documentation

## 2024-03-06 DIAGNOSIS — Z8719 Personal history of other diseases of the digestive system: Secondary | ICD-10-CM | POA: Insufficient documentation

## 2024-03-06 DIAGNOSIS — Z79899 Other long term (current) drug therapy: Secondary | ICD-10-CM | POA: Diagnosis not present

## 2024-03-06 DIAGNOSIS — Z7982 Long term (current) use of aspirin: Secondary | ICD-10-CM | POA: Diagnosis not present

## 2024-03-06 DIAGNOSIS — I10 Essential (primary) hypertension: Secondary | ICD-10-CM | POA: Insufficient documentation

## 2024-03-06 DIAGNOSIS — Z9049 Acquired absence of other specified parts of digestive tract: Secondary | ICD-10-CM | POA: Diagnosis not present

## 2024-03-06 DIAGNOSIS — R109 Unspecified abdominal pain: Secondary | ICD-10-CM | POA: Diagnosis not present

## 2024-03-06 DIAGNOSIS — R1030 Lower abdominal pain, unspecified: Secondary | ICD-10-CM | POA: Diagnosis present

## 2024-03-06 LAB — URINALYSIS, ROUTINE W REFLEX MICROSCOPIC
Bilirubin Urine: NEGATIVE
Glucose, UA: NEGATIVE mg/dL
Hgb urine dipstick: NEGATIVE
Ketones, ur: NEGATIVE mg/dL
Nitrite: POSITIVE — AB
Protein, ur: NEGATIVE mg/dL
Specific Gravity, Urine: 1.012 (ref 1.005–1.030)
pH: 6 (ref 5.0–8.0)

## 2024-03-06 LAB — COMPREHENSIVE METABOLIC PANEL WITH GFR
ALT: 14 U/L (ref 0–44)
AST: 22 U/L (ref 15–41)
Albumin: 3.5 g/dL (ref 3.5–5.0)
Alkaline Phosphatase: 72 U/L (ref 38–126)
Anion gap: 9 (ref 5–15)
BUN: 19 mg/dL (ref 6–20)
CO2: 25 mmol/L (ref 22–32)
Calcium: 8.6 mg/dL — ABNORMAL LOW (ref 8.9–10.3)
Chloride: 106 mmol/L (ref 98–111)
Creatinine, Ser: 0.89 mg/dL (ref 0.44–1.00)
GFR, Estimated: >60 mL/min (ref >60)
Glucose, Bld: 99 mg/dL (ref 70–99)
Potassium: 4.2 mmol/L (ref 3.5–5.1)
Sodium: 140 mmol/L (ref 135–145)
Total Bilirubin: 0.9 mg/dL (ref 0.0–1.2)
Total Protein: 6.9 g/dL (ref 6.5–8.1)

## 2024-03-06 LAB — CBC
HCT: 37.3 % (ref 36.0–46.0)
Hemoglobin: 11.2 g/dL — ABNORMAL LOW (ref 12.0–15.0)
MCH: 25.6 pg — ABNORMAL LOW (ref 26.0–34.0)
MCHC: 30 g/dL (ref 30.0–36.0)
MCV: 85.2 fL (ref 80.0–100.0)
Platelets: 293 10*3/uL (ref 150–400)
RBC: 4.38 MIL/uL (ref 3.87–5.11)
RDW: 15.4 % (ref 11.5–15.5)
WBC: 8.4 10*3/uL (ref 4.0–10.5)
nRBC: 0 % (ref 0.0–0.2)

## 2024-03-06 LAB — PROTIME-INR
INR: 1.3 — ABNORMAL HIGH (ref 0.8–1.2)
Prothrombin Time: 16.8 s — ABNORMAL HIGH (ref 11.4–15.2)

## 2024-03-06 LAB — LIPASE, BLOOD: Lipase: 30 U/L (ref 11–51)

## 2024-03-06 MED ORDER — CEPHALEXIN 500 MG PO CAPS: 500.0000 mg | ORAL_CAPSULE | Freq: Two times a day (BID) | ORAL | 0 refills | Status: AC

## 2024-03-06 MED ORDER — SODIUM CHLORIDE 0.9 % IV SOLN
1.0000 g | Freq: Once | INTRAVENOUS | Status: AC
  Administered 2024-03-06: 1 g via INTRAVENOUS
  Filled 2024-03-06: qty 10

## 2024-03-06 MED ORDER — IOHEXOL 300 MG/ML  SOLN
100.0000 mL | Freq: Once | INTRAMUSCULAR | Status: AC | PRN
  Administered 2024-03-06: 100 mL via INTRAVENOUS

## 2024-03-06 MED ORDER — ACETAMINOPHEN 325 MG PO TABS
650.0000 mg | ORAL_TABLET | Freq: Once | ORAL | Status: AC
  Administered 2024-03-06: 650 mg via ORAL
  Filled 2024-03-06: qty 2
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
## 2024-03-06 NOTE — ED Triage Notes (Signed)
 Pt came in for abdominal pain. Pt had hernia surgery on Friday. Pt doctor wanted her to have a CT because of the heaviness in her lower abdomen.

## 2024-03-06 NOTE — Discharge Instructions (Addendum)
 You have a urinary tract infection.  There is no concerning changes in your CT scan related to your recent surgery.  Please follow-up with your surgeon as scheduled, complete the prescribed antibiotics for UTI as prescribed only entire course.  Follow-up with your primary care doctor and return to the ER with any new severe symptoms.

## 2024-03-06 NOTE — Progress Notes (Signed)
    Procedures performed today:    None.  Independent interpretation of notes and tests performed by another provider:   None.  Brief History, Exam, Impression, and Recommendations:    BP 133/73 (BP Location: Right Arm, Patient Position: Sitting, Cuff Size: Normal)   Pulse 67   Ht 5\' 7"  (1.702 m)   Wt 271 lb (122.9 kg)   SpO2 94%   BMI 42.44 kg/m   Menopausal symptoms -     Ambulatory referral to Obstetrics / Gynecology  Lumbar stenosis with neurogenic claudication  History of hernia surgery  Urinary incontinence, unspecified type  Patient has questions about possible medication to assist with menopausal symptoms.  She reports that she has had evaluation with OB/GYN, however for started on topical treatment and had issues with this as well as OB/GYN office.  She did look up Effexor and has questions about this medication. Patient is currently taking escitalopram  for underlying anxiety and depression.  Discussed that we often would not utilize SSRI and SNRI in conjunction due to risk with taking both medications together.  Ultimately, given ongoing symptoms as well as medical history, feel that she would be best served by having further evaluation with OB/GYN to discuss treatment options.  We have placed referral to alternative office for further evaluation  Concerns about incision sites healing from recent surgery.  Particularly wonders about area over right side of abdomen and whether it appears to be healing appropriately.  She has not had follow-up with surgeon, indicates that this was scheduled in July. All incision sites appear to be healing well, no evidence of infection with no erythema or drainage noted from any incision site.  Glue from surgery appears to still be in place and intact at all sites. Advised if noticing any other changes with incision site, recommend reaching out to surgeons office to schedule follow-up  Patient also presents with questions about completing  FL 2 form.  She reports that she has been having assistance at home from her daughter-in-law and was advised that she could have this form filled out in order to allow for her daughter-in-law to be paid for the assistance that she is providing.  She reports being instructed on having it filled out such that recommended level of care was for domiciliary.  Ultimately, patient is wanting assistance provided to her while she is in her own home.  She indicates needing assistance with bathing, particularly getting in and out of tub.  She also needs assistance with household chores, meal prep, does have underlying urinary incontinence.  Send recurrent symptoms and issues are related to chronic medical issues including lumbar stenosis.  She has had recent exacerbation with hernia surgery.  Ultimately, no specific additional services recommended or advised on by surgeon. Did complete FL 2 form for patient today.  Return in about 4 months (around 07/06/2024).  Spent 35 minutes on this patient encounter, including preparation, chart review, face-to-face counseling with patient and coordination of care, and documentation of encounter.    ___________________________________________ Charod Slawinski de [place], MD, ABFM, [facility] Sports Medicine [facility]

## 2024-03-06 NOTE — Patient Instructions (Signed)
 ?  Medication Instructions:  ?Your physician recommends that you continue on your current medications as directed. Please refer to the Current Medication list given to you today. ?--If you need a refill on any your medications before your next appointment, please call your pharmacy first. If no refills are authorized on file call the office.-- ? ? ? ?Follow-Up: ?Your next appointment:   ?Your physician recommends that you schedule a follow-up appointment in: 3 month follow-up with Dr. de Peru ? ?You will receive a text message or e-mail with a link to a survey about your care and experience with [place] today! We would greatly appreciate your feedback!  ? ?Thanks for letting us be apart of your health journey!!  ?Primary Care and Sports Medicine  ? ?Dr. Marcy Salvo de [place]  ? ?We encourage you to activate your patient portal called "MyChart".  Sign up information is provided on this After Visit Summary.  MyChart is used to connect with patients for Virtual Visits (Telemedicine).  Patients are able to view lab/test results, encounter notes, upcoming appointments, etc.  Non-urgent messages can be sent to your provider as well. To learn more about what you can do with MyChart, please visit --  [email or web address].    ?

## 2024-03-06 NOTE — ED Provider Notes (Signed)
 [place] EMERGENCY DEPARTMENT AT [facility] Provider Note   CSN: [id number] Arrival date & time: 03/06/24  1733     History  Chief Complaint  Patient presents with   Post-op Problem    Mary Chambers is a 53 y.o. female who is 6 days postop from left inguinal hernia and umbilical hernia repairs with Dr. Richad Champagne who presents today with a lower abdominal pressure and discomfort with movement.  Decreased urine output significantly over the last few days per patient.  No fevers chills nausea vomiting dysuria frequency, diarrhea or constipation.  Continues to pass flatus.  Said follow-up with surgeon as scheduled for July.  Did see her primary care doctor today but there was no mention of his abdominal pain and PCP note.  She states that she called her surgeon's office this evening and they directed her to come to the ED for further evaluation.  I have personally reviewed her medical records.  Patient hypertension mechanical aortic valve replacement 2023 currently on warfarin though she has been off of it for a few days for surgery.  HPI     Home Medications Prior to Admission medications   Medication Sig Start Date End Date Taking? Authorizing Provider  cephALEXin  (KEFLEX ) 500 MG capsule Take 1 capsule (500 mg total) by mouth 2 (two) times daily for 5 days. 03/06/24 03/11/24 Yes Antjuan Rothe, Anola Basques R, PA-C  acetaminophen  (TYLENOL ) 500 MG tablet Take 1,000 mg by mouth every 6 (six) hours as needed for moderate pain (pain score 4-6).    [provider]  amLODipine  (NORVASC ) 2.5 MG tablet Take 1 tablet (2.5 mg total) by mouth daily. Patient taking differently: Take 2.5 mg by mouth at bedtime. 05/22/23   Clearnce Curia, NP  aspirin  EC 81 MG tablet Take 1 tablet (81 mg total) by mouth daily. Swallow whole. 08/15/22   Sheryle Donning, MD  atorvastatin  (LIPITOR) 10 MG tablet TAKE 1 TABLET BY MOUTH EVERY DAY IN THE EVENING 07/26/23   Sheryle Donning, MD   cetirizine  (ZYRTEC ) 10 MG tablet Take 1 tablet (10 mg total) by mouth daily. 11/23/23   de [place], Raymond J, MD  ELDERBERRY PO Take 50 mg by mouth daily.    [provider]  escitalopram  (LEXAPRO ) 20 MG tablet TAKE 1 TABLET BY MOUTH EVERY DAY Patient taking differently: Take 20 mg by mouth at bedtime. 01/14/24   de [place], Raymond J, MD  fluticasone  (FLONASE ) 50 MCG/ACT nasal spray Place 2 sprays into both nostrils daily. 11/30/23   de [place], Alonza Jansky, MD  furosemide  (LASIX ) 20 MG tablet Take 1 tablet (20 mg total) by mouth daily. Patient taking differently: Take 20 mg by mouth daily as needed for edema. 08/15/22   Sheryle Donning, MD  gabapentin (NEURONTIN) 100 MG capsule Take 100 mg by mouth 3 (three) times daily.    [provider]  iron  polysaccharides (NIFEREX) 150 MG capsule Take 1 tablet by mouth daily as needed (low iron ).    [provider]  loperamide  (IMODIUM ) 2 MG capsule Take 1 capsule (2 mg total) by mouth as needed for diarrhea or loose stools. Patient taking differently: Take 4 mg by mouth as needed for diarrhea or loose stools. 07/01/21   Samtani, Jai-Gurmukh, MD  Magnesium  250 MG CAPS Take 250 mg by mouth daily as needed (feet burning).    [provider]  metoprolol  tartrate (LOPRESSOR ) 25 MG tablet Take 0.5 tablets (12.5 mg total) by mouth 2 (two) times daily. 08/06/23  Sheryle Donning, MD  Multiple Vitamin (MULTIVITAMIN WITH MINERALS) TABS tablet Take 2 tablets by mouth in the morning.    [provider]  naproxen  (EC NAPROSYN ) 500 MG EC tablet Take 1 tablet (500 mg total) by mouth 2 (two) times daily with a meal. 10/26/23 10/25/24  Evans, Alexandra, PA-C  ondansetron  (ZOFRAN ) 4 MG tablet Take 4 mg by mouth every 8 (eight) hours as needed for vomiting or nausea.    [provider]  oxyCODONE -acetaminophen  (PERCOCET) 5-325 MG tablet Take 1 tablet by mouth every 4 (four) hours as needed for severe pain (pain score 7-10).  02/29/24 02/28/25  Stechschulte, Avon Boers, MD  pantoprazole  (PROTONIX ) 40 MG tablet Take 1 tablet (40 mg total) by mouth daily. 06/18/23   de [place], Raymond J, MD  Potassium 99 MG TABS Take 198 mg by mouth daily as needed (when taking lasix ).    [provider]  terconazole  (TERAZOL 7 ) 0.4 % vaginal cream Remove applicator tip and apply a small amount to the tip of a clean finger then apply to affected area daily 02/29/24   Burnette, Jennifer M, PA-C  tiZANidine  (ZANAFLEX ) 4 MG tablet Take 4 mg by mouth at bedtime.    [provider]  vitamin C  (ASCORBIC ACID ) 250 MG tablet Take 500 mg by mouth daily.    [provider]  warfarin (COUMADIN ) 5 MG tablet Take 1/2 to 1 tablet daily or as directed by Coumadin  Clinic Patient taking differently: Take 2.5 mg at night on Monday, Take 5 mg at night on Tuesday through Sunday 01/30/24   Sheryle Donning, MD      Allergies    Sulfur, Baclofen, Dilaudid  [hydromorphone ], Latex, Semaglutide , Septra [sulfamethoxazole-trimethoprim], and Vibramycin [doxycycline]    Review of Systems   Review of Systems  Constitutional: Negative.   HENT: Negative.    Gastrointestinal:  Positive for abdominal pain.  Genitourinary:  Positive for decreased urine volume.    Physical Exam Updated Vital Signs BP 134/61 (BP Location: Right Wrist)   Pulse 61   Temp 98.8 F (37.1 C) (Oral)   Resp 18   SpO2 96%  Physical Exam Vitals and nursing note reviewed.  Constitutional:      Appearance: She is obese. She is not ill-appearing or toxic-appearing.  HENT:     Head: Normocephalic and atraumatic.     Mouth/Throat:     Mouth: Mucous membranes are moist.     Pharynx: No oropharyngeal exudate or posterior oropharyngeal erythema.  Eyes:     General:        Right eye: No discharge.        Left eye: No discharge.     Conjunctiva/sclera: Conjunctivae normal.  Cardiovascular:     Rate and Rhythm: Normal rate and regular rhythm.     Pulses: Normal  pulses.     Heart sounds: Normal heart sounds. No murmur heard. Pulmonary:     Effort: Pulmonary effort is normal. No respiratory distress.     Breath sounds: Normal breath sounds. No wheezing or rales.  Abdominal:     General: A surgical scar is present. Bowel sounds are normal. There is no distension.     Palpations: Abdomen is soft.     Tenderness: There is abdominal tenderness in the suprapubic area and left lower quadrant. There is no right CVA tenderness, left CVA tenderness, guarding or rebound.    Musculoskeletal:        General: No deformity.     Cervical back: Neck supple.  Skin:    General: Skin is warm and dry.  Neurological:     Mental Status: She is alert. Mental status is at baseline.  Psychiatric:        Mood and Affect: Mood normal.     ED Results / Procedures / Treatments   Labs (all labs ordered are listed, but only abnormal results are displayed) Labs Reviewed  COMPREHENSIVE METABOLIC PANEL WITH GFR - Abnormal; Notable for the following components:      Result Value   Calcium  8.6 (*)    All other components within normal limits  CBC - Abnormal; Notable for the following components:   Hemoglobin 11.2 (*)    MCH 25.6 (*)    All other components within normal limits  URINALYSIS, ROUTINE W REFLEX MICROSCOPIC - Abnormal; Notable for the following components:   APPearance HAZY (*)    Nitrite POSITIVE (*)    Leukocytes,Ua LARGE (*)    Bacteria, UA MANY (*)    All other components within normal limits  PROTIME-INR - Abnormal; Notable for the following components:   Prothrombin Time 16.8 (*)    INR 1.3 (*)    All other components within normal limits  LIPASE, BLOOD    EKG None  Radiology CT ABDOMEN PELVIS W CONTRAST Result Date: 03/06/2024 CLINICAL DATA:  Acute nonlocalized abdominal pain EXAM: CT ABDOMEN AND PELVIS WITH CONTRAST TECHNIQUE: Multidetector CT imaging of the abdomen and pelvis was performed using the standard protocol following bolus  administration of intravenous contrast. RADIATION DOSE REDUCTION: This exam was performed according to the departmental dose-optimization program which includes automated exposure control, adjustment of the mA and/or kV according to patient size and/or use of iterative reconstruction technique. CONTRAST:  OMNIPAQUE  IOHEXOL  300 MG/ML  SOLN COMPARISON:  02/29/2024 FINDINGS: Lower chest: No acute abnormality. Hepatobiliary: No focal liver abnormality is seen. Status post cholecystectomy. No biliary dilatation. Pancreas: Unremarkable Spleen: Unremarkable Adrenals/Urinary Tract: The adrenal glands are unremarkable. The kidneys are normal in position. Marked left and mild right renal cortical atrophy. Prominent bilateral fetal lobulation. The kidneys are otherwise unremarkable. The bladder is largely decompressed. There is, however, mild superimposed perivesicular inflammatory stranding which may reflect an underlying infectious or inflammatory cystitis. Stomach/Bowel: Stomach is within normal limits. Appendix appears normal. No evidence of bowel wall thickening, distention, or inflammatory changes. Vascular/Lymphatic: Aortic atherosclerosis. No enlarged abdominal or pelvic lymph nodes. Reproductive: Status post hysterectomy. No adnexal masses. Other: Surgical changes of left lower quadrant/left pelvic abdominal wall hernia repair are identified utilizing mesh which appears attached to the deep abdominal fascia superiorly at the level of the umbilicus and to the pubic symphysis inferiorly. Since the prior examination, there has developed a increased amount of bland appearing fluid superficial to the mesh best seen on image # 78/2. No definite loculation or rim enhancement identified suggest inflammatory component. There is, however, increasing infiltration within the surrounding subcutaneous soft tissues as well as the space of Retzius and while this may simply represent evolving postsurgical changes, inflammatory  changes related to cellulitis/soft tissue infection could appear similarly. No recurrent abdominal wall hernia. Musculoskeletal: T10-11 and L1-2 lumbar fusion with instrumentation with bilateral laminectomy and resection of the spinous processes L2-3 are noted. No acute bone abnormality. No lytic or blastic bone lesion. Osseous structures are age appropriate. IMPRESSION: 1. Surgical changes of left lower quadrant/left pelvic abdominal wall hernia repair utilizing mesh which appears attached to the deep abdominal fascia superiorly at the level of the umbilicus and to the pubic symphysis  inferiorly. Since the prior examination, there has developed a increased amount of bland appearing fluid superficial to the mesh. No definite loculation or rim enhancement identified suggest inflammatory component. There is, however, increasing infiltration within the surrounding subcutaneous soft tissues as well as the Space of Retzius and while this may simply represent evolving postsurgical changes, inflammatory changes related to cellulitis/soft tissue infection could appear similarly. 2. Mild perivesicular inflammatory stranding which may reflect an underlying infectious or inflammatory cystitis. Correlation with urinalysis is recommended. 3. Marked left and mild right renal cortical atrophy. 4. Aortic atherosclerosis. Aortic Atherosclerosis (ICD10-I70.0). Electronically Signed   By: Worthy Heads M.D.   On: 03/06/2024 21:55    Procedures Procedures    Medications Ordered in ED Medications  acetaminophen  (TYLENOL ) tablet 650 mg (650 mg Oral Given 03/06/24 2014)  iohexol  (OMNIPAQUE ) 300 MG/ML solution 100 mL (100 mLs Intravenous Contrast Given 03/06/24 2119)  cefTRIAXone  (ROCEPHIN ) 1 g in sodium chloride  0.9 % 100 mL IVPB (1 g Intravenous New Bag/Given 03/06/24 2310)    ED Course/ Medical Decision Making/ A&P Clinical Course as of 03/07/24 0000  Thu Mar 06, 2024  2334 Consult to Dr. Andy Bannister, surgeon who agrees that CT  imaging reveals normal postoperative changes related to the mesh.  No additional recommendations from surgery standpoint.  I appreciate her collaboration in the care of this patient.  [RS]    Clinical Course User Index [RS] Treanna Dumler, Adelle Agent, PA-C                                 Medical Decision Making 53 year old female who presents with concern for lower abdominal pain postoperatively, pelvic pressure.  Hypertension intake and vitals otherwise normal.  Cardiopulmonary dam is unremarkable, normal exam with mild suprapubic and left lower quadrant tenderness palpation.  Normal healing of surgical incisions.  Amount and/or Complexity of Data Reviewed Labs: ordered.    Details: CBC with hemoglobin 11.2 near patient's baseline.  CMP unremarkable with normal creatinine of 0.89.  INR 1.3 on warfarin.  Lipase is normal, UA with evidence of infection with positive nitrites large leuks and many bacteria.   Radiology:     Details: CT abdomen pelvis with some fluid superficial to the left pelvic mesh without loculation or definitive inflammatory component though there is some new stranding infiltration from the surrounding soft tissues.  Findings consistent with cystitis on CT as well.  Risk Prescription drug management.   Clinical patient was consistent with urinary tract infection.  Patient initiated on antibiotics in the ED and will be discharged with oral antibiotic prescription.  Clinical concern for emergent underlying condition or postoperative complication no report for the ED workup or patient management is exceedingly low.  Lateisha  voiced understanding of her medical evaluation and treatment plan. Each of their questions answered to their expressed satisfaction.  Return precautions were given.  Patient is well-appearing, stable, and was discharged in good condition.  This chart was dictated using voice recognition software, Dragon. Despite the best efforts of this provider to proofread  and correct errors, errors may still occur which can change documentation meaning.        Final Clinical Impression(s) / ED Diagnoses Final diagnoses:  Acute cystitis without hematuria    Rx / DC Orders ED Discharge Orders          Ordered    cephALEXin  (KEFLEX ) 500 MG capsule  2 times daily  03/06/24 2333              Lylian Sanagustin, Adelle Agent, PA-C 03/07/24 0000    Karlyn Overman, MD 03/07/24 1191

## 2024-03-07 ENCOUNTER — Ambulatory Visit (INDEPENDENT_AMBULATORY_CARE_PROVIDER_SITE_OTHER): Payer: Self-pay | Admitting: Cardiology

## 2024-03-07 ENCOUNTER — Ambulatory Visit

## 2024-03-07 ENCOUNTER — Telehealth: Payer: Self-pay | Admitting: *Deleted

## 2024-03-07 DIAGNOSIS — G4739 Other sleep apnea: Secondary | ICD-10-CM | POA: Diagnosis not present

## 2024-03-07 DIAGNOSIS — M5116 Intervertebral disc disorders with radiculopathy, lumbar region: Secondary | ICD-10-CM | POA: Diagnosis not present

## 2024-03-07 DIAGNOSIS — Z7901 Long term (current) use of anticoagulants: Secondary | ICD-10-CM

## 2024-03-07 DIAGNOSIS — Z952 Presence of prosthetic heart valve: Secondary | ICD-10-CM

## 2024-03-07 DIAGNOSIS — I351 Nonrheumatic aortic (valve) insufficiency: Secondary | ICD-10-CM | POA: Diagnosis not present

## 2024-03-07 NOTE — Telephone Encounter (Signed)
 Called pt since she was in the ER yesterday and had an INR done and prescribed cephalexin -does not interact.   Advised of the voicemail that since she had an INR done last night, we could use it and she does not have to come in for an appointment; unfortunately had to leave a message for the pt to call back

## 2024-03-14 ENCOUNTER — Telehealth: Payer: Self-pay | Admitting: Cardiology

## 2024-03-14 ENCOUNTER — Telehealth: Payer: Self-pay | Admitting: Nurse Practitioner

## 2024-03-14 DIAGNOSIS — R002 Palpitations: Secondary | ICD-10-CM

## 2024-03-14 DIAGNOSIS — G4733 Obstructive sleep apnea (adult) (pediatric): Secondary | ICD-10-CM

## 2024-03-14 DIAGNOSIS — R5383 Other fatigue: Secondary | ICD-10-CM

## 2024-03-14 NOTE — Telephone Encounter (Unsigned)
 Copied from CRM [id number]. Topic: Referral - Question >> Mar 14, 2024 10:11 AM Everette C wrote: Reason for CRM: Princess with [employer] has called to find out where the patient was potentially referred for the sleep study. Princess would also like to follow up on previous discussions between the patient and their PCP about a possible prescription for a wheelchair as well. Please contact Princess when possible to discuss further at [phone] ext [phone]    Darryll Eng has contacted [employer] as well as [facility] and been told that no prescription can be found at either location.

## 2024-03-14 NOTE — Telephone Encounter (Signed)
 Patient calls stating that she had umbilical hernia repair on 02/29/24 and had been having regular bowel movements until yesterday. States that she had to somewhat strain to have a bowel movement which resulted in brb in the toilet, a few dime sized blood clots. Patient says since that bowel movement yesterday, she has continued to experience spotting of brb on tissue with each trip to the bathroom (in total around 6 times). Patient denies any rectal pain, melena. Does note some abdominal tenderness at the recent surgical site but no other abdominal pain noted. Denies dizziness, shortness of breath.  Patient does have a history of internal hemorrhoids. She is not taking any fiber etc. Patient is advised to purchase Benefiber OTC, start at 1 teaspoon daily and work her way to 1 tablespoon daily to help avoid straining/constipation. She is also advised that she may purchase hydrocortisone cream over the counter and use as directed. Increase water  intake to 64 oz daily.  Any additional recommendations?

## 2024-03-14 NOTE — Telephone Encounter (Signed)
 Patient has been advised of recommendation as per Dr General Kenner. Advised if worsening bleeding, pain, fever, she should be seen in the ER for sooner evaluation.

## 2024-03-14 NOTE — Telephone Encounter (Signed)
 Thanks for letting me know. Certainly could be hemorrhoidal bleeding, while diverticular bleeding also possible (both of these seen on last colonoscopy). Agree with your recommendations. She is on coumadin . If bleeding is worsening, especially heading into the weekend (currently Friday PM), her option over the weekend could be to go to the ED for further evaluation. Thanks

## 2024-03-14 NOTE — Telephone Encounter (Signed)
 See 5/08 encounter.   Mary Chambers with [facility] says a sleep study was ordered for patient and she was advised to call if she hasn't heard from the sleep lab by 6/16. Mary Chambers says she contacted [facility] sleep lab and and they informed her that they haven't received anything. Mary Chambers would like to follow up on the status of order/referral. Please advise.  Phone#: [phone] (ext#: [phone])

## 2024-03-14 NOTE — Telephone Encounter (Signed)
 Patient is requesting to speak to a nurse to discuss rectal bleeding. It has been happening since 5pm yesterday and she is concerned. Please advise.

## 2024-03-17 NOTE — Telephone Encounter (Signed)
 Lvm for princess at [employer] regarding wheelchair referral and informed her to contact patient's cardiologist.

## 2024-03-18 NOTE — Telephone Encounter (Addendum)
**Note De-Identified Zacharias Ridling Obfuscation** I called Princess back at the telephone number provided but got no answer so I left a detailed message on her VM advising her that the pt will have a CPAP Titration at [facility] located at [address] in [place], [place] [address].   I checked the pts active orders and found that CPAP Titration order has moved to [facility] and scheduled but the pt has not had her CPAP Titration yet.  CPAP Titration re-ordered and transferred to the sleep lab with message attached READY to be scheduled.  I called the pt and provided her with the sleep labs phone number so she can contact them to schedule her CPAP Titration. She verbalized understanding and thanked me for calling her to discuss.

## 2024-03-19 ENCOUNTER — Telehealth (HOSPITAL_BASED_OUTPATIENT_CLINIC_OR_DEPARTMENT_OTHER): Payer: Self-pay | Admitting: *Deleted

## 2024-03-19 ENCOUNTER — Other Ambulatory Visit (HOSPITAL_BASED_OUTPATIENT_CLINIC_OR_DEPARTMENT_OTHER): Payer: Self-pay | Admitting: Family Medicine

## 2024-03-19 ENCOUNTER — Ambulatory Visit: Attending: Cardiology | Admitting: *Deleted

## 2024-03-19 ENCOUNTER — Other Ambulatory Visit (HOSPITAL_BASED_OUTPATIENT_CLINIC_OR_DEPARTMENT_OTHER): Payer: Self-pay | Admitting: Cardiology

## 2024-03-19 DIAGNOSIS — Z7901 Long term (current) use of anticoagulants: Secondary | ICD-10-CM

## 2024-03-19 DIAGNOSIS — Z952 Presence of prosthetic heart valve: Secondary | ICD-10-CM

## 2024-03-19 DIAGNOSIS — Q2381 Bicuspid aortic valve: Secondary | ICD-10-CM

## 2024-03-19 DIAGNOSIS — I351 Nonrheumatic aortic (valve) insufficiency: Secondary | ICD-10-CM | POA: Diagnosis not present

## 2024-03-19 LAB — POCT INR: INR: 1.9 — AB (ref 2.0–3.0)

## 2024-03-19 NOTE — Telephone Encounter (Signed)
 Spoke with patient this was sent to two different vendors in March. Advised patient to reach out to vendors and if they needed anything further from us  they could fax a request over or she could reach back out to us  with verbal understanding

## 2024-03-19 NOTE — Telephone Encounter (Signed)
 Copied from CRM [id number]. Topic: Clinical - Order For Equipment >> Mar 19, 2024 11:35 AM Stanly Early wrote: Reason for CRM: Patient is requesting a wheelchair, a message was sent on 6/13 and there is no update. Princess stated its medical need and would like for it to be shipped to the patients house.

## 2024-03-19 NOTE — Patient Instructions (Signed)
 Description   Continue taking warfarin 1 tablet daily, except 1/2 tablet every Monday. Keep the number of servings of green leafy vegetables consistent in your diet and the V8 green drink..  Recheck in 3 weeks Coumadin  Clinic [phone] call with medication changes and or bleeding.

## 2024-03-19 NOTE — Telephone Encounter (Signed)
 Warfarin 5mg  S/P AVR (aortic valve replacement)  Last INR 03/19/24 Last OV 02/15/24

## 2024-03-20 ENCOUNTER — Telehealth (HOSPITAL_BASED_OUTPATIENT_CLINIC_OR_DEPARTMENT_OTHER): Payer: Self-pay | Admitting: *Deleted

## 2024-03-20 NOTE — Telephone Encounter (Signed)
 Noted [place] removed as pcp

## 2024-03-20 NOTE — Telephone Encounter (Signed)
 Copied from CRM [id number]. Topic: Appointments - Transfer of Care >> Mar 20, 2024  1:13 PM Jenice Mitts wrote: Pt is requesting to transfer FROM: Mary Chambers Pt is requesting to transfer TO: [name] Reason for requested transfer: Feels the care would be better It is the responsibility of the team the patient would like to transfer to (Dr. [name]) to reach out to the patient if for any reason this transfer is not acceptable.

## 2024-03-23 ENCOUNTER — Ambulatory Visit (HOSPITAL_BASED_OUTPATIENT_CLINIC_OR_DEPARTMENT_OTHER): Attending: Cardiology | Admitting: Cardiology

## 2024-03-23 DIAGNOSIS — Z952 Presence of prosthetic heart valve: Secondary | ICD-10-CM | POA: Insufficient documentation

## 2024-03-23 DIAGNOSIS — Z7901 Long term (current) use of anticoagulants: Secondary | ICD-10-CM | POA: Insufficient documentation

## 2024-03-23 DIAGNOSIS — R5383 Other fatigue: Secondary | ICD-10-CM | POA: Diagnosis not present

## 2024-03-23 DIAGNOSIS — G4733 Obstructive sleep apnea (adult) (pediatric): Secondary | ICD-10-CM | POA: Insufficient documentation

## 2024-03-23 DIAGNOSIS — I351 Nonrheumatic aortic (valve) insufficiency: Secondary | ICD-10-CM | POA: Diagnosis present

## 2024-03-23 DIAGNOSIS — I10 Essential (primary) hypertension: Secondary | ICD-10-CM | POA: Insufficient documentation

## 2024-03-23 DIAGNOSIS — R002 Palpitations: Secondary | ICD-10-CM | POA: Diagnosis not present

## 2024-03-24 NOTE — Procedures (Signed)
   Darryle Law [facility] [address] [place], [place] [address] Tel: [phone]   Fax: [phone]  Titration Interpretation  Patient Name:  Mary Chambers, Mary Chambers Date:  03/23/2024 Referring Physician:  Wilbert Bihari, MD  Indications for Polysomnography The patient is a 53 year-old Female who is 5' 7 and weighs 271.0 lbs. Her BMI equals 42.5.  A full night titration treatment study was performed.  Medication  Estroven   Warfarin  Tizanidine   Naproxen   Metoprolol  Tartrate  Gabapentin  Escitalopram   Amlodipine    Polysomnogram Data A full night polysomnogram recorded the standard physiologic parameters including EEG, EOG, EMG, EKG, nasal and oral airflow.  Respiratory parameters of chest and abdominal movements were recorded with Respiratory Inductance Plethysmography belts.  Oxygen saturation was recorded by pulse oximetry.   Sleep Architecture The total recording time of the polysomnogram was 355.9 minutes.  The total sleep time was 315.0 minutes.  The patient spent 3.8% of total sleep time in Stage N1, 37.3% in Stage N2, 46.7% in Stages N3, and 12.2% in REM.  Sleep latency was 5.5 minutes.  REM latency was 129.0 minutes.  Sleep Efficiency was 88.5%.  Wake after Sleep Onset time was 34.5 minutes.  Titration Summary The patient was titrated at pressures ranging from 7 cm/H20 up to 10 cm/H20 with supplemental oxygen at 3L.  The last pressure used in the study was 9 cm/H20 with supplemental oxygen at O2: 3L.  Respiratory Events The polysomnogram revealed a presence of 0 obstructive, 0central, and 0 mixed apneas resulting in an Apnea index of 0 events per hour.  There were 17 hypopneas (>=3% desaturation and/or arousal) resulting in an Apnea\Hypopnea Index (AHI >=3% desaturation and/or arousal) of 3.2 events per hour.  There were 16 hypopneas (>=4% desaturation) resulting in an Apnea\Hypopnea Index (AHI >=4% desaturation) of 3.0 events per hour.  There  were 3 Respiratory Effort Related Arousals resulting in a RERA index of 0.6 events per hour. The Respiratory Disturbance Index is 3.8 events per hour.  The snore index was 0 events per hour.  Mean oxygen saturation was 90.2%.  The lowest oxygen saturation during sleep was 84.0%.  Time spent <=88% oxygen saturation was 140.1 minutes (39.5%).  Limb Activity There were 0- limb movements recorded.    Cardiac Summary The average pulse rate was 67.5 bpm.  The minimum pulse rate was 55.0 bpm while the maximum pulse rate was 96.0 bpm.  Cardiac rhythm was normal/abnormal.  Diagnosis:  Obstructive Sleep Apnea Successful BiPAP titration  Recommendations:  Recommend a trial of CPAP at at 9cm H2O with heated humidity, EPR of 3 and small ResMed N20 nasal mask with 3L O2. The patient should be counseled in good sleep hygiene and avoid sleeping in the supine position. Encourage patient to avoid driving when sleepy Followup in Sleep clinic in 6 weeks.   This study was personally reviewed and electronically signed by: Wilbert Bihari, MD Accredited Board Certified in Sleep Medicine Date/Time: 03/24/2024 9:48PM

## 2024-03-25 ENCOUNTER — Encounter (HOSPITAL_BASED_OUTPATIENT_CLINIC_OR_DEPARTMENT_OTHER): Payer: Self-pay | Admitting: Family Medicine

## 2024-03-25 DIAGNOSIS — M25511 Pain in right shoulder: Secondary | ICD-10-CM | POA: Diagnosis not present

## 2024-03-25 DIAGNOSIS — M7501 Adhesive capsulitis of right shoulder: Secondary | ICD-10-CM | POA: Diagnosis not present

## 2024-03-25 DIAGNOSIS — M7542 Impingement syndrome of left shoulder: Secondary | ICD-10-CM | POA: Diagnosis not present

## 2024-03-25 DIAGNOSIS — M25512 Pain in left shoulder: Secondary | ICD-10-CM | POA: Diagnosis not present

## 2024-03-25 NOTE — Addendum Note (Signed)
 Addended by: HERMINE LATUS R on: 03/25/2024 03:33 PM   Modules accepted: Orders

## 2024-03-26 ENCOUNTER — Ambulatory Visit (INDEPENDENT_AMBULATORY_CARE_PROVIDER_SITE_OTHER)

## 2024-03-26 ENCOUNTER — Ambulatory Visit (INDEPENDENT_AMBULATORY_CARE_PROVIDER_SITE_OTHER): Admitting: Podiatry

## 2024-03-26 ENCOUNTER — Encounter: Payer: Self-pay | Admitting: Podiatry

## 2024-03-26 DIAGNOSIS — M7731 Calcaneal spur, right foot: Secondary | ICD-10-CM

## 2024-03-26 DIAGNOSIS — M722 Plantar fascial fibromatosis: Secondary | ICD-10-CM

## 2024-03-26 NOTE — Progress Notes (Signed)
  Subjective:  Patient ID: Mary Chambers, female    DOB: [birth year],  MRN: [id number]  Chief Complaint  Patient presents with   Plantar Fasciitis    My right foot is still giving me a fit.  It hurts on the bottom of the heel and on the sides.    53 y.o. female presents with the above complaint. History confirmed with patient. She returns for follow-up her left foot is doing much better and is relatively pain-free the right foot continues to be painful the injection did not help on the side  Objective:  Physical Exam: warm, good capillary refill, no trophic changes or ulcerative lesions, normal DP and PT pulses, normal sensory exam, and dystrophic mycotic second toenail bilateral.  Right Foot: point tenderness over the heel pad, tenderness at Achilles tendon insertion, and gastrocnemius equinus is noted with a positive silverskiold test  Radiographs taken of the right foot shows a plantar calcaneal spur Assessment:   1. Plantar fasciitis   2. Heel spur, right      Plan:  Patient was evaluated and treated and all questions answered.  She returns for follow-up today with her chronic recurrent right plantar fasciitis her x-rays indicate a plantar heel spur which is inflamed as well and she has had no improvement with injection therapy.  She is not a candidate for NSAID use due to her warfarin therapy.  With this recurrent issue for her I recommend MRI and we consider surgical options.  MRI has been ordered to help us  plan for this to determine if endoscopic plantar fasciotomy and gastrocnemius recession will be sufficient or if the heel spur resection is necessary as well.  She does have a history of foot drop on the side and I expect the gastrocnemius recession should help with this as well.  Follow-up with me after MRI to reevaluate.  In the interim rest and immobilization in a cam walker boot for support which was dispensed today.  No follow-ups on file.

## 2024-03-26 NOTE — Patient Instructions (Signed)

## 2024-03-27 ENCOUNTER — Telehealth: Payer: Self-pay

## 2024-03-27 NOTE — Telephone Encounter (Signed)
 Notified patient of successful PAP Titration, order for new device and supplies sent to [facility] today.

## 2024-03-27 NOTE — Telephone Encounter (Signed)
-----   Message from Wilbert Bihari sent at 03/24/2024  9:52 PM EDT ----- Please let patient know that they had a successful PAP titration and let DME know that orders are in EPIC.  Please set up 6 week OV with me.

## 2024-03-31 ENCOUNTER — Ambulatory Visit (HOSPITAL_BASED_OUTPATIENT_CLINIC_OR_DEPARTMENT_OTHER): Payer: Medicaid Other | Admitting: Family Medicine

## 2024-04-01 DIAGNOSIS — G4739 Other sleep apnea: Secondary | ICD-10-CM | POA: Diagnosis not present

## 2024-04-01 DIAGNOSIS — M5116 Intervertebral disc disorders with radiculopathy, lumbar region: Secondary | ICD-10-CM | POA: Diagnosis not present

## 2024-04-02 ENCOUNTER — Ambulatory Visit: Admitting: Podiatry

## 2024-04-08 ENCOUNTER — Other Ambulatory Visit (HOSPITAL_BASED_OUTPATIENT_CLINIC_OR_DEPARTMENT_OTHER): Payer: Self-pay | Admitting: Cardiology

## 2024-04-08 ENCOUNTER — Ambulatory Visit
Admission: RE | Admit: 2024-04-08 | Discharge: 2024-04-08 | Disposition: A | Discharge status: Home / Self Care | Source: Ambulatory Visit | Attending: Podiatry | Admitting: Podiatry

## 2024-04-08 ENCOUNTER — Telehealth: Payer: Self-pay

## 2024-04-08 DIAGNOSIS — M7731 Calcaneal spur, right foot: Secondary | ICD-10-CM

## 2024-04-08 DIAGNOSIS — M722 Plantar fascial fibromatosis: Secondary | ICD-10-CM | POA: Diagnosis not present

## 2024-04-08 DIAGNOSIS — R600 Localized edema: Secondary | ICD-10-CM | POA: Diagnosis not present

## 2024-04-08 NOTE — Telephone Encounter (Signed)
 Pt's pharmacy is requesting a refill on medication amoxicillin . This medication is not listed on pt's medication list. Would Dr. Lonni like to prescribed this medication? Please address

## 2024-04-08 NOTE — Telephone Encounter (Signed)
 Attempting to contact patient to confirm she is coming to MRI Appointment, No answer.  WENDI Dixon CMA

## 2024-04-09 ENCOUNTER — Ambulatory Visit: Attending: Cardiology

## 2024-04-09 ENCOUNTER — Telehealth: Admitting: Physician Assistant

## 2024-04-09 ENCOUNTER — Encounter

## 2024-04-09 DIAGNOSIS — Z7901 Long term (current) use of anticoagulants: Secondary | ICD-10-CM | POA: Diagnosis not present

## 2024-04-09 DIAGNOSIS — I351 Nonrheumatic aortic (valve) insufficiency: Secondary | ICD-10-CM

## 2024-04-09 DIAGNOSIS — Z952 Presence of prosthetic heart valve: Secondary | ICD-10-CM

## 2024-04-09 DIAGNOSIS — B370 Candidal stomatitis: Secondary | ICD-10-CM

## 2024-04-09 DIAGNOSIS — M25511 Pain in right shoulder: Secondary | ICD-10-CM | POA: Diagnosis not present

## 2024-04-09 LAB — POCT INR: INR: 3.2 — AB (ref 2.0–3.0)

## 2024-04-09 MED ORDER — NYSTATIN 100000 UNIT/ML MT SUSP
5.0000 mL | Freq: Four times a day (QID) | OROMUCOSAL | 0 refills | Status: DC
Start: 2024-04-09 — End: 2024-04-20

## 2024-04-09 NOTE — Patient Instructions (Signed)
 Tobias CHRISTELLA Linsey, thank you for joining [name], PA-C for today's virtual visit.  While this provider is not your primary care provider (PCP), if your PCP is located in our provider database this encounter information will be shared with them immediately following your visit.   A [facility] MyChart account gives you access to today's visit and all your visits, tests, and labs performed at [facility]  click here if you don't have a [facility] MyChart account or go to [email or web address]  Consent: (Patient) Mary Chambers provided verbal consent for this virtual visit at the beginning of the encounter.  Current Medications:  Current Outpatient Medications:    acetaminophen  (TYLENOL ) 500 MG tablet, Take 1,000 mg by mouth every 6 (six) hours as needed for moderate pain (pain score 4-6)., Disp: , Rfl:    amLODipine  (NORVASC ) 2.5 MG tablet, Take 1 tablet (2.5 mg total) by mouth daily. (Patient taking differently: Take 2.5 mg by mouth at bedtime.), Disp: 90 tablet, Rfl: 3   amoxicillin  (AMOXIL ) 500 MG capsule, TAKE 4 CAPSULES (2,000 MG TOTAL) BY MOUTH ONCE FOR 1 DOSE. PATIENT TO TAKE 4 TABLETS PRIOR TO DENTAL WORK., Disp: 4 capsule, Rfl: 10   aspirin  EC 81 MG tablet, Take 1 tablet (81 mg total) by mouth daily. Swallow whole., Disp: 30 tablet, Rfl: 11   atorvastatin  (LIPITOR) 10 MG tablet, TAKE 1 TABLET BY MOUTH EVERY DAY IN THE EVENING, Disp: 30 tablet, Rfl: 11   cetirizine  (ZYRTEC ) 10 MG tablet, TAKE 1 TABLET BY MOUTH EVERY DAY, Disp: 90 tablet, Rfl: 1   ELDERBERRY PO, Take 50 mg by mouth daily., Disp: , Rfl:    escitalopram  (LEXAPRO ) 20 MG tablet, TAKE 1 TABLET BY MOUTH EVERY DAY (Patient taking differently: Take 20 mg by mouth at bedtime.), Disp: 90 tablet, Rfl: 1   fluticasone  (FLONASE ) 50 MCG/ACT nasal spray, Place 2 sprays into both nostrils daily., Disp: 16 g, Rfl: 6   furosemide  (LASIX ) 20 MG tablet, Take 1 tablet (20 mg total) by mouth daily. (Patient  taking differently: Take 20 mg by mouth daily as needed for edema.), Disp: 90 tablet, Rfl: 3   gabapentin (NEURONTIN) 100 MG capsule, Take 100 mg by mouth 3 (three) times daily., Disp: , Rfl:    iron  polysaccharides (NIFEREX) 150 MG capsule, Take 1 tablet by mouth daily as needed (low iron )., Disp: , Rfl:    loperamide  (IMODIUM ) 2 MG capsule, Take 1 capsule (2 mg total) by mouth as needed for diarrhea or loose stools. (Patient taking differently: Take 4 mg by mouth as needed for diarrhea or loose stools.), Disp: 30 capsule, Rfl: 0   Magnesium  250 MG CAPS, Take 250 mg by mouth daily as needed (feet burning)., Disp: , Rfl:    metoprolol  tartrate (LOPRESSOR ) 25 MG tablet, Take 0.5 tablets (12.5 mg total) by mouth 2 (two) times daily., Disp: 90 tablet, Rfl: 3   Multiple Vitamin (MULTIVITAMIN WITH MINERALS) TABS tablet, Take 2 tablets by mouth in the morning., Disp: , Rfl:    naproxen  (EC NAPROSYN ) 500 MG EC tablet, Take 1 tablet (500 mg total) by mouth 2 (two) times daily with a meal., Disp: 60 tablet, Rfl: 2   ondansetron  (ZOFRAN ) 4 MG tablet, Take 4 mg by mouth every 8 (eight) hours as needed for vomiting or nausea., Disp: , Rfl:    oxyCODONE -acetaminophen  (PERCOCET) 5-325 MG tablet, Take 1 tablet by mouth every 4 (four) hours as needed for severe pain (pain score 7-10)., Disp: 20 tablet,  Rfl: 0   pantoprazole  (PROTONIX ) 40 MG tablet, TAKE 1 TABLET BY MOUTH EVERY DAY, Disp: 90 tablet, Rfl: 3   Potassium 99 MG TABS, Take 198 mg by mouth daily as needed (when taking lasix )., Disp: , Rfl:    terconazole  (TERAZOL 7 ) 0.4 % vaginal cream, Remove applicator tip and apply a small amount to the tip of a clean finger then apply to affected area daily, Disp: 45 g, Rfl: 0   tiZANidine  (ZANAFLEX ) 4 MG tablet, Take 4 mg by mouth at bedtime., Disp: , Rfl:    vitamin C  (ASCORBIC ACID ) 250 MG tablet, Take 500 mg by mouth daily., Disp: , Rfl:    warfarin (COUMADIN ) 5 MG tablet, TAKE 1/2 TO 1 TABLET DAILY OR AS DIRECTED  BY COUMADIN  CLINIC, Disp: 90 tablet, Rfl: 1   Medications ordered in this encounter:  No orders of the defined types were placed in this encounter.    *If you need refills on other medications prior to your next appointment, please contact your pharmacy*  Follow-Up: Call back or seek an in-person evaluation if the symptoms worsen or if the condition fails to improve as anticipated.  [facility] [phone]  Other Instructions Oral Thrush, Adult Oral thrush is an infection in your mouth and throat and on your tongue. It causes white patches to form in your mouth and on your tongue. Many cases of thrush are mild. But, sometimes, thrush can be serious. People who have a weak body defense system (immune system) or other diseases can be affected more. What are the causes? This condition is caused by a type of fungus called yeast. The fungus is normally present in small amounts in the mouth and nose. If a person has a long-term illness or a weak body defense system, the fungus can grow and spread quickly. This causes thrush. What increases the risk? You are more likely to develop this condition if: You have a weak body defense system. You are an older adult. You have diabetes, cancer, or HIV. You have a dry mouth. You are pregnant or breastfeeding. You do not take good care of your teeth. This risk is greater for people who have false teeth (dentures). You use antibiotic or steroid medicines. What are the signs or symptoms? Symptoms of this condition include: A burning feeling in the mouth and throat. White patches that stick to the mouth and tongue. A bad taste in the mouth or trouble tasting foods. A feeling like you have cotton in your mouth. Pain when you eat and swallow. Not wanting to eat as much as usual. Cracking at the corners of the mouth. How is this treated? This condition is treated with medicines called antifungals. These medicines prevent a fungus from  growing. The medicines are either put right on the area (topical) or swallowed (oral). Your doctor will also treat other problems that you may have, such as diabetes or HIV. Follow these instructions at home: Helping with pain and soreness To lessen your pain: Drink cold liquids, like water  and iced tea. Eat frozen ice pops or frozen juices. Eat foods that are easy to swallow, like gelatin and ice cream. Drink from a straw if you have too much pain in your mouth.  General instructions Take or use over-the-counter and prescription medicines only as told by your doctor. Eat plain yogurt that has live cultures in it. Read the label to make sure that there are live cultures in your yogurt. If you wear false  teeth: Take them out before you go to bed. Brush them well. Soak them in a cleaner. Rinse your mouth with warm salt-water  many times a day. To make the salt-water  mixture, dissolve -1 teaspoon (3-6 g) of salt in 1 cup (237 mL) of warm water . Contact a doctor if: Your problems do not get better within 7 days of treatment. Your infection is spreading. This may show as white areas on the skin outside of your mouth. You are nursing your baby and you have redness and pain in the nipples. Summary Oral thrush is an infection in your mouth and throat. It is caused by a fungus. You are more likely to get this condition if you have a weak body defense system. Diseases like diabetes, cancer, or HIV also add to your risk. This condition is treated with medicines called antifungals. Contact a doctor if you do not get better within 7 days of starting treatment. This information is not intended to replace advice given to you by your health care provider. Make sure you discuss any questions you have with your health care provider. Document Revised: 09/04/2022 Document Reviewed: 09/04/2022 Elsevier Patient Education  2024 Elsevier Inc.   If you have been instructed to have an in-person evaluation  today at a local Urgent Care facility, please use the link below. It will take you to a list of all of our available [facility], including address, phone number and hours of operation. Please do not delay care.  [facility]  If you or a family member do not have a primary care provider, use the link below to schedule a visit and establish care. When you choose a [facility] primary care physician or advanced practice provider, you gain a long-term partner in health. Find a Primary Care Provider  Learn more about [facility]'s in-office and virtual care options: [facility] - Get Care Now

## 2024-04-09 NOTE — Progress Notes (Signed)
Please see anticoagulation encounter.

## 2024-04-09 NOTE — Progress Notes (Signed)
 Virtual Visit Consent   VITALIA STOUGH, you are scheduled for a virtual visit with a [facility] provider today. Just as with appointments in the office, your consent must be obtained to participate. Your consent will be active for this visit and any virtual visit you may have with one of our providers in the next 365 days. If you have a MyChart account, a copy of this consent can be sent to you electronically.  As this is a virtual visit, video technology does not allow for your provider to perform a traditional examination. This may limit your provider's ability to fully assess your condition. If your provider identifies any concerns that need to be evaluated in person or the need to arrange testing (such as labs, EKG, etc.), we will make arrangements to do so. Although advances in technology are sophisticated, we cannot ensure that it will always work on either your end or our end. If the connection with a video visit is poor, the visit may have to be switched to a telephone visit. With either a video or telephone visit, we are not always able to ensure that we have a secure connection.  By engaging in this virtual visit, you consent to the provision of healthcare and authorize for your insurance to be billed (if applicable) for the services provided during this visit. Depending on your insurance coverage, you may receive a charge related to this service.  I need to obtain your verbal consent now. Are you willing to proceed with your visit today? Mary Chambers has provided verbal consent on 04/09/2024 for a virtual visit (video or telephone). Mary Chambers, [place]  Date: 04/09/2024 12:14 PM   Virtual Visit via Video Note   I, Mary Chambers, connected with  Mary Chambers  ([id number], 1971-08-26) on 04/09/24 at 12:00 PM EDT by a video-enabled telemedicine application and verified that I am speaking with the correct person using two identifiers.  Location: Patient: Virtual  Visit Location Patient: Home Provider: Virtual Visit Location Provider: Home Office   I discussed the limitations of evaluation and management by telemedicine and the availability of in person appointments. The patient expressed understanding and agreed to proceed.    History of Present Illness: Mary Chambers is a 53 y.o. who identifies as a female who was assigned female at birth, and is being seen today for possible thrush. Notes symptoms starting this morning -- with irritated throat sore, sore tongue and white patches at the back of her tongue. Notes she just finished round of prednisone  (12 days). Denies fevers, chills.     HPI: HPI  Problems:  Patient Active Problem List   Diagnosis Date Noted   History of hernia surgery 03/06/2024   Sprain of ulnar collateral ligament of metacarpophalangeal (MCP) joint of thumb 12/09/2023   Impaired ambulation 11/30/2023   Hypomagnesemia 11/30/2023   Hyperreflexia 07/02/2023   Iron  deficiency anemia 03/29/2023   Vitamin D  deficiency 03/29/2023   Pyelonephritis 03/23/2023   Acute pyelonephritis 03/22/2023   E coli bacteremia 03/22/2023   Wellness examination 07/27/2022   Lumbar pseudoarthrosis 12/22/2021   Idiopathic scoliosis and kyphoscoliosis 12/22/2021   Prediabetes 12/08/2021   Long term (current) use of anticoagulants 11/17/2021   S/P AVR (aortic valve replacement) 11/07/2021   Severe aortic regurgitation 07/27/2021   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   Septic arthritis (HCC) 06/23/2021   Gastroesophageal reflux disease 12/15/2020   Screening for colorectal cancer 12/15/2020   Nausea and vomiting 12/15/2020   History  of gastric ulcer 12/15/2020   Thoracic aortic aneurysm (HCC) 05/12/2020   Lumbar spinal stenosis 10/01/2019   Nephrolithiasis 07/29/2019   Prepyloric ulcer 07/17/2018   Sleep apnea 06/25/2018   Pain in left foot 03/29/2018   Migraines 02/13/2018   Morbidly obese (HCC) 02/13/2018   Synovial cyst of lumbar  spine 10/15/2017   Osteoarthritis of knee 07/13/2017   Lumbar disc disease with radiculopathy 08/01/2016   Fatigue 05/04/2016   Headache 05/04/2016   Numbness of foot 05/04/2016   Lumbar stenosis with neurogenic claudication 05/07/2015   Bilateral leg edema 04/21/2015   Renal atrophy, left 01/23/2014   Right lower quadrant pain 12/18/2013   Hypertension, benign 06/03/2010   Urinary incontinence 06/03/2010   Anxiety and depression 10/08/2007    Allergies:  Allergies  Allergen Reactions   Sulfur Itching   Baclofen Hives   Dilaudid  [Hydromorphone ] Itching   Latex     When inside the body it causes irritation  Other Reaction(s): Not available  latex   Oxycodone -Acetaminophen  Hives   Semaglutide  Nausea And Vomiting   Septra [Sulfamethoxazole-Trimethoprim] Hives   Vibramycin [Doxycycline] Itching   Medications:  Current Outpatient Medications:    nystatin  (MYCOSTATIN ) 100000 UNIT/ML suspension, Take 5 mLs (500,000 Units total) by mouth 4 (four) times daily., Disp: 200 mL, Rfl: 0   acetaminophen  (TYLENOL ) 500 MG tablet, Take 1,000 mg by mouth every 6 (six) hours as needed for moderate pain (pain score 4-6)., Disp: , Rfl:    amLODipine  (NORVASC ) 2.5 MG tablet, Take 1 tablet (2.5 mg total) by mouth daily. (Patient taking differently: Take 2.5 mg by mouth at bedtime.), Disp: 90 tablet, Rfl: 3   amoxicillin  (AMOXIL ) 500 MG capsule, TAKE 4 CAPSULES (2,000 MG TOTAL) BY MOUTH ONCE FOR 1 DOSE. PATIENT TO TAKE 4 TABLETS PRIOR TO DENTAL WORK., Disp: 4 capsule, Rfl: 10   aspirin  EC 81 MG tablet, Take 1 tablet (81 mg total) by mouth daily. Swallow whole., Disp: 30 tablet, Rfl: 11   atorvastatin  (LIPITOR) 10 MG tablet, TAKE 1 TABLET BY MOUTH EVERY DAY IN THE EVENING, Disp: 30 tablet, Rfl: 11   cetirizine  (ZYRTEC ) 10 MG tablet, TAKE 1 TABLET BY MOUTH EVERY DAY, Disp: 90 tablet, Rfl: 1   ELDERBERRY PO, Take 50 mg by mouth daily., Disp: , Rfl:    escitalopram  (LEXAPRO ) 20 MG tablet, TAKE 1 TABLET  BY MOUTH EVERY DAY (Patient taking differently: Take 20 mg by mouth at bedtime.), Disp: 90 tablet, Rfl: 1   fluticasone  (FLONASE ) 50 MCG/ACT nasal spray, Place 2 sprays into both nostrils daily., Disp: 16 g, Rfl: 6   furosemide  (LASIX ) 20 MG tablet, Take 1 tablet (20 mg total) by mouth daily. (Patient taking differently: Take 20 mg by mouth daily as needed for edema.), Disp: 90 tablet, Rfl: 3   gabapentin (NEURONTIN) 100 MG capsule, Take 100 mg by mouth 3 (three) times daily., Disp: , Rfl:    iron  polysaccharides (NIFEREX) 150 MG capsule, Take 1 tablet by mouth daily as needed (low iron )., Disp: , Rfl:    loperamide  (IMODIUM ) 2 MG capsule, Take 1 capsule (2 mg total) by mouth as needed for diarrhea or loose stools. (Patient taking differently: Take 4 mg by mouth as needed for diarrhea or loose stools.), Disp: 30 capsule, Rfl: 0   Magnesium  250 MG CAPS, Take 250 mg by mouth daily as needed (feet burning)., Disp: , Rfl:    metoprolol  tartrate (LOPRESSOR ) 25 MG tablet, Take 0.5 tablets (12.5 mg total) by mouth 2 (two) times  daily., Disp: 90 tablet, Rfl: 3   Multiple Vitamin (MULTIVITAMIN WITH MINERALS) TABS tablet, Take 2 tablets by mouth in the morning., Disp: , Rfl:    pantoprazole  (PROTONIX ) 40 MG tablet, TAKE 1 TABLET BY MOUTH EVERY DAY, Disp: 90 tablet, Rfl: 3   Potassium 99 MG TABS, Take 198 mg by mouth daily as needed (when taking lasix )., Disp: , Rfl:    tiZANidine  (ZANAFLEX ) 4 MG tablet, Take 4 mg by mouth at bedtime., Disp: , Rfl:    vitamin C  (ASCORBIC ACID ) 250 MG tablet, Take 500 mg by mouth daily., Disp: , Rfl:    warfarin (COUMADIN ) 5 MG tablet, TAKE 1/2 TO 1 TABLET DAILY OR AS DIRECTED BY COUMADIN  CLINIC, Disp: 90 tablet, Rfl: 1  Observations/Objective: Patient is well-developed, well-nourished in no acute distress.  Resting comfortably at home.  Head is normocephalic, atraumatic.  No labored breathing. Speech is clear and coherent with logical content.  Patient is alert and  oriented at baseline.  White.off white patches on back of her tongue noted. Similar areas noted by patient at back of throat but hard to fully visualize on exam due to lighting and her location.  Assessment and Plan: 1. Oral thrush (Primary) - nystatin  (MYCOSTATIN ) 100000 UNIT/ML suspension; Take 5 mLs (500,000 Units total) by mouth 4 (four) times daily.  Dispense: 200 mL; Refill: 0  Supportive measures reviewed. Start Nystatin  suspension per orders. Follow-up if not resolving.   Follow Up Instructions: I discussed the assessment and treatment plan with the patient. The patient was provided an opportunity to ask questions and all were answered. The patient agreed with the plan and demonstrated an understanding of the instructions.  A copy of instructions were sent to the patient via MyChart unless otherwise noted below.   The patient was advised to call back or seek an in-person evaluation if the symptoms worsen or if the condition fails to improve as anticipated.    Mary Velma Lunger, PA-C

## 2024-04-09 NOTE — Patient Instructions (Signed)
 Description   HOLD today's dose and then continue taking warfarin 1 tablet daily, except 1/2 tablet every Monday. Keep the number of servings of green leafy vegetables consistent in your diet and the V8 green drink..  Recheck in 2 weeks Coumadin  Clinic [phone] call with medication changes and or bleeding.

## 2024-04-17 ENCOUNTER — Encounter (HOSPITAL_BASED_OUTPATIENT_CLINIC_OR_DEPARTMENT_OTHER): Payer: Self-pay | Admitting: Family Medicine

## 2024-04-17 ENCOUNTER — Other Ambulatory Visit (HOSPITAL_BASED_OUTPATIENT_CLINIC_OR_DEPARTMENT_OTHER): Payer: Self-pay | Admitting: Family

## 2024-04-17 ENCOUNTER — Encounter (HOSPITAL_BASED_OUTPATIENT_CLINIC_OR_DEPARTMENT_OTHER): Payer: Self-pay

## 2024-04-17 DIAGNOSIS — R002 Palpitations: Secondary | ICD-10-CM

## 2024-04-21 ENCOUNTER — Encounter: Admitting: Certified Nurse Midwife

## 2024-04-21 ENCOUNTER — Ambulatory Visit: Admitting: Family Medicine

## 2024-04-22 DIAGNOSIS — G4733 Obstructive sleep apnea (adult) (pediatric): Secondary | ICD-10-CM | POA: Diagnosis not present

## 2024-04-23 ENCOUNTER — Ambulatory Visit (INDEPENDENT_AMBULATORY_CARE_PROVIDER_SITE_OTHER): Admitting: Podiatry

## 2024-04-23 ENCOUNTER — Telehealth: Payer: Self-pay | Admitting: Lab

## 2024-04-23 ENCOUNTER — Ambulatory Visit: Attending: Cardiology

## 2024-04-23 ENCOUNTER — Encounter: Payer: Self-pay | Admitting: Certified Nurse Midwife

## 2024-04-23 ENCOUNTER — Ambulatory Visit: Payer: Self-pay | Admitting: Lab

## 2024-04-23 VITALS — Ht 67.0 in | Wt 271.0 lb

## 2024-04-23 DIAGNOSIS — Z952 Presence of prosthetic heart valve: Secondary | ICD-10-CM | POA: Diagnosis not present

## 2024-04-23 DIAGNOSIS — Z7901 Long term (current) use of anticoagulants: Secondary | ICD-10-CM | POA: Diagnosis not present

## 2024-04-23 DIAGNOSIS — I351 Nonrheumatic aortic (valve) insufficiency: Secondary | ICD-10-CM

## 2024-04-23 DIAGNOSIS — M722 Plantar fascial fibromatosis: Secondary | ICD-10-CM | POA: Diagnosis not present

## 2024-04-23 DIAGNOSIS — M7731 Calcaneal spur, right foot: Secondary | ICD-10-CM

## 2024-04-23 LAB — POCT INR: INR: 1.8 — AB (ref 2.0–3.0)

## 2024-04-23 NOTE — Telephone Encounter (Signed)
 We will need clearance request from surgeon please.

## 2024-04-23 NOTE — Progress Notes (Signed)
 Subjective:  Patient ID: Mary Chambers, female    DOB: [birth year],  MRN: [id number]  Chief Complaint  Patient presents with   Foot Pain    RM 3 Patient is here for MRI results. Patient states throbbing burning pain in right foot. Patient states wearing boot intermittently.    53 y.o. female presents with the above complaint. History confirmed with patient. She returns for follow-up her right foot is still about the same, painful even in the boot.  Objective:  Physical Exam: warm, good capillary refill, no trophic changes or ulcerative lesions, normal DP and PT pulses, normal sensory exam, and dystrophic mycotic second toenail bilateral.  Right Foot: point tenderness over the heel pad, tenderness at Achilles tendon insertion, and gastrocnemius equinus is noted with a positive silverskiold test  Radiographs taken of the right foot shows a plantar calcaneal spur  Narrative & Impression  MR ANKLE WITHOUT IV CONTRAST RIGHT   COMPARISON: Foot x-rays 03/26/2024   CLINICAL HISTORY: Chronic plantar fasciitis and heel spur.   PULSE SEQUENCES: Ax T1, Ax T2 FS, Sag T1, Sag T2 FS, Cor STIR, Ax T1 FS   FINDINGS:   Bones: Mild degenerative changes are seen. No accelerated arthrosis. No fracture or acute abnormality. There is a slightly prominent calcaneal heel spur. No joint effusion.   Ligaments: The anterior and posterior tibiofibular and talofibular ligaments are intact. Deltoid ligament and spring ligaments are intact. The sinus tarsi is unremarkable.   Musculotendinous structures: The tibialis anterior, extensor digitorum and extensor hallucis longus tendons are unremarkable. The posterior tibial tendon, flexor hallucis longus and flexor digitorum tendons are unremarkable. There is mild peroneal tenosynovitis without evidence of significant tendinosis or tendon tear. No subluxation of the tendons. The Achilles tendon is unremarkable. There is thickening of the medial plantar  fascia likely reflective of chronic plantar fasciitis. No reactive edema is identified. Mild edema seen in the plantar musculature. There is fatty atrophy of the muscles of the foot. Mild subcutaneous edema is present. No significant drainable collection.   IMPRESSION: Mild subcutaneous edema and mild degenerative changes.   Thickening of the plantar fascia with a calcaneal spur. Findings are likely reflective of chronic plantar fasciitis.   Tenosynovitis of peroneal tendons without tendon injury or subluxation.   Electronically signed by: Norleen Satchel MD 04/08/2024 11:04 AM EDT RP Workstation: [id number]     Assessment:   1. Plantar fasciitis   2. Heel spur, right      Plan:  Patient was evaluated and treated and all questions answered.  She returns for follow-up we reviewed the results of her MRI and her clinical findings.  Discussed the severe thickening of the plantar fascia there is some bony edema in the spur as well and I recommend resection of this as well.  Surgery we discussed endoscopic plantar fasciotomy, spur resection and gastrocnemius recession for her significant gastrocnemius equinus.  We discussed the risk benefits and potential complications including but not limited to pain, swelling, infection, scar, numbness which may be temporary or permanent, chronic pain, stiffness, nerve pain or damage, wound healing problems, bone healing problems including delayed or non-union.  We discussed the recovery process including a period of weightbearing in a tall cam boot for 6 weeks.  All questions addressed.  Informed consent signed and reviewed.  Anticoagulation will be coordinated with heart care she will need cardiology clearance as well for surgery.  Will plan to do this at the outpatient surgery center and if this is not possible then  will do as outpatient at hospital   Surgical plan:  Procedure: - Right foot EPF, heel spur resection, gastroc recession  Location: -  [facility]  Anesthesia plan: - Sedation with regional block  Postoperative pain plan: - Tylenol  1000 mg every 6 hours, gabapentin 300 mg every 8 hours x5 days, oxycodone  5 mg 1-2 tabs every 6 hours only as needed  DVT prophylaxis: - She may resume warfarin postop  WB Restrictions / DME needs: - Weightbearing in tall cam boot    No follow-ups on file.

## 2024-04-23 NOTE — Patient Instructions (Signed)
 Description   Continue taking warfarin 1 tablet daily, except 1/2 tablet every Monday. Keep the number of servings of green leafy vegetables consistent in your diet and the V8 green drink..  Recheck INR 1 week post procedure.  Coumadin  Clinic [phone] call with medication changes and or bleeding.

## 2024-04-23 NOTE — Progress Notes (Signed)
 INR 1.8  Please see anticoagulation encounter

## 2024-04-23 NOTE — Telephone Encounter (Signed)
 I called and left message for surgery scheduler to call back for clarification. I read the notes that were sent to the preop call back pool, though no real clear notes to follow.   In further review trying to locate who call the surgeon's office from our office it looks like possibly it was the anticoagulation clinic.   We will need to have a clearance form faxed to our office [phone] attn: preop team. We will be unable to proceed until the request has been received.

## 2024-04-23 NOTE — Telephone Encounter (Signed)
 [facility] Care is calling in regards to a cardiac clearance that needs to be done for patient scheduled next week and the patient is on blood thinners and if not discontinued in time patient will not be able to have surgery with us . Please fax to [phone] contact person number is [phone].

## 2024-04-24 ENCOUNTER — Telehealth: Payer: Self-pay

## 2024-04-24 ENCOUNTER — Encounter: Payer: Self-pay | Admitting: Podiatry

## 2024-04-24 ENCOUNTER — Telehealth: Payer: Self-pay | Admitting: Podiatry

## 2024-04-24 NOTE — Telephone Encounter (Signed)
   Pre-operative Risk Assessment    Patient Name: STACIE TEMPLIN  DOB: 04-17-1971 MRN: [id number]   Date of last office visit: 10/17/23 SHELDA BRUCKNER, MD Date of next office visit: 05/09/24 ([facility]IN  CLIN[facility]), 07/07/24 SUZY BRUCKNER, MD   Request for Surgical Clearance    Procedure:  FOOT SURGERY (ENDOSCOPIC PLANTAR FASTIOTOMY AND SPUR REMOVAL  Date of Surgery:  Clearance 05/02/24                                Surgeon:  NOT INDICATED  Surgeon's Group or Practice Name:  [facility] Phone number:  [phone] Fax number:  [phone]  ATTN: DAWN   Type of Clearance Requested:   - Medical  - Pharmacy:  Hold Aspirin  and Warfarin (Coumadin )     Type of Anesthesia:  IV SEDATION   Additional requests/questions:    Signed, Lucie DELENA Ku   04/24/2024, 1:20 PM

## 2024-04-24 NOTE — Telephone Encounter (Signed)
 Received Preop clearance request. Has been entered in the patients chart and been routed to the preop pool for the Preop APP to review.

## 2024-04-24 NOTE — Telephone Encounter (Signed)
 PER [employer] WEBSITE NO AUTH IS REQUIRED FOR CPT [id number] AND [id number]. DECISION # [id number].  DOS 05/02/24

## 2024-04-25 ENCOUNTER — Telehealth

## 2024-04-25 ENCOUNTER — Telehealth: Payer: Self-pay | Admitting: *Deleted

## 2024-04-25 NOTE — Telephone Encounter (Signed)
 Pharmacy please advise on holding warfarin prior to   FOOT SURGERY (ENDOSCOPIC PLANTAR FASTIOTOMY AND SPUR REMOVAL  scheduled for 05/02/2024. Thank you.

## 2024-04-25 NOTE — Telephone Encounter (Signed)
 Pt has been scheduled tele preop appt 04/28/24. Per preop APP ok to let the pt know she can begin holding her Warfarin 04/27/24. Med rec and consent are done.      Patient Consent for Virtual Visit        Mary Chambers has provided verbal consent on 04/25/2024 for a virtual visit (video or telephone).   CONSENT FOR VIRTUAL VISIT FOR:  Mary Chambers  By participating in this virtual visit I agree to the following:  I hereby voluntarily request, consent and authorize [facility] and its employed or contracted physicians, physician assistants, nurse practitioners or other licensed health care professionals (the Practitioner), to provide me with telemedicine health care services (the "Services) as deemed necessary by the treating Practitioner. I acknowledge and consent to receive the Services by the Practitioner via telemedicine. I understand that the telemedicine visit will involve communicating with the Practitioner through live audiovisual communication technology and the disclosure of certain medical information by electronic transmission. I acknowledge that I have been given the opportunity to request an in-person assessment or other available alternative prior to the telemedicine visit and am voluntarily participating in the telemedicine visit.  I understand that I have the right to withhold or withdraw my consent to the use of telemedicine in the course of my care at any time, without affecting my right to future care or treatment, and that the Practitioner or I may terminate the telemedicine visit at any time. I understand that I have the right to inspect all information obtained and/or recorded in the course of the telemedicine visit and may receive copies of available information for a reasonable fee.  I understand that some of the potential risks of receiving the Services via telemedicine include:  Delay or interruption in medical evaluation due to technological equipment  failure or disruption; Information transmitted may not be sufficient (e.g. poor resolution of images) to allow for appropriate medical decision making by the Practitioner; and/or  In rare instances, security protocols could fail, causing a breach of personal health information.  Furthermore, I acknowledge that it is my responsibility to provide information about my medical history, conditions and care that is complete and accurate to the best of my ability. I acknowledge that Practitioner's advice, recommendations, and/or decision may be based on factors not within their control, such as incomplete or inaccurate data provided by me or distortions of diagnostic images or specimens that may result from electronic transmissions. I understand that the practice of medicine is not an exact science and that Practitioner makes no warranties or guarantees regarding treatment outcomes. I acknowledge that a copy of this consent can be made available to me via my patient portal [facility] MyChart), or I can request a printed copy by calling the office of [facility].    I understand that my insurance will be billed for this visit.   I have read or had this consent read to me. I understand the contents of this consent, which adequately explains the benefits and risks of the Services being provided via telemedicine.  I have been provided ample opportunity to ask questions regarding this consent and the Services and have had my questions answered to my satisfaction. I give my informed consent for the services to be provided through the use of telemedicine in my medical care

## 2024-04-25 NOTE — Telephone Encounter (Signed)
 Pt called requesting Warfarin dosing instructions for upcoming procedure on 05/02/24. Will forward to PharmD team to advise.

## 2024-04-25 NOTE — Telephone Encounter (Signed)
 Pt has been scheduled tele preop appt 04/28/24. Per preop APP ok to let the pt know she can begin holding her Warfarin 04/27/24. Med rec and consent are done.

## 2024-04-25 NOTE — Telephone Encounter (Signed)
 Called and spoke with pt. Made her aware she will need to hold Warfarin 5 days prior to procedure (start hold on 04/27/24) and resume Warfarin on 05/02/24 post procedure or as directed by surgeon. Pt verbalized understanding. Follow-up coumadin  clinic appt post procedure on 05/09/24.

## 2024-04-25 NOTE — Telephone Encounter (Signed)
   Name: Mary Chambers  DOB: 08-Jan-1971  MRN: [id number]  Primary Cardiologist: Shelda Bruckner, MD   Preoperative team, please contact this patient and set up a phone call appointment for further preoperative risk assessment. Please obtain consent and complete medication review. Thank you for your help.  I confirm that guidance regarding antiplatelet and oral anticoagulation therapy has been completed and, if necessary, noted below.  Per office protocol, patient can hold warfarin for 5 days prior to procedure.   Patient will not need bridging with Lovenox  (enoxaparin ) around procedure.  I also confirmed the patient resides in the state of [place] . As per [facility] Medical Board telemedicine laws, the patient must reside in the state in which the provider is licensed.   Lamarr Satterfield, NP 04/25/2024, 2:32 PM [facility]

## 2024-04-25 NOTE — Telephone Encounter (Signed)
 Per pt the surgeon is DR. MCDONALD

## 2024-04-25 NOTE — Telephone Encounter (Signed)
 Patient with diagnosis of mechanical aortic valve (On-X) on warfarin for anticoagulation.    Procedure:  FOOT SURGERY (ENDOSCOPIC PLANTAR FASTIOTOMY AND SPUR REMOVAL   Date of Surgery:  Clearance 05/02/24       CrCl >100 Platelet count 293  Per office protocol, patient can hold warfarin for 5 days prior to procedure.   Patient will not need bridging with Lovenox  (enoxaparin ) around procedure.  **This guidance is not considered finalized until pre-operative APP has relayed final recommendations.**

## 2024-04-28 ENCOUNTER — Telehealth: Payer: Self-pay | Admitting: Podiatry

## 2024-04-28 ENCOUNTER — Ambulatory Visit: Attending: Internal Medicine | Admitting: Student

## 2024-04-28 DIAGNOSIS — Z01810 Encounter for preprocedural cardiovascular examination: Secondary | ICD-10-CM

## 2024-04-28 NOTE — Progress Notes (Signed)
 Virtual Visit via Telephone Note   Because of Mary Chambers's co-morbid illnesses, she is at least at moderate risk for complications without adequate follow up.  This format is felt to be most appropriate for this patient at this time.  The patient did not have access to video technology/had technical difficulties with video requiring transitioning to audio format only (telephone).  All issues noted in this document were discussed and addressed.  No physical exam could be performed with this format.  Please refer to the patient's chart for her consent to telehealth for [facility].  Evaluation Performed:  Preoperative cardiovascular risk assessment _____________   Date:  04/28/2024   Patient ID:  Mary Chambers, DOB [birth year], MRN [id number] Patient Location:  Home Provider location:   Office  Primary Care Provider:  de [place], Quintin PARAS, MD Primary Cardiologist:  Shelda Bruckner, MD  Chief Complaint / Patient Profile   53 y.o. 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  History of Present Illness    Mary Chambers is a 53 y.o. female who presents via audio/video conferencing for a telehealth visit today.  Pt was last seen in cardiology clinic on 10/17/2023 by Dr. Bruckner.  At that time Mary Chambers was stable from a cardiac standpoint.  The patient is now pending procedure as outlined above. Since her last visit, she is doing well. Patient denies shortness of breath, dyspnea on exertion, orthopnea or PND. She reports chronic lower extremity edema that is not present in the morning and progresses throughout the day. No chest  pain, pressure, or tightness. No palpitations.  She denies lightheadedness or dizziness. She stays active by taking short walks in her yard or around the store. She performs light to moderate household activities.   Past Medical History    Past Medical History:  Diagnosis Date   Abscess 06/23/2021   Achilles tendonitis 01/16/2014   Overview:   Followed by [employer].  Treated with brace and diclofenac .   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  Follow-up with Dr. Zena.  Watch for opportunities to increase physical activity within neuromuscular capabilities.   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)    Past Surgical History:  Procedure Laterality Date   ABDOMINAL HYSTERECTOMY     AORTIC VALVE REPLACEMENT N/A 11/07/2021   Procedure: AORTIC VALVE REPLACEMENT USING A On-X AORTIC  VALVE.;  Surgeon: Shyrl Linnie KIDD, MD;  Location: [facility] OR;  Service: Open Heart Surgery;  Laterality: N/A;   BACK SURGERY     BUBBLE STUDY  08/31/2021   Procedure: BUBBLE STUDY;  Surgeon: Hobart Powell BRAVO, MD;  Location: [facility] ENDOSCOPY;  Service: Cardiovascular;;   CARPAL TUNNEL RELEASE Bilateral    CHOLECYSTECTOMY      INGUINAL HERNIA REPAIR Left 02/29/2024   Procedure: REPAIR, HERNIA, INGUINAL, LAPAROSCOPIC;  Surgeon: Lyndel Deward PARAS, MD;  Location: [facility] ORS;  Service: General;  Laterality: Left;  LATERALITY: LEFT   IR FLUORO GUIDED NEEDLE PLC ASPIRATION/INJECTION LOC  06/23/2021   LITHOTRIPSY     neck fusion     RIGHT/LEFT HEART CATH AND CORONARY ANGIOGRAPHY N/A 10/07/2021   Procedure: RIGHT/LEFT HEART CATH AND CORONARY ANGIOGRAPHY;  Surgeon: Dann Candyce RAMAN, MD;  Location: [facility] INVASIVE CV LAB;  Service: Cardiovascular;  Laterality: N/A;   TEE WITHOUT CARDIOVERSION N/A 06/28/2021   Procedure: TRANSESOPHAGEAL ECHOCARDIOGRAM (TEE);  Surgeon: Pietro Redell RAMAN, MD;  Location: [facility] ENDOSCOPY;  Service: Cardiovascular;  Laterality: N/A;   TEE WITHOUT CARDIOVERSION N/A 08/31/2021   Procedure: TRANSESOPHAGEAL ECHOCARDIOGRAM (TEE);  Surgeon: Hobart Powell BRAVO, MD;  Location: [facility] ENDOSCOPY;  Service: Cardiovascular;  Laterality: N/A;   TEE WITHOUT CARDIOVERSION N/A 11/07/2021   Procedure: TRANSESOPHAGEAL ECHOCARDIOGRAM (TEE);  Surgeon: Shyrl Linnie KIDD, MD;  Location: [facility] OR;  Service: Open Heart Surgery;  Laterality: N/A;   TUBAL LIGATION     UMBILICAL HERNIA REPAIR N/A 02/29/2024   Procedure: REPAIR, HERNIA, UMBILICAL, ADULT;  Surgeon: Lyndel Deward PARAS, MD;  Location: [facility] ORS;  Service: General;  Laterality: N/A;    Allergies  Allergies  Allergen Reactions   Sulfur Itching   Baclofen Hives   Dilaudid  [Hydromorphone ] Itching   Latex     When inside the body it causes irritation  Other Reaction(s): Not available  latex   Oxycodone -Acetaminophen  Hives   Semaglutide  Nausea And Vomiting   Septra [Sulfamethoxazole-Trimethoprim] Hives   Vibramycin [Doxycycline] Itching    Home Medications    Prior to Admission medications   Medication Sig Start Date End Date Taking? Authorizing Provider  acetaminophen  (TYLENOL ) 500 MG tablet Take 1,000 mg by mouth every 6 (six) hours as needed for  moderate pain (pain score 4-6).    [provider]  amLODipine  (NORVASC ) 2.5 MG tablet TAKE 1 TABLET BY MOUTH EVERY DAY 04/17/24   Walker, Caitlin S, NP  amoxicillin  (AMOXIL ) 500 MG capsule TAKE 4 CAPSULES (2,000 MG TOTAL) BY MOUTH ONCE FOR 1 DOSE. PATIENT TO TAKE 4 TABLETS PRIOR TO DENTAL WORK. 04/08/24   Lonni Slain, MD  aspirin  EC 81 MG tablet Take 1 tablet (81 mg total) by mouth daily. Swallow whole. 08/15/22   Lonni Slain, MD  atorvastatin  (LIPITOR) 10 MG tablet TAKE 1 TABLET BY MOUTH EVERY DAY IN THE EVENING 07/26/23   Lonni Slain, MD  ELDERBERRY PO Take 50 mg by mouth daily.    [provider]  escitalopram  (LEXAPRO ) 20 MG tablet TAKE 1 TABLET BY MOUTH EVERY DAY Patient taking differently: Take 20 mg by mouth at bedtime. 01/14/24   de [place], Raymond J, MD  fluticasone  (FLONASE ) 50 MCG/ACT nasal spray Place 2 sprays into both nostrils daily. 11/30/23   de [place], Quintin PARAS, MD  furosemide  (LASIX ) 20 MG tablet Take 1 tablet (20 mg total) by mouth daily. Patient taking differently: Take 20 mg by mouth daily as needed for edema. 08/15/22   Lonni Slain, MD  gabapentin (NEURONTIN) 100 MG  capsule Take 100 mg by mouth 3 (three) times daily.    [provider]  iron  polysaccharides (NIFEREX) 150 MG capsule Take 1 tablet by mouth daily as needed (low iron ).    [provider]  loperamide  (IMODIUM ) 2 MG capsule Take 1 capsule (2 mg total) by mouth as needed for diarrhea or loose stools. Patient taking differently: Take 4 mg by mouth as needed for diarrhea or loose stools. 07/01/21   Samtani, Jai-Gurmukh, MD  Magnesium  250 MG CAPS Take 250 mg by mouth daily as needed (feet burning).    [provider]  metoprolol  tartrate (LOPRESSOR ) 25 MG tablet Take 0.5 tablets (12.5 mg total) by mouth 2 (two) times daily. 08/06/23   Lonni Slain, MD  Multiple Vitamin (MULTIVITAMIN WITH MINERALS) TABS tablet Take 2 tablets by mouth  in the morning.    [provider]  naproxen  (NAPROSYN ) 250 MG tablet Take 250 mg by mouth 2 (two) times daily. 04/03/24   [provider]  pantoprazole  (PROTONIX ) 40 MG tablet TAKE 1 TABLET BY MOUTH EVERY DAY 03/20/24   de [place], Quintin PARAS, MD  Potassium 99 MG TABS Take 198 mg by mouth daily as needed (when taking lasix ).    [provider]  predniSONE  (STERAPRED UNI-PAK 48 TAB) 10 MG (48) TBPK tablet SMARTSIG:1 Tablet(s) By Mouth 03/25/24   [provider]  tiZANidine  (ZANAFLEX ) 4 MG tablet Take 4 mg by mouth at bedtime.    [provider]  vitamin C  (ASCORBIC ACID ) 250 MG tablet Take 500 mg by mouth daily.    [provider]  warfarin (COUMADIN ) 5 MG tablet TAKE 1/2 TO 1 TABLET DAILY OR AS DIRECTED BY COUMADIN  CLINIC 03/19/24   Lonni Slain, MD    Physical Exam    Vital Signs:  Mary Chambers does not have vital signs available for review today.  Given telephonic nature of communication, physical exam is limited. AAOx3. NAD. Normal affect.  Speech and respirations are unlabored.   Assessment & Plan    Primary Cardiologist: Slain Lonni, MD  Preoperative cardiovascular risk assessment.  Endoscopic plantar fasciotomy and spur removal by [facility] on 05/02/2024.  Chart reviewed as part of pre-operative protocol coverage. According to the RCRI, patient has a 0.5% risk of MACE. Patient reports activity equivalent to 4.0 METS (short walks in yard, performs light to moderate household activities).   Given past medical history and time since last visit, based on ACC/AHA guidelines, Mary Chambers would be at acceptable risk for the planned procedure without further cardiovascular testing.   Patient was advised that if she develops new symptoms prior to surgery to contact our office to arrange a follow-up appointment.  she verbalized understanding.  Per Pharm D, patient may hold Coumadin  for 5 days  prior to procedure.  Patient will not need bridging with Lovenox  around procedure.   Ideally aspirin  should be continued without interruption, however if the bleeding risk is too great, aspirin  may be held for 5-7 days prior to surgery. Please resume aspirin  post operatively when it is felt to be safe from a bleeding standpoint.    I will route this recommendation to the requesting party via Epic fax function.  Please call with questions.  Time:   Today, I have spent 5 minutes with the patient with telehealth technology discussing medical history, symptoms, and management plan.     Barnie Hila, NP  04/28/2024, 8:46 AM

## 2024-04-28 NOTE — Progress Notes (Deleted)
 Ellouise Console, PA-C [address] [place], [place]  [address] Phone: [phone]   Primary Care Physician: de [place], Quintin PARAS, MD  Primary Gastroenterologist:  Ellouise Console, PA-C / Elspeth Naval, MD   Chief Complaint: Rectal bleeding       HPI:   Mary Chambers is a 53 y.o. female, established patient Dr. Naval, presents to evaluate rectal bleeding.  01/2021 last colonoscopy: 2 small (3 mm) adenomatous polyps removed.  Mild sigmoid diverticulosis.  Internal hemorrhoids.  Good prep.  7-year repeat (due 01/2028).  01/2021 last EGD: 2 scars in the gastric antrum likely from history of PUD.  Erythematous mucosa in the antrum.  Normal esophagus, no evidence of Barrett's or esophagitis.  Normal duodenum.  Biopsy showed chronic gastritis, and negative for H. pylori.  03/06/2024 lab: Mild anemia with Hgb 11.2, MCV 85.  PMH: Aortic valve replacement, currently on Coumadin .  Hypertension, thoracic aortic aneurysm, sleep apnea, GERD, PUD, arthritis, obesity.  Current Outpatient Medications  Medication Sig Dispense Refill   acetaminophen  (TYLENOL ) 500 MG tablet Take 1,000 mg by mouth every 6 (six) hours as needed for moderate pain (pain score 4-6).     amLODipine  (NORVASC ) 2.5 MG tablet TAKE 1 TABLET BY MOUTH EVERY DAY 90 tablet 0   amoxicillin  (AMOXIL ) 500 MG capsule TAKE 4 CAPSULES (2,000 MG TOTAL) BY MOUTH ONCE FOR 1 DOSE. PATIENT TO TAKE 4 TABLETS PRIOR TO DENTAL WORK. 4 capsule 10   aspirin  EC 81 MG tablet Take 1 tablet (81 mg total) by mouth daily. Swallow whole. 30 tablet 11   atorvastatin  (LIPITOR) 10 MG tablet TAKE 1 TABLET BY MOUTH EVERY DAY IN THE EVENING 30 tablet 11   ELDERBERRY PO Take 50 mg by mouth daily.     escitalopram  (LEXAPRO ) 20 MG tablet TAKE 1 TABLET BY MOUTH EVERY DAY (Patient taking differently: Take 20 mg by mouth at bedtime.) 90 tablet 1   fluticasone  (FLONASE ) 50 MCG/ACT nasal spray Place 2 sprays into both nostrils daily. 16 g 6   furosemide   (LASIX ) 20 MG tablet Take 1 tablet (20 mg total) by mouth daily. (Patient taking differently: Take 20 mg by mouth daily as needed for edema.) 90 tablet 3   gabapentin (NEURONTIN) 100 MG capsule Take 100 mg by mouth 3 (three) times daily.     iron  polysaccharides (NIFEREX) 150 MG capsule Take 1 tablet by mouth daily as needed (low iron ).     loperamide  (IMODIUM ) 2 MG capsule Take 1 capsule (2 mg total) by mouth as needed for diarrhea or loose stools. (Patient taking differently: Take 4 mg by mouth as needed for diarrhea or loose stools.) 30 capsule 0   Magnesium  250 MG CAPS Take 250 mg by mouth daily as needed (feet burning).     metoprolol  tartrate (LOPRESSOR ) 25 MG tablet Take 0.5 tablets (12.5 mg total) by mouth 2 (two) times daily. 90 tablet 3   Multiple Vitamin (MULTIVITAMIN WITH MINERALS) TABS tablet Take 2 tablets by mouth in the morning.     naproxen  (NAPROSYN ) 250 MG tablet Take 250 mg by mouth 2 (two) times daily.     pantoprazole  (PROTONIX ) 40 MG tablet TAKE 1 TABLET BY MOUTH EVERY DAY 90 tablet 3   Potassium 99 MG TABS Take 198 mg by mouth daily as needed (when taking lasix ).     predniSONE  (STERAPRED UNI-PAK 48 TAB) 10 MG (48) TBPK tablet SMARTSIG:1 Tablet(s) By Mouth     tiZANidine  (ZANAFLEX ) 4 MG tablet  Take 4 mg by mouth at bedtime.     vitamin C  (ASCORBIC ACID ) 250 MG tablet Take 500 mg by mouth daily.     warfarin (COUMADIN ) 5 MG tablet TAKE 1/2 TO 1 TABLET DAILY OR AS DIRECTED BY COUMADIN  CLINIC 90 tablet 1   No current facility-administered medications for this visit.    Allergies as of 04/29/2024 - Review Complete 04/28/2024  Allergen Reaction Noted   Sulfur Itching 11/30/2023   Baclofen Hives 09/20/2016   Dilaudid  [hydromorphone ] Itching 10/24/2021   Latex  02/20/2024   Oxycodone -acetaminophen  Hives 02/07/2024   Semaglutide  Nausea And Vomiting 10/17/2023   Septra [sulfamethoxazole-trimethoprim] Hives 04/17/2015   Vibramycin [doxycycline] Itching 09/29/2015    Past  Medical History:  Diagnosis Date   Abscess 06/23/2021   Achilles tendonitis 01/16/2014   Overview:   Followed by [employer].  Treated with brace and diclofenac .   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  Follow-up with Dr. Zena.  Watch for opportunities to increase physical activity within neuromuscular capabilities.   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   Transaminitis 06/23/2021   Urinary incontinence 06/03/2010   Vertebral osteomyelitis (HCC)     Past Surgical History:  Procedure Laterality Date   ABDOMINAL HYSTERECTOMY     AORTIC VALVE REPLACEMENT N/A 11/07/2021   Procedure: AORTIC VALVE REPLACEMENT USING A On-X AORTIC VALVE.;  Surgeon: Shyrl Linnie KIDD, MD;  Location: [facility] OR;  Service: Open Heart Surgery;  Laterality: N/A;   BACK SURGERY     BUBBLE STUDY  08/31/2021   Procedure: BUBBLE STUDY;  Surgeon: Hobart Powell BRAVO, MD;  Location: [facility] ENDOSCOPY;  Service: Cardiovascular;;   CARPAL TUNNEL RELEASE Bilateral    CHOLECYSTECTOMY     INGUINAL HERNIA REPAIR Left 02/29/2024   Procedure: REPAIR, HERNIA, INGUINAL, LAPAROSCOPIC;  Surgeon: Lyndel Deward PARAS, MD;  Location: [facility] ORS;  Service: General;  Laterality: Left;  LATERALITY: LEFT   IR FLUORO GUIDED NEEDLE PLC  ASPIRATION/INJECTION LOC  06/23/2021   LITHOTRIPSY     neck fusion     RIGHT/LEFT HEART CATH AND CORONARY ANGIOGRAPHY N/A 10/07/2021   Procedure: RIGHT/LEFT HEART CATH AND CORONARY ANGIOGRAPHY;  Surgeon: Dann Candyce RAMAN, MD;  Location: [facility] INVASIVE CV LAB;  Service: Cardiovascular;  Laterality: N/A;   TEE WITHOUT CARDIOVERSION N/A 06/28/2021   Procedure: TRANSESOPHAGEAL ECHOCARDIOGRAM (TEE);  Surgeon: Pietro Redell RAMAN, MD;  Location: [facility] ENDOSCOPY;  Service: Cardiovascular;  Laterality: N/A;   TEE WITHOUT CARDIOVERSION N/A 08/31/2021   Procedure: TRANSESOPHAGEAL ECHOCARDIOGRAM (TEE);  Surgeon: Hobart Powell BRAVO, MD;  Location: [facility] ENDOSCOPY;  Service: Cardiovascular;  Laterality: N/A;   TEE WITHOUT CARDIOVERSION N/A 11/07/2021   Procedure: TRANSESOPHAGEAL ECHOCARDIOGRAM (TEE);  Surgeon: Shyrl Linnie KIDD, MD;  Location: [facility] OR;  Service: Open Heart Surgery;  Laterality: N/A;   TUBAL LIGATION     UMBILICAL HERNIA REPAIR N/A 02/29/2024   Procedure: REPAIR, HERNIA, UMBILICAL, ADULT;  Surgeon: Lyndel Deward PARAS, MD;  Location: [facility] ORS;  Service: General;  Laterality: N/A;    Review of Systems:    All systems reviewed and negative except where noted in HPI.    Physical Exam:  There were no vitals taken for this visit. No LMP recorded. Patient has had a hysterectomy.  General: Well-nourished, well-developed in no acute distress.  Lungs: Clear to auscultation bilaterally. Non-labored. Heart: Regular rate and rhythm, no murmurs rubs or gallops.  Abdomen: Bowel sounds are normal; Abdomen is Soft; No hepatosplenomegaly, masses or hernias;  No Abdominal Tenderness; No guarding or rebound tenderness. Neuro: Alert and oriented x 3.  Grossly intact.  Psych: Alert and cooperative, normal mood and affect.   Imaging Studies: MR HEEL RIGHT WO CONTRAST Result Date: 04/08/2024 MR ANKLE WITHOUT IV CONTRAST RIGHT COMPARISON: Foot x-rays 03/26/2024 CLINICAL HISTORY: Chronic plantar fasciitis and  heel spur. PULSE SEQUENCES: Ax T1, Ax T2 FS, Sag T1, Sag T2 FS, Cor STIR, Ax T1 FS FINDINGS: Bones: Mild degenerative changes are seen. No accelerated arthrosis. No fracture or acute abnormality. There is a slightly prominent calcaneal heel spur. No joint effusion. Ligaments: The anterior and posterior tibiofibular and talofibular ligaments are intact. Deltoid ligament and spring ligaments are intact. The sinus tarsi is unremarkable. Musculotendinous structures: The tibialis anterior, extensor digitorum and extensor hallucis longus tendons are unremarkable. The posterior tibial tendon, flexor hallucis longus and flexor digitorum tendons are unremarkable. There is mild peroneal tenosynovitis without evidence of significant tendinosis or tendon tear. No subluxation of the tendons. The Achilles tendon is unremarkable. There is thickening of the medial plantar fascia likely reflective of chronic plantar fasciitis. No reactive edema is identified. Mild edema seen in the plantar musculature. There is fatty atrophy of the muscles of the foot. Mild subcutaneous edema is present. No significant drainable collection. IMPRESSION: Mild subcutaneous edema and mild degenerative changes. Thickening of the plantar fascia with a calcaneal spur. Findings are likely reflective of chronic plantar fasciitis. Tenosynovitis of peroneal tendons without tendon injury or subluxation. Electronically signed by: Norleen Satchel MD 04/08/2024 11:04 AM EDT RP Workstation: [id number]    Labs: CBC    Component Value Date/Time   WBC 8.4 03/06/2024 1927   RBC 4.38 03/06/2024 1927   HGB 11.2 (L) 03/06/2024 1927   HGB 14.1 08/22/2023 1028   HCT 37.3 03/06/2024 1927   HCT 45.5 08/22/2023 1028   PLT 293 03/06/2024 1927   PLT 302 08/22/2023 1028   MCV 85.2 03/06/2024 1927   MCV 86 08/22/2023 1028   MCV 87 09/28/2014 1405   MCH 25.6 (L) 03/06/2024 1927   MCHC 30.0 03/06/2024 1927   RDW 15.4 03/06/2024 1927   RDW 15.7 (H) 08/22/2023 1028    RDW 13.5 09/28/2014 1405   LYMPHSABS 0.5 (L) 02/29/2024 2055   LYMPHSABS 1.9 08/22/2023 1028   LYMPHSABS 2.7 09/28/2014 1405   MONOABS 0.1 02/29/2024 2055   MONOABS 0.7 09/28/2014 1405   EOSABS 0.0 02/29/2024 2055   EOSABS 0.3 08/22/2023 1028   EOSABS 0.3 09/28/2014 1405   BASOSABS 0.0 02/29/2024 2055   BASOSABS 0.1 08/22/2023 1028   BASOSABS 0.1 09/28/2014 1405    CMP     Component Value Date/Time   NA 140 03/06/2024 1927   NA 146 (H) 08/22/2023 1028   NA 139 09/28/2014 1405   K  4.2 03/06/2024 1927   K 3.9 09/28/2014 1405   CL 106 03/06/2024 1927   CL 108 (H) 09/28/2014 1405   CO2 25 03/06/2024 1927   CO2 24 09/28/2014 1405   GLUCOSE 99 03/06/2024 1927   GLUCOSE 96 09/28/2014 1405   BUN 19 03/06/2024 1927   BUN 11 08/22/2023 1028   BUN 15 09/28/2014 1405   CREATININE 0.89 03/06/2024 1927   CREATININE 0.86 09/28/2014 1405   CALCIUM  8.6 (L) 03/06/2024 1927   CALCIUM  8.5 09/28/2014 1405   PROT 6.9 03/06/2024 1927   PROT 6.3 08/22/2023 1028   PROT 7.2 12/18/2013 1429   ALBUMIN  3.5 03/06/2024 1927   ALBUMIN  3.8 08/22/2023 1028   ALBUMIN  3.3 (L) 12/18/2013 1429   AST 22 03/06/2024 1927   AST 19 12/18/2013 1429   ALT 14 03/06/2024 1927   ALT 20 12/18/2013 1429   ALKPHOS 72 03/06/2024 1927   ALKPHOS 63 12/18/2013 1429   BILITOT 0.9 03/06/2024 1927   BILITOT 0.4 08/22/2023 1028   BILITOT 0.6 12/18/2013 1429   GFRNONAA >60 03/06/2024 1927   GFRNONAA >60 09/28/2014 1405   GFRNONAA >60 12/18/2013 1429   GFRAA >60 04/22/2020 1207   GFRAA >60 09/28/2014 1405   GFRAA >60 12/18/2013 1429       Assessment and Plan:   MANMEET ARZOLA is a 53 y.o. y/o female ***    Ellouise Console, PA-C  Follow up ***

## 2024-04-28 NOTE — Telephone Encounter (Signed)
 Left message for pt that Dr Silva would like pt to hold her Asprin as of today until after surgery. If she has taken it today that is ok but to discontinue for rest of week until after her procedure.

## 2024-04-29 ENCOUNTER — Ambulatory Visit: Admitting: Physician Assistant

## 2024-05-02 ENCOUNTER — Other Ambulatory Visit: Payer: Self-pay | Admitting: Podiatry

## 2024-05-02 DIAGNOSIS — M7731 Calcaneal spur, right foot: Secondary | ICD-10-CM | POA: Diagnosis not present

## 2024-05-02 DIAGNOSIS — M722 Plantar fascial fibromatosis: Secondary | ICD-10-CM | POA: Diagnosis not present

## 2024-05-02 DIAGNOSIS — G4739 Other sleep apnea: Secondary | ICD-10-CM | POA: Diagnosis not present

## 2024-05-02 DIAGNOSIS — G4733 Obstructive sleep apnea (adult) (pediatric): Secondary | ICD-10-CM | POA: Diagnosis not present

## 2024-05-02 DIAGNOSIS — M25774 Osteophyte, right foot: Secondary | ICD-10-CM | POA: Diagnosis not present

## 2024-05-02 DIAGNOSIS — M216X1 Other acquired deformities of right foot: Secondary | ICD-10-CM | POA: Diagnosis not present

## 2024-05-02 DIAGNOSIS — M5116 Intervertebral disc disorders with radiculopathy, lumbar region: Secondary | ICD-10-CM | POA: Diagnosis not present

## 2024-05-02 DIAGNOSIS — G8918 Other acute postprocedural pain: Secondary | ICD-10-CM | POA: Diagnosis not present

## 2024-05-02 DIAGNOSIS — M272 Inflammatory conditions of jaws: Secondary | ICD-10-CM | POA: Diagnosis not present

## 2024-05-02 MED ORDER — HYDROCODONE-ACETAMINOPHEN 5-325 MG PO TABS: ORAL_TABLET | ORAL | 0 refills | Status: DC

## 2024-05-07 ENCOUNTER — Ambulatory Visit (INDEPENDENT_AMBULATORY_CARE_PROVIDER_SITE_OTHER): Admitting: Podiatry

## 2024-05-07 VITALS — Ht 67.0 in | Wt 271.0 lb

## 2024-05-07 DIAGNOSIS — G4733 Obstructive sleep apnea (adult) (pediatric): Secondary | ICD-10-CM | POA: Diagnosis not present

## 2024-05-07 DIAGNOSIS — M7731 Calcaneal spur, right foot: Secondary | ICD-10-CM

## 2024-05-07 DIAGNOSIS — M722 Plantar fascial fibromatosis: Secondary | ICD-10-CM

## 2024-05-07 DIAGNOSIS — M62461 Contracture of muscle, right lower leg: Secondary | ICD-10-CM

## 2024-05-07 MED ORDER — CEPHALEXIN 500 MG PO CAPS: 500.0000 mg | ORAL_CAPSULE | Freq: Three times a day (TID) | ORAL | 0 refills | Status: DC

## 2024-05-07 NOTE — Progress Notes (Signed)
  Subjective:  Patient ID: Mary Chambers, female    DOB: 22-Oct-1970,  MRN: [id number]  Chief Complaint  Patient presents with   Post-op Follow-up    Rm 1 POV # 1 DOS 05/02/24 RT FOOT CALF MUSCLE LENGTHENING,PLANTAR FACIAL RELEASE AND SPUR REMOVAL. Patient states intense discomfort and pain in the right heel, no pressure can be applied to the heel. Sutures are intact with minimum swelling.     53 y.o. female returns for post-op check.   Review of Systems: Negative except as noted in the HPI. Denies N/V/F/Ch.   Objective:  There were no vitals filed for this visit. Body mass index is 42.44 kg/m. Constitutional Well developed. Well nourished.  Vascular Foot warm and well perfused. Capillary refill normal to all digits.  Calf is soft and supple, no posterior calf or knee pain, negative Homans' sign  Neurologic Normal speech. Oriented to person, place, and time. Epicritic sensation to light touch grossly present bilaterally.  Dermatologic Skin healing well and well coapted, there is slight periwound erythema on the heel spur resection site  Orthopedic: Tenderness to palpation noted about the surgical site.   Assessment:   1. Plantar fasciitis of right foot   2. Gastrocnemius equinus of right lower extremity   3. Heel spur, right    Plan:  Patient was evaluated and treated and all questions answered.  S/p foot surgery right -Progressing as expected post-operatively. -WB Status: WBAT in CAM boot -Sutures: Removed in 2 weeks. -Medications: Rx Keflex  sent to pharmacy, most erythema appear to be reactive but recommended treating to prevent infection -Foot redressed.  Okay to shower. -PT referral placed for [facility] PT, she will call to schedule  No follow-ups on file.

## 2024-05-08 ENCOUNTER — Telehealth: Payer: Self-pay | Admitting: Podiatry

## 2024-05-08 NOTE — Telephone Encounter (Signed)
 Patient contacted answering service with concern for bleeding from surgical site, advised she change bandage if possible or bolster with clean gauze and compression.   Will have patient added to [facility] schedule in the AM for dressing change.

## 2024-05-09 ENCOUNTER — Encounter: Payer: Self-pay | Admitting: Podiatry

## 2024-05-09 ENCOUNTER — Ambulatory Visit: Attending: Cardiology

## 2024-05-09 ENCOUNTER — Telehealth: Payer: Self-pay | Admitting: Podiatry

## 2024-05-09 DIAGNOSIS — Z952 Presence of prosthetic heart valve: Secondary | ICD-10-CM | POA: Diagnosis not present

## 2024-05-09 DIAGNOSIS — Z7901 Long term (current) use of anticoagulants: Secondary | ICD-10-CM

## 2024-05-09 DIAGNOSIS — I351 Nonrheumatic aortic (valve) insufficiency: Secondary | ICD-10-CM | POA: Diagnosis not present

## 2024-05-09 LAB — POCT INR: INR: 1.2 — AB (ref 2.0–3.0)

## 2024-05-09 NOTE — Progress Notes (Signed)
 INR 1.2; Please see anticoagulation encounter

## 2024-05-09 NOTE — Telephone Encounter (Signed)
 Patient called in but at this point there aren't slots open. She mentioned oozing yellow color liquid ,she is in pain and she doesn't have clean gauze. What do you suggest ?

## 2024-05-09 NOTE — Patient Instructions (Signed)
 Description   Take 1.5 tablets today and then continue taking warfarin 1 tablet daily, except 1/2 tablet every Monday. Keep the number of servings of green leafy vegetables consistent in your diet and the V8 green drink..  Recheck INR 2 weeks.  Coumadin  Clinic [phone] call with medication changes and or bleeding.

## 2024-05-12 ENCOUNTER — Telehealth: Payer: Self-pay | Admitting: Podiatry

## 2024-05-12 NOTE — Telephone Encounter (Signed)
 Called patient to confirm the providers suggestions. No answer and couldn't leave a voicemail.

## 2024-05-12 NOTE — Telephone Encounter (Signed)
 Provider has already addressed concerns have left detailed message.

## 2024-05-13 ENCOUNTER — Ambulatory Visit (HOSPITAL_BASED_OUTPATIENT_CLINIC_OR_DEPARTMENT_OTHER): Admitting: Cardiology

## 2024-05-14 ENCOUNTER — Ambulatory Visit (INDEPENDENT_AMBULATORY_CARE_PROVIDER_SITE_OTHER): Admitting: Podiatry

## 2024-05-14 ENCOUNTER — Encounter: Payer: Self-pay | Admitting: Podiatry

## 2024-05-14 VITALS — Ht 67.0 in | Wt 271.0 lb

## 2024-05-14 DIAGNOSIS — M62461 Contracture of muscle, right lower leg: Secondary | ICD-10-CM

## 2024-05-14 DIAGNOSIS — M722 Plantar fascial fibromatosis: Secondary | ICD-10-CM

## 2024-05-14 MED ORDER — CEPHALEXIN 500 MG PO CAPS: 500.0000 mg | ORAL_CAPSULE | Freq: Three times a day (TID) | ORAL | 0 refills | Status: DC

## 2024-05-14 NOTE — Progress Notes (Signed)
  Subjective:  Patient ID: Mary Chambers, female    DOB: [birth year],  MRN: [id number]  Chief Complaint  Patient presents with   Post-op Follow-up    Rm 2 patient is here for a post-op follow-up. Patient states some moderate pain when standing and walking. Surgical site is healing well, sutures are intact and some slight drainage from sutures on exterior side of the right foot.     53 y.o. female returns for post-op check.   Review of Systems: Negative except as noted in the HPI. Denies N/V/F/Ch.   Objective:  There were no vitals filed for this visit. Body mass index is 42.44 kg/m. Constitutional Well developed. Well nourished.  Vascular Foot warm and well perfused. Capillary refill normal to all digits.  Calf is soft and supple, no posterior calf or knee pain, negative Homans' sign  Neurologic Normal speech. Oriented to person, place, and time. Epicritic sensation to light touch grossly present bilaterally.  Dermatologic Endoscopy incisions are well-healed without signs of infection the medial heel spur resection site is still erythematous but improved since the last visit.  Mild serous drainage.  No purulence ascending cellulitis or malodor.  Orthopedic: There is no pain to palpation noted about the surgical site.   Assessment:   1. Gastrocnemius equinus of right lower extremity   2. Plantar fasciitis of right foot    Plan:  Patient was evaluated and treated and all questions answered.  S/p foot surgery right - Return in 2 weeks for suture removal the medial heel incisions will not be ready next week.  Her erythema is improved but I will extend her cephalexin  for 1 more week.  Advised to change daily with Betadine to the incision site.  Weight-bear as tolerated in cam boot.  Start PT after next visit.  Return in about 2 weeks (around 05/28/2024) for post op (new x-rays).

## 2024-05-18 ENCOUNTER — Other Ambulatory Visit (HOSPITAL_BASED_OUTPATIENT_CLINIC_OR_DEPARTMENT_OTHER): Payer: Self-pay | Admitting: Family Medicine

## 2024-05-21 ENCOUNTER — Telehealth: Payer: Self-pay

## 2024-05-21 ENCOUNTER — Encounter

## 2024-05-21 ENCOUNTER — Encounter: Admitting: Podiatry

## 2024-05-21 DIAGNOSIS — M5416 Radiculopathy, lumbar region: Secondary | ICD-10-CM | POA: Diagnosis not present

## 2024-05-21 NOTE — Telephone Encounter (Signed)
   Pre-operative Risk Assessment    Patient Name: Mary Chambers  DOB: 11/17/1970 MRN: [id number]   Date of last office visit: 10/17/23 SHELDA BRUCKNER, MD Date of next office visit: 07/07/24 SHELDA BRUCKNER, MD   Request for Surgical Clearance    Procedure:  L4-5 ESI - R  Date of Surgery:  Clearance TBD                                Surgeon:  DR DEATRICE MANUS Surgeon's Group or Practice Name:  [facility] Phone number:  [phone] Fax number:  [phone]   Type of Clearance Requested:   - Medical  - Pharmacy:  Hold Warfarin (Coumadin ) 5 DAYS PRIOR AND RESUME NEXT DAY & ASPIRIN    Type of Anesthesia:  Not Indicated   Additional requests/questions:    Signed, Lucie DELENA Ku   05/21/2024, 9:55 AM

## 2024-05-21 NOTE — Telephone Encounter (Signed)
   Patient Name: Mary Chambers  DOB: 22-Nov-1970 MRN: [id number]  Primary Cardiologist: Shelda Bruckner, MD  Preoperative cardiovascular risk assessment.  Endoscopic plantar fasciotomy and spur removal by [facility] on 05/02/2024.   Chart reviewed as part of pre-operative protocol coverage. According to the RCRI, patient has a 0.5% risk of MACE. Patient reports activity equivalent to 4.0 METS (short walks in yard, performs light to moderate household activities).    Given past medical history and time since last visit, based on ACC/AHA guidelines, TACIE MCCUISTION would be at acceptable risk for the planned procedure without further cardiovascular testing.    Patient was advised that if she develops new symptoms prior to surgery to contact our office to arrange a follow-up appointment.  she verbalized understanding.   Per Pharm D, patient may hold Coumadin  for 5 days prior to procedure.  Patient will not need bridging with Lovenox  around procedure.   Ideally aspirin  should be continued without interruption, however if the bleeding risk is too great, aspirin  may be held for 5-7 days prior to surgery. Please resume aspirin  post operatively when it is felt to be safe from a bleeding standpoint.    The patient was advised that if she develops new symptoms prior to surgery to contact our office to arrange for a follow-up visit, and she verbalized understanding.  I will route this recommendation to the requesting party via Epic fax function and remove from pre-op pool.  Please call with questions.  Lamarr Satterfield, NP 05/21/2024, 10:48 AM

## 2024-05-28 ENCOUNTER — Ambulatory Visit (INDEPENDENT_AMBULATORY_CARE_PROVIDER_SITE_OTHER): Admitting: Podiatry

## 2024-05-28 ENCOUNTER — Ambulatory Visit: Attending: Cardiology

## 2024-05-28 VITALS — BP 146/60 | HR 79 | Temp 98.2°F | Ht 67.0 in | Wt 271.0 lb

## 2024-05-28 DIAGNOSIS — Z9889 Other specified postprocedural states: Secondary | ICD-10-CM

## 2024-05-28 DIAGNOSIS — Z7901 Long term (current) use of anticoagulants: Secondary | ICD-10-CM

## 2024-05-28 DIAGNOSIS — I351 Nonrheumatic aortic (valve) insufficiency: Secondary | ICD-10-CM

## 2024-05-28 DIAGNOSIS — Z952 Presence of prosthetic heart valve: Secondary | ICD-10-CM

## 2024-05-28 DIAGNOSIS — M722 Plantar fascial fibromatosis: Secondary | ICD-10-CM

## 2024-05-28 DIAGNOSIS — M62461 Contracture of muscle, right lower leg: Secondary | ICD-10-CM

## 2024-05-28 LAB — POCT INR: INR: 1.4 — AB (ref 2.0–3.0)

## 2024-05-28 MED ORDER — GENTAMICIN SULFATE 0.1 % EX OINT
1.0000 | TOPICAL_OINTMENT | Freq: Every day | CUTANEOUS | 0 refills | Status: AC
Start: 2024-05-28 — End: ?

## 2024-05-28 NOTE — Patient Instructions (Signed)
 Take 1.5 tablets today only then continue taking warfarin 1 tablet daily, except 1/2 tablet every Monday. Keep the number of servings of green leafy vegetables consistent in your diet and the V8 green drink..  Recheck INR 4 weeks.  Coumadin  Clinic [phone] call with medication changes and or bleeding.

## 2024-06-01 NOTE — Progress Notes (Signed)
  Subjective:  Patient ID: Mary Chambers, female    DOB: 09/18/1971,  MRN: [id number]  Chief Complaint  Patient presents with   Post-op Follow-up    RM 2 POV # 2 DOS 05/02/24 RT FOOT CALF MUSCLE LENGTHENING,PLANTAR FACIAL RELEASE AND SPUR REMOVAL. Having drainage at surgical site. Patient states minimum pain. Sutures are intact with no signs of infection.     53 y.o. female returns for post-op check. Doing well   Review of Systems: Negative except as noted in the HPI. Denies N/V/F/Ch.   Objective:  There were no vitals filed for this visit. Body mass index is 42.44 kg/m. Constitutional Well developed. Well nourished.  Vascular Foot warm and well perfused. Capillary refill normal to all digits.  Calf is soft and supple, no posterior calf or knee pain, negative Homans' sign  Neurologic Normal speech. Oriented to person, place, and time. Epicritic sensation to light touch grossly present bilaterally.  Dermatologic All incisions are now well healed w/o s/o infection. Some fibrotic materal at suture holes  Orthopedic: There is no pain to palpation noted about the surgical site.   Assessment:   No diagnosis found.  Plan:  Patient was evaluated and treated and all questions answered.  S/p foot surgery right Sutures removed. WBAT in CAM boot until next viist. Start PT for ROM and strengthening. Rx gent ointment sent to apply to suture site. No further need for PO abx  No follow-ups on file.

## 2024-06-02 ENCOUNTER — Telehealth: Admitting: Physician Assistant

## 2024-06-02 DIAGNOSIS — R3989 Other symptoms and signs involving the genitourinary system: Secondary | ICD-10-CM | POA: Diagnosis not present

## 2024-06-02 DIAGNOSIS — G4739 Other sleep apnea: Secondary | ICD-10-CM | POA: Diagnosis not present

## 2024-06-02 DIAGNOSIS — M5116 Intervertebral disc disorders with radiculopathy, lumbar region: Secondary | ICD-10-CM | POA: Diagnosis not present

## 2024-06-02 DIAGNOSIS — G4733 Obstructive sleep apnea (adult) (pediatric): Secondary | ICD-10-CM | POA: Diagnosis not present

## 2024-06-02 MED ORDER — NITROFURANTOIN MONOHYD MACRO 100 MG PO CAPS: 100.0000 mg | ORAL_CAPSULE | Freq: Two times a day (BID) | ORAL | 0 refills | Status: DC

## 2024-06-02 NOTE — Progress Notes (Signed)
 E-Visit for Urinary Problems  We are sorry that you are not feeling well.  Here is how we plan to help!  Based on what you shared with me it looks like you most likely have a simple urinary tract infection.  A UTI (Urinary Tract Infection) is a bacterial infection of the bladder.  Most cases of urinary tract infections are simple to treat but a key part of your care is to encourage you to drink plenty of fluids and watch your symptoms carefully.  I have prescribed MacroBid 100 mg twice a day for 5 days.  Your symptoms should gradually improve. Call us if the burning in your urine worsens, you develop worsening fever, back pain or pelvic pain or if your symptoms do not resolve after completing the antibiotic.  Urinary tract infections can be prevented by drinking plenty of water to keep your body hydrated.  Also be sure when you wipe, wipe from front to back and don't hold it in!  If possible, empty your bladder every 4 hours.  HOME CARE Drink plenty of fluids Compete the full course of the antibiotics even if the symptoms resolve Remember, when you need to go.go. Holding in your urine can increase the likelihood of getting a UTI! GET HELP RIGHT AWAY IF: You cannot urinate You get a high fever Worsening back pain occurs You see blood in your urine You feel sick to your stomach or throw up You feel like you are going to pass out  MAKE SURE YOU  Understand these instructions. Will watch your condition. Will get help right away if you are not doing well or get worse.   Thank you for choosing an e-visit.  Your e-visit answers were reviewed by a board certified advanced clinical practitioner to complete your personal care plan. Depending upon the condition, your plan could have included both over the counter or prescription medications.  Please review your pharmacy choice. Make sure the pharmacy is open so you can pick up prescription now. If there is a problem, you may contact your  provider through [employer] and have the prescription routed to another pharmacy.  Your safety is important to [place]. If you have drug allergies check your prescription carefully.   For the next 24 hours you can use MyChart to ask questions about today's visit, request a non-urgent call back, or ask for a work or school excuse. You will get an email in the next two days asking about your experience. I hope that your e-visit has been valuable and will speed your recovery.  I have spent 5 minutes in review of e-visit questionnaire, review and updating patient chart, medical decision making and response to patient.   Margaretann Loveless, PA-C

## 2024-06-03 ENCOUNTER — Ambulatory Visit: Attending: Podiatry

## 2024-06-03 DIAGNOSIS — M25571 Pain in right ankle and joints of right foot: Secondary | ICD-10-CM | POA: Diagnosis not present

## 2024-06-03 DIAGNOSIS — M6281 Muscle weakness (generalized): Secondary | ICD-10-CM | POA: Diagnosis not present

## 2024-06-03 DIAGNOSIS — M62461 Contracture of muscle, right lower leg: Secondary | ICD-10-CM | POA: Diagnosis not present

## 2024-06-03 DIAGNOSIS — M722 Plantar fascial fibromatosis: Secondary | ICD-10-CM | POA: Insufficient documentation

## 2024-06-03 DIAGNOSIS — R262 Difficulty in walking, not elsewhere classified: Secondary | ICD-10-CM | POA: Diagnosis not present

## 2024-06-03 NOTE — Therapy (Signed)
 OUTPATIENT PHYSICAL THERAPY EVALUATION   Patient Name: Mary Chambers MRN: [id number] DOB:December 06, 1970, 53 y.o., female Today's Date: 06/03/2024  END OF SESSION:  PT End of Session - 06/03/24 0958     Visit Number 1    Number of Visits 17    Date for PT Re-Evaluation 08/01/24    PT Start Time 0958   Pt arrived late   PT Stop Time 1040    PT Time Calculation (min) 42 min    Activity Tolerance Patient tolerated treatment well    Behavior During Therapy [facility] for tasks assessed/performed          Past Medical History:  Diagnosis Date   Abscess 06/23/2021   Achilles tendonitis 01/16/2014   Overview:   Followed by [employer].  Treated with brace and diclofenac .   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  Follow-up with Dr. Zena.  Watch for opportunities to increase physical activity within neuromuscular capabilities.   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   Transaminitis 06/23/2021   Urinary incontinence 06/03/2010   Vertebral osteomyelitis (HCC)    Past Surgical History:  Procedure Laterality Date   ABDOMINAL HYSTERECTOMY     AORTIC VALVE REPLACEMENT N/A 11/07/2021   Procedure: AORTIC VALVE REPLACEMENT USING A On-X AORTIC VALVE.;  Surgeon: Shyrl Linnie KIDD, MD;   Location: [facility] OR;  Service: Open Heart Surgery;  Laterality: N/A;   BACK SURGERY     BUBBLE STUDY  08/31/2021   Procedure: BUBBLE STUDY;  Surgeon: Hobart Powell BRAVO, MD;  Location: [facility] ENDOSCOPY;  Service: Cardiovascular;;   CARPAL TUNNEL RELEASE Bilateral    CHOLECYSTECTOMY     INGUINAL HERNIA REPAIR Left 02/29/2024   Procedure: REPAIR, HERNIA, INGUINAL, LAPAROSCOPIC;  Surgeon: Lyndel Deward PARAS, MD;  Location: [facility] ORS;  Service: General;  Laterality: Left;  LATERALITY: LEFT   IR FLUORO GUIDED NEEDLE PLC ASPIRATION/INJECTION LOC  06/23/2021   LITHOTRIPSY     neck fusion     RIGHT/LEFT HEART CATH AND CORONARY ANGIOGRAPHY N/A 10/07/2021   Procedure: RIGHT/LEFT HEART CATH AND CORONARY ANGIOGRAPHY;  Surgeon: Dann Candyce RAMAN, MD;  Location: [facility] INVASIVE CV LAB;  Service: Cardiovascular;  Laterality: N/A;   TEE WITHOUT CARDIOVERSION N/A 06/28/2021   Procedure: TRANSESOPHAGEAL ECHOCARDIOGRAM (TEE);  Surgeon: Pietro Redell RAMAN, MD;  Location: [facility] ENDOSCOPY;  Service: Cardiovascular;  Laterality: N/A;   TEE WITHOUT CARDIOVERSION N/A 08/31/2021   Procedure: TRANSESOPHAGEAL ECHOCARDIOGRAM (TEE);  Surgeon: Hobart Powell BRAVO, MD;  Location: [facility] ENDOSCOPY;  Service: Cardiovascular;  Laterality: N/A;   TEE WITHOUT CARDIOVERSION N/A 11/07/2021   Procedure: TRANSESOPHAGEAL ECHOCARDIOGRAM (TEE);  Surgeon: Shyrl Linnie KIDD, MD;  Location: [facility] OR;  Service: Open Heart Surgery;  Laterality: N/A;   TUBAL LIGATION  UMBILICAL HERNIA REPAIR N/A 02/29/2024   Procedure: REPAIR, HERNIA, UMBILICAL, ADULT;  Surgeon: Lyndel Deward PARAS, MD;  Location: [facility] ORS;  Service: General;  Laterality: N/A;   Patient Active Problem List   Diagnosis Date Noted   History of hernia surgery 03/06/2024   Sprain of ulnar collateral ligament of metacarpophalangeal (MCP) joint of thumb 12/09/2023   Impaired ambulation 11/30/2023   Hypomagnesemia 11/30/2023   Hyperreflexia 07/02/2023   Iron  deficiency anemia 03/29/2023    Vitamin D  deficiency 03/29/2023   Pyelonephritis 03/23/2023   Acute pyelonephritis 03/22/2023   E coli bacteremia 03/22/2023   Wellness examination 07/27/2022   Lumbar pseudoarthrosis 12/22/2021   Idiopathic scoliosis and kyphoscoliosis 12/22/2021   Prediabetes 12/08/2021   Long term (current) use of anticoagulants 11/17/2021   S/P AVR (aortic valve replacement) 11/07/2021   Severe aortic regurgitation 07/27/2021   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   Septic arthritis (HCC) 06/23/2021   Gastroesophageal reflux disease 12/15/2020   Screening for colorectal cancer 12/15/2020   Nausea and vomiting 12/15/2020   History of gastric ulcer 12/15/2020   Thoracic aortic aneurysm (HCC) 05/12/2020   Lumbar spinal stenosis 10/01/2019   Nephrolithiasis 07/29/2019   Prepyloric ulcer 07/17/2018   Sleep apnea 06/25/2018   Pain in left foot 03/29/2018   Migraines 02/13/2018   Morbidly obese (HCC) 02/13/2018   Synovial cyst of lumbar spine 10/15/2017   Osteoarthritis of knee 07/13/2017   Lumbar disc disease with radiculopathy 08/01/2016   Fatigue 05/04/2016   Headache 05/04/2016   Numbness of foot 05/04/2016   Lumbar stenosis with neurogenic claudication 05/07/2015   Bilateral leg edema 04/21/2015   Renal atrophy, left 01/23/2014   Right lower quadrant pain 12/18/2013   Hypertension, benign 06/03/2010   Urinary incontinence 06/03/2010   Anxiety and depression 10/08/2007    PCP: de [place], Raymond J, MD   REFERRING PROVIDER:    Silva Juliene SAUNDERS, DPM    REFERRING DIAG: [phone] (ICD-10-CM) - Gastrocnemius equinus of right lower extremity M72.2 (ICD-10-CM) - Plantar fasciitis of right foot  THERAPY DIAG:  Pain in right ankle and joints of right foot - Plan: PT plan of care cert/re-cert  Difficulty in walking, not elsewhere classified - Plan: PT plan of care cert/re-cert  Muscle weakness (generalized) - Plan: PT plan of care cert/re-cert  Rationale for Evaluation and Treatment:  Rehabilitation  ONSET DATE:  DOS 05/02/2024  SUBJECTIVE:   SUBJECTIVE STATEMENT: See subjective.  PERTINENT HISTORY: S/P plantar fascia and gastroc release on 05/02/24. Pt had a heel spur and real bad plantar facitis. Pt calf muscle was also lengthened due to tight calf muscle. Better able to DF her R foot after her surgery. PLOF: ambulation with SPC. Currently use a wc and walks with rw in the house and to get to and from her car. R heel has started hurting a little bit recently. Walks with rw in her house without her boot and her foot and heel are fine. Has to put antibiotic cream at her R inferior medial heel, surgeon aware.  Boot removal on 06/11/2024 at her doctor's appointment.      No latex band allergies per pt.  Blood pressure is controlled per pt.   PAIN:  Are you having pain? Yes: NPRS scale: 0/10 currently (R CAM boot on, pt sitting on wc) Pain location: R inferior medial heel Pain description: achy Aggravating factors:   Relieving factors: elevated, cold compress.   PRECAUTIONS: WBAT with boot  RED FLAGS: Bowel or bladder incontinence: No and Cauda  equina syndrome: No   WEIGHT BEARING RESTRICTIONS: WBAT with CAM boot on R  FALLS:  Has patient fallen in last 6 months? No  LIVING ENVIRONMENT: Lives with: lives with their son and female roommate Lives in: House/apartment Stairs: Yes: External: 3 steps; none Has following equipment at home: Single point cane, Walker - 2 wheeled, Wheelchair (manual), shower chair, Grab bars, and raised toilet seat  OCCUPATION: disabled since her heart valve surgery February 2023.   PLOF: MOD I with SPC  PATIENT GOALS: Get her strength back to be able to walk without her rw.   NEXT MD VISIT: 06/11/2024  OBJECTIVE:  Note: Objective measures were completed at Evaluation unless otherwise noted.  DIAGNOSTIC FINDINGS:   PATIENT SURVEYS:  LEFS  Extreme difficulty/unable (0), Quite a bit of difficulty (1), Moderate difficulty (2),  Little difficulty (3), No difficulty (4) Survey date:  06/03/2024  Any of your usual work, housework or school activities 1  2. Usual hobbies, recreational or sporting activities 1  3. Getting into/out of the bath 3  4. Walking between rooms 3  5. Putting on socks/shoes 3  6. Squatting  0  7. Lifting an object, like a bag of groceries from the floor 0  8. Performing light activities around your home 2  9. Performing heavy activities around your home 0  10. Getting into/out of a car 3  11. Walking 2 blocks 0  12. Walking 1 mile 0  13. Going up/down 10 stairs (1 flight) 2  14. Standing for 1 hour 0  15.  sitting for 1 hour 3  16. Running on even ground 0  17. Running on uneven ground 0  18. Making sharp turns while running fast 0  19. Hopping  0  20. Rolling over in bed 3  Score total:  24/80     COGNITION: Overall cognitive status: Within functional limits for tasks assessed     SENSATION:   EDEMA:  Swelling R leg, ankle and foot   MUSCLE LENGTH:  POSTURE:   PALPATION: TTP R posterior medial heel  Swelling R leg, ankle, foot  LOWER EXTREMITY ROM:  Active ROM Right eval Left eval  Hip flexion    Hip extension    Hip abduction    Hip adduction    Hip internal rotation    Hip external rotation    Knee flexion    Knee extension    Ankle dorsiflexion -8 (5 degrees AAROM)   Ankle plantarflexion 53   Ankle inversion 16   Ankle eversion 20    (Blank rows = not tested)  LOWER EXTREMITY MMT:  MMT Right eval Left eval  Hip flexion 4 4-  Hip extension (seated manually resisted) 4- 3+  Hip abduction (seated manually resisted) 4- 4-  Hip adduction    Hip internal rotation    Hip external rotation    Knee flexion 3+ 4  Knee extension 4- 4  Ankle dorsiflexion (supine manually resisted) 4   Ankle plantarflexion (supine manually resisted) 3+   Ankle inversion (supine manually resisted) 3+   Ankle eversion (supine manually resisted) 4-    (Blank rows = not  tested)  LOWER EXTREMITY SPECIAL TESTS:    FUNCTIONAL TESTS:    GAIT: Distance walked: 60 ft Assistive device utilized: Walker - 2 wheeled Level of assistance: CGA Comments: with CAM boot on. Altalgic, decreased stance R LE with L pelvic drop during R LE stance phase.  TREATMENT DATE: 06/03/2024   Therapeutic exercise  Supine ankle DF/PF 30x  Supine toe curls 10x5 seconds for 2 sets  Supine toe extension 10x5 seconds for 2 sets  Reviewed HEP. Pt demonstrated and verbalized understanding. Handout provided.   Improved exercise technique, movement at target joints, use of target muscles after mod verbal, visual, tactile cues.     PATIENT EDUCATION:  Education details: there-ex, HEP, POC Person educated: Patient Education method: Explanation, Demonstration, Tactile cues, Verbal cues, and Handouts Education comprehension: verbalized understanding and returned demonstration  HOME EXERCISE PROGRAM: Access Code: [email or web address] URL: [email or web address] Date: 06/03/2024 Prepared by: Emil Glassman  Exercises - Supine Ankle Pumps  - 5 x daily - 7 x weekly - 3 sets - 10 reps - Seated Toe Curl  - 2 x daily - 7 x weekly - 3 sets - 10 reps - 5 seconds hold  ASSESSMENT:  CLINICAL IMPRESSION: Patient is a 53 y.o. female who was seen today for physical therapy evaluation and treatment for S/P R plantar fascia and gastroc release on 05/02/24. She also presents with altered gait pattern, B LE weakness, limited R ankle DF, IV, EV AROM, TTP, R LE swelling, and difficulty performing standing tasks, transfers, and ambulation secondary to current condition. Pt will benefit from skilled physical therapy services to address the aforementioned deficits.   OBJECTIVE IMPAIRMENTS: Abnormal gait, decreased activity tolerance, decreased balance, difficulty walking,  decreased ROM, decreased strength, improper body mechanics, postural dysfunction, and pain.   ACTIVITY LIMITATIONS: carrying, lifting, bending, standing, squatting, stairs, transfers, and locomotion level  PARTICIPATION LIMITATIONS:   PERSONAL FACTORS: Age, Fitness, Past/current experiences, Time since onset of injury/illness/exacerbation, and 3+ comorbidities: Aortic regurgitation, aortic valve replacement, arthritis, back surgery, obesity are also affecting patient's functional outcome.   REHAB POTENTIAL: Fair    CLINICAL DECISION MAKING: Stable/uncomplicated  EVALUATION COMPLEXITY: Low   GOALS: Goals reviewed with patient? Yes  SHORT TERM GOALS: Target date: 06/13/2024 Pt will be independent with her initial HEP to improve ROM, strength, function, and ability to ambulate with less difficulty.  Baseline: Pt has started her initial HEP (06/03/2024) Goal status: INITIAL    LONG TERM GOALS: Target date: 08/01/2024  Pt will be able to ambulate at least 500 ft without CAM boot and with SPC on L side to promote mobility.  Baseline: rw with CAM boot, short distances of about 60 ft at a time (06/03/2024)  Goal status: INITIAL  2.  Pt will improve R LE strength by at least 1/2 MMT grade to promote ability to ambulate, perform transfers, standing tasks, and negotiate stairs with less difficulty.  Baseline:  MMT Right eval Left eval  Hip flexion 4 4-  Hip extension (seated manually resisted) 4- 3+  Hip abduction (seated manually resisted) 4- 4-  Hip adduction    Hip internal rotation    Hip external rotation    Knee flexion 3+ 4  Knee extension 4- 4  Ankle dorsiflexion (supine manually resisted) 4   Ankle plantarflexion (supine manually resisted) 3+   Ankle inversion (supine manually resisted) 3+   Ankle eversion (supine manually resisted) 4-    Goal status: INITIAL  3.  Pt will improve her LEFS score by at least 20 points as a demonstration of improved function.  Baseline:  24/80 (9/2/025) Goal status: INITIAL  4.  Pt will improve R ankle DF AROM by at least 15 degrees and R ankle EV and IV bu at least 10 degrees to promote ability to ambulate and perform standing  tasks with less difficulty.  Baseline:  Active ROM Right eval  Ankle dorsiflexion -8 (5 degrees AAROM)  Ankle plantarflexion 53  Ankle inversion 16  Ankle eversion 20   Goal status: INITIAL    PLAN:  PT FREQUENCY: 1-2x/week  PT DURATION: 8 weeks  PLANNED INTERVENTIONS: 97110-Therapeutic exercises, 97530- Therapeutic activity, 97112- Neuromuscular re-education, 97535- Self Care, 02859- Manual therapy, [phone]- Gait training, [phone]- Aquatic Therapy, [phone]- Electrical stimulation (unattended), [phone]- Ionotophoresis 4mg /ml Dexamethasone , Patient/Family education, Balance training, Stair training, and Joint mobilization  PLAN FOR NEXT SESSION: trunk, hip, knee, ankle strengthening, R ankle ROM, gait, manual techniques, modalities PRN   Antavious Spanos, PT, DPT 06/03/2024, 12:06 PM

## 2024-06-05 ENCOUNTER — Other Ambulatory Visit: Payer: Self-pay

## 2024-06-05 ENCOUNTER — Ambulatory Visit

## 2024-06-05 ENCOUNTER — Emergency Department
Admission: EM | Admit: 2024-06-05 | Discharge: 2024-06-06 | Disposition: A | Discharge status: Home / Self Care | Attending: Emergency Medicine | Admitting: Emergency Medicine

## 2024-06-05 ENCOUNTER — Encounter: Payer: Self-pay | Admitting: *Deleted

## 2024-06-05 ENCOUNTER — Emergency Department

## 2024-06-05 DIAGNOSIS — H9221 Otorrhagia, right ear: Secondary | ICD-10-CM | POA: Insufficient documentation

## 2024-06-05 DIAGNOSIS — S00411A Abrasion of right ear, initial encounter: Secondary | ICD-10-CM | POA: Diagnosis not present

## 2024-06-05 DIAGNOSIS — R262 Difficulty in walking, not elsewhere classified: Secondary | ICD-10-CM

## 2024-06-05 DIAGNOSIS — R519 Headache, unspecified: Secondary | ICD-10-CM | POA: Diagnosis not present

## 2024-06-05 DIAGNOSIS — Z79899 Other long term (current) drug therapy: Secondary | ICD-10-CM | POA: Insufficient documentation

## 2024-06-05 DIAGNOSIS — Z7901 Long term (current) use of anticoagulants: Secondary | ICD-10-CM | POA: Insufficient documentation

## 2024-06-05 DIAGNOSIS — Z8616 Personal history of COVID-19: Secondary | ICD-10-CM | POA: Diagnosis not present

## 2024-06-05 DIAGNOSIS — M6281 Muscle weakness (generalized): Secondary | ICD-10-CM

## 2024-06-05 DIAGNOSIS — Z7982 Long term (current) use of aspirin: Secondary | ICD-10-CM | POA: Insufficient documentation

## 2024-06-05 DIAGNOSIS — M722 Plantar fascial fibromatosis: Secondary | ICD-10-CM | POA: Diagnosis not present

## 2024-06-05 DIAGNOSIS — Z955 Presence of coronary angioplasty implant and graft: Secondary | ICD-10-CM | POA: Insufficient documentation

## 2024-06-05 DIAGNOSIS — I10 Essential (primary) hypertension: Secondary | ICD-10-CM | POA: Diagnosis not present

## 2024-06-05 DIAGNOSIS — M25571 Pain in right ankle and joints of right foot: Secondary | ICD-10-CM | POA: Diagnosis not present

## 2024-06-05 DIAGNOSIS — M62461 Contracture of muscle, right lower leg: Secondary | ICD-10-CM | POA: Diagnosis not present

## 2024-06-05 LAB — BASIC METABOLIC PANEL WITH GFR
Anion gap: 14 (ref 5–15)
BUN: 13 mg/dL (ref 6–20)
CO2: 22 mmol/L (ref 22–32)
Calcium: 8.3 mg/dL — ABNORMAL LOW (ref 8.9–10.3)
Chloride: 105 mmol/L (ref 98–111)
Creatinine, Ser: 0.84 mg/dL (ref 0.44–1.00)
GFR, Estimated: >60 mL/min (ref >60)
Glucose, Bld: 135 mg/dL — ABNORMAL HIGH (ref 70–99)
Potassium: 3.6 mmol/L (ref 3.5–5.1)
Sodium: 141 mmol/L (ref 135–145)

## 2024-06-05 LAB — CBC
HCT: 38.4 % (ref 36.0–46.0)
Hemoglobin: 12.1 g/dL (ref 12.0–15.0)
MCH: 26.3 pg (ref 26.0–34.0)
MCHC: 31.5 g/dL (ref 30.0–36.0)
MCV: 83.5 fL (ref 80.0–100.0)
Platelets: 254 K/uL (ref 150–400)
RBC: 4.6 MIL/uL (ref 3.87–5.11)
RDW: 15.3 % (ref 11.5–15.5)
WBC: 8.5 K/uL (ref 4.0–10.5)
nRBC: 0 % (ref 0.0–0.2)

## 2024-06-05 MED ORDER — AMLODIPINE BESYLATE 5 MG PO TABS
5.0000 mg | ORAL_TABLET | Freq: Once | ORAL | Status: AC
  Administered 2024-06-06: 5 mg via ORAL
  Filled 2024-06-05: qty 1

## 2024-06-05 MED ORDER — LIDOCAINE-EPINEPHRINE (PF) 2 %-1:200000 IJ SOLN
20.0000 mL | Freq: Once | INTRAMUSCULAR | Status: AC
  Administered 2024-06-06: 20 mL
  Filled 2024-06-05: qty 20

## 2024-06-05 MED ORDER — ONDANSETRON 4 MG PO TBDP
4.0000 mg | ORAL_TABLET | Freq: Once | ORAL | Status: AC
  Administered 2024-06-06: 4 mg via ORAL
  Filled 2024-06-05: qty 1

## 2024-06-05 MED ORDER — TRANEXAMIC ACID FOR EPISTAXIS
500.0000 mg | Freq: Once | TOPICAL | Status: AC
  Administered 2024-06-06: 500 mg via TOPICAL
  Filled 2024-06-05 (×2): qty 10

## 2024-06-05 MED ORDER — HYDROCODONE-ACETAMINOPHEN 5-325 MG PO TABS
2.0000 | ORAL_TABLET | Freq: Once | ORAL | Status: AC
  Administered 2024-06-06: 2 via ORAL
  Filled 2024-06-05: qty 2

## 2024-06-05 NOTE — ED Provider Notes (Signed)
 [facility] Provider Note    Event Date/Time   First MD Initiated Contact with Patient 06/05/24 2334     (approximate)   History   Otalgia   HPI  Mary Chambers is a 53 y.o. female with history of hypertension, aortic valve replacement secondary to severe aortic regurgitation on Coumadin , chronic pain who presents to the emergency department with complaints of headache, right ear pain, bleeding from the right ear.  States she was seen urgent care and told she had an abrasion in the ear canal and has follow-up scheduled with ENT on Wednesday.  She does not remember any trauma to the head, ear but states this morning she did clean her ear with a Q-tip.  She states that she did not realize it was bleeding today until her physical therapist saw it.  She states she was worried because it continued to bleed and told her that the PA at urgent care told them to come in immediately for ENT evaluation.  Her blood pressure is elevated here.  She reports compliance with her amlodipine  and metoprolol .  Complaining of diffuse headache but no vision changes, chest pain or shortness of breath, numbness, tingling or weakness.  No fevers, cough or congestion.   History provided by patient.    Past Medical History:  Diagnosis Date   Abscess 06/23/2021   Achilles tendonitis 01/16/2014   Overview:   Followed by [employer].  Treated with brace and diclofenac .   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  Follow-up with Dr. Zena.  Watch for opportunities to increase physical activity within neuromuscular capabilities.   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   Transaminitis 06/23/2021   Urinary incontinence 06/03/2010   Vertebral osteomyelitis (HCC)     Past Surgical History:  Procedure Laterality Date   ABDOMINAL HYSTERECTOMY     AORTIC VALVE REPLACEMENT N/A 11/07/2021   Procedure: AORTIC VALVE REPLACEMENT USING A On-X AORTIC VALVE.;  Surgeon: Shyrl Linnie KIDD, MD;  Location: [facility] OR;  Service: Open Heart Surgery;  Laterality: N/A;   BACK SURGERY     BUBBLE STUDY  08/31/2021   Procedure: BUBBLE STUDY;  Surgeon: Hobart Powell BRAVO, MD;  Location: [facility] ENDOSCOPY;  Service: Cardiovascular;;   CARPAL TUNNEL RELEASE Bilateral    CHOLECYSTECTOMY     INGUINAL HERNIA REPAIR Left 02/29/2024   Procedure: REPAIR, HERNIA, INGUINAL, LAPAROSCOPIC;  Surgeon: Lyndel Deward PARAS, MD;  Location: [facility] ORS;  Service: General;  Laterality: Left;  LATERALITY: LEFT   IR FLUORO GUIDED NEEDLE PLC ASPIRATION/INJECTION LOC  06/23/2021   LITHOTRIPSY     neck fusion     RIGHT/LEFT HEART CATH AND CORONARY ANGIOGRAPHY N/A 10/07/2021   Procedure: RIGHT/LEFT HEART  CATH AND CORONARY ANGIOGRAPHY;  Surgeon: Dann Candyce RAMAN, MD;  Location: [facility] INVASIVE CV LAB;  Service: Cardiovascular;  Laterality: N/A;   TEE WITHOUT CARDIOVERSION N/A 06/28/2021   Procedure: TRANSESOPHAGEAL ECHOCARDIOGRAM (TEE);  Surgeon: Pietro Redell RAMAN, MD;  Location: [facility] ENDOSCOPY;  Service: Cardiovascular;  Laterality: N/A;   TEE WITHOUT CARDIOVERSION N/A 08/31/2021   Procedure: TRANSESOPHAGEAL ECHOCARDIOGRAM (TEE);  Surgeon: Hobart Powell BRAVO, MD;  Location: [facility] ENDOSCOPY;  Service: Cardiovascular;   Laterality: N/A;   TEE WITHOUT CARDIOVERSION N/A 11/07/2021   Procedure: TRANSESOPHAGEAL ECHOCARDIOGRAM (TEE);  Surgeon: Shyrl Linnie KIDD, MD;  Location: [facility] OR;  Service: Open Heart Surgery;  Laterality: N/A;   TUBAL LIGATION     UMBILICAL HERNIA REPAIR N/A 02/29/2024   Procedure: REPAIR, HERNIA, UMBILICAL, ADULT;  Surgeon: Lyndel Deward PARAS, MD;  Location: [facility] ORS;  Service: General;  Laterality: N/A;    MEDICATIONS:  Prior to Admission medications   Medication Sig Start Date End Date Taking? Authorizing Provider  amLODipine  (NORVASC ) 2.5 MG tablet TAKE 1 TABLET BY MOUTH EVERY DAY 04/17/24   Walker, Caitlin S, NP  amoxicillin  (AMOXIL ) 500 MG capsule TAKE 4 CAPSULES (2,000 MG TOTAL) BY MOUTH ONCE FOR 1 DOSE. PATIENT TO TAKE 4 TABLETS PRIOR TO DENTAL WORK. 04/08/24   Lonni Slain, MD  aspirin  EC 81 MG tablet Take 1 tablet (81 mg total) by mouth daily. Swallow whole. 08/15/22   Lonni Slain, MD  atorvastatin  (LIPITOR) 10 MG tablet TAKE 1 TABLET BY MOUTH EVERY DAY IN THE EVENING 07/26/23   Lonni Slain, MD  ELDERBERRY PO Take 50 mg by mouth daily.    [provider]  escitalopram  (LEXAPRO ) 20 MG tablet TAKE 1 TABLET BY MOUTH EVERY DAY Patient taking differently: Take 20 mg by mouth at bedtime. 01/14/24   de [place], Raymond J, MD  fluticasone  (FLONASE ) 50 MCG/ACT nasal spray SPRAY 2 SPRAYS INTO EACH NOSTRIL EVERY DAY 05/19/24   de [place], Quintin PARAS, MD  furosemide  (LASIX ) 20 MG tablet Take 1 tablet (20 mg total) by mouth daily. Patient taking differently: Take 20 mg by mouth daily as needed for edema. 08/15/22   Lonni Slain, MD  gabapentin (NEURONTIN) 100 MG capsule Take 100 mg by mouth 3 (three) times daily.    [provider]  gentamicin  ointment (GARAMYCIN ) 0.1 % Apply 1 Application topically daily. Apply to wound daily 05/28/24   McDonald, Juliene SAUNDERS, DPM  loperamide  (IMODIUM ) 2 MG capsule Take 1 capsule (2 mg total) by mouth as needed for  diarrhea or loose stools. Patient taking differently: Take 4 mg by mouth as needed for diarrhea or loose stools. 07/01/21   Samtani, Jai-Gurmukh, MD  Magnesium  250 MG CAPS Take 250 mg by mouth daily as needed (feet burning).    [provider]  metoprolol  tartrate (LOPRESSOR ) 25 MG tablet Take 0.5 tablets (12.5 mg total) by mouth 2 (two) times daily. 08/06/23   Lonni Slain, MD  Multiple Vitamin (MULTIVITAMIN WITH MINERALS) TABS tablet Take 2 tablets by mouth in the morning.    [provider]  naproxen  (NAPROSYN ) 250 MG tablet Take 250 mg by mouth 2 (two) times daily. 04/03/24   [provider]  nitrofurantoin , macrocrystal-monohydrate, (MACROBID ) 100 MG capsule Take 1 capsule (100 mg total) by mouth 2 (two) times daily. 06/02/24   Vivienne Delon HERO, PA-C  pantoprazole  (PROTONIX ) 40 MG tablet TAKE 1 TABLET BY MOUTH EVERY DAY 03/20/24   de [place], Raymond J, MD  Potassium 99 MG TABS Take 198 mg by  mouth daily as needed (when taking lasix ).    [provider]  tiZANidine  (ZANAFLEX ) 4 MG tablet Take 4 mg by mouth at bedtime.    [provider]  vitamin C  (ASCORBIC ACID ) 250 MG tablet Take 500 mg by mouth daily.    [provider]  warfarin (COUMADIN ) 5 MG tablet TAKE 1/2 TO 1 TABLET DAILY OR AS DIRECTED BY COUMADIN  CLINIC 03/19/24   Lonni Slain, MD    Physical Exam   Triage Vital Signs: ED Triage Vitals  Encounter Vitals Group     BP 06/05/24 2133 (!) 183/98     Girls Systolic BP Percentile --      Girls Diastolic BP Percentile --      Boys Systolic BP Percentile --      Boys Diastolic BP Percentile --      Pulse Rate 06/05/24 2133 (!) 106     Resp 06/05/24 2133 20     Temp 06/05/24 2133 98.5 F (36.9 C)     Temp Source 06/05/24 2133 Oral     SpO2 06/05/24 2133 98 %     Weight 06/05/24 2128 270 lb (122.5 kg)     Height 06/05/24 2128 5' 7 (1.702 m)     Head Circumference --      Peak Flow --      Pain Score 06/05/24  2128 8     Pain Loc --      Pain Education --      Exclude from Growth Chart --     Most recent vital signs: Vitals:   06/06/24 0130 06/06/24 0230  BP: (!) 174/94 (!) 167/90  Pulse: 81 81  Resp: 20   Temp:    SpO2: 96% 96%     CONSTITUTIONAL: Alert and responds appropriately to questions. Well-appearing; well-nourished HEAD: Normocephalic, atraumatic EYES: Conjunctivae clear, pupils appear equal ENT: normal nose; moist mucous membranes; patient has small amount of blood noted in the right external auditory canal with no obvious laceration, soft tissue swelling or other drainage.  TM appears intact and normal without effusion, associated redness or dullness NECK: Normal range of motion CARD: Regular rate and rhythm, normal heart sounds RESP: Normal chest excursion without splinting or tachypnea; no hypoxia or respiratory distress, speaking full sentences, lungs clear to auscultation ABD/GI: non-distended EXT: Normal ROM in all joints, no major deformities noted SKIN: Normal color for age and race, no rashes on exposed skin NEURO: Moves all extremities equally, normal speech, no facial asymmetry noted PSYCH: The patient's mood and manner are appropriate. Grooming and personal hygiene are appropriate.  ED Results / Procedures / Treatments   LABS: (all labs ordered are listed, but only abnormal results are displayed) Labs Reviewed  BASIC METABOLIC PANEL WITH GFR - Abnormal; Notable for the following components:      Result Value   Glucose, Bld 135 (*)    Calcium  8.3 (*)    All other components within normal limits  PROTIME-INR - Abnormal; Notable for the following components:   Prothrombin Time 25.9 (*)    INR 2.2 (*)    All other components within normal limits  CBC     EKG:    RADIOLOGY: My personal review and interpretation of imaging: CT head shows no acute abnormality.  I have personally reviewed all radiology reports. CT HEAD WO CONTRAST ( ) Result Date:  06/06/2024 CLINICAL DATA:  Headache, new onset (Age >= 51y) Headache, hypertension, on Coumadin  EXAM: CT HEAD WITHOUT CONTRAST TECHNIQUE: Contiguous axial  images were obtained from the base of the skull through the vertex without intravenous contrast. RADIATION DOSE REDUCTION: This exam was performed according to the departmental dose-optimization program which includes automated exposure control, adjustment of the mA and/or kV according to patient size and/or use of iterative reconstruction technique. COMPARISON:  None Available. FINDINGS: Brain: No evidence of acute infarction, hemorrhage, hydrocephalus, extra-axial collection or mass lesion/mass effect. Vascular: No hyperdense vessel or unexpected calcification. Skull: Normal. Negative for fracture or focal lesion. Sinuses/Orbits: No acute finding. Other: Mastoid air cells and middle ear cavities are clear. IMPRESSION: 1. No acute intracranial abnormality. Electronically Signed   By: Dorethia Molt M.D.   On: 06/06/2024 00:19     PROCEDURES:  Critical Care performed: No      Procedures    IMPRESSION / MDM / ASSESSMENT AND PLAN / ED COURSE  I reviewed the triage vital signs and the nursing notes.   Patient here with bleeding from the right ear canal.  Also complaining of headache with hypertension.  On Coumadin .     DIFFERENTIAL DIAGNOSIS (includes but not limited to):   Abrasion in the external auditory canal, no signs of otitis externa, otitis media, TM perforation or mastoiditis  Differential also includes coagulopathy, less likely anemia or thrombocytopenia  I am also concerned for potential intracranial hemorrhage, hypertensive headache, hypertensive urgency/emergency given headache with elevated blood pressure  Patient's presentation is most consistent with acute presentation with potential threat to life or bodily function.  PLAN: Labs obtained from triage show hemoglobin of 12.1.  Normal platelet count.  Normal electrolytes,  creatinine.  INR pending.  Will obtain CT head.  Will give pain medication, blood pressure medication and put topical lidocaine  with epinephrine  and TXA into her right ear to see if we can temporize any of the bleeding.   MEDICATIONS GIVEN IN ED: Medications  tranexamic acid  (CYKLOKAPRON ) 1000 MG/10ML topical solution 500 mg (500 mg Topical Given 06/06/24 0023)  lidocaine -EPINEPHrine  (XYLOCAINE  W/EPI) 2 %-1:200000 (PF) injection 20 mL (20 mLs Other Given 06/06/24 0023)  amLODipine  (NORVASC ) tablet 5 mg (5 mg Oral Given 06/06/24 0022)  HYDROcodone -acetaminophen  (NORCO/VICODIN) 5-325 MG per tablet 2 tablet (2 tablets Oral Given 06/06/24 0021)  ondansetron  (ZOFRAN -ODT) disintegrating tablet 4 mg (4 mg Oral Given 06/06/24 0021)  acetaminophen  (TYLENOL ) tablet 325 mg (325 mg Oral Given 06/06/24 0149)     ED COURSE: INR 2.2.  Discussed with patient that given bleeding is very mild and that it is not life-threatening bleeding they do not recommend reversing her anticoagulation given she has had an aortic valve replacement.  She verbalized understanding.  CT head reviewed and interpreted by myself and the radiologist and shows no acute abnormality.  Blood pressure improving with oral medications, pain medication.  Headache also resolved.  Bleeding from the right ear has improved but not completely stopped.  I am able to visualize the TM and it does not appear perforated and there is no side of otitis externa, otitis media or mastoiditis.  She already has an ENT follow-up scheduled.  I feel she is safe for discharge.  Will have her follow-up with her PCP as well given her elevated blood pressures here.  Recommended Tylenol  as needed at home.  At this time, I do not feel there is any life-threatening condition present. I reviewed all nursing notes, vitals, pertinent previous records.  All lab and urine results, EKGs, imaging ordered have been independently reviewed and interpreted by myself.  I reviewed all  available radiology reports  from any imaging ordered this visit.  Based on my assessment, I feel the patient is safe to be discharged home without further emergent workup and can continue workup as an outpatient as needed. Discussed all findings, treatment plan as well as usual and customary return precautions.  They verbalize understanding and are comfortable with this plan.  Outpatient follow-up has been provided as needed.  All questions have been answered.   CONSULTS:  none   OUTSIDE RECORDS REVIEWED: Reviewed recent family medicine, cardiology notes.     FINAL CLINICAL IMPRESSION(S) / ED DIAGNOSES   Final diagnoses:  Bleeding from right ear  Hypertension, unspecified type  Generalized headache     Rx / DC Orders   ED Discharge Orders     None        Note:  This document was prepared using Dragon voice recognition software and may include unintentional dictation errors.   Lorenza Shakir, Josette SAILOR, DO 06/06/24 [phone]

## 2024-06-05 NOTE — ED Triage Notes (Signed)
 Pt to triage via wheelchair.  Pt has bleeding from right ear.  Sx began this am  no known injury.   Pt alert  speech  clear.  Pt is on blood thinners.

## 2024-06-05 NOTE — Therapy (Signed)
 OUTPATIENT PHYSICAL THERAPY TREATMENT   Patient Name: Mary Chambers MRN: [id number] DOB:[birth year], 53 y.o., female Today's Date: 06/05/2024  END OF SESSION:  PT End of Session - 06/05/24 0952     Visit Number 2    Number of Visits 17    Date for PT Re-Evaluation 08/01/24    PT Start Time 0952   pt arrived late   PT Stop Time 1032    PT Time Calculation (min) 40 min    Activity Tolerance Patient tolerated treatment well    Behavior During Therapy [facility] for tasks assessed/performed           Past Medical History:  Diagnosis Date   Abscess 06/23/2021   Achilles tendonitis 01/16/2014   Overview:   Followed by [employer].  Treated with brace and diclofenac .   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  Follow-up with Dr. Zena.  Watch for opportunities to increase physical activity within neuromuscular capabilities.   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   Transaminitis 06/23/2021   Urinary incontinence 06/03/2010   Vertebral osteomyelitis (HCC)    Past Surgical History:  Procedure Laterality Date   ABDOMINAL HYSTERECTOMY     AORTIC VALVE REPLACEMENT N/A 11/07/2021   Procedure: AORTIC VALVE REPLACEMENT USING A On-X AORTIC VALVE.;  Surgeon: Shyrl Linnie KIDD, MD;   Location: [facility] OR;  Service: Open Heart Surgery;  Laterality: N/A;   BACK SURGERY     BUBBLE STUDY  08/31/2021   Procedure: BUBBLE STUDY;  Surgeon: Hobart Powell BRAVO, MD;  Location: [facility] ENDOSCOPY;  Service: Cardiovascular;;   CARPAL TUNNEL RELEASE Bilateral    CHOLECYSTECTOMY     INGUINAL HERNIA REPAIR Left 02/29/2024   Procedure: REPAIR, HERNIA, INGUINAL, LAPAROSCOPIC;  Surgeon: Lyndel Deward PARAS, MD;  Location: [facility] ORS;  Service: General;  Laterality: Left;  LATERALITY: LEFT   IR FLUORO GUIDED NEEDLE PLC ASPIRATION/INJECTION LOC  06/23/2021   LITHOTRIPSY     neck fusion     RIGHT/LEFT HEART CATH AND CORONARY ANGIOGRAPHY N/A 10/07/2021   Procedure: RIGHT/LEFT HEART CATH AND CORONARY ANGIOGRAPHY;  Surgeon: Dann Candyce RAMAN, MD;  Location: [facility] INVASIVE CV LAB;  Service: Cardiovascular;  Laterality: N/A;   TEE WITHOUT CARDIOVERSION N/A 06/28/2021   Procedure: TRANSESOPHAGEAL ECHOCARDIOGRAM (TEE);  Surgeon: Pietro Redell RAMAN, MD;  Location: [facility] ENDOSCOPY;  Service: Cardiovascular;  Laterality: N/A;   TEE WITHOUT CARDIOVERSION N/A 08/31/2021   Procedure: TRANSESOPHAGEAL ECHOCARDIOGRAM (TEE);  Surgeon: Hobart Powell BRAVO, MD;  Location: [facility] ENDOSCOPY;  Service: Cardiovascular;  Laterality: N/A;   TEE WITHOUT CARDIOVERSION N/A 11/07/2021   Procedure: TRANSESOPHAGEAL ECHOCARDIOGRAM (TEE);  Surgeon: Shyrl Linnie KIDD, MD;  Location: [facility] OR;  Service: Open Heart Surgery;  Laterality: N/A;   TUBAL LIGATION  UMBILICAL HERNIA REPAIR N/A 02/29/2024   Procedure: REPAIR, HERNIA, UMBILICAL, ADULT;  Surgeon: Lyndel Deward PARAS, MD;  Location: [facility] ORS;  Service: General;  Laterality: N/A;   Patient Active Problem List   Diagnosis Date Noted   History of hernia surgery 03/06/2024   Sprain of ulnar collateral ligament of metacarpophalangeal (MCP) joint of thumb 12/09/2023   Impaired ambulation 11/30/2023   Hypomagnesemia 11/30/2023   Hyperreflexia 07/02/2023   Iron  deficiency anemia 03/29/2023    Vitamin D  deficiency 03/29/2023   Pyelonephritis 03/23/2023   Acute pyelonephritis 03/22/2023   E coli bacteremia 03/22/2023   Wellness examination 07/27/2022   Lumbar pseudoarthrosis 12/22/2021   Idiopathic scoliosis and kyphoscoliosis 12/22/2021   Prediabetes 12/08/2021   Long term (current) use of anticoagulants 11/17/2021   S/P AVR (aortic valve replacement) 11/07/2021   Severe aortic regurgitation 07/27/2021   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   Septic arthritis (HCC) 06/23/2021   Gastroesophageal reflux disease 12/15/2020   Screening for colorectal cancer 12/15/2020   Nausea and vomiting 12/15/2020   History of gastric ulcer 12/15/2020   Thoracic aortic aneurysm (HCC) 05/12/2020   Lumbar spinal stenosis 10/01/2019   Nephrolithiasis 07/29/2019   Prepyloric ulcer 07/17/2018   Sleep apnea 06/25/2018   Pain in left foot 03/29/2018   Migraines 02/13/2018   Morbidly obese (HCC) 02/13/2018   Synovial cyst of lumbar spine 10/15/2017   Osteoarthritis of knee 07/13/2017   Lumbar disc disease with radiculopathy 08/01/2016   Fatigue 05/04/2016   Headache 05/04/2016   Numbness of foot 05/04/2016   Lumbar stenosis with neurogenic claudication 05/07/2015   Bilateral leg edema 04/21/2015   Renal atrophy, left 01/23/2014   Right lower quadrant pain 12/18/2013   Hypertension, benign 06/03/2010   Urinary incontinence 06/03/2010   Anxiety and depression 10/08/2007    PCP: de [place], Raymond J, MD   REFERRING PROVIDER:    Silva Juliene SAUNDERS, DPM    REFERRING DIAG: [phone] (ICD-10-CM) - Gastrocnemius equinus of right lower extremity M72.2 (ICD-10-CM) - Plantar fasciitis of right foot  THERAPY DIAG:  Pain in right ankle and joints of right foot  Difficulty in walking, not elsewhere classified  Muscle weakness (generalized)  Rationale for Evaluation and Treatment: Rehabilitation  ONSET DATE:  DOS 05/02/2024  SUBJECTIVE:   SUBJECTIVE STATEMENT: R foot was swollen. A  little sore on R foot. Hurts a little when she walks. 5/10 currently.     PERTINENT HISTORY: S/P plantar fascia and gastroc release on 05/02/24. Pt had a heel spur and real bad plantar facitis. Pt calf muscle was also lengthened due to tight calf muscle. Better able to DF her R foot after her surgery. PLOF: ambulation with SPC. Currently use a wc and walks with rw in the house and to get to and from her car. R heel has started hurting a little bit recently. Walks with rw in her house without her boot and her foot and heel are fine. Has to put antibiotic cream at her R inferior medial heel, surgeon aware.  Boot removal on 06/11/2024 at her doctor's appointment.      No latex band allergies per pt.  Blood pressure is controlled per pt.   PAIN:  Are you having pain? Yes: NPRS scale: 0/10 currently (R CAM boot on, pt sitting on wc) Pain location: R inferior medial heel Pain description: achy Aggravating factors:   Relieving factors: elevated, cold compress.   PRECAUTIONS: WBAT with boot  RED FLAGS: Bowel or bladder incontinence: No and Cauda equina syndrome:  No   WEIGHT BEARING RESTRICTIONS: WBAT with CAM boot on R  FALLS:  Has patient fallen in last 6 months? No  LIVING ENVIRONMENT: Lives with: lives with their son and female roommate Lives in: House/apartment Stairs: Yes: External: 3 steps; none Has following equipment at home: Single point cane, Walker - 2 wheeled, Wheelchair (manual), shower chair, Grab bars, and raised toilet seat  OCCUPATION: disabled since her heart valve surgery February 2023.   PLOF: MOD I with SPC  PATIENT GOALS: Get her strength back to be able to walk without her rw.   NEXT MD VISIT: 06/11/2024  OBJECTIVE:  Note: Objective measures were completed at Evaluation unless otherwise noted.  DIAGNOSTIC FINDINGS:   PATIENT SURVEYS:  LEFS  Extreme difficulty/unable (0), Quite a bit of difficulty (1), Moderate difficulty (2), Little difficulty (3), No  difficulty (4) Survey date:  06/03/2024  Any of your usual work, housework or school activities 1  2. Usual hobbies, recreational or sporting activities 1  3. Getting into/out of the bath 3  4. Walking between rooms 3  5. Putting on socks/shoes 3  6. Squatting  0  7. Lifting an object, like a bag of groceries from the floor 0  8. Performing light activities around your home 2  9. Performing heavy activities around your home 0  10. Getting into/out of a car 3  11. Walking 2 blocks 0  12. Walking 1 mile 0  13. Going up/down 10 stairs (1 flight) 2  14. Standing for 1 hour 0  15.  sitting for 1 hour 3  16. Running on even ground 0  17. Running on uneven ground 0  18. Making sharp turns while running fast 0  19. Hopping  0  20. Rolling over in bed 3  Score total:  24/80     COGNITION: Overall cognitive status: Within functional limits for tasks assessed     SENSATION:   EDEMA:  Swelling R leg, ankle and foot   MUSCLE LENGTH:  POSTURE:   PALPATION: TTP R posterior medial heel  Swelling R leg, ankle, foot  LOWER EXTREMITY ROM:  Active ROM Right eval Left eval  Hip flexion    Hip extension    Hip abduction    Hip adduction    Hip internal rotation    Hip external rotation    Knee flexion    Knee extension    Ankle dorsiflexion -8 (5 degrees AAROM)   Ankle plantarflexion 53   Ankle inversion 16   Ankle eversion 20    (Blank rows = not tested)  LOWER EXTREMITY MMT:  MMT Right eval Left eval  Hip flexion 4 4-  Hip extension (seated manually resisted) 4- 3+  Hip abduction (seated manually resisted) 4- 4-  Hip adduction    Hip internal rotation    Hip external rotation    Knee flexion 3+ 4  Knee extension 4- 4  Ankle dorsiflexion (supine manually resisted) 4   Ankle plantarflexion (supine manually resisted) 3+   Ankle inversion (supine manually resisted) 3+   Ankle eversion (supine manually resisted) 4-    (Blank rows = not tested)  LOWER EXTREMITY  SPECIAL TESTS:    FUNCTIONAL TESTS:    GAIT: Distance walked: 60 ft Assistive device utilized: Walker - 2 wheeled Level of assistance: CGA Comments: with CAM boot on. Altalgic, decreased stance R LE with L pelvic drop during R LE stance phase.  TREATMENT DATE: 06/05/2024   Gait training   Gait with CAM boot with rw CGA   100 ft   Then 100 ft with SBA  Therapeutic rest breaks secondary to fatigue    Improved exercise technique, movement at target joints, use of target muscles after min verbal, visual, tactile cues.    Therapeutic exercise   Standing with B UE assist   hip abduction    R 10x3  Hip extension bent over onto table    R 5x3  Seated    Ankle DF 10x5 sec for 2 sets  Ankle PF  10X5 sec for 2 sets   Heel slide soleus stretch 30 sec x 3   toe curls 10x5 seconds for 2 sets    toe extension 10x5 seconds for 2 sets  Improved exercise technique, movement at target joints, use of target muscles after mod verbal, visual, tactile cues.     PATIENT EDUCATION:  Education details: there-ex, HEP, POC Person educated: Patient Education method: Explanation, Demonstration, Tactile cues, Verbal cues, and Handouts Education comprehension: verbalized understanding and returned demonstration  HOME EXERCISE PROGRAM: Access Code: [email or web address] URL: [email or web address] Date: 06/03/2024 Prepared by: Emil Glassman  Exercises - Supine Ankle Pumps  - 5 x daily - 7 x weekly - 3 sets - 10 reps - Seated Toe Curl  - 2 x daily - 7 x weekly - 3 sets - 10 reps - 5 seconds hold  ASSESSMENT:  CLINICAL IMPRESSION: Worked on gait to promote mobility, pt able to ambulate up to 100 ft 2x today, therapeutic rest breaks provided after each bout secondary to fatigue. Worked on glute med, max, and ankle and intrinsic foot muscle strengthening to  promote ability to ambulate and perform standing tasks with less difficulty. Pt tolerated session well without aggravation of symptoms. Pt will benefit from continued skilled physical therapy services to improve strength, balance, function, and ability to ambulate with less difficulty.      OBJECTIVE IMPAIRMENTS: Abnormal gait, decreased activity tolerance, decreased balance, difficulty walking, decreased ROM, decreased strength, improper body mechanics, postural dysfunction, and pain.   ACTIVITY LIMITATIONS: carrying, lifting, bending, standing, squatting, stairs, transfers, and locomotion level  PARTICIPATION LIMITATIONS:   PERSONAL FACTORS: Age, Fitness, Past/current experiences, Time since onset of injury/illness/exacerbation, and 3+ comorbidities: Aortic regurgitation, aortic valve replacement, arthritis, back surgery, obesity are also affecting patient's functional outcome.   REHAB POTENTIAL: Fair    CLINICAL DECISION MAKING: Stable/uncomplicated  EVALUATION COMPLEXITY: Low   GOALS: Goals reviewed with patient? Yes  SHORT TERM GOALS: Target date: 06/13/2024 Pt will be independent with her initial HEP to improve ROM, strength, function, and ability to ambulate with less difficulty.  Baseline: Pt has started her initial HEP (06/03/2024) Goal status: INITIAL    LONG TERM GOALS: Target date: 08/01/2024  Pt will be able to ambulate at least 500 ft without CAM boot and with SPC on L side to promote mobility.  Baseline: rw with CAM boot, short distances of about 60 ft at a time (06/03/2024)  Goal status: INITIAL  2.  Pt will improve R LE strength by at least 1/2 MMT grade to promote ability to ambulate, perform transfers, standing tasks, and negotiate stairs with less difficulty.  Baseline:  MMT Right eval Left eval  Hip flexion 4 4-  Hip extension (seated manually resisted) 4- 3+  Hip abduction (seated manually resisted) 4- 4-  Hip adduction    Hip internal rotation    Hip  external rotation    Knee  flexion 3+ 4  Knee extension 4- 4  Ankle dorsiflexion (supine manually resisted) 4   Ankle plantarflexion (supine manually resisted) 3+   Ankle inversion (supine manually resisted) 3+   Ankle eversion (supine manually resisted) 4-    Goal status: INITIAL  3.  Pt will improve her LEFS score by at least 20 points as a demonstration of improved function.  Baseline: 24/80 (9/2/025) Goal status: INITIAL  4.  Pt will improve R ankle DF AROM by at least 15 degrees and R ankle EV and IV bu at least 10 degrees to promote ability to ambulate and perform standing tasks with less difficulty.  Baseline:  Active ROM Right eval  Ankle dorsiflexion -8 (5 degrees AAROM)  Ankle plantarflexion 53  Ankle inversion 16  Ankle eversion 20   Goal status: INITIAL    PLAN:  PT FREQUENCY: 1-2x/week  PT DURATION: 8 weeks  PLANNED INTERVENTIONS: 97110-Therapeutic exercises, 97530- Therapeutic activity, 97112- Neuromuscular re-education, 97535- Self Care, 02859- Manual therapy, [phone]- Gait training, [phone]- Aquatic Therapy, [phone]- Electrical stimulation (unattended), [phone]- Ionotophoresis 4mg /ml Dexamethasone , Patient/Family education, Balance training, Stair training, and Joint mobilization  PLAN FOR NEXT SESSION: trunk, hip, knee, ankle strengthening, R ankle ROM, gait, manual techniques, modalities PRN   Jahira Swiss, PT, DPT 06/05/2024, 12:26 PM

## 2024-06-06 ENCOUNTER — Telehealth: Payer: Self-pay | Admitting: Pharmacist

## 2024-06-06 ENCOUNTER — Ambulatory Visit (HOSPITAL_BASED_OUTPATIENT_CLINIC_OR_DEPARTMENT_OTHER): Admitting: Family Medicine

## 2024-06-06 DIAGNOSIS — I10 Essential (primary) hypertension: Secondary | ICD-10-CM | POA: Diagnosis not present

## 2024-06-06 LAB — PROTIME-INR
INR: 2.2 — ABNORMAL HIGH (ref 0.8–1.2)
Prothrombin Time: 25.9 s — ABNORMAL HIGH (ref 11.4–15.2)

## 2024-06-06 MED ORDER — ACETAMINOPHEN 325 MG PO TABS
325.0000 mg | ORAL_TABLET | Freq: Once | ORAL | Status: AC
  Administered 2024-06-06: 325 mg via ORAL
  Filled 2024-06-06: qty 1

## 2024-06-06 NOTE — Discharge Instructions (Signed)
 You may take Tylenol  1000 mg every 6 hours as needed for pain.  Your head CT showed no acute abnormality.  Your lab work was reassuring.  Your INR was therapeutic at 2.2 which is likely the reason it is difficult to get your bleeding to completely stop.  I recommend you continue your Coumadin  as prescribed however given your history of aortic valve replacement.  Please follow-up with ENT as scheduled next Wednesday.  Please use your antibiotic drops as scheduled.  You do not need oral antibiotics at this time.  Please do not put anything into your ear including Q-tips and do not submerge your head underwater.  You may use a cottonball at the opening of your ear canal to keep any blood or drainage from getting on your clothing, bed sheets, etc.  I recommend follow-up with your primary care doctor given your elevated blood pressures.

## 2024-06-09 ENCOUNTER — Ambulatory Visit

## 2024-06-11 ENCOUNTER — Encounter: Admitting: Podiatry

## 2024-06-11 DIAGNOSIS — H60533 Acute contact otitis externa, bilateral: Secondary | ICD-10-CM | POA: Diagnosis not present

## 2024-06-11 DIAGNOSIS — H9203 Otalgia, bilateral: Secondary | ICD-10-CM | POA: Diagnosis not present

## 2024-06-12 ENCOUNTER — Ambulatory Visit

## 2024-06-17 ENCOUNTER — Ambulatory Visit

## 2024-06-19 ENCOUNTER — Ambulatory Visit

## 2024-06-19 ENCOUNTER — Telehealth: Payer: Self-pay

## 2024-06-19 NOTE — Telephone Encounter (Signed)
 No show. Called patient and left a message pertaining to appointment and a reminder for the next follow up session. Return phone call requested. Phone number [phone]) provided.

## 2024-06-24 ENCOUNTER — Telehealth: Payer: Self-pay

## 2024-06-24 ENCOUNTER — Ambulatory Visit

## 2024-06-24 NOTE — Telephone Encounter (Signed)
 No show. Called patient and left message pertaining to today's appointment and no show policy. Due to the policy, all remaining appointments are cancelled. If pt would like to schedule more appointments, it will be one appointment at a time. If discharged, pt will need a new referral from her doctor.

## 2024-06-25 ENCOUNTER — Ambulatory Visit

## 2024-06-26 ENCOUNTER — Ambulatory Visit

## 2024-07-01 ENCOUNTER — Ambulatory Visit

## 2024-07-02 ENCOUNTER — Telehealth: Payer: Self-pay | Admitting: *Deleted

## 2024-07-02 ENCOUNTER — Ambulatory Visit: Attending: Cardiology

## 2024-07-02 DIAGNOSIS — Z7901 Long term (current) use of anticoagulants: Secondary | ICD-10-CM

## 2024-07-02 DIAGNOSIS — I4891 Unspecified atrial fibrillation: Secondary | ICD-10-CM | POA: Insufficient documentation

## 2024-07-02 DIAGNOSIS — Z952 Presence of prosthetic heart valve: Secondary | ICD-10-CM

## 2024-07-02 DIAGNOSIS — M5116 Intervertebral disc disorders with radiculopathy, lumbar region: Secondary | ICD-10-CM | POA: Diagnosis not present

## 2024-07-02 DIAGNOSIS — I351 Nonrheumatic aortic (valve) insufficiency: Secondary | ICD-10-CM

## 2024-07-02 DIAGNOSIS — G4739 Other sleep apnea: Secondary | ICD-10-CM | POA: Diagnosis not present

## 2024-07-02 DIAGNOSIS — I824Y9 Acute embolism and thrombosis of unspecified deep veins of unspecified proximal lower extremity: Secondary | ICD-10-CM | POA: Insufficient documentation

## 2024-07-02 LAB — POCT INR: INR: 2.2 (ref 2.0–3.0)

## 2024-07-02 NOTE — Patient Instructions (Signed)
 Continue 1 tablet daily, except 0.5 tablet every Monday.  INR in 4 weeks.  [phone]

## 2024-07-02 NOTE — Telephone Encounter (Signed)
 Patient called and stated she saw the [facility] today and her INR was 2.2; she states that in the past her dose would be altered to help it decrease. Also, she states she was see in the [facility] on 9/4 and it was 2.2 as well. Advised that since she is a little over her goal of 1.5-2.0; she can take 1/2 tablet of warfarin today then resume normal dose of warfarin which is 1 tablet daily except 1/2 tablet on Mondays (using 5mg  tablet). She wanted to change appointment from [facility] to [facility] on the next visit and made her an appointment in [place].

## 2024-07-03 ENCOUNTER — Ambulatory Visit

## 2024-07-04 ENCOUNTER — Ambulatory Visit: Admitting: Physical Therapy

## 2024-07-07 ENCOUNTER — Other Ambulatory Visit (HOSPITAL_BASED_OUTPATIENT_CLINIC_OR_DEPARTMENT_OTHER): Payer: Self-pay

## 2024-07-07 ENCOUNTER — Ambulatory Visit (INDEPENDENT_AMBULATORY_CARE_PROVIDER_SITE_OTHER): Admitting: Cardiology

## 2024-07-07 ENCOUNTER — Encounter (HOSPITAL_BASED_OUTPATIENT_CLINIC_OR_DEPARTMENT_OTHER): Payer: Self-pay | Admitting: Cardiology

## 2024-07-07 VITALS — BP 107/54 | HR 85 | Ht 67.0 in | Wt 313.4 lb

## 2024-07-07 DIAGNOSIS — Z952 Presence of prosthetic heart valve: Secondary | ICD-10-CM

## 2024-07-07 DIAGNOSIS — Z7901 Long term (current) use of anticoagulants: Secondary | ICD-10-CM

## 2024-07-07 DIAGNOSIS — I10 Essential (primary) hypertension: Secondary | ICD-10-CM | POA: Diagnosis not present

## 2024-07-07 DIAGNOSIS — G4733 Obstructive sleep apnea (adult) (pediatric): Secondary | ICD-10-CM

## 2024-07-07 MED ORDER — FLUZONE 0.5 ML IM SUSY
0.5000 mL | PREFILLED_SYRINGE | Freq: Once | INTRAMUSCULAR | 0 refills | Status: AC
  Filled 2024-07-07: qty 0.5, 1d supply, fill #0

## 2024-07-07 NOTE — Patient Instructions (Signed)

## 2024-07-07 NOTE — Progress Notes (Signed)
 Cardiology Office Note:  .   Date:  07/07/2024  ID:  Mary Chambers, DOB Aug 24, 1971, MRN [id number] PCP: Lendia Boby CROME, NP-C  [facility] Providers Cardiologist:  Shelda Bruckner, MD {  History of Present Illness: .   Mary Chambers is a 54 y.o. female with a hx of aortic valve endocarditis s/p AVR 2023, hypertension, obesity, arthritis, and depression, who is seen for follow-up.   Today: Frustrated today as she has gained weight. Did not tolerate low dose Wegovy  in the past; she is willing to trial tirzepatide but she is on medicare, will have to check and see if this is covered for sleep apnea.  Has had hernia surgery, leg surgery, mouth surgery since our last visit, no issues at all.   Blood pressure has been variable at home. Sometimes has low readings, and she will hold amlodipine  on those days. Lowest she has seen 107/54.   ROS: Denies chest pain, shortness of breath at rest or with normal exertion. No PND, orthopnea, LE edema or unexpected weight gain. No syncope or palpitations. ROS otherwise negative except as noted.   Studies Reviewed: SABRA    EKG:       Physical Exam:   VS:  BP (!) 107/54 Comment: home  Pulse 85   Ht 5' 7 (1.702 m)   Wt (!) 313 lb 6.4 oz (142.2 kg)   SpO2 96%   BMI 49.09 kg/m    Wt Readings from Last 3 Encounters:  07/07/24 (!) 313 lb 6.4 oz (142.2 kg)  06/05/24 270 lb (122.5 kg)  05/28/24 271 lb (122.9 kg)    GEN: Well nourished, well developed in no acute distress HEENT: Normal, moist mucous membranes NECK: No JVD CARDIAC: regular rhythm, normal S1 and S2, no rubs or gallops. No murmur. VASCULAR: Radial and DP pulses 2+ bilaterally. No carotid bruits RESPIRATORY:  Clear to auscultation without rales, wheezing or rhonchi  ABDOMEN: Soft, non-tender, non-distended MUSCULOSKELETAL:  normal bulk and tone SKIN: Warm and dry, no edema NEUROLOGIC:  Alert and oriented x 3. No focal neuro deficits noted. PSYCHIATRIC:  Normal  affect    ASSESSMENT AND PLAN: .    Aortic valve endocarditis Severe aortic regurgitation Bicuspid aortic valve S/P AVR with 23mm On-X valve 11/07/21, Dr. Shyrl -on aspirin , coumadin  -has amoxicillin  for dental prophylaxis   Hypertension LE edema -continue low dose amlodipine  -only needs rare lasix , none recently   Morbid obesity BMI 43->49 -see evaluation for GLP in note 06/28/23. Weight at that visit 262 lb -unfortunately did not tolerate 0.5 mg semaglutide  due to vomiting, nausea, and abdominal pain with hard stools. She thought this was food poisoning, but given the duration and the fact that the symptoms resolved with stopping Wegovy , would not retrial. -she would consider tirzepatide if it is covered by her insurance  OSA -stop-bang=7 -was on CPAP >5 years ago -will see if GLP covered for this. Sent a message to prior auth team.  CV risk counseling and prevention -recommend heart healthy/Mediterranean diet, with whole grains, fruits, vegetable, fish, lean meats, nuts, and olive oil. Limit salt. -recommend moderate walking, 3-5 times/week for 30-50 minutes each session. Aim for at least 150 minutes.week. Goal should be pace of 3 miles/hours, or walking 1.5 miles in 30 minutes -recommend avoidance of tobacco products. Avoid excess alcohol. -ASCVD risk score: The 10-year ASCVD risk score (Arnett DK, et al., 2019) is: 1.7%   Values used to calculate the score:     Age: 53 years  Clincally relevant sex: Female     Is Non-Hispanic African American: No     Diabetic: No     Tobacco smoker: No     Systolic Blood Pressure: 107 mmHg     Is BP treated: Yes     HDL Cholesterol: 54 mg/dL     Total Cholesterol: 192 mg/dL    Dispo: 6 months or sooner as needed  Signed, Shelda Bruckner, MD   Shelda Bruckner, MD, PhD, [facility]  [facility]  [place]  [facility] at [facility] [address] [place], [place] [address] [phone]

## 2024-07-08 ENCOUNTER — Encounter

## 2024-07-11 ENCOUNTER — Encounter

## 2024-07-11 ENCOUNTER — Telehealth: Admitting: Family Medicine

## 2024-07-11 DIAGNOSIS — R399 Unspecified symptoms and signs involving the genitourinary system: Secondary | ICD-10-CM | POA: Diagnosis not present

## 2024-07-11 MED ORDER — CEPHALEXIN 500 MG PO CAPS
500.0000 mg | ORAL_CAPSULE | Freq: Two times a day (BID) | ORAL | 0 refills | Status: AC
Start: 2024-07-11 — End: 2024-07-18

## 2024-07-11 NOTE — Progress Notes (Signed)

## 2024-07-15 ENCOUNTER — Encounter

## 2024-07-16 ENCOUNTER — Ambulatory Visit

## 2024-07-18 ENCOUNTER — Encounter

## 2024-07-18 ENCOUNTER — Other Ambulatory Visit: Payer: Self-pay | Admitting: Otolaryngology

## 2024-07-18 ENCOUNTER — Telehealth: Admitting: Physician Assistant

## 2024-07-18 ENCOUNTER — Other Ambulatory Visit (HOSPITAL_BASED_OUTPATIENT_CLINIC_OR_DEPARTMENT_OTHER): Payer: Self-pay | Admitting: Family Medicine

## 2024-07-18 DIAGNOSIS — F32A Depression, unspecified: Secondary | ICD-10-CM

## 2024-07-18 DIAGNOSIS — H9201 Otalgia, right ear: Secondary | ICD-10-CM | POA: Diagnosis not present

## 2024-07-18 DIAGNOSIS — J069 Acute upper respiratory infection, unspecified: Secondary | ICD-10-CM

## 2024-07-18 MED ORDER — BENZONATATE 100 MG PO CAPS: 100.0000 mg | ORAL_CAPSULE | Freq: Three times a day (TID) | ORAL | 0 refills | Status: DC | PRN

## 2024-07-18 MED ORDER — LIDOCAINE VISCOUS HCL 2 % MT SOLN: 5.0000 mL | OROMUCOSAL | 0 refills | Status: DC | PRN

## 2024-07-18 NOTE — Progress Notes (Signed)
 E-Visit for COVID/Influenza Screening  Your current symptoms could be consistent with COVID-19 or Influenza. Please complete a COVID + Flu Test at home, or check with your local pharmacy to see if they provide testing.   If you have tested positive for either COVID or Influenza, it means that you were infected with that particular virus and could give the virus to others.  Most people with these infections have a mild illness and can recover at home without medical care. Do not leave your home, except to get medical care.  DO not visit public areas and do not go to places where you are unable to wear a mask. It is important for you to stay home to take care of yourself and to help protect other people in your home and community.   Isolation Instructions:  You are to isolate at home for now until you have taken your home COVID/Flu test and notified our team of your results, at which time further isolation instructions will be given.  If you must be around other household members who do not have symptoms, you need to make sure that both you and the family members are masking consistently with a high-quality mask, even while in the home.  If you note any worsening of symptoms despite treatment, please seek an IN-PERSON evaluation ASAP. If you note any significant shortness of breath or any chest pain, please seek immediate ER evaluation. Please do not delay care!  Go to the nearest hospital ED for assessment if fever/cough/breathlessness are severe or illness seems like a threat to life.    The following symptoms may appear 2-14 days after exposure: Fever Cough Shortness of breath or difficulty breathing Chills Repeated shaking with chills Muscle pain Headache Sore throat New loss of taste or smell Fatigue Congestion or runny nose Nausea or vomiting Diarrhea   For symptoms,  I have prescribed Tessalon  Perles 100 mg. You may take 1-2 capsules every 8 hours as needed for cough and Viscous  Lidocaine  2% Swallow 5mL every 4-6 hours as needed for sore throat You may also take acetaminophen  (Tylenol ) as needed for fever.   Reduce your risk of any infection by using the same precautions used for avoiding the common cold or flu:  Wash your hands often with soap and warm water  for at least 20 seconds.  If soap and water  are not readily available, use an alcohol-based hand sanitizer with at least 60% alcohol.  If coughing or sneezing, cover your mouth and nose by coughing or sneezing into the elbow areas of your shirt or coat, into a tissue or into your sleeve (not your hands). Avoid shaking hands with others and consider head nods or verbal greetings only. Avoid touching your eyes, nose, or mouth with unwashed hands.  Avoid close contact with people who are sick. Avoid places or events with large numbers of people in one location, like concerts or sporting events. Carefully consider travel plans you have or are making. If you are planning any travel outside or inside the [place] , visit the CDC's Travelers' Health webpage for the latest health notices. If you have some symptoms but not all symptoms, continue to monitor at home and seek medical attention if your symptoms worsen. If you are having a medical emergency, call 911.  HOME CARE Only take medications as instructed by your medical team. Drink plenty of fluids and get plenty of rest. A steam or ultrasonic humidifier can help if you have congestion.   GET HELP  RIGHT AWAY IF YOU HAVE EMERGENCY WARNING SIGNS** FOR COVID-19. If you or someone is showing any of these signs seek emergency medical care immediately. Call 911 or proceed to your closest emergency facility if: You develop worsening high fever. Trouble breathing. Bluish lips or face. Persistent pain or pressure in the chest. New confusion. Inability to wake or stay awake. You cough up blood. Your symptoms become more severe.  **This list is not all possible symptoms.  Contact your medical provider for any symptoms that are sever or concerning to you.   MAKE SURE YOU  Understand these instructions. Will watch your condition. Will get help right away if you are not doing well or get worse.  Your e-visit answers were reviewed by a board certified advanced clinical practitioner to complete your personal care plan.  Depending on the condition, your plan could have included both over the counter or prescription medications.  If there is a problem, please reply once you have received a response from your provider.  Your safety is important to us .  If you have drug allergies check your prescription carefully.    You can use MyChart to ask questions about today's visit, request a non-urgent call back, or ask for a work or school excuse for 24 hours related to this e-Visit. If it has been greater than 24 hours you will need to follow up with your provider, or enter a new e-Visit to address those concerns. You will get an e-mail in the next two days asking about your experience.  I hope that your e-visit has been valuable and will speed your recovery. Thank you for using e-visits.   I have spent 5 minutes in review of e-visit questionnaire, review and updating patient chart, medical decision making and response to patient.   Delon CHRISTELLA Dickinson, PA-C

## 2024-07-19 ENCOUNTER — Other Ambulatory Visit (HOSPITAL_BASED_OUTPATIENT_CLINIC_OR_DEPARTMENT_OTHER): Payer: Self-pay | Admitting: Family

## 2024-07-19 DIAGNOSIS — R002 Palpitations: Secondary | ICD-10-CM

## 2024-07-21 ENCOUNTER — Encounter (HOSPITAL_BASED_OUTPATIENT_CLINIC_OR_DEPARTMENT_OTHER): Payer: Self-pay

## 2024-07-21 ENCOUNTER — Other Ambulatory Visit (HOSPITAL_COMMUNITY): Payer: Self-pay

## 2024-07-21 ENCOUNTER — Telehealth: Payer: Self-pay | Admitting: Pharmacy Technician

## 2024-07-21 ENCOUNTER — Encounter

## 2024-07-21 NOTE — Telephone Encounter (Signed)
 Pharmacy Patient Advocate Encounter   Received notification from Physician's Office that prior authorization for zepbound 2.5 MG is required/requested.   Insurance verification completed.   The patient is insured through [employer].   Per test claim: PA required; PA submitted to above mentioned insurance via Latent Key/confirmation #/EOC [id number] Status is pending

## 2024-07-21 NOTE — Telephone Encounter (Signed)
 Pharmacy Patient Advocate Encounter  Received notification from [facility] that Prior Authorization for Zepbound has been APPROVED from 07/21/24 to 10/01/25. Ran test claim, Copay is $0.00- one month . This test claim was processed through [facility]- copay amounts may vary at other pharmacies due to pharmacy/plan contracts, or as the patient moves through the different stages of their insurance plan.   PA #/Case ID/Reference #: [id number]

## 2024-07-22 ENCOUNTER — Other Ambulatory Visit (HOSPITAL_BASED_OUTPATIENT_CLINIC_OR_DEPARTMENT_OTHER): Payer: Self-pay | Admitting: Cardiology

## 2024-07-22 ENCOUNTER — Other Ambulatory Visit (HOSPITAL_BASED_OUTPATIENT_CLINIC_OR_DEPARTMENT_OTHER): Payer: Self-pay

## 2024-07-22 DIAGNOSIS — E7800 Pure hypercholesterolemia, unspecified: Secondary | ICD-10-CM

## 2024-07-22 MED ORDER — ZEPBOUND 15 MG/0.5ML ~~LOC~~ SOAJ
15.0000 mg | SUBCUTANEOUS | 0 refills | Status: DC
  Filled 2024-07-22: qty 2, 28d supply, fill #0

## 2024-07-22 MED ORDER — ZEPBOUND 7.5 MG/0.5ML ~~LOC~~ SOAJ
7.5000 mg | SUBCUTANEOUS | 0 refills | Status: DC
  Filled 2024-07-22 – 2024-09-03 (×2): qty 2, 28d supply, fill #0

## 2024-07-22 MED ORDER — ZEPBOUND 2.5 MG/0.5ML ~~LOC~~ SOAJ
2.5000 mg | SUBCUTANEOUS | 0 refills | Status: DC
  Filled 2024-07-22: qty 2, 28d supply, fill #0

## 2024-07-22 MED ORDER — ZEPBOUND 5 MG/0.5ML ~~LOC~~ SOAJ
5.0000 mg | SUBCUTANEOUS | 0 refills | Status: DC
  Filled 2024-07-22 – 2024-08-20 (×2): qty 2, 28d supply, fill #0

## 2024-07-22 MED ORDER — ZEPBOUND 10 MG/0.5ML ~~LOC~~ SOAJ
10.0000 mg | SUBCUTANEOUS | 0 refills | Status: AC
  Filled 2024-07-22 – 2024-10-14 (×4): qty 2, 28d supply, fill #0

## 2024-07-22 MED ORDER — ZEPBOUND 12.5 MG/0.5ML ~~LOC~~ SOAJ
12.5000 mg | SUBCUTANEOUS | 0 refills | Status: DC
  Filled 2024-07-22: qty 2, 28d supply, fill #0

## 2024-07-23 ENCOUNTER — Encounter

## 2024-07-23 ENCOUNTER — Other Ambulatory Visit (HOSPITAL_COMMUNITY): Payer: Self-pay

## 2024-07-23 DIAGNOSIS — G4733 Obstructive sleep apnea (adult) (pediatric): Secondary | ICD-10-CM | POA: Diagnosis not present

## 2024-07-24 ENCOUNTER — Ambulatory Visit
Admission: RE | Admit: 2024-07-24 | Discharge: 2024-07-24 | Disposition: A | Discharge status: Home / Self Care | Source: Ambulatory Visit | Attending: Otolaryngology | Admitting: Otolaryngology

## 2024-07-24 ENCOUNTER — Ambulatory Visit: Admitting: Family Medicine

## 2024-07-24 DIAGNOSIS — H9201 Otalgia, right ear: Secondary | ICD-10-CM

## 2024-07-24 DIAGNOSIS — J331 Polypoid sinus degeneration: Secondary | ICD-10-CM | POA: Diagnosis not present

## 2024-07-25 ENCOUNTER — Other Ambulatory Visit (HOSPITAL_BASED_OUTPATIENT_CLINIC_OR_DEPARTMENT_OTHER): Payer: Self-pay

## 2024-07-27 ENCOUNTER — Other Ambulatory Visit (HOSPITAL_BASED_OUTPATIENT_CLINIC_OR_DEPARTMENT_OTHER): Payer: Self-pay | Admitting: Cardiology

## 2024-07-28 ENCOUNTER — Ambulatory Visit (INDEPENDENT_AMBULATORY_CARE_PROVIDER_SITE_OTHER): Admitting: Podiatry

## 2024-07-28 ENCOUNTER — Encounter

## 2024-07-28 VITALS — Ht 67.0 in | Wt 313.4 lb

## 2024-07-28 DIAGNOSIS — M62461 Contracture of muscle, right lower leg: Secondary | ICD-10-CM

## 2024-07-28 DIAGNOSIS — M722 Plantar fascial fibromatosis: Secondary | ICD-10-CM

## 2024-07-28 NOTE — Progress Notes (Signed)
  Subjective:  Patient ID: MORENA MCKISSACK, female    DOB: [birth year],  MRN: [id number]  Chief Complaint  Patient presents with   Foot Pain    Rm 7 Patient is here for pain in right ankle and swelling in right foot ( from 3rd right toe to the lateral side of the right foot). Pt states wearing walking boot intermittently for pain relief.      53 y.o. female returns for post-op check.  Doing okay still dealing with pain and swelling volar and lateral side.  Had to take a break from PT  Review of Systems: Negative except as noted in the HPI. Denies N/V/F/Ch.   Objective:  There were no vitals filed for this visit. Body mass index is 49.09 kg/m. Constitutional Well developed. Well nourished.  Vascular Foot warm and well perfused. Capillary refill normal to all digits.  Calf is soft and supple, no posterior calf or knee pain, negative Homans' sign  Neurologic Normal speech. Oriented to person, place, and time. Epicritic sensation to light touch grossly present bilaterally.  Dermatologic Incisions well-healed not hypertrophic  Orthopedic: There is no pain to palpation noted about the surgical site.  Some decrease sensation on the lateral forefoot   Assessment:   1. Gastrocnemius equinus of right lower extremity   2. Plantar fasciitis of right foot     Plan:  Patient was evaluated and treated and all questions answered.  S/p foot surgery right She will restart physical therapy next month.  Discussed will take some time for strengthening and conditioning.  Weightbearing as tolerated in regular shoes.  Return in 3 months to reevaluate.  No follow-ups on file.

## 2024-07-29 ENCOUNTER — Ambulatory Visit: Admitting: Family Medicine

## 2024-07-29 ENCOUNTER — Encounter: Payer: Self-pay | Admitting: Family Medicine

## 2024-07-29 VITALS — BP 134/90 | HR 88 | Temp 97.8°F | Ht 67.0 in | Wt 312.0 lb

## 2024-07-29 DIAGNOSIS — M549 Dorsalgia, unspecified: Secondary | ICD-10-CM

## 2024-07-29 DIAGNOSIS — K219 Gastro-esophageal reflux disease without esophagitis: Secondary | ICD-10-CM | POA: Diagnosis not present

## 2024-07-29 DIAGNOSIS — Z952 Presence of prosthetic heart valve: Secondary | ICD-10-CM | POA: Diagnosis not present

## 2024-07-29 DIAGNOSIS — Z7901 Long term (current) use of anticoagulants: Secondary | ICD-10-CM | POA: Diagnosis not present

## 2024-07-29 DIAGNOSIS — F419 Anxiety disorder, unspecified: Secondary | ICD-10-CM

## 2024-07-29 DIAGNOSIS — R7303 Prediabetes: Secondary | ICD-10-CM | POA: Diagnosis not present

## 2024-07-29 DIAGNOSIS — E559 Vitamin D deficiency, unspecified: Secondary | ICD-10-CM

## 2024-07-29 DIAGNOSIS — N941 Unspecified dyspareunia: Secondary | ICD-10-CM

## 2024-07-29 DIAGNOSIS — Z9889 Other specified postprocedural states: Secondary | ICD-10-CM

## 2024-07-29 DIAGNOSIS — I10 Essential (primary) hypertension: Secondary | ICD-10-CM

## 2024-07-29 DIAGNOSIS — F32A Depression, unspecified: Secondary | ICD-10-CM

## 2024-07-29 NOTE — Patient Instructions (Signed)
 Thank you for trusting us  with your health care.  I recommend establishing with a new gynecologist and we can assist you with a referral if needed.  Please schedule a welcome to Medicare visit and this must be in the first 12 months of you have a Medicare.

## 2024-07-29 NOTE — Progress Notes (Signed)
 New Patient Office Visit  Subjective    Patient ID: Mary Chambers, female    DOB: 09/04/1971  Age: 53 y.o. MRN: [id number]  CC:  Chief Complaint  Patient presents with   Establish Care    -blew nose this morning and looked like blood clots -Discuss new menopause medication, is on estroven OTC.  -urinary incontinence  -dryness during sex -covid booster?    Discussed the use of AI scribe software for clinical note transcription with the patient, who gave verbal consent to proceed.  History of Present Illness Mary Chambers is a 53 year old female who presents to establish care and discuss menopausal symptoms.  Menopausal symptoms - Vaginal dryness during intercourse - Takes Estroven for mood changes and other menopausal symptoms - History of hysterectomy - No current gynecologist  Chronic back and lower extremity symptoms - under the care of pain management  - History of cervical, thoracic, and lower back surgeries, most recently performed by Dr. Beverli at [facility] injections every three months for bilateral lower back pain - Takes gabapentin for tingling sensations as sensation returns in legs and feet - Adjusting to functional limitations due to leg symptoms, which sometimes affect mood  Cardiac history and anticoagulation - History of heart valve replacement - Under cardiology care - Takes warfarin and metoprolol  - Monitors blood pressure daily - Takes atorvastatin  and amlodipine  - Monitored at Coumadin  Clinic - Recent blood work completed  Gastrointestinal symptoms and metabolic risk - History of hernia repair with mesh placement - Takes pantoprazole  for acid reflux - History of prediabetes - Family history of diabetes  Adverse medication effects - Experienced adverse effects from Wegovy  last year  Otolaryngologic symptoms - History of CT scan for ear pain   Outpatient Encounter Medications as of 07/29/2024  Medication Sig    amLODipine  (NORVASC ) 2.5 MG tablet TAKE 1 TABLET BY MOUTH EVERY DAY   aspirin  EC 81 MG tablet Take 1 tablet (81 mg total) by mouth daily. Swallow whole.   atorvastatin  (LIPITOR) 10 MG tablet TAKE 1 TABLET BY MOUTH EVERY DAY IN THE EVENING   ELDERBERRY PO Take 50 mg by mouth daily.   escitalopram  (LEXAPRO ) 20 MG tablet TAKE 1 TABLET BY MOUTH EVERY DAY   fluticasone  (FLONASE ) 50 MCG/ACT nasal spray SPRAY 2 SPRAYS INTO EACH NOSTRIL EVERY DAY   furosemide  (LASIX ) 20 MG tablet Take 1 tablet (20 mg total) by mouth daily.   gabapentin (NEURONTIN) 100 MG capsule Take 100 mg by mouth 3 (three) times daily.   loperamide  (IMODIUM ) 2 MG capsule Take 1 capsule (2 mg total) by mouth as needed for diarrhea or loose stools.   Magnesium  250 MG CAPS Take 250 mg by mouth daily as needed (feet burning).   Multiple Vitamin (MULTIVITAMIN WITH MINERALS) TABS tablet Take 2 tablets by mouth in the morning.   pantoprazole  (PROTONIX ) 40 MG tablet TAKE 1 TABLET BY MOUTH EVERY DAY   Potassium 99 MG TABS Take 198 mg by mouth daily as needed (when taking lasix ).   tiZANidine  (ZANAFLEX ) 4 MG tablet Take 4 mg by mouth at bedtime.   vitamin C  (ASCORBIC ACID ) 250 MG tablet Take 500 mg by mouth daily.   warfarin (COUMADIN ) 5 MG tablet TAKE 1/2 TO 1 TABLET DAILY OR AS DIRECTED BY COUMADIN  CLINIC   [DISCONTINUED] metoprolol  tartrate (LOPRESSOR ) 25 MG tablet Take 0.5 tablets (12.5 mg total) by mouth 2 (two) times daily.   amoxicillin  (AMOXIL ) 500 MG capsule TAKE 4  CAPSULES (2,000 MG TOTAL) BY MOUTH ONCE FOR 1 DOSE. PATIENT TO TAKE 4 TABLETS PRIOR TO DENTAL WORK. (Patient not taking: Reported on 07/29/2024)   naproxen  (NAPROSYN ) 250 MG tablet Take 250 mg by mouth 2 (two) times daily.   [START ON 10/22/2024] tirzepatide (ZEPBOUND) 10 MG/0.5ML Pen Inject 10 mg into the skin once a week. (Patient not taking: Reported on 07/29/2024)   [START ON 11/22/2024] tirzepatide (ZEPBOUND) 12.5 MG/0.5ML Pen Inject 12.5 mg into the skin once a week.  (Patient not taking: Reported on 07/29/2024)   [START ON 12/20/2024] tirzepatide (ZEPBOUND) 15 MG/0.5ML Pen Inject 15 mg into the skin once a week. (Patient not taking: Reported on 07/29/2024)   tirzepatide (ZEPBOUND) 2.5 MG/0.5ML Pen Inject 2.5 mg into the skin once a week. (Patient not taking: Reported on 07/29/2024)   [START ON 08/22/2024] tirzepatide (ZEPBOUND) 5 MG/0.5ML Pen Inject 5 mg into the skin once a week. (Patient not taking: Reported on 07/29/2024)   [START ON 09/21/2024] tirzepatide (ZEPBOUND) 7.5 MG/0.5ML Pen Inject 7.5 mg into the skin once a week. (Patient not taking: Reported on 07/29/2024)   [DISCONTINUED] benzonatate  (TESSALON ) 100 MG capsule Take 1-2 capsules (100-200 mg total) by mouth 3 (three) times daily as needed.   [DISCONTINUED] lidocaine  (XYLOCAINE ) 2 % solution Use as directed 5-10 mLs in the mouth or throat every 4 (four) hours as needed (sore throat). (Patient not taking: Reported on 07/29/2024)   No facility-administered encounter medications on file as of 07/29/2024.    Past Medical History:  Diagnosis Date   Abscess 06/23/2021   Achilles tendonitis 01/16/2014   Overview:   Followed by [employer].  Treated with brace and diclofenac .   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  Follow-up with Dr. Zena.  Watch for opportunities to increase physical activity within neuromuscular capabilities.   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   Transaminitis 06/23/2021   Urinary incontinence 06/03/2010   Vertebral osteomyelitis (HCC)     Past Surgical History:  Procedure Laterality Date   ABDOMINAL HYSTERECTOMY     AORTIC VALVE REPLACEMENT N/A 11/07/2021   Procedure: AORTIC VALVE REPLACEMENT USING A On-X AORTIC VALVE.;  Surgeon: Shyrl Linnie KIDD, MD;  Location: [facility] OR;  Service: Open Heart Surgery;  Laterality: N/A;   BACK SURGERY     BUBBLE STUDY  08/31/2021   Procedure: BUBBLE STUDY;  Surgeon: Hobart Powell BRAVO, MD;  Location: [facility] ENDOSCOPY;  Service: Cardiovascular;;   CARDIAC VALVE REPLACEMENT  11/07/2021   CARPAL TUNNEL RELEASE Bilateral    CHOLECYSTECTOMY     HERNIA REPAIR  INGUINAL HERNIA REPAIR Left 02/29/2024   Procedure: REPAIR, HERNIA, INGUINAL, LAPAROSCOPIC;  Surgeon: Lyndel Deward PARAS, MD;  Location: [facility] ORS;  Service: General;  Laterality: Left;  LATERALITY: LEFT   IR FLUORO GUIDED NEEDLE PLC ASPIRATION/INJECTION LOC  06/23/2021   LITHOTRIPSY     neck fusion     RIGHT/LEFT HEART CATH AND CORONARY ANGIOGRAPHY N/A 10/07/2021   Procedure: RIGHT/LEFT HEART CATH AND CORONARY ANGIOGRAPHY;  Surgeon: Dann Candyce RAMAN, MD;  Location: [facility] INVASIVE CV LAB;  Service: Cardiovascular;  Laterality: N/A;   SPINE SURGERY     TEE WITHOUT CARDIOVERSION N/A 06/28/2021   Procedure: TRANSESOPHAGEAL ECHOCARDIOGRAM (TEE);  Surgeon: Pietro Redell RAMAN, MD;  Location: [facility] ENDOSCOPY;  Service: Cardiovascular;  Laterality: N/A;   TEE WITHOUT CARDIOVERSION N/A 08/31/2021   Procedure: TRANSESOPHAGEAL ECHOCARDIOGRAM (TEE);  Surgeon: Hobart Powell BRAVO, MD;  Location: [facility] ENDOSCOPY;  Service: Cardiovascular;  Laterality: N/A;   TEE WITHOUT CARDIOVERSION  N/A 11/07/2021   Procedure: TRANSESOPHAGEAL ECHOCARDIOGRAM (TEE);  Surgeon: Shyrl Linnie KIDD, MD;  Location: [facility] OR;  Service: Open Heart Surgery;  Laterality: N/A;   TUBAL LIGATION     UMBILICAL HERNIA REPAIR N/A 02/29/2024   Procedure: REPAIR, HERNIA, UMBILICAL, ADULT;  Surgeon: Lyndel Deward PARAS, MD;  Location: [facility] ORS;  Service: General;  Laterality: N/A;    Family History  Problem Relation Age of Onset   Diabetes Mother    Hypertension Mother    Arthritis Mother    Depression Mother    Obesity Mother    Varicose Veins Mother    Cancer Father    Parkinson's disease Father    Heart disease Father    Heart disease Sister    Heart disease Brother    Obesity Brother    Diabetes Maternal Grandmother    Hypertension Sister    Obesity Sister    ADD / ADHD Daughter    ADD / ADHD Son    Colon cancer Neg Hx    Rectal cancer Neg Hx    Stomach cancer Neg Hx     Social History   Socioeconomic History   Marital status: Single    Spouse name: Not on file   Number of children: Not on file   Years of education: Not on file   Highest education level: Associate degree: academic program  Occupational History   Not on file  Tobacco Use   Smoking status: Never    Passive exposure: Never   Smokeless tobacco: Never  Vaping Use   Vaping status: Never Used  Substance and Sexual Activity   Alcohol use: No   Drug use: Never   Sexual activity: Yes    Birth control/protection: None    Comment: With same partner for several years  Other Topics Concern   Not on file  Social History Narrative   Not on file   Social Drivers of Health   Financial Resource Strain: Medium Risk (07/25/2024)   Overall Financial Resource Strain (CARDIA)    Difficulty of Paying Living Expenses: Somewhat hard  Food Insecurity: Food Insecurity Present (07/25/2024)   Hunger Vital Sign    Worried About Running Out of Food in the Last Year: Sometimes true    Ran Out of Food in the Last Year: Sometimes  true  Transportation Needs: Unmet Transportation Needs (07/25/2024)   PRAPARE - Transportation    Lack of Transportation (Medical): Yes    Lack of Transportation (Non-Medical): Yes  Physical Activity: Inactive (07/25/2024)   Exercise Vital Sign    Days  of Exercise per Week: 0 days    Minutes of Exercise per Session: Not on file  Stress: Stress Concern Present (07/25/2024)   [employer] of Occupational Health - Occupational Stress Questionnaire    Feeling of Stress: To some extent  Social Connections: Moderately Integrated (07/25/2024)   Social Connection and Isolation Panel    Frequency of Communication with Friends and Family: More than three times a week    Frequency of Social Gatherings with Friends and Family: More than three times a week    Attends Religious Services: More than 4 times per year    Active Member of [employer] or Organizations: Yes    Attends Engineer, Structural: More than 4 times per year    Marital Status: Divorced  Intimate Partner Violence: Not At Risk (07/02/2023)   Humiliation, Afraid, Rape, and Kick questionnaire    Fear of Current or Ex-Partner: No    Emotionally Abused: No    Physically Abused: No    Sexually Abused: No    ROS Per HPI     Objective    BP (!) 134/90   Pulse 88   Temp 97.8 F (36.6 C) (Temporal)   Ht 5' 7 (1.702 m)   Wt (!) 312 lb (141.5 kg)   SpO2 97%   BMI 48.87 kg/m   Physical Exam Constitutional:      General: She is not in acute distress.    Appearance: She is obese. She is not ill-appearing.  HENT:     Mouth/Throat:     Mouth: Mucous membranes are moist.     Pharynx: Oropharynx is clear.  Eyes:     Extraocular Movements: Extraocular movements intact.     Conjunctiva/sclera: Conjunctivae normal.  Cardiovascular:     Rate and Rhythm: Normal rate and regular rhythm.  Pulmonary:     Effort: Pulmonary effort is normal.     Breath sounds: Normal breath sounds.  Musculoskeletal:     Cervical back:  Normal range of motion and neck supple. No tenderness.  Lymphadenopathy:     Cervical: No cervical adenopathy.  Skin:    General: Skin is warm and dry.  Neurological:     General: No focal deficit present.     Mental Status: She is alert and oriented to person, place, and time.     Comments: Using walker   Psychiatric:        Mood and Affect: Mood normal.        Behavior: Behavior normal.        Thought Content: Thought content normal.         Assessment & Plan:   Problem List Items Addressed This Visit     Anxiety and depression   Gastroesophageal reflux disease   Hypertension, benign   Long term (current) use of anticoagulants   Morbidly obese (HCC)   Prediabetes   S/P AVR (aortic valve replacement)   Vitamin D  deficiency   Other Visit Diagnoses       Dyspareunia in female    -  Primary     Back pain with history of spinal surgery           Assessment and Plan Assessment & Plan Postmenopausal vaginal dryness Experiencing vaginal dryness during intercourse, managed with over-the-counter Estroven and lubricants such as olive oil and a moisturizer in a blue box. No current need for hormone therapy. - Try olive oil as a lubricant during intercourse. - Will referral to a gynecologist for menopausal symptoms  and dyspareunia   Chronic low back pain with history of spinal surgeries Chronic low back pain managed with injections every three months at the pain clinic. History of cervical, thoracic, and lower back surgeries. - Continue receiving injections every three months at the pain clinic.  History of heart valve replacement Heart valve replacement with ongoing management by cardiology. On warfarin and metoprolol  as part of post-surgical care. - Continue warfarin and metoprolol  as prescribed by cardiology.  Hypertension Managed with amlodipine . Monitors blood pressure daily and adjusts medication based on readings. - Continue amlodipine  as prescribed. - Continue  monitoring blood pressure daily.  Hyperlipidemia Managed with atorvastatin . Cholesterol levels monitored by cardiology due to heart valve replacement. - Continue atorvastatin  as prescribed.  Gastroesophageal reflux disease (GERD) GERD managed with pantoprazole . - Continue pantoprazole  as prescribed.  History of hernia repair Hernia repair with mesh placement and removal of a hernia behind the belly button.  Right foot drop Managed with use of a walker or cane for mobility. - Continue using walker or cane for mobility.  Chronic right shoulder pain (history of injury) Chronic right shoulder pain managed by orthopedics. History of a 55 year old injury causing ongoing issues. - Continue management with orthopedics.  Prediabetes Family history of diabetes. No current need for insulin  or other diabetes medications.  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 Reports improvement in symptoms with gabapentin. - Continue gabapentin as prescribed by the pain clinic.  General Health Maintenance Due for a Medicare Wellness visit. Recommended COVID booster. - Will schedule a Medicare Wellness visit in three months. - Recommended COVID booster at a pharmacy.     Return in about 3 months (around 10/29/2024) for Welcome to Medicare Visit (40 minute spot) , fasting CPE.   Boby Mackintosh, NP-C

## 2024-07-30 ENCOUNTER — Encounter

## 2024-07-30 ENCOUNTER — Ambulatory Visit (INDEPENDENT_AMBULATORY_CARE_PROVIDER_SITE_OTHER): Admitting: Orthopaedic Surgery

## 2024-07-30 ENCOUNTER — Ambulatory Visit (INDEPENDENT_AMBULATORY_CARE_PROVIDER_SITE_OTHER)

## 2024-07-30 ENCOUNTER — Other Ambulatory Visit (HOSPITAL_BASED_OUTPATIENT_CLINIC_OR_DEPARTMENT_OTHER): Payer: Self-pay

## 2024-07-30 ENCOUNTER — Ambulatory Visit: Attending: Cardiology | Admitting: *Deleted

## 2024-07-30 DIAGNOSIS — Z952 Presence of prosthetic heart valve: Secondary | ICD-10-CM | POA: Diagnosis not present

## 2024-07-30 DIAGNOSIS — G8929 Other chronic pain: Secondary | ICD-10-CM

## 2024-07-30 DIAGNOSIS — M25511 Pain in right shoulder: Secondary | ICD-10-CM

## 2024-07-30 DIAGNOSIS — Z7901 Long term (current) use of anticoagulants: Secondary | ICD-10-CM

## 2024-07-30 DIAGNOSIS — I351 Nonrheumatic aortic (valve) insufficiency: Secondary | ICD-10-CM | POA: Diagnosis not present

## 2024-07-30 DIAGNOSIS — M25562 Pain in left knee: Secondary | ICD-10-CM

## 2024-07-30 DIAGNOSIS — M19011 Primary osteoarthritis, right shoulder: Secondary | ICD-10-CM | POA: Diagnosis not present

## 2024-07-30 DIAGNOSIS — M1712 Unilateral primary osteoarthritis, left knee: Secondary | ICD-10-CM | POA: Diagnosis not present

## 2024-07-30 LAB — POCT INR: INR: 2.2 (ref 2.0–3.0)

## 2024-07-30 MED ORDER — LORAZEPAM 1 MG PO TABS
1.0000 mg | ORAL_TABLET | Freq: Three times a day (TID) | ORAL | 0 refills | Status: AC
  Filled 2024-07-30: qty 2, 1d supply, fill #0

## 2024-07-30 MED ORDER — TRIAMCINOLONE ACETONIDE 40 MG/ML IJ SUSP
80.0000 mg | INTRAMUSCULAR | Status: AC | PRN
  Administered 2024-07-30: 80 mg via INTRA_ARTICULAR

## 2024-07-30 MED ORDER — LIDOCAINE HCL 1 % IJ SOLN
4.0000 mL | INTRAMUSCULAR | Status: AC | PRN
  Administered 2024-07-30: 4 mL

## 2024-07-30 NOTE — Progress Notes (Signed)
 Description   INR-2.2; Start taking 1 tablet daily, except 1/2 tablet every Monday and Friday.  INR in 3 weeks. [phone]

## 2024-07-30 NOTE — Progress Notes (Signed)
 Chief Complaint: Right shoulder, left knee pain     History of Present Illness:    Mary Chambers is a 53 y.o. female presents today with ongoing right shoulder as well as left knee pain.  She does have a distant history of rotator cuff surgery which she was unable to get addressed at the time as she did have young children.  She has been have persistent pain with overhead activity as well as range of motion.  This does bother her with basic activities of daily living.  With regard to the left knee she does have a history of left knee patellar osteoarthritis for which she has had previous injections nearly a decade prior which gave her significantly good relief    PMH/PSH/Family History/Social History/Meds/Allergies:    Past Medical History:  Diagnosis Date  . Abscess 06/23/2021  . Achilles tendonitis 01/16/2014   Overview:   Followed by [employer].  Treated with brace and diclofenac .  . Allergy    In my chart  . Anemia   . Anxiety   . Aortic regurgitation    severe  . 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 Arthritis   . Arthrodesis status 02/02/2016  . Bicuspid aortic valve 07/27/2021  . Carpal tunnel syndrome of right wrist 12/14/2014  . Cervical disc disease 09/09/2009   Last Assessment & Plan:   Overweight MR imaging and nerve conduction study.  Follow-up with Dr. Zena.  Watch for opportunities to increase physical activity within neuromuscular capabilities.  . Chronic back pain 02/13/2018  . Chronic pain syndrome 02/02/2016  . Closed fracture of sacrum with routine healing 12/22/2021  . Clotting disorder   . COVID-19 virus infection 06/23/2021  . Depression   . GERD (gastroesophageal reflux disease)   . Herniated lumbar disc without myelopathy 10/02/2019  . History of bacterial endocarditis 07/05/2022  . History of kidney stones   . Hypertension   . Insomnia 12/08/2021  . Obesity   . Pneumonia   . Sleep apnea 06/25/2018  .  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 Transaminitis 06/23/2021  . Urinary incontinence 06/03/2010  . Vertebral osteomyelitis [facility])    Past Surgical History:  Procedure Laterality Date  . ABDOMINAL HYSTERECTOMY    . AORTIC VALVE REPLACEMENT N/A 11/07/2021   Procedure: AORTIC VALVE REPLACEMENT USING A On-X AORTIC VALVE.;  Surgeon: Shyrl Linnie KIDD, MD;  Location: [facility] OR;  Service: Open Heart Surgery;  Laterality: N/A;  . BACK SURGERY    . BUBBLE STUDY  08/31/2021   Procedure: BUBBLE STUDY;  Surgeon: Hobart Powell BRAVO, MD;  Location: [facility] ENDOSCOPY;  Service: Cardiovascular;;  . CARDIAC VALVE REPLACEMENT  11/07/2021  . CARPAL TUNNEL RELEASE Bilateral   . CHOLECYSTECTOMY    . HERNIA REPAIR    . INGUINAL HERNIA REPAIR Left 02/29/2024   Procedure: REPAIR, HERNIA, INGUINAL, LAPAROSCOPIC;  Surgeon: Lyndel Deward PARAS, MD;  Location: [facility] ORS;  Service: General;  Laterality: Left;  LATERALITY: LEFT  . IR FLUORO GUIDED NEEDLE PLC ASPIRATION/INJECTION LOC  06/23/2021  . LITHOTRIPSY    . neck fusion    . RIGHT/LEFT HEART CATH AND CORONARY ANGIOGRAPHY N/A 10/07/2021   Procedure: RIGHT/LEFT HEART CATH AND CORONARY ANGIOGRAPHY;  Surgeon: Dann Candyce RAMAN, MD;  Location: [facility] INVASIVE CV LAB;  Service: Cardiovascular;  Laterality: N/A;  . SPINE SURGERY    . TEE WITHOUT CARDIOVERSION N/A 06/28/2021   Procedure: TRANSESOPHAGEAL ECHOCARDIOGRAM (TEE);  Surgeon: Pietro Redell RAMAN, MD;  Location: [facility]  ENDOSCOPY;  Service: Cardiovascular;  Laterality: N/A;  . TEE WITHOUT CARDIOVERSION N/A 08/31/2021   Procedure: TRANSESOPHAGEAL ECHOCARDIOGRAM (TEE);  Surgeon: Hobart Powell BRAVO, MD;  Location: [facility] ENDOSCOPY;  Service: Cardiovascular;  Laterality: N/A;  . TEE WITHOUT CARDIOVERSION N/A 11/07/2021   Procedure: TRANSESOPHAGEAL ECHOCARDIOGRAM (TEE);  Surgeon: Shyrl Linnie KIDD, MD;  Location: [facility] OR;  Service: Open Heart Surgery;  Laterality: N/A;  . TUBAL LIGATION    . UMBILICAL HERNIA REPAIR N/A  02/29/2024   Procedure: REPAIR, HERNIA, UMBILICAL, ADULT;  Surgeon: Lyndel Deward PARAS, MD;  Location: [facility] ORS;  Service: General;  Laterality: N/A;   Social History   Socioeconomic History  . Marital status: Single    Spouse name: Not on file  . Number of children: Not on file  . Years of education: Not on file  . Highest education level: Associate degree: academic program  Occupational History  . Not on file  Tobacco Use  . Smoking status: Never    Passive exposure: Never  . Smokeless tobacco: Never  Vaping Use  . Vaping status: Never Used  Substance and Sexual Activity  . Alcohol use: No  . Drug use: Never  . Sexual activity: Yes    Birth control/protection: None    Comment: With same partner for several years  Other Topics Concern  . Not on file  Social History Narrative  . Not on file   Social Drivers of Health   Financial Resource Strain: Medium Risk (07/25/2024)   Overall Financial Resource Strain (CARDIA)   . Difficulty of Paying Living Expenses: Somewhat hard  Food Insecurity: Food Insecurity Present (07/25/2024)   Hunger Vital Sign   . Worried About Programme Researcher, Broadcasting/film/video in the Last Year: Sometimes true   . Ran Out of Food in the Last Year: Sometimes true  Transportation Needs: Unmet Transportation Needs (07/25/2024)   PRAPARE - Transportation   . Lack of Transportation (Medical): Yes   . Lack of Transportation (Non-Medical): Yes  Physical Activity: Inactive (07/25/2024)   Exercise Vital Sign   . Days of Exercise per Week: 0 days   . Minutes of Exercise per Session: Not on file  Stress: Stress Concern Present (07/25/2024)   [employer] of Occupational Health - Occupational Stress Questionnaire   . Feeling of Stress: To some extent  Social Connections: Moderately Integrated (07/25/2024)   Social Connection and Isolation Panel   . Frequency of Communication with Friends and Family: More than three times a week   . Frequency of Social Gatherings with  Friends and Family: More than three times a week   . Attends Religious Services: More than 4 times per year   . Active Member of Clubs or Organizations: Yes   . Attends Banker Meetings: More than 4 times per year   . Marital Status: Divorced   Family History  Problem Relation Age of Onset  . Diabetes Mother   . Hypertension Mother   . Arthritis Mother   . Depression Mother   . Obesity Mother   . Varicose Veins Mother   . Cancer Father   . Parkinson's disease Father   . Heart disease Father   . Heart disease Sister   . Heart disease Brother   . Obesity Brother   . Diabetes Maternal Grandmother   . Hypertension Sister   . Obesity Sister   . ADD / ADHD Daughter   . ADD / ADHD Son   . Colon cancer Neg Hx   . Rectal  cancer Neg Hx   . Stomach cancer Neg Hx    Allergies  Allergen Reactions  . Sulfur Itching  . Baclofen Hives  . Dilaudid  [Hydromorphone ] Itching  . Latex     When inside the body it causes irritation  Other Reaction(s): Not available  latex  . Oxycodone -Acetaminophen  Hives  . Semaglutide  Nausea And Vomiting  . Septra [Sulfamethoxazole-Trimethoprim] Hives  . Vibramycin [Doxycycline] Itching   Current Outpatient Medications  Medication Sig Dispense Refill  . LORazepam  (ATIVAN ) 1 MG tablet Take 1 tablet (1 mg total) by mouth every 8 (eight) hours. 2 tablet 0  . amLODipine  (NORVASC ) 2.5 MG tablet TAKE 1 TABLET BY MOUTH EVERY DAY 90 tablet 0  . amoxicillin  (AMOXIL ) 500 MG capsule TAKE 4 CAPSULES (2,000 MG TOTAL) BY MOUTH ONCE FOR 1 DOSE. PATIENT TO TAKE 4 TABLETS PRIOR TO DENTAL WORK. (Patient not taking: Reported on 07/29/2024) 4 capsule 10  . aspirin  EC 81 MG tablet Take 1 tablet (81 mg total) by mouth daily. Swallow whole. 30 tablet 11  . atorvastatin  (LIPITOR) 10 MG tablet TAKE 1 TABLET BY MOUTH EVERY DAY IN THE EVENING 90 tablet 3  . ELDERBERRY PO Take 50 mg by mouth daily.    . escitalopram  (LEXAPRO ) 20 MG tablet TAKE 1 TABLET BY MOUTH EVERY  DAY 90 tablet 1  . fluticasone  (FLONASE ) 50 MCG/ACT nasal spray SPRAY 2 SPRAYS INTO EACH NOSTRIL EVERY DAY 16 mL 6  . furosemide  (LASIX ) 20 MG tablet Take 1 tablet (20 mg total) by mouth daily. 90 tablet 3  . gabapentin (NEURONTIN) 100 MG capsule Take 100 mg by mouth 3 (three) times daily.    . loperamide  (IMODIUM ) 2 MG capsule Take 1 capsule (2 mg total) by mouth as needed for diarrhea or loose stools. 30 capsule 0  . Magnesium  250 MG CAPS Take 250 mg by mouth daily as needed (feet burning).    . metoprolol  tartrate (LOPRESSOR ) 25 MG tablet Take 0.5 tablets (12.5 mg total) by mouth 2 (two) times daily. 90 tablet 3  . Multiple Vitamin (MULTIVITAMIN WITH MINERALS) TABS tablet Take 2 tablets by mouth in the morning.    . naproxen  (NAPROSYN ) 250 MG tablet Take 250 mg by mouth 2 (two) times daily.    . pantoprazole  (PROTONIX ) 40 MG tablet TAKE 1 TABLET BY MOUTH EVERY DAY 90 tablet 3  . Potassium 99 MG TABS Take 198 mg by mouth daily as needed (when taking lasix ).    SABRA CASTLEMAN ON 10/22/2024] tirzepatide (ZEPBOUND) 10 MG/0.5ML Pen Inject 10 mg into the skin once a week. (Patient not taking: Reported on 07/29/2024) 2 mL 0  . [START ON 11/22/2024] tirzepatide (ZEPBOUND) 12.5 MG/0.5ML Pen Inject 12.5 mg into the skin once a week. (Patient not taking: Reported on 07/29/2024) 2 mL 0  . [START ON 12/20/2024] tirzepatide (ZEPBOUND) 15 MG/0.5ML Pen Inject 15 mg into the skin once a week. (Patient not taking: Reported on 07/29/2024) 2 mL 0  . tirzepatide (ZEPBOUND) 2.5 MG/0.5ML Pen Inject 2.5 mg into the skin once a week. (Patient not taking: Reported on 07/29/2024) 2 mL 0  . [START ON 08/22/2024] tirzepatide (ZEPBOUND) 5 MG/0.5ML Pen Inject 5 mg into the skin once a week. (Patient not taking: Reported on 07/29/2024) 2 mL 0  . [START ON 09/21/2024] tirzepatide (ZEPBOUND) 7.5 MG/0.5ML Pen Inject 7.5 mg into the skin once a week. (Patient not taking: Reported on 07/29/2024) 2 mL 0  . tiZANidine  (ZANAFLEX ) 4 MG tablet  Take 4 mg by  mouth at bedtime.    . vitamin C  (ASCORBIC ACID ) 250 MG tablet Take 500 mg by mouth daily.    SABRA warfarin (COUMADIN ) 5 MG tablet TAKE 1/2 TO 1 TABLET DAILY OR AS DIRECTED BY COUMADIN  CLINIC 90 tablet 1   No current facility-administered medications for this visit.   No results found.  Review of Systems:   A ROS was performed including pertinent positives and negatives as documented in the HPI.  Physical Exam :   Constitutional: NAD and appears stated age Neurological: Alert and oriented Psych: Appropriate affect and cooperative There were no vitals taken for this visit.   Comprehensive Musculoskeletal Exam:    Left knee with tenderness about the patellofemoral joint range of motion is from 0 to 100 degrees with crepitus neurosensory exam is intact  Right shoulder overhead range of motion is to 150 degrees external rotation at the side is 50 degrees internal rotation is L1 there is pain with forward elevation and a positive Neer impingement   Imaging:   Xray (3 views right shoulder, 4 views left knee): Advanced left knee patellofemoral osteoarthritis, right knee Superior humeral head migration consistent with rotator cuff arthropathy     I personally reviewed and interpreted the radiographs.   Assessment and Plan:   53 y.o. female with evidence of right shoulder rotator cuff arthropathy in the setting of a nonpreviously treated rotator cuff tendon tear.  At this time I have advised I would like to obtain an MRI of the right shoulder so we can see if there is been progression of this tearing.  With regard to the left knee she I do believe would benefit from a left knee injection for which she would like to proceed with  -Left knee ultrasound-guided injection provided after verbal consent obtained    Procedure Note  Patient: HAYZLEE MCSORLEY             Date of Birth: [birth year]           MRN: [id number]             Visit Date: 07/30/2024  Procedures: Visit  Diagnoses:  1. Chronic right shoulder pain   2. Chronic pain of left knee     Large Joint Inj: L knee on 07/30/2024 5:15 PM Indications: pain Details: 22 G 1.5 in needle, ultrasound-guided anterior approach  Arthrogram: No  Medications: 4 mL lidocaine  1 %; 80 mg triamcinolone  acetonide 40 MG/ML Outcome: tolerated well, no immediate complications Procedure, treatment alternatives, risks and benefits explained, specific risks discussed. Consent was given by the patient. Immediately prior to procedure a time out was called to verify the correct patient, procedure, equipment, support staff and site/side marked as required. Patient was prepped and draped in the usual sterile fashion.         I personally saw and evaluated the patient, and participated in the management and treatment plan.  Elspeth Parker, MD Attending Physician, Orthopedic Surgery  This document was dictated using Dragon voice recognition software. A reasonable attempt at proof reading has been made to minimize errors.

## 2024-07-30 NOTE — Patient Instructions (Signed)
 Description   INR-2.2; Start taking 1 tablet daily, except 1/2 tablet every Monday and Friday.  INR in 3 weeks. [phone]

## 2024-08-02 DIAGNOSIS — G4733 Obstructive sleep apnea (adult) (pediatric): Secondary | ICD-10-CM | POA: Diagnosis not present

## 2024-08-04 ENCOUNTER — Encounter: Payer: Self-pay | Admitting: Radiology

## 2024-08-04 ENCOUNTER — Ambulatory Visit: Admitting: Physician Assistant

## 2024-08-08 ENCOUNTER — Ambulatory Visit: Payer: Self-pay

## 2024-08-08 ENCOUNTER — Ambulatory Visit: Attending: Podiatry

## 2024-08-08 DIAGNOSIS — M6281 Muscle weakness (generalized): Secondary | ICD-10-CM | POA: Diagnosis present

## 2024-08-08 DIAGNOSIS — R262 Difficulty in walking, not elsewhere classified: Secondary | ICD-10-CM | POA: Diagnosis present

## 2024-08-08 DIAGNOSIS — M25571 Pain in right ankle and joints of right foot: Secondary | ICD-10-CM | POA: Diagnosis present

## 2024-08-08 NOTE — Therapy (Signed)
 OUTPATIENT PHYSICAL THERAPY TREATMENT   Patient Name: BETTINA WARN MRN: [id number] DOB:01/08/1971, 53 y.o., female Today's Date: 08/08/2024  END OF SESSION:  PT End of Session - 08/08/24 0734     Visit Number 3    Number of Visits 20    Date for Recertification  10/03/24    PT Start Time 0734    PT Stop Time 0817    PT Time Calculation (min) 43 min    Activity Tolerance Patient tolerated treatment well    Behavior During Therapy [facility] for tasks assessed/performed            Past Medical History:  Diagnosis Date   Abscess 06/23/2021   Achilles tendonitis 01/16/2014   Overview:   Followed by [employer].  Treated with brace and diclofenac .   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  Follow-up with Dr. Zena.  Watch for opportunities to increase physical activity within neuromuscular capabilities.   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   Transaminitis 06/23/2021   Urinary incontinence 06/03/2010   Vertebral osteomyelitis (HCC)    Past Surgical History:  Procedure Laterality Date   ABDOMINAL HYSTERECTOMY     AORTIC VALVE REPLACEMENT N/A 11/07/2021   Procedure: AORTIC  VALVE REPLACEMENT USING A On-X AORTIC VALVE.;  Surgeon: Shyrl Linnie KIDD, MD;  Location: [facility] OR;  Service: Open Heart Surgery;  Laterality: N/A;   BACK SURGERY     BUBBLE STUDY  08/31/2021   Procedure: BUBBLE STUDY;  Surgeon: Hobart Powell BRAVO, MD;  Location: [facility] ENDOSCOPY;  Service: Cardiovascular;;   CARDIAC VALVE REPLACEMENT  11/07/2021   CARPAL TUNNEL RELEASE Bilateral    CHOLECYSTECTOMY     HERNIA REPAIR     INGUINAL HERNIA REPAIR Left 02/29/2024   Procedure: REPAIR, HERNIA, INGUINAL, LAPAROSCOPIC;  Surgeon: Lyndel Deward PARAS, MD;  Location: [facility] ORS;  Service: General;  Laterality: Left;  LATERALITY: LEFT   IR FLUORO GUIDED NEEDLE PLC ASPIRATION/INJECTION LOC  06/23/2021   LITHOTRIPSY     neck fusion     RIGHT/LEFT HEART CATH AND CORONARY ANGIOGRAPHY N/A 10/07/2021   Procedure: RIGHT/LEFT HEART CATH AND CORONARY ANGIOGRAPHY;  Surgeon: Dann Candyce RAMAN, MD;  Location: [facility] INVASIVE CV LAB;  Service: Cardiovascular;  Laterality: N/A;   SPINE SURGERY     TEE WITHOUT CARDIOVERSION N/A 06/28/2021   Procedure: TRANSESOPHAGEAL ECHOCARDIOGRAM (TEE);  Surgeon: Pietro Redell RAMAN, MD;  Location: [facility] ENDOSCOPY;  Service: Cardiovascular;  Laterality: N/A;   TEE WITHOUT CARDIOVERSION N/A 08/31/2021   Procedure: TRANSESOPHAGEAL ECHOCARDIOGRAM (TEE);  Surgeon: Hobart Powell BRAVO, MD;  Location: [facility] ENDOSCOPY;  Service: Cardiovascular;  Laterality: N/A;   TEE WITHOUT CARDIOVERSION N/A 11/07/2021   Procedure: TRANSESOPHAGEAL ECHOCARDIOGRAM (TEE);  Surgeon: Shyrl Linnie KIDD, MD;  Location: [facility] OR;  Service: Open Heart Surgery;  Laterality: N/A;   TUBAL LIGATION     UMBILICAL HERNIA REPAIR N/A 02/29/2024   Procedure: REPAIR, HERNIA, UMBILICAL, ADULT;  Surgeon: Lyndel Deward PARAS, MD;  Location: [facility] ORS;  Service: General;  Laterality: N/A;   Patient Active Problem List   Diagnosis Date Noted   Atrial fibrillation (HCC) 07/02/2024   Acute venous embolism and thrombosis of deep vessels  of proximal lower extremity (HCC) 07/02/2024   History of hernia surgery 03/06/2024   Sprain of ulnar collateral ligament of metacarpophalangeal (MCP) joint of thumb 12/09/2023   Impaired ambulation 11/30/2023   Hypomagnesemia 11/30/2023   Hyperreflexia 07/02/2023   Iron  deficiency anemia 03/29/2023   Vitamin D  deficiency 03/29/2023   Pyelonephritis 03/23/2023   Acute pyelonephritis 03/22/2023   E coli bacteremia 03/22/2023   Wellness examination 07/27/2022   Lumbar pseudoarthrosis 12/22/2021   Idiopathic scoliosis and kyphoscoliosis 12/22/2021   Prediabetes 12/08/2021   Long term (current) use of anticoagulants 11/17/2021   S/P AVR (aortic valve replacement) 11/07/2021   Severe aortic regurgitation 07/27/2021   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   Septic arthritis (HCC) 06/23/2021   Gastroesophageal reflux disease 12/15/2020   Screening for colorectal cancer 12/15/2020   Nausea and vomiting 12/15/2020   History of gastric ulcer 12/15/2020   Thoracic aortic aneurysm 05/12/2020   Lumbar spinal stenosis 10/01/2019   Nephrolithiasis 07/29/2019   Prepyloric ulcer 07/17/2018   Sleep apnea 06/25/2018   Pain in left foot 03/29/2018   Migraines 02/13/2018   Morbidly obese (HCC) 02/13/2018   Synovial cyst of lumbar spine 10/15/2017   Osteoarthritis of knee 07/13/2017   Lumbar disc disease with radiculopathy 08/01/2016   Fatigue 05/04/2016   Headache 05/04/2016   Numbness of foot 05/04/2016   Lumbar stenosis with neurogenic claudication 05/07/2015   Bilateral leg edema 04/21/2015   Renal atrophy, left 01/23/2014   Right lower quadrant pain 12/18/2013   Hypertension, benign 06/03/2010   Urinary incontinence 06/03/2010   Anxiety and depression 10/08/2007    PCP: de [place], Raymond J, MD   REFERRING PROVIDER:    Silva Juliene SAUNDERS, DPM    REFERRING DIAG: [id number] (ICD-10-CM) - Gastrocnemius equinus of right lower extremity M72.2 (ICD-10-CM) - Plantar fasciitis of right  foot  THERAPY DIAG:  Pain in right ankle and joints of right foot - Plan: PT plan of care cert/re-cert  Difficulty in walking, not elsewhere classified - Plan: PT plan of care cert/re-cert  Muscle weakness (generalized) - Plan: PT plan of care cert/re-cert  Rationale for Evaluation and Treatment: Rehabilitation  ONSET DATE:  DOS 05/02/2024  SUBJECTIVE:   SUBJECTIVE STATEMENT: Started using her SPC yesterday. No pain in R foot and ankle.     PERTINENT HISTORY: S/P plantar fascia and gastroc release on 05/02/24. Pt had a heel spur and real bad plantar facitis. Pt calf muscle was also lengthened due to tight calf muscle. Better able to DF her R foot after her surgery. PLOF: ambulation with SPC. Currently use a wc and walks with rw in the house and to get to and from her car. R heel has started hurting a little bit recently. Walks with rw in her house without her boot and her foot and heel are fine. Has to put antibiotic cream at her R inferior medial heel, surgeon aware.  Boot removal on  06/11/2024 at her doctor's appointment.      No latex band allergies per pt.  Blood pressure is controlled per pt.   PAIN:  Are you having pain? Yes: NPRS scale: 0/10 currently (R CAM boot on, pt sitting on wc) Pain location: R inferior medial heel Pain description: achy Aggravating factors:   Relieving factors: elevated, cold compress.   PRECAUTIONS: WBAT with boot  RED FLAGS: Bowel or bladder incontinence: No and Cauda equina syndrome: No   WEIGHT BEARING RESTRICTIONS: WBAT with CAM boot on R  FALLS:  Has patient fallen in last 6 months? No  LIVING ENVIRONMENT: Lives with: lives with their son and female roommate Lives in: House/apartment Stairs: Yes: External: 3 steps; none Has following equipment at home: Single point cane, Walker - 2 wheeled, Wheelchair (manual), shower chair, Grab bars, and raised toilet seat  OCCUPATION: disabled since her heart valve surgery February 2023.    PLOF: MOD I with SPC  PATIENT GOALS: Get her strength back to be able to walk without her rw.   NEXT MD VISIT: 06/11/2024  OBJECTIVE:  Note: Objective measures were completed at Evaluation unless otherwise noted.  DIAGNOSTIC FINDINGS:   PATIENT SURVEYS:  LEFS  Extreme difficulty/unable (0), Quite a bit of difficulty (1), Moderate difficulty (2), Little difficulty (3), No difficulty (4) Survey date:  06/03/2024  Any of your usual work, housework or school activities 1  2. Usual hobbies, recreational or sporting activities 1  3. Getting into/out of the bath 3  4. Walking between rooms 3  5. Putting on socks/shoes 3  6. Squatting  0  7. Lifting an object, like a bag of groceries from the floor 0  8. Performing light activities around your home 2  9. Performing heavy activities around your home 0  10. Getting into/out of a car 3  11. Walking 2 blocks 0  12. Walking 1 mile 0  13. Going up/down 10 stairs (1 flight) 2  14. Standing for 1 hour 0  15.  sitting for 1 hour 3  16. Running on even ground 0  17. Running on uneven ground 0  18. Making sharp turns while running fast 0  19. Hopping  0  20. Rolling over in bed 3  Score total:  24/80     Extreme difficulty/unable (0), Quite a bit of difficulty (1), Moderate difficulty (2), Little difficulty (3), No difficulty (4)  Survey date:  08/08/2024  Any of your usual work, housework or school activities 3  2. Usual hobbies, recreational or sporting activities 3  3. Getting into/out of the bath 3  4. Walking between rooms 3  5. Putting on socks/shoes 4  6. Squatting  1  7. Lifting an object, like a bag of groceries from the floor 3  8. Performing light activities around your home 3  9. Performing heavy activities around your home 0  10. Getting into/out of a car 3  11. Walking 2 blocks 0  12. Walking 1 mile 0  13. Going up/down 10 stairs (1 flight) 1  14. Standing for 1 hour 1  15.  sitting for 1 hour 4  16. Running on even  ground 0  17. Running on uneven ground 0  18. Making sharp turns while running fast 0  19. Hopping  0  20. Rolling over in bed 3  Score total:  35    COGNITION: Overall cognitive status: Within functional limits for tasks assessed     SENSATION:  EDEMA:  Swelling R leg, ankle and foot   MUSCLE LENGTH:  POSTURE:   PALPATION: TTP R posterior medial heel  Swelling R leg, ankle, foot  LOWER EXTREMITY ROM:  Active ROM Right eval Left eval  Hip flexion    Hip extension    Hip abduction    Hip adduction    Hip internal rotation    Hip external rotation    Knee flexion    Knee extension    Ankle dorsiflexion -8 (5 degrees AAROM)   Ankle plantarflexion 53   Ankle inversion 16   Ankle eversion 20    (Blank rows = not tested)  LOWER EXTREMITY MMT:  MMT Right eval Left eval  Hip flexion 4 4-  Hip extension (seated manually resisted) 4- 3+  Hip abduction (seated manually resisted) 4- 4-  Hip adduction    Hip internal rotation    Hip external rotation    Knee flexion 3+ 4  Knee extension 4- 4  Ankle dorsiflexion (supine manually resisted) 4   Ankle plantarflexion (supine manually resisted) 3+   Ankle inversion (supine manually resisted) 3+   Ankle eversion (supine manually resisted) 4-    (Blank rows = not tested)  LOWER EXTREMITY SPECIAL TESTS:    FUNCTIONAL TESTS:    GAIT: Distance walked: 60 ft Assistive device utilized: Walker - 2 wheeled Level of assistance: CGA Comments: with CAM boot on. Altalgic, decreased stance R LE with L pelvic drop during R LE stance phase.                                                                                                                                 TREATMENT DATE: 08/08/2024    Therapeutic exercise   Seated manually resisted R hip flexion, hip extension, hip abduction, knee flexion, knee extension,  Reclined ankle DF, PF, EV, PF 1x each way.  Reviewed progress/current status with PT towards  goals  Reclined   Ankle AROM   DF, IV, EV    R gastroc stretch with strap 30 seconds x 3   Ankle T-band red    DF 10x 3   PF 10x3   EV 10x3   IV 10x  (difficult)   S/L hip abduction R 2x. Difficult  S/L clamshell  R 10x3  L 10x3   Improved exercise technique, movement at target joints, use of target muscles after mod verbal, visual, tactile cues.     PATIENT EDUCATION:  Education details: there-ex, HEP, POC Person educated: Patient Education method: Explanation, Demonstration, Tactile cues, Verbal cues, and Handouts Education comprehension: verbalized understanding and returned demonstration  HOME EXERCISE PROGRAM: Access Code: [email or web address] URL: [email or web address] Date: 08/08/2024 Prepared by: Emil Glassman  Exercises - Supine Ankle Pumps  - 5 x daily - 7 x weekly - 3 sets - 10 reps - Seated Toe Curl  - 2 x daily - 7 x weekly - 3 sets - 10 reps - 5  seconds hold - Long Sitting Calf Stretch with Strap  - 3 x daily - 7 x weekly - 1 sets - 3 reps - 30 seconds to 1 minute  hold - Ankle Dorsiflexion with Resistance  - 1 x daily - 7 x weekly - 2-3 sets - 10 reps - Long Sitting Ankle Plantar Flexion with Resistance  - 1 x daily - 7 x weekly - 2-3 sets - 10 reps - Long Sitting Ankle Eversion with Resistance  - 1 x daily - 7 x weekly - 2-3 sets - 10 reps - Long Sitting Ankle Inversion with Resistance  - 1 x daily - 7 x weekly - 2-3 sets - 10 reps - Supine Active Straight Leg Raise  - 1 x daily - 7 x weekly - 2 sets - 5 reps - Clamshell  - 1 x daily - 7 x weekly - 3 sets - 10 reps  ASSESSMENT:  CLINICAL IMPRESSION: Pt currently demonstrates improving R LE strength, function, and currently able to ambulate with regular shoe and SPC for short distances. Pt making progress with PT towards goals. Worked on improving R ankle and hip strength to promote ability to perform transfers, standing tasks, and ambulate with less difficulty. Pt tolerated session well without  aggravation of symptoms. Pt will benefit from continued skilled physical therapy services to improve strength, balance, function, and ability to ambulate with less difficulty.      OBJECTIVE IMPAIRMENTS: Abnormal gait, decreased activity tolerance, decreased balance, difficulty walking, decreased ROM, decreased strength, improper body mechanics, postural dysfunction, and pain.   ACTIVITY LIMITATIONS: carrying, lifting, bending, standing, squatting, stairs, transfers, and locomotion level  PARTICIPATION LIMITATIONS:   PERSONAL FACTORS: Age, Fitness, Past/current experiences, Time since onset of injury/illness/exacerbation, and 3+ comorbidities: Aortic regurgitation, aortic valve replacement, arthritis, back surgery, obesity are also affecting patient's functional outcome.   REHAB POTENTIAL: Fair    CLINICAL DECISION MAKING: Stable/uncomplicated  EVALUATION COMPLEXITY: Low   GOALS: Goals reviewed with patient? Yes  SHORT TERM GOALS: Target date: 06/13/2024 Pt will be independent with her initial HEP to improve ROM, strength, function, and ability to ambulate with less difficulty.  Baseline: Pt has started her initial HEP (06/03/2024); Has been doing them, no questions (08/08/2024) Goal status: MET    LONG TERM GOALS: Target date: 10/03/2024  Pt will be able to ambulate at least 500 ft without CAM boot and with SPC on L side to promote mobility.  Baseline: rw with CAM boot, short distances of about 60 ft at a time (06/03/2024); Ambulatino with regular shoe and SPC on L, short distances (08/08/2024) Goal status: PROGRESSING  2.  Pt will improve R LE strength by at least 1/2 MMT grade to promote ability to ambulate, perform transfers, standing tasks, and negotiate stairs with less difficulty.  Baseline:  MMT Right eval Left eval R 08/08/2024  Hip flexion 4 4- 4  Hip extension (seated manually resisted) 4- 3+ 4  Hip abduction (seated manually resisted) 4- 4- 4-  Hip adduction     Hip  internal rotation     Hip external rotation     Knee flexion 3+ 4 5  Knee extension 4- 4 4  Ankle dorsiflexion (supine manually resisted) 4  3+  Ankle plantarflexion (supine manually resisted) 3+  4-  Ankle inversion (supine manually resisted) 3+  3+  Ankle eversion (supine manually resisted) 4-  44   Ankle EV 4- R on 08/08/2024   Goal status: PROGRESSING  3.  Pt will improve  her LEFS score by at least 20 points as a demonstration of improved function.  Baseline: 24/80 (9/2/025); 35 (08/08/2024) Goal status: PROGRESSING  4.  Pt will improve R ankle DF AROM by at least 15 degrees and R ankle EV and IV by at least 10 degrees to promote ability to ambulate and perform standing tasks with less difficulty.  Baseline:  Active ROM Right eval R 08/08/2024  Ankle dorsiflexion -8 (5 degrees AAROM) -11  Ankle plantarflexion 53   Ankle inversion 16 12  Ankle eversion 20 20   Goal status: ONGOING    PLAN:  PT FREQUENCY: 1-2x/week  PT DURATION: 8 weeks  PLANNED INTERVENTIONS: 97110-Therapeutic exercises, 97530- Therapeutic activity, 97112- Neuromuscular re-education, 97535- Self Care, 02859- Manual therapy, [phone]- Gait training, [phone]- Aquatic Therapy, [phone]- Electrical stimulation (unattended), [phone]- Ionotophoresis 4mg /ml Dexamethasone , Patient/Family education, Balance training, Stair training, and Joint mobilization  PLAN FOR NEXT SESSION: trunk, hip, knee, ankle strengthening, R ankle ROM, gait, manual techniques, modalities PRN   Salem Lembke, PT, DPT 08/08/2024, 9:16 AM

## 2024-08-12 ENCOUNTER — Inpatient Hospital Stay: Admission: RE | Admit: 2024-08-12 | Source: Ambulatory Visit

## 2024-08-14 ENCOUNTER — Telehealth: Admitting: Physician Assistant

## 2024-08-14 DIAGNOSIS — M546 Pain in thoracic spine: Secondary | ICD-10-CM | POA: Diagnosis not present

## 2024-08-14 MED ORDER — TIZANIDINE HCL 2 MG PO TABS: 2.0000 mg | ORAL_TABLET | Freq: Three times a day (TID) | ORAL | 0 refills | Status: DC | PRN

## 2024-08-14 NOTE — Progress Notes (Signed)
 Message sent to patient requesting further input regarding current symptoms. Awaiting patient response.

## 2024-08-14 NOTE — Progress Notes (Signed)
 We are sorry that you are not feeling well.  Here is how we plan to help!  Based on what you have shared with me it looks like you mostly have acute back pain.  Acute back pain is defined as musculoskeletal pain that can resolve in 1-3 weeks with conservative treatment.  I have prescribed  Tizanidine  2 mg every eight hours as needed which is a muscle relaxer  Some patients experience stomach irritation or in increased heartburn with anti-inflammatory drugs.  Please keep in mind that muscle relaxer's can cause fatigue and should not be taken while at work or driving.  Back pain is very common.  The pain often gets better over time.  The cause of back pain is usually not dangerous.  Most people can learn to manage their back pain on their own.  Home Care Stay active.  Start with short walks on flat ground if you can.  Try to walk farther each day. Do not sit, drive or stand in one place for more than 30 minutes.  Do not stay in bed. Do not avoid exercise or work.  Activity can help your back heal faster. Be careful when you bend or lift an object.  Bend at your knees, keep the object close to you, and do not twist. Sleep on a firm mattress.  Lie on your side, and bend your knees.  If you lie on your back, put a pillow under your knees. Only take medicines as told by your doctor. Put ice on the injured area. Put ice in a plastic bag Place a towel between your skin and the bag Leave the ice on for 15-20 minutes, 3-4 times a day for the first 2-3 days. 210 After that, you can switch between ice and heat packs. Ask your doctor about back exercises or massage. Avoid feeling anxious or stressed.  Find good ways to deal with stress, such as exercise.  Get Help Right Way If: Your pain does not go away with rest or medicine. Your pain does not go away in 1 week. You have new problems. You do not feel well. The pain spreads into your legs. You cannot control when you poop (bowel movement) or pee  (urinate) You feel sick to your stomach (nauseous) or throw up (vomit) You have belly (abdominal) pain. You feel like you may pass out (faint). If you develop a fever.  Make Sure you: Understand these instructions. Will watch your condition Will get help right away if you are not doing well or get worse.  Your e-visit answers were reviewed by a board certified advanced clinical practitioner to complete your personal care plan.  Depending on the condition, your plan could have included both over the counter or prescription medications.  If there is a problem please reply  once you have received a response from your provider.  Your safety is important to us .  If you have drug allergies check your prescription carefully.    You can use MyChart to ask questions about today's visit, request a non-urgent call back, or ask for a work or school excuse for 24 hours related to this e-Visit. If it has been greater than 24 hours you will need to follow up with your provider, or enter a new e-Visit to address those concerns.  You will get an e-mail in the next two days asking about your experience.  I hope that your e-visit has been valuable and will speed your recovery. Thank you for using e-visits.  I have spent 5 minutes in review of e-visit questionnaire, review and updating patient chart, medical decision making and response to patient.   [name], PA-C

## 2024-08-15 ENCOUNTER — Other Ambulatory Visit (HOSPITAL_BASED_OUTPATIENT_CLINIC_OR_DEPARTMENT_OTHER): Payer: Self-pay | Admitting: Student

## 2024-08-15 ENCOUNTER — Encounter (HOSPITAL_BASED_OUTPATIENT_CLINIC_OR_DEPARTMENT_OTHER): Payer: Self-pay | Admitting: Orthopaedic Surgery

## 2024-08-15 MED ORDER — TRAMADOL HCL 50 MG PO TABS: 50.0000 mg | ORAL_TABLET | Freq: Four times a day (QID) | ORAL | 0 refills | Status: DC | PRN

## 2024-08-18 ENCOUNTER — Ambulatory Visit

## 2024-08-18 ENCOUNTER — Other Ambulatory Visit (HOSPITAL_BASED_OUTPATIENT_CLINIC_OR_DEPARTMENT_OTHER): Payer: Self-pay

## 2024-08-19 ENCOUNTER — Inpatient Hospital Stay
Admission: RE | Admit: 2024-08-19 | Discharge: 2024-08-19 | Disposition: A | Source: Ambulatory Visit | Attending: Orthopaedic Surgery

## 2024-08-19 DIAGNOSIS — G8929 Other chronic pain: Secondary | ICD-10-CM

## 2024-08-20 ENCOUNTER — Telehealth: Admitting: Physician Assistant

## 2024-08-20 ENCOUNTER — Other Ambulatory Visit (HOSPITAL_BASED_OUTPATIENT_CLINIC_OR_DEPARTMENT_OTHER): Payer: Self-pay

## 2024-08-20 ENCOUNTER — Ambulatory Visit

## 2024-08-20 ENCOUNTER — Encounter (HOSPITAL_BASED_OUTPATIENT_CLINIC_OR_DEPARTMENT_OTHER): Payer: Self-pay

## 2024-08-20 ENCOUNTER — Encounter: Payer: Self-pay | Admitting: Family Medicine

## 2024-08-20 DIAGNOSIS — H00011 Hordeolum externum right upper eyelid: Secondary | ICD-10-CM | POA: Diagnosis not present

## 2024-08-20 DIAGNOSIS — N951 Menopausal and female climacteric states: Secondary | ICD-10-CM

## 2024-08-20 MED ORDER — ERYTHROMYCIN 5 MG/GM OP OINT
1.0000 | TOPICAL_OINTMENT | Freq: Every day | OPHTHALMIC | 0 refills | Status: AC
  Filled 2024-08-20: qty 3.5, 3d supply, fill #0
  Filled 2024-08-21: qty 3.5, 5d supply, fill #0

## 2024-08-20 NOTE — Telephone Encounter (Signed)
 Do you wish for pt to discuss w obgyn?

## 2024-08-20 NOTE — Addendum Note (Signed)
 Addended by: Kaylynne Andres E on: 08/20/2024 11:28 AM   Modules accepted: Orders

## 2024-08-20 NOTE — Progress Notes (Signed)
 We are sorry that you are not feeling well. Here is how we plan to help!  Based on what you have shared with me it looks like you have a stye.  A stye is an inflammation of the eyelid.  It is often a red, painful lump near the edge of the eyelid that may look like a boil or a pimple.  A stye develops when an infection occurs at the base of an eyelash.   We have made appropriate suggestions for you based upon your presentation: Simple styes can be treated without medical intervention.  Most styes either resolve spontaneously or resolve with simple home treatment by applying warm compresses or heated washcloth to the stye for about 10-15 minutes three to four times a day. This causes the stye to drain and resolve. and I have prescribed Erythromycin Ophthalmic ointment 0.5% Apply topically in affected eye daily at bedtime for 5 days. To apply: Tilt the head back and, pressing your finger gently on the skin just beneath the lower eyelid, pull the lower eyelid away from the eye to make a space. Squeeze a thin strip of ointment into this space. A 1-cm (approximately 1/3-inch) strip of ointment is usually enough, unless you have been told by your doctor to use a different amount. Let go of the eyelid and gently close the eyes. Keep the eyes closed for 1 or 2 minutes to allow the medicine to come into contact with the infection.      HOME CARE:  Wash your hands often! Let the stye open on its own. Don't squeeze or open it. Don't rub your eyes. This can irritate your eyes and let in bacteria.  If you need to touch your eyes, wash your hands first. Don't wear eye makeup or contact lenses until the area has healed.  GET HELP RIGHT AWAY IF:  Your symptoms do not improve. You develop blurred or loss of vision. Your symptoms worsen (increased discharge, pain or redness).  Thank you for choosing an e-visit.  Your e-visit answers were reviewed by a board certified advanced clinical practitioner to complete  your personal care plan.  Depending upon the condition, your plan could have included both over the counter or prescription medications.  Please review your pharmacy choice.  Make sure the pharmacy is open so you can pick up prescription now.  If there is a problem, you may contact your provider through [employer] and have the prescription routed to another pharmacy.    Your safety is important to us .  If you have drug allergies check your prescription carefully.  For the next 24 hours you can use MyChart to ask questions about today's visit, request a non-urgent call back, or ask for a work or school excuse.  You will get an email in the next two days asking about your experience.  I hope you that your e-visit has been valuable and will speed your recovery.  I have spent 5 minutes in review of e-visit questionnaire, review and updating patient chart, medical decision making and response to patient.   Mary CHRISTELLA Dickinson, PA-C

## 2024-08-21 ENCOUNTER — Ambulatory Visit (HOSPITAL_BASED_OUTPATIENT_CLINIC_OR_DEPARTMENT_OTHER): Admitting: Student

## 2024-08-21 ENCOUNTER — Ambulatory Visit (INDEPENDENT_AMBULATORY_CARE_PROVIDER_SITE_OTHER)

## 2024-08-21 ENCOUNTER — Other Ambulatory Visit (HOSPITAL_BASED_OUTPATIENT_CLINIC_OR_DEPARTMENT_OTHER): Payer: Self-pay

## 2024-08-21 DIAGNOSIS — G8929 Other chronic pain: Secondary | ICD-10-CM

## 2024-08-21 DIAGNOSIS — M79671 Pain in right foot: Secondary | ICD-10-CM

## 2024-08-21 DIAGNOSIS — M25511 Pain in right shoulder: Secondary | ICD-10-CM

## 2024-08-21 MED ORDER — TRIAMCINOLONE ACETONIDE 40 MG/ML IJ SUSP
2.0000 mL | INTRAMUSCULAR | Status: AC | PRN
  Administered 2024-08-21: 2 mL via INTRA_ARTICULAR

## 2024-08-21 MED ORDER — LIDOCAINE HCL 1 % IJ SOLN
4.0000 mL | INTRAMUSCULAR | Status: AC | PRN
  Administered 2024-08-21: 4 mL

## 2024-08-21 NOTE — Progress Notes (Signed)
 Chief Complaint: Right shoulder, right foot pain     History of Present Illness:   08/21/24: Patient is a 53 year old left-hand-dominant female who presents today for follow-up of her right shoulder as well as pain in the right great toe.  Her shoulder continues to be significantly painful and limiting to her daily activities.  She is taking tramadol  and tizanidine  with some temporary relief.  She recently underwent MRI on 11/18 of the shoulder.  Patient also reports today that she has pain and bruising over the right great toe.  She does have surgical history of the right foot including heel spur removal and plantar fascia release.  She does not recall any recent injury.   07/30/24: WAVIE Chambers is a 53 y.o. female presents today with ongoing right shoulder as well as left knee pain.  She does have a distant history of rotator cuff surgery which she was unable to get addressed at the time as she did have young children.  She has been have persistent pain with overhead activity as well as range of motion.  This does bother her with basic activities of daily living.  With regard to the left knee she does have a history of left knee patellar osteoarthritis for which she has had previous injections nearly a decade prior which gave her significantly good relief   PMH/PSH/Family History/Social History/Meds/Allergies:    Past Medical History:  Diagnosis Date   Abscess 06/23/2021   Achilles tendonitis 01/16/2014   Overview:   Followed by [employer].  Treated with brace and diclofenac .   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  Follow-up  with Dr. Zena.  Watch for opportunities to increase physical activity within neuromuscular capabilities.   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   Transaminitis 06/23/2021   Urinary incontinence 06/03/2010   Vertebral osteomyelitis (HCC)    Past Surgical History:  Procedure Laterality Date   ABDOMINAL HYSTERECTOMY     AORTIC VALVE REPLACEMENT N/A 11/07/2021   Procedure: AORTIC VALVE REPLACEMENT USING A On-X AORTIC VALVE.;  Surgeon: Shyrl Linnie KIDD, MD;  Location: [facility] OR;  Service: Open Heart Surgery;  Laterality: N/A;   BACK SURGERY     BUBBLE STUDY  08/31/2021   Procedure: BUBBLE STUDY;  Surgeon: Hobart Powell BRAVO, MD;  Location: [facility] ENDOSCOPY;  Service: Cardiovascular;;   CARDIAC VALVE REPLACEMENT  11/07/2021   CARPAL TUNNEL RELEASE Bilateral    CHOLECYSTECTOMY     HERNIA REPAIR     INGUINAL HERNIA REPAIR Left 02/29/2024   Procedure: REPAIR, HERNIA, INGUINAL, LAPAROSCOPIC;  Surgeon: Medical Sales Representative,  Deward PARAS, MD;  Location: [facility] ORS;  Service: General;  Laterality: Left;  LATERALITY: LEFT   IR FLUORO GUIDED NEEDLE PLC ASPIRATION/INJECTION LOC  06/23/2021   LITHOTRIPSY     neck fusion     RIGHT/LEFT HEART CATH AND CORONARY ANGIOGRAPHY N/A 10/07/2021   Procedure: RIGHT/LEFT HEART CATH AND CORONARY ANGIOGRAPHY;  Surgeon: Dann Candyce RAMAN, MD;  Location: [facility] INVASIVE CV LAB;  Service: Cardiovascular;  Laterality: N/A;   SPINE SURGERY     TEE WITHOUT CARDIOVERSION N/A 06/28/2021   Procedure: TRANSESOPHAGEAL ECHOCARDIOGRAM (TEE);   Surgeon: Pietro Redell RAMAN, MD;  Location: [facility] ENDOSCOPY;  Service: Cardiovascular;  Laterality: N/A;   TEE WITHOUT CARDIOVERSION N/A 08/31/2021   Procedure: TRANSESOPHAGEAL ECHOCARDIOGRAM (TEE);  Surgeon: Hobart Powell BRAVO, MD;  Location: [facility] ENDOSCOPY;  Service: Cardiovascular;  Laterality: N/A;   TEE WITHOUT CARDIOVERSION N/A 11/07/2021   Procedure: TRANSESOPHAGEAL ECHOCARDIOGRAM (TEE);  Surgeon: Shyrl Linnie KIDD, MD;  Location: [facility] OR;  Service: Open Heart Surgery;  Laterality: N/A;   TUBAL LIGATION     UMBILICAL HERNIA REPAIR N/A 02/29/2024   Procedure: REPAIR, HERNIA, UMBILICAL, ADULT;  Surgeon: Lyndel Deward PARAS, MD;  Location: [facility] ORS;  Service: General;  Laterality: N/A;   Social History   Socioeconomic History   Marital status: Single    Spouse name: Not on file   Number of children: Not on file   Years of education: Not on file   Highest education level: Associate degree: academic program  Occupational History   Not on file  Tobacco Use   Smoking status: Never    Passive exposure: Never   Smokeless tobacco: Never  Vaping Use   Vaping status: Never Used  Substance and Sexual Activity   Alcohol use: No   Drug use: Never   Sexual activity: Yes    Birth control/protection: None    Comment: With same partner for several years  Other Topics Concern   Not on file  Social History Narrative   Not on file   Social Drivers of Health   Financial Resource Strain: Medium Risk (07/25/2024)   Overall Financial Resource Strain (CARDIA)    Difficulty of Paying Living Expenses: Somewhat hard  Food Insecurity: Food Insecurity Present (07/25/2024)   Hunger Vital Sign    Worried About Running Out of Food in the Last Year: Sometimes true    Ran Out of Food in the Last Year: Sometimes true  Transportation Needs: Unmet Transportation Needs (07/25/2024)   PRAPARE - Transportation    Lack of Transportation (Medical): Yes    Lack of Transportation (Non-Medical): Yes  Physical  Activity: Inactive (07/25/2024)   Exercise Vital Sign    Days of Exercise per Week: 0 days    Minutes of Exercise per Session: Not on file  Stress: Stress Concern Present (07/25/2024)   [employer] of Occupational Health - Occupational Stress Questionnaire    Feeling of Stress: To some extent  Social Connections: Moderately Integrated (07/25/2024)   Social Connection and Isolation Panel    Frequency of Communication with Friends and Family: More than three times a week    Frequency of Social Gatherings with Friends and Family: More than three times a week    Attends Religious Services: More than 4 times per year    Active Member of [employer] or Organizations: Yes    Attends Engineer, Structural: More than 4 times per year    Marital Status: Divorced   Family History  Problem Relation Age of Onset  Diabetes Mother    Hypertension Mother    Arthritis Mother    Depression Mother    Obesity Mother    Varicose Veins Mother    Cancer Father    Parkinson's disease Father    Heart disease Father    Heart disease Sister    Heart disease Brother    Obesity Brother    Diabetes Maternal Grandmother    Hypertension Sister    Obesity Sister    ADD / ADHD Daughter    ADD / ADHD Son    Colon cancer Neg Hx    Rectal cancer Neg Hx    Stomach cancer Neg Hx    Allergies  Allergen Reactions   Sulfur Itching   Baclofen Hives   Dilaudid  [Hydromorphone ] Itching   Latex     When inside the body it causes irritation  Other Reaction(s): Not available  latex   Oxycodone -Acetaminophen  Hives   Semaglutide  Nausea And Vomiting   Septra [Sulfamethoxazole-Trimethoprim] Hives   Vibramycin [Doxycycline] Itching   Current Outpatient Medications  Medication Sig Dispense Refill   amLODipine  (NORVASC ) 2.5 MG tablet TAKE 1 TABLET BY MOUTH EVERY DAY 90 tablet 0   amoxicillin  (AMOXIL ) 500 MG capsule TAKE 4 CAPSULES (2,000 MG TOTAL) BY MOUTH ONCE FOR 1 DOSE. PATIENT TO TAKE 4 TABLETS PRIOR  TO DENTAL WORK. (Patient not taking: Reported on 07/29/2024) 4 capsule 10   aspirin  EC 81 MG tablet Take 1 tablet (81 mg total) by mouth daily. Swallow whole. 30 tablet 11   atorvastatin  (LIPITOR) 10 MG tablet TAKE 1 TABLET BY MOUTH EVERY DAY IN THE EVENING 90 tablet 3   ELDERBERRY PO Take 50 mg by mouth daily.     erythromycin  ophthalmic ointment Place 1 Application into the right eye at bedtime for 5 days. 5 g 0   escitalopram  (LEXAPRO ) 20 MG tablet TAKE 1 TABLET BY MOUTH EVERY DAY 90 tablet 1   fluticasone  (FLONASE ) 50 MCG/ACT nasal spray SPRAY 2 SPRAYS INTO EACH NOSTRIL EVERY DAY 16 mL 6   furosemide  (LASIX ) 20 MG tablet Take 1 tablet (20 mg total) by mouth daily. 90 tablet 3   gabapentin (NEURONTIN) 100 MG capsule Take 100 mg by mouth 3 (three) times daily.     loperamide  (IMODIUM ) 2 MG capsule Take 1 capsule (2 mg total) by mouth as needed for diarrhea or loose stools. 30 capsule 0   LORazepam  (ATIVAN ) 1 MG tablet Take 1 tablet (1 mg total) by mouth every 8 (eight) hours. 2 tablet 0   Magnesium  250 MG CAPS Take 250 mg by mouth daily as needed (feet burning).     metoprolol  tartrate (LOPRESSOR ) 25 MG tablet Take 0.5 tablets (12.5 mg total) by mouth 2 (two) times daily. 90 tablet 3   Multiple Vitamin (MULTIVITAMIN WITH MINERALS) TABS tablet Take 2 tablets by mouth in the morning.     naproxen  (NAPROSYN ) 250 MG tablet Take 250 mg by mouth 2 (two) times daily.     pantoprazole  (PROTONIX ) 40 MG tablet TAKE 1 TABLET BY MOUTH EVERY DAY 90 tablet 3   Potassium 99 MG TABS Take 198 mg by mouth daily as needed (when taking lasix ).     [START ON 10/22/2024] tirzepatide  (ZEPBOUND ) 10 MG/0.5ML Pen Inject 10 mg into the skin once a week. (Patient not taking: Reported on 07/29/2024) 2 mL 0   [START ON 11/22/2024] tirzepatide  (ZEPBOUND ) 12.5 MG/0.5ML Pen Inject 12.5 mg into the skin once a week. (Patient not taking: Reported on 07/29/2024) 2  mL 0   [START ON 12/20/2024] tirzepatide (ZEPBOUND) 15 MG/0.5ML Pen  Inject 15 mg into the skin once a week. (Patient not taking: Reported on 07/29/2024) 2 mL 0   tirzepatide (ZEPBOUND) 2.5 MG/0.5ML Pen Inject 2.5 mg into the skin once a week. (Patient not taking: Reported on 07/29/2024) 2 mL 0   [START ON 08/22/2024] tirzepatide (ZEPBOUND) 5 MG/0.5ML Pen Inject 5 mg into the skin once a week. (Patient not taking: Reported on 07/29/2024) 2 mL 0   [START ON 09/21/2024] tirzepatide (ZEPBOUND) 7.5 MG/0.5ML Pen Inject 7.5 mg into the skin once a week. (Patient not taking: Reported on 07/29/2024) 2 mL 0   tiZANidine  (ZANAFLEX ) 2 MG tablet Take 1 tablet (2 mg total) by mouth every 8 (eight) hours as needed for muscle spasms. 30 tablet 0   vitamin C  (ASCORBIC ACID ) 250 MG tablet Take 500 mg by mouth daily.     warfarin (COUMADIN ) 5 MG tablet TAKE 1/2 TO 1 TABLET DAILY OR AS DIRECTED BY COUMADIN  CLINIC 90 tablet 1   No current facility-administered medications for this visit.   DG Foot Complete Right Result Date: 08/21/2024 EXAM: 3 OR MORE VIEW(S) XRAY OF THE RIGHT FOOT 08/21/2024 09:41:16 AM COMPARISON: None available. CLINICAL HISTORY: pain FINDINGS: BONES AND JOINTS: No acute fracture. No focal osseous lesion. No joint dislocation. 2nd, 3rd, and 4th digit Hammertoe deformities. Mild hallux valgus deformity. Mild midfoot and first MTP joint osteoarthritis. Bony demineralization. Dorsal spurring at the talonavicular articulation and in the midfoot. Achilles calcaneal spur. Mild flattening of the longitudinal arch of the foot on the lateral standing projection. SOFT TISSUES: Dorsal subcutaneous edema along the forefoot. IMPRESSION: 1. Mild hallux valgus deformity and 2nd, 3rd, and 4th digit hammertoe deformities. 2. Mild midfoot and first MTP joint osteoarthritis. 3. Dorsal spurring at the talonavicular articulation and in the midfoot. 4. Achilles calcaneal spur. 5. Dorsal subcutaneous edema along the forefoot. 6. Mild flattening of the longitudinal arch of the foot on the  lateral standing projection. Electronically signed by: Ryan Salvage MD 08/21/2024 10:11 AM EST RP Workstation: [id number]    Review of Systems:   A ROS was performed including pertinent positives and negatives as documented in the HPI.  Physical Exam :   Constitutional: NAD and appears stated age Neurological: Alert and oriented Psych: Appropriate affect and cooperative There were no vitals taken for this visit.   Comprehensive Musculoskeletal Exam:    Exam of the right shoulder demonstrates active range of motion to 150 degrees forward flexion and external rotation to 40 degrees.  Tenderness over the anterior glenohumeral joint and lateral deltoid.  No erythema or warmth. Right great toe demonstrates diffuse ecchymosis without erythema over the dorsal aspect.  She demonstrates decreased sensation to the toes.  Flexor and extensor mechanisms are intact.  Brisk capillary refill distally.  Imaging:   Xray (right foot 3 views): No evidence of acute fracture or dislocation.  Diffuse degenerative changes.    I personally reviewed and interpreted the radiographs.   Assessment and Plan:   53 y.o. female who presents today with persistent and chronic right shoulder pain.  Patient was recently seen by Dr. Genelle and had an MRI ordered of the right shoulder which was recently completed.  Initial review of this shows complete rupture of the supraspinatus with no intact fibers and significant tearing within the infraspinatus and subscapularis.  She does have concerns about being able to use her shoulder for daily activities, particularly with Thanksgiving coming up and needing  to prepare food.  I have recommended a subacromial cortisone injection to see if this gives her some symptomatic relief.  Patient is agreeable and injection was performed without any complication.  She also noted today some bruising over the right great toe and is unsure of any mechanism of injury.  She does have decreased  sensation into the toes at baseline.  X-rays today show no evidence of acute bony abnormalities, so a postop shoe was provided for comfort and will follow-up on this as needed.  Patient is scheduled to see Dr. Genelle on 12/5 for MRI review and treatment discussion.     Procedure Note  Patient: Mary Chambers             Date of Birth: September 24, 1971           MRN: [id number]             Visit Date: 08/21/2024  Procedures: Visit Diagnoses:  1. Pain in right foot   2. Chronic right shoulder pain      Large Joint Inj: R subacromial bursa on 08/21/2024 1:26 PM Indications: pain Details: 22 G 1.5 in needle, posterior approach Medications: 4 mL lidocaine  1 %; 2 mL triamcinolone  acetonide 40 MG/ML Outcome: tolerated well, no immediate complications Procedure, treatment alternatives, risks and benefits explained, specific risks discussed. Consent was given by the patient. Immediately prior to procedure a time out was called to verify the correct patient, procedure, equipment, support staff and site/side marked as required. Patient was prepped and draped in the usual sterile fashion.       I personally saw and evaluated the patient, and participated in the management and treatment plan.   Leonce Reveal, PA-C Orthopedics  This document was dictated using Conservation officer, historic buildings. A reasonable attempt at proof reading has been made to minimize errors.

## 2024-08-22 ENCOUNTER — Other Ambulatory Visit (HOSPITAL_BASED_OUTPATIENT_CLINIC_OR_DEPARTMENT_OTHER): Payer: Self-pay | Admitting: Student

## 2024-08-22 ENCOUNTER — Ambulatory Visit

## 2024-08-26 ENCOUNTER — Ambulatory Visit

## 2024-09-01 ENCOUNTER — Telehealth (HOSPITAL_BASED_OUTPATIENT_CLINIC_OR_DEPARTMENT_OTHER): Payer: Self-pay

## 2024-09-01 NOTE — Telephone Encounter (Signed)
   Pre-operative Risk Assessment    Patient Name: Mary Chambers  DOB: May 06, 1971 MRN: [id number]   Date of last office visit: 07/07/24 with Dr. Lonni Date of next office visit: 10/17/24 with Dr. Lonni   Request for Surgical Clearance    Procedure:  Lumber Epidural   Date of Surgery:  Clearance TBD                                 Surgeon:  NA Surgeon's Group or Practice Name:  [facility] Phone number:  [phone] Fax number:  [phone]   Type of Clearance Requested:   - Medical  - Pharmacy:  Hold Warfarin (Coumadin ) 5 days prior and resume day after. Aspirin  not indicated   Type of Anesthesia:  Not Indicated   Additional requests/questions:    [name]   09/01/2024, 9:39 AM

## 2024-09-01 NOTE — Telephone Encounter (Signed)
 Patient with diagnosis of mechanical aortic valve (On-X) on warfarin for anticoagulation.    Procedure:  Lumber Epidural    Date of Surgery:  Clearance TBD  CrCl > 100 Platelet count 254  Per office protocol, patient can hold warfarin for 5 days prior to procedure.   Patient will not need bridging with Lovenox  (enoxaparin ) around procedure.  Coumadin  clinic aware of no need to bridge  **This guidance is not considered finalized until pre-operative APP has relayed final recommendations.**

## 2024-09-01 NOTE — Telephone Encounter (Signed)
 Pharmacy please advise on holding coumadin  prior to lumbar epideral scheduled for TBD. Thank you.

## 2024-09-02 ENCOUNTER — Ambulatory Visit

## 2024-09-03 ENCOUNTER — Other Ambulatory Visit (HOSPITAL_BASED_OUTPATIENT_CLINIC_OR_DEPARTMENT_OTHER): Payer: Self-pay

## 2024-09-03 ENCOUNTER — Ambulatory Visit: Attending: Cardiology | Admitting: Pharmacist

## 2024-09-03 DIAGNOSIS — I4891 Unspecified atrial fibrillation: Secondary | ICD-10-CM

## 2024-09-03 DIAGNOSIS — Z952 Presence of prosthetic heart valve: Secondary | ICD-10-CM | POA: Diagnosis not present

## 2024-09-03 DIAGNOSIS — Z7901 Long term (current) use of anticoagulants: Secondary | ICD-10-CM

## 2024-09-03 DIAGNOSIS — I351 Nonrheumatic aortic (valve) insufficiency: Secondary | ICD-10-CM | POA: Diagnosis not present

## 2024-09-03 DIAGNOSIS — I824Y9 Acute embolism and thrombosis of unspecified deep veins of unspecified proximal lower extremity: Secondary | ICD-10-CM | POA: Diagnosis not present

## 2024-09-03 LAB — POCT INR: INR: 2 (ref 2.0–3.0)

## 2024-09-03 NOTE — Patient Instructions (Signed)
 Description   INR-2.0; Start taking 1 tablet daily, except 1/2 tablet every Monday and Friday.  INR in 4 weeks. [phone]

## 2024-09-03 NOTE — Progress Notes (Signed)
 Description   INR-2.0; Start taking 1 tablet daily, except 1/2 tablet every Monday and Friday.  INR in 4 weeks. [phone]

## 2024-09-04 ENCOUNTER — Ambulatory Visit

## 2024-09-05 ENCOUNTER — Ambulatory Visit (INDEPENDENT_AMBULATORY_CARE_PROVIDER_SITE_OTHER): Admitting: Orthopaedic Surgery

## 2024-09-05 ENCOUNTER — Ambulatory Visit (HOSPITAL_BASED_OUTPATIENT_CLINIC_OR_DEPARTMENT_OTHER): Payer: Self-pay | Admitting: Orthopaedic Surgery

## 2024-09-05 ENCOUNTER — Other Ambulatory Visit (HOSPITAL_BASED_OUTPATIENT_CLINIC_OR_DEPARTMENT_OTHER): Payer: Self-pay

## 2024-09-05 DIAGNOSIS — G8929 Other chronic pain: Secondary | ICD-10-CM

## 2024-09-05 DIAGNOSIS — M25511 Pain in right shoulder: Secondary | ICD-10-CM | POA: Diagnosis not present

## 2024-09-05 MED ORDER — HYDROCODONE-ACETAMINOPHEN 5-325 MG PO TABS
1.0000 | ORAL_TABLET | Freq: Four times a day (QID) | ORAL | 0 refills | Status: DC | PRN
  Filled 2024-09-05: qty 20, 5d supply, fill #0

## 2024-09-05 NOTE — Progress Notes (Signed)
 Chief Complaint: Right shoulder, left knee pain     History of Present Illness:   09/05/2024: Presents today for follow-up of the right shoulder as well as MRI discussion  Mary Chambers is a 53 y.o. female presents today with ongoing right shoulder as well as left knee pain.  She does have a distant history of rotator cuff surgery which she was unable to get addressed at the time as she did have young children.  She has been have persistent pain with overhead activity as well as range of motion.  This does bother her with basic activities of daily living.  With regard to the left knee she does have a history of left knee patellar osteoarthritis for which she has had previous injections nearly a decade prior which gave her significantly good relief    PMH/PSH/Family History/Social History/Meds/Allergies:    Past Medical History:  Diagnosis Date   Abscess 06/23/2021   Achilles tendonitis 01/16/2014   Overview:   Followed by [employer].  Treated with brace and diclofenac .   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  Follow-up with Dr. Zena.  Watch for opportunities to increase physical activity within neuromuscular capabilities.   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   Transaminitis 06/23/2021   Urinary incontinence 06/03/2010   Vertebral osteomyelitis (HCC)    Past Surgical History:  Procedure Laterality Date   ABDOMINAL HYSTERECTOMY     AORTIC VALVE REPLACEMENT N/A 11/07/2021   Procedure: AORTIC VALVE REPLACEMENT USING A On-X AORTIC VALVE.;  Surgeon: Shyrl Linnie KIDD, MD;  Location: [facility] OR;  Service: Open Heart Surgery;  Laterality: N/A;   BACK SURGERY     BUBBLE STUDY  08/31/2021   Procedure: BUBBLE STUDY;  Surgeon: Hobart Powell BRAVO, MD;  Location: [facility] ENDOSCOPY;  Service: Cardiovascular;;   CARDIAC VALVE REPLACEMENT  11/07/2021   CARPAL TUNNEL RELEASE Bilateral    CHOLECYSTECTOMY     HERNIA REPAIR     INGUINAL HERNIA REPAIR Left 02/29/2024   Procedure: REPAIR, HERNIA, INGUINAL, LAPAROSCOPIC;  Surgeon: Lyndel Deward PARAS, MD;  Location: [facility] ORS;  Service: General;  Laterality: Left;  LATERALITY: LEFT   IR FLUORO GUIDED NEEDLE PLC ASPIRATION/INJECTION LOC  06/23/2021   LITHOTRIPSY     neck fusion     RIGHT/LEFT HEART CATH AND CORONARY ANGIOGRAPHY N/A 10/07/2021   Procedure: RIGHT/LEFT HEART CATH AND CORONARY ANGIOGRAPHY;  Surgeon: Dann Candyce RAMAN, MD;  Location: [facility] INVASIVE CV LAB;  Service: Cardiovascular;  Laterality: N/A;   SPINE SURGERY     TEE WITHOUT CARDIOVERSION N/A 06/28/2021  Procedure: TRANSESOPHAGEAL ECHOCARDIOGRAM (TEE);  Surgeon: Pietro Redell RAMAN, MD;  Location: [facility] ENDOSCOPY;  Service: Cardiovascular;  Laterality: N/A;   TEE WITHOUT CARDIOVERSION N/A 08/31/2021   Procedure: TRANSESOPHAGEAL ECHOCARDIOGRAM (TEE);  Surgeon: Hobart Powell BRAVO, MD;  Location: [facility] ENDOSCOPY;  Service: Cardiovascular;  Laterality: N/A;   TEE WITHOUT CARDIOVERSION N/A 11/07/2021   Procedure: TRANSESOPHAGEAL ECHOCARDIOGRAM (TEE);  Surgeon: Shyrl Linnie KIDD, MD;  Location: [facility] OR;  Service: Open Heart Surgery;  Laterality: N/A;   TUBAL LIGATION      UMBILICAL HERNIA REPAIR N/A 02/29/2024   Procedure: REPAIR, HERNIA, UMBILICAL, ADULT;  Surgeon: Lyndel Deward PARAS, MD;  Location: [facility] ORS;  Service: General;  Laterality: N/A;   Social History   Socioeconomic History   Marital status: Single    Spouse name: Not on file   Number of children: Not on file   Years of education: Not on file   Highest education level: Associate degree: academic program  Occupational History   Not on file  Tobacco Use   Smoking status: Never    Passive exposure: Never   Smokeless tobacco: Never  Vaping Use   Vaping status: Never Used  Substance and Sexual Activity   Alcohol use: No   Drug use: Never   Sexual activity: Yes    Birth control/protection: None    Comment: With same partner for several years  Other Topics Concern   Not on file  Social History Narrative   Not on file   Social Drivers of Health   Financial Resource Strain: Medium Risk (07/25/2024)   Overall Financial Resource Strain (CARDIA)    Difficulty of Paying Living Expenses: Somewhat hard  Food Insecurity: Food Insecurity Present (07/25/2024)   Hunger Vital Sign    Worried About Running Out of Food in the Last Year: Sometimes true    Ran Out of Food in the Last Year: Sometimes true  Transportation Needs: Unmet Transportation Needs (07/25/2024)   PRAPARE - Transportation    Lack of Transportation (Medical): Yes    Lack of Transportation (Non-Medical): Yes  Physical Activity: Inactive (07/25/2024)   Exercise Vital Sign    Days of Exercise per Week: 0 days    Minutes of Exercise per Session: Not on file  Stress: Stress Concern Present (07/25/2024)   [employer] of Occupational Health - Occupational Stress Questionnaire    Feeling of Stress: To some extent  Social Connections: Moderately Integrated (07/25/2024)   Social Connection and Isolation Panel    Frequency of Communication with Friends and Family: More than three times a week    Frequency of Social Gatherings  with Friends and Family: More than three times a week    Attends Religious Services: More than 4 times per year    Active Member of [employer] or Organizations: Yes    Attends Engineer, Structural: More than 4 times per year    Marital Status: Divorced   Family History  Problem Relation Age of Onset   Diabetes Mother    Hypertension Mother    Arthritis Mother    Depression Mother    Obesity Mother    Varicose Veins Mother    Cancer Father    Parkinson's disease Father    Heart disease Father    Heart disease Sister    Heart disease Brother    Obesity Brother    Diabetes Maternal Grandmother    Hypertension Sister    Obesity Sister    ADD / ADHD Daughter    ADD /  ADHD Son    Colon cancer Neg Hx    Rectal cancer Neg Hx    Stomach cancer Neg Hx    Allergies  Allergen Reactions   Sulfur Itching   Baclofen Hives   Dilaudid  [Hydromorphone ] Itching   Latex     When inside the body it causes irritation  Other Reaction(s): Not available  latex   Oxycodone -Acetaminophen  Hives   Semaglutide  Nausea And Vomiting   Septra [Sulfamethoxazole-Trimethoprim] Hives   Vibramycin [Doxycycline] Itching   Current Outpatient Medications  Medication Sig Dispense Refill   HYDROcodone -acetaminophen  (NORCO/VICODIN) 5-325 MG tablet Take 1 tablet by mouth every 6 (six) hours as needed for moderate pain (pain score 4-6). 20 tablet 0   amLODipine  (NORVASC ) 2.5 MG tablet TAKE 1 TABLET BY MOUTH EVERY DAY 90 tablet 0   amoxicillin  (AMOXIL ) 500 MG capsule TAKE 4 CAPSULES (2,000 MG TOTAL) BY MOUTH ONCE FOR 1 DOSE. PATIENT TO TAKE 4 TABLETS PRIOR TO DENTAL WORK. (Patient not taking: Reported on 07/29/2024) 4 capsule 10   aspirin  EC 81 MG tablet Take 1 tablet (81 mg total) by mouth daily. Swallow whole. 30 tablet 11   atorvastatin  (LIPITOR) 10 MG tablet TAKE 1 TABLET BY MOUTH EVERY DAY IN THE EVENING 90 tablet 3   ELDERBERRY PO Take 50 mg by mouth daily.     escitalopram  (LEXAPRO ) 20 MG tablet TAKE  1 TABLET BY MOUTH EVERY DAY 90 tablet 1   fluticasone  (FLONASE ) 50 MCG/ACT nasal spray SPRAY 2 SPRAYS INTO EACH NOSTRIL EVERY DAY 16 mL 6   furosemide  (LASIX ) 20 MG tablet Take 1 tablet (20 mg total) by mouth daily. 90 tablet 3   gabapentin (NEURONTIN) 100 MG capsule Take 100 mg by mouth 3 (three) times daily.     loperamide  (IMODIUM ) 2 MG capsule Take 1 capsule (2 mg total) by mouth as needed for diarrhea or loose stools. 30 capsule 0   LORazepam  (ATIVAN ) 1 MG tablet Take 1 tablet (1 mg total) by mouth every 8 (eight) hours. 2 tablet 0   Magnesium  250 MG CAPS Take 250 mg by mouth daily as needed (feet burning).     metoprolol  tartrate (LOPRESSOR ) 25 MG tablet Take 0.5 tablets (12.5 mg total) by mouth 2 (two) times daily. 90 tablet 3   Multiple Vitamin (MULTIVITAMIN WITH MINERALS) TABS tablet Take 2 tablets by mouth in the morning.     naproxen  (NAPROSYN ) 250 MG tablet Take 250 mg by mouth 2 (two) times daily.     pantoprazole  (PROTONIX ) 40 MG tablet TAKE 1 TABLET BY MOUTH EVERY DAY 90 tablet 3   Potassium 99 MG TABS Take 198 mg by mouth daily as needed (when taking lasix ).     [START ON 10/22/2024] tirzepatide  (ZEPBOUND ) 10 MG/0.5ML Pen Inject 10 mg into the skin once a week. (Patient not taking: Reported on 07/29/2024) 2 mL 0   [START ON 11/22/2024] tirzepatide  (ZEPBOUND ) 12.5 MG/0.5ML Pen Inject 12.5 mg into the skin once a week. (Patient not taking: Reported on 07/29/2024) 2 mL 0   [START ON 12/20/2024] tirzepatide  (ZEPBOUND ) 15 MG/0.5ML Pen Inject 15 mg into the skin once a week. (Patient not taking: Reported on 07/29/2024) 2 mL 0   tirzepatide  (ZEPBOUND ) 2.5 MG/0.5ML Pen Inject 2.5 mg into the skin once a week. (Patient not taking: Reported on 07/29/2024) 2 mL 0   tirzepatide  (ZEPBOUND ) 5 MG/0.5ML Pen Inject 5 mg into the skin once a week. (Patient not taking: Reported on 07/29/2024) 2 mL 0   [START  ON 09/21/2024] tirzepatide  (ZEPBOUND ) 7.5 MG/0.5ML Pen Inject 7.5 mg into the skin once a week.  (Patient not taking: Reported on 07/29/2024) 2 mL 0   tiZANidine  (ZANAFLEX ) 2 MG tablet Take 1 tablet (2 mg total) by mouth every 8 (eight) hours as needed for muscle spasms. 30 tablet 0   vitamin C  (ASCORBIC ACID ) 250 MG tablet Take 500 mg by mouth daily.     warfarin (COUMADIN ) 5 MG tablet TAKE 1/2 TO 1 TABLET DAILY OR AS DIRECTED BY COUMADIN  CLINIC 90 tablet 1   No current facility-administered medications for this visit.   No results found.  Review of Systems:   A ROS was performed including pertinent positives and negatives as documented in the HPI.  Physical Exam :   Constitutional: NAD and appears stated age Neurological: Alert and oriented Psych: Appropriate affect and cooperative There were no vitals taken for this visit.   Comprehensive Musculoskeletal Exam:    Left knee with tenderness about the patellofemoral joint range of motion is from 0 to 100 degrees with crepitus neurosensory exam is intact  Right shoulder overhead range of motion is to 150 degrees external rotation at the side is 50 degrees internal rotation is L1 there is pain with forward elevation and a positive Neer impingement   Imaging:   Xray (3 views right shoulder, 4 views left knee): Advanced left knee patellofemoral osteoarthritis, right knee Superior humeral head migration consistent with rotator cuff arthropathy     I personally reviewed and interpreted the radiographs.   Assessment and Plan:   53 y.o. female with evidence of right shoulder rotator cuff arthropathy in the setting of a nonpreviously treated rotator cuff tendon tear.  At this time she is I did discuss that unfortunately her MRI does show evidence of rotator cuff arthropathy with atrophy on the T1 view.  Given this we did discuss treatment options.  She has now failed an injection.  She has trialed strengthening in the past without relief.  Given this we did discuss possibility of reverse shoulder arthroplasty.  I do ultimately believe  that unfortunately I do not believe she would do well with a rotator cuff repair due to the chronicity of the tear.  We did discuss risk limitations associated with her reverse shoulder arthroplasty.  I discussed the associated recovery timeframe and after that she would like to proceed  -Plan for right shoulder reverse shoulder arthroplasty   After a lengthy discussion of treatment options, including risks, benefits, alternatives, complications of surgical and nonsurgical conservative options, the patient elected surgical repair.   The patient  is aware of the material risks  and complications including, but not limited to injury to adjacent structures, neurovascular injury, infection, numbness, bleeding, implant failure, thermal burns, stiffness, persistent pain, failure to heal, disease transmission from allograft, need for further surgery, dislocation, anesthetic risks, blood clots, risks of death,and others. The probabilities of surgical success and failure discussed with patient given their particular co-morbidities.The time and nature of expected rehabilitation and recovery was discussed.The patient's questions were all answered preoperatively.  No barriers to understanding were noted. I explained the natural history of the disease process and Rx rationale.  I explained to the patient what I considered to be reasonable expectations given their personal situation.  The final treatment plan was arrived at through a shared patient decision making process model.    I personally saw and evaluated the patient, and participated in the management and treatment plan.  Elspeth Parker, MD Attending  Physician, Orthopedic Surgery  This document was dictated using Dragon voice recognition software. A reasonable attempt at proof reading has been made to minimize errors.

## 2024-09-07 ENCOUNTER — Encounter: Payer: Self-pay | Admitting: Physician Assistant

## 2024-09-07 ENCOUNTER — Telehealth: Admitting: Physician Assistant

## 2024-09-07 DIAGNOSIS — K219 Gastro-esophageal reflux disease without esophagitis: Secondary | ICD-10-CM

## 2024-09-07 NOTE — Progress Notes (Signed)
 The patient no-showed for appointment despite this provider sending direct link, reaching out via phone with no response and waiting for at least 10 minutes from appointment time for patient to join. They will be marked as a NS for this appointment/time.   Kasandra Knudsen Mayers, PA-C

## 2024-09-07 NOTE — Progress Notes (Signed)
   Thank you for the details you included in the comment boxes. Those details are very helpful in determining the best course of treatment for you and help us  to provide the best care.Because you are having symptoms despite taking Protonix  40 mg once daily, we recommend that you schedule a Virtual Urgent Care video visit in order for the provider to better assess what is going on.  The provider will be able to give you a more accurate diagnosis and treatment plan if we can more freely discuss your symptoms and with the addition of a virtual examination.   If you change your visit to a video visit, we will bill your insurance (similar to an office visit) and you will not be charged for this e-Visit. You will be able to stay at home and speak with the first available [facility] advanced practice provider. The link to do a video visit is in the drop down Menu tab of your Welcome screen in MyChart.

## 2024-09-08 ENCOUNTER — Encounter (HOSPITAL_BASED_OUTPATIENT_CLINIC_OR_DEPARTMENT_OTHER): Payer: Self-pay | Admitting: Orthopaedic Surgery

## 2024-09-08 ENCOUNTER — Other Ambulatory Visit: Payer: Self-pay | Admitting: Orthopaedic Surgery

## 2024-09-08 MED ORDER — TRAMADOL HCL 50 MG PO TABS: 50.0000 mg | ORAL_TABLET | Freq: Two times a day (BID) | ORAL | 2 refills | Status: DC

## 2024-09-08 NOTE — Telephone Encounter (Signed)
   Name: Mary Chambers  DOB: November 07, 1970  MRN: [id number]  Primary Cardiologist: Shelda Bruckner, MD   Preoperative team, please contact this patient and set up a phone call appointment for further preoperative risk assessment. Please obtain consent and complete medication review. Thank you for your help.  Patient also has appointment with Dr. Bruckner on 10/17/2024 which can be used as a preoperative visit if procedure date will be set past that time.  I confirm that guidance regarding antiplatelet and oral anticoagulation therapy has been completed and, if necessary, noted below.  Per office protocol, patient can hold warfarin for 5 days prior to procedure.   Patient will not need bridging with Lovenox  (enoxaparin ) around procedure.   Coumadin  clinic aware of no need to bridge  I also confirmed the patient resides in the state of [place] . As per  [facility] Medical Board telemedicine laws, the patient must reside in the state in which the provider is licensed.   [name], NP 09/08/2024, 8:33 AM [facility]

## 2024-09-08 NOTE — Telephone Encounter (Signed)
 Called patient who stated she is thinking of getting this done on Feb and she has an upcoming appointment on 1/16 therefore clearance can be addressed at ov and note has been made to appointment line that cardiac clearance is needed

## 2024-09-11 ENCOUNTER — Telehealth (HOSPITAL_BASED_OUTPATIENT_CLINIC_OR_DEPARTMENT_OTHER): Payer: Self-pay | Admitting: *Deleted

## 2024-09-11 NOTE — Telephone Encounter (Signed)
° °  Pre-operative Risk Assessment    Patient Name: Mary Chambers  DOB: 10/08/1970 MRN: [id number]   Date of last office visit: 07/07/2024 Date of next office visit: 10/17/2024  Request for Surgical Clearance    Procedure:  Right Reverse Shoulder Arthoplasty  Date of Surgery:  Clearance TBD                                 Surgeon:  Dr. Elspeth Parker Surgeon's Group or Practice Name:  [facility] Phone number:  [phone] Fax number:  [phone]   Type of Clearance Requested:   - Medical  - Pharmacy:  Hold Aspirin , Warfarin (Coumadin ), and Zepbound   coumadin  5 days prior, Aspirin  7 days prior, Zepbound  7 days prior.   Type of Anesthesia:  General    Additional requests/questions:  Pt has upcoming appt 1/16 pre op added to appt notes.  Signed, Edsel Grayce Sanders   09/11/2024, 12:05 PM

## 2024-09-12 ENCOUNTER — Encounter (HOSPITAL_BASED_OUTPATIENT_CLINIC_OR_DEPARTMENT_OTHER): Payer: Self-pay | Admitting: Cardiology

## 2024-09-15 ENCOUNTER — Ambulatory Visit

## 2024-09-17 ENCOUNTER — Other Ambulatory Visit (HOSPITAL_BASED_OUTPATIENT_CLINIC_OR_DEPARTMENT_OTHER): Payer: Self-pay

## 2024-09-22 ENCOUNTER — Telehealth: Payer: Self-pay

## 2024-09-22 NOTE — Telephone Encounter (Signed)
" ° °  Name: Mary Chambers  DOB: Dec 12, 1970  MRN: [id number]  Primary Cardiologist: Shelda Bruckner, MD   Preoperative team, please contact this patient and set up a phone call appointment for further preoperative risk assessment. Please obtain consent and complete medication review. Thank you for your help.  I confirm that guidance regarding antiplatelet and oral anticoagulation therapy has been completed and, if necessary, noted below.  Per office protocol, patient can hold warfarini for 5 days prior to procedure. Patient will not need bridging with Lovenox  (enoxaparin ) around procedure.  I also confirmed the patient resides in the state of [place] . As per [facility] Medical Board telemedicine laws, the patient must reside in the state in which the provider is licensed.  Miriam Shams, FNP-C  09/22/2024, 3:20 PM [facility] [address] 5th Floor Office [phone] Fax [phone]    "

## 2024-09-22 NOTE — Telephone Encounter (Signed)
 Patient with diagnosis of mechanical aortic valve (On-X) on warfarin for anticoagulation.    Procedure:  Right Reverse Shoulder Arthoplasty   Date of Surgery:  Clearance TBD      CrCl > 100 Platelet count 254  Per office protocol, patient can hold warfarini for 5 days prior to procedure.   Patient will not need bridging with Lovenox  (enoxaparin ) around procedure.  **This guidance is not considered finalized until pre-operative APP has relayed final recommendations.**

## 2024-09-22 NOTE — Telephone Encounter (Signed)
"  °  Patient Consent for Virtual Visit        Mary Chambers has provided verbal consent on 09/22/2024 for a virtual visit (video or telephone).   CONSENT FOR VIRTUAL VISIT FOR:  Mary Chambers  By participating in this virtual visit I agree to the following:  I hereby voluntarily request, consent and authorize [facility] and its employed or contracted physicians, physician assistants, nurse practitioners or other licensed health care professionals (the Practitioner), to provide me with telemedicine health care services (the Services) as deemed necessary by the treating Practitioner. I acknowledge and consent to receive the Services by the Practitioner via telemedicine. I understand that the telemedicine visit will involve communicating with the Practitioner through live audiovisual communication technology and the disclosure of certain medical information by electronic transmission. I acknowledge that I have been given the opportunity to request an in-person assessment or other available alternative prior to the telemedicine visit and am voluntarily participating in the telemedicine visit.  I understand that I have the right to withhold or withdraw my consent to the use of telemedicine in the course of my care at any time, without affecting my right to future care or treatment, and that the Practitioner or I may terminate the telemedicine visit at any time. I understand that I have the right to inspect all information obtained and/or recorded in the course of the telemedicine visit and may receive copies of available information for a reasonable fee.  I understand that some of the potential risks of receiving the Services via telemedicine include:  Delay or interruption in medical evaluation due to technological equipment failure or disruption; Information transmitted may not be sufficient (e.g. poor resolution of images) to allow for appropriate medical decision making by the  Practitioner; and/or  In rare instances, security protocols could fail, causing a breach of personal health information.  Furthermore, I acknowledge that it is my responsibility to provide information about my medical history, conditions and care that is complete and accurate to the best of my ability. I acknowledge that Practitioner's advice, recommendations, and/or decision may be based on factors not within their control, such as incomplete or inaccurate data provided by me or distortions of diagnostic images or specimens that may result from electronic transmissions. I understand that the practice of medicine is not an exact science and that Practitioner makes no warranties or guarantees regarding treatment outcomes. I acknowledge that a copy of this consent can be made available to me via my patient portal [facility] MyChart), or I can request a printed copy by calling the office of [facility].    I understand that my insurance will be billed for this visit.   I have read or had this consent read to me. I understand the contents of this consent, which adequately explains the benefits and risks of the Services being provided via telemedicine.  I have been provided ample opportunity to ask questions regarding this consent and the Services and have had my questions answered to my satisfaction. I give my informed consent for the services to be provided through the use of telemedicine in my medical care    "

## 2024-09-22 NOTE — Telephone Encounter (Signed)
 Preop tele appt now scheduled, med rec and consent done.

## 2024-09-26 ENCOUNTER — Ambulatory Visit
Admission: RE | Admit: 2024-09-26 | Discharge: 2024-09-26 | Disposition: A | Discharge status: Home / Self Care | Source: Ambulatory Visit | Attending: Orthopaedic Surgery | Admitting: Orthopaedic Surgery

## 2024-09-26 DIAGNOSIS — M25511 Pain in right shoulder: Secondary | ICD-10-CM | POA: Diagnosis present

## 2024-09-26 DIAGNOSIS — G8929 Other chronic pain: Secondary | ICD-10-CM | POA: Diagnosis present

## 2024-09-28 ENCOUNTER — Encounter (HOSPITAL_BASED_OUTPATIENT_CLINIC_OR_DEPARTMENT_OTHER): Payer: Self-pay | Admitting: Orthopaedic Surgery

## 2024-09-29 ENCOUNTER — Ambulatory Visit

## 2024-10-01 ENCOUNTER — Telehealth: Admitting: Physician Assistant

## 2024-10-01 ENCOUNTER — Ambulatory Visit: Attending: Internal Medicine | Admitting: Physician Assistant

## 2024-10-01 DIAGNOSIS — R6889 Other general symptoms and signs: Secondary | ICD-10-CM

## 2024-10-01 DIAGNOSIS — Z01810 Encounter for preprocedural cardiovascular examination: Secondary | ICD-10-CM

## 2024-10-01 DIAGNOSIS — Z20828 Contact with and (suspected) exposure to other viral communicable diseases: Secondary | ICD-10-CM

## 2024-10-01 MED ORDER — OSELTAMIVIR PHOSPHATE 75 MG PO CAPS: 75.0000 mg | ORAL_CAPSULE | Freq: Two times a day (BID) | ORAL | 0 refills | Status: DC

## 2024-10-01 MED ORDER — BENZONATATE 100 MG PO CAPS: 100.0000 mg | ORAL_CAPSULE | Freq: Three times a day (TID) | ORAL | 0 refills | Status: DC | PRN

## 2024-10-01 NOTE — Progress Notes (Signed)
 E visit for Flu like symptoms   We are sorry that you are not feeling well.  Here is how we plan to help! Based on what you have shared with me it looks like you may have a respiratory virus that may be influenza.  Influenza or the flu is  an infection caused by a respiratory virus. The flu virus is highly contagious and persons who did not receive their yearly flu vaccination may catch the flu from close contact.  We have anti-viral medications to treat the viruses that cause this infection. They are not a cure and only shorten the course of the infection. These prescriptions are most effective when they are given within the first 2 days of flu symptoms. Antiviral medications are indicated if you have a high risk of complications from the flu. You should  also consider an antiviral medication if you are in close contact with someone who is at risk. These medications can help patients avoid complications from the flu but have side effects that you should know.   Possible side effects from Tamiflu  or oseltamivir  include nausea, vomiting, diarrhea, dizziness, headaches, eye redness, sleep problems or other respiratory symptoms. You should not take Tamiflu  if you have an allergy to oseltamivir  or any to the ingredients in Tamiflu .  Based upon your symptoms and potential risk factors I have prescribed Oseltamivir  (Tamiflu ).  It has been sent to your designated pharmacy.  You will take one 75 mg capsule orally twice a day for the next 5 days.   For nasal congestion, you may use an oral decongestant such as Mucinex D or if you have glaucoma or high blood pressure use plain Mucinex.  Saline nasal spray or nasal drops can help and can safely be used as often as needed for congestion.  If you have a sore or scratchy throat, use a saltwater gargle-  to  teaspoon of salt dissolved in a 4-ounce to 8-ounce glass of warm water .  Gargle the solution for approximately 15-30 seconds and then spit.  It is  important not to swallow the solution.  You can also use throat lozenges/cough drops and Chloraseptic spray to help with throat pain or discomfort.  Warm or cold liquids can also be helpful in relieving throat pain.  For headache, pain or general discomfort, you can use Ibuprofen  or Tylenol  as directed.   Some authorities believe that zinc sprays or the use of Echinacea may shorten the course of your symptoms.  I have prescribed the following medications to help lessen symptoms: I have prescribed Tessalon  Perles 100 mg. You may take 1-2 capsules every 8 hours as needed for cough  You are to isolate at home until you have been fever-free for at least 24 hours without a fever-reducing medication, and symptoms have been steadily improving for 24 hours.  If you must be around other household members who do not have symptoms, you need to make sure that both you and the family members are masking consistently with a high-quality mask.  If you note any worsening of symptoms despite treatment, please seek an in-person evaluation ASAP. If you note any significant shortness of breath or any chest pain, please seek ED evaluation. Please do not delay care!  ANYONE WHO HAS FLU SYMPTOMS SHOULD: Stay home. The flu is highly contagious and going out or to work exposes others! Be sure to drink plenty of fluids. Water  is fine as well as fruit juices, sodas and electrolyte beverages. You may want to stay  away from caffeine  or alcohol. If you are nauseated, try taking small sips of liquids. How do you know if you are getting enough fluid? Your urine should be a pale yellow or almost colorless. Get rest. Taking a steamy shower or using a humidifier may help nasal congestion and ease sore throat pain. Using a saline nasal spray works much the same way. Cough drops, hard candies and sore throat lozenges may ease your cough. Line up a caregiver. Have someone check on you regularly.  GET HELP RIGHT AWAY IF: You cannot  keep down liquids or your medications. You become short of breath Your fell like you are going to pass out or loose consciousness. Your symptoms persist after you have completed your treatment plan  MAKE SURE YOU  Understand these instructions. Will watch your condition. Will get help right away if you are not doing well or get worse.  Your e-visit answers were reviewed by a board certified advanced clinical practitioner to complete your personal care plan.  Depending on the condition, your plan could have included both over the counter or prescription medications.  If there is a problem please reply  once you have received a response from your provider.  Your safety is important to us .  If you have drug allergies check your prescription carefully.    You can use MyChart to ask questions about todays visit, request a non-urgent call back, or ask for a work or school excuse for 24 hours related to this e-Visit. If it has been greater than 24 hours you will need to follow up with your provider, or enter a new e-Visit to address those concerns.  You will get an e-mail in the next two days asking about your experience.  I hope that your e-visit has been valuable and will speed your recovery. Thank you for using e-visits.   I have spent 5 minutes in review of e-visit questionnaire, review and updating patient chart, medical decision making and response to patient.   Delon CHRISTELLA Dickinson, PA-C

## 2024-10-01 NOTE — Progress Notes (Signed)
 "   Virtual Visit via Telephone Note   Because of RAUL WINTERHALTER co-morbid illnesses, she is at least at moderate risk for complications without adequate follow up.  This format is felt to be most appropriate for this patient at this time.  Due to technical limitations with video connection (technology), today's appointment will be conducted as an audio only telehealth visit, and Mary Chambers verbally agreed to proceed in this manner.   All issues noted in this document were discussed and addressed.  No physical exam could be performed with this format.  Evaluation Performed:  Preoperative cardiovascular risk assessment _____________   Date:  10/01/2024   Patient ID:  Mary Chambers, DOB March 03, 1971, MRN [id number] Patient Location:  Home Provider location:   Office  Primary Care Provider:  Lendia Boby CROME, NP-C Primary Cardiologist:  Shelda Bruckner, MD  Chief Complaint / Patient Profile   53 y.o. y/o female with a h/o aortic valve endocarditis status post AVR 2023, hypertension, obesity, arthritis, and depression who is pending right reverse shoulder arthroplasty and presents today for telephonic preoperative cardiovascular risk assessment.  History of Present Illness    Mary Chambers is a 53 y.o. female who presents via audio/video conferencing for a telehealth visit today.  Pt was last seen in cardiology clinic on 07/07/2024 by Dr. Bruckner.  At that time Mary Chambers was doing well.  The patient is now pending procedure as outlined above. Since her last visit, she No chest pains or SOB. She has a lot of mobility issues, back and leg pain.  She has not had any new cardiovascular issues.  She does meet minimum METS on the DASI.  Per office protocol, patient can hold warfarin for 5 days prior to procedure. Patient will not need bridging with Lovenox  (enoxaparin ) around procedure   She can hold aspirin  7 days prior to procedure and resume when medically  safe to do so.   Typical holding time for Zepbound  is 7 days, I do not see any issue with doing this.   Past Medical History    Past Medical History:  Diagnosis Date   Abscess 06/23/2021   Achilles tendonitis 01/16/2014   Overview:   Followed by [employer].  Treated with brace and diclofenac .   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  Follow-up with Dr. Zena.  Watch for opportunities to increase physical activity within neuromuscular capabilities.   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 ENDOSCOPY;  Service: Cardiovascular;;   CARDIAC VALVE REPLACEMENT  11/07/2021   CARPAL TUNNEL  RELEASE Bilateral    CHOLECYSTECTOMY     HERNIA REPAIR     INGUINAL HERNIA REPAIR Left 02/29/2024   Procedure: REPAIR, HERNIA, INGUINAL, LAPAROSCOPIC;  Surgeon: Lyndel Deward PARAS, MD;  Location: [facility] ORS;  Service: General;  Laterality: Left;  LATERALITY: LEFT   IR FLUORO GUIDED NEEDLE PLC ASPIRATION/INJECTION LOC  06/23/2021   LITHOTRIPSY     neck fusion     RIGHT/LEFT HEART CATH AND CORONARY ANGIOGRAPHY N/A 10/07/2021   Procedure: RIGHT/LEFT HEART CATH AND CORONARY ANGIOGRAPHY;  Surgeon: Dann Candyce RAMAN, MD;  Location: [facility] INVASIVE CV LAB;  Service: Cardiovascular;  Laterality: N/A;   SPINE SURGERY     TEE WITHOUT CARDIOVERSION N/A 06/28/2021   Procedure: TRANSESOPHAGEAL ECHOCARDIOGRAM (TEE);  Surgeon: Pietro Redell RAMAN, MD;  Location: [facility] ENDOSCOPY;  Service: Cardiovascular;  Laterality: N/A;   TEE WITHOUT CARDIOVERSION N/A 08/31/2021   Procedure: TRANSESOPHAGEAL ECHOCARDIOGRAM (TEE);  Surgeon: Hobart Powell BRAVO, MD;  Location: [facility] ENDOSCOPY;  Service: Cardiovascular;  Laterality: N/A;   TEE WITHOUT CARDIOVERSION N/A 11/07/2021   Procedure: TRANSESOPHAGEAL ECHOCARDIOGRAM (TEE);  Surgeon: Shyrl Linnie KIDD, MD;  Location: [facility] OR;  Service: Open Heart Surgery;  Laterality: N/A;   TUBAL LIGATION     UMBILICAL HERNIA REPAIR N/A 02/29/2024   Procedure: REPAIR, HERNIA, UMBILICAL, ADULT;  Surgeon: Lyndel Deward PARAS, MD;  Location: [facility] ORS;  Service: General;  Laterality: N/A;    Allergies  Allergies[1]  Home Medications    Prior to Admission medications  Medication Sig Start Date End Date Taking? Authorizing Provider  amLODipine  (NORVASC ) 2.5 MG tablet TAKE 1 TABLET BY MOUTH EVERY DAY 07/21/24   Walker, Caitlin S, NP  amoxicillin  (AMOXIL ) 500 MG capsule TAKE 4 CAPSULES (2,000 MG TOTAL) BY MOUTH ONCE FOR 1 DOSE. PATIENT TO TAKE 4 TABLETS PRIOR TO DENTAL WORK. 04/08/24   Lonni Slain, MD  aspirin  EC 81 MG tablet Take 1 tablet (81 mg total) by mouth daily. Swallow whole.  08/15/22   Lonni Slain, MD  atorvastatin  (LIPITOR) 10 MG tablet TAKE 1 TABLET BY MOUTH EVERY DAY IN THE EVENING 07/22/24   Lonni Slain, MD  benzonatate  (TESSALON ) 100 MG capsule Take 1-2 capsules (100-200 mg total) by mouth 3 (three) times daily as needed. 10/01/24   Vivienne Delon HERO, PA-C  ELDERBERRY PO Take 50 mg by mouth daily.    [provider]  escitalopram  (LEXAPRO ) 20 MG tablet TAKE 1 TABLET BY MOUTH EVERY DAY 07/18/24   de [place], Quintin PARAS, MD  fluticasone  (FLONASE ) 50 MCG/ACT nasal spray SPRAY 2 SPRAYS INTO EACH NOSTRIL EVERY DAY 05/19/24   [place], Quintin PARAS, MD  furosemide  (LASIX ) 20 MG tablet Take 1 tablet (20 mg total) by mouth daily. 08/15/22   Lonni Slain, MD  gabapentin (NEURONTIN) 100 MG capsule Take 100 mg by mouth 3 (three) times daily.    [provider]  HYDROcodone -acetaminophen  (NORCO/VICODIN) 5-325 MG tablet Take 1 tablet by mouth every 6 (six) hours as needed for moderate pain (pain score  4-6). 09/05/24   Genelle Standing, MD  loperamide  (IMODIUM ) 2 MG capsule Take 1 capsule (2 mg total) by mouth as needed for diarrhea or loose stools. 07/01/21   Samtani, Jai-Gurmukh, MD  LORazepam  (ATIVAN ) 1 MG tablet Take 1 tablet (1 mg total) by mouth every 8 (eight) hours. 07/30/24   Genelle Standing, MD  Magnesium  250 MG CAPS Take 250 mg by mouth daily as needed (feet burning).    [provider]  metoprolol  tartrate (LOPRESSOR ) 25 MG tablet Take 0.5 tablets (12.5 mg total) by mouth 2 (two) times daily. 07/29/24   Lonni Slain, MD  Multiple Vitamin (MULTIVITAMIN WITH MINERALS) TABS tablet Take 2 tablets by mouth in the morning.    [provider]  naproxen  (NAPROSYN ) 250 MG tablet Take 250 mg by mouth 2 (two) times daily. 04/03/24   [provider]  oseltamivir (TAMIFLU) 75 MG capsule Take 1 capsule (75 mg total) by mouth 2 (two) times daily. 10/01/24   Burnette, Jennifer M, PA-C  pantoprazole  (PROTONIX )  40 MG tablet TAKE 1 TABLET BY MOUTH EVERY DAY 03/20/24   de [place], Quintin PARAS, MD  Potassium 99 MG TABS Take 198 mg by mouth daily as needed (when taking lasix ).    [provider]  tirzepatide  (ZEPBOUND ) 10 MG/0.5ML Pen Inject 10 mg into the skin once a week. 10/22/24   Lonni Slain, MD  tirzepatide  (ZEPBOUND ) 12.5 MG/0.5ML Pen Inject 12.5 mg into the skin once a week. 11/22/24   Lonni Slain, MD  tirzepatide  (ZEPBOUND ) 15 MG/0.5ML Pen Inject 15 mg into the skin once a week. 12/20/24   Lonni Slain, MD  tirzepatide  (ZEPBOUND ) 2.5 MG/0.5ML Pen Inject 2.5 mg into the skin once a week. Patient not taking: Reported on 07/29/2024 07/22/24   Lonni Slain, MD  tirzepatide  (ZEPBOUND ) 5 MG/0.5ML Pen Inject 5 mg into the skin once a week. Patient not taking: Reported on 07/29/2024 08/22/24   Lonni Slain, MD  tirzepatide  (ZEPBOUND ) 7.5 MG/0.5ML Pen Inject 7.5 mg into the skin once a week. 09/21/24   Lonni Slain, MD  tiZANidine  (ZANAFLEX ) 2 MG tablet Take 1 tablet (2 mg total) by mouth every 8 (eight) hours as needed for muscle spasms. 08/14/24   Gladis Elsie BROCKS, PA-C  traMADol  (ULTRAM ) 50 MG tablet Take 1 tablet (50 mg total) by mouth 2 (two) times daily. 09/08/24   Genelle Standing, MD  vitamin C  (ASCORBIC ACID ) 250 MG tablet Take 500 mg by mouth daily.    [provider]  warfarin (COUMADIN ) 5 MG tablet TAKE 1/2 TO 1 TABLET DAILY OR AS DIRECTED BY COUMADIN  CLINIC 03/19/24   Lonni Slain, MD    Physical Exam    Vital Signs:  Mary Chambers does not have vital signs available for review today.  Given telephonic nature of communication, physical exam is limited. AAOx3. NAD. Normal affect.  Speech and respirations are unlabored.  Accessory Clinical Findings    None  Assessment & Plan    1.  Preoperative Cardiovascular Risk Assessment:  Mary Chambers perioperative risk of a major cardiac event is 0.4%  according to the Revised Cardiac Risk Index (RCRI).  Therefore, she is at low risk for perioperative complications.   Her functional capacity is fair at 4.73 METs according to the Duke Activity Status Index (DASI). Recommendations: According to ACC/AHA guidelines, no further cardiovascular testing needed.  The patient may proceed to surgery at acceptable risk.   Antiplatelet and/or Anticoagulation Recommendations: Aspirin  can be held for 7 days prior to  her surgery.  Please resume Aspirin  post operatively when it is felt to be safe from a bleeding standpoint.  Coumadin  can by held for 5 days prior to surgery.  Please resume post op when felt to be safe.   The patient was advised that if she develops new symptoms prior to surgery to contact our office to arrange for a follow-up visit, and she verbalized understanding.  A copy of this note will be routed to requesting surgeon.  Time:   Today, I have spent 10 minutes with the patient with telehealth technology discussing medical history, symptoms, and management plan.     Mary LOISE Fabry, PA-C  10/01/2024, 11:45 AM      [1]  Allergies Allergen Reactions   Sulfur Itching   Baclofen Hives   Dilaudid  [Hydromorphone ] Itching   Latex     When inside the body it causes irritation  Other Reaction(s): Not available  latex   Oxycodone -Acetaminophen  Hives   Semaglutide  Nausea And Vomiting   Septra [Sulfamethoxazole-Trimethoprim] Hives   Vibramycin [Doxycycline] Itching   "

## 2024-10-02 ENCOUNTER — Telehealth: Admitting: Family Medicine

## 2024-10-02 DIAGNOSIS — R112 Nausea with vomiting, unspecified: Secondary | ICD-10-CM

## 2024-10-02 MED ORDER — ONDANSETRON HCL 4 MG PO TABS: 4.0000 mg | ORAL_TABLET | Freq: Three times a day (TID) | ORAL | 0 refills | Status: AC | PRN

## 2024-10-02 NOTE — Progress Notes (Signed)

## 2024-10-08 ENCOUNTER — Other Ambulatory Visit (HOSPITAL_COMMUNITY): Payer: Self-pay

## 2024-10-08 ENCOUNTER — Ambulatory Visit: Attending: Cardiology | Admitting: *Deleted

## 2024-10-08 ENCOUNTER — Other Ambulatory Visit (HOSPITAL_BASED_OUTPATIENT_CLINIC_OR_DEPARTMENT_OTHER): Payer: Self-pay

## 2024-10-08 ENCOUNTER — Encounter (HOSPITAL_BASED_OUTPATIENT_CLINIC_OR_DEPARTMENT_OTHER): Payer: Self-pay | Admitting: Orthopaedic Surgery

## 2024-10-08 DIAGNOSIS — I4891 Unspecified atrial fibrillation: Secondary | ICD-10-CM | POA: Diagnosis not present

## 2024-10-08 DIAGNOSIS — I351 Nonrheumatic aortic (valve) insufficiency: Secondary | ICD-10-CM | POA: Diagnosis not present

## 2024-10-08 DIAGNOSIS — Z952 Presence of prosthetic heart valve: Secondary | ICD-10-CM | POA: Diagnosis not present

## 2024-10-08 DIAGNOSIS — I824Y9 Acute embolism and thrombosis of unspecified deep veins of unspecified proximal lower extremity: Secondary | ICD-10-CM | POA: Diagnosis not present

## 2024-10-08 DIAGNOSIS — Z7901 Long term (current) use of anticoagulants: Secondary | ICD-10-CM

## 2024-10-08 LAB — POCT INR: INR: 2.4 (ref 2.0–3.0)

## 2024-10-08 NOTE — Progress Notes (Signed)
 Description   INR-2.4; Today take 1/2 tablet then continue taking 1 tablet daily, except 1/2 tablet every Monday and Friday.  INR in 4 weeks. [phone] Fax-[phone]

## 2024-10-08 NOTE — Patient Instructions (Signed)
 Description   INR-2.4; Today take 1/2 tablet then continue taking 1 tablet daily, except 1/2 tablet every Monday and Friday.  INR in 4 weeks. [phone] [phone]

## 2024-10-09 ENCOUNTER — Other Ambulatory Visit: Payer: Self-pay

## 2024-10-09 ENCOUNTER — Encounter: Payer: Self-pay | Admitting: Pharmacist

## 2024-10-09 NOTE — Telephone Encounter (Signed)
 I spoke with the patient on 10/08/24 and scheduled her for surgery on 10/30/24.

## 2024-10-10 ENCOUNTER — Other Ambulatory Visit (HOSPITAL_BASED_OUTPATIENT_CLINIC_OR_DEPARTMENT_OTHER): Payer: Self-pay | Admitting: Family

## 2024-10-10 DIAGNOSIS — R002 Palpitations: Secondary | ICD-10-CM

## 2024-10-13 ENCOUNTER — Telehealth (HOSPITAL_BASED_OUTPATIENT_CLINIC_OR_DEPARTMENT_OTHER): Payer: Self-pay | Admitting: Physical Therapy

## 2024-10-13 NOTE — Telephone Encounter (Signed)
 Left 2nd VM attempting to confirm best location for PT services post 1/29 surgery.   Offered for referral to be placed at  [facility] at [address], [place], [place] location.   Dale Call PT, DPT 10/13/2024 12:16 PM

## 2024-10-14 ENCOUNTER — Other Ambulatory Visit (HOSPITAL_COMMUNITY): Payer: Self-pay

## 2024-10-14 ENCOUNTER — Other Ambulatory Visit: Payer: Self-pay

## 2024-10-15 ENCOUNTER — Telehealth: Payer: Self-pay | Admitting: *Deleted

## 2024-10-15 ENCOUNTER — Other Ambulatory Visit (HOSPITAL_BASED_OUTPATIENT_CLINIC_OR_DEPARTMENT_OTHER): Payer: Self-pay | Admitting: Orthopaedic Surgery

## 2024-10-15 DIAGNOSIS — G8929 Other chronic pain: Secondary | ICD-10-CM

## 2024-10-15 NOTE — Telephone Encounter (Signed)
 Patient called and stated she will be having her shoulder surgery on 10/30/24 and has been approved to hold warfarin 5 days. Surgical Clearance is in the chart on 09/11/24-hold 5 days no bridge. Advised patient that once she resumes she should take an extra 1/2 tablet for 2 days and resume normal dose and will keep the 11/05/24 appt. She verbalized understanding

## 2024-10-17 ENCOUNTER — Ambulatory Visit (HOSPITAL_BASED_OUTPATIENT_CLINIC_OR_DEPARTMENT_OTHER): Admitting: Cardiology

## 2024-10-17 ENCOUNTER — Encounter (HOSPITAL_BASED_OUTPATIENT_CLINIC_OR_DEPARTMENT_OTHER): Payer: Self-pay

## 2024-10-17 NOTE — Progress Notes (Incomplete)
 " Cardiology Office Note:  .   Date:  10/17/2024  ID:  [name], DOB Feb 10, 1971, MRN [id number] PCP: Lendia Boby CROME, NP-C  [facility] Providers Cardiologist:  Shelda Bruckner, MD {  History of Present Illness: .   Mary Chambers is a 54 y.o. female with a hx of aortic valve endocarditis s/p AVR 2023, hypertension, obesity, arthritis, and depression, who is seen for follow-up.   Today: Had virtual visit 10/01/24 for preoperative evaluation for shoulder replacement surgery with Dr. Genelle. She was   Has had hernia surgery, leg surgery, mouth surgery since our last visit, no issues at all.     ROS: Denies chest pain, shortness of breath at rest or with normal exertion. No PND, orthopnea, LE edema or unexpected weight gain. No syncope or palpitations. ROS otherwise negative except as noted.   Studies Reviewed: SABRA    EKG:       Physical Exam:   VS:  There were no vitals taken for this visit.   Wt Readings from Last 3 Encounters:  07/29/24 (!) 312 lb (141.5 kg)  07/28/24 (!) 313 lb 6.4 oz (142.2 kg)  07/07/24 (!) 313 lb 6.4 oz (142.2 kg)    GEN: Well nourished, well developed in no acute distress HEENT: Normal, moist mucous membranes NECK: No JVD CARDIAC: regular rhythm, normal S1 and S2, no rubs or gallops. No murmur. VASCULAR: Radial and DP pulses 2+ bilaterally. No carotid bruits RESPIRATORY:  Clear to auscultation without rales, wheezing or rhonchi  ABDOMEN: Soft, non-tender, non-distended MUSCULOSKELETAL:  normal bulk and tone SKIN: Warm and dry, no edema NEUROLOGIC:  Alert and oriented x 3. No focal neuro deficits noted. PSYCHIATRIC:  Normal affect    ASSESSMENT AND PLAN: .    Preoperative cardiovascular evaluation -I reviewed a paper from Tan et al from J Thoracic and Cardiovascular Surgery in 2018 noted that for mechanical bileaflet valve, low thromboembolic risk (no afib, previous event, reduced EF, etc) that it is reasonable to hold  warfarin prior to surgery without bridging, restarting warfarin once tolerating oral diet. I will confirm with our PharmD team that this is   Aortic valve endocarditis Severe aortic regurgitation Bicuspid aortic valve S/P AVR with 23mm On-X valve 11/07/21, Dr. Shyrl -on aspirin , coumadin  -has amoxicillin  for dental prophylaxis   Hypertension LE edema -continue low dose amlodipine  -only needs rare lasix , none recently   Morbid obesity BMI 43->49 -see evaluation for GLP in note 06/28/23. Weight at that visit 262 lb -unfortunately did not tolerate 0.5 mg semaglutide  due to vomiting, nausea, and abdominal pain with hard stools. She thought this was food poisoning, but given the duration and the fact that the symptoms resolved with stopping Wegovy , would not retrial. -she would consider tirzepatide  if it is covered by her insurance  OSA -stop-bang=7 -was on CPAP >5 years ago -will see if GLP covered for this. Sent a message to prior auth team.  CV risk counseling and prevention -recommend heart healthy/Mediterranean diet, with whole grains, fruits, vegetable, fish, lean meats, nuts, and olive oil. Limit salt. -recommend moderate walking, 3-5 times/week for 30-50 minutes each session. Aim for at least 150 minutes.week. Goal should be pace of 3 miles/hours, or walking 1.5 miles in 30 minutes -recommend avoidance of tobacco products. Avoid excess alcohol. -ASCVD risk score: The 10-year ASCVD risk score (Arnett DK, et al., 2019) is: 2.6%   Values used to calculate the score:     Age: 14 years  Clinically relevant sex: Female     Is Non-Hispanic African American: No     Diabetic: No     Tobacco smoker: No     Systolic Blood Pressure: 134 mmHg     Is BP treated: Yes     HDL Cholesterol: 54 mg/dL     Total Cholesterol: 192 mg/dL    Dispo: 6 months or sooner as needed  Signed, Shelda Bruckner, MD   Shelda Bruckner, MD, PhD, [facility]  [facility]  [facility] at [facility] at [facility] [address], [address] [place], [place] [address] [phone]   "

## 2024-10-24 ENCOUNTER — Other Ambulatory Visit (HOSPITAL_BASED_OUTPATIENT_CLINIC_OR_DEPARTMENT_OTHER): Payer: Self-pay | Admitting: Orthopaedic Surgery

## 2024-10-24 ENCOUNTER — Encounter (HOSPITAL_BASED_OUTPATIENT_CLINIC_OR_DEPARTMENT_OTHER): Payer: Self-pay | Admitting: Orthopaedic Surgery

## 2024-10-24 MED ORDER — TRAMADOL HCL 50 MG PO TABS: 50.0000 mg | ORAL_TABLET | Freq: Four times a day (QID) | ORAL | 0 refills | Status: DC | PRN

## 2024-10-29 ENCOUNTER — Ambulatory Visit: Admitting: Podiatry

## 2024-10-30 ENCOUNTER — Other Ambulatory Visit (HOSPITAL_BASED_OUTPATIENT_CLINIC_OR_DEPARTMENT_OTHER): Payer: Self-pay | Admitting: Orthopaedic Surgery

## 2024-10-30 ENCOUNTER — Ambulatory Visit (HOSPITAL_BASED_OUTPATIENT_CLINIC_OR_DEPARTMENT_OTHER): Payer: Self-pay | Admitting: Orthopaedic Surgery

## 2024-10-30 ENCOUNTER — Emergency Department

## 2024-10-30 ENCOUNTER — Observation Stay
Admission: EM | Admit: 2024-10-30 | Discharge: 2024-11-04 | Disposition: A | Attending: Internal Medicine | Admitting: Internal Medicine

## 2024-10-30 ENCOUNTER — Other Ambulatory Visit: Payer: Self-pay

## 2024-10-30 ENCOUNTER — Encounter (HOSPITAL_BASED_OUTPATIENT_CLINIC_OR_DEPARTMENT_OTHER): Payer: Self-pay | Admitting: Orthopaedic Surgery

## 2024-10-30 ENCOUNTER — Encounter: Admitting: Family Medicine

## 2024-10-30 DIAGNOSIS — Z8616 Personal history of COVID-19: Secondary | ICD-10-CM | POA: Insufficient documentation

## 2024-10-30 DIAGNOSIS — Z7982 Long term (current) use of aspirin: Secondary | ICD-10-CM | POA: Insufficient documentation

## 2024-10-30 DIAGNOSIS — E66813 Obesity, class 3: Secondary | ICD-10-CM | POA: Insufficient documentation

## 2024-10-30 DIAGNOSIS — F419 Anxiety disorder, unspecified: Secondary | ICD-10-CM | POA: Diagnosis present

## 2024-10-30 DIAGNOSIS — M75121 Complete rotator cuff tear or rupture of right shoulder, not specified as traumatic: Secondary | ICD-10-CM | POA: Diagnosis not present

## 2024-10-30 DIAGNOSIS — F32A Depression, unspecified: Secondary | ICD-10-CM | POA: Diagnosis not present

## 2024-10-30 DIAGNOSIS — Z952 Presence of prosthetic heart valve: Secondary | ICD-10-CM | POA: Insufficient documentation

## 2024-10-30 DIAGNOSIS — E785 Hyperlipidemia, unspecified: Secondary | ICD-10-CM

## 2024-10-30 DIAGNOSIS — Z7901 Long term (current) use of anticoagulants: Secondary | ICD-10-CM | POA: Insufficient documentation

## 2024-10-30 DIAGNOSIS — F418 Other specified anxiety disorders: Secondary | ICD-10-CM | POA: Insufficient documentation

## 2024-10-30 DIAGNOSIS — M25511 Pain in right shoulder: Secondary | ICD-10-CM | POA: Insufficient documentation

## 2024-10-30 DIAGNOSIS — K5900 Constipation, unspecified: Secondary | ICD-10-CM | POA: Insufficient documentation

## 2024-10-30 DIAGNOSIS — Z91040 Latex allergy status: Secondary | ICD-10-CM | POA: Insufficient documentation

## 2024-10-30 DIAGNOSIS — I10 Essential (primary) hypertension: Secondary | ICD-10-CM | POA: Diagnosis present

## 2024-10-30 DIAGNOSIS — Z79899 Other long term (current) drug therapy: Secondary | ICD-10-CM | POA: Insufficient documentation

## 2024-10-30 DIAGNOSIS — Q2381 Bicuspid aortic valve: Secondary | ICD-10-CM

## 2024-10-30 DIAGNOSIS — R791 Abnormal coagulation profile: Principal | ICD-10-CM

## 2024-10-30 DIAGNOSIS — J9601 Acute respiratory failure with hypoxia: Secondary | ICD-10-CM | POA: Diagnosis not present

## 2024-10-30 DIAGNOSIS — K219 Gastro-esophageal reflux disease without esophagitis: Secondary | ICD-10-CM | POA: Insufficient documentation

## 2024-10-30 DIAGNOSIS — R002 Palpitations: Secondary | ICD-10-CM

## 2024-10-30 DIAGNOSIS — R609 Edema, unspecified: Secondary | ICD-10-CM

## 2024-10-30 DIAGNOSIS — E7800 Pure hypercholesterolemia, unspecified: Secondary | ICD-10-CM

## 2024-10-30 DIAGNOSIS — I480 Paroxysmal atrial fibrillation: Secondary | ICD-10-CM | POA: Diagnosis not present

## 2024-10-30 DIAGNOSIS — Z6841 Body Mass Index (BMI) 40.0 and over, adult: Secondary | ICD-10-CM | POA: Insufficient documentation

## 2024-10-30 LAB — BASIC METABOLIC PANEL WITH GFR
Anion gap: 16 — ABNORMAL HIGH (ref 5–15)
BUN: 17 mg/dL (ref 6–20)
CO2: 20 mmol/L — ABNORMAL LOW (ref 22–32)
Calcium: 8.7 mg/dL — ABNORMAL LOW (ref 8.9–10.3)
Chloride: 104 mmol/L (ref 98–111)
Creatinine, Ser: 1.18 mg/dL — ABNORMAL HIGH (ref 0.44–1.00)
GFR, Estimated: 55 mL/min — ABNORMAL LOW
Glucose, Bld: 158 mg/dL — ABNORMAL HIGH (ref 70–99)
Potassium: 4 mmol/L (ref 3.5–5.1)
Sodium: 139 mmol/L (ref 135–145)

## 2024-10-30 LAB — CBC
HCT: 42.4 % (ref 36.0–46.0)
Hemoglobin: 13.6 g/dL (ref 12.0–15.0)
MCH: 28.6 pg (ref 26.0–34.0)
MCHC: 32.1 g/dL (ref 30.0–36.0)
MCV: 89.3 fL (ref 80.0–100.0)
Platelets: 243 10*3/uL (ref 150–400)
RBC: 4.75 MIL/uL (ref 3.87–5.11)
RDW: 15.4 % (ref 11.5–15.5)
WBC: 15.1 10*3/uL — ABNORMAL HIGH (ref 4.0–10.5)
nRBC: 0 % (ref 0.0–0.2)

## 2024-10-30 LAB — TROPONIN T, HIGH SENSITIVITY: Troponin T High Sensitivity: 10 ng/L (ref 0–19)

## 2024-10-30 LAB — PROTIME-INR
INR: 1 (ref 0.8–1.2)
Prothrombin Time: 13.9 s (ref 11.4–15.2)

## 2024-10-30 LAB — PRO BRAIN NATRIURETIC PEPTIDE: Pro Brain Natriuretic Peptide: 125 pg/mL

## 2024-10-30 MED ORDER — ONDANSETRON HCL 4 MG/2ML IJ SOLN: 4.0000 mg | Freq: Four times a day (QID) | INTRAMUSCULAR | Status: DC | PRN

## 2024-10-30 MED ORDER — TRAZODONE HCL 50 MG PO TABS
25.0000 mg | ORAL_TABLET | Freq: Every evening | ORAL | Status: DC | PRN
  Administered 2024-10-31 – 2024-11-03 (×4): 25 mg via ORAL
  Filled 2024-10-30 (×4): qty 1

## 2024-10-30 MED ORDER — FUROSEMIDE 20 MG PO TABS
20.0000 mg | ORAL_TABLET | Freq: Every day | ORAL | Status: DC
  Administered 2024-11-01 – 2024-11-04 (×4): 20 mg via ORAL
  Filled 2024-10-30 (×5): qty 1

## 2024-10-30 MED ORDER — FLUTICASONE PROPIONATE 50 MCG/ACT NA SUSP
1.0000 | Freq: Every day | NASAL | Status: DC
  Administered 2024-10-31 – 2024-11-03 (×4): 1 via NASAL
  Filled 2024-10-30 (×2): qty 16

## 2024-10-30 MED ORDER — ATORVASTATIN CALCIUM 20 MG PO TABS
10.0000 mg | ORAL_TABLET | Freq: Every day | ORAL | Status: DC
  Administered 2024-10-30 – 2024-11-04 (×6): 10 mg via ORAL
  Filled 2024-10-30 (×6): qty 1

## 2024-10-30 MED ORDER — POTASSIUM 99 MG PO TABS: 198.0000 mg | ORAL_TABLET | Freq: Every day | ORAL | Status: DC | PRN

## 2024-10-30 MED ORDER — ACETAMINOPHEN 650 MG RE SUPP: 650.0000 mg | Freq: Four times a day (QID) | RECTAL | Status: DC | PRN

## 2024-10-30 MED ORDER — HYDROCODONE-ACETAMINOPHEN 5-325 MG PO TABS
1.0000 | ORAL_TABLET | Freq: Four times a day (QID) | ORAL | Status: DC | PRN
  Administered 2024-10-31 – 2024-11-04 (×11): 1 via ORAL
  Filled 2024-10-30 (×12): qty 1

## 2024-10-30 MED ORDER — GABAPENTIN 100 MG PO CAPS
200.0000 mg | ORAL_CAPSULE | Freq: Three times a day (TID) | ORAL | Status: DC
  Administered 2024-10-30 – 2024-11-04 (×14): 200 mg via ORAL
  Filled 2024-10-30 (×14): qty 2

## 2024-10-30 MED ORDER — LORAZEPAM 2 MG/ML IJ SOLN
0.5000 mg | Freq: Once | INTRAMUSCULAR | Status: AC
  Administered 2024-10-30: 0.5 mg via INTRAVENOUS
  Filled 2024-10-30: qty 1

## 2024-10-30 MED ORDER — MAGNESIUM HYDROXIDE 400 MG/5ML PO SUSP: 30.0000 mL | Freq: Every day | ORAL | Status: DC | PRN

## 2024-10-30 MED ORDER — OXYCODONE HCL 5 MG PO TABS: 5.0000 mg | ORAL_TABLET | ORAL | Status: DC

## 2024-10-30 MED ORDER — ADULT MULTIVITAMIN W/MINERALS CH
2.0000 | ORAL_TABLET | Freq: Every morning | ORAL | Status: DC
  Administered 2024-10-31 – 2024-11-04 (×5): 2 via ORAL
  Filled 2024-10-30 (×5): qty 2

## 2024-10-30 MED ORDER — AMLODIPINE BESYLATE 5 MG PO TABS
2.5000 mg | ORAL_TABLET | Freq: Every evening | ORAL | Status: DC
  Administered 2024-10-30 – 2024-11-01 (×3): 2.5 mg via ORAL
  Filled 2024-10-30 (×3): qty 1

## 2024-10-30 MED ORDER — ACETAMINOPHEN 325 MG PO TABS
650.0000 mg | ORAL_TABLET | Freq: Four times a day (QID) | ORAL | Status: DC | PRN
  Administered 2024-11-02 – 2024-11-03 (×2): 650 mg via ORAL
  Filled 2024-10-30 (×2): qty 2

## 2024-10-30 MED ORDER — LORATADINE 10 MG PO TABS
10.0000 mg | ORAL_TABLET | Freq: Every day | ORAL | Status: DC
  Administered 2024-10-31 – 2024-11-04 (×5): 10 mg via ORAL
  Filled 2024-10-30 (×5): qty 1

## 2024-10-30 MED ORDER — ONDANSETRON HCL 4 MG PO TABS: 4.0000 mg | ORAL_TABLET | Freq: Four times a day (QID) | ORAL | Status: DC | PRN

## 2024-10-30 MED ORDER — LOPERAMIDE HCL 2 MG PO CAPS: 2.0000 mg | ORAL_CAPSULE | ORAL | Status: DC | PRN

## 2024-10-30 MED ORDER — METOPROLOL TARTRATE 25 MG PO TABS
12.5000 mg | ORAL_TABLET | Freq: Two times a day (BID) | ORAL | Status: DC
  Administered 2024-10-30 – 2024-11-02 (×6): 12.5 mg via ORAL
  Filled 2024-10-30 (×6): qty 1

## 2024-10-30 MED ORDER — MAGNESIUM OXIDE -MG SUPPLEMENT 400 (240 MG) MG PO TABS: 200.0000 mg | ORAL_TABLET | Freq: Every day | ORAL | Status: DC | PRN

## 2024-10-30 MED ORDER — WARFARIN SODIUM 5 MG PO TABS: 5.0000 mg | ORAL_TABLET | Freq: Every day | ORAL | Status: DC

## 2024-10-30 MED ORDER — SODIUM CHLORIDE 0.9 % IV SOLN: INTRAVENOUS | Status: DC

## 2024-10-30 MED ORDER — VITAMIN C 500 MG PO TABS
500.0000 mg | ORAL_TABLET | Freq: Every day | ORAL | Status: DC
  Administered 2024-10-31 – 2024-11-04 (×5): 500 mg via ORAL
  Filled 2024-10-30 (×5): qty 1

## 2024-10-30 MED ORDER — TRAMADOL HCL 50 MG PO TABS: 50.0000 mg | ORAL_TABLET | Freq: Two times a day (BID) | ORAL | 1 refills | Status: DC

## 2024-10-30 MED ORDER — IOHEXOL 350 MG/ML SOLN
100.0000 mL | Freq: Once | INTRAVENOUS | Status: AC | PRN
  Administered 2024-10-30: 100 mL via INTRAVENOUS

## 2024-10-30 MED ORDER — PANTOPRAZOLE SODIUM 40 MG PO TBEC
40.0000 mg | DELAYED_RELEASE_TABLET | Freq: Every day | ORAL | Status: DC
  Administered 2024-10-31 – 2024-11-04 (×5): 40 mg via ORAL
  Filled 2024-10-30 (×5): qty 1

## 2024-10-30 MED ORDER — ESCITALOPRAM OXALATE 10 MG PO TABS
20.0000 mg | ORAL_TABLET | Freq: Every day | ORAL | Status: DC
  Administered 2024-10-30 – 2024-11-04 (×6): 20 mg via ORAL
  Filled 2024-10-30 (×6): qty 2

## 2024-10-30 MED ORDER — OXYCODONE HCL 5 MG PO TABS: 5.0000 mg | ORAL_TABLET | ORAL | 0 refills | Status: DC | PRN

## 2024-10-30 MED ORDER — ASPIRIN 81 MG PO TBEC
81.0000 mg | DELAYED_RELEASE_TABLET | Freq: Every day | ORAL | Status: DC
  Administered 2024-10-31 – 2024-11-04 (×5): 81 mg via ORAL
  Filled 2024-10-30 (×5): qty 1

## 2024-10-30 MED ORDER — TIZANIDINE HCL 4 MG PO TABS: 4.0000 mg | ORAL_TABLET | Freq: Three times a day (TID) | ORAL | Status: DC | PRN

## 2024-10-30 MED ORDER — LORAZEPAM 1 MG PO TABS
1.0000 mg | ORAL_TABLET | Freq: Three times a day (TID) | ORAL | Status: DC | PRN
  Administered 2024-11-03: 1 mg via ORAL
  Filled 2024-10-30: qty 1

## 2024-10-30 NOTE — ED Triage Notes (Signed)
 Pt arrives to ED via [facility] c/o SOB and weakness x 1800. Pt is s/p R shoulder replacement x today. EMS reports pt 88% on RA on arrival; pt reports discontinuing coumadin  x 10/24/2024. PMHx: artificial aortic valve

## 2024-10-30 NOTE — ED Notes (Signed)
 Surigcal wound to R shoulder was reassessed. There is no more active bleeding. Shoulder and arm were cleansed w/ saline soaked gauze. Wound was dressed w/ gauze, adherent pads and secured w/ tape.

## 2024-10-30 NOTE — Progress Notes (Signed)
 "  Date of Surgery: 10/30/2024  INDICATIONS: Ms. Slone is a 54 y.o.-year-old female with right shoulder massive rotator cuff tear.  The risk and benefits of the procedure were discussed in detail and documented in the pre-operative evaluation.   PREOPERATIVE DIAGNOSIS: 1. Right shoulder massive rotator cuff tear  POSTOPERATIVE DIAGNOSIS: Same.  PROCEDURE: 1. Right shoulder reverse shoulder arthroplasty 2. Right shoulder biceps tenodesis  SURGEON: Elspeth LITTIE Parker MD  ASSISTANT: Conley Dawson, ATC  ANESTHESIA:  general  IV FLUIDS AND URINE: See anesthesia record.  ANTIBIOTICS: Ancef   ESTIMATED BLOOD LOSS: 25 mL.  IMPLANTS:  * No surgical log found *  DRAINS: None  CULTURES: None  COMPLICATIONS: none  DESCRIPTION OF PROCEDURE:   Patient was identified in the preoperative holding area.  Anesthesia performed an interscalene nerve block after universal timeout was performed with nursing.  Ancef  was given 1 hour prior to skin incision.    The surgical site was scrubbed with a chlorhexidine  scrub brush and alcohol.  The patient was then prepped with chlorhexidine  skin prep.  The patient was subsequently taken back to the operating room.  Anesthesia was induced. The patient was transferred to the beachchair position.  All bony prominences were padded.  Final timeout was again performed.     The bony landmarks of the shoulder were marked with a marking pen. A delto-pectoral incision was made, extending up approximately 5 inches. The wound with then irrigated with dilute betadine. Cephalic vein was identified, and an protected. This was retracted medially. Subdeltoid and subpectoral lesions were released. Neurovascular structures were carefully protected. The Gelpi retractor was used to retract the deltoid and pectoralis major.    The deltoid was retracted laterally with a Brown humeral retractor.  The conjoined tendon was identified. The cleido-pectoral fascia was excised.  The  axillary nerve was palpated and carefully protected throughout the procedure. The biceps tendon was found and tenodesed to the upper pec with # 2 Ethibond non-absorbable suture.  Proximally the biceps tendon was removed up to the joint.  The bicipital groove was used for a landmark to establish rotator cuff interval. The subscap was tagged with a #2 Ethibond.  At this point the subscapularis was peeled off from the lesser tuberosity with care to avoid dissection distally in order to protect the axillary nerve.  Once the joint was exposed the proximal humerus was delivered with external rotation and extension of the arm. The humerus was prepped initially by performing a humeral neck cut. This was done with the guide using 30 degrees of retroversion as a reference.  The head portion was removed.  A medullary sounding reamer was then used.  We subsequently placed our guidewire through the center of the humeral head using the reference guide.  This was a size 2.  Metaphyseal reamer was then used.  Finally the size 2 broach was malleted into place with excellent purchase.  A tonsil clamp was used to attempt to pull this out with very good purchase   Attention was then turned to the glenoid.  Posteriorly a large Darach retractor was used.  A 360 Degree release of the subscapularis and glenoid were done. The capsule was released from the humerus.    Glenoid retractors were placed posteriorly, superiorly behind the biceps tendon and anteriorly on the glenoid neck. A 360-degree release of the capsule was performed with cautery.  The triceps was released off the inferior tubercle of the glenoid. The axillary nerve was carefully protected with the surgeon's index  finger, retracting it and using cautery.   A guidepin was placed through the glenoid guide. The guidepin was drilled until it exited the cortex. The guidepin was over drilled. Next, the glenoid was prepared with the reamer down to cortical bone.  The central peg  hole was totally within the scapular neck tested with the probe.  The baseplate was then placed screwed securely with good purchase in position and then secured with 4 screws. In each case, they were drilled and measured and the appropriate length screw placed with excellent rigid fixation of the baseplate. The glenosphere was placed with size based based on pre-operative templating.   The humerus was then delivered and a neutral polyethylene trial was placed.  This was brought to just the level of the reduction but not completely reduced.  A 0 retentive final poly was selected and impacted.    Appropriate tension was noted on the conjoined tendon and deltoid muscle.  Extension was stable, external and internal rotation as well.  The subscap was pulled over but as this was not able to reach comfortably decision was made not to repair in order to prevent limited in external rotation.  The wound was then irrigated. Vancomycin  powder was placed in the wound again for infection prevention.   The wound was then closed in layers with 0 Vicryl interrupted in the deep subcu followed by 2-0 Vicryl in the superficial subcu and 3-0 nylon for skin.  An Aquacel dressing was applied as well as an Veterinary surgeon.  A shoulder immobilizer was applied.     Postoperative Plan: -The patient will begin the reverse shoulder rehab protocol  -Aspirin  325 mg daily will be used for 4 weeks for blood clot prevention -I will see the patient back in 2 weeks for first postoperative wound check    Elspeth LITTIE Parker, MD 3:38 PM    "

## 2024-10-30 NOTE — ED Provider Notes (Signed)
 "  [facility] Provider Note    Event Date/Time   First MD Initiated Contact with Patient 10/30/24 2009     (approximate)   History   Shortness of Breath   HPI  Mary Chambers is a 54 y.o. female presenting today with concern of shortness of breath.  She is postop day 0 from a right ear cuff repair done with [facility] orthopedic surgery, went home today around 6:30 PM developed increased shortness of breath and fatigue.  EMS was contacted she was found to be hypoxic and tachycardic upon their arrival and brought in on 2 L nasal cannula.  Patient generally does not use oxygen.  She has noted some increased swelling in bilateral lower extremities, she is chronically on warfarin due to a mechanical heart valve however has been off of it due to her recent surgery was scheduled to restart it tomorrow.  Denies history of PEs or DVTs, denies any associated chest pain at this time.  Tells me that surgery went well, she currently has a nerve block in her arm so has not been feeling any pain along the surgical site.  EMS did note some bleeding coming from the surgical site but due to the splint that was placed along the area, they were not able to assess further.     Physical Exam   Triage Vital Signs: ED Triage Vitals  Encounter Vitals Group     BP      Girls Systolic BP Percentile      Girls Diastolic BP Percentile      Boys Systolic BP Percentile      Boys Diastolic BP Percentile      Pulse      Resp      Temp      Temp src      SpO2      Weight      Height      Head Circumference      Peak Flow      Pain Score      Pain Loc      Pain Education      Exclude from Growth Chart     Most recent vital signs: Vitals:   10/30/24 2030 10/30/24 2300  BP: 131/84 113/82  Pulse: (!) 113 98  Resp:  18  Temp:    SpO2: 94% 98%     General: Awake, no distress.  CV:  Good peripheral perfusion.  Resp:  Normal effort.  Clear to auscultation  bilaterally Abd:  No distention. Soft nontender MSK:   Oozing bleed noted from the recent surgical site, I do not appreciate any significant induration or erythema, compartments appear to be soft good range of motion of the hand and appropriate capillary refill and pulses are noted Other:     ED Results / Procedures / Treatments   Labs (all labs ordered are listed, but only abnormal results are displayed) Labs Reviewed  CBC - Abnormal; Notable for the following components:      Result Value   WBC 15.1 (*)    All other components within normal limits  BASIC METABOLIC PANEL WITH GFR - Abnormal; Notable for the following components:   CO2 20 (*)    Glucose, Bld 158 (*)    Creatinine, Ser 1.18 (*)    Calcium  8.7 (*)    GFR, Estimated 55 (*)    Anion gap 16 (*)    All other components within normal limits  PROTIME-INR  PRO BRAIN NATRIURETIC PEPTIDE  HIV ANTIBODY (ROUTINE TESTING W REFLEX)  BASIC METABOLIC PANEL WITH GFR  CBC  PRO BRAIN NATRIURETIC PEPTIDE  TROPONIN T, HIGH SENSITIVITY     EKG  On my independent or potation of his EKG appears to be a sinus rhythm at rate of 100, axis of -18, intervals appear to be within normal limits, no obvious ischemia appreciated this EKG   RADIOLOGY   PROCEDURES:  Critical Care performed: No  Procedures   MEDICATIONS ORDERED IN ED: Medications  aspirin  EC tablet 81 mg (has no administration in time range)  HYDROcodone -acetaminophen  (NORCO/VICODIN) 5-325 MG per tablet 1 tablet (has no administration in time range)  amLODipine  (NORVASC ) tablet 2.5 mg (has no administration in time range)  atorvastatin  (LIPITOR) tablet 10 mg (has no administration in time range)  furosemide  (LASIX ) tablet 20 mg (has no administration in time range)  metoprolol  tartrate (LOPRESSOR ) tablet 12.5 mg (has no administration in time range)  escitalopram  (LEXAPRO ) tablet 20 mg (has no administration in time range)  LORazepam  (ATIVAN ) tablet 1 mg (has no  administration in time range)  loperamide  (IMODIUM ) capsule 2 mg (has no administration in time range)  pantoprazole  (PROTONIX ) EC tablet 40 mg (has no administration in time range)  warfarin (COUMADIN ) tablet 5 mg (has no administration in time range)  gabapentin  (NEURONTIN ) capsule 200 mg (has no administration in time range)  tiZANidine  (ZANAFLEX ) tablet 4 mg (has no administration in time range)  magnesium  oxide (MAG-OX) tablet 200 mg (has no administration in time range)  multivitamin with minerals tablet 2 tablet (has no administration in time range)  ascorbic acid  (VITAMIN C ) tablet 500 mg (has no administration in time range)  loratadine  (CLARITIN ) tablet 10 mg (has no administration in time range)  fluticasone  (FLONASE ) 50 MCG/ACT nasal spray 1 spray (has no administration in time range)  0.9 %  sodium chloride  infusion (has no administration in time range)  acetaminophen  (TYLENOL ) tablet 650 mg (has no administration in time range)    Or  acetaminophen  (TYLENOL ) suppository 650 mg (has no administration in time range)  traZODone  (DESYREL ) tablet 25 mg (has no administration in time range)  magnesium  hydroxide (MILK OF MAGNESIA) suspension 30 mL (has no administration in time range)  ondansetron  (ZOFRAN ) tablet 4 mg (has no administration in time range)    Or  ondansetron  (ZOFRAN ) injection 4 mg (has no administration in time range)  LORazepam  (ATIVAN ) injection 0.5 mg (0.5 mg Intravenous Given 10/30/24 2040)  iohexol  (OMNIPAQUE ) 350 MG/ML injection 100 mL (100 mLs Intravenous Contrast Given 10/30/24 2136)     IMPRESSION / MDM / ASSESSMENT AND PLAN / ED COURSE  I reviewed the triage vital signs and the nursing notes.                               Patient's presentation is most consistent with acute presentation with potential threat to life or bodily function.  54 year old female presents today with concern of shortness of breath in the setting of recent surgery done today.   Also having notable oozing bleed from the recent surgical site.  She overall appears well she is not in any acute distress on 2 L nasal cannula at this time.  Obtaining labs and will obtain CT imaging of the chest to further assess for PE or possible other emboli.  There does appear to be an oozing bleed from the recent surgical site, no palpable hematoma,  appropriate pulses in the hand, no significant induration along the area.  I will reach out to orthopedic surgery to assess if they would want any imaging of this arm at this time.  Will follow-up labs imaging and determine further workup accordingly.   Clinical Course as of 10/30/24 2327  Thu Oct 30, 2024  2110 I spoke with Dr. Vernetta who reviewed the patient's chart, seems that the patient actually had a complete shoulder replacement he suggests that often times he is patients can have continued oozing after these procedures and this may continue for a few days afterwards.  No further recommendations aside from just reinforced gauze and continue monitoring.  We are continuing the workup for PE at this time, will follow-up labs and imaging and determine further workup accordingly. [SK]  2301 Spoke with Dr. Georgian from medicine team who has agreed to assess and evaluate the patient determine course for further medical management at this time. [SK]    Clinical Course User Index [SK] Fernand Rossie HERO, MD     FINAL CLINICAL IMPRESSION(S) / ED DIAGNOSES   Final diagnoses:  Palpitations  High cholesterol  Edema, unspecified  Anxiety and depression  Bicuspid aortic valve     Rx / DC Orders   ED Discharge Orders     None        Note:  This document was prepared using Dragon voice recognition software and may include unintentional dictation errors.   Fernand Rossie HERO, MD 10/30/24 2327  "

## 2024-10-30 NOTE — ED Notes (Signed)
Pt's brief changed and pure-wick placed.

## 2024-10-30 NOTE — Progress Notes (Signed)
 PHARMACIST - PHYSICIAN ORDER COMMUNICATION  CONCERNING: P&T Medication Policy on Herbal Medications  DESCRIPTION:  This patients order for:  Potassium 198 mg tabs   has been noted.  This product(s) is classified as an herbal or natural product. Due to a lack of definitive safety studies or FDA approval, nonstandard manufacturing practices, plus the potential risk of unknown drug-drug interactions while on inpatient medications, the Pharmacy and Therapeutics Committee does not permit the use of herbal or natural products of this type within [facility].   ACTION TAKEN: The pharmacy department is unable to verify this order at this time and your patient has been informed of this safety policy. Please reevaluate patients clinical condition at discharge and address if the herbal or natural product(s) should be resumed at that time.

## 2024-10-31 ENCOUNTER — Telehealth (HOSPITAL_BASED_OUTPATIENT_CLINIC_OR_DEPARTMENT_OTHER): Payer: Self-pay | Admitting: Orthopaedic Surgery

## 2024-10-31 ENCOUNTER — Other Ambulatory Visit (HOSPITAL_BASED_OUTPATIENT_CLINIC_OR_DEPARTMENT_OTHER): Payer: Self-pay | Admitting: Orthopaedic Surgery

## 2024-10-31 DIAGNOSIS — E785 Hyperlipidemia, unspecified: Secondary | ICD-10-CM

## 2024-10-31 DIAGNOSIS — I480 Paroxysmal atrial fibrillation: Secondary | ICD-10-CM

## 2024-10-31 DIAGNOSIS — K219 Gastro-esophageal reflux disease without esophagitis: Secondary | ICD-10-CM | POA: Insufficient documentation

## 2024-10-31 LAB — BASIC METABOLIC PANEL WITH GFR
Anion gap: 9 (ref 5–15)
BUN: 15 mg/dL (ref 6–20)
CO2: 23 mmol/L (ref 22–32)
Calcium: 8.3 mg/dL — ABNORMAL LOW (ref 8.9–10.3)
Chloride: 108 mmol/L (ref 98–111)
Creatinine, Ser: 0.92 mg/dL (ref 0.44–1.00)
GFR, Estimated: >60 mL/min
Glucose, Bld: 127 mg/dL — ABNORMAL HIGH (ref 70–99)
Potassium: 5.1 mmol/L (ref 3.5–5.1)
Sodium: 140 mmol/L (ref 135–145)

## 2024-10-31 LAB — CBC
HCT: 36.7 % (ref 36.0–46.0)
Hemoglobin: 11.7 g/dL — ABNORMAL LOW (ref 12.0–15.0)
MCH: 28.3 pg (ref 26.0–34.0)
MCHC: 31.9 g/dL (ref 30.0–36.0)
MCV: 88.9 fL (ref 80.0–100.0)
Platelets: 206 10*3/uL (ref 150–400)
RBC: 4.13 MIL/uL (ref 3.87–5.11)
RDW: 15.7 % — ABNORMAL HIGH (ref 11.5–15.5)
WBC: 10.4 10*3/uL (ref 4.0–10.5)
nRBC: 0 % (ref 0.0–0.2)

## 2024-10-31 LAB — HIV ANTIBODY (ROUTINE TESTING W REFLEX): HIV Screen 4th Generation wRfx: NONREACTIVE

## 2024-10-31 LAB — HEPARIN LEVEL (UNFRACTIONATED)
Heparin Unfractionated: >1.1 [IU]/mL — ABNORMAL HIGH (ref 0.30–0.70)
Heparin Unfractionated: 0.21 [IU]/mL — ABNORMAL LOW (ref 0.30–0.70)

## 2024-10-31 LAB — PROTIME-INR
INR: 1.1 (ref 0.8–1.2)
Prothrombin Time: 14.7 s (ref 11.4–15.2)

## 2024-10-31 LAB — PRO BRAIN NATRIURETIC PEPTIDE: Pro Brain Natriuretic Peptide: 119 pg/mL

## 2024-10-31 MED ORDER — WARFARIN SODIUM 7.5 MG PO TABS
7.5000 mg | ORAL_TABLET | Freq: Every day | ORAL | Status: DC
  Administered 2024-10-31: 7.5 mg via ORAL
  Filled 2024-10-31 (×2): qty 1

## 2024-10-31 MED ORDER — TRAMADOL HCL 50 MG PO TABS: 50.0000 mg | ORAL_TABLET | Freq: Four times a day (QID) | ORAL | 0 refills | Status: DC | PRN

## 2024-10-31 MED ORDER — WARFARIN SODIUM 5 MG PO TABS
5.0000 mg | ORAL_TABLET | Freq: Once | ORAL | Status: AC
  Administered 2024-10-31: 5 mg via ORAL

## 2024-10-31 MED ORDER — WARFARIN - PHARMACIST DOSING INPATIENT
Freq: Every day | Status: DC
  Filled 2024-10-31: qty 1

## 2024-10-31 MED ORDER — HEPARIN BOLUS VIA INFUSION
5000.0000 [IU] | Freq: Once | INTRAVENOUS | Status: AC
  Administered 2024-10-31: 5000 [IU] via INTRAVENOUS
  Filled 2024-10-31: qty 5000

## 2024-10-31 MED ORDER — WARFARIN SODIUM 2.5 MG PO TABS
2.5000 mg | ORAL_TABLET | Freq: Once | ORAL | Status: AC
  Administered 2024-10-31: 2.5 mg via ORAL
  Filled 2024-10-31: qty 1

## 2024-10-31 MED ORDER — HEPARIN BOLUS VIA INFUSION
1400.0000 [IU] | Freq: Once | INTRAVENOUS | Status: AC
  Administered 2024-10-31: 1400 [IU] via INTRAVENOUS
  Filled 2024-10-31: qty 1400

## 2024-10-31 MED ORDER — WARFARIN SODIUM 5 MG PO TABS: 5.0000 mg | ORAL_TABLET | Freq: Every day | ORAL | Status: DC

## 2024-10-31 MED ORDER — HEPARIN (PORCINE) 25000 UT/250ML-% IV SOLN
1550.0000 [IU]/h | INTRAVENOUS | Status: DC
  Administered 2024-10-31: 1000 [IU]/h via INTRAVENOUS
  Administered 2024-11-02: 1350 [IU]/h via INTRAVENOUS
  Administered 2024-11-03: 1550 [IU]/h via INTRAVENOUS
  Filled 2024-10-31 (×6): qty 250

## 2024-10-31 MED ORDER — HEPARIN (PORCINE) 25000 UT/250ML-% IV SOLN
1350.0000 [IU]/h | INTRAVENOUS | Status: DC
  Administered 2024-10-31: 1350 [IU]/h via INTRAVENOUS
  Filled 2024-10-31: qty 250

## 2024-10-31 MED ORDER — WARFARIN SODIUM 7.5 MG PO TABS
7.5000 mg | ORAL_TABLET | Freq: Once | ORAL | Status: DC
  Filled 2024-10-31: qty 1

## 2024-10-31 NOTE — Hospital Course (Addendum)
 54 y.o. Caucasian female with medical history significant for anxiety, depression, GERD, essential hypertension, OSA, Aortic valve replacement on Coumadin  who underwent right shoulder surgery for rotator cuff tear repair on 10/30/24 in [place] at the specialty center and around 6:30 PM started having dyspnea and fatigue.  She was found to be hypoxic and tachycardic upon arrival here in was brought to the emergency room on 2 L of O2 by nasal cannula.  She is not on any home O2.  She denies any history of COPD or interstitial lung disease or PE or DVT.   Work up in ED- 95 % on 2L, BNP-125, Trop- neg, WBC-15, CTA chest- no PE, Dilated ascending aorta measuring 4.3 cm, slightly larger than on the prior exam, without dissection.Recommend annual imaging followup by CTA or MRA.  1/30.  Subtherapeutic INR at 1.1 on heparin  drip and Coumadin  1/31.  Subtherapeutic INR at 1.2 on heparin  drip and Coumadin  2/1.  INR subtherapeutic at 1.3 2/2.  patient's pulse ox dropped down to 88% with moving around.  INR 1.3.  PT recommending rehab 2/3.  Patient able to hold her pulse ox with physical therapy.  Did better with physical therapy today and family comfortable taking home.  Patient has pain medications at home.

## 2024-10-31 NOTE — Progress Notes (Signed)
 PHARMACY - ANTICOAGULATION CONSULT NOTE  Pharmacy Consult for Warfarin, Heparin   Indication: atrial fibrillation and mechanical valve (bridging with heparin  until INR therapeutic)   Allergies[1]  Patient Measurements: Height: 5' 7 (170.2 cm) Weight: (!) 141.5 kg (311 lb 15.2 oz) IBW/kg (Calculated) : 61.6 HEPARIN  DW (KG): 96.3  Vital Signs: Temp: 98.1 F (36.7 C) (01/30 0116) Temp Source: Oral (01/30 0116) BP: 118/74 (01/30 0100) Pulse Rate: 93 (01/30 0100)  Labs: Recent Labs    10/30/24 2018  HGB 13.6  HCT 42.4  PLT 243  LABPROT 13.9  INR 1.0  CREATININE 1.18*    Estimated Creatinine Clearance: 80.5 mL/min (A) (by C-G formula based on SCr of 1.18 mg/dL (H)).   Medical History: Past Medical History:  Diagnosis Date   Abscess 06/23/2021   Achilles tendonitis 01/16/2014   Overview:   Followed by [employer].  Treated with brace and diclofenac .   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  Follow-up with Dr. Zena.  Watch for opportunities to increase physical activity within neuromuscular capabilities.   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  Pt has hx of aortic valve replacement and Afib.   Pt was on warfarin 5 mg PO daily PTA but stopped taking on 1/23 in preparation for surgery on 1/28.  Pt was advised to resume warfarin on 1/29 at 1.5 times daily dose X 2 days then with normal dose of 5 mg PO daily.     1/29 @ 2018:  INR = 1.0, SUBtherapeutic  CrCl = 80.5 ml/min.   Goal of Therapy:  INR 2-3 Heparin  level 0.3-0.7 units/ml Monitor platelets by anticoagulation protocol: Yes   Plan:  - Will order warfarin 7.5 mg PO daily X 2 days starting on 1/30 @ ~ 0130 followed by warfarin 5 mg PO daily per instructions from outpt warfarin clinic.  - INR daily   - Will order heparin  5000 units IV X 1 bolus followed by heparin  drip to start @ 1350 units/hr - Check HL 6 hrs after start of drip - CBC daily  - will d/c heparin  bridge once INR is therapeutic X 2   Joel Mericle D 10/31/2024,1:25 AM      [1]  Allergies Allergen Reactions   Sulfur Itching   Baclofen Hives   Dilaudid  [Hydromorphone ] Itching   Latex  When inside the body it causes irritation  Other Reaction(s): Not available  latex   Oxycodone -Acetaminophen  Hives   Semaglutide  Nausea And Vomiting   Septra [Sulfamethoxazole-Trimethoprim] Hives   Sulfa Antibiotics Swelling   Vibramycin [Doxycycline] Itching   Gabapentin  Rash    Low dose okay per patient.

## 2024-10-31 NOTE — Assessment & Plan Note (Signed)
 On Lipitor

## 2024-10-31 NOTE — Telephone Encounter (Signed)
 Post op

## 2024-10-31 NOTE — Assessment & Plan Note (Signed)
-   On Lexapro

## 2024-10-31 NOTE — Assessment & Plan Note (Signed)
 On Protonix 

## 2024-10-31 NOTE — Progress Notes (Signed)
 PHARMACY - ANTICOAGULATION CONSULT NOTE  Pharmacy Consult for Warfarin, Heparin   Indication: atrial fibrillation and mechanical valve (bridging with heparin  until INR therapeutic)   Allergies[1]  Patient Measurements: Height: 5' 7 (170.2 cm) Weight: (!) 141.5 kg (311 lb 15.2 oz) IBW/kg (Calculated) : 61.6 HEPARIN  DW (KG): 96.3  Vital Signs: Temp: 98.1 F (36.7 C) (01/30 0922) Temp Source: Oral (01/30 0500) BP: 139/72 (01/30 1430) Pulse Rate: 81 (01/30 1459)  Labs: Recent Labs    10/30/24 2018 10/31/24 0421 10/31/24 0550 10/31/24 1000  HGB 13.6  --  11.7*  --   HCT 42.4  --  36.7  --   PLT 243  --  206  --   LABPROT 13.9 14.7  --   --   INR 1.0 1.1  --   --   HEPARINUNFRC  --   --   --  >1.10*  CREATININE 1.18* 0.92  --   --     Estimated Creatinine Clearance: 103.3 mL/min (by C-G formula based on SCr of 0.92 mg/dL).   Medical History: Past Medical History:  Diagnosis Date   Abscess 06/23/2021   Achilles tendonitis 01/16/2014   Overview:   Followed by [employer].  Treated with brace and diclofenac .   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  Follow-up with Dr. Zena.  Watch for opportunities to increase physical activity within neuromuscular capabilities.   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  Pt has hx of aortic valve replacement and Afib.   Pt was on warfarin 5 mg PO daily PTA but stopped taking on 1/23 in preparation for surgery on 1/28.  Pt was advised to resume warfarin on 1/29 at 1.5 times daily dose X 2 days then with normal dose of 5 mg PO daily.     1/29 @ 2018:  INR = 1.0, SUBtherapeutic  CrCl = 80.5 ml/min.   Goal of Therapy:  INR 2-3 Heparin  level 0.3-0.7 units/ml Monitor platelets by anticoagulation protocol: Yes  Date: INR: Dose: 1/30 1.0 7.5mg (given~0200)   Date/Time: HL: Comment/Rate: 1/30@1000  >1.1 Supratherapeutic@1000  units/hr  Plan:  Warfarin: - Will order warfarin 7.5 mg PO daily X 2 days starting on 1/30 @ ~ 0130 followed by warfarin 5 mg PO daily per instructions from outpt warfarin clinic.  - INR daily    Heparin : -  Will hold heparin  infusion for 1 hour, and restart at lower rate of 1000 units/hr - Check HL 6 hrs after rate change - CBC daily  - will d/c heparin  bridge once INR is therapeutic X 2   Cardin Nitschke A Latiya Navia, PharmD Clinical Pharmacist 10/31/2024 3:14 PM        [1]  Allergies Allergen Reactions   Sulfur Itching   Baclofen Hives   Dilaudid  [Hydromorphone ] Itching   Latex     When inside the body it causes irritation  Other Reaction(s): Not available  latex   Oxycodone -Acetaminophen  Hives   Semaglutide  Nausea And Vomiting   Septra [Sulfamethoxazole-Trimethoprim] Hives   Sulfa Antibiotics Swelling   Vibramycin [Doxycycline] Itching   Gabapentin  Rash    Low dose okay per patient.

## 2024-10-31 NOTE — H&P (Signed)
 "        PATIENT NAME: Mary Chambers    MR#:  [id number]  DATE OF BIRTH:  [birth year]  DATE OF ADMISSION:  10/30/2024  PRIMARY CARE PHYSICIAN: Lendia Boby CROME, NP-C   Patient is coming from: Home  REQUESTING/REFERRING PHYSICIAN: Fernand Rossie HERO, MD    CHIEF COMPLAINT:   Chief Complaint  Patient presents with   Shortness of Breath    HISTORY OF PRESENT ILLNESS:  Mary Chambers is a 54 y.o. Caucasian female with medical history significant for anxiety, depression, GERD, essential hypertension, OSA, who underwent right shoulder surgery for rotator cuff tear repair today in [place] at the specialty center and around 6:30 PM started having dyspnea and fatigue.  She was found to be hypoxic and tachycardic upon arrival here in was brought to the emergency room on 2 L of O2 by nasal cannula.  She is not on any home O2.  She denies any history of COPD or interstitial lung disease or PE or DVT.  No fever or chills.  She has been having mild cough without wheezing.  No chest pain or palpitations.  No dysuria, oliguria or hematuria or flank pain.  No headache or dizziness or blurred vision.  ED Course: When the patient came to the ER, pulse oximetry was 94% on 2 L O2 by nasal cannula, heart rate was 113 with otherwise normal vital signs.  BMP revealed CO2 of 20 glucose 150 and calcium  8.7 anion gap 16.  proBNP was 125 and high-sensitivity troponin T was 10.  CBC showed leukocytosis 15.1.  PT and INR were normal EKG as reviewed by me : EKG showed sinus tachycardia with rate 103 with left atrial enlargement and LVH. Imaging: Chest CTA revealed the following: 1. No pulmonary embolism. 2. Dilated ascending aorta measuring 4.3 cm, slightly larger than on the prior exam, without dissection.Recommend annual imaging followup by CTA or MRA.  The patient was given 0.5 mg of IV Ativan .  She will be admitted to a telemetry observation bed for further evaluation and management.  PAST  MEDICAL HISTORY:   Past Medical History:  Diagnosis Date   Abscess 06/23/2021   Achilles tendonitis 01/16/2014   Overview:   Followed by [employer].  Treated with brace and diclofenac .   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  Follow-up with Dr. Zena.  Watch for opportunities to increase physical activity within neuromuscular capabilities.   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   Transaminitis 06/23/2021   Urinary incontinence 06/03/2010   Vertebral osteomyelitis (HCC)     PAST  SURGICAL HISTORY:   Past Surgical History:  Procedure Laterality Date   ABDOMINAL HYSTERECTOMY     AORTIC VALVE REPLACEMENT N/A 11/07/2021   Procedure: AORTIC VALVE REPLACEMENT USING A On-X AORTIC VALVE.;  Surgeon: Shyrl Linnie KIDD, MD;  Location: [facility] OR;  Service: Open Heart Surgery;  Laterality: N/A;   BACK SURGERY     BUBBLE STUDY  08/31/2021   Procedure: BUBBLE STUDY;  Surgeon: Hobart Powell BRAVO, MD;  Location: [facility] ENDOSCOPY;  Service: Cardiovascular;;   CARDIAC VALVE REPLACEMENT  11/07/2021   CARPAL TUNNEL RELEASE Bilateral    CHOLECYSTECTOMY     HERNIA REPAIR     INGUINAL HERNIA REPAIR Left  02/29/2024   Procedure: REPAIR, HERNIA, INGUINAL, LAPAROSCOPIC;  Surgeon: Lyndel Deward PARAS, MD;  Location: [facility] ORS;  Service: General;  Laterality: Left;  LATERALITY: LEFT   IR FLUORO GUIDED NEEDLE PLC ASPIRATION/INJECTION LOC  06/23/2021   LITHOTRIPSY     neck fusion     RIGHT/LEFT HEART CATH AND CORONARY ANGIOGRAPHY N/A 10/07/2021   Procedure: RIGHT/LEFT HEART CATH AND CORONARY ANGIOGRAPHY;  Surgeon: Dann Candyce RAMAN, MD;  Location: [facility] INVASIVE CV LAB;  Service: Cardiovascular;  Laterality: N/A;   SPINE SURGERY     TEE WITHOUT CARDIOVERSION N/A 06/28/2021   Procedure: TRANSESOPHAGEAL ECHOCARDIOGRAM (TEE);  Surgeon: Pietro Redell RAMAN, MD;  Location: [facility] ENDOSCOPY;  Service: Cardiovascular;  Laterality: N/A;   TEE WITHOUT CARDIOVERSION N/A 08/31/2021   Procedure: TRANSESOPHAGEAL ECHOCARDIOGRAM (TEE);  Surgeon: Hobart Powell BRAVO, MD;  Location: [facility] ENDOSCOPY;  Service: Cardiovascular;  Laterality: N/A;   TEE WITHOUT CARDIOVERSION N/A 11/07/2021   Procedure: TRANSESOPHAGEAL ECHOCARDIOGRAM (TEE);  Surgeon: Shyrl Linnie KIDD, MD;  Location: [facility] OR;  Service: Open Heart Surgery;  Laterality: N/A;   TUBAL LIGATION     UMBILICAL HERNIA REPAIR N/A 02/29/2024   Procedure: REPAIR, HERNIA, UMBILICAL, ADULT;  Surgeon: Lyndel Deward PARAS, MD;  Location: [facility] ORS;  Service: General;  Laterality: N/A;    SOCIAL HISTORY:   Social History   Tobacco Use   Smoking status: Never    Passive exposure: Never   Smokeless tobacco: Never  Substance Use Topics   Alcohol use: No    FAMILY HISTORY:   Family History  Problem Relation Age of Onset   Diabetes Mother    Hypertension Mother    Arthritis Mother    Depression Mother    Obesity Mother    Varicose Veins Mother    Cancer Father    Parkinson's disease Father    Heart disease Father    Heart disease Sister    Heart disease Brother    Obesity Brother    Diabetes Maternal Grandmother    Hypertension Sister    Obesity Sister    ADD /  ADHD Daughter    ADD / ADHD Son    Colon cancer Neg Hx    Rectal cancer Neg Hx    Stomach cancer Neg Hx     DRUG ALLERGIES:  Allergies[1]  REVIEW OF SYSTEMS:   ROS As per history of present illness. All pertinent systems were reviewed above. Constitutional, HEENT, cardiovascular, respiratory, GI, GU, musculoskeletal, neuro, psychiatric, endocrine, integumentary and hematologic systems were reviewed and are otherwise negative/unremarkable except for positive findings mentioned above in the HPI.   MEDICATIONS AT HOME:   Prior to Admission medications  Medication Sig Start Date End Date Taking? Authorizing Provider  amLODipine  (NORVASC ) 2.5 MG tablet TAKE 1 TABLET BY MOUTH EVERY DAY 10/10/24  Yes Lonni Slain, MD  amoxicillin  (  AMOXIL ) 500 MG capsule Take 2,000 mg by mouth as needed. Take once as needed prior to dental appointments.   Yes [provider]  aspirin  EC 81 MG tablet Take 1 tablet (81 mg total) by mouth daily. Swallow whole. 08/15/22  Yes Lonni Slain, MD  atorvastatin  (LIPITOR) 10 MG tablet TAKE 1 TABLET BY MOUTH EVERY DAY IN THE EVENING 07/22/24  Yes Lonni Slain, MD  cetirizine  (ZYRTEC ) 10 MG tablet Take 10 mg by mouth at bedtime.   Yes [provider]  ELDERBERRY PO Take 50 mg by mouth daily.   Yes [provider]  escitalopram  (LEXAPRO ) 20 MG tablet TAKE 1 TABLET BY MOUTH EVERY DAY 07/18/24  Yes de [place], Raymond J, MD  fluticasone  (FLONASE ) 50 MCG/ACT nasal spray SPRAY 2 SPRAYS INTO EACH NOSTRIL EVERY DAY 05/19/24  Yes de [place], Raymond J, MD  furosemide  (LASIX ) 20 MG tablet Take 1 tablet (20 mg total) by mouth daily. 08/15/22  Yes Lonni Slain, MD  gabapentin  (NEURONTIN ) 100 MG capsule Take 200 mg by mouth 3 (three) times daily.   Yes [provider]  HYDROcodone -acetaminophen  (NORCO/VICODIN) 5-325 MG tablet Take 1 tablet by mouth every 6 (six) hours as needed for moderate pain (pain score 4-6).  09/05/24  Yes Genelle Standing, MD  loperamide  (IMODIUM ) 2 MG capsule Take 1 capsule (2 mg total) by mouth as needed for diarrhea or loose stools. 07/01/21  Yes Samtani, Jai-Gurmukh, MD  LORazepam  (ATIVAN ) 1 MG tablet Take 1 tablet (1 mg total) by mouth every 8 (eight) hours. Patient taking differently: Take 1 mg by mouth every 8 (eight) hours as needed for anxiety or sedation. 07/30/24  Yes Genelle Standing, MD  Magnesium  250 MG CAPS Take 250 mg by mouth daily as needed (feet burning).   Yes [provider]  metoprolol  tartrate (LOPRESSOR ) 25 MG tablet Take 0.5 tablets (12.5 mg total) by mouth 2 (two) times daily. 07/29/24  Yes Lonni Slain, MD  Multiple Vitamin (MULTIVITAMIN WITH MINERALS) TABS tablet Take 2 tablets by mouth in the morning.   Yes [provider]  naproxen  (NAPROSYN ) 250 MG tablet Take 250 mg by mouth 2 (two) times daily. 04/03/24  Yes [provider]  oxyCODONE  (OXY IR/ROXICODONE ) 5 MG immediate release tablet Take 5 mg by mouth as directed. 10/30/24  Yes [provider]  pantoprazole  (PROTONIX ) 40 MG tablet TAKE 1 TABLET BY MOUTH EVERY DAY 03/20/24  Yes de [place], Raymond J, MD  Potassium 99 MG TABS Take 198 mg by mouth daily as needed (when taking lasix ).   Yes [provider]  tirzepatide  (ZEPBOUND ) 10 MG/0.5ML Pen Inject 10 mg into the skin once a week. 10/22/24  Yes Lonni Slain, MD  tirzepatide  (ZEPBOUND ) 12.5 MG/0.5ML Pen Inject 12.5 mg into the skin once a week. 11/22/24  Yes Lonni Slain, MD  tirzepatide  (ZEPBOUND ) 15 MG/0.5ML Pen Inject 15 mg into the skin once a week. 12/20/24  Yes Lonni Slain, MD  tiZANidine  (ZANAFLEX ) 4 MG tablet Take 4 mg by mouth every 8 (eight) hours as needed. 10/23/24  Yes [provider]  traMADol  (ULTRAM ) 50 MG tablet Take 1 tablet (50 mg total) by mouth every 6 (six) hours as needed. 10/24/24  Yes Genelle Standing, MD  traMADol  (ULTRAM ) 50 MG tablet Take 1 tablet (50  mg total) by mouth 2 (two) times daily. 10/30/24  Yes Genelle Standing, MD  vitamin C  (ASCORBIC ACID ) 250 MG tablet Take 500 mg by mouth daily.   Yes [provider]  warfarin (COUMADIN ) 5 MG tablet TAKE 1/2 TO 1 TABLET DAILY OR AS DIRECTED BY COUMADIN  CLINIC 03/19/24  Yes Lonni Slain, MD  aspirin  325 MG tablet Take 325 mg by mouth daily. Patient not taking: Reported on 10/30/2024 10/30/24 11/27/24  [provider]      VITAL SIGNS:  Blood pressure 118/74, pulse 93, temperature 98.1 F (36.7 C), temperature source Oral, resp. rate 18, height 5' 7 (1.702 m), weight (!) 141.5 kg, SpO2 99%.  PHYSICAL EXAMINATION:  Physical Exam  GENERAL:  54 y.o.-year-old Caucasian female patient lying in the bed with no acute distress.  EYES: Pupils equal, round, reactive to light and accommodation. No scleral icterus. Extraocular muscles intact.  HEENT: Head atraumatic, normocephalic. Oropharynx and nasopharynx clear.  NECK:  Supple, no jugular venous distention. No thyroid enlargement, no tenderness.  LUNGS: Normal breath sounds bilaterally, no wheezing, rales,rhonchi or crepitation. No use of accessory muscles of respiration.  CARDIOVASCULAR: Regular rate and rhythm, S1, S2 normal. No murmurs, rubs, or gallops.  ABDOMEN: Soft, nondistended, nontender. Bowel sounds present. No organomegaly or mass.  EXTREMITIES: No pedal edema, cyanosis, or clubbing.  NEUROLOGIC: Cranial nerves II through XII are intact. Muscle strength 5/5 in all extremities. Sensation intact. Gait not checked. Musculoskeletal: Right shoulder in sling. PSYCHIATRIC: The patient is alert and oriented x 3.  Normal affect and good eye contact. SKIN: No obvious rash, lesion, or ulcer.   LABORATORY PANEL:   CBC Recent Labs  Lab 10/30/24 2018  WBC 15.1*  HGB 13.6  HCT 42.4  PLT 243    ------------------------------------------------------------------------------------------------------------------  Chemistries  Recent Labs  Lab 10/30/24 2018  NA 139  K 4.0  CL 104  CO2 20*  GLUCOSE 158*  BUN 17  CREATININE 1.18*  CALCIUM  8.7*   ------------------------------------------------------------------------------------------------------------------  Cardiac Enzymes No results for input(s): TROPONINI in the last 168 hours. ------------------------------------------------------------------------------------------------------------------  RADIOLOGY:  CT Angio Chest PE W and/or Wo Contrast Result Date: 10/30/2024 EXAM: CTA of the Chest with contrast for PE 10/30/2024 09:49:43 PM TECHNIQUE: CTA of the chest was performed after the administration of intravenous contrast. Multiplanar reformatted images are provided for review. MIP images are provided for review. Automated exposure control, iterative reconstruction, and/or weight based adjustment of the mA/kV was utilized to reduce the radiation dose to as low as reasonably achievable. COMPARISON: 07/15/2021 CLINICAL HISTORY: Weakness. FINDINGS: PULMONARY ARTERIES: No pulmonary embolism. Main pulmonary artery is normal in caliber. The pulmonary artery shows a normal branching pattern bilaterally. MEDIASTINUM: The heart and pericardium demonstrate no acute abnormality. The aorta shows a normal branching pattern. Atherosclerotic calcifications are noted. The ascending aorta is dilated to 4.3 cm, slightly larger than that seen on the prior exam, at which time it measured 4.1 cm. No dissection is noted. Normal tapering in the aortic arch is seen. The esophagus is within normal limits. No hilar or mediastinal abnormality is noted. LYMPH NODES: No mediastinal, hilar or axillary lymphadenopathy. LUNGS AND PLEURA: The lungs are well aerated bilaterally. No focal infiltrate or effusion is seen. Mild scarring in the left base is noted. No  pneumothorax. UPPER ABDOMEN: The visualized upper abdomen shows changes of prior cholecystectomy. SOFT TISSUES AND BONES: Postsurgical changes are noted in the lower thoracic spine and right shoulder. No acute bony abnormality is seen. No acute soft tissue abnormality. IMPRESSION: 1. No pulmonary embolism. 2. Dilated ascending aorta measuring 4.3 cm, slightly larger than on the prior exam, without dissection.Recommend annual imaging followup by CTA or MRA. This recommendation follows 2010 ACCF/AHA/AATS/ACR/ASA/SCA/SCAI/SIR/STS/SVM Guidelines  for the Diagnosis and Management of Patients with Thoracic Aortic Disease. Circulation. 2010; 121: Z733-z630. Aortic aneurysm NOS (ICD10-I71.9) Electronically signed by: Oneil Devonshire MD 10/30/2024 09:59 PM EST RP Workstation: [id number]      IMPRESSION AND PLAN:  Assessment and Plan: * Acute respiratory failure with hypoxia (HCC) - The patient will be admitted to a medical telemetry observation bed. - Will place her on O2 protocol.  Ruled out PE. - Differential diagnosis may include aspiration pneumonia with associated current dehydration as she has not eaten anything from the morning. - Other possibilities could be developing interstitial lung disease though this is not clear on her chest x-ray.  She does not show signs of fluid overload and her proBNP was normal. - We will obtain a 2D echo for further assessment.  Paroxysmal atrial fibrillation (HCC) - Will continue Lopressor  and Coumadin .  Dyslipidemia - Will  continue statin therapy.  Anxiety and depression - Will continue Ativan  and Lexapro .  GERD without esophagitis - Will continue PPI therapy   DVT prophylaxis: Lovenox .  Advanced Care Planning:  Code Status: full code.  Family Communication:  The plan of care was discussed in details with the patient (and family). I answered all questions. The patient agreed to proceed with the above mentioned plan. Further management will depend upon hospital  course. Disposition Plan: Back to previous home environment Consults called: none.  All the records are reviewed and case discussed with ED provider.  Status is: Observation  I certify that at the time of admission, it is my clinical judgment that the patient will require hospital care extending less than 2 midnights.                            Dispo: The patient is from: Home              Anticipated d/c is to: Home              Patient currently is not medically stable to d/c.              Difficult to place patient: No  [place] DELENA Peaches M.D on 10/31/2024 at 3:07 AM  [facility]   From 7 PM-7 AM, contact night-coverage [email or web address]  CC: Primary care physician; Lendia Boby CROME, NP-C     [1]  Allergies Allergen Reactions   Sulfur Itching   Baclofen Hives   Dilaudid  [Hydromorphone ] Itching   Latex     When inside the body it causes irritation  Other Reaction(s): Not available  latex   Oxycodone -Acetaminophen  Hives   Semaglutide  Nausea And Vomiting   Septra [Sulfamethoxazole-Trimethoprim] Hives   Sulfa Antibiotics Swelling   Vibramycin [Doxycycline] Itching   Gabapentin  Rash    Low dose okay per patient.   "

## 2024-10-31 NOTE — Progress Notes (Signed)
 Patient arrived to floor alert and oriented, on stretcher from the ED, assisted to bed and made comfortable. Tele attached, iv heparin  infusing. No complaint of pain or discomfort. Side table in reach and call bell

## 2024-10-31 NOTE — Plan of Care (Signed)
  Problem: Education: Goal: Knowledge of General Education information will improve Description: Including pain rating scale, medication(s)/side effects and non-pharmacologic comfort measures Outcome: Progressing   Problem: Clinical Measurements: Goal: Ability to maintain clinical measurements within normal limits will improve Outcome: Progressing   Problem: Activity: Goal: Risk for activity intolerance will decrease Outcome: Progressing   Problem: Nutrition: Goal: Adequate nutrition will be maintained Outcome: Progressing   Problem: Coping: Goal: Level of anxiety will decrease Outcome: Progressing   Problem: Elimination: Goal: Will not experience complications related to bowel motility Outcome: Progressing   Problem: Pain Managment: Goal: General experience of comfort will improve and/or be controlled Outcome: Progressing   Problem: Safety: Goal: Ability to remain free from injury will improve Outcome: Progressing   Problem: Skin Integrity: Goal: Risk for impaired skin integrity will decrease Outcome: Progressing

## 2024-10-31 NOTE — Assessment & Plan Note (Addendum)
 Patient was on oxygen again when I saw her this morning.  Had nursing staff move around while on oxygen pulse ox dropped to 88%.  Patient does not want to go home on oxygen.  Continue incentive spirometry.  Sit in chair.  Will get CPAP at night.

## 2024-10-31 NOTE — Progress Notes (Addendum)
" °  Progress Note   Patient: Mary Chambers FMW:[id number] DOB: 1970-12-27 DOA: 10/30/2024     0 DOS: the patient was seen and examined on 10/31/2024   Brief hospital course: 54 y.o. Caucasian female with medical history significant for anxiety, depression, GERD, essential hypertension, OSA, who underwent right shoulder surgery for rotator cuff tear repair on 10/30/24 in [place] at the specialty center and around 6:30 PM started having dyspnea and fatigue.  She was found to be hypoxic and tachycardic upon arrival here in was brought to the emergency room on 2 L of O2 by nasal cannula.  She is not on any home O2.  She denies any history of COPD or interstitial lung disease or PE or DVT.   Work up in ED- 9$ % on 2L, BNP-125, Trop- neg, WBC-15, CTA chest- no PE, Dilated ascending aorta measuring 4.3 cm, slightly larger than on the prior exam, without dissection.Recommend annual imaging followup by CTA or MRA.  Assessment and Plan: Acute respiratory failure with hypoxia resolved, Likely 2/2 atelectasis  Hx OSA  - The patient will be admitted to a medical telemetry observation bed. - Will place her on O2 protocol.  Ruled out PE. - CT with no evidence of PNA.  - She does not show signs of fluid overload and her proBNP was normal. _ Weaned off oxygen this morning, Most likely atelectasis.  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  May need to be hospitalized until INR therapeutic   Dilated ascending Aorta, 4.3 cm Recommend annual imaging follow up with CTA or MRA  S/p Right shoulder rotator cuff repair 10/30/24 Continue sling, follow up with ortho as outpatient, Prn analgesia.  Continue post op instructions per ortho.   Paroxysmal atrial fibrillation - Continue Lopressor  and Coumadin .  Dyslipidemia -  Continue statin therapy.  Anxiety and depression - Continue Ativan  and Lexapro .  GERD without esophagitis - Continue PPI therapy     Subjective:   Feels better this morning. Denies any breathing problems Reports occasional dry cough. No chest pain or dizziness   Physical Exam: Vitals:   10/31/24 1200 10/31/24 1230 10/31/24 1245 10/31/24 1300  BP: (!) 145/76 (!) 148/72  (!) 142/74  Pulse: 96 89 95 86  Resp: 17 12 17    Temp:      TempSrc:      SpO2: 93%  95% 95%  Weight:      Height:       Gen- No acute distress, Obese Lungs- diminished BS, no added sounds Heart- RRR, no murmurs Ext- No edema noted, Right shoulder in a sling.   Data Reviewed:  There are no new results to review at this time.  Family Communication: Discussed with patient   Disposition: Status is: Observation The patient remains OBS appropriate and will d/c before 2 midnights.  Planned Discharge Destination: Home    Time spent: 40 minutes  Author: Genell JONELLE Overcast, MD 10/31/2024 1:44 PM  For on call review [email or web address].  "

## 2024-10-31 NOTE — Progress Notes (Signed)
 PHARMACY - ANTICOAGULATION CONSULT NOTE  Pharmacy Consult for Warfarin, Heparin   Indication: atrial fibrillation and mechanical valve (bridging with heparin  until INR therapeutic)   Allergies[1]  Patient Measurements: Height: 5' 7 (170.2 cm) Weight: (!) 141.5 kg (311 lb 15.2 oz) IBW/kg (Calculated) : 61.6 HEPARIN  DW (KG): 96.3  Vital Signs: Temp: 98.4 F (36.9 C) (01/30 2024) Temp Source: Oral (01/30 2024) BP: 111/54 (01/30 2024) Pulse Rate: 97 (01/30 2024)  Labs: Recent Labs    10/30/24 2018 10/31/24 0421 10/31/24 0550 10/31/24 1000 10/31/24 2137  HGB 13.6  --  11.7*  --   --   HCT 42.4  --  36.7  --   --   PLT 243  --  206  --   --   LABPROT 13.9 14.7  --   --   --   INR 1.0 1.1  --   --   --   HEPARINUNFRC  --   --   --  >1.10* 0.21*  CREATININE 1.18* 0.92  --   --   --     Estimated Creatinine Clearance: 103.3 mL/min (by C-G formula based on SCr of 0.92 mg/dL).   Medical History: Past Medical History:  Diagnosis Date   Abscess 06/23/2021   Achilles tendonitis 01/16/2014   Overview:   Followed by [employer].  Treated with brace and diclofenac .   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  Follow-up with Dr. Zena.  Watch for opportunities to increase physical activity within neuromuscular capabilities.   Chronic back pain 02/13/2018   Chronic pain syndrome 02/02/2016   Closed fracture of sacrum with routine healing 12/22/2021   Clotting disorder    COVID-19 virus infection 06/23/2021   Depression    GERD (gastroesophageal reflux disease)    Herniated lumbar disc without myelopathy 10/02/2019   History of  bacterial endocarditis 07/05/2022   History of kidney stones    Hypertension    Insomnia 12/08/2021   Obesity    Pneumonia    Sleep apnea 06/25/2018   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   Transaminitis 06/23/2021   Urinary incontinence 06/03/2010   Vertebral osteomyelitis (HCC)     Medications:  Medications Prior to Admission  Medication Sig Dispense Refill Last Dose/Taking   amLODipine  (NORVASC ) 2.5 MG tablet TAKE 1 TABLET BY MOUTH EVERY DAY 90 tablet 3 10/29/2024 at  9:00 PM   amoxicillin  (AMOXIL ) 500 MG capsule Take 2,000 mg by mouth as needed. Take once as needed prior to dental appointments.   Unknown   aspirin  EC 81 MG tablet Take 1 tablet (81 mg total) by mouth daily. Swallow whole. 30 tablet 11 10/22/2024   atorvastatin  (LIPITOR) 10 MG tablet TAKE 1 TABLET BY MOUTH EVERY DAY IN THE EVENING 90 tablet 3 10/29/2024 Evening   cetirizine  (ZYRTEC ) 10 MG tablet Take 10 mg by mouth at bedtime.   Taking   ELDERBERRY PO Take 50 mg by mouth daily.   10/29/2024 Morning   escitalopram  (LEXAPRO ) 20 MG tablet TAKE 1 TABLET BY MOUTH EVERY DAY 90 tablet 1 10/29/2024 Evening   fluticasone  (  FLONASE ) 50 MCG/ACT nasal spray SPRAY 2 SPRAYS INTO EACH NOSTRIL EVERY DAY 16 mL 6 10/29/2024 Bedtime   furosemide  (LASIX ) 20 MG tablet Take 1 tablet (20 mg total) by mouth daily. 90 tablet 3 Past Month   gabapentin  (NEURONTIN ) 100 MG capsule Take 200 mg by mouth 3 (three) times daily.   10/30/2024 at  9:30 AM   HYDROcodone -acetaminophen  (NORCO/VICODIN) 5-325 MG tablet Take 1 tablet by mouth every 6 (six) hours as needed for moderate pain (pain score 4-6). 20 tablet 0 10/29/2024 Evening   loperamide  (IMODIUM ) 2 MG capsule Take 1 capsule (2 mg total) by mouth as needed for diarrhea or loose stools. 30 capsule 0 10/27/2024   LORazepam  (ATIVAN ) 1 MG tablet Take 1 tablet (1 mg total) by mouth every 8 (eight) hours. (Patient taking differently: Take 1 mg by mouth every 8 (eight) hours as needed for anxiety or sedation.) 2 tablet 0  Past Month   Magnesium  250 MG CAPS Take 250 mg by mouth daily as needed (feet burning).   Past Week   metoprolol  tartrate (LOPRESSOR ) 25 MG tablet Take 0.5 tablets (12.5 mg total) by mouth 2 (two) times daily. 90 tablet 3 10/30/2024 Morning   Multiple Vitamin (MULTIVITAMIN WITH MINERALS) TABS tablet Take 2 tablets by mouth in the morning.   10/29/2024 Morning   naproxen  (NAPROSYN ) 250 MG tablet Take 250 mg by mouth 2 (two) times daily.   10/29/2024 Evening   oxyCODONE  (OXY IR/ROXICODONE ) 5 MG immediate release tablet Take 5 mg by mouth as directed.   Taking   pantoprazole  (PROTONIX ) 40 MG tablet TAKE 1 TABLET BY MOUTH EVERY DAY 90 tablet 3 10/30/2024 Morning   Potassium 99 MG TABS Take 198 mg by mouth daily as needed (when taking lasix ).   Past Month   tirzepatide  (ZEPBOUND ) 10 MG/0.5ML Pen Inject 10 mg into the skin once a week. 2 mL 0 Taking   [START ON 11/22/2024] tirzepatide  (ZEPBOUND ) 12.5 MG/0.5ML Pen Inject 12.5 mg into the skin once a week. 2 mL 0 Taking   [START ON 12/20/2024] tirzepatide  (ZEPBOUND ) 15 MG/0.5ML Pen Inject 15 mg into the skin once a week. 2 mL 0 Taking   tiZANidine  (ZANAFLEX ) 4 MG tablet Take 4 mg by mouth every 8 (eight) hours as needed.   10/29/2024 Bedtime   traMADol  (ULTRAM ) 50 MG tablet Take 1 tablet (50 mg total) by mouth every 6 (six) hours as needed. 20 tablet 0 10/29/2024 Evening   traMADol  (ULTRAM ) 50 MG tablet Take 1 tablet (50 mg total) by mouth 2 (two) times daily. 30 tablet 1 Taking   traMADol  (ULTRAM ) 50 MG tablet Take 1 tablet (50 mg total) by mouth every 6 (six) hours as needed. 30 tablet 0    vitamin C  (ASCORBIC ACID ) 250 MG tablet Take 500 mg by mouth daily.   10/29/2024 Morning   warfarin (COUMADIN ) 5 MG tablet TAKE 1/2 TO 1 TABLET DAILY OR AS DIRECTED BY COUMADIN  CLINIC 90 tablet 1 10/24/2024   aspirin  325 MG tablet Take 325 mg by mouth daily. (Patient not taking: Reported on 10/30/2024)   Not Taking    Assessment: Pharmacy consulted to dose warfarin and  heparin  in this 54 year old female presenting with respiratory failure.  Pt has hx of aortic valve replacement and Afib.   Pt was on warfarin 5 mg PO daily PTA but stopped taking on 1/23 in preparation for surgery on 1/28.  Pt was advised to resume warfarin on 1/29 at 1.5 times daily dose  X 2 days then with normal dose of 5 mg PO daily.     1/29 @ 2018:  INR = 1.0, SUBtherapeutic  CrCl = 80.5 ml/min.   Goal of Therapy:  INR 2-3 Heparin  level 0.3-0.7 units/ml Monitor platelets by anticoagulation protocol: Yes  Date: INR: Dose: 1/30 1.0 7.5mg (given~0200)   Date/Time: HL: Comment/Rate: 1/30@1000  >1.1 Supratherapeutic 1/30@2137  0.21 SUBtherapeutic  Plan:  Warfarin: - Will order warfarin 7.5 mg PO daily X 2 days starting on 1/30 @ ~ 0130 followed by warfarin 5 mg PO daily per instructions from outpt warfarin clinic.  - INR daily    Heparin : - Bolus 1400 units and increase rate to 1150 units/hr - Check HL 6 hrs after rate change - CBC daily  - will d/c heparin  bridge once INR is therapeutic X 2   Elsie CHRISTELLA Piety, PharmD Clinical Pharmacist 10/31/2024 10:18 PM     [1]  Allergies Allergen Reactions   Sulfur Itching   Baclofen Hives   Dilaudid  [Hydromorphone ] Itching   Latex     When inside the body it causes irritation  Other Reaction(s): Not available  latex   Oxycodone -Acetaminophen  Hives   Semaglutide  Nausea And Vomiting   Septra [Sulfamethoxazole-Trimethoprim] Hives   Sulfa Antibiotics Swelling   Vibramycin [Doxycycline] Itching   Gabapentin  Rash    Low dose okay per patient.

## 2024-11-01 ENCOUNTER — Observation Stay: Admit: 2024-11-01 | Discharge: 2024-11-01 | Disposition: A | Attending: Family Medicine

## 2024-11-01 DIAGNOSIS — F32A Depression, unspecified: Secondary | ICD-10-CM | POA: Diagnosis not present

## 2024-11-01 DIAGNOSIS — I10 Essential (primary) hypertension: Secondary | ICD-10-CM

## 2024-11-01 DIAGNOSIS — E66813 Obesity, class 3: Secondary | ICD-10-CM | POA: Diagnosis not present

## 2024-11-01 DIAGNOSIS — K5909 Other constipation: Secondary | ICD-10-CM | POA: Diagnosis not present

## 2024-11-01 DIAGNOSIS — K219 Gastro-esophageal reflux disease without esophagitis: Secondary | ICD-10-CM | POA: Diagnosis not present

## 2024-11-01 DIAGNOSIS — R0609 Other forms of dyspnea: Secondary | ICD-10-CM

## 2024-11-01 DIAGNOSIS — F419 Anxiety disorder, unspecified: Secondary | ICD-10-CM | POA: Diagnosis not present

## 2024-11-01 DIAGNOSIS — J9601 Acute respiratory failure with hypoxia: Secondary | ICD-10-CM | POA: Diagnosis not present

## 2024-11-01 DIAGNOSIS — I480 Paroxysmal atrial fibrillation: Secondary | ICD-10-CM | POA: Diagnosis not present

## 2024-11-01 DIAGNOSIS — R791 Abnormal coagulation profile: Principal | ICD-10-CM

## 2024-11-01 DIAGNOSIS — E785 Hyperlipidemia, unspecified: Secondary | ICD-10-CM | POA: Diagnosis not present

## 2024-11-01 DIAGNOSIS — M25511 Pain in right shoulder: Secondary | ICD-10-CM | POA: Insufficient documentation

## 2024-11-01 LAB — CBC
HCT: 34.5 % — ABNORMAL LOW (ref 36.0–46.0)
Hemoglobin: 11.2 g/dL — ABNORMAL LOW (ref 12.0–15.0)
MCH: 29.2 pg (ref 26.0–34.0)
MCHC: 32.5 g/dL (ref 30.0–36.0)
MCV: 90.1 fL (ref 80.0–100.0)
Platelets: 167 10*3/uL (ref 150–400)
RBC: 3.83 MIL/uL — ABNORMAL LOW (ref 3.87–5.11)
RDW: 15.9 % — ABNORMAL HIGH (ref 11.5–15.5)
WBC: 9.2 10*3/uL (ref 4.0–10.5)
nRBC: 0 % (ref 0.0–0.2)

## 2024-11-01 LAB — PROTIME-INR
INR: 1.2 (ref 0.8–1.2)
Prothrombin Time: 15.7 s — ABNORMAL HIGH (ref 11.4–15.2)

## 2024-11-01 LAB — HEPARIN LEVEL (UNFRACTIONATED)
Heparin Unfractionated: 0.42 [IU]/mL (ref 0.30–0.70)
Heparin Unfractionated: 0.46 [IU]/mL (ref 0.30–0.70)

## 2024-11-01 MED ORDER — WARFARIN SODIUM 7.5 MG PO TABS
7.5000 mg | ORAL_TABLET | Freq: Once | ORAL | Status: AC
  Administered 2024-11-01: 7.5 mg via ORAL
  Filled 2024-11-01: qty 1

## 2024-11-01 NOTE — Progress Notes (Signed)
 PHARMACY - ANTICOAGULATION CONSULT NOTE  Pharmacy Consult for Warfarin, Heparin   Indication: atrial fibrillation and mechanical valve (bridging with heparin  until INR therapeutic)   Allergies[1]  Patient Measurements: Height: 5' 7 (170.2 cm) Weight: (!) 141.5 kg (311 lb 15.2 oz) IBW/kg (Calculated) : 61.6 HEPARIN  DW (KG): 96.3  Vital Signs: Temp: 98.2 F (36.8 C) (01/31 0334) BP: 138/62 (01/31 0334) Pulse Rate: 82 (01/31 0334)  Labs: Recent Labs    10/30/24 2018 10/31/24 0421 10/31/24 0550 10/31/24 1000 10/31/24 2137 11/01/24 0901  HGB 13.6  --  11.7*  --   --  11.2*  HCT 42.4  --  36.7  --   --  34.5*  PLT 243  --  206  --   --  167  LABPROT 13.9 14.7  --   --   --  15.7*  INR 1.0 1.1  --   --   --  1.2  HEPARINUNFRC  --   --   --  >1.10* 0.21* 0.42  CREATININE 1.18* 0.92  --   --   --   --     Estimated Creatinine Clearance: 103.3 mL/min (by C-G formula based on SCr of 0.92 mg/dL).   Medical History: Past Medical History:  Diagnosis Date   Abscess 06/23/2021   Achilles tendonitis 01/16/2014   Overview:   Followed by [employer].  Treated with brace and diclofenac .   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  Follow-up with Dr. Zena.  Watch for opportunities to increase physical activity within neuromuscular capabilities.   Chronic back pain 02/13/2018   Chronic pain syndrome 02/02/2016   Closed fracture of sacrum with routine healing 12/22/2021   Clotting disorder    COVID-19 virus infection 06/23/2021   Depression    GERD (gastroesophageal reflux disease)    Herniated lumbar disc without myelopathy 10/02/2019    History of bacterial endocarditis 07/05/2022   History of kidney stones    Hypertension    Insomnia 12/08/2021   Obesity    Pneumonia    Sleep apnea 06/25/2018   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   Transaminitis 06/23/2021   Urinary incontinence 06/03/2010   Vertebral osteomyelitis (HCC)     Medications:  Medications Prior to Admission  Medication Sig Dispense Refill Last Dose/Taking   amLODipine  (NORVASC ) 2.5 MG tablet TAKE 1 TABLET BY MOUTH EVERY DAY 90 tablet 3 10/29/2024 at  9:00 PM   amoxicillin  (AMOXIL ) 500 MG capsule Take 2,000 mg by mouth as needed. Take once as needed prior to dental appointments.   Unknown   aspirin  EC 81 MG tablet Take 1 tablet (81 mg total) by mouth daily. Swallow whole. 30 tablet 11 10/22/2024   atorvastatin  (LIPITOR) 10 MG tablet TAKE 1 TABLET BY MOUTH EVERY DAY IN THE EVENING 90 tablet 3 10/29/2024 Evening   cetirizine  (ZYRTEC ) 10 MG tablet Take 10 mg by mouth at bedtime.   Taking   ELDERBERRY PO Take 50 mg by mouth daily.   10/29/2024 Morning   escitalopram  (LEXAPRO ) 20 MG tablet TAKE 1 TABLET BY MOUTH EVERY DAY 90 tablet  1 10/29/2024 Evening   fluticasone  (FLONASE ) 50 MCG/ACT nasal spray SPRAY 2 SPRAYS INTO EACH NOSTRIL EVERY DAY 16 mL 6 10/29/2024 Bedtime   furosemide  (LASIX ) 20 MG tablet Take 1 tablet (20 mg total) by mouth daily. 90 tablet 3 Past Month   gabapentin  (NEURONTIN ) 100 MG capsule Take 200 mg by mouth 3 (three) times daily.   10/30/2024 at  9:30 AM   HYDROcodone -acetaminophen  (NORCO/VICODIN) 5-325 MG tablet Take 1 tablet by mouth every 6 (six) hours as needed for moderate pain (pain score 4-6). 20 tablet 0 10/29/2024 Evening   loperamide  (IMODIUM ) 2 MG capsule Take 1 capsule (2 mg total) by mouth as needed for diarrhea or loose stools. 30 capsule 0 10/27/2024   LORazepam  (ATIVAN ) 1 MG tablet Take 1 tablet (1 mg total) by mouth every 8 (eight) hours. (Patient taking differently: Take 1 mg by mouth every 8 (eight) hours as needed for anxiety or  sedation.) 2 tablet 0 Past Month   Magnesium  250 MG CAPS Take 250 mg by mouth daily as needed (feet burning).   Past Week   metoprolol  tartrate (LOPRESSOR ) 25 MG tablet Take 0.5 tablets (12.5 mg total) by mouth 2 (two) times daily. 90 tablet 3 10/30/2024 Morning   Multiple Vitamin (MULTIVITAMIN WITH MINERALS) TABS tablet Take 2 tablets by mouth in the morning.   10/29/2024 Morning   naproxen  (NAPROSYN ) 250 MG tablet Take 250 mg by mouth 2 (two) times daily.   10/29/2024 Evening   oxyCODONE  (OXY IR/ROXICODONE ) 5 MG immediate release tablet Take 5 mg by mouth as directed.   Taking   pantoprazole  (PROTONIX ) 40 MG tablet TAKE 1 TABLET BY MOUTH EVERY DAY 90 tablet 3 10/30/2024 Morning   Potassium 99 MG TABS Take 198 mg by mouth daily as needed (when taking lasix ).   Past Month   tirzepatide  (ZEPBOUND ) 10 MG/0.5ML Pen Inject 10 mg into the skin once a week. 2 mL 0 Taking   [START ON 11/22/2024] tirzepatide  (ZEPBOUND ) 12.5 MG/0.5ML Pen Inject 12.5 mg into the skin once a week. 2 mL 0 Taking   [START ON 12/20/2024] tirzepatide  (ZEPBOUND ) 15 MG/0.5ML Pen Inject 15 mg into the skin once a week. 2 mL 0 Taking   tiZANidine  (ZANAFLEX ) 4 MG tablet Take 4 mg by mouth every 8 (eight) hours as needed.   10/29/2024 Bedtime   traMADol  (ULTRAM ) 50 MG tablet Take 1 tablet (50 mg total) by mouth every 6 (six) hours as needed. 20 tablet 0 10/29/2024 Evening   traMADol  (ULTRAM ) 50 MG tablet Take 1 tablet (50 mg total) by mouth 2 (two) times daily. 30 tablet 1 Taking   traMADol  (ULTRAM ) 50 MG tablet Take 1 tablet (50 mg total) by mouth every 6 (six) hours as needed. 30 tablet 0    vitamin C  (ASCORBIC ACID ) 250 MG tablet Take 500 mg by mouth daily.   10/29/2024 Morning   warfarin (COUMADIN ) 5 MG tablet TAKE 1/2 TO 1 TABLET DAILY OR AS DIRECTED BY COUMADIN  CLINIC 90 tablet 1 10/24/2024   aspirin  325 MG tablet Take 325 mg by mouth daily. (Patient not taking: Reported on 10/30/2024)   Not Taking    Assessment:Pharmacy consulted to dose  warfarin and heparin  in this 54 year old female presenting with respiratory failure.  Pt has hx of aortic valve replacement and Afib.   Pt was on warfarin 5 mg PO daily PTA but stopped taking on 1/23 in preparation for surgery on 1/28.  Pt was advised to resume warfarin on 1/29  at 1.5 times daily dose X 2 days then with normal dose of 5 mg PO daily.     Goal of Therapy:  INR 2-3 Heparin  level 0.3-0.7 units/ml Monitor platelets by anticoagulation protocol: Yes  Plan:   warfarin: - Will order warfarin 7.5 mg PO daily X 1  - INR daily   heparin   heparin  level therapeutic x 1 - continue heparin  infusion at 1150 units/hr - recheck heparin  level in 6 hrs to confirm - CBC daily  - will d/c heparin  bridge once INR is therapeutic X 2   Adriana JONETTA Bolster, PharmD Clinical Pharmacist 11/01/2024 9:49 AM      [1]  Allergies Allergen Reactions   Sulfur Itching   Baclofen Hives   Dilaudid  [Hydromorphone ] Itching   Latex     When inside the body it causes irritation  Other Reaction(s): Not available  latex   Oxycodone -Acetaminophen  Hives   Semaglutide  Nausea And Vomiting   Septra [Sulfamethoxazole-Trimethoprim] Hives   Sulfa Antibiotics Swelling   Vibramycin [Doxycycline] Itching   Gabapentin  Rash    Low dose okay per patient.

## 2024-11-01 NOTE — Assessment & Plan Note (Signed)
 Postoperative pain medications.

## 2024-11-01 NOTE — Assessment & Plan Note (Signed)
 Patient on heparin  drip and pharmacy dosing Coumadin .  INR now up to 2.0.  Heparin  drip discontinued.  Can continue her usual dose of Coumadin  as outpatient.

## 2024-11-01 NOTE — Progress Notes (Signed)
" °  Echocardiogram 2D Echocardiogram has been performed.  Mary Chambers 11/01/2024, 9:22 PM "

## 2024-11-01 NOTE — Plan of Care (Signed)
  Problem: Education: Goal: Knowledge of General Education information will improve Description: Including pain rating scale, medication(s)/side effects and non-pharmacologic comfort measures Outcome: Progressing   Problem: Health Behavior/Discharge Planning: Goal: Ability to manage health-related needs will improve Outcome: Progressing   Problem: Clinical Measurements: Goal: Ability to maintain clinical measurements within normal limits will improve Outcome: Progressing   Problem: Nutrition: Goal: Adequate nutrition will be maintained Outcome: Progressing   Problem: Coping: Goal: Level of anxiety will decrease Outcome: Progressing   Problem: Pain Managment: Goal: General experience of comfort will improve and/or be controlled Outcome: Progressing   Problem: Safety: Goal: Ability to remain free from injury will improve Outcome: Progressing

## 2024-11-01 NOTE — Progress Notes (Signed)
 PHARMACY - ANTICOAGULATION CONSULT NOTE  Pharmacy Consult for Warfarin, Heparin   Indication: atrial fibrillation and mechanical valve (bridging with heparin  until INR therapeutic)   Allergies[1]  Patient Measurements: Height: 5' 7 (170.2 cm) Weight: (!) 141.5 kg (311 lb 15.2 oz) IBW/kg (Calculated) : 61.6 HEPARIN  DW (KG): 96.3  Vital Signs: Temp: 98.5 F (36.9 C) (01/31 1714) BP: 105/65 (01/31 1714) Pulse Rate: 109 (01/31 1714)  Labs: Recent Labs    10/30/24 2018 10/31/24 0421 10/31/24 0550 10/31/24 1000 10/31/24 2137 11/01/24 0901 11/01/24 1652  HGB 13.6  --  11.7*  --   --  11.2*  --   HCT 42.4  --  36.7  --   --  34.5*  --   PLT 243  --  206  --   --  167  --   LABPROT 13.9 14.7  --   --   --  15.7*  --   INR 1.0 1.1  --   --   --  1.2  --   HEPARINUNFRC  --   --   --    < > 0.21* 0.42 0.46  CREATININE 1.18* 0.92  --   --   --   --   --    < > = values in this interval not displayed.    Estimated Creatinine Clearance: 103.3 mL/min (by C-G formula based on SCr of 0.92 mg/dL).   Medical History: Past Medical History:  Diagnosis Date   Abscess 06/23/2021   Achilles tendonitis 01/16/2014   Overview:   Followed by [employer].  Treated with brace and diclofenac .   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  Follow-up with Dr. Zena.  Watch for opportunities to increase physical activity within neuromuscular capabilities.   Chronic back pain 02/13/2018   Chronic pain syndrome 02/02/2016   Closed fracture of sacrum with routine healing 12/22/2021   Clotting disorder    COVID-19 virus infection 06/23/2021   Depression     GERD (gastroesophageal reflux disease)    Herniated lumbar disc without myelopathy 10/02/2019   History of bacterial endocarditis 07/05/2022   History of kidney stones    Hypertension    Insomnia 12/08/2021   Obesity    Pneumonia    Sleep apnea 06/25/2018   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   Transaminitis 06/23/2021   Urinary incontinence 06/03/2010   Vertebral osteomyelitis (HCC)     Medications:  Medications Prior to Admission  Medication Sig Dispense Refill Last Dose/Taking   amLODipine  (NORVASC ) 2.5 MG tablet TAKE 1 TABLET BY MOUTH EVERY DAY 90 tablet 3 10/29/2024 at  9:00 PM   amoxicillin  (AMOXIL ) 500 MG capsule Take 2,000 mg by mouth as needed. Take once as needed prior to dental appointments.   Unknown   aspirin  EC 81 MG tablet Take 1 tablet (81 mg total) by mouth daily. Swallow whole. 30 tablet 11 10/22/2024   atorvastatin  (LIPITOR) 10 MG tablet TAKE 1 TABLET BY MOUTH EVERY DAY IN THE EVENING 90 tablet 3 10/29/2024 Evening   cetirizine  (ZYRTEC ) 10 MG tablet Take 10 mg by mouth  at bedtime.   Taking   ELDERBERRY PO Take 50 mg by mouth daily.   10/29/2024 Morning   escitalopram  (LEXAPRO ) 20 MG tablet TAKE 1 TABLET BY MOUTH EVERY DAY 90 tablet 1 10/29/2024 Evening   fluticasone  (FLONASE ) 50 MCG/ACT nasal spray SPRAY 2 SPRAYS INTO EACH NOSTRIL EVERY DAY 16 mL 6 10/29/2024 Bedtime   furosemide  (LASIX ) 20 MG tablet Take 1 tablet (20 mg total) by mouth daily. 90 tablet 3 Past Month   gabapentin  (NEURONTIN ) 100 MG capsule Take 200 mg by mouth 3 (three) times daily.   10/30/2024 at  9:30 AM   HYDROcodone -acetaminophen  (NORCO/VICODIN) 5-325 MG tablet Take 1 tablet by mouth every 6 (six) hours as needed for moderate pain (pain score 4-6). 20 tablet 0 10/29/2024 Evening   loperamide  (IMODIUM ) 2 MG capsule Take 1 capsule (2 mg total) by mouth as needed for diarrhea or loose stools. 30 capsule 0 10/27/2024   LORazepam  (ATIVAN ) 1 MG tablet Take 1 tablet (1 mg total) by mouth every 8 (eight) hours.  (Patient taking differently: Take 1 mg by mouth every 8 (eight) hours as needed for anxiety or sedation.) 2 tablet 0 Past Month   Magnesium  250 MG CAPS Take 250 mg by mouth daily as needed (feet burning).   Past Week   metoprolol  tartrate (LOPRESSOR ) 25 MG tablet Take 0.5 tablets (12.5 mg total) by mouth 2 (two) times daily. 90 tablet 3 10/30/2024 Morning   Multiple Vitamin (MULTIVITAMIN WITH MINERALS) TABS tablet Take 2 tablets by mouth in the morning.   10/29/2024 Morning   naproxen  (NAPROSYN ) 250 MG tablet Take 250 mg by mouth 2 (two) times daily.   10/29/2024 Evening   oxyCODONE  (OXY IR/ROXICODONE ) 5 MG immediate release tablet Take 5 mg by mouth as directed.   Taking   pantoprazole  (PROTONIX ) 40 MG tablet TAKE 1 TABLET BY MOUTH EVERY DAY 90 tablet 3 10/30/2024 Morning   Potassium 99 MG TABS Take 198 mg by mouth daily as needed (when taking lasix ).   Past Month   tirzepatide  (ZEPBOUND ) 10 MG/0.5ML Pen Inject 10 mg into the skin once a week. 2 mL 0 Taking   [START ON 11/22/2024] tirzepatide  (ZEPBOUND ) 12.5 MG/0.5ML Pen Inject 12.5 mg into the skin once a week. 2 mL 0 Taking   [START ON 12/20/2024] tirzepatide  (ZEPBOUND ) 15 MG/0.5ML Pen Inject 15 mg into the skin once a week. 2 mL 0 Taking   tiZANidine  (ZANAFLEX ) 4 MG tablet Take 4 mg by mouth every 8 (eight) hours as needed.   10/29/2024 Bedtime   traMADol  (ULTRAM ) 50 MG tablet Take 1 tablet (50 mg total) by mouth every 6 (six) hours as needed. 20 tablet 0 10/29/2024 Evening   traMADol  (ULTRAM ) 50 MG tablet Take 1 tablet (50 mg total) by mouth 2 (two) times daily. 30 tablet 1 Taking   traMADol  (ULTRAM ) 50 MG tablet Take 1 tablet (50 mg total) by mouth every 6 (six) hours as needed. 30 tablet 0    vitamin C  (ASCORBIC ACID ) 250 MG tablet Take 500 mg by mouth daily.   10/29/2024 Morning   warfarin (COUMADIN ) 5 MG tablet TAKE 1/2 TO 1 TABLET DAILY OR AS DIRECTED BY COUMADIN  CLINIC 90 tablet 1 10/24/2024   aspirin  325 MG tablet Take 325 mg by mouth daily.  (Patient not taking: Reported on 10/30/2024)   Not Taking    Assessment:Pharmacy consulted to dose warfarin and heparin  in this 54 year old female presenting with respiratory failure.  Pt has hx of aortic  valve replacement and Afib.   Pt was on warfarin 5 mg PO daily PTA but stopped taking on 1/23 in preparation for surgery on 1/28.  Pt was advised to resume warfarin on 1/29 at 1.5 times daily dose X 2 days then with normal dose of 5 mg PO daily.     Goal of Therapy:  INR 2-3 Heparin  level 0.3-0.7 units/ml Monitor platelets by anticoagulation protocol: Yes  Plan:   warfarin: - Will order warfarin 7.5 mg PO daily X 1  - INR daily   heparin   heparin  level therapeutic x 2 - continue heparin  infusion at 1150 units/hr - recheck heparin  level with AM labs - CBC daily  - will d/c heparin  bridge once INR is therapeutic X 2   Will M. Lenon, PharmD, BCPS Clinical Pharmacist 11/01/2024 5:30 PM      [1]  Allergies Allergen Reactions   Sulfur Itching   Baclofen Hives   Dilaudid  [Hydromorphone ] Itching   Latex     When inside the body it causes irritation  Other Reaction(s): Not available  latex   Oxycodone -Acetaminophen  Hives   Semaglutide  Nausea And Vomiting   Septra [Sulfamethoxazole-Trimethoprim] Hives   Sulfa Antibiotics Swelling   Vibramycin [Doxycycline] Itching   Gabapentin  Rash    Low dose okay per patient.

## 2024-11-01 NOTE — Assessment & Plan Note (Signed)
BMI 48.86

## 2024-11-01 NOTE — Progress Notes (Signed)
 " Progress Note   Patient: Mary Chambers FMW:[id number] DOB: [birth year] DOA: 10/30/2024     0 DOS: the patient was seen and examined on 11/01/2024   Brief hospital course: 54 y.o. Caucasian female with medical history significant for anxiety, depression, GERD, essential hypertension, OSA, Aortic valve replacement on Coumadin  who underwent right shoulder surgery for rotator cuff tear repair on 10/30/24 in [place] at the specialty center and around 6:30 PM started having dyspnea and fatigue.  She was found to be hypoxic and tachycardic upon arrival here in was brought to the emergency room on 2 L of O2 by nasal cannula.  She is not on any home O2.  She denies any history of COPD or interstitial lung disease or PE or DVT.   Work up in ED- 95 % on 2L, BNP-125, Trop- neg, WBC-15, CTA chest- no PE, Dilated ascending aorta measuring 4.3 cm, slightly larger than on the prior exam, without dissection.Recommend annual imaging followup by CTA or MRA.  1/30.  Subtherapeutic INR at 1.1 on heparin  drip and Coumadin  1/31.  Subtherapeutic INR at 1.2 on heparin  drip and Coumadin   Assessment and Plan: * Subtherapeutic international normalized ratio (INR) Patient on heparin  drip and pharmacy dosing Coumadin .  INR 1.2.  Since the patient has a mechanical aortic valve and was off Coumadin  for 5 days prior to surgery would rather send her home with a therapeutic level.  Acute respiratory failure with hypoxia (HCC) Off oxygen.  This problem has resolved.  Incentive spirometer.  CT scan of the chest does not show pulmonary embolism.  Paroxysmal atrial fibrillation [facility]) Pharmacy dosing Coumadin .  On heparin  drip.  On metoprolol .  Dyslipidemia On Lipitor  Anxiety and depression On Lexapro   Obesity, Class III, BMI 40-49.9 (morbid obesity) (HCC) BMI 48.86  Shoulder pain, right Postoperative pain medications.  GERD without esophagitis On Protonix   Essential hypertension On Norvasc  and  metoprolol         Subjective: Patient feels okay.  Initially came in with shortness of breath and bleeding from her shoulder.  Had recent surgery at outpatient center.  Physical Exam: Vitals:   10/31/24 2024 11/01/24 0014 11/01/24 0334 11/01/24 1205  BP: (!) 111/54 (!) 123/55 138/62 (!) 145/61  Pulse: 97 95 82 (!) 104  Resp:    18  Temp: 98.4 F (36.9 C) 98.3 F (36.8 C) 98.2 F (36.8 C) 99.2 F (37.3 C)  TempSrc: Oral     SpO2: 93% 91% 93% 96%  Weight:      Height:       Physical Exam HENT:     Head: Normocephalic.  Eyes:     General: Lids are normal.     Conjunctiva/sclera: Conjunctivae normal.  Cardiovascular:     Rate and Rhythm: Normal rate and regular rhythm.     Heart sounds: Normal heart sounds, S1 normal and S2 normal.  Pulmonary:     Breath sounds: No decreased breath sounds, wheezing, rhonchi or rales.  Abdominal:     Palpations: Abdomen is soft.     Tenderness: There is no abdominal tenderness.  Musculoskeletal:     Right lower leg: Swelling present.     Left lower leg: Swelling present.  Skin:    General: Skin is warm.     Findings: No rash.  Neurological:     Mental Status: She is alert and oriented to person, place, and time.     Data Reviewed: White blood count 9.2, hemoglobin 11.2, platelet count 167, INR 1.2  Disposition: Status is: Observation Since patient was off anticoagulation for 5 days prior to surgery and then came in with INR 1.1, will probably be best to send her home on a therapeutic dose of Coumadin .  Currently on heparin  bridge.  Planned Discharge Destination: Home    Time spent: 28 minutes  Author: Charlie Patterson, MD 11/01/2024 2:43 PM  For on call review [email or web address].  "

## 2024-11-01 NOTE — Assessment & Plan Note (Signed)
 On Norvasc  and metoprolol 

## 2024-11-02 ENCOUNTER — Encounter: Payer: Self-pay | Admitting: Family Medicine

## 2024-11-02 DIAGNOSIS — J9601 Acute respiratory failure with hypoxia: Secondary | ICD-10-CM | POA: Diagnosis not present

## 2024-11-02 DIAGNOSIS — E785 Hyperlipidemia, unspecified: Secondary | ICD-10-CM | POA: Diagnosis not present

## 2024-11-02 DIAGNOSIS — I480 Paroxysmal atrial fibrillation: Secondary | ICD-10-CM | POA: Diagnosis not present

## 2024-11-02 DIAGNOSIS — K5909 Other constipation: Secondary | ICD-10-CM

## 2024-11-02 DIAGNOSIS — R791 Abnormal coagulation profile: Secondary | ICD-10-CM | POA: Diagnosis not present

## 2024-11-02 DIAGNOSIS — K5900 Constipation, unspecified: Secondary | ICD-10-CM | POA: Insufficient documentation

## 2024-11-02 LAB — HEPARIN LEVEL (UNFRACTIONATED)
Heparin Unfractionated: 0.26 [IU]/mL — ABNORMAL LOW (ref 0.30–0.70)
Heparin Unfractionated: 0.2 [IU]/mL — ABNORMAL LOW (ref 0.30–0.70)
Heparin Unfractionated: 0.58 [IU]/mL (ref 0.30–0.70)

## 2024-11-02 LAB — PROTIME-INR
INR: 1.3 — ABNORMAL HIGH (ref 0.8–1.2)
Prothrombin Time: 17.1 s — ABNORMAL HIGH (ref 11.4–15.2)

## 2024-11-02 LAB — CBC
HCT: 37.7 % (ref 36.0–46.0)
Hemoglobin: 11.8 g/dL — ABNORMAL LOW (ref 12.0–15.0)
MCH: 27.8 pg (ref 26.0–34.0)
MCHC: 31.3 g/dL (ref 30.0–36.0)
MCV: 88.7 fL (ref 80.0–100.0)
Platelets: 234 10*3/uL (ref 150–400)
RBC: 4.25 MIL/uL (ref 3.87–5.11)
RDW: 15.7 % — ABNORMAL HIGH (ref 11.5–15.5)
WBC: 11.2 10*3/uL — ABNORMAL HIGH (ref 4.0–10.5)
nRBC: 0 % (ref 0.0–0.2)

## 2024-11-02 LAB — ECHOCARDIOGRAM COMPLETE
Area-P 1/2: 4.96 cm2
Height: 67 in
Weight: 4991.21 [oz_av]

## 2024-11-02 MED ORDER — METOPROLOL TARTRATE 25 MG PO TABS
25.0000 mg | ORAL_TABLET | Freq: Two times a day (BID) | ORAL | Status: DC
  Administered 2024-11-02 – 2024-11-04 (×4): 25 mg via ORAL
  Filled 2024-11-02 (×4): qty 1

## 2024-11-02 MED ORDER — DOCUSATE SODIUM 100 MG PO CAPS
200.0000 mg | ORAL_CAPSULE | Freq: Two times a day (BID) | ORAL | Status: DC
  Administered 2024-11-02 – 2024-11-04 (×5): 200 mg via ORAL
  Filled 2024-11-02 (×5): qty 2

## 2024-11-02 MED ORDER — DILTIAZEM HCL ER COATED BEADS 120 MG PO CP24
120.0000 mg | ORAL_CAPSULE | Freq: Every day | ORAL | Status: DC
  Administered 2024-11-02: 120 mg via ORAL
  Filled 2024-11-02 (×2): qty 1

## 2024-11-02 MED ORDER — HEPARIN BOLUS VIA INFUSION
1450.0000 [IU] | Freq: Once | INTRAVENOUS | Status: AC
  Administered 2024-11-02: 1450 [IU] via INTRAVENOUS
  Filled 2024-11-02: qty 1450

## 2024-11-02 MED ORDER — WARFARIN SODIUM 7.5 MG PO TABS
7.5000 mg | ORAL_TABLET | Freq: Once | ORAL | Status: AC
  Administered 2024-11-02: 7.5 mg via ORAL
  Filled 2024-11-02 (×2): qty 1

## 2024-11-02 NOTE — Plan of Care (Signed)

## 2024-11-02 NOTE — Progress Notes (Signed)
 " Progress Note   Patient: Mary Chambers FMW:[id number] DOB: August 04, 1971 DOA: 10/30/2024     0 DOS: the patient was seen and examined on 11/02/2024   Brief hospital course: 54 y.o. Caucasian female with medical history significant for anxiety, depression, GERD, essential hypertension, OSA, Aortic valve replacement on Coumadin  who underwent right shoulder surgery for rotator cuff tear repair on 10/30/24 in [place] at the specialty center and around 6:30 PM started having dyspnea and fatigue.  She was found to be hypoxic and tachycardic upon arrival here in was brought to the emergency room on 2 L of O2 by nasal cannula.  She is not on any home O2.  She denies any history of COPD or interstitial lung disease or PE or DVT.   Work up in ED- 95 % on 2L, BNP-125, Trop- neg, WBC-15, CTA chest- no PE, Dilated ascending aorta measuring 4.3 cm, slightly larger than on the prior exam, without dissection.Recommend annual imaging followup by CTA or MRA.  1/30.  Subtherapeutic INR at 1.1 on heparin  drip and Coumadin  1/31.  Subtherapeutic INR at 1.2 on heparin  drip and Coumadin  2/1.  INR subtherapeutic at 1.3  Assessment and Plan: * Subtherapeutic international normalized ratio (INR) Patient on heparin  drip and pharmacy dosing Coumadin .  INR 1.3.  Since the patient has a mechanical aortic valve and was off Coumadin  for 5 days prior to surgery would rather send her home with a therapeutic level.  Acute respiratory failure with hypoxia (HCC) Off oxygen.  This problem has resolved.  Incentive spirometer.  CT scan of the chest does not show pulmonary embolism.  Paroxysmal atrial fibrillation [facility]) Pharmacy dosing Coumadin .  On heparin  drip.  Increase metoprolol  to 25 mg twice a day.  Dyslipidemia On Lipitor  Anxiety and depression On Lexapro   Constipation Colace  Obesity, Class III, BMI 40-49.9 (morbid obesity) (HCC) BMI 48.86  Shoulder pain, right Postoperative pain medications.  GERD without  esophagitis On Protonix   Essential hypertension Increase metoprolol  to 25 mg twice a day and hold Norvasc         Subjective: Patient feels okay.  No shortness of breath.  INR still subtherapeutic at 1.3.  Physical Exam: Vitals:   11/01/24 1714 11/01/24 2130 11/02/24 0510 11/02/24 0802  BP: 105/65 124/77 128/63 118/71  Pulse: (!) 109 (!) 111 (!) 106 (!) 102  Resp: 18 16 20 18   Temp: 98.5 F (36.9 C) 98.7 F (37.1 C) 98.4 F (36.9 C) 98.2 F (36.8 C)  TempSrc:  Oral  Oral  SpO2: 94% 92% 91% 91%  Weight:      Height:       Physical Exam HENT:     Head: Normocephalic.  Eyes:     General: Lids are normal.     Conjunctiva/sclera: Conjunctivae normal.  Cardiovascular:     Rate and Rhythm: Regular rhythm. Tachycardia present.     Heart sounds: Normal heart sounds, S1 normal and S2 normal.  Pulmonary:     Breath sounds: No decreased breath sounds, wheezing, rhonchi or rales.  Abdominal:     Palpations: Abdomen is soft.     Tenderness: There is no abdominal tenderness.  Musculoskeletal:     Right lower leg: Swelling present.     Left lower leg: Swelling present.     Comments: Right arm in sling  Skin:    General: Skin is warm.     Findings: No rash.  Neurological:     Mental Status: She is alert and oriented to person,  place, and time.     Data Reviewed: White blood cell count 11.2, hemoglobin 11.8, platelet count 234  Disposition: Status is: Observation INR still subtherapeutic at 1.3.  Continue heparin  drip and Coumadin  dosing  Planned Discharge Destination: Home with Home Health    Time spent: 28 minutes  Author: Charlie Patterson, MD 11/02/2024 2:39 PM  For on call review [email or web address].  "

## 2024-11-02 NOTE — Assessment & Plan Note (Signed)
 Colace.  Patient was able to have a bowel movement.

## 2024-11-02 NOTE — Progress Notes (Signed)
 PHARMACY - ANTICOAGULATION CONSULT NOTE  Pharmacy Consult for Warfarin, Heparin   Indication: atrial fibrillation and mechanical valve (bridging with heparin  until INR therapeutic)   Allergies[1]  Patient Measurements: Height: 5' 7 (170.2 cm) Weight: (!) 141.5 kg (311 lb 15.2 oz) IBW/kg (Calculated) : 61.6 HEPARIN  DW (KG): 96.3  Vital Signs: Temp: 98.4 F (36.9 C) (02/01 0510) Temp Source: Oral (01/31 2130) BP: 128/63 (02/01 0510) Pulse Rate: 106 (02/01 0510)  Labs: Recent Labs    10/30/24 2018 10/31/24 0421 10/31/24 0550 10/31/24 1000 11/01/24 0901 11/01/24 1652 11/02/24 0433  HGB 13.6  --  11.7*  --  11.2*  --  11.8*  HCT 42.4  --  36.7  --  34.5*  --  37.7  PLT 243  --  206  --  167  --  234  LABPROT 13.9 14.7  --   --  15.7*  --  17.1*  INR 1.0 1.1  --   --  1.2  --  1.3*  HEPARINUNFRC  --   --   --    < > 0.42 0.46 0.26*  CREATININE 1.18* 0.92  --   --   --   --   --    < > = values in this interval not displayed.    Estimated Creatinine Clearance: 103.3 mL/min (by C-G formula based on SCr of 0.92 mg/dL).   Medical History: Past Medical History:  Diagnosis Date   Abscess 06/23/2021   Achilles tendonitis 01/16/2014   Overview:   Followed by [employer].  Treated with brace and diclofenac .   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  Follow-up with Dr. Zena.  Watch for opportunities to increase physical activity within neuromuscular capabilities.   Chronic back pain 02/13/2018   Chronic pain syndrome 02/02/2016   Closed fracture of sacrum with routine healing 12/22/2021   Clotting disorder    COVID-19 virus  infection 06/23/2021   Depression    GERD (gastroesophageal reflux disease)    Herniated lumbar disc without myelopathy 10/02/2019   History of bacterial endocarditis 07/05/2022   History of kidney stones    Hypertension    Insomnia 12/08/2021   Obesity    Pneumonia    Sleep apnea 06/25/2018   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   Transaminitis 06/23/2021   Urinary incontinence 06/03/2010   Vertebral osteomyelitis (HCC)     Medications:  Medications Prior to Admission  Medication Sig Dispense Refill Last Dose/Taking   amLODipine  (NORVASC ) 2.5 MG tablet TAKE 1 TABLET BY MOUTH EVERY DAY 90 tablet 3 10/29/2024 at  9:00 PM   amoxicillin  (AMOXIL ) 500 MG capsule Take 2,000 mg by mouth as needed. Take once as needed prior to dental appointments.   Unknown   aspirin  EC 81 MG tablet Take 1 tablet (81 mg total) by mouth daily. Swallow whole. 30 tablet 11 10/22/2024   atorvastatin  (LIPITOR) 10 MG tablet TAKE 1 TABLET BY MOUTH EVERY DAY IN THE EVENING 90 tablet 3 10/29/2024 Evening   cetirizine  (ZYRTEC ) 10 MG tablet Take 10 mg by mouth at bedtime.   Taking  ELDERBERRY PO Take 50 mg by mouth daily.   10/29/2024 Morning   escitalopram  (LEXAPRO ) 20 MG tablet TAKE 1 TABLET BY MOUTH EVERY DAY 90 tablet 1 10/29/2024 Evening   fluticasone  (FLONASE ) 50 MCG/ACT nasal spray SPRAY 2 SPRAYS INTO EACH NOSTRIL EVERY DAY 16 mL 6 10/29/2024 Bedtime   furosemide  (LASIX ) 20 MG tablet Take 1 tablet (20 mg total) by mouth daily. 90 tablet 3 Past Month   gabapentin  (NEURONTIN ) 100 MG capsule Take 200 mg by mouth 3 (three) times daily.   10/30/2024 at  9:30 AM   HYDROcodone -acetaminophen  (NORCO/VICODIN) 5-325 MG tablet Take 1 tablet by mouth every 6 (six) hours as needed for moderate pain (pain score 4-6). 20 tablet 0 10/29/2024 Evening   loperamide  (IMODIUM ) 2 MG capsule Take 1 capsule (2 mg total) by mouth as needed for diarrhea or loose stools. 30 capsule 0 10/27/2024   LORazepam  (ATIVAN ) 1 MG tablet Take 1 tablet (1 mg total)  by mouth every 8 (eight) hours. (Patient taking differently: Take 1 mg by mouth every 8 (eight) hours as needed for anxiety or sedation.) 2 tablet 0 Past Month   Magnesium  250 MG CAPS Take 250 mg by mouth daily as needed (feet burning).   Past Week   metoprolol  tartrate (LOPRESSOR ) 25 MG tablet Take 0.5 tablets (12.5 mg total) by mouth 2 (two) times daily. 90 tablet 3 10/30/2024 Morning   Multiple Vitamin (MULTIVITAMIN WITH MINERALS) TABS tablet Take 2 tablets by mouth in the morning.   10/29/2024 Morning   naproxen  (NAPROSYN ) 250 MG tablet Take 250 mg by mouth 2 (two) times daily.   10/29/2024 Evening   oxyCODONE  (OXY IR/ROXICODONE ) 5 MG immediate release tablet Take 5 mg by mouth as directed.   Taking   pantoprazole  (PROTONIX ) 40 MG tablet TAKE 1 TABLET BY MOUTH EVERY DAY 90 tablet 3 10/30/2024 Morning   Potassium 99 MG TABS Take 198 mg by mouth daily as needed (when taking lasix ).   Past Month   tirzepatide  (ZEPBOUND ) 10 MG/0.5ML Pen Inject 10 mg into the skin once a week. 2 mL 0 Taking   [START ON 11/22/2024] tirzepatide  (ZEPBOUND ) 12.5 MG/0.5ML Pen Inject 12.5 mg into the skin once a week. 2 mL 0 Taking   [START ON 12/20/2024] tirzepatide  (ZEPBOUND ) 15 MG/0.5ML Pen Inject 15 mg into the skin once a week. 2 mL 0 Taking   tiZANidine  (ZANAFLEX ) 4 MG tablet Take 4 mg by mouth every 8 (eight) hours as needed.   10/29/2024 Bedtime   traMADol  (ULTRAM ) 50 MG tablet Take 1 tablet (50 mg total) by mouth every 6 (six) hours as needed. 20 tablet 0 10/29/2024 Evening   traMADol  (ULTRAM ) 50 MG tablet Take 1 tablet (50 mg total) by mouth 2 (two) times daily. 30 tablet 1 Taking   traMADol  (ULTRAM ) 50 MG tablet Take 1 tablet (50 mg total) by mouth every 6 (six) hours as needed. 30 tablet 0    vitamin C  (ASCORBIC ACID ) 250 MG tablet Take 500 mg by mouth daily.   10/29/2024 Morning   warfarin (COUMADIN ) 5 MG tablet TAKE 1/2 TO 1 TABLET DAILY OR AS DIRECTED BY COUMADIN  CLINIC 90 tablet 1 10/24/2024   aspirin  325 MG tablet  Take 325 mg by mouth daily. (Patient not taking: Reported on 10/30/2024)   Not Taking    Assessment:Pharmacy consulted to dose warfarin and heparin  in this 54 year old female presenting with respiratory failure.  Pt has hx of aortic valve replacement and Afib.   Pt  was on warfarin 5 mg PO daily PTA but stopped taking on 1/23 in preparation for surgery on 1/28.  Pt was advised to resume warfarin on 1/29 at 1.5 times daily dose X 2 days then with normal dose of 5 mg PO daily.     Goal of Therapy:  INR 2-3 Heparin  level 0.3-0.7 units/ml Monitor platelets by anticoagulation protocol: Yes  Plan:   warfarin: - Will order warfarin 7.5 mg PO daily X 1  - INR daily   heparin    2/1: HL @ 0433 = 0.26, SUBtherapeutic - Will order heparin  1450 units IV X 1 bolus and increase drip rate to 1350 units/hr  - Recheck HL 6 hrs after rate change  - CBC daily  - will d/c heparin  bridge once INR is therapeutic X 2   Alegandra Sommers D Clinical Pharmacist 11/02/2024 5:39 AM       [1]  Allergies Allergen Reactions   Sulfur Itching   Baclofen Hives   Dilaudid  [Hydromorphone ] Itching   Latex     When inside the body it causes irritation  Other Reaction(s): Not available  latex   Oxycodone -Acetaminophen  Hives   Semaglutide  Nausea And Vomiting   Septra [Sulfamethoxazole-Trimethoprim] Hives   Sulfa Antibiotics Swelling   Vibramycin [Doxycycline] Itching   Gabapentin  Rash    Low dose okay per patient.

## 2024-11-02 NOTE — Evaluation (Signed)
 Physical Therapy Evaluation Patient Details Name: Mary Chambers MRN: [id number] DOB: [birth year] Today's Date: 11/02/2024  History of Present Illness  54 y.o. Caucasian female with medical history significant for anxiety, depression, GERD, essential hypertension, OSA, Aortic valve replacement on Coumadin  who underwent right shoulder surgery for rotator cuff tear repair on 10/30/24 in [place] at the specialty center and around 6:30 PM started having dyspnea and fatigue.  She was found to be hypoxic and tachycardic upon arrival here in was brought to the emergency room on 2 L  Clinical Impression  Pt generally not feeling well, but states I know I need to get up and do something as she has not walked more than ~10 ft since her RTC surgery 10/30/24.  She is starting to have more RTC pain and needed a lot of assist around the home from family - similar situation today as she needed mod/max A for all mobility and tolerated only brief time in standing at EOB and managed just a few side steps before needing to sit.  Pt very much wanting to go home, but does admit she is very weak and functionally limited.  Pt will benefit from continued PT to address functional limitations.     If plan is discharge home, recommend the following: Two people to help with walking and/or transfers;A lot of help with bathing/dressing/bathroom;Assistance with cooking/housework;Assist for transportation   Can travel by private vehicle   No    Equipment Recommendations BSC/3in1 (if going home)  Recommendations for Other Services       Functional Status Assessment Patient has had a recent decline in their functional status and demonstrates the ability to make significant improvements in function in a reasonable and predictable amount of time.     Precautions / Restrictions Precautions Precautions: Fall Required Braces or Orthoses: Sling Restrictions Weight Bearing Restrictions Per Provider Order: Yes RUE Weight  Bearing Per Provider Order: Non weight bearing Other Position/Activity Restrictions: recent R RTC repair      Mobility  Bed Mobility Overal bed mobility: Needs Assistance Bed Mobility: Supine to Sit, Sit to Supine     Supine to sit: Mod assist, Max assist Sit to supine: Mod assist, Max assist   General bed mobility comments: pt showed effort to attempt on her own getting LEs toward EOB but unable to elevate w/o significant assist, similarly heavy assist to get back to supine    Transfers Overall transfer level: Needs assistance Equipment used: Quad cane Transfers: Sit to/from Stand Sit to Stand: Mod assist, Max assist           General transfer comment: elevated bed, pt made 2 attempts to stand w/o assist, unsuccessful.  Ultimately needed mod+ assist to get to standing    Ambulation/Gait     Assistive device: Rolling walker (2 wheels), Quad cane         General Gait Details: no true gait, managed a few minA side steps along EOB before needing to sit  Stairs            Wheelchair Mobility     Tilt Bed    Modified Rankin (Stroke Patients Only)       Balance Overall balance assessment: Needs assistance Sitting-balance support: Single extremity supported Sitting balance-Leahy Scale: Good     Standing balance support: Single extremity supported Standing balance-Leahy Scale: Poor Standing balance comment: Pt with poor tolerance for standing EOB, some anxiety, some fatigue, some LE weakness/foot drop  Pertinent Vitals/Pain Pain Assessment Pain Assessment: 0-10 Pain Score: 7  Pain Location: R shoulder, 2/2 RTC repair    Home Living Family/patient expects to be discharged to:: Private residence Living Arrangements: Children Available Help at Discharge: Available 24 hours/day (son and daughter in law) Type of Home: Apartment Home Access: Level entry (curb in parking lot)       Home Layout: One level Home  Equipment: Cane - quad;Wheelchair - manual (plans to have family procure her w/c from alternate home site)      Prior Function Prior Level of Function : Independent/Modified Independent             Mobility Comments: Pt reports reguarly being out of the home but not necessarily walking a whole lot ADLs Comments: independent prior to RTC sx     Extremity/Trunk Assessment   Upper Extremity Assessment Upper Extremity Assessment: Generalized weakness (R UE in immob sling, L grossly WFL)    Lower Extremity Assessment Lower Extremity Assessment: Generalized weakness (grossly 3+ to 4-/5 t/o except L foot drop/no DF)       Communication   Communication Communication: No apparent difficulties    Cognition Arousal: Alert Behavior During Therapy: Anxious   PT - Cognitive impairments: No apparent impairments                         Following commands: Intact       Cueing Cueing Techniques: Verbal cues     General Comments General comments (skin integrity, edema, etc.): Pt hesitant to do too much, starting to have more equisite post-op pain and generally anxious about how she has been weak and struggled with mobility since recent surgery    Exercises     Assessment/Plan    PT Assessment Patient needs continued PT services  PT Problem List Decreased strength;Decreased range of motion;Decreased activity tolerance;Decreased balance;Decreased mobility;Decreased knowledge of use of DME;Decreased safety awareness;Cardiopulmonary status limiting activity;Pain       PT Treatment Interventions DME instruction;Gait training;Functional mobility training;Therapeutic activities;Neuromuscular re-education;Balance training;Therapeutic exercise;Patient/family education    PT Goals (Current goals can be found in the Care Plan section)  Acute Rehab PT Goals Patient Stated Goal: get moving well enough to go home PT Goal Formulation: With patient Time For Goal Achievement:  11/15/24 Potential to Achieve Goals: Fair    Frequency Min 2X/week     Co-evaluation               AM-PAC PT 6 Clicks Mobility  Outcome Measure Help needed turning from your back to your side while in a flat bed without using bedrails?: A Lot Help needed moving from lying on your back to sitting on the side of a flat bed without using bedrails?: A Lot Help needed moving to and from a bed to a chair (including a wheelchair)?: A Lot Help needed standing up from a chair using your arms (e.g., wheelchair or bedside chair)?: A Lot Help needed to walk in hospital room?: Total Help needed climbing 3-5 steps with a railing? : Total 6 Click Score: 10    End of Session Equipment Utilized During Treatment: Gait belt Activity Tolerance: Patient limited by fatigue;Patient limited by pain Patient left: with bed alarm set;with call bell/phone within reach Nurse Communication: Mobility status PT Visit Diagnosis: Muscle weakness (generalized) (M62.81);Difficulty in walking, not elsewhere classified (R26.2);Unsteadiness on feet (R26.81);Pain Pain - Right/Left: Right Pain - part of body: Shoulder    Time: 8363-8284 PT Time Calculation (min) (  ACUTE ONLY): 39 min   Charges:   PT Evaluation $PT Eval Moderate Complexity: 1 Mod PT Treatments $Therapeutic Activity: 8-22 mins PT General Charges $$ ACUTE PT VISIT: 1 Visit         Carmin JONELLE Deed, DPT 11/02/2024, 5:40 PM

## 2024-11-02 NOTE — Progress Notes (Signed)
 PHARMACY - ANTICOAGULATION CONSULT NOTE  Pharmacy Consult for Warfarin, Heparin   Indication: atrial fibrillation and mechanical valve (bridging with heparin  until INR therapeutic)   Allergies[1]  Patient Measurements: Height: 5' 7 (170.2 cm) Weight: (!) 141.5 kg (311 lb 15.2 oz) IBW/kg (Calculated) : 61.6 HEPARIN  DW (KG): 96.3  Vital Signs: Temp: 99.9 F (37.7 C) (02/01 1944) Temp Source: Oral (02/01 1944) BP: 106/72 (02/01 2128) Pulse Rate: 131 (02/01 2128)  Labs: Recent Labs     0000 10/31/24 0421 10/31/24 0550 10/31/24 1000 11/01/24 0901 11/01/24 1652 11/02/24 0433 11/02/24 1135 11/02/24 2049  HGB   < >  --  11.7*  --  11.2*  --  11.8*  --   --   HCT  --   --  36.7  --  34.5*  --  37.7  --   --   PLT  --   --  206  --  167  --  234  --   --   LABPROT  --  14.7  --   --  15.7*  --  17.1*  --   --   INR  --  1.1  --   --  1.2  --  1.3*  --   --   HEPARINUNFRC  --   --   --    < > 0.42   < > 0.26* 0.20* 0.58  CREATININE  --  0.92  --   --   --   --   --   --   --    < > = values in this interval not displayed.    Estimated Creatinine Clearance: 103.3 mL/min (by C-G formula based on SCr of 0.92 mg/dL).   Medical History: Past Medical History:  Diagnosis Date   Abscess 06/23/2021   Achilles tendonitis 01/16/2014   Overview:   Followed by [employer].  Treated with brace and diclofenac .   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  Follow-up with Dr. Zena.  Watch for opportunities to increase physical activity within neuromuscular capabilities.   Chronic back pain 02/13/2018   Chronic pain syndrome 02/02/2016   Closed  fracture of sacrum with routine healing 12/22/2021   Clotting disorder    COVID-19 virus infection 06/23/2021   Depression    GERD (gastroesophageal reflux disease)    Herniated lumbar disc without myelopathy 10/02/2019   History of bacterial endocarditis 07/05/2022   History of kidney stones    Hypertension    Insomnia 12/08/2021   Obesity    Pneumonia    Sleep apnea 06/25/2018   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   Transaminitis 06/23/2021   Urinary incontinence 06/03/2010   Vertebral osteomyelitis (HCC)     Medications:  Medications Prior to Admission  Medication Sig Dispense Refill Last Dose/Taking   amLODipine  (NORVASC ) 2.5 MG tablet TAKE 1 TABLET BY MOUTH EVERY DAY 90 tablet 3 10/29/2024 at  9:00 PM   amoxicillin  (AMOXIL ) 500 MG capsule Take 2,000 mg by mouth as needed. Take once as needed prior to dental appointments.   Unknown   aspirin   EC 81 MG tablet Take 1 tablet (81 mg total) by mouth daily. Swallow whole. 30 tablet 11 10/22/2024   atorvastatin  (LIPITOR) 10 MG tablet TAKE 1 TABLET BY MOUTH EVERY DAY IN THE EVENING 90 tablet 3 10/29/2024 Evening   cetirizine  (ZYRTEC ) 10 MG tablet Take 10 mg by mouth at bedtime.   Taking   ELDERBERRY PO Take 50 mg by mouth daily.   10/29/2024 Morning   escitalopram  (LEXAPRO ) 20 MG tablet TAKE 1 TABLET BY MOUTH EVERY DAY 90 tablet 1 10/29/2024 Evening   fluticasone  (FLONASE ) 50 MCG/ACT nasal spray SPRAY 2 SPRAYS INTO EACH NOSTRIL EVERY DAY 16 mL 6 10/29/2024 Bedtime   furosemide  (LASIX ) 20 MG tablet Take 1 tablet (20 mg total) by mouth daily. 90 tablet 3 Past Month   gabapentin  (NEURONTIN ) 100 MG capsule Take 200 mg by mouth 3 (three) times daily.   10/30/2024 at  9:30 AM   HYDROcodone -acetaminophen  (NORCO/VICODIN) 5-325 MG tablet Take 1 tablet by mouth every 6 (six) hours as needed for moderate pain (pain score 4-6). 20 tablet 0 10/29/2024 Evening   loperamide  (IMODIUM ) 2 MG capsule Take 1 capsule (2 mg total) by mouth as needed for diarrhea or loose  stools. 30 capsule 0 10/27/2024   LORazepam  (ATIVAN ) 1 MG tablet Take 1 tablet (1 mg total) by mouth every 8 (eight) hours. (Patient taking differently: Take 1 mg by mouth every 8 (eight) hours as needed for anxiety or sedation.) 2 tablet 0 Past Month   Magnesium  250 MG CAPS Take 250 mg by mouth daily as needed (feet burning).   Past Week   metoprolol  tartrate (LOPRESSOR ) 25 MG tablet Take 0.5 tablets (12.5 mg total) by mouth 2 (two) times daily. 90 tablet 3 10/30/2024 Morning   Multiple Vitamin (MULTIVITAMIN WITH MINERALS) TABS tablet Take 2 tablets by mouth in the morning.   10/29/2024 Morning   naproxen  (NAPROSYN ) 250 MG tablet Take 250 mg by mouth 2 (two) times daily.   10/29/2024 Evening   oxyCODONE  (OXY IR/ROXICODONE ) 5 MG immediate release tablet Take 5 mg by mouth as directed.   Taking   pantoprazole  (PROTONIX ) 40 MG tablet TAKE 1 TABLET BY MOUTH EVERY DAY 90 tablet 3 10/30/2024 Morning   Potassium 99 MG TABS Take 198 mg by mouth daily as needed (when taking lasix ).   Past Month   tirzepatide  (ZEPBOUND ) 10 MG/0.5ML Pen Inject 10 mg into the skin once a week. 2 mL 0 Taking   [START ON 11/22/2024] tirzepatide  (ZEPBOUND ) 12.5 MG/0.5ML Pen Inject 12.5 mg into the skin once a week. 2 mL 0 Taking   [START ON 12/20/2024] tirzepatide  (ZEPBOUND ) 15 MG/0.5ML Pen Inject 15 mg into the skin once a week. 2 mL 0 Taking   tiZANidine  (ZANAFLEX ) 4 MG tablet Take 4 mg by mouth every 8 (eight) hours as needed.   10/29/2024 Bedtime   traMADol  (ULTRAM ) 50 MG tablet Take 1 tablet (50 mg total) by mouth every 6 (six) hours as needed. 20 tablet 0 10/29/2024 Evening   traMADol  (ULTRAM ) 50 MG tablet Take 1 tablet (50 mg total) by mouth 2 (two) times daily. 30 tablet 1 Taking   traMADol  (ULTRAM ) 50 MG tablet Take 1 tablet (50 mg total) by mouth every 6 (six) hours as needed. 30 tablet 0    vitamin C  (ASCORBIC ACID ) 250 MG tablet Take 500 mg by mouth daily.   10/29/2024 Morning   warfarin (COUMADIN ) 5 MG tablet TAKE 1/2 TO 1  TABLET DAILY OR AS DIRECTED BY COUMADIN   CLINIC 90 tablet 1 10/24/2024   aspirin  325 MG tablet Take 325 mg by mouth daily. (Patient not taking: Reported on 10/30/2024)   Not Taking    Assessment:Pharmacy consulted to dose warfarin and heparin  in this 54 year old female presenting with respiratory failure.  Pt has hx of aortic valve replacement and Afib.   Pt was on warfarin 5 mg PO daily PTA but stopped taking on 1/23 in preparation for surgery on 1/28.  Pt was advised to resume warfarin on 1/29 at 1.5 times daily dose X 2 days then with normal dose of 5 mg PO daily.     Heparin  assessment: 2/1: HL @ 0433 = 0.26, SUBtherapeutic 2/1: HL @ 1135 = 0.2, SUBtherapeutic 2/1: HL @ 2049 = 0.58, Therapeutic x 1  Warfarin assessment: Date INR Warfarin Dose  1/31 1.2 7.5 mg  2/1 1.3 7.5 mg        Goal of Therapy:  INR 2-3 Heparin  level 0.3-0.7 units/ml Monitor platelets by anticoagulation protocol: Yes  Plan:   warfarin: - INR subtherapeutic  - Will order warfarin 7.5 mg PO daily X 1 (1.5 x home dose) - Monitor INR daily   heparin    - HL therapeutic x 1 - continue heparin  infusion at 1550 units/hr  - Recheck HL in 6 hrs - CBC daily  - will d/c heparin  bridge once INR is therapeutic X 2   Will M. Lenon, PharmD, BCPS Clinical Pharmacist 11/02/2024 9:43 PM     [1]  Allergies Allergen Reactions   Sulfur Itching   Baclofen Hives   Dilaudid  [Hydromorphone ] Itching   Latex     When inside the body it causes irritation  Other Reaction(s): Not available  latex   Oxycodone -Acetaminophen  Hives   Semaglutide  Nausea And Vomiting   Septra [Sulfamethoxazole-Trimethoprim] Hives   Sulfa Antibiotics Swelling   Vibramycin [Doxycycline] Itching   Gabapentin  Rash    Low dose okay per patient.

## 2024-11-03 ENCOUNTER — Encounter (HOSPITAL_BASED_OUTPATIENT_CLINIC_OR_DEPARTMENT_OTHER): Payer: Self-pay | Admitting: Cardiology

## 2024-11-03 DIAGNOSIS — E785 Hyperlipidemia, unspecified: Secondary | ICD-10-CM | POA: Diagnosis not present

## 2024-11-03 DIAGNOSIS — I480 Paroxysmal atrial fibrillation: Secondary | ICD-10-CM | POA: Diagnosis not present

## 2024-11-03 DIAGNOSIS — J9601 Acute respiratory failure with hypoxia: Secondary | ICD-10-CM | POA: Diagnosis not present

## 2024-11-03 DIAGNOSIS — R791 Abnormal coagulation profile: Secondary | ICD-10-CM | POA: Diagnosis not present

## 2024-11-03 LAB — PROTIME-INR
INR: 1.7 — ABNORMAL HIGH (ref 0.8–1.2)
Prothrombin Time: 20.6 s — ABNORMAL HIGH (ref 11.4–15.2)

## 2024-11-03 LAB — CBC
HCT: 36.9 % (ref 36.0–46.0)
Hemoglobin: 11.9 g/dL — ABNORMAL LOW (ref 12.0–15.0)
MCH: 29 pg (ref 26.0–34.0)
MCHC: 32.2 g/dL (ref 30.0–36.0)
MCV: 90 fL (ref 80.0–100.0)
Platelets: 248 10*3/uL (ref 150–400)
RBC: 4.1 MIL/uL (ref 3.87–5.11)
RDW: 15.5 % (ref 11.5–15.5)
WBC: 11.8 10*3/uL — ABNORMAL HIGH (ref 4.0–10.5)
nRBC: 0 % (ref 0.0–0.2)

## 2024-11-03 LAB — HEPARIN LEVEL (UNFRACTIONATED): Heparin Unfractionated: 0.66 [IU]/mL (ref 0.30–0.70)

## 2024-11-03 MED ORDER — WARFARIN SODIUM 7.5 MG PO TABS
7.5000 mg | ORAL_TABLET | Freq: Once | ORAL | Status: AC
  Administered 2024-11-03: 7.5 mg via ORAL
  Filled 2024-11-03: qty 1

## 2024-11-03 MED ORDER — LACTULOSE 10 GM/15ML PO SOLN: 30.0000 g | Freq: Every day | ORAL | Status: DC | PRN

## 2024-11-03 MED ORDER — DILTIAZEM HCL ER COATED BEADS 180 MG PO CP24
180.0000 mg | ORAL_CAPSULE | Freq: Every day | ORAL | Status: DC
  Administered 2024-11-03 – 2024-11-04 (×2): 180 mg via ORAL
  Filled 2024-11-03 (×2): qty 1

## 2024-11-03 NOTE — Progress Notes (Signed)
 PHARMACY - ANTICOAGULATION CONSULT NOTE  Pharmacy Consult for Warfarin, Heparin   Indication: atrial fibrillation and mechanical valve (bridging with heparin  until INR therapeutic)   Allergies[1]  Patient Measurements: Height: 5' 7 (170.2 cm) Weight: (!) 141.5 kg (311 lb 15.2 oz) IBW/kg (Calculated) : 61.6 HEPARIN  DW (KG): 96.3  Vital Signs: Temp: 98.7 F (37.1 C) (02/02 0539) Temp Source: Oral (02/02 0539) BP: 101/79 (02/02 0539) Pulse Rate: 117 (02/02 0539)  Labs: Recent Labs    11/01/24 0901 11/01/24 1652 11/02/24 0433 11/02/24 1135 11/02/24 2049 11/03/24 0511  HGB 11.2*  --  11.8*  --   --  11.9*  HCT 34.5*  --  37.7  --   --  36.9  PLT 167  --  234  --   --  248  LABPROT 15.7*  --  17.1*  --   --  20.6*  INR 1.2  --  1.3*  --   --  1.7*  HEPARINUNFRC 0.42   < > 0.26* 0.20* 0.58 0.66   < > = values in this interval not displayed.    Estimated Creatinine Clearance: 103.3 mL/min (by C-G formula based on SCr of 0.92 mg/dL).   Medical History: Past Medical History:  Diagnosis Date   Abscess 06/23/2021   Achilles tendonitis 01/16/2014   Overview:   Followed by [employer].  Treated with brace and diclofenac .   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  Follow-up with Dr. Zena.  Watch for opportunities to increase physical activity within neuromuscular capabilities.   Chronic back pain 02/13/2018   Chronic pain syndrome 02/02/2016   Closed fracture of sacrum with routine healing 12/22/2021   Clotting disorder    COVID-19 virus infection 06/23/2021   Depression    GERD (gastroesophageal reflux disease)    Herniated  lumbar disc without myelopathy 10/02/2019   History of bacterial endocarditis 07/05/2022   History of kidney stones    Hypertension    Insomnia 12/08/2021   Obesity    Pneumonia    Sleep apnea 06/25/2018   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   Transaminitis 06/23/2021   Urinary incontinence 06/03/2010   Vertebral osteomyelitis (HCC)     Medications:  Medications Prior to Admission  Medication Sig Dispense Refill Last Dose/Taking   amLODipine  (NORVASC ) 2.5 MG tablet TAKE 1 TABLET BY MOUTH EVERY DAY 90 tablet 3 10/29/2024 at  9:00 PM   amoxicillin  (AMOXIL ) 500 MG capsule Take 2,000 mg by mouth as needed. Take once as needed prior to dental appointments.   Unknown   aspirin  EC 81 MG tablet Take 1 tablet (81 mg total) by mouth daily. Swallow whole. 30 tablet 11 10/22/2024   atorvastatin  (LIPITOR) 10 MG tablet TAKE 1 TABLET BY MOUTH EVERY DAY IN THE EVENING 90 tablet 3 10/29/2024 Evening   cetirizine  (ZYRTEC ) 10 MG tablet Take 10 mg by mouth at bedtime.   Taking   ELDERBERRY PO Take 50 mg by mouth daily.   10/29/2024 Morning   escitalopram  (LEXAPRO ) 20 MG tablet TAKE 1 TABLET BY MOUTH EVERY DAY 90 tablet 1 10/29/2024 Evening  fluticasone  (FLONASE ) 50 MCG/ACT nasal spray SPRAY 2 SPRAYS INTO EACH NOSTRIL EVERY DAY 16 mL 6 10/29/2024 Bedtime   furosemide  (LASIX ) 20 MG tablet Take 1 tablet (20 mg total) by mouth daily. 90 tablet 3 Past Month   gabapentin  (NEURONTIN ) 100 MG capsule Take 200 mg by mouth 3 (three) times daily.   10/30/2024 at  9:30 AM   HYDROcodone -acetaminophen  (NORCO/VICODIN) 5-325 MG tablet Take 1 tablet by mouth every 6 (six) hours as needed for moderate pain (pain score 4-6). 20 tablet 0 10/29/2024 Evening   loperamide  (IMODIUM ) 2 MG capsule Take 1 capsule (2 mg total) by mouth as needed for diarrhea or loose stools. 30 capsule 0 10/27/2024   LORazepam  (ATIVAN ) 1 MG tablet Take 1 tablet (1 mg total) by mouth every 8 (eight) hours. (Patient taking differently: Take 1 mg by mouth every 8  (eight) hours as needed for anxiety or sedation.) 2 tablet 0 Past Month   Magnesium  250 MG CAPS Take 250 mg by mouth daily as needed (feet burning).   Past Week   metoprolol  tartrate (LOPRESSOR ) 25 MG tablet Take 0.5 tablets (12.5 mg total) by mouth 2 (two) times daily. 90 tablet 3 10/30/2024 Morning   Multiple Vitamin (MULTIVITAMIN WITH MINERALS) TABS tablet Take 2 tablets by mouth in the morning.   10/29/2024 Morning   naproxen  (NAPROSYN ) 250 MG tablet Take 250 mg by mouth 2 (two) times daily.   10/29/2024 Evening   oxyCODONE  (OXY IR/ROXICODONE ) 5 MG immediate release tablet Take 5 mg by mouth as directed.   Taking   pantoprazole  (PROTONIX ) 40 MG tablet TAKE 1 TABLET BY MOUTH EVERY DAY 90 tablet 3 10/30/2024 Morning   Potassium 99 MG TABS Take 198 mg by mouth daily as needed (when taking lasix ).   Past Month   tirzepatide  (ZEPBOUND ) 10 MG/0.5ML Pen Inject 10 mg into the skin once a week. 2 mL 0 Taking   [START ON 11/22/2024] tirzepatide  (ZEPBOUND ) 12.5 MG/0.5ML Pen Inject 12.5 mg into the skin once a week. 2 mL 0 Taking   [START ON 12/20/2024] tirzepatide  (ZEPBOUND ) 15 MG/0.5ML Pen Inject 15 mg into the skin once a week. 2 mL 0 Taking   tiZANidine  (ZANAFLEX ) 4 MG tablet Take 4 mg by mouth every 8 (eight) hours as needed.   10/29/2024 Bedtime   traMADol  (ULTRAM ) 50 MG tablet Take 1 tablet (50 mg total) by mouth every 6 (six) hours as needed. 20 tablet 0 10/29/2024 Evening   traMADol  (ULTRAM ) 50 MG tablet Take 1 tablet (50 mg total) by mouth 2 (two) times daily. 30 tablet 1 Taking   traMADol  (ULTRAM ) 50 MG tablet Take 1 tablet (50 mg total) by mouth every 6 (six) hours as needed. 30 tablet 0    vitamin C  (ASCORBIC ACID ) 250 MG tablet Take 500 mg by mouth daily.   10/29/2024 Morning   warfarin (COUMADIN ) 5 MG tablet TAKE 1/2 TO 1 TABLET DAILY OR AS DIRECTED BY COUMADIN  CLINIC 90 tablet 1 10/24/2024   aspirin  325 MG tablet Take 325 mg by mouth daily. (Patient not taking: Reported on 10/30/2024)   Not Taking     Assessment:Pharmacy consulted to dose warfarin and heparin  in this 54 year old female presenting with respiratory failure.  Pt has hx of aortic valve replacement and Afib.   Pt was on warfarin 5 mg PO daily PTA but stopped taking on 1/23 in preparation for surgery on 1/28.  Pt was advised to resume warfarin on 1/29 at 1.5 times daily dose  X 2 days then with normal dose of 5 mg PO daily.     Heparin  assessment: 2/1: HL @ 0433 = 0.26, SUBtherapeutic 2/1: HL @ 1135 = 0.2, SUBtherapeutic 2/1: HL @ 2049 = 0.58, Therapeutic x 1 2/2: HL @ 0511 = 0.66, Therapeutic X 2   Warfarin assessment: Date INR Warfarin Dose  1/31 1.2 7.5 mg  2/1 1.3 7.5 mg  2/2 1.7 7.5 mg    Goal of Therapy:  INR 2-3 Heparin  level 0.3-0.7 units/ml Monitor platelets by anticoagulation protocol: Yes  Plan:   warfarin: - INR subtherapeutic again this morning - CBC stable - Will order warfarin 7.5 mg PO daily X 1 (1.5x home dose) - Monitor INR daily while inpatient  heparin    - HL therapeutic x 2  - continue heparin  infusion at 1550 units/hr  - Recheck HL on 2/3 with AM labs  - CBC daily  Plan to d/c heparin  bridge once INR is therapeutic X 2   Thank you for involving pharmacy in this patient's care.   Damien Napoleon, PharmD Clinical Pharmacist 11/03/2024 7:39 AM    [1]  Allergies Allergen Reactions   Sulfur Itching   Baclofen Hives   Dilaudid  [Hydromorphone ] Itching   Latex     When inside the body it causes irritation  Other Reaction(s): Not available  latex   Oxycodone -Acetaminophen  Hives   Semaglutide  Nausea And Vomiting   Septra [Sulfamethoxazole-Trimethoprim] Hives   Sulfa Antibiotics Swelling   Vibramycin [Doxycycline] Itching   Gabapentin  Rash    Low dose okay per patient.

## 2024-11-03 NOTE — Plan of Care (Signed)

## 2024-11-03 NOTE — Progress Notes (Signed)
 PHARMACY - ANTICOAGULATION CONSULT NOTE  Pharmacy Consult for Warfarin, Heparin   Indication: atrial fibrillation and mechanical valve (bridging with heparin  until INR therapeutic)   Allergies[1]  Patient Measurements: Height: 5' 7 (170.2 cm) Weight: (!) 141.5 kg (311 lb 15.2 oz) IBW/kg (Calculated) : 61.6 HEPARIN  DW (KG): 96.3  Vital Signs: Temp: 98.7 F (37.1 C) (02/02 0539) Temp Source: Oral (02/02 0539) BP: 101/79 (02/02 0539) Pulse Rate: 117 (02/02 0539)  Labs: Recent Labs    11/01/24 0901 11/01/24 1652 11/02/24 0433 11/02/24 1135 11/02/24 2049 11/03/24 0511  HGB 11.2*  --  11.8*  --   --  11.9*  HCT 34.5*  --  37.7  --   --  36.9  PLT 167  --  234  --   --  248  LABPROT 15.7*  --  17.1*  --   --  20.6*  INR 1.2  --  1.3*  --   --  1.7*  HEPARINUNFRC 0.42   < > 0.26* 0.20* 0.58 0.66   < > = values in this interval not displayed.    Estimated Creatinine Clearance: 103.3 mL/min (by C-G formula based on SCr of 0.92 mg/dL).   Medical History: Past Medical History:  Diagnosis Date   Abscess 06/23/2021   Achilles tendonitis 01/16/2014   Overview:   Followed by [employer].  Treated with brace and diclofenac .   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  Follow-up with Dr. Zena.  Watch for opportunities to increase physical activity within neuromuscular capabilities.   Chronic back pain 02/13/2018   Chronic pain syndrome 02/02/2016   Closed fracture of sacrum with routine healing 12/22/2021   Clotting disorder    COVID-19 virus infection 06/23/2021   Depression    GERD (gastroesophageal reflux disease)    Herniated  lumbar disc without myelopathy 10/02/2019   History of bacterial endocarditis 07/05/2022   History of kidney stones    Hypertension    Insomnia 12/08/2021   Obesity    Pneumonia    Sleep apnea 06/25/2018   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   Transaminitis 06/23/2021   Urinary incontinence 06/03/2010   Vertebral osteomyelitis (HCC)     Medications:  Medications Prior to Admission  Medication Sig Dispense Refill Last Dose/Taking   amLODipine  (NORVASC ) 2.5 MG tablet TAKE 1 TABLET BY MOUTH EVERY DAY 90 tablet 3 10/29/2024 at  9:00 PM   amoxicillin  (AMOXIL ) 500 MG capsule Take 2,000 mg by mouth as needed. Take once as needed prior to dental appointments.   Unknown   aspirin  EC 81 MG tablet Take 1 tablet (81 mg total) by mouth daily. Swallow whole. 30 tablet 11 10/22/2024   atorvastatin  (LIPITOR) 10 MG tablet TAKE 1 TABLET BY MOUTH EVERY DAY IN THE EVENING 90 tablet 3 10/29/2024 Evening   cetirizine  (ZYRTEC ) 10 MG tablet Take 10 mg by mouth at bedtime.   Taking   ELDERBERRY PO Take 50 mg by mouth daily.   10/29/2024 Morning   escitalopram  (LEXAPRO ) 20 MG tablet TAKE 1 TABLET BY MOUTH EVERY DAY 90 tablet 1 10/29/2024 Evening  fluticasone  (FLONASE ) 50 MCG/ACT nasal spray SPRAY 2 SPRAYS INTO EACH NOSTRIL EVERY DAY 16 mL 6 10/29/2024 Bedtime   furosemide  (LASIX ) 20 MG tablet Take 1 tablet (20 mg total) by mouth daily. 90 tablet 3 Past Month   gabapentin  (NEURONTIN ) 100 MG capsule Take 200 mg by mouth 3 (three) times daily.   10/30/2024 at  9:30 AM   HYDROcodone -acetaminophen  (NORCO/VICODIN) 5-325 MG tablet Take 1 tablet by mouth every 6 (six) hours as needed for moderate pain (pain score 4-6). 20 tablet 0 10/29/2024 Evening   loperamide  (IMODIUM ) 2 MG capsule Take 1 capsule (2 mg total) by mouth as needed for diarrhea or loose stools. 30 capsule 0 10/27/2024   LORazepam  (ATIVAN ) 1 MG tablet Take 1 tablet (1 mg total) by mouth every 8 (eight) hours. (Patient taking differently: Take 1 mg by mouth every 8  (eight) hours as needed for anxiety or sedation.) 2 tablet 0 Past Month   Magnesium  250 MG CAPS Take 250 mg by mouth daily as needed (feet burning).   Past Week   metoprolol  tartrate (LOPRESSOR ) 25 MG tablet Take 0.5 tablets (12.5 mg total) by mouth 2 (two) times daily. 90 tablet 3 10/30/2024 Morning   Multiple Vitamin (MULTIVITAMIN WITH MINERALS) TABS tablet Take 2 tablets by mouth in the morning.   10/29/2024 Morning   naproxen  (NAPROSYN ) 250 MG tablet Take 250 mg by mouth 2 (two) times daily.   10/29/2024 Evening   oxyCODONE  (OXY IR/ROXICODONE ) 5 MG immediate release tablet Take 5 mg by mouth as directed.   Taking   pantoprazole  (PROTONIX ) 40 MG tablet TAKE 1 TABLET BY MOUTH EVERY DAY 90 tablet 3 10/30/2024 Morning   Potassium 99 MG TABS Take 198 mg by mouth daily as needed (when taking lasix ).   Past Month   tirzepatide  (ZEPBOUND ) 10 MG/0.5ML Pen Inject 10 mg into the skin once a week. 2 mL 0 Taking   [START ON 11/22/2024] tirzepatide  (ZEPBOUND ) 12.5 MG/0.5ML Pen Inject 12.5 mg into the skin once a week. 2 mL 0 Taking   [START ON 12/20/2024] tirzepatide  (ZEPBOUND ) 15 MG/0.5ML Pen Inject 15 mg into the skin once a week. 2 mL 0 Taking   tiZANidine  (ZANAFLEX ) 4 MG tablet Take 4 mg by mouth every 8 (eight) hours as needed.   10/29/2024 Bedtime   traMADol  (ULTRAM ) 50 MG tablet Take 1 tablet (50 mg total) by mouth every 6 (six) hours as needed. 20 tablet 0 10/29/2024 Evening   traMADol  (ULTRAM ) 50 MG tablet Take 1 tablet (50 mg total) by mouth 2 (two) times daily. 30 tablet 1 Taking   traMADol  (ULTRAM ) 50 MG tablet Take 1 tablet (50 mg total) by mouth every 6 (six) hours as needed. 30 tablet 0    vitamin C  (ASCORBIC ACID ) 250 MG tablet Take 500 mg by mouth daily.   10/29/2024 Morning   warfarin (COUMADIN ) 5 MG tablet TAKE 1/2 TO 1 TABLET DAILY OR AS DIRECTED BY COUMADIN  CLINIC 90 tablet 1 10/24/2024   aspirin  325 MG tablet Take 325 mg by mouth daily. (Patient not taking: Reported on 10/30/2024)   Not Taking     Assessment:Pharmacy consulted to dose warfarin and heparin  in this 54 year old female presenting with respiratory failure.  Pt has hx of aortic valve replacement and Afib.   Pt was on warfarin 5 mg PO daily PTA but stopped taking on 1/23 in preparation for surgery on 1/28.  Pt was advised to resume warfarin on 1/29 at 1.5 times daily dose  X 2 days then with normal dose of 5 mg PO daily.     Heparin  assessment: 2/1: HL @ 0433 = 0.26, SUBtherapeutic 2/1: HL @ 1135 = 0.2, SUBtherapeutic 2/1: HL @ 2049 = 0.58, Therapeutic x 1 2/2: HL @ 0511 = 0.66, Therapeutic X 2   Warfarin assessment: Date INR Warfarin Dose  1/31 1.2 7.5 mg  2/1 1.3 7.5 mg        Goal of Therapy:  INR 2-3 Heparin  level 0.3-0.7 units/ml Monitor platelets by anticoagulation protocol: Yes  Plan:   warfarin: - INR subtherapeutic  - Will order warfarin 7.5 mg PO daily X 1 (1.5 x home dose) - Monitor INR daily   heparin    - HL therapeutic x 2  - continue heparin  infusion at 1550 units/hr  - Recheck HL on 2/3 with AM labs  - CBC daily  - will d/c heparin  bridge once INR is therapeutic X 2   Will M. Lenon, PharmD, BCPS Clinical Pharmacist 11/03/2024 6:34 AM      [1]  Allergies Allergen Reactions   Sulfur Itching   Baclofen Hives   Dilaudid  [Hydromorphone ] Itching   Latex     When inside the body it causes irritation  Other Reaction(s): Not available  latex   Oxycodone -Acetaminophen  Hives   Semaglutide  Nausea And Vomiting   Septra [Sulfamethoxazole-Trimethoprim] Hives   Sulfa Antibiotics Swelling   Vibramycin [Doxycycline] Itching   Gabapentin  Rash    Low dose okay per patient.

## 2024-11-04 ENCOUNTER — Telehealth

## 2024-11-04 DIAGNOSIS — R791 Abnormal coagulation profile: Secondary | ICD-10-CM | POA: Diagnosis not present

## 2024-11-04 DIAGNOSIS — R3989 Other symptoms and signs involving the genitourinary system: Secondary | ICD-10-CM

## 2024-11-04 DIAGNOSIS — J9601 Acute respiratory failure with hypoxia: Secondary | ICD-10-CM | POA: Diagnosis not present

## 2024-11-04 DIAGNOSIS — I480 Paroxysmal atrial fibrillation: Secondary | ICD-10-CM | POA: Diagnosis not present

## 2024-11-04 DIAGNOSIS — E785 Hyperlipidemia, unspecified: Secondary | ICD-10-CM | POA: Diagnosis not present

## 2024-11-04 LAB — PROTIME-INR
INR: 2 — ABNORMAL HIGH (ref 0.8–1.2)
Prothrombin Time: 23.9 s — ABNORMAL HIGH (ref 11.4–15.2)

## 2024-11-04 LAB — BASIC METABOLIC PANEL WITH GFR
Anion gap: 8 (ref 5–15)
BUN: 15 mg/dL (ref 6–20)
CO2: 31 mmol/L (ref 22–32)
Calcium: 8.6 mg/dL — ABNORMAL LOW (ref 8.9–10.3)
Chloride: 100 mmol/L (ref 98–111)
Creatinine, Ser: 0.79 mg/dL (ref 0.44–1.00)
GFR, Estimated: >60 mL/min
Glucose, Bld: 111 mg/dL — ABNORMAL HIGH (ref 70–99)
Potassium: 4.4 mmol/L (ref 3.5–5.1)
Sodium: 139 mmol/L (ref 135–145)

## 2024-11-04 LAB — HEPARIN LEVEL (UNFRACTIONATED): Heparin Unfractionated: 0.64 [IU]/mL (ref 0.30–0.70)

## 2024-11-04 MED ORDER — DOCUSATE SODIUM 100 MG PO CAPS: 100.0000 mg | ORAL_CAPSULE | Freq: Two times a day (BID) | ORAL | 0 refills | Status: AC | PRN

## 2024-11-04 MED ORDER — DILTIAZEM HCL ER COATED BEADS 180 MG PO CP24: 180.0000 mg | ORAL_CAPSULE | Freq: Every day | ORAL | 0 refills | Status: AC

## 2024-11-04 MED ORDER — WARFARIN SODIUM 5 MG PO TABS
5.0000 mg | ORAL_TABLET | Freq: Once | ORAL | Status: DC
  Filled 2024-11-04: qty 1

## 2024-11-04 MED ORDER — METOPROLOL TARTRATE 25 MG PO TABS: 25.0000 mg | ORAL_TABLET | Freq: Two times a day (BID) | ORAL | 0 refills | Status: AC

## 2024-11-04 NOTE — TOC Progression Note (Addendum)
 Transition of Care [facility]) - Progression Note    Patient Details  Name: Mary Chambers MRN: [id number] Date of Birth: Feb 08, 1971  Transition of Care [facility]) CM/SW Contact  Dalia GORMAN Fuse, RN Phone Number: 11/04/2024, 12:22 PM  Clinical Narrative:     Nursing added house supervisor to secure chat to request [employer] transportation to home. The patient can not get into the humvee due to her size, therefore she is not a candidate. TOC spoke with the patient's son Koren and he has agreed to transport the patient. Per Koren he will be outside the medical mall in 30 mins and can transport the patient home. TOC provided Koren with the phone number to call 1 call.  Nursing was updated and is concerned the patient's son wont be able to get the patient in the home. She can pivot and is 2 assist. TOC reached out to [employer]. [employer] searched the patient's home address and pulled up a picture of the home. The patient's driveway is gravel and their is no walkway to enter the home. The only surface is the gravel driveway and the grass, surfaces that can not be cleared.  TOC reached back out to the house supervisor to see if the [employer] guard could meet the patient at the home and assist with getting her into the home. The patient needs to be able to walk into the home for national guard assistance. The house supervisor advised that the local fire dept assisted with getting a patient into the home yesterday. TOC called the non-emergency number for [employer] and [employer] requesting a callback.  TOC made leadership aware of barriers to discharge.  12:35: TOC spoke with leadership at [employer]. They can assist with getting the patient inside the home. They will need to be dispatched to the home. [name] will need to call [facility] non-emergency communications line [phone] to request assistance.  TOC spoke with the patient's son Koren and provided instructions  to call the [facility] non-emergency communications line at [phone] to request assistance with getting his mom inside the home if needed.  TOC will continue to follow.  Expected Discharge Plan: Home w Home Health Services Barriers to Discharge: Continued Medical Work up               Expected Discharge Plan and Services   Discharge Planning Services: CM Consult Post Acute Care Choice: Home Health Living arrangements for the past 2 months: Single Family Home Expected Discharge Date: 11/04/24                         HH Arranged: PT, OT           Social Drivers of Health (SDOH) Interventions SDOH Screenings   Food Insecurity: Unknown (10/31/2024)  Housing: Low Risk (10/31/2024)  Transportation Needs: No Transportation Needs (10/31/2024)  Utilities: Not At Risk (10/31/2024)  Alcohol Screen: Low Risk (07/25/2024)  Depression (PHQ2-9): Low Risk (07/29/2024)  Financial Resource Strain: Medium Risk (07/25/2024)  Physical Activity: Inactive (07/25/2024)  Social Connections: Moderately Integrated (07/25/2024)  Stress: Stress Concern Present (07/25/2024)  Tobacco Use: Low Risk (11/02/2024)  Health Literacy: Adequate Health Literacy (10/18/2023)    Readmission Risk Interventions     No data to display

## 2024-11-04 NOTE — Progress Notes (Signed)
 Narrative for 3 in 1 [facility]  The patient requires the use of a 3 in 1 BSC due to significant mobility and safety limitations. The patient is unable to ambulate safely to the bathroom because of advanced age, weakness, and fall risk. Transferring on and off a standard toilet presents a hazard, as the patient demonstrated decreased balance, endurance and strength.

## 2024-11-04 NOTE — TOC Progression Note (Signed)
 Transition of Care [facility]) - Progression Note    Patient Details  Name: Mary Chambers MRN: [id number] Date of Birth: [birth year]  Transition of Care [facility]) CM/SW Contact  Dalia GORMAN Fuse, RN Phone Number: 11/04/2024, 9:41 AM  Clinical Narrative:     Patient does not have a preference for home health. Offers received from [facility] and [facility] in that order. TOC selected [facility] bc they were the first to respond. Orders needed for [facility] PT/OT and 3 in 1 BSC.  Expected Discharge Plan: Home w Home Health Services Barriers to Discharge: Continued Medical Work up               Expected Discharge Plan and Services   Discharge Planning Services: CM Consult Post Acute Care Choice: Home Health Living arrangements for the past 2 months: Single Family Home                           HH Arranged: PT, OT           Social Drivers of Health (SDOH) Interventions SDOH Screenings   Food Insecurity: Unknown (10/31/2024)  Housing: Low Risk (10/31/2024)  Transportation Needs: No Transportation Needs (10/31/2024)  Utilities: Not At Risk (10/31/2024)  Alcohol Screen: Low Risk (07/25/2024)  Depression (PHQ2-9): Low Risk (07/29/2024)  Financial Resource Strain: Medium Risk (07/25/2024)  Physical Activity: Inactive (07/25/2024)  Social Connections: Moderately Integrated (07/25/2024)  Stress: Stress Concern Present (07/25/2024)  Tobacco Use: Low Risk (11/02/2024)  Health Literacy: Adequate Health Literacy (10/18/2023)    Readmission Risk Interventions     No data to display

## 2024-11-04 NOTE — Care Management Obs Status (Signed)
 MEDICARE OBSERVATION STATUS NOTIFICATION   Patient Details  Name: Mary Chambers MRN: [id number] Date of Birth: 1971-09-01   Medicare Observation Status Notification Given:  Yes    Pepe Mineau 11/04/2024, 11:26 AM

## 2024-11-05 ENCOUNTER — Ambulatory Visit: Admitting: Podiatry

## 2024-11-05 ENCOUNTER — Telehealth: Payer: Self-pay | Admitting: *Deleted

## 2024-11-05 ENCOUNTER — Ambulatory Visit

## 2024-11-05 MED ORDER — CEPHALEXIN 500 MG PO CAPS: 500.0000 mg | ORAL_CAPSULE | Freq: Two times a day (BID) | ORAL | 0 refills | Status: AC

## 2024-11-05 NOTE — Telephone Encounter (Signed)
 Received a voicemail from the patient stating she was discharged from the hospital on yesterday and would need to reschedule today's appt. Returned call to the patient and had to leave a message to call back so we can discuss another appt. Will await her to call back.

## 2024-11-05 NOTE — Progress Notes (Signed)

## 2024-11-06 ENCOUNTER — Telehealth: Payer: Self-pay | Admitting: Cardiology

## 2024-11-06 ENCOUNTER — Telehealth (HOSPITAL_BASED_OUTPATIENT_CLINIC_OR_DEPARTMENT_OTHER): Payer: Self-pay | Admitting: Orthopaedic Surgery

## 2024-11-06 ENCOUNTER — Telehealth: Payer: Self-pay | Admitting: *Deleted

## 2024-11-06 ENCOUNTER — Other Ambulatory Visit (HOSPITAL_BASED_OUTPATIENT_CLINIC_OR_DEPARTMENT_OTHER): Payer: Self-pay | Admitting: Orthopaedic Surgery

## 2024-11-06 MED ORDER — HYDROCODONE-ACETAMINOPHEN 5-325 MG PO TABS: 1.0000 | ORAL_TABLET | Freq: Four times a day (QID) | ORAL | 0 refills | Status: AC | PRN

## 2024-11-06 NOTE — Telephone Encounter (Signed)
 HH agency [facility] contacted me regarding this patient. Therapy is seeing her at home and asked if they could get a nurse visit. They stated the dressing looks awful that was done in the ER before discharge. They don't have aquacels, but could come out and do a new dry dressing if ok with you? Also, she asked about a tub transfer bench that I can order if you are ok with this as well? Thank you.

## 2024-11-06 NOTE — Telephone Encounter (Signed)
 Patient is having an allergic reactions to her meds and needs something different

## 2024-11-06 NOTE — Telephone Encounter (Signed)
 Spoke with patient regarding medications  Patient was recently admitted, Norvasc  d/c and Diltiazem  Rx'd  Advised to do as recommended at discharge and will forward to Dr Lonni for review. Will call back if any further recommendations

## 2024-11-06 NOTE — Telephone Encounter (Signed)
 Pt requesting a c/b to discuss her hospital visit and the multiple medication changes

## 2024-11-11 ENCOUNTER — Ambulatory Visit

## 2024-11-13 ENCOUNTER — Encounter (HOSPITAL_BASED_OUTPATIENT_CLINIC_OR_DEPARTMENT_OTHER): Admitting: Orthopaedic Surgery

## 2024-11-13 ENCOUNTER — Ambulatory Visit

## 2024-11-19 ENCOUNTER — Ambulatory Visit: Admitting: Podiatry

## 2024-12-10 ENCOUNTER — Encounter: Admitting: Family Medicine

## 2025-02-26 ENCOUNTER — Ambulatory Visit (HOSPITAL_BASED_OUTPATIENT_CLINIC_OR_DEPARTMENT_OTHER): Payer: Medicaid Other | Admitting: Certified Nurse Midwife
